# Patient Record
Sex: Female | Born: 1950
Health system: Southern US, Community
[De-identification: ages and names within clinical notes are randomized; demographics above are authoritative.]

## PROBLEM LIST (undated history)

## (undated) DIAGNOSIS — I2510 Atherosclerotic heart disease of native coronary artery without angina pectoris: Secondary | ICD-10-CM

## (undated) DIAGNOSIS — F329 Major depressive disorder, single episode, unspecified: Secondary | ICD-10-CM

## (undated) DIAGNOSIS — I10 Essential (primary) hypertension: Secondary | ICD-10-CM

## (undated) DIAGNOSIS — J449 Chronic obstructive pulmonary disease, unspecified: Secondary | ICD-10-CM

## (undated) DIAGNOSIS — K219 Gastro-esophageal reflux disease without esophagitis: Secondary | ICD-10-CM

## (undated) DIAGNOSIS — I509 Heart failure, unspecified: Secondary | ICD-10-CM

## (undated) DIAGNOSIS — R000 Tachycardia, unspecified: Secondary | ICD-10-CM

## (undated) DIAGNOSIS — F32A Depression, unspecified: Secondary | ICD-10-CM

## (undated) DIAGNOSIS — E1142 Type 2 diabetes mellitus with diabetic polyneuropathy: Secondary | ICD-10-CM

## (undated) DIAGNOSIS — E785 Hyperlipidemia, unspecified: Secondary | ICD-10-CM

## (undated) DIAGNOSIS — E7800 Pure hypercholesterolemia, unspecified: Secondary | ICD-10-CM

## (undated) DIAGNOSIS — R002 Palpitations: Secondary | ICD-10-CM

## (undated) DIAGNOSIS — R12 Heartburn: Secondary | ICD-10-CM

## (undated) DIAGNOSIS — F419 Anxiety disorder, unspecified: Secondary | ICD-10-CM

## (undated) DIAGNOSIS — G4733 Obstructive sleep apnea (adult) (pediatric): Secondary | ICD-10-CM

## (undated) DIAGNOSIS — M1990 Unspecified osteoarthritis, unspecified site: Secondary | ICD-10-CM

## (undated) DIAGNOSIS — G4730 Sleep apnea, unspecified: Secondary | ICD-10-CM

## (undated) DIAGNOSIS — R51 Headache: Secondary | ICD-10-CM

## (undated) DIAGNOSIS — I219 Acute myocardial infarction, unspecified: Secondary | ICD-10-CM

## (undated) DIAGNOSIS — Z86718 Personal history of other venous thrombosis and embolism: Secondary | ICD-10-CM

## (undated) HISTORY — DX: Palpitations: R00.2

## (undated) HISTORY — PX: ABDOMINAL HYSTERECTOMY: SHX81

## (undated) HISTORY — PX: OTHER SURGICAL HISTORY: SHX169

## (undated) HISTORY — PX: CORONARY ANGIOPLASTY WITH STENT PLACEMENT: SHX49

## (undated) HISTORY — DX: Depression, unspecified: F32.A

## (undated) HISTORY — DX: Atherosclerotic heart disease of native coronary artery without angina pectoris: I25.10

## (undated) HISTORY — DX: Heartburn: R12

## (undated) HISTORY — DX: Sleep apnea, unspecified: G47.30

## (undated) HISTORY — DX: Obstructive sleep apnea (adult) (pediatric): G47.33

## (undated) HISTORY — PX: CHOLECYSTECTOMY: SHX55

## (undated) HISTORY — DX: Pure hypercholesterolemia, unspecified: E78.00

## (undated) HISTORY — DX: Type 2 diabetes mellitus with diabetic polyneuropathy: E11.42

## (undated) HISTORY — DX: Acute myocardial infarction, unspecified: I21.9

## (undated) HISTORY — DX: Major depressive disorder, single episode, unspecified: F32.9

## (undated) HISTORY — DX: Hyperlipidemia, unspecified: E78.5

## (undated) HISTORY — DX: Anxiety disorder, unspecified: F41.9

## (undated) HISTORY — DX: Tachycardia, unspecified: R00.0

## (undated) HISTORY — DX: Personal history of other venous thrombosis and embolism: Z86.718

---

## 2003-11-02 ENCOUNTER — Ambulatory Visit (HOSPITAL_COMMUNITY): Admission: RE | Admit: 2003-11-02 | Discharge: 2003-11-02 | Payer: Self-pay | Admitting: Internal Medicine

## 2004-05-01 ENCOUNTER — Encounter: Admission: RE | Admit: 2004-05-01 | Discharge: 2004-05-01 | Payer: Self-pay | Admitting: Internal Medicine

## 2004-05-17 ENCOUNTER — Encounter: Admission: RE | Admit: 2004-05-17 | Discharge: 2004-05-17 | Payer: Self-pay | Admitting: Internal Medicine

## 2004-08-02 ENCOUNTER — Encounter: Admission: RE | Admit: 2004-08-02 | Discharge: 2004-08-02 | Payer: Self-pay | Admitting: Orthopedic Surgery

## 2004-08-08 ENCOUNTER — Ambulatory Visit (HOSPITAL_COMMUNITY): Admission: AD | Admit: 2004-08-08 | Discharge: 2004-08-10 | Payer: Self-pay | Admitting: Orthopedic Surgery

## 2004-08-14 ENCOUNTER — Encounter (HOSPITAL_COMMUNITY): Admission: RE | Admit: 2004-08-14 | Discharge: 2004-09-13 | Payer: Self-pay | Admitting: Orthopedic Surgery

## 2004-09-11 ENCOUNTER — Encounter (HOSPITAL_COMMUNITY): Admission: RE | Admit: 2004-09-11 | Discharge: 2004-10-11 | Payer: Self-pay | Admitting: Internal Medicine

## 2004-09-16 ENCOUNTER — Encounter (HOSPITAL_COMMUNITY): Admission: RE | Admit: 2004-09-16 | Discharge: 2004-10-16 | Payer: Self-pay | Admitting: Orthopedic Surgery

## 2004-10-11 ENCOUNTER — Encounter: Admission: RE | Admit: 2004-10-11 | Discharge: 2004-11-10 | Payer: Self-pay | Admitting: Internal Medicine

## 2004-10-18 ENCOUNTER — Encounter (HOSPITAL_COMMUNITY): Admission: RE | Admit: 2004-10-18 | Discharge: 2004-11-17 | Payer: Self-pay | Admitting: Orthopedic Surgery

## 2004-11-11 ENCOUNTER — Encounter (HOSPITAL_COMMUNITY): Admission: RE | Admit: 2004-11-11 | Discharge: 2004-12-11 | Payer: Self-pay | Admitting: Internal Medicine

## 2004-11-19 ENCOUNTER — Encounter (HOSPITAL_COMMUNITY): Admission: RE | Admit: 2004-11-19 | Discharge: 2004-12-14 | Payer: Self-pay | Admitting: Orthopedic Surgery

## 2004-12-16 ENCOUNTER — Encounter (HOSPITAL_COMMUNITY): Admission: RE | Admit: 2004-12-16 | Discharge: 2005-01-15 | Payer: Self-pay | Admitting: Internal Medicine

## 2004-12-16 ENCOUNTER — Encounter (HOSPITAL_COMMUNITY): Admission: RE | Admit: 2004-12-16 | Discharge: 2005-01-15 | Payer: Self-pay | Admitting: Orthopedic Surgery

## 2005-01-16 ENCOUNTER — Encounter (HOSPITAL_COMMUNITY): Admission: RE | Admit: 2005-01-16 | Discharge: 2005-02-15 | Payer: Self-pay | Admitting: Internal Medicine

## 2005-01-20 ENCOUNTER — Ambulatory Visit: Payer: Self-pay | Admitting: Internal Medicine

## 2005-01-20 ENCOUNTER — Encounter (HOSPITAL_COMMUNITY): Admission: RE | Admit: 2005-01-20 | Discharge: 2005-02-19 | Payer: Self-pay | Admitting: Orthopedic Surgery

## 2005-03-05 ENCOUNTER — Ambulatory Visit: Payer: Self-pay | Admitting: Internal Medicine

## 2005-04-16 ENCOUNTER — Ambulatory Visit: Payer: Self-pay | Admitting: Pain Medicine

## 2005-05-12 ENCOUNTER — Ambulatory Visit: Payer: Self-pay | Admitting: Pain Medicine

## 2005-06-24 ENCOUNTER — Ambulatory Visit: Payer: Self-pay | Admitting: Pain Medicine

## 2005-06-30 ENCOUNTER — Ambulatory Visit: Payer: Self-pay | Admitting: Internal Medicine

## 2005-07-15 ENCOUNTER — Ambulatory Visit: Payer: Self-pay | Admitting: Physician Assistant

## 2005-07-17 ENCOUNTER — Inpatient Hospital Stay (HOSPITAL_COMMUNITY): Admission: AD | Admit: 2005-07-17 | Discharge: 2005-07-18 | Payer: Self-pay | Admitting: Internal Medicine

## 2005-07-17 ENCOUNTER — Ambulatory Visit: Payer: Self-pay | Admitting: Internal Medicine

## 2005-07-17 ENCOUNTER — Ambulatory Visit: Payer: Self-pay | Admitting: Cardiology

## 2005-08-15 ENCOUNTER — Ambulatory Visit: Payer: Self-pay | Admitting: Physician Assistant

## 2005-08-28 ENCOUNTER — Ambulatory Visit: Payer: Self-pay | Admitting: Pain Medicine

## 2005-11-06 ENCOUNTER — Ambulatory Visit: Payer: Self-pay | Admitting: Internal Medicine

## 2005-12-29 ENCOUNTER — Ambulatory Visit: Payer: Self-pay | Admitting: Internal Medicine

## 2006-01-09 ENCOUNTER — Encounter (INDEPENDENT_AMBULATORY_CARE_PROVIDER_SITE_OTHER): Payer: Self-pay | Admitting: *Deleted

## 2006-01-09 ENCOUNTER — Ambulatory Visit: Payer: Self-pay | Admitting: Internal Medicine

## 2006-01-09 ENCOUNTER — Ambulatory Visit (HOSPITAL_COMMUNITY): Admission: RE | Admit: 2006-01-09 | Discharge: 2006-01-09 | Payer: Self-pay | Admitting: Internal Medicine

## 2006-03-10 ENCOUNTER — Ambulatory Visit: Payer: Self-pay | Admitting: Cardiology

## 2006-03-11 ENCOUNTER — Ambulatory Visit: Payer: Self-pay | Admitting: Cardiology

## 2006-03-11 ENCOUNTER — Inpatient Hospital Stay (HOSPITAL_COMMUNITY): Admission: AD | Admit: 2006-03-11 | Discharge: 2006-03-12 | Payer: Self-pay | Admitting: Cardiology

## 2006-04-10 ENCOUNTER — Ambulatory Visit: Payer: Self-pay | Admitting: Cardiology

## 2006-04-27 ENCOUNTER — Ambulatory Visit: Payer: Self-pay | Admitting: Cardiology

## 2006-05-20 ENCOUNTER — Ambulatory Visit: Payer: Self-pay | Admitting: Cardiology

## 2006-06-17 ENCOUNTER — Ambulatory Visit: Payer: Self-pay | Admitting: Physician Assistant

## 2006-08-18 ENCOUNTER — Ambulatory Visit: Payer: Self-pay | Admitting: Cardiology

## 2006-08-18 ENCOUNTER — Inpatient Hospital Stay (HOSPITAL_COMMUNITY): Admission: EM | Admit: 2006-08-18 | Discharge: 2006-08-20 | Payer: Self-pay | Admitting: Emergency Medicine

## 2006-09-03 ENCOUNTER — Ambulatory Visit: Payer: Self-pay | Admitting: Cardiology

## 2006-11-10 ENCOUNTER — Ambulatory Visit: Payer: Self-pay | Admitting: Cardiology

## 2006-11-24 ENCOUNTER — Ambulatory Visit: Payer: Self-pay | Admitting: Nurse Practitioner

## 2006-12-14 ENCOUNTER — Ambulatory Visit: Payer: Self-pay | Admitting: Cardiology

## 2006-12-15 ENCOUNTER — Encounter: Payer: Self-pay | Admitting: Cardiology

## 2007-01-04 ENCOUNTER — Encounter: Payer: Self-pay | Admitting: Physician Assistant

## 2007-01-20 ENCOUNTER — Ambulatory Visit: Payer: Self-pay | Admitting: Cardiology

## 2007-02-23 ENCOUNTER — Ambulatory Visit: Payer: Self-pay | Admitting: Cardiology

## 2007-02-23 ENCOUNTER — Encounter: Payer: Self-pay | Admitting: Physician Assistant

## 2007-04-29 ENCOUNTER — Inpatient Hospital Stay (HOSPITAL_COMMUNITY): Admission: RE | Admit: 2007-04-29 | Discharge: 2007-05-01 | Payer: Self-pay | Admitting: Orthopedic Surgery

## 2007-06-14 ENCOUNTER — Encounter: Admission: RE | Admit: 2007-06-14 | Discharge: 2007-07-22 | Payer: Self-pay | Admitting: Orthopedic Surgery

## 2007-07-15 ENCOUNTER — Ambulatory Visit: Payer: Self-pay | Admitting: Cardiology

## 2007-07-23 ENCOUNTER — Ambulatory Visit: Payer: Self-pay | Admitting: Cardiology

## 2007-07-23 ENCOUNTER — Encounter: Admission: RE | Admit: 2007-07-23 | Discharge: 2007-08-23 | Payer: Self-pay | Admitting: Orthopedic Surgery

## 2007-08-03 ENCOUNTER — Ambulatory Visit: Payer: Self-pay | Admitting: Cardiology

## 2007-10-06 ENCOUNTER — Encounter: Payer: Self-pay | Admitting: Cardiology

## 2007-10-11 ENCOUNTER — Encounter: Payer: Self-pay | Admitting: Cardiology

## 2007-12-06 ENCOUNTER — Encounter: Payer: Self-pay | Admitting: Cardiology

## 2008-05-04 ENCOUNTER — Ambulatory Visit: Payer: Self-pay | Admitting: Cardiology

## 2008-05-25 ENCOUNTER — Encounter: Payer: Self-pay | Admitting: Cardiology

## 2008-06-01 ENCOUNTER — Ambulatory Visit: Payer: Self-pay | Admitting: Cardiology

## 2008-11-08 DIAGNOSIS — R079 Chest pain, unspecified: Secondary | ICD-10-CM | POA: Insufficient documentation

## 2008-11-08 DIAGNOSIS — R0989 Other specified symptoms and signs involving the circulatory and respiratory systems: Secondary | ICD-10-CM | POA: Insufficient documentation

## 2008-11-08 DIAGNOSIS — R002 Palpitations: Secondary | ICD-10-CM | POA: Insufficient documentation

## 2008-11-08 DIAGNOSIS — I2510 Atherosclerotic heart disease of native coronary artery without angina pectoris: Secondary | ICD-10-CM | POA: Insufficient documentation

## 2008-11-09 ENCOUNTER — Encounter (INDEPENDENT_AMBULATORY_CARE_PROVIDER_SITE_OTHER): Payer: Self-pay | Admitting: *Deleted

## 2008-12-17 ENCOUNTER — Encounter: Payer: Self-pay | Admitting: Cardiology

## 2008-12-18 ENCOUNTER — Encounter: Payer: Self-pay | Admitting: Cardiology

## 2009-02-20 ENCOUNTER — Encounter: Payer: Self-pay | Admitting: Cardiology

## 2009-05-30 ENCOUNTER — Encounter: Payer: Self-pay | Admitting: Cardiology

## 2009-05-30 ENCOUNTER — Encounter: Payer: Self-pay | Admitting: Cardiovascular Disease

## 2009-07-18 ENCOUNTER — Encounter: Payer: Self-pay | Admitting: Cardiology

## 2009-07-26 ENCOUNTER — Encounter: Payer: Self-pay | Admitting: Cardiology

## 2009-08-30 ENCOUNTER — Encounter: Payer: Self-pay | Admitting: Cardiology

## 2009-08-30 ENCOUNTER — Telehealth (INDEPENDENT_AMBULATORY_CARE_PROVIDER_SITE_OTHER): Payer: Self-pay | Admitting: *Deleted

## 2009-09-04 ENCOUNTER — Encounter: Payer: Self-pay | Admitting: Physician Assistant

## 2009-09-04 ENCOUNTER — Ambulatory Visit: Payer: Self-pay | Admitting: Cardiology

## 2009-09-04 DIAGNOSIS — G4733 Obstructive sleep apnea (adult) (pediatric): Secondary | ICD-10-CM | POA: Insufficient documentation

## 2009-09-05 ENCOUNTER — Ambulatory Visit: Payer: Self-pay | Admitting: Cardiology

## 2009-09-05 ENCOUNTER — Encounter: Payer: Self-pay | Admitting: Cardiovascular Disease

## 2009-09-06 ENCOUNTER — Encounter: Payer: Self-pay | Admitting: Physician Assistant

## 2009-09-12 ENCOUNTER — Encounter: Payer: Self-pay | Admitting: Cardiology

## 2009-09-20 ENCOUNTER — Ambulatory Visit: Payer: Self-pay | Admitting: Physician Assistant

## 2009-09-20 DIAGNOSIS — E109 Type 1 diabetes mellitus without complications: Secondary | ICD-10-CM | POA: Insufficient documentation

## 2009-09-20 DIAGNOSIS — I5032 Chronic diastolic (congestive) heart failure: Secondary | ICD-10-CM | POA: Insufficient documentation

## 2009-09-20 DIAGNOSIS — E782 Mixed hyperlipidemia: Secondary | ICD-10-CM | POA: Insufficient documentation

## 2010-02-19 ENCOUNTER — Encounter: Payer: Self-pay | Admitting: Cardiology

## 2010-04-07 ENCOUNTER — Encounter: Payer: Self-pay | Admitting: Internal Medicine

## 2010-04-11 ENCOUNTER — Encounter
Admission: RE | Admit: 2010-04-11 | Discharge: 2010-04-16 | Payer: Self-pay | Source: Home / Self Care | Attending: Physical Medicine & Rehabilitation | Admitting: Physical Medicine & Rehabilitation

## 2010-04-15 ENCOUNTER — Ambulatory Visit
Admission: RE | Admit: 2010-04-15 | Discharge: 2010-04-15 | Payer: Self-pay | Source: Home / Self Care | Attending: Physical Medicine & Rehabilitation | Admitting: Physical Medicine & Rehabilitation

## 2010-04-17 ENCOUNTER — Telehealth (INDEPENDENT_AMBULATORY_CARE_PROVIDER_SITE_OTHER): Payer: Self-pay | Admitting: *Deleted

## 2010-04-17 NOTE — Letter (Signed)
Summary: [facility] D/C DR. [name]  [facility] D/C DR. [name]   Imported By: Zachary George 09/20/2009 12:55:59  _____________________________________________________________________  External Attachment:    Type:   Image     Comment:   External Document

## 2010-04-17 NOTE — Progress Notes (Signed)
Summary: change crestor to pravastatin  Phone Note Outgoing Call Call back at [facility] Phone [phone]   Call placed by: Carlye Grippe,  August 30, 2009 9:57 AM Call placed to: Patient Summary of Call: called and informed patient due to insurance requiring PA on crestor, MD Degent changed crestor 20mg  to pravastatin 80mg  Take 1 tablet by mouth once a day. New rx sent to [facility].  Initial call taken by: Carlye Grippe,  August 30, 2009 9:59 AM    New/Updated Medications: PRAVASTATIN SODIUM 80 MG TABS (PRAVASTATIN SODIUM) Take 1 tablet by mouth once a day Prescriptions: PRAVASTATIN SODIUM 80 MG TABS (PRAVASTATIN SODIUM) Take 1 tablet by mouth once a day  #30 x 6   Entered by:   Carlye Grippe   Authorized by:   Lewayne Bunting, MD, [facility]   Signed by:   Carlye Grippe on 08/30/2009   Method used:   Electronically to        [facility]* (retail)       [address]       [place], [place]  [address]       Ph: [phone]       Fax: [phone]   RxID:   [id number]

## 2010-04-17 NOTE — Consult Note (Signed)
Summary: CARDIOLOGY CONSULT/ [facility]  CARDIOLOGY CONSULT/ [facility]   Imported By: Zachary George 09/20/2009 12:54:58  _____________________________________________________________________  External Attachment:    Type:   Image     Comment:   External Document

## 2010-04-17 NOTE — Letter (Signed)
Summary: [facility] H[facility]P/ D/C DR. DHRUV [name]  [facility] H[facility]P/ D/C DR. DHRUV [name]   Imported By: Delfino Lovett 06/04/2009 11:00:36  _____________________________________________________________________  External Attachment:    Type:   Image     Comment:   External Document

## 2010-04-17 NOTE — Letter (Signed)
Summary: Appointment -missed  [facility]. [address] [place], [place] [address]   Phone: [phone]  Fax: [phone]     Jul 18, 2009 MRN: [id number]     [place], [place]  [address]     Dear Ms. ORAVEC,  [facility] records indicate you missed your appointment on Jul 18, 2009                        with Dr.  Dannielle Burn.   It is very important that we reach you to reschedule this appointment. We look forward to participating in your health care needs.   Please contact us at the number listed above at your earliest convenience to reschedule this appointment.   Sincerely,    Public relations account executive

## 2010-04-17 NOTE — Medication Information (Signed)
Summary: RX Folder/ [facility]  RX Folder/ [facility]   Imported By: Bartholomew Boards 08/30/2009 11:15:08  _____________________________________________________________________  External Attachment:    Type:   Image     Comment:   External Document

## 2010-04-17 NOTE — Assessment & Plan Note (Signed)
Summary: 6 MONTH FU   Visit Type:  Follow-up Primary Provider:  Woody Seller   History of Present Illness: the patient is a 60 year old female with insulin-dependent diabetes mellitus, dyslipidemia, fibromyalgia, obstructive sleep apnea and anxiety and depression.  The patient does have a history of coronary artery disease. She had a widely patent circumflex stent by catheterization in June of 2008. She has a possible history of coronary vasospasm. Her circumflex coronary artery stent was a Taxus stent placed in the setting of a non-ST elevation myocardial infarction. She also has a history of sinus tachycardia. She has a history of atypical chest pains which in the past have been felt to be secondary to reflux. She had a normal adenosine stress Cardiolite study a year ago with an ejection fraction of 59%. She has a long-standing history of palpitations.  The patient now reports over the last month or so she has felt a burning sensation in the chest particularly at night. However she also reports left-sided chest pain associated with diaphoresis and shortness of breath upon exertion. She has taken several nitroglycerin over the last week with improvement in her symptoms. Interestingly however on palpation of the left chest underneath her left breast the patient has tenderness. She also feels she has gained quite a bit of fluids. She has gained 9 pounds in the last week despite the fact that she takes diuretics. She feels extremely short of breath on minimal exertion with immediate onset of central chest pain. She gets dizzy when she gets up quickly.  The patient was very tachycardic in the office and I did her lung exam she started hyperventilating. We placed a nonrebreather mask on the patient's and her symptoms improved. Her EKG showed no acute ischemic changesand there was sinus tachycardia. Her pulse oximeter was within normal limits and she was hypertensive. The patient was given consecutively 2 doses of  nitroglycerin and 2 doses of p.o. metoprolol 25 mg.  According toher primary care physician the patient has cor pulmonale but this has never been documented  Preventive Screening-Counseling & Management  Alcohol-Tobacco     Smoking Status: never  Current Medications (verified): 1)  Diltiazem Hcl Er Beads 180 Mg Xr24h-Cap (Diltiazem Hcl Er Beads) .... Take One Capsule By Mouth Daily 2)  Omeprazole 20 Mg Cpdr (Omeprazole) .... Take 1 Tablet By Mouth Two Times A Day 3)  Potassium Chloride Cr 10 Meq Cr-Caps (Potassium Chloride) .... Take 1 Tablet By Mouth Two Times A Day 4)  Cymbalta 60 Mg Cpep (Duloxetine Hcl) .... Take 1 Tablet By Mouth Two Times A Day 5)  Aspirin 81 Mg Tbec (Aspirin) .... Take One Tablet By Mouth Daily 6)  Pravastatin Sodium 80 Mg Tabs (Pravastatin Sodium) .... Take 1 Tablet By Mouth Once A Day 7)  Plavix 75 Mg Tabs (Clopidogrel Bisulfate) .... Take One Tablet By Mouth Daily 8)  Creon 24000 Unit Cpep (Pancrelipase (Lip-Prot-Amyl)) .... Take 2 Tablet By Mouth Two Times A Day 9)  Alprazolam 1 Mg Tabs (Alprazolam) .... Take One Tablet Four Times A Day 10)  Premarin 1.25 Mg Tabs (Estrogens Conjugated) .... Take One Tablet By Mouth Once Daily 11)  Benazepril Hcl 20 Mg Tabs (Benazepril Hcl) .... Take 1 Tablet By Mouth Once A Day 12)  Zoloft 100 Mg Tabs (Sertraline Hcl) .... Take 1 Tablet By Mouth Once A Day 13)  Morphine Sulfate Cr 60 Mg Xr12h-Tab (Morphine Sulfate) .... Take 1 Tablet By Mouth Two Times A Day 14)  Hydrocodone-Acetaminophen 10-500 Mg Tabs (Hydrocodone-Acetaminophen) .Marland KitchenMarland KitchenMarland Kitchen  Take One Tablet By Mouth Four Times A Day 15)  Promethazine Hcl 25 Mg Tabs (Promethazine Hcl) .... As Needed 16)  Humalog 100 Unit/ml Soln (Insulin Lispro (Human)) .... Ss Before Meals As Needed 17)  Nitrostat 0.4 Mg Subl (Nitroglycerin) .... Dissolve One Under Tongue As Directed 18)  Torsemide 20 Mg Tabs (Torsemide) .... Take 2 Tablet By Mouth Two Times A Day 19)  Cyanocobalamin 1000 Mcg/ml Soln  (Cyanocobalamin) .... Monthly 20)  Lantus Solostar 100 Unit/ml Soln (Insulin Glargine) .... Use As Directed 21)  Lidocaine 5 % Oint (Lidocaine) .... Use As Directed  Allergies (verified): 1)  ! * Lyrica 2)  ! * Adderall 3)  ! * Verelan 4)  ! * Topamax  Comments:  Nurse/Medical Assistant: The patient's medication list and allergies were reviewed with the patient and were updated in the Medication and Allergy Lists.   Past History:  Past Medical History: Last updated: 11/08/2008 PALPITATIONS (ICD-785.1) TACHYCARDIA (ICD-785) CAD, NATIVE VESSEL (ICD-414.01) CHEST PAIN-UNSPECIFIED (ICD-786.50) Insulin-dependent diabetes mellitus.  Dyslipidemia.  Anxiety and depression.  Obstructive sleep apnea  Past Surgical History: Last updated: 11/08/2008 R hand  Family History: Last updated: 11/08/2008 Family History of Cancer:  Family History of Coronary Artery Disease:   Social History: Last updated: 11/08/2008 Disabled  Married  Tobacco Use - No.  Alcohol Use - no Regular Exercise - no Drug Use - no  Risk Factors: Smoking Status: never (09/04/2009)  Review of Systems       The patient complains of weight gain/loss, chest pain, palpitations, shortness of breath, sleep apnea, leg swelling, dizziness, depression, and anxiety.  The patient denies fatigue, malaise, fever, vision loss, decreased hearing, hoarseness, prolonged cough, wheezing, coughing up blood, abdominal pain, blood in stool, nausea, vomiting, diarrhea, heartburn, incontinence, blood in urine, muscle weakness, joint pain, rash, skin lesions, headache, fainting, enlarged lymph nodes, easy bruising or bleeding, and environmental allergies.    Vital Signs:  Patient profile:   60 year old female Height:      65 inches Weight:      254 pounds BMI:     42.42 O2 Sat:      95 % on Room air Pulse rate:   116 / minute BP sitting:   114 / 75  (left arm) Cuff size:   large  Vitals Entered By: Georgina Peer (September 04, 2009 10:46 AM)  Nutrition Counseling: Patient's BMI is greater than 25 and therefore counseled on weight management options.  O2 Flow:  Room air  Serial Vital Signs/Assessments:  Time      Position  BP       Pulse  Resp  Temp     By 11:15 AM            150/92   122                   Gurney Maxin, RN, BSN                     96/64    [address] LPN 79:39 AM            103/69   [address] LPN  PEF    PreRx  PostRx Time      O2 Sat  O2 Type     L/min  L/min  L/min   By 11:15 AM  96  %   Room air                          Gurney Maxin, RN, BSN           95  %   Room air                          [place]y Rose, LPN 62:69 AM  97  %   Room air                          [place], LPN  Comments: vitals done at 11:28  By: Lovina Reach, LPN  48:54 AM chest pain rated 7/10.  Dr. Dannielle Burn aware. By: Lovina Reach, LPN    Physical Exam  Additional Exam:  General: Well-developed, acute neck head: Normocephalic and atraumatic eyes PERRLA/EOMI intact, conjunctiva and lids normal nose: No deformity or lesions mouth normal dentition, normal posterior pharynx neck: Supple, 8 cm JVD.  No masses, thyromegaly or abnormal cervical nodes lungs: Normal breath sounds bilaterally without wheezing.  Normal percussion heart: regular rate and rhythm with normal S1 and S2, no S3 or S4.  PMI is normal.  No pathological murmurs. Tachycardic abdomen: Normal bowel sounds, abdomen is soft and nontender without masses, organomegaly or hernias noted.  No hepatosplenomegaly musculoskeletal: Back normal, normal gait muscle strength and tone normal pulsus: Pulse is normal in all 4 extremities Extremities: 2+ peripheral pitting edema neurologic: Alert and oriented x 3 skin: Intact without lesions or rashes cervical nodes: No significant adenopathy psychologic: Normal affect    EKG  Procedure date:  09/04/2009  Findings:      sinus  tachycardia. Heart rate 118 beats per minute. Otherwise normal EKG  Impression & Recommendations:  Problem # 1:  CAD, NATIVE VESSEL (ICD-414.01) the patient has substernal chest pain which is concerning for angina. However there were no EKG changes. We will admit the patient for observation and rule out for myocardial infarction. There are significant atypical features to her chest pain. However, the patient may require a diagnostic cardiac catheterization as she is a diabetic and may present with atypical symptoms. In particular if her troponins are slightly positive I would surely proceed with a diagnostic cardiac catheterization. Given her history of shortness of breath and a questionable history of cor pulmonale and obstructive sleep apnea the patient would also benefit from a right heart catheterization. Her updated medication list for this problem includes:    Diltiazem Hcl Er Beads 180 Mg Xr24h-cap (Diltiazem hcl er beads) .Marland Kitchen... Take one capsule by mouth daily    Aspirin 81 Mg Tbec (Aspirin) .Marland Kitchen... Take one tablet by mouth daily    Plavix 75 Mg Tabs (Clopidogrel bisulfate) .Marland Kitchen... Take one tablet by mouth daily    Benazepril Hcl 20 Mg Tabs (Benazepril hcl) .Marland Kitchen... Take 1 tablet by mouth once a day    Nitrostat 0.4 Mg Subl (Nitroglycerin) .Marland Kitchen... Dissolve one under tongue as directed  Problem # 2:  TACHYCARDIA (ICD-785) I suspect the patient had somewhat of anxiety response to her hyperventilation and chest pain. She was given metoprolol and this will be started at low dose.  Problem # 3:  CHF (ICD-428.0)  the patient is volume overloaded and we will order a BNP level. She'll also get an echocardiogram to reassess her ejection fraction. She may require additional Lasix later today. The following medications were removed from the medication list:    Furosemide 40 Mg Tabs (Furosemide) .Marland Kitchen... Take two tablet by mouth daily. Her updated medication list for this problem includes:    Diltiazem Hcl Er  Beads 180 Mg Xr24h-cap (Diltiazem hcl er beads) .Marland Kitchen... Take one capsule by mouth daily    Aspirin 81 Mg Tbec (Aspirin) .Marland Kitchen... Take one tablet by mouth daily    Plavix 75 Mg Tabs (Clopidogrel bisulfate) .Marland Kitchen... Take one tablet by mouth daily    Benazepril Hcl 20 Mg Tabs (Benazepril hcl) .Marland Kitchen... Take 1 tablet by mouth once a day    Nitrostat 0.4 Mg Subl (Nitroglycerin) .Marland Kitchen... Dissolve one under tongue as directed    Torsemide 20 Mg Tabs (Torsemide) .Marland Kitchen... Take 2 tablet by mouth two times a day  Problem # 4:  OBSTRUCTIVE SLEEP APNEA (ICD-327.23) Assessment: Comment Only   Medication Administration  Medication # 1:    Medication: NTG 1/150 gr tab    Diagnosis: CHEST PAIN-UNSPECIFIED (ICD-786.50)    Dose: 1tablets    Route: SL    Exp Date: 08/16/2010    Lot #: [id number]    Mfr: Parke-Davis    Comments: Pain 7/10 Pt. rate pain 6/10.  2nd dose of NTG given per Dr. Dannielle Burn. Lovina Reach, LPN  September 04, 5945 07:61 AM     Patient tolerated medication without complications    Given by: Gurney Maxin, RN, BSN (September 04, 2009 11:15 AM)  Medication # 2:    Medication: Metoprolol Tart     Diagnosis: TACHYCARDIA (ICD-785)    Dose: 34m    Route: po    Exp Date: 12/2009    Lot #: [id number]   Mfr: UDL    Comments: Pt.'s heart rate 120.  2nd dose of Metoprolol given per Dr. DDannielle Burn GLovina Reach LPN  June 21, [id number]:[id number]     Patient tolerated medication without complications    Given by: JGurney Maxin RN, BSN (September 04, 2009 11:17 AM)

## 2010-04-17 NOTE — Assessment & Plan Note (Signed)
SummaryChauncy Lean [facility] 6/[facility]   Visit Type:  hospital follow-up Primary Provider:  Sherril Croon   History of Present Illness: patient presents for post hospital followup.  She was recently directly admitted from here, by Dr. Andee Lineman, for evaluation of symptoms worrisome for unstable angina. She ruled out with normal cardiac markers. A 2-D echo indicated normal LVF (EF 50-55%), with no significant valvular abnormalities. Dr. Andee Lineman attributed her symptoms to anxiety disorder.  Patient also demonstrated no objective evidence of CHF, either by chest x-ray or BNP level.  Clinically, she feels better. She's not having any chest pain. She reports significant improvement in her peripheral edema. She has lost 5 pounds since her last office visit.  Preventive Screening-Counseling & Management  Alcohol-Tobacco     Smoking Status: never  Current Medications (verified): 1)  Diltiazem Hcl Er Beads 180 Mg Xr24h-Cap (Diltiazem Hcl Er Beads) .... Take One Capsule By Mouth Daily 2)  Omeprazole 20 Mg Cpdr (Omeprazole) .... Take 1 Tablet By Mouth Two Times A Day 3)  Potassium Chloride Cr 10 Meq Cr-Caps (Potassium Chloride) .... Take 1 Tablet By Mouth Two Times A Day 4)  Cymbalta 60 Mg Cpep (Duloxetine Hcl) .... Take 1 Tablet By Mouth Two Times A Day 5)  Aspirin 81 Mg Tbec (Aspirin) .... Take One Tablet By Mouth Daily 6)  Pravastatin Sodium 80 Mg Tabs (Pravastatin Sodium) .... Take 1 Tablet By Mouth Once A Day 7)  Plavix 75 Mg Tabs (Clopidogrel Bisulfate) .... Take One Tablet By Mouth Daily 8)  Creon 24000 Unit Cpep (Pancrelipase (Lip-Prot-Amyl)) .... Take 2 Tablet By Mouth Two Times A Day 9)  Alprazolam 1 Mg Tabs (Alprazolam) .... Take One Tablet Four Times A Day 10)  Premarin 1.25 Mg Tabs (Estrogens Conjugated) .... Take One Tablet By Mouth Once Daily 11)  Benazepril Hcl 20 Mg Tabs (Benazepril Hcl) .... Take 1 Tablet By Mouth Once A Day 12)  Zoloft 100 Mg Tabs (Sertraline Hcl) .... Take 1 Tablet By Mouth  Once A Day 13)  Morphine Sulfate Cr 60 Mg Xr12h-Tab (Morphine Sulfate) .... Take 1 Tablet By Mouth Two Times A Day 14)  Hydrocodone-Acetaminophen 10-500 Mg Tabs (Hydrocodone-Acetaminophen) .... Take One Tablet By Mouth Four Times A Day 15)  Promethazine Hcl 25 Mg Tabs (Promethazine Hcl) .... As Needed 16)  Humalog 100 Unit/ml Soln (Insulin Lispro (Human)) .... Ss Before Meals As Needed 17)  Nitrostat 0.4 Mg Subl (Nitroglycerin) .... Dissolve One Under Tongue As Directed 18)  Torsemide 20 Mg Tabs (Torsemide) .... Take 2 Tablet By Mouth Two Times A Day 19)  Cyanocobalamin 1000 Mcg/ml Soln (Cyanocobalamin) .... Monthly 20)  Lantus Solostar 100 Unit/ml Soln (Insulin Glargine) .... Use As Directed 21)  Lidocaine 5 % Oint (Lidocaine) .... Use As Directed  Allergies (verified): 1)  ! * Lyrica 2)  ! * Adderall 3)  ! * Verelan 4)  ! * Topamax  Comments:  Nurse/Medical Assistant: The patient's medication list and allergies were reviewed with the patient and were updated in the Medication and Allergy Lists.  Past History:  Past Medical History: Last updated: 11/08/2008 PALPITATIONS (ICD-785.1) TACHYCARDIA (ICD-785) CAD, NATIVE VESSEL (ICD-414.01) CHEST PAIN-UNSPECIFIED (ICD-786.50) Insulin-dependent diabetes mellitus.  Dyslipidemia.  Anxiety and depression.  Obstructive sleep apnea  Review of Systems       No fevers, chills, hemoptysis, dysphagia, melena, hematocheezia, hematuria, rash, claudication, orthopnea, pnd. All other systems negative.   Vital Signs:  Patient profile:   60 year old female Height:  65 inches Weight:      249 pounds Pulse rate:   108 / minute BP sitting:   103 / 68  (left arm) Cuff size:   large  Vitals Entered By: Carlye Grippe (September 20, 2009 1:42 PM)  Physical Exam  Additional Exam:  GEN: 60 year old female, morbidly obese, sitting upright, no distress HEENT: NCAT,PERRLA,EOMI NECK: palpable pulses, no bruits; no JVD; no TM LUNGS: CTA  bilaterally HEART: RRR (S1S2); no significant murmurs; no rubs; no gallops ABD: soft, NT; intact BS EXT: intact distal pulses; one plus, bilateral pitting edema SKIN: warm, dry MUSC: no obvious deformity NEURO: A/O (x3)     Impression & Recommendations:  Problem # 1:  CHRONIC DIASTOLIC HEART FAILURE (ICD-428.32)  patient appears euvolemic, and reports significant improvement in her peripheral edema. She has lost 5 pounds since last office visit. She is on torsemide, with supplemental potassium, and is due for post hospital blood work tomorrow, with Dr. Sherril Croon. A recent echocardiogram indicated normal LVF (EF 50-50%), with no significant valvular abnormalities. Will schedule a return clinic visit with Dr. Andee Lineman in 3 months, or sooner as needed.  Problem # 2:  CAD, NATIVE VESSEL (ICD-414.01)  patient is status post recent brief hospitalization here for evaluation of angina pectoris. Serial cardiac markers were all within normal limits. Her symptoms were attributed to anxiety disorder. No further workup indicated.  Problem # 3:  IDDM (ICD-250.01) Assessment: Comment Only  Problem # 4:  DYSLIPIDEMIA (ICD-272.4)  aggressive management recommended, with target LDL of 70 or less, if feasible. Continue high-dose pravastatin. Will need surveillance fasting lipid/liver profile, if none in recent past.  Patient Instructions: 1)  Your physician wants you to follow-up in: 3 months. You will receive a reminder letter in the mail one-two months in advance. If you don't receive a letter, please call our office to schedule the follow-up appointment. 2)  Your physician recommends that you continue on your current medications as directed. Please refer to the Current Medication list given to you today.

## 2010-04-18 NOTE — Letter (Signed)
Summary: Internal Correspondence/ [facility]  Internal Correspondence/ [facility]   Imported By: Bartholomew Boards 03/14/2010 16:56:20  _____________________________________________________________________  External Attachment:    Type:   Image     Comment:   External Document

## 2010-04-24 NOTE — Progress Notes (Signed)
Summary: hold plavix  Phone Note Other Incoming   Caller: FAX FROM DR[facility]REW [facility]IRSTEINS [facility]  Summary of Call: Request to hold Plavix 5 days prior to spinal injection.  Scheduled for 2/27.  Not on coumadin.   Initial call taken by: Hoover Brunette, LPN,  April 17, 2010 11:48 AM  Follow-up for Phone Call        Plavix should be held 7 days prior to the spinal injection. Restart per the discretion of the anesthesiologist after injection as soon as is feasible Follow-up by: Lewayne Bunting, MD, [facility],  April 18, 2010 7:47 AM  Additional Follow-up for Phone Call Additional follow up Details #1::        Will fax back to above.  Additional Follow-up by: Hoover Brunette, LPN,  April 18, 2010 1:50 PM

## 2010-05-13 ENCOUNTER — Encounter: Payer: Self-pay | Admitting: Physical Medicine & Rehabilitation

## 2010-07-04 ENCOUNTER — Other Ambulatory Visit: Payer: Self-pay | Admitting: *Deleted

## 2010-07-04 MED ORDER — PRAVASTATIN SODIUM 80 MG PO TABS: 80.0000 mg | ORAL_TABLET | Freq: Every day | ORAL | Status: DC

## 2010-07-29 ENCOUNTER — Other Ambulatory Visit: Payer: Self-pay | Admitting: *Deleted

## 2010-07-29 MED ORDER — PRAVASTATIN SODIUM 80 MG PO TABS: 80.0000 mg | ORAL_TABLET | Freq: Every day | ORAL | Status: DC

## 2010-07-29 NOTE — Telephone Encounter (Signed)
Message left on nurse's voicemail that patient has appointment set up to see Degent on July 31st 2012 and needed refill on her cholesterol medications. Nurse sent refill to [facility] per patient request. Patient also need cholesterol labs.

## 2010-07-30 NOTE — Assessment & Plan Note (Signed)
[name] OFFICE NOTE   [name], [name]                       MRN:          [id number]  DATE:06/01/2008                            DOB:          06-30-1950    HISTORY OF PRESENT ILLNESS:  The patient is a very pleasant 60 year old  female with a history of nonobstructive coronary artery disease.  The  patient is status post Taxus stenting for distal circumflex in 2007.  Her recent catheterization in June 2008 was nonobstructive.  She has had  some problems with atypical chest pain.  They finally seem to be have  resolved as she had been put on Kapidex.  It appears that she had  significant reflux.  She denies any chest pain, shortness of breath,  orthopnea, or PND.  Unfortunately, she was put on arthritis medications  and had significant fluid retention.  Dr. Woody Seller increased her Lasix.  Next from a cardiac standpoint, however, she is stable.   MEDICATIONS:  1. Diltiazem ER 80 mg p.o. q.a.m.  2. Levemir.  3. Kapidex 60 mg a day.  4. Frusemide 80 mg in the morning.  5. Alprazolam 1 mg 4 times a day.  6. Potassium.  7. Cymbalta 60 mg p.o. b.i.d.  8. Premarin 1.25 mg q.a.m.  9. Neurontin 800 mg p.o. t.i.d.  10.Benazepril 20 mg p.o. q.p.m.  11.Plavix 75 mg p.o. q.a.m.  12.Crestor 20 mg p.o. q.a.m.  13.Aspirin 81 mg p.o. q.a.m.  14.Sertraline 50 mg p.o. nightly.  15.Morphine sulfate ER 60 mg p.o. b.i.d.  16.Hydrocodone 1 tablet p.o. q.i.d.  17.Hydroxyzine 25 mg 2 in the morning.   PHYSICAL EXAMINATION:  VITAL SIGNS:  Blood pressure is 100/70, heart  rate 99 beats per minute, and weight is 255 pounds.  NECK:  Normal carotid upstroke and no carotid bruit.  LUNGS:  Clear breath sounds bilaterally.  HEART:  Regular rate and rhythm with normal S1 and S2.  No murmurs,  rubs, or gallops.  ABDOMEN:  Soft and nontender.  No rebound or guarding.  Good bowel  sounds.  EXTREMITIES:  No cyanosis, clubbing, or  edema.  NEUROLOGIC:  The patient is alert, oriented, grossly nonfocal.   PROBLEMS:  1. Atypical chest pain, likely related to reflux.  2. Normal adenosine stress Cardiolite study, ejection fraction 59% a      year ago.  3. Nonobstructive coronary artery disease.  Widely patent circumflex      done by catheterization in June 2008.  4. Questionable coronary vasospasm.  5. Status post non-ST elevation myocardial infarction and Taxus stent      to distal circumflex in December 2007.  6. Sinus tachycardia, resolved.  7. Negative for pulmonary embolism.  8. Longstanding palpitations.  9. History of esophageal stricture.  10.Insulin-dependent diabetes mellitus.  11.Dyslipidemia.  12.Anxiety and depression.  13.Obstructive sleep apnea.   PLAN:  1. The patient is doing well.  She has much improved with Kapidex.  2. She does not need further cardiac workup.  3. The patient can follow up with Dr. Woody Seller.     Ernestine Mcmurray,  MD,FACC  Electronically Signed    GED/MedQ  DD: 06/01/2008  DT: 06/02/2008  Job #: [id number]   cc:   Jerene Bears, MD

## 2010-07-30 NOTE — Op Note (Signed)
NAMEHELYN, SCHWAN NO.:  [id number]   MEDICAL RECORD NO.:  [id number]          PATIENT TYPE:  INP   LOCATION:  5010                         FACILITY:  [facility]   PHYSICIAN:  Satira Anis. Gramig III, M.D.DATE OF BIRTH:  Feb 02, 1951   DATE OF PROCEDURE:  04/29/2007  DATE OF DISCHARGE:  05/01/2007                               OPERATIVE REPORT   Brittany Osier is a late dictation; the first one was lost.   PREOPERATIVE DIAGNOSES:  Ulnocarpal abutment, left wrist forearm with  TFC tearing.   POSTOPERATIVE DIAGNOSIS:  Ulnocarpal abutment, left wrist forearm with  TFC tearing.   PROCEDURES:  1. Ulnar shortening osteotomy, left forearm.  2. Stress radiography.  3. Arthroscopy with TFC debridement, left wrist.   SURGEON:  Satira Anis. Amedeo Plenty, M.D.   ASSISTANT:  Avelina Laine, P.A.-C.   COMPLICATIONS:  None.   ANESTHESIA:  General.   TOURNIQUET TIME:  One hour.   INDICATIONS FOR PROCEDURE:  Ms. Titus is a 60 year old female who  presents with the above-mentioned diagnosis.  I have counseled her in  regards to risks and benefits of surgery, including risk of infection,  bleeding and anesthesia, damage to normal structures and failure of  surgery to accomplish its intended goals of relieving symptoms and  restoring function.  With this in mind, she desires to proceed.  All  questions have been encouraged and answered preoperatively.   OPERATIVE PROCEDURE:  The patient was seen by myself and anesthesia;  taken to the operating room suite.  She underwent a smooth induction of  general anesthesia.  Permit was signed.  Preoperative antibiotics given  and full counseling was performed.  Following this, I then performed a  very careful and cautious evaluation under anesthesia.  She was fairly  stable ligamentously, bit did have the findings of ulnocarpal abutment  of course.  She was prepped and draped in usual sterile fashion.  Following this, she underwent placement  of the forearm in fingertrap  traction.  I then performed the incision under 250 mmHg of tourniquet  control about the subcutaneous border of her ulnar.  She, of course, had  sterile prep and drape prior to doing so.  Once this was complete and  the incision was made, we then dissected down to the subcutaneous border  of the ulna, utilizing sharp and blunt dissection tools.  Once this  done, the osteotomy device for the Rayhak osteotomy device was placed.  We initially placed the device about holes 1, 2, 3 and 4 in place.  Following this, we performed an osteotomy through the appropriate guide.  The distal cut was made first, followed by the proximal cut with  oscillating saw.  Irrigant was placed to avoid burning of the bone.  Following this, the osteotomy cut guide was removed.  I then performed  placement of the compression device with the plate, which was bent.  We  prebent her plate, and then applied it in standard technique according  to the Rayhak osteotomy guide.  This was applied without difficulty.  Compression was achieved about the osteotomy site.  Following this, the  additional plate fixation was achieved without difficulty.  This  provided an excellent shortening and gave her quite a bit of room back  about the ulnar aspect of her wrist.  The patient had improved  radiographic parameters and had great cortical impaction about the  osteotomy site.  I was very pleased with this.  Following this, I  introduced the arthroscope into the 3-4 portal about her wrist; made a  6R working portal and 6U outflow portal.  Through a combination of  portals. we of course performed a TFC debridement and synovectomy.  This  was performed without difficulty.  I drug an arthroscopic shaver and  arthroscopic wand used to perform synovectomy and TFC debridement of a  central tear.  This was debrided back to a stable placed without  difficulty.  Her styloid and her lunate fossa looked to be in  good  position.  I was pleased with this and the findings.   Following this, we removed the arthroscope, deflated the tourniquet,  attained hemostasis with bipolar cautery -- as we did during the initial  dissection.  We then closed the wound with Vicryl, followed by  subcuticular Prolene at the osteotomy incision site, and interrupted  Prolene at the portal placement sites in the wrist.  The patient looked  excellent.  Stress radiography revealed excellent position of the plate  and screw.  The osteotomy and the ulnar variance was returned in  neutral, which looked excellent compared to her preoperative status.  I  was pleased with the findings.  She was placed in a sterile dressing and  a plaster splint; and will be admitted for IV antibiotics, pain  management and general postoperative measures.   Thus, the patient underwent ulnar shortening osteotomy utilizing the  Rayhak device; as well as TFC debridement and stress radiography.  There  were no complicating features with her surgery.  We will look forward to  participating in her postoperative care.           ______________________________  Satira Anis. Blanchie Dessert, M.D.     Sampson Si  D:  05/14/2007  T:  05/15/2007  Job:  [id number]

## 2010-07-30 NOTE — Assessment & Plan Note (Signed)
[facility] OFFICE NOTE   CHAUNTA, BEJARANO                       MRN:          [id number]  DATE:08/03/2007                            DOB:          07-Jul-1950    PRIMARY CARDIOLOGIST:  Ernestine Mcmurray, MD.   REASON FOR VISIT:  Scheduled followup.  Please refer to my recent office  note of July 15, 2007, for full details.   HISTORY:  The patient returns in followup to discuss results of a recent  adenosine stress Cardiolite as well as any current findings from her  heart monitor.  As I had previously mentioned, I felt that her chest  pain symptoms were quite atypical, but that we should keep a low  threshold for a cardiac catheterization in the event of any suggestion  of ischemia by perfusion imaging.  I also put her on Imdur 15 daily for  treatment of possible coronary vasospasm.  The patient informs me today  that she was only able to take the 15 mg of Imdur for about 3-4 days,  before stopping secondary to severe headache.  She did not feel that the  Imdur helped her at all with respect to her recurrent, unpredictable  chest pain.   I reviewed the results of the adenosine stress Cardiolite with her,  which revealed completely normal perfusion and a calculated ejection  fraction of 59%.  Initial rhythm strips from her previous heart monitor  indicate NSR.  This was just placed 9 days ago.  She denies any increase  in frequency of her occasional palpitations.   Patient is suggesting that she thinks these symptoms are related to  considerable stress.  She referred to experiencing significant  depression and, in fact, has been evaluated for this by a psychiatrist  in the past.  The patient denies any recent presyncope/syncope or  intermittent dizziness.   IMPRESSION:  1. Morphine 60 mg t.i.d.  2. Hydrocodone 10/335 t.i.d.  3. Levemir 52 units b.i.d.  4. Humalog sliding scale.  5. Lasix 40 b.i.d.  6.  Alprazolam 1 mg t.i.d.  7. Benazepril 20 daily.  8. Aspirin 81 daily.  9. Diltiazem ER 180.  10.Prevacid 30 b.i.d.  11.Pangestyme EC t.i.d.  12.Potassium 10 mg q.a.m. and 5 mg q.p.m.  13.Cymbalta 60 mg b.i.d.  14.Premarin 1.25 daily.  15.Plavix 75 daily.  16.Crestor 20 daily.   PHYSICAL EXAMINATION:  VITAL SIGNS:  Blood pressure 114/75, pulse 92,  regular, weight 239.8 (unchanged).  GENERAL:  A 60 year old female, morbidly obese, sitting upright, in no  distress.  HEENT:  Normocephalic, atraumatic.  NECK:  Palpable carotid pulses without bruits.  Unable to assess JVD  secondary to neck girth.  LUNGS:  Diminished breath sounds at the bases, but without crackles or  wheezes.  HEART:  Regular rate and rhythm (S1,S2).  No significant murmurs.  No  rubs.  ABDOMEN:  Protuberant, nontender.  EXTREMITIES:  Palpable distal pulses with 1+ bilateral pitting edema.  NEURO:  Extremely flat affect, but no focal deficit.   IMPRESSION:  1. Atypical chest pain.  a.     Recent normal adenosine stress Cardiolite; ejection fraction       59%.      b.     Nonobstructive coronary artery disease with widely patent       circumflex stent site by cardiac catheterization, June 2008.      c.     Question coronary vasospasm etiology.      d.     Status post NSTEMI/Taxus stenting distal circumflex,       December 2007.  2. History of persistent sinus tachycardia.      a.     History of negative chest CT scan for pulmonary embolus.  3. Longstanding palpitations, currently undergoing cardiac monitoring.  4. Significant Imdur intolerance, secondary to severe headaches.  5. History of esophageal stricture.  6. Insulin-dependent diabetes mellitus.  7. Dyslipidemia.  8. Anxiety, depression.  9. Obstructive sleep apnea, BiPAP intolerant.   PLAN:  1. The patient is not to be rechallenged with Imdur, secondary to      experiencing severe headache.  Moreover, this did not appear to      ameliorate her  symptoms.  Therefore, it is not clear that she does,      in fact, have coronary vasospasm as the etiology of her symptoms.      This was also noted by Dr. Albertine Patricia at time of her last cardiac      catheterization in June 2008.  He indicated that there were no      associated EKG changes at the time that patient was having active      symptoms, thus arguing against vasospasm as her probable etiology.  2. Proceed with completion of current CardioNet monitoring to exclude      any underlying dysrhythmia as possible etiology for her      longstanding palpitations and bouts of near-syncope.  3. I strongly advised Ms. Tangredi that she consider returning to a      psychiatrist for reevaluation and treatment of persistent      depression.  4. Schedule return clinic follow up with myself and Dr. Dannielle Burn in 6      months, or sooner as needed.      Mannie Stabile, PA-C  Electronically Signed      Carlena Bjornstad, MD, [facility]  Electronically Signed   GS/MedQ  DD: 08/03/2007  DT: 08/03/2007  Job #: [id number]   cc:   Jerene Bears, MD

## 2010-07-30 NOTE — Assessment & Plan Note (Signed)
[facility] OFFICE NOTE   [name], [name]                       MRN:          [id number]  DATE:02/23/2007                            DOB:          Jul 15, 1950    PRIMARY CARDIOLOGIST:  Dr. Terald Sleeper.   REASON FOR VISIT:  Scheduled 3-monthfollowup.  Please refer to Dr.  DArlina Robesoffice note of November 5, for full details.   At that time, medications were adjusted in light of recent symptomatic  orthostatic hypotension with down-titration of Lasix to 40 b.i.d.,  tapering of Lopressor initially to 25 b.i.d. with subsequent complete  sensation, initiation of Cardizem 180 daily, and discontinuation of  Requip.  There was also the suggestion that the patient could start on  isosorbide 60 daily - however, she informs me today that she was not  given a prescription for this.  Review of her chart also suggests an  intolerance to IMDUR in the past.   Clinically, the patient feels much better.  She has no further  dizziness.  She denies any chest pain.  She has chronic shortness of  breath with no recent exacerbation.  She does use BiPAP, but is not  fully compliant.   The patient has lost 8 pounds since her last visit.  She has not had any  followup blood work.  Most recent lipid profile this past September  shows excellent control:  Total cholesterol 160, triglyceride 390, HDL  47, and LDL 36 on Crestor 20.  I started her on Crestor 10 daily back in  January of this year, at which time she had a total cholesterol of 190,  HDL 55, and triglycerides 46.   CURRENT MEDICATIONS:  1. Lasix 40 b.i.d.  2. K-Dur 30 b.i.d.  3. Potassium 15 mEq daily.  4. Aspirin 325 daily.  5. Plavix.  6. Benazepril 20 daily.  7. Diltiazem ER 180 daily.  8. Alprazolam 1 mg t.i.d.  9. Levemir 52 b.i.d.  10.Morphine pills and patches as directed.  11.Prevacid 30 b.i.d.  12.Pangestyme t.i.d.  13.Cymbalta 60 b.i.d.  14.Premarin  1.25 daily.  15.Keppra 500 nightly.  16.Crestor 20 daily.   PHYSICAL EXAMINATION:  Blood pressure 152/89 initially, followup 129/88.  Pulse 124 initially, 110 in followup.  Weight 246.2 (down 8).  GENERAL:  A 60year old female, obese, sitting upright in no acute  distress.  HEENT:  Normocephalic, atraumatic.  NECK:  Palpable bilateral carotid pulses without bruits.  Unable to  assess JVD secondary to neck girth.  LUNGS:  Clear to auscultation in all fields.  HEART:  Regular rate and rhythm (S1, S2).  No significant murmurs.  ABDOMEN:  Protuberant, nontender.  EXTREMITIES:  1+ nonpitting edema.  NEURO:  Very flat affect, but no focal deficit.   IMPRESSION:  1. Status post orthostatic hypotension.      a.     Resolved with medication adjustment.  2. Uncontrolled hypertension.  3. Elevated basal heart rate.  4. Coronary artery disease - stable.      a.     As previously outlined.  b.     Question coronary vasospasm, treated with Cardizem (IMDUR       intolerant).  5. Insulin-dependent diabetes mellitus.  6. Morbid obesity.  7. Chronic lower extremity edema - stable.  8. Anxiety and depression.  9. Dyslipidemia.      a.     Much improved on Crestor.  10.Normal left ventricular function.  11.History of esophageal stricture.      a.     Status post dilatation.   PLAN:  1. Continue medication adjustment with up-titration of Cardizem to 240      daily for better blood pressure and basal heart rate control.  2. Down-titrate aspirin to 81 daily.  The patient has opted to remain      on Plavix indefinitely.  She is now 1 year out from undergoing PCI      with a drug-eluting stent for treatment of non-ST-elevation      myocardial infarction in December 2007.  3. Follow up blood work with a BMET for monitoring of electrolytes and      renal function.  4. Schedule return clinic followup with myself and Dr. Dannielle Burn in 3      months.      Gene Serpe, PA-C  Electronically  Signed      Ernestine Mcmurray, MD,FACC  Electronically Signed   GS/MedQ  DD: 02/23/2007  DT: 02/23/2007  Job #: [id number]   cc:   Jerene Bears

## 2010-07-30 NOTE — Assessment & Plan Note (Signed)
[facility] OFFICE NOTE   [name]                       MRN:          [id number]  DATE:09/03/2006                            DOB:          21-Apr-1950    HISTORY OF PRESENT ILLNESS:  The patient is a 60 year old female with a  history of severe single-vessel coronary artery disease. The patient  when seen recently in the hospital complained of substernal chest pain  and we were concerned about unstable angina. The patient was transferred  for catheterization, this was performed by Dr. Albertine Patricia. However, the  patient did not have any significant restenosis. Dr. Albertine Patricia felt that  the patient was noncardiac. The patient states that she has been doing  well. She has had no recurrent chest pain, shortness of breath,  orthopnea or PND. She is interested in undergoing bariatric surgery in  the future.   MEDICATIONS:  Listed in the chart.   PHYSICAL EXAMINATION:  VITAL SIGNS:  Blood pressure 124/76, heart rate  is 68 beats per minute, weight is 241 pounds.  NECK:  Normal carotid upstrokes, no carotid bruits.  LUNGS:  Clear. Breath sounds bilaterally.  HEART:  Regular rate and rhythm, normal S1, S2. No murmurs or gallop.  ABDOMEN:  Soft, nontender, no rebound or guarding. Good bowel sounds.  EXTREMITIES:  No cyanosis, clubbing or edema.  NEUROLOGIC:  The patient is alert and oriented and grossly nonfocal.   PROBLEM LIST:  1. Coronary artery disease with stable coronary anatomy as detailed in      the catheterization report.      a.     Status post non ST elevation myocardial infarction, TAXUS       stent to the distal circumflex, December 2007.      b.     Residual nonobstructive coronary artery disease.  2. History of angina with the possibility of coronary vasospasm      although not documented on this recent admission.  3. Preserved left ventricular function.  4. Insulin dependent diabetes.  5.  Hypertension.  6. Chronic lower extremity edema.  7. Morbid obesity.  8. Obstructive sleep apnea.  9. Anxiety and depression.  10.Dyslipidemia.  11.Gastroesophageal reflux disease.  12.Panic attacks.   PLAN:  1. The patient is doing quite well, she has no recurrent substernal      chest pain. No further cardiac workup is required.  2. The patient is interested in bariatric surgery but I told her that      this will need to be postponed until she had a year of Plavix in      the setting of her stent placement in December 2007.  3. The patient will followup with [place] in 6 months to further discuss      the change in her medical regimen at that time.     Emily Mcmurray, MD,FACC  Electronically Signed    GED/MedQ  DD: 09/03/2006  DT: 09/04/2006  Job #: [id number]   cc:   Jerene Bears

## 2010-07-30 NOTE — Assessment & Plan Note (Signed)
[facility]                          [facility] OFFICE NOTE   RENEE, ERB                       MRN:          [id number]  DATE:12/14/2006                            DOB:          [birth year]    REFERRING PHYSICIAN:  Dhruv [name]   HISTORY OF PRESENT ILLNESS:  The patient is a 60 year old female with a  history of single vessel coronary artery disease.  The patient is status  post a prior Taxus stent placement to the distal circumflex coronary  artery.  She had a recent catheterization in June of 2008 with no  evidence of recurrent restenosis.  The patient continues to complain  however of chest pain which is both at rest and exertion.  Her biggest  complaint now however are symptoms related to orthostatic hypertension.  The patient states that when she gets up quickly she feels very dyspneic  and presyncopal.  Orthostatic blood pressure done in the office and  confirmed orthostasis.  Interestingly, she was recently seen by Rosanne Sack in the office and was doubled up on her Lasix.  The patient now  states that she feels very dehydrated and is thirsty most of the time.  She also saw Dr. Brandon Melnick recently due to her severe diabetic neuropathy.   MEDICATIONS:  1. Requip 1 mg p.o. b.i.d.  2. Lasix 40 mg, 120 in the morning and 80 in the evening.  3. Potassium 20 mEq p.o. b.i.d. and potassium 10 mEq p.o. b.i.d.  4. Aspirin 325 p.o. daily  5. Benazepril 10 mg p.o. daily  6. Diltiazem ER 120 mg p.o. daily  7. Crestor 20 mg p.o. daily  8. Morphine patch every 3 days.  9. Keppra 5 mg p.o. q.p.m.  10.Isosorbide ER 30 mg p.o. daily   PHYSICAL EXAMINATION:  VITAL SIGNS:  Blood pressure 116/82, heart rate  110 beats per minute, weight 243 pounds.  NECK EXAM:  Normal carotid upstroke and no carotid bruits.  LUNGS:  Clear breath sounds bilaterally.  HEART:  Normal S1, S2, no murmurs, rubs or gallops.  ABDOMEN:  Soft, nontender.  EXTREMITIES:   No cyanosis, clubbing or edema.  I cannot not palpate the  distal pulse in dorsalis pedis, posterior tibial.  I have a faint pulse  in dorsalis pedis and posterior tibial in the left leg.  Twelve-lead electrocardiogram:  Sinus tachycardia, no acute ischemic  changes.   PROBLEM LIST:  1. Coronary artery disease, unstable anatomy.      a.     Status post non-STEMI, Taxus stent to distal circumflex      b.     Residual nonobstructive coronary artery disease.  2. History of angina with possible coronary vasospasm but not      documented by EKG.  3. Preserved LV function.  4. Insulin-dependent diabetes mellitus.  5. Neuropathy.  6. Chronic lower extremity edema, resolved.  7. Morbid obesity.  8. Dehydration with orthostatic hypotension, dehydration in      conjunction with Requip.  9. Anxiety and depression.  10.Dyslipidemia.  11.Gastroesophageal reflux disease.  12.Esophageal stricture status post esophageal dilatation.  PLAN:  1. I told the patient that I am concerned that she might have had      recurrence of esophageal stricture and needs to see Dr. Laural Golden.  I      am also wondering if her episodes at night where she has a      sensation of choking could be related to reflux.  2. I asked the patient to stop her Lasix for 2 days and hydrate      herself.  She will also cut back on her Lasix going forward to half      doses as well as half doses of potassium.  3. Laboratory work will be drawn to make sure that the patient does      not have significant hypokalemia or hypo-magnesemia.  4. The patient will follow up with [place] in one month.  5. I have also ordered ABIs with Dopplers.     Ernestine Mcmurray, MD,FACC  Electronically Signed    GED/MedQ  DD: 12/14/2006  DT: 12/14/2006  Job #: [id number]   cc:   Jerene Bears

## 2010-07-30 NOTE — Assessment & Plan Note (Signed)
[facility] OFFICE NOTE   ALEAN, KROMER                       MRN:          [id number]  DATE:11/10/2006                            DOB:          May 23, 1950    HISTORY OF PRESENT ILLNESS:  Ms. Stiggers has multiple medical problems  and multiple complaints.  I believe that she is fluid overloaded at this  time.  She does have shortness of breath.  She also has chest  discomfort.  Her chest discomfort can occur daytime or nighttime.  There  has been question of spasm over time, but it has never been proven.  She  does not tolerate Imdur with severe headaches.  She is on a calcium  channel blocker Cardizem at this time.  I am not inclined to switch her  to Norvasc in this setting.  I wonder if some of her nighttime symptoms  could be reflux.   The patient has noted increased fluid recently.  Weeks ago, she was told  to change her Bumex to Lasix 200 mg daily, which she did for several  days, and then it was switched to 120.  She did respond to that regimen,  but she still has increased fluid.  She says that she is not eating  excess salt, and she is not drinking excess fluid.   PAST MEDICAL HISTORY:   ALLERGIES:  ANAROL and VERELAN.  Intolerance to IMDUR with headaches.   MEDICATIONS:  1. Prevacid 30 mg b.i.d.  2. Requip 1 mg daily.  3. Metoprolol extended release 50 mg b.i.d.  4. Pangestyme EC 2 tablets b.i.d.  5. Xanax 1 4 times a day.  6. Lidoderm patch.  7. Folic acid.  8. B12 shots.  9. Cymbalta.  10.Premarin.  11.Plavix 75 mg daily.  12.Aspirin 325 mg daily.  13.Levemir FlexPen 40 units b.i.d.  14.Phenergan.  15.Nitroglycerin p.r.n.  16.Humalog.  17.Benazepril 10 mg.  18.Diltiazem ER 120 mg.  19.Crestor 20 mg.  20.Keppra 750 mg.  21.Potassium 3 times daily.  22.Morphine.  23.Hydrocodone.  24.Lasix currently at 120 mg daily (dose to be changed to 80 b.i.d.).   OTHER MEDICAL PROBLEMS.:   See the list below.   REVIEW OF SYSTEMS:  The patient has multiple ongoing issues.  She has a  sensation of an increased heart rate.  She has not had syncope or  presyncope.  She is having some increased reflux symptoms.  She feels  warm a great deal of the time.  Review of Systems otherwise is mildly  positive for multiple other issues.   PHYSICAL EXAMINATION:  GENERAL:  The patient is here with her husband  today.  VITAL SIGNS:  Blood pressure 123/79, and her pulse is 90.  Weight is 250  pounds.  NEUROLOGIC:  The patient is oriented to person, time, and place.  Affect  reveals that she has a positive Review of Systems.  LUNGS:  Clear.  Respiratory effort is not labored.  CARDIAC:  Exam reveals an S1 with an S2.  There are not clicks or  significant murmurs.  ABDOMEN:  Obese.  She has normal bowel sounds.  EXTREMITIES:  The patient wearing support hose.  She still has 1+ edema  underneath her support hose.   PROBLEM LIST:  1. Coronary artery disease.  She had a stent placed in December 2007.      She needs to be on Plavix for at least a year before she has any      elective surgery.  She had followup cardiac catheterization on August 18, 2006, and at that time it was felt that her pain was noncardiac.      She did have non-ST-elevation MI in December 2007 with a TAXUS      stent at that time, and there is mild residual nonobstructive      coronary disease.  2. History of some angina over time.  There is question of spasm.      This has not been documented.  She is on Cardizem.  She does not      tolerate IMDUR because of headaches.  3. Intolerance to IMDUR with headaches.  4. Good left ventricular function.  5. Diabetes.  6. Hypertension.  7. Lower extremity edema.  I do believe that we can push harder on her      diuretics, and her Lasix will be increased to 80 b.i.d.  8. Morbid obesity.  A surgical procedure might be in order for her,      but she cannot have her Plavix  stopped before December 2008.  9. CPAP.  She is to see Dr. Rolena Infante today for further adjustment of      this.  10.Anxiety and depression.  11.Dyslipidemia.  12.Gastroesophageal reflux disease.  13.Panic attacks.   PLAN FOR TODAY:  Increase the patient's Lasix, check a BMET in a week,  and then see her back in the office for followup to see how she is  responding.     Carlena Bjornstad, MD, [facility]  Electronically Signed    JDK/MedQ  DD: 11/10/2006  DT: 11/10/2006  Job #: [id number]   cc:   Jerene Bears

## 2010-07-30 NOTE — Cardiovascular Report (Signed)
NAMEJAZZMYNE, [name] NO.:  [id number]   MEDICAL RECORD NO.:  [id number]          PATIENT TYPE:  INP   LOCATION:  2807                         FACILITY:  [facility]   PHYSICIAN:  Ethelle Lyon, MD  DATE OF BIRTH:  1951/01/30   DATE OF PROCEDURE:  08/18/2006  DATE OF DISCHARGE:                            CARDIAC CATHETERIZATION   PROCEDURE:  Left heart catheterization, left ventriculography, coronary  angiography.   INDICATIONS:  [name] is a 60 year old lady with coronary disease in  the setting of hypertension, diabetes and hypercholesterolemia.  She  underwent placement of drug-eluting stent in her circumflex in 2007.  She now presents with recurrent chest pain, occurring at rest.  She has  associated chest wall tenderness.  Electrocardiogram is unchanged from  previous, and troponins were negative x2.  Nonetheless, given her known  disease, she was recommended to undergo cardiac catheterization, for  definitive exclusion of progression of the disease as the etiology of  her chest discomfort.   PROCEDURAL TECHNIQUE:  Informed consent was obtained.  Underwent 1%  lidocaine, local anesthesia, a 5-French sheath was placed in the right  common femoral artery using the modified Seldinger technique.  While he  had no difficulty with access, the patient developed hypotension and  bradycardia, as well as nausea and diaphoresis, upon placement of the  sheath.  Symptoms were consistent with vagal episode.  She was treated  with 1 ampule of atropine and IV fluid, with resolution of her symptoms  and improvement in systolic blood pressure from 60 mmHg to 100 mmHg.  The remainder of the procedure, she remained stable.   Coronary angiography and left heart catheterization were performed using  JL-4, JR-4, and pigtail catheters.  We also used a no torque right  catheter.  We injected intra-arterial nitroglycerin into the right  coronary, to assess for possible spasm.   The patient tolerated the  procedure well was transferred to holding room in stable condition.  Sheaths will be removed there.   COMPLICATIONS:  None.   FINDINGS:  1. LV:  97/5/9.  EF 65% without regional wall motion abnormality.  2. No aortic stenosis or mitral regurgitation.  3. Left main:  Angiographically normal.  4. LAD:  Moderate-sized vessel giving rise to two diagonals.  It is      angiographically normal.  5. Circumflex:  Moderate-sized vessel giving rise to two marginals and      a posterior left ventricular branch.  The previously placed stent      in the distal circumflex is widely patent.  There is a 40% stenosis      of the posterior left ventricular branch.  6. RCA:  Moderate-sized dominant vessel.  There is a 40% stenosis      proximally, which may well be spasm.  It improved but did not      completely resolve with nitroglycerin.   IMPRESSION/PLAN:  Widely patent circumflex stent and mild nonobstructive  disease of the circumflex and RCA.  Suspect a noncardiac etiology to her  chest discomfort.  While spasm has been entertained as a possible  etiology of her chest discomfort in the past, the absence of  electrocardiogram during active absence of electrocardiographic change  during active symptoms argues against this for the current episode.      Ethelle Lyon, MD  Electronically Signed     WED/MEDQ  D:  08/19/2006  T:  08/19/2006  Job:  [id number]

## 2010-07-30 NOTE — Assessment & Plan Note (Signed)
[facility] OFFICE NOTE   [name], [name]                       MRN:          [id number]  DATE:07/15/2007                            DOB:          Jul 27, 1950    PRIMARY CARDIOLOGIST:  Ernestine Mcmurray, M.D., [place] VISIT:  Scheduled followup.   [name] reports progressive symptoms of both near-syncope with  associated tachy palpitations as well as nonexertional chest pain.  Her  last episode of chest pain occurred several months ago, while lying in  bed, for which she took an anxiolytic but no nitroglycerin.  These  symptoms are unpredictable in onset and last less than 5 minutes in  duration.  There is no strict correlation with exertion.  However, she  is concerned that they are reminiscent of her MI symptoms in December of  2007.   Her most recent cardiac catheterization in June of 2008, however, showed  widely patent CFX stent with mild, residual nonobstructive CAD of the  CFX and RCA.  There was also some question that this could represent  coronary vasospasm.   The patient also has a longstanding history of palpitations.  She  reportedly underwent remote monitoring which apparently was negative.  She seems to suggest a recurrence of some progressive quality to these  symptoms in the recent past.   Electrocardiogram today reveals sinus tachycardia at 111 bpm.  There are  no acute changes.   The patient reports prior intolerance to Imdur, secondary to significant  headache.  She also was not able to tolerate my recommended increased  dose of Cardizem to 240 daily, when she was last seen in the clinic.  She states that this resulted in significant diaphoresis, which then  resolved by resuming the previous dose of 180 mg daily.   CURRENT MEDICATIONS:  1. Morphine 60 mg t.i.d.  2. Morphine patch 75 mg every 3 days.  3. Hydrocodone 10 mg t.i.d.  4. Levemir 52 mg b.i.d.  5. Lasix 40  mg b.i.d.  6. Benazepril 20 q.a.m.  7. Aspirin 81 mg daily.  8. Diltiazem ER 180 mg daily.  9. Prevacid.  10.Pangestyme.  11.Potassium.  12.Cymbalta 16 mg b.i.d.  13.Premarin.  14.Plavix.  15.Crestor 20 daily.   PHYSICAL EXAMINATION:  VITAL SIGNS:  Blood pressure 141/81, pulse 103,  regular weight 239 (down 7).  GENERAL:  A 60 year old female, morbidly obese, sitting upright, no  distress.  HEENT:  Normocephalic, traumatic.  NECK:  Palpable carotid pulses without bruits.  Unable to assess JVD  secondary to neck girth.  LUNGS:  Diminished breath sounds at the bases but without crackles or  wheezes.  HEART:  Regular rhythm with increased rate (S1 and S2).  No significant  murmurs.  No rubs.  ABDOMEN:  Protuberant, nontender.  EXTREMITIES:  Palpable distal pulses with 1+ bilateral pitting edema.  NEUROLOGIC:  Very flat affect, but no focal deficit.   IMPRESSION:  1. Atypical chest pain.      a.     Nonobstructive CAD with widely patent circumflex stent site  by cardiac catheterization, June 2008.      b.     Question coronary vasospasm etiology.      c.     Status post non-STEMI/Taxus stenting distal circumflex,       December 2007.  2. Preserved left ventricular function.  3. Persistent sinus tachycardia.      a.     History of negative chest CT scan for pulmonary embolism,       March 2008.  4. History of orthostatic hypotension.  5. Imdur intolerance.      a.     Secondary to significant headache.  6. Mixed dyslipidemia.  7. History of esophageal stricture.  8. Morbid obesity.  9. Chronic lower extremity edema.  10.Insulin-dependent diabetes mellitus.  11.Anxiety/depression.  12.Near-syncope.  13.Longstanding palpitations.  14.Obstructive sleep apnea.      a.     BiPAP intolerant.   PLAN:  1. CardioNet monitoring to exclude underlying dysrhythmia as possible      etiology for her bouts of recurrent near-syncope.  2. Schedule adenosine stress Cardiolite for  risk stratification.  I am      not convinced that these symptoms are truly ischemic in etiology.      Nevertheless, we will maintain a low threshold to proceeding with a      repeat cardiac catheterization, if there is any suggestion of      definite ischemia.  3. Rechallenge with Imdur for treatment of possible coronary      vasospasm, albeit at the much lower dose of 15 mg daily so as to      minimize any significant associated headaches.  4. Schedule early clinic followup with myself for review of her stress      test results and further recommendations.      Gene Serpe, PA-C  Electronically Signed      Ernestine Mcmurray, MD,FACC  Electronically Signed   GS/MedQ  DD: 07/15/2007  DT: 07/15/2007  Job #: [id number]   cc:   Jerene Bears, MD

## 2010-07-30 NOTE — Assessment & Plan Note (Signed)
[facility] OFFICE NOTE   Emily, Hopkins                       MRN:          [id number]  DATE:01/20/2007                            DOB:          1950-10-23    HISTORY OF PRESENT ILLNESS:  The patient is a 60 year old female with  history of coronary artery disease.  The patient is status post prior  Taxus stent to the circumflex coronary artery.  The patient presents for  follow-up.  In particular, she is here for review of her laboratory  work.  The BMET was within normal limits with a stable potassium level.  Glucose, however, was 296.  Arterial Dopplers demonstrate no significant  flow-limiting disease.   EKG was reviewed in the office today and demonstrated normal sinus  rhythm with no acute abnormalities.  The patient does report some  problems with dizziness and weakness.  She denies, however, any  palpitations and has no chest pain.   MEDICATIONS:  List is reviewed in the chart and is extensive.   PHYSICAL EXAMINATION:  VITAL SIGNS:  Blood pressure 133/79, heart rate  83, weight 254 pounds.  NECK:  Normal carotid upstroke and no carotid bruits.  LUNGS:  Clear breath sounds bilaterally.  HEART:  Regular rate and rhythm with normal S1 and S2 with no murmurs,  rubs, or gallops.  ABDOMEN:  Soft.  EXTREMITIES:  No cyanosis, clubbing, or edema.   PROBLEM LIST:  1. Coronary artery disease, stable anatomy.      a.     Status post non-ST elevation myocardial infarction with       Taxus stent to the distal circumflex.      b.     Residual nonobstructive coronary artery disease.  2. History of angina with possible coronary vasospasm.  3. Preserved left ventricular function.  4. Insulin-dependent diabetes mellitus.  5. Neuropathy.  6. Chronic lower extremity edema, resolved.  7. Morbid obesity.  8. Hypotension.  9. Anxiety and depression.  10.Dyslipidemia.  11.Gastroesophageal reflux disease.  12.Esophageal stricture, status post esophageal dilatation.   PLAN:  1. I will adjust the patient's medications particularly to make sure      that she does not have any symptoms of orthostasis.  I discontinued      Requip and cut Lasix to 40 mg p.o. b.i.d.  Cardizem will be dosed      at 180 mg p.o. daily.  2. Lopressor also will be decreased to 25 mg p.o. b.i.d. after one      week of use can then be discontinued and replaced by Cardizem.  The      patient was also given isosorbide 60 mg p.o. daily.  3. The patient will follow up with [place] in the next couple of months.     Emily Mcmurray, MD,FACC  Electronically Signed    GED/MedQ  DD: 02/22/2007  DT: 02/22/2007  Job #: [id number]   cc:   [name]

## 2010-07-30 NOTE — Discharge Summary (Signed)
[name], [name] NO.:  [id number]   MEDICAL RECORD NO.:  [id number]          PATIENT TYPE:  INP   LOCATION:  2003                         FACILITY:  [facility]   PHYSICIAN:  Ethelle Lyon, MD  DATE OF BIRTH:  08-Jul-1950   DATE OF ADMISSION:  08/18/2006  DATE OF DISCHARGE:  08/20/2006                               DISCHARGE SUMMARY   PRIMARY CARDIOLOGIST:  Ernestine Mcmurray, MD,FACC   PRIMARY CARE [place]:  Dr. Woody Seller, [place].   DISCHARGE DIAGNOSIS:  Chest pain.   SECONDARY DIAGNOSES:  1. Coronary artery disease.      a.     Status post non-ST-elevation myocardial infarction with       Taxus drug-eluting stent placement to the distal circumflex in       December 2007.  2. Coronary vasospasm.  3. Type 2 diabetes mellitus.  4. Hypertension.  5. Chronic lower extremity edema.  6. Morbid obesity.  7. Obstructive sleep apnea.  8. Anxiety and depression.  9. Hyperlipidemia.  10.Hypertriglyceridemia.  11.Gastroesophageal reflux disease.  12.Pancreatitis.  13.Fall with chronic back pain.   ALLERGIES:  ADDERALL and VERELAN.   PROCEDURES:  Left heart cardiac catheterization.   HISTORY OF PRESENT ILLNESS:  This is a 60 year old married Caucasian  female with the above problem list who was in her usual state of health  until the evening prior to admission, when she was walking with her  husband and had onset of severe substernal chest discomfort with  associated diaphoresis and shortness of breath.  She saw Dr. Dannielle Burn in  the office on June 3 and had returned discomfort, which was actually  reproducible.  An ECG showed no acute changes.  She was transferred to  [facility] for [facility] evaluation.   HOSPITAL COURSE:  The patient ruled out for MI by cardiac markers.  Cardiac catheterization took place on August 18, 2006, revealing  questionable coronary vasospasm in the proximal RCA, but otherwise  nonobstructive coronary disease and a patent stent in the left  circumflex.  Post procedure, she has had no recurrent chest discomfort.  She will be discharged home today in satisfactory condition.  Of note,  the patient has asked to come off of her Imdur therapy secondary to  persistent headaches.  She realizes that it is used for the possibility  of coronary vasospasm, but would like to try and come off of it.   DISCHARGE LABS:  Hemoglobin 12.0, hematocrit 35.5, WBC 6.2, platelets  282.  Sodium 141, potassium 4.3, chloride 102, CO2 32, BUN 5, creatinine  0.55, glucose 154.  Total bilirubin 0.9, alkaline phosphatase 68, AST  26, ALT 13, total protein 6.4, albumin 3.1, calcium 8.3.  Cardiac  enzymes are negative x3.  Total cholesterol 196, triglycerides 902, HDL  47, LDL unable to be calculated.  TSH 1.232.   OUTSTANDING LAB STUDIES:  None.   DISPOSITION:  The patient is being discharged home today in good  condition.   FOLLOWUP PLANS AND APPOINTMENTS:  She has followup with Dr. Dannielle Burn on  June 19 at 2:30 p.m.  She is also  to follow up with Dr. Woody Seller in  approximately 1-2 weeks.   DISCHARGE MEDICATIONS:  1. Requip 1 mg b.i.d.  2. Humalog sliding scale insulin as previously prescribed.  3. Cholestyramine as previously prescribed.  4. Aspirin 325 mg daily.  5. Crestor 10 mg daily.  6. Benazepril 20 mg daily.  7. Diltiazem ER 120 mg daily.  8. Prevacid 30 mg b.i.d.  9. Metoprolol ER 50 mg daily.  10.Pangestyme EC 2 tabs b.i.d.  11.Xanax 1 mg q.i.d.  12.Potassium 10 mEq b.i.d.  13.Lidocaine patch 5% daily.  14.Folic acid 1 mg daily.  15.Cymbalta 60 mg b.i.d.  16.Bumex 1 mg daily.  17.Plavix 75 mg daily.  18.Keppra 750 mg b.i.d.  19.[place]adian 80 mg b.i.d.  20.Insulin 70/30, 45 units b.i.d.   DURATION OF DISCHARGE ENCOUNTER:  Is 40 minutes, including physician  time.     Murray Hodgkins, ANP      Ethelle Lyon, MD  Electronically Signed   CB/MEDQ  D:  08/20/2006  T:  08/20/2006  Job:  [id number]   cc:   Jerene Bears

## 2010-07-30 NOTE — H&P (Signed)
[name], [name] NO.:  [id number]   MEDICAL RECORD NO.:  [id number]          PATIENT TYPE:  INP   LOCATION:  5188                         FACILITY:  [facility]   PHYSICIAN:  Emily Mcmurray, MD,FACC DATE OF BIRTH:  1950/09/04   DATE OF ADMISSION:  03/11/2006  DATE OF DISCHARGE:  03/12/2006                              HISTORY & PHYSICAL   PRIMARY CARDIOLOGIST:  Emily Mcmurray, MD,FACC.   REASON FOR ADMISSION:  Unstable angina.   HISTORY OF PRESENT ILLNESS:  [name] is a 60 year old female with a  history of severe single vessel coronary artery disease.  The patient is  status post non-ST elevation myocardial infarction in December, 2007.  She was treated with a Taxus stent to the distal circumflex.  She was  found to have residual nonobstructive coronary artery disease.  She also  had post-procedure ongoing angina, which was felt to be secondary to  possible coronary vasospasm.  Over the last several months, her anginal  pattern was quiescent on Imdur and calcium channel blocker therapy.  The  patient now presents to the office for a routine follow-up visit.  She  states that over the last 3-4 days, she started having again recurrent  substernal chest pain.  She states that her pain is very reminiscent of  her symptoms back in December.  Yesterday evening, she went walking with  her husband, and she had to return home due to the onset of severe  substernal chest pain on exertion associated with diaphoresis and  shortness of breath.  She states that the slightest bit of activity will  provoke severe substernal chest pain.  As a matter of fact, the patient  sitting here in the office today suddenly started developing substernal  chest pain, which she described as a 9/10.  She did indeed become  slightly pale and somewhat diaphoretic.  Interestingly, however, when I  put the patient on the stretcher, after I gave her sublingual  nitroglycerin, she had also  reproducible chest pain on chest palpation;  however, she states that this pain is distinctly different from her  heavy pain.  Ultimately, after two nitroglycerin, the chest pain was  essentially resolved.  EKG, which was obtained just at the time of  substernal chest pain, showed no acute ischemic changes.   MEDICATIONS:  1. Humalog sliding scale.  2. Kadian 80 mg p.o. b.i.d.  3. Cholestyramine packets.  4. Aspirin 325 mg daily.  5. Isosorbide ER 30 mg daily.  6. Crestor 10 mg p.o. daily.  7. Benazepril 10 daily.  8. Diltiazem ER 120 mg p.o. daily.  9. Prevacid 30 mg p.o. b.i.d.  10.Requip 1 mg p.o. daily.  11.Metoprolol ER 50 mg a day.  12.Pangestyme EC 2 tablets b.i.d.  13.Xanax 1 mg 4 times a day.  14.Potassium 10 mEq p.o. b.i.d.  41.YSAYTKZSW patch.  16.Folic acid 1 mg a day.  17.B12 shots.  18.Cymbalta 60 mg twice daily.  19.Bumetanide 1 mg a day.  20.Premarin 1.25 daily.  21.Plavix 75 mg p.o. daily.  22.Levemir.  23.Phenergan p.r.n.  24.Hydrocodone p.r.n.  25.Nitroglycerin p.r.n.   ALLERGIES:  ADDERALL, VERELAN.   SOCIAL HISTORY:  Patient lives in [place] with her husband.  She does not  smoke.   FAMILY HISTORY:  Noncontributory.   REVIEW OF SYSTEMS:  As outlined above.  No nausea or vomiting.  No  fevers or chills.  No melena, hematochezia.  No dysuria or frequency.  No orthopnea or PND.  No palpitations or syncope.  No neurological  symptoms.  No weight loss.  Patient has a good appetite.  The remainder  of the review of systems is otherwise negative.   PHYSICAL EXAMINATION:  VITAL SIGNS:  Blood pressure 134/84, heart rate  101 beats per minute, weight 234 pounds.  GENERAL:  An overweight white female, somewhat pale, slightly  diaphoretic.  NECK:  Normal carotid upstrokes.  JVD of approximately 5 cm.  HEENT:  Normal.  LUNGS:  Clear bilaterally.  HEART:  Regular rate and rhythm with normal S1 and S2.  No murmurs, rubs  or gallops.  ABDOMEN:  Soft and  nontender with no rebound tenderness or guarding.  Good bowel sounds.  EXTREMITIES:  No clubbing, cyanosis or edema.  NEURO:  Patient is alert and oriented, grossly nonfocal.   A 12-lead EKG, sinus tachycardia, with no acute ischemic changes.   Laboratory work is pending and will be obtained at [facility].   PROBLEMS:  1. Unstable angina.  2. Coronary artery disease.      a.     Status post non-ST elevation myocardial infarction, status       post Taxus stent to the distal circumflex in December, 2007.      b.     Residual nonobstructive coronary artery disease.      c.     History of angina secondary to the possibility of coronary       vasospasm.  Quiescent on Imdur and calcium channel blocker       previously.  3. Preserved left ventricular function.  4. Insulin-dependent diabetes mellitus.  5. Hypertension.  6. Chronic lower extremity edema.  7. Morbid obesity.  8. Obstructive sleep apnea.  9. Anxiety and depression.  10.Dyslipidemia with high triglycerides.  11.Gastroesophageal reflux disease.  12.Pancreatitis.  13.History of fall with chronic back pain.   PLAN:  1. The patient has recurrence of abnormal chest pain, which is very      concerning for unstable angina.  Interestingly, however, she has no      EKG changes, and I was also able to reproduce some of her pain      syndrome with chest palpation, but she states that this is a      different type of pain.  2. The patient's history, however, is very concerning for unstable      angina with recurrent symptoms in the last 3-4 days.  Will plan      proceeding with a cardiac catheterization to establish her coronary      anatomy.  3. Patient will be transferred to [facility], ruled out for a      myocardial infarction, started on aspirin, Plavix, and Lovenox.      The patient will be transferred per [facility] to the     [facility] emergency room in the event that no step-down bed is       available.      Emily Mcmurray, MD,FACC  Electronically Signed     GED/MEDQ  D:  08/18/2006  T:  08/18/2006  Job:  [id number]   cc:   [name]

## 2010-07-30 NOTE — Consult Note (Signed)
[name], [name] NO.:  [id number]   MEDICAL RECORD NO.:  [id number]          PATIENT TYPE:  INP   LOCATION:  2550                         FACILITY:  [facility]   PHYSICIAN:  Aquilla Hacker, M.D. DATE OF BIRTH:  Nov 23, 1950   DATE OF CONSULTATION:  04/29/2007  DATE OF DISCHARGE:                                 CONSULTATION   HISTORY OF PRESENT ILLNESS:  [name] is a 60 year old female who was  admitted by Dr. Vanetta Shawl orthopedic service.  She has a past medical  history of coronary artery disease as well as non-ST elevated MI,  diabetes mellitus, and hypertension.  On April 29, 2007, the patient  underwent a left wrist arthroscopy, ulnar shortening with osteotomy.  Currently she is awake and she has no complaints.   PAST MEDICAL HISTORY:  1. Coronary artery disease with stents.  2. Type 2 diabetes mellitus.  3. Hypertension.  4. Chronic leg edema.  5. Obstructive sleep apnea.  6. Depression/anxiety.  7. Hyperlipidemia.  8. GERD.  9. Pancreatitis.  10.Chronic back pain.   PAST SURGICAL HISTORY:  Right hand surgery in May 2006.   ALLERGIES:  1. ADORAL.  2. VERELAN.   SOCIAL HISTORY:  The patient denies cigarettes.   CURRENT HOME MEDICATIONS:  1. Sliding scale Humalog insulin.  2. Aspirin 325 mg p.o. daily.  3. Crestor 10 mg p.o. daily .  4. Benazepril 20 mg daily.  5. Diltiazem ER 120 mg daily.  6. Prevacid 30 mg p.o. b.i.d.  7. Pengastine EC 2 tablets b.i.d.  8. Xanax 1 mg p.o. q.i.d.  9. Potassium 10 mg p.o. b.i.d.  10.Lidocaine patch 5% daily.  11.Folic acid 1 mg daily.  12.Cymbalta 60 mg b.i.d.  13.Plavix 75 mg daily.  14.Levemir 52 units in the morning and 52 units in the evening.   PHYSICAL EXAMINATION:  GENERAL:  The patient is awake, she is  cooperative, she is in no obvious distress.  HEENT:  Normocephalic, atraumatic, anicteric, extraocular movements  intact, oral mucosa pink, no thrush, no exudates.  NECK:  Supple, no JVD,  no lymphadenopathy.  CARDIAC:  S1 and S2 present, regular rate and rhythm, no gallops, no  rubs.  RESPIRATORY:  No crackles or wheezes.  EXTREMITIES:  The left arm is in a cast/Ace bandage.  NEUROLOGICAL:  The patient is alert and oriented x 3.   ASSESSMENT/PLAN:  1. Status post left hand/arm surgery.  Orthopedic surgery remains on      board.  Further recommendations as per their surface.  2. History of diabetes mellitus.  Will start the patient on Accucheks      as well as sliding scale insulin.  We will resume the patient's      home medications.  3. Hypertension.  Will verify the patient's current blood pressure and      resume her prior anti-hypertensive medications accordingly.  4. Obstructive sleep apnea.  BiPAP has been ordered by the primary      service.  5. Hyperlipidemia.  We will restart the patient's previous anti-      hyperlipidemia medications.  6.  GI prophylaxis.  Will provide Protonix.  7. DVT prophylaxis.  Will provide SCD/Lovenox.  8. History of CAD status post stent placement.  The patient currently      has no complaints of chest pain or shortness of breath, will resume      Plavix.   Thank you for the consultation.      Aquilla Hacker, M.D.  Electronically Signed     OR/MEDQ  D:  04/29/2007  T:  04/29/2007  Job:  [id number]

## 2010-07-30 NOTE — Assessment & Plan Note (Signed)
[facility] OFFICE NOTE   Emily, Hopkins                       MRN:          [id number]  DATE:11/24/2006                            DOB:          16-Jan-1951    PRIMARY CARDIOLOGIST:  Dr. Terald Sleeper.   PRIMARY CARE:  Dr. Jerene Bears.   [name] returns today for followup/2 week recheck.  She was seen by  Dr. Ron Parker on August 26 secondary to increased dyspnea with signs of  volume overload at that time.  Dr. Ron Parker had increased patient's  furosemide to 80 mg b.i.d. and arranged for her to have repeat blood  work done in a week.  [name] had also complained of some chest  discomfort at that time.  Patient currently on Plavix status post TAXUS  drug-eluting stent put in in December of 2007.  There was a question of  possible spasm-type discomfort.  Patient currently on Cardizem.  She  also has a known history of esophageal strictures, status post  dilatations in the past by Dr. Laural Golden.  [name] is pending her  followup appointment with Dr. Laural Golden for further evaluation.  She states  she has not been having to take any nitroglycerins since she last saw  Dr. Ron Parker but still complains of that atypical discomfort in her chest at  night occasionally.  [name] unfortunately has multiple medical  problems.  She presents today with complaints of signs of volume  overload.  When she initially saw Dr. Ron Parker she states her weight from  254 pounds to 236, however she has stayed at 236 pounds over the last 2  weeks without any further weight loss.  She still complains of dyspnea  with minimal exertion, currently early Class III [employer]  [employer] symptoms.  She also is complaining of abdominal tenderness  in the right upper quadrant and is pending a CT scan of the abdomen  today that has been ordered by Dr. Woody Seller.  [name] describes it as a  sharp abdominal discomfort.  She states her abdomen feels  tight, more so  in the right upper quadrant and continuously complains of discomfort in  that area with bloating.  She also is complaining of some lower  extremity edema and increased orthopnea.  She states she normally wears  her CPAP at night but the last week or so she has been unable to  tolerate it because she feels like she is having a smothering sensation  when she wears it.  She sleeps in an air-conditioned room with a ceiling  fan on and a bedside fan blowing on her and still has the smothering  sensation.  She states her diabetes has been difficult to manage also  with the wide fluctuation in her CBGs anywhere from 150 to 300s in the  morning.  She states it was 154 this morning.  She also has chronic  generalized pain requiring significant amounts of pain medication.   PAST MEDICAL HISTORY:  1. Coronary artery disease.      a.     Status  post non-ST elevated MI with placement of a TAXUS       drug-eluting stent to the distal circumflex in December 2007.      b.     Residual nonobstructive disease.      c.     History of angina secondary to possibility of coronary       vasospasms.  Patient currently on calcium channel blocker with       INTOLERANCE TO IMDUR.  2. Preserved left ventricular function.  3. Insulin-dependent diabetes.  4. Hypertension.  5. Chronic lower extremity edema.  6. Morbid obesity.  7. Obstructive sleep apnea with CPAP use.  8. Dyslipidemia with high triglycerides.  9. Gastroesophageal reflux disease.  10.History of pancreatitis.  11.History of fall with chronic back pain.  12.Diabetic neuropathy.  13.History of esophageal stricture, status post esophageal dilatation.  14.Anxiety, depression and panic attacks.  15.Irritable bowel syndrome.  16.Status post surgical history of cholecystectomy, hysterectomy,      bilateral salpingo-oophorectomy, C-section.  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  18.Chronic pain syndrome requiring daily narcotic use.  19.Ejection fraction of 65% status post cardiac catheterization in      June of 2008.   REVIEW OF SYSTEMS:  As stated above.   ALLERGIES:  ADDERALL AND VERELAN.   CURRENT MEDICATIONS:  1. Humalog sliding scale insulin.  2. Aspirin 325 daily.  3. Benazepril 10 mg daily.  4. Diltiazem ER 120 daily.  5. Crestor 20 mg daily.  6. Potassium, patient states she is currently taking 20 mEq b.i.d.  7. Morphine 60 mg t.i.d.  8. Hydrocodone 10/325.  9. Lasix  10.Morphine patch changed every 3 days.  11.Keppra 500 mg nightly.  12.Isosorbide 30 mg daily.  13.Prevacid 30 mg b.i.d.  14.Requip 1 mg daily.  15.Metoprolol ER 50 mg b.i.d.  16.Xanax 1 mg q.i.d.  17.Folic acid daily.  18.B-12 shot.  19.Cymbalta 60 mg b.i.d.  20.Premarin 1.25 mg daily.  21.Plavix 75 mg daily.  22.Levemir 45 units b.i.d.  23.P.r.n. medications include Phenergan, hydrocodone, nitroglycerin      and Lidoderm patch.   PHYSICAL EXAMINATION:  Weight today 249 pounds.  Patient states her  weight at home has been 236 on her scales which has been consistent x1  week.  Blood pressure 123/75 with heart rate of 87, respirations at rest  are 18, with minimal exertion in the office respirations increased to  22.  Ms. Koskela is in no acute distress.  She is a very pleasant  Caucasian female, morbidly obese.  Alert and oriented x3, ambulating in  the clinic without assistance.  At 45 degree angle she has symptoms of  JVD 10-12 sonometers.  LUNGS:  Clear to auscultation with distant breath sounds.  CARDIOVASCULAR:  Reveals an S1 and S2, regular rate and rhythm, do not  hear S3.  ABDOMEN:  Distended, she has palpable tenderness in the right upper  quadrant.  Limited exam secondary to discomfort.  LOWER EXTREMITIES:  Without clubbing or cyanosis.  She has +1 non-  pitting edema bilaterally with  positive pedal pulses.   IMPRESSION:  [name] with multiple medical problems complaining of  symptoms suggestive of volume overload although her physical exam does  not reflect any acute signs of volume overload, although with Ms.  Hopkins's weight I suspect that she is able to  hide her fluid status  well.  I am going to go ahead and double her Lasix.  Most recent blood  work done on September 2, potassium was 3.6 at that time.  I encouraged  [name] to keep her appointment for the CT of the abdomen this  afternoon that has been scheduled by Dr. Woody Seller for further evaluation of  her abdominal discomfort.  Questionable right-sided heart failure  contributing to her symptoms.  We will need to see [name] back  within 2-3 weeks for re-evaluation.  I also encouraged her to keep her  appointment with Dr. Laural Golden for further evaluation of her esophageal  strictures with a previous dilatation.  She is complaining of chest  discomfort at night, quite possibly related to her esophagus.      Rosanne Sack, ACNP  Electronically Signed      Satira Sark, MD  Electronically Signed   MB/MedQ  DD: 11/24/2006  DT: 11/24/2006  Job #: [id number]   cc:   Gery Pray, M.D.

## 2010-08-02 NOTE — H&P (Signed)
[name], [name]                ACCOUNT NO.:  [id number]   MEDICAL RECORD NO.:  [id number]          PATIENT TYPE:  AMB   LOCATION:  DAY                           FACILITY:  [facility]   PHYSICIAN:  Hildred Laser, M.D.    DATE OF BIRTH:  April 29, 1950   DATE OF ADMISSION:  DATE OF DISCHARGE:  LH                                HISTORY & PHYSICAL   CHIEF COMPLAINT:  Colonoscopy/follow up GERD and chronic diarrhea.   HISTORY OF PRESENT ILLNESS:  [name] is a 60 year old Caucasian female.  She has a history of chronic diarrhea felt to be due to IBS or diabetic  diarrhea.  She has not recently had a colonoscopy.  She believes her last  exam was over five years ago.  However, we have been unable to locate a  colonoscopy at [facility] or [facility].  She has a  family history of colon cancer in her mother who was in her 88s when she was  diagnosed.  She has been on Questran 4 g b.i.d.  This has worked well for  her.  She has even found herself constipated on this medicine and had to  slack off.  She denies any rectal bleeding or melena.  She does have  heartburn and indigestion.  She was previously on Prevacid 30 mg b.i.d.,  which she felt controlled her symptoms much better than omeprazole 20 mg  b.i.d.  She denies any dysphagia or odynophagia at this time.   PAST MEDICAL HISTORY:  1. Chronic gastroesophageal reflux disease.  She has a small sliding      hiatal hernia.  She has been dilated in the past.  She has had a normal      gastric emptying study.  2. She has a history of diabetes mellitus.  3. Diabetic neuropathy.  4. Dyslipidemia.  5. Hypertriglyceridemia.  6. Depression.  7. Anxiety.  8. CHF since childbirth in 1990.  9. Hypertension.  10.Chronic back pain.  11.Arthritis.  12.Bilateral wrist surgery.  13.Right forearm surgery.  14.Cholecystectomy for biliary dyskinesia.  15.C-section in 1990.  16.Complete hysterectomy.   CURRENT MEDICATIONS:  1.  Cymbalta 60 mg b.i.d.  2. Omeprazole 20 mg b.i.d.  3. Amlodipine 5/20 mg daily.  4. Premarin 1.6 mg daily.  5. Allegra 180 mg daily.  6. Phenergan p.r.n.  7. NovoLog 70/30, 27 units in the a.m., 28 units in the p.m.  8. Morphine 30 mg 2 p.o. b.i.d.  9. Alprazolam 0.5 mg daily.  10.Requip 2 b.i.d.  11.Bumetanide 1 mg b.i.d.  12.Hydrocodone 7.5 mg q.i.d. p.r.n.  13.Klor-Con 10 mEq 2 p.o. b.i.d.  14.Aspirin 81 mg daily.  15.Keppra 500 mg b.i.d.  16.Valium 10 mg daily.  17.Humalog sliding scale insulin p.r.n.   ALLERGIES:  DEMEROL causes itching.   FAMILY HISTORY:  Positive for mother diagnosed with colon cancer at age 37,  died secondary to MI.  She also has a history of leukemia.  Father deceased  at age 59 secondary to MI.  One brother deceased secondary to lung  carcinoma.   SOCIAL HISTORY:  [name] is married.  She is disabled.  She has three  living children.  One son deceased secondary to AIDS.  he was a hemophiliac.  She denies any tobacco, alcohol, or drug use.   REVIEW OF SYSTEMS:  CONSTITUTIONAL:  Her weight has remained stable.  Has  had some fatigue.  CARDIOVASCULAR:  Denies any chest pain or palpitations.  PULMONARY:  Denies any shortness of breath, dyspnea, cough, or hemoptysis.  GI:  See HPI.   PHYSICAL EXAMINATION:  VITAL SIGNS:  Weight 244 pounds, height 65 inches,  temperature 98.4, blood pressure 158/82, pulse 86.  GENERAL:  [name] is a 60 year old obese Caucasian female who alert,  oriented, pleasant, and cooperative, in no acute distress.  HEENT:  Sclerae clear, nonicteric.  Conjunctivae pink.  Oropharynx pink and  moist, without any lesions.  CHEST/HEART:  Regular rate and rhythm.  Normal S1, S2, without any murmurs,  clicks, rubs, or gallops.  LUNGS:  Clear to auscultation bilaterally.  ABDOMEN:  Protuberant and obese, with positive bowel sounds x4.  No bruits  auscultated.  Abdomen is soft, nondistended.  She does have tender   hepatosplenomegaly.  Liver is palpable at 6 fingerbreadths below the right  costal margin.  Unable to palpate splenomegaly.  Exam is limited given the  patient's body habitus.  There is no  rebound tenderness or guarding.  EXTREMITIES:  With 2+ lower extremity edema bilaterally.   IMPRESSION:  1. [name] is a 60 year old Caucasian female with chronic GERD.  She is      currently on omeprazole 20 mg b.i.d.  Had much better control with      Prevacid 30 mg b.i.d.  I will attempt to her back to Prevacid now that      she has failed omeprazole therapy, and hopefully her insurance company      will be willing to pay for this.  2. She has tender hepatomegaly.  She has had a recent CT scan through Dr.      Marcial Pacas office, and will try to obtain a copy of this for our review.  3. She is in need of colonoscopy, given history of chronic diarrhea and      family history of colon cancer.   PLAN:  1. Questran 4 g p.o. b.i.d. p.r.n. diarrhea, #60 packets, with 2 refills,  2. Prevacid 30 mg 1 p.o. b.i.d., #60, with 5 refills.  3. CT report from [facility].  4. Colonoscopy with Dr. Laural Golden in the near future.  I have discussed this      procedure including risks and benefits, including but not limited to      bleeding, infection, perforation, and drug reaction.  She agrees, and      consent will be obtained.      Les Pou, N.P.      Hildred Laser, M.D.  Electronically Signed    KC/MEDQ  D:  12/29/2005  T:  12/30/2005  Job:  [id number]

## 2010-08-02 NOTE — Cardiovascular Report (Signed)
[name], Emily NO.:  Hopkins   MEDICAL RECORD NO.:  [id number]          PATIENT TYPE:  INP   LOCATION:  6525                         FACILITY:  [facility]   PHYSICIAN:  Loretha Brasil. Lia Foyer, MD, FACCDATE OF BIRTH:  Apr 11, 1950   DATE OF PROCEDURE:  DATE OF DISCHARGE:                            CARDIAC CATHETERIZATION   INDICATIONS:  [name] is a 60 year old who presents with chest pain.  She underwent catheterization earlier this year and had non-significant  disease.  She now presents with recurrent symptoms and has positive  enzymes.  She was transferred down by Dr. Ron Parker for further evaluation.   PROCEDURES:  1. Left heart catheterization.  2. Selective coronary arteriography.  3. Selective left ventriculography.  4. Percutaneous coronary intervention with stenting of the circumflex      coronary artery using Taxus drug-eluting platform.   DESCRIPTION OF PROCEDURE:  The patient was brought to the  catheterization laboratory, prepped and draped in the usual fashion.  She had received enoxaparin at 8:30 in the morning.  Through an anterior  puncture, the femoral artery was entered.  A 5-French sheath was placed.  Catheterization study demonstrated a high-grade stenosis of the distal  circumflex.  Compared to previous studies, this is a new finding.  Central  views of the left and right coronaries were obtained.  Central  aortic and left ventricular pressures were measured with a pigtail.  Ventriculography was performed in the RAO projection.  Following this, I  discussed the case with the patient and subsequently with her husband  and son.  Preparations were made for percutaneous intervention.  The  patient had been agreeable to enrollment in the [place] protocol.  With  this, appropriate antiplatelet therapy was given as well as bivalirudin.  We elected to give bivalirudin because of the time for the sheath pull  would correspond roughly with the time  to remove the sheath from the  standpoint of the enoxaparin.  Bivalirudin was given according to  protocol and ACT checked and found to be appropriate.   Following this, we used a load of 3.5 guide catheter which fit nicely  into the left main.  We chose a floppy wire which required a fairly  steep bend.  And, this was taken down into the distal circumflex.  A pre-  dilatation was done with a 2.25-mm balloon with fairly marked  improvement in the appearance of the artery overall.  Following this, we  carefully placed a 16 x 2.5-cm TAXUS Express II drug-eluting stent.  This was taken to the site and carefully positioned.  Balloon dilatation  was then done up to about 12-13 atmospheres.  We went under the usual  dilatation pressure largely because of the size of the vessel distally.  This was followed by a 2.75 Quantum Maverick balloon which was taken  throughout the course of the stent, and post dilatations were done as  high as up to 13-14 atmospheres with a noncompliant balloon.  Post  angiographic views revealed a widely patent vessel with excellent TIMI  III flow.  Final shots were obtained after  removal of the wire.  The  guides were removed and the femoral sheath was sewn into place.  The  patient had been enrolled in the [place] protocol, and received  appropriate P2Y12 inhibitor according to protocol.  She was taken to the  holding area in satisfactory clinical condition.   HEMODYNAMIC DATA.:  1. Central aortic pressure was 121/81, mean 99.  2. Left ventricular pressure 129/14.  3. No gradient on pullback across the aortic valve.   ANGIOGRAPHIC DATA:  1. Ventriculography in the RAO projection reveals vigorous global      systolic function.  No segmental abnormalities contraction were      identified.  2. The left main was free of critical disease.  3. The LAD and diagonal system demonstrate no critical stenoses.      There is perhaps mild luminal irregularities with perhaps  20%      narrowing involving the ostium of the LAD.  The diagonals appear      free of critical disease.  4. The circumflex has a tiny first marginal, an insignificant second      marginal, a small third marginal, a large fourth marginal, and a      moderate size fifth marginal.  Just prior to the fifth marginal      takeoff, there is a ruptured plaque of 90% which is new from the      previous study.  This overlays a small AV branch which goes to a      posterolateral branch.  Following the stenting procedure, this was      reduced from 90% down to 0% with an excellent angiographic      appearance.  5. The right coronary artery provides a single posterior descending      branch.  There is perhaps 20% mid-narrowing after the RV branch,      but no high-grade stenoses.   CONCLUSION:  1. Preserved left ventricular function.  2. Compared to the previous study, there is now a ruptured plaque in      the distal circumflex with successful percutaneous stenting, we      elected a drug-eluting stent because of her insulin dependent      diabetes mellitus.   DISPOSITION:  The patient will need aspirin and Plavix.  The Plavix will  be recommended for a minimum of 1 year.  Following this, this will be at  the discretion of the managing cardiologist.      Loretha Brasil. Lia Foyer, MD, [facility]  Electronically Signed     TDS/MEDQ  D:  03/11/2006  T:  03/11/2006  Job:  [id number]   cc:   Loretha Brasil. Lia Foyer, MD, [facility]  CV Laboratory  Carlena Bjornstad, MD, [facility]  Dhruv Woody Seller

## 2010-08-02 NOTE — Assessment & Plan Note (Signed)
[facility] OFFICE NOTE   [name], [name]                       MRN:          [id number]  DATE:04/27/2006                            DOB:          February 10, 1951    PRIMARY CARDIOLOGIST:  Dr. Terald Sleeper.   REASON FOR VISIT:  Scheduled 2 week followup.  Please refer to my clinic  note of January 25 for full details.   Since last seen in the clinic, the patient reports no further  breakthrough angina pectoris since being placed on low dose Imdur.  Of  note, she has also since initiated cardiac rehab and is currently in her  second week.  She has not had any associated chest discomfort during her  routine exercises.   The patient does report, however, a persistent high basal heart rate,  which had also been noted at cardiac rehab, reportedly with some initial  heart rates in the 120 range.   The patient has not taken any sublingual nitroglycerine  since her last  office visit.   CURRENT MEDICATIONS:  As previously noted, with adjustments of aspirin  325 daily, metoprolol ER 50 b.i.d.  and addition of Imdur 30 daily.   PHYSICAL EXAMINATION:  Blood pressure 112/72, pulse 93, regular, weight  239.4.  GENERAL:  60 year old female, obese, sitting upright, in no distress.  NECK:  Palpable bilateral carotid pulses without bruits.  LUNGS:  Clear to auscultation, all fields.  HEART:  Regular rate and rhythm (S1, S2), no significant murmurs.  No  rubs.  ABDOMEN:  Protuberant, nontender.  EXTREMITIES:  1-2+ bilateral, non-pitting edema.  NEURO:  No focal deficits.   IMPRESSION:  1. Severe single vessel coronary artery disease.      a.     Status post non-ST elevation MI/TAXUS stenting distal CFX       December 2007.      b.     Question intermittent coronary vasospasm -- presently       quiescent on Imdur.      c.     With residual nonobstructive CAD.      d.     Normal left ventricular function.  2. High  basal heart rate.  3. Insulin-dependent diabetes mellitus.  4. Hypertension.      a.     Improved with recent adjustment of medications.  5. Chronic lower extremity edema.  6. Morbid obesity.  7. Obstructive sleep apnea.  8. Anxiety/depression.   PLAN:  1. Adjust current medication regimen with the following:      discontinuation of Lotrel and addition of Cardizem CD 120 daily for      treatment of high basal heart rate as well as treatment of possible      intermittent coronary vasospasm.  We have elected to not further up      titrate the beta blocker, for treatment of the high basal heart      rate, given that the patient may, in fact, be experiencing symptoms      from coronary vasospasm.  2. Down titrate Lotensin from 20 to 10  daily, to avoid relative      hypotension.  3. Check a TSH level, if none drawn recently, for further evaluation      of persistent high basal heart rate.  4. Schedule clinic followup with myself and Dr. Dannielle Burn in one month.      Gene Serpe, PA-C  Electronically Signed      Emily Mcmurray, MD,FACC  Electronically Signed   GS/MedQ  DD: 04/27/2006  DT: 04/27/2006  Job #: [id number]   cc:   Jerene Bears

## 2010-08-02 NOTE — Op Note (Signed)
NAMELOURDEZ, MCGAHAN                ACCOUNT NO.:  [id number]   MEDICAL RECORD NO.:  [id number]          PATIENT TYPE:  AMB   LOCATION:  DAY                           FACILITY:  [facility]   PHYSICIAN:  Hildred Laser, M.D.    DATE OF BIRTH:  [birth year]   DATE OF PROCEDURE:  01/09/2006  DATE OF DISCHARGE:                                 OPERATIVE REPORT   PROCEDURE:  Colonoscopy.   INDICATION:  [name] is a 60 year old Caucasian female with multiple medical  problems, who has chronic and intractable diarrhea felt to be due to IBS,  but she has not responded to therapy even though she is on two different  narcotics for pain control.  Family history is also positive for colon  carcinoma in her mother.  She is undergoing colonoscopy both for diagnostic  and high-risk screening purposes.  The procedure risks were reviewed with  the patient, informed consent was obtained.   MEDICATIONS FOR CONSCIOUS SEDATION:  Fentanyl 25 mcg IV, Versed 15 mg IV,  Phenergan 12.5 mg IV.   FINDINGS:  Procedure performed in endoscopy suite.  The patient's vital  signs and O2 saturation were monitored during procedure and remained stable.  She could never be sedated but appeared to be comfortable during the  procedure.  The patient was placed in the left lateral recumbent position  and rectal examination performed.  No abnormality noted on external or  digital exam.  The Olympus video scope was placed in the rectum and advanced  under vision into sigmoid colon and beyond.  Preparation was excellent.  Scope was passed into cecum, which was identified by ileocecal valve, and  the blunt end of the cecum was also well-seen.  As the scope was withdrawn,  colonic mucosa was carefully examined and was normal throughout.  Random  biopsies were taken from the sigmoid colon looking for collagenous and/or  microscopic colitis.  The rectal mucosa similarly was normal.  The scope was  retroflexed to examine anorectal junction,  which was unremarkable.  Endoscope was straightened and withdrawn.  The patient tolerated the  procedure well.   FINAL DIAGNOSIS:  Normal colonoscopy.   Biopsies taken from sigmoid colon looking for microscopic colitis.   RECOMMENDATIONS:  She will resume her usual diet and medications but take 20  units of NovoLog 70/30 this morning rather than 27, and thereafter she can  go back on her usual schedule.   If the biopsy is negative for microscopic or collagenous colitis, may  consider Lotronex.      Hildred Laser, M.D.  Electronically Signed     NR/MEDQ  D:  01/09/2006  T:  01/10/2006  Job:  [id number]   cc:   Jerene Bears  Fax: [phone]

## 2010-08-02 NOTE — Assessment & Plan Note (Signed)
[facility]                          [facility] OFFICE NOTE   CENIA, ZARAGOSA                       MRN:          [id number]  DATE:06/17/2006                            DOB:          09-21-1950    CARDIOLOGIST:  Ernestine Mcmurray, MD,FACC.   PRIMARY CARE PHYSICIAN:  Dhruv [name].   HISTORY OF PRESENT ILLNESS:  [name] is a very pleasant 60 year old  female patient who presents to the office today for follow-up.  She was  last seen April 27, 2006.  Her recent history includes a non-ST  segment elevation myocardial infarction in December 2007, treated with a  TAXUS drug eluting stent to the distal circumflex.  At catheterization  she had residual nonobstructive disease.  There was also a question of  coronary vasospasm.  When she was seen in follow-up in January, she was  placed on Imdur as well as increased dose of beta-blocker and seen back  on April 27, 2006.  She denied any recurrent chest pain at that time  but did note increased basal heart rates.  The decision was made to  place her on Cardizem 120 mg a day and keep her beta-blocker at the same  dose.  Her Lotrel was also changed to just benazepril 10 mg a day.  She  returns today for follow-up.  She denies any further palpitations.  She  denies any chest pain or shortness of breath.  Denies any syncope or  near syncope.  Denies any orthopnea or paroxysmal nocturnal dyspnea.  She does note she recently fell at a [employer].  She had some  back pain and leg pain.  She has not yet seen her primary care doctor  and apparently the business where she fell has their insurance working  with her to help her with her medical claims.  Of note, she did have a  TSH after seen last in the office and this was normal at 2.52.   CURRENT MEDICATIONS:  1. Humalog sliding scale.  2. Kadian 80 mg b.i.d.  3. Cholestyramine packets.  4. Aspirin 325 mg daily.  5. Isosorbide ER 30 mg  daily.  6. Crestor 10 mg daily.  7. Benazepril 10 mg a day.  8. Diltiazem ER 120 mg daily.  9. Prevacid 30 mg b.i.d.  10.Requip 1 mg daily.  11.Metoprolol ER 50 mg daily.  12.Pangestyme EC two tablets b.i.d.  13.Xanax 1 mg four times a day.  14.Potassium 10 mEq b.i.d.  86.VHQIONGEX patch.  16.Folic acid 1 mg daily.  17.B12 shots.  18.Cymbalta 60 mg twice daily.  19.Bumetanide 1 mg daily.  20.Premarin 1.25 mg daily.  21.Plavix 75 mg daily.  22.Levemir.  23.Flextend 40 units b.i.d.  24.Phenergan p.r.n.  25.Hydrocodone p.r.n.  26.Nitroglycerin p.r.n.   ALLERGIES:  ADDERALL and VIRILON.   PHYSICAL EXAMINATION:  GENERAL APPEARANCE:  She is a well-developed,  well-nourished female in no acute distress.  VITAL SIGNS:  Blood pressure 128/80, pulse 78, weight 235 pounds.  HEENT:  Unremarkable.  NECK:  Without JVD.  LUNGS:  Clear to auscultation bilaterally.  CARDIOVASCULAR:  S1 and S2, regular rate and rhythm.  ABDOMEN:  Soft and nontender.  EXTREMITIES:  With 1+ edema bilaterally.  Calves are soft and nontender.  SKIN:  Warm and dry.   IMPRESSION:  1. Coronary artery disease.      a.     Status post non-ST segment elevation myocardial infarction,       treated with TAXUS drug eluting stent to the distal circumflex       December 2007.      b.     Residual nonobstructive coronary artery disease.      c.     Angina secondary to possible coronary vasospasm - quiescent       on Imdur and calcium channel blocker therapy.  2. Good left ventricular function.  3. Insulin-dependent diabetes mellitus.  4. Hypertension.  5. Chronic lower extremity edema.  6. Morbid obesity.  7. Obstructive sleep apnea.  8. Anxiety/depression.  9. Dyslipidemia with high triglycerides.  10.Gastroesophageal reflux disease.  11.History of pancreatitis.  12.Recent fall with resulting low back pain.   PLAN:  The patient is doing well from a symptomatic standpoint.  Her  heart rate is much better  controlled.  She is feeling well without any  further chest pain or palpitations.  I have recommended continuing her  current medical regimen at this point in time.  I have also recommended  she get back to see her primary care physician for her low back pain and  recent  fall.  Will make sure she has lipids and LFTs checked in the next  several weeks and has follow-up with Dr. Dannielle Burn in the next three  months.      Richardson Dopp, PA-C  Electronically Signed      Satira Sark, MD  Electronically Signed   SW/MedQ  DD: 06/17/2006  DT: 06/17/2006  Job #: [id number]   cc:   Jerene Bears

## 2010-08-02 NOTE — Op Note (Signed)
[name], KIRCHOFF NO.:  [id number]   MEDICAL RECORD NO.:  [id number]          PATIENT TYPE:  OIB   LOCATION:  2550                         FACILITY:  [facility]   PHYSICIAN:  Satira Anis. Gramig III, M.D.DATE OF BIRTH:  [birth year]   DATE OF PROCEDURE:  08/08/2004  DATE OF DISCHARGE:                                 OPERATIVE REPORT   PREOPERATIVE DIAGNOSIS:  Nonunion/malunion, right distal radius, with  chronic pain and significant stiffness in the digital regions (fingers and  thumb).   POSTOPERATIVE DIAGNOSIS:  Nonunion/malunion, right distal radius, with  chronic pain and significant stiffness in the digital regions (fingers and  thumb).   PROCEDURES:  1.  Takedown, nonunion/malunion, right distal radius, and open reduction and      internal fixation with volar Hand Innovations plate and screw construct,      with Symphony bone graft being used.  2.  Stress radiography.  3.  Manipulation under anesthesia, metacarpophalangeal, proximal      interphalangeal, distal interphalangeal joints about the thumb, index,      middle, ring and small fingers.  4.  Distal radioulnar joint manipulation under anesthesia with breaking up      significant adhesions and restoring full supination capability secondary      to contracture.   SURGEON:  Satira Anis. Amedeo Plenty, M.D.   ASSISTANT:  Avelina Laine, P.A.-C.   COMPLICATIONS:  None.   ANESTHESIA:  General.   TOURNIQUET TIME:  Less than an hour.   DRAINS:  One.   INDICATION FOR PROCEDURE:  This is an unfortunate lady who sustained  bilateral volar Barton's fractures.  She has gone on to achieve radiographic  consolidation about the left wrist but does have some displacement noted.  The patient also has supination loss about the left wrist.  The right wrist  has a malunion/nonunion about the right distal radius with significant loss  of digital motion and supination loss.  I have discussed the risks and  benefits of  surgery, including risks of infection, bleeding, anesthesia,  damage to normal structures, and failure of surgery to accomplish its  intended goals of relieving symptoms and restoring function.  With this in  mind, she desires to proceed.  All questions have been encouraged and  answered preoperatively.  I have discussed with her the pre- and postop  algorithm.  I have discussed with her that there are certainly no guarantees  but that hopefully we would be able to realign her wrist to stable  radiographic parameters, which would help her in terms of her management of  osteoarthritis in the future and try and afford her restoration of function.  I have discussed with her the do's and don'ts, risks and benefits, etc.   OPERATIVE PROCEDURE:  The patient was seen by myself and anesthesia.  The  correct extremity to be operated on was identified in the preop area.  She  was given Ancef preoperatively, was given a full discussion by  anesthesiology.  Dr. Ola Spurr placed a central line in the right jugular  region.  Following this, the patient was  taken to the operative suite and  underwent smooth induction of general anesthesia and placed supine, fully  padded, prepped and draped in the usual sterile fashion with Betadine scrub  and paint.  Following this, the patient underwent manipulation under  anesthesia of the PIP, DIP and MCP joints of the index to small finger as  well as the IP and MCP joint of the thumb, taking care to avoid iatrogenic  fracture.  This was done, restoring full range of motion.  There was  significant arthrofibrosis noted due to immobility.  Following this, I then  performed manipulation of the wrist in terms of supination, freeing up  contracture and adhesions, and restoring her supination ability for the most  part.  I did not overly aggressively supinate her, as she did have a  malunion/nonunion which contributed to incongruity here.  Once this was  done, under 150  mm of tourniquet control, a volar midline approach was made,  FCR tendon sheath was split.  The retractors were placed and the carpal  canal contents were retracted in an ulnar direction.  I then incised the  pronator quadratus, accessed the malunion/nonunion, and once this was done I  mobilized the distal fragment.  The patient had a large amount of volar  displacement, incongruity, ulnocarpal abutment, and displacement with a  nonunited area.  This was confirmed with CT scan, of course, preoperatively.  I took down the area.  Once this was done, I then filled the gap with  Symphony bone graft utilizing IC graft chamber as well as local bone graft  from the adjacent structures.  The patient tolerated this well.  Once this  was done, I then pre-bent a Hand [employer]ations plate and applied this without  difficulty.  Distal peg fixation was accomplished, taking care to avoid the  joint, and proximal screws were placed without difficulty.  This aligned the  fracture relatively nicely, restored the volar displacement.  She had volar  tilt, which was neutral, and was nicely packed in terms of her bone-  grafting.  Fixation was stable.  She was taken through live fluoro and range  of motion revealing good stability.  I was pleased with the findings.  I  deflated the tourniquet, repaired the pronator, following this I irrigated  copiously as I did at multiple points during the procedure, and then closed  the subcu with Vicryl and the skin edge with Prolene, and a #7 TLS drain was  placed, hooked up to suction, and hemostasis was excellent at completion of  the procedure.  Compartments were soft, refill was brisk, and she was then  placed in a short-arm splint.  We will begin immediate range of motion,  including pronation and supination, finger flexion and extension.  We will  monitor her condition quite closely.  She will be admitted for observation, diabetic control, and other measures as  appropriate to her needs.  I have  discussed with her do's and don'ts, risks and benefits in the postoperative  period and have discussed all findings with the family.   The patient and I had a lengthy preoperative course, and she understands the  necessary postoperative measures.   I did evaluate the left upper extremity at the time of surgery.  This was  stable and will be aggressively rehabilitated in the therapeutic arena.      WMG/MEDQ  D:  08/08/2004  T:  08/09/2004  Job:  [id number]   cc:   Kyra Leyland, M.D.  Jerene Bears  [address]  [place]  [place] [address]  Fax: [phone]

## 2010-08-02 NOTE — Assessment & Plan Note (Signed)
[name]   [name], [name]                       MRN:          [id number]  DATE:04/10/2006                            DOB:          26-Apr-1950    PRIMARY CARDIOLOGIST:  Dr. Terald Sleeper.   REASON FOR VISIT:  Post hospital followup.   [name] is a 60 year old female, with a history of nonobstructive  coronary artery disease by previous cardiac catheterizations, who  returned to [facility] on Christmas day with complaint of chest  pain. She was referred to Dr. Ron Parker in consultation and was found to have  upward trending troponins with ongoing chest pain. She was thus referred  to [facility] for emergent coronary angiography.   The patient was found to have subtotal occlusion with ruptured plaque in  the distal CFX, successfully treated with TAXUS stenting by Dr. Lia Foyer.  Residual anatomy revealed nonobstructive CAD and left ventricular  function was vigorous with no wall motion abnormalities.   The patient was discharged home the following day on Plavix with  recommendations to continue at least 1 year. Of Hopkins, she was discharged  on low dose aspirin.   Since her release, she feels overall improvement in her energy level,  but states that she continues to have the same kind of chest discomfort  which preceded her recent hospitalization. Of Hopkins, she continues to  have some mild anterior chest discomfort with ambulation, although she  is somewhat limited in her mobility. More concerning, however, are 2  episodes of early morning chest discomfort, similar to those in the past  but much less intense for which she had to take 2 nitroglycerin tablets  on each occasion. She reported complete relief in approximately 10  minutes after the second nitroglycerin and had no recurrent episodes  later those 2 days. Her last episode was approximately 1 week ago, and  the first episode was  approximately 2 weeks ago.   Electrocardiogram today reveals NSR at 88 BPM with normal axis and no  ischemic changes.   CURRENT MEDICATIONS:  1. Plavix.  2. Aspirin 81 daily.  3. Prevacid 30 b.i.d.  4. Requip 1 mg daily.  5. Lotrel 4/20.  6. Metoprolol ER 50 daily.  7. Pangestyme EC 2 tablets b.i.d.  8. Xanax 1 mg q.i.d.  9. Potassium 10 mEq b.i.d.  40.JWJXBJYN patch.  11.Folic acid.  12.Vitamin B12 injection q. monthly.  13.Cymbalta 60 b.i.d.  14.Bumetanide 1 mg daily.  15.Premarin 1.25 daily.  16.Zocor 40 daily.  17.Levemir 40 units b.i.d.  18.Humalog sliding scale as directed.  19.Kadian 80 mg b.i.d.  20.Cholestyramine packet daily.   PHYSICAL EXAMINATION:  VITAL SIGNS:  Blood pressure 132/80, pulse 92,  regular, weight 231.8.  GENERAL:  A 60 year old female, obese, sitting upright in no distress.  HEENT:  Normocephalic, atraumatic.  NECK:  Palpable bilateral carotid pulses without bruits.  LUNGS:  Clear to auscultation in all fields.  HEART:  Regular rate and rhythm (S1, S2), no significant murmurs. Soft  S4.  ABDOMEN:  Protuberant, nontender.  EXTREMITIES:  Right groin  stable with no hematoma, ecchymosis, or bruit  on auscultation; intact femoral pulse; 1+ bilateral pedal edema.  NEUROLOGIC:  No focal deficit.   IMPRESSION:  1. Recurrent angina pectoris.  2. Severe single-vessel coronary artery disease.      a.     Status post non ST elevation myocardial infarction secondary       to ruptured plaque of the distal CFX/TAXUS stenting.      b.     Normal left ventricular function.      c.     Prior history of nonobstructive coronary artery disease by       multiple cardiac catheterizations.  3. Insulin-dependent diabetes mellitus.  4. Hyperlipidemia.  5. Obesity.  6. Hypertension.  7. Chronic lower extremity edema.  8. Gastroesophageal reflux disease.  9. History of pancreatitis.  10.Sleep apnea.  11.Anxiety/depression.   PLAN:  1. Up-titrate aspirin to  full dose at 325 daily for more aggressive      management of possible breakthrough angina pectoris.  2. Add Imdur 30 mg daily.  3. Increase metoprolol ER to 50 b.i.d. for antianginal effect, blood      pressure control, and      treatment of high basal heart rate.  4. Schedule early followup with myself and Dr. Dannielle Burn in 2 weeks.      Gene Serpe, PA-C  Electronically Signed      Ernestine Mcmurray, MD,FACC  Electronically Signed   GS/MedQ  DD: 04/10/2006  DT: 04/10/2006  Job #: [id number]   cc:   Jerene Bears

## 2010-08-02 NOTE — Consult Note (Signed)
NAME:  [name], [name]                            ACCOUNT NO.:  [id number]   MEDICAL RECORD NO.:  [id number]                  PATIENT TYPE:   LOCATION:                                       FACILITY:   PHYSICIAN:  Hildred Laser, M.D.                 DATE OF BIRTH:  [birth year]   DATE OF CONSULTATION:  10/18/2003  DATE OF DISCHARGE:                                   CONSULTATION   CHIEF COMPLAINT:  Abdominal pain.   REFERRING PHYSICIAN:  Jerene Bears, M.D.   HISTORY OF PRESENT ILLNESS:  [name] is a 60 year old Caucasian female  who presents today for further evaluation of abdominal pain.  She has a  history of pancreatitis secondary to hypertriglyceridemia.  She has been  hospitalized on four occasions, once in 2000, 2002, 2003, and 2004.  She  states that she has constant abdominal pain.  She has lower abdominal  cramping prior to defecation.  This pain resolves after having a bowel  movement.  She also complains of upper abdominal pain throughout the entire  upper abdomen.  This pain is of chronic nature as well.  Nothing seems to  make the pain worse or better.  She has regular daily nausea and sometimes  vomits in the morning.  She vomits bilious fluid at times.  She complains of  a bitter taste in her mouth.  She is unsure if she is having typical  heartburn symptoms.  She states she has constant pain in her chest related  to costochondritis.  She says she feels like she has food becoming stuck in  her lower esophageal region.  It is primarily to solids but sometimes  liquids.  She has choking spells at times as well.  This may or may not be  related to eating.  She has constipation and is now currently on MiraLax.  She is having regular bowel movements at this point, sometimes has multiple  bowel movements daily.  She denies any melena or rectal bleeding.  Her most  recent hemoglobin A1c was 6.4 per her report.  She tells me that Dr. Woody Seller  wants her to have the EGD.  She  believes she has had a previous colonoscopy;  however, I do not see a report in our chart.   CURRENT MEDICATIONS:  1. Gemfibrozil 1000 mg b.i.d.  2. Glipizide 5 mg b.i.d.  3. Effexor 75 mg b.i.d.  4. Prevacid 30 mg b.i.d.  5. Methadone 10 mg two tablets t.i.d.  6. Premarin 0.625 mg daily.  7. Premarin cream area by night.  8. Lasix 40 mg daily.  9. Duragesic patch 50 mcg, change q.3d.  10.      Phenergan p.r.n.  11.      MiraLax 17 g daily.   ALLERGIES:  DEMEROL.   PAST MEDICAL HISTORY:  1. History of pancreatitis secondary to hypertriglyceridemia.  We saw her  back in 2000 with this.  She was also in the hospital in 2002 with     abdominal pain, but she did not clearly have any pancreatitis at this     time.  She reports having chronic pain and the last episode requiring     hospitalization for pancreatitis was in 2004.  2. She has a history of chronic gastroesophageal reflux disease.  Her last     EGD was by Dr. Buford Dresser, July 2002.  This reveals nodules on the cardia,     biopsy revealed benign gastric epithelium, mild nonspecific glandular     dilatation irregularity.  Gastric ___________ study was normal.  3. She has diabetes mellitus for 15 years.  4. She has peripheral neuropathy and is taking methadone for this.  5. She has dyslipidemia/hypertriglyceridemia.  6. Depression.  7. History of CHF at childbirth in 1990, at which time she was also     diagnosed with hypertension.   PAST SURGICAL HISTORY:  1. Cholecystectomy for biliary dyskinesia.  2. Hysterectomy.  3. EGD also in 1996 with Dr. Laural Golden revealed small sliding hiatal hernia,     gastritis with CLO test negative.   FAMILY HISTORY:  Mother had a history of colon cancer with surgery at age  85, died six months later of an MI.  She also had a history of leukemia.  Father died of acute MI at age 24.  She has a sister and brother who has had  stroke.  Brother died of lung cancer at age 28.  She says she has some  aunts  and uncles who have had history of colon cancer.   SOCIAL HISTORY:  She is married for 26 years.  She had four children, but  one son is deceased secondary to AIDS.  He was a hemophiliac.  She is a  housewife.  She has never been a smoker, denies any alcohol use.   REVIEW OF SYSTEMS:  Please see HPI for GI/  GENITOURINARY:  Denies any  dysuria, hematuria, urinary frequency.  CARDIOPULMONARY:  Denies any  shortness of breath.  Complains of constant chest pain, but feels is usually  related to costochondritis.  She states that she is being tested for sleep  apnea next week.   PHYSICAL EXAMINATION:  VITAL SIGNS:  Weight 237 pounds, height 5 feet 6  inches, blood pressure 120/80, pulse 90.  GENERAL:  Pleasant, obese, Caucasian female in no acute distress.  SKIN:  Warm and dry, no jaundice.  HEENT:  Pupils equal, round, and reactive to light.  Conjunctivae pink,  sclerae nonicteric.  Oropharyngeal mucosa moist and pink.  No lesions,  erythema, or exudate.  NECK:  No lymphadenopathy or thyromegaly.  CHEST:  Lungs clear to auscultation.  CARDIAC:  Reveals regular rate and rhythm, normal S1/S2.  No murmurs, rubs,  or gallops.  ABDOMEN:  Positive bowel sounds, soft.  Obese but symmetrical.  She has  diffuse tenderness to deep palpation as well as to light palpation.  Subjective guarding.  No rebound tenderness.  No splenomegaly or masses.  Liver edge is easily palpable.  Blood in the right costal margin in the  midclavicular line.  This is smooth.  EXTREMITIES:  No edema.   IMPRESSION:  [name] is a pleasant 60 year old lady who presents for  further evaluation of abdominal pain in the setting of known history of  pancreatitis related to hypertriglyceridemia.  Some of her abdominal pain  could be related to chronic pancreatitis.  Symptoms  are quite nonspecific,  as she has diffuse abdominal pain.  Some of her lower abdominal cramping may be due to irritable bowel syndrome, it is  relieved with defecation.  She is  having daily epigastric pain associated with nausea and occasional vomiting.  She is on double dose of PTI therapy and still symptomatic.  Still it is  very reasonable to perform an upper endoscopy for further evaluation of  these symptoms.  Also discussed with her that given her history of chronic  diabetes mellitus, nausea and poorly controlled reflux could possibly be due  to gastroparesis.  She is also complaining of some solid food esophageal  dysphagia.   Family history strongly positive for colon cancer.   PLAN:  1. EGD, plus or minus ED in the near future.  2. CT of the abdomen and pelvis with IV and oral contrast.  3. LFTs, MET-7, amylase, lipase, CBC.  4. To continue Prevacid 30 mg b.i.d.  5. She may decrease MiraLax to 17 g every other day or a half a dose every     day.  6. Will request previous colonoscopy report done at [facility], patient believes she has had a previous exam, although is not     entirely sure.  If she has not had a recent colonoscopy, would highly     recommend one, given strong family history of colon cancer.     ________________________________________  ___________________________________________  Neil Crouch, P.A.                         Hildred Laser, M.D.   LL/MEDQ  D:  10/18/2003  T:  10/18/2003  Job:  [id number]   cc:   Hildred Laser, M.D.  [address]  [place]  [place] [address]  Fax: [address]  [place]  [place] [address]  Fax: [phone]

## 2010-08-02 NOTE — Cardiovascular Report (Signed)
NAMESYNA, GAD NO.:  [id number]   MEDICAL RECORD NO.:  [id number]          PATIENT TYPE:  INP   LOCATION:  2094                         FACILITY:  [facility]   PHYSICIAN:  Glori Bickers, M.D. LHCDATE OF BIRTH:  1950-06-09   DATE OF PROCEDURE:  07/18/2005  DATE OF DISCHARGE:  07/18/2005                              CARDIAC CATHETERIZATION   PRIMARY CARE PHYSICIAN:  Dr. Jerene Bears in [place].   CARDIOLOGIST:  Dr. Dannielle Burn.   PATIENT IDENTIFICATION:  [name] is a 60 year old woman, with a long  history of diabetes, obesity and hypertension, who was admitted with a  several-day history of constant, severe chest tightness.  She ruled out for  myocardial infarction with serial cardiac enzymes.  She also underwent a CT  scan of the chest to rule out pulmonary embolus at [facility] which  was reportedly negative, although I do not have the formal report in front  of me.  Given her ongoing chest pain, she was referred for diagnostic  catheterization.  Of note, she has had several catheterizations in the past  showing normal coronary arteries.   PROCEDURES PERFORMED:  1.  Selective coronary angiography.  2.  Left heart cath.  3.  Left ventriculogram.   DESCRIPTION OF PROCEDURE:  The risks and benefits of catheterization were  explained.  Consent was signed and placed on the chart.  A 6-French arterial  sheath was placed in the right femoral artery using a modified Seldinger  technique.  Standard catheters including preformed Judkins JL-4 and JR-4  were used for the catheterization.  All catheter exchanges made over wire.  There were no apparent complications.   Central aortic pressure 167/97 with a mean of 128.  LV was 137/12 with LVEDP  of 17.   Left main had a distal 20% stenosis leading into the LAD.   LAD long vessel wrapping the apex gave off two diagonals. The first one was  small, the second one was moderate.  There was a 30% stenosis in the  ostium  of this first diagonal.  Otherwise, angiographically normal.   Left circumflex is a large codominant vessel.  It gave off a small OM-1,  moderate-sized OM-2 and two large posterolaterals.  There was no  angiographic CAD.   Right coronary artery was a large codominant vessel which terminated in the  PDA.  It also gave off a moderate-sized right ventricular branch.  There was  no angiographic CAD.   Left ventriculogram done in the RAO position showed an EF of 55% with no  wall motion abnormalities or mitral regurgitation.   ASSESSMENT:  1.  Minimal nonobstructive coronary artery disease as described above.  2.  Normal left ventricular function.   PLAN/DISCUSSION:  The etiology of her chest pain remains unclear to me.  It  does not appear to be cardiac.  Could consider an outpatient GI workup.  She  will likely be suitable for discharge later today.      Glori Bickers, M.D. [facility]  Electronically Signed     DB/MEDQ  D:  07/18/2005  T:  07/19/2005  Job:  [id number]   cc:   Jerene Bears  Fax: [phone]   Ernestine Mcmurray, M.D. [facility]  [address] [place]  [place] [address]

## 2010-08-02 NOTE — Discharge Summary (Signed)
NAMESHEYLIN, SCHARNHORST NO.:  [id number]   MEDICAL RECORD NO.:  [id number]          PATIENT TYPE:  INP   LOCATION:  6525                         FACILITY:  [facility]   PHYSICIAN:  Shaune Pascal. Bensimhon, MDDATE OF BIRTH:  [birth year]   DATE OF ADMISSION:  03/11/2006  DATE OF DISCHARGE:  03/12/2006                         DISCHARGE SUMMARY - REFERRING   DISCHARGE DIAGNOSES:  1. Non ST segment elevation myocardial infarction.  2. Progressive coronary artery disease.  3. Status post Taxus stenting to the distal circumflex.  4. Hypertension.  5. Obesity.  6. Hyperglycemia with a history of diabetes.  History as noted below.   PROCEDURE PERFORMED:  Cardiac catheterization on March 11, 2006 by  Dr. Lia Foyer status post Taxus stenting to the distal circumflex.  Normal  EF.   HISTORY:  [name] is a 60 year old female who was admitted to  [facility] on March 10, 2006 with chest discomfort.  Dr. Ron Parker was  consulted on December 26.  Dr. Ron Parker felt that she was very difficult to  assess and her blood work was abnormal, thus she was transferred for  further evaluation with cardiac catheterization.   PAST MEDICAL HISTORY:  Her history is notable for multiple prior cardiac  catheterization, the last being in May, 2007, which showed a normal LV  function, nonobstructive coronary artery disease.  Her history is also  notable for diabetes, anxiety and recurrent pancreatitis.   LABORATORY DATA:  Admission weight 108.1 kg.  On December 27 H&H is 11.5  and 33.6, normal indices, platelets 264, WBCs 5.4.  Sodium 140,  potassium 3.4, BUN 5, creatinine 0.6, glucose 186.  Normal LFTs.  Total  protein and albumin are low at 5.5 and 2.4.  Calcium 7.9.  CK-MB and  relative indices at [facility] within normal limits.  Troponins slightly  abnormal at 1.28 and 1.10.  EKGs at [facility] show normal sinus  rhythm and borderline first-degree AV block.   HOSPITAL COURSE:  Ms. Vacca  underwent cardiac catheterization by Dr.  Lia Foyer on March 11, 2006.  This revealed a 20% proximal LAD, 20%  proximal RCA and a 99% distal circumflex.  Dr. Lia Foyer performed Taxus  stenting to the distal circumflex reducing this from 95% to 0%.  Post  sheath removal and bed rest, patient was ambulating without difficulty.  Cardiac rehab saw in consultation.  Overnight she did well.  Dr.  Haroldine Laws, on assessment on December 27, felt that she could be  discharged home.   DISPOSITION:  Patient is discharged home and asked to maintain a low  salt, low fat and low cholesterol ADA diet.  She was asked to bring all  medications to all followup appointments.  Activities and wound care per  supplemental discharge sheet.   DISCHARGE MEDICATIONS:  New prescriptions include:  1. Plavix 75 mg daily that she needs to take at least for one year.  2. Zocor 40 mg at bedtime.  3. Continue home medications which include extended release metoprolol      50 mg daily.  4. Lotrel 5/20 daily.  5. Aspirin 81 mg daily.  6. Pangestyme EC 1 tablet b.i.d.  7. Folic acid 1 mg daily.  8. Keppra 500 mg b.i.d.  9. Prevacid 30 mg b.i.d.  10.Diazepam 10 mg at bedtime.  11.Requip 2 mg b.i.d.  12.Cymbalta 60 mg 2 tabs daily.  13.Levemir 2 times daily as previously.  14.B12 monthly.  15.Bumex 1 mg 2 times a day.  16.Potassium 20 mEq b.i.d.  17.Alprazolam as needed.  18.Phenergan as needed.  19.Morphine as needed.  20.Hydrocodone as needed.  21.Allegra as previously.  22.Premarin as previously.   FOLLOWUP:  She will follow up with Dr. Dannielle Burn on April 10, 2006 at  1:30 p.m.  She will follow up with Dr. Woody Seller as needed.   DISCHARGE TIME:  Total 35 minutes.      Sharyl Nimrod, PA-C      Shaune Pascal. Bensimhon, MD  Electronically Signed    EW/MEDQ  D:  03/12/2006  T:  03/12/2006  Job:  [id number]   cc:   Mackey Birchwood, MD,FACC

## 2010-08-02 NOTE — Op Note (Signed)
[name], [name] NO.:  [id number]   MEDICAL RECORD NO.:  [id number]                   PATIENT TYPE:   LOCATION:                                       FACILITY:   PHYSICIAN:  Hildred Laser, M.D.                 DATE OF BIRTH:   DATE OF PROCEDURE:  11/02/2003  DATE OF DISCHARGE:                                 OPERATIVE REPORT   PROCEDURE:  Esophagogastroduodenoscopy.   INDICATIONS FOR PROCEDURE:  [name] is a 60 year old Caucasian female with  multiple medical problems who is also on Duragesic and methadone along with  a number of other medications who has unrelenting generalized abdominal pain  with nausea and intermittent vomiting.  She has chronic GERD, and she is  also on Prevacid b.i.d. but not any better.  She has a history of  pancreatitis secondary to hyperlipidemia.  She had a CT at [facility] last week  which did not show any changes of pancreatitis.  The tail of the pancreas  was noted to be prominent.  This is unchanged from a study of three years  ago.  She also complains of solid food dysphagia.  She is therefore  undergoing diagnostic/therapeutic EGD.  The procedure risks were reviewed  with the patient, and informed consent for the procedure was obtained.   PREOPERATIVE MEDICATIONS:  Cetacaine spray for pharyngeal topical  anesthesia, Demerol 50 mg IV, Versed 14 mg IV.   FINDINGS:  The procedure was performed in the endoscopy suite.  The  patient's vital signs and O2 saturations were monitored during the procedure  and remained stable.  The patient was placed in the left lateral recumbent  position, and the Olympus videoscope was passed via the oropharynx without  any difficulty into the esophagus.   Esophagus:  The mucosa of the esophagus was normal.  The squamocolumnar  junction was wavy, and there was a small sliding hiatal hernia.  No ring or  stricture was noted.   Stomach:  It was empty and distended very well with  insufflation.  The folds  of the proximal stomach were normal.  Examination of  the mucosa revealed  linear streaks of erythematous mucosa the antrum and a few erosions.  No  ulcer crater was found.  The pyloric channel was patent.  The angularis,  fundus, and cardia were examined by retroflexing the scope and were normal.   Duodenum:  Examination of the bulb revealed normal mucosa.  The scope was  passed to the second part of the duodenum where the mucosa and folds were  normal.  The endoscope was withdrawn.   The esophagus was dilated, given history of dysphagia, by passing a 56  [place] Maloney dilator.  As the dilator was withdrawn, the endoscope was  passed again, and there was a small linear tear at the cervical esophagus.  The endoscope was withdrawn.  The patient tolerated the  procedure reasonably  well, although she was complaining of nausea and heaving and was given 4 mg  of Zofran IV with relief of her symptoms.   FINAL DIAGNOSES:  1. Small sliding hiatal hernia and erosive antral gastritis.  2. Esophagus dilated, given history of dysphagia.  A 56 French Maloney     dilator was passed, resulting in a small tear in the cervical esophagus     which may indicate a web.   RECOMMENDATIONS:  1. H pylori serology will be checked today.  2. Levsin/SL q.i.d. p.r.n.  3. Solid food gastric emptying study to be performed at [facility].  The patient     needs to be off of her Levsin for at least 24 hours before this study is     done.      ___________________________________________                                            Hildred Laser, M.D.   NR/MEDQ  D:  11/02/2003  T:  11/02/2003  Job:  [id number]   cc:   Dr. Alisa Hopkins

## 2010-08-02 NOTE — Discharge Summary (Signed)
[name], [name] NO.:  [id number]   MEDICAL RECORD NO.:  [id number]          PATIENT TYPE:  INP   LOCATION:  3403                         FACILITY:  [facility]   PHYSICIAN:  Glori Bickers, M.D. LHCDATE OF BIRTH:  26-Sep-1950   DATE OF ADMISSION:  07/17/2005  DATE OF DISCHARGE:  07/18/2005                                 DISCHARGE SUMMARY   DISCHARGING CARDIOLOGIST:  Dr. Glori Bickers.   PRIMARY CARE CARDIOLOGIST:  Dr. Dannielle Burn.   DISCHARGING DIAGNOSIS:  Chest pain, unclear etiology.   Procedures performed during this hospitalization include heart  catheterization on Jul 18, 2005, SCA, left heart cath and LV gram.   HISTORY OF PRESENT ILLNESS:  This is a 60 year old female with previous  history of diabetes mellitus approximately 10 years, hypertension, history  of cardiac catheterization in 1995 who had presented to [facility] and was seen by Dr. Dannielle Burn.  Dr. Dannielle Burn was unclear whether this  was coronary artery disease or a esophagogastric pain.  The patient was sent  to [facility] for cardiac catheterization.   On Jul 18, 2005, the patient had a cardiac catheterization performed by Dr.  Haroldine Laws which showed minimal nonobstructive coronary artery disease and a  normal LV.  Dr. Haroldine Laws stated the patient could be discharged home if  groin remained stable.  The patient will be discharged home this afternoon.   The patient will be discharged on the following medications aspirin 325 mg  daily, Protonix 40 mg b.i.d., Lyrica 100 mg b.i.d., Requip 10 mg b.i.d.,  Levbid 0.35 mg daily, Premarin 1.60 mg daily, Phenergan 25 mg daily,  Cymbalta 60 mg daily, KCl 10 mEq daily, _________ 42 units q.h.s., Carafate  1 gram daily, Lotrel 5/20 daily.  The patient was also given a discharge  instruction sheet stating to evaluate her groin for any pain, fever or  discharge and to call the office if she has that.  The patient will also be  discharged home on  a heart healthy, low-fat, low-cholesterol diet.  Total  time of dictation was less than 30 minute.     ______________________________  April Humphrey, NP      Glori Bickers, M.D. [facility]  Electronically Signed    AH/MEDQ  D:  07/18/2005  T:  07/19/2005  Job:  [id number]

## 2010-08-19 ENCOUNTER — Other Ambulatory Visit: Payer: Self-pay | Admitting: *Deleted

## 2010-08-19 MED ORDER — CLOPIDOGREL BISULFATE 75 MG PO TABS: 75.0000 mg | ORAL_TABLET | Freq: Every day | ORAL | Status: DC

## 2010-08-29 ENCOUNTER — Encounter: Payer: Self-pay | Admitting: Cardiology

## 2010-10-15 ENCOUNTER — Ambulatory Visit (INDEPENDENT_AMBULATORY_CARE_PROVIDER_SITE_OTHER): Payer: Medicaid Other | Admitting: Cardiology

## 2010-10-15 ENCOUNTER — Encounter: Payer: Self-pay | Admitting: Cardiology

## 2010-10-15 VITALS — BP 96/59 | HR 113 | Resp 20 | Ht 67.0 in | Wt 248.8 lb

## 2010-10-15 DIAGNOSIS — E785 Hyperlipidemia, unspecified: Secondary | ICD-10-CM

## 2010-10-15 DIAGNOSIS — E1142 Type 2 diabetes mellitus with diabetic polyneuropathy: Secondary | ICD-10-CM

## 2010-10-15 DIAGNOSIS — F32A Depression, unspecified: Secondary | ICD-10-CM

## 2010-10-15 DIAGNOSIS — F341 Dysthymic disorder: Secondary | ICD-10-CM

## 2010-10-15 DIAGNOSIS — I2510 Atherosclerotic heart disease of native coronary artery without angina pectoris: Secondary | ICD-10-CM

## 2010-10-15 DIAGNOSIS — I5032 Chronic diastolic (congestive) heart failure: Secondary | ICD-10-CM

## 2010-10-15 DIAGNOSIS — R002 Palpitations: Secondary | ICD-10-CM

## 2010-10-15 DIAGNOSIS — E119 Type 2 diabetes mellitus without complications: Secondary | ICD-10-CM

## 2010-10-15 DIAGNOSIS — F329 Major depressive disorder, single episode, unspecified: Secondary | ICD-10-CM | POA: Insufficient documentation

## 2010-10-15 DIAGNOSIS — E1149 Type 2 diabetes mellitus with other diabetic neurological complication: Secondary | ICD-10-CM

## 2010-10-15 DIAGNOSIS — E1159 Type 2 diabetes mellitus with other circulatory complications: Secondary | ICD-10-CM | POA: Insufficient documentation

## 2010-10-15 NOTE — Assessment & Plan Note (Signed)
Patient is on high-dose diuretic therapy. She still reports some edema but this appears to be dependent edema. Her heart rate is actually elevated and she is very thirsty if anything she is dehydrated and I told her to increase her fluid intake, use compression stockings and elevate her legs as much as possible. There is no indication to further increase her diuretics.

## 2010-10-15 NOTE — Assessment & Plan Note (Signed)
Multiple medications.

## 2010-10-15 NOTE — Progress Notes (Signed)
HPI The patient is a 60 year old female with a history of coronary artery disease status post stent placement to the circumflex coronary artery in 2007. She had a repeat cardiac catheterization 2008 which showed mild nonobstructive disease of the circumflex and right coronary artery. Her ejection fraction was normal at 65%. The patient continues on Plavix. Last year the patient was admitted to [facility] and ruled out for myocardial infarction. Echocardiogram was essentially within normal limits. The patient a followup visit 09/20/2009 and was doing fine. The patient has a history of anxiety and depression as well as a long-standing history of diabetes mellitus with severe diabetic peripheral neuropathy. She is on several medications for this. She's actually a combination SSRI and SNRI. The patient states that she has shortness of breath but this is stable. She has lost some weight. She has obstructive sleep apnea. She denies any chest pain. Her EKG shows no acute changes. Her heart rate however is elevated but she says that she stays thirsty a lot. Her lower extremity edema is mainly due to to dependent stasis there is no evidence of heart failure. From a cardiac perspective the patient otherwise is stable.  Allergies  Allergen Reactions  . Adderall     REACTION: swelling  . Pregabalin     REACTION: swelling  . Topiramate     Current Outpatient Prescriptions on File Prior to Visit  Medication Sig Dispense Refill  . ALPRAZolam (XANAX) 1 MG tablet Take 1 mg by mouth 4 (four) times daily.        Marland Kitchen aspirin 81 MG tablet Take 81 mg by mouth daily.        . benazepril (LOTENSIN) 20 MG tablet Take 20 mg by mouth daily.        . clopidogrel (PLAVIX) 75 MG tablet Take 1 tablet (75 mg total) by mouth daily.  30 tablet  2  . cyanocobalamin (,VITAMIN B-12,) 1000 MCG/ML injection Inject 1,000 mcg into the muscle every 30 (thirty) days.        Marland Kitchen diltiazem (TIAZAC) 180 MG 24 hr capsule Take 180 mg by  mouth daily.        . DULoxetine (CYMBALTA) 60 MG capsule Take 60 mg by mouth 2 (two) times daily.        Marland Kitchen estrogens, conjugated, (PREMARIN) 1.25 MG tablet Take 1.25 mg by mouth daily.        Marland Kitchen HYDROcodone-acetaminophen (LORTAB) 10-500 MG per tablet Take 1 tablet by mouth every 6 (six) hours as needed.        . insulin glargine (LANTUS) 100 UNIT/ML injection as directed.        . insulin lispro (HUMALOG) 100 UNIT/ML injection SS before meals as needed       . lidocaine (XYLOCAINE) 5 % ointment as directed.        . morphine (MS CONTIN) 60 MG 12 hr tablet Take 60 mg by mouth 2 (two) times daily.        . nitroGLYCERIN (NITROSTAT) 0.4 MG SL tablet Place 0.4 mg under the tongue every 5 (five) minutes as needed.        Marland Kitchen omeprazole (PRILOSEC) 20 MG capsule Take 20 mg by mouth 2 (two) times daily.        . Pancrelipase, Lip-Prot-Amyl, (CREON) 24000 UNITS CPEP Take 2 capsules by mouth 2 (two) times daily.        . potassium chloride (K-DUR) 10 MEQ tablet Take 10 mEq by mouth 2 (two) times daily.        Marland Kitchen  pravastatin (PRAVACHOL) 80 MG tablet Take 1 tablet (80 mg total) by mouth daily. PATIENT NEED OFFICE VISIT/CHOLESTEROL LABS  30 tablet  1  . promethazine (PHENERGAN) 25 MG tablet Take 25 mg by mouth daily.        . sertraline (ZOLOFT) 100 MG tablet Take 100 mg by mouth daily.        Marland Kitchen torsemide (DEMADEX) 20 MG tablet Take 40 mg by mouth 2 (two) times daily.          Past Medical History  Diagnosis Date  . Palpitations   . Tachycardia   . CAD (coronary artery disease)     Stent placement circumflex coronary 2007, catheterization 2008 patent stents. Normal LV function  . Chest pain   . Diabetes mellitus     Insulin dependent  . Dyslipidemia   . Anxiety and depression   . Obstructive sleep apnea   . Diabetic polyneuropathy      severe on multiple medications  . Hemophilia A carrier     Past Surgical History  Procedure Date  . Hand surgery     right    Family History  Problem  Relation Age of Onset  . Cancer    . Coronary artery disease      History   Social History  . Marital Status: Single    Spouse Name: N/A    Number of Children: N/A  . Years of Education: N/A   Occupational History  . Disabled    Social History Main Topics  . Smoking status: Never Smoker   . Smokeless tobacco: Not on file  . Alcohol Use: No  . Drug Use: No  . Sexually Active: Not on file   Other Topics Concern  . Not on file   Social History Narrative   Patient does not get regular exercise   [id number] positives as outlined above. The remainder of the 18  point review of systems is negative  PHYSICAL EXAM BP 96/59  Pulse 113  Resp 20  Ht 5' 7"  (1.702 m)  Wt 248 lb 12.8 oz (112.855 kg)  BMI 38.97 kg/m2  SpO2 96%  General: Overweight white female in no distress  Head: Normocephalic and atraumatic Eyes:PERRLA/EOMI intact, conjunctiva and lids normal Ears: No deformity or lesions Mouth:normal dentition, normal posterior pharynx Neck: Supple, no JVD.  No masses, thyromegaly or abnormal cervical nodes Lungs: Normal breath sounds bilaterally without wheezing.  Normal percussion Cardiac: regular rate and rhythm with normal S1 and S2, no S3 or S4.  PMI is normal.  No pathological murmurs Abdomen: Normal bowel sounds, abdomen is soft and nontender without masses, organomegaly or hernias noted.  No hepatosplenomegaly MSK: Back normal, normal gait muscle strength and tone normal Vascular: Pulse is normal in all 4 extremities Extremities: 1+ lower extremity peripheral pitting edema  Neurologic: Alert and oriented x 3 Skin: Intact without lesions or rashes Lymphatics: No significant adenopathy Psychologic: Normal affect  ECG: Normal sinus rhythm with no acute ischemic changes.  ASSESSMENT AND PLAN

## 2010-10-15 NOTE — Assessment & Plan Note (Signed)
Patient's blood work done with her primary care physician

## 2010-10-15 NOTE — Patient Instructions (Signed)
Continue all current medications. Your physician wants you to follow up in: 6 months.  You will receive a reminder letter in the mail one-two months in advance.  If you don't receive a letter, please call our office to schedule the follow up appointment   

## 2010-10-15 NOTE — Assessment & Plan Note (Signed)
No recurrent chest pain. Continue medical therapy continue dual antiplatelet therapy. Patient ruled out for myocardial infarction a year ago. Preserved ejection fraction.

## 2010-10-17 ENCOUNTER — Ambulatory Visit (INDEPENDENT_AMBULATORY_CARE_PROVIDER_SITE_OTHER): Payer: Self-pay | Admitting: Internal Medicine

## 2010-10-17 DIAGNOSIS — K859 Acute pancreatitis without necrosis or infection, unspecified: Secondary | ICD-10-CM | POA: Insufficient documentation

## 2010-10-17 NOTE — Progress Notes (Unsigned)
Subjective:     Patient ID: [name], female   DOB: October 19, 1950, 60 y.o.   MRN: [id number]  HPI Referral from Dr. Sherril Croon for abdominal pain. She is due for a colonoscopy 12/16/2010  Hx of pancreatitis  08/08/2010 Glucose 174, BUN 6, Creatinine 0.51, Calcium 7.3, total protein 6.4, Albumin 2.7, ALP 69, AST 22, total bili 0.6, ALT 12, NA 139, K 3.2, Chloride 100, Amylase 18, Lipase 28, 08/09/2010 Amylase 30, H and H 12.8 and 38,8, MCV 83.4, Platelet ct 365 CT abdomen and pelvis with contrast 08/06/2010: No acute abnormalities identified within the abdomen or pelvis. Minimal soft tissue stranding at the anterior abdominal wall in the left lower quadrant may reflect mild soft tissue injury; minimal soft tissue edema posteriorly along the lower back. Review of Systems     Objective:   Physical Exam     Assessment:     ***    Plan:     ***

## 2010-11-20 ENCOUNTER — Other Ambulatory Visit: Payer: Self-pay | Admitting: *Deleted

## 2010-11-20 MED ORDER — CLOPIDOGREL BISULFATE 75 MG PO TABS: 75.0000 mg | ORAL_TABLET | Freq: Every day | ORAL | Status: DC

## 2010-12-06 LAB — COMPREHENSIVE METABOLIC PANEL
ALT: 15
AST: 15
Albumin: 2.3 — ABNORMAL LOW
Alkaline Phosphatase: 60
BUN: 4 — ABNORMAL LOW
CO2: 28
Calcium: 8.1 — ABNORMAL LOW
Chloride: 105
Creatinine, Ser: 0.52
GFR calc Af Amer: >60
GFR calc non Af Amer: >60
Glucose, Bld: 167 — ABNORMAL HIGH
Potassium: 4.1
Sodium: 139
Total Bilirubin: 0.6
Total Protein: 5 — ABNORMAL LOW

## 2010-12-06 LAB — DIFFERENTIAL
Basophils Absolute: 0
Basophils Relative: 0
Eosinophils Absolute: 0.1
Eosinophils Relative: 1
Lymphocytes Relative: 37
Lymphs Abs: 2.3
Monocytes Absolute: 0.3
Monocytes Relative: 5
Neutro Abs: 3.5
Neutrophils Relative %: 56

## 2010-12-06 LAB — CBC
HCT: 34 — ABNORMAL LOW
HCT: 40.3
HCT: 34.8 — ABNORMAL LOW
Hemoglobin: 11.5 — ABNORMAL LOW
Hemoglobin: 13.5
Hemoglobin: 11.8 — ABNORMAL LOW
MCHC: 33.6
MCHC: 33.7
MCHC: 33.9
MCV: 84.2
MCV: 84.4
MCV: 84.5
Platelets: 224
Platelets: 241
Platelets: 217
RBC: 4.04
RBC: 4.77
RBC: 4.12
RDW: 13.9
RDW: 14.3
RDW: 13.9
WBC: 6.2
WBC: 6.7
WBC: 4.9

## 2010-12-06 LAB — BASIC METABOLIC PANEL
BUN: 9
CO2: 27
Calcium: 8.7
Chloride: 102
Creatinine, Ser: 0.46
GFR calc Af Amer: >60
GFR calc non Af Amer: >60
Glucose, Bld: 210 — ABNORMAL HIGH
Potassium: 3.9
Sodium: 138

## 2010-12-06 LAB — HEMOGLOBIN A1C
Hgb A1c MFr Bld: 8.1 — ABNORMAL HIGH
Mean Plasma Glucose: 211

## 2011-01-02 LAB — BASIC METABOLIC PANEL
BUN: 5 — ABNORMAL LOW
CO2: 32
Calcium: 8.3 — ABNORMAL LOW
Chloride: 102
Creatinine, Ser: 0.55
GFR calc Af Amer: >60
GFR calc non Af Amer: >60
Glucose, Bld: 154 — ABNORMAL HIGH
Potassium: 4.3
Sodium: 141

## 2011-01-02 LAB — APTT
aPTT: 33
aPTT: 33

## 2011-01-02 LAB — DIFFERENTIAL
Basophils Absolute: 0
Basophils Relative: 0
Eosinophils Absolute: 0
Eosinophils Relative: 1
Lymphocytes Relative: 35
Lymphs Abs: 2.7
Monocytes Absolute: 0.4
Monocytes Relative: 5
Neutro Abs: 4.7
Neutrophils Relative %: 60

## 2011-01-02 LAB — LIPID PANEL
Cholesterol: 196
HDL: 47
LDL Cholesterol: UNDETERMINED
Total CHOL/HDL Ratio: 4.2
Triglycerides: 902 — ABNORMAL HIGH
VLDL: UNDETERMINED

## 2011-01-02 LAB — CBC
HCT: 35.5 — ABNORMAL LOW
HCT: 40.8
Hemoglobin: 12
Hemoglobin: 13.6
MCHC: 33.4
MCHC: 33.8
MCV: 82.4
MCV: 83.5
Platelets: 282
Platelets: 328
RBC: 4.31
RBC: 4.89
RDW: 14.5 — ABNORMAL HIGH
RDW: 14.6 — ABNORMAL HIGH
WBC: 6.2
WBC: 7.8

## 2011-01-02 LAB — CK TOTAL AND CKMB (NOT AT ARMC)
CK, MB: 0.6
Relative Index: INVALID
Total CK: 53

## 2011-01-02 LAB — CARDIAC PANEL(CRET KIN+CKTOT+MB+TROPI)
CK, MB: 0.6
CK, MB: 0.7
Relative Index: INVALID
Relative Index: INVALID
Total CK: 29
Total CK: 39
Troponin I: 0.01
Troponin I: 0.01

## 2011-01-02 LAB — COMPREHENSIVE METABOLIC PANEL
ALT: 13
AST: 26
Albumin: 3.1 — ABNORMAL LOW
Alkaline Phosphatase: 60
BUN: 8
CO2: 25
Calcium: 8.8
Chloride: 100
Creatinine, Ser: 0.54
GFR calc Af Amer: >60
GFR calc non Af Amer: >60
Glucose, Bld: 109 — ABNORMAL HIGH
Potassium: 4.1
Sodium: 139
Total Bilirubin: 0.9
Total Protein: 6.4

## 2011-01-02 LAB — PROTIME-INR
INR: 1
Prothrombin Time: 13.4

## 2011-01-02 LAB — TROPONIN I: Troponin I: 0.01

## 2011-01-02 LAB — TSH: TSH: 1.232

## 2011-01-06 ENCOUNTER — Other Ambulatory Visit: Payer: Self-pay | Admitting: *Deleted

## 2011-01-06 MED ORDER — BENAZEPRIL HCL 20 MG PO TABS: 20.0000 mg | ORAL_TABLET | Freq: Every day | ORAL | Status: DC

## 2011-01-20 ENCOUNTER — Other Ambulatory Visit: Payer: Self-pay | Admitting: *Deleted

## 2011-01-20 MED ORDER — DILTIAZEM HCL ER BEADS 180 MG PO CP24: 180.0000 mg | ORAL_CAPSULE | Freq: Every day | ORAL | Status: DC

## 2011-01-24 ENCOUNTER — Other Ambulatory Visit: Payer: Self-pay | Admitting: *Deleted

## 2011-01-24 MED ORDER — NITROGLYCERIN 0.4 MG SL SUBL: 0.4000 mg | SUBLINGUAL_TABLET | SUBLINGUAL | Status: DC | PRN

## 2011-02-19 ENCOUNTER — Encounter (INDEPENDENT_AMBULATORY_CARE_PROVIDER_SITE_OTHER): Payer: Self-pay | Admitting: *Deleted

## 2011-03-19 ENCOUNTER — Telehealth (INDEPENDENT_AMBULATORY_CARE_PROVIDER_SITE_OTHER): Payer: Self-pay | Admitting: *Deleted

## 2011-03-19 ENCOUNTER — Encounter (INDEPENDENT_AMBULATORY_CARE_PROVIDER_SITE_OTHER): Payer: Self-pay | Admitting: *Deleted

## 2011-03-19 ENCOUNTER — Other Ambulatory Visit (INDEPENDENT_AMBULATORY_CARE_PROVIDER_SITE_OTHER): Payer: Self-pay | Admitting: *Deleted

## 2011-03-19 DIAGNOSIS — Z800 Family history of malignant neoplasm of digestive organs: Secondary | ICD-10-CM

## 2011-03-19 MED ORDER — PEG-KCL-NACL-NASULF-NA ASC-C 100 G PO SOLR: 1.0000 | Freq: Once | ORAL | Status: DC

## 2011-03-19 NOTE — Telephone Encounter (Signed)
Patient needs movi prep 

## 2011-03-21 ENCOUNTER — Encounter (INDEPENDENT_AMBULATORY_CARE_PROVIDER_SITE_OTHER): Payer: Self-pay | Admitting: *Deleted

## 2011-04-09 ENCOUNTER — Telehealth (INDEPENDENT_AMBULATORY_CARE_PROVIDER_SITE_OTHER): Payer: Self-pay | Admitting: *Deleted

## 2011-04-09 NOTE — Telephone Encounter (Signed)
Requesting MD/PCP:  [name]     Name & DOB: [name] Jan 17, 2051     Procedure: TCS  Reason/Indication:  FH CRC  Has patient had this procedure before?  YES  If so, when, by whom and where?  2007  Is there a family history of colon cancer?  YES  Who?  What age when diagnosed?  MOTHER  Is patient diabetic?   YES      Does patient have prosthetic heart valve?  NO  Do you have a pacemaker?  NO  Has patient had joint replacement within last 12 months?  NO  Is patient on Coumadin, Plavix and/or Aspirin? YES  Medications: ASA 81 MG DAILY, DILTIAZEM 180 MG DAILY, BENAZEPRIL  20 MG DAILY, TORSEMIDE 20 MG 2 TAB DAILY, GABAPENTIN 800 MG 1 TAB TID, POTASSIUM 10 MG BID, OMEPRAZOLE 20 MG BID, CYMBALTA 60 MG BID, PREMARIN 1.25 MG DAILY, PRAVASTATIN 80 MG DAILY, CREON 24000 UNIT 2 CAP BID, ZOLOFT 50 MG DAILY, CLOPIDOGREL 75 MG DAILY (Dr Degent), ALPRAZOLAM 1 MG QID, MORPHINE 60 MG BID, HYDROCODONE 10/500 MG QID OR PRN, HUMALOG SLIDING SCALE, LANTUS 60 MG BID  Allergies: Shelly Bombard   Pharmacy: [facility]  Medication Adjustment: ASA 2 DAYS, CLOPIDOGREL 5 DAYS  Procedure date & time: 04/24/11 @ 730

## 2011-04-11 ENCOUNTER — Encounter (HOSPITAL_COMMUNITY): Payer: Self-pay | Admitting: Pharmacy Technician

## 2011-04-11 NOTE — Telephone Encounter (Signed)
agree

## 2011-04-18 MED ORDER — FENTANYL CITRATE 0.05 MG/ML IJ SOLN
INTRAMUSCULAR | Status: AC
  Filled 2011-04-18: qty 2

## 2011-04-18 MED ORDER — GLYCOPYRROLATE 0.2 MG/ML IJ SOLN
INTRAMUSCULAR | Status: AC
  Filled 2011-04-18: qty 1

## 2011-04-18 MED ORDER — ONDANSETRON HCL 4 MG/2ML IJ SOLN
INTRAMUSCULAR | Status: AC
  Filled 2011-04-18: qty 2

## 2011-04-18 MED ORDER — MIDAZOLAM HCL 2 MG/2ML IJ SOLN
INTRAMUSCULAR | Status: AC
  Filled 2011-04-18: qty 2

## 2011-06-03 ENCOUNTER — Telehealth (INDEPENDENT_AMBULATORY_CARE_PROVIDER_SITE_OTHER): Payer: Self-pay | Admitting: *Deleted

## 2011-06-03 NOTE — Telephone Encounter (Signed)
Requesting MD/PCP:  vyas     Name & DOB: [name]  2050/06/18   Procedure: tcs  Reason/Indication:  fam hx colon ca  Has patient had this procedure before?  yes  If so, when, by whom and where?  2007  Is there a family history of colon cancer?  yes  Who?  What age when diagnosed?  mother  Is patient diabetic?   yes      Does patient have prosthetic heart valve?  no  Do you have a pacemaker?  no  Has patient had joint replacement within last 12 months?  no  Is patient on Coumadin, Plavix and/or Aspirin? yes  Medications: ASA 81 MG DAILY, DILTIAZEM 180 MG DAILY, BENAZEPRIL 20 MG DAILY, TORSEMIDE 20 MG 2 TAB DAILY, GABAPENTIN 800 MG 1 TAB TID, POTASSIUM 10 MG BID, OMEPRAZOLE 20 MG BID, CYMBALTA 60 MG BID, PREMARIN 1.25 MG DAILY, PRAVASTATIN 80 MG DAILY, CREON 24000 UNIT 2 CAP BID, ZOLOFT 50 MG DAILY, CLOPIDOGREL 75 MG DAILY (Dr Degent), ALPRAZOLAM 1 MG QID, MORPHINE 60 MG BID, HYDROCODONE 10/500 MG QID OR PRN, HUMALOG SLIDING SCALE, LANTUS 60 MG BID  Allergies: lyrica, adderoll, verelon, topamax  Pharmacy: [facility]  Medication Adjustment: asa 2 days, clopidogrel 5 days  Procedure date & time: 06/26/11 at 930

## 2011-06-05 NOTE — Telephone Encounter (Signed)
agree

## 2011-06-25 MED ORDER — SODIUM CHLORIDE 0.45 % IV SOLN: Freq: Once | INTRAVENOUS | Status: DC

## 2011-06-26 ENCOUNTER — Encounter (HOSPITAL_COMMUNITY): Admission: RE | Payer: Self-pay | Source: Ambulatory Visit

## 2011-06-26 ENCOUNTER — Ambulatory Visit (HOSPITAL_COMMUNITY): Admission: RE | Admit: 2011-06-26 | Payer: Medicaid Other | Source: Ambulatory Visit | Admitting: Internal Medicine

## 2011-06-26 SURGERY — COLONOSCOPY
Anesthesia: Moderate Sedation

## 2011-07-01 ENCOUNTER — Other Ambulatory Visit (INDEPENDENT_AMBULATORY_CARE_PROVIDER_SITE_OTHER): Payer: Self-pay | Admitting: *Deleted

## 2011-07-01 ENCOUNTER — Ambulatory Visit (INDEPENDENT_AMBULATORY_CARE_PROVIDER_SITE_OTHER): Payer: Medicaid Other | Admitting: Internal Medicine

## 2011-07-01 ENCOUNTER — Encounter (INDEPENDENT_AMBULATORY_CARE_PROVIDER_SITE_OTHER): Payer: Self-pay | Admitting: Internal Medicine

## 2011-07-01 VITALS — BP 130/70 | HR 116 | Temp 98.3°F | Ht 66.0 in | Wt 244.5 lb

## 2011-07-01 DIAGNOSIS — R197 Diarrhea, unspecified: Secondary | ICD-10-CM

## 2011-07-01 DIAGNOSIS — Z800 Family history of malignant neoplasm of digestive organs: Secondary | ICD-10-CM

## 2011-07-01 NOTE — Progress Notes (Signed)
Subjective:     Patient ID: [name], female   DOB: 1950/08/02, 61 y.o.   MRN: [id number]  HPIScheduled a colonoscopy on 06/26/2011 but thought it was for the 18th of this month. She is due for a surveillance colonoscopy. Her last colonoscopy was in 2007 which revealed  Positive family hx for colon cancer: mother. One brother had rectal cancer.  She has a hx of chronic diarrhea.  Appetite is not good. Weight loss 25 pounds over 3 months which was ? Unintentional. (In 2010 wt was 248. Today weight is 244).  Lower abdominal pain. She does have chronic diarrhea.  She is having on average 7-8 stools a day. She says she is up during the night going to the BR. Her BM are dark brown. No melena. She says she saw bright red blood, Thurs, Friday and Sat.  Review of Systems see hpi Current Outpatient Prescriptions  Medication Sig Dispense Refill  . ALPRAZolam (XANAX) 1 MG tablet Take 1 mg by mouth 4 (four) times daily.        Marland Kitchen aspirin EC 81 MG tablet Take 81 mg by mouth daily.      . benazepril (LOTENSIN) 20 MG tablet Take 1 tablet (20 mg total) by mouth daily.  30 tablet  6  . clopidogrel (PLAVIX) 75 MG tablet Take 1 tablet (75 mg total) by mouth daily.  30 tablet  6  . cyanocobalamin (,VITAMIN B-12,) 1000 MCG/ML injection Inject 1,000 mcg into the muscle every 30 (thirty) days.        Marland Kitchen diltiazem (TIAZAC) 180 MG 24 hr capsule Take 1 capsule (180 mg total) by mouth daily.  30 capsule  6  . DULoxetine (CYMBALTA) 60 MG capsule Take 60 mg by mouth 2 (two) times daily.        Marland Kitchen estrogens, conjugated, (PREMARIN) 1.25 MG tablet Take 1.25 mg by mouth daily.        Marland Kitchen gabapentin (NEURONTIN) 800 MG tablet Take 800 mg by mouth 3 (three) times daily.      Marland Kitchen HYDROcodone-acetaminophen (LORTAB) 10-500 MG per tablet Take 1 tablet by mouth every 6 (six) hours as needed. For pain      . insulin glargine (LANTUS) 100 UNIT/ML injection Inject 55-60 Units into the skin 2 (two) times daily. 60 units in the morning and 55  in the evening      . insulin lispro (HUMALOG) 100 UNIT/ML injection Inject 5-10 Units into the skin 2 (two) times daily. SS before breakfast and supper as needed      . morphine (MS CONTIN) 60 MG 12 hr tablet Take 60 mg by mouth 2 (two) times daily.        . nitroGLYCERIN (NITROSTAT) 0.4 MG SL tablet Place 0.4 mg under the tongue every 5 (five) minutes as needed. For chest pains      . omeprazole (PRILOSEC) 20 MG capsule Take 20 mg by mouth 2 (two) times daily.        . peg 3350 powder (MOVIPREP) 100 G SOLR Take 1 kit (100 g total) by mouth once.  1 kit  0  . potassium chloride (K-DUR) 10 MEQ tablet Take 10 mEq by mouth 2 (two) times daily.        . pravastatin (PRAVACHOL) 80 MG tablet Take 80 mg by mouth daily.      . sertraline (ZOLOFT) 100 MG tablet Take 100 mg by mouth daily.        Marland Kitchen torsemide (DEMADEX) 20 MG  tablet Take 40 mg by mouth 2 (two) times daily.       . promethazine (PHENERGAN) 25 MG tablet Take 25 mg by mouth every 6 (six) hours as needed. For nausea       Past Medical History  Diagnosis Date  . Palpitations   . Tachycardia   . CAD (coronary artery disease)     Stent placement circumflex coronary 2007, catheterization 2008 patent stents. Normal LV function  . Chest pain   . Diabetes mellitus     Insulin dependent  . Dyslipidemia   . Anxiety and depression   . Obstructive sleep apnea   . Diabetic polyneuropathy      severe on multiple medications  . 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 MI (myocardial infarction)     2007   Past Surgical History  Procedure Date  . Hand surgery     right  . Coronary angioplasty with stent placement    Family Status  Relation Status Death Age  . Mother Deceased     MI  . Father Deceased     Mi  . Sister Alive     One has had 4 strokes. Other sister has CAD   History   Social History  . Marital Status: Legally Separated    Spouse Name: N/A    Number of Children: N/A  . Years of Education: N/A   Occupational History  . Disabled      Social History Main Topics  . Smoking status: Never Smoker   . Smokeless tobacco: Not on file  . Alcohol Use: No  . Drug Use: No  . Sexually Active: Not on file   Other Topics Concern  . Not on file   Social History Narrative   Patient does not get regular exercise   Allergies  Allergen Reactions  . Amphetamine-Dextroamphet Er     REACTION: swelling  . Pregabalin     REACTION: swelling  . Topiramate Other (See Comments)    Tongue tingle       Objective:   Physical Exam Filed Vitals:   07/01/11 1451  Height: 5' 6"  (1.676 m)  Weight: 244 lb 8 oz (110.904 kg)   Alert and oriented. Skin warm and dry. Oral mucosa is moist.   . Sclera anicteric, conjunctivae is pink. Thyroid not enlarged. No cervical lymphadenopathy. Lungs clear. Heart regular rate and rhythm.  Abdomen is soft. Bowel sounds are positive. No hepatomegaly. No abdominal masses felt. No tenderness.  No edema to lower extremities. Patient is alert and oriented.      Assessment:  Chronic diarrhea. She is presenting taking Lomotil which has not helped. Family hx of colon cancer.    Plan:    Imodium BID on schedule. Colonoscopy with Dr. Laural Golden.  Increase fiber in diet.

## 2011-07-01 NOTE — Patient Instructions (Signed)
Increase fiber in diet. Imodium BID on schedule. Colonoscopy with Dr Laural Golden.

## 2011-07-02 ENCOUNTER — Encounter (INDEPENDENT_AMBULATORY_CARE_PROVIDER_SITE_OTHER): Payer: Self-pay

## 2011-07-21 ENCOUNTER — Other Ambulatory Visit: Payer: Self-pay | Admitting: *Deleted

## 2011-07-21 MED ORDER — CLOPIDOGREL BISULFATE 75 MG PO TABS: 75.0000 mg | ORAL_TABLET | Freq: Every day | ORAL | Status: DC

## 2011-07-23 ENCOUNTER — Telehealth (INDEPENDENT_AMBULATORY_CARE_PROVIDER_SITE_OTHER): Payer: Self-pay | Admitting: Internal Medicine

## 2011-07-23 DIAGNOSIS — R197 Diarrhea, unspecified: Secondary | ICD-10-CM

## 2011-07-23 MED ORDER — DICYCLOMINE HCL 10 MG PO CAPS: 10.0000 mg | ORAL_CAPSULE | Freq: Two times a day (BID) | ORAL | Status: DC

## 2011-07-23 NOTE — Telephone Encounter (Signed)
Continues to have diarrhea. Is shceduled for a colonoscopy 08/06/2011.   Will e-prescribe an Rx for Dicyclomine 8m BID. Be care not to get constipated

## 2011-07-31 ENCOUNTER — Encounter (HOSPITAL_COMMUNITY): Payer: Self-pay

## 2011-07-31 ENCOUNTER — Ambulatory Visit (INDEPENDENT_AMBULATORY_CARE_PROVIDER_SITE_OTHER): Payer: Medicaid Other | Admitting: Cardiology

## 2011-07-31 ENCOUNTER — Encounter (HOSPITAL_COMMUNITY): Payer: Self-pay | Admitting: Pharmacy Technician

## 2011-07-31 ENCOUNTER — Encounter: Payer: Self-pay | Admitting: Cardiology

## 2011-07-31 VITALS — BP 110/72 | HR 103 | Ht 67.0 in | Wt 245.0 lb

## 2011-07-31 DIAGNOSIS — K589 Irritable bowel syndrome without diarrhea: Secondary | ICD-10-CM

## 2011-07-31 DIAGNOSIS — I2510 Atherosclerotic heart disease of native coronary artery without angina pectoris: Secondary | ICD-10-CM

## 2011-07-31 DIAGNOSIS — I5032 Chronic diastolic (congestive) heart failure: Secondary | ICD-10-CM

## 2011-07-31 MED ORDER — RIFAXIMIN 550 MG PO TABS: 550.0000 mg | ORAL_TABLET | Freq: Two times a day (BID) | ORAL | Status: DC

## 2011-07-31 NOTE — Patient Instructions (Signed)
   Rifaximin 564m twice a day x 2 weeks  Continue all current medications. Your physician wants you to follow up in: 6 months.  You will receive a reminder letter in the mail one-two months in advance.  If you don't receive a letter, please call our office to schedule the follow up appointment

## 2011-08-05 MED ORDER — SODIUM CHLORIDE 0.45 % IV SOLN
Freq: Once | INTRAVENOUS | Status: AC
  Administered 2011-08-06: 14:00:00 via INTRAVENOUS

## 2011-08-06 ENCOUNTER — Encounter (HOSPITAL_COMMUNITY): Payer: Self-pay | Admitting: *Deleted

## 2011-08-06 ENCOUNTER — Encounter (HOSPITAL_COMMUNITY)
Admission: RE | Disposition: A | Discharge status: Home / Self Care | Payer: Self-pay | Source: Ambulatory Visit | Attending: Internal Medicine

## 2011-08-06 ENCOUNTER — Ambulatory Visit (HOSPITAL_COMMUNITY)
Admission: RE | Admit: 2011-08-06 | Discharge: 2011-08-06 | Disposition: A | Discharge status: Home / Self Care | Payer: Medicaid Other | Source: Ambulatory Visit | Attending: Internal Medicine | Admitting: Internal Medicine

## 2011-08-06 DIAGNOSIS — Z79899 Other long term (current) drug therapy: Secondary | ICD-10-CM | POA: Insufficient documentation

## 2011-08-06 DIAGNOSIS — Z800 Family history of malignant neoplasm of digestive organs: Secondary | ICD-10-CM | POA: Insufficient documentation

## 2011-08-06 DIAGNOSIS — R197 Diarrhea, unspecified: Secondary | ICD-10-CM

## 2011-08-06 DIAGNOSIS — D126 Benign neoplasm of colon, unspecified: Secondary | ICD-10-CM | POA: Insufficient documentation

## 2011-08-06 DIAGNOSIS — Z01812 Encounter for preprocedural laboratory examination: Secondary | ICD-10-CM | POA: Insufficient documentation

## 2011-08-06 HISTORY — PX: COLONOSCOPY: SHX5424

## 2011-08-06 HISTORY — DX: Major depressive disorder, single episode, unspecified: F32.9

## 2011-08-06 HISTORY — DX: Depression, unspecified: F32.A

## 2011-08-06 LAB — GLUCOSE, CAPILLARY: Glucose-Capillary: 171 mg/dL — ABNORMAL HIGH (ref 70–99)

## 2011-08-06 SURGERY — COLONOSCOPY
Anesthesia: Moderate Sedation

## 2011-08-06 MED ORDER — PROMETHAZINE HCL 25 MG/ML IJ SOLN
INTRAMUSCULAR | Status: AC
  Filled 2011-08-06: qty 1

## 2011-08-06 MED ORDER — MEPERIDINE HCL 50 MG/ML IJ SOLN
INTRAMUSCULAR | Status: AC
  Filled 2011-08-06: qty 1

## 2011-08-06 MED ORDER — MIDAZOLAM HCL 5 MG/5ML IJ SOLN
INTRAMUSCULAR | Status: DC | PRN
  Administered 2011-08-06 (×5): 3 mg via INTRAVENOUS

## 2011-08-06 MED ORDER — MEPERIDINE HCL 50 MG/ML IJ SOLN
INTRAMUSCULAR | Status: DC | PRN
  Administered 2011-08-06 (×2): 25 mg via INTRAVENOUS

## 2011-08-06 MED ORDER — STERILE WATER FOR IRRIGATION IR SOLN
Status: DC | PRN
  Administered 2011-08-06: 14:00:00

## 2011-08-06 MED ORDER — MIDAZOLAM HCL 5 MG/5ML IJ SOLN
INTRAMUSCULAR | Status: AC
  Filled 2011-08-06: qty 10

## 2011-08-06 MED ORDER — LOPERAMIDE HCL 2 MG PO TABS: 4.0000 mg | ORAL_TABLET | Freq: Two times a day (BID) | ORAL | Status: AC

## 2011-08-06 MED ORDER — PROMETHAZINE HCL 25 MG/ML IJ SOLN
INTRAMUSCULAR | Status: DC | PRN
  Administered 2011-08-06 (×2): 12.5 mg via INTRAVENOUS

## 2011-08-06 NOTE — H&P (Signed)
[name] is an 61 y.o. female.   Chief Complaint: Patient is here for colonoscopy. HPI: Patient is 61 year old Caucasian female with multiple medical problems who also has IBS and chronic diarrhea. Her diarrhea is change over the last 2 months. She is having upwards of 10 stools per day. On  Some days she has  20 stools. She's been tried on Lomotil, Imodium as well as dicyclomine. She also complains of abdominal pain. No history of melena or rectal rectal bleeding. She was also treated empirically with Cipro but without any benefit. Patient's last colonoscopy was in October 2007. Family history is positive for colon carcinoma in her mother was diagnosed at age 53 or 68.. 4 maternal aunts and 2 maternal uncles also died of colon carcinoma and they're all in their 31s.  Past Medical History  Diagnosis Date  . Palpitations   . Tachycardia   . CAD (coronary artery disease)     Stent placement circumflex coronary 2007, catheterization 2008 patent stents. Normal LV function  . Chest pain   . Diabetes mellitus     Insulin dependent  . Dyslipidemia   . Anxiety and depression   . Obstructive sleep apnea   . Diabetic polyneuropathy      severe on multiple medications  . 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 MI (myocardial infarction)     2007  . Anxiety   . Depression     Past Surgical History  Procedure Date  . Hand surgery     right  . Coronary angioplasty with stent placement     Family History  Problem Relation Age of Onset  . Cancer    . Coronary artery disease     Social History:  reports that she has never smoked. She has never used smokeless tobacco. She reports that she does not drink alcohol or use illicit drugs.  Allergies:  Allergies  Allergen Reactions  . Amphetamine-Dextroamphetamine Swelling  . Pregabalin Swelling  . Topiramate Other (See Comments)    Tongue tingle  . Verelan (Verapamil) Rash    Medications Prior to Admission  Medication Sig Dispense Refill  .  ALPRAZolam (XANAX) 1 MG tablet Take 1 mg by mouth 4 (four) times daily.        . benazepril (LOTENSIN) 20 MG tablet Take 1 tablet (20 mg total) by mouth daily.  30 tablet  6  . dicyclomine (BENTYL) 10 MG capsule Take 1 capsule (10 mg total) by mouth 2 (two) times daily before a meal.  60 capsule  3  . diltiazem (TIAZAC) 180 MG 24 hr capsule Take 1 capsule (180 mg total) by mouth daily.  30 capsule  6  . diphenoxylate-atropine (LOMOTIL) 2.5-0.025 MG per tablet Take 1 tablet by mouth 4 (four) times daily as needed. For diarrhea       . estrogens, conjugated, (PREMARIN) 1.25 MG tablet Take 1.25 mg by mouth daily.       Marland Kitchen gabapentin (NEURONTIN) 800 MG tablet Take 800 mg by mouth 3 (three) times daily.      Marland Kitchen HYDROcodone-acetaminophen (LORTAB) 10-500 MG per tablet Take 1 tablet by mouth every 6 (six) hours as needed. For pain      . insulin glargine (LANTUS) 100 UNIT/ML injection Inject 60-65 Units into the skin as directed. Take 65 units every morning and 60 units in the evening      . insulin lispro (HUMALOG) 100 UNIT/ML injection Inject 5-10 Units into the skin 2 (two) times daily. Take 10 units  every morning and 5 units every evening       . lidocaine (XYLOCAINE) 5 % ointment Apply 1 application topically Twice daily.      Marland Kitchen morphine (MS CONTIN) 60 MG 12 hr tablet Take 60 mg by mouth 2 (two) times daily.        . nitroGLYCERIN (NITROSTAT) 0.4 MG SL tablet Place 0.4 mg under the tongue every 5 (five) minutes x 3 doses as needed. For chest pains      . omeprazole (PRILOSEC) 20 MG capsule Take 20 mg by mouth 2 (two) times daily.        . peg 3350 powder (MOVIPREP) 100 G SOLR Take 1 kit (100 g total) by mouth once.  1 kit  0  . potassium chloride (K-DUR) 10 MEQ tablet Take 10 mEq by mouth 2 (two) times daily.        . pravastatin (PRAVACHOL) 80 MG tablet Take 80 mg by mouth daily.      . promethazine (PHENERGAN) 25 MG tablet Take 25 mg by mouth every 6 (six) hours as needed. For nausea      .  sertraline (ZOLOFT) 100 MG tablet Take 50 mg by mouth daily.       Marland Kitchen torsemide (DEMADEX) 20 MG tablet Take 20 mg by mouth 2 (two) times daily.       Marland Kitchen venlafaxine (EFFEXOR) 37.5 MG tablet Take 37.5 mg by mouth as directed. Take 1 tablet daily for 1 week, then take 1 tablet twice daily      . aspirin EC 81 MG tablet Take 81 mg by mouth daily.      . cyanocobalamin (,VITAMIN B-12,) 1000 MCG/ML injection Inject 1,000 mcg into the muscle every 30 (thirty) days.        . Pancrelipase, Lip-Prot-Amyl, (CREON PO) Take 2 capsules by mouth 3 (three) times daily.       . rifaximin (XIFAXAN) 550 MG TABS Take 1 tablet (550 mg total) by mouth 2 (two) times daily.  28 tablet  0    Results for orders placed during the hospital encounter of 08/06/11 (from the past 48 hour(s))  GLUCOSE, CAPILLARY     Status: Abnormal   Collection Time   08/06/11  1:39 PM      Component Value Range Comment   Glucose-Capillary 171 (*) 70 - 99 (mg/dL)    Comment 1 Documented in Chart      No results found.  ROS  Blood pressure 141/67, pulse 109, temperature 98.4 F (36.9 C), temperature source Oral, resp. rate 18, height 5' 7"  (1.702 m), weight 245 lb (111.131 kg), SpO2 95.00%. Physical Exam  Constitutional: She appears well-developed and well-nourished.  HENT:  Mouth/Throat: Oropharynx is clear and moist. No oropharyngeal exudate.  Eyes: Conjunctivae are normal.  Neck: No thyromegaly present.  Cardiovascular: Normal rate, regular rhythm and normal heart sounds.   No murmur heard. Respiratory: Effort normal and breath sounds normal.  GI: She exhibits no distension and no mass. There is Tenderness: mild generalized tenderness..  Musculoskeletal: She exhibits no edema.  Lymphadenopathy:    She has no cervical adenopathy.  Neurological: She is alert.  Skin: Skin is warm.     Assessment/Plan Acute on chronic diarrhea responding to therapy. Family history of colon carcinoma. Diagnostic/surveillance  colonoscopy  Vincenzo Stave U 08/06/2011, 2:06 PM

## 2011-08-06 NOTE — Op Note (Signed)
COLONOSCOPY PROCEDURE REPORT  PATIENT:  [name]  MR#:  [id number] Birthdate:  October 27, 1950, 61 y.o., female Endoscopist:  Dr. Rogene Houston, MD Referred By:  Dr. Rennis Petty B.MD Procedure Date: 08/06/2011  Procedure:   Colonoscopy  Indications:  Patient is 61 year old Caucasian female with multiple medical problems who is in for an acute on chronic diarrhea. She previously has been diagnosed with irritable bowel syndrome. She is having lot more bowel movements over the last 2 months. Family history is very significant for colon carcinoma in mother at age 60 or 54 and 6 second-degree relatives(4 maternal aunts and 2 maternal uncles). She is undergoing colonoscopy for diagnostic and surveillance purposes.  Informed Consent:  The procedure and risks were reviewed with the patient and informed consent was obtained.  Medications:  Demerol 50 mg IV Versed 15 mg IV Promethazine 25 mg IV and diluted form.  Description of procedure:  After a digital rectal exam was performed, that colonoscope was advanced from the anus through the rectum and colon to the area of the cecum, ileocecal valve and appendiceal orifice. The cecum was deeply intubated. These structures were well-seen and photographed for the record. From the level of the cecum and ileocecal valve, the scope was slowly and cautiously withdrawn. The mucosal surfaces were carefully surveyed utilizing scope tip to flexion to facilitate fold flattening as needed. The scope was pulled down into the rectum where a thorough exam including retroflexion was performed. Terminal ileum was also examined.  Findings:   Prep excellent. Normal terminal ileum. Small polyp ablated via cold biopsy from hepatic flexure. Mucosa of rest of the colon and rectum was normal. Random biopsies taken from sigmoid colon looking for collagenous or microscopic colitis  Therapeutic/Diagnostic Maneuvers Performed:  See above  Complications:  None  Cecal Withdrawal  Time:  11 minutes  Impression:  Normal terminal ileum. Small polyp ablated via cold biopsy from hepatic flexure. No endoscopic evidence of colitis. Random biopsies taken from mucosa of sigmoid colon.  Recommendations:  Patient advised to discontinue Lomotil. Imodium 4 mg by mouth twice a day. Benefiber 4 g by mouth daily. I will contact patient with results of biopsy and further recommendations.  Codie Hainer U  08/06/2011 2:58 PM  CC: Dr. Glenda Chroman., MD, MD & Dr. Rayne Du ref. provider found

## 2011-08-06 NOTE — Discharge Instructions (Addendum)
Stop Lomotil. Imodium (2 mg) 2 tablets by mouth before breakfast and before lunch. Benefiber 4 g by mouth daily. Resume other medications and diet as before No driving for 24 hours. Stool diary until office visit; keep record of frequency and consistency of stools. Physician will contact you with results of biopsy and further recommendations .Colon Polyps A polyp is extra tissue that grows inside your body. Colon polyps grow in the large intestine. The large intestine, also called the colon, is part of your digestive system. It is a long, hollow tube at the end of your digestive tract where your body makes and stores stool. Most polyps are not dangerous. They are benign. This means they are not cancerous. But over time, some types of polyps can turn into cancer. Polyps that are smaller than a pea are usually not harmful. But larger polyps could someday become or may already be cancerous. To be safe, doctors remove all polyps and test them.  WHO GETS POLYPS? Anyone can get polyps, but certain people are more likely than others. You may have a greater chance of getting polyps if:  You are over 50.   You have had polyps before.   Someone in your family has had polyps.   Someone in your family has had cancer of the large intestine.   Find out if someone in your family has had polyps. You may also be more likely to get polyps if you:   Eat a lot of fatty foods.   Smoke.   Drink alcohol.   Do not exercise.   Eat too much.  SYMPTOMS  Most small polyps do not cause symptoms. People often do not know they have one until their caregiver finds it during a regular checkup or while testing them for something else. Some people do have symptoms like these:  Bleeding from the anus. You might notice blood on your underwear or on toilet paper after you have had a bowel movement.   Constipation or diarrhea that lasts more than a week.   Blood in the stool. Blood can make stool look black or it  can show up as red streaks in the stool.  If you have any of these symptoms, see your caregiver. HOW DOES THE DOCTOR TEST FOR POLYPS? The doctor can use four tests to check for polyps:  Digital rectal exam. The caregiver wears gloves and checks your rectum (the last part of the large intestine) to see if it feels normal. This test would find polyps only in the rectum. Your caregiver may need to do one of the other tests listed below to find polyps higher up in the intestine.   Barium enema. The caregiver puts a liquid called barium into your rectum before taking x-rays of your large intestine. Barium makes your intestine look white in the pictures. Polyps are dark, so they are easy to see.   Sigmoidoscopy. With this test, the caregiver can see inside your large intestine. A thin flexible tube is placed into your rectum. The device is called a sigmoidoscope, which has a light and a tiny video camera in it. The caregiver uses the sigmoidoscope to look at the last third of your large intestine.   Colonoscopy. This test is like sigmoidoscopy, but the caregiver looks at all of the large intestine. It usually requires sedation. This is the most common method for finding and removing polyps.  TREATMENT   The caregiver will remove the polyp during sigmoidoscopy or colonoscopy. The polyp is then  tested for cancer.   If you have had polyps, your caregiver may want you to get tested regularly in the future.  PREVENTION  There is not one sure way to prevent polyps. You might be able to lower your risk of getting them if you:  Eat more fruits and vegetables and less fatty food.   Do not smoke.   Avoid alcohol.   Exercise every day.   Lose weight if you are overweight.   Eating more calcium and folate can also lower your risk of getting polyps. Some foods that are rich in calcium are milk, cheese, and broccoli. Some foods that are rich in folate are chickpeas, kidney beans, and spinach.   Aspirin  might help prevent polyps. Studies are under way.  Document Released: 11/28/2003 Document Revised: 02/20/2011 Document Reviewed: 05/05/2007 [facility] Patient Information 2012 [place].

## 2011-08-12 ENCOUNTER — Encounter (INDEPENDENT_AMBULATORY_CARE_PROVIDER_SITE_OTHER): Payer: Self-pay | Admitting: *Deleted

## 2011-08-12 ENCOUNTER — Encounter (HOSPITAL_COMMUNITY): Payer: Self-pay | Admitting: Internal Medicine

## 2011-08-15 ENCOUNTER — Other Ambulatory Visit: Payer: Self-pay | Admitting: Cardiology

## 2011-08-24 DIAGNOSIS — K589 Irritable bowel syndrome without diarrhea: Secondary | ICD-10-CM | POA: Insufficient documentation

## 2011-08-24 NOTE — Progress Notes (Signed)
Emily Real, MD, [facility]IM Board Certified in Adult Cardiovascular Medicine,Internal Medicine and Critical Care Medicine    CC: follow up patient with history of coronary artery disease and prior stent placement  HPI:  The patient is doing well from a cardiac perspective.  She denies any significant chest pain.  On rare occasions she has used nitroglycerin.  She has no shortness of breath either at rest or on exertion.  She has had diarrhea for several weeks and underwent colonoscopy which was nonrevealing.  However the patient has a diagnosis of irritable bowel syndrome with diarrhea. There is no evidence of volume overload currently.  Dual antiplatelet therapy has been discontinued.I also told the patient that certainly sertraline could be contributing to her diarrhea and she may consider switching to an SNRI drug only    PMH: reviewed and listed in Problem List in Electronic Records (and see below) Past Medical History  Diagnosis Date  . Palpitations   . Tachycardia   . CAD (coronary artery disease)     Stent placement circumflex coronary 2007, catheterization 2008 patent stents. Normal LV function  . Chest pain   . Diabetes mellitus     Insulin dependent  . Dyslipidemia   . Anxiety and depression   . Obstructive sleep apnea   . Diabetic polyneuropathy      severe on multiple medications  . 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 MI (myocardial infarction)     2007  . Anxiety   . Depression    Past Surgical History  Procedure Date  . Hand surgery     right  . Coronary angioplasty with stent placement   . Colonoscopy 08/06/2011    Procedure: COLONOSCOPY;  Surgeon: Rogene Houston, MD;  Location: [facility];  Service: Endoscopy;  Laterality: N/A;  215    Allergies/SH/FHX : available in Electronic Records for review  Allergies  Allergen Reactions  . Amphetamine-Dextroamphetamine Swelling  . Pregabalin Swelling  . Topiramate Other (See Comments)    Tongue tingle  . Verelan  (Verapamil) Rash   History   Social History  . Marital Status: Legally Separated    Spouse Name: N/A    Number of Children: N/A  . Years of Education: N/A   Occupational History  . Disabled    Social History Main Topics  . Smoking status: Never Smoker   . Smokeless tobacco: Never Used  . Alcohol Use: No  . Drug Use: No  . Sexually Active: Not on file   Other Topics Concern  . Not on file   Social History Narrative   Patient does not get regular exercise   Family History  Problem Relation Age of Onset  . Cancer    . Coronary artery disease      Medications: Current Outpatient Prescriptions  Medication Sig Dispense Refill  . ALPRAZolam (XANAX) 1 MG tablet Take 1 mg by mouth 4 (four) times daily.        Marland Kitchen aspirin EC 81 MG tablet Take 81 mg by mouth daily.      . benazepril (LOTENSIN) 20 MG tablet Take 1 tablet (20 mg total) by mouth daily.  30 tablet  6  . cyanocobalamin (,VITAMIN B-12,) 1000 MCG/ML injection Inject 1,000 mcg into the muscle every 30 (thirty) days.        Marland Kitchen dicyclomine (BENTYL) 10 MG capsule Take 1 capsule (10 mg total) by mouth 2 (two) times daily before a meal.  60 capsule  3  .  estrogens, conjugated, (PREMARIN) 1.25 MG tablet Take 1.25 mg by mouth daily.       Marland Kitchen gabapentin (NEURONTIN) 800 MG tablet Take 800 mg by mouth 3 (three) times daily.      Marland Kitchen HYDROcodone-acetaminophen (LORTAB) 10-500 MG per tablet Take 1 tablet by mouth every 6 (six) hours as needed. For pain      . insulin glargine (LANTUS) 100 UNIT/ML injection Inject 60-65 Units into the skin as directed. Take 65 units every morning and 60 units in the evening      . insulin lispro (HUMALOG) 100 UNIT/ML injection Inject 5-10 Units into the skin 2 (two) times daily. Take 10 units every morning and 5 units every evening       . lidocaine (XYLOCAINE) 5 % ointment Apply 1 application topically Twice daily.      Marland Kitchen morphine (MS CONTIN) 60 MG 12 hr tablet Take 60 mg by mouth 2 (two) times daily.         . nitroGLYCERIN (NITROSTAT) 0.4 MG SL tablet Place 0.4 mg under the tongue every 5 (five) minutes x 3 doses as needed. For chest pains      . omeprazole (PRILOSEC) 20 MG capsule Take 20 mg by mouth 2 (two) times daily.        . Pancrelipase, Lip-Prot-Amyl, (CREON PO) Take 2 capsules by mouth 3 (three) times daily.       . potassium chloride (K-DUR) 10 MEQ tablet Take 10 mEq by mouth 2 (two) times daily.        . pravastatin (PRAVACHOL) 80 MG tablet Take 80 mg by mouth daily.      . promethazine (PHENERGAN) 25 MG tablet Take 25 mg by mouth every 6 (six) hours as needed. For nausea      . sertraline (ZOLOFT) 100 MG tablet Take 50 mg by mouth daily.       Marland Kitchen torsemide (DEMADEX) 20 MG tablet Take 20 mg by mouth 2 (two) times daily.       Marland Kitchen diltiazem (TIAZAC) 180 MG 24 hr capsule Take 1 capsule (180 mg total) by mouth daily.  30 capsule  5  . rifaximin (XIFAXAN) 550 MG TABS Take 1 tablet (550 mg total) by mouth 2 (two) times daily.  28 tablet  0  . venlafaxine (EFFEXOR) 37.5 MG tablet Take 37.5 mg by mouth as directed. Take 1 tablet daily for 1 week, then take 1 tablet twice daily        ROS: No nausea or vomiting. No fever or chills.No melena or hematochezia.No bleeding.No claudication  Physical Exam: BP 110/72  Pulse 103  Ht 5' 7"  (1.702 m)  Wt 245 lb (111.131 kg)  BMI 38.37 kg/m2  SpO2 96% General:well-nourished white female in no distress. Neck:normal carotid upstrokes and no carotid bruits.  No thyromegaly no nodular thyroid. Lungs:clear breath sounds bilaterally without any wheezing Cardiac:regular rate and rhythm with normal S1 and S2 no murmurs rubs or gallops Vascular:no edema.  Normal distal pulses Skin:warm and dry Physcologic:normal affect  12lead ECG:not obtained Limited bedside ECHO:N/A No images are attached to the encounter.   Assessment and Plan  CAD (coronary artery disease) Patient has no recurrent chest pain.  She uses rarely nitroglycerin.  She is stable from a  cardiac perspective and does not require any ischemia testing at the present time  Chronic diastolic heart failure No evidence of volume overload and will continue with current medical regimen  Irritable bowel syndrome Patient was given a prescription with Rifaximine  550 mg by twice a day x14 days.  I also recommended hydrogen breath testing    Patient Active Problem List  Diagnoses  . IDDM  . DYSLIPIDEMIA  . OBSTRUCTIVE SLEEP APNEA  . Chronic diastolic heart failure  . TACHYCARDIA  . PALPITATIONS  . CHEST PAIN-UNSPECIFIED  . CAD (coronary artery disease)  . Diabetes mellitus  . Diabetic polyneuropathy  . Anxiety and depression  . Pancreatitis  . Irritable bowel syndrome

## 2011-08-24 NOTE — Assessment & Plan Note (Signed)
Patient has no recurrent chest pain.  She uses rarely nitroglycerin.  She is stable from a cardiac perspective and does not require any ischemia testing at the present time

## 2011-08-24 NOTE — Assessment & Plan Note (Signed)
Patient was given a prescription with Rifaximine 550 mg by twice a day x14 days.  I also recommended hydrogen breath testing

## 2011-08-24 NOTE — Assessment & Plan Note (Signed)
No evidence of volume overload and will continue with current medical regimen

## 2011-10-07 ENCOUNTER — Ambulatory Visit (INDEPENDENT_AMBULATORY_CARE_PROVIDER_SITE_OTHER): Payer: Medicaid Other | Admitting: Internal Medicine

## 2011-10-21 ENCOUNTER — Other Ambulatory Visit: Payer: Self-pay | Admitting: Cardiology

## 2012-01-09 ENCOUNTER — Other Ambulatory Visit: Payer: Self-pay | Admitting: Cardiology

## 2012-01-09 MED ORDER — NITROGLYCERIN 0.4 MG SL SUBL: 0.4000 mg | SUBLINGUAL_TABLET | SUBLINGUAL | Status: DC | PRN

## 2012-03-15 ENCOUNTER — Other Ambulatory Visit: Payer: Self-pay | Admitting: Cardiology

## 2012-03-15 MED ORDER — DILTIAZEM HCL ER BEADS 180 MG PO CP24: 180.0000 mg | ORAL_CAPSULE | Freq: Every day | ORAL | Status: DC

## 2012-03-15 MED ORDER — BENAZEPRIL HCL 20 MG PO TABS: 20.0000 mg | ORAL_TABLET | Freq: Every day | ORAL | Status: DC

## 2012-04-16 ENCOUNTER — Encounter: Payer: Self-pay | Admitting: Physical Medicine & Rehabilitation

## 2012-04-16 ENCOUNTER — Ambulatory Visit (HOSPITAL_BASED_OUTPATIENT_CLINIC_OR_DEPARTMENT_OTHER): Payer: Medicaid Other | Admitting: Physical Medicine & Rehabilitation

## 2012-04-16 ENCOUNTER — Encounter: Payer: Medicaid Other | Attending: Physical Medicine & Rehabilitation

## 2012-04-16 VITALS — BP 119/48 | HR 72 | Resp 14 | Ht 62.0 in | Wt 256.0 lb

## 2012-04-16 DIAGNOSIS — M47816 Spondylosis without myelopathy or radiculopathy, lumbar region: Secondary | ICD-10-CM

## 2012-04-16 DIAGNOSIS — M545 Low back pain, unspecified: Secondary | ICD-10-CM | POA: Insufficient documentation

## 2012-04-16 DIAGNOSIS — E1142 Type 2 diabetes mellitus with diabetic polyneuropathy: Secondary | ICD-10-CM

## 2012-04-16 DIAGNOSIS — M47817 Spondylosis without myelopathy or radiculopathy, lumbosacral region: Secondary | ICD-10-CM

## 2012-04-16 DIAGNOSIS — Z5181 Encounter for therapeutic drug level monitoring: Secondary | ICD-10-CM

## 2012-04-16 DIAGNOSIS — E1149 Type 2 diabetes mellitus with other diabetic neurological complication: Secondary | ICD-10-CM | POA: Insufficient documentation

## 2012-04-16 NOTE — Patient Instructions (Signed)
Next visit will be for lumbar medial branch blocks. This is to help with back pain We will check a urine drug screen this visit. If it looks okay I can start prescribing her morphine and we can try 60 mg 3 times a day and then discontinue the hydrocodone. We will not do this until I see you in 2 weeks Remain on your current medications prescribed by her family doctor until I see you next

## 2012-04-16 NOTE — Progress Notes (Signed)
Subjective:    Patient ID: [name], female    DOB: 1950-04-28, 62 y.o.   MRN: [id number]  HPI   A 62 year old female with long history of diabetes mellitus.   She has had the diagnosis of peripheral neuropathy for many years and   has been slowly progressing to the point where it has gotten to her   knees and now she feels numbness in her hands.  She has been on   Cymbalta.  She has been on gabapentin which only partially relieved her   pain.  She has also been on narcotic analgesics.  It has also partially   relieved her pain.  She has had also treatment for back pain and this is   her other main complaint.  She has been seen by Radiology and in fact   underwent caudal epidural injection May 02, 2004, which gave her   excellent relief with her lower extremity pain.  She then underwent   epidural at left L2-3, but did not get any followup in terms of how that   helped her.  She does not recall herself.  She has had no other   interventional pain procedures since that time.  She has had repeat MRI   of the lumbar spine in October 2011, demonstrating no significant   foraminal stenosis or no significant central stenosis.  She does have   some mild L5-S1 biforaminal stenosis which she also had at least as per   Radiology in 2006.  She also has moderate facet arthropathy L4-5, L5-S1   and mild at L3-4 Cervical MRI dated 01/17/2010 demonstrates moderate facet arthropathy left C4-5 And right C5-C6 otherwise unremarkable Right hip trochanteric bursa injection performed by Dr. Woody Seller 11/26/2011 was not helpful. He did not elevate her blood sugar readings  Vision problems or worsening. These are related to her diabetes.No longer is driving No falls however patient does complain of poor balance. Last HgB A1C ~7.18 Jan 2012 Pain Inventory Average Pain 8 Pain Right Now 10 My pain is constant and sharp  In the last 24 hours, has pain interfered with the following? General activity  10 Relation with others 7 Enjoyment of life 10 What TIME of day is your pain at its worst? all the time Sleep (in general) Poor  Pain is worse with: walking, bending, sitting, standing and some activites Pain improves with: heat/ice and medication Relief from Meds: 0  Mobility walk without assistance how many minutes can you walk? 1-2 ability to climb steps?  no do you drive?  no Do you have any goals in this area?  yes  Function disabled: date disabled 2004 I need assistance with the following:  dressing, bathing, meal prep, household duties and shopping  Neuro/Psych weakness numbness trouble walking dizziness confusion depression anxiety  Prior Studies Any changes since last visit?  no  Physicians involved in your care Any changes since last visit?  no   Family History  Problem Relation Age of Onset  . Cancer    . Coronary artery disease     History   Social History  . Marital Status: Legally Separated    Spouse Name: N/A    Number of Children: N/A  . Years of Education: N/A   Occupational History  . Disabled    Social History Main Topics  . Smoking status: Never Smoker   . Smokeless tobacco: Never Used  . Alcohol Use: No  . Drug Use: No  . Sexually Active: None  Other Topics Concern  . None   Social History Narrative   Patient does not get regular exercise   Past Surgical History  Procedure Date  . Hand surgery     right  . Coronary angioplasty with stent placement   . Colonoscopy 08/06/2011    Procedure: COLONOSCOPY;  Surgeon: Rogene Houston, MD;  Location: [facility];  Service: Endoscopy;  Laterality: N/A;  215   Past Medical History  Diagnosis Date  . Palpitations   . Tachycardia   . CAD (coronary artery disease)     Stent placement circumflex coronary 2007, catheterization 2008 patent stents. Normal LV function  . Chest pain   . Diabetes mellitus     Insulin dependent  . Dyslipidemia   . Anxiety and depression   .  Obstructive sleep apnea   . Diabetic polyneuropathy      severe on multiple medications  . 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 MI (myocardial infarction)     2007  . Anxiety   . Depression    BP 119/48  Pulse 72  Resp 14  Ht 5' 2"  (1.575 m)  Wt 256 lb (116.121 kg)  BMI 46.82 kg/m2  SpO2 91%    Review of Systems  Musculoskeletal: Positive for back pain and gait problem.  Neurological: Positive for dizziness, weakness and numbness.  Psychiatric/Behavioral: Positive for confusion and dysphoric mood. The patient is nervous/anxious.   All other systems reviewed and are negative.       Objective:   Physical Exam  Nursing note and vitals reviewed. Constitutional: She is oriented to person, place, and time. She appears well-developed.       Obese  HENT:  Head: Normocephalic and atraumatic.  Eyes: Conjunctivae normal and EOM are normal. Pupils are equal, round, and reactive to light.  Neurological: She is alert and oriented to person, place, and time.       Decreased light touch in bilateral feet Absent sensation to pinprick Up to the patellar tendon Bilateral No skin lesions.   Psychiatric: She has a normal mood and affect.          Assessment & Plan:  1. Painful diabetic neuropathy this is her primary problem. She is on maximal doses of gabapentin. She is on a combination between the long acting narcotic analgesic and a short acting. I would recommend switching to a long acting as the sole medication. Specifically would stop the hydrocodone and increased the MS Contin 60 mg from twice a day to 3 times a day. Discussed the patient agrees with plan. She will not do this until she comes back to see me. She has enough of her current medications until that time. We will check urine drug screen. 2. Lumbar pain with evidence of spondylosis on imaging studies. We will do lumbar medial branch blocks diagnostically//therapeutically next visit, further treatment based on result

## 2012-05-12 ENCOUNTER — Telehealth: Payer: Self-pay

## 2012-05-12 NOTE — Telephone Encounter (Signed)
Patient has an upper respiratory infection.  She is going to cancel injection and reschedule.

## 2012-05-13 ENCOUNTER — Ambulatory Visit: Payer: Medicaid Other | Admitting: Physical Medicine & Rehabilitation

## 2012-05-17 ENCOUNTER — Other Ambulatory Visit: Payer: Self-pay | Admitting: Physician Assistant

## 2012-06-10 ENCOUNTER — Encounter: Payer: Self-pay | Admitting: Physical Medicine & Rehabilitation

## 2012-06-10 ENCOUNTER — Encounter: Payer: Medicaid Other | Attending: Physical Medicine & Rehabilitation

## 2012-06-10 ENCOUNTER — Ambulatory Visit (HOSPITAL_BASED_OUTPATIENT_CLINIC_OR_DEPARTMENT_OTHER): Payer: Medicaid Other | Admitting: Physical Medicine & Rehabilitation

## 2012-06-10 VITALS — BP 120/67 | HR 102 | Resp 16 | Ht 62.0 in | Wt 263.4 lb

## 2012-06-10 DIAGNOSIS — M47817 Spondylosis without myelopathy or radiculopathy, lumbosacral region: Secondary | ICD-10-CM

## 2012-06-10 DIAGNOSIS — E1142 Type 2 diabetes mellitus with diabetic polyneuropathy: Secondary | ICD-10-CM | POA: Insufficient documentation

## 2012-06-10 DIAGNOSIS — E1149 Type 2 diabetes mellitus with other diabetic neurological complication: Secondary | ICD-10-CM | POA: Insufficient documentation

## 2012-06-10 DIAGNOSIS — M545 Low back pain, unspecified: Secondary | ICD-10-CM | POA: Insufficient documentation

## 2012-06-10 NOTE — Patient Instructions (Signed)
Facet Block A facet block is an injection procedure used to numb nerves near a spinal joint (facet). The injection usually includes a medicine like Novacaine (anesthetic) and a steroid medicine (similar to cortisone). The injections are made directly into the facet joint of the back. They are used for patients with several types of neck or back pain problems (such as worsening arthritis or persistent pain after surgery) that have not been helped with anti-inflammatory medications, exercise programs, physical therapy, and other forms of pain management. Multiple injections may be needed depending on how many joints are involved.  A facet block procedure can be helpful with diagnosis as well as providing therapeutic pain relief. One of three things may happen after the procedure:  The pain does not go away. This can mean that the pain is probably not coming from blocked facet joints. This information is helpful with diagnosis.  The pain goes away and stays away for a few hours but the original pain comes back and does not get better again. This information is also helpful with diagnosis. It can mean that pain is probably coming from the joints; but the steroid was not helpful for longer term pain control.  The pain goes away after the block, then returns later that day, and then gets better again over the next few days. This can mean that the block was helpful for pain control and the steroid had a longer lasting effect. If there is good, lasting benefit from the injections, the block may be repeated from 3 to 5 times. If there is good relief but it is only of short-term benefit, other procedures (such as radiofrequency lesioning) may be considered.  Note: The procedure cannot be performed if you have an active infection, a lesion on or near the area of injection, flu, cold, fever, very high blood pressure or if you are on blood thinners. Please make your doctor aware of any of these conditions. This is for  your safety!  LET YOUR CAREGIVER KNOW ABOUT:   Allergies.  Medications taken including herbs, eye drops, over the counter medications, and creams  Use of steroids (by mouth or creams).  Possible pregnancy, if applicable.  Previous problems with anesthetics or Novocaine.  History of blood clots.  History of bleeding or blood problems.  Previous surgery, particularly of the neck and/or back  Other health problems. RISKS AND COMPLICATIONS These are very uncommon but include:  Bleeding.  Injury to a nerve near the injection site.  Weakness or numbness in areas controlled by nerves near the injection site.  Infection.  Pain at the site of the injection.  Temporary fluid retention in those who are prone to this problem.  Allergic reaction to anesthetics or medicines used during the procedure. Diabetics may have a temporary increase in their blood sugar after any surgical procedure, especially if steroids are used. Stinging/burning of the numbing medicine is the most uncomfortable part of the procedure; however every person's response to any procedure is individual.  BEFORE THE PROCEDURE   Your caregiver will provide instructions about stopping any medication before the procedure.  Unless advised otherwise, if the injections are in your neck, you may take your medications as usual with a sip of water but do not eat or drink for 6 hours before the procedure.  Unless advised otherwise, you may eat, drink and take your medications as usual on the day of the procedure (both before and after) if the injections are to be in your lower back.  There is no other specific preparation necessary unless advised otherwise. PROCEDURE After checking your blood pressure, the procedure will be done in the x-ray (fluoroscopy) room while lying on your stomach. For procedures in the neck, an intravenous line is usually started. The back is then cleansed with an antiseptic soap. Sterile drapes are  placed in this area. The skin is numbed with a local anesthetic. This is felt as a stinging or burning sensation. Using x-ray guidance, needles are then advanced to the appropriate locations. Once the needles are in the proper location, the anesthetic and steroid is injected through the needles and the needles are removed. The skin is then cleansed and bandages are applied. Blood pressure will be checked again, and you will be discharged to leave with your ride after your caregiver says it is okay to go.  AFTER THE PROCEDURE  You may not drive for the remainder of the day after your procedure. An adult must be present to drive you home or to go with you in a taxi or on public transportation. The procedure will be canceled if you do not have a responsible adult with you! This is for your safety.  HOME CARE INSTRUCTIONS   The bandages noted above can be removed on the morning after the procedure.  Resume medications according to your caregiver's instructions.  No heat is to be used near or over the injected area(s) for the remainder of the day.  No tub bath or soaking in water (such as a pool, jacuzzi, etc.) for the remainder of the day.  Some local tenderness may be experienced for a couple of days after the injection. Using an ice pack three or four times a day will help this.  Keep track of the amount of pain relief as well as how long the pain relief lasted. SEEK MEDICAL CARE IF:   There is drainage from the injection site.  Pain is not controlled with medications prescribed.  There is significant bleeding or swelling. SEEK IMMEDIATE MEDICAL CARE IF:   You develop a fever of 101 F (38.3 C) or greater.  Worsening pain, swelling, and/or red streaking develops in the skin around the injection site.  Severe pain develops and cannot be controlled with medications prescribed.  You develop any headache, stiff neck, nausea, vomiting, or your eyes become very sensitive to light.  Weakness  or paralysis develops in arms or legs not present before the procedure.  You develop difficulty urinating or difficulty breathing. Document Released: 07/23/2006 Document Revised: 05/26/2011 Document Reviewed: 07/13/2008 [facility] Patient Information 2013 [place].

## 2012-06-10 NOTE — Progress Notes (Signed)
  PROCEDURE RECORD [facility]   Name: AZALYN SLIWA DOB:[birth year] MRN: [id number]  Date:06/10/2012  Physician: Claudette Laws, MD    Nurse/CMA: Leonor Liv EMT  Allergies:  Allergies  Allergen Reactions  . Nitrofuran Derivatives Itching and Swelling  . Amphetamine-Dextroamphetamine Swelling  . Pregabalin Swelling  . Topiramate Other (See Comments)    Tongue tingle  . Verelan (Verapamil) Rash    Consent Signed: yes  Is patient diabetic? yes  CBG today?160  Pregnant: no LMP: No LMP recorded. Patient has had a hysterectomy. (age 60-55)  Anticoagulants: no Anti-inflammatory: no Antibiotics: no  Procedure: bilateral Medial branch block  Position: Prone Start Time:11:30  End Time: 11:39  Fluoro Time: 32  RN/CMA Deztiny Sarra EMT Moriyah Byington EMT    Time 11:12 11:45    BP 120 67 139 62    Pulse 102 105    Respirations 16 16    O2 Sat 95 94    S/S 6 6    Pain Level 10 0     D/C home with Merrily Brittle, patient A & O X 3, D/C instructions reviewed, and sits independently.

## 2012-06-10 NOTE — Progress Notes (Signed)
Bilateral Lumbar L3, L4  medial branch blocks and L 5 dorsal ramus injection under fluoroscopic guidance  Indication: Lumbar pain which is not relieved by medication management or other conservative care and interfering with self-care and mobility.  Informed consent was obtained after describing risks and benefits of the procedure with the patient, this includes bleeding, infection, paralysis and medication side effects.  The patient wishes to proceed and has given written consent.  The patient was placed in prone position.  The lumbar area was marked and prepped with Betadine.  One mL of 1% lidocaine was injected into each of 6 areas into the skin and subcutaneous tissue.  Then a 22-gauge 5 inch spinal needle was inserted targeting the junction of the left S1 superior articular process and sacral ala junction. Needle was advanced under fluoroscopic guidance.  Bone contact was made.  Omnipaque 180 was injected x 0.5 mL demonstrating no intravascular uptake.  Then a solution containing one mL of 4 mg per mL dexamethasone and 3 mL of 2% MPF lidocaine was injected x 0.5 mL.  Then the left L5 superior articular process in transverse process junction was targeted.  Bone contact was made.  Omnipaque 180 was injected x 0.5 mL demonstrating no intravascular uptake. Then a solution containing one mL of 4 mg per mL dexamethasone and 3 mL of 2% MPF lidocaine was injected x 0.5 mL.  Then the left L4 superior articular process in transverse process junction was targeted.  Bone contact was made.  Omnipaque 180 was injected x 0.5 mL demonstrating no intravascular uptake.  Then a solution containing one mL of 4 mg per mL dexamethasone and 3 mL if 2% MPF lidocaine was injected x 0.5 mL.  This same procedure was performed on the right side using the same needle, technique and injectate.  Patient tolerated procedure well.  Post procedure instructions were given.

## 2012-06-11 ENCOUNTER — Telehealth: Payer: Self-pay

## 2012-06-11 NOTE — Telephone Encounter (Signed)
Patient called complaining of pain on her right side and going down her leg.  Advised patient to try alternating heat and ice and to follow instructions on discharge summary.  Also advised her that if they get worse to call and schedule appointment.

## 2012-06-24 ENCOUNTER — Ambulatory Visit: Payer: Medicaid Other | Admitting: Physical Medicine & Rehabilitation

## 2012-07-14 ENCOUNTER — Other Ambulatory Visit: Payer: Self-pay | Admitting: Physician Assistant

## 2012-07-15 ENCOUNTER — Other Ambulatory Visit: Payer: Self-pay | Admitting: *Deleted

## 2012-07-15 MED ORDER — BENAZEPRIL HCL 20 MG PO TABS: ORAL_TABLET | ORAL | Status: DC

## 2012-07-15 MED ORDER — DILTIAZEM HCL ER COATED BEADS 180 MG PO CP24: ORAL_CAPSULE | ORAL | Status: DC

## 2012-07-19 ENCOUNTER — Ambulatory Visit (HOSPITAL_BASED_OUTPATIENT_CLINIC_OR_DEPARTMENT_OTHER): Payer: Medicaid Other | Admitting: Physical Medicine & Rehabilitation

## 2012-07-19 ENCOUNTER — Encounter: Payer: Medicaid Other | Attending: Physical Medicine & Rehabilitation

## 2012-07-19 ENCOUNTER — Encounter: Payer: Self-pay | Admitting: Physical Medicine & Rehabilitation

## 2012-07-19 VITALS — BP 121/58 | HR 90 | Resp 14 | Ht 62.0 in | Wt 252.6 lb

## 2012-07-19 DIAGNOSIS — M48061 Spinal stenosis, lumbar region without neurogenic claudication: Secondary | ICD-10-CM | POA: Insufficient documentation

## 2012-07-19 DIAGNOSIS — E1142 Type 2 diabetes mellitus with diabetic polyneuropathy: Secondary | ICD-10-CM

## 2012-07-19 DIAGNOSIS — M47817 Spondylosis without myelopathy or radiculopathy, lumbosacral region: Secondary | ICD-10-CM | POA: Insufficient documentation

## 2012-07-19 DIAGNOSIS — M545 Low back pain, unspecified: Secondary | ICD-10-CM | POA: Insufficient documentation

## 2012-07-19 DIAGNOSIS — E1149 Type 2 diabetes mellitus with other diabetic neurological complication: Secondary | ICD-10-CM | POA: Insufficient documentation

## 2012-07-19 MED ORDER — MORPHINE SULFATE ER 60 MG PO TBCR: 60.0000 mg | EXTENDED_RELEASE_TABLET | Freq: Three times a day (TID) | ORAL | Status: DC

## 2012-07-19 MED ORDER — DIAZEPAM 10 MG PO TABS: 10.0000 mg | ORAL_TABLET | Freq: Once | ORAL | Status: DC

## 2012-07-19 NOTE — Progress Notes (Signed)
Subjective:    Patient ID: [name], female    DOB: Mar 05, 1951, 62 y.o.   MRN: [id number] A 63 year old female with long history of diabetes mellitus.   She has had the diagnosis of peripheral neuropathy for many years and   has been slowly progressing to the point where it has gotten to her   knees and now she feels numbness in her hands.  She has been on   Cymbalta.  She has been on gabapentin which only partially relieved her   pain.  She has also been on narcotic analgesics.  It has also partially   relieved her pain.  She has had also treatment for back pain and this is   her other main complaint.  She has been seen by Radiology and in fact   underwent caudal epidural injection May 02, 2004, which gave her   excellent relief with her lower extremity pain.  She then underwent   epidural at left L2-3, but did not get any followup in terms of how that   helped her.  She does not recall herself.  She has had no other   interventional pain procedures since that time.  She has had repeat MRI   of the lumbar spine in October 2011, demonstrating no significant   foraminal stenosis or no significant central stenosis.  She does have   some mild L5-S1 biforaminal stenosis which she also had at least as per   Radiology in 2006.  She also has moderate facet arthropathy L4-5, L5-S1   and mild at L3-4 Cervical MRI dated 01/17/2010 demonstrates moderate facet arthropathy left C4-5 And right C5-C6 otherwise unremarkable Right hip trochanteric bursa injection performed by Dr. Woody Seller 11/26/2011 was not helpful. He did not elevate her blood sugar readings HPI Since last visit the patient has had yeast infection, upper respirator infection, urinary infection, flulike illness and diarrhea. Treated with multiple antibiotics. Was treated as an outpatient. Underwent lumbar medial branch blocks 06/10/2012. She had a couple hours relief. Pain Inventory Average Pain 5 Pain Right Now 8 My pain is  burning and tingling  In the last 24 hours, has pain interfered with the following? General activity 7 Relation with others 7 Enjoyment of life 7 What TIME of day is your pain at its worst? night Sleep (in general) Poor  Pain is worse with: walking, bending, standing and some activites Pain improves with: heat/ice and medication Relief from Meds: 2  Mobility walk with assistance use a cane how many minutes can you walk? 10 ability to climb steps?  no do you drive?  no  Function I need assistance with the following:  meal prep, household duties and shopping  Neuro/Psych weakness numbness tingling trouble walking dizziness  Prior Studies Any changes since last visit?  no  Physicians involved in your care Any changes since last visit?  no   Family History  Problem Relation Age of Onset  . Cancer    . Coronary artery disease     History   Social History  . Marital Status: Legally Separated    Spouse Name: N/A    Number of Children: N/A  . Years of Education: N/A   Occupational History  . Disabled    Social History Main Topics  . Smoking status: Never Smoker   . Smokeless tobacco: Never Used  . Alcohol Use: No  . Drug Use: No  . Sexually Active: None   Other Topics Concern  . None   Social  History Narrative   Patient does not get regular exercise   Past Surgical History  Procedure Laterality Date  . Hand surgery      right  . Coronary angioplasty with stent placement    . Colonoscopy  08/06/2011    Procedure: COLONOSCOPY;  Surgeon: Rogene Houston, MD;  Location: [facility];  Service: Endoscopy;  Laterality: N/A;  215   Past Medical History  Diagnosis Date  . Palpitations   . Tachycardia   . CAD (coronary artery disease)     Stent placement circumflex coronary 2007, catheterization 2008 patent stents. Normal LV function  . Chest pain   . Diabetes mellitus     Insulin dependent  . Dyslipidemia   . Anxiety and depression   . Obstructive  sleep apnea   . Diabetic polyneuropathy      severe on multiple medications  . 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 MI (myocardial infarction)     2007  . Anxiety   . Depression    BP 121/58  Pulse 90  Resp 14  Ht 5' 2"  (1.575 m)  Wt 252 lb 9.6 oz (114.579 kg)  BMI 46.19 kg/m2  SpO2 92%     Review of Systems  Constitutional: Positive for unexpected weight change.  Respiratory: Positive for apnea, cough and shortness of breath.   Cardiovascular: Positive for leg swelling.  Gastrointestinal: Positive for nausea and diarrhea.  Musculoskeletal: Positive for back pain.  Neurological: Positive for dizziness, weakness and numbness.       Tingling  All other systems reviewed and are negative.       Objective:   Physical Exam  Lumbar range of motion reduced flexion extension lateral patient bending Negative straight leg raising test Negative foot atrophy Decreased ankle jerks bilateral Ambulates slowly with a cane wide based      Assessment & Plan:  1. Painful diabetic neuropathy this is her primary problem. She is on maximal doses of gabapentin. She is on a combination between the long acting narcotic analgesic and a short acting. I would recommend switching to a long acting as the sole medication. stop the hydrocodone and increased the MS Contin 60 mg from twice a day to 3 times a day. Discussed the patient agrees with plan.  urine drug screen ok 2. Lumbar pain with evidence of spondylosis on imaging studies. We will do repeat lumbar medial branch blocks diagnostically//therapeutically next visit, further treatment based on result

## 2012-07-19 NOTE — Patient Instructions (Signed)
Dr. Woody Seller will prescribe her gabapentin I discontinued the hydrocodone Take your morphine 3 times per day Repeat injections lumbar one month Physical therapy at [facility]

## 2012-07-29 ENCOUNTER — Ambulatory Visit (HOSPITAL_COMMUNITY)
Admission: RE | Admit: 2012-07-29 | Discharge: 2012-07-29 | Disposition: A | Discharge status: Home / Self Care | Payer: Medicaid Other | Source: Ambulatory Visit | Attending: Physical Medicine & Rehabilitation | Admitting: Physical Medicine & Rehabilitation

## 2012-07-29 DIAGNOSIS — M6281 Muscle weakness (generalized): Secondary | ICD-10-CM | POA: Insufficient documentation

## 2012-07-29 DIAGNOSIS — IMO0001 Reserved for inherently not codable concepts without codable children: Secondary | ICD-10-CM | POA: Insufficient documentation

## 2012-07-29 DIAGNOSIS — G8929 Other chronic pain: Secondary | ICD-10-CM | POA: Insufficient documentation

## 2012-07-29 DIAGNOSIS — M545 Low back pain, unspecified: Secondary | ICD-10-CM | POA: Insufficient documentation

## 2012-07-29 DIAGNOSIS — E119 Type 2 diabetes mellitus without complications: Secondary | ICD-10-CM | POA: Insufficient documentation

## 2012-07-29 NOTE — Evaluation (Signed)
Physical Therapy Evaluation/Medicaid  Patient Details  Name: [name] MRN: [id number] Date of Birth: 10/23/1950  Today's Date: 07/29/2012 Time: 0940-1020 PT Time Calculation (min): 40 min Charges: 1 eval             Visit#: 1 of 4  Re-eval:   Assessment Diagnosis: Spinal Stenosis  Next MD Visit: Dr. Letta Pate - 08/19/12  Authorization: Medicaid    Authorization Time Period:    Authorization Visit#: 1 of 4   Past Medical History:  Past Medical History  Diagnosis Date  . Palpitations   . Tachycardia   . CAD (coronary artery disease)     Stent placement circumflex coronary 2007, catheterization 2008 patent stents. Normal LV function  . Chest pain   . Diabetes mellitus     Insulin dependent  . Dyslipidemia   . Anxiety and depression   . Obstructive sleep apnea   . Diabetic polyneuropathy      severe on multiple medications  . 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 MI (myocardial infarction)     2007  . Anxiety   . Depression    Past Surgical History:  Past Surgical History  Procedure Laterality Date  . Hand surgery      right  . Coronary angioplasty with stent placement    . Colonoscopy  08/06/2011    Procedure: COLONOSCOPY;  Surgeon: Rogene Houston, MD;  [facility];  Service: Endoscopy;  Laterality: N/A;  215    Subjective Symptoms/Limitations Symptoms: Pt is referred to PT for spinal stenosis to her lumbar spine.  She has significant findings of radiculopathy to her her BLE and BUE.  She has increased pain to her lumbar region.  She had recieved therapy in the past many years ago and she has recieved massages which help temporarily.  Repetition: Decreases Symptoms (Flexion) Pain Assessment Currently in Pain?: Yes Pain Score:   8 Pain Location: Back Pain Type: Neuropathic pain;Chronic pain Pain Onset: More than a month ago Pain Frequency: Constant  Balance Screening Balance Screen Has the patient fallen in the past 6 months: No Has the patient had a  decrease in activity level because of a fear of falling? : Yes Is the patient reluctant to leave their home because of a fear of falling? : No  Prior Functioning  Home Living Lives With: Significant other Prior Function Vocation: Unemployed  Cognition/Observation Observation/Other Assessments Observations: diastasis recti, SI misalignment Other Assessments: pain with Lt hip PROM IR  Sensation/Coordination/Flexibility/Functional Tests Coordination Gross Motor Movements are Fluid and Coordinated: No Coordination and Movement Description: impaired to Transverse abdominus and multifidus   Assessment RLE Strength RLE Overall Strength Comments: 3+/5 for hips LLE Strength LLE Overall Strength Comments: 3+/5 for hips Lumbar AROM Lumbar Flexion: decreased 50%  Lumbar Extension: WNL - lumbar pain Palpation Palpation: pain and tenderness thorughout lumbar parapsinal and gluteal region with significant fascial restrictions and muscle spams.   Exercise/Treatments Stretches Active Hamstring Stretch: 1 rep;30 seconds (BLE) Single Knee to Chest Stretch: 1 rep;30 seconds (BLE reduced radiuclar symptoms) Supine Ab Set: 5 reps;Limitations AB Set Limitations: 10 sec  Prone  Other Prone Lumbar Exercises: NMR for multifidus: 2x10 sec   Physical Therapy Assessment and Plan PT Assessment and Plan Clinical Impression Statement: Pt is a 62 year old female referred to PT for spinal stenosis causing back pain and radiculopathy to LE with following impairments listed below.  Pt will benefit from skilled therapeutic intervention in order to improve on the following deficits: Pain;Improper  spinal/pelvic alignment;Improper body mechanics;Impaired perceived functional ability;Increased fascial restricitons;Increased muscle spasms;Decreased range of motion;Decreased strength;Decreased coordination Rehab Potential: Fair Clinical Impairments Affecting Rehab Potential: limitied insurance PT Frequency: Min  1X/week PT Duration:  (3 weeks) PT Treatment/Interventions: Functional mobility training;Therapeutic activities;Therapeutic exercise;Neuromuscular re-education;Patient/family education;Manual techniques;Modalities PT Plan: MET for SI, continue with core strengthening, LE flexibility, posture     Goals Home Exercise Program Pt will Perform Home Exercise Program: Independently PT Goal: Perform Home Exercise Program - Progress: Goal set today PT Short Term Goals Time to Complete Short Term Goals: 3 weeks PT Short Term Goal 1: Pt will demonstrate independent core coordination in order to sit with improved posture.  PT Short Term Goal 2: Pt will present with improved SI alignment for improved QOL.  PT Short Term Goal 3: Pt will report pain less than 4/10 for improved QOL.   Problem List Patient Active Problem List   Diagnosis Date Noted  . Lumbago 07/29/2012  . Muscle weakness (generalized) 07/29/2012  . Spinal stenosis, lumbar region, without neurogenic claudication 07/19/2012  . Irritable bowel syndrome 08/24/2011  . Pancreatitis 10/17/2010  . CAD (coronary artery disease)   . Diabetes mellitus   . Diabetic polyneuropathy   . Anxiety and depression   . IDDM 09/20/2009  . DYSLIPIDEMIA 09/20/2009  . Chronic diastolic heart failure 13/10/6576  . OBSTRUCTIVE SLEEP APNEA 09/04/2009  . TACHYCARDIA 11/08/2008  . PALPITATIONS 11/08/2008  . CHEST PAIN-UNSPECIFIED 11/08/2008    General Behavior During Therapy: [facility] for tasks assessed/performed Cognition: [facility] for tasks performed PT Plan of Care PT Home Exercise Plan: given PT Patient Instructions: importance of HEP, exercises, expectations, answered questions about diagnosis.  Consulted and Agree with Plan of Care: Patient  GP    Geoffery Lyons, MPT, ATC 07/29/2012, 12:32 PM  Physician Documentation Your signature is required to indicate approval of the treatment plan as stated above.  Please sign and either send electronically or  make a copy of this report for your files and return this physician signed original.   Please mark one 1.__approve of plan  2. ___approve of plan with the following conditions.   ______________________________                                                          _____________________ Physician Signature                                                                                                             Date   INITIAL EVALUATION  Physical Therapy     Patient Name: [name] Date Of Birth: Jun 13, 1950  Guardian Name: N/A Treatment ICD-9 Code: 7242  Address: [address] Date of Evaluation: 07/29/2012  [place], [place] [address] Requested Dates of Service: 07/29/2012 - 08/19/2012       Therapy History: No known therapy for this problem  Reason For Referral:  Recipient has an ongoing injury, disease or condition  Prior Level of Function: *Required Assistance with ADLs (OT/PT) or Audition, Communication, Voice and/or Swallowing Skills (ST/AUD)  Additional Medical History: Symptoms/Limitations Symptoms: Pt is referred to PT for spinal stenosis to her lumbar spine. She has significant findings of neuropathy in her BLE and BUE. She has increased pain to her lumbar region. She had recieved therapy in the past many years ago and she has recieved massages which help temporarily. Repetition: Decreases Symptoms (Flexion) Pain Assessment Currently in Pain?: Yes Pain Score: 8 Pain Location: Back Pain Type: Neuropathic pain;Chronic pain Pain Onset: More than a month ago Pain Frequency: Constant Other Assessments: pain with Lt hip PROM IR RLE Strength RLE Overall Strength Comments: 3+/5 for hips LLE Strength LLE Overall Strength Comments: 3+/5 for hips Lumbar AROM Lumbar Flexion: decreased 50% Lumbar Extension: WNL - lumbar pain Palpation Palpation: pain and tenderness thorughout lumbar parapsinal and gluteal region with significant fascial restrictions and muscle spams.   Prematurity: N/A  Severity Level: N/A        Treatment Goals:  1. Goal: Pt will Perform Home Exercise Program: Independently  Baseline: Given  Duration: 3 Week(s)  2. Goal: Pt will demonstrate independent core coordination in order to sit with improved posture.  Baseline: Coordination and Movement Description: impaired to Transverse abdominus and multifidus  Duration: 3 Week(s)  3. Goal: Pt will present with improved SI alignment for improved QOL.  Baseline: misalignment of sacroiliac joint  Duration: 3 Week(s)  Goal: Pt will report pain less than 4/10 for improved QOL.  Baseline: Pain 8/10  Duration: 3 Week(s)         Treatment Frequency/Duration:  1x/week for 3 weeks  Units per visit: N/A    Additional Information: Clinical Impression Statement: Pt is a 62 year old female referred to PT for spinal stenosis causing back pain and radiculopathy to LE with following impairments listed below. Pt will benefit from skilled therapeutic intervention in order to improve on the following deficits: Pain;Improper spinal/pelvic alignment;Improper body mechanics;Impaired perceived functional ability;Increased fascial restricitons;Increased muscle spasms;Decreased range of motion;Decreased strength;Decreased coordination Rehab Potential: Fair Clinical Impairments Affecting Rehab Potential: limitied insurance PT Frequency: Min 1X/week PT Duration: (3 weeks) PT Treatment/Interventions: Functional mobility training;Therapeutic activities;Therapeutic exercise;Neuromuscular re-education;Patient/family education;Manual techniques;Modalities   Geoffery Lyons, [place], [place] 07/29/12       Therapist Signature  Date Physician Signature  Date    Geoffery Lyons       Therapist Name  Physician Name   Refer to the Review Status page for current case status

## 2012-08-04 ENCOUNTER — Inpatient Hospital Stay (HOSPITAL_COMMUNITY): Admission: RE | Admit: 2012-08-04 | Payer: Medicaid Other | Source: Ambulatory Visit

## 2012-08-12 ENCOUNTER — Inpatient Hospital Stay (HOSPITAL_COMMUNITY): Admission: RE | Admit: 2012-08-12 | Payer: Medicaid Other | Source: Ambulatory Visit

## 2012-08-16 ENCOUNTER — Other Ambulatory Visit: Payer: Self-pay | Admitting: Physician Assistant

## 2012-08-18 ENCOUNTER — Telehealth: Payer: Self-pay

## 2012-08-18 NOTE — Telephone Encounter (Signed)
Patient called and says medicaid will not pay for morphine.  She would like a prior authorization done before her next refill so she will not have to pay out of pocket.

## 2012-08-19 ENCOUNTER — Ambulatory Visit (HOSPITAL_BASED_OUTPATIENT_CLINIC_OR_DEPARTMENT_OTHER): Payer: Medicaid Other | Admitting: Physical Medicine & Rehabilitation

## 2012-08-19 ENCOUNTER — Encounter: Payer: Medicaid Other | Attending: Physical Medicine & Rehabilitation

## 2012-08-19 ENCOUNTER — Telehealth: Payer: Self-pay

## 2012-08-19 ENCOUNTER — Encounter: Payer: Self-pay | Admitting: Physical Medicine & Rehabilitation

## 2012-08-19 ENCOUNTER — Inpatient Hospital Stay (HOSPITAL_COMMUNITY): Admission: RE | Admit: 2012-08-19 | Payer: Medicaid Other | Source: Ambulatory Visit | Admitting: Physical Therapy

## 2012-08-19 VITALS — BP 128/77 | HR 95 | Resp 16 | Ht 62.0 in | Wt 252.0 lb

## 2012-08-19 DIAGNOSIS — M545 Low back pain, unspecified: Secondary | ICD-10-CM | POA: Insufficient documentation

## 2012-08-19 DIAGNOSIS — M48061 Spinal stenosis, lumbar region without neurogenic claudication: Secondary | ICD-10-CM

## 2012-08-19 DIAGNOSIS — M47817 Spondylosis without myelopathy or radiculopathy, lumbosacral region: Secondary | ICD-10-CM

## 2012-08-19 DIAGNOSIS — E1142 Type 2 diabetes mellitus with diabetic polyneuropathy: Secondary | ICD-10-CM

## 2012-08-19 DIAGNOSIS — Z79899 Other long term (current) drug therapy: Secondary | ICD-10-CM

## 2012-08-19 DIAGNOSIS — E1149 Type 2 diabetes mellitus with other diabetic neurological complication: Secondary | ICD-10-CM | POA: Insufficient documentation

## 2012-08-19 DIAGNOSIS — Z5181 Encounter for therapeutic drug level monitoring: Secondary | ICD-10-CM

## 2012-08-19 MED ORDER — OXYMORPHONE HCL ER 20 MG PO TB12: 20.0000 mg | ORAL_TABLET | Freq: Two times a day (BID) | ORAL | Status: DC

## 2012-08-19 NOTE — Telephone Encounter (Signed)
Patient called needing prior authorization for opana.  She put her nephew on the phone who proceeded to raise his voice about the situation.  Advised him we would expedite this.  Spoke with medicaid and they needed clinical information to support the medication.  This was faxed in and approved #[id number].  Pharmacy aware.  Left message for patient.

## 2012-08-19 NOTE — Patient Instructions (Signed)
Discontinue morphine and hydrocodone Start Opana ER 20 mg twice a day Return to clinic one month we will discuss how the medial branch blocks did for you as well as the medication

## 2012-08-19 NOTE — Progress Notes (Signed)
  PROCEDURE RECORD [facility]   Name: [name] DOB:Apr 14, 1950 MRN: [id number]  Date:08/19/2012  Physician: Claudette Laws, MD    Nurse/CMA: Kelli Churn, CMA(AAMA)/Shumaker, RN  Allergies:  Allergies  Allergen Reactions  . Nitrofuran Derivatives Itching and Swelling  . Amphetamine-Dextroamphetamine Swelling  . Pregabalin Swelling  . Topiramate Other (See Comments)    Tongue tingle  . Verelan (Verapamil) Rash    Consent Signed: yes  Is patient diabetic? yes  CBG today? 128  Pregnant: no LMP: No LMP recorded. Patient has had a hysterectomy. (age 89-55)  Anticoagulants: no Anti-inflammatory: no Antibiotics: no  Procedure: bilateral medial branch block L 2-3-4  Position: Prone Start Time: 10:56  End Time: 11:09 Fluoro Time: 32 seconds   RN/CMA Linzie Boursiquot, CMA Shumaker, Rn    Time 1008 11:13    BP 128/77 142/46    Pulse 95 95    Respirations 16 16    O2 Sat 96 95    S/S 6 6    Pain Level 9/10 9/10     D/C home with Fiance-Joe, patient A & O X 3, D/C instructions reviewed, and sits independently.

## 2012-08-19 NOTE — Progress Notes (Signed)
Bilateral Lumbar L3, L4  medial branch blocks and L 5 dorsal ramus injection under fluoroscopic guidance  Indication: Lumbar pain which is not relieved by medication management or other conservative care and interfering with self-care and mobility.  Informed consent was obtained after describing risks and benefits of the procedure with the patient, this includes bleeding, infection, paralysis and medication side effects.  The patient wishes to proceed and has given written consent.  The patient was placed in prone position.  The lumbar area was marked and prepped with Betadine.  One mL of 1% lidocaine was injected into each of 6 areas into the skin and subcutaneous tissue.  Then a 22-gauge 5 inch spinal needle was inserted targeting the junction of the left S1 superior articular process and sacral ala junction. Needle was advanced under fluoroscopic guidance.  Bone contact was made.  Omnipaque 180 was injected x 0.5 mL demonstrating no intravascular uptake.  Then a solution containing one mL of 4 mg per mL dexamethasone and 3 mL of 2% MPF lidocaine was injected x 0.5 mL.  Then the left L5 superior articular process in transverse process junction was targeted.  Bone contact was made.  Omnipaque 180 was injected x 0.5 mL demonstrating no intravascular uptake. Then a solution containing one mL of 4 mg per mL dexamethasone and 3 mL of 2% MPF lidocaine was injected x 0.5 mL.  Then the left L4 superior articular process in transverse process junction was targeted.  Bone contact was made.  Omnipaque 180 was injected x 0.5 mL demonstrating no intravascular uptake.  Then a solution containing one mL of 4 mg per mL dexamethasone and 3 mL if 2% MPF lidocaine was injected x 0.5 mL.  This same procedure was performed on the right side using the same needle, technique and injectate.  Patient tolerated procedure well.  Post procedure instructions were given.

## 2012-08-19 NOTE — Telephone Encounter (Signed)
Has appt with Dr Read Drivers today.

## 2012-08-30 ENCOUNTER — Telehealth: Payer: Self-pay | Admitting: *Deleted

## 2012-08-30 NOTE — Telephone Encounter (Signed)
I can reexamine her in clinic or have her followup with Santiago Glad. We would need to do that before iimaging tests are ordered

## 2012-08-30 NOTE — Telephone Encounter (Signed)
Saw Dr Letta Pate on 08/19/12 for and injection.  Saturday night she had bad pain in her back and it went all the way down to her foot and she was numb.  Wants to know if she can see a neurologist.  Please call.

## 2012-08-30 NOTE — Telephone Encounter (Signed)
She would like an MRI to see what is going on. I told her she would not need a neurologist for that.

## 2012-09-02 NOTE — Telephone Encounter (Signed)
Called pt at 832 am 06.19.2014.Marland Kitchen Pt states she will not be able to come until 07.03.14 bc that is when she will receive her gas voucher.Marland Kitchen ad

## 2012-09-08 ENCOUNTER — Other Ambulatory Visit: Payer: Self-pay | Admitting: Physician Assistant

## 2012-09-16 ENCOUNTER — Encounter: Payer: Medicaid Other | Attending: Physical Medicine & Rehabilitation

## 2012-09-16 ENCOUNTER — Ambulatory Visit (HOSPITAL_BASED_OUTPATIENT_CLINIC_OR_DEPARTMENT_OTHER): Payer: Medicaid Other | Admitting: Physical Medicine & Rehabilitation

## 2012-09-16 ENCOUNTER — Encounter: Payer: Self-pay | Admitting: Physical Medicine & Rehabilitation

## 2012-09-16 VITALS — BP 141/70 | HR 88 | Resp 16 | Ht 62.0 in | Wt 255.8 lb

## 2012-09-16 DIAGNOSIS — M5416 Radiculopathy, lumbar region: Secondary | ICD-10-CM

## 2012-09-16 DIAGNOSIS — E1142 Type 2 diabetes mellitus with diabetic polyneuropathy: Secondary | ICD-10-CM | POA: Insufficient documentation

## 2012-09-16 DIAGNOSIS — M545 Low back pain, unspecified: Secondary | ICD-10-CM

## 2012-09-16 DIAGNOSIS — IMO0002 Reserved for concepts with insufficient information to code with codable children: Secondary | ICD-10-CM

## 2012-09-16 DIAGNOSIS — E1149 Type 2 diabetes mellitus with other diabetic neurological complication: Secondary | ICD-10-CM

## 2012-09-16 DIAGNOSIS — M48061 Spinal stenosis, lumbar region without neurogenic claudication: Secondary | ICD-10-CM

## 2012-09-16 DIAGNOSIS — M47817 Spondylosis without myelopathy or radiculopathy, lumbosacral region: Secondary | ICD-10-CM | POA: Insufficient documentation

## 2012-09-16 MED ORDER — METAXALONE 800 MG PO TABS: 800.0000 mg | ORAL_TABLET | Freq: Three times a day (TID) | ORAL | Status: DC

## 2012-09-16 MED ORDER — OXYMORPHONE HCL ER 20 MG PO TB12: 20.0000 mg | ORAL_TABLET | Freq: Two times a day (BID) | ORAL | Status: DC

## 2012-09-16 MED ORDER — METHOCARBAMOL 500 MG PO TABS: 500.0000 mg | ORAL_TABLET | Freq: Four times a day (QID) | ORAL | Status: DC

## 2012-09-16 MED ORDER — VENLAFAXINE HCL 37.5 MG PO TABS: 75.0000 mg | ORAL_TABLET | Freq: Two times a day (BID) | ORAL | Status: DC

## 2012-09-16 NOTE — Progress Notes (Signed)
Subjective:    Patient ID: [name], female    DOB: 15-Sep-1950, 62 y.o.   MRN: [id number]  HPI Aug 19, 2012 12:24 PM EDT  08/19/2012   Bilateral Lumbar L3, L4 medial branch blocks and L 5 dorsal ramus injection under fluoroscopic guidance  Patient reports this injection was not helpful for her. Patient complains that around 2 weeks after the injections she started getting some increasing pain in the low back as well as the right lower extremity. No falls or other trauma.  Poor activity level. Does not walk much. Increasing numbness in both feet as well as hands. History of severe diabetic neuropathy  No fevers , no weight loss, no history of malignancy, no new or worsening bowel or bladder dysfunction  Pain Inventory Average Pain 8 Pain Right Now 7 My pain is sharp, burning, stabbing, tingling and aching  In the last 24 hours, has pain interfered with the following? General activity 7 Relation with others 5 Enjoyment of life 8 What TIME of day is your pain at its worst? night Sleep (in general) Poor  Pain is worse with: walking, bending, standing and some activites Pain improves with: heat/ice and medication Relief from Meds: 4  Mobility use a cane how many minutes can you walk? 10  Function disabled: date disabled . I need assistance with the following:  meal prep, household duties and shopping  Neuro/Psych weakness tingling dizziness depression anxiety  Prior Studies Any changes since last visit?  no  Physicians involved in your care Any changes since last visit?  no   Family History  Problem Relation Age of Onset  . Cancer    . Coronary artery disease     History   Social History  . Marital Status: Legally Separated    Spouse Name: N/A    Number of Children: N/A  . Years of Education: N/A   Occupational History  . Disabled    Social History Main Topics  . Smoking status: Never Smoker   . Smokeless tobacco: Never Used  . Alcohol Use: No   . Drug Use: No  . Sexually Active: None   Other Topics Concern  . None   Social History Narrative   Patient does not get regular exercise   Past Surgical History  Procedure Laterality Date  . Hand surgery      right  . Coronary angioplasty with stent placement    . Colonoscopy  08/06/2011    Procedure: COLONOSCOPY;  Surgeon: Rogene Houston, MD;  Location: [facility];  Service: Endoscopy;  Laterality: N/A;  215   Past Medical History  Diagnosis Date  . Palpitations   . Tachycardia   . CAD (coronary artery disease)     Stent placement circumflex coronary 2007, catheterization 2008 patent stents. Normal LV function  . Chest pain   . Diabetes mellitus     Insulin dependent  . Dyslipidemia   . Anxiety and depression   . Obstructive sleep apnea   . Diabetic polyneuropathy      severe on multiple medications  . 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 MI (myocardial infarction)     2007  . Anxiety   . Depression    BP 141/70  Pulse 88  Resp 16  Ht 5' 2"  (1.575 m)  Wt 255 lb 12.8 oz (116.03 kg)  BMI 46.77 kg/m2  SpO2 94%     Review of Systems  Gastrointestinal: Positive for nausea, vomiting, abdominal pain and diarrhea.  Neurological: Positive for dizziness and weakness.       Tingling  Psychiatric/Behavioral: Positive for dysphoric mood. The patient is nervous/anxious.   All other systems reviewed and are negative.       Objective:   Physical Exam  Nursing note and vitals reviewed. Constitutional: She is oriented to person, place, and time. She appears well-developed.       Obese  HENT:  Head: Normocephalic and atraumatic.  Eyes: Conjunctivae normal and EOM are normal. Pupils are equal, round, and reactive to light.  Neurological: She is alert and oriented to person, place, and time.       Decreased light touch in bilateral feet Absent sensation to pinprick Up to the patellar tendon Bilateral No skin lesions. Lumbar spine has reduced range of motion in all  directions. No directional pain preference. Tenderness to palpation bilateral lumbar paraspinal muscles. Negative straight leg raising test     Assessment & Plan:  #1. Lumbar pain now with increasing radicular discomfort. Last MRI from 7 years ago showed no compressive lesions. Has a cardiac stents will inform radiology. See whether she needs to have a CT scan ordered or whether they can do the MRI  Followup PA 1 month review MRI If there is evidence of nerve root impingement corresponding to symptoms on the right side may benefit from epidural injection

## 2012-09-16 NOTE — Patient Instructions (Signed)
Please remain active in stay of bed as much as possible

## 2012-09-24 ENCOUNTER — Ambulatory Visit (HOSPITAL_COMMUNITY): Payer: Medicaid Other

## 2012-09-28 ENCOUNTER — Ambulatory Visit (HOSPITAL_COMMUNITY): Payer: Medicaid Other

## 2012-10-13 ENCOUNTER — Ambulatory Visit (HOSPITAL_COMMUNITY)
Admission: RE | Admit: 2012-10-13 | Discharge: 2012-10-13 | Disposition: A | Discharge status: Home / Self Care | Payer: Medicaid Other | Source: Ambulatory Visit | Attending: Physical Medicine & Rehabilitation | Admitting: Physical Medicine & Rehabilitation

## 2012-10-13 ENCOUNTER — Encounter (HOSPITAL_COMMUNITY): Payer: Self-pay

## 2012-10-13 DIAGNOSIS — M5416 Radiculopathy, lumbar region: Secondary | ICD-10-CM

## 2012-10-13 DIAGNOSIS — M47817 Spondylosis without myelopathy or radiculopathy, lumbosacral region: Secondary | ICD-10-CM | POA: Insufficient documentation

## 2012-10-13 DIAGNOSIS — M545 Low back pain, unspecified: Secondary | ICD-10-CM | POA: Insufficient documentation

## 2012-10-13 DIAGNOSIS — M79609 Pain in unspecified limb: Secondary | ICD-10-CM | POA: Insufficient documentation

## 2012-10-14 ENCOUNTER — Encounter: Payer: Medicaid Other | Admitting: Physical Medicine and Rehabilitation

## 2012-10-15 ENCOUNTER — Telehealth: Payer: Self-pay

## 2012-10-15 ENCOUNTER — Other Ambulatory Visit: Payer: Self-pay | Admitting: Cardiology

## 2012-10-15 MED ORDER — DILTIAZEM HCL ER COATED BEADS 180 MG PO TB24: 180.0000 mg | ORAL_TABLET | Freq: Every day | ORAL | Status: DC

## 2012-10-15 NOTE — Telephone Encounter (Signed)
Pharmacy called about effexor script.  They say patients PCP has been prescribing.  Advised them to discontinue our script since patient did not keep last appointment and she can follow up with PCP.

## 2012-10-18 ENCOUNTER — Encounter: Payer: Self-pay | Admitting: Physical Medicine and Rehabilitation

## 2012-10-18 ENCOUNTER — Encounter
Payer: Medicaid Other | Attending: Physical Medicine and Rehabilitation | Admitting: Physical Medicine and Rehabilitation

## 2012-10-18 VITALS — BP 128/68 | HR 87 | Resp 16 | Ht 62.0 in | Wt 265.0 lb

## 2012-10-18 DIAGNOSIS — E1149 Type 2 diabetes mellitus with other diabetic neurological complication: Secondary | ICD-10-CM | POA: Insufficient documentation

## 2012-10-18 DIAGNOSIS — M47817 Spondylosis without myelopathy or radiculopathy, lumbosacral region: Secondary | ICD-10-CM

## 2012-10-18 DIAGNOSIS — Z794 Long term (current) use of insulin: Secondary | ICD-10-CM | POA: Insufficient documentation

## 2012-10-18 DIAGNOSIS — M6250 Muscle wasting and atrophy, not elsewhere classified, unspecified site: Secondary | ICD-10-CM | POA: Insufficient documentation

## 2012-10-18 DIAGNOSIS — E1142 Type 2 diabetes mellitus with diabetic polyneuropathy: Secondary | ICD-10-CM | POA: Insufficient documentation

## 2012-10-18 DIAGNOSIS — M545 Low back pain, unspecified: Secondary | ICD-10-CM

## 2012-10-18 DIAGNOSIS — Z79899 Other long term (current) drug therapy: Secondary | ICD-10-CM | POA: Insufficient documentation

## 2012-10-18 DIAGNOSIS — M48061 Spinal stenosis, lumbar region without neurogenic claudication: Secondary | ICD-10-CM | POA: Insufficient documentation

## 2012-10-18 MED ORDER — MELOXICAM 7.5 MG PO TABS: 7.5000 mg | ORAL_TABLET | Freq: Every day | ORAL | Status: DC

## 2012-10-18 MED ORDER — OXYMORPHONE HCL ER 20 MG PO TB12: 20.0000 mg | ORAL_TABLET | Freq: Two times a day (BID) | ORAL | Status: DC

## 2012-10-18 NOTE — Progress Notes (Signed)
Subjective:    Patient ID: [name], female    DOB: Aug 17, 1950, 62 y.o.   MRN: [id number]  HPI The patient is a 62 year old female female, who presents with LBP . The patient complains about moderate to severe pain, which radiates into her right LE, in a non-radicular pattern.  Patient also complains about numbness and tingling in her entire right LE  . She describes the pain as aching and stabbing . Applying heat and/or ice, taking medications , changing positions alleviate the symptoms. Prolonged standing, lying straight aggrevates the symptoms. The patient grades her pain as a  7/10.  Pain Inventory Average Pain 8 Pain Right Now 7 My pain is sharp, burning, stabbing, tingling and aching  In the last 24 hours, has pain interfered with the following? General activity 7 Relation with others 5 Enjoyment of life 8 What TIME of day is your pain at its worst? night Sleep (in general) Poor  Pain is worse with: walking, bending, standing and some activites Pain improves with: heat/ice and medication Relief from Meds: 4  Mobility use a cane how many minutes can you walk? 10 do you drive?  yes Do you have any goals in this area?  yes  Function disabled: date disabled . I need assistance with the following:  meal prep, household duties and shopping  Neuro/Psych weakness tingling dizziness depression anxiety  Prior Studies Any changes since last visit?  no  Physicians involved in your care Any changes since last visit?  no   Family History  Problem Relation Age of Onset  . Cancer    . Coronary artery disease     History   Social History  . Marital Status: Legally Separated    Spouse Name: N/A    Number of Children: N/A  . Years of Education: N/A   Occupational History  . Disabled    Social History Main Topics  . Smoking status: Never Smoker   . Smokeless tobacco: Never Used  . Alcohol Use: No  . Drug Use: No  . Sexually Active: None   Other Topics  Concern  . None   Social History Narrative   Patient does not get regular exercise   Past Surgical History  Procedure Laterality Date  . Hand surgery      right  . Coronary angioplasty with stent placement    . Colonoscopy  08/06/2011    Procedure: COLONOSCOPY;  Surgeon: Malissa Hippo, MD;  Location: [facility];  Service: Endoscopy;  Laterality: N/A;  215   Past Medical History  Diagnosis Date  . Palpitations   . Tachycardia   . CAD (coronary artery disease)     Stent placement circumflex coronary 2007, catheterization 2008 patent stents. Normal LV function  . Chest pain   . Diabetes mellitus     Insulin dependent  . Dyslipidemia   . Anxiety and depression   . Obstructive sleep apnea   . Diabetic polyneuropathy      severe on multiple medications  . 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 MI (myocardial infarction)     2007  . Anxiety   . Depression    BP 128/68  Pulse 87  Resp 16  Ht 5\' 2"  (1.575 m)  Wt 265 lb (120.203 kg)  BMI 48.46 kg/m2  SpO2 93%     Review of Systems  Gastrointestinal: Positive for nausea, abdominal pain and diarrhea.  Neurological: Positive for dizziness and weakness.  Psychiatric/Behavioral: Positive for dysphoric mood.  The patient is nervous/anxious.   All other systems reviewed and are negative.       Objective:   Physical Exam  Constitutional: She is oriented to person, place, and time. She appears well-developed and well-nourished.  Morbidly obese, walks with a cane  HENT:  Head: Normocephalic.  Neck: Neck supple.  Musculoskeletal: She exhibits tenderness.  Neurological: She is alert and oriented to person, place, and time.  Skin: Skin is warm and dry.  Psychiatric: She has a normal mood and affect.    Symmetric normal motor tone is noted throughout. Normal muscle bulk. Muscle testing reveals 5/5 muscle strength of the upper extremity, and 5/5 of the lower extremity. Full range of motion in upper and lower extremities. ROM of spine  is restricted. Fine motor movements are normal in both hands. Sensory is decreased in lower legs bilateral.  DTR in the upper and lower extremity are present and symmetric 1+. No clonus is noted.  Patient arises from chair with difficulty. Wide based gait with normal arm swing bilateral , able to stand on heels and toes . Tandem walk is only possible with some support. No pronator drift. Rhomberg negative.        Assessment & Plan:  This is a 62 year old female  with 1.Lumbar spondylosis, recent MRI, 10/13/12, showed : Disc and facet degeneration at L4-5 with mild spinal stenosis,  unchanged  Mild degenerative change L5-S1 with mild foraminal encroachment  bilaterally. 2.DM, with diabetic neuropathy 3.Morbidly obese 4.Deconditioned Plan : Recommended aquatic exercising, which would be beneficial for all of her medical problems. Unfortunately the patient's insurance is medicaid, which does not pay for aquatic PT. Advised patient to ask her local [employer], whether they support patients who are disabled, to help with the member fees. Patient wants to do this. Showed patient some stretches and core exercises she can do at home.  Prescribed Mobic for exacerbations of her LBP/ facet degeneration. Explained her MRI in great detail, went over images with patient. Refilled her Opana, 20mg  bid Spent at least 25 min with patient face to face. Consider formal PT, if she can not [place] [facility] [place] [facility]. Patient does not want to have any other injection into her back at this point, after the last ones, did not help, but increased her symptoms. Follow up in 1 month

## 2012-10-18 NOTE — Patient Instructions (Signed)
Ask in the [employer] in your town, whether they support people who are disabled, so that you might not have to pay for a membership, or pay less.

## 2012-11-01 ENCOUNTER — Ambulatory Visit (INDEPENDENT_AMBULATORY_CARE_PROVIDER_SITE_OTHER): Payer: Medicaid Other | Admitting: Cardiovascular Disease

## 2012-11-01 VITALS — BP 100/68 | HR 80 | Ht 63.0 in | Wt 259.0 lb

## 2012-11-01 DIAGNOSIS — I2510 Atherosclerotic heart disease of native coronary artery without angina pectoris: Secondary | ICD-10-CM

## 2012-11-01 DIAGNOSIS — I10 Essential (primary) hypertension: Secondary | ICD-10-CM

## 2012-11-01 MED ORDER — METOPROLOL SUCCINATE ER 50 MG PO TB24: 50.0000 mg | ORAL_TABLET | Freq: Every day | ORAL | Status: DC

## 2012-11-01 NOTE — Patient Instructions (Signed)
Your physician recommends that you schedule a follow-up appointment in: 1 year with Dr. Purvis Sheffield. You should receive a letter in the mail in 10 months. If you do not receive this letter by June 2015 call our office to schedule this appointment.   Your physician has recommended you make the following change in your medication:  Stop : Diltiazem 180 mg Start : Toprol XL 50 mg once daily. (A prescription 30 days has been sent to your pharmacy).  Continue all other medications.

## 2012-11-01 NOTE — Progress Notes (Signed)
Patient ID: [name], female   DOB: Sep 17, 1950, 62 y.o.   MRN: [id number]    SUBJECTIVE: [name] has a h/o an MI in 2005/05/21 with PCI of the LCx. She also has IDDM, sleep apnea, and dyslipidemia. She had a repeat cardiac catheterization 2006/05/21 which showed mild nonobstructive disease of the circumflex and right coronary artery. Her ejection fraction was normal at 65%. She had been subsequently admitted to [facility] and ruled out for myocardial infarction. Echocardiogram at that time was essentially within normal limits.  She also has a history of anxiety and depression as well as a long-standing history of diabetes mellitus with severe diabetic peripheral neuropathy.   She's had a few episodes of chest pain, requiring up to 2 SL nitro, most recently a month ago. She denies syncope. She did have associated SOB at those times, but currently denies any symptoms. She checks her BP at home and it appears to be normally well controlled.  She has lumbar spinal stenosis and a lot of chronic pain related to it.  She had a son who passed away from [place] in 05-22-95.   PMH: reviewed and listed in Problem List in Electronic Records (and see below)  Past Medical History   Diagnosis  Date   .  Palpitations    .  Tachycardia    .  CAD (coronary artery disease)      Stent placement circumflex coronary 05/21/05, catheterization May 21, 2006 patent stents. Normal LV function   .  Chest pain    .  Diabetes mellitus      Insulin dependent   .  Dyslipidemia    .  Anxiety and depression    .  Obstructive sleep apnea    .  Diabetic polyneuropathy      severe on multiple medications   .  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  MI (myocardial infarction)      2005-05-21   .  Anxiety    .  Depression     Past Surgical History   Procedure  Date   .  Hand surgery      right   .  Coronary angioplasty with stent placement    .  Colonoscopy  08/06/2011     Procedure: COLONOSCOPY; Surgeon: Rogene Houston, MD; Location: [facility]; Service: Endoscopy; Laterality: N/A; 215   Allergies/SH/FHX : available in Electronic Records for review  Allergies   Allergen  Reactions   .  Amphetamine-Dextroamphetamine  Swelling   .  Pregabalin  Swelling   .  Topiramate  Other (See Comments)     Tongue tingle   .  Verelan (Verapamil)  Rash      BP 100/68  Pulse 80    PHYSICAL EXAM General: NAD Neck: No JVD, no thyromegaly or thyroid nodule.  Lungs: Clear to auscultation bilaterally with normal respiratory effort. CV: Nondisplaced PMI.  Heart regular S1/S2, no S3/S4, no murmur.  No peripheral edema.  No carotid bruit.  Normal pedal pulses.  Abdomen: Soft, nontender, no hepatosplenomegaly, no distention.  Neurologic: Alert and oriented x 3.  Psych: Normal affect. Extremities: No clubbing or cyanosis.   ECG: NSR, 84 bpm  LABS: Basic Metabolic Panel: No results found for this basename: NA, K, CL, CO2, GLUCOSE, BUN, CREATININE, CALCIUM, MG, PHOS,  in the last 72 hours Liver Function Tests: No results found for this basename: AST, ALT, ALKPHOS, BILITOT, PROT, ALBUMIN,  in the last 72 hours No results found for this  basename: LIPASE, AMYLASE,  in the last 72 hours CBC: No results found for this basename: WBC, NEUTROABS, HGB, HCT, MCV, PLT,  in the last 72 hours Cardiac Enzymes: No results found for this basename: CKTOTAL, CKMB, CKMBINDEX, TROPONINI,  in the last 72 hours BNP: No components found with this basename: POCBNP,  D-Dimer: No results found for this basename: DDIMER,  in the last 72 hours Hemoglobin A1C: No results found for this basename: HGBA1C,  in the last 72 hours Fasting Lipid Panel: No results found for this basename: CHOL, HDL, LDLCALC, TRIG, CHOLHDL, LDLDIRECT,  in the last 72 hours Thyroid Function Tests: No results found for this basename: TSH, T4TOTAL, FREET3, T3FREE, THYROIDAB,  in the last 72 hours Anemia Panel: No results found for this basename: VITAMINB12, FOLATE, FERRITIN, TIBC, IRON,  RETICCTPCT,  in the last 72 hours  RADIOLOGY: Mr Lumbar Spine Wo Contrast  10/13/2012   *RADIOLOGY REPORT*  Clinical Data: Back pain with right leg pain  MRI LUMBAR SPINE WITHOUT CONTRAST  Technique:  Multiplanar and multiecho pulse sequences of the lumbar spine were obtained without intravenous contrast.  Comparison: Lumbar MRI 03/03/2012  Findings: Normal lumbar alignment.  Negative for fracture or mass lesion.  Hemangioma L1 vertebral body unchanged.  Conus medullaris normal and terminates at mid L2.  L1-2:  Negative  L2-3:  Negative  L3-4:  Normal disc.  Mild facet degeneration  L4-5:  Mild disc bulging.  Moderate to advanced facet degeneration with bilateral facet joint effusions and facet hypertrophy.  Mild spinal stenosis, unchanged.  Neural foramina are adequately patent  L5-S1:  Mild disc and mild facet degeneration.  Negative for neural impingement.  Mild foraminal narrowing bilaterally.  IMPRESSION: Disc and facet degeneration at L4-5 with mild spinal stenosis, unchanged  Mild degenerative change L5-S1 with mild foraminal encroachment bilaterally.   Original Report Authenticated By: Carl Best, M.D.      ASSESSMENT AND PLAN: 1. CAD: she is currently asymptomatic, and remains on ASA and Lipitor. She is on Diltiazem (for tachy-palps) but feels this isn't helping any longer. I'll switch her to Toprol XL 50 mg daily, as this should benefit her with regards to its anti-ischemic properties as well. 2. HTN: well controlled on Benazepril. She monitors it at home.  Kate Sable, M.D., F.A.C.C.

## 2012-11-17 ENCOUNTER — Encounter
Payer: Medicaid Other | Attending: Physical Medicine and Rehabilitation | Admitting: Physical Medicine and Rehabilitation

## 2012-11-17 ENCOUNTER — Encounter: Payer: Self-pay | Admitting: Physical Medicine and Rehabilitation

## 2012-11-17 ENCOUNTER — Other Ambulatory Visit: Payer: Self-pay | Admitting: Cardiology

## 2012-11-17 VITALS — BP 119/63 | HR 94 | Resp 14 | Ht 62.0 in | Wt 263.4 lb

## 2012-11-17 DIAGNOSIS — E785 Hyperlipidemia, unspecified: Secondary | ICD-10-CM | POA: Insufficient documentation

## 2012-11-17 DIAGNOSIS — E1149 Type 2 diabetes mellitus with other diabetic neurological complication: Secondary | ICD-10-CM

## 2012-11-17 DIAGNOSIS — K869 Disease of pancreas, unspecified: Secondary | ICD-10-CM | POA: Insufficient documentation

## 2012-11-17 DIAGNOSIS — I2510 Atherosclerotic heart disease of native coronary artery without angina pectoris: Secondary | ICD-10-CM | POA: Insufficient documentation

## 2012-11-17 DIAGNOSIS — M51379 Other intervertebral disc degeneration, lumbosacral region without mention of lumbar back pain or lower extremity pain: Secondary | ICD-10-CM | POA: Insufficient documentation

## 2012-11-17 DIAGNOSIS — M545 Low back pain, unspecified: Secondary | ICD-10-CM | POA: Insufficient documentation

## 2012-11-17 DIAGNOSIS — M5137 Other intervertebral disc degeneration, lumbosacral region: Secondary | ICD-10-CM | POA: Insufficient documentation

## 2012-11-17 DIAGNOSIS — M47817 Spondylosis without myelopathy or radiculopathy, lumbosacral region: Secondary | ICD-10-CM | POA: Insufficient documentation

## 2012-11-17 DIAGNOSIS — G4733 Obstructive sleep apnea (adult) (pediatric): Secondary | ICD-10-CM | POA: Insufficient documentation

## 2012-11-17 DIAGNOSIS — E1140 Type 2 diabetes mellitus with diabetic neuropathy, unspecified: Secondary | ICD-10-CM

## 2012-11-17 DIAGNOSIS — I252 Old myocardial infarction: Secondary | ICD-10-CM | POA: Insufficient documentation

## 2012-11-17 DIAGNOSIS — E1142 Type 2 diabetes mellitus with diabetic polyneuropathy: Secondary | ICD-10-CM | POA: Insufficient documentation

## 2012-11-17 MED ORDER — OXYMORPHONE HCL ER 20 MG PO TB12: 20.0000 mg | ORAL_TABLET | Freq: Two times a day (BID) | ORAL | Status: DC

## 2012-11-17 MED ORDER — MELOXICAM 15 MG PO TABS: 15.0000 mg | ORAL_TABLET | Freq: Every day | ORAL | Status: DC

## 2012-11-17 MED ORDER — BENAZEPRIL HCL 20 MG PO TABS: 20.0000 mg | ORAL_TABLET | Freq: Every day | ORAL | Status: DC

## 2012-11-17 MED ORDER — OXYMORPHONE HCL ER 40 MG PO TB12: 40.0000 mg | ORAL_TABLET | Freq: Two times a day (BID) | ORAL | Status: DC

## 2012-11-17 MED ORDER — METHOCARBAMOL 500 MG PO TABS: 500.0000 mg | ORAL_TABLET | Freq: Four times a day (QID) | ORAL | Status: DC

## 2012-11-17 NOTE — Patient Instructions (Addendum)
Try to get into aquatic exercising. Follow up with your PCP for your pancreatic pain.

## 2012-11-17 NOTE — Progress Notes (Signed)
Subjective:    Patient ID: [name], female    DOB: June 07, 1950, 62 y.o.   MRN: [id number]  HPI The patient is a 62 year old female female, who presents with LBP . The patient complains about moderate to severe pain, which radiates into her right LE, in a non-radicular pattern. Patient also complains about numbness and tingling in her entire right LE . She describes the pain as aching and stabbing . Applying heat and/or ice, taking medications , changing positions alleviate the symptoms. Prolonged standing, lying straight aggrevates the symptoms. The patient grades her pain as a 9/10.She states that she is in more pain today, because her pancreatic pain has flared up, she will see her PCP tomorrow for this pain.  Pain Inventory Average Pain 9 Pain Right Now 9 My pain is constant, sharp, burning, stabbing, tingling and aching  In the last 24 hours, has pain interfered with the following? General activity 8 Relation with others 6 Enjoyment of life 9 What TIME of day is your pain at its worst? morning and night Sleep (in general) Poor  Pain is worse with: walking, bending, standing and some activites Pain improves with: heat/ice and medication Relief from Meds: 2  Mobility use a cane use a walker how many minutes can you walk? 5  Function I need assistance with the following:  bathing, meal prep, household duties and shopping  Neuro/Psych bladder control problems weakness tingling trouble walking dizziness depression anxiety  Prior Studies Any changes since last visit?  yes  Was in hospital  Physicians involved in your care Any changes since last visit?  no   Family History  Problem Relation Age of Onset  . Cancer    . Coronary artery disease     History   Social History  . Marital Status: Legally Separated    Spouse Name: N/A    Number of Children: N/A  . Years of Education: N/A   Occupational History  . Disabled    Social History Main Topics  .  Smoking status: Never Smoker   . Smokeless tobacco: Never Used  . Alcohol Use: No  . Drug Use: No  . Sexual Activity: None   Other Topics Concern  . None   Social History Narrative   Patient does not get regular exercise   Past Surgical History  Procedure Laterality Date  . Hand surgery      right  . Coronary angioplasty with stent placement    . Colonoscopy  08/06/2011    Procedure: COLONOSCOPY;  Surgeon: Malissa Hippo, MD;  Location: [facility];  Service: Endoscopy;  Laterality: N/A;  215   Past Medical History  Diagnosis Date  . Palpitations   . Tachycardia   . CAD (coronary artery disease)     Stent placement circumflex coronary 2007, catheterization 2008 patent stents. Normal LV function  . Chest pain   . Diabetes mellitus     Insulin dependent  . Dyslipidemia   . Anxiety and depression   . Obstructive sleep apnea   . Diabetic polyneuropathy      severe on multiple medications  . 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 MI (myocardial infarction)     2007  . Anxiety   . Depression    BP 119/63  Pulse 94  Resp 14  Ht 5\' 2"  (1.575 m)  Wt 263 lb 6.4 oz (119.477 kg)  BMI 48.16 kg/m2  SpO2 94%    Review of Systems  Gastrointestinal: Positive  for nausea, abdominal pain and diarrhea.  Genitourinary:       Bladder control problems  Musculoskeletal: Positive for back pain and gait problem.  Neurological: Positive for weakness.       Tingling  Psychiatric/Behavioral: Positive for dysphoric mood. The patient is nervous/anxious.   All other systems reviewed and are negative.       Objective:   Physical Exam  Constitutional: She is oriented to person, place, and time. She appears well-developed and well-nourished.  Morbidly obese, walks with a cane  HENT:  Head: Normocephalic.  Neck: Neck supple.  Musculoskeletal: She exhibits tenderness.  Neurological: She is alert and oriented to person, place, and time.  Skin: Skin is warm and dry.  Psychiatric: She has a  normal mood and affect.  Symmetric normal motor tone is noted throughout. Normal muscle bulk. Muscle testing reveals 5/5 muscle strength of the upper extremity, and 5/5 of the lower extremity. Full range of motion in upper and lower extremities. ROM of spine is restricted. Fine motor movements are normal in both hands.  Sensory is decreased in lower legs bilateral.  DTR in the upper and lower extremity are present and symmetric 1+. No clonus is noted.  Patient arises from chair with difficulty. Wide based gait with normal arm swing bilateral , able to stand on heels and toes . Tandem walk is only possible with some support. No pronator drift. Rhomberg negative.       Assessment & Plan:  This is a 63 year old female with  1.Lumbar spondylosis, recent MRI, 10/13/12, showed :  Disc and facet degeneration at L4-5 with mild spinal stenosis,  unchanged  Mild degenerative change L5-S1 with mild foraminal encroachment  bilaterally.  2.DM, with diabetic neuropathy  3.Morbidly obese  4.Deconditioned  5. Pancreatic pain, will follow up with PCP tomorrow Plan :  Recommended aquatic exercising, which would be beneficial for all of her medical problems. Unfortunately the patient's insurance is medicaid, which does not pay for aquatic PT. Advised patient to ask her local [employer], whether they support patients who are disabled, to help with the member fees.  Patient did this, but she can not afford the membership.  Showed patient some stretches and core exercises she can do at home.  Prescribed Mobic for exacerbations of her LBP/ facet degeneration.   During the patient's visit, I wanted to go into her med chart to order her medication, at that time somebody from [facility] was in the chart. When I went back into the chart, it stated that the patient is taking Opana 40mg  bid. The patient was asking for an increase of her narcotics at this visit, I explained to her that she is already on a high dose of  narcotics, and that she also has sleep apnea, which increases the risks of adverse effects of a high dose of narcotics even more.I explained to her that we would try to treat her pain with other options before considering an increase of her narcotics. Prescribed Robaxin and meloxicam, to further relief her low back pain. Refilled her Opana, I hit the reorder button.  When I documented the visit, I looked back at her old note, and saw that she was prescribed Opana 20mg  bid before. I looked into her Hx and 20mg  bid was always prescribed. We called the pharmacy to not fill her Rx for Opana 40mg  bid, and exchanged the patients prescription to the Opana 20mg  bid, which then was filled by the pharmacy. Somehow the  dosage was changed in the computer, I only hit the refill button I did not change the dosage. The patient got the correct dose, because I picked up on the mistake. The dosage was somehow changed in the med chart, not by [place].  Consider formal PT, when pancreatic pain has resolved.  Patient does not want to have any other injection into her back at this point, after the last ones, did not help, but increased her symptoms.  Follow up in 1 month

## 2012-12-01 ENCOUNTER — Telehealth (INDEPENDENT_AMBULATORY_CARE_PROVIDER_SITE_OTHER): Payer: Self-pay | Admitting: *Deleted

## 2012-12-01 NOTE — Telephone Encounter (Signed)
Would like to speak with Dr. Karilyn Cota. She knows her apt is not until 12/17/12 but she is not getting any satisfaction from her PCP. Offered her an apt with Dorene Ar, NP and patient refused. The return phone number [phone].

## 2012-12-02 ENCOUNTER — Emergency Department (HOSPITAL_COMMUNITY)
Admission: EM | Admit: 2012-12-02 | Discharge: 2012-12-02 | Disposition: A | Discharge status: Home / Self Care | Payer: Medicaid Other | Attending: Emergency Medicine | Admitting: Emergency Medicine

## 2012-12-02 ENCOUNTER — Encounter (HOSPITAL_COMMUNITY): Payer: Self-pay

## 2012-12-02 ENCOUNTER — Emergency Department (HOSPITAL_COMMUNITY): Payer: Medicaid Other

## 2012-12-02 DIAGNOSIS — R109 Unspecified abdominal pain: Secondary | ICD-10-CM

## 2012-12-02 DIAGNOSIS — Z7982 Long term (current) use of aspirin: Secondary | ICD-10-CM | POA: Insufficient documentation

## 2012-12-02 DIAGNOSIS — E1142 Type 2 diabetes mellitus with diabetic polyneuropathy: Secondary | ICD-10-CM | POA: Insufficient documentation

## 2012-12-02 DIAGNOSIS — F411 Generalized anxiety disorder: Secondary | ICD-10-CM | POA: Insufficient documentation

## 2012-12-02 DIAGNOSIS — I252 Old myocardial infarction: Secondary | ICD-10-CM | POA: Insufficient documentation

## 2012-12-02 DIAGNOSIS — R110 Nausea: Secondary | ICD-10-CM | POA: Insufficient documentation

## 2012-12-02 DIAGNOSIS — Z791 Long term (current) use of non-steroidal anti-inflammatories (NSAID): Secondary | ICD-10-CM | POA: Insufficient documentation

## 2012-12-02 DIAGNOSIS — F329 Major depressive disorder, single episode, unspecified: Secondary | ICD-10-CM | POA: Insufficient documentation

## 2012-12-02 DIAGNOSIS — Z794 Long term (current) use of insulin: Secondary | ICD-10-CM | POA: Insufficient documentation

## 2012-12-02 DIAGNOSIS — I2510 Atherosclerotic heart disease of native coronary artery without angina pectoris: Secondary | ICD-10-CM | POA: Insufficient documentation

## 2012-12-02 DIAGNOSIS — E1149 Type 2 diabetes mellitus with other diabetic neurological complication: Secondary | ICD-10-CM | POA: Insufficient documentation

## 2012-12-02 DIAGNOSIS — Z79899 Other long term (current) drug therapy: Secondary | ICD-10-CM | POA: Insufficient documentation

## 2012-12-02 DIAGNOSIS — E785 Hyperlipidemia, unspecified: Secondary | ICD-10-CM | POA: Insufficient documentation

## 2012-12-02 DIAGNOSIS — F3289 Other specified depressive episodes: Secondary | ICD-10-CM | POA: Insufficient documentation

## 2012-12-02 DIAGNOSIS — R1084 Generalized abdominal pain: Secondary | ICD-10-CM | POA: Insufficient documentation

## 2012-12-02 DIAGNOSIS — Z9861 Coronary angioplasty status: Secondary | ICD-10-CM | POA: Insufficient documentation

## 2012-12-02 LAB — CBC WITH DIFFERENTIAL/PLATELET
Basophils Absolute: 0 10*3/uL (ref 0.0–0.1)
Basophils Relative: 0 % (ref 0–1)
Eosinophils Absolute: 0.1 10*3/uL (ref 0.0–0.7)
Eosinophils Relative: 1 % (ref 0–5)
HCT: 45 % (ref 36.0–46.0)
Hemoglobin: 14.7 g/dL (ref 12.0–15.0)
Lymphocytes Relative: 33 % (ref 12–46)
Lymphs Abs: 3.4 10*3/uL (ref 0.7–4.0)
MCH: 27.2 pg (ref 26.0–34.0)
MCHC: 32.7 g/dL (ref 30.0–36.0)
MCV: 83.3 fL (ref 78.0–100.0)
Monocytes Absolute: 0.7 10*3/uL (ref 0.1–1.0)
Monocytes Relative: 7 % (ref 3–12)
Neutro Abs: 6.1 10*3/uL (ref 1.7–7.7)
Neutrophils Relative %: 59 % (ref 43–77)
Platelets: 377 10*3/uL (ref 150–400)
RBC: 5.4 MIL/uL — ABNORMAL HIGH (ref 3.87–5.11)
RDW: 14.1 % (ref 11.5–15.5)
WBC: 10.4 10*3/uL (ref 4.0–10.5)

## 2012-12-02 LAB — COMPREHENSIVE METABOLIC PANEL
ALT: 9 U/L (ref 0–35)
AST: 12 U/L (ref 0–37)
Albumin: 3.4 g/dL — ABNORMAL LOW (ref 3.5–5.2)
Alkaline Phosphatase: 117 U/L (ref 39–117)
BUN: 32 mg/dL — ABNORMAL HIGH (ref 6–23)
CO2: 38 mEq/L — ABNORMAL HIGH (ref 19–32)
Calcium: 10.8 mg/dL — ABNORMAL HIGH (ref 8.4–10.5)
Chloride: 87 mEq/L — ABNORMAL LOW (ref 96–112)
Creatinine, Ser: 1.05 mg/dL (ref 0.50–1.10)
GFR calc Af Amer: 66 mL/min — ABNORMAL LOW (ref >90)
GFR calc non Af Amer: 57 mL/min — ABNORMAL LOW (ref >90)
Glucose, Bld: 216 mg/dL — ABNORMAL HIGH (ref 70–99)
Potassium: 3.3 mEq/L — ABNORMAL LOW (ref 3.5–5.1)
Sodium: 137 mEq/L (ref 135–145)
Total Bilirubin: 0.4 mg/dL (ref 0.3–1.2)
Total Protein: 7.9 g/dL (ref 6.0–8.3)

## 2012-12-02 LAB — URINALYSIS, ROUTINE W REFLEX MICROSCOPIC
Bilirubin Urine: NEGATIVE
Glucose, UA: NEGATIVE mg/dL
Ketones, ur: NEGATIVE mg/dL
Leukocytes, UA: NEGATIVE
Nitrite: NEGATIVE
Protein, ur: NEGATIVE mg/dL
Specific Gravity, Urine: 1.02 (ref 1.005–1.030)
Urobilinogen, UA: 0.2 mg/dL (ref 0.0–1.0)
pH: 5.5 (ref 5.0–8.0)

## 2012-12-02 LAB — URINE MICROSCOPIC-ADD ON

## 2012-12-02 LAB — LIPASE, BLOOD: Lipase: 18 U/L (ref 11–59)

## 2012-12-02 MED ORDER — IOHEXOL 300 MG/ML  SOLN
100.0000 mL | Freq: Once | INTRAMUSCULAR | Status: AC | PRN
  Administered 2012-12-02: 100 mL via INTRAVENOUS

## 2012-12-02 MED ORDER — SODIUM CHLORIDE 0.9 % IV SOLN
1000.0000 mL | Freq: Once | INTRAVENOUS | Status: AC
  Administered 2012-12-02: 1000 mL via INTRAVENOUS

## 2012-12-02 MED ORDER — IOHEXOL 300 MG/ML  SOLN
50.0000 mL | Freq: Once | INTRAMUSCULAR | Status: AC | PRN
  Administered 2012-12-02: 50 mL via ORAL

## 2012-12-02 MED ORDER — ONDANSETRON HCL 4 MG/2ML IJ SOLN: 4.0000 mg | Freq: Once | INTRAMUSCULAR | Status: DC

## 2012-12-02 MED ORDER — SODIUM CHLORIDE 0.9 % IV SOLN
Freq: Once | INTRAVENOUS | Status: AC
  Administered 2012-12-02: 16:00:00 via INTRAVENOUS

## 2012-12-02 MED ORDER — ONDANSETRON HCL 4 MG PO TABS: 4.0000 mg | ORAL_TABLET | Freq: Four times a day (QID) | ORAL | Status: DC

## 2012-12-02 MED ORDER — AMOXICILLIN-POT CLAVULANATE 875-125 MG PO TABS: 1.0000 | ORAL_TABLET | Freq: Two times a day (BID) | ORAL | Status: DC

## 2012-12-02 MED ORDER — ONDANSETRON HCL 4 MG/2ML IJ SOLN
4.0000 mg | Freq: Once | INTRAMUSCULAR | Status: AC
  Administered 2012-12-02: 4 mg via INTRAVENOUS
  Filled 2012-12-02: qty 2

## 2012-12-02 MED ORDER — ONDANSETRON HCL 4 MG/2ML IJ SOLN
INTRAMUSCULAR | Status: AC
  Administered 2012-12-02: 4 mg via INTRAVENOUS
  Filled 2012-12-02: qty 2

## 2012-12-02 MED ORDER — ONDANSETRON HCL 4 MG/2ML IJ SOLN
4.0000 mg | Freq: Once | INTRAMUSCULAR | Status: AC
  Administered 2012-12-02: 4 mg via INTRAVENOUS

## 2012-12-02 MED ORDER — ALPRAZOLAM 0.5 MG PO TABS
1.0000 mg | ORAL_TABLET | Freq: Once | ORAL | Status: AC
  Administered 2012-12-02: 1 mg via ORAL
  Filled 2012-12-02: qty 1

## 2012-12-02 NOTE — Telephone Encounter (Signed)
I called the patient and was told by the female answering the phone that she was in the ED @ [facility] getting ready to be examined. I told him I was returning call to address the questions that she may have for Dr. Karilyn Cota .

## 2012-12-02 NOTE — ED Provider Notes (Signed)
CSN: [id number]     Arrival date & time 12/02/12  1404 History   First MD Initiated Contact with Patient 12/02/12 1514     Chief Complaint  Patient presents with  . Abdominal Cramping   (Consider location/radiation/quality/duration/timing/severity/associated sxs/prior Treatment) Patient is a 62 y.o. female presenting with cramps. The history is provided by the patient. No language interpreter was used.  Abdominal Cramping This is a chronic problem. The problem has been gradually worsening. Associated symptoms include abdominal pain and nausea. She has tried nothing for the symptoms. The treatment provided no relief.  Pt is here for abdominal cramping and bloating for approx the last week. She describes pain throughout her belly.  She reports continuing nausea. She has a history of diverticulitis and pancreatitis. She was told by her PCP that she may have cirrhosis of the liver. She reports that he didn't do any testing but gave her a follow-up appt with her GI doctor. She had one episode of vomiting today. She reports having diarrhea for almost the whole month of August. She reports urgency and pressure when she urinates.    Past Medical History  Diagnosis Date  . Palpitations   . Tachycardia   . CAD (coronary artery disease)     Stent placement circumflex coronary 2007, catheterization 2008 patent stents. Normal LV function  . Chest pain   . Diabetes mellitus     Insulin dependent  . Dyslipidemia   . Anxiety and depression   . Obstructive sleep apnea   . Diabetic polyneuropathy      severe on multiple medications  . 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 MI (myocardial infarction)     2007  . Anxiety   . Depression    Past Surgical History  Procedure Laterality Date  . Hand surgery      right  . Coronary angioplasty with stent placement    . Colonoscopy  08/06/2011    Procedure: COLONOSCOPY;  Surgeon: Malissa Hippo, MD;  Location: [facility];  Service: Endoscopy;  Laterality: N/A;  215    Family History  Problem Relation Age of Onset  . Cancer    . Coronary artery disease     History  Substance Use Topics  . Smoking status: Never Smoker   . Smokeless tobacco: Never Used  . Alcohol Use: No   OB History   Grav Para Term Preterm Abortions TAB SAB Ect Mult Living                 Review of Systems  Gastrointestinal: Positive for nausea and abdominal pain.  All other systems reviewed and are negative.    Allergies  Nitrofuran derivatives; Amphetamine-dextroamphetamine; Pregabalin; Topiramate; and Verelan  Home Medications   Current Outpatient Rx  Name  Route  Sig  Dispense  Refill  . ALPRAZolam (XANAX) 1 MG tablet   Oral   Take 1 mg by mouth 4 (four) times daily.           Marland Kitchen aspirin EC 81 MG tablet   Oral   Take 81 mg by mouth daily.         Marland Kitchen atorvastatin (LIPITOR) 40 MG tablet   Oral   Take 40 mg by mouth daily.         . benazepril (LOTENSIN) 20 MG tablet   Oral   Take 1 tablet (20 mg total) by mouth daily.   30 tablet   6     Generic WUJ:[id number]   20MG    .  cyanocobalamin (,VITAMIN B-12,) 1000 MCG/ML injection   Intramuscular   Inject 1,000 mcg into the muscle every 30 (thirty) days.           Marland Kitchen dicyclomine (BENTYL) 10 MG capsule   Oral   Take 1 capsule (10 mg total) by mouth 2 (two) times daily before a meal.   60 capsule   3   . diphenoxylate-atropine (LOMOTIL) 2.5-0.025 MG per tablet   Oral   Take 1 tablet by mouth 4 (four) times daily as needed for diarrhea or loose stools.         Marland Kitchen estrogens, conjugated, (PREMARIN) 1.25 MG tablet   Oral   Take 1.25 mg by mouth daily.          Marland Kitchen gabapentin (NEURONTIN) 800 MG tablet   Oral   Take 800 mg by mouth 3 (three) times daily.         . insulin glargine (LANTUS) 100 UNIT/ML injection   Subcutaneous   Inject 60 Units into the skin 2 (two) times daily.          . insulin lispro (HUMALOG) 100 UNIT/ML injection   Subcutaneous   Inject 5-10 Units into the skin 2  (two) times daily. Take 10 units every morning and 5 units every evening          . lidocaine (XYLOCAINE) 5 % ointment   Topical   Apply 1 application topically Twice daily.         . meloxicam (MOBIC) 15 MG tablet   Oral   Take 1 tablet (15 mg total) by mouth daily.   30 tablet   1   . methocarbamol (ROBAXIN) 500 MG tablet   Oral   Take 1 tablet (500 mg total) by mouth 4 (four) times daily.   60 tablet   1   . metoprolol succinate (TOPROL-XL) 50 MG 24 hr tablet   Oral   Take 1 tablet (50 mg total) by mouth daily. Take with or immediately following a meal.   30 tablet   6   . nitroGLYCERIN (NITROSTAT) 0.4 MG SL tablet   Sublingual   Place 1 tablet (0.4 mg total) under the tongue every 5 (five) minutes x 3 doses as needed. For chest pains   25 tablet   3   . NON FORMULARY   Oral   Take 5 mg by mouth as needed. TABRIZATRIPTAN         . omeprazole (PRILOSEC) 20 MG capsule   Oral   Take 20 mg by mouth 2 (two) times daily.           Marland Kitchen oxymorphone (OPANA ER) 20 MG 12 hr tablet   Oral   Take 1 tablet (20 mg total) by mouth every 12 (twelve) hours.   60 tablet   0   . potassium chloride (K-DUR) 10 MEQ tablet   Oral   Take 10 mEq by mouth 2 (two) times daily.           . promethazine (PHENERGAN) 25 MG tablet   Oral   Take 25 mg by mouth every 6 (six) hours as needed. For nausea         . topiramate (TOPAMAX) 200 MG tablet   Oral   Take 200 mg by mouth daily.         Marland Kitchen torsemide (DEMADEX) 20 MG tablet   Oral   Take 20 mg by mouth 2 (two) times daily.  BP 119/70  Pulse 115  Temp(Src) 98.3 F (36.8 C) (Oral)  Resp 12  SpO2 93% Physical Exam  Nursing note and vitals reviewed. Constitutional: She is oriented to person, place, and time. She appears well-developed and well-nourished.  HENT:  Head: Normocephalic and atraumatic.  Mouth/Throat: Oropharynx is clear and moist.  Eyes: Pupils are equal, round, and reactive to light.  Neck:  Normal range of motion. Neck supple. No thyromegaly present.  Cardiovascular: Normal rate, regular rhythm, normal heart sounds and intact distal pulses.  Exam reveals no gallop and no friction rub.   No murmur heard. Pulmonary/Chest: Effort normal and breath sounds normal. No respiratory distress.  Abdominal: Soft. Normal appearance and bowel sounds are normal. There is generalized tenderness.  Musculoskeletal: Normal range of motion.  Neurological: She is alert and oriented to person, place, and time.  Skin: Skin is warm and dry.  Psychiatric: Her speech is normal and behavior is normal. Thought content normal. Her mood appears anxious.    ED Course  Procedures (including critical care time) Labs Review Labs Reviewed  URINALYSIS, ROUTINE W REFLEX MICROSCOPIC   Imaging Review No results found.  MDM   1. Abdominal pain     Abdominal exam with diffuse tenderness throughout. Complex GI history, sees Dr. Karilyn Cota. CT consistent with mild wall thickening of the colon. No evidence of bowel obstruction, no free fluid on CT. Urine negative, blood work consistent with mild dehydration. She was given a normal saline bolus while here in ER with Zofran IV. Augmentin prescription given and advised follow up with PCP and keep upcoming appt with gastro.     Irish Elders, NP 12/02/12 [phone]

## 2012-12-02 NOTE — ED Provider Notes (Signed)
Diffuse abd pain, X 3 + weeks, followed by GI, occasional vomiting and frequent diarrhea until recently - pt has stable exam other than diffusely tender abd - labs without leukocytosis and CT without convincing evidence of sig abnormality, possible colitis - d/c with zofran, augmentin.  Medical screening examination/treatment/procedure(s) were conducted as a shared visit with non-physician practitioner(s) and myself.  I personally evaluated the patient during the encounter.      Johnna Acosta, MD 12/02/12 2100

## 2012-12-02 NOTE — ED Provider Notes (Signed)
Pt is 62 y/o female with hx of pancreatitis caused by severe hypertriglyceridemia, has been treated by Dr. Laural Golden in the past and will see on 12/17/12, She has had abd pain for 1 month - has had chronic diarrhea for last month but states has stopped, feels as though her abd is distended and has had some nause and vomiting today.  She has seen Dr. Woody Seller twice this last 3 weeks and feels as if nothing is getting done for her pain and nausea.  On my exam she has an obese abd, she has no tympanitic sounds to percussion.  She has diffuse moderate ttp with guarding which is non focal but not much ttp in the RLQ.  She is tachycardic to 115, MM mildly dehdyrated, no peripheral edema.    eval with labs, CT scan and symptomatic meds.    Medical screening examination/treatment/procedure(s) were conducted as a shared visit with non-physician practitioner(s) and myself.  I personally evaluated the patient during the encounter.       Johnna Acosta, MD 12/02/12 [phone]

## 2012-12-02 NOTE — ED Notes (Signed)
Complain of abdomen cramping for two weeks. States her EDP gave her a medication but it is not working

## 2012-12-02 NOTE — ED Notes (Signed)
For prior month, patient has had diarrhea (stopped last Friday).  Was seen by PMD and has been taking Immodium.  LNBM was 3 d ago.  Had hard stool today.  Nausea has been ongoing for week, but vomited x 1 today.  Abdominal pain noted since diarrhea began w/greater intensity RLQ. Hyperresonant over R quadrants, dull over L.  States urine has been malodorous, but upon obtaining sample, it is clear, pale yellow.

## 2012-12-04 LAB — URINE CULTURE
Colony Count: NO GROWTH
Culture: NO GROWTH

## 2012-12-15 ENCOUNTER — Encounter: Payer: Self-pay | Admitting: Physical Medicine and Rehabilitation

## 2012-12-15 ENCOUNTER — Encounter
Payer: Medicaid Other | Attending: Physical Medicine and Rehabilitation | Admitting: Physical Medicine and Rehabilitation

## 2012-12-15 VITALS — BP 122/61 | HR 97 | Resp 14 | Ht 63.0 in | Wt 262.8 lb

## 2012-12-15 DIAGNOSIS — Z79899 Other long term (current) drug therapy: Secondary | ICD-10-CM | POA: Insufficient documentation

## 2012-12-15 DIAGNOSIS — M6250 Muscle wasting and atrophy, not elsewhere classified, unspecified site: Secondary | ICD-10-CM | POA: Insufficient documentation

## 2012-12-15 DIAGNOSIS — E1142 Type 2 diabetes mellitus with diabetic polyneuropathy: Secondary | ICD-10-CM | POA: Insufficient documentation

## 2012-12-15 DIAGNOSIS — M47817 Spondylosis without myelopathy or radiculopathy, lumbosacral region: Secondary | ICD-10-CM

## 2012-12-15 DIAGNOSIS — E1149 Type 2 diabetes mellitus with other diabetic neurological complication: Secondary | ICD-10-CM | POA: Insufficient documentation

## 2012-12-15 DIAGNOSIS — M5137 Other intervertebral disc degeneration, lumbosacral region: Secondary | ICD-10-CM | POA: Insufficient documentation

## 2012-12-15 DIAGNOSIS — Z794 Long term (current) use of insulin: Secondary | ICD-10-CM | POA: Insufficient documentation

## 2012-12-15 DIAGNOSIS — M51379 Other intervertebral disc degeneration, lumbosacral region without mention of lumbar back pain or lower extremity pain: Secondary | ICD-10-CM | POA: Insufficient documentation

## 2012-12-15 DIAGNOSIS — IMO0001 Reserved for inherently not codable concepts without codable children: Secondary | ICD-10-CM

## 2012-12-15 MED ORDER — OXYMORPHONE HCL ER 20 MG PO TB12: 20.0000 mg | ORAL_TABLET | Freq: Two times a day (BID) | ORAL | Status: DC

## 2012-12-15 MED ORDER — TRAMADOL HCL 50 MG PO TABS: 50.0000 mg | ORAL_TABLET | Freq: Three times a day (TID) | ORAL | Status: DC | PRN

## 2012-12-15 NOTE — Patient Instructions (Signed)
Start with the tramadol with one tablet for three days then add the second tablet , so you are taking 1 tablet twice a day for breakthrough pain

## 2012-12-15 NOTE — Progress Notes (Signed)
Subjective:    Patient ID: [name], female    DOB: 1950-12-19, 62 y.o.   MRN: [id number]  HPI The patient is a 62 year old female female, who presents with LBP . The patient complains about moderate to severe pain, which radiates into her right LE, in a non-radicular pattern. Patient also complains about numbness and tingling in her entire right LE . She describes the pain as aching and stabbing . Applying heat and/or ice, taking medications , changing positions alleviate the symptoms. Prolonged standing, lying straight aggrevates the symptoms. The patient grades her pain as a 8/10. The patient states that the pain medication is not sufficient, she would like to get something for breakthrough pain.  Pain Inventory Average Pain 7 Pain Right Now 8 My pain is sharp, burning, stabbing, tingling and aching  In the last 24 hours, has pain interfered with the following? General activity 8 Relation with others 5 Enjoyment of life 7 What TIME of day is your pain at its worst? night Sleep (in general) Poor  Pain is worse with: walking, bending, standing, some activites and other Pain improves with: heat/ice and medication Relief from Meds: 4  Mobility walk with assistance use a cane use a walker how many minutes can you walk? 10 ability to climb steps?  no do you drive?  yes Do you have any goals in this area?  yes  Function disabled: date disabled na I need assistance with the following:  meal prep, household duties and shopping  Neuro/Psych bladder control problems weakness tingling spasms dizziness depression anxiety  Prior Studies Any changes since last visit?  yes CT/MRI  Physicians involved in your care Any changes since last visit?  no   Family History  Problem Relation Age of Onset  . Cancer    . Coronary artery disease     History   Social History  . Marital Status: Legally Separated    Spouse Name: N/A    Number of Children: N/A  . Years of  Education: N/A   Occupational History  . Disabled    Social History Main Topics  . Smoking status: Never Smoker   . Smokeless tobacco: Never Used  . Alcohol Use: No  . Drug Use: No  . Sexual Activity: None   Other Topics Concern  . None   Social History Narrative   Patient does not get regular exercise   Past Surgical History  Procedure Laterality Date  . Hand surgery      right  . Coronary angioplasty with stent placement    . Colonoscopy  08/06/2011    Procedure: COLONOSCOPY;  Surgeon: Malissa Hippo, MD;  Location: [facility];  Service: Endoscopy;  Laterality: N/A;  215   Past Medical History  Diagnosis Date  . Palpitations   . Tachycardia   . CAD (coronary artery disease)     Stent placement circumflex coronary 2007, catheterization 2008 patent stents. Normal LV function  . Chest pain   . Diabetes mellitus     Insulin dependent  . Dyslipidemia   . Anxiety and depression   . Obstructive sleep apnea   . Diabetic polyneuropathy      severe on multiple medications  . 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 MI (myocardial infarction)     2007  . Anxiety   . Depression    BP 122/61  Pulse 97  Resp 14  Ht 5\' 3"  (1.6 m)  Wt 262 lb 12.8 oz (119.205 kg)  BMI 46.56 kg/m2  SpO2 96%     Review of Systems  Respiratory: Positive for apnea, cough and shortness of breath.   Cardiovascular: Positive for leg swelling.  Gastrointestinal: Positive for nausea, vomiting, abdominal pain and diarrhea.  Genitourinary:       Bladder control problems  Neurological: Positive for dizziness and weakness.       Tingling  Psychiatric/Behavioral: Positive for dysphoric mood. The patient is nervous/anxious.   All other systems reviewed and are negative.       Objective:   Physical Exam Constitutional: She is oriented to person, place, and time. She appears well-developed and well-nourished.  Morbidly obese, walks with a cane  HENT:  Head: Normocephalic.  Neck: Neck supple.   Musculoskeletal: She exhibits tenderness.  Neurological: She is alert and oriented to person, place, and time.  Skin: Skin is warm and dry.  Psychiatric: She has a normal mood and affect.  Symmetric normal motor tone is noted throughout. Normal muscle bulk. Muscle testing reveals 5/5 muscle strength of the upper extremity, and 5/5 of the lower extremity. Full range of motion in upper and lower extremities. ROM of spine is restricted. Fine motor movements are normal in both hands.  Sensory is decreased in lower legs bilateral.  DTR in the upper and lower extremity are present and symmetric 1+. No clonus is noted.  Patient arises from chair with difficulty. Wide based gait with normal arm swing bilateral , able to stand on heels and toes . Tandem walk is only possible with some support. No pronator drift. Rhomberg negative        Assessment & Plan:  This is a 62 year old female with  1.Lumbar spondylosis, recent MRI, 10/13/12, showed :  Disc and facet degeneration at L4-5 with mild spinal stenosis,  unchanged  Mild degenerative change L5-S1 with mild foraminal encroachment  bilaterally.  2.DM, with diabetic neuropathy  3.Morbidly obese  4.Deconditioned  Plan :  Recommended aquatic exercising, which would be beneficial for all of her medical problems. Unfortunately the patient's insurance is medicaid, which does not pay for aquatic PT. Advised patient to ask her local [employer], whether they support patients who are disabled, to help with the member fees.  Patient stated she tried , but she still can not afford exercising in a pool.   Showed patient some stretches and core exercises she can do at home.  Prescribed Mobic for exacerbations of her LBP/ facet degeneration.  Explained her MRI in great detail, went over images with patient at her last visit.  Refilled her Opana, 20mg  bid , prescribed Tramadol 50mg  , bid for breakthrough pain, advised patient to start with one tablet for 3 days and  then take 1 tablet twice a day. Spent at least 25 min with patient face to face.   Patient does not want to have any other injection into her back at this point, after the last ones, did not help, but increased her symptoms.  Follow up in 1 month with Dr. Doroteo Bradford to discuss medications.

## 2012-12-17 ENCOUNTER — Ambulatory Visit (INDEPENDENT_AMBULATORY_CARE_PROVIDER_SITE_OTHER): Payer: Medicaid Other | Admitting: Internal Medicine

## 2012-12-17 ENCOUNTER — Encounter (INDEPENDENT_AMBULATORY_CARE_PROVIDER_SITE_OTHER): Payer: Self-pay | Admitting: Internal Medicine

## 2012-12-17 ENCOUNTER — Telehealth (INDEPENDENT_AMBULATORY_CARE_PROVIDER_SITE_OTHER): Payer: Self-pay | Admitting: *Deleted

## 2012-12-17 ENCOUNTER — Other Ambulatory Visit (INDEPENDENT_AMBULATORY_CARE_PROVIDER_SITE_OTHER): Payer: Self-pay | Admitting: *Deleted

## 2012-12-17 VITALS — BP 104/68 | HR 94 | Temp 98.1°F | Resp 16 | Ht 66.0 in | Wt 261.0 lb

## 2012-12-17 DIAGNOSIS — R109 Unspecified abdominal pain: Secondary | ICD-10-CM

## 2012-12-17 DIAGNOSIS — R1013 Epigastric pain: Secondary | ICD-10-CM

## 2012-12-17 DIAGNOSIS — K5289 Other specified noninfective gastroenteritis and colitis: Secondary | ICD-10-CM

## 2012-12-17 DIAGNOSIS — K5090 Crohn's disease, unspecified, without complications: Secondary | ICD-10-CM

## 2012-12-17 DIAGNOSIS — Z800 Family history of malignant neoplasm of digestive organs: Secondary | ICD-10-CM

## 2012-12-17 DIAGNOSIS — R1310 Dysphagia, unspecified: Secondary | ICD-10-CM

## 2012-12-17 DIAGNOSIS — Z1211 Encounter for screening for malignant neoplasm of colon: Secondary | ICD-10-CM

## 2012-12-17 DIAGNOSIS — K529 Noninfective gastroenteritis and colitis, unspecified: Secondary | ICD-10-CM

## 2012-12-17 NOTE — Patient Instructions (Signed)
Discontinue Augmentin. Degrees meloxicam 7.5 mg daily if possible; always take it with food. EGD, EGD and colonoscopy to be scheduled

## 2012-12-17 NOTE — Telephone Encounter (Signed)
Patient needs movi prep 

## 2012-12-17 NOTE — Progress Notes (Signed)
Presenting complaint;  Epigastric and left upper quadrant abdominal pain. Dysphagia.  Subjective:  Patient is 62 year old Caucasian female who presents with four-week history of epigastric and left upper quadrant pain and dysphagia of a few months duration. She was last seen in April 2013 for diarrhea. Today she is accompanied by her fianc Joe. She states she had diarrhea for 4 months in August this year. As her diarrhea improved she began to have pain in the epigastric region and left upper quadrant. Pain described as recurrent sharp fairly localized. 2 weeks ago pain became unbearable and she reported to the emergency room at [facility]. She was found to have thickening to transverse colon and felt to have colitis. She was prescribed Augmentin. She has 2 or 3 doses left. While pain is not as severe but has not gone away completely. She complains of nausea and sporadic vomiting. She states her heartburn is well controlled with therapy. She is back to having formed stools. She denies melena or rectal bleeding. She has gained 17 pounds since her last visit 18 months ago. She has chronic back pain as well as pain in her both lower extremities secondary to neuropathy and unable to walk long distance. She is also struggling with lower extremity edema. She says the swelling would not go down despite taking double dose of fluid medication. She is trying to do water aerobics but her insurance would not cover. As for as her dysphagia is concerned she strangles frequently on liquids and she also has difficulty with solids. She states it gets stuck in the upper sternal region but eventually passes down. She says heartburn is well controlled with therapy. Her last esophagus was dilated several years ago. Since last colonoscopy was in October 2007 for chronic diarrhea. It was a normal exam and similarity biopsy from sigmoid colon was negative for microscopic or collagenous colitis. Followup colonoscopy was advised and 2012  at did not materialize.  Current Medications: Current Outpatient Prescriptions  Medication Sig Dispense Refill  . ALPRAZolam (XANAX) 1 MG tablet Take 1 mg by mouth 4 (four) times daily.        Marland Kitchen amoxicillin-clavulanate (AUGMENTIN) 875-125 MG per tablet Take 1 tablet by mouth 2 (two) times daily.  28 tablet  0  . aspirin EC 81 MG tablet Take 81 mg by mouth daily.      Marland Kitchen atorvastatin (LIPITOR) 40 MG tablet Take 40 mg by mouth daily.      . benazepril (LOTENSIN) 20 MG tablet Take 1 tablet (20 mg total) by mouth daily.  30 tablet  6  . diphenoxylate-atropine (LOMOTIL) 2.5-0.025 MG per tablet Take 1 tablet by mouth 4 (four) times daily as needed for diarrhea or loose stools.      Marland Kitchen estrogens, conjugated, (PREMARIN) 1.25 MG tablet Take 1.25 mg by mouth daily.       Marland Kitchen gabapentin (NEURONTIN) 800 MG tablet Take 800 mg by mouth 3 (three) times daily.      . insulin glargine (LANTUS) 100 UNIT/ML injection Inject 60 Units into the skin 2 (two) times daily.       . insulin lispro (HUMALOG) 100 UNIT/ML injection Inject into the skin. Per patient this is now uses as a sliding scale.      . lidocaine (XYLOCAINE) 5 % ointment Apply 1 application topically at bedtime.       . meloxicam (MOBIC) 15 MG tablet Take 1 tablet (15 mg total) by mouth daily.  30 tablet  1  . methocarbamol (ROBAXIN)  500 MG tablet Take 1 tablet (500 mg total) by mouth 4 (four) times daily.  60 tablet  1  . metoprolol succinate (TOPROL-XL) 50 MG 24 hr tablet Take 1 tablet (50 mg total) by mouth daily. Take with or immediately following a meal.  30 tablet  6  . nitroGLYCERIN (NITROSTAT) 0.4 MG SL tablet Place 1 tablet (0.4 mg total) under the tongue every 5 (five) minutes x 3 doses as needed. For chest pains  25 tablet  3  . omeprazole (PRILOSEC) 20 MG capsule Take 20 mg by mouth 2 (two) times daily.        . ondansetron (ZOFRAN) 4 MG tablet Take 1 tablet (4 mg total) by mouth every 6 (six) hours.  12 tablet  0  . oxymorphone (OPANA ER) 20  MG 12 hr tablet Take 1 tablet (20 mg total) by mouth every 12 (twelve) hours.  60 tablet  0  . potassium chloride (K-DUR) 10 MEQ tablet Take 20 mEq by mouth 2 (two) times daily.       . rizatriptan (MAXALT-MLT) 5 MG disintegrating tablet Take 5 mg by mouth as needed for migraine. May repeat in 2 hours if needed      . topiramate (TOPAMAX) 200 MG tablet Take 200 mg by mouth daily.      Marland Kitchen torsemide (DEMADEX) 20 MG tablet Take 60 mg by mouth daily.       . traMADol (ULTRAM) 50 MG tablet Take 1 tablet (50 mg total) by mouth every 8 (eight) hours as needed for pain.  60 tablet  0  . [DISCONTINUED] diltiazem (TIAZAC) 180 MG 24 hr capsule Take 1 capsule (180 mg total) by mouth daily.  30 capsule  1   No current facility-administered medications for this visit.   Past Medical History  Diagnosis Date  . Palpitations   . Tachycardia   . CAD (coronary artery disease)     Stent placement circumflex coronary 2007, catheterization 2008 patent stents. Normal LV function  . Chest pain   . Diabetes mellitus     Insulin dependent  . Dyslipidemia   . Anxiety and depression   . Obstructive sleep apnea   . Diabetic polyneuropathy      severe on multiple medications  . 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 MI (myocardial infarction)     2007  . Anxiety   . Depression    Allergies  Allergen Reactions  . Nitrofuran Derivatives Itching and Swelling  . Amphetamine-Dextroamphetamine Swelling  . Pregabalin Swelling  . Topiramate Other (See Comments)    Tongue tingle  . Verelan [Verapamil] Rash   Family history; Mother died of colon carcinoma in her early 7s. Old brother had API for rectal carcinoma about 2 years ago. He is in his early 30s.  Objective: Blood pressure 104/68, pulse 94, temperature 98.1 F (36.7 C), temperature source Oral, resp. rate 16, height 5' 6"  (1.676 m), weight 261 lb (118.389 kg). Patient is alert and in no acute distress Conjunctiva is pink. Sclera is nonicteric Oropharyngeal mucosa  is normal. No neck masses or thyromegaly noted. Cardiac exam with regular rhythm normal S1 and S2. No murmur or gallop noted. Lungs are clear to auscultation. Abdomen abdomen is protuberant. Bowel sounds are normal. No bruits noted. Abdomen is soft with mild to moderate mid epigastric and left upper quadrant tenderness with some guarding at LUQ. No organomegaly or masses noted. Rectal examination deferred. She has 1+ pain involving both legs. There is slight erythema the  skin but no known noted.  Labs/studies Results: Abdominopelvic CT from 12/02/2012 reviewed. Blood work also reviewed from the same date. CBC within normal limits. Chemistry panel pertinent for serum potassium of 3.3, right 87, CO2 38, BUN 32 and creatinine 1.05. Serum calcium 10.8 and albumin 3.4. Bilirubin oh 0.4, BP 117, AST 12 and ALT 9. Lipase was 18.     Assessment:  #1. Abdominal pain which is centered in the epigastric region and left upper quadrant. CT 2 weeks ago show changes of transverse colitis and she has been empirically treated with Augmentin without symptomatic improvement. She does not have diarrhea. She is on NSAIDs. She therefore could have peptic ulcer disease to account for some of her pain. LUQ pain may be secondary to colitis. Serum calcium is mildly elevated but this may be function of diuretic therapy since her BUN is elevated. #2. Dysphagia. GERD symptoms are well controlled with therapy. She has both pharyngeal and esophageal dysphagia. If she does not respond to esophageal dilation she may need evaluation by speech pathologist. #3. Obesity. Patient at risk for fatty liver disease her transaminases 2 weeks ago her normal. #4. She is high risk for colorectal carcinoma given given family history of CRC in her mother and a brother.   Plan:  Patient advised to decrease meloxicam dose to 7.5 mg by mouth daily. Patient advised to discontinue Augmentin. Esophagogastroduodenoscopy with esophageal  dilation and colonoscopy to be performed with monitored anesthesia care. She will have metabolic 7/calcium repeated prior to endoscopic evaluation.

## 2012-12-20 ENCOUNTER — Encounter (HOSPITAL_COMMUNITY): Payer: Self-pay | Admitting: Pharmacy Technician

## 2012-12-20 MED ORDER — PEG-KCL-NACL-NASULF-NA ASC-C 100 G PO SOLR: 1.0000 | Freq: Once | ORAL | Status: DC

## 2012-12-27 ENCOUNTER — Encounter (HOSPITAL_COMMUNITY)
Admission: RE | Admit: 2012-12-27 | Discharge: 2012-12-27 | Disposition: A | Discharge status: Home / Self Care | Payer: Medicaid Other | Source: Ambulatory Visit | Attending: Internal Medicine | Admitting: Internal Medicine

## 2012-12-27 ENCOUNTER — Encounter (HOSPITAL_COMMUNITY): Payer: Self-pay

## 2012-12-27 ENCOUNTER — Ambulatory Visit (INDEPENDENT_AMBULATORY_CARE_PROVIDER_SITE_OTHER): Payer: Medicaid Other | Admitting: Internal Medicine

## 2012-12-27 VITALS — BP 107/65 | HR 107 | Temp 97.6°F | Resp 22 | Ht 66.0 in | Wt 251.8 lb

## 2012-12-27 DIAGNOSIS — Z01812 Encounter for preprocedural laboratory examination: Secondary | ICD-10-CM | POA: Insufficient documentation

## 2012-12-27 DIAGNOSIS — R109 Unspecified abdominal pain: Secondary | ICD-10-CM

## 2012-12-27 DIAGNOSIS — R1310 Dysphagia, unspecified: Secondary | ICD-10-CM

## 2012-12-27 DIAGNOSIS — K5090 Crohn's disease, unspecified, without complications: Secondary | ICD-10-CM

## 2012-12-27 DIAGNOSIS — Z800 Family history of malignant neoplasm of digestive organs: Secondary | ICD-10-CM

## 2012-12-27 HISTORY — DX: Chronic obstructive pulmonary disease, unspecified: J44.9

## 2012-12-27 HISTORY — DX: Gastro-esophageal reflux disease without esophagitis: K21.9

## 2012-12-27 LAB — BASIC METABOLIC PANEL
BUN: 15 mg/dL (ref 6–23)
CO2: 33 mEq/L — ABNORMAL HIGH (ref 19–32)
Calcium: 9.5 mg/dL (ref 8.4–10.5)
Chloride: 87 mEq/L — ABNORMAL LOW (ref 96–112)
Creatinine, Ser: 0.73 mg/dL (ref 0.50–1.10)
GFR calc Af Amer: >90 mL/min (ref >90)
GFR calc non Af Amer: >90 mL/min (ref >90)
Glucose, Bld: 262 mg/dL — ABNORMAL HIGH (ref 70–99)
Potassium: 3.1 mEq/L — ABNORMAL LOW (ref 3.5–5.1)
Sodium: 136 mEq/L (ref 135–145)

## 2012-12-27 NOTE — Patient Instructions (Signed)
[name]  12/27/2012   Your procedure is scheduled on:  12/31/12  Report to [facility] at 06:15 AM.  Call this number if you have problems the morning of surgery: [phone]   Remember:   Do not eat food or drink liquids after midnight.   Take these medicines the morning of surgery with A SIP OF WATER: Xanax, Benazepril, Gabapentin, Mobic, Methocarbamol, Metoprolol, Topiramate and Omeprazole. You make your Zofran, Opana and Tramadol if needed. Take only half of your dose of Lantus the night before surgery. Do not take any insulin the morning of surgery.   Do not wear jewelry, make-up or nail polish.  Do not wear lotions, powders, or perfumes.   Do not shave 48 hours prior to surgery. Men may shave face and neck.  Do not bring valuables to the hospital.  [facility] is not responsible for any belongings or valuables.               Contacts, dentures or bridgework may not be worn into surgery.  Leave suitcase in the car. After surgery it may be brought to your room.  For patients admitted to the hospital, discharge time is determined by your treatment team.               Patients discharged the day of surgery will not be allowed to drive home.   Special Instructions: Shower using CHG 2 nights before surgery and the night before surgery.  If you shower the day of surgery use CHG.  Use special wash - you have one bottle of CHG for all showers.  You should use approximately 1/3 of the bottle for each shower.   Please read over the following fact sheets that you were given: Anesthesia Post-op Instructions and Care and Recovery After Surgery    Esophagogastroduodenoscopy Esophagogastroduodenoscopy (EGD) is a procedure to examine the lining of the esophagus, stomach, and first part of the small intestine (duodenum). A long, flexible, lighted tube with a camera attached (endoscope) is inserted down the throat to view these organs. This procedure is done to detect problems or  abnormalities, such as inflammation, bleeding, ulcers, or growths, in order to treat them. The procedure lasts about 5 20 minutes. It is usually an outpatient procedure, but it may need to be performed in emergency cases in the hospital. LET YOUR CAREGIVER KNOW ABOUT:   Allergies to food or medicine.  All medicines you are taking, including vitamins, herbs, eyedrops, and over-the-counter medicines and creams.  Use of steroids (by mouth or creams).  Previous problems you or members of your family have had with the use of anesthetics.  Any blood disorders you have.  Previous surgeries you have had.  Other health problems you have.  Possibility of pregnancy, if this applies. RISKS AND COMPLICATIONS  Generally, EGD is a safe procedure. However, as with any procedure, complications can occur. Possible complications include:  Infection.  Bleeding.  Tearing (perforation) of the esophagus, stomach, or duodenum.  Difficulty breathing or not being able to breath.  Excessive sweating.  Spasms of the larynx.  Slowed heartbeat.  Low blood pressure. BEFORE THE PROCEDURE  Do not eat or drink anything for 6 8 hours before the procedure or as directed by your caregiver.  Ask your caregiver about changing or stopping your regular medicines.  If you wear dentures, be prepared to remove them before the procedure.  Arrange for someone to drive you home after the procedure. PROCEDURE   A vein will  be accessed to give medicines and fluids. A medicine to relax you (sedative) and a pain reliever will be given through that access into the vein.  A numbing medicine (local anesthetic) may be sprayed on your throat for comfort and to stop you from gagging or coughing.  A mouth guard may be placed in your mouth to protect your teeth and to keep you from biting on the endoscope.  You will be asked to lie on your left side.  The endoscope is inserted down your throat and into the esophagus,  stomach, and duodenum.  Air is put through the endoscope to allow your caregiver to view the lining of your esophagus clearly.  The esophagus, stomach, and duodenum is then examined. During the exam, your caregiver may:  Remove tissue to be examined under a microscope (biopsy) for inflammation, infection, or other medical problems.  Remove growths.  Remove objects (foreign bodies) that are stuck.  Treat any bleeding with medicines or other devices that stop tissues from bleeding (hot cauters, clipping devices).  Widen (dilate) or stretch narrowed areas of the esophagus and stomach.  The endoscope will then be withdrawn. AFTER THE PROCEDURE  You will be taken to a recovery area to be monitored. You will be able to go home once you are stable and alert.  Do not eat or drink anything until the local anesthetic and numbing medicines have worn off. You may choke.  It is normal to feel bloated, have pain with swallowing, or have a sore throat for a short time. This will wear off.  Your caregiver should be able to discuss his or her findings with you. It will take longer to discuss the test results if any biopsies were taken. Document Released: 07/04/2004 Document Revised: 02/18/2012 Document Reviewed: 02/04/2012 [facility] Patient Information 2014 [place], [place].    Colonoscopy A colonoscopy is an exam to evaluate your entire colon. In this exam, your colon is cleansed. A long fiberoptic tube is inserted through your rectum and into your colon. The fiberoptic scope (endoscope) is a long bundle of enclosed and very flexible fibers. These fibers transmit light to the area examined and send images from that area to your caregiver. Discomfort is usually minimal. You may be given a drug to help you sleep (sedative) during or prior to the procedure. This exam helps to detect lumps (tumors), polyps, inflammation, and areas of bleeding. Your caregiver may also take a small piece of tissue (biopsy)  that will be examined under a microscope. LET YOUR CAREGIVER KNOW ABOUT:   Allergies to food or medicine.  Medicines taken, including vitamins, herbs, eyedrops, over-the-counter medicines, and creams.  Use of steroids (by mouth or creams).  Previous problems with anesthetics or numbing medicines.  History of bleeding problems or blood clots.  Previous surgery.  Other health problems, including diabetes and kidney problems.  Possibility of pregnancy, if this applies. BEFORE THE PROCEDURE   A clear liquid diet may be required for 2 days before the exam.  Ask your caregiver about changing or stopping your regular medications.  Liquid injections (enemas) or laxatives may be required.  A large amount of electrolyte solution may be given to you to drink over a short period of time. This solution is used to clean out your colon.  You should be present 60 minutes prior to your procedure or as directed by your caregiver. AFTER THE PROCEDURE   If you received a sedative or pain relieving medication, you will need to arrange for  someone to drive you home.  Occasionally, there is a little blood passed with the first bowel movement. Do not be concerned. FINDING OUT THE RESULTS OF YOUR TEST Not all test results are available during your visit. If your test results are not back during the visit, make an appointment with your caregiver to find out the results. Do not assume everything is normal if you have not heard from your caregiver or the medical facility. It is important for you to follow up on all of your test results. HOME CARE INSTRUCTIONS   It is not unusual to pass moderate amounts of gas and experience mild abdominal cramping following the procedure. This is due to air being used to inflate your colon during the exam. Walking or a warm pack on your belly (abdomen) may help.  You may resume all normal meals and activities after sedatives and medicines have worn off.  Only take  over-the-counter or prescription medicines for pain, discomfort, or fever as directed by your caregiver. Do not use aspirin or blood thinners if a biopsy was taken. Consult your caregiver for medicine usage if biopsies were taken. SEEK IMMEDIATE MEDICAL CARE IF:   You have a fever.  You pass large blood clots or fill a toilet with blood following the procedure. This may also occur 10 to 14 days following the procedure. This is more likely if a biopsy was taken.  You develop abdominal pain that keeps getting worse and cannot be relieved with medicine. Document Released: 02/29/2000 Document Revised: 05/26/2011 Document Reviewed: 10/14/2007 [facility] Patient Information 2014 [place], [place].    PATIENT INSTRUCTIONS POST-ANESTHESIA  IMMEDIATELY FOLLOWING SURGERY:  Do not drive or operate machinery for the first twenty four hours after surgery.  Do not make any important decisions for twenty four hours after surgery or while taking narcotic pain medications or sedatives.  If you develop intractable nausea and vomiting or a severe headache please notify your doctor immediately.  FOLLOW-UP:  Please make an appointment with your surgeon as instructed. You do not need to follow up with anesthesia unless specifically instructed to do so.  WOUND CARE INSTRUCTIONS (if applicable):  Keep a dry clean dressing on the anesthesia/puncture wound site if there is drainage.  Once the wound has quit draining you may leave it open to air.  Generally you should leave the bandage intact for twenty four hours unless there is drainage.  If the epidural site drains for more than 36-48 hours please call the anesthesia department.  QUESTIONS?:  Please feel free to call your physician or the hospital operator if you have any questions, and they will be happy to assist you.

## 2012-12-28 NOTE — Progress Notes (Signed)
Pt's pre-op labwork showed a K+ level of 3.1. Pt takes Torsemide and Potassium. Dr. Jayme Cloud made aware. No orders given.

## 2012-12-31 ENCOUNTER — Ambulatory Visit (HOSPITAL_COMMUNITY)
Admission: RE | Admit: 2012-12-31 | Discharge: 2012-12-31 | Disposition: A | Discharge status: Home / Self Care | Payer: Medicaid Other | Source: Ambulatory Visit | Attending: Internal Medicine | Admitting: Internal Medicine

## 2012-12-31 ENCOUNTER — Encounter (HOSPITAL_COMMUNITY): Payer: Medicaid Other | Admitting: Anesthesiology

## 2012-12-31 ENCOUNTER — Ambulatory Visit (HOSPITAL_COMMUNITY): Payer: Medicaid Other | Admitting: Anesthesiology

## 2012-12-31 ENCOUNTER — Encounter (HOSPITAL_COMMUNITY)
Admission: RE | Disposition: A | Discharge status: Home / Self Care | Payer: Self-pay | Source: Ambulatory Visit | Attending: Internal Medicine

## 2012-12-31 DIAGNOSIS — D131 Benign neoplasm of stomach: Secondary | ICD-10-CM

## 2012-12-31 DIAGNOSIS — J449 Chronic obstructive pulmonary disease, unspecified: Secondary | ICD-10-CM | POA: Insufficient documentation

## 2012-12-31 DIAGNOSIS — R1012 Left upper quadrant pain: Secondary | ICD-10-CM | POA: Insufficient documentation

## 2012-12-31 DIAGNOSIS — R109 Unspecified abdominal pain: Secondary | ICD-10-CM

## 2012-12-31 DIAGNOSIS — Z800 Family history of malignant neoplasm of digestive organs: Secondary | ICD-10-CM

## 2012-12-31 DIAGNOSIS — K2940 Chronic atrophic gastritis without bleeding: Secondary | ICD-10-CM

## 2012-12-31 DIAGNOSIS — R1310 Dysphagia, unspecified: Secondary | ICD-10-CM | POA: Insufficient documentation

## 2012-12-31 DIAGNOSIS — Z794 Long term (current) use of insulin: Secondary | ICD-10-CM | POA: Insufficient documentation

## 2012-12-31 DIAGNOSIS — E119 Type 2 diabetes mellitus without complications: Secondary | ICD-10-CM | POA: Insufficient documentation

## 2012-12-31 DIAGNOSIS — Z8719 Personal history of other diseases of the digestive system: Secondary | ICD-10-CM

## 2012-12-31 DIAGNOSIS — K644 Residual hemorrhoidal skin tags: Secondary | ICD-10-CM

## 2012-12-31 DIAGNOSIS — J4489 Other specified chronic obstructive pulmonary disease: Secondary | ICD-10-CM | POA: Insufficient documentation

## 2012-12-31 DIAGNOSIS — Z01812 Encounter for preprocedural laboratory examination: Secondary | ICD-10-CM | POA: Insufficient documentation

## 2012-12-31 DIAGNOSIS — K5090 Crohn's disease, unspecified, without complications: Secondary | ICD-10-CM

## 2012-12-31 HISTORY — PX: COLONOSCOPY WITH PROPOFOL: SHX5780

## 2012-12-31 HISTORY — PX: ESOPHAGOGASTRODUODENOSCOPY (EGD) WITH PROPOFOL: SHX5813

## 2012-12-31 HISTORY — PX: MALONEY DILATION: SHX5535

## 2012-12-31 LAB — GLUCOSE, CAPILLARY
Glucose-Capillary: 165 mg/dL — ABNORMAL HIGH (ref 70–99)
Glucose-Capillary: 135 mg/dL — ABNORMAL HIGH (ref 70–99)

## 2012-12-31 SURGERY — COLONOSCOPY WITH PROPOFOL
Anesthesia: Monitor Anesthesia Care | Site: Rectum

## 2012-12-31 MED ORDER — PROPOFOL INFUSION 10 MG/ML OPTIME
INTRAVENOUS | Status: DC | PRN
  Administered 2012-12-31: 125 ug/kg/min via INTRAVENOUS
  Administered 2012-12-31: 25 ug/kg/min via INTRAVENOUS

## 2012-12-31 MED ORDER — ONDANSETRON HCL 4 MG/2ML IJ SOLN
4.0000 mg | Freq: Once | INTRAMUSCULAR | Status: AC
  Administered 2012-12-31: 4 mg via INTRAVENOUS

## 2012-12-31 MED ORDER — LACTATED RINGERS IV SOLN
INTRAVENOUS | Status: DC
  Administered 2012-12-31: 1000 mL via INTRAVENOUS
  Administered 2012-12-31: 08:00:00 via INTRAVENOUS

## 2012-12-31 MED ORDER — FENTANYL CITRATE 0.05 MG/ML IJ SOLN: 25.0000 ug | INTRAMUSCULAR | Status: DC | PRN

## 2012-12-31 MED ORDER — DICYCLOMINE HCL 10 MG PO CAPS: 10.0000 mg | ORAL_CAPSULE | Freq: Three times a day (TID) | ORAL | Status: DC | PRN

## 2012-12-31 MED ORDER — FENTANYL CITRATE 0.05 MG/ML IJ SOLN
INTRAMUSCULAR | Status: AC
  Filled 2012-12-31: qty 2

## 2012-12-31 MED ORDER — BUTAMBEN-TETRACAINE-BENZOCAINE 2-2-14 % EX AERO
INHALATION_SPRAY | CUTANEOUS | Status: DC | PRN
  Administered 2012-12-31: 2 via TOPICAL

## 2012-12-31 MED ORDER — ONDANSETRON HCL 4 MG/2ML IJ SOLN: 4.0000 mg | Freq: Once | INTRAMUSCULAR | Status: DC | PRN

## 2012-12-31 MED ORDER — GLYCOPYRROLATE 0.2 MG/ML IJ SOLN
0.2000 mg | Freq: Once | INTRAMUSCULAR | Status: AC
  Administered 2012-12-31: 0.2 mg via INTRAVENOUS

## 2012-12-31 MED ORDER — FENTANYL CITRATE 0.05 MG/ML IJ SOLN
25.0000 ug | INTRAMUSCULAR | Status: AC
  Administered 2012-12-31: 25 ug via INTRAVENOUS

## 2012-12-31 MED ORDER — PROPOFOL 10 MG/ML IV BOLUS
INTRAVENOUS | Status: DC | PRN
  Administered 2012-12-31: 30 mg via INTRAVENOUS

## 2012-12-31 MED ORDER — LIDOCAINE HCL (PF) 1 % IJ SOLN
INTRAMUSCULAR | Status: AC
  Filled 2012-12-31: qty 5

## 2012-12-31 MED ORDER — PROPOFOL 10 MG/ML IV BOLUS
INTRAVENOUS | Status: AC
  Filled 2012-12-31: qty 20

## 2012-12-31 MED ORDER — FENTANYL CITRATE 0.05 MG/ML IJ SOLN
INTRAMUSCULAR | Status: DC | PRN
  Administered 2012-12-31 (×3): 25 ug via INTRAVENOUS

## 2012-12-31 MED ORDER — MELOXICAM 15 MG PO TABS: 15.0000 mg | ORAL_TABLET | Freq: Every day | ORAL | Status: DC | PRN

## 2012-12-31 MED ORDER — MIDAZOLAM HCL 2 MG/2ML IJ SOLN
INTRAMUSCULAR | Status: AC
  Filled 2012-12-31: qty 2

## 2012-12-31 MED ORDER — LIDOCAINE HCL (CARDIAC) 10 MG/ML IV SOLN
INTRAVENOUS | Status: DC | PRN
  Administered 2012-12-31: 30 mg via INTRAVENOUS
  Administered 2012-12-31: 10 mg via INTRAVENOUS

## 2012-12-31 MED ORDER — MIDAZOLAM HCL 2 MG/2ML IJ SOLN
1.0000 mg | INTRAMUSCULAR | Status: DC | PRN
  Administered 2012-12-31: 2 mg via INTRAVENOUS

## 2012-12-31 MED ORDER — GLYCOPYRROLATE 0.2 MG/ML IJ SOLN
INTRAMUSCULAR | Status: AC
  Filled 2012-12-31: qty 1

## 2012-12-31 MED ORDER — ONDANSETRON HCL 4 MG/2ML IJ SOLN
INTRAMUSCULAR | Status: AC
  Filled 2012-12-31: qty 2

## 2012-12-31 MED ORDER — STERILE WATER FOR IRRIGATION IR SOLN
Status: DC | PRN
  Administered 2012-12-31: 08:00:00

## 2012-12-31 SURGICAL SUPPLY — 36 items
BALLN CRE LF 10-12 240X5.5 (BALLOONS)
BALLN DILATOR CRE 12-15 240 (BALLOONS)
BALLN DILATOR CRE 15-18 240 (BALLOONS) IMPLANT
BALLN DILATOR CRE 18-20 240 (BALLOONS) IMPLANT
BALLN DILATOR CRE WIREGUIDE (BALLOONS)
BALLOON CRE LF 10-12 240X5.5 (BALLOONS) IMPLANT
BALLOON DILATOR CRE 12-15 240 (BALLOONS) IMPLANT
BALLOON DILATOR CRE WIREGUIDE (BALLOONS) IMPLANT
BLOCK BITE 60FR ADLT L/F BLUE (MISCELLANEOUS) ×4 IMPLANT
ELECT REM PT RETURN 9FT ADLT (ELECTROSURGICAL)
ELECTRODE REM PT RTRN 9FT ADLT (ELECTROSURGICAL) IMPLANT
FCP BXJMBJMB 240X2.8X (CUTTING FORCEPS)
FLOOR PAD 36X40 (MISCELLANEOUS) ×4
FORCEP COLD BIOPSY (CUTTING FORCEPS) IMPLANT
FORCEP RJ3 GP 1.8X160 W-NEEDLE (CUTTING FORCEPS) IMPLANT
FORCEPS BIOP RAD 4 LRG CAP 4 (CUTTING FORCEPS) ×1 IMPLANT
FORCEPS BIOP RJ4 240 W/NDL (CUTTING FORCEPS)
FORCEPS BXJMBJMB 240X2.8X (CUTTING FORCEPS) IMPLANT
FORMALIN 10 PREFIL 20ML (MISCELLANEOUS) ×1 IMPLANT
INJECTOR/SNARE I SNARE (MISCELLANEOUS) IMPLANT
LUBRICANT JELLY 4.5OZ STERILE (MISCELLANEOUS) ×1 IMPLANT
MANIFOLD NEPTUNE II (INSTRUMENTS) ×4 IMPLANT
MANIFOLD NEPTUNE WASTE (CANNULA) ×1 IMPLANT
NDL SCLEROTHERAPY 25GX240 (NEEDLE) IMPLANT
NEEDLE SCLEROTHERAPY 25GX240 (NEEDLE) IMPLANT
PAD FLOOR 36X40 (MISCELLANEOUS) ×3 IMPLANT
PROBE APC STR FIRE (PROBE) IMPLANT
PROBE INJECTION GOLD (MISCELLANEOUS)
PROBE INJECTION GOLD 7FR (MISCELLANEOUS) IMPLANT
SNARE ROTATE MED OVAL 20MM (MISCELLANEOUS) IMPLANT
SYR 50ML LL SCALE MARK (SYRINGE) ×2 IMPLANT
SYR INFLATE BILIARY GAUGE (MISCELLANEOUS) IMPLANT
TRAP SPECIMEN MUCOUS 40CC (MISCELLANEOUS) IMPLANT
TUBING ENDO SMARTCAP PENTAX (MISCELLANEOUS) ×4 IMPLANT
TUBING IRRIGATION ENDOGATOR (MISCELLANEOUS) ×4 IMPLANT
WATER STERILE IRR 1000ML POUR (IV SOLUTION) ×1 IMPLANT

## 2012-12-31 NOTE — H&P (Addendum)
[name] is an 62 y.o. female.   Chief Complaint: Patient is here for EGD, ED and colonoscopy. HPI: Patient is 62 year old Caucasian female with multiple medical problems who was seen in the office 2 weeks ago for epigastric and left upper quadrant pain. She also complained of dysphagia. She was seen in emergency room a few weeks earlier and felt to have transverse colitis and treated with Augmentin. Since her office visit she has noted  decrease in her abdominal pain. She she has not taken meloxicam since that visit. Family history is positive for colon carcinoma in the mother and brother.  For the rest of the details please refer to my detailed note from 12/17/2012.  Past Medical History  Diagnosis Date  . Palpitations   . Tachycardia   . CAD (coronary artery disease)     Stent placement circumflex coronary 2007, catheterization 2008 patent stents. Normal LV function  . Chest pain   . Diabetes mellitus     Insulin dependent  . Dyslipidemia   . Anxiety and depression   . Diabetic polyneuropathy      severe on multiple medications  . 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 MI (myocardial infarction)     2007  . Anxiety   . Depression   . Obstructive sleep apnea     CPAP machine at night  . COPD (chronic obstructive pulmonary disease)   . GERD (gastroesophageal reflux disease)     Past Surgical History  Procedure Laterality Date  . Coronary angioplasty with stent placement    . Colonoscopy  08/06/2011    Procedure: COLONOSCOPY;  Surgeon: Rogene Houston, MD;  Location: [facility];  Service: Endoscopy;  Laterality: N/A;  215  . Arm surgery Bilateral     due to fall-pt broke both forearms    Family History  Problem Relation Age of Onset  . Cancer    . Coronary artery disease     Social History:  reports that she has never smoked. She has never used smokeless tobacco. She reports that she does not drink alcohol or use illicit drugs.  Allergies:  Allergies  Allergen Reactions  .  Nitrofuran Derivatives Itching and Swelling  . Amphetamine-Dextroamphetamine Swelling  . Pregabalin Swelling  . Topiramate Other (See Comments)    Tongue tingle  . Verelan [Verapamil] Rash    Medications Prior to Admission  Medication Sig Dispense Refill  . ALPRAZolam (XANAX) 1 MG tablet Take 1 mg by mouth 4 (four) times daily.        Marland Kitchen aspirin EC 81 MG tablet Take 81 mg by mouth daily.      Marland Kitchen atorvastatin (LIPITOR) 40 MG tablet Take 40 mg by mouth daily.      . benazepril (LOTENSIN) 20 MG tablet Take 1 tablet (20 mg total) by mouth daily.  30 tablet  6  . diphenoxylate-atropine (LOMOTIL) 2.5-0.025 MG per tablet Take 1 tablet by mouth 4 (four) times daily as needed for diarrhea or loose stools.      Marland Kitchen estrogens, conjugated, (PREMARIN) 1.25 MG tablet Take 1.25 mg by mouth daily.       Marland Kitchen gabapentin (NEURONTIN) 800 MG tablet Take 800 mg by mouth 3 (three) times daily.      . insulin glargine (LANTUS) 100 UNIT/ML injection Inject 60 Units into the skin 2 (two) times daily.       . insulin lispro (HUMALOG) 100 UNIT/ML injection Inject 2-10 Units into the skin 3 (three) times daily before meals.  Per patient this is now uses as a sliding scale.      . lidocaine (XYLOCAINE) 5 % ointment Apply 1 application topically at bedtime.       . meloxicam (MOBIC) 15 MG tablet Take 1 tablet (15 mg total) by mouth daily.  30 tablet  1  . methocarbamol (ROBAXIN) 500 MG tablet Take 1 tablet (500 mg total) by mouth 4 (four) times daily.  60 tablet  1  . metoprolol succinate (TOPROL-XL) 50 MG 24 hr tablet Take 1 tablet (50 mg total) by mouth daily. Take with or immediately following a meal.  30 tablet  6  . nitroGLYCERIN (NITROSTAT) 0.4 MG SL tablet Place 1 tablet (0.4 mg total) under the tongue every 5 (five) minutes x 3 doses as needed. For chest pains  25 tablet  3  . omeprazole (PRILOSEC) 20 MG capsule Take 20 mg by mouth 2 (two) times daily.        . ondansetron (ZOFRAN) 4 MG tablet Take 1 tablet (4 mg total)  by mouth every 6 (six) hours.  12 tablet  0  . oxymorphone (OPANA ER) 20 MG 12 hr tablet Take 1 tablet (20 mg total) by mouth every 12 (twelve) hours.  60 tablet  0  . peg 3350 powder (MOVIPREP) 100 G SOLR Take 1 kit (200 g total) by mouth once.  1 kit  0  . potassium chloride (K-DUR) 10 MEQ tablet Take 20 mEq by mouth 2 (two) times daily.       . rizatriptan (MAXALT-MLT) 5 MG disintegrating tablet Take 5 mg by mouth as needed for migraine. May repeat in 2 hours if needed      . topiramate (TOPAMAX) 200 MG tablet Take 200 mg by mouth daily.      Marland Kitchen torsemide (DEMADEX) 20 MG tablet Take 60 mg by mouth daily.       . traMADol (ULTRAM) 50 MG tablet Take 1 tablet (50 mg total) by mouth every 8 (eight) hours as needed for pain.  60 tablet  0    Results for orders placed during the hospital encounter of 12/31/12 (from the past 48 hour(s))  GLUCOSE, CAPILLARY     Status: Abnormal   Collection Time    12/31/12  6:36 AM      Result Value Range   Glucose-Capillary 165 (*) 70 - 99 mg/dL   No results found.  ROS  Pulse 108, temperature 98.2 F (36.8 C), temperature source Oral. Physical Exam  Constitutional: She appears well-developed and well-nourished.  HENT:  Mouth/Throat: Oropharynx is clear and moist.  Eyes: Conjunctivae are normal. No scleral icterus.  Neck: No thyromegaly present.  Cardiovascular: Normal rate, regular rhythm and normal heart sounds.   No murmur heard. Respiratory: Effort normal and breath sounds normal.  GI:  Protuberant abdomen which is soft with mild mid epigastric tenderness. No organomegaly or masses.  Musculoskeletal: She exhibits no edema.  Lymphadenopathy:    She has no cervical adenopathy.  Neurological: She is alert.  Skin: Skin is warm and dry.     Assessment/Plan Dysphagia. Epigastric and left upper quadrant abdominal pain. Recent history of transverse colitis. Family history of colon carcinoma in mother and a brother. EGD with ED. Diagnostic  colonoscopy.  REHMAN,NAJEEB U 12/31/2012, 7:26 AM

## 2012-12-31 NOTE — Transfer of Care (Signed)
Immediate Anesthesia Transfer of Care Note  Patient: [name]  Procedure(s) Performed: Procedure(s) (LRB): COLONOSCOPY WITH PROPOFOL (N/A) ESOPHAGOGASTRODUODENOSCOPY (EGD) WITH PROPOFOL (N/A) MALONEY DILATION (N/A)  Patient Location: PACU  Anesthesia Type: MAC  Level of Consciousness: awake  Airway & Oxygen Therapy: Patient Spontanous Breathing. Non rebreather  Post-op Assessment: Report given to PACU RN, Post -op Vital signs reviewed and stable and Patient moving all extremities  Post vital signs: Reviewed and stable  Complications: No apparent anesthesia complications

## 2012-12-31 NOTE — Anesthesia Procedure Notes (Signed)
Procedure Name: MAC Date/Time: 12/31/2012 7:40 AM Performed by: Franco Nones Pre-anesthesia Checklist: Patient identified, Emergency Drugs available, Suction available, Timeout performed and Patient being monitored Patient Re-evaluated:Patient Re-evaluated prior to inductionOxygen Delivery Method: Non-rebreather mask

## 2012-12-31 NOTE — Progress Notes (Signed)
Awake. Wants to go home. Passing air. Voices no c/o at this time. Wants to see fiance'.

## 2012-12-31 NOTE — Anesthesia Preprocedure Evaluation (Addendum)
Anesthesia Evaluation  Patient identified by MRN, date of birth, ID band Patient awake    Reviewed: Allergy & Precautions, H&P , NPO status , Patient's Chart, lab work & pertinent test results, reviewed documented beta blocker date and time   Airway Mallampati: II TM Distance: >3 FB Neck ROM: Full    Dental  (+) Partial Lower and Partial Upper   Pulmonary sleep apnea , COPD breath sounds clear to auscultation        Cardiovascular + angina (Rx'd w/ NTG) with exertion + CAD, + Past MI and + Cardiac Stents Rhythm:Regular Rate:Normal     Neuro/Psych PSYCHIATRIC DISORDERS Anxiety Depression    GI/Hepatic GERD-  Medicated and Controlled,  Endo/Other  diabetes, Poorly Controlled, Type 2, Insulin DependentMorbid obesity  Renal/GU      Musculoskeletal   Abdominal   Peds  Hematology   Anesthesia Other Findings   Reproductive/Obstetrics                           Anesthesia Physical Anesthesia Plan  ASA: III  Anesthesia Plan: MAC   Post-op Pain Management:    Induction: Intravenous  Airway Management Planned: Simple Face Mask  Additional Equipment:   Intra-op Plan:   Post-operative Plan:   Informed Consent: I have reviewed the patients History and Physical, chart, labs and discussed the procedure including the risks, benefits and alternatives for the proposed anesthesia with the patient or authorized representative who has indicated his/her understanding and acceptance.     Plan Discussed with:   Anesthesia Plan Comments:         Anesthesia Quick Evaluation

## 2012-12-31 NOTE — Op Note (Signed)
EGD PROCEDURE REPORT  PATIENT:  [name]  MR#:  [id number] Birthdate:  11-Jun-1950, 62 y.o., female Endoscopist:  Dr. Rogene Houston, MD Referred By:  Dr. Glenda Chroman, MD Procedure Date: 12/31/2012  Procedure:   EGD, ED & Colonoscopy  Indications:  Patient is 62 year old Caucasian female with multiple medical problems who presents with solid food dysphagia recurrent epigastric and left upper quadrant abdominal pain. She was evaluated in emergency room 5 weeks ago and noted to have changes of transverse colitis. She was empirically treated with Augmentin. She she reports no improvement in her upper abdominal pain. She has not experienced diarrhea or rectal bleeding. Patient's last colonoscopy was May 2013. Family history is positive for colon carcinoma in mother and one brother.           Informed Consent:  The risks, benefits, alternatives & imponderables which include, but are not limited to, bleeding, infection, perforation, drug reaction and potential missed lesion have been reviewed.  The potential for biopsy, lesion removal, esophageal dilation, etc. have also been discussed.  Questions have been answered.  All parties agreeable.  Please see history & physical in medical record for more information.  Medications:  Cetacaine spray topically for oropharyngeal anesthesia Monitored anesthesia care; please see anesthesia records for details.  EGD  Description of procedure:  The endoscope was introduced through the mouth and advanced to the second portion of the duodenum without difficulty or limitations. The mucosal surfaces were surveyed very carefully during advancement of the scope and upon withdrawal.  Findings:  Esophagus:  Mucosa of the esophagus was normal. GE junction was unremarkable without ring or stricture. GEJ:  37 cm Stomach:  Stomach was empty and distended very well with insufflation. Folds in the proximal stomach are unremarkable. Multiple polyps were noted in the  gastric body and fundus. These ranged in size from 4-10 mm. Antral mucosa was unremarkable. Pyloric channel was patent. Angularis was unremarkable. Mucosa and cardia was unremarkable. Duodenum:  Normal bulbar and post bulbar mucosa.  Therapeutic/Diagnostic Maneuvers Performed:   Esophagus was dilated by passing 54 and 56 [place] Maloney dilators for insertion. Esophageal mucosa was reexamined postovulation and no mucosal disruption noted. Biopsy was taken from 4 of gastric polyps and submitted together.  COLONOSCOPY Description of procedure:  After a digital rectal exam was performed, that colonoscope was advanced from the anus through the rectum and colon to the area of the cecum, ileocecal valve and appendiceal orifice. The cecum was deeply intubated. These structures were well-seen and photographed for the record. From the level of the cecum and ileocecal valve, the scope was slowly and cautiously withdrawn. The mucosal surfaces were carefully surveyed utilizing scope tip to flexion to facilitate fold flattening as needed. The scope was pulled down into the rectum where a thorough exam including retroflexion was performed.  Findings:   Prep excellent. Normal mucosa of cecum, hepatic flexure, transverse colon, splenic flexure, descending and sigmoid colon. Normal rectal mucosa. Small hemorrhoids below the dentate line.  Therapeutic/Diagnostic Maneuvers Performed:  None  Complications:  None  Cecal Withdrawal Time:  11 minutes  Impression:  EGD findings; No evidence of erosive esophagitis, ring or stricture. No evidence of peptic ulcer disease. Multiple polyps involving proximal part of the stomach. Appearance suggestive of hyperplastic polyps. Four of these were biopsied for routine histology. Esophagus dilated by passing 54 and 56 [place] Maloney dilators but no mucosal disruption induced.  Colonoscopy findings; Small external hemorrhoids otherwise normal colonoscopy. No evidence of  transverse  colitis (noted on CT of 5 weeks ago which means it has resolved).  Recommendations:  Patient advised to take meloxicam on an as-needed basis. Dicyclomine 10 mg by mouth 3 times a day when necessary. I will be contacting patient with biopsy results.  Raegan Winders U  12/31/2012 8:40 AM  CC: Dr. Glenda Chroman., MD & Dr. Rayne Du ref. provider found

## 2012-12-31 NOTE — Progress Notes (Signed)
Sips of diet ginger-ale given. Swallowing without difficulty. Tolerated well.

## 2012-12-31 NOTE — Anesthesia Postprocedure Evaluation (Signed)
Anesthesia Post Note  Patient: [name]  Procedure(s) Performed: Procedure(s) (LRB): COLONOSCOPY WITH PROPOFOL (N/A) ESOPHAGOGASTRODUODENOSCOPY (EGD) WITH PROPOFOL (N/A) MALONEY DILATION (N/A)  Anesthesia type: MAC  Patient location: PACU  Post pain: Pain level controlled  Post assessment: Post-op Vital signs reviewed, Patient's Cardiovascular Status Stable, Respiratory Function Stable, Patent Airway, No signs of Nausea or vomiting and Pain level controlled  Last Vitals:  Filed Vitals:   12/31/12 0834  BP: 87/41  Pulse: 91  Temp: 36.7 C  Resp: 20    Post vital signs: Reviewed and stable  Level of consciousness: awake and alert   Complications: No apparent anesthesia complications

## 2013-01-03 ENCOUNTER — Encounter (HOSPITAL_COMMUNITY): Payer: Self-pay | Admitting: Internal Medicine

## 2013-01-11 ENCOUNTER — Encounter (INDEPENDENT_AMBULATORY_CARE_PROVIDER_SITE_OTHER): Payer: Self-pay | Admitting: *Deleted

## 2013-01-17 ENCOUNTER — Encounter: Payer: Self-pay | Admitting: Physical Medicine & Rehabilitation

## 2013-01-17 ENCOUNTER — Encounter: Payer: Medicaid Other | Attending: Physical Medicine and Rehabilitation

## 2013-01-17 ENCOUNTER — Ambulatory Visit (HOSPITAL_BASED_OUTPATIENT_CLINIC_OR_DEPARTMENT_OTHER): Payer: Medicaid Other | Admitting: Physical Medicine & Rehabilitation

## 2013-01-17 VITALS — BP 134/58 | HR 84 | Resp 14 | Ht 66.0 in | Wt 271.2 lb

## 2013-01-17 DIAGNOSIS — Z79899 Other long term (current) drug therapy: Secondary | ICD-10-CM | POA: Insufficient documentation

## 2013-01-17 DIAGNOSIS — M48061 Spinal stenosis, lumbar region without neurogenic claudication: Secondary | ICD-10-CM | POA: Insufficient documentation

## 2013-01-17 DIAGNOSIS — M47817 Spondylosis without myelopathy or radiculopathy, lumbosacral region: Secondary | ICD-10-CM | POA: Insufficient documentation

## 2013-01-17 DIAGNOSIS — E1142 Type 2 diabetes mellitus with diabetic polyneuropathy: Secondary | ICD-10-CM

## 2013-01-17 DIAGNOSIS — Z5181 Encounter for therapeutic drug level monitoring: Secondary | ICD-10-CM

## 2013-01-17 DIAGNOSIS — Z794 Long term (current) use of insulin: Secondary | ICD-10-CM | POA: Insufficient documentation

## 2013-01-17 DIAGNOSIS — E1149 Type 2 diabetes mellitus with other diabetic neurological complication: Secondary | ICD-10-CM | POA: Insufficient documentation

## 2013-01-17 DIAGNOSIS — M6250 Muscle wasting and atrophy, not elsewhere classified, unspecified site: Secondary | ICD-10-CM | POA: Insufficient documentation

## 2013-01-17 MED ORDER — OXYMORPHONE HCL ER 30 MG PO TB12: 30.0000 mg | ORAL_TABLET | Freq: Two times a day (BID) | ORAL | Status: DC

## 2013-01-17 NOTE — Patient Instructions (Signed)
If pain is not much better by next visit consider sacroiliac injection or repeat MRI Recommend start  aquatic therapy Continue gabapentin for neuropathy pain

## 2013-01-17 NOTE — Progress Notes (Signed)
Subjective:    Patient ID: [name], female    DOB: 04-27-1950, 62 y.o.   MRN: [id number]  HPI Increased low back pain going into the right lower extremity. Also has pain in hands and feet. Will be starting aquatic therapy at the [facility]. Wants to discuss pain medications. Had good relief with morphine sulfate extended release 60 mg 3 times per day. Changed because of insurance reason Currently on Opana ER 20 mg twice a day Discussed that this is the same as morphine 60 mg twice a day Pain Inventory Average Pain 9 Pain Right Now 8 My pain is sharp, burning, stabbing, tingling and aching  In the last 24 hours, has pain interfered with the following? General activity 8 Relation with others 6 Enjoyment of life 9 What TIME of day is your pain at its worst? evening, night Sleep (in general) Poor  Pain is worse with: walking, bending, standing, some activites and other Pain improves with: heat/ice and medication Relief from Meds: 3  Mobility walk with assistance use a walker how many minutes can you walk? 5 ability to climb steps?  no do you drive?  no Do you have any goals in this area?  yes  Function disabled: date disabled na I need assistance with the following:  meal prep, household duties and shopping  Neuro/Psych bladder control problems bowel control problems weakness tingling dizziness depression anxiety  Prior Studies Any changes since last visit?  no  Physicians involved in your care Any changes since last visit?  no   Family History  Problem Relation Age of Onset  . Cancer    . Coronary artery disease     History   Social History  . Marital Status: Legally Separated    Spouse Name: N/A    Number of Children: N/A  . Years of Education: N/A   Occupational History  . Disabled    Social History Main Topics  . Smoking status: Never Smoker   . Smokeless tobacco: Never Used  . Alcohol Use: No  . Drug Use: No  . Sexual Activity: None    Other Topics Concern  . None   Social History Narrative   Patient does not get regular exercise   Past Surgical History  Procedure Laterality Date  . Coronary angioplasty with stent placement    . Colonoscopy  08/06/2011    Procedure: COLONOSCOPY;  Surgeon: Rogene Houston, MD;  [facility];  Service: Endoscopy;  Laterality: N/A;  215  . Arm surgery Bilateral     due to fall-pt broke both forearms  . Colonoscopy with propofol N/A 12/31/2012    Procedure: COLONOSCOPY WITH PROPOFOL;  Surgeon: Rogene Houston, MD;  Location: [facility];  Service: Endoscopy;  Laterality: N/A;  in cecum at 0814, total withdrawal time 81mn  . Esophagogastroduodenoscopy (egd) with propofol N/A 12/31/2012    Procedure: ESOPHAGOGASTRODUODENOSCOPY (EGD) WITH PROPOFOL;  Surgeon: NRogene Houston MD;  Location: [facility];  Service: Endoscopy;  Laterality: N/A;  GE junction 37,  . Maloney dilation N/A 12/31/2012    Procedure: MVenia MinksDILATION;  Surgeon: NRogene Houston MD;  Location: [facility];  Service: Endoscopy;  Laterality: N/A;  used a # 54,#56   Past Medical History  Diagnosis Date  . Palpitations   . Tachycardia   . CAD (coronary artery disease)     Stent placement circumflex coronary 2007, catheterization 2008 patent stents. Normal LV function  . Chest pain   . Diabetes mellitus  Insulin dependent  . Dyslipidemia   . Anxiety and depression   . Diabetic polyneuropathy      severe on multiple medications  . 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 MI (myocardial infarction)     2007  . Anxiety   . Depression   . Obstructive sleep apnea     CPAP machine at night  . COPD (chronic obstructive pulmonary disease)   . GERD (gastroesophageal reflux disease)    BP 134/58  Pulse 84  Resp 14  Ht 5' 6"  (1.676 m)  Wt 271 lb 3.2 oz (123.016 kg)  BMI 43.79 kg/m2  SpO2 95%      Review of Systems  Cardiovascular: Positive for leg swelling.  Gastrointestinal: Positive for nausea, abdominal pain, diarrhea  and constipation.  Genitourinary:       Bowel and bladder control problems  Neurological: Positive for dizziness and weakness.       Tingling  Psychiatric/Behavioral: Positive for dysphoric mood. The patient is nervous/anxious.   All other systems reviewed and are negative.       Objective:   Physical Exam  Nursing note and vitals reviewed. Constitutional: She is oriented to person, place, and time. She appears well-developed and well-nourished. No distress.  HENT:  Head: Normocephalic and atraumatic.  Eyes: Conjunctivae and EOM are normal. Pupils are equal, round, and reactive to light.  Neck: Normal range of motion.  Musculoskeletal:       Right shoulder: She exhibits tenderness, crepitus and pain. She exhibits no swelling.       Left shoulder: She exhibits decreased range of motion, tenderness, crepitus and pain. She exhibits no effusion and no spasm.       Right knee: She exhibits no effusion and no deformity. Tenderness found.       Left knee: She exhibits normal range of motion and no effusion. Tenderness found.       Lumbar back: She exhibits decreased range of motion and tenderness. She exhibits no swelling, no deformity and no spasm.  Neurological: She is alert and oriented to person, place, and time. She has normal strength and normal reflexes. No sensory deficit. Gait abnormal.  Ambulates slowly no foot drop  Psychiatric: She has a normal mood and affect.          Assessment & Plan:  . Lumbar pain now with increasing radicular discomfort.Not at baseline pain med dose.  Will increase Opana to 38m BID.  RTC 1 mo if no better order lumbar imaging studies.   Last MRI from 7 years ago showed no compressive lesions. Has a cardiac stents . See whether she needs to have a CT scan ordered or whether they can do the MRI   If there is evidence of nerve root impingement corresponding to symptoms on the right side may benefit from epidural injection

## 2013-01-18 ENCOUNTER — Telehealth: Payer: Self-pay

## 2013-01-18 NOTE — Telephone Encounter (Signed)
Patient called saying her medication needed a prior authorization and she has been out of pain medication since Saturday.

## 2013-01-18 NOTE — Telephone Encounter (Signed)
Prior authorization was faxed 01/18/2013 at 132pm.  Patient nephew aware.

## 2013-01-19 ENCOUNTER — Other Ambulatory Visit: Payer: Self-pay | Admitting: Physical Medicine and Rehabilitation

## 2013-01-20 ENCOUNTER — Other Ambulatory Visit: Payer: Self-pay

## 2013-01-20 NOTE — Telephone Encounter (Signed)
To karen

## 2013-01-20 NOTE — Telephone Encounter (Signed)
Ok to refill 

## 2013-01-24 ENCOUNTER — Telehealth: Payer: Self-pay

## 2013-01-24 NOTE — Telephone Encounter (Signed)
Message copied by Judd Gaudier on Mon Jan 24, 2013  3:20 PM ------      Message from: Su Monks      Created: Mon Jan 24, 2013 10:27 AM       Patient was in the ED shortly before this test, she got infusions for pancreatitis and severe abdominal pain, please also ask patient, for an explanation, but the treatment in the ED could be an explanation ------

## 2013-01-24 NOTE — Telephone Encounter (Signed)
Patient was seen in the hospital.  She had run out of her medication, she went through her old medication and found a small piece of morphine.  Advised patient not to take old medications.

## 2013-01-26 ENCOUNTER — Encounter: Payer: Self-pay | Admitting: Cardiovascular Disease

## 2013-02-01 ENCOUNTER — Ambulatory Visit (INDEPENDENT_AMBULATORY_CARE_PROVIDER_SITE_OTHER): Payer: Medicaid Other | Admitting: Cardiovascular Disease

## 2013-02-01 ENCOUNTER — Encounter: Payer: Self-pay | Admitting: Cardiovascular Disease

## 2013-02-01 VITALS — BP 99/66 | HR 99 | Ht 63.0 in | Wt 244.0 lb

## 2013-02-01 DIAGNOSIS — I10 Essential (primary) hypertension: Secondary | ICD-10-CM

## 2013-02-01 DIAGNOSIS — R110 Nausea: Secondary | ICD-10-CM

## 2013-02-01 DIAGNOSIS — I2510 Atherosclerotic heart disease of native coronary artery without angina pectoris: Secondary | ICD-10-CM

## 2013-02-01 DIAGNOSIS — R079 Chest pain, unspecified: Secondary | ICD-10-CM

## 2013-02-01 DIAGNOSIS — I5032 Chronic diastolic (congestive) heart failure: Secondary | ICD-10-CM

## 2013-02-01 DIAGNOSIS — R109 Unspecified abdominal pain: Secondary | ICD-10-CM

## 2013-02-01 DIAGNOSIS — E876 Hypokalemia: Secondary | ICD-10-CM

## 2013-02-01 DIAGNOSIS — K589 Irritable bowel syndrome without diarrhea: Secondary | ICD-10-CM

## 2013-02-01 DIAGNOSIS — R5383 Other fatigue: Secondary | ICD-10-CM

## 2013-02-01 DIAGNOSIS — M7989 Other specified soft tissue disorders: Secondary | ICD-10-CM

## 2013-02-01 DIAGNOSIS — R197 Diarrhea, unspecified: Secondary | ICD-10-CM

## 2013-02-01 DIAGNOSIS — R5381 Other malaise: Secondary | ICD-10-CM

## 2013-02-01 DIAGNOSIS — Z130 Encounter for screening for diseases of the blood and blood-forming organs and certain disorders involving the immune mechanism: Secondary | ICD-10-CM

## 2013-02-01 NOTE — Patient Instructions (Addendum)
Your physician recommends that you schedule a follow-up appointment in: 1 week with Dr. Purvis Sheffield in [place] office.  Your physician has recommended you make the following change in your medication:  Hold Benazepril - until further notice  Continue all other medications the same.   Your physician recommends that you return for lab work in: today for BMET and CBC.  You can go to the following locations to get lab work done: [facility] [address] Dr. Lysbeth Galas office in [place] [facility].   Your physician has requested that you have an echocardiogram. Echocardiography is a painless test that uses sound waves to create images of your heart. It provides your doctor with information about the size and shape of your heart and how well your heart's chambers and valves are working. This procedure takes approximately one hour. There are no restrictions for this procedure.

## 2013-02-01 NOTE — Progress Notes (Signed)
Patient ID: [name], female   DOB: [birth year], 62 y.o.   MRN: [id number]       SUBJECTIVE: Mrs. Millikin has a h/o an MI in 2005-05-18 with PCI of the LCx. She also has IDDM, sleep apnea, and dyslipidemia.  She had a repeat cardiac catheterization May 18, 2006 which showed mild nonobstructive disease of the circumflex and right coronary artery. Her ejection fraction was normal at 65%. She had been subsequently admitted to [facility] and ruled out for myocardial infarction. Echocardiogram at that time was essentially within normal limits.   She also has a history of anxiety and depression as well as a long-standing history of diabetes mellitus with severe diabetic peripheral neuropathy.   She was recently admitted on 01/26/2013 to [facility] for chest pain, weakness and leg swelling. She was also noted to have lower extremity edema. It was thought that she may have had chest wall pain, being musculoskeletal in etiology. She ruled out for an acute coronary syndrome with normal troponins. She was noted to be hypokalemic with a potassium of 3 and this was repleted. She was also given intravenous furosemide and felt much better. She reportedly had a CPK drawn prior to discharge which came back at 900+, but she had a negative CK-MB and troponin. The lower extremity was felt secondary to medications and possibly pulmonary hypertension.  Since she was discharged, she has not been bothered by chest pain. However, she has been having abdominal pain and diarrhea. She denies fevers, chills, and sick contacts, albeit she was recently admitted to the hospital. She c/o fatigue and depression, and says this all began one week ago prior to her hospitalization. She had an EGD on 12-31-12 which revealed stomach polyps and she also underwent esophageal dilatation for solid food dysphagia. Her colonoscopy showed resolution of transverse colitis reportedly seen by CT five weeks prior to this  procedure.     She has lumbar spinal stenosis and a lot of chronic pain related to it.  She had a son who passed away from [place] in 05/19/95.     Allergies  Allergen Reactions  . Nitrofuran Derivatives Itching and Swelling  . Amphetamine-Dextroamphetamine Swelling  . Pregabalin Swelling  . Topiramate Other (See Comments)    Tongue tingle  . Verelan [Verapamil] Rash    Current Outpatient Prescriptions  Medication Sig Dispense Refill  . ALPRAZolam (XANAX) 1 MG tablet Take 1 mg by mouth 4 (four) times daily.        Marland Kitchen aspirin EC 81 MG tablet Take 81 mg by mouth daily.      Marland Kitchen atorvastatin (LIPITOR) 40 MG tablet Take 40 mg by mouth daily.      . benazepril (LOTENSIN) 20 MG tablet Take 1 tablet (20 mg total) by mouth daily.  30 tablet  6  . dicyclomine (BENTYL) 10 MG capsule Take 10 mg by mouth 2 (two) times daily.      . diphenoxylate-atropine (LOMOTIL) 2.5-0.025 MG per tablet Take 1 tablet by mouth 4 (four) times daily as needed for diarrhea or loose stools.      Marland Kitchen estrogens, conjugated, (PREMARIN) 1.25 MG tablet Take 1.25 mg by mouth daily.       . insulin glargine (LANTUS) 100 UNIT/ML injection Inject 60 Units into the skin 2 (two) times daily.       . insulin lispro (HUMALOG) 100 UNIT/ML injection Inject 2-10 Units into the skin 3 (three) times daily before meals. Per patient this is now uses as a  sliding scale.      . lidocaine (XYLOCAINE) 5 % ointment Apply 1 application topically at bedtime.       . meloxicam (MOBIC) 15 MG tablet Take 1 tablet (15 mg total) by mouth daily as needed for pain.  30 tablet  1  . methocarbamol (ROBAXIN) 500 MG tablet TAKE 1 TABLET BY MOUTH FOUR TIMES DAILY  60 tablet  1  . metoprolol succinate (TOPROL-XL) 50 MG 24 hr tablet Take 1 tablet (50 mg total) by mouth daily. Take with or immediately following a meal.  30 tablet  6  . nitroGLYCERIN (NITROSTAT) 0.4 MG SL tablet Place 1 tablet (0.4 mg total) under the tongue every 5 (five) minutes x 3 doses as  needed. For chest pains  25 tablet  3  . omeprazole (PRILOSEC) 20 MG capsule Take 20 mg by mouth 2 (two) times daily.        . ondansetron (ZOFRAN) 4 MG tablet Take 1 tablet (4 mg total) by mouth every 6 (six) hours.  12 tablet  0  . oxymorphone (OPANA ER) 30 MG 12 hr tablet Take 1 tablet (30 mg total) by mouth every 12 (twelve) hours.  60 tablet  0  . potassium chloride (K-DUR) 10 MEQ tablet Take 10 mEq by mouth 3 (three) times daily.       . rizatriptan (MAXALT-MLT) 5 MG disintegrating tablet Take 5 mg by mouth as needed for migraine. May repeat in 2 hours if needed      . topiramate (TOPAMAX) 200 MG tablet Take 200 mg by mouth daily.      Marland Kitchen torsemide (DEMADEX) 20 MG tablet Take 20 mg by mouth 2 (two) times daily.       . [DISCONTINUED] diltiazem (TIAZAC) 180 MG 24 hr capsule Take 1 capsule (180 mg total) by mouth daily.  30 capsule  1   No current facility-administered medications for this visit.    Past Medical History  Diagnosis Date  . Palpitations   . Tachycardia   . CAD (coronary artery disease)     Stent placement circumflex coronary 2007, catheterization 2008 patent stents. Normal LV function  . Chest pain   . Diabetes mellitus     Insulin dependent  . Dyslipidemia   . Anxiety and depression   . Diabetic polyneuropathy      severe on multiple medications  . 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 MI (myocardial infarction)     2007  . Anxiety   . Depression   . Obstructive sleep apnea     CPAP machine at night  . COPD (chronic obstructive pulmonary disease)   . GERD (gastroesophageal reflux disease)     Past Surgical History  Procedure Laterality Date  . Coronary angioplasty with stent placement    . Colonoscopy  08/06/2011    Procedure: COLONOSCOPY;  Surgeon: Rogene Houston, MD;  Location: [facility];  Service: Endoscopy;  Laterality: N/A;  215  . Arm surgery Bilateral     due to fall-pt broke both forearms  . Colonoscopy with propofol N/A 12/31/2012    Procedure:  COLONOSCOPY WITH PROPOFOL;  Surgeon: Rogene Houston, MD;  Location: [facility];  Service: Endoscopy;  Laterality: N/A;  in cecum at 0814, total withdrawal time 71mn  . Esophagogastroduodenoscopy (egd) with propofol N/A 12/31/2012    Procedure: ESOPHAGOGASTRODUODENOSCOPY (EGD) WITH PROPOFOL;  Surgeon: NRogene Houston MD;  Location: [facility];  Service: Endoscopy;  Laterality: N/A;  GE junction 37,  . MVenia Minks  dilation N/A 12/31/2012    Procedure: Venia Minks DILATION;  Surgeon: Rogene Houston, MD;  Location: [facility] ORS;  Service: Endoscopy;  Laterality: N/A;  used a # 54,#56    History   Social History  . Marital Status: Legally Separated    Spouse Name: N/A    Number of Children: N/A  . Years of Education: N/A   Occupational History  . Disabled    Social History Main Topics  . Smoking status: Never Smoker   . Smokeless tobacco: Never Used  . Alcohol Use: No  . Drug Use: No  . Sexual Activity: Not on file   Other Topics Concern  . Not on file   Social History Narrative   Patient does not get regular exercise     Filed Vitals:   02/01/13 1527  Height: 5' 3"  (1.6 m)  Weight: 244 lb (110.678 kg)    PHYSICAL EXAM General: NAD Neck: No JVD, no thyromegaly or thyroid nodule.  Lungs: Clear to auscultation bilaterally with normal respiratory effort. CV: Nondisplaced PMI.  Heart regular S1/S2, no S3/S4, no murmur.  Trace/minimal peripheral edema, with wrinkled skin appearance in bilateral pretibial regions.  No carotid bruit.  Normal pedal pulses.  Abdomen: Soft, mild tenderness in the epigastric region, no hepatosplenomegaly, no distention.  Neurologic: Alert and oriented x 3.  Psych: Flat affect. Extremities: No clubbing or cyanosis.   ECG: reviewed and available in electronic records.      ASSESSMENT AND PLAN: 1. CAD: she is currently asymptomatic, and remains on ASA and Lipitor along with Toprol-XL which I prescribed at her last visit in 10/2012 (for tachy-palps and anti-anginal  benefit). I will obtain an echocardiogram to assess for an interval change in systolic function or new regional wall motion abnormalities, given her recent hospitalization for chest pain and leg swelling. I would consider stress testing but will await the echo results. 2. HTN: slightly hypotensive on benazepril, in the setting of diarrhea and fatigue. I will hold this for now to see if this helps improve her symptoms. 3. Leg swelling: resolved and remains on her maintenance dose of torsemide 20 mg bid along with KCl 10 meq tid. I will obtain an echocardiogram to assess for the etiology (systolic vs diastolic dysfunction). 4. Weakness, fatigue, and nausea: she was markedly hypokalemic during her hospitalization (K of 3), and she is currently experiencing diarrhea and is also on torsemide. I will check a BMET to assess her electrolyte status, and a CBC to assess for infection (leukocytosis) and anemia (Hgb). No striking abnormalities by colonoscopy in 12/2012.  Dispo: f/u next week.    Kate Sable, M.D., F.A.C.C.

## 2013-02-03 ENCOUNTER — Other Ambulatory Visit: Payer: Medicaid Other

## 2013-02-07 ENCOUNTER — Other Ambulatory Visit (INDEPENDENT_AMBULATORY_CARE_PROVIDER_SITE_OTHER): Payer: Medicaid Other

## 2013-02-07 ENCOUNTER — Other Ambulatory Visit: Payer: Self-pay

## 2013-02-07 DIAGNOSIS — R079 Chest pain, unspecified: Secondary | ICD-10-CM

## 2013-02-07 DIAGNOSIS — I059 Rheumatic mitral valve disease, unspecified: Secondary | ICD-10-CM

## 2013-02-08 ENCOUNTER — Telehealth: Payer: Self-pay | Admitting: *Deleted

## 2013-02-08 DIAGNOSIS — E876 Hypokalemia: Secondary | ICD-10-CM

## 2013-02-08 MED ORDER — POTASSIUM CHLORIDE ER 10 MEQ PO TBCR: 20.0000 meq | EXTENDED_RELEASE_TABLET | Freq: Two times a day (BID) | ORAL | Status: DC

## 2013-02-08 NOTE — Telephone Encounter (Signed)
Message copied by Lesle Chris on Tue Feb 08, 2013  9:54 AM ------      Message from: Prentice Docker A      Created: Mon Feb 07, 2013  9:33 AM       Renal function is stable. Please have her increase KCL to 20 meq bid, and have K repeated in two weeks. It is currently low normal. ------

## 2013-02-08 NOTE — Telephone Encounter (Signed)
Notes Recorded by Laurine Blazer, LPN on 88/89/1694 at 9:54 AM Patient notified. Correction - OV is tomorrow (02/09/13). Will mail lab order to patient, she will do at her PMD office.  Notes Recorded by Laurine Blazer, LPN on 50/38/8828 at 9:42 AM Left message to return call. Has follow up OV today with Dr. Bronson Ing [place] office).

## 2013-02-09 ENCOUNTER — Ambulatory Visit (INDEPENDENT_AMBULATORY_CARE_PROVIDER_SITE_OTHER): Payer: Medicaid Other | Admitting: Cardiovascular Disease

## 2013-02-09 ENCOUNTER — Encounter: Payer: Self-pay | Admitting: Cardiovascular Disease

## 2013-02-09 ENCOUNTER — Telehealth: Payer: Self-pay | Admitting: *Deleted

## 2013-02-09 ENCOUNTER — Other Ambulatory Visit: Payer: Medicaid Other

## 2013-02-09 VITALS — BP 130/75 | HR 88 | Ht 66.0 in | Wt 268.0 lb

## 2013-02-09 DIAGNOSIS — M7989 Other specified soft tissue disorders: Secondary | ICD-10-CM

## 2013-02-09 DIAGNOSIS — E876 Hypokalemia: Secondary | ICD-10-CM

## 2013-02-09 DIAGNOSIS — I5032 Chronic diastolic (congestive) heart failure: Secondary | ICD-10-CM

## 2013-02-09 DIAGNOSIS — R5383 Other fatigue: Secondary | ICD-10-CM

## 2013-02-09 DIAGNOSIS — R110 Nausea: Secondary | ICD-10-CM

## 2013-02-09 DIAGNOSIS — R197 Diarrhea, unspecified: Secondary | ICD-10-CM

## 2013-02-09 DIAGNOSIS — R5381 Other malaise: Secondary | ICD-10-CM

## 2013-02-09 DIAGNOSIS — I2510 Atherosclerotic heart disease of native coronary artery without angina pectoris: Secondary | ICD-10-CM

## 2013-02-09 DIAGNOSIS — I10 Essential (primary) hypertension: Secondary | ICD-10-CM

## 2013-02-09 NOTE — Telephone Encounter (Signed)
Message copied by Lesle Chris on Wed Feb 09, 2013  3:13 PM ------      Message from: Prentice Docker A      Created: Tue Feb 08, 2013  1:04 PM       No change in Rx. ------

## 2013-02-09 NOTE — Telephone Encounter (Signed)
Notes Recorded by Laurine Blazer, LPN on 32/34/6887 at 3:13 PM Patient notified.

## 2013-02-09 NOTE — Patient Instructions (Addendum)
Your physician recommends that you schedule a follow-up appointment in: 4 months with Dr Reggy Eye will receive a reminder letter two months in advance reminding you to call and schedule your appointment. If you don't receive this letter, please contact our office.  Your physician has recommended you make the following change in your medication:  PLEASE HOLD BENAZEPRIL AND TORESMIDE

## 2013-02-09 NOTE — Progress Notes (Signed)
Patient ID: [name], female   DOB: 22-Feb-1951, 62 y.o.   MRN: [id number]      SUBJECTIVE:Mrs. Bayliss has a h/o an MI in 19-May-2005 with PCI of the LCx. She also has IDDM, sleep apnea, and dyslipidemia.  She had a repeat cardiac catheterization 05-19-2006 which showed mild nonobstructive disease of the circumflex and right coronary artery. Her ejection fraction was normal at 65%. She had been subsequently admitted to [facility] and ruled out for myocardial infarction. Echocardiogram at that time was essentially within normal limits.  She also has a history of anxiety and depression as well as a long-standing history of diabetes mellitus with severe diabetic peripheral neuropathy.   She was recently admitted on 01/26/2013 to [facility] for chest pain, weakness and leg swelling. She was also noted to have lower extremity edema. It was thought that she may have had chest wall pain, being musculoskeletal in etiology. She ruled out for an acute coronary syndrome with normal troponins. She was noted to be hypokalemic with a potassium of 3 and this was repleted. She was also given intravenous furosemide and felt much better. She reportedly had a CPK drawn prior to discharge which came back at 900+, but she had a negative CK-MB and troponin. The lower extremity was felt secondary to medications and possibly pulmonary hypertension.  Since she was discharged, she has not been bothered by chest pain. However, she has been having abdominal pain and diarrhea. She denies fevers, chills, and sick contacts, albeit she was recently admitted to the hospital. She c/o fatigue and depression, and says this all began one week ago prior to her hospitalization. She had an EGD on 12-31-12 which revealed stomach polyps and she also underwent esophageal dilatation for solid food dysphagia. Her colonoscopy showed resolution of transverse colitis reportedly seen by CT five weeks prior to this procedure.  She has  lumbar spinal stenosis and a lot of chronic pain related to it.  She had a son who passed away from [place] in 1995-05-20.  Her K was low normal at 3.6. Her CBC was normal. Her echocardiogram showed normal LV systolic function, EF 81-44%, grade I diastolic dysfunction, and mild RV dilatation. I asked her to hold her benazepril at her last visit, and she feels her fatigue has improved to some degree and her BP has elevated. However, she continues to have diarrhea four times daily. She continues to deny chest pain, and her leg swelling appears controlled with compression stockings. She feels dehydrated.    Allergies  Allergen Reactions  . Nitrofuran Derivatives Itching and Swelling  . Amphetamine-Dextroamphetamine Swelling  . Pregabalin Swelling  . Topiramate Other (See Comments)    Tongue tingle  . Verelan [Verapamil] Rash    Current Outpatient Prescriptions  Medication Sig Dispense Refill  . ALPRAZolam (XANAX) 1 MG tablet Take 1 mg by mouth 4 (four) times daily.        Marland Kitchen aspirin EC 81 MG tablet Take 81 mg by mouth daily.      Marland Kitchen atorvastatin (LIPITOR) 40 MG tablet Take 40 mg by mouth daily.      . benazepril (LOTENSIN) 20 MG tablet Take 1 tablet (20 mg total) by mouth daily.  30 tablet  6  . dicyclomine (BENTYL) 10 MG capsule Take 10 mg by mouth 2 (two) times daily.      . diphenoxylate-atropine (LOMOTIL) 2.5-0.025 MG per tablet Take 1 tablet by mouth 4 (four) times daily as needed for diarrhea or loose  stools.      Marland Kitchen estrogens, conjugated, (PREMARIN) 1.25 MG tablet Take 1.25 mg by mouth daily.       . insulin glargine (LANTUS) 100 UNIT/ML injection Inject 60 Units into the skin 2 (two) times daily.       . insulin lispro (HUMALOG) 100 UNIT/ML injection Inject 2-10 Units into the skin 3 (three) times daily before meals. Per patient this is now uses as a sliding scale.      . lidocaine (XYLOCAINE) 5 % ointment Apply 1 application topically at bedtime.       . meloxicam (MOBIC) 15 MG tablet Take  1 tablet (15 mg total) by mouth daily as needed for pain.  30 tablet  1  . methocarbamol (ROBAXIN) 500 MG tablet TAKE 1 TABLET BY MOUTH FOUR TIMES DAILY  60 tablet  1  . metoprolol succinate (TOPROL-XL) 50 MG 24 hr tablet Take 1 tablet (50 mg total) by mouth daily. Take with or immediately following a meal.  30 tablet  6  . nitroGLYCERIN (NITROSTAT) 0.4 MG SL tablet Place 1 tablet (0.4 mg total) under the tongue every 5 (five) minutes x 3 doses as needed. For chest pains  25 tablet  3  . omeprazole (PRILOSEC) 20 MG capsule Take 20 mg by mouth 2 (two) times daily.        . ondansetron (ZOFRAN) 4 MG tablet Take 1 tablet (4 mg total) by mouth every 6 (six) hours.  12 tablet  0  . oxymorphone (OPANA ER) 30 MG 12 hr tablet Take 1 tablet (30 mg total) by mouth every 12 (twelve) hours.  60 tablet  0  . potassium chloride (K-DUR) 10 MEQ tablet Take 2 tablets (20 mEq total) by mouth 2 (two) times daily.      . rizatriptan (MAXALT-MLT) 5 MG disintegrating tablet Take 5 mg by mouth as needed for migraine. May repeat in 2 hours if needed      . topiramate (TOPAMAX) 200 MG tablet Take 200 mg by mouth daily.      Marland Kitchen torsemide (DEMADEX) 20 MG tablet Take 20 mg by mouth 2 (two) times daily.       . [DISCONTINUED] diltiazem (TIAZAC) 180 MG 24 hr capsule Take 1 capsule (180 mg total) by mouth daily.  30 capsule  1   No current facility-administered medications for this visit.    Past Medical History  Diagnosis Date  . Palpitations   . Tachycardia   . CAD (coronary artery disease)     Stent placement circumflex coronary 2007, catheterization 2008 patent stents. Normal LV function  . Chest pain   . Diabetes mellitus     Insulin dependent  . Dyslipidemia   . Anxiety and depression   . Diabetic polyneuropathy      severe on multiple medications  . 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 MI (myocardial infarction)     2007  . Anxiety   . Depression   . Obstructive sleep apnea     CPAP machine at night  . COPD (chronic  obstructive pulmonary disease)   . GERD (gastroesophageal reflux disease)     Past Surgical History  Procedure Laterality Date  . Coronary angioplasty with stent placement    . Colonoscopy  08/06/2011    Procedure: COLONOSCOPY;  Surgeon: Rogene Houston, MD;  [facility]: [facility];  Service: Endoscopy;  Laterality: N/A;  215  . Arm surgery Bilateral     due to fall-pt broke both forearms  .  Colonoscopy with propofol N/A 12/31/2012    Procedure: COLONOSCOPY WITH PROPOFOL;  Surgeon: Rogene Houston, MD;  Location: [facility];  Service: Endoscopy;  Laterality: N/A;  in cecum at 0814, total withdrawal time 91mn  . Esophagogastroduodenoscopy (egd) with propofol N/A 12/31/2012    Procedure: ESOPHAGOGASTRODUODENOSCOPY (EGD) WITH PROPOFOL;  Surgeon: NRogene Houston MD;  Location: [facility];  Service: Endoscopy;  Laterality: N/A;  GE junction 37,  . Maloney dilation N/A 12/31/2012    Procedure: MVenia MinksDILATION;  Surgeon: NRogene Houston MD;  Location: [facility];  Service: Endoscopy;  Laterality: N/A;  used a # 54,#56    History   Social History  . Marital Status: Legally Separated    Spouse Name: N/A    Number of Children: N/A  . Years of Education: N/A   Occupational History  . Disabled    Social History Main Topics  . Smoking status: Never Smoker   . Smokeless tobacco: Never Used  . Alcohol Use: No  . Drug Use: No  . Sexual Activity: Not on file   Other Topics Concern  . Not on file   Social History Narrative   Patient does not get regular exercise     Filed Vitals:   02/09/13 1312  BP: 130/75  Pulse: 88  Height: 5' 6"  (1.676 m)  Weight: 268 lb (121.564 kg)    PHYSICAL EXAM General: NAD Neck: No JVD, no thyromegaly or thyroid nodule.  Lungs: Clear to auscultation bilaterally with normal respiratory effort. CV: Nondisplaced PMI.  Heart regular S1/S2, no S3/S4, no murmur.  Compression stockings on with trace peripheral edema.  No carotid bruit.  Normal pedal pulses.    Abdomen: Soft, nontender, no hepatosplenomegaly, no distention.  Neurologic: Alert and oriented x 3.  Psych: Normal affect. Extremities: No clubbing or cyanosis.   ECG: reviewed and available in electronic records.      ASSESSMENT AND PLAN: 1. CAD: LV systolic function is normal and without regional wall motion abnormalities. She is currently asymptomatic, and remains on ASA and Lipitor along with Toprol-XL which I prescribed at her visit in 10/2012 (for tachy-palps and anti-anginal benefit). I would consider stress testing in the future.  2. HTN: had been slightly hypotensive on benazepril, in the setting of diarrhea and fatigue, which I held at her last visit and this has improved. I will continue to hold this. 3. Leg swelling: this has resolved. However, she is dehydrated from recalcitrant diarrhea. I will hold torsemide and continue to have her wear compression stockings. I instructed her to take torsemide should she have significant leg swelling, and to inform me of this. 4. Weakness, fatigue, and nausea: she was markedly hypokalemic during her hospitalization (K of 3), and she is currently experiencing diarrhea and is also on torsemide. I'm holding torsemide as per #3. Her K was low normal and I increased KCl to 20 meq bid. No striking abnormalities by colonoscopy in 12/2012. I wonder if she's experiencing an adverse reaction to a medication. Will see if holding torsemide helps.    SKate Sable M.D., F.A.C.C.

## 2013-02-17 ENCOUNTER — Encounter: Payer: Self-pay | Admitting: Physical Medicine & Rehabilitation

## 2013-02-17 ENCOUNTER — Ambulatory Visit (HOSPITAL_BASED_OUTPATIENT_CLINIC_OR_DEPARTMENT_OTHER): Payer: Medicaid Other | Admitting: Physical Medicine & Rehabilitation

## 2013-02-17 ENCOUNTER — Encounter: Payer: Medicaid Other | Attending: Physical Medicine and Rehabilitation

## 2013-02-17 VITALS — BP 107/58 | HR 73 | Resp 16 | Ht 66.0 in | Wt 248.0 lb

## 2013-02-17 DIAGNOSIS — Z794 Long term (current) use of insulin: Secondary | ICD-10-CM | POA: Insufficient documentation

## 2013-02-17 DIAGNOSIS — M47817 Spondylosis without myelopathy or radiculopathy, lumbosacral region: Secondary | ICD-10-CM | POA: Insufficient documentation

## 2013-02-17 DIAGNOSIS — M6250 Muscle wasting and atrophy, not elsewhere classified, unspecified site: Secondary | ICD-10-CM | POA: Insufficient documentation

## 2013-02-17 DIAGNOSIS — M48061 Spinal stenosis, lumbar region without neurogenic claudication: Secondary | ICD-10-CM

## 2013-02-17 DIAGNOSIS — F341 Dysthymic disorder: Secondary | ICD-10-CM

## 2013-02-17 DIAGNOSIS — M6281 Muscle weakness (generalized): Secondary | ICD-10-CM

## 2013-02-17 DIAGNOSIS — Z79899 Other long term (current) drug therapy: Secondary | ICD-10-CM | POA: Insufficient documentation

## 2013-02-17 DIAGNOSIS — F329 Major depressive disorder, single episode, unspecified: Secondary | ICD-10-CM

## 2013-02-17 DIAGNOSIS — F32A Depression, unspecified: Secondary | ICD-10-CM

## 2013-02-17 DIAGNOSIS — E1142 Type 2 diabetes mellitus with diabetic polyneuropathy: Secondary | ICD-10-CM | POA: Insufficient documentation

## 2013-02-17 DIAGNOSIS — E1149 Type 2 diabetes mellitus with other diabetic neurological complication: Secondary | ICD-10-CM | POA: Insufficient documentation

## 2013-02-17 MED ORDER — OXYMORPHONE HCL ER 30 MG PO TB12: 30.0000 mg | ORAL_TABLET | Freq: Two times a day (BID) | ORAL | Status: DC

## 2013-02-17 NOTE — Progress Notes (Signed)
Subjective:    Patient ID: [name], female    DOB: 01-10-1951, 62 y.o.   MRN: [id number]  HPI Has had a fungal infection throat area per her report. Also has had diarrhea.  Has been fatigued low blood pressure, hypokalemia. Medications adjusted by cardiologist.  Increased low back pain going into the right lower extremity. Repeat MRI no significant compressive lesions. Will be starting aquatic therapy at the [facility] when she gets over her diarrhea.  Pain Inventory Average Pain 8 Pain Right Now 8 My pain is sharp, burning, stabbing, tingling and aching  In the last 24 hours, has pain interfered with the following? General activity 7 Relation with others 6 Enjoyment of life 8 What TIME of day is your pain at its worst? night Sleep (in general) Poor  Pain is worse with: walking, bending, standing and some activites Pain improves with: heat/ice and medication Relief from Meds: 4  Mobility use a cane how many minutes can you walk? 10 do you drive?  yes Do you have any goals in this area?  yes  Function disabled: date disabled . I need assistance with the following:  meal prep, household duties and shopping Do you have any goals in this area?  yes  Neuro/Psych weakness tingling dizziness depression anxiety  Prior Studies x-rays  Physicians involved in your care Any changes since last visit?  no   Family History  Problem Relation Age of Onset  . Cancer    . Coronary artery disease     History   Social History  . Marital Status: Legally Separated    Spouse Name: N/A    Number of Children: N/A  . Years of Education: N/A   Occupational History  . Disabled    Social History Main Topics  . Smoking status: Never Smoker   . Smokeless tobacco: Never Used  . Alcohol Use: No  . Drug Use: No  . Sexual Activity: None   Other Topics Concern  . None   Social History Narrative   Patient does not get regular exercise   Past Surgical History    Procedure Laterality Date  . Coronary angioplasty with stent placement    . Colonoscopy  08/06/2011    Procedure: COLONOSCOPY;  Surgeon: Rogene Houston, MD;  Location: [facility];  Service: Endoscopy;  Laterality: N/A;  215  . Arm surgery Bilateral     due to fall-pt broke both forearms  . Colonoscopy with propofol N/A 12/31/2012    Procedure: COLONOSCOPY WITH PROPOFOL;  Surgeon: Rogene Houston, MD;  Location: [facility];  Service: Endoscopy;  Laterality: N/A;  in cecum at 0814, total withdrawal time 78mn  . Esophagogastroduodenoscopy (egd) with propofol N/A 12/31/2012    Procedure: ESOPHAGOGASTRODUODENOSCOPY (EGD) WITH PROPOFOL;  Surgeon: NRogene Houston MD;  Location: [facility];  Service: Endoscopy;  Laterality: N/A;  GE junction 37,  . Maloney dilation N/A 12/31/2012    Procedure: MVenia MinksDILATION;  Surgeon: NRogene Houston MD;  Location: [facility];  Service: Endoscopy;  Laterality: N/A;  used a # 54,#56   Past Medical History  Diagnosis Date  . Palpitations   . Tachycardia   . CAD (coronary artery disease)     Stent placement circumflex coronary 2007, catheterization 2008 patent stents. Normal LV function  . Chest pain   . Diabetes mellitus     Insulin dependent  . Dyslipidemia   . Anxiety and depression   . Diabetic polyneuropathy  severe on multiple medications  . 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 MI (myocardial infarction)     2007  . Anxiety   . Depression   . Obstructive sleep apnea     CPAP machine at night  . COPD (chronic obstructive pulmonary disease)   . GERD (gastroesophageal reflux disease)    BP 107/58  Pulse 73  Resp 16  Ht 5' 6"  (1.676 m)  Wt 248 lb (112.492 kg)  BMI 40.05 kg/m2  SpO2 95%     Review of Systems  Gastrointestinal: Positive for nausea, vomiting, abdominal pain and diarrhea.  Musculoskeletal: Positive for back pain.  Neurological: Positive for dizziness and weakness.  Psychiatric/Behavioral: Positive for dysphoric mood. The patient is  nervous/anxious.   All other systems reviewed and are negative.       Objective:   Physical Exam  Nursing note and vitals reviewed. Constitutional: She is oriented to person, place, and time. She appears well-developed.  HENT:  Head: Normocephalic.  Eyes: Conjunctivae and EOM are normal. Pupils are equal, round, and reactive to light.  Neck: Normal range of motion.  Musculoskeletal:       Lumbar back: She exhibits decreased range of motion. She exhibits no tenderness, no edema and no deformity.  Negative straight leg raising test  Neurological: She is alert and oriented to person, place, and time. She has normal strength. Gait abnormal.  In place with a rolling walker. Standing balance is fair  Psychiatric: She is slowed. She exhibits a depressed mood.          Assessment & Plan:  Lumbar pain lumbar spondylosis which did not respond to medial branch blocks. Has no significant lumbar spinal stenosis. Back pain exacerbated by decreased activity secondary to recent medical illnesses. Opana to 86m BID. Last MRI from 7 years ago showed no compressive lesions. Has a cardiac stents . See whether she needs to have a CT scan ordered or whether they can do the MRI  No need for epidural given no evidence of nerve root impingement.  Return to clinic one month nursing visit

## 2013-02-17 NOTE — Patient Instructions (Signed)
You are on a high dose opiate, I do not recommend increasing the dose.   Once your diarrhea improves, please start aquatic exercise program  Once you become more active with exercise, we may be able to reduce the opiate dose to moderate levels which is safer for you.

## 2013-02-22 ENCOUNTER — Ambulatory Visit (INDEPENDENT_AMBULATORY_CARE_PROVIDER_SITE_OTHER): Payer: Medicaid Other | Admitting: Internal Medicine

## 2013-03-02 ENCOUNTER — Ambulatory Visit (INDEPENDENT_AMBULATORY_CARE_PROVIDER_SITE_OTHER): Payer: Medicaid Other | Admitting: Internal Medicine

## 2013-03-02 ENCOUNTER — Encounter (INDEPENDENT_AMBULATORY_CARE_PROVIDER_SITE_OTHER): Payer: Self-pay | Admitting: Internal Medicine

## 2013-03-02 VITALS — BP 104/56 | HR 56 | Temp 98.6°F | Ht 66.0 in | Wt 254.6 lb

## 2013-03-02 DIAGNOSIS — R197 Diarrhea, unspecified: Secondary | ICD-10-CM

## 2013-03-02 DIAGNOSIS — K6289 Other specified diseases of anus and rectum: Secondary | ICD-10-CM

## 2013-03-02 MED ORDER — HYDROCORTISONE ACE-PRAMOXINE 1-1 % RE FOAM: 1.0000 | Freq: Two times a day (BID) | RECTAL | Status: DC

## 2013-03-02 MED ORDER — NYSTATIN-TRIAMCINOLONE 100000-0.1 UNIT/GM-% EX OINT: 1.0000 "application " | TOPICAL_OINTMENT | Freq: Two times a day (BID) | CUTANEOUS | Status: DC

## 2013-03-02 NOTE — Progress Notes (Signed)
Subjective:     Patient ID: [name], female   DOB: 11-23-1950, 61 y.o.   MRN: [id number]  HPI Presents today with c/o of diarrhea x 3 months. It will stop for a day and then start again.  When she has diarrhea she will have 9-10 stools a day. She is taking Imodium and Lomotil. She has nausea.  Her symptoms started when she was started on the Opana 30mg  BID. She was started on this medication x 7 months.  She was placed on Cipro by Dr. Sherril Croon last week for a UTI. She says she is still burning when she voids. She was treated x 7 days for a UTI She has a hx of chronic diarrhea and has been negative for microscopic colitis in the past.  She has lost 7 pounds since her last visit in October which she says was intentional. She occasionally see blood when she wipes. 12/31/2012 EGD/Colonoscopy: Impression:  EGD findings;  No evidence of erosive esophagitis, ring or stricture.  No evidence of peptic ulcer disease.  Multiple polyps involving proximal part of the stomach. Appearance suggestive of hyperplastic polyps. Four of these were biopsied for routine histology.  Esophagus dilated by passing 54 and 56 French Maloney dilators but no mucosal disruption induced.  Colonoscopy findings;  Small external hemorrhoids otherwise normal colonoscopy.  No evidence of transverse colitis (noted on CT of 5 weeks ago which means it has resolved).  Biopsy:  Notes Recorded by Malissa Hippo, MD on 01/05/2013 at 6:35 PM Results have been reviewed with patient. Hypoplastic polyps and no evidence of H. pylori infection. Reports to PCP.  Recommendations:  Patient advised to take meloxicam on an as-needed basis.  Dicyclomine 10 mg by mouth 3 times a day when necessary.  I will be contacting patient with biopsy results.   Family hx is positive for colon cancer in mother and brother     Review of Systems Current Outpatient Prescriptions  Medication Sig Dispense Refill  . ALPRAZolam (XANAX) 1 MG tablet  Take 1 mg by mouth 4 (four) times daily.        Marland Kitchen aspirin EC 81 MG tablet Take 81 mg by mouth daily.      Marland Kitchen atorvastatin (LIPITOR) 40 MG tablet Take 40 mg by mouth daily.      Marland Kitchen dicyclomine (BENTYL) 10 MG capsule Take 10 mg by mouth 2 (two) times daily.      . diphenoxylate-atropine (LOMOTIL) 2.5-0.025 MG per tablet Take 1 tablet by mouth 4 (four) times daily as needed for diarrhea or loose stools.      Marland Kitchen estrogens, conjugated, (PREMARIN) 1.25 MG tablet Take 1.25 mg by mouth daily.       . insulin glargine (LANTUS) 100 UNIT/ML injection Inject 60 Units into the skin 2 (two) times daily.       . insulin lispro (HUMALOG) 100 UNIT/ML injection Inject 2-10 Units into the skin 3 (three) times daily before meals. Per patient this is now uses as a sliding scale.      . lidocaine (XYLOCAINE) 5 % ointment Apply 1 application topically at bedtime.       . meloxicam (MOBIC) 15 MG tablet Take 1 tablet (15 mg total) by mouth daily as needed for pain.  30 tablet  1  . methocarbamol (ROBAXIN) 500 MG tablet TAKE 1 TABLET BY MOUTH FOUR TIMES DAILY  60 tablet  1  . metoprolol succinate (TOPROL-XL) 50 MG 24 hr tablet Take 1 tablet (50 mg total)  by mouth daily. Take with or immediately following a meal.  30 tablet  6  . nitroGLYCERIN (NITROSTAT) 0.4 MG SL tablet Place 1 tablet (0.4 mg total) under the tongue every 5 (five) minutes x 3 doses as needed. For chest pains  25 tablet  3  . omeprazole (PRILOSEC) 20 MG capsule Take 20 mg by mouth 2 (two) times daily.        . ondansetron (ZOFRAN) 4 MG tablet Take 1 tablet (4 mg total) by mouth every 6 (six) hours.  12 tablet  0  . oxymorphone (OPANA ER) 30 MG 12 hr tablet Take 1 tablet (30 mg total) by mouth every 12 (twelve) hours.  60 tablet  0  . potassium chloride (K-DUR) 10 MEQ tablet Take 2 tablets (20 mEq total) by mouth 2 (two) times daily.      Marland Kitchen topiramate (TOPAMAX) 200 MG tablet Take 200 mg by mouth daily.      . [DISCONTINUED] diltiazem (TIAZAC) 180 MG 24 hr  capsule Take 1 capsule (180 mg total) by mouth daily.  30 capsule  1   No current facility-administered medications for this visit.   Past Medical History  Diagnosis Date  . Palpitations   . Tachycardia   . CAD (coronary artery disease)     Stent placement circumflex coronary 2007, catheterization 2008 patent stents. Normal LV function  . Chest pain   . Diabetes mellitus     Insulin dependent  . Dyslipidemia   . Anxiety and depression   . Diabetic polyneuropathy      severe on multiple medications  . 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 MI (myocardial infarction)     2007  . Anxiety   . Depression   . Obstructive sleep apnea     CPAP machine at night  . COPD (chronic obstructive pulmonary disease)   . GERD (gastroesophageal reflux disease)    Past Surgical History  Procedure Laterality Date  . Coronary angioplasty with stent placement    . Colonoscopy  08/06/2011    Procedure: COLONOSCOPY;  Surgeon: Malissa Hippo, MD;  Location: [facility];  Service: Endoscopy;  Laterality: N/A;  215  . Arm surgery Bilateral     due to fall-pt broke both forearms  . Colonoscopy with propofol N/A 12/31/2012    Procedure: COLONOSCOPY WITH PROPOFOL;  Surgeon: Malissa Hippo, MD;  Location: [facility];  Service: Endoscopy;  Laterality: N/A;  in cecum at 0814, total withdrawal time  . Esophagogastroduodenoscopy (egd) with propofol N/A 12/31/2012    Procedure: ESOPHAGOGASTRODUODENOSCOPY (EGD) WITH PROPOFOL;  Surgeon: Malissa Hippo, MD;  Location: [facility];  Service: Endoscopy;  Laterality: N/A;  GE junction 37,  . Maloney dilation N/A 12/31/2012    Procedure: Elease Hashimoto DILATION;  Surgeon: Malissa Hippo, MD;  Location: [facility];  Service: Endoscopy;  Laterality: N/A;  used a # 54,#56       Objective:   Physical Exam  Filed Vitals:   03/02/13 1435  BP: 104/56  Pulse: 56  Temp: 98.6 F (37 C)  Height: 5\' 6"  (1.676 m)  Weight: 254 lb 9.6 oz (115.486 kg)  Alert and oriented. Skin warm and dry. Oral  mucosa is moist.   . Sclera anicteric, conjunctivae is pink. Thyroid not enlarged. No cervical lymphadenopathy. Lungs clear. Heart regular rate and rhythm.  Abdomen is soft. Bowel sounds are positive. No hepatomegaly. No abdominal masses felt.Slight lower abdominal tenderness. Rectal irritation.(redness)      Assessment:  She has not responded to conservative measures..Recent colonoscopy was normal.    Rectal irritation from diarrhea.  Plan:    Increase Dicyclomine to QID.Fiber 4 gms po daily Imodium BID. Mycolog cream BID. PR in 2w weeks

## 2013-03-02 NOTE — Patient Instructions (Addendum)
Dicycomine 10mg  QID, Imodium BID when you have diarrhea. Mycolog cream BID. PR in 2 weeks

## 2013-03-07 ENCOUNTER — Other Ambulatory Visit: Payer: Self-pay | Admitting: *Deleted

## 2013-03-07 MED ORDER — OXYMORPHONE HCL ER 30 MG PO TB12: 30.0000 mg | ORAL_TABLET | Freq: Two times a day (BID) | ORAL | Status: DC

## 2013-03-07 NOTE — Telephone Encounter (Signed)
RX printed early for controlled medication for the visit with RN on 03/18/13 (to be signed by MD) 

## 2013-03-18 ENCOUNTER — Encounter: Payer: Self-pay | Admitting: *Deleted

## 2013-03-18 ENCOUNTER — Encounter: Payer: Medicaid Other | Attending: Physical Medicine & Rehabilitation | Admitting: *Deleted

## 2013-03-18 VITALS — BP 137/68 | HR 63 | Resp 14 | Ht 66.0 in | Wt 240.0 lb

## 2013-03-18 DIAGNOSIS — M545 Low back pain, unspecified: Secondary | ICD-10-CM

## 2013-03-18 DIAGNOSIS — M48061 Spinal stenosis, lumbar region without neurogenic claudication: Secondary | ICD-10-CM

## 2013-03-18 NOTE — Progress Notes (Signed)
Here for pill count and medication refills. Opana ER 30 mg  # 72 Fill date  02/17/13   Today NV# 3  VSS    Pain level:7 Has been having a problem with diarrhea and nausea for the last 3 months and her PCP and GI doctors have not bee able to give her an explanation for this. She says that she is so weak that she is unable to do much or enjoy life. She does not want to eat and did not enjoy the holidays.  Her VSS and do not reflect dehydration with low bp or high pulse rate.  Medication pill counts are appropriate.  Refills given for the Opana ER 30 mg.  No other changes noted.  Return to see RN for pill count and med refill in one month and see MD or PA in 2 months.

## 2013-03-18 NOTE — Patient Instructions (Signed)
Follow up one month with RN for pill count and med refill and 2 month with MD or PA

## 2013-03-21 ENCOUNTER — Other Ambulatory Visit: Payer: Self-pay

## 2013-03-21 MED ORDER — METHOCARBAMOL 500 MG PO TABS: ORAL_TABLET | ORAL | Status: DC

## 2013-03-28 ENCOUNTER — Other Ambulatory Visit: Payer: Self-pay | Admitting: *Deleted

## 2013-03-28 DIAGNOSIS — Z79899 Other long term (current) drug therapy: Secondary | ICD-10-CM

## 2013-03-28 DIAGNOSIS — E876 Hypokalemia: Secondary | ICD-10-CM

## 2013-03-28 MED ORDER — POTASSIUM CHLORIDE ER 10 MEQ PO TBCR: 40.0000 meq | EXTENDED_RELEASE_TABLET | Freq: Two times a day (BID) | ORAL | Status: DC

## 2013-03-30 ENCOUNTER — Other Ambulatory Visit: Payer: Self-pay | Admitting: *Deleted

## 2013-03-30 MED ORDER — POTASSIUM CHLORIDE ER 10 MEQ PO TBCR: 20.0000 meq | EXTENDED_RELEASE_TABLET | Freq: Two times a day (BID) | ORAL | Status: DC

## 2013-04-06 ENCOUNTER — Other Ambulatory Visit: Payer: Self-pay | Admitting: Cardiovascular Disease

## 2013-04-07 LAB — BASIC METABOLIC PANEL
BUN: 7 mg/dL (ref 6–23)
CO2: 26 mEq/L (ref 19–32)
Calcium: 9 mg/dL (ref 8.4–10.5)
Chloride: 105 mEq/L (ref 96–112)
Creat: 0.62 mg/dL (ref 0.50–1.10)
Glucose, Bld: 104 mg/dL — ABNORMAL HIGH (ref 70–99)
Potassium: 4.1 mEq/L (ref 3.5–5.3)
Sodium: 140 mEq/L (ref 135–145)

## 2013-04-08 ENCOUNTER — Ambulatory Visit: Payer: Self-pay | Admitting: Podiatrist

## 2013-04-13 ENCOUNTER — Ambulatory Visit: Payer: Self-pay | Admitting: Podiatrist

## 2013-04-18 ENCOUNTER — Telehealth: Payer: Self-pay

## 2013-04-18 ENCOUNTER — Telehealth (INDEPENDENT_AMBULATORY_CARE_PROVIDER_SITE_OTHER): Payer: Self-pay | Admitting: Internal Medicine

## 2013-04-18 ENCOUNTER — Ambulatory Visit (HOSPITAL_BASED_OUTPATIENT_CLINIC_OR_DEPARTMENT_OTHER): Payer: Medicaid Other | Admitting: Physical Medicine & Rehabilitation

## 2013-04-18 ENCOUNTER — Encounter: Payer: Self-pay | Admitting: Physical Medicine & Rehabilitation

## 2013-04-18 ENCOUNTER — Encounter: Payer: Medicaid Other | Attending: Physical Medicine and Rehabilitation

## 2013-04-18 VITALS — BP 147/68 | HR 60 | Resp 14 | Ht 66.0 in | Wt 237.0 lb

## 2013-04-18 DIAGNOSIS — E1142 Type 2 diabetes mellitus with diabetic polyneuropathy: Secondary | ICD-10-CM | POA: Insufficient documentation

## 2013-04-18 DIAGNOSIS — M48061 Spinal stenosis, lumbar region without neurogenic claudication: Secondary | ICD-10-CM

## 2013-04-18 DIAGNOSIS — M47817 Spondylosis without myelopathy or radiculopathy, lumbosacral region: Secondary | ICD-10-CM | POA: Insufficient documentation

## 2013-04-18 DIAGNOSIS — K589 Irritable bowel syndrome without diarrhea: Secondary | ICD-10-CM

## 2013-04-18 DIAGNOSIS — E1149 Type 2 diabetes mellitus with other diabetic neurological complication: Secondary | ICD-10-CM

## 2013-04-18 DIAGNOSIS — M6250 Muscle wasting and atrophy, not elsewhere classified, unspecified site: Secondary | ICD-10-CM | POA: Insufficient documentation

## 2013-04-18 DIAGNOSIS — Z79899 Other long term (current) drug therapy: Secondary | ICD-10-CM | POA: Insufficient documentation

## 2013-04-18 DIAGNOSIS — Z794 Long term (current) use of insulin: Secondary | ICD-10-CM | POA: Insufficient documentation

## 2013-04-18 MED ORDER — OXYMORPHONE HCL ER 30 MG PO TB12: 30.0000 mg | ORAL_TABLET | Freq: Two times a day (BID) | ORAL | Status: DC

## 2013-04-18 MED ORDER — METHOCARBAMOL 500 MG PO TABS: 500.0000 mg | ORAL_TABLET | Freq: Three times a day (TID) | ORAL | Status: DC | PRN

## 2013-04-18 NOTE — Telephone Encounter (Signed)
Robaxin 57m po q8 hr prn #90 1 RF

## 2013-04-18 NOTE — Patient Instructions (Signed)
If you're doctors thank there may be some additives in the Opana that could cause diarrhea, we would need to switch to to something else in order to avoid withdrawal reaction. These call the office if this occurs.

## 2013-04-18 NOTE — Telephone Encounter (Signed)
[facility] called in regards to patient's Robaxin RX. Can you clarify the directions?

## 2013-04-18 NOTE — Telephone Encounter (Signed)
Rx for Dicyclomine 10mg  TID as needed. With 3 refills.

## 2013-04-18 NOTE — Progress Notes (Signed)
Subjective:    Patient ID: [name], female    DOB: 02/02/1951, 63 y.o.   MRN: [id number]  HPI Problems with chronic diarrhea. Past history significant for irritable bowel. Has followed up with primary care physician as well as gastroenterologist. No obvious cause. Has had colonoscopy.  Back pain is about the same as previous. No new issues. No radiating pain. Reviewed prior MRI from July 2014. No compressive lesions.   Pain Inventory Average Pain 8 Pain Right Now 7 My pain is sharp, burning, stabbing, tingling and aching  In the last 24 hours, has pain interfered with the following? General activity 7 Relation with others 5 Enjoyment of life 8 What TIME of day is your pain at its worst? night Sleep (in general) Poor  Pain is worse with: walking, bending, standing and some activites Pain improves with: heat/ice and medication Relief from Meds: 4  Mobility use a cane how many minutes can you walk? 10 ability to climb steps?  yes do you drive?  yes Do you have any goals in this area?  yes  Function disabled: date disabled . I need assistance with the following:  meal prep, household duties and shopping  Neuro/Psych weakness tingling dizziness depression anxiety  Prior Studies Any changes since last visit?  no  Physicians involved in your care Any changes since last visit?  no   Family History  Problem Relation Age of Onset  . Cancer    . Coronary artery disease     History   Social History  . Marital Status: Legally Separated    Spouse Name: N/A    Number of Children: N/A  . Years of Education: N/A   Occupational History  . Disabled    Social History Main Topics  . Smoking status: Never Smoker   . Smokeless tobacco: Never Used  . Alcohol Use: No  . Drug Use: No  . Sexual Activity: None   Other Topics Concern  . None   Social History Narrative   Patient does not get regular exercise   Past Surgical History  Procedure Laterality  Date  . Coronary angioplasty with stent placement    . Colonoscopy  08/06/2011    Procedure: COLONOSCOPY;  Surgeon: Rogene Houston, MD;  [facility];  Service: Endoscopy;  Laterality: N/A;  215  . Arm surgery Bilateral     due to fall-pt broke both forearms  . Colonoscopy with propofol N/A 12/31/2012    Procedure: COLONOSCOPY WITH PROPOFOL;  Surgeon: Rogene Houston, MD;  Location: [facility];  Service: Endoscopy;  Laterality: N/A;  in cecum at 0814, total withdrawal time 46mn  . Esophagogastroduodenoscopy (egd) with propofol N/A 12/31/2012    Procedure: ESOPHAGOGASTRODUODENOSCOPY (EGD) WITH PROPOFOL;  Surgeon: NRogene Houston MD;  Location: [facility];  Service: Endoscopy;  Laterality: N/A;  GE junction 37,  . Maloney dilation N/A 12/31/2012    Procedure: MVenia MinksDILATION;  Surgeon: NRogene Houston MD;  Location: [facility];  Service: Endoscopy;  Laterality: N/A;  used a # 54,#56   Past Medical History  Diagnosis Date  . Palpitations   . Tachycardia   . CAD (coronary artery disease)     Stent placement circumflex coronary 2007, catheterization 2008 patent stents. Normal LV function  . Chest pain   . Diabetes mellitus     Insulin dependent  . Dyslipidemia   . Anxiety and depression   . Diabetic polyneuropathy      severe on multiple medications  .  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 MI (myocardial infarction)     2007  . Anxiety   . Depression   . Obstructive sleep apnea     CPAP machine at night  . COPD (chronic obstructive pulmonary disease)   . GERD (gastroesophageal reflux disease)    BP 147/68  Pulse 60  Resp 14  Ht 5' 6"  (1.676 m)  Wt 237 lb (107.502 kg)  BMI 38.27 kg/m2  SpO2 95%  Opioid Risk Score:   Fall Risk Score: Moderate Fall Risk (6-13 points) (patient educated handout given)   Review of Systems  Gastrointestinal: Positive for nausea, vomiting, abdominal pain and diarrhea.  Musculoskeletal: Positive for back pain.  Neurological: Positive for dizziness and  weakness.  Psychiatric/Behavioral: Positive for dysphoric mood. The patient is nervous/anxious.   All other systems reviewed and are negative.       Objective:   Physical Exam  Physical Exam  Nursing note and vitals reviewed.  Constitutional: She is oriented to person, place, and time. She appears well-developed.  HENT:  Head: Normocephalic.   Neck: Normal range of motion.  Musculoskeletal:  Lumbar back: She exhibits decreased range of motion. She exhibits no tenderness, no edema and no deformity.  Negative straight leg raising test  Neurological: She is alert and oriented to person, place, and time. She has normal strength. Gait abnormal.  In place with a rolling walker. Standing balance is fair  Psychiatric: She is slowed. She exhibits a depressed mood.        Assessment & Plan:  Lumbar pain lumbar spondylosis which did not respond to medial branch blocks. Has no significant lumbar spinal stenosis on latest MRI 09/26/2012  2.  Severe diabetic poly neuropathy, off gabapentin but is still on Topamax.  PCP is stopping meds that potentially can cause diarrhea.  Do no expect Opana to cause this unless an inert ingredient is responsible, pt does have IBS hx.  Cont Opana ER 23m BID for now

## 2013-04-19 MED ORDER — METHOCARBAMOL 500 MG PO TABS: 500.0000 mg | ORAL_TABLET | Freq: Three times a day (TID) | ORAL | Status: DC | PRN

## 2013-04-19 NOTE — Telephone Encounter (Signed)
New order for robaxin sent to pharmacy.

## 2013-04-22 ENCOUNTER — Ambulatory Visit (INDEPENDENT_AMBULATORY_CARE_PROVIDER_SITE_OTHER): Payer: Medicaid Other

## 2013-04-22 VITALS — BP 145/65 | HR 74 | Resp 16 | Ht 66.0 in | Wt 236.0 lb

## 2013-04-22 DIAGNOSIS — E1140 Type 2 diabetes mellitus with diabetic neuropathy, unspecified: Secondary | ICD-10-CM

## 2013-04-22 DIAGNOSIS — B351 Tinea unguium: Secondary | ICD-10-CM

## 2013-04-22 DIAGNOSIS — S90212A Contusion of left great toe with damage to nail, initial encounter: Secondary | ICD-10-CM

## 2013-04-22 DIAGNOSIS — M79609 Pain in unspecified limb: Secondary | ICD-10-CM

## 2013-04-22 DIAGNOSIS — E1142 Type 2 diabetes mellitus with diabetic polyneuropathy: Secondary | ICD-10-CM

## 2013-04-22 DIAGNOSIS — E1149 Type 2 diabetes mellitus with other diabetic neurological complication: Secondary | ICD-10-CM

## 2013-04-22 DIAGNOSIS — L600 Ingrowing nail: Secondary | ICD-10-CM

## 2013-04-22 DIAGNOSIS — S90129A Contusion of unspecified lesser toe(s) without damage to nail, initial encounter: Secondary | ICD-10-CM

## 2013-04-22 MED ORDER — CEPHALEXIN 500 MG PO CAPS: 500.0000 mg | ORAL_CAPSULE | Freq: Three times a day (TID) | ORAL | Status: DC

## 2013-04-22 NOTE — Progress Notes (Signed)
Subjective:    Patient ID: [name], female    DOB: 12/02/1950, 63 y.o.   MRN: [id number]  HPI Comments: "My toes are hurting me so bad"  Patient C/O of 1st and 5th toes bilateral. Been throbbing and having sharp pains for 4-5 months now. The 1st toes bilateral are swollen, red, and bruised around the base of the nail. The 5th toe right, the toenail is missing and the toe is red. The 5th toe left has a thickened lateral corner at nail. She denies injury, but does state that she feels her toes are hitting the ends of her old diabetic shoes. She has tried to get new one this year but says that she was denied due to insurance change. Dr. Woody Seller Rx 'd her lidocaine ointment that she rubs all over feet at night. Patient says she has neuropathy and was taken off her Gabapentin because it wasn't helping her. She was taking 400 mg 6 x daily. Pain doc refuses to give her pain meds for break through pain. She said she did better while on the hydrocodone.   Patient is very frustrated that she can not get relief. She is asking if she can get her toenails cut today too.     Review of Systems  Constitutional: Positive for activity change and fatigue.  Respiratory: Positive for cough and shortness of breath.   Cardiovascular: Positive for chest pain.       Calf pain with walking   Gastrointestinal: Positive for nausea and diarrhea.  Endocrine: Positive for polyuria.  Genitourinary: Positive for difficulty urinating.  Musculoskeletal: Positive for arthralgias, back pain, gait problem and myalgias.  Neurological: Positive for dizziness, weakness, light-headedness and headaches.  Psychiatric/Behavioral: The patient is nervous/anxious.   All other systems reviewed and are negative.       Objective:   Physical Exam Vascular status is intact as follows DP pulses plus one over 4 bilateral PT +2/4 bilateral. Capillary refill time 3 seconds all digits skin temperature warm to cool. Turgor diminished  there is mild edema both legs patient is not wearing her compression stockings. Plus one +2 pitting edema. Neurologically epicritic and proprioceptive sensations intact and symmetric proximal nail distal has loss of sensation Semmes Weinstein testing to forefoot digits and arch. Patient is severe hyperesthesia both great toes and the fifth toes bilateral indicates that her shoes are causing this problem she is unable to cut her nails and the nails are extremely long hypertrophic having developed contusions the nails and nail beds to her shoes which resulted a hematoma sub-first left. There is fluctuance and ecchymosis under the left hallux nail bed proximal nail fold there is some on the left on the right hallux as well but not as severe nails are thick and brittle crumbly discolored and criptotic vertigo both great toenails and fifth digit nails as well. Patient is ambulating in a pair slip on type shoes. The shoes that she still be too short on her in a knee is concerned toes are hitting the ends of her shoes. Causing contusion and hematoma . Patient seen a pain management specialist currently has wished or offered gabapentin however she is on multiple other pain medications proxy more for Robaxin etc. advised that as far as her peripheral neuropathy she is to follow up with her pain management specialist for any additional pain medications or changes in therapies.       Assessment & Plan:  Assessment contusion with hallux bilateral with hematoma left hallux  nail bed proximal nail fold at this time the nails are debrided painful mycotic nails 1 through 5 bilateral in particular attention to the hallux nails bilateral however in the left hallux nail toe is blocked with 3 cc 50-50 mixture present Xylocaine plain and 0.5% Marcaine plain Betadine prep performed and a 2 mm punch biopsies place the proximal nail fold and nail bed to relief pressure and hematoma some old dried brown red blood is expressed from the  area. Patient is placed and Neosporin bandage dressing will initiate antibacterial soaking and maintain antibiotic regimen of cephalexin 500 mg 3 times a day x10 days which is 4 to her pharmacy. The only additional pain medicine I suggest is plain Tylenol. Followup in 2-3 weeks for nail check and reevaluation. As far as her pain management she is to followup with her specialists after next visit we may look into are discussed with her primary physician possible authorizations for new diabetic shoes if needed.Harriet Masson DPM

## 2013-04-22 NOTE — Patient Instructions (Signed)
ANTIBACTERIAL SOAP INSTRUCTIONS  THE DAY AFTER PROCEDURE  Please follow the instructions your doctor has marked.   Shower as usual. Before getting out, place a drop of antibacterial liquid soap (Dial) on a wet, clean washcloth.  Gently wipe washcloth over affected area.  Afterward, rinse the area with warm water.  Blot the area dry with a soft cloth and cover with antibiotic ointment (neosporin, polysporin, bacitracin) and band aid or gauze and tape  Place 3-4 drops of antibacterial liquid soap in a quart of warm tap water.  Submerge foot into water for 20 minutes.  If bandage was applied after your procedure, leave on to allow for easy lift off, then remove and continue with soak for the remaining time.  Next, blot area dry with a soft cloth and cover with a bandage.  Apply other medications as directed by your doctor, such as cortisporin otic solution (eardrops) or neosporin antibiotic ointment   Diabetes and Foot Care Diabetes may cause you to have problems because of poor blood supply (circulation) to your feet and legs. This may cause the skin on your feet to become thinner, break easier, and heal more slowly. Your skin may become dry, and the skin may peel and crack. You may also have nerve damage in your legs and feet causing decreased feeling in them. You may not notice minor injuries to your feet that could lead to infections or more serious problems. Taking care of your feet is one of the most important things you can do for yourself.  HOME CARE INSTRUCTIONS  Wear shoes at all times, even in the house. Do not go barefoot. Bare feet are easily injured.  Check your feet daily for blisters, cuts, and redness. If you cannot see the bottom of your feet, use a mirror or ask someone for help.  Wash your feet with warm water (do not use hot water) and mild soap. Then pat your feet and the areas between your toes until they are completely dry. Do not soak your feet as this can dry your  skin.  Apply a moisturizing lotion or petroleum jelly (that does not contain alcohol and is unscented) to the skin on your feet and to dry, brittle toenails. Do not apply lotion between your toes.  Trim your toenails straight across. Do not dig under them or around the cuticle. File the edges of your nails with an emery board or nail file.  Do not cut corns or calluses or try to remove them with medicine.  Wear clean socks or stockings every day. Make sure they are not too tight. Do not wear knee-high stockings since they may decrease blood flow to your legs.  Wear shoes that fit properly and have enough cushioning. To break in new shoes, wear them for just a few hours a day. This prevents you from injuring your feet. Always look in your shoes before you put them on to be sure there are no objects inside.  Do not cross your legs. This may decrease the blood flow to your feet.  If you find a minor scrape, cut, or break in the skin on your feet, keep it and the skin around it clean and dry. These areas may be cleansed with mild soap and water. Do not cleanse the area with peroxide, alcohol, or iodine.  When you remove an adhesive bandage, be sure not to damage the skin around it.  If you have a wound, look at it several times a day to  to make sure it is healing.  Do not use heating pads or hot water bottles. They may burn your skin. If you have lost feeling in your feet or legs, you may not know it is happening until it is too late.  Make sure your health care provider performs a complete foot exam at least annually or more often if you have foot problems. Report any cuts, sores, or bruises to your health care provider immediately. SEEK MEDICAL CARE IF:   You have an injury that is not healing.  You have cuts or breaks in the skin.  You have an ingrown nail.  You notice redness on your legs or feet.  You feel burning or tingling in your legs or feet.  You have pain or cramps in your  legs and feet.  Your legs or feet are numb.  Your feet always feel cold. SEEK IMMEDIATE MEDICAL CARE IF:   There is increasing redness, swelling, or pain in or around a wound.  There is a red line that goes up your leg.  Pus is coming from a wound.  You develop a fever or as directed by your health care provider.  You notice a bad smell coming from an ulcer or wound. Document Released: 02/29/2000 Document Revised: 11/03/2012 Document Reviewed: 08/10/2012 Exit[employer] Patient Information 2014 [employer].  

## 2013-04-27 ENCOUNTER — Ambulatory Visit: Payer: Self-pay | Admitting: Podiatrist

## 2013-05-13 ENCOUNTER — Ambulatory Visit (INDEPENDENT_AMBULATORY_CARE_PROVIDER_SITE_OTHER): Payer: Medicaid Other

## 2013-05-13 ENCOUNTER — Other Ambulatory Visit: Payer: Self-pay | Admitting: *Deleted

## 2013-05-13 VITALS — BP 114/67 | HR 67 | Resp 12

## 2013-05-13 DIAGNOSIS — S9030XA Contusion of unspecified foot, initial encounter: Secondary | ICD-10-CM

## 2013-05-13 DIAGNOSIS — M79609 Pain in unspecified limb: Secondary | ICD-10-CM

## 2013-05-13 DIAGNOSIS — E1149 Type 2 diabetes mellitus with other diabetic neurological complication: Secondary | ICD-10-CM

## 2013-05-13 DIAGNOSIS — E1142 Type 2 diabetes mellitus with diabetic polyneuropathy: Secondary | ICD-10-CM

## 2013-05-13 DIAGNOSIS — S90129A Contusion of unspecified lesser toe(s) without damage to nail, initial encounter: Secondary | ICD-10-CM

## 2013-05-13 DIAGNOSIS — E1140 Type 2 diabetes mellitus with diabetic neuropathy, unspecified: Secondary | ICD-10-CM

## 2013-05-13 DIAGNOSIS — R52 Pain, unspecified: Secondary | ICD-10-CM

## 2013-05-13 DIAGNOSIS — S90212A Contusion of left great toe with damage to nail, initial encounter: Secondary | ICD-10-CM

## 2013-05-13 MED ORDER — OXYMORPHONE HCL ER 30 MG PO TB12: 30.0000 mg | ORAL_TABLET | Freq: Two times a day (BID) | ORAL | Status: DC

## 2013-05-13 NOTE — Patient Instructions (Signed)
Diabetes and Foot Care Diabetes may cause you to have problems because of poor blood supply (circulation) to your feet and legs. This may cause the skin on your feet to become thinner, break easier, and heal more slowly. Your skin may become dry, and the skin may peel and crack. You may also have nerve damage in your legs and feet causing decreased feeling in them. You may not notice minor injuries to your feet that could lead to infections or more serious problems. Taking care of your feet is one of the most important things you can do for yourself.  HOME CARE INSTRUCTIONS  Wear shoes at all times, even in the house. Do not go barefoot. Bare feet are easily injured.  Check your feet daily for blisters, cuts, and redness. If you cannot see the bottom of your feet, use a mirror or ask someone for help.  Wash your feet with warm water (do not use hot water) and mild soap. Then pat your feet and the areas between your toes until they are completely dry. Do not soak your feet as this can dry your skin.  Apply a moisturizing lotion or petroleum jelly (that does not contain alcohol and is unscented) to the skin on your feet and to dry, brittle toenails. Do not apply lotion between your toes.  Trim your toenails straight across. Do not dig under them or around the cuticle. File the edges of your nails with an emery board or nail file.  Do not cut corns or calluses or try to remove them with medicine.  Wear clean socks or stockings every day. Make sure they are not too tight. Do not wear knee-high stockings since they may decrease blood flow to your legs.  Wear shoes that fit properly and have enough cushioning. To break in new shoes, wear them for just a few hours a day. This prevents you from injuring your feet. Always look in your shoes before you put them on to be sure there are no objects inside.  Do not cross your legs. This may decrease the blood flow to your feet.  If you find a minor scrape,  cut, or break in the skin on your feet, keep it and the skin around it clean and dry. These areas may be cleansed with mild soap and water. Do not cleanse the area with peroxide, alcohol, or iodine.  When you remove an adhesive bandage, be sure not to damage the skin around it.  If you have a wound, look at it several times a day to make sure it is healing.  Do not use heating pads or hot water bottles. They may burn your skin. If you have lost feeling in your feet or legs, you may not know it is happening until it is too late.  Make sure your health care provider performs a complete foot exam at least annually or more often if you have foot problems. Report any cuts, sores, or bruises to your health care provider immediately. SEEK MEDICAL CARE IF:   You have an injury that is not healing.  You have cuts or breaks in the skin.  You have an ingrown nail.  You notice redness on your legs or feet.  You feel burning or tingling in your legs or feet.  You have pain or cramps in your legs and feet.  Your legs or feet are numb.  Your feet always feel cold. SEEK IMMEDIATE MEDICAL CARE IF:   There is increasing redness,   swelling, or pain in or around a wound.  There is a red line that goes up your leg.  Pus is coming from a wound.  You develop a fever or as directed by your health care provider.  You notice a bad smell coming from an ulcer or wound. Document Released: 02/29/2000 Document Revised: 11/03/2012 Document Reviewed: 08/10/2012 [employer] Patient Information 2014 [employer].  

## 2013-05-13 NOTE — Telephone Encounter (Signed)
RX printed early for controlled medication for the visit with RN on 05/16/13 (to be signed by MD)

## 2013-05-13 NOTE — Progress Notes (Signed)
   Subjective:    Patient ID: [name], female    DOB: 11-21-1950, 63 y.o.   MRN: [id number]  HPI '' BOTH GREAT TOENAIL STILL SORE, ESPRCIALLY THE RT FOOT.''  NEW PROBLEM. '' INJURED  RT 2ND TOE 2 WEEKS AGO AND STILL HURTING. TOE GET AGGRAVATED WITH SHOES AND PRESSURE AND THE PAIN IS GETTING WORSE. TRIED TO USE ICE, SOAKING, AND LIDOCAINE ONCE/TWICE A DAY AND IT HELP SOME BUT NOT MUCH.''  Patient having pain on her right foot hallux second and fifth digits. He bumped into a chair early the bed post a while wearing her slight slippers x-rays taken this time reveal no fractures foot strictly contusion of the toes noted   Review of Systems no new changes or findings     Objective:   Physical Exam Vascular status appears to be intact and unchanged DP pulses plus one over 4 bilateral PT +2/4 bilateral capillary refill time 3 seconds all digits epicritic and proprioceptive sensations diminished on Semmes Weinstein testing her has severe hyperesthesia to the toes areas contusion hallux bilateral and second fifth digit right foot. There are no open wounds ulcerations no secondary infections no discharge or drainage patient also has arthropathy and hammertoe deformities his keratoses second digit left foot to digital contracture also diffuse pinch callus hallux and sub-first MTP her bilateral this is been stabilized since she's been using diabetic shoes a regular basis however without shoes will likely recur and having keratoses corns and calluses which can become preoperative. Per patient request my recommendation at this time we'll get authorization forDiabetic extra-depth shoes in 3 sets of insoles are current shoes are getting worn and needle placement.       Assessment & Plan:  Assessment is improving progress following contusion hallux nail plates bilateral second third toes right foot maintain accommodative shoes at all time her current diabetic shoes are worn need replacing patient does  have history keratoses pinch callus the hallux also keratoses second digit left foot due to hammertoe deformity rigid digital contractures these been stable at your with use of diabetic shoes and spacers are can easily recur become symptomatic again. Reappointed with the next month for diabetic shoe measurement fitting if needed we'll obtain authorization for her primary physician Dr. Woody Seller maintain appropriate coming shoes a soap and water cleansing the toes to contusion should resolve in the next month or 2 the bruising should grow out over time.  Harriet Masson DPM

## 2013-05-16 ENCOUNTER — Encounter: Payer: Self-pay | Admitting: *Deleted

## 2013-05-16 ENCOUNTER — Encounter: Payer: Medicaid Other | Attending: Physical Medicine & Rehabilitation | Admitting: *Deleted

## 2013-05-16 VITALS — BP 102/38 | HR 58 | Resp 14 | Wt 234.0 lb

## 2013-05-16 DIAGNOSIS — Z794 Long term (current) use of insulin: Secondary | ICD-10-CM | POA: Insufficient documentation

## 2013-05-16 DIAGNOSIS — J4489 Other specified chronic obstructive pulmonary disease: Secondary | ICD-10-CM | POA: Insufficient documentation

## 2013-05-16 DIAGNOSIS — M545 Low back pain, unspecified: Secondary | ICD-10-CM

## 2013-05-16 DIAGNOSIS — E1142 Type 2 diabetes mellitus with diabetic polyneuropathy: Secondary | ICD-10-CM | POA: Insufficient documentation

## 2013-05-16 DIAGNOSIS — E1149 Type 2 diabetes mellitus with other diabetic neurological complication: Secondary | ICD-10-CM | POA: Insufficient documentation

## 2013-05-16 DIAGNOSIS — F329 Major depressive disorder, single episode, unspecified: Secondary | ICD-10-CM | POA: Insufficient documentation

## 2013-05-16 DIAGNOSIS — I2510 Atherosclerotic heart disease of native coronary artery without angina pectoris: Secondary | ICD-10-CM | POA: Insufficient documentation

## 2013-05-16 DIAGNOSIS — M48061 Spinal stenosis, lumbar region without neurogenic claudication: Secondary | ICD-10-CM | POA: Insufficient documentation

## 2013-05-16 DIAGNOSIS — K219 Gastro-esophageal reflux disease without esophagitis: Secondary | ICD-10-CM | POA: Insufficient documentation

## 2013-05-16 DIAGNOSIS — J449 Chronic obstructive pulmonary disease, unspecified: Secondary | ICD-10-CM | POA: Insufficient documentation

## 2013-05-16 DIAGNOSIS — F411 Generalized anxiety disorder: Secondary | ICD-10-CM | POA: Insufficient documentation

## 2013-05-16 DIAGNOSIS — F3289 Other specified depressive episodes: Secondary | ICD-10-CM | POA: Insufficient documentation

## 2013-05-16 DIAGNOSIS — I252 Old myocardial infarction: Secondary | ICD-10-CM | POA: Insufficient documentation

## 2013-05-16 DIAGNOSIS — G4733 Obstructive sleep apnea (adult) (pediatric): Secondary | ICD-10-CM | POA: Insufficient documentation

## 2013-05-16 NOTE — Patient Instructions (Signed)
Follow up one month with RN for med refill and two month with [name]

## 2013-05-16 NOTE — Progress Notes (Signed)
Here for pill count and medication refills. Opana ER 30 mg #60 Fill date 04/18/13    Today NV#0  Pill count is appropriate.  Refill given.   VSS  No falls.  She is a moderate fall risk and was given the handout for prevention of falls in the home at last visit. Return one month for refill.

## 2013-05-18 ENCOUNTER — Other Ambulatory Visit: Payer: Self-pay | Admitting: *Deleted

## 2013-05-18 MED ORDER — NITROGLYCERIN 0.4 MG SL SUBL: 0.4000 mg | SUBLINGUAL_TABLET | SUBLINGUAL | Status: DC | PRN

## 2013-05-30 ENCOUNTER — Other Ambulatory Visit (INDEPENDENT_AMBULATORY_CARE_PROVIDER_SITE_OTHER): Payer: Self-pay | Admitting: Internal Medicine

## 2013-05-30 DIAGNOSIS — K589 Irritable bowel syndrome without diarrhea: Secondary | ICD-10-CM

## 2013-05-30 MED ORDER — DICYCLOMINE HCL 10 MG PO CAPS: 10.0000 mg | ORAL_CAPSULE | Freq: Two times a day (BID) | ORAL | Status: DC

## 2013-06-01 ENCOUNTER — Ambulatory Visit (INDEPENDENT_AMBULATORY_CARE_PROVIDER_SITE_OTHER): Payer: Medicaid Other | Admitting: Internal Medicine

## 2013-06-10 ENCOUNTER — Ambulatory Visit (INDEPENDENT_AMBULATORY_CARE_PROVIDER_SITE_OTHER): Payer: Medicaid Other

## 2013-06-10 ENCOUNTER — Other Ambulatory Visit: Payer: Self-pay | Admitting: *Deleted

## 2013-06-10 ENCOUNTER — Other Ambulatory Visit: Payer: Self-pay | Admitting: Cardiovascular Disease

## 2013-06-10 VITALS — BP 113/52 | HR 54 | Resp 18 | Ht 66.0 in | Wt 238.0 lb

## 2013-06-10 DIAGNOSIS — E1149 Type 2 diabetes mellitus with other diabetic neurological complication: Secondary | ICD-10-CM

## 2013-06-10 DIAGNOSIS — S90212A Contusion of left great toe with damage to nail, initial encounter: Secondary | ICD-10-CM

## 2013-06-10 DIAGNOSIS — S90129A Contusion of unspecified lesser toe(s) without damage to nail, initial encounter: Secondary | ICD-10-CM

## 2013-06-10 DIAGNOSIS — E1140 Type 2 diabetes mellitus with diabetic neuropathy, unspecified: Secondary | ICD-10-CM

## 2013-06-10 DIAGNOSIS — E1142 Type 2 diabetes mellitus with diabetic polyneuropathy: Secondary | ICD-10-CM

## 2013-06-10 DIAGNOSIS — M2040 Other hammer toe(s) (acquired), unspecified foot: Secondary | ICD-10-CM

## 2013-06-10 MED ORDER — OXYMORPHONE HCL ER 30 MG PO TB12: 30.0000 mg | ORAL_TABLET | Freq: Two times a day (BID) | ORAL | Status: DC

## 2013-06-10 NOTE — Progress Notes (Signed)
   Subjective:    Patient ID: [name], female    DOB: [birth year], 63 y.o.   MRN: [id number] Pt states she continues to have shooting pains in both hallux, and continues soaking, and using Lidocaine. HPI    Review of Systems no new changes or findings at this time     Objective:   Physical Exam Patient is status post contusion to the hallux nails still some ecchymosis of the nail beds slight tenderness patient also is paresthesias or bottoms of both feet consistent with diabetic neuropathy. Neurovascular status otherwise intact pedal pulses DP postal for PT one over 4 refill time 3 seconds patient old diabetic shoes are worn need replacing at this time there is some confusion about authorization however device apparently did sign in fact softened sheet to say step at this time we will take measurements and Boffo impressions the patient's feet in order diabetic accident shoes in her behalf at this time. Patient will followup with the next 3 or 4 weeks when shoes are ready for fitting and dispensing shoe be contacted and fitted appropriate also 10 likely be candidate for diabetic foot and nail care in debridement with needed       Assessment & Plan:  Assessment diabetes with peripheral neuropathy peeling contusion of both hallux patient is advised may take 6 months or longer further contusion or bruise. Grow out completely will continue monitor carefully measurement and Boffo impressions for diabetic accident shoes and custom molded dual density Plastizote inlays is carried out at this time patient be contacted when shoes inlays are ready for fitting and dispensing recheck within 1 month for followup and likely shoe fitting and palliative care is needed  Harriet Masson DPM

## 2013-06-10 NOTE — Patient Instructions (Signed)
Diabetes and Foot Care Diabetes may cause you to have problems because of poor blood supply (circulation) to your feet and legs. This may cause the skin on your feet to become thinner, break easier, and heal more slowly. Your skin may become dry, and the skin may peel and crack. You may also have nerve damage in your legs and feet causing decreased feeling in them. You may not notice minor injuries to your feet that could lead to infections or more serious problems. Taking care of your feet is one of the most important things you can do for yourself.  HOME CARE INSTRUCTIONS  Wear shoes at all times, even in the house. Do not go barefoot. Bare feet are easily injured.  Check your feet daily for blisters, cuts, and redness. If you cannot see the bottom of your feet, use a mirror or ask someone for help.  Wash your feet with warm water (do not use hot water) and mild soap. Then pat your feet and the areas between your toes until they are completely dry. Do not soak your feet as this can dry your skin.  Apply a moisturizing lotion or petroleum jelly (that does not contain alcohol and is unscented) to the skin on your feet and to dry, brittle toenails. Do not apply lotion between your toes.  Trim your toenails straight across. Do not dig under them or around the cuticle. File the edges of your nails with an emery board or nail file.  Do not cut corns or calluses or try to remove them with medicine.  Wear clean socks or stockings every day. Make sure they are not too tight. Do not wear knee-high stockings since they may decrease blood flow to your legs.  Wear shoes that fit properly and have enough cushioning. To break in new shoes, wear them for just a few hours a day. This prevents you from injuring your feet. Always look in your shoes before you put them on to be sure there are no objects inside.  Do not cross your legs. This may decrease the blood flow to your feet.  If you find a minor scrape,  cut, or break in the skin on your feet, keep it and the skin around it clean and dry. These areas may be cleansed with mild soap and water. Do not cleanse the area with peroxide, alcohol, or iodine.  When you remove an adhesive bandage, be sure not to damage the skin around it.  If you have a wound, look at it several times a day to make sure it is healing.  Do not use heating pads or hot water bottles. They may burn your skin. If you have lost feeling in your feet or legs, you may not know it is happening until it is too late.  Make sure your health care provider performs a complete foot exam at least annually or more often if you have foot problems. Report any cuts, sores, or bruises to your health care provider immediately. SEEK MEDICAL CARE IF:   You have an injury that is not healing.  You have cuts or breaks in the skin.  You have an ingrown nail.  You notice redness on your legs or feet.  You feel burning or tingling in your legs or feet.  You have pain or cramps in your legs and feet.  Your legs or feet are numb.  Your feet always feel cold. SEEK IMMEDIATE MEDICAL CARE IF:   There is increasing redness,   swelling, or pain in or around a wound.  There is a red line that goes up your leg.  Pus is coming from a wound.  You develop a fever or as directed by your health care provider.  You notice a bad smell coming from an ulcer or wound. Document Released: 02/29/2000 Document Revised: 11/03/2012 Document Reviewed: 08/10/2012 [employer] Patient Information 2014 [employer].  

## 2013-06-10 NOTE — Telephone Encounter (Signed)
Rx printed for MD to signe for RN visit 06/13/13

## 2013-06-13 ENCOUNTER — Encounter: Payer: Self-pay | Admitting: *Deleted

## 2013-06-13 ENCOUNTER — Encounter (HOSPITAL_BASED_OUTPATIENT_CLINIC_OR_DEPARTMENT_OTHER): Payer: Medicaid Other | Admitting: *Deleted

## 2013-06-13 VITALS — BP 125/46 | HR 52 | Resp 14

## 2013-06-13 DIAGNOSIS — M48061 Spinal stenosis, lumbar region without neurogenic claudication: Secondary | ICD-10-CM

## 2013-06-13 NOTE — Progress Notes (Signed)
Here for pill count and medication refills.  Opana ER 30 mg # 19  Fill date 05/16/13    Today NV# 5  Pill count is appropriate and refill is given today.   VSS    Pain level: 7.  She has had no falls and she is a moderate risk. She was given a handout on fall prevention last visit.  She does not have an Advanced Directive in place and I have given her a packet with information on setting one up.  She has an appt with Dr Letta Pate next month 07/11/13. No other changes noted

## 2013-06-14 ENCOUNTER — Encounter (INDEPENDENT_AMBULATORY_CARE_PROVIDER_SITE_OTHER): Payer: Self-pay | Admitting: Internal Medicine

## 2013-06-14 ENCOUNTER — Ambulatory Visit (INDEPENDENT_AMBULATORY_CARE_PROVIDER_SITE_OTHER): Payer: Medicaid Other | Admitting: Internal Medicine

## 2013-06-14 VITALS — BP 108/68 | HR 64 | Temp 97.7°F | Resp 16 | Ht 66.0 in | Wt 236.9 lb

## 2013-06-14 DIAGNOSIS — R1310 Dysphagia, unspecified: Secondary | ICD-10-CM

## 2013-06-14 DIAGNOSIS — R197 Diarrhea, unspecified: Secondary | ICD-10-CM

## 2013-06-14 DIAGNOSIS — K219 Gastro-esophageal reflux disease without esophagitis: Secondary | ICD-10-CM

## 2013-06-14 MED ORDER — LOPERAMIDE HCL 2 MG PO TABS: 2.0000 mg | ORAL_TABLET | Freq: Two times a day (BID) | ORAL | Status: DC

## 2013-06-14 MED ORDER — PANTOPRAZOLE SODIUM 40 MG PO TBEC: 40.0000 mg | DELAYED_RELEASE_TABLET | Freq: Every day | ORAL | Status: DC

## 2013-06-14 MED ORDER — PSYLLIUM 28 % PO PACK
1.0000 | PACK | Freq: Every day | ORAL | Status: DC
Start: 2013-06-14 — End: 2018-02-05

## 2013-06-14 NOTE — Patient Instructions (Signed)
Physician will call with results of barium study. Can use Dulcolax suppository and/or Fleet enema for constipation but does not take laxatives by mouth

## 2013-06-14 NOTE — Progress Notes (Signed)
Presenting complaint;  Diarrhea and dysphagia.  Subjective:  Patient is 63 year old Caucasian female with multiple medical problems who was last seen on 03/02/2013 for diarrhea and rectal irritation. She has history of irritable bowel syndrome. She also has chronic GERD and history of dysphagia. On her last visit dicyclomine dose was increased and she was advised to take fiber supplement daily Imodium twice a day. She was subsequently switched to Lomotil by PCP. She continues to complain of diarrhea for the last 1 week she's been having formed stools. She also has periods where she is constipated like 2 weeks ago she went 4 days without a bowel movement. When she has diarrhea she has as many as 10-12 stools per day. When this occurs stools are watery and yellow. She also complains of dysphagia to solids and strangled and liquids. She feels food bolus gets Lauzon upper sternal area and eventually goes down to she does not have a good appetite. She has lost 18 pounds in the last 14 weeks.   Current Medications: Outpatient Encounter Prescriptions as of 06/14/2013  Medication Sig  . ALPRAZolam (XANAX) 1 MG tablet Take 1 mg by mouth 4 (four) times daily.    Marland Kitchen aspirin EC 81 MG tablet Take 81 mg by mouth daily.  Marland Kitchen atorvastatin (LIPITOR) 40 MG tablet Take 40 mg by mouth daily.  Marland Kitchen dicyclomine (BENTYL) 10 MG capsule Take 1 capsule (10 mg total) by mouth 2 (two) times daily.  . diphenoxylate-atropine (LOMOTIL) 2.5-0.025 MG per tablet Take 1 tablet by mouth 4 (four) times daily as needed for diarrhea or loose stools.  Marland Kitchen estrogens, conjugated, (PREMARIN) 1.25 MG tablet Take 1.25 mg by mouth daily.   . insulin glargine (LANTUS) 100 UNIT/ML injection Inject 60 Units into the skin 2 (two) times daily.   . insulin lispro (HUMALOG) 100 UNIT/ML injection Inject 2-10 Units into the skin 3 (three) times daily before meals. Per patient this is now uses as a sliding scale.  . lidocaine (XYLOCAINE) 5 % ointment Apply 1  application topically at bedtime.   . meloxicam (MOBIC) 15 MG tablet Take 1 tablet (15 mg total) by mouth daily as needed for pain.  . methocarbamol (ROBAXIN) 500 MG tablet Take 1 tablet (500 mg total) by mouth every 8 (eight) hours as needed for muscle spasms.  . nitroGLYCERIN (NITROSTAT) 0.4 MG SL tablet Place 1 tablet (0.4 mg total) under the tongue every 5 (five) minutes x 3 doses as needed. For chest pains  . nystatin-triamcinolone ointment (MYCOLOG) Apply 1 application topically 2 (two) times daily.  Marland Kitchen omeprazole (PRILOSEC) 20 MG capsule Take 20 mg by mouth 2 (two) times daily.    . ondansetron (ZOFRAN) 4 MG tablet Take 1 tablet (4 mg total) by mouth every 6 (six) hours.  Marland Kitchen oxymorphone (OPANA ER) 30 MG 12 hr tablet Take 1 tablet (30 mg total) by mouth every 12 (twelve) hours.  . potassium chloride (K-DUR) 10 MEQ tablet Take 2 tablets (20 mEq total) by mouth 2 (two) times daily.  Marland Kitchen topiramate (TOPAMAX) 200 MG tablet Take 200 mg by mouth daily.  . TOPROL XL 50 MG 24 hr tablet TAKE 1 TABLET BY MOUTH DAILY WITH OR IMMEDIATELY FOLLOWING A MEAL.     Objective: Blood pressure 108/68, pulse 64, temperature 97.7 F (36.5 C), temperature source Oral, resp. rate 16, height 5' 6"  (1.676 m), weight 236 lb 14.4 oz (107.457 kg). Patient is alert and in no acute distress. Conjunctiva is pink. Sclera is nonicteric Oropharyngeal mucosa  is normal. No neck masses or thyromegaly noted. Cardiac exam with regular rhythm normal S1 and S2. No murmur or gallop noted. Lungs are clear to auscultation. Abdomen is obese. Bowel sounds are normal. On palpation abdomen is soft with mild tenderness in LLQ. No organomegaly or masses.  No LE edema or clubbing noted.    Assessment:  #1. Chronic diarrhea most likely secondary to IBS. Biopsy from mucosa of sigmoid colon at 2013 was negative for microscopic or collagenous colitis. She had another EGD last year and there was no evidence of endoscopic colitis. Omeprazole  may be contribution to her diarrhea. #2. Dysphagia possibly secondary to EMD.   Plan:  Barium pill esophagogram. Discontinue omeprazole and Lomotil. Begin pantoprazole 40 mg by mouth twice a day. Metamucil 4 g by mouth each bedtime. Loperamide 2 mg by mouth twice a day. Office visit in 3 months.

## 2013-06-15 ENCOUNTER — Other Ambulatory Visit: Payer: Self-pay | Admitting: Physical Medicine & Rehabilitation

## 2013-06-17 ENCOUNTER — Ambulatory Visit (INDEPENDENT_AMBULATORY_CARE_PROVIDER_SITE_OTHER): Payer: Medicaid Other | Admitting: Cardiovascular Disease

## 2013-06-17 ENCOUNTER — Encounter: Payer: Self-pay | Admitting: Cardiovascular Disease

## 2013-06-17 VITALS — BP 130/54 | HR 55 | Ht 66.0 in | Wt 218.0 lb

## 2013-06-17 DIAGNOSIS — I10 Essential (primary) hypertension: Secondary | ICD-10-CM

## 2013-06-17 DIAGNOSIS — K589 Irritable bowel syndrome without diarrhea: Secondary | ICD-10-CM

## 2013-06-17 DIAGNOSIS — I5032 Chronic diastolic (congestive) heart failure: Secondary | ICD-10-CM

## 2013-06-17 DIAGNOSIS — I2510 Atherosclerotic heart disease of native coronary artery without angina pectoris: Secondary | ICD-10-CM

## 2013-06-17 NOTE — Patient Instructions (Signed)
Your physician wants you to follow-up in: 6 months You will receive a reminder letter in the mail two months in advance. If you don't receive a letter, please call our office to schedule the follow-up appointment.     Your physician recommends that you continue on your current medications as directed. Please refer to the Current Medication list given to you today.      Thank you for choosing [facility] !        

## 2013-06-17 NOTE — Progress Notes (Signed)
Patient ID: [name], female   DOB: Jun 13, 1950, 63 y.o.   MRN: [id number]      SUBJECTIVE: [name] has a h/o an MI in 2007 with PCI of the LCx. She also has IDDM, sleep apnea, irritable bowel syndrome, GERD, chronic diastolic heart failure, tachycardia-palpitations, and dyslipidemia. She has only had to use sublingual nitroglycerin to alleviate chest tightness on two occasions since her last visit with me. Her primary problem has been with her irritable bowel syndrome. Every time she eats solid foods, she has diarrhea. She believes she has lost approximately 70 pounds. She tells me she is scheduled to undergo a barium swallow next week. She denies leg swelling, orthopnea, and PND.    Allergies  Allergen Reactions  . Nitrofuran Derivatives Itching and Swelling  . Amphetamine-Dextroamphet Er Swelling  . Pregabalin Swelling  . Topiramate Other (See Comments)    Tongue tingle  . Verelan [Verapamil] Rash    Current Outpatient Prescriptions  Medication Sig Dispense Refill  . ALPRAZolam (XANAX) 1 MG tablet Take 1 mg by mouth 4 (four) times daily.        Marland Kitchen aspirin EC 81 MG tablet Take 81 mg by mouth daily.      Marland Kitchen atorvastatin (LIPITOR) 40 MG tablet Take 40 mg by mouth daily.      Marland Kitchen dicyclomine (BENTYL) 10 MG capsule Take 1 capsule (10 mg total) by mouth 2 (two) times daily.  60 capsule  3  . estrogens, conjugated, (PREMARIN) 1.25 MG tablet Take 1.25 mg by mouth daily.       . insulin glargine (LANTUS) 100 UNIT/ML injection Inject 60 Units into the skin 2 (two) times daily.       . insulin lispro (HUMALOG) 100 UNIT/ML injection Inject 2-10 Units into the skin 3 (three) times daily before meals. Per patient this is now uses as a sliding scale.      . lidocaine (XYLOCAINE) 5 % ointment Apply 1 application topically at bedtime.       Marland Kitchen loperamide (IMODIUM A-D) 2 MG tablet Take 1 tablet (2 mg total) by mouth 2 (two) times daily with breakfast and lunch.  60 tablet  5  . meloxicam (MOBIC)  15 MG tablet Take 1 tablet (15 mg total) by mouth daily as needed for pain.  30 tablet  1  . methocarbamol (ROBAXIN) 500 MG tablet TAKE 1 TABLET BY MOUTH EVERY 8 HOURS AS NEEDED FOR MUSCLE SPASMS.  90 tablet  1  . nitroGLYCERIN (NITROSTAT) 0.4 MG SL tablet Place 1 tablet (0.4 mg total) under the tongue every 5 (five) minutes x 3 doses as needed. For chest pains  25 tablet  3  . nystatin-triamcinolone ointment (MYCOLOG) Apply 1 application topically 2 (two) times daily.  30 g  1  . ondansetron (ZOFRAN) 4 MG tablet Take 1 tablet (4 mg total) by mouth every 6 (six) hours.  12 tablet  0  . oxymorphone (OPANA ER) 30 MG 12 hr tablet Take 1 tablet (30 mg total) by mouth every 12 (twelve) hours.  60 tablet  0  . pantoprazole (PROTONIX) 40 MG tablet Take 1 tablet (40 mg total) by mouth daily.  60 tablet  5  . potassium chloride (K-DUR) 10 MEQ tablet Take 2 tablets (20 mEq total) by mouth 2 (two) times daily.      . psyllium (METAMUCIL SMOOTH TEXTURE) 28 % packet Take 1 packet by mouth at bedtime.      . topiramate (TOPAMAX) 200 MG tablet  Take 200 mg by mouth daily.      . TOPROL XL 50 MG 24 hr tablet TAKE 1 TABLET BY MOUTH DAILY WITH OR IMMEDIATELY FOLLOWING A MEAL.  30 tablet  6  . [DISCONTINUED] diltiazem (TIAZAC) 180 MG 24 hr capsule Take 1 capsule (180 mg total) by mouth daily.  30 capsule  1   No current facility-administered medications for this visit.    Past Medical History  Diagnosis Date  . Palpitations   . Tachycardia   . CAD (coronary artery disease)     Stent placement circumflex coronary 2007, catheterization 2008 patent stents. Normal LV function  . Chest pain   . Diabetes mellitus     Insulin dependent  . Dyslipidemia   . Anxiety and depression   . Diabetic polyneuropathy      severe on multiple medications  . 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 MI (myocardial infarction)     2007  . Anxiety   . Depression   . Obstructive sleep apnea     CPAP machine at night  . COPD (chronic  obstructive pulmonary disease)   . GERD (gastroesophageal reflux disease)     Past Surgical History  Procedure Laterality Date  . Coronary angioplasty with stent placement    . Colonoscopy  08/06/2011    Procedure: COLONOSCOPY;  Surgeon: Rogene Houston, MD;  Location: [facility];  Service: Endoscopy;  Laterality: N/A;  215  . Arm surgery Bilateral     due to fall-pt broke both forearms  . Colonoscopy with propofol N/A 12/31/2012    Procedure: COLONOSCOPY WITH PROPOFOL;  Surgeon: Rogene Houston, MD;  Location: [facility];  Service: Endoscopy;  Laterality: N/A;  in cecum at 0814, total withdrawal time 17mn  . Esophagogastroduodenoscopy (egd) with propofol N/A 12/31/2012    Procedure: ESOPHAGOGASTRODUODENOSCOPY (EGD) WITH PROPOFOL;  Surgeon: NRogene Houston MD;  Location: [facility];  Service: Endoscopy;  Laterality: N/A;  GE junction 37,  . Maloney dilation N/A 12/31/2012    Procedure: MVenia MinksDILATION;  Surgeon: NRogene Houston MD;  Location: [facility];  Service: Endoscopy;  Laterality: N/A;  used a # 54,#56    History   Social History  . Marital Status: Legally Separated    Spouse Name: N/A    Number of Children: N/A  . Years of Education: N/A   Occupational History  . Disabled    Social History Main Topics  . Smoking status: Never Smoker   . Smokeless tobacco: Never Used  . Alcohol Use: No  . Drug Use: No  . Sexual Activity: Not on file   Other Topics Concern  . Not on file   Social History Narrative   Patient does not get regular exercise     Filed Vitals:   06/17/13 1001  BP: 130/54  Pulse: 55  Height: 5' 6"  (1.676 m)  Weight: 218 lb (98.884 kg)    PHYSICAL EXAM General: NAD Neck: No JVD, no thyromegaly. Lungs: Clear to auscultation bilaterally with normal respiratory effort. CV: Nondisplaced PMI.  Regular rate and rhythm, normal S1/S2, no S3/S4, no murmur. No pretibial or periankle edema.  No carotid bruit.  Normal pedal pulses.  Abdomen: Soft, nontender, no  hepatosplenomegaly, no distention.  Neurologic: Alert and oriented x 3.  Psych: Normal affect. Extremities: No clubbing or cyanosis.   ECG: reviewed and available in electronic records.      ASSESSMENT AND PLAN: 1. CAD: Symptomatically stable. Continue aspirin, Lipitor, and metoprolol. 2. Chronic diastolic  heart failure: Blood pressure is well controlled and she is euvolemic. 3. Hypertension: Controlled on present therapy.  Dispo: f/u 6 months.  Kate Sable, M.D., F.A.C.C.

## 2013-06-20 ENCOUNTER — Other Ambulatory Visit (HOSPITAL_COMMUNITY): Payer: Medicaid Other

## 2013-06-20 ENCOUNTER — Ambulatory Visit (HOSPITAL_COMMUNITY)
Admission: RE | Admit: 2013-06-20 | Discharge: 2013-06-20 | Disposition: A | Discharge status: Home / Self Care | Payer: Medicaid Other | Source: Ambulatory Visit | Attending: Internal Medicine | Admitting: Internal Medicine

## 2013-06-20 DIAGNOSIS — R1319 Other dysphagia: Secondary | ICD-10-CM | POA: Insufficient documentation

## 2013-06-20 DIAGNOSIS — R1310 Dysphagia, unspecified: Secondary | ICD-10-CM

## 2013-06-20 DIAGNOSIS — K224 Dyskinesia of esophagus: Secondary | ICD-10-CM | POA: Insufficient documentation

## 2013-06-29 ENCOUNTER — Other Ambulatory Visit (INDEPENDENT_AMBULATORY_CARE_PROVIDER_SITE_OTHER): Payer: Self-pay | Admitting: *Deleted

## 2013-06-29 ENCOUNTER — Encounter (HOSPITAL_COMMUNITY): Payer: Self-pay | Admitting: Pharmacy Technician

## 2013-06-29 DIAGNOSIS — R1310 Dysphagia, unspecified: Secondary | ICD-10-CM

## 2013-06-29 NOTE — Patient Instructions (Signed)
[name]  06/29/2013   Your procedure is scheduled on:  07/01/2013  Report to [facility] at  62  AM.  Call this number if you have problems the morning of surgery: [phone]   Remember:   Do not eat food or drink liquids after midnight.   Take these medicines the morning of surgery with A SIP OF WATER:  Xanax, robaxcin, zofran, oxymorphone, protonix, toprol   Do not wear jewelry, make-up or nail polish.  Do not wear lotions, powders, or perfumes.   Do not shave 48 hours prior to surgery. Men may shave face and neck.  Do not bring valuables to the hospital.  [facility] is not responsible for any belongings or valuables.               Contacts, dentures or bridgework may not be worn into surgery.  Leave suitcase in the car. After surgery it may be brought to your room.  For patients admitted to the hospital, discharge time is determined by your treatment team.               Patients discharged the day of surgery will not be allowed to drive home.  Name and phone number of your driver: family  Special Instructions: N/A   Please read over the following fact sheets that you were given: Pain Booklet, Coughing and Deep Breathing, Surgical Site Infection Prevention, Anesthesia Post-op Instructions and Care and Recovery After Surgery Esophageal Dilatation The esophagus is the long, narrow tube which carries food and liquid from the mouth to the stomach. Esophageal dilatation is the technique used to stretch a blocked or narrowed portion of the esophagus. This procedure is used when a part of the esophagus has become so narrow that it becomes difficult, painful or even impossible to swallow. This is generally an uncomplicated form of treatment. When this is not successful, chest surgery may be required. This is a much more extensive form of treatment with a longer recovery time. CAUSES  Some of the more common causes of blockage or strictures of the esophagus  are:  Narrowing from longstanding inflammation (soreness and redness) of the lower esophagus. This comes from the constant exposure of the lower esophagus to the acid which bubbles up from the stomach. Over time this causes scarring and narrowing of the lower esophagus.  Hiatal hernia in which a small part of the stomach bulges (herniates) up through the diaphragm. This can cause a gradual narrowing of the end of the esophagus.  Schatzki's Ring is a narrow ring of benign (non-cancerous) fibrous tissue which constricts the lower esophagus. The reason for this is not known.  Scleroderma is a connective tissue disorder that affects the esophagus and makes swallowing difficult.  Achalasia is an absence of nerves to the lower esophagus and to the esophageal sphincter. This is the circular muscle between the stomach and esophagus that relaxes to allow food into the stomach. After swallowing, it contracts to keep food in the stomach. This absence of nerves may be congenital (present since birth). This can cause irregular spasms of the lower esophageal muscle. This spasm does not open up to allow food and fluid through. The result is a persistent blockage with subsequent slow trickling of the esophageal contents into the stomach.  Strictures may develop from swallowing materials which damage the esophagus. Some examples are strong acids or alkalis such as lye.  Growths such as benign (non-cancerous) and malignant (cancerous)  tumors can block the esophagus.  Heredity (present since birth) causes. DIAGNOSIS  Your caregiver often suspects this problem by taking a medical history. They will also do a physical exam. They can then prove their suspicions using X-rays and endoscopy. Endoscopy is an exam in which a tube like a small flexible telescope is used to look at your esophagus.  TREATMENT There are different stretching (dilating) techniques which can be used. Simple bougie dilatation may be done in the  office. This usually takes only a couple minutes. A numbing (anesthetic) spray of the throat is used. Endoscopy, when done, is done in an endoscopy suite, under mild sedation. When fluoroscopy is used, the procedure is performed in X-ray. Other techniques require a little longer time. Recovery is usually quick. There is no waiting time to begin eating and drinking to test success of the treatment. Following are some of the methods used. Narrowing of the esophagus is treated by making it bigger. Commonly this is a mechanical problem which can be treated with stretching. This can be done in different ways. Your caregiver will discuss these with you. Some of the means used are:  A series of graduated (increasing thickness) flexible dilators can be used. These are weighted tubes passed through the esophagus into the stomach. The tubes used become progressively larger until the desired stretched size is reached. Graduated dilators are a simple and quick way of opening the esophagus. No visualization is required.  Another method is the use of endoscopy to place a flexible wire across the stricture. The endoscope is removed and the wire left in place. A dilator with a hole through it from end to end is guided down the esophagus and across the stricture. One or more of these dilators are passed over the wire. At the end of the exam, the wire is removed. This type of treatment may be performed in the X-ray department under fluoroscopy. An advantage of this procedure is the examiner is visualizing the end opening in the esophagus.  Stretching of the esophagus may be done using balloons. Deflated balloons are placed through the endoscope and across the stricture. This type of balloon dilatation is often done at the time of endoscopy or fluoroscopy. Flexible endoscopy allows the examiner to directly view the stricture. A balloon is inserted in the deflated form into the area of narrowing. It is then inflated with air to  a certain pressure that is pre-set for a given circumference. When inflated, it becomes sausage shaped, stretched, and makes the stricture larger.  Achalasia requires a longer larger balloon-type dilator. This is frequently done under X-ray control. In this situation, the spastic muscle fibers in the lower esophagus are stretched. All of the above procedures make the passage of food and water into the stomach easier. They also make it easier for stomach contents to reflux back into the esophagus. Special medications may be used following the procedure to help prevent further stricturing. Proton-pump inhibitor medications are good at decreasing the amount of acid in the stomach juice. When stomach juice refluxes into the esophagus, the juice is no longer as acidic and is less likely to burn or scar the esophagus. RISKS AND COMPLICATIONS Esophageal dilatation is usually performed effectively and without problems. Some complications that can occur are:  A small amount of bleeding almost always happens where the stretching takes place. If this is too excessive it may require more aggressive treatment.  An uncommon complication is perforation (making a hole) of the esophagus. The  esophagus is thin. It is easy to make a hole in it. If this happens, an operation may be necessary to repair this.  A small, undetected perforation could lead to an infection in the chest. This can be very serious. HOME CARE INSTRUCTIONS   If you received sedation for your procedure, do not drive, make important decisions, or perform any activities requiring your full coordination. Do not drink alcohol, take sedatives, or use any mind altering chemicals unless instructed by your caregiver.  You may use throat lozenges or warm salt water gargles if you have throat discomfort  You can begin eating and drinking normally on return home unless instructed otherwise. Do not purposely try to force large chunks of food down to test the  benefits of your procedure.  Mild discomfort can be eased with sips of ice water.  Medications for discomfort may or may not be needed. SEEK IMMEDIATE MEDICAL CARE IF:   You begin vomiting up blood.  You develop black tarry stools  You develop chills or an unexplained temperature of over 101 F (38.3 C)  You develop chest or abdominal pain.  You develop shortness of breath or feel lightheaded or faint.  Your swallowing is becoming more painful, difficult, or you are unable to swallow. MAKE SURE YOU:   Understand these instructions.  Will watch your condition.  Will get help right away if you are not doing well or get worse. Document Released: 04/24/2005 Document Revised: 05/26/2011 Document Reviewed: 06/11/2005 [facility] Patient Information 2014 [place]. Esophagogastroduodenoscopy Esophagogastroduodenoscopy (EGD) is a procedure to examine the lining of the esophagus, stomach, and first part of the small intestine (duodenum). A long, flexible, lighted tube with a camera attached (endoscope) is inserted down the throat to view these organs. This procedure is done to detect problems or abnormalities, such as inflammation, bleeding, ulcers, or growths, in order to treat them. The procedure lasts about 5 20 minutes. It is usually an outpatient procedure, but it may need to be performed in emergency cases in the hospital. LET YOUR CAREGIVER KNOW ABOUT:   Allergies to food or medicine.  All medicines you are taking, including vitamins, herbs, eyedrops, and over-the-counter medicines and creams.  Use of steroids (by mouth or creams).  Previous problems you or members of your family have had with the use of anesthetics.  Any blood disorders you have.  Previous surgeries you have had.  Other health problems you have.  Possibility of pregnancy, if this applies. RISKS AND COMPLICATIONS  Generally, EGD is a safe procedure. However, as with any procedure, complications can  occur. Possible complications include:  Infection.  Bleeding.  Tearing (perforation) of the esophagus, stomach, or duodenum.  Difficulty breathing or not being able to breath.  Excessive sweating.  Spasms of the larynx.  Slowed heartbeat.  Low blood pressure. BEFORE THE PROCEDURE  Do not eat or drink anything for 6 8 hours before the procedure or as directed by your caregiver.  Ask your caregiver about changing or stopping your regular medicines.  If you wear dentures, be prepared to remove them before the procedure.  Arrange for someone to drive you home after the procedure. PROCEDURE   A vein will be accessed to give medicines and fluids. A medicine to relax you (sedative) and a pain reliever will be given through that access into the vein.  A numbing medicine (local anesthetic) may be sprayed on your throat for comfort and to stop you from gagging or coughing.  A mouth guard  may be placed in your mouth to protect your teeth and to keep you from biting on the endoscope.  You will be asked to lie on your left side.  The endoscope is inserted down your throat and into the esophagus, stomach, and duodenum.  Air is put through the endoscope to allow your caregiver to view the lining of your esophagus clearly.  The esophagus, stomach, and duodenum is then examined. During the exam, your caregiver may:  Remove tissue to be examined under a microscope (biopsy) for inflammation, infection, or other medical problems.  Remove growths.  Remove objects (foreign bodies) that are stuck.  Treat any bleeding with medicines or other devices that stop tissues from bleeding (hot cauters, clipping devices).  Widen (dilate) or stretch narrowed areas of the esophagus and stomach.  The endoscope will then be withdrawn. AFTER THE PROCEDURE  You will be taken to a recovery area to be monitored. You will be able to go home once you are stable and alert.  Do not eat or drink anything  until the local anesthetic and numbing medicines have worn off. You may choke.  It is normal to feel bloated, have pain with swallowing, or have a sore throat for a short time. This will wear off.  Your caregiver should be able to discuss his or her findings with you. It will take longer to discuss the test results if any biopsies were taken. Document Released: 07/04/2004 Document Revised: 02/18/2012 Document Reviewed: 02/04/2012 [address] [facility] Patient Information 2014 [place], [place]. PATIENT INSTRUCTIONS POST-ANESTHESIA  IMMEDIATELY FOLLOWING SURGERY:  Do not drive or operate machinery for the first twenty four hours after surgery.  Do not make any important decisions for twenty four hours after surgery or while taking narcotic pain medications or sedatives.  If you develop intractable nausea and vomiting or a severe headache please notify your doctor immediately.  FOLLOW-UP:  Please make an appointment with your surgeon as instructed. You do not need to follow up with anesthesia unless specifically instructed to do so.  WOUND CARE INSTRUCTIONS (if applicable):  Keep a dry clean dressing on the anesthesia/puncture wound site if there is drainage.  Once the wound has quit draining you may leave it open to air.  Generally you should leave the bandage intact for twenty four hours unless there is drainage.  If the epidural site drains for more than 36-48 hours please call the anesthesia department.  QUESTIONS?:  Please feel free to call your physician or the hospital operator if you have any questions, and they will be happy to assist you.

## 2013-06-30 ENCOUNTER — Encounter (HOSPITAL_COMMUNITY): Payer: Self-pay

## 2013-06-30 ENCOUNTER — Other Ambulatory Visit: Payer: Self-pay

## 2013-06-30 ENCOUNTER — Encounter (HOSPITAL_COMMUNITY)
Admission: RE | Admit: 2013-06-30 | Discharge: 2013-06-30 | Disposition: A | Discharge status: Home / Self Care | Payer: Medicaid Other | Source: Ambulatory Visit | Attending: Internal Medicine | Admitting: Internal Medicine

## 2013-06-30 VITALS — BP 129/66 | HR 54 | Temp 97.6°F | Resp 18 | Ht 66.0 in | Wt 237.0 lb

## 2013-06-30 DIAGNOSIS — R1310 Dysphagia, unspecified: Secondary | ICD-10-CM

## 2013-06-30 HISTORY — DX: Heart failure, unspecified: I50.9

## 2013-06-30 HISTORY — DX: Headache: R51

## 2013-06-30 HISTORY — DX: Unspecified osteoarthritis, unspecified site: M19.90

## 2013-06-30 LAB — BASIC METABOLIC PANEL
BUN: 8 mg/dL (ref 6–23)
CO2: 26 mEq/L (ref 19–32)
Calcium: 8.6 mg/dL (ref 8.4–10.5)
Chloride: 105 mEq/L (ref 96–112)
Creatinine, Ser: 0.66 mg/dL (ref 0.50–1.10)
GFR calc Af Amer: >90 mL/min (ref >90)
GFR calc non Af Amer: >90 mL/min (ref >90)
Glucose, Bld: 186 mg/dL — ABNORMAL HIGH (ref 70–99)
Potassium: 4.8 mEq/L (ref 3.7–5.3)
Sodium: 141 mEq/L (ref 137–147)

## 2013-06-30 LAB — HEMOGLOBIN AND HEMATOCRIT, BLOOD
HCT: 36.1 % (ref 36.0–46.0)
Hemoglobin: 11.5 g/dL — ABNORMAL LOW (ref 12.0–15.0)

## 2013-07-01 ENCOUNTER — Encounter (HOSPITAL_COMMUNITY): Payer: Medicaid Other | Admitting: Anesthesiology

## 2013-07-01 ENCOUNTER — Ambulatory Visit (HOSPITAL_COMMUNITY): Payer: Medicaid Other | Admitting: Anesthesiology

## 2013-07-01 ENCOUNTER — Encounter (HOSPITAL_COMMUNITY): Payer: Self-pay | Admitting: *Deleted

## 2013-07-01 ENCOUNTER — Ambulatory Visit (HOSPITAL_COMMUNITY)
Admission: RE | Admit: 2013-07-01 | Discharge: 2013-07-01 | Disposition: A | Discharge status: Home / Self Care | Payer: Medicaid Other | Source: Ambulatory Visit | Attending: Internal Medicine | Admitting: Internal Medicine

## 2013-07-01 ENCOUNTER — Encounter (HOSPITAL_COMMUNITY)
Admission: RE | Disposition: A | Discharge status: Home / Self Care | Payer: Self-pay | Source: Ambulatory Visit | Attending: Internal Medicine

## 2013-07-01 DIAGNOSIS — R1310 Dysphagia, unspecified: Secondary | ICD-10-CM

## 2013-07-01 DIAGNOSIS — J4489 Other specified chronic obstructive pulmonary disease: Secondary | ICD-10-CM | POA: Insufficient documentation

## 2013-07-01 DIAGNOSIS — F329 Major depressive disorder, single episode, unspecified: Secondary | ICD-10-CM | POA: Insufficient documentation

## 2013-07-01 DIAGNOSIS — F3289 Other specified depressive episodes: Secondary | ICD-10-CM | POA: Insufficient documentation

## 2013-07-01 DIAGNOSIS — J449 Chronic obstructive pulmonary disease, unspecified: Secondary | ICD-10-CM | POA: Insufficient documentation

## 2013-07-01 DIAGNOSIS — F411 Generalized anxiety disorder: Secondary | ICD-10-CM | POA: Insufficient documentation

## 2013-07-01 DIAGNOSIS — K2289 Other specified disease of esophagus: Secondary | ICD-10-CM

## 2013-07-01 DIAGNOSIS — K219 Gastro-esophageal reflux disease without esophagitis: Secondary | ICD-10-CM | POA: Insufficient documentation

## 2013-07-01 DIAGNOSIS — Z79899 Other long term (current) drug therapy: Secondary | ICD-10-CM | POA: Insufficient documentation

## 2013-07-01 DIAGNOSIS — Z794 Long term (current) use of insulin: Secondary | ICD-10-CM | POA: Insufficient documentation

## 2013-07-01 DIAGNOSIS — K900 Celiac disease: Secondary | ICD-10-CM | POA: Insufficient documentation

## 2013-07-01 DIAGNOSIS — K228 Other specified diseases of esophagus: Secondary | ICD-10-CM

## 2013-07-01 DIAGNOSIS — E119 Type 2 diabetes mellitus without complications: Secondary | ICD-10-CM | POA: Insufficient documentation

## 2013-07-01 HISTORY — PX: ESOPHAGOGASTRODUODENOSCOPY (EGD) WITH PROPOFOL: SHX5813

## 2013-07-01 HISTORY — PX: MALONEY DILATION: SHX5535

## 2013-07-01 HISTORY — PX: BIOPSY: SHX5522

## 2013-07-01 LAB — GLUCOSE, CAPILLARY
Glucose-Capillary: 128 mg/dL — ABNORMAL HIGH (ref 70–99)
Glucose-Capillary: 78 mg/dL (ref 70–99)
Glucose-Capillary: 87 mg/dL (ref 70–99)

## 2013-07-01 SURGERY — ESOPHAGOGASTRODUODENOSCOPY (EGD) WITH PROPOFOL
Anesthesia: Monitor Anesthesia Care | Site: Esophagus

## 2013-07-01 MED ORDER — ONDANSETRON HCL 4 MG/2ML IJ SOLN
4.0000 mg | Freq: Once | INTRAMUSCULAR | Status: AC
Start: 2013-07-01 — End: 2013-07-01
  Administered 2013-07-01: 4 mg via INTRAVENOUS

## 2013-07-01 MED ORDER — STERILE WATER FOR IRRIGATION IR SOLN
Status: DC | PRN
  Administered 2013-07-01: 10:00:00

## 2013-07-01 MED ORDER — FENTANYL CITRATE 0.05 MG/ML IJ SOLN
INTRAMUSCULAR | Status: AC
  Filled 2013-07-01: qty 2

## 2013-07-01 MED ORDER — DEXTROSE 50 % IV SOLN
25.0000 mL | Freq: Once | INTRAVENOUS | Status: AC
  Administered 2013-07-01: 25 mL via INTRAVENOUS

## 2013-07-01 MED ORDER — FENTANYL CITRATE 0.05 MG/ML IJ SOLN
25.0000 ug | INTRAMUSCULAR | Status: AC
  Administered 2013-07-01 (×2): 25 ug via INTRAVENOUS

## 2013-07-01 MED ORDER — ONDANSETRON HCL 4 MG/2ML IJ SOLN
INTRAMUSCULAR | Status: AC
  Filled 2013-07-01: qty 2

## 2013-07-01 MED ORDER — PROPOFOL INFUSION 10 MG/ML OPTIME
INTRAVENOUS | Status: DC | PRN
  Administered 2013-07-01: 200 ug/kg/min via INTRAVENOUS

## 2013-07-01 MED ORDER — LIDOCAINE HCL (PF) 1 % IJ SOLN
INTRAMUSCULAR | Status: AC
  Filled 2013-07-01: qty 5

## 2013-07-01 MED ORDER — LIDOCAINE HCL (CARDIAC) 10 MG/ML IV SOLN
INTRAVENOUS | Status: DC | PRN
  Administered 2013-07-01: 10 mg via INTRAVENOUS

## 2013-07-01 MED ORDER — FENTANYL CITRATE 0.05 MG/ML IJ SOLN
INTRAMUSCULAR | Status: DC | PRN
  Administered 2013-07-01: 25 ug via INTRAVENOUS

## 2013-07-01 MED ORDER — GLYCOPYRROLATE 0.2 MG/ML IJ SOLN
INTRAMUSCULAR | Status: AC
  Filled 2013-07-01: qty 1

## 2013-07-01 MED ORDER — MIDAZOLAM HCL 2 MG/2ML IJ SOLN
1.0000 mg | INTRAMUSCULAR | Status: DC | PRN
  Administered 2013-07-01: 2 mg via INTRAVENOUS

## 2013-07-01 MED ORDER — FENTANYL CITRATE 0.05 MG/ML IJ SOLN: 25.0000 ug | INTRAMUSCULAR | Status: DC | PRN

## 2013-07-01 MED ORDER — DEXTROSE 50 % IV SOLN
INTRAVENOUS | Status: AC
  Filled 2013-07-01: qty 50

## 2013-07-01 MED ORDER — PROPOFOL 10 MG/ML IV BOLUS
INTRAVENOUS | Status: AC
  Filled 2013-07-01: qty 20

## 2013-07-01 MED ORDER — GLYCOPYRROLATE 0.2 MG/ML IJ SOLN
0.2000 mg | Freq: Once | INTRAMUSCULAR | Status: AC
  Administered 2013-07-01: 0.2 mg via INTRAVENOUS

## 2013-07-01 MED ORDER — ONDANSETRON HCL 4 MG/2ML IJ SOLN: 4.0000 mg | Freq: Once | INTRAMUSCULAR | Status: DC | PRN

## 2013-07-01 MED ORDER — BUTAMBEN-TETRACAINE-BENZOCAINE 2-2-14 % EX AERO
INHALATION_SPRAY | CUTANEOUS | Status: DC | PRN
  Administered 2013-07-01: 2 via TOPICAL

## 2013-07-01 MED ORDER — DEXTROSE 50 % IV SOLN
12.5000 g | Freq: Once | INTRAVENOUS | Status: AC
  Administered 2013-07-01: 12.5 g via INTRAVENOUS

## 2013-07-01 MED ORDER — MIDAZOLAM HCL 2 MG/2ML IJ SOLN
INTRAMUSCULAR | Status: AC
  Filled 2013-07-01: qty 2

## 2013-07-01 MED ORDER — LACTATED RINGERS IV SOLN
INTRAVENOUS | Status: DC
  Administered 2013-07-01: 10:00:00 via INTRAVENOUS

## 2013-07-01 SURGICAL SUPPLY — 31 items
BALLN CRE LF 10-12 240X5.5 (BALLOONS)
BALLN DILATOR CRE 12-15 240 (BALLOONS)
BALLN DILATOR CRE 15-18 240 (BALLOONS) IMPLANT
BALLN DILATOR CRE 18-20 240 (BALLOONS) IMPLANT
BALLN DILATOR CRE WIREGUIDE (BALLOONS)
BALLOON CRE LF 10-12 240X5.5 (BALLOONS) IMPLANT
BALLOON DILATOR CRE 12-15 240 (BALLOONS) IMPLANT
BALLOON DILATOR CRE WIREGUIDE (BALLOONS) IMPLANT
BLOCK BITE 60FR ADLT L/F BLUE (MISCELLANEOUS) ×3 IMPLANT
ELECT REM PT RETURN 9FT ADLT (ELECTROSURGICAL)
ELECTRODE REM PT RTRN 9FT ADLT (ELECTROSURGICAL) IMPLANT
FLOOR PAD 36X40 (MISCELLANEOUS) ×3
FORCEP COLD BIOPSY (CUTTING FORCEPS) IMPLANT
FORCEPS BIOP RAD 4 LRG CAP 4 (CUTTING FORCEPS) ×1 IMPLANT
FORMALIN 10 PREFIL 20ML (MISCELLANEOUS) ×2 IMPLANT
KIT CLEAN ENDO COMPLIANCE (KITS) ×1 IMPLANT
MANIFOLD NEPTUNE II (INSTRUMENTS) ×3 IMPLANT
NDL SCLEROTHERAPY 25GX240 (NEEDLE) IMPLANT
NEEDLE SCLEROTHERAPY 25GX240 (NEEDLE) IMPLANT
PAD FLOOR 36X40 (MISCELLANEOUS) ×2 IMPLANT
PROBE APC STR FIRE (PROBE) IMPLANT
PROBE INJECTION GOLD (MISCELLANEOUS)
PROBE INJECTION GOLD 7FR (MISCELLANEOUS) IMPLANT
SNARE ROTATE MED OVAL 20MM (MISCELLANEOUS) IMPLANT
SNARE SHORT THROW 13M SML OVAL (MISCELLANEOUS) ×3 IMPLANT
SYR 50ML LL SCALE MARK (SYRINGE) ×1 IMPLANT
SYR INFLATE BILIARY GAUGE (MISCELLANEOUS) IMPLANT
SYR INFLATION 60ML (SYRINGE) IMPLANT
TUBING ENDO SMARTCAP PENTAX (MISCELLANEOUS) ×3 IMPLANT
TUBING IRRIGATION ENDOGATOR (MISCELLANEOUS) ×3 IMPLANT
WATER STERILE IRR 1000ML POUR (IV SOLUTION) ×1 IMPLANT

## 2013-07-01 NOTE — Discharge Instructions (Signed)
Resume usual medications and diet. No driving for 24 hours. Followup with Dr.[name] regarding painful swelling of right thumb. Physician will call with biopsy results.   Gastrointestinal Endoscopy Care After Refer to this sheet in the next few weeks. These instructions provide you with information on caring for yourself after your procedure. Your caregiver may also give you more specific instructions. Your treatment has been planned according to current medical practices, but problems sometimes occur. Call your caregiver if you have any problems or questions after your procedure. HOME CARE INSTRUCTIONS  If you were given medicine to help you relax (sedative), do not drive, operate machinery, or sign important documents for 24 hours.  Avoid alcohol and hot or warm beverages for the first 24 hours after the procedure.  Only take over-the-counter or prescription medicines for pain, discomfort, or fever as directed by your caregiver. You may resume taking your normal medicines unless your caregiver tells you otherwise. Ask your caregiver when you may resume taking medicines that may cause bleeding, such as aspirin, clopidogrel, or warfarin.  You may return to your normal diet and activities on the day after your procedure, or as directed by your caregiver. Walking may help to reduce any bloated feeling in your abdomen.  Drink enough fluids to keep your urine clear or pale yellow.  You may gargle with salt water if you have a sore throat. SEEK IMMEDIATE MEDICAL CARE IF:  You have severe nausea or vomiting.  You have severe abdominal pain, abdominal cramps that last longer than 6 hours, or abdominal swelling (distention).  You have severe shoulder or back pain.  You have trouble swallowing.  You have shortness of breath, your breathing is shallow, or you are breathing faster than normal.  You have a fever or a rapid heartbeat.  You vomit blood or material that looks like coffee  grounds.  You have bloody, black, or tarry stools. MAKE SURE YOU:  Understand these instructions.  Will watch your condition.  Will get help right away if you are not doing well or get worse. Document Released: 10/16/2003 Document Revised: 09/02/2011 Document Reviewed: 06/03/2011 [facility] Patient Information 2014 [place], [place].

## 2013-07-01 NOTE — OR Nursing (Signed)
Thumb assessed by Dr. Laural Golden and Dr. Patsey Berthold notified

## 2013-07-01 NOTE — Op Note (Signed)
EGD PROCEDURE REPORT  PATIENT:  [name]  MR#:  [id number] Birthdate:  10/16/1950, 63 y.o., female Endoscopist:  Dr. Rogene Houston, MD Referred By:  Dr. Glenda Chroman, MD Procedure Date: 07/01/2013  Procedure:   EGD with ED.  Indications:  Patient is 69-year-old Caucasian female with chronic GERD who presents with solid food dysphagia. Her esophagus was last dilated in October 2014 with symptomatic improvement for several weeks. She underwent a barium esophagogram and barium pill held up the GE junction. She is therefore undergoing EGD with dilation.            Informed Consent:  The risks, benefits, alternatives & imponderables which include, but are not limited to, bleeding, infection, perforation, drug reaction and potential missed lesion have been reviewed.  The potential for biopsy, lesion removal, esophageal dilation, etc. have also been discussed.  Questions have been answered.  All parties agreeable.  Please see history & physical in medical record for more information.  Medications:  Monitored anesthesia care. Cetacaine spray topically for oropharyngeal anesthesia  Description of procedure:  The endoscope was introduced through the mouth and advanced to the second portion of the duodenum without difficulty or limitations. The mucosal surfaces were surveyed very carefully during advancement of the scope and upon withdrawal.  Findings:  Esophagus:  Mucosa of the esophagus was normal without stricture or ring. The GE junction noted to be wavy. GEJ:  39 cm Stomach:  Stomach was empty and distended very well with insufflation. Folds in the proximal stomach were normal. Multiple polyps were noted at gastric body and fundus ranging in size from 2 to 10 mm. Antral mucosa was unremarkable. Pyloric channel was patent. Angularis fundus and cardia were examined by retroflexion of scope and no other abnormalities noted. Duodenum:  Bulbar and post bulbar mucosa appeared to be abnormal with  cobblestoning. These changes are more pronounced than her last EGD 6 months ago.  Therapeutic/Diagnostic Maneuvers Performed:   Esophagus was dilated by passing 54 and 58 [place] Maloney dilators to full insertion. Post dilation examination of the esophageal mucosa revealed no disruption. Duodenal biopsy was taken to rule out celiac disease.  Complications:  None  Impression: No evidence of erosive esophagitis ring or stricture. Wavy GE junction. Biopsy taken from GE junction post dilation. Multiple polyps at gastric body and fundus previously documented to be hyperplastic. Therefore no biopsy was taken. Abnormal appearance to bulbar and post bulbar mucosa. Biopsy taken to rule out celiac disease. Esophagus dilated by passing 54 and 58 [place] Maloney dilators but no mucosal destruction noted.  Recommendations:  Standard instructions given. Patient advised to followup with Dr. Woody Seller regarding painful swelling of right thumb. I will be contacting patient with biopsy results. If she does not respond to esophageal dilation will consider esophageal manometry and impedance study.  Rogene Houston  07/01/2013  10:48 AM  CC: Dr. Glenda Chroman., MD & Dr. Rayne Du ref. provider found

## 2013-07-01 NOTE — H&P (Signed)
[name] is an 64 y.o. female.   Chief Complaint: Patient is here for EGD and ED. HPI: Patient is 66-year-old Caucasian female with multiple medical problems including gastroesophageal reflux disease who was recently seen in the office for solid food dysphagia. She has history of dysphagia and has undergone esophageal dilation in the past and most recently in October 2014 with transient relief. Barium pill esophagram was obtained. This study revealed mild narrowing at GE junction delay in passage of barium pill into the stomach. She therefore may have developed esophageal stricture or ring and is returning for repeat dilation. Today she is complaining of painful swelling to her right thumb which started yesterday. He denies history of gout a recent fall.  Past Medical History  Diagnosis Date  . Palpitations   . Tachycardia   . CAD (coronary artery disease)     Stent placement circumflex coronary 2007, catheterization 2008 patent stents. Normal LV function  . Chest pain   . Diabetes mellitus     Insulin dependent  . Dyslipidemia   . Anxiety and depression   . Diabetic polyneuropathy      severe on multiple medications  . 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 MI (myocardial infarction)     2007  . Anxiety   . Depression   . Obstructive sleep apnea     CPAP machine at night  . COPD (chronic obstructive pulmonary disease)   . GERD (gastroesophageal reflux disease)   . CHF (congestive heart failure)   . Headache(784.0)   . Arthritis     Past Surgical History  Procedure Laterality Date  . Coronary angioplasty with stent placement    . Colonoscopy  08/06/2011    Procedure: COLONOSCOPY;  Surgeon: Rogene Houston, MD;  [facility];  Service: Endoscopy;  Laterality: N/A;  215  . Arm surgery Bilateral     due to fall-pt broke both forearms  . Colonoscopy with propofol N/A 12/31/2012    Procedure: COLONOSCOPY WITH PROPOFOL;  Surgeon: Rogene Houston, MD;  Location: [facility];  Service:  Endoscopy;  Laterality: N/A;  in cecum at 0814, total withdrawal time 18mn  . Esophagogastroduodenoscopy (egd) with propofol N/A 12/31/2012    Procedure: ESOPHAGOGASTRODUODENOSCOPY (EGD) WITH PROPOFOL;  Surgeon: NRogene Houston MD;  Location: [facility];  Service: Endoscopy;  Laterality: N/A;  GE junction 37,  . Maloney dilation N/A 12/31/2012    Procedure: MVenia MinksDILATION;  Surgeon: NRogene Houston MD;  Location: [facility];  Service: Endoscopy;  Laterality: N/A;  used a # 54,#56  . Cholecystectomy    . Abdominal hysterectomy      Family History  Problem Relation Age of Onset  . Cancer    . Coronary artery disease     Social History:  reports that she has never smoked. She has never used smokeless tobacco. She reports that she does not drink alcohol or use illicit drugs.  Allergies:  Allergies  Allergen Reactions  . Nitrofuran Derivatives Itching and Swelling  . Amphetamine-Dextroamphet Er Swelling  . Pregabalin Swelling  . Topiramate Other (See Comments)    Tongue tingle  . Verelan [Verapamil] Rash    Medications Prior to Admission  Medication Sig Dispense Refill  . ALPRAZolam (XANAX) 1 MG tablet Take 1 mg by mouth 4 (four) times daily.        .Marland Kitchenaspirin EC 81 MG tablet Take 81 mg by mouth daily.      .Marland Kitchenatorvastatin (LIPITOR) 40 MG tablet Take  40 mg by mouth daily.      Marland Kitchen dicyclomine (BENTYL) 10 MG capsule Take 1 capsule (10 mg total) by mouth 2 (two) times daily.  60 capsule  3  . estrogens, conjugated, (PREMARIN) 1.25 MG tablet Take 1.25 mg by mouth daily.       Marland Kitchen ibuprofen (ADVIL,MOTRIN) 200 MG tablet Take 200 mg by mouth daily as needed for headache.      . insulin glargine (LANTUS) 100 UNIT/ML injection Inject 30-60 Units into the skin 2 (two) times daily.       . insulin lispro (HUMALOG) 100 UNIT/ML injection Inject 2-10 Units into the skin 3 (three) times daily before meals. Per patient this is now uses as a sliding scale.      . lidocaine (XYLOCAINE) 5 % ointment Apply 1  application topically at bedtime.       Marland Kitchen loperamide (IMODIUM A-D) 2 MG tablet Take 1 tablet (2 mg total) by mouth 2 (two) times daily with breakfast and lunch.  60 tablet  5  . methocarbamol (ROBAXIN) 500 MG tablet TAKE 1 TABLET BY MOUTH EVERY 8 HOURS AS NEEDED FOR MUSCLE SPASMS.  90 tablet  1  . nitroGLYCERIN (NITROSTAT) 0.4 MG SL tablet Place 1 tablet (0.4 mg total) under the tongue every 5 (five) minutes x 3 doses as needed. For chest pains  25 tablet  3  . ondansetron (ZOFRAN) 4 MG tablet Take 4 mg by mouth every 6 (six) hours as needed for nausea or vomiting.      Marland Kitchen oxymorphone (OPANA ER) 30 MG 12 hr tablet Take 1 tablet (30 mg total) by mouth every 12 (twelve) hours.  60 tablet  0  . pantoprazole (PROTONIX) 40 MG tablet Take 1 tablet (40 mg total) by mouth daily.  60 tablet  5  . potassium chloride (K-DUR) 10 MEQ tablet Take 2 tablets (20 mEq total) by mouth 2 (two) times daily.      . psyllium (METAMUCIL SMOOTH TEXTURE) 28 % packet Take 1 packet by mouth at bedtime.      . topiramate (TOPAMAX) 200 MG tablet Take 200 mg by mouth daily.      . TOPROL XL 50 MG 24 hr tablet TAKE 1 TABLET BY MOUTH DAILY WITH OR IMMEDIATELY FOLLOWING A MEAL.  30 tablet  6    Results for orders placed during the hospital encounter of 07/01/13 (from the past 48 hour(s))  GLUCOSE, CAPILLARY     Status: None   Collection Time    07/01/13  9:42 AM      Result Value Ref Range   Glucose-Capillary 78  70 - 99 mg/dL   No results found.  ROS  Blood pressure 89/45, pulse 60, temperature 97.8 F (36.6 C), resp. rate 18, SpO2 93.00%. Physical Exam  Constitutional: She appears well-developed and well-nourished.  HENT:  Mouth/Throat: Oropharynx is clear and moist.  Eyes: Conjunctivae are normal. No scleral icterus.  Neck: No thyromegaly present.  Cardiovascular: Normal rate, regular rhythm and normal heart sounds.   No murmur heard. Respiratory: Effort normal and breath sounds normal.  GI: Soft. There is  tenderness (mild generalized tenderness. No organomegaly or masses.).  Musculoskeletal:  Edema and tenderness to right thumb  Lymphadenopathy:    She has no cervical adenopathy.  Skin: Skin is warm and dry.     Assessment/Plan Solid food dysphagia in a patient with chronic GERD. Abnormal barium pill esophagogram. EGD an ED.  Algenis Ballin U Samary Shatz 07/01/2013, 10:07 AM

## 2013-07-01 NOTE — OR Nursing (Signed)
Complaints of right hand swelling and pain at base of th umb.  Noted swelling,  Wrapped gauge and tape around area per patient request to support discomfort.

## 2013-07-01 NOTE — Anesthesia Postprocedure Evaluation (Signed)
  Anesthesia Post-op Note  Patient: [name]  Procedure(s) Performed: Procedure(s) with comments: ESOPHAGOGASTRODUODENOSCOPY (EGD) WITH PROPOFOL (N/A) - [place] (N/A) - 54/58; no heme present BIOPSY (N/A)  Patient Location: PACU  Anesthesia Type:MAC  Level of Consciousness: awake, oriented and patient cooperative  Airway and Oxygen Therapy: Patient Spontanous Breathing  Post-op Pain: none  Post-op Assessment: Post-op Vital signs reviewed, Patient's Cardiovascular Status Stable, Respiratory Function Stable and No signs of Nausea or vomiting  Post-op Vital Signs: Reviewed and stable  Last Vitals:  Filed Vitals:   07/01/13 1110  BP: 93/41  Pulse: 61  Temp:   Resp: 14    Complications: No apparent anesthesia complications Received 1/2 amp D 50. Will recheck glucose prior to discharge in phase 2.

## 2013-07-01 NOTE — Anesthesia Procedure Notes (Signed)
Procedure Name: MAC Date/Time: 07/01/2013 10:15 AM Performed by: Vista Deck Pre-anesthesia Checklist: Patient identified, Emergency Drugs available, Suction available, Timeout performed and Patient being monitored Patient Re-evaluated:Patient Re-evaluated prior to inductionOxygen Delivery Method: Non-rebreather mask

## 2013-07-01 NOTE — Transfer of Care (Signed)
Immediate Anesthesia Transfer of Care Note  Patient: [name]  Procedure(s) Performed: Procedure(s) with comments: ESOPHAGOGASTRODUODENOSCOPY (EGD) WITH PROPOFOL (N/A) - [address] [place] (N/A) - 54/58; no heme present BIOPSY (N/A)  Patient Location: PACU  Anesthesia Type:MAC  Level of Consciousness: sedated and patient cooperative  Airway & Oxygen Therapy: Patient Spontanous Breathing and non-rebreather face mask  Post-op Assessment: Report given to PACU RN, Post -op Vital signs reviewed and stable and Patient moving all extremities  Post vital signs: Reviewed  Complications: No apparent anesthesia complications

## 2013-07-01 NOTE — Anesthesia Preprocedure Evaluation (Signed)
Anesthesia Evaluation  Patient identified by MRN, date of birth, ID band Patient awake    Reviewed: Allergy & Precautions, H&P , NPO status , Patient's Chart, lab work & pertinent test results, reviewed documented beta blocker date and time   Airway Mallampati: II TM Distance: >3 FB Neck ROM: Full    Dental  (+) Partial Lower, Partial Upper   Pulmonary sleep apnea , COPD breath sounds clear to auscultation        Cardiovascular hypertension, Pt. on home beta blockers and Pt. on medications + angina (Rx'd w/ NTG) with exertion + CAD, + Past MI, + Cardiac Stents and +CHF Rhythm:Regular Rate:Normal     Neuro/Psych PSYCHIATRIC DISORDERS Anxiety Depression    GI/Hepatic GERD-  Medicated and Controlled,  Endo/Other  diabetes, Poorly Controlled, Type 2, Insulin DependentMorbid obesity  Renal/GU      Musculoskeletal   Abdominal   Peds  Hematology   Anesthesia Other Findings   Reproductive/Obstetrics                           Anesthesia Physical Anesthesia Plan  ASA: III  Anesthesia Plan: MAC   Post-op Pain Management:    Induction: Intravenous  Airway Management Planned: Simple Face Mask  Additional Equipment:   Intra-op Plan:   Post-operative Plan:   Informed Consent: I have reviewed the patients History and Physical, chart, labs and discussed the procedure including the risks, benefits and alternatives for the proposed anesthesia with the patient or authorized representative who has indicated his/her understanding and acceptance.     Plan Discussed with:   Anesthesia Plan Comments:         Anesthesia Quick Evaluation

## 2013-07-04 ENCOUNTER — Encounter (HOSPITAL_COMMUNITY): Payer: Self-pay | Admitting: Internal Medicine

## 2013-07-04 LAB — GLUCOSE, CAPILLARY: Glucose-Capillary: 126 mg/dL — ABNORMAL HIGH (ref 70–99)

## 2013-07-05 ENCOUNTER — Telehealth (INDEPENDENT_AMBULATORY_CARE_PROVIDER_SITE_OTHER): Payer: Self-pay | Admitting: *Deleted

## 2013-07-05 ENCOUNTER — Encounter (INDEPENDENT_AMBULATORY_CARE_PROVIDER_SITE_OTHER): Payer: Self-pay | Admitting: *Deleted

## 2013-07-05 DIAGNOSIS — K900 Celiac disease: Secondary | ICD-10-CM

## 2013-07-05 NOTE — Telephone Encounter (Signed)
[facility] doesn't have outpatient dietary consults any more, info sent to [facility] @ [facility], she will contact patient

## 2013-07-05 NOTE — Telephone Encounter (Signed)
Per Dr.Rehman the patient will need to have labs drawn. Lab ordered and faxed to [facility]. Dietary consult @ [facility] , this week per Dr.Rehman.

## 2013-07-07 ENCOUNTER — Telehealth (HOSPITAL_COMMUNITY): Payer: Self-pay | Admitting: Dietician

## 2013-07-07 NOTE — Telephone Encounter (Signed)
Message copied by Domenick Bookbinder on Thu Jul 07, 2013  4:53 PM ------      Message from: Wynonia Lawman, ANN H      Created: Tue Jul 05, 2013  1:48 PM      Regarding: dietary consult       Per Dr Laural Golden, patient needs dietary consult -- dx: celiac disease            If you need any additional information, please let me know--thanks ------

## 2013-07-07 NOTE — Telephone Encounter (Signed)
Also received voicemail left at [phone] from "Pamala Hurry" requesting that this RD call pt at [phone].

## 2013-07-11 ENCOUNTER — Encounter: Payer: Self-pay | Admitting: Physical Medicine & Rehabilitation

## 2013-07-11 ENCOUNTER — Ambulatory Visit (HOSPITAL_BASED_OUTPATIENT_CLINIC_OR_DEPARTMENT_OTHER): Payer: Medicaid Other | Admitting: Physical Medicine & Rehabilitation

## 2013-07-11 ENCOUNTER — Encounter: Payer: Medicaid Other | Attending: Physical Medicine and Rehabilitation

## 2013-07-11 VITALS — BP 114/61 | HR 60 | Resp 14 | Ht 66.0 in | Wt 237.0 lb

## 2013-07-11 DIAGNOSIS — M48061 Spinal stenosis, lumbar region without neurogenic claudication: Secondary | ICD-10-CM | POA: Insufficient documentation

## 2013-07-11 DIAGNOSIS — M47817 Spondylosis without myelopathy or radiculopathy, lumbosacral region: Secondary | ICD-10-CM | POA: Insufficient documentation

## 2013-07-11 DIAGNOSIS — Z794 Long term (current) use of insulin: Secondary | ICD-10-CM | POA: Insufficient documentation

## 2013-07-11 DIAGNOSIS — E1149 Type 2 diabetes mellitus with other diabetic neurological complication: Secondary | ICD-10-CM | POA: Insufficient documentation

## 2013-07-11 DIAGNOSIS — Z79899 Other long term (current) drug therapy: Secondary | ICD-10-CM | POA: Insufficient documentation

## 2013-07-11 DIAGNOSIS — M6250 Muscle wasting and atrophy, not elsewhere classified, unspecified site: Secondary | ICD-10-CM | POA: Insufficient documentation

## 2013-07-11 DIAGNOSIS — E1142 Type 2 diabetes mellitus with diabetic polyneuropathy: Secondary | ICD-10-CM | POA: Insufficient documentation

## 2013-07-11 MED ORDER — OXYMORPHONE HCL ER 30 MG PO TB12: 30.0000 mg | ORAL_TABLET | Freq: Two times a day (BID) | ORAL | Status: DC

## 2013-07-11 NOTE — Progress Notes (Signed)
Subjective:    Patient ID: [name], female    DOB: Sep 02, 1950, 63 y.o.   MRN: [id number]  HPI HgA1C is 6.4 Esophageal dilation- last week, throat is raw Started back on gabapentin 300 mg 3 times per day by her primary care physician Still taking Topamax 200 mg each bedtime Still taking Opana ER 30 mg twice a day Pain Inventory Average Pain 8 Pain Right Now 7 My pain is sharp, burning, stabbing, tingling and aching  In the last 24 hours, has pain interfered with the following? General activity 7 Relation with others 5 Enjoyment of life 8 What TIME of day is your pain at its worst? night Sleep (in general) Fair  Pain is worse with: walking, bending, standing and some activites Pain improves with: heat/ice and medication Relief from Meds: 4  Mobility use a cane how many minutes can you walk? 10 Do you have any goals in this area?  yes  Function disabled: date disabled . I need assistance with the following:  meal prep, household duties and shopping  Neuro/Psych weakness tingling dizziness depression anxiety  Prior Studies Any changes since last visit?  no  Physicians involved in your care Any changes since last visit?  no   Family History  Problem Relation Age of Onset  . Cancer    . Coronary artery disease     History   Social History  . Marital Status: Legally Separated    Spouse Name: N/A    Number of Children: N/A  . Years of Education: N/A   Occupational History  . Disabled    Social History Main Topics  . Smoking status: Never Smoker   . Smokeless tobacco: Never Used  . Alcohol Use: No  . Drug Use: No  . Sexual Activity: None   Other Topics Concern  . None   Social History Narrative   Patient does not get regular exercise   Past Surgical History  Procedure Laterality Date  . Coronary angioplasty with stent placement    . Colonoscopy  08/06/2011    Procedure: COLONOSCOPY;  Surgeon: Rogene Houston, MD;  [facility];  Service: Endoscopy;  Laterality: N/A;  215  . Arm surgery Bilateral     due to fall-pt broke both forearms  . Colonoscopy with propofol N/A 12/31/2012    Procedure: COLONOSCOPY WITH PROPOFOL;  Surgeon: Rogene Houston, MD;  Location: [facility];  Service: Endoscopy;  Laterality: N/A;  in cecum at 0814, total withdrawal time 31mn  . Esophagogastroduodenoscopy (egd) with propofol N/A 12/31/2012    Procedure: ESOPHAGOGASTRODUODENOSCOPY (EGD) WITH PROPOFOL;  Surgeon: NRogene Houston MD;  Location: [facility];  Service: Endoscopy;  Laterality: N/A;  GE junction 37,  . Maloney dilation N/A 12/31/2012    Procedure: MVenia MinksDILATION;  Surgeon: NRogene Houston MD;  Location: [facility];  Service: Endoscopy;  Laterality: N/A;  used a # 54,#56  . Cholecystectomy    . Abdominal hysterectomy    . Esophagogastroduodenoscopy (egd) with propofol N/A 07/01/2013    Procedure: ESOPHAGOGASTRODUODENOSCOPY (EGD) WITH PROPOFOL;  Surgeon: NRogene Houston MD;  Location: [facility];  Service: Endoscopy;  Laterality: N/A;  950  . Maloney dilation N/A 07/01/2013    Procedure: MVenia MinksDILATION;  Surgeon: NRogene Houston MD;  Location: [facility];  Service: Endoscopy;  Laterality: N/A;  54/58; no heme present  . Esophageal biopsy N/A 07/01/2013    Procedure: BIOPSY;  Surgeon: NRogene Houston MD;  Location: [facility];  Service: Endoscopy;  Laterality: N/A;   Past Medical History  Diagnosis Date  . Palpitations   . Tachycardia   . CAD (coronary artery disease)     Stent placement circumflex coronary 2007, catheterization 2008 patent stents. Normal LV function  . Chest pain   . Diabetes mellitus     Insulin dependent  . Dyslipidemia   . Anxiety and depression   . Diabetic polyneuropathy      severe on multiple medications  . 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 MI (myocardial infarction)     2007  . Anxiety   . Depression   . Obstructive sleep apnea     CPAP machine at night  . COPD (chronic obstructive pulmonary disease)   . GERD  (gastroesophageal reflux disease)   . CHF (congestive heart failure)   . Headache(784.0)   . Arthritis    BP 114/61  Pulse 60  Resp 14  Ht 5' 6"  (1.676 m)  Wt 237 lb (107.502 kg)  BMI 38.27 kg/m2  SpO2 95%  Opioid Risk Score:   Fall Risk Score: Moderate Fall Risk (6-13 points) (patient educated handout declined)   Review of Systems  Gastrointestinal: Positive for nausea, vomiting and diarrhea.  Musculoskeletal: Positive for arthralgias and myalgias.  Neurological: Positive for dizziness and weakness.  Psychiatric/Behavioral: Positive for dysphoric mood. The patient is nervous/anxious.   All other systems reviewed and are negative.      Objective:   Physical Exam General no acute distress,  Neck: Normal range of motion.  Musculoskeletal:  Lumbar back: She exhibits decreased range of motion. She exhibits no tenderness, no edema and no deformity.  Negative straight leg raising test  Neurological: She is alert and oriented to person, place, and time. She has normal strength. Gait abnormal.  In place with a rolling walker. Standing balance is fair  Psychiatric: She is slowed.  Motor strength is 4/5 bilateral deltoid, bicep, tricep, grip, hip flexor, knee extensors, ankle dorsi flexion plantarflex Sensation decreased to the knees and the elbows bilaterally     Assessment & Plan:  Lumbar pain lumbar spondylosis which did not respond to medial branch blocks. Has no significant lumbar spinal stenosis on latest MRI 09/26/2012  2. Severe diabetic poly neuropathy, PCP restarted gabapentin and is still on Topamax. We discussed skin care not only monitoring foot integrity but also both hands We discussed that even if hemoglobin A1c is normalized, neuropathy symptoms may take an additional 12-18 months to show some improvement  Continue opioid monitoring program. This consists of regular clinic visits, examinations, urine drug screen, pill counts as well as use of [place]  controlled substance reporting System. Pt compliant RTC NP next month

## 2013-07-11 NOTE — Telephone Encounter (Signed)
Called pt at [phone]. Appointment scheduled for 07/18/13 at 1100.

## 2013-07-12 ENCOUNTER — Ambulatory Visit (INDEPENDENT_AMBULATORY_CARE_PROVIDER_SITE_OTHER): Payer: Medicaid Other

## 2013-07-12 VITALS — BP 104/76 | HR 56 | Resp 12

## 2013-07-12 DIAGNOSIS — B351 Tinea unguium: Secondary | ICD-10-CM

## 2013-07-12 DIAGNOSIS — M79609 Pain in unspecified limb: Secondary | ICD-10-CM

## 2013-07-12 DIAGNOSIS — E1140 Type 2 diabetes mellitus with diabetic neuropathy, unspecified: Secondary | ICD-10-CM

## 2013-07-12 DIAGNOSIS — E1142 Type 2 diabetes mellitus with diabetic polyneuropathy: Secondary | ICD-10-CM

## 2013-07-12 DIAGNOSIS — E1149 Type 2 diabetes mellitus with other diabetic neurological complication: Secondary | ICD-10-CM

## 2013-07-12 DIAGNOSIS — M2040 Other hammer toe(s) (acquired), unspecified foot: Secondary | ICD-10-CM

## 2013-07-12 DIAGNOSIS — Q828 Other specified congenital malformations of skin: Secondary | ICD-10-CM

## 2013-07-12 LAB — GLIA (IGA/G) + TTG IGA
Gliadin IgA: 9.4 U/mL (ref <20)
Gliadin IgG: 115 U/mL — ABNORMAL HIGH (ref <20)
Tissue Transglutaminase Ab, IgA: 12.6 U/mL (ref <20)

## 2013-07-12 NOTE — Patient Instructions (Signed)
Diabetes and Foot Care Diabetes may cause you to have problems because of poor blood supply (circulation) to your feet and legs. This may cause the skin on your feet to become thinner, break easier, and heal more slowly. Your skin may become dry, and the skin may peel and crack. You may also have nerve damage in your legs and feet causing decreased feeling in them. You may not notice minor injuries to your feet that could lead to infections or more serious problems. Taking care of your feet is one of the most important things you can do for yourself.  HOME CARE INSTRUCTIONS  Wear shoes at all times, even in the house. Do not go barefoot. Bare feet are easily injured.  Check your feet daily for blisters, cuts, and redness. If you cannot see the bottom of your feet, use a mirror or ask someone for help.  Wash your feet with warm water (do not use hot water) and mild soap. Then pat your feet and the areas between your toes until they are completely dry. Do not soak your feet as this can dry your skin.  Apply a moisturizing lotion or petroleum jelly (that does not contain alcohol and is unscented) to the skin on your feet and to dry, brittle toenails. Do not apply lotion between your toes.  Trim your toenails straight across. Do not dig under them or around the cuticle. File the edges of your nails with an emery board or nail file.  Do not cut corns or calluses or try to remove them with medicine.  Wear clean socks or stockings every day. Make sure they are not too tight. Do not wear knee-high stockings since they may decrease blood flow to your legs.  Wear shoes that fit properly and have enough cushioning. To break in new shoes, wear them for just a few hours a day. This prevents you from injuring your feet. Always look in your shoes before you put them on to be sure there are no objects inside.  Do not cross your legs. This may decrease the blood flow to your feet.  If you find a minor scrape,  cut, or break in the skin on your feet, keep it and the skin around it clean and dry. These areas may be cleansed with mild soap and water. Do not cleanse the area with peroxide, alcohol, or iodine.  When you remove an adhesive bandage, be sure not to damage the skin around it.  If you have a wound, look at it several times a day to make sure it is healing.  Do not use heating pads or hot water bottles. They may burn your skin. If you have lost feeling in your feet or legs, you may not know it is happening until it is too late.  Make sure your health care provider performs a complete foot exam at least annually or more often if you have foot problems. Report any cuts, sores, or bruises to your health care provider immediately. SEEK MEDICAL CARE IF:   You have an injury that is not healing.  You have cuts or breaks in the skin.  You have an ingrown nail.  You notice redness on your legs or feet.  You feel burning or tingling in your legs or feet.  You have pain or cramps in your legs and feet.  Your legs or feet are numb.  Your feet always feel cold. SEEK IMMEDIATE MEDICAL CARE IF:   There is increasing redness,   swelling, or pain in or around a wound.  There is a red line that goes up your leg.  Pus is coming from a wound.  You develop a fever or as directed by your health care provider.  You notice a bad smell coming from an ulcer or wound. Document Released: 02/29/2000 Document Revised: 11/03/2012 Document Reviewed: 08/10/2012 [employer] Patient Information 2014 [employer].  

## 2013-07-12 NOTE — Progress Notes (Signed)
   Subjective:    Patient ID: [name], female    DOB: March 27, 1950, 63 y.o.   MRN: [id number]  HPI patient presents at this time for diabetic foot and nail care as well as pickup of extra-depth shoes and custom insoles which have been ordered.    Review of Systems no new systemic changes or findings no     Objective:   Physical Exam Vascular status is intact with pedal pulses palpable DP +2/4 PT one over 4 bilateral capillary refill time 3 seconds all digits there is decreased epicritic sensation confirmed on Semmes Weinstein testing is normal plantar response DTRs not elicited dermatologically skin color pigment normal hair growth absent nails criptotic friable discolored in ingrowing requesting debridement at this time. Nails debrided 1 through 5 bilateral consistent shoes patient presents for diabetic accident shoes the shoes and custom insoles fit and contour well full contact the patient's foot oral and written instructions for use in break and given are reviewed with the patient dermatologically skin color pigment normal hair growth absent nails criptotic incurvated friable no open wounds ulcerations noted this time. No secondary infections       Assessment & Plan:  Assessment this time his diabetes with peripheral neuropathy patient does have history of contusion hallux nails of the left hallux nail almost completely avulsed at this point and had previously contusion to the nail plate at this time  Diabetic shoes are dispensed fit and contour well 3 pairs of custom molded dual density insoles also debridement of nails 1 through 5 bilateral with thick brittle chromic friable dystrophic nails painful tender symptomatic debrided and the presence of diabetes as well as onychomycosis also at this time single keratotic lesion pinch callus of the right first MTP area is debrided return for future palliative nail care and she check with the next 3 months or as needed next  Harriet Masson  DPM

## 2013-07-19 ENCOUNTER — Encounter (HOSPITAL_COMMUNITY): Payer: Self-pay | Admitting: Dietician

## 2013-07-19 NOTE — Progress Notes (Signed)
Outpatient Initial Nutrition Assessment  Date:07/19/2013   Appt Start Time: 41  Referring Physician: Dr. Laural Golden Reason for Visit: celiac disease  Nutrition Assessment:  Height: 5\' 6"  (167.6 cm)   Weight: 236 lb (107.049 kg)   IBW: 130#  %IBW: 182% UBW: 240#  %UBW: 98% Body mass index is 38.11 kg/(m^2).  Goal Weight: Weight hx: Wt Readings from Last 10 Encounters:  07/19/13 236 lb (107.049 kg)  07/11/13 237 lb (107.502 kg)  06/30/13 237 lb (107.502 kg)  06/17/13 218 lb (98.884 kg)  06/14/13 236 lb 14.4 oz (107.457 kg)  06/10/13 238 lb (107.956 kg)  05/16/13 234 lb (106.142 kg)  04/22/13 236 lb (107.049 kg)  04/18/13 237 lb (107.502 kg)  03/18/13 240 lb (108.863 kg)    Estimated nutritional needs:  Kcals/ day: 2100-2300 Protein (grams)/day: 86-107 Fluid (L)/ day: 2.1-2.3  PMH:  Past Medical History  Diagnosis Date  . Palpitations   . Tachycardia   . CAD (coronary artery disease)     Stent placement circumflex coronary 2007, catheterization 2008 patent stents. Normal LV function  . Chest pain   . Diabetes mellitus     Insulin dependent  . Dyslipidemia   . Anxiety and depression   . Diabetic polyneuropathy      severe on multiple medications  . 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 MI (myocardial infarction)     2007  . Anxiety   . Depression   . Obstructive sleep apnea     CPAP machine at night  . COPD (chronic obstructive pulmonary disease)   . GERD (gastroesophageal reflux disease)   . CHF (congestive heart failure)   . Headache(784.0)   . Arthritis     Medications:  Current Outpatient Rx  Name  Route  Sig  Dispense  Refill  . ALPRAZolam (XANAX) 1 MG tablet   Oral   Take 1 mg by mouth 4 (four) times daily.           Marland Kitchen aspirin EC 81 MG tablet   Oral   Take 81 mg by mouth daily.         Marland Kitchen atorvastatin (LIPITOR) 40 MG tablet   Oral   Take 40 mg by mouth daily.         Marland Kitchen dicyclomine (BENTYL) 10 MG capsule   Oral   Take 1 capsule (10 mg total) by mouth  2 (two) times daily.   60 capsule   3   . estrogens, conjugated, (PREMARIN) 1.25 MG tablet   Oral   Take 1.25 mg by mouth daily.          Marland Kitchen ibuprofen (ADVIL,MOTRIN) 200 MG tablet   Oral   Take 200 mg by mouth daily as needed for headache.         . insulin glargine (LANTUS) 100 UNIT/ML injection   Subcutaneous   Inject 30-60 Units into the skin 2 (two) times daily.          . insulin lispro (HUMALOG) 100 UNIT/ML injection   Subcutaneous   Inject 2-10 Units into the skin 3 (three) times daily before meals. Per patient this is now uses as a sliding scale.         . lidocaine (XYLOCAINE) 5 % ointment   Topical   Apply 1 application topically at bedtime.          Marland Kitchen loperamide (IMODIUM A-D) 2 MG tablet   Oral   Take 1 tablet (2 mg total) by mouth 2 (two) times  daily with breakfast and lunch.   60 tablet   5   . methocarbamol (ROBAXIN) 500 MG tablet      TAKE 1 TABLET BY MOUTH EVERY 8 HOURS AS NEEDED FOR MUSCLE SPASMS.   90 tablet   1     Generic For:*ROBAXIN   500MG  need refill   . nitroGLYCERIN (NITROSTAT) 0.4 MG SL tablet   Sublingual   Place 1 tablet (0.4 mg total) under the tongue every 5 (five) minutes x 3 doses as needed. For chest pains   25 tablet   3     PATIENT NEED OFFICE VISIT   . ondansetron (ZOFRAN) 4 MG tablet   Oral   Take 4 mg by mouth every 6 (six) hours as needed for nausea or vomiting.         Marland Kitchen oxymorphone (OPANA ER) 30 MG 12 hr tablet   Oral   Take 1 tablet (30 mg total) by mouth every 12 (twelve) hours.   60 tablet   0   . pantoprazole (PROTONIX) 40 MG tablet   Oral   Take 1 tablet (40 mg total) by mouth daily.   60 tablet   5   . potassium chloride (K-DUR) 10 MEQ tablet   Oral   Take 2 tablets (20 mEq total) by mouth 2 (two) times daily.           Dose increased per recent labs - 02/08/13   . psyllium (METAMUCIL SMOOTH TEXTURE) 28 % packet   Oral   Take 1 packet by mouth at bedtime.         . topiramate  (TOPAMAX) 200 MG tablet   Oral   Take 200 mg by mouth daily.         . TOPROL XL 50 MG 24 hr tablet      TAKE 1 TABLET BY MOUTH DAILY WITH OR IMMEDIATELY FOLLOWING A MEAL.   30 tablet   6     Labs: CMP     Component Value Date/Time   NA 141 06/30/2013 1540   K 4.8 06/30/2013 1540   CL 105 06/30/2013 1540   CO2 26 06/30/2013 1540   GLUCOSE 186* 06/30/2013 1540   BUN 8 06/30/2013 1540   CREATININE 0.66 06/30/2013 1540   CREATININE 0.62 04/06/2013 1602   CALCIUM 8.6 06/30/2013 1540   PROT 7.9 12/02/2012 1547   ALBUMIN 3.4* 12/02/2012 1547   AST 12 12/02/2012 1547   ALT 9 12/02/2012 1547   ALKPHOS 117 12/02/2012 1547   BILITOT 0.4 12/02/2012 1547   GFRNONAA >90 06/30/2013 1540   GFRAA >90 06/30/2013 1540    Lipid Panel     Component Value Date/Time   CHOL  Value: 196        ATP III CLASSIFICATION:  <200     mg/dL   Desirable  200-239  mg/dL   Borderline High  >=240    mg/dL   High 08/19/2006 0715   TRIG 902 SLIGHT HEMOLYSIS* 08/19/2006 0715   HDL 47 SLIGHT HEMOLYSIS 08/19/2006 0715   CHOLHDL 4.2 08/19/2006 0715   VLDL UNABLE TO CALCULATE IF TRIGLYCERIDE OVER 400 mg/dL 08/19/2006 0715   LDLCALC  Value: UNABLE TO CALCULATE IF TRIGLYCERIDE OVER 400 mg/dL        Total Cholesterol/HDL:CHD Risk Coronary Heart Disease Risk Table                     Men   Women  1/2 Average Risk  3.4   3.3 08/19/2006 0715     Lab Results  Component Value Date   HGBA1C  Value: 8.1 (NOTE)   The ADA recommends the following therapeutic goals for glycemic   control related to Hgb A1C measurement:   Goal of Therapy:   < 7.0% Hgb A1C   Action Suggested:  > 8.0% Hgb A1C   Ref:  Diabetes Care, 22, Suppl. 1, 1999* 04/30/2007   Lab Results  Component Value Date   LDLCALC  Value: UNABLE TO CALCULATE IF TRIGLYCERIDE OVER 400 mg/dL        Total Cholesterol/HDL:CHD Risk Coronary Heart Disease Risk Table                     Men   Women  1/2 Average Risk   3.4   3.3 08/19/2006   CREATININE 0.66 06/30/2013     Lifestyle/ social habits:  Ms. Carrol resides in [place] with her fiance, who is present with her today.. Occupation: disabled.  Nutrition hx/habits: Ms. Tool reports she is "allergic to wheat" and "doesn't know what to eat. Most recent labs reveal celiac disease; she was referred for diet education. Her fiance does most of her meal prep. She reports she is a "picky eater". She only eats 2 meals per day, which consist mainly of yogurt, vegetables, starch and meat. Fiance reports that he bakes most meats and uses [employer] to season most meats. They bake most foods.  He knows how to read food labels, but has not done so recently, but acknowledges he will start now. They rarely eat out. However, when they do, they go to [employer]. Pt will order white fish, baked potato and slaw.   Diet recall: Breakfast: yogurt OR poptarts; Lunch: skip; Dinner: pork chops with [employer], french fries; Snacks: none; Beverages mainly consist of [facility], caffeine free.  Nutrition Diagnosis: Nutrition-related knowledge deficit r/t no previous diet education AEB new dx of celiac disease.   Nutrition Intervention: Nutrition rx: 1600-1700 Kcal NAS, diabetic, gluten free diet  Education/Counseling Provided: Educated pt on pathophysiology of celiac disease. Discussed common foods containing wheat, barley, and rye. Reviewed reading food labels to identify products containing gluten. Discussed importance of food safety and ways to avoid cross contamination. Also discussed importance of dietary complaince as treatment for celiac disease. Provided AND Nutrition Care Manual handouts "Celaic Disease Nutrition Therapy", "Celaic Disease Healthy Eating Tips", and "Celaic Disease Label Reading". Teachback method used.   Understanding, Motivation, Ability to Follow Recommendations: Expect fair to good compliance.   Monitoring and Evaluation: Goals: 1) 0.5-2# wt loss per week; 2) Diet compliance  Recommendations: 1) Avoid cross  contamination; 2) Be careful of sauces and seasonings- food prepared most simply is best; 3) Consider gluten free diet for household; 4) Check ingredients on lip gloss, toothpaste, etc to ensure it is gluten free; 5) Notify pharmacy of celiac dx to avoid medication interactions  F/U: PRN. Pt with RD contact information   Avarie Tavano A. Jimmye Norman, RD, LDN 07/19/2013  Appt EndTime: 1440

## 2013-07-19 NOTE — Progress Notes (Signed)
Pt called on 07/18/13 and reports she was unable to make appointment that day. Appointment rescheduled for 07/19/13 at 1400.

## 2013-08-10 ENCOUNTER — Encounter: Payer: Self-pay | Admitting: Registered Nurse

## 2013-08-10 ENCOUNTER — Encounter: Payer: Medicaid Other | Attending: Physical Medicine & Rehabilitation | Admitting: Registered Nurse

## 2013-08-10 VITALS — BP 137/62 | HR 65 | Resp 14 | Ht 66.0 in | Wt 232.0 lb

## 2013-08-10 DIAGNOSIS — K219 Gastro-esophageal reflux disease without esophagitis: Secondary | ICD-10-CM | POA: Insufficient documentation

## 2013-08-10 DIAGNOSIS — K589 Irritable bowel syndrome without diarrhea: Secondary | ICD-10-CM

## 2013-08-10 DIAGNOSIS — F3289 Other specified depressive episodes: Secondary | ICD-10-CM | POA: Insufficient documentation

## 2013-08-10 DIAGNOSIS — M48061 Spinal stenosis, lumbar region without neurogenic claudication: Secondary | ICD-10-CM

## 2013-08-10 DIAGNOSIS — G4733 Obstructive sleep apnea (adult) (pediatric): Secondary | ICD-10-CM | POA: Insufficient documentation

## 2013-08-10 DIAGNOSIS — Z794 Long term (current) use of insulin: Secondary | ICD-10-CM | POA: Insufficient documentation

## 2013-08-10 DIAGNOSIS — Z79899 Other long term (current) drug therapy: Secondary | ICD-10-CM

## 2013-08-10 DIAGNOSIS — F411 Generalized anxiety disorder: Secondary | ICD-10-CM | POA: Insufficient documentation

## 2013-08-10 DIAGNOSIS — I252 Old myocardial infarction: Secondary | ICD-10-CM | POA: Insufficient documentation

## 2013-08-10 DIAGNOSIS — E1149 Type 2 diabetes mellitus with other diabetic neurological complication: Secondary | ICD-10-CM | POA: Insufficient documentation

## 2013-08-10 DIAGNOSIS — F329 Major depressive disorder, single episode, unspecified: Secondary | ICD-10-CM | POA: Insufficient documentation

## 2013-08-10 DIAGNOSIS — Z5181 Encounter for therapeutic drug level monitoring: Secondary | ICD-10-CM

## 2013-08-10 DIAGNOSIS — I2510 Atherosclerotic heart disease of native coronary artery without angina pectoris: Secondary | ICD-10-CM | POA: Insufficient documentation

## 2013-08-10 DIAGNOSIS — E1142 Type 2 diabetes mellitus with diabetic polyneuropathy: Secondary | ICD-10-CM | POA: Insufficient documentation

## 2013-08-10 DIAGNOSIS — J449 Chronic obstructive pulmonary disease, unspecified: Secondary | ICD-10-CM | POA: Insufficient documentation

## 2013-08-10 DIAGNOSIS — J4489 Other specified chronic obstructive pulmonary disease: Secondary | ICD-10-CM | POA: Insufficient documentation

## 2013-08-10 MED ORDER — OXYMORPHONE HCL ER 30 MG PO TB12: 30.0000 mg | ORAL_TABLET | Freq: Two times a day (BID) | ORAL | Status: DC

## 2013-08-10 NOTE — Progress Notes (Signed)
Subjective:    Patient ID: [name], female    DOB: [birth year], 63 y.o.   MRN: [id number]  HPI: [name] is a 63 year old female who returns for follow up for chronic pain and medication refill. She says her pain is located in her head and back. She rates her pain 6. She doesn't follow a exercise routine. Encourage to start stretching and chair exercises she verbalizes understanding.She lives a sedentary lifestyle encourage to become more active. She uses ice therapy along with her medication regime for pain relief.  She states" she fell twice in her bedroom, she was taking a nap and thought she heard someone calling her and tried to get up too fast. She fell into her closet and broke the closet door. She didn't seek medical attention. Educated on falls prevention and she verbalizes understanding. Pain Inventory Average Pain 7 Pain Right Now 6 My pain is sharp, burning, stabbing, tingling and aching  In the last 24 hours, has pain interfered with the following? General activity 6 Relation with others 6 Enjoyment of life 8 What TIME of day is your pain at its worst? morning, night Sleep (in general) Poor  Pain is worse with: inactivity Pain improves with: rest and heat/ice Relief from Meds: 5  Mobility walk with assistance use a walker how many minutes can you walk? 10 ability to climb steps?  yes do you drive?  no Do you have any goals in this area?  yes  Function disabled: date disabled na I need assistance with the following:  bathing, household duties and shopping Do you have any goals in this area?  yes  Neuro/Psych bladder control problems tingling trouble walking depression anxiety  Prior Studies Any changes since last visit?  yes other  Physicians involved in your care Any changes since last visit?  no   Family History  Problem Relation Age of Onset  . Cancer    . Coronary artery disease     History   Social History  . Marital  Status: Legally Separated    Spouse Name: N/A    Number of Children: N/A  . Years of Education: N/A   Occupational History  . Disabled    Social History Main Topics  . Smoking status: Never Smoker   . Smokeless tobacco: Never Used  . Alcohol Use: No  . Drug Use: No  . Sexual Activity: None   Other Topics Concern  . None   Social History Narrative   Patient does not get regular exercise   Past Surgical History  Procedure Laterality Date  . Coronary angioplasty with stent placement    . Colonoscopy  08/06/2011    Procedure: COLONOSCOPY;  Surgeon: Rogene Houston, MD;  [facility]: [facility];  Service: Endoscopy;  Laterality: N/A;  215  . Arm surgery Bilateral     due to fall-pt broke both forearms  . Colonoscopy with propofol N/A 12/31/2012    Procedure: COLONOSCOPY WITH PROPOFOL;  Surgeon: Rogene Houston, MD;  Location: [facility];  Service: Endoscopy;  Laterality: N/A;  in cecum at 0814, total withdrawal time 55min  . Esophagogastroduodenoscopy (egd) with propofol N/A 12/31/2012    Procedure: ESOPHAGOGASTRODUODENOSCOPY (EGD) WITH PROPOFOL;  Surgeon: Rogene Houston, MD;  Location: [facility];  Service: Endoscopy;  Laterality: N/A;  GE junction 37,  . Maloney dilation N/A 12/31/2012    Procedure: Venia Minks DILATION;  Surgeon: Rogene Houston, MD;  Location: [facility];  Service: Endoscopy;  Laterality: N/A;  used a # 54,#56  . Cholecystectomy    . Abdominal hysterectomy    . Esophagogastroduodenoscopy (egd) with propofol N/A 07/01/2013    Procedure: ESOPHAGOGASTRODUODENOSCOPY (EGD) WITH PROPOFOL;  Surgeon: Rogene Houston, MD;  Location: [facility];  Service: Endoscopy;  Laterality: N/A;  950  . Maloney dilation N/A 07/01/2013    Procedure: Venia Minks DILATION;  Surgeon: Rogene Houston, MD;  Location: [facility];  Service: Endoscopy;  Laterality: N/A;  54/58; no heme present  . Esophageal biopsy N/A 07/01/2013    Procedure: BIOPSY;  Surgeon: Rogene Houston, MD;  Location: [facility];  Service: Endoscopy;   Laterality: N/A;   Past Medical History  Diagnosis Date  . Palpitations   . Tachycardia   . CAD (coronary artery disease)     Stent placement circumflex coronary 2007, catheterization 2008 patent stents. Normal LV function  . Chest pain   . Diabetes mellitus     Insulin dependent  . Dyslipidemia   . Anxiety and depression   . Diabetic polyneuropathy      severe on multiple medications  . 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 MI (myocardial infarction)     2007  . Anxiety   . Depression   . Obstructive sleep apnea     CPAP machine at night  . COPD (chronic obstructive pulmonary disease)   . GERD (gastroesophageal reflux disease)   . CHF (congestive heart failure)   . Headache(784.0)   . Arthritis    BP 137/62  Pulse 65  Resp 14  Ht 5\' 6"  (1.676 m)  Wt 232 lb (105.235 kg)  BMI 37.46 kg/m2  SpO2 96%  Opioid Risk Score:   Fall Risk Score: High Fall Risk (>13 points) (pt educated on fall risk, brochure given to pt)    Review of Systems  Constitutional: Positive for unexpected weight change.  Respiratory: Positive for apnea.   Gastrointestinal: Positive for nausea.  Endocrine:       Low blood sugar  Genitourinary:       Bladder control problems  Musculoskeletal: Positive for back pain and gait problem.  Neurological: Positive for dizziness.       Tingling  Psychiatric/Behavioral:       Depression  All other systems reviewed and are negative.      Objective:   Physical Exam  Nursing note and vitals reviewed. Constitutional: She is oriented to person, place, and time. She appears well-developed and well-nourished.  Arrived to clinic using her cadillac walker  HENT:  Head: Normocephalic and atraumatic.  Neck: Normal range of motion. Neck supple.  Musculoskeletal:  Normal Muscle Bulk and Muscle Testing Reveals: Upper Extremities Full ROM and Muscle strength 5/5 Spine Flexion 30 degrees Lumbar Paraspinal Tenderness Noted: L-3- L-5 Lower extremities: Bilateral Lower  Extremities Flexion Produces Pain to Lumbar Arises form chair with ease Using Walker Narrow Based gait  Neurological: She is alert and oriented to person, place, and time.  Skin: Skin is warm and dry.  Psychiatric: She has a normal mood and affect.          Assessment & Plan:  1.Lumbar pain lumbar spondylosis:  Refilled: Opana 30 mg one tablet every 12 hours as needed #60 2. Severe diabetic poly neuropathy: Continue with  Gabapentin and  Topamax.  20 minutes of face to face patient care time was spent during this visit. All questions were encouraged and answered.  F/U in 1 Month.

## 2013-09-08 ENCOUNTER — Encounter: Payer: Self-pay | Admitting: Registered Nurse

## 2013-09-08 ENCOUNTER — Encounter: Payer: Medicaid Other | Attending: Physical Medicine & Rehabilitation | Admitting: Registered Nurse

## 2013-09-08 VITALS — BP 119/59 | HR 63 | Resp 14 | Ht 66.0 in | Wt 232.0 lb

## 2013-09-08 DIAGNOSIS — M48061 Spinal stenosis, lumbar region without neurogenic claudication: Secondary | ICD-10-CM

## 2013-09-08 DIAGNOSIS — J449 Chronic obstructive pulmonary disease, unspecified: Secondary | ICD-10-CM | POA: Insufficient documentation

## 2013-09-08 DIAGNOSIS — F411 Generalized anxiety disorder: Secondary | ICD-10-CM | POA: Insufficient documentation

## 2013-09-08 DIAGNOSIS — F3289 Other specified depressive episodes: Secondary | ICD-10-CM | POA: Insufficient documentation

## 2013-09-08 DIAGNOSIS — K589 Irritable bowel syndrome without diarrhea: Secondary | ICD-10-CM

## 2013-09-08 DIAGNOSIS — E1142 Type 2 diabetes mellitus with diabetic polyneuropathy: Secondary | ICD-10-CM | POA: Insufficient documentation

## 2013-09-08 DIAGNOSIS — E1349 Other specified diabetes mellitus with other diabetic neurological complication: Secondary | ICD-10-CM

## 2013-09-08 DIAGNOSIS — Z79899 Other long term (current) drug therapy: Secondary | ICD-10-CM

## 2013-09-08 DIAGNOSIS — I2510 Atherosclerotic heart disease of native coronary artery without angina pectoris: Secondary | ICD-10-CM | POA: Insufficient documentation

## 2013-09-08 DIAGNOSIS — G4733 Obstructive sleep apnea (adult) (pediatric): Secondary | ICD-10-CM | POA: Insufficient documentation

## 2013-09-08 DIAGNOSIS — E1149 Type 2 diabetes mellitus with other diabetic neurological complication: Secondary | ICD-10-CM | POA: Insufficient documentation

## 2013-09-08 DIAGNOSIS — K219 Gastro-esophageal reflux disease without esophagitis: Secondary | ICD-10-CM | POA: Insufficient documentation

## 2013-09-08 DIAGNOSIS — I252 Old myocardial infarction: Secondary | ICD-10-CM | POA: Insufficient documentation

## 2013-09-08 DIAGNOSIS — Z5181 Encounter for therapeutic drug level monitoring: Secondary | ICD-10-CM

## 2013-09-08 DIAGNOSIS — F329 Major depressive disorder, single episode, unspecified: Secondary | ICD-10-CM | POA: Insufficient documentation

## 2013-09-08 DIAGNOSIS — E0842 Diabetes mellitus due to underlying condition with diabetic polyneuropathy: Secondary | ICD-10-CM

## 2013-09-08 DIAGNOSIS — Z794 Long term (current) use of insulin: Secondary | ICD-10-CM | POA: Insufficient documentation

## 2013-09-08 DIAGNOSIS — J4489 Other specified chronic obstructive pulmonary disease: Secondary | ICD-10-CM | POA: Insufficient documentation

## 2013-09-08 MED ORDER — OXYMORPHONE HCL ER 30 MG PO TB12: 30.0000 mg | ORAL_TABLET | Freq: Two times a day (BID) | ORAL | Status: DC

## 2013-09-08 NOTE — Progress Notes (Signed)
Subjective:    Patient ID: [name], female    DOB: Dec 09, 1950, 63 y.o.   MRN: [id number]  HPI: [name] is a 63 year old female who returns for follow up for chronic pain and medication refill. She says her pain is located in her lower back. She rates her pain 6. She has started an exercise routine. Herexercise regimen is performing chair exercises twice a day.She uses ice therapy along with her medication regime for pain relief. Noticed pitting edema and anklet socks were restricting blood flow. I talked to her about buying diabetic socks. She only has one pair and they are worn. She says she can't afford to buy another pair. I asked her to call the [employer] to see if there is any help available to help her to purchase a pair.  Husband in room with her. All questions answered.     Pain Inventory Average Pain 7 Pain Right Now 6 My pain is sharp, burning, stabbing, tingling and aching  In the last 24 hours, has pain interfered with the following? General activity 8 Relation with others 7 Enjoyment of life 8 What TIME of day is your pain at its worst? night Sleep (in general) Poor  Pain is worse with: walking, bending, sitting, standing and some activites Pain improves with: rest and medication Relief from Meds: 6  Mobility walk without assistance use a cane use a walker do you drive?  no needs help with transfers Do you have any goals in this area?  yes  Function disabled: date disabled . I need assistance with the following:  bathing, household duties and shopping  Neuro/Psych bladder control problems bowel control problems weakness numbness tingling spasms depression anxiety  Prior Studies Any changes since last visit?  no  Physicians involved in your care Any changes since last visit?  no   Family History  Problem Relation Age of Onset  . Cancer    . Coronary artery disease     History   Social History  . Marital  Status: Legally Separated    Spouse Name: N/A    Number of Children: N/A  . Years of Education: N/A   Occupational History  . Disabled    Social History Main Topics  . Smoking status: Never Smoker   . Smokeless tobacco: Never Used  . Alcohol Use: No  . Drug Use: No  . Sexual Activity: None   Other Topics Concern  . None   Social History Narrative   Patient does not get regular exercise   Past Surgical History  Procedure Laterality Date  . Coronary angioplasty with stent placement    . Colonoscopy  08/06/2011    Procedure: COLONOSCOPY;  Surgeon: Rogene Houston, MD;  [facility]: [facility];  Service: Endoscopy;  Laterality: N/A;  215  . Arm surgery Bilateral     due to fall-pt broke both forearms  . Colonoscopy with propofol N/A 12/31/2012    Procedure: COLONOSCOPY WITH PROPOFOL;  Surgeon: Rogene Houston, MD;  Location: [facility];  Service: Endoscopy;  Laterality: N/A;  in cecum at 0814, total withdrawal time 43mn  . Esophagogastroduodenoscopy (egd) with propofol N/A 12/31/2012    Procedure: ESOPHAGOGASTRODUODENOSCOPY (EGD) WITH PROPOFOL;  Surgeon: NRogene Houston MD;  Location: [facility];  Service: Endoscopy;  Laterality: N/A;  GE junction 37,  . Maloney dilation N/A 12/31/2012    Procedure: MVenia MinksDILATION;  Surgeon: NRogene Houston MD;  Location: [facility];  Service:  Endoscopy;  Laterality: N/A;  used a # 54,#56  . Cholecystectomy    . Abdominal hysterectomy    . Esophagogastroduodenoscopy (egd) with propofol N/A 07/01/2013    Procedure: ESOPHAGOGASTRODUODENOSCOPY (EGD) WITH PROPOFOL;  Surgeon: Rogene Houston, MD;  Location: [facility];  Service: Endoscopy;  Laterality: N/A;  950  . Maloney dilation N/A 07/01/2013    Procedure: Venia Minks DILATION;  Surgeon: Rogene Houston, MD;  Location: [facility];  Service: Endoscopy;  Laterality: N/A;  54/58; no heme present  . Esophageal biopsy N/A 07/01/2013    Procedure: BIOPSY;  Surgeon: Rogene Houston, MD;  Location: [facility];  Service: Endoscopy;   Laterality: N/A;   Past Medical History  Diagnosis Date  . Palpitations   . Tachycardia   . CAD (coronary artery disease)     Stent placement circumflex coronary 2007, catheterization 2008 patent stents. Normal LV function  . Chest pain   . Diabetes mellitus     Insulin dependent  . Dyslipidemia   . Anxiety and depression   . Diabetic polyneuropathy      severe on multiple medications  . 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 MI (myocardial infarction)     2007  . Anxiety   . Depression   . Obstructive sleep apnea     CPAP machine at night  . COPD (chronic obstructive pulmonary disease)   . GERD (gastroesophageal reflux disease)   . CHF (congestive heart failure)   . Headache(784.0)   . Arthritis    BP 119/59  Pulse 63  Resp 14  Ht 5' 6"  (1.676 m)  Wt 232 lb (105.235 kg)  BMI 37.46 kg/m2  SpO2 95%  Opioid Risk Score:   Fall Risk Score: High Fall Risk (>13 points) (patient educated handout declined)   Review of Systems  Respiratory: Positive for apnea.   Gastrointestinal: Positive for constipation.  Genitourinary: Positive for difficulty urinating.  Musculoskeletal: Positive for back pain.  Neurological: Positive for weakness and numbness.  All other systems reviewed and are negative.      Objective:   Physical Exam  Nursing note and vitals reviewed. Constitutional: She is oriented to person, place, and time. She appears well-developed and well-nourished.  HENT:  Head: Normocephalic and atraumatic.  Neck: Normal range of motion. Neck supple.  Cardiovascular: Normal rate and regular rhythm.   Pulmonary/Chest: Effort normal and breath sounds normal.  Musculoskeletal: She exhibits edema.  Normal Muscle Bulk and Muscle testing reveals: Upper extremities Full ROM and Muscle Strength on the Right 4/5 and Left 5/5. Spinal Forward Flexion: 45 Degrees and Extension 30 degrees Thoracic and Lumbar Hypersensitivity Lower Extremities: Flexion Produces Pain into Lumbar  Arises from  chair with ease Narrow based Gait    Neurological: She is alert and oriented to person, place, and time.  Skin: Skin is warm and dry.  Psychiatric: She has a normal mood and affect.          Assessment & Plan:  1.Lumbar pain lumbar spondylosis:  Refilled: Opana 30 mg one tablet every 12 hours as needed #60  2. Severe diabetic poly neuropathy: Continue with Gabapentin and Topamax.   20 minutes of face to face patient care time was spent during this visit. All questions were encouraged and answered.   F/U in 1 Month.

## 2013-09-27 ENCOUNTER — Ambulatory Visit (INDEPENDENT_AMBULATORY_CARE_PROVIDER_SITE_OTHER): Payer: Medicaid Other | Admitting: Internal Medicine

## 2013-09-27 ENCOUNTER — Encounter (INDEPENDENT_AMBULATORY_CARE_PROVIDER_SITE_OTHER): Payer: Self-pay | Admitting: Internal Medicine

## 2013-09-27 VITALS — BP 106/68 | HR 66 | Temp 97.8°F | Resp 16 | Ht 66.0 in | Wt 239.9 lb

## 2013-09-27 DIAGNOSIS — R1310 Dysphagia, unspecified: Secondary | ICD-10-CM

## 2013-09-27 DIAGNOSIS — K900 Celiac disease: Secondary | ICD-10-CM

## 2013-09-27 DIAGNOSIS — K589 Irritable bowel syndrome without diarrhea: Secondary | ICD-10-CM

## 2013-09-27 MED ORDER — DICYCLOMINE HCL 10 MG PO CAPS: 10.0000 mg | ORAL_CAPSULE | Freq: Two times a day (BID) | ORAL | Status: DC | PRN

## 2013-09-27 NOTE — Patient Instructions (Signed)
Call if swallowing difficulty gets worse. 

## 2013-09-27 NOTE — Progress Notes (Signed)
Presenting complaint;  Followup for dysphagia and celiac disease.  Subjective:  Patient is 63 year old Caucasian female who is here for scheduled visit accompanied by her partner Joe. She had EGD with dilation on 07/01/2013 along with duodenal biopsy and confirmed to have celiac disease. She was seen by the dietitian and begun on gluten-free diet. Patient states her diarrhea has resolved. However she remains with dysphagia. Swallowing difficulty occurs frequently but not daily. She has had a few occasions where Joe had to perform a Heimlich maneuver for her to get relief. She has occasional heartburn.   Current Medications: Outpatient Encounter Prescriptions as of 09/27/2013  Medication Sig  . ALPRAZolam (XANAX) 1 MG tablet Take 1 mg by mouth 4 (four) times daily.    Marland Kitchen aspirin EC 81 MG tablet Take 81 mg by mouth daily.  Marland Kitchen atorvastatin (LIPITOR) 40 MG tablet Take 40 mg by mouth daily.  Marland Kitchen dicyclomine (BENTYL) 10 MG capsule Take 1 capsule (10 mg total) by mouth 2 (two) times daily.  Marland Kitchen estrogens, conjugated, (PREMARIN) 1.25 MG tablet Take 1.25 mg by mouth daily.   . fluticasone-salmeterol (ADVAIR HFA) 115-21 MCG/ACT inhaler Inhale 2 puffs into the lungs 2 (two) times daily.  Marland Kitchen ibuprofen (ADVIL,MOTRIN) 200 MG tablet Take 200 mg by mouth daily as needed for headache.  . insulin glargine (LANTUS) 100 UNIT/ML injection Inject 30-60 Units into the skin 2 (two) times daily.   . insulin lispro (HUMALOG) 100 UNIT/ML injection Inject 2-10 Units into the skin 3 (three) times daily before meals. Per patient this is now uses as a sliding scale.  . lidocaine (XYLOCAINE) 5 % ointment Apply 1 application topically at bedtime.   Marland Kitchen lisinopril (PRINIVIL,ZESTRIL) 10 MG tablet Take by mouth daily.  Marland Kitchen loperamide (IMODIUM A-D) 2 MG tablet Take 1 tablet (2 mg total) by mouth 2 (two) times daily with breakfast and lunch.  . methocarbamol (ROBAXIN) 500 MG tablet TAKE 1 TABLET BY MOUTH EVERY 8 HOURS AS NEEDED FOR MUSCLE  SPASMS.  . nitroGLYCERIN (NITROSTAT) 0.4 MG SL tablet Place 1 tablet (0.4 mg total) under the tongue every 5 (five) minutes x 3 doses as needed. For chest pains  . ondansetron (ZOFRAN) 4 MG tablet Take 4 mg by mouth every 6 (six) hours as needed for nausea or vomiting.  Marland Kitchen oxymorphone (OPANA ER) 30 MG 12 hr tablet Take 1 tablet (30 mg total) by mouth every 12 (twelve) hours.  . pantoprazole (PROTONIX) 40 MG tablet Take 1 tablet (40 mg total) by mouth daily.  . potassium chloride (K-DUR) 10 MEQ tablet Take 2 tablets (20 mEq total) by mouth 2 (two) times daily.  . psyllium (METAMUCIL SMOOTH TEXTURE) 28 % packet Take 1 packet by mouth at bedtime.  . topiramate (TOPAMAX) 200 MG tablet Take 200 mg by mouth daily.  . TOPROL XL 50 MG 24 hr tablet TAKE 1 TABLET BY MOUTH DAILY WITH OR IMMEDIATELY FOLLOWING A MEAL.     Objective: Blood pressure 106/68, pulse 66, temperature 97.8 F (36.6 C), temperature source Oral, resp. rate 16, height 5' 6"  (1.676 m), weight 239 lb 14.4 oz (108.818 kg). Patient is alert and in no acute distress. Conjunctiva is pink. Sclera is nonicteric Oropharyngeal mucosa is normal. No neck masses or thyromegaly noted. Cardiac exam with regular rhythm normal S1 and S2. No murmur or gallop noted. Lungs are clear to auscultation. Abdomen is full but soft and nontender without organomegaly or masses.  No LE edema or clubbing noted.  Labs/studies Results: Lab data  from 07/11/2013   TTG IgA antibody negative. Antigliadin IgG positive at 115 and IgA was negative  Assessment:  #1. Celiac disease. Since he has been gluten-free diet her diarrhea has resolved. Therefore does not need loperamide and could use dicyclomine on as-needed basis. #2. Dysphagia. She did not respond to esophageal dilation. Suspect she has esophageal motility disorder. She is not quite ready to be referred to tertiary center for esophageal manometry. If dysphagia worsens will consider high def esophageal  manometry. #3.GERD. Heartburn reasonably well-controlled with therapy.    Plan:  Discontinue loperamide. Use dicyclomine on as-needed basis. Call if dysphagia worsens. Office visit in 6 months.

## 2013-10-06 ENCOUNTER — Encounter: Payer: Medicaid Other | Attending: Physical Medicine & Rehabilitation | Admitting: Registered Nurse

## 2013-10-06 ENCOUNTER — Encounter: Payer: Self-pay | Admitting: Registered Nurse

## 2013-10-06 VITALS — BP 140/72 | HR 60 | Resp 14 | Wt 243.0 lb

## 2013-10-06 DIAGNOSIS — I2510 Atherosclerotic heart disease of native coronary artery without angina pectoris: Secondary | ICD-10-CM | POA: Diagnosis not present

## 2013-10-06 DIAGNOSIS — I252 Old myocardial infarction: Secondary | ICD-10-CM | POA: Insufficient documentation

## 2013-10-06 DIAGNOSIS — M48061 Spinal stenosis, lumbar region without neurogenic claudication: Secondary | ICD-10-CM | POA: Diagnosis present

## 2013-10-06 DIAGNOSIS — F329 Major depressive disorder, single episode, unspecified: Secondary | ICD-10-CM | POA: Insufficient documentation

## 2013-10-06 DIAGNOSIS — J449 Chronic obstructive pulmonary disease, unspecified: Secondary | ICD-10-CM | POA: Diagnosis not present

## 2013-10-06 DIAGNOSIS — K219 Gastro-esophageal reflux disease without esophagitis: Secondary | ICD-10-CM | POA: Insufficient documentation

## 2013-10-06 DIAGNOSIS — Z794 Long term (current) use of insulin: Secondary | ICD-10-CM | POA: Diagnosis not present

## 2013-10-06 DIAGNOSIS — Z79899 Other long term (current) drug therapy: Secondary | ICD-10-CM

## 2013-10-06 DIAGNOSIS — E1142 Type 2 diabetes mellitus with diabetic polyneuropathy: Secondary | ICD-10-CM | POA: Insufficient documentation

## 2013-10-06 DIAGNOSIS — J4489 Other specified chronic obstructive pulmonary disease: Secondary | ICD-10-CM | POA: Insufficient documentation

## 2013-10-06 DIAGNOSIS — E0842 Diabetes mellitus due to underlying condition with diabetic polyneuropathy: Secondary | ICD-10-CM

## 2013-10-06 DIAGNOSIS — G4733 Obstructive sleep apnea (adult) (pediatric): Secondary | ICD-10-CM | POA: Insufficient documentation

## 2013-10-06 DIAGNOSIS — F3289 Other specified depressive episodes: Secondary | ICD-10-CM | POA: Diagnosis not present

## 2013-10-06 DIAGNOSIS — F411 Generalized anxiety disorder: Secondary | ICD-10-CM | POA: Diagnosis not present

## 2013-10-06 DIAGNOSIS — E1349 Other specified diabetes mellitus with other diabetic neurological complication: Secondary | ICD-10-CM

## 2013-10-06 DIAGNOSIS — E1149 Type 2 diabetes mellitus with other diabetic neurological complication: Secondary | ICD-10-CM | POA: Insufficient documentation

## 2013-10-06 DIAGNOSIS — Z5181 Encounter for therapeutic drug level monitoring: Secondary | ICD-10-CM

## 2013-10-06 MED ORDER — OXYMORPHONE HCL ER 30 MG PO TB12: 30.0000 mg | ORAL_TABLET | Freq: Two times a day (BID) | ORAL | Status: DC

## 2013-10-06 NOTE — Progress Notes (Signed)
Subjective:    Patient ID: [name], female    DOB: January 02, 1951, 63 y.o.   MRN: [id number]  HPI: [name] is a 63 year old female who returns for follow up for chronic pain and medication refill. She says her pain is located in her lower back and bilateral feet. She rates her pain 8. Her current exercise regimen is performing chair exercises 4-5 times a week, performing stretching exercises and short distance walking. She has cadillac walker..She uses ice therapy along with her medication regime for pain relief. She will try arnicare gel as well.  She has 2+ pitting edema and her anklet socks are  restricting blood flow. Her [place] is assisting with the purchase of diabetic socks.   Pain Inventory Average Pain 7 Pain Right Now 8 My pain is sharp, burning, stabbing, tingling and aching  In the last 24 hours, has pain interfered with the following? General activity 7 Relation with others 8 Enjoyment of life 8 What TIME of day is your pain at its worst? evening and night Sleep (in general) Fair  Pain is worse with: walking, bending, inactivity and standing Pain improves with: rest, heat/ice and medication Relief from Meds: 8  Mobility use a walker ability to climb steps?  no do you drive?  no needs help with transfers  Function disabled: date disabled . I need assistance with the following:  bathing, meal prep, household duties and shopping  Neuro/Psych bladder control problems weakness numbness tingling trouble walking spasms dizziness depression anxiety  Prior Studies Any changes since last visit?  no  Physicians involved in your care Any changes since last visit?  no   Family History  Problem Relation Age of Onset  . Cancer    . Coronary artery disease     History   Social History  . Marital Status: Legally Separated    Spouse Name: N/A    Number of Children: N/A  . Years of Education: N/A   Occupational History  .  Disabled    Social History Main Topics  . Smoking status: Never Smoker   . Smokeless tobacco: Never Used  . Alcohol Use: No  . Drug Use: No  . Sexual Activity: None   Other Topics Concern  . None   Social History Narrative   Patient does not get regular exercise   Past Surgical History  Procedure Laterality Date  . Coronary angioplasty with stent placement    . Colonoscopy  08/06/2011    Procedure: COLONOSCOPY;  Surgeon: Rogene Houston, MD;  [facility]: [facility];  Service: Endoscopy;  Laterality: N/A;  215  . Arm surgery Bilateral     due to fall-pt broke both forearms  . Colonoscopy with propofol N/A 12/31/2012    Procedure: COLONOSCOPY WITH PROPOFOL;  Surgeon: Rogene Houston, MD;  Location: [facility];  Service: Endoscopy;  Laterality: N/A;  in cecum at 0814, total withdrawal time 59min  . Esophagogastroduodenoscopy (egd) with propofol N/A 12/31/2012    Procedure: ESOPHAGOGASTRODUODENOSCOPY (EGD) WITH PROPOFOL;  Surgeon: Rogene Houston, MD;  Location: [facility];  Service: Endoscopy;  Laterality: N/A;  GE junction 37,  . Maloney dilation N/A 12/31/2012    Procedure: Venia Minks DILATION;  Surgeon: Rogene Houston, MD;  Location: [facility];  Service: Endoscopy;  Laterality: N/A;  used a # 54,#56  . Cholecystectomy    . Abdominal hysterectomy    . Esophagogastroduodenoscopy (egd) with propofol N/A 07/01/2013  Procedure: ESOPHAGOGASTRODUODENOSCOPY (EGD) WITH PROPOFOL;  Surgeon: Rogene Houston, MD;  Location: [facility];  Service: Endoscopy;  Laterality: N/A;  950  . Maloney dilation N/A 07/01/2013    Procedure: Venia Minks DILATION;  Surgeon: Rogene Houston, MD;  Location: [facility];  Service: Endoscopy;  Laterality: N/A;  54/58; no heme present  . Esophageal biopsy N/A 07/01/2013    Procedure: BIOPSY;  Surgeon: Rogene Houston, MD;  Location: [facility];  Service: Endoscopy;  Laterality: N/A;   Past Medical History  Diagnosis Date  . Palpitations   . Tachycardia   . CAD (coronary artery disease)      Stent placement circumflex coronary 2007, catheterization 2008 patent stents. Normal LV function  . Chest pain   . Diabetes mellitus     Insulin dependent  . Dyslipidemia   . Anxiety and depression   . Diabetic polyneuropathy      severe on multiple medications  . 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 MI (myocardial infarction)     2007  . Anxiety   . Depression   . Obstructive sleep apnea     CPAP machine at night  . COPD (chronic obstructive pulmonary disease)   . GERD (gastroesophageal reflux disease)   . CHF (congestive heart failure)   . Headache(784.0)   . Arthritis    BP 140/72  Pulse 60  Resp 14  Wt 243 lb (110.224 kg)  SpO2 95%  Opioid Risk Score:   Fall Risk Score: High Fall Risk (>13 points) (previously educated and given handout on fall prevention @3 /2/15 visit)  Review of Systems  Respiratory: Positive for cough and shortness of breath.   Genitourinary:       Bladder control problems  Musculoskeletal: Positive for gait problem.       Spasms  Neurological: Positive for dizziness, weakness and numbness.       Tingling  Psychiatric/Behavioral: Positive for dysphoric mood. The patient is nervous/anxious.   All other systems reviewed and are negative.      Objective:   Physical Exam  Nursing note and vitals reviewed. Constitutional: She is oriented to person, place, and time. She appears well-developed and well-nourished.  HENT:  Head: Normocephalic and atraumatic.  Neck: Normal range of motion. Neck supple.  Cardiovascular: Normal rate and regular rhythm.   Pulmonary/Chest: Effort normal and breath sounds normal.  Musculoskeletal: She exhibits edema.  Normal Muscle Bulk and Muscle testing Reveals: Upper Extremities: Full ROM and Muscle Strength on the Right 4/5 and Left 3/5. Spinal Forward Flexiin 30 Degrees and Extension 10 Degrees Thoracic and Lumbar Hypersensitivity Lower Extremities: Full ROM and Muscle strength 5/5 Right Leg Flexion Produces Pain Lumbar and  Radiates Laterally Left Leg Flexion Produces pain into Lumbar Arises from chair with ease Narrow Based Gait/ Uses Cadillac Walker  Neurological: She is alert and oriented to person, place, and time.  Skin: Skin is warm and dry.  Psychiatric: She has a normal mood and affect.          Assessment & Plan:  1.Lumbar pain lumbar spondylosis:  Refilled: Opana 30 mg one tablet every 12 hours as needed #60  2. Severe diabetic poly neuropathy: Continue withTopamax.   20 minutes of face to face patient care time was spent during this visit. All questions were encouraged and answered.   F/U in 1 Month.

## 2013-10-11 ENCOUNTER — Ambulatory Visit (INDEPENDENT_AMBULATORY_CARE_PROVIDER_SITE_OTHER): Payer: Medicaid Other

## 2013-10-11 ENCOUNTER — Other Ambulatory Visit: Payer: Self-pay | Admitting: *Deleted

## 2013-10-11 DIAGNOSIS — M2040 Other hammer toe(s) (acquired), unspecified foot: Secondary | ICD-10-CM

## 2013-10-11 DIAGNOSIS — M79609 Pain in unspecified limb: Secondary | ICD-10-CM

## 2013-10-11 DIAGNOSIS — E1142 Type 2 diabetes mellitus with diabetic polyneuropathy: Secondary | ICD-10-CM

## 2013-10-11 DIAGNOSIS — E1140 Type 2 diabetes mellitus with diabetic neuropathy, unspecified: Secondary | ICD-10-CM

## 2013-10-11 DIAGNOSIS — M79606 Pain in leg, unspecified: Secondary | ICD-10-CM

## 2013-10-11 DIAGNOSIS — E1149 Type 2 diabetes mellitus with other diabetic neurological complication: Secondary | ICD-10-CM

## 2013-10-11 DIAGNOSIS — Q828 Other specified congenital malformations of skin: Secondary | ICD-10-CM

## 2013-10-11 DIAGNOSIS — B351 Tinea unguium: Secondary | ICD-10-CM

## 2013-10-11 MED ORDER — NITROGLYCERIN 0.4 MG SL SUBL: 0.4000 mg | SUBLINGUAL_TABLET | SUBLINGUAL | Status: DC | PRN

## 2013-10-11 NOTE — Progress Notes (Signed)
   Subjective:    Patient ID: [name], female    DOB: 1951-01-13, 63 y.o.   MRN: [id number]  HPI  Pt presents for debridement and callous trim  Review of Systems     Objective:   Physical Exam Lower extremity objective findings as follows vascular status is intact with pedal pulses DP and PT palpable DP postal for PT one over 4 bilateral capillary refill time 3 seconds all digits epicritic sensation diminished to the toes and plantar forefoot on Semmes Weinstein testing DTRs not elicited dermatologically skin color pigment normal hair growth absent nails thick and criptotic incurvated friable and friable. Patient does have keratoses pinch callus first MTP area right foot no open wounds the ulcers no secondary infection is noted mild digital contractures and mild HAV deformity noted as well       Assessment & Plan:  Assessment this time his diabetes with history peripheral neuropathy dystrophic probably orthotic nails debrided 1 through 5 bilateral also recently keratotic lesion pinch callus right great toe joint at first MTP reappointed future as needed for followup lumicain Neosporin applied to the first MTP debridement site. Recheck in 3 months or as recommended the future needed  Harriet Masson DPM

## 2013-10-11 NOTE — Patient Instructions (Signed)
Diabetes and Foot Care Diabetes may cause you to have problems because of poor blood supply (circulation) to your feet and legs. This may cause the skin on your feet to become thinner, break easier, and heal more slowly. Your skin may become dry, and the skin may peel and crack. You may also have nerve damage in your legs and feet causing decreased feeling in them. You may not notice minor injuries to your feet that could lead to infections or more serious problems. Taking care of your feet is one of the most important things you can do for yourself.  HOME CARE INSTRUCTIONS  Wear shoes at all times, even in the house. Do not go barefoot. Bare feet are easily injured.  Check your feet daily for blisters, cuts, and redness. If you cannot see the bottom of your feet, use a mirror or ask someone for help.  Wash your feet with warm water (do not use hot water) and mild soap. Then pat your feet and the areas between your toes until they are completely dry. Do not soak your feet as this can dry your skin.  Apply a moisturizing lotion or petroleum jelly (that does not contain alcohol and is unscented) to the skin on your feet and to dry, brittle toenails. Do not apply lotion between your toes.  Trim your toenails straight across. Do not dig under them or around the cuticle. File the edges of your nails with an emery board or nail file.  Do not cut corns or calluses or try to remove them with medicine.  Wear clean socks or stockings every day. Make sure they are not too tight. Do not wear knee-high stockings since they may decrease blood flow to your legs.  Wear shoes that fit properly and have enough cushioning. To break in new shoes, wear them for just a few hours a day. This prevents you from injuring your feet. Always look in your shoes before you put them on to be sure there are no objects inside.  Do not cross your legs. This may decrease the blood flow to your feet.  If you find a minor scrape,  cut, or break in the skin on your feet, keep it and the skin around it clean and dry. These areas may be cleansed with mild soap and water. Do not cleanse the area with peroxide, alcohol, or iodine.  When you remove an adhesive bandage, be sure not to damage the skin around it.  If you have a wound, look at it several times a day to make sure it is healing.  Do not use heating pads or hot water bottles. They may burn your skin. If you have lost feeling in your feet or legs, you may not know it is happening until it is too late.  Make sure your health care provider performs a complete foot exam at least annually or more often if you have foot problems. Report any cuts, sores, or bruises to your health care provider immediately. SEEK MEDICAL CARE IF:   You have an injury that is not healing.  You have cuts or breaks in the skin.  You have an ingrown nail.  You notice redness on your legs or feet.  You feel burning or tingling in your legs or feet.  You have pain or cramps in your legs and feet.  Your legs or feet are numb.  Your feet always feel cold. SEEK IMMEDIATE MEDICAL CARE IF:   There is increasing redness,   swelling, or pain in or around a wound.  There is a red line that goes up your leg.  Pus is coming from a wound.  You develop a fever or as directed by your health care provider.  You notice a bad smell coming from an ulcer or wound. Document Released: 02/29/2000 Document Revised: 11/03/2012 Document Reviewed: 08/10/2012 Exit[employer] Patient Information 2015 [employer]. This information is not intended to replace advice given to you by your health care provider. Make sure you discuss any questions you have with your health care provider.  

## 2013-11-07 ENCOUNTER — Encounter: Payer: Self-pay | Admitting: Registered Nurse

## 2013-11-07 ENCOUNTER — Other Ambulatory Visit: Payer: Self-pay | Admitting: Physical Medicine & Rehabilitation

## 2013-11-07 ENCOUNTER — Encounter: Payer: Medicaid Other | Attending: Physical Medicine & Rehabilitation | Admitting: Registered Nurse

## 2013-11-07 VITALS — BP 112/55 | HR 75 | Resp 16 | Wt 236.8 lb

## 2013-11-07 DIAGNOSIS — F3289 Other specified depressive episodes: Secondary | ICD-10-CM | POA: Insufficient documentation

## 2013-11-07 DIAGNOSIS — Z5181 Encounter for therapeutic drug level monitoring: Secondary | ICD-10-CM

## 2013-11-07 DIAGNOSIS — E1149 Type 2 diabetes mellitus with other diabetic neurological complication: Secondary | ICD-10-CM | POA: Insufficient documentation

## 2013-11-07 DIAGNOSIS — F329 Major depressive disorder, single episode, unspecified: Secondary | ICD-10-CM | POA: Insufficient documentation

## 2013-11-07 DIAGNOSIS — Z79899 Other long term (current) drug therapy: Secondary | ICD-10-CM

## 2013-11-07 DIAGNOSIS — E0842 Diabetes mellitus due to underlying condition with diabetic polyneuropathy: Secondary | ICD-10-CM

## 2013-11-07 DIAGNOSIS — I252 Old myocardial infarction: Secondary | ICD-10-CM | POA: Diagnosis not present

## 2013-11-07 DIAGNOSIS — M48061 Spinal stenosis, lumbar region without neurogenic claudication: Secondary | ICD-10-CM | POA: Insufficient documentation

## 2013-11-07 DIAGNOSIS — I2510 Atherosclerotic heart disease of native coronary artery without angina pectoris: Secondary | ICD-10-CM | POA: Diagnosis not present

## 2013-11-07 DIAGNOSIS — Z794 Long term (current) use of insulin: Secondary | ICD-10-CM | POA: Diagnosis not present

## 2013-11-07 DIAGNOSIS — G4733 Obstructive sleep apnea (adult) (pediatric): Secondary | ICD-10-CM | POA: Insufficient documentation

## 2013-11-07 DIAGNOSIS — K219 Gastro-esophageal reflux disease without esophagitis: Secondary | ICD-10-CM | POA: Diagnosis not present

## 2013-11-07 DIAGNOSIS — J4489 Other specified chronic obstructive pulmonary disease: Secondary | ICD-10-CM | POA: Insufficient documentation

## 2013-11-07 DIAGNOSIS — E1142 Type 2 diabetes mellitus with diabetic polyneuropathy: Secondary | ICD-10-CM | POA: Insufficient documentation

## 2013-11-07 DIAGNOSIS — E1349 Other specified diabetes mellitus with other diabetic neurological complication: Secondary | ICD-10-CM

## 2013-11-07 DIAGNOSIS — J449 Chronic obstructive pulmonary disease, unspecified: Secondary | ICD-10-CM | POA: Diagnosis not present

## 2013-11-07 DIAGNOSIS — F411 Generalized anxiety disorder: Secondary | ICD-10-CM | POA: Diagnosis not present

## 2013-11-07 MED ORDER — OXYMORPHONE HCL ER 30 MG PO TB12: 30.0000 mg | ORAL_TABLET | Freq: Two times a day (BID) | ORAL | Status: DC

## 2013-11-07 MED ORDER — TRAZODONE 25 MG HALF TABLET: 25.0000 mg | ORAL_TABLET | Freq: Every day | ORAL | Status: DC

## 2013-11-07 NOTE — Progress Notes (Signed)
Subjective:    Patient ID: [name], female    DOB: Jul 31, 1950, 63 y.o.   MRN: [id number]  HPI: [name] is a 63 year old female who returns for follow up for chronic pain and medication refill. She says her pain is located in her lower back. She admits to increase intensity of pain today, she was out of her medication on Sunday. Explain we will make sure her appointment coincide with medication refills. She verbalizes understanding.  She rates her pain 5. Her current exercise regimen is performing chair exercises 4-5 times a week, performing stretching exercises and short distance walking. She has a cadillac walker.  Pain Inventory Average Pain 6 Pain Right Now 5 My pain is constant, sharp, burning, stabbing, tingling and aching  In the last 24 hours, has pain interfered with the following? General activity 5 Relation with others 5 Enjoyment of life 6 What TIME of day is your pain at its worst? evening and night Sleep (in general) Poor  Pain is worse with: walking, bending, sitting, inactivity, standing and some activites Pain improves with: rest, heat/ice and medication Relief from Meds: 7  Mobility use a walker how many minutes can you walk? 15 ability to climb steps?  no do you drive?  no  Function disabled: date disabled . I need assistance with the following:  bathing, household duties and shopping  Neuro/Psych numbness tingling confusion depression anxiety  Prior Studies Any changes since last visit?  no  Physicians involved in your care Any changes since last visit?  no   Family History  Problem Relation Age of Onset  . Cancer    . Coronary artery disease     History   Social History  . Marital Status: Legally Separated    Spouse Name: N/A    Number of Children: N/A  . Years of Education: N/A   Occupational History  . Disabled    Social History Main Topics  . Smoking status: Never Smoker   . Smokeless tobacco: Never Used  .  Alcohol Use: No  . Drug Use: No  . Sexual Activity: None   Other Topics Concern  . None   Social History Narrative   Patient does not get regular exercise   Past Surgical History  Procedure Laterality Date  . Coronary angioplasty with stent placement    . Colonoscopy  08/06/2011    Procedure: COLONOSCOPY;  Surgeon: Rogene Houston, MD;  [facility]: [facility];  Service: Endoscopy;  Laterality: N/A;  215  . Arm surgery Bilateral     due to fall-pt broke both forearms  . Colonoscopy with propofol N/A 12/31/2012    Procedure: COLONOSCOPY WITH PROPOFOL;  Surgeon: Rogene Houston, MD;  Location: [facility];  Service: Endoscopy;  Laterality: N/A;  in cecum at 0814, total withdrawal time 2min  . Esophagogastroduodenoscopy (egd) with propofol N/A 12/31/2012    Procedure: ESOPHAGOGASTRODUODENOSCOPY (EGD) WITH PROPOFOL;  Surgeon: Rogene Houston, MD;  Location: [facility];  Service: Endoscopy;  Laterality: N/A;  GE junction 37,  . Maloney dilation N/A 12/31/2012    Procedure: Venia Minks DILATION;  Surgeon: Rogene Houston, MD;  Location: [facility];  Service: Endoscopy;  Laterality: N/A;  used a # 54,#56  . Cholecystectomy    . Abdominal hysterectomy    . Esophagogastroduodenoscopy (egd) with propofol N/A 07/01/2013    Procedure: ESOPHAGOGASTRODUODENOSCOPY (EGD) WITH PROPOFOL;  Surgeon: Rogene Houston, MD;  Location: [facility];  Service: Endoscopy;  Laterality:  N/A;  950  . Maloney dilation N/A 07/01/2013    Procedure: Venia Minks DILATION;  Surgeon: Rogene Houston, MD;  Location: [facility];  Service: Endoscopy;  Laterality: N/A;  54/58; no heme present  . Esophageal biopsy N/A 07/01/2013    Procedure: BIOPSY;  Surgeon: Rogene Houston, MD;  Location: [facility];  Service: Endoscopy;  Laterality: N/A;   Past Medical History  Diagnosis Date  . Palpitations   . Tachycardia   . CAD (coronary artery disease)     Stent placement circumflex coronary 2007, catheterization 2008 patent stents. Normal LV function  . Chest pain    . Diabetes mellitus     Insulin dependent  . Dyslipidemia   . Anxiety and depression   . Diabetic polyneuropathy      severe on multiple medications  . 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 MI (myocardial infarction)     2007  . Anxiety   . Depression   . Obstructive sleep apnea     CPAP machine at night  . COPD (chronic obstructive pulmonary disease)   . GERD (gastroesophageal reflux disease)   . CHF (congestive heart failure)   . Headache(784.0)   . Arthritis    BP 112/55  Pulse 75  Resp 16  Wt 236 lb 12.8 oz (107.412 kg)  SpO2 96%  Opioid Risk Score:   Fall Risk Score: Moderate Fall Risk (6-13 points) (previously educated and given handout)  Review of Systems  Respiratory: Positive for cough and shortness of breath.   Neurological: Positive for numbness.       Tingling  Psychiatric/Behavioral: Positive for confusion. The patient is nervous/anxious.   All other systems reviewed and are negative.      Objective:   Physical Exam  Nursing note and vitals reviewed. Constitutional: She is oriented to person, place, and time. She appears well-developed and well-nourished.  HENT:  Head: Normocephalic and atraumatic.  Neck: Normal range of motion. Neck supple.  Cardiovascular: Normal rate and regular rhythm.   Pulmonary/Chest: Effort normal and breath sounds normal.  Musculoskeletal: She exhibits edema.  Normal Muscle Bulk and Muscle Testing Reveals: Upper Extremities: Full ROM and Muscle Strength 5/5 Spinal Forward Flexion 45 Degrees and Extension 20 Degrees  Thoracic and Lumbar Paraspinal Hypersensitivity Lower Extremity: Full ROM and Muscle Strength 5/5 Arises from chair with ease/ Using cadillac walker. Narrow Based Gait      Neurological: She is alert and oriented to person, place, and time.  Skin: Skin is warm and dry.  Psychiatric: She has a normal mood and affect.          Assessment & Plan:  1.Lumbar pain lumbar spondylosis:  Refilled: Opana 30 mg one  tablet every 12 hours as needed #60  2. Severe diabetic poly neuropathy: Continue withTopamax.   20 minutes of face to face patient care time was spent during this visit. All questions were encouraged and answered.   F/U in 1 Month.

## 2013-12-05 ENCOUNTER — Other Ambulatory Visit: Payer: Self-pay | Admitting: Cardiovascular Disease

## 2013-12-05 ENCOUNTER — Encounter: Payer: Medicaid Other | Attending: Physical Medicine & Rehabilitation | Admitting: Registered Nurse

## 2013-12-05 ENCOUNTER — Encounter: Payer: Self-pay | Admitting: Registered Nurse

## 2013-12-05 VITALS — BP 123/64 | HR 71 | Resp 14 | Ht 67.0 in | Wt 236.0 lb

## 2013-12-05 DIAGNOSIS — E1149 Type 2 diabetes mellitus with other diabetic neurological complication: Secondary | ICD-10-CM | POA: Diagnosis not present

## 2013-12-05 DIAGNOSIS — J4489 Other specified chronic obstructive pulmonary disease: Secondary | ICD-10-CM | POA: Insufficient documentation

## 2013-12-05 DIAGNOSIS — E0842 Diabetes mellitus due to underlying condition with diabetic polyneuropathy: Secondary | ICD-10-CM

## 2013-12-05 DIAGNOSIS — F3289 Other specified depressive episodes: Secondary | ICD-10-CM | POA: Diagnosis not present

## 2013-12-05 DIAGNOSIS — J449 Chronic obstructive pulmonary disease, unspecified: Secondary | ICD-10-CM | POA: Insufficient documentation

## 2013-12-05 DIAGNOSIS — M48061 Spinal stenosis, lumbar region without neurogenic claudication: Secondary | ICD-10-CM | POA: Diagnosis not present

## 2013-12-05 DIAGNOSIS — I2510 Atherosclerotic heart disease of native coronary artery without angina pectoris: Secondary | ICD-10-CM | POA: Diagnosis not present

## 2013-12-05 DIAGNOSIS — E1142 Type 2 diabetes mellitus with diabetic polyneuropathy: Secondary | ICD-10-CM

## 2013-12-05 DIAGNOSIS — F329 Major depressive disorder, single episode, unspecified: Secondary | ICD-10-CM | POA: Diagnosis not present

## 2013-12-05 DIAGNOSIS — G4733 Obstructive sleep apnea (adult) (pediatric): Secondary | ICD-10-CM | POA: Diagnosis not present

## 2013-12-05 DIAGNOSIS — K219 Gastro-esophageal reflux disease without esophagitis: Secondary | ICD-10-CM | POA: Insufficient documentation

## 2013-12-05 DIAGNOSIS — F411 Generalized anxiety disorder: Secondary | ICD-10-CM | POA: Diagnosis not present

## 2013-12-05 DIAGNOSIS — I252 Old myocardial infarction: Secondary | ICD-10-CM | POA: Insufficient documentation

## 2013-12-05 DIAGNOSIS — Z5181 Encounter for therapeutic drug level monitoring: Secondary | ICD-10-CM

## 2013-12-05 DIAGNOSIS — E1349 Other specified diabetes mellitus with other diabetic neurological complication: Secondary | ICD-10-CM

## 2013-12-05 DIAGNOSIS — Z79899 Other long term (current) drug therapy: Secondary | ICD-10-CM

## 2013-12-05 DIAGNOSIS — Z794 Long term (current) use of insulin: Secondary | ICD-10-CM | POA: Diagnosis not present

## 2013-12-05 MED ORDER — OXYMORPHONE HCL ER 30 MG PO TB12: 30.0000 mg | ORAL_TABLET | Freq: Two times a day (BID) | ORAL | Status: DC

## 2013-12-05 NOTE — Progress Notes (Signed)
Subjective:    Patient ID: [name], female    DOB: [birth year], 63 y.o.   MRN: [id number]  HPI: [name] is a 63 year old female who returns for follow up for chronic pain and medication refill. She says her pain is located in her stomach and lower back. She says she has been nauseated today, feels as though she has a virus. She rates her pain 5. Her current exercise regimen is performing chair exercises 4-5 times a week, performing stretching exercises and walking for short distances. She has a cadillac walker.   Pain Inventory Average Pain 6 Pain Right Now 5 My pain is constant, burning, dull, stabbing, tingling and aching  In the last 24 hours, has pain interfered with the following? General activity 6 Relation with others 6 Enjoyment of life 7 What TIME of day is your pain at its worst? morning, night Sleep (in general) Fair  Pain is worse with: walking, bending, sitting and some activites Pain improves with: rest and heat/ice Relief from Meds: 6  Mobility use a walker  Function disabled: date disabled 2008  Neuro/Psych bladder control problems bowel control problems numbness tingling dizziness depression anxiety  Prior Studies Any changes since last visit?  no  Physicians involved in your care Any changes since last visit?  no   Family History  Problem Relation Age of Onset  . Cancer    . Coronary artery disease     History   Social History  . Marital Status: Legally Separated    Spouse Name: N/A    Number of Children: N/A  . Years of Education: N/A   Occupational History  . Disabled    Social History Main Topics  . Smoking status: Never Smoker   . Smokeless tobacco: Never Used  . Alcohol Use: No  . Drug Use: No  . Sexual Activity: None   Other Topics Concern  . None   Social History Narrative   Patient does not get regular exercise   Past Surgical History  Procedure Laterality Date  . Coronary angioplasty with stent  placement    . Colonoscopy  08/06/2011    Procedure: COLONOSCOPY;  Surgeon: Rogene Houston, MD;  [facility];  Service: Endoscopy;  Laterality: N/A;  215  . Arm surgery Bilateral     due to fall-pt broke both forearms  . Colonoscopy with propofol N/A 12/31/2012    Procedure: COLONOSCOPY WITH PROPOFOL;  Surgeon: Rogene Houston, MD;  Location: [facility];  Service: Endoscopy;  Laterality: N/A;  in cecum at 0814, total withdrawal time 38mn  . Esophagogastroduodenoscopy (egd) with propofol N/A 12/31/2012    Procedure: ESOPHAGOGASTRODUODENOSCOPY (EGD) WITH PROPOFOL;  Surgeon: NRogene Houston MD;  Location: [facility];  Service: Endoscopy;  Laterality: N/A;  GE junction 37,  . Maloney dilation N/A 12/31/2012    Procedure: MVenia MinksDILATION;  Surgeon: NRogene Houston MD;  Location: [facility];  Service: Endoscopy;  Laterality: N/A;  used a # 54,#56  . Cholecystectomy    . Abdominal hysterectomy    . Esophagogastroduodenoscopy (egd) with propofol N/A 07/01/2013    Procedure: ESOPHAGOGASTRODUODENOSCOPY (EGD) WITH PROPOFOL;  Surgeon: NRogene Houston MD;  [facility]: [facility];  Service: Endoscopy;  Laterality: N/A;  950  . Maloney dilation N/A 07/01/2013    Procedure: MVenia MinksDILATION;  Surgeon: NRogene Houston MD;  [facility]: [facility];  Service: Endoscopy;  Laterality: N/A;  54/58; no heme present  . Esophageal biopsy N/A  07/01/2013    Procedure: BIOPSY;  Surgeon: Rogene Houston, MD;  Location: [facility];  Service: Endoscopy;  Laterality: N/A;   Past Medical History  Diagnosis Date  . Palpitations   . Tachycardia   . CAD (coronary artery disease)     Stent placement circumflex coronary 2007, catheterization 2008 patent stents. Normal LV function  . Chest pain   . Diabetes mellitus     Insulin dependent  . Dyslipidemia   . Anxiety and depression   . Diabetic polyneuropathy      severe on multiple medications  . 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 MI (myocardial infarction)     2007  . Anxiety   . Depression    . Obstructive sleep apnea     CPAP machine at night  . COPD (chronic obstructive pulmonary disease)   . GERD (gastroesophageal reflux disease)   . CHF (congestive heart failure)   . Headache(784.0)   . Arthritis    BP 123/64  Pulse 71  Resp 14  Ht 5' 7"  (1.702 m)  Wt 236 lb (107.049 kg)  BMI 36.95 kg/m2  SpO2 94%  Opioid Risk Score:   Fall Risk Score: Low Fall Risk (0-5 points)  Review of Systems     Objective:   Physical Exam  Nursing note and vitals reviewed. Constitutional: She is oriented to person, place, and time. She appears well-developed and well-nourished.  HENT:  Head: Normocephalic and atraumatic.  Neck: Normal range of motion. Neck supple.  Cardiovascular: Normal rate and regular rhythm.   Pulmonary/Chest: Effort normal and breath sounds normal.  Musculoskeletal:  Normal Muscle Bulk and Muscle testing Reveals: Upper Extremities: Full ROM and Muscle Strength 5/5 Thoracic Paraspinal tenderness: T-6- T-9 Lumbar Paraspinal Tenderness: L-3- L-5 Lower Extremities: Full ROM and Muscle Strength 5/5 Bilateral Lower Extremity Flexion Produces Pain into Lumbar Arises from chair with ease/ Using Cadillac Walker    Neurological: She is alert and oriented to person, place, and time.  Skin: Skin is warm and dry.  Psychiatric: She has a normal mood and affect.          Assessment & Plan:  1.Lumbar pain lumbar spondylosis:  Refilled: Opana 30 mg one tablet every 12 hours as needed #60  2. Severe diabetic poly neuropathy: Continue withTopamax.  3. Insomnia: On Trazodone PCP Following  20 minutes of face to face patient care time was spent during this visit. All questions were encouraged and answered.   F/U in 1 Month.

## 2014-01-02 ENCOUNTER — Encounter: Payer: Medicaid Other | Attending: Physical Medicine & Rehabilitation | Admitting: Registered Nurse

## 2014-01-02 ENCOUNTER — Encounter: Payer: Self-pay | Admitting: Registered Nurse

## 2014-01-02 VITALS — BP 126/70 | HR 78 | Resp 14 | Ht 66.0 in | Wt 235.0 lb

## 2014-01-02 DIAGNOSIS — R51 Headache: Secondary | ICD-10-CM | POA: Insufficient documentation

## 2014-01-02 DIAGNOSIS — I509 Heart failure, unspecified: Secondary | ICD-10-CM | POA: Insufficient documentation

## 2014-01-02 DIAGNOSIS — G4700 Insomnia, unspecified: Secondary | ICD-10-CM | POA: Insufficient documentation

## 2014-01-02 DIAGNOSIS — Z760 Encounter for issue of repeat prescription: Secondary | ICD-10-CM | POA: Insufficient documentation

## 2014-01-02 DIAGNOSIS — G4733 Obstructive sleep apnea (adult) (pediatric): Secondary | ICD-10-CM | POA: Diagnosis not present

## 2014-01-02 DIAGNOSIS — M5489 Other dorsalgia: Secondary | ICD-10-CM | POA: Insufficient documentation

## 2014-01-02 DIAGNOSIS — R002 Palpitations: Secondary | ICD-10-CM | POA: Insufficient documentation

## 2014-01-02 DIAGNOSIS — K219 Gastro-esophageal reflux disease without esophagitis: Secondary | ICD-10-CM | POA: Diagnosis not present

## 2014-01-02 DIAGNOSIS — Z5181 Encounter for therapeutic drug level monitoring: Secondary | ICD-10-CM

## 2014-01-02 DIAGNOSIS — I2510 Atherosclerotic heart disease of native coronary artery without angina pectoris: Secondary | ICD-10-CM | POA: Insufficient documentation

## 2014-01-02 DIAGNOSIS — M79605 Pain in left leg: Secondary | ICD-10-CM | POA: Diagnosis not present

## 2014-01-02 DIAGNOSIS — I252 Old myocardial infarction: Secondary | ICD-10-CM | POA: Diagnosis not present

## 2014-01-02 DIAGNOSIS — R079 Chest pain, unspecified: Secondary | ICD-10-CM | POA: Diagnosis not present

## 2014-01-02 DIAGNOSIS — M47896 Other spondylosis, lumbar region: Secondary | ICD-10-CM | POA: Diagnosis not present

## 2014-01-02 DIAGNOSIS — M1990 Unspecified osteoarthritis, unspecified site: Secondary | ICD-10-CM | POA: Insufficient documentation

## 2014-01-02 DIAGNOSIS — Z1401 Asymptomatic hemophilia A carrier: Secondary | ICD-10-CM | POA: Diagnosis not present

## 2014-01-02 DIAGNOSIS — M79604 Pain in right leg: Secondary | ICD-10-CM | POA: Diagnosis not present

## 2014-01-02 DIAGNOSIS — F418 Other specified anxiety disorders: Secondary | ICD-10-CM | POA: Insufficient documentation

## 2014-01-02 DIAGNOSIS — E785 Hyperlipidemia, unspecified: Secondary | ICD-10-CM | POA: Insufficient documentation

## 2014-01-02 DIAGNOSIS — E1142 Type 2 diabetes mellitus with diabetic polyneuropathy: Secondary | ICD-10-CM | POA: Diagnosis not present

## 2014-01-02 DIAGNOSIS — E0842 Diabetes mellitus due to underlying condition with diabetic polyneuropathy: Secondary | ICD-10-CM

## 2014-01-02 DIAGNOSIS — Z794 Long term (current) use of insulin: Secondary | ICD-10-CM | POA: Insufficient documentation

## 2014-01-02 DIAGNOSIS — R000 Tachycardia, unspecified: Secondary | ICD-10-CM | POA: Insufficient documentation

## 2014-01-02 DIAGNOSIS — M4806 Spinal stenosis, lumbar region: Secondary | ICD-10-CM

## 2014-01-02 DIAGNOSIS — Z79899 Other long term (current) drug therapy: Secondary | ICD-10-CM

## 2014-01-02 DIAGNOSIS — G8929 Other chronic pain: Secondary | ICD-10-CM | POA: Insufficient documentation

## 2014-01-02 DIAGNOSIS — J449 Chronic obstructive pulmonary disease, unspecified: Secondary | ICD-10-CM | POA: Diagnosis not present

## 2014-01-02 DIAGNOSIS — M48061 Spinal stenosis, lumbar region without neurogenic claudication: Secondary | ICD-10-CM

## 2014-01-02 MED ORDER — OXYMORPHONE HCL ER 30 MG PO TB12: 30.0000 mg | ORAL_TABLET | Freq: Two times a day (BID) | ORAL | Status: DC

## 2014-01-02 NOTE — Progress Notes (Signed)
Subjective:    Patient ID: [name], female    DOB: 04/24/1950, 63 y.o.   MRN: [id number]  HPI: [name] is a 63 year old female who returns for follow up for chronic pain and medication refill. She says her pain is located in her mid-lower back, bilateral upper and lower extremities.She rates her pain 7. Her current exercise regime is performing chair exercises 3-4 times a week.  Pain Inventory Average Pain 8 Pain Right Now 7 My pain is constant, sharp, burning, stabbing, tingling and aching  In the last 24 hours, has pain interfered with the following? General activity 7 Relation with others 5 Enjoyment of life 8 What TIME of day is your pain at its worst? night Sleep (in general) Poor  Pain is worse with: walking, bending, standing and some activites Pain improves with: heat/ice and medication Relief from Meds: 4  Mobility use a walker how many minutes can you walk? 5 minutes ability to climb steps?  no do you drive?  no  Function disabled: date disabled .  Neuro/Psych weakness tingling trouble walking dizziness depression anxiety  Prior Studies Any changes since last visit?  no  Physicians involved in your care Any changes since last visit?  no   Family History  Problem Relation Age of Onset  . Cancer    . Coronary artery disease     History   Social History  . Marital Status: Legally Separated    Spouse Name: N/A    Number of Children: N/A  . Years of Education: N/A   Occupational History  . Disabled    Social History Main Topics  . Smoking status: Never Smoker   . Smokeless tobacco: Never Used  . Alcohol Use: No  . Drug Use: No  . Sexual Activity: None   Other Topics Concern  . None   Social History Narrative   Patient does not get regular exercise   Past Surgical History  Procedure Laterality Date  . Coronary angioplasty with stent placement    . Colonoscopy  08/06/2011    Procedure: COLONOSCOPY;  Surgeon: Rogene Houston, MD;  [facility];  Service: Endoscopy;  Laterality: N/A;  215  . Arm surgery Bilateral     due to fall-pt broke both forearms  . Colonoscopy with propofol N/A 12/31/2012    Procedure: COLONOSCOPY WITH PROPOFOL;  Surgeon: Rogene Houston, MD;  Location: [facility];  Service: Endoscopy;  Laterality: N/A;  in cecum at 0814, total withdrawal time 78min  . Esophagogastroduodenoscopy (egd) with propofol N/A 12/31/2012    Procedure: ESOPHAGOGASTRODUODENOSCOPY (EGD) WITH PROPOFOL;  Surgeon: Rogene Houston, MD;  [facility];  Service: Endoscopy;  Laterality: N/A;  GE junction 37,  . Maloney dilation N/A 12/31/2012    Procedure: Venia Minks DILATION;  Surgeon: Rogene Houston, MD;  [facility];  Service: Endoscopy;  Laterality: N/A;  used a # 54,#56  . Cholecystectomy    . Abdominal hysterectomy    . Esophagogastroduodenoscopy (egd) with propofol N/A 07/01/2013    Procedure: ESOPHAGOGASTRODUODENOSCOPY (EGD) WITH PROPOFOL;  Surgeon: Rogene Houston, MD;  [facility];  Service: Endoscopy;  Laterality: N/A;  950  . Maloney dilation N/A 07/01/2013    Procedure: Venia Minks DILATION;  Surgeon: Rogene Houston, MD;  [facility];  Service: Endoscopy;  Laterality: N/A;  54/58; no heme present  . Esophageal biopsy N/A 07/01/2013    Procedure: BIOPSY;  Surgeon: Rogene Houston, MD;  Location: [facility];  Service: Endoscopy;  Laterality: N/A;   Past Medical History  Diagnosis Date  . Palpitations   . Tachycardia   . CAD (coronary artery disease)     Stent placement circumflex coronary 2007, catheterization 2008 patent stents. Normal LV function  . Chest pain   . Diabetes mellitus     Insulin dependent  . Dyslipidemia   . Anxiety and depression   . Diabetic polyneuropathy      severe on multiple medications  . 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 MI (myocardial infarction)     2007  . Anxiety   . Depression   . Obstructive sleep apnea     CPAP machine at night  . COPD (chronic  obstructive pulmonary disease)   . GERD (gastroesophageal reflux disease)   . CHF (congestive heart failure)   . Headache(784.0)   . Arthritis    BP 126/70  Pulse 78  Resp 14  Ht 5\' 6"  (1.676 m)  Wt 235 lb (106.595 kg)  BMI 37.95 kg/m2  SpO2 98%  Opioid Risk Score:   Fall Risk Score: Moderate Fall Risk (6-13 points)  Review of Systems     Objective:   Physical Exam  Nursing note and vitals reviewed. Constitutional: She is oriented to person, place, and time. She appears well-developed and well-nourished.  HENT:  Head: Normocephalic and atraumatic.  Neck: Normal range of motion. Neck supple.  Cardiovascular: Normal rate and regular rhythm.   Pulmonary/Chest: Effort normal and breath sounds normal.  Musculoskeletal:  Normal Muscle Bulk and Muscle Testing Reveals: Upper Extremities : Full ROM and Muscle Strength 5/5 Lumbar Paraspinal Tenderness: L-3- L-5 Lower Extremities: Full ROM and Muscle Strength 5/5 Bilateral Lower Extremities Flexion Produces Pain into Lower back Arises from chair with ease/ Narrow Based Gait   Neurological: She is alert and oriented to person, place, and time.  Skin: Skin is warm and dry.  Psychiatric: She has a normal mood and affect.          Assessment & Plan:  1.Lumbar pain lumbar spondylosis:  Refilled: Opana 30 mg one tablet every 12 hours as needed #60  2. Severe diabetic poly neuropathy: Continue withTopamax.  3. Insomnia: On Trazodone PCP Following  20 minutes of face to face patient care time was spent during this visit. All questions were encouraged and answered.  F/U in 1 Month.

## 2014-01-03 ENCOUNTER — Telehealth: Payer: Self-pay | Admitting: *Deleted

## 2014-01-03 NOTE — Telephone Encounter (Signed)
Patient called to inform us she will need PA for meds ASAP.

## 2014-01-04 ENCOUNTER — Telehealth: Payer: Self-pay | Admitting: *Deleted

## 2014-01-04 ENCOUNTER — Other Ambulatory Visit: Payer: Self-pay | Admitting: Cardiovascular Disease

## 2014-01-04 MED ORDER — METHOCARBAMOL 500 MG PO TABS: 500.0000 mg | ORAL_TABLET | Freq: Three times a day (TID) | ORAL | Status: DC

## 2014-01-04 NOTE — Telephone Encounter (Signed)
recvd fax request for Robaxin 500 mg 1 tablet TID.  Sent in electronically

## 2014-01-04 NOTE — Telephone Encounter (Signed)
Called and left message for a nurse to call her back.  It is about her prior authorization for her meds.  Which were faxed over to Medicaid 2x's - 10/21 marked fax as urgent.  Please process asap

## 2014-01-04 NOTE — Telephone Encounter (Signed)
Pt made aware Josem Kaufmann is being worked on; she spoke understanding. She would like if someone would call over to the pharmacy and let them know about the authorization being taken care of if she may have some meds now .

## 2014-01-05 ENCOUNTER — Telehealth: Payer: Self-pay | Admitting: Physical Medicine & Rehabilitation

## 2014-01-05 NOTE — Telephone Encounter (Signed)
Patient needs a pre auth for her medication [facility] suggested her to call us, she will be out of her med tonight

## 2014-01-06 NOTE — Telephone Encounter (Signed)
Called patient and told her I got the Prior Auth thru [place] for her Opana ER 30 mg tablets #60. Prior Auth # [id number]

## 2014-01-10 ENCOUNTER — Ambulatory Visit: Payer: Medicaid Other

## 2014-01-12 ENCOUNTER — Ambulatory Visit: Payer: Medicaid Other | Admitting: Podiatrist

## 2014-01-18 ENCOUNTER — Ambulatory Visit (INDEPENDENT_AMBULATORY_CARE_PROVIDER_SITE_OTHER): Payer: Medicaid Other | Admitting: Cardiovascular Disease

## 2014-01-18 ENCOUNTER — Encounter: Payer: Self-pay | Admitting: Cardiovascular Disease

## 2014-01-18 VITALS — BP 122/66 | HR 78 | Ht 66.0 in | Wt 246.0 lb

## 2014-01-18 DIAGNOSIS — I5032 Chronic diastolic (congestive) heart failure: Secondary | ICD-10-CM

## 2014-01-18 DIAGNOSIS — R059 Cough, unspecified: Secondary | ICD-10-CM

## 2014-01-18 DIAGNOSIS — I2510 Atherosclerotic heart disease of native coronary artery without angina pectoris: Secondary | ICD-10-CM

## 2014-01-18 DIAGNOSIS — I10 Essential (primary) hypertension: Secondary | ICD-10-CM

## 2014-01-18 DIAGNOSIS — R05 Cough: Secondary | ICD-10-CM

## 2014-01-18 NOTE — Progress Notes (Signed)
Patient ID: [name], female   DOB: 1950-04-02, 63 y.o.   MRN: [id number]      SUBJECTIVE: Mrs. Laske has a history of an MI in 2007 with PCI of the circumflex. She also has IDDM, sleep apnea, irritable bowel syndrome, GERD, chronic diastolic heart failure, tachycardia-palpitations, and dyslipidemia. She has been struggling with a cough for the past 3-4 months but no underlying etiology has been found. She has been treated for gastroesophageal reflux disease with both Protonix and most recently ranitidine. She has a chest ache which is constant which does not feel like chest pain prior to her heart attack. She also has a runny nose. She has not been tested for allergies before. She has been on lisinopril since her MI.  Review of Systems: As per "subjective", otherwise negative.  Allergies  Allergen Reactions  . Nitrofuran Derivatives Itching and Swelling  . Amphetamine-Dextroamphet Er Swelling  . Pregabalin Swelling  . Topiramate Other (See Comments)    Tongue tingle  . Verelan [Verapamil] Rash    Current Outpatient Prescriptions  Medication Sig Dispense Refill  . ALPRAZolam (XANAX) 1 MG tablet Take 1 mg by mouth 4 (four) times daily.      Marland Kitchen aspirin EC 81 MG tablet Take 81 mg by mouth daily.    Marland Kitchen atorvastatin (LIPITOR) 40 MG tablet Take 40 mg by mouth daily.    . beclomethasone (QVAR) 40 MCG/ACT inhaler Inhale 1 puff into the lungs 2 (two) times daily.    Marland Kitchen estrogens, conjugated, (PREMARIN) 1.25 MG tablet Take 1.25 mg by mouth daily.     . fluticasone-salmeterol (ADVAIR HFA) 115-21 MCG/ACT inhaler Inhale 2 puffs into the lungs 2 (two) times daily.    Marland Kitchen ibuprofen (ADVIL,MOTRIN) 200 MG tablet Take 200 mg by mouth daily as needed for headache.    . insulin glargine (LANTUS) 100 UNIT/ML injection Inject 30-60 Units into the skin 2 (two) times daily.     . insulin lispro (HUMALOG) 100 UNIT/ML injection Inject 2-10 Units into the skin 3 (three) times daily before meals. Per patient  this is now uses as a sliding scale.    . lidocaine (XYLOCAINE) 5 % ointment Apply 1 application topically at bedtime.     Marland Kitchen lisinopril (PRINIVIL,ZESTRIL) 10 MG tablet Take by mouth daily.    . methocarbamol (ROBAXIN) 500 MG tablet Take 1 tablet (500 mg total) by mouth 3 (three) times daily. 90 tablet 2  . nitroGLYCERIN (NITROSTAT) 0.4 MG SL tablet Place 1 tablet (0.4 mg total) under the tongue every 5 (five) minutes x 3 doses as needed. For chest pains 25 tablet 3  . ondansetron (ZOFRAN) 4 MG tablet Take 4 mg by mouth every 6 (six) hours as needed for nausea or vomiting.    Marland Kitchen oxymorphone (OPANA ER) 30 MG 12 hr tablet Take 1 tablet (30 mg total) by mouth every 12 (twelve) hours. 60 tablet 0  . pantoprazole (PROTONIX) 40 MG tablet Take 1 tablet (40 mg total) by mouth daily. 60 tablet 5  . potassium chloride (K-DUR,KLOR-CON) 10 MEQ tablet TAKE 2 TABLETS BY MOUTH 2 TIMES DAILY. 120 tablet 6  . psyllium (METAMUCIL SMOOTH TEXTURE) 28 % packet Take 1 packet by mouth at bedtime.    . topiramate (TOPAMAX) 200 MG tablet Take 200 mg by mouth daily.    . TOPROL XL 50 MG 24 hr tablet TAKE 1 TABLET BY MOUTH DAILY WITH OR IMMEDIATELY FOLLOWING A MEAL. 30 tablet 6  . traZODone (DESYREL) 25 mg TABS  tablet Take 0.5 tablets (25 mg total) by mouth at bedtime. 30 tablet 2  . [DISCONTINUED] diltiazem (TIAZAC) 180 MG 24 hr capsule Take 1 capsule (180 mg total) by mouth daily. 30 capsule 1  . [DISCONTINUED] potassium chloride (K-DUR) 10 MEQ tablet Take 2 tablets (20 mEq total) by mouth 2 (two) times daily.     No current facility-administered medications for this visit.    Past Medical History  Diagnosis Date  . Palpitations   . Tachycardia   . CAD (coronary artery disease)     Stent placement circumflex coronary 2007, catheterization 2008 patent stents. Normal LV function  . Chest pain   . Diabetes mellitus     Insulin dependent  . Dyslipidemia   . Anxiety and depression   . Diabetic polyneuropathy       severe on multiple medications  . 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 MI (myocardial infarction)     2007  . Anxiety   . Depression   . Obstructive sleep apnea     CPAP machine at night  . COPD (chronic obstructive pulmonary disease)   . GERD (gastroesophageal reflux disease)   . CHF (congestive heart failure)   . Headache(784.0)   . Arthritis     Past Surgical History  Procedure Laterality Date  . Coronary angioplasty with stent placement    . Colonoscopy  08/06/2011    Procedure: COLONOSCOPY;  Surgeon: Rogene Houston, MD;  [facility];  Service: Endoscopy;  Laterality: N/A;  215  . Arm surgery Bilateral     due to fall-pt broke both forearms  . Colonoscopy with propofol N/A 12/31/2012    Procedure: COLONOSCOPY WITH PROPOFOL;  Surgeon: Rogene Houston, MD;  Location: [facility];  Service: Endoscopy;  Laterality: N/A;  in cecum at 0814, total withdrawal time 56mn  . Esophagogastroduodenoscopy (egd) with propofol N/A 12/31/2012    Procedure: ESOPHAGOGASTRODUODENOSCOPY (EGD) WITH PROPOFOL;  Surgeon: NRogene Houston MD;  [facility];  Service: Endoscopy;  Laterality: N/A;  GE junction 37,  . Maloney dilation N/A 12/31/2012    Procedure: MVenia MinksDILATION;  Surgeon: NRogene Houston MD;  Location[facility];  Service: Endoscopy;  Laterality: N/A;  used a # 54,#56  . Cholecystectomy    . Abdominal hysterectomy    . Esophagogastroduodenoscopy (egd) with propofol N/A 07/01/2013    Procedure: ESOPHAGOGASTRODUODENOSCOPY (EGD) WITH PROPOFOL;  Surgeon: NRogene Houston MD;  [facility]: [facility];  Service: Endoscopy;  Laterality: N/A;  950  . Maloney dilation N/A 07/01/2013    Procedure: MVenia MinksDILATION;  Surgeon: NRogene Houston MD;  [facility]: [facility];  Service: Endoscopy;  Laterality: N/A;  54/58; no heme present  . Esophageal biopsy N/A 07/01/2013    Procedure: BIOPSY;  Surgeon: NRogene Houston MD;  Location: [facility];  Service: Endoscopy;  Laterality: N/A;    History   Social History  .  Marital Status: Legally Separated    Spouse Name: N/A    Number of Children: N/A  . Years of Education: N/A   Occupational History  . Disabled    Social History Main Topics  . Smoking status: Never Smoker   . Smokeless tobacco: Never Used  . Alcohol Use: No  . Drug Use: No  . Sexual Activity: Not on file   Other Topics Concern  . Not on file   Social History Narrative   Patient does not get regular exercise     Filed Vitals:   01/18/14 1343  Height: 5' 6"  (1.676 m)  Weight: 246 lb (111.585 kg)   BP 122/66 Pulse 78   PHYSICAL EXAM General: NAD HEENT: Normal. Neck: No JVD, no thyromegaly. Lungs: Clear to auscultation bilaterally with normal respiratory effort. CV: Nondisplaced PMI.  Regular rate and rhythm, normal S1/S2, no S3/S4, no murmur. No pretibial or periankle edema.  No carotid bruit.  Normal pedal pulses.  Abdomen: Soft, nontender, no hepatosplenomegaly, no distention.  Neurologic: Alert and oriented x 3.  Psych: Normal affect. Skin: Normal. Musculoskeletal: Normal range of motion, no gross deformities. Extremities: No clubbing or cyanosis.   ECG: Most recent ECG reviewed.      ASSESSMENT AND PLAN: 1. CAD: Stable ischemic heart disease. Continue aspirin, Lipitor, and metoprolol. 2. Chronic diastolic heart failure: Blood pressure is well controlled and she is euvolemic. No changes to therapy. 3. Essential hypertension: Controlled on present therapy which includes lisinopril. No changes. 4. Chronic cough: She recently started ranitidine. One could consider allergy testing given her runny nose and probable post-nasal drip.  Dispo: f/u 6 months.   Kate Sable, M.D., F.A.C.C.

## 2014-01-18 NOTE — Patient Instructions (Signed)
Your physician wants you to follow-up in: 6 months with Dr. Koneswaran. You will receive a reminder letter in the mail two months in advance. If you don't receive a letter, please call our office to schedule the follow-up appointment.  Your physician recommends that you continue on your current medications as directed. Please refer to the Current Medication list given to you today.  Thank you for choosing [facility]!   

## 2014-01-18 NOTE — Addendum Note (Signed)
Addended by: Hilarie Fredrickson T on: 01/18/2014 02:07 PM   Modules accepted: Level of Service

## 2014-02-02 ENCOUNTER — Encounter: Payer: Medicaid Other | Attending: Physical Medicine & Rehabilitation | Admitting: Registered Nurse

## 2014-02-02 ENCOUNTER — Encounter: Payer: Self-pay | Admitting: Registered Nurse

## 2014-02-02 VITALS — BP 119/43 | HR 74 | Resp 14 | Ht 66.0 in | Wt 251.0 lb

## 2014-02-02 DIAGNOSIS — R079 Chest pain, unspecified: Secondary | ICD-10-CM | POA: Diagnosis not present

## 2014-02-02 DIAGNOSIS — M47896 Other spondylosis, lumbar region: Secondary | ICD-10-CM | POA: Insufficient documentation

## 2014-02-02 DIAGNOSIS — F418 Other specified anxiety disorders: Secondary | ICD-10-CM | POA: Insufficient documentation

## 2014-02-02 DIAGNOSIS — M79604 Pain in right leg: Secondary | ICD-10-CM | POA: Insufficient documentation

## 2014-02-02 DIAGNOSIS — I509 Heart failure, unspecified: Secondary | ICD-10-CM | POA: Diagnosis not present

## 2014-02-02 DIAGNOSIS — R51 Headache: Secondary | ICD-10-CM | POA: Insufficient documentation

## 2014-02-02 DIAGNOSIS — E1142 Type 2 diabetes mellitus with diabetic polyneuropathy: Secondary | ICD-10-CM | POA: Diagnosis not present

## 2014-02-02 DIAGNOSIS — M1990 Unspecified osteoarthritis, unspecified site: Secondary | ICD-10-CM | POA: Diagnosis not present

## 2014-02-02 DIAGNOSIS — G8929 Other chronic pain: Secondary | ICD-10-CM | POA: Diagnosis not present

## 2014-02-02 DIAGNOSIS — Z760 Encounter for issue of repeat prescription: Secondary | ICD-10-CM | POA: Insufficient documentation

## 2014-02-02 DIAGNOSIS — M79605 Pain in left leg: Secondary | ICD-10-CM | POA: Insufficient documentation

## 2014-02-02 DIAGNOSIS — R000 Tachycardia, unspecified: Secondary | ICD-10-CM | POA: Insufficient documentation

## 2014-02-02 DIAGNOSIS — M48061 Spinal stenosis, lumbar region without neurogenic claudication: Secondary | ICD-10-CM

## 2014-02-02 DIAGNOSIS — R002 Palpitations: Secondary | ICD-10-CM | POA: Diagnosis not present

## 2014-02-02 DIAGNOSIS — G4733 Obstructive sleep apnea (adult) (pediatric): Secondary | ICD-10-CM | POA: Insufficient documentation

## 2014-02-02 DIAGNOSIS — Z794 Long term (current) use of insulin: Secondary | ICD-10-CM | POA: Diagnosis not present

## 2014-02-02 DIAGNOSIS — G4700 Insomnia, unspecified: Secondary | ICD-10-CM | POA: Diagnosis not present

## 2014-02-02 DIAGNOSIS — E785 Hyperlipidemia, unspecified: Secondary | ICD-10-CM | POA: Insufficient documentation

## 2014-02-02 DIAGNOSIS — M5489 Other dorsalgia: Secondary | ICD-10-CM | POA: Insufficient documentation

## 2014-02-02 DIAGNOSIS — J449 Chronic obstructive pulmonary disease, unspecified: Secondary | ICD-10-CM | POA: Diagnosis not present

## 2014-02-02 DIAGNOSIS — I2510 Atherosclerotic heart disease of native coronary artery without angina pectoris: Secondary | ICD-10-CM | POA: Insufficient documentation

## 2014-02-02 DIAGNOSIS — E0842 Diabetes mellitus due to underlying condition with diabetic polyneuropathy: Secondary | ICD-10-CM

## 2014-02-02 DIAGNOSIS — Z5181 Encounter for therapeutic drug level monitoring: Secondary | ICD-10-CM

## 2014-02-02 DIAGNOSIS — Z79899 Other long term (current) drug therapy: Secondary | ICD-10-CM

## 2014-02-02 DIAGNOSIS — K219 Gastro-esophageal reflux disease without esophagitis: Secondary | ICD-10-CM | POA: Diagnosis not present

## 2014-02-02 DIAGNOSIS — M4806 Spinal stenosis, lumbar region: Secondary | ICD-10-CM

## 2014-02-02 DIAGNOSIS — Z1401 Asymptomatic hemophilia A carrier: Secondary | ICD-10-CM | POA: Diagnosis not present

## 2014-02-02 DIAGNOSIS — I252 Old myocardial infarction: Secondary | ICD-10-CM | POA: Insufficient documentation

## 2014-02-02 MED ORDER — OXYMORPHONE HCL ER 30 MG PO TB12: 30.0000 mg | ORAL_TABLET | Freq: Two times a day (BID) | ORAL | Status: DC

## 2014-02-02 NOTE — Progress Notes (Signed)
Subjective:    Patient ID: [name], female    DOB: Jul 18, 1950, 63 y.o.   MRN: [id number]  HPI: [name] is a 63 year old female who returns for follow up for chronic pain and medication refill. She says her pain is located in her upper extremities, mid-lower back,  and lower extremities. Also complaining of burning in her hands and feet she says she is on gabapentin PCP following. Instructed to bring updated med-list on next visit. She verbalizes understanding.She rates her pain 7. Her current exercise regime is performing chair exercises 3-4 times a  Day. She says she has a few pills in her pill container, instructed to bring all medications with her. She verbalizes understanding.  Pain Inventory Average Pain 8 Pain Right Now 7 My pain is sharp, burning, stabbing, tingling and aching  In the last 24 hours, has pain interfered with the following? General activity 7 Relation with others 5 Enjoyment of life 8 What TIME of day is your pain at its worst? night Sleep (in general) Poor  Pain is worse with: walking, bending, standing and some activites Pain improves with: heat/ice and medication Relief from Meds: 4  Mobility use a cane use a walker ability to climb steps?  no do you drive?  no Do you have any goals in this area?  yes  Function disabled: date disabled . I need assistance with the following:  meal prep, household duties and shopping  Neuro/Psych weakness tingling dizziness depression anxiety  Prior Studies Any changes since last visit?  no  Physicians involved in your care Any changes since last visit?  no   Family History  Problem Relation Age of Onset  . Cancer    . Coronary artery disease     History   Social History  . Marital Status: Legally Separated    Spouse Name: N/A    Number of Children: N/A  . Years of Education: N/A   Occupational History  . Disabled    Social History Main Topics  . Smoking status: Never Smoker    . Smokeless tobacco: Never Used  . Alcohol Use: No  . Drug Use: No  . Sexual Activity: None   Other Topics Concern  . None   Social History Narrative   Patient does not get regular exercise   Past Surgical History  Procedure Laterality Date  . Coronary angioplasty with stent placement    . Colonoscopy  08/06/2011    Procedure: COLONOSCOPY;  Surgeon: Rogene Houston, MD;  [facility];  Service: Endoscopy;  Laterality: N/A;  215  . Arm surgery Bilateral     due to fall-pt broke both forearms  . Colonoscopy with propofol N/A 12/31/2012    Procedure: COLONOSCOPY WITH PROPOFOL;  Surgeon: Rogene Houston, MD;  Location: [facility];  Service: Endoscopy;  Laterality: N/A;  in cecum at 0814, total withdrawal time 70min  . Esophagogastroduodenoscopy (egd) with propofol N/A 12/31/2012    Procedure: ESOPHAGOGASTRODUODENOSCOPY (EGD) WITH PROPOFOL;  Surgeon: Rogene Houston, MD;  Location: [facility];  Service: Endoscopy;  Laterality: N/A;  GE junction 37,  . Maloney dilation N/A 12/31/2012    Procedure: Venia Minks DILATION;  Surgeon: Rogene Houston, MD;  Location: [facility];  Service: Endoscopy;  Laterality: N/A;  used a # 54,#56  . Cholecystectomy    . Abdominal hysterectomy    . Esophagogastroduodenoscopy (egd) with propofol N/A 07/01/2013    Procedure: ESOPHAGOGASTRODUODENOSCOPY (EGD) WITH PROPOFOL;  Surgeon:  Rogene Houston, MD;  Location: [facility];  Service: Endoscopy;  Laterality: N/A;  950  . Maloney dilation N/A 07/01/2013    Procedure: Venia Minks DILATION;  Surgeon: Rogene Houston, MD;  Location: [facility];  Service: Endoscopy;  Laterality: N/A;  54/58; no heme present  . Esophageal biopsy N/A 07/01/2013    Procedure: BIOPSY;  Surgeon: Rogene Houston, MD;  Location: [facility];  Service: Endoscopy;  Laterality: N/A;   Past Medical History  Diagnosis Date  . Palpitations   . Tachycardia   . CAD (coronary artery disease)     Stent placement circumflex coronary 2007, catheterization 2008 patent  stents. Normal LV function  . Chest pain   . Diabetes mellitus     Insulin dependent  . Dyslipidemia   . Anxiety and depression   . Diabetic polyneuropathy      severe on multiple medications  . 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 MI (myocardial infarction)     2007  . Anxiety   . Depression   . Obstructive sleep apnea     CPAP machine at night  . COPD (chronic obstructive pulmonary disease)   . GERD (gastroesophageal reflux disease)   . CHF (congestive heart failure)   . Headache(784.0)   . Arthritis    BP 119/43 mmHg  Pulse 74  Resp 14  Ht 5\' 6"  (1.676 m)  Wt 251 lb (113.853 kg)  BMI 40.53 kg/m2  SpO2 91%  Opioid Risk Score:   Fall Risk Score: Moderate Fall Risk (6-13 points) Review of Systems  Respiratory: Positive for cough.   Gastrointestinal: Positive for nausea.  Genitourinary: Positive for dysuria.  Neurological: Positive for dizziness and weakness.       Tingling   Psychiatric/Behavioral: Positive for dysphoric mood. The patient is nervous/anxious.   All other systems reviewed and are negative.      Objective:   Physical Exam  Constitutional: She is oriented to person, place, and time. She appears well-developed and well-nourished.  HENT:  Head: Normocephalic and atraumatic.  Neck: Normal range of motion. Neck supple.  Cardiovascular: Normal rate and regular rhythm.   Pulmonary/Chest: Effort normal and breath sounds normal.  Musculoskeletal: She exhibits edema.  Upper Extremities: Full ROM and Muscle Strength 4/5 Thoracic and Lumbar Hypersensitivity Lower Extremities: Full ROM and Muscle strength 5/5 Bilateral Lower extremities Flexion Produces pain into Lumbar Arises from chair slowly/ antalgic gait    Neurological: She is alert and oriented to person, place, and time.  Skin: Skin is warm and dry.  Psychiatric: She has a normal mood and affect.  Nursing note and vitals reviewed.         Assessment & Plan:  1.Lumbar pain lumbar spondylosis:   Refilled: Opana 30 mg one tablet every 12 hours as needed #60  2. Severe diabetic poly neuropathy: Continue withTopamax.  3. Insomnia: On Trazodone PCP Following  20 minutes of face to face patient care time was spent during this visit. All questions were encouraged and answered.  F/U in 1 Month.

## 2014-02-07 ENCOUNTER — Other Ambulatory Visit (INDEPENDENT_AMBULATORY_CARE_PROVIDER_SITE_OTHER): Payer: Self-pay | Admitting: Internal Medicine

## 2014-03-03 ENCOUNTER — Other Ambulatory Visit: Payer: Self-pay | Admitting: Physical Medicine & Rehabilitation

## 2014-03-03 ENCOUNTER — Encounter: Payer: Medicaid Other | Attending: Physical Medicine & Rehabilitation

## 2014-03-03 ENCOUNTER — Ambulatory Visit (HOSPITAL_BASED_OUTPATIENT_CLINIC_OR_DEPARTMENT_OTHER): Payer: Medicaid Other | Admitting: Physical Medicine & Rehabilitation

## 2014-03-03 ENCOUNTER — Encounter: Payer: Self-pay | Admitting: Physical Medicine & Rehabilitation

## 2014-03-03 VITALS — BP 118/51 | HR 70 | Resp 14

## 2014-03-03 DIAGNOSIS — R002 Palpitations: Secondary | ICD-10-CM | POA: Insufficient documentation

## 2014-03-03 DIAGNOSIS — K219 Gastro-esophageal reflux disease without esophagitis: Secondary | ICD-10-CM | POA: Diagnosis not present

## 2014-03-03 DIAGNOSIS — E1142 Type 2 diabetes mellitus with diabetic polyneuropathy: Secondary | ICD-10-CM

## 2014-03-03 DIAGNOSIS — I509 Heart failure, unspecified: Secondary | ICD-10-CM | POA: Diagnosis not present

## 2014-03-03 DIAGNOSIS — Z5181 Encounter for therapeutic drug level monitoring: Secondary | ICD-10-CM

## 2014-03-03 DIAGNOSIS — G8929 Other chronic pain: Secondary | ICD-10-CM | POA: Diagnosis not present

## 2014-03-03 DIAGNOSIS — M5489 Other dorsalgia: Secondary | ICD-10-CM | POA: Insufficient documentation

## 2014-03-03 DIAGNOSIS — E785 Hyperlipidemia, unspecified: Secondary | ICD-10-CM | POA: Diagnosis not present

## 2014-03-03 DIAGNOSIS — Z1401 Asymptomatic hemophilia A carrier: Secondary | ICD-10-CM | POA: Insufficient documentation

## 2014-03-03 DIAGNOSIS — M47896 Other spondylosis, lumbar region: Secondary | ICD-10-CM | POA: Diagnosis not present

## 2014-03-03 DIAGNOSIS — F418 Other specified anxiety disorders: Secondary | ICD-10-CM | POA: Diagnosis not present

## 2014-03-03 DIAGNOSIS — G4733 Obstructive sleep apnea (adult) (pediatric): Secondary | ICD-10-CM | POA: Diagnosis not present

## 2014-03-03 DIAGNOSIS — R51 Headache: Secondary | ICD-10-CM | POA: Diagnosis not present

## 2014-03-03 DIAGNOSIS — I252 Old myocardial infarction: Secondary | ICD-10-CM | POA: Diagnosis not present

## 2014-03-03 DIAGNOSIS — I2510 Atherosclerotic heart disease of native coronary artery without angina pectoris: Secondary | ICD-10-CM | POA: Insufficient documentation

## 2014-03-03 DIAGNOSIS — M47816 Spondylosis without myelopathy or radiculopathy, lumbar region: Secondary | ICD-10-CM

## 2014-03-03 DIAGNOSIS — R000 Tachycardia, unspecified: Secondary | ICD-10-CM | POA: Diagnosis not present

## 2014-03-03 DIAGNOSIS — G4700 Insomnia, unspecified: Secondary | ICD-10-CM | POA: Insufficient documentation

## 2014-03-03 DIAGNOSIS — Z794 Long term (current) use of insulin: Secondary | ICD-10-CM | POA: Insufficient documentation

## 2014-03-03 DIAGNOSIS — Z760 Encounter for issue of repeat prescription: Secondary | ICD-10-CM | POA: Diagnosis present

## 2014-03-03 DIAGNOSIS — J449 Chronic obstructive pulmonary disease, unspecified: Secondary | ICD-10-CM | POA: Diagnosis not present

## 2014-03-03 DIAGNOSIS — R079 Chest pain, unspecified: Secondary | ICD-10-CM | POA: Diagnosis not present

## 2014-03-03 DIAGNOSIS — M79604 Pain in right leg: Secondary | ICD-10-CM | POA: Diagnosis not present

## 2014-03-03 DIAGNOSIS — M79605 Pain in left leg: Secondary | ICD-10-CM | POA: Diagnosis not present

## 2014-03-03 DIAGNOSIS — Z79899 Other long term (current) drug therapy: Secondary | ICD-10-CM

## 2014-03-03 DIAGNOSIS — M1990 Unspecified osteoarthritis, unspecified site: Secondary | ICD-10-CM | POA: Diagnosis not present

## 2014-03-03 MED ORDER — OXYMORPHONE HCL ER 30 MG PO TB12: 30.0000 mg | ORAL_TABLET | Freq: Two times a day (BID) | ORAL | Status: DC

## 2014-03-03 NOTE — Progress Notes (Signed)
Subjective:    Patient ID: [name], female    DOB: 1950-12-04, 63 y.o.   MRN: [id number]  HPI Diabetic control "pretty good"  HgA1C 6 something Chronic cough on several antibiotic course, was hospitalized at [facility] for 2 nights over the last weekend, evaluated by cardiology and was recommended to see an allergy specialist CXR and CT chest "didn't show anything" Hx of asthma on 2 inhalers  Still taking Opana ER 30 mg twice a day- no side effects except occ constipation, uses Miralax Gabapentin  862m  2 tablets TID  No falls Pain Inventory Average Pain 8 Pain Right Now 7 My pain is sharp, burning, stabbing, tingling and aching  In the last 24 hours, has pain interfered with the following? General activity 7 Relation with others 5 Enjoyment of life 8 What TIME of day is your pain at its worst? night Sleep (in general) Poor  Pain is worse with: walking, bending, standing and some activites Pain improves with: heat/ice and medication Relief from Meds: 4  Mobility walk without assistance use a cane use a walker how many minutes can you walk? 7 ability to climb steps?  yes do you drive?  no Do you have any goals in this area?  yes  Function disabled: date disabled . I need assistance with the following:  meal prep, household duties and shopping  Neuro/Psych bladder control problems weakness tingling dizziness depression anxiety  Prior Studies Any changes since last visit?  yes x-rays CT/MRI  Physicians involved in your care Any changes since last visit?  no   Family History  Problem Relation Age of Onset  . Cancer    . Coronary artery disease     History   Social History  . Marital Status: Legally Separated    Spouse Name: N/A    Number of Children: N/A  . Years of Education: N/A   Occupational History  . Disabled    Social History Main Topics  . Smoking status: Never Smoker   . Smokeless tobacco: Never Used  . Alcohol Use: No  .  Drug Use: No  . Sexual Activity: None   Other Topics Concern  . None   Social History Narrative   Patient does not get regular exercise   Past Surgical History  Procedure Laterality Date  . Coronary angioplasty with stent placement    . Colonoscopy  08/06/2011    Procedure: COLONOSCOPY;  Surgeon: NRogene Houston MD;  [facility];  Service: Endoscopy;  Laterality: N/A;  215  . Arm surgery Bilateral     due to fall-pt broke both forearms  . Colonoscopy with propofol N/A 12/31/2012    Procedure: COLONOSCOPY WITH PROPOFOL;  Surgeon: NRogene Houston MD;  Location: [facility];  Service: Endoscopy;  Laterality: N/A;  in cecum at 0814, total withdrawal time 156m  . Esophagogastroduodenoscopy (egd) with propofol N/A 12/31/2012    Procedure: ESOPHAGOGASTRODUODENOSCOPY (EGD) WITH PROPOFOL;  Surgeon: NaRogene HoustonMD;  Location: [facility];  Service: Endoscopy;  Laterality: N/A;  GE junction 37,  . Maloney dilation N/A 12/31/2012    Procedure: MAVenia MinksILATION;  Surgeon: NaRogene HoustonMD;  Location: [facility];  Service: Endoscopy;  Laterality: N/A;  used a # 54,#56  . Cholecystectomy    . Abdominal hysterectomy    . Esophagogastroduodenoscopy (egd) with propofol N/A 07/01/2013    Procedure: ESOPHAGOGASTRODUODENOSCOPY (EGD) WITH PROPOFOL;  Surgeon: NaRogene HoustonMD;  Location: [facility];  Service: Endoscopy;  Laterality: N/A;  950  . Maloney dilation N/A 07/01/2013    Procedure: Venia Minks DILATION;  Surgeon: Rogene Houston, MD;  Location: [facility];  Service: Endoscopy;  Laterality: N/A;  54/58; no heme present  . Esophageal biopsy N/A 07/01/2013    Procedure: BIOPSY;  Surgeon: Rogene Houston, MD;  Location: [facility];  Service: Endoscopy;  Laterality: N/A;   Past Medical History  Diagnosis Date  . Palpitations   . Tachycardia   . CAD (coronary artery disease)     Stent placement circumflex coronary 2007, catheterization 2008 patent stents. Normal LV function  . Chest pain   . Diabetes  mellitus     Insulin dependent  . Dyslipidemia   . Anxiety and depression   . Diabetic polyneuropathy      severe on multiple medications  . 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 MI (myocardial infarction)     2007  . Anxiety   . Depression   . Obstructive sleep apnea     CPAP machine at night  . COPD (chronic obstructive pulmonary disease)   . GERD (gastroesophageal reflux disease)   . CHF (congestive heart failure)   . Headache(784.0)   . Arthritis    BP 118/51 mmHg  Pulse 70  Resp 14  SpO2 94%  Opioid Risk Score:   Fall Risk Score:  (pt given pamphlet during todays visit) Review of Systems  Gastrointestinal: Positive for nausea, diarrhea and constipation.  Genitourinary: Positive for urgency and frequency.       Incontinence  Neurological: Positive for dizziness and weakness.       Tingling  Psychiatric/Behavioral: Positive for dysphoric mood. The patient is nervous/anxious.   All other systems reviewed and are negative.      Objective:   Physical Exam  Neurological:  Reflex Scores:      Tricep reflexes are 1+ on the right side and 0 on the left side.      Bicep reflexes are 0 on the right side and 0 on the left side.      Brachioradialis reflexes are 0 on the right side and 0 on the left side.      Patellar reflexes are 0 on the right side and 0 on the left side.      Achilles reflexes are 0 on the right side and 0 on the left side.   Decreased sensation to pinprick below the knees bilateral Decreased sensation to pinprick distal to wrist crease bilateral Absent sensation to proprioception bilateral feet Intact sensation to proprioception bilateral hands  Skin shows no evidence of breakdown in the feet or in the hand region.  Deep tendon reflexes are absent in bilateral Achilles and patellar      Assessment & Plan:  1. Painful diabetic neuropathy. Affecting both feet below the knees. No symptoms in the hands although there is some sign of neuropathy in the hands  with reduced pinprick sensation in the fingers. No evidence of skin breakdown. There is muscle weakness in the ankle and toe flexors and extensors bilaterally. Encouraged patient to do active range of motion of both lower extremities at the hips knees and ankles and toes Instructions for theraband exercises  Continue gabapentin 800 mg 3 times a day Continue Opana ER 30 mg twice a day  Continue opioid monitoring program. This consists of regular clinic visits, examinations, urine drug screen, pill counts as well as use of [place] controlled substance reporting System. 2. Chronic low back pain with lumbar spondylosis  but no significant spinal stenosis. Encouraged patient standing is much as possible so.  Return to clinic one month to see nurse practitioner M.D. Visit in 6 months

## 2014-03-03 NOTE — Patient Instructions (Signed)
RANGE OF MOTION (ROM) AND STRETCHING EXERCISES -  These exercises may help you when beginning to restore flexibility in your ankle. You will likely work on these exercises for the 1 to 2 weeks after your injury. Once your physician, physical therapist, or athletic trainer sees adequate progress, he or she will advance your exercises. While completing these exercises, remember:   Restoring tissue flexibility helps normal motion to return to the joints. This allows healthier, less painful movement and activity.  An effective stretch should be held for at least 30 seconds.  A stretch should never be painful. You should only feel a gentle lengthening or release in the stretched tissue. RANGE OF MOTION - Dorsi/Plantar Flexion  While sitting with your right / left knee straight, draw the top of your foot upwards by flexing your ankle. Then reverse the motion, pointing your toes downward.  Hold each position for __________ seconds.  After completing your first set of exercises, repeat this exercise with your knee bent. Repeat __________ times. Complete this exercise __________ times per day.  RANGE OF MOTION - Ankle Alphabet  Imagine your right / left big toe is a pen.  Keeping your hip and knee still, write out the entire alphabet with your "pen." Make the letters as large as you can without increasing any discomfort. Repeat __________ times. Complete this exercise __________ times per day.  STRENGTHENING EXERCISES - Ankle Sprain, Acute -Phase I, Weeks 1 to 2 These exercises may help you when beginning to restore strength in your ankle. You will likely work on these exercises for 1 to 2 weeks after your injury. Once your physician, physical therapist, or athletic trainer sees adequate progress, he or she will advance your exercises. While completing these exercises, remember:   Muscles can gain both the endurance and the strength needed for everyday activities through controlled  exercises.  Complete these exercises as instructed by your physician, physical therapist, or athletic trainer. Progress the resistance and repetitions only as guided.  You may experience muscle soreness or fatigue, but the pain or discomfort you are trying to eliminate should never worsen during these exercises. If this pain does worsen, stop and make certain you are following the directions exactly. If the pain is still present after adjustments, discontinue the exercise until you can discuss the trouble with your clinician. STRENGTH - Dorsiflexors  Secure a rubber exercise band/tubing to a fixed object (i.e., table, pole) and loop the other end around your right / left foot.  Sit on the floor facing the fixed object. The band/tubing should be slightly tense when your foot is relaxed.  Slowly draw your foot back toward you using your ankle and toes.  Hold this position for __________ seconds. Slowly release the tension in the band and return your foot to the starting position. Repeat __________ times. Complete this exercise __________ times per day.  STRENGTH - Plantar-flexors   Sit with your right / left leg extended. Holding onto both ends of a rubber exercise band/tubing, loop it around the ball of your foot. Keep a slight tension in the band.  Slowly push your toes away from you, pointing them downward.  Hold this position for __________ seconds. Return slowly, controlling the tension in the band/tubing. Repeat __________ times. Complete this exercise __________ times per day.  STRENGTH - Ankle Eversion  Secure one end of a rubber exercise band/tubing to a fixed object (table, pole). Loop the other end around your foot just before your toes.  Place your fists between your knees. This will focus your strengthening at your ankle.  Drawing the band/tubing across your opposite foot, slowly, pull your little toe out and up. Make sure the band/tubing is positioned to resist the entire  motion.  Hold this position for __________ seconds. Have your muscles resist the band/tubing as it slowly pulls your foot back to the starting position.  Repeat __________ times. Complete this exercise __________ times per day.  STRENGTH - Ankle Inversion  Secure one end of a rubber exercise band/tubing to a fixed object (table, pole). Loop the other end around your foot just before your toes.  Place your fists between your knees. This will focus your strengthening at your ankle.  Slowly, pull your big toe up and in, making sure the band/tubing is positioned to resist the entire motion.  Hold this position for __________ seconds.  Have your muscles resist the band/tubing as it slowly pulls your foot back to the starting position. Repeat __________ times. Complete this exercises __________ times per day.  STRENGTH - Towel Curls  Sit in a chair positioned on a non-carpeted surface.  Place your right / left foot on a towel, keeping your heel on the floor.  Pull the towel toward your heel by only curling your toes. Keep your heel on the floor.  If instructed by your physician, physical therapist, or athletic trainer, add weight to the end of the towel. Repeat __________ times. Complete this exercise __________ times per day. Document Released: 10/02/2004 Document Revised: 07/18/2013 Document Reviewed: 06/15/2008 [facility] Patient Information 2015 [place], [place]. This information is not intended to replace advice given to you by your health care provider. Make sure you discuss any questions you have with your health care provider.

## 2014-03-16 ENCOUNTER — Telehealth (INDEPENDENT_AMBULATORY_CARE_PROVIDER_SITE_OTHER): Payer: Self-pay | Admitting: Internal Medicine

## 2014-03-16 MED ORDER — LOPERAMIDE HCL 2 MG PO CAPS: 2.0000 mg | ORAL_CAPSULE | Freq: Two times a day (BID) | ORAL | Status: DC

## 2014-03-16 NOTE — Telephone Encounter (Signed)
Rx for Loperamide sent to pharmacy

## 2014-03-22 NOTE — Progress Notes (Signed)
Urine drug screen for this encounter is consistent for prescribed medications.   

## 2014-03-27 ENCOUNTER — Telehealth: Payer: Self-pay | Admitting: *Deleted

## 2014-03-27 NOTE — Telephone Encounter (Signed)
Pt called very confused about her appt times, pt was convinced her appt was set for 01/18 and was wondering why her appt was changed 01/14, pt relies on vouchers for transportation and had arranged for transportation on 1/18 and not 1/14. I called the patient back and explained her appt situation, Danella Sensing on 01/14 and Hildred Laser on 01/18. Pt was again confused and convinced she was coming here on 1/18 and not to see Dr. Laural Golden. We talked and she decided she would attempt to call transportation and try to get this situation worked out, if not she will call back to make a new appt

## 2014-03-30 ENCOUNTER — Ambulatory Visit: Payer: Medicaid Other | Admitting: Registered Nurse

## 2014-04-03 ENCOUNTER — Encounter (INDEPENDENT_AMBULATORY_CARE_PROVIDER_SITE_OTHER): Payer: Self-pay | Admitting: Internal Medicine

## 2014-04-03 ENCOUNTER — Encounter: Payer: Self-pay | Admitting: Registered Nurse

## 2014-04-03 ENCOUNTER — Encounter: Payer: Medicaid Other | Attending: Physical Medicine & Rehabilitation | Admitting: Registered Nurse

## 2014-04-03 ENCOUNTER — Other Ambulatory Visit: Payer: Self-pay | Admitting: Physical Medicine & Rehabilitation

## 2014-04-03 ENCOUNTER — Ambulatory Visit (INDEPENDENT_AMBULATORY_CARE_PROVIDER_SITE_OTHER): Payer: Medicaid Other | Admitting: Internal Medicine

## 2014-04-03 VITALS — BP 128/68 | HR 78 | Temp 98.4°F | Resp 18 | Ht 66.0 in | Wt 246.7 lb

## 2014-04-03 VITALS — BP 122/58 | HR 60 | Resp 14

## 2014-04-03 DIAGNOSIS — M25562 Pain in left knee: Secondary | ICD-10-CM

## 2014-04-03 DIAGNOSIS — E785 Hyperlipidemia, unspecified: Secondary | ICD-10-CM | POA: Diagnosis not present

## 2014-04-03 DIAGNOSIS — G4733 Obstructive sleep apnea (adult) (pediatric): Secondary | ICD-10-CM | POA: Insufficient documentation

## 2014-04-03 DIAGNOSIS — F418 Other specified anxiety disorders: Secondary | ICD-10-CM | POA: Insufficient documentation

## 2014-04-03 DIAGNOSIS — G8929 Other chronic pain: Secondary | ICD-10-CM

## 2014-04-03 DIAGNOSIS — R51 Headache: Secondary | ICD-10-CM | POA: Insufficient documentation

## 2014-04-03 DIAGNOSIS — M1990 Unspecified osteoarthritis, unspecified site: Secondary | ICD-10-CM | POA: Diagnosis not present

## 2014-04-03 DIAGNOSIS — R002 Palpitations: Secondary | ICD-10-CM | POA: Diagnosis not present

## 2014-04-03 DIAGNOSIS — M79604 Pain in right leg: Secondary | ICD-10-CM | POA: Diagnosis not present

## 2014-04-03 DIAGNOSIS — M79605 Pain in left leg: Secondary | ICD-10-CM | POA: Insufficient documentation

## 2014-04-03 DIAGNOSIS — M47816 Spondylosis without myelopathy or radiculopathy, lumbar region: Secondary | ICD-10-CM

## 2014-04-03 DIAGNOSIS — I252 Old myocardial infarction: Secondary | ICD-10-CM | POA: Diagnosis not present

## 2014-04-03 DIAGNOSIS — I2510 Atherosclerotic heart disease of native coronary artery without angina pectoris: Secondary | ICD-10-CM | POA: Diagnosis not present

## 2014-04-03 DIAGNOSIS — J449 Chronic obstructive pulmonary disease, unspecified: Secondary | ICD-10-CM | POA: Insufficient documentation

## 2014-04-03 DIAGNOSIS — M5489 Other dorsalgia: Secondary | ICD-10-CM | POA: Insufficient documentation

## 2014-04-03 DIAGNOSIS — Z79899 Other long term (current) drug therapy: Secondary | ICD-10-CM

## 2014-04-03 DIAGNOSIS — Z1401 Asymptomatic hemophilia A carrier: Secondary | ICD-10-CM | POA: Diagnosis not present

## 2014-04-03 DIAGNOSIS — Z5181 Encounter for therapeutic drug level monitoring: Secondary | ICD-10-CM

## 2014-04-03 DIAGNOSIS — R000 Tachycardia, unspecified: Secondary | ICD-10-CM | POA: Diagnosis not present

## 2014-04-03 DIAGNOSIS — E1142 Type 2 diabetes mellitus with diabetic polyneuropathy: Secondary | ICD-10-CM

## 2014-04-03 DIAGNOSIS — Z760 Encounter for issue of repeat prescription: Secondary | ICD-10-CM | POA: Insufficient documentation

## 2014-04-03 DIAGNOSIS — I509 Heart failure, unspecified: Secondary | ICD-10-CM | POA: Diagnosis not present

## 2014-04-03 DIAGNOSIS — M47896 Other spondylosis, lumbar region: Secondary | ICD-10-CM | POA: Diagnosis not present

## 2014-04-03 DIAGNOSIS — R079 Chest pain, unspecified: Secondary | ICD-10-CM | POA: Insufficient documentation

## 2014-04-03 DIAGNOSIS — K219 Gastro-esophageal reflux disease without esophagitis: Secondary | ICD-10-CM | POA: Insufficient documentation

## 2014-04-03 DIAGNOSIS — M25561 Pain in right knee: Secondary | ICD-10-CM

## 2014-04-03 DIAGNOSIS — Z794 Long term (current) use of insulin: Secondary | ICD-10-CM | POA: Insufficient documentation

## 2014-04-03 DIAGNOSIS — G894 Chronic pain syndrome: Secondary | ICD-10-CM

## 2014-04-03 DIAGNOSIS — G4700 Insomnia, unspecified: Secondary | ICD-10-CM | POA: Diagnosis not present

## 2014-04-03 DIAGNOSIS — K900 Celiac disease: Secondary | ICD-10-CM

## 2014-04-03 MED ORDER — OXYMORPHONE HCL ER 30 MG PO TB12: 30.0000 mg | ORAL_TABLET | Freq: Two times a day (BID) | ORAL | Status: DC

## 2014-04-03 MED ORDER — PANTOPRAZOLE SODIUM 40 MG PO TBEC: 40.0000 mg | DELAYED_RELEASE_TABLET | Freq: Two times a day (BID) | ORAL | Status: DC

## 2014-04-03 NOTE — Patient Instructions (Addendum)
Take pantoprazole 40 mg by mouth 30 minutes before breakfast and evening meal daily to 12 weeks. Please call office with progress report in 10 weeks. Check with Dr. Woody Seller about referral to ENT specialist. Please note Imodium and dicyclomine removed from the list of your medications since you're not using these medications

## 2014-04-03 NOTE — Progress Notes (Signed)
Subjective:    Patient ID: [name], female    DOB: 1950-06-04, 64 y.o.   MRN: [id number]  HPI: [name] is a 64 year old female who returns for follow up for chronic pain and medication refill. She says her pain is located in her mid- back radiating into his right lower extremity laterally. She rates her pain 7. Her current exercise regime is performing chair exercises 3-4 times a day and walking daily to mailbox.  Pain Inventory Average Pain 8 Pain Right Now 7 My pain is constant, sharp, burning and stabbing  In the last 24 hours, has pain interfered with the following? General activity 7 Relation with others 5 Enjoyment of life 8 What TIME of day is your pain at its worst? night Sleep (in general) Poor  Pain is worse with: walking, bending, standing and some activites Pain improves with: heat/ice and medication Relief from Meds: 4  Mobility use a cane how many minutes can you walk? 10 ability to climb steps?  yes do you drive?  yes  Function disabled: date disabled .  Neuro/Psych weakness tingling depression anxiety  Prior Studies Any changes since last visit?  no  Physicians involved in your care Any changes since last visit?  no   Family History  Problem Relation Age of Onset  . Cancer    . Coronary artery disease     History   Social History  . Marital Status: Legally Separated    Spouse Name: N/A    Number of Children: N/A  . Years of Education: N/A   Occupational History  . Disabled    Social History Main Topics  . Smoking status: Never Smoker   . Smokeless tobacco: Never Used  . Alcohol Use: No  . Drug Use: No  . Sexual Activity: None   Other Topics Concern  . None   Social History Narrative   Patient does not get regular exercise   Past Surgical History  Procedure Laterality Date  . Coronary angioplasty with stent placement    . Colonoscopy  08/06/2011    Procedure: COLONOSCOPY;  Surgeon: Rogene Houston, MD;   [facility];  Service: Endoscopy;  Laterality: N/A;  215  . Arm surgery Bilateral     due to fall-pt broke both forearms  . Colonoscopy with propofol N/A 12/31/2012    Procedure: COLONOSCOPY WITH PROPOFOL;  Surgeon: Rogene Houston, MD;  Location: [facility];  Service: Endoscopy;  Laterality: N/A;  in cecum at 0814, total withdrawal time 32min  . Esophagogastroduodenoscopy (egd) with propofol N/A 12/31/2012    Procedure: ESOPHAGOGASTRODUODENOSCOPY (EGD) WITH PROPOFOL;  Surgeon: Rogene Houston, MD;  Location: [facility];  Service: Endoscopy;  Laterality: N/A;  GE junction 37,  . Maloney dilation N/A 12/31/2012    Procedure: Venia Minks DILATION;  Surgeon: Rogene Houston, MD;  Location: [facility];  Service: Endoscopy;  Laterality: N/A;  used a # 54,#56  . Cholecystectomy    . Abdominal hysterectomy    . Esophagogastroduodenoscopy (egd) with propofol N/A 07/01/2013    Procedure: ESOPHAGOGASTRODUODENOSCOPY (EGD) WITH PROPOFOL;  Surgeon: Rogene Houston, MD;  Location: [facility];  Service: Endoscopy;  Laterality: N/A;  950  . Maloney dilation N/A 07/01/2013    Procedure: Venia Minks DILATION;  Surgeon: Rogene Houston, MD;  Location: [facility];  Service: Endoscopy;  Laterality: N/A;  54/58; no heme present  . Esophageal biopsy N/A 07/01/2013    Procedure: BIOPSY;  Surgeon: Mechele Dawley  Laural Golden, MD;  Location: [facility];  Service: Endoscopy;  Laterality: N/A;   Past Medical History  Diagnosis Date  . Palpitations   . Tachycardia   . CAD (coronary artery disease)     Stent placement circumflex coronary 2007, catheterization 2008 patent stents. Normal LV function  . Chest pain   . Diabetes mellitus     Insulin dependent  . Dyslipidemia   . Anxiety and depression   . Diabetic polyneuropathy      severe on multiple medications  . 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 MI (myocardial infarction)     2007  . Anxiety   . Depression   . Obstructive sleep apnea     CPAP machine at night  . COPD (chronic obstructive pulmonary  disease)   . GERD (gastroesophageal reflux disease)   . CHF (congestive heart failure)   . Headache(784.0)   . Arthritis    BP 122/58 mmHg  Pulse 60  Resp 14  SpO2 94%  Opioid Risk Score:   Fall Risk Score: Low Fall Risk (0-5 points)  Review of Systems  HENT: Negative.   Eyes: Negative.   Respiratory: Negative.   Cardiovascular: Negative.   Gastrointestinal: Positive for nausea, vomiting, abdominal pain and diarrhea.  Endocrine: Negative.   Genitourinary: Negative.   Musculoskeletal: Positive for myalgias, back pain, arthralgias and gait problem.  Skin: Negative.   Allergic/Immunologic: Negative.   Neurological: Positive for weakness.       Tingling  Hematological: Negative.   Psychiatric/Behavioral: Positive for dysphoric mood. The patient is nervous/anxious.        Objective:   Physical Exam  Constitutional: She is oriented to person, place, and time. She appears well-developed and well-nourished.  HENT:  Head: Normocephalic and atraumatic.  Neck: Normal range of motion. Neck supple.  Cardiovascular: Normal rate and regular rhythm.   Pulmonary/Chest: Effort normal and breath sounds normal.  Musculoskeletal:  Normal Muscle Bulk and Muscle Testing Reveals: Upper Extremities: Full ROM and Muscle strength 5/5 Thoracic Paraspinal Tenderness: T-4- T-7 Lumbar Paraspinal Tenderness: L-3- L-5 Lower Extremities: Full ROM and Muscle Strength 5/5 Bilateral Lower extremities Flexion Produces Pain into Patella Arises from chair with ease Narrow Based Gait  Neurological: She is alert and oriented to person, place, and time.  Skin: Skin is warm and dry.  Psychiatric: She has a normal mood and affect.  Nursing note and vitals reviewed.         Assessment & Plan:   1.Lumbar pain lumbar spondylosis:  Refilled: Opana 30 mg one tablet every 12 hours as needed #60  2. Severe diabetic poly neuropathy: Continue to Monitor 3. Insomnia: On Trazodone PCP Following  20  minutes of face to face patient care time was spent during this visit. All questions were encouraged and answered.  F/U in 1 Month.

## 2014-04-03 NOTE — Progress Notes (Signed)
Presenting complaint;  Follow-up for celiac disease and GERD.  Subjective:  Patient is 64 year old Caucasian female who is here for scheduled visit. She was last seen 6 months ago. She was evaluated for chronic diarrhea and diagnosed with celiac disease in April 2015 and has been on gluten-free diet. She is not having diarrhea and/or abdominal cramps. She has not taken Imodium or dicyclomine and several weeks. Today she is complaining of hoarseness recurrent cough. She has heartburn no more than once or twice a week easily responding to Tums. She takes pantoprazole every morning. About 2 months ago she was admitted to [facility] with intractable cough and vomiting. She says she's been treated with antibiotics and also for bronchial last number her hoarseness and cough is not getting better. She remains very concerned about these symptoms since her boyfriend's ex died of throat cancer. She has gained 7 pounds since her last visit which she states is due to fluid retention. Since she has been on gluten-free diet she is been having 2 formed stools daily and she has not been constipated.    Current Medications: Outpatient Encounter Prescriptions as of 04/03/2014  Medication Sig  . ALPRAZolam (XANAX) 1 MG tablet Take 1 mg by mouth 4 (four) times daily.    Marland Kitchen aspirin EC 81 MG tablet Take 81 mg by mouth daily.  Marland Kitchen atorvastatin (LIPITOR) 40 MG tablet Take 40 mg by mouth daily.  . beclomethasone (QVAR) 40 MCG/ACT inhaler Inhale 1 puff into the lungs 2 (two) times daily.  Marland Kitchen dicyclomine (BENTYL) 10 MG capsule TAKE 1 CAPSULE BY MOUTH TWICE DAILY AS NEEDED FOR SPASMS.  Marland Kitchen estrogens, conjugated, (PREMARIN) 1.25 MG tablet Take 1.25 mg by mouth daily.   . fluticasone-salmeterol (ADVAIR HFA) 115-21 MCG/ACT inhaler Inhale 2 puffs into the lungs 2 (two) times daily.  Marland Kitchen ibuprofen (ADVIL,MOTRIN) 200 MG tablet Take 200 mg by mouth daily as needed for headache.  . insulin glargine (LANTUS) 100 UNIT/ML injection Inject 30-60  Units into the skin 2 (two) times daily.   . insulin lispro (HUMALOG) 100 UNIT/ML injection Inject 2-10 Units into the skin 2 (two) times daily before a meal. Per patient this is now uses as a sliding scale.  . lidocaine (XYLOCAINE) 5 % ointment Apply 1 application topically at bedtime.   Marland Kitchen lisinopril (PRINIVIL,ZESTRIL) 10 MG tablet Take by mouth daily.  Marland Kitchen loperamide (HM LOPERAMIDE HCL) 2 MG capsule Take 1 capsule (2 mg total) by mouth 2 (two) times daily before a meal.  . methocarbamol (ROBAXIN) 500 MG tablet Take 1 tablet (500 mg total) by mouth 3 (three) times daily.  . nitroGLYCERIN (NITROSTAT) 0.4 MG SL tablet Place 1 tablet (0.4 mg total) under the tongue every 5 (five) minutes x 3 doses as needed. For chest pains  . ondansetron (ZOFRAN) 4 MG tablet Take 4 mg by mouth every 6 (six) hours as needed for nausea or vomiting.  Marland Kitchen oxymorphone (OPANA ER) 30 MG 12 hr tablet Take 1 tablet (30 mg total) by mouth every 12 (twelve) hours.  . pantoprazole (PROTONIX) 40 MG tablet Take 1 tablet (40 mg total) by mouth daily.  . potassium chloride (K-DUR,KLOR-CON) 10 MEQ tablet TAKE 2 TABLETS BY MOUTH 2 TIMES DAILY.  Marland Kitchen psyllium (METAMUCIL SMOOTH TEXTURE) 28 % packet Take 1 packet by mouth at bedtime.  . TOPROL XL 50 MG 24 hr tablet TAKE 1 TABLET BY MOUTH DAILY WITH OR IMMEDIATELY FOLLOWING A MEAL.  . traZODone (DESYREL) 25 mg TABS tablet Take 0.5 tablets (25  mg total) by mouth at bedtime. (Patient taking differently: Take 50 mg by mouth at bedtime. )     Objective: Blood pressure 128/68, pulse 78, temperature 98.4 F (36.9 C), temperature source Oral, resp. rate 18, height 5' 6"  (1.676 m), weight 246 lb 11.2 oz (111.902 kg). Patient is alert and in no acute distress. She is quite anxious. Conjunctiva is pink. Sclera is nonicteric Oropharyngeal mucosa is normal. No neck masses or thyromegaly noted. Cardiac exam with regular rhythm normal S1 and S2. No murmur or gallop noted. Lungs are clear to  auscultation. Abdomen is full. It is soft with mild tenderness below the right margin. Liver edge is indistinct. No masses noted. 1+ pitting edema in noted to both legs.    Assessment:  #1. Celiac disease. She is doing much better with gluten-free diet. She is not taking Imodium and or dicyclomine. Therefore these medications could be discontinued. #2. Chronic GERD. Heartburn is reasonably well controlled but she is having hoarseness and cough which started a few months ago. The symptoms may well be secondary to gastroesophageal reflux disease but other etiologies need to be ruled out.   Plan:  Increase pantoprazole to 40 mg by mouth twice a day for 12 weeks and then she'll drop the dose back to once a day. Patient advised to call office with progress report in 10 weeks. Discontinue Imodium and dicyclomine. Check with Dr. Woody Seller about referral to ENT specialist. Office visit in one year.

## 2014-04-10 ENCOUNTER — Other Ambulatory Visit: Payer: Self-pay | Admitting: Physical Medicine & Rehabilitation

## 2014-04-10 ENCOUNTER — Other Ambulatory Visit: Payer: Self-pay | Admitting: *Deleted

## 2014-04-10 MED ORDER — METHOCARBAMOL 500 MG PO TABS
500.0000 mg | ORAL_TABLET | Freq: Three times a day (TID) | ORAL | Status: DC
Start: 2014-04-10 — End: 2014-04-10

## 2014-04-10 NOTE — Telephone Encounter (Signed)
recvd fax request for refill - Methocarbam 500 mg #90  RF #3. Sent in electronically

## 2014-04-13 NOTE — Progress Notes (Signed)
Urine drug screen for this encounter is inconsistent.  Evidence of taking hydrocodone. Prescribed medications present.

## 2014-05-01 ENCOUNTER — Encounter: Payer: Medicaid Other | Admitting: Registered Nurse

## 2014-05-01 ENCOUNTER — Ambulatory Visit: Payer: Medicaid Other | Admitting: Registered Nurse

## 2014-05-03 ENCOUNTER — Encounter: Payer: Self-pay | Admitting: Registered Nurse

## 2014-05-03 ENCOUNTER — Encounter: Payer: Medicaid Other | Attending: Physical Medicine & Rehabilitation | Admitting: Registered Nurse

## 2014-05-03 VITALS — BP 125/55 | HR 65 | Resp 14

## 2014-05-03 DIAGNOSIS — I509 Heart failure, unspecified: Secondary | ICD-10-CM | POA: Insufficient documentation

## 2014-05-03 DIAGNOSIS — F418 Other specified anxiety disorders: Secondary | ICD-10-CM | POA: Diagnosis not present

## 2014-05-03 DIAGNOSIS — E785 Hyperlipidemia, unspecified: Secondary | ICD-10-CM | POA: Insufficient documentation

## 2014-05-03 DIAGNOSIS — M47896 Other spondylosis, lumbar region: Secondary | ICD-10-CM | POA: Insufficient documentation

## 2014-05-03 DIAGNOSIS — Z5181 Encounter for therapeutic drug level monitoring: Secondary | ICD-10-CM

## 2014-05-03 DIAGNOSIS — G8929 Other chronic pain: Secondary | ICD-10-CM | POA: Insufficient documentation

## 2014-05-03 DIAGNOSIS — I2510 Atherosclerotic heart disease of native coronary artery without angina pectoris: Secondary | ICD-10-CM | POA: Diagnosis not present

## 2014-05-03 DIAGNOSIS — M79604 Pain in right leg: Secondary | ICD-10-CM | POA: Diagnosis not present

## 2014-05-03 DIAGNOSIS — R002 Palpitations: Secondary | ICD-10-CM | POA: Insufficient documentation

## 2014-05-03 DIAGNOSIS — M5489 Other dorsalgia: Secondary | ICD-10-CM | POA: Diagnosis not present

## 2014-05-03 DIAGNOSIS — R079 Chest pain, unspecified: Secondary | ICD-10-CM | POA: Insufficient documentation

## 2014-05-03 DIAGNOSIS — M1990 Unspecified osteoarthritis, unspecified site: Secondary | ICD-10-CM | POA: Diagnosis not present

## 2014-05-03 DIAGNOSIS — Z794 Long term (current) use of insulin: Secondary | ICD-10-CM | POA: Diagnosis not present

## 2014-05-03 DIAGNOSIS — M79605 Pain in left leg: Secondary | ICD-10-CM | POA: Insufficient documentation

## 2014-05-03 DIAGNOSIS — J449 Chronic obstructive pulmonary disease, unspecified: Secondary | ICD-10-CM | POA: Insufficient documentation

## 2014-05-03 DIAGNOSIS — M25561 Pain in right knee: Secondary | ICD-10-CM

## 2014-05-03 DIAGNOSIS — E1142 Type 2 diabetes mellitus with diabetic polyneuropathy: Secondary | ICD-10-CM | POA: Diagnosis not present

## 2014-05-03 DIAGNOSIS — G4700 Insomnia, unspecified: Secondary | ICD-10-CM | POA: Insufficient documentation

## 2014-05-03 DIAGNOSIS — Z1401 Asymptomatic hemophilia A carrier: Secondary | ICD-10-CM | POA: Insufficient documentation

## 2014-05-03 DIAGNOSIS — R51 Headache: Secondary | ICD-10-CM | POA: Insufficient documentation

## 2014-05-03 DIAGNOSIS — I252 Old myocardial infarction: Secondary | ICD-10-CM | POA: Insufficient documentation

## 2014-05-03 DIAGNOSIS — K219 Gastro-esophageal reflux disease without esophagitis: Secondary | ICD-10-CM | POA: Diagnosis not present

## 2014-05-03 DIAGNOSIS — G4733 Obstructive sleep apnea (adult) (pediatric): Secondary | ICD-10-CM | POA: Insufficient documentation

## 2014-05-03 DIAGNOSIS — Z760 Encounter for issue of repeat prescription: Secondary | ICD-10-CM | POA: Diagnosis not present

## 2014-05-03 DIAGNOSIS — M25562 Pain in left knee: Secondary | ICD-10-CM

## 2014-05-03 DIAGNOSIS — Z79899 Other long term (current) drug therapy: Secondary | ICD-10-CM

## 2014-05-03 DIAGNOSIS — R000 Tachycardia, unspecified: Secondary | ICD-10-CM | POA: Insufficient documentation

## 2014-05-03 DIAGNOSIS — G894 Chronic pain syndrome: Secondary | ICD-10-CM

## 2014-05-03 DIAGNOSIS — M47816 Spondylosis without myelopathy or radiculopathy, lumbar region: Secondary | ICD-10-CM

## 2014-05-03 MED ORDER — OXYMORPHONE HCL ER 30 MG PO TB12: 30.0000 mg | ORAL_TABLET | Freq: Two times a day (BID) | ORAL | Status: DC

## 2014-05-03 NOTE — Progress Notes (Signed)
Subjective:    Patient ID: [name], female    DOB: 1950-05-27, 64 y.o.   MRN: [id number]  HPI: [name] is a 64 year old female who returns for follow up for chronic pain and medication refill. She says her pain is located in her bilateral hands,feet (neuropathy),and lower back radiating into her lower extremities posteriorly. She rates her pain 7. Her current exercise regime is performing chair exercises 3-4 times a day and walking daily to mailbox.  Pain Inventory Average Pain 8 Pain Right Now 7 My pain is sharp, burning, stabbing, tingling and aching  In the last 24 hours, has pain interfered with the following? General activity 7 Relation with others 5 Enjoyment of life 8 What TIME of day is your pain at its worst? night Sleep (in general) Poor  Pain is worse with: walking, bending, standing, some activites and other Pain improves with: heat/ice and medication Relief from Meds: 8  Mobility walk with assistance use a cane how many minutes can you walk? 10 ability to climb steps?  yes do you drive?  yes  Function disabled: date disabled . I need assistance with the following:  meal prep, household duties and shopping  Neuro/Psych weakness tingling dizziness depression anxiety  Prior Studies Any changes since last visit?  no  Physicians involved in your care Any changes since last visit?  no   Family History  Problem Relation Age of Onset  . Cancer    . Coronary artery disease     History   Social History  . Marital Status: Legally Separated    Spouse Name: N/A  . Number of Children: N/A  . Years of Education: N/A   Occupational History  . Disabled    Social History Main Topics  . Smoking status: Never Smoker   . Smokeless tobacco: Never Used  . Alcohol Use: No  . Drug Use: No  . Sexual Activity: Not on file   Other Topics Concern  . None   Social History Narrative   Patient does not get regular exercise   Past  Surgical History  Procedure Laterality Date  . Coronary angioplasty with stent placement    . Colonoscopy  08/06/2011    Procedure: COLONOSCOPY;  Surgeon: Rogene Houston, MD;  [facility];  Service: Endoscopy;  Laterality: N/A;  215  . Arm surgery Bilateral     due to fall-pt broke both forearms  . Colonoscopy with propofol N/A 12/31/2012    Procedure: COLONOSCOPY WITH PROPOFOL;  Surgeon: Rogene Houston, MD;  Location: [facility];  Service: Endoscopy;  Laterality: N/A;  in cecum at 0814, total withdrawal time 45mn  . Esophagogastroduodenoscopy (egd) with propofol N/A 12/31/2012    Procedure: ESOPHAGOGASTRODUODENOSCOPY (EGD) WITH PROPOFOL;  Surgeon: NRogene Houston MD;  Location: [facility];  Service: Endoscopy;  Laterality: N/A;  GE junction 37,  . Maloney dilation N/A 12/31/2012    Procedure: MVenia MinksDILATION;  Surgeon: NRogene Houston MD;  Location: [facility];  Service: Endoscopy;  Laterality: N/A;  used a # 54,#56  . Cholecystectomy    . Abdominal hysterectomy    . Esophagogastroduodenoscopy (egd) with propofol N/A 07/01/2013    Procedure: ESOPHAGOGASTRODUODENOSCOPY (EGD) WITH PROPOFOL;  Surgeon: NRogene Houston MD;  Location: [facility];  Service: Endoscopy;  Laterality: N/A;  950  . Maloney dilation N/A 07/01/2013    Procedure: MVenia MinksDILATION;  Surgeon: NRogene Houston MD;  Location: [facility];  Service: Endoscopy;  Laterality: N/A;  54/58; no heme present  . Esophageal biopsy N/A 07/01/2013    Procedure: BIOPSY;  Surgeon: Rogene Houston, MD;  Location: [facility];  Service: Endoscopy;  Laterality: N/A;   Past Medical History  Diagnosis Date  . Palpitations   . Tachycardia   . CAD (coronary artery disease)     Stent placement circumflex coronary 2007, catheterization 2008 patent stents. Normal LV function  . Chest pain   . Diabetes mellitus     Insulin dependent  . Dyslipidemia   . Anxiety and depression   . Diabetic polyneuropathy      severe on multiple medications  . Hemophilia  A carrier   . MI (myocardial infarction)     2007  . Anxiety   . Depression   . Obstructive sleep apnea     CPAP machine at night  . COPD (chronic obstructive pulmonary disease)   . GERD (gastroesophageal reflux disease)   . CHF (congestive heart failure)   . Headache(784.0)   . Arthritis    BP 125/55 mmHg  Pulse 65  Resp 14  SpO2 95%  Opioid Risk Score:   Fall Risk Score: Moderate Fall Risk (6-13 points) (patient previously educated)  Review of Systems  Gastrointestinal: Positive for nausea, vomiting and constipation.  Neurological: Positive for dizziness and weakness.       Tingling  Psychiatric/Behavioral: Positive for dysphoric mood. The patient is nervous/anxious.   All other systems reviewed and are negative.      Objective:   Physical Exam  Constitutional: She is oriented to person, place, and time. She appears well-developed and well-nourished.  HENT:  Head: Normocephalic and atraumatic.  Neck: Normal range of motion. Neck supple.  Cardiovascular: Normal rate and regular rhythm.   Pulmonary/Chest: Effort normal and breath sounds normal.  Musculoskeletal:  Normal Muscle Bulk and Muscle Testing Reveals: Upper Extremities: Full ROM and Muscle Strength 5/5 Thoracic Paraspinal Tenderness: T-6- T-8 Lumbar Paraspinal Tenderness: L-3- L-5 Lower Extremities: Full ROM and Muscle strength 5/5 Left Lower Extremity Flexion Produces pain into Lumbar Right Lower Extremity Flexion Produces pain into Popliteal Fossa Arises from chair with ease Narrow Based Gait  Neurological: She is alert and oriented to person, place, and time.  Skin: Skin is warm and dry.  Psychiatric: She has a normal mood and affect.  Nursing note and vitals reviewed.         Assessment & Plan:  1.Lumbar pain lumbar spondylosis:  Refilled: Opana 30 mg one tablet every 12 hours as needed #60  2. Severe diabetic poly neuropathy: Continue to Monitor 3. Insomnia: On Trazodone PCP Following   20 minutes of face to face patient care time was spent during this visit. All questions were encouraged and answered.  F/U in 1 Month.

## 2014-05-05 ENCOUNTER — Ambulatory Visit: Payer: Medicaid Other | Admitting: Physical Medicine & Rehabilitation

## 2014-05-29 ENCOUNTER — Encounter: Payer: Self-pay | Admitting: Registered Nurse

## 2014-05-29 ENCOUNTER — Encounter: Payer: Medicaid Other | Attending: Physical Medicine & Rehabilitation | Admitting: Registered Nurse

## 2014-05-29 VITALS — BP 118/61 | HR 57 | Resp 14

## 2014-05-29 DIAGNOSIS — G894 Chronic pain syndrome: Secondary | ICD-10-CM

## 2014-05-29 DIAGNOSIS — M79604 Pain in right leg: Secondary | ICD-10-CM | POA: Diagnosis not present

## 2014-05-29 DIAGNOSIS — M47816 Spondylosis without myelopathy or radiculopathy, lumbar region: Secondary | ICD-10-CM

## 2014-05-29 DIAGNOSIS — R079 Chest pain, unspecified: Secondary | ICD-10-CM | POA: Insufficient documentation

## 2014-05-29 DIAGNOSIS — Z794 Long term (current) use of insulin: Secondary | ICD-10-CM | POA: Diagnosis not present

## 2014-05-29 DIAGNOSIS — R000 Tachycardia, unspecified: Secondary | ICD-10-CM | POA: Diagnosis not present

## 2014-05-29 DIAGNOSIS — R51 Headache: Secondary | ICD-10-CM | POA: Diagnosis not present

## 2014-05-29 DIAGNOSIS — M47896 Other spondylosis, lumbar region: Secondary | ICD-10-CM | POA: Insufficient documentation

## 2014-05-29 DIAGNOSIS — E1142 Type 2 diabetes mellitus with diabetic polyneuropathy: Secondary | ICD-10-CM

## 2014-05-29 DIAGNOSIS — F418 Other specified anxiety disorders: Secondary | ICD-10-CM | POA: Diagnosis not present

## 2014-05-29 DIAGNOSIS — Z1401 Asymptomatic hemophilia A carrier: Secondary | ICD-10-CM | POA: Diagnosis not present

## 2014-05-29 DIAGNOSIS — I2510 Atherosclerotic heart disease of native coronary artery without angina pectoris: Secondary | ICD-10-CM | POA: Insufficient documentation

## 2014-05-29 DIAGNOSIS — M5489 Other dorsalgia: Secondary | ICD-10-CM | POA: Insufficient documentation

## 2014-05-29 DIAGNOSIS — I252 Old myocardial infarction: Secondary | ICD-10-CM | POA: Insufficient documentation

## 2014-05-29 DIAGNOSIS — E785 Hyperlipidemia, unspecified: Secondary | ICD-10-CM | POA: Diagnosis not present

## 2014-05-29 DIAGNOSIS — M79605 Pain in left leg: Secondary | ICD-10-CM | POA: Insufficient documentation

## 2014-05-29 DIAGNOSIS — Z760 Encounter for issue of repeat prescription: Secondary | ICD-10-CM | POA: Diagnosis present

## 2014-05-29 DIAGNOSIS — M25562 Pain in left knee: Secondary | ICD-10-CM

## 2014-05-29 DIAGNOSIS — R002 Palpitations: Secondary | ICD-10-CM | POA: Insufficient documentation

## 2014-05-29 DIAGNOSIS — G4700 Insomnia, unspecified: Secondary | ICD-10-CM | POA: Diagnosis not present

## 2014-05-29 DIAGNOSIS — M25561 Pain in right knee: Secondary | ICD-10-CM

## 2014-05-29 DIAGNOSIS — G4733 Obstructive sleep apnea (adult) (pediatric): Secondary | ICD-10-CM | POA: Diagnosis not present

## 2014-05-29 DIAGNOSIS — Z5181 Encounter for therapeutic drug level monitoring: Secondary | ICD-10-CM

## 2014-05-29 DIAGNOSIS — I509 Heart failure, unspecified: Secondary | ICD-10-CM | POA: Insufficient documentation

## 2014-05-29 DIAGNOSIS — J449 Chronic obstructive pulmonary disease, unspecified: Secondary | ICD-10-CM | POA: Diagnosis not present

## 2014-05-29 DIAGNOSIS — G8929 Other chronic pain: Secondary | ICD-10-CM | POA: Diagnosis not present

## 2014-05-29 DIAGNOSIS — Z79899 Other long term (current) drug therapy: Secondary | ICD-10-CM

## 2014-05-29 DIAGNOSIS — K219 Gastro-esophageal reflux disease without esophagitis: Secondary | ICD-10-CM | POA: Diagnosis not present

## 2014-05-29 DIAGNOSIS — M1990 Unspecified osteoarthritis, unspecified site: Secondary | ICD-10-CM | POA: Insufficient documentation

## 2014-05-29 MED ORDER — OXYMORPHONE HCL ER 30 MG PO TB12: 30.0000 mg | ORAL_TABLET | Freq: Two times a day (BID) | ORAL | Status: DC

## 2014-05-29 NOTE — Progress Notes (Signed)
Subjective:    Patient ID: [name], female    DOB: Nov 10, 1950, 64 y.o.   MRN: [id number]  HPI: [name] is a 64 year old female who returns for follow up for chronic pain and medication refill. She says her pain is located in her lower back and bilateral knees. She rates her pain 7. Her current exercise regime is performing chair exercises 3-4 times a day and walking daily to mailbox. She states she seen her allergist and she's allergic to her lisinopril.  Her PCP is following. Encourage to call her PCP regarding her allergist finding she verbalizes understanding. She arrived bradycardic pulse re-checked 75 and o2 sats 95%.   Pain Inventory Average Pain 8 Pain Right Now 7 My pain is sharp, burning, stabbing, tingling and aching  In the last 24 hours, has pain interfered with the following? General activity 7 Relation with others 5 Enjoyment of life 8 What TIME of day is your pain at its worst? night Sleep (in general) Poor  Pain is worse with: walking, bending, standing and some activites Pain improves with: heat/ice and medication Relief from Meds: 5  Mobility walk without assistance use a cane use a walker how many minutes can you walk? 5 do you drive?  yes  Function disabled: date disabled . I need assistance with the following:  meal prep, household duties and shopping  Neuro/Psych weakness tingling dizziness depression anxiety  Prior Studies Any changes since last visit?  no  Physicians involved in your care Any changes since last visit?  no   Family History  Problem Relation Age of Onset  . Cancer    . Coronary artery disease     History   Social History  . Marital Status: Legally Separated    Spouse Name: N/A  . Number of Children: N/A  . Years of Education: N/A   Occupational History  . Disabled    Social History Main Topics  . Smoking status: Never Smoker   . Smokeless tobacco: Never Used  . Alcohol Use: No  . Drug  Use: No  . Sexual Activity: Not on file   Other Topics Concern  . None   Social History Narrative   Patient does not get regular exercise   Past Surgical History  Procedure Laterality Date  . Coronary angioplasty with stent placement    . Colonoscopy  08/06/2011    Procedure: COLONOSCOPY;  Surgeon: Rogene Houston, MD;  [facility];  Service: Endoscopy;  Laterality: N/A;  215  . Arm surgery Bilateral     due to fall-pt broke both forearms  . Colonoscopy with propofol N/A 12/31/2012    Procedure: COLONOSCOPY WITH PROPOFOL;  Surgeon: Rogene Houston, MD;  Location: [facility];  Service: Endoscopy;  Laterality: N/A;  in cecum at 0814, total withdrawal time 71mn  . Esophagogastroduodenoscopy (egd) with propofol N/A 12/31/2012    Procedure: ESOPHAGOGASTRODUODENOSCOPY (EGD) WITH PROPOFOL;  Surgeon: NRogene Houston MD;  Location: [facility];  Service: Endoscopy;  Laterality: N/A;  GE junction 37,  . Maloney dilation N/A 12/31/2012    Procedure: MVenia MinksDILATION;  Surgeon: NRogene Houston MD;  Location: [facility];  Service: Endoscopy;  Laterality: N/A;  used a # 54,#56  . Cholecystectomy    . Abdominal hysterectomy    . Esophagogastroduodenoscopy (egd) with propofol N/A 07/01/2013    Procedure: ESOPHAGOGASTRODUODENOSCOPY (EGD) WITH PROPOFOL;  Surgeon: NRogene Houston MD;  Location: [facility];  Service: Endoscopy;  Laterality: N/A;  950  . Maloney dilation N/A 07/01/2013    Procedure: Venia Minks DILATION;  Surgeon: Rogene Houston, MD;  Location: [facility];  Service: Endoscopy;  Laterality: N/A;  54/58; no heme present  . Esophageal biopsy N/A 07/01/2013    Procedure: BIOPSY;  Surgeon: Rogene Houston, MD;  Location: [facility];  Service: Endoscopy;  Laterality: N/A;   Past Medical History  Diagnosis Date  . Palpitations   . Tachycardia   . CAD (coronary artery disease)     Stent placement circumflex coronary 2007, catheterization 2008 patent stents. Normal LV function  . Chest pain   . Diabetes  mellitus     Insulin dependent  . Dyslipidemia   . Anxiety and depression   . Diabetic polyneuropathy      severe on multiple medications  . 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 MI (myocardial infarction)     2007  . Anxiety   . Depression   . Obstructive sleep apnea     CPAP machine at night  . COPD (chronic obstructive pulmonary disease)   . GERD (gastroesophageal reflux disease)   . CHF (congestive heart failure)   . Headache(784.0)   . Arthritis    BP 118/61 mmHg  Pulse 57  Resp 14  SpO2 91%  Opioid Risk Score:   Fall Risk Score: Moderate Fall Risk (6-13 points) (preveioulsy educated and given handout)  Review of Systems  Respiratory: Positive for cough.        Related to ace inhibitor  Gastrointestinal: Positive for nausea, vomiting, abdominal pain and constipation.  Neurological: Positive for dizziness and weakness.       Tingling  Psychiatric/Behavioral: Positive for dysphoric mood. The patient is nervous/anxious.   All other systems reviewed and are negative.      Objective:   Physical Exam  Constitutional: She is oriented to person, place, and time. She appears well-developed and well-nourished.  HENT:  Head: Normocephalic and atraumatic.  Neck: Normal range of motion. Neck supple.  Cardiovascular: Normal rate and regular rhythm.   Pulmonary/Chest: Effort normal and breath sounds normal.  Musculoskeletal:  Normal Muscle Bu;lk and Muscle Testing Reveals: Upper Extremities: Full ROM and Muscle Strength 5/5 Thoracic and Lumbar Hypersensitivity Lower Extremities: Full ROM and Muscle Strength 5/5 Arises from chair with ease/ using cadillac walker Narrow Based Gait   Neurological: She is alert and oriented to person, place, and time.  Skin: Skin is warm and dry.  Psychiatric: She has a normal mood and affect.  Nursing note and vitals reviewed.         Assessment & Plan:  1.Lumbar pain lumbar spondylosis:  Refilled: Opana 30 mg one tablet every 12 hours as  needed #60  2. Severe diabetic poly neuropathy: Continue to Monitor 3. Insomnia: On Trazodone PCP Following  20 minutes of face to face patient care time was spent during this visit. All questions were encouraged and answered.  F/U in 1 Month.

## 2014-06-26 ENCOUNTER — Encounter: Payer: Self-pay | Admitting: Physical Medicine & Rehabilitation

## 2014-06-26 ENCOUNTER — Ambulatory Visit (HOSPITAL_BASED_OUTPATIENT_CLINIC_OR_DEPARTMENT_OTHER): Payer: Medicaid Other | Admitting: Physical Medicine & Rehabilitation

## 2014-06-26 ENCOUNTER — Encounter: Payer: Medicaid Other | Attending: Physical Medicine & Rehabilitation

## 2014-06-26 VITALS — BP 112/56 | HR 64 | Resp 14

## 2014-06-26 DIAGNOSIS — K219 Gastro-esophageal reflux disease without esophagitis: Secondary | ICD-10-CM | POA: Insufficient documentation

## 2014-06-26 DIAGNOSIS — E1142 Type 2 diabetes mellitus with diabetic polyneuropathy: Secondary | ICD-10-CM | POA: Insufficient documentation

## 2014-06-26 DIAGNOSIS — M47896 Other spondylosis, lumbar region: Secondary | ICD-10-CM | POA: Insufficient documentation

## 2014-06-26 DIAGNOSIS — M1990 Unspecified osteoarthritis, unspecified site: Secondary | ICD-10-CM | POA: Diagnosis not present

## 2014-06-26 DIAGNOSIS — Z1401 Asymptomatic hemophilia A carrier: Secondary | ICD-10-CM | POA: Diagnosis not present

## 2014-06-26 DIAGNOSIS — R002 Palpitations: Secondary | ICD-10-CM | POA: Diagnosis not present

## 2014-06-26 DIAGNOSIS — M79605 Pain in left leg: Secondary | ICD-10-CM | POA: Diagnosis not present

## 2014-06-26 DIAGNOSIS — F418 Other specified anxiety disorders: Secondary | ICD-10-CM | POA: Diagnosis not present

## 2014-06-26 DIAGNOSIS — I509 Heart failure, unspecified: Secondary | ICD-10-CM | POA: Insufficient documentation

## 2014-06-26 DIAGNOSIS — E785 Hyperlipidemia, unspecified: Secondary | ICD-10-CM | POA: Diagnosis not present

## 2014-06-26 DIAGNOSIS — Z794 Long term (current) use of insulin: Secondary | ICD-10-CM | POA: Insufficient documentation

## 2014-06-26 DIAGNOSIS — J449 Chronic obstructive pulmonary disease, unspecified: Secondary | ICD-10-CM | POA: Diagnosis not present

## 2014-06-26 DIAGNOSIS — Z760 Encounter for issue of repeat prescription: Secondary | ICD-10-CM | POA: Diagnosis not present

## 2014-06-26 DIAGNOSIS — R000 Tachycardia, unspecified: Secondary | ICD-10-CM | POA: Diagnosis not present

## 2014-06-26 DIAGNOSIS — I252 Old myocardial infarction: Secondary | ICD-10-CM | POA: Diagnosis not present

## 2014-06-26 DIAGNOSIS — R079 Chest pain, unspecified: Secondary | ICD-10-CM | POA: Insufficient documentation

## 2014-06-26 DIAGNOSIS — E1140 Type 2 diabetes mellitus with diabetic neuropathy, unspecified: Secondary | ICD-10-CM | POA: Diagnosis not present

## 2014-06-26 DIAGNOSIS — R51 Headache: Secondary | ICD-10-CM | POA: Insufficient documentation

## 2014-06-26 DIAGNOSIS — I2510 Atherosclerotic heart disease of native coronary artery without angina pectoris: Secondary | ICD-10-CM | POA: Diagnosis not present

## 2014-06-26 DIAGNOSIS — M79604 Pain in right leg: Secondary | ICD-10-CM | POA: Insufficient documentation

## 2014-06-26 DIAGNOSIS — M5489 Other dorsalgia: Secondary | ICD-10-CM | POA: Insufficient documentation

## 2014-06-26 DIAGNOSIS — G4700 Insomnia, unspecified: Secondary | ICD-10-CM | POA: Diagnosis not present

## 2014-06-26 DIAGNOSIS — M47816 Spondylosis without myelopathy or radiculopathy, lumbar region: Secondary | ICD-10-CM | POA: Diagnosis not present

## 2014-06-26 DIAGNOSIS — G4733 Obstructive sleep apnea (adult) (pediatric): Secondary | ICD-10-CM | POA: Insufficient documentation

## 2014-06-26 DIAGNOSIS — G8929 Other chronic pain: Secondary | ICD-10-CM | POA: Diagnosis not present

## 2014-06-26 MED ORDER — OXYMORPHONE HCL ER 30 MG PO TB12: 30.0000 mg | ORAL_TABLET | Freq: Two times a day (BID) | ORAL | Status: DC

## 2014-06-26 NOTE — Progress Notes (Signed)
Subjective:    Patient ID: [name], female    DOB: Feb 17, 1951, 64 y.o.   MRN: [id number]  HPI Low back pain as well as bilateral knee pain, Seems like back pain radiates to the knees Head lumbar MRI demonstrating some facet arthropathy L4-5 but no evidence of significant stenosis in 2014. This was followed by lumbar medial branch blocks L3 L4 L5 in June 2014 which were not helpful. Pain Inventory Average Pain 8 Pain Right Now 7 My pain is sharp, burning, stabbing, tingling and aching  In the last 24 hours, has pain interfered with the following? General activity 8 Relation with others 7 Enjoyment of life 8 What TIME of day is your pain at its worst? evening, night Sleep (in general) Fair  Pain is worse with: walking, bending, sitting, standing and some activites Pain improves with: heat/ice and medication Relief from Meds: 4  Mobility walk with assistance use a cane use a walker how many minutes can you walk? 5 ability to climb steps?  no do you drive?  no Do you have any goals in this area?  yes  Function disabled: date disabled . I need assistance with the following:  bathing, household duties and shopping  Neuro/Psych weakness tingling trouble walking dizziness depression anxiety  Prior Studies Any changes since last visit?  no  Physicians involved in your care Any changes since last visit?  no   Family History  Problem Relation Age of Onset  . Cancer    . Coronary artery disease     History   Social History  . Marital Status: Legally Separated    Spouse Name: N/A  . Number of Children: N/A  . Years of Education: N/A   Occupational History  . Disabled    Social History Main Topics  . Smoking status: Never Smoker   . Smokeless tobacco: Never Used  . Alcohol Use: No  . Drug Use: No  . Sexual Activity: Not on file   Other Topics Concern  . None   Social History Narrative   Patient does not get regular exercise   Past Surgical  History  Procedure Laterality Date  . Coronary angioplasty with stent placement    . Colonoscopy  08/06/2011    Procedure: COLONOSCOPY;  Surgeon: Rogene Houston, MD;  [facility];  Service: Endoscopy;  Laterality: N/A;  215  . Arm surgery Bilateral     due to fall-pt broke both forearms  . Colonoscopy with propofol N/A 12/31/2012    Procedure: COLONOSCOPY WITH PROPOFOL;  Surgeon: Rogene Houston, MD;  Location: [facility];  Service: Endoscopy;  Laterality: N/A;  in cecum at 0814, total withdrawal time 89mn  . Esophagogastroduodenoscopy (egd) with propofol N/A 12/31/2012    Procedure: ESOPHAGOGASTRODUODENOSCOPY (EGD) WITH PROPOFOL;  Surgeon: NRogene Houston MD;  Location: [facility];  Service: Endoscopy;  Laterality: N/A;  GE junction 37,  . Maloney dilation N/A 12/31/2012    Procedure: MVenia MinksDILATION;  Surgeon: NRogene Houston MD;  Location: [facility];  Service: Endoscopy;  Laterality: N/A;  used a # 54,#56  . Cholecystectomy    . Abdominal hysterectomy    . Esophagogastroduodenoscopy (egd) with propofol N/A 07/01/2013    Procedure: ESOPHAGOGASTRODUODENOSCOPY (EGD) WITH PROPOFOL;  Surgeon: NRogene Houston MD;  Location: [facility];  Service: Endoscopy;  Laterality: N/A;  950  . Maloney dilation N/A 07/01/2013    Procedure: MVenia MinksDILATION;  Surgeon: NRogene Houston MD;  Location: [facility];  Service: Endoscopy;  Laterality: N/A;  54/58; no heme present  . Esophageal biopsy N/A 07/01/2013    Procedure: BIOPSY;  Surgeon: Rogene Houston, MD;  Location: [facility];  Service: Endoscopy;  Laterality: N/A;   Past Medical History  Diagnosis Date  . Palpitations   . Tachycardia   . CAD (coronary artery disease)     Stent placement circumflex coronary 2007, catheterization 2008 patent stents. Normal LV function  . Chest pain   . Diabetes mellitus     Insulin dependent  . Dyslipidemia   . Anxiety and depression   . Diabetic polyneuropathy      severe on multiple medications  . 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 MI (myocardial infarction)     2007  . Anxiety   . Depression   . Obstructive sleep apnea     CPAP machine at night  . COPD (chronic obstructive pulmonary disease)   . GERD (gastroesophageal reflux disease)   . CHF (congestive heart failure)   . Headache(784.0)   . Arthritis    BP 112/56 mmHg  Pulse 64  Resp 14  SpO2 94%  Opioid Risk Score:   Fall Risk Score: Moderate Fall Risk (6-13 points)`1  Depression screen PHQ 2/9  No flowsheet data found.   Review of Systems  Gastrointestinal: Positive for nausea, vomiting and abdominal pain.  Neurological: Positive for dizziness and weakness.       Tingling  Psychiatric/Behavioral: Positive for dysphoric mood. The patient is nervous/anxious.   All other systems reviewed and are negative.      Objective:   Physical Exam Tenderness over bilateral greater trochanters. Pain from the greater trochanters radiates down to the thigh Some tenderness over the lumbosacral para spinals. Limited lumbar range of motion Morbidly obese  No skin issues in the lower extremities Mood and affect flat but otherwise appropriate      Assessment & Plan:  1. Painful diabetic neuropathy. This is not her primary complaint today.Affecting both feet below the knees. No symptoms in the hands  No evidence of skin breakdown.   Continue gabapentin 800 mg 3 times a day  Continue Opana ER 30 mg twice a day  Continue opioid monitoring program. This consists of regular clinic visits, examinations, urine drug screen, pill counts as well as use of [place] controlled substance reporting System.  2. Chronic low back pain with lumbar spondylosis but no significant spinal stenosis. Encouraged patient stretching , weight loss, has seen Nutritionist in past , asked pt to review materials Return to clinic one month to see nurse practitioner  3. Trochanteric bursitis printout exercises for this, recommend weight loss as well, ultrasound guided  injections if patient decides upon this  Nurse practitioner visit one month M.D. Visit in 6 months

## 2014-06-26 NOTE — Patient Instructions (Addendum)
Trochanteric Bursitis You have hip pain due to trochanteric bursitis. Bursitis means that the sack near the outside of the hip is filled with fluid and inflamed. This sack is made up of protective soft tissue. The pain from trochanteric bursitis can be severe and keep you from sleep. It can radiate to the buttocks or down the outside of the thigh to the knee. The pain is almost always worse when rising from the seated or lying position and with walking. Pain can improve after you take a few steps. It happens more often in people with hip joint and lumbar spine problems, such as arthritis or previous surgery. Very rarely the trochanteric bursa can become infected, and antibiotics and/or surgery may be needed. Treatment often includes an injection of local anesthetic mixed with cortisone medicine. This medicine is injected into the area where it is most tender over the hip. Repeat injections may be necessary if the response to treatment is slow. You can apply ice packs over the tender area for 30 minutes every 2 hours for the next few days. Anti-inflammatory and/or narcotic pain medicine may also be helpful. Limit your activity for the next few days if the pain continues. See your caregiver in 5-10 days if you are not greatly improved.  SEEK IMMEDIATE MEDICAL CARE IF:  You develop severe pain, fever, or increased redness.  You have pain that radiates below the knee. EXERCISES STRETCHING EXERCISES - Trochanteric Bursitis  These exercises may help you when beginning to rehabilitate your injury. Your symptoms may resolve with or without further involvement from your physician, physical therapist, or athletic trainer. While completing these exercises, remember:   Restoring tissue flexibility helps normal motion to return to the joints. This allows healthier, less painful movement and activity.  An effective stretch should be held for at least 30 seconds.  A stretch should never be painful. You should only  feel a gentle lengthening or release in the stretched tissue. STRETCH - Iliotibial Band  On the floor or bed, lie on your side so your injured leg is on top. Bend your knee and grab your ankle.  Slowly bring your knee back so that your thigh is in line with your trunk. Keep your heel at your buttocks and gently arch your back so your head, shoulders and hips line up.  Slowly lower your leg so that your knee approaches the floor/bed until you feel a gentle stretch on the outside of your thigh. If you do not feel a stretch and your knee will not fall farther, place the heel of your opposite foot on top of your knee and pull your thigh down farther.  Hold this stretch for __________ seconds.  Repeat __________ times. Complete this exercise __________ times per day. STRETCH - Hamstrings, Supine   Lie on your back. Loop a belt or towel over the ball of your foot as shown.  Straighten your knee and slowly pull on the belt to raise your injured leg. Do not allow the knee to bend. Keep your opposite leg flat on the floor.  Raise the leg until you feel a gentle stretch behind your knee or thigh. Hold this position for __________ seconds.  Repeat __________ times. Complete this stretch __________ times per day. STRETCH - Quadriceps, Prone   Lie on your stomach on a firm surface, such as a bed or padded floor.  Bend your knee and grasp your ankle. If you are unable to reach your ankle or pant leg, use a belt  around your foot to lengthen your reach.  Gently pull your heel toward your buttocks. Your knee should not slide out to the side. You should feel a stretch in the front of your thigh and/or knee.  Hold this position for __________ seconds.  Repeat __________ times. Complete this stretch __________ times per day. STRETCHING - Hip Flexors, Lunge Half kneel with your knee on the floor and your opposite knee bent and directly over your ankle.  Keep good posture with your head over your  shoulders. Tighten your buttocks to point your tailbone downward; this will prevent your back from arching too much.  You should feel a gentle stretch in the front of your thigh and/or hip. If you do not feel any resistance, slightly slide your opposite foot forward and then slowly lunge forward so your knee once again lines up over your ankle. Be sure your tailbone remains pointed downward.  Hold this stretch for __________ seconds.  Repeat __________ times. Complete this stretch __________ times per day. STRETCH - Adductors, Lunge  While standing, spread your legs.  Lean away from your injured leg by bending your opposite knee. You may rest your hands on your thigh for balance.  You should feel a stretch in your inner thigh. Hold for __________ seconds.  Repeat __________ times. Complete this exercise __________ times per day. Document Released: 04/10/2004 Document Revised: 07/18/2013 Document Reviewed: 06/15/2008 [facility] Patient Information 2015 [place], [place]. This information is not intended to replace advice given to you by your health care provider. Make sure you discuss any questions you have with your health care provider.

## 2014-07-24 ENCOUNTER — Encounter: Payer: Self-pay | Admitting: Registered Nurse

## 2014-07-24 ENCOUNTER — Encounter: Payer: Medicaid Other | Attending: Physical Medicine & Rehabilitation | Admitting: Registered Nurse

## 2014-07-24 ENCOUNTER — Ambulatory Visit: Payer: Medicaid Other | Admitting: Physical Medicine & Rehabilitation

## 2014-07-24 VITALS — BP 108/55 | HR 66 | Resp 14

## 2014-07-24 DIAGNOSIS — M79605 Pain in left leg: Secondary | ICD-10-CM | POA: Diagnosis not present

## 2014-07-24 DIAGNOSIS — G4733 Obstructive sleep apnea (adult) (pediatric): Secondary | ICD-10-CM | POA: Insufficient documentation

## 2014-07-24 DIAGNOSIS — M25561 Pain in right knee: Secondary | ICD-10-CM

## 2014-07-24 DIAGNOSIS — K219 Gastro-esophageal reflux disease without esophagitis: Secondary | ICD-10-CM | POA: Diagnosis not present

## 2014-07-24 DIAGNOSIS — J449 Chronic obstructive pulmonary disease, unspecified: Secondary | ICD-10-CM | POA: Diagnosis not present

## 2014-07-24 DIAGNOSIS — R002 Palpitations: Secondary | ICD-10-CM | POA: Diagnosis not present

## 2014-07-24 DIAGNOSIS — M47896 Other spondylosis, lumbar region: Secondary | ICD-10-CM | POA: Insufficient documentation

## 2014-07-24 DIAGNOSIS — I2510 Atherosclerotic heart disease of native coronary artery without angina pectoris: Secondary | ICD-10-CM | POA: Diagnosis not present

## 2014-07-24 DIAGNOSIS — I252 Old myocardial infarction: Secondary | ICD-10-CM | POA: Diagnosis not present

## 2014-07-24 DIAGNOSIS — E1140 Type 2 diabetes mellitus with diabetic neuropathy, unspecified: Secondary | ICD-10-CM

## 2014-07-24 DIAGNOSIS — G8929 Other chronic pain: Secondary | ICD-10-CM | POA: Insufficient documentation

## 2014-07-24 DIAGNOSIS — Z5181 Encounter for therapeutic drug level monitoring: Secondary | ICD-10-CM

## 2014-07-24 DIAGNOSIS — M47816 Spondylosis without myelopathy or radiculopathy, lumbar region: Secondary | ICD-10-CM | POA: Diagnosis not present

## 2014-07-24 DIAGNOSIS — R000 Tachycardia, unspecified: Secondary | ICD-10-CM | POA: Insufficient documentation

## 2014-07-24 DIAGNOSIS — M1990 Unspecified osteoarthritis, unspecified site: Secondary | ICD-10-CM | POA: Diagnosis not present

## 2014-07-24 DIAGNOSIS — E785 Hyperlipidemia, unspecified: Secondary | ICD-10-CM | POA: Insufficient documentation

## 2014-07-24 DIAGNOSIS — R51 Headache: Secondary | ICD-10-CM | POA: Diagnosis not present

## 2014-07-24 DIAGNOSIS — M25562 Pain in left knee: Secondary | ICD-10-CM | POA: Diagnosis not present

## 2014-07-24 DIAGNOSIS — Z760 Encounter for issue of repeat prescription: Secondary | ICD-10-CM | POA: Diagnosis not present

## 2014-07-24 DIAGNOSIS — Z794 Long term (current) use of insulin: Secondary | ICD-10-CM | POA: Insufficient documentation

## 2014-07-24 DIAGNOSIS — G894 Chronic pain syndrome: Secondary | ICD-10-CM

## 2014-07-24 DIAGNOSIS — R079 Chest pain, unspecified: Secondary | ICD-10-CM | POA: Diagnosis not present

## 2014-07-24 DIAGNOSIS — E1142 Type 2 diabetes mellitus with diabetic polyneuropathy: Secondary | ICD-10-CM | POA: Insufficient documentation

## 2014-07-24 DIAGNOSIS — M5489 Other dorsalgia: Secondary | ICD-10-CM | POA: Insufficient documentation

## 2014-07-24 DIAGNOSIS — F418 Other specified anxiety disorders: Secondary | ICD-10-CM | POA: Diagnosis not present

## 2014-07-24 DIAGNOSIS — M79604 Pain in right leg: Secondary | ICD-10-CM | POA: Diagnosis not present

## 2014-07-24 DIAGNOSIS — Z1401 Asymptomatic hemophilia A carrier: Secondary | ICD-10-CM | POA: Insufficient documentation

## 2014-07-24 DIAGNOSIS — I509 Heart failure, unspecified: Secondary | ICD-10-CM | POA: Diagnosis not present

## 2014-07-24 DIAGNOSIS — G4700 Insomnia, unspecified: Secondary | ICD-10-CM | POA: Insufficient documentation

## 2014-07-24 DIAGNOSIS — Z79899 Other long term (current) drug therapy: Secondary | ICD-10-CM

## 2014-07-24 MED ORDER — OXYMORPHONE HCL ER 30 MG PO TB12: 30.0000 mg | ORAL_TABLET | Freq: Two times a day (BID) | ORAL | Status: DC

## 2014-07-24 NOTE — Progress Notes (Signed)
Subjective:    Patient ID: [name], female    DOB: [birth year], 64 y.o.   MRN: [id number] HPI: [name] is a 64 year old female who returns for follow up for chronic pain and medication refill. She says her pain is located in her neck,lower back, bilateral knees and lower extremities. She rates her pain 7. Her current exercise regime is performing chair exercises 3-4 times a day and walking daily to mailbox.    Pain Inventory Average Pain 8 Pain Right Now 7 My pain is sharp, burning, stabbing, tingling and aching  In the last 24 hours, has pain interfered with the following? General activity 8 Relation with others 7 Enjoyment of life 7 What TIME of day is your pain at its worst? night Sleep (in general) Poor  Pain is worse with: walking, bending, standing and some activites Pain improves with: heat/ice and medication Relief from Meds: 4  Mobility walk with assistance use a cane use a walker how many minutes can you walk? 5 ability to climb steps?  no do you drive?  no  Function disabled: date disabled . I need assistance with the following:  meal prep, household duties and shopping  Neuro/Psych weakness tingling dizziness depression anxiety  Prior Studies Any changes since last visit?  no  Physicians involved in your care Any changes since last visit?  no   Family History  Problem Relation Age of Onset  . Cancer    . Coronary artery disease     History   Social History  . Marital Status: Legally Separated    Spouse Name: N/A  . Number of Children: N/A  . Years of Education: N/A   Occupational History  . Disabled    Social History Main Topics  . Smoking status: Never Smoker   . Smokeless tobacco: Never Used  . Alcohol Use: No  . Drug Use: No  . Sexual Activity: Not on file   Other Topics Concern  . None   Social History Narrative   Patient does not get regular exercise   Past Surgical History  Procedure Laterality Date    . Coronary angioplasty with stent placement    . Colonoscopy  08/06/2011    Procedure: COLONOSCOPY;  Surgeon: Rogene Houston, MD;  [facility];  Service: Endoscopy;  Laterality: N/A;  215  . Arm surgery Bilateral     due to fall-pt broke both forearms  . Colonoscopy with propofol N/A 12/31/2012    Procedure: COLONOSCOPY WITH PROPOFOL;  Surgeon: Rogene Houston, MD;  Location: [facility];  Service: Endoscopy;  Laterality: N/A;  in cecum at 0814, total withdrawal time 58min  . Esophagogastroduodenoscopy (egd) with propofol N/A 12/31/2012    Procedure: ESOPHAGOGASTRODUODENOSCOPY (EGD) WITH PROPOFOL;  Surgeon: Rogene Houston, MD;  Location: [facility];  Service: Endoscopy;  Laterality: N/A;  GE junction 37,  . Maloney dilation N/A 12/31/2012    Procedure: Venia Minks DILATION;  Surgeon: Rogene Houston, MD;  Location: [facility];  Service: Endoscopy;  Laterality: N/A;  used a # 54,#56  . Cholecystectomy    . Abdominal hysterectomy    . Esophagogastroduodenoscopy (egd) with propofol N/A 07/01/2013    Procedure: ESOPHAGOGASTRODUODENOSCOPY (EGD) WITH PROPOFOL;  Surgeon: Rogene Houston, MD;  [facility]: [facility];  Service: Endoscopy;  Laterality: N/A;  950  . Maloney dilation N/A 07/01/2013    Procedure: Venia Minks DILATION;  Surgeon: Rogene Houston, MD;  Location: [facility];  Service: Endoscopy;  Laterality: N/A;  54/58; no heme present  . Esophageal biopsy N/A 07/01/2013    Procedure: BIOPSY;  Surgeon: Rogene Houston, MD;  Location: [facility];  Service: Endoscopy;  Laterality: N/A;   Past Medical History  Diagnosis Date  . Palpitations   . Tachycardia   . CAD (coronary artery disease)     Stent placement circumflex coronary 2007, catheterization 2008 patent stents. Normal LV function  . Chest pain   . Diabetes mellitus     Insulin dependent  . Dyslipidemia   . Anxiety and depression   . Diabetic polyneuropathy      severe on multiple medications  . 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 MI (myocardial infarction)      2007  . Anxiety   . Depression   . Obstructive sleep apnea     CPAP machine at night  . COPD (chronic obstructive pulmonary disease)   . GERD (gastroesophageal reflux disease)   . CHF (congestive heart failure)   . Headache(784.0)   . Arthritis    BP 108/55 mmHg  Pulse 66  Resp 14  SpO2 93%  Opioid Risk Score:   Fall Risk Score: Moderate Fall Risk (6-13 points)`1  Depression screen PHQ 2/9  No flowsheet data found.   Review of Systems  Gastrointestinal: Positive for nausea, vomiting, abdominal pain and diarrhea.  Neurological: Positive for dizziness and weakness.       Tingling  Psychiatric/Behavioral: Positive for dysphoric mood. The patient is nervous/anxious.   All other systems reviewed and are negative.      Objective:   Physical Exam  Constitutional: She is oriented to person, place, and time. She appears well-developed and well-nourished.  HENT:  Head: Normocephalic and atraumatic.  Neck: Normal range of motion. Neck supple.  Cervical Paraspinal Tenderness: C-3- C-5  Cardiovascular: Normal rate and regular rhythm.   Pulmonary/Chest: Effort normal and breath sounds normal.  Musculoskeletal:  Normal Muscle Bulk and Muscle Testing Reveals: Upper extremities: Full ROM and Muscle Strength 5/5 Right AC Joint Tenderness Thoracic Tightness Lumbar Paraspinal Tenderness: L-3- L-5 Lower Extremities: Full ROM and Muscle Strength 5/5 Arises from chair with ease/ Using walker for support Narrow based Gait  Neurological: She is alert and oriented to person, place, and time.  Skin: Skin is warm and dry.  Psychiatric: She has a normal mood and affect.  Nursing note and vitals reviewed.         Assessment & Plan:  1.Lumbar pain lumbar spondylosis: Encouraged to increase Activity. Refilled: Opana 30 mg one tablet every 12 hours as needed #60  2. Severe diabetic poly neuropathy: Continue to Monitor 3. Insomnia: On Trazodone PCP Following   20 minutes of  face to face patient care time was spent during this visit. All questions were encouraged and answered.  F/U in 1 Month.

## 2014-08-03 ENCOUNTER — Other Ambulatory Visit: Payer: Self-pay | Admitting: Physical Medicine & Rehabilitation

## 2014-08-10 ENCOUNTER — Other Ambulatory Visit (INDEPENDENT_AMBULATORY_CARE_PROVIDER_SITE_OTHER): Payer: Self-pay | Admitting: Internal Medicine

## 2014-08-10 MED ORDER — PANTOPRAZOLE SODIUM 40 MG PO TBEC: 40.0000 mg | DELAYED_RELEASE_TABLET | Freq: Two times a day (BID) | ORAL | Status: DC

## 2014-08-10 NOTE — Telephone Encounter (Signed)
Protonix refilled

## 2014-08-21 ENCOUNTER — Encounter: Payer: Self-pay | Admitting: Registered Nurse

## 2014-08-21 ENCOUNTER — Encounter: Payer: Medicaid Other | Attending: Physical Medicine & Rehabilitation | Admitting: Registered Nurse

## 2014-08-21 VITALS — BP 129/68 | HR 87 | Resp 16

## 2014-08-21 DIAGNOSIS — R51 Headache: Secondary | ICD-10-CM | POA: Diagnosis not present

## 2014-08-21 DIAGNOSIS — M47896 Other spondylosis, lumbar region: Secondary | ICD-10-CM | POA: Insufficient documentation

## 2014-08-21 DIAGNOSIS — R079 Chest pain, unspecified: Secondary | ICD-10-CM | POA: Insufficient documentation

## 2014-08-21 DIAGNOSIS — E1142 Type 2 diabetes mellitus with diabetic polyneuropathy: Secondary | ICD-10-CM | POA: Insufficient documentation

## 2014-08-21 DIAGNOSIS — K219 Gastro-esophageal reflux disease without esophagitis: Secondary | ICD-10-CM | POA: Insufficient documentation

## 2014-08-21 DIAGNOSIS — M25562 Pain in left knee: Secondary | ICD-10-CM

## 2014-08-21 DIAGNOSIS — I509 Heart failure, unspecified: Secondary | ICD-10-CM | POA: Diagnosis not present

## 2014-08-21 DIAGNOSIS — I2510 Atherosclerotic heart disease of native coronary artery without angina pectoris: Secondary | ICD-10-CM | POA: Insufficient documentation

## 2014-08-21 DIAGNOSIS — M79605 Pain in left leg: Secondary | ICD-10-CM | POA: Diagnosis not present

## 2014-08-21 DIAGNOSIS — Z79899 Other long term (current) drug therapy: Secondary | ICD-10-CM

## 2014-08-21 DIAGNOSIS — M79604 Pain in right leg: Secondary | ICD-10-CM | POA: Insufficient documentation

## 2014-08-21 DIAGNOSIS — M1990 Unspecified osteoarthritis, unspecified site: Secondary | ICD-10-CM | POA: Insufficient documentation

## 2014-08-21 DIAGNOSIS — Z794 Long term (current) use of insulin: Secondary | ICD-10-CM | POA: Diagnosis not present

## 2014-08-21 DIAGNOSIS — I252 Old myocardial infarction: Secondary | ICD-10-CM | POA: Diagnosis not present

## 2014-08-21 DIAGNOSIS — G8929 Other chronic pain: Secondary | ICD-10-CM | POA: Insufficient documentation

## 2014-08-21 DIAGNOSIS — R002 Palpitations: Secondary | ICD-10-CM | POA: Diagnosis not present

## 2014-08-21 DIAGNOSIS — M5489 Other dorsalgia: Secondary | ICD-10-CM | POA: Diagnosis not present

## 2014-08-21 DIAGNOSIS — F418 Other specified anxiety disorders: Secondary | ICD-10-CM | POA: Diagnosis not present

## 2014-08-21 DIAGNOSIS — G4733 Obstructive sleep apnea (adult) (pediatric): Secondary | ICD-10-CM | POA: Diagnosis not present

## 2014-08-21 DIAGNOSIS — Z5181 Encounter for therapeutic drug level monitoring: Secondary | ICD-10-CM

## 2014-08-21 DIAGNOSIS — G4700 Insomnia, unspecified: Secondary | ICD-10-CM | POA: Diagnosis not present

## 2014-08-21 DIAGNOSIS — J449 Chronic obstructive pulmonary disease, unspecified: Secondary | ICD-10-CM | POA: Diagnosis not present

## 2014-08-21 DIAGNOSIS — G894 Chronic pain syndrome: Secondary | ICD-10-CM

## 2014-08-21 DIAGNOSIS — M47816 Spondylosis without myelopathy or radiculopathy, lumbar region: Secondary | ICD-10-CM | POA: Diagnosis not present

## 2014-08-21 DIAGNOSIS — M25561 Pain in right knee: Secondary | ICD-10-CM

## 2014-08-21 DIAGNOSIS — E785 Hyperlipidemia, unspecified: Secondary | ICD-10-CM | POA: Diagnosis not present

## 2014-08-21 DIAGNOSIS — Z1401 Asymptomatic hemophilia A carrier: Secondary | ICD-10-CM | POA: Diagnosis not present

## 2014-08-21 DIAGNOSIS — R000 Tachycardia, unspecified: Secondary | ICD-10-CM | POA: Diagnosis not present

## 2014-08-21 DIAGNOSIS — Z760 Encounter for issue of repeat prescription: Secondary | ICD-10-CM | POA: Insufficient documentation

## 2014-08-21 DIAGNOSIS — E1140 Type 2 diabetes mellitus with diabetic neuropathy, unspecified: Secondary | ICD-10-CM

## 2014-08-21 MED ORDER — OXYMORPHONE HCL ER 30 MG PO TB12: 30.0000 mg | ORAL_TABLET | Freq: Two times a day (BID) | ORAL | Status: DC

## 2014-08-21 NOTE — Progress Notes (Signed)
Subjective:    Patient ID: [name], female    DOB: 1951/01/02, 64 y.o.   MRN: [id number]  HPI: [name] is a 64 year old female who returns for follow up for chronic pain and medication refill. She says her pain is located in her neck , mid-lower back and bilateral knees. She rates her pain 7. Her current exercise regime is performing chair exercises 3-4 times a day and walking daily to mailbox.   Pain Inventory Average Pain 8 Pain Right Now 7 My pain is constant, sharp, burning, stabbing, tingling and aching  In the last 24 hours, has pain interfered with the following? General activity 7 Relation with others 6 Enjoyment of life 8 What TIME of day is your pain at its worst? night Sleep (in general) Poor  Pain is worse with: walking, bending, standing and some activites Pain improves with: heat/ice and medication Relief from Meds: 3  Mobility use a walker how many minutes can you walk? 10 ability to climb steps?  no do you drive?  no  Function I need assistance with the following:  meal prep, household duties and shopping  Neuro/Psych bladder control problems weakness tingling dizziness depression anxiety  Prior Studies Any changes since last visit?  no  Physicians involved in your care Any changes since last visit?  no   Family History  Problem Relation Age of Onset  . Cancer    . Coronary artery disease     History   Social History  . Marital Status: Legally Separated    Spouse Name: N/A  . Number of Children: N/A  . Years of Education: N/A   Occupational History  . Disabled    Social History Main Topics  . Smoking status: Never Smoker   . Smokeless tobacco: Never Used  . Alcohol Use: No  . Drug Use: No  . Sexual Activity: Not on file   Other Topics Concern  . None   Social History Narrative   Patient does not get regular exercise   Past Surgical History  Procedure Laterality Date  . Coronary angioplasty with stent  placement    . Colonoscopy  08/06/2011    Procedure: COLONOSCOPY;  Surgeon: Rogene Houston, MD;  [facility];  Service: Endoscopy;  Laterality: N/A;  215  . Arm surgery Bilateral     due to fall-pt broke both forearms  . Colonoscopy with propofol N/A 12/31/2012    Procedure: COLONOSCOPY WITH PROPOFOL;  Surgeon: Rogene Houston, MD;  [facility];  Service: Endoscopy;  Laterality: N/A;  in cecum at 0814, total withdrawal time 29mn  . Esophagogastroduodenoscopy (egd) with propofol N/A 12/31/2012    Procedure: ESOPHAGOGASTRODUODENOSCOPY (EGD) WITH PROPOFOL;  Surgeon: NRogene Houston MD;  [facility];  Service: Endoscopy;  Laterality: N/A;  GE junction 37,  . Maloney dilation N/A 12/31/2012    Procedure: MVenia MinksDILATION;  Surgeon: NRogene Houston MD;  [facility];  Service: Endoscopy;  Laterality: N/A;  used a # 54,#56  . Cholecystectomy    . Abdominal hysterectomy    . Esophagogastroduodenoscopy (egd) with propofol N/A 07/01/2013    Procedure: ESOPHAGOGASTRODUODENOSCOPY (EGD) WITH PROPOFOL;  Surgeon: NRogene Houston MD;  [facility];  Service: Endoscopy;  Laterality: N/A;  950  . Maloney dilation N/A 07/01/2013    Procedure: MVenia MinksDILATION;  Surgeon: NRogene Houston MD;  [facility];  Service: Endoscopy;  Laterality: N/A;  54/58; no heme present  .  Esophageal biopsy N/A 07/01/2013    Procedure: BIOPSY;  Surgeon: Rogene Houston, MD;  Location: [facility];  Service: Endoscopy;  Laterality: N/A;   Past Medical History  Diagnosis Date  . Palpitations   . Tachycardia   . CAD (coronary artery disease)     Stent placement circumflex coronary 2007, catheterization 2008 patent stents. Normal LV function  . Chest pain   . Diabetes mellitus     Insulin dependent  . Dyslipidemia   . Anxiety and depression   . Diabetic polyneuropathy      severe on multiple medications  . 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 MI (myocardial infarction)     2007  . Anxiety   . Depression    . Obstructive sleep apnea     CPAP machine at night  . COPD (chronic obstructive pulmonary disease)   . GERD (gastroesophageal reflux disease)   . CHF (congestive heart failure)   . Headache(784.0)   . Arthritis    BP 129/68 mmHg  Pulse 87  Resp 16  SpO2 94%  Opioid Risk Score:   Fall Risk Score: Moderate Fall Risk (6-13 points) (previously edcuated and given handout)`1  Depression screen PHQ 2/9  No flowsheet data found.   Review of Systems  Gastrointestinal: Positive for nausea, abdominal pain, diarrhea and constipation.  Genitourinary:       Bladder control problems  Neurological: Positive for dizziness.       Tingling  Psychiatric/Behavioral: Positive for dysphoric mood. The patient is nervous/anxious.   All other systems reviewed and are negative.      Objective:   Physical Exam  Constitutional: She is oriented to person, place, and time. She appears well-developed and well-nourished.  HENT:  Head: Normocephalic and atraumatic.  Neck: Normal range of motion. Neck supple.  Cardiovascular: Normal rate and regular rhythm.   Pulmonary/Chest: Effort normal and breath sounds normal.  Musculoskeletal: She exhibits edema.  Normal Muscle Bulk and Muscle testing Reveals: Upper Extremities: Full ROM and Muscle Strength Right 4/5 and Left 5/5 Thoracic and Lumbar Hypersensitivity Lower Extremities: Full ROM and Muscle Strength 5/5 Bilateral Lower Extremities Flexion Produces pain into Patella's Arises from chair slowly/ Using straight cane Antalgic gait   Neurological: She is alert and oriented to person, place, and time.  Skin: Skin is warm and dry.  Psychiatric: She has a normal mood and affect.  Nursing note and vitals reviewed.         Assessment & Plan:  1.Lumbar pain lumbar spondylosis: Encouraged to increase Activity. Refilled: Opana 30 mg one tablet every 12 hours as needed #60. Second script given to accommodate scheduled appointment. 2. Severe  diabetic poly neuropathy: Continue to Monitor 3. Insomnia: On Trazodone PCP Following   20 minutes of face to face patient care time was spent during this visit. All questions were encouraged and answered.  F/U in 1 Month.

## 2014-08-22 ENCOUNTER — Other Ambulatory Visit: Payer: Self-pay | Admitting: Registered Nurse

## 2014-08-23 LAB — PMP ALCOHOL METABOLITE (ETG): Ethyl Glucuronide (EtG): NEGATIVE ng/mL

## 2014-08-26 LAB — OPIATES/OPIOIDS (LC/MS-MS)
Codeine Urine: NEGATIVE ng/mL (ref <50)
Hydrocodone: NEGATIVE ng/mL (ref <50)
Hydromorphone: NEGATIVE ng/mL (ref <50)
Morphine Urine: NEGATIVE ng/mL (ref <50)
Norhydrocodone, Ur: NEGATIVE ng/mL (ref <50)
Noroxycodone, Ur: NEGATIVE ng/mL (ref <50)
Oxycodone, ur: NEGATIVE ng/mL (ref <50)
Oxymorphone: 9397 ng/mL (ref <50)

## 2014-08-26 LAB — BENZODIAZEPINES (GC/LC/MS), URINE
Alprazolam metabolite (GC/LC/MS), ur confirm: 299 ng/mL — AB (ref <25)
Clonazepam metabolite (GC/LC/MS), ur confirm: NEGATIVE ng/mL (ref <25)
Flurazepam metabolite (GC/LC/MS), ur confirm: NEGATIVE ng/mL (ref <50)
Lorazepam (GC/LC/MS), ur confirm: NEGATIVE ng/mL (ref <50)
Midazolam (GC/LC/MS), ur confirm: NEGATIVE ng/mL (ref <50)
Nordiazepam (GC/LC/MS), ur confirm: NEGATIVE ng/mL (ref <50)
Oxazepam (GC/LC/MS), ur confirm: NEGATIVE ng/mL (ref <50)
Temazepam (GC/LC/MS), ur confirm: NEGATIVE ng/mL (ref <50)
Triazolam metabolite (GC/LC/MS), ur confirm: NEGATIVE ng/mL (ref <50)

## 2014-08-26 LAB — OXYCODONE, URINE (LC/MS-MS)
Noroxycodone, Ur: NEGATIVE ng/mL (ref <50)
Oxycodone, ur: NEGATIVE ng/mL (ref <50)
Oxymorphone: 9397 ng/mL (ref <50)

## 2014-08-29 LAB — PRESCRIPTION MONITORING PROFILE (SOLSTAS)
Amphetamine/Meth: NEGATIVE ng/mL
Barbiturate Screen, Urine: NEGATIVE ng/mL
Buprenorphine, Urine: NEGATIVE ng/mL
Cannabinoid Scrn, Ur: NEGATIVE ng/mL
Carisoprodol, Urine: NEGATIVE ng/mL
Cocaine Metabolites: NEGATIVE ng/mL
Creatinine, Urine: 116.49 mg/dL (ref >20.0)
Fentanyl, Ur: NEGATIVE ng/mL
MDMA URINE: NEGATIVE ng/mL
Meperidine, Ur: NEGATIVE ng/mL
Methadone Screen, Urine: NEGATIVE ng/mL
Nitrites, Initial: NEGATIVE ug/mL
Propoxyphene: NEGATIVE ng/mL
Tapentadol, urine: NEGATIVE ng/mL
Tramadol Scrn, Ur: NEGATIVE ng/mL
Zolpidem, Urine: NEGATIVE ng/mL
pH, Initial: 6.9 pH (ref 4.5–8.9)

## 2014-08-30 NOTE — Progress Notes (Signed)
Urine drug screen for this encounter is consistent for prescribed medication 

## 2014-09-11 ENCOUNTER — Other Ambulatory Visit: Payer: Self-pay

## 2014-10-02 ENCOUNTER — Encounter: Payer: Medicaid Other | Admitting: Registered Nurse

## 2014-10-12 ENCOUNTER — Telehealth: Payer: Self-pay | Admitting: *Deleted

## 2014-10-12 ENCOUNTER — Encounter: Payer: Self-pay | Admitting: Registered Nurse

## 2014-10-12 ENCOUNTER — Encounter: Payer: Medicaid Other | Attending: Physical Medicine & Rehabilitation | Admitting: Registered Nurse

## 2014-10-12 VITALS — BP 116/63 | HR 80 | Resp 14

## 2014-10-12 DIAGNOSIS — R002 Palpitations: Secondary | ICD-10-CM | POA: Insufficient documentation

## 2014-10-12 DIAGNOSIS — R000 Tachycardia, unspecified: Secondary | ICD-10-CM | POA: Insufficient documentation

## 2014-10-12 DIAGNOSIS — I2510 Atherosclerotic heart disease of native coronary artery without angina pectoris: Secondary | ICD-10-CM | POA: Insufficient documentation

## 2014-10-12 DIAGNOSIS — M545 Low back pain, unspecified: Secondary | ICD-10-CM

## 2014-10-12 DIAGNOSIS — Z5181 Encounter for therapeutic drug level monitoring: Secondary | ICD-10-CM

## 2014-10-12 DIAGNOSIS — I509 Heart failure, unspecified: Secondary | ICD-10-CM | POA: Insufficient documentation

## 2014-10-12 DIAGNOSIS — R079 Chest pain, unspecified: Secondary | ICD-10-CM | POA: Diagnosis not present

## 2014-10-12 DIAGNOSIS — G4733 Obstructive sleep apnea (adult) (pediatric): Secondary | ICD-10-CM | POA: Diagnosis not present

## 2014-10-12 DIAGNOSIS — J449 Chronic obstructive pulmonary disease, unspecified: Secondary | ICD-10-CM | POA: Insufficient documentation

## 2014-10-12 DIAGNOSIS — K219 Gastro-esophageal reflux disease without esophagitis: Secondary | ICD-10-CM | POA: Diagnosis not present

## 2014-10-12 DIAGNOSIS — M5489 Other dorsalgia: Secondary | ICD-10-CM | POA: Diagnosis not present

## 2014-10-12 DIAGNOSIS — R51 Headache: Secondary | ICD-10-CM | POA: Diagnosis not present

## 2014-10-12 DIAGNOSIS — I252 Old myocardial infarction: Secondary | ICD-10-CM | POA: Insufficient documentation

## 2014-10-12 DIAGNOSIS — E1142 Type 2 diabetes mellitus with diabetic polyneuropathy: Secondary | ICD-10-CM | POA: Diagnosis not present

## 2014-10-12 DIAGNOSIS — G4700 Insomnia, unspecified: Secondary | ICD-10-CM | POA: Insufficient documentation

## 2014-10-12 DIAGNOSIS — M47816 Spondylosis without myelopathy or radiculopathy, lumbar region: Secondary | ICD-10-CM | POA: Diagnosis not present

## 2014-10-12 DIAGNOSIS — Z79899 Other long term (current) drug therapy: Secondary | ICD-10-CM

## 2014-10-12 DIAGNOSIS — Z794 Long term (current) use of insulin: Secondary | ICD-10-CM | POA: Insufficient documentation

## 2014-10-12 DIAGNOSIS — F418 Other specified anxiety disorders: Secondary | ICD-10-CM | POA: Diagnosis not present

## 2014-10-12 DIAGNOSIS — M25561 Pain in right knee: Secondary | ICD-10-CM

## 2014-10-12 DIAGNOSIS — M25562 Pain in left knee: Secondary | ICD-10-CM

## 2014-10-12 DIAGNOSIS — Z760 Encounter for issue of repeat prescription: Secondary | ICD-10-CM | POA: Insufficient documentation

## 2014-10-12 DIAGNOSIS — M546 Pain in thoracic spine: Secondary | ICD-10-CM

## 2014-10-12 DIAGNOSIS — M1990 Unspecified osteoarthritis, unspecified site: Secondary | ICD-10-CM | POA: Insufficient documentation

## 2014-10-12 DIAGNOSIS — Z1401 Asymptomatic hemophilia A carrier: Secondary | ICD-10-CM | POA: Insufficient documentation

## 2014-10-12 DIAGNOSIS — E785 Hyperlipidemia, unspecified: Secondary | ICD-10-CM | POA: Diagnosis not present

## 2014-10-12 DIAGNOSIS — M79605 Pain in left leg: Secondary | ICD-10-CM | POA: Insufficient documentation

## 2014-10-12 DIAGNOSIS — M47896 Other spondylosis, lumbar region: Secondary | ICD-10-CM | POA: Insufficient documentation

## 2014-10-12 DIAGNOSIS — G8929 Other chronic pain: Secondary | ICD-10-CM

## 2014-10-12 DIAGNOSIS — M79604 Pain in right leg: Secondary | ICD-10-CM | POA: Insufficient documentation

## 2014-10-12 DIAGNOSIS — M25511 Pain in right shoulder: Secondary | ICD-10-CM | POA: Diagnosis not present

## 2014-10-12 DIAGNOSIS — G894 Chronic pain syndrome: Secondary | ICD-10-CM

## 2014-10-12 MED ORDER — DICLOFENAC SODIUM 1 % TD GEL: 2.0000 g | Freq: Three times a day (TID) | TRANSDERMAL | Status: DC

## 2014-10-12 MED ORDER — OXYMORPHONE HCL ER 30 MG PO TB12: 30.0000 mg | ORAL_TABLET | Freq: Two times a day (BID) | ORAL | Status: DC

## 2014-10-12 MED ORDER — METHYLPREDNISOLONE 4 MG PO TBPK: ORAL_TABLET | ORAL | Status: DC

## 2014-10-12 NOTE — Progress Notes (Signed)
Subjective:    Patient ID: [name], female    DOB: 02/09/1951, 64 y.o.   MRN: [id number]  HPI: [name] is a 64 year old female who returns for follow up for chronic pain and medication refill. She says her pain is located in her right shoulder,entire back and bilateral knees. Also states her pain has intensified in the last two to three weeks she denies falling. Facial Grimacing noted with movement and tearful. All questions answered and emotional support given. X-ray's and Medrol Dosepak ordered. She rates her pain 8. She hasn't followed her usual  exercise regime due to pain. Also complaining of Fatigue PCP following.  Pain Inventory Average Pain 8 Pain Right Now 8 My pain is constant, sharp, burning, stabbing, tingling and aching  In the last 24 hours, has pain interfered with the following? General activity 7 Relation with others 5 Enjoyment of life 8 What TIME of day is your pain at its worst? night Sleep (in general) Poor  Pain is worse with: walking, bending, standing and some activites Pain improves with: heat/ice, medication and voltaren gel Relief from Meds: 4  Mobility use a walker how many minutes can you walk? 10 ability to climb steps?  no do you drive?  yes  Function disabled: date disabled .  Neuro/Psych weakness tingling trouble walking spasms  Prior Studies Any changes since last visit?  no  Physicians involved in your care Any changes since last visit?  no   Family History  Problem Relation Age of Onset  . Cancer    . Coronary artery disease     History   Social History  . Marital Status: Legally Separated    Spouse Name: N/A  . Number of Children: N/A  . Years of Education: N/A   Occupational History  . Disabled    Social History Main Topics  . Smoking status: Never Smoker   . Smokeless tobacco: Never Used  . Alcohol Use: No  . Drug Use: No  . Sexual Activity: Not on file   Other Topics Concern  . None    Social History Narrative   Patient does not get regular exercise   Past Surgical History  Procedure Laterality Date  . Coronary angioplasty with stent placement    . Colonoscopy  08/06/2011    Procedure: COLONOSCOPY;  Surgeon: Rogene Houston, MD;  [facility];  Service: Endoscopy;  Laterality: N/A;  215  . Arm surgery Bilateral     due to fall-pt broke both forearms  . Colonoscopy with propofol N/A 12/31/2012    Procedure: COLONOSCOPY WITH PROPOFOL;  Surgeon: Rogene Houston, MD;  Location: [facility];  Service: Endoscopy;  Laterality: N/A;  in cecum at 0814, total withdrawal time 66mn  . Esophagogastroduodenoscopy (egd) with propofol N/A 12/31/2012    Procedure: ESOPHAGOGASTRODUODENOSCOPY (EGD) WITH PROPOFOL;  Surgeon: NRogene Houston MD;  Location: [facility];  Service: Endoscopy;  Laterality: N/A;  GE junction 37,  . [place] dilation N/A 12/31/2012    Procedure: MVenia MinksDILATION;  Surgeon: NRogene Houston MD;  Location: [facility];  Service: Endoscopy;  Laterality: N/A;  used a # 54,#56  . Cholecystectomy    . Abdominal hysterectomy    . Esophagogastroduodenoscopy (egd) with propofol N/A 07/01/2013    Procedure: ESOPHAGOGASTRODUODENOSCOPY (EGD) WITH PROPOFOL;  Surgeon: NRogene Houston MD;  Location: [facility];  Service: Endoscopy;  Laterality: N/A;  950  . [place] dilation N/A 07/01/2013    Procedure:  MALONEY DILATION;  Surgeon: Rogene Houston, MD;  Location: [facility];  Service: Endoscopy;  Laterality: N/A;  54/58; no heme present  . Esophageal biopsy N/A 07/01/2013    Procedure: BIOPSY;  Surgeon: Rogene Houston, MD;  Location: [facility];  Service: Endoscopy;  Laterality: N/A;   Past Medical History  Diagnosis Date  . Palpitations   . Tachycardia   . CAD (coronary artery disease)     Stent placement circumflex coronary 2007, catheterization 2008 patent stents. Normal LV function  . Chest pain   . Diabetes mellitus     Insulin dependent  . Dyslipidemia   . Anxiety and depression    . Diabetic polyneuropathy      severe on multiple medications  . 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 MI (myocardial infarction)     2007  . Anxiety   . Depression   . Obstructive sleep apnea     CPAP machine at night  . COPD (chronic obstructive pulmonary disease)   . GERD (gastroesophageal reflux disease)   . CHF (congestive heart failure)   . Headache(784.0)   . Arthritis    BP 116/63 mmHg  Pulse 80  Resp 14  SpO2 96%  Opioid Risk Score:   Fall Risk Score:  `1  Depression screen PHQ 2/9  No flowsheet data found.   Review of Systems  HENT: Negative.   Eyes: Negative.   Respiratory: Negative.   Cardiovascular: Negative.   Gastrointestinal: Positive for nausea, vomiting and abdominal pain.  Endocrine: Negative.   Genitourinary: Negative.   Musculoskeletal: Positive for myalgias, back pain, joint swelling, arthralgias and gait problem.       Bilateral knee pain  Skin: Negative.   Allergic/Immunologic: Negative.   Neurological: Positive for weakness.       Tingling, trouble walking  Hematological: Negative.   Psychiatric/Behavioral: Positive for dysphoric mood. The patient is nervous/anxious.        Objective:   Physical Exam  Constitutional: She is oriented to person, place, and time. She appears well-developed and well-nourished.  HENT:  Head: Normocephalic and atraumatic.  Neck: Normal range of motion. Neck supple.  Cardiovascular: Normal rate and regular rhythm.   Pulmonary/Chest: Effort normal and breath sounds normal.  Musculoskeletal:  Normal Muscle Bulk and Muscle Testing Reveals: Upper Extremities: Full ROM and Muscle Strength 5/5 Thoracic and Lumbar Hypersensitivity Lower Extremities: Full ROM and Muscle Strength 5/5 Bilateral Lower Extremities Flexion Produces Pain into Patella's, Bilateral upper extremities and Lumbar Arises from chair slowly using cadillac walker Antalgic gait  Neurological: She is alert and oriented to person, place, and time.  Skin:  Skin is warm and dry.  Psychiatric: She has a normal mood and affect.  Nursing note and vitals reviewed.         Assessment & Plan:  1.Lumbar pain lumbar spondylosis: Encouraged to increase Activity. Refilled: Opana 30 mg one tablet every 12 hours as needed #60.  2. Acute Exacerbation of chronic low back: RX: Thoracic and Lumbar X-Ray 3. Right Shoulder Pain: RX: X-ray 4. Bilateral Knee Pain: RX: X-ray 5. Severe diabetic poly neuropathy: Continue to Monitor 6. Insomnia: On Trazodone PCP Following   30 minutes of face to face patient care time was spent during this visit. All questions were encouraged and answered.  F/U in 1 Month.

## 2014-10-16 ENCOUNTER — Ambulatory Visit (HOSPITAL_COMMUNITY)
Admission: RE | Admit: 2014-10-16 | Discharge: 2014-10-16 | Disposition: A | Discharge status: Home / Self Care | Payer: Medicaid Other | Source: Ambulatory Visit | Attending: Registered Nurse | Admitting: Registered Nurse

## 2014-10-16 ENCOUNTER — Telehealth: Payer: Self-pay | Admitting: *Deleted

## 2014-10-16 DIAGNOSIS — M546 Pain in thoracic spine: Secondary | ICD-10-CM | POA: Insufficient documentation

## 2014-10-16 DIAGNOSIS — M25561 Pain in right knee: Secondary | ICD-10-CM | POA: Insufficient documentation

## 2014-10-16 DIAGNOSIS — M25511 Pain in right shoulder: Secondary | ICD-10-CM | POA: Diagnosis present

## 2014-10-16 DIAGNOSIS — M25562 Pain in left knee: Secondary | ICD-10-CM | POA: Insufficient documentation

## 2014-10-16 DIAGNOSIS — M545 Low back pain: Secondary | ICD-10-CM | POA: Insufficient documentation

## 2014-10-16 DIAGNOSIS — M4184 Other forms of scoliosis, thoracic region: Secondary | ICD-10-CM | POA: Insufficient documentation

## 2014-10-16 MED ORDER — ALPRAZOLAM 1 MG PO TABS: 1.0000 mg | ORAL_TABLET | Freq: Four times a day (QID) | ORAL | Status: DC

## 2014-10-16 NOTE — Telephone Encounter (Signed)
I called to check and see if an order for Xanax that was refilled today had been picked up.  The pharmacy checked and one was picked up on 10/13/14 but says it was prescribed by a Dr Clemmie Krill.  I asked that they check their voicemail and if there is an order from our office [facility] Dr Kirsteins/ Danella Sensing NP, that they please cancel the order.  We are not prescribing this medication.  They will check their voicemail.

## 2014-10-16 NOTE — Telephone Encounter (Signed)
Patient called and left message that she needed a refill on her Xanax - called into pharmacy

## 2014-10-31 ENCOUNTER — Other Ambulatory Visit (INDEPENDENT_AMBULATORY_CARE_PROVIDER_SITE_OTHER): Payer: Self-pay | Admitting: Internal Medicine

## 2014-11-13 ENCOUNTER — Ambulatory Visit (HOSPITAL_BASED_OUTPATIENT_CLINIC_OR_DEPARTMENT_OTHER): Payer: Medicaid Other | Admitting: Physical Medicine & Rehabilitation

## 2014-11-13 ENCOUNTER — Ambulatory Visit: Payer: Medicaid Other | Admitting: Physical Medicine & Rehabilitation

## 2014-11-13 ENCOUNTER — Encounter: Payer: Medicaid Other | Attending: Physical Medicine & Rehabilitation

## 2014-11-13 ENCOUNTER — Encounter: Payer: Self-pay | Admitting: Physical Medicine & Rehabilitation

## 2014-11-13 DIAGNOSIS — Z794 Long term (current) use of insulin: Secondary | ICD-10-CM | POA: Diagnosis not present

## 2014-11-13 DIAGNOSIS — M47896 Other spondylosis, lumbar region: Secondary | ICD-10-CM | POA: Insufficient documentation

## 2014-11-13 DIAGNOSIS — I252 Old myocardial infarction: Secondary | ICD-10-CM | POA: Insufficient documentation

## 2014-11-13 DIAGNOSIS — F418 Other specified anxiety disorders: Secondary | ICD-10-CM | POA: Diagnosis not present

## 2014-11-13 DIAGNOSIS — I509 Heart failure, unspecified: Secondary | ICD-10-CM | POA: Insufficient documentation

## 2014-11-13 DIAGNOSIS — M5489 Other dorsalgia: Secondary | ICD-10-CM | POA: Insufficient documentation

## 2014-11-13 DIAGNOSIS — Z760 Encounter for issue of repeat prescription: Secondary | ICD-10-CM | POA: Insufficient documentation

## 2014-11-13 DIAGNOSIS — R079 Chest pain, unspecified: Secondary | ICD-10-CM | POA: Insufficient documentation

## 2014-11-13 DIAGNOSIS — E1142 Type 2 diabetes mellitus with diabetic polyneuropathy: Secondary | ICD-10-CM | POA: Diagnosis not present

## 2014-11-13 DIAGNOSIS — R000 Tachycardia, unspecified: Secondary | ICD-10-CM | POA: Insufficient documentation

## 2014-11-13 DIAGNOSIS — R51 Headache: Secondary | ICD-10-CM | POA: Diagnosis not present

## 2014-11-13 DIAGNOSIS — G8929 Other chronic pain: Secondary | ICD-10-CM | POA: Diagnosis not present

## 2014-11-13 DIAGNOSIS — R002 Palpitations: Secondary | ICD-10-CM | POA: Insufficient documentation

## 2014-11-13 DIAGNOSIS — G4733 Obstructive sleep apnea (adult) (pediatric): Secondary | ICD-10-CM | POA: Insufficient documentation

## 2014-11-13 DIAGNOSIS — M19011 Primary osteoarthritis, right shoulder: Secondary | ICD-10-CM | POA: Diagnosis not present

## 2014-11-13 DIAGNOSIS — M79605 Pain in left leg: Secondary | ICD-10-CM | POA: Insufficient documentation

## 2014-11-13 DIAGNOSIS — M47816 Spondylosis without myelopathy or radiculopathy, lumbar region: Secondary | ICD-10-CM

## 2014-11-13 DIAGNOSIS — J449 Chronic obstructive pulmonary disease, unspecified: Secondary | ICD-10-CM | POA: Diagnosis not present

## 2014-11-13 DIAGNOSIS — K219 Gastro-esophageal reflux disease without esophagitis: Secondary | ICD-10-CM | POA: Insufficient documentation

## 2014-11-13 DIAGNOSIS — M79604 Pain in right leg: Secondary | ICD-10-CM | POA: Diagnosis not present

## 2014-11-13 DIAGNOSIS — I2510 Atherosclerotic heart disease of native coronary artery without angina pectoris: Secondary | ICD-10-CM | POA: Insufficient documentation

## 2014-11-13 DIAGNOSIS — E785 Hyperlipidemia, unspecified: Secondary | ICD-10-CM | POA: Insufficient documentation

## 2014-11-13 DIAGNOSIS — M1990 Unspecified osteoarthritis, unspecified site: Secondary | ICD-10-CM | POA: Insufficient documentation

## 2014-11-13 DIAGNOSIS — G4700 Insomnia, unspecified: Secondary | ICD-10-CM | POA: Diagnosis not present

## 2014-11-13 DIAGNOSIS — Z1401 Asymptomatic hemophilia A carrier: Secondary | ICD-10-CM | POA: Insufficient documentation

## 2014-11-13 MED ORDER — TRAMADOL HCL 50 MG PO TABS: 50.0000 mg | ORAL_TABLET | Freq: Two times a day (BID) | ORAL | Status: DC

## 2014-11-13 MED ORDER — OXYMORPHONE HCL ER 30 MG PO TB12: 30.0000 mg | ORAL_TABLET | Freq: Two times a day (BID) | ORAL | Status: DC

## 2014-11-13 NOTE — Patient Instructions (Signed)
Shoulder injection next month Back injection following month Tramadol for breakthrough

## 2014-11-13 NOTE — Progress Notes (Signed)
Subjective:    Patient ID: [name], female    DOB: Aug 09, 1950, 64 y.o.   MRN: [id number]  HPI Chief complaint: Shoulder pain and back pain 64 year old female primarily followed in this clinic for severe diabetic neuropathy. She's had multiple other pain complaints including shoulder back and knee pain. She was seen by nurse practitioner last visit and diagnostic imaging studies were ordered.  These were reviewed with the patient not only the reports but also the actual films. Clinical correlation was made.  Left knee is not quite as bad as the above but also causing pain and buckling. Right knee is not as symptomatic Patient has been on Voltaren gel for her knee. Pain Inventory Average Pain 9 Pain Right Now 8 My pain is sharp, burning, stabbing, tingling and aching  In the last 24 hours, has pain interfered with the following? General activity 8 Relation with others 9 Enjoyment of life 9 What TIME of day is your pain at its worst? morning and night Sleep (in general) Poor  Pain is worse with: walking, bending, sitting and standing Pain improves with: medication Relief from Meds: 2  Mobility use a walker ability to climb steps?  no do you drive?  no  Function disabled: date disabled . I need assistance with the following:  meal prep, household duties and shopping  Neuro/Psych bladder control problems weakness tingling trouble walking spasms dizziness depression anxiety  Prior Studies Any changes since last visit?  no  Physicians involved in your care Any changes since last visit?  no   Family History  Problem Relation Age of Onset  . Cancer    . Coronary artery disease     Social History   Social History  . Marital Status: Legally Separated    Spouse Name: N/A  . Number of Children: N/A  . Years of Education: N/A   Occupational History  . Disabled    Social History Main Topics  . Smoking status: Never Smoker   . Smokeless tobacco:  Never Used  . Alcohol Use: No  . Drug Use: No  . Sexual Activity: Not Asked   Other Topics Concern  . None   Social History Narrative   Patient does not get regular exercise   Past Surgical History  Procedure Laterality Date  . Coronary angioplasty with stent placement    . Colonoscopy  08/06/2011    Procedure: COLONOSCOPY;  Surgeon: Rogene Houston, MD;  [facility];  Service: Endoscopy;  Laterality: N/A;  215  . Arm surgery Bilateral     due to fall-pt broke both forearms  . Colonoscopy with propofol N/A 12/31/2012    Procedure: COLONOSCOPY WITH PROPOFOL;  Surgeon: Rogene Houston, MD;  Location: [facility];  Service: Endoscopy;  Laterality: N/A;  in cecum at 0814, total withdrawal time 58mn  . Esophagogastroduodenoscopy (egd) with propofol N/A 12/31/2012    Procedure: ESOPHAGOGASTRODUODENOSCOPY (EGD) WITH PROPOFOL;  Surgeon: NRogene Houston MD;  [facility];  Service: Endoscopy;  Laterality: N/A;  GE junction 37,  . Maloney dilation N/A 12/31/2012    Procedure: MVenia MinksDILATION;  Surgeon: NRogene Houston MD;  Location: [facility];  Service: Endoscopy;  Laterality: N/A;  used a # 54,#56  . Cholecystectomy    . Abdominal hysterectomy    . Esophagogastroduodenoscopy (egd) with propofol N/A 07/01/2013    Procedure: ESOPHAGOGASTRODUODENOSCOPY (EGD) WITH PROPOFOL;  Surgeon: NRogene Houston MD;  [facility]: [facility];  Service: Endoscopy;  Laterality: N/A;  [place] dilation N/A 07/01/2013    Procedure: Venia Minks DILATION;  Surgeon: Rogene Houston, MD;  Location: [facility];  Service: Endoscopy;  Laterality: N/A;  54/58; no heme present  . Esophageal biopsy N/A 07/01/2013    Procedure: BIOPSY;  Surgeon: Rogene Houston, MD;  Location: [facility];  Service: Endoscopy;  Laterality: N/A;   Past Medical History  Diagnosis Date  . Palpitations   . Tachycardia   . CAD (coronary artery disease)     Stent placement circumflex coronary 2007, catheterization 2008 patent stents. Normal LV  function  . Chest pain   . Diabetes mellitus     Insulin dependent  . Dyslipidemia   . Anxiety and depression   . Diabetic polyneuropathy      severe on multiple medications  . 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 MI (myocardial infarction)     2007  . Anxiety   . Depression   . Obstructive sleep apnea     CPAP machine at night  . COPD (chronic obstructive pulmonary disease)   . GERD (gastroesophageal reflux disease)   . CHF (congestive heart failure)   . Headache(784.0)   . Arthritis    There were no vitals taken for this visit.  Opioid Risk Score:   Fall Risk Score:  `1  Depression screen PHQ 2/9  Depression screen [facility] 2/9 11/13/2014 10/12/2014  Decreased Interest 3 3  Down, Depressed, Hopeless 3 3  PHQ - 2 Score 6 6  Altered sleeping - 2  Tired, decreased energy - 3  Change in appetite - 3  Feeling bad or failure about yourself  - 0  Trouble concentrating - 0  Moving slowly or fidgety/restless - 0  Suicidal thoughts - 0  PHQ-9 Score - 14     Review of Systems  Gastrointestinal: Positive for nausea, vomiting and diarrhea.  Genitourinary: Positive for difficulty urinating.  Musculoskeletal: Positive for gait problem.       Spasms  Neurological: Positive for dizziness and weakness.       Tingling  Psychiatric/Behavioral: Positive for dysphoric mood. The patient is nervous/anxious.   All other systems reviewed and are negative.      Objective:   Physical Exam  Constitutional: She is oriented to person, place, and time. She appears well-developed and well-nourished.  HENT:  Head: Normocephalic and atraumatic.  Eyes: Conjunctivae and EOM are normal. Pupils are equal, round, and reactive to light.  Neurological: She is alert and oriented to person, place, and time.  Psychiatric: She has a normal mood and affect.  Nursing note and vitals reviewed.   Tenderness over the right before meals joint. Negative impingement sign Low back is tenderness palpation she also has  pain with forward flexion and extension. Knees show no evidence of effusion. There is no evidence of erythema. There is tenderness superior pole of the patella on the left side only.      Assessment & Plan:   1. Chronic diabetic neuropathy she has had partial relief with medication management. This includes Opana ER 30 mg twice a day this is equivalent to 120 mg of oxycodone or 180 mg of morphine. She would like some type of breakthrough medicine. Reluctant to add additional C2 given her current dosage. We'll trial tramadol She has tried Lyrica in the past this gave her swelling. Does not want to revisit She gets some partial relief from gabapentin 800 mg 3 times a day which is at a maximum dose.  2. Right  shoulder pain Acromioclavicular joint set up for aUltrasound-guided injection   3. Back pain lumbar spondylosis, may benefit from medial branch blocks we'll address #2 first  #4. Patellofemoral arthritis mainly on the left. Continue Voltaren gel, recommend range of motion and strengthening. Consider injection in the future   2.

## 2014-11-30 ENCOUNTER — Other Ambulatory Visit: Payer: Self-pay | Admitting: Physical Medicine & Rehabilitation

## 2014-12-08 ENCOUNTER — Encounter (INDEPENDENT_AMBULATORY_CARE_PROVIDER_SITE_OTHER): Payer: Self-pay | Admitting: *Deleted

## 2014-12-11 ENCOUNTER — Ambulatory Visit (HOSPITAL_BASED_OUTPATIENT_CLINIC_OR_DEPARTMENT_OTHER): Payer: Medicaid Other | Admitting: Physical Medicine & Rehabilitation

## 2014-12-11 ENCOUNTER — Encounter: Payer: Medicaid Other | Attending: Physical Medicine & Rehabilitation

## 2014-12-11 ENCOUNTER — Encounter: Payer: Self-pay | Admitting: Physical Medicine & Rehabilitation

## 2014-12-11 DIAGNOSIS — G8929 Other chronic pain: Secondary | ICD-10-CM | POA: Diagnosis not present

## 2014-12-11 DIAGNOSIS — R079 Chest pain, unspecified: Secondary | ICD-10-CM | POA: Insufficient documentation

## 2014-12-11 DIAGNOSIS — Z794 Long term (current) use of insulin: Secondary | ICD-10-CM | POA: Diagnosis not present

## 2014-12-11 DIAGNOSIS — R000 Tachycardia, unspecified: Secondary | ICD-10-CM | POA: Diagnosis not present

## 2014-12-11 DIAGNOSIS — M79604 Pain in right leg: Secondary | ICD-10-CM | POA: Diagnosis not present

## 2014-12-11 DIAGNOSIS — I509 Heart failure, unspecified: Secondary | ICD-10-CM | POA: Diagnosis not present

## 2014-12-11 DIAGNOSIS — G4733 Obstructive sleep apnea (adult) (pediatric): Secondary | ICD-10-CM | POA: Diagnosis not present

## 2014-12-11 DIAGNOSIS — E1142 Type 2 diabetes mellitus with diabetic polyneuropathy: Secondary | ICD-10-CM

## 2014-12-11 DIAGNOSIS — M19011 Primary osteoarthritis, right shoulder: Secondary | ICD-10-CM

## 2014-12-11 DIAGNOSIS — R002 Palpitations: Secondary | ICD-10-CM | POA: Insufficient documentation

## 2014-12-11 DIAGNOSIS — K219 Gastro-esophageal reflux disease without esophagitis: Secondary | ICD-10-CM | POA: Diagnosis not present

## 2014-12-11 DIAGNOSIS — Z1401 Asymptomatic hemophilia A carrier: Secondary | ICD-10-CM | POA: Insufficient documentation

## 2014-12-11 DIAGNOSIS — M79605 Pain in left leg: Secondary | ICD-10-CM | POA: Insufficient documentation

## 2014-12-11 DIAGNOSIS — I252 Old myocardial infarction: Secondary | ICD-10-CM | POA: Insufficient documentation

## 2014-12-11 DIAGNOSIS — Z760 Encounter for issue of repeat prescription: Secondary | ICD-10-CM | POA: Insufficient documentation

## 2014-12-11 DIAGNOSIS — M4806 Spinal stenosis, lumbar region: Secondary | ICD-10-CM | POA: Diagnosis not present

## 2014-12-11 DIAGNOSIS — E785 Hyperlipidemia, unspecified: Secondary | ICD-10-CM | POA: Insufficient documentation

## 2014-12-11 DIAGNOSIS — M1990 Unspecified osteoarthritis, unspecified site: Secondary | ICD-10-CM | POA: Diagnosis not present

## 2014-12-11 DIAGNOSIS — F418 Other specified anxiety disorders: Secondary | ICD-10-CM | POA: Insufficient documentation

## 2014-12-11 DIAGNOSIS — M47896 Other spondylosis, lumbar region: Secondary | ICD-10-CM | POA: Diagnosis not present

## 2014-12-11 DIAGNOSIS — I2510 Atherosclerotic heart disease of native coronary artery without angina pectoris: Secondary | ICD-10-CM | POA: Diagnosis not present

## 2014-12-11 DIAGNOSIS — R51 Headache: Secondary | ICD-10-CM | POA: Insufficient documentation

## 2014-12-11 DIAGNOSIS — M47816 Spondylosis without myelopathy or radiculopathy, lumbar region: Secondary | ICD-10-CM

## 2014-12-11 DIAGNOSIS — G4700 Insomnia, unspecified: Secondary | ICD-10-CM | POA: Insufficient documentation

## 2014-12-11 DIAGNOSIS — J449 Chronic obstructive pulmonary disease, unspecified: Secondary | ICD-10-CM | POA: Diagnosis not present

## 2014-12-11 DIAGNOSIS — M5489 Other dorsalgia: Secondary | ICD-10-CM | POA: Diagnosis not present

## 2014-12-11 DIAGNOSIS — M48061 Spinal stenosis, lumbar region without neurogenic claudication: Secondary | ICD-10-CM

## 2014-12-11 MED ORDER — OXYMORPHONE HCL ER 30 MG PO TB12: 30.0000 mg | ORAL_TABLET | Freq: Two times a day (BID) | ORAL | Status: DC

## 2014-12-11 NOTE — Patient Instructions (Signed)
Please let [name] next month if you would like to schedule a knee injection.  You may try taking the tramadol 2 tablets once a day rather then 1 tablet twice a day

## 2014-12-11 NOTE — Progress Notes (Signed)
Subjective:    Patient ID: [name], female    DOB: [birth year], 64 y.o.   MRN: [id number]  HPI 64 year old female with chronic pain syndrome.Patient with lumbar stenosis as well as lumbar spondylosis and also has diabetic peripheral neuropathy. She's been on chronic long-acting narcotic analgesics. Oxymorphone 30 g twice a day Has asked for breakthrough medicine however given her age, comorbidities as well as current dosage we decided on tramadol 50 g twice a day when necessary. She states it really has not helped. She has never tried taking 2 at a time however Pain Inventory Average Pain 8 Pain Right Now 8 My pain is sharp, burning, stabbing, tingling and aching  In the last 24 hours, has pain interfered with the following? General activity 8 Relation with others 7 Enjoyment of life 8 What TIME of day is your pain at its worst? morning and night Sleep (in general) Poor  Pain is worse with: walking, bending, sitting, standing and some activites Pain improves with: heat/ice and medication Relief from Meds: 1  Mobility walk with assistance how many minutes can you walk? 1 ability to climb steps?  no do you drive?  no  Function disabled: date disabled .  Neuro/Psych bladder control problems weakness tingling spasms dizziness depression anxiety  Prior Studies Any changes since last visit?  no  Physicians involved in your care Any changes since last visit?  no   Family History  Problem Relation Age of Onset  . Cancer    . Coronary artery disease     Social History   Social History  . Marital Status: Legally Separated    Spouse Name: N/A  . Number of Children: N/A  . Years of Education: N/A   Occupational History  . Disabled    Social History Main Topics  . Smoking status: Never Smoker   . Smokeless tobacco: Never Used  . Alcohol Use: No  . Drug Use: No  . Sexual Activity: Not Asked   Other Topics Concern  . None   Social History Narrative     Patient does not get regular exercise   Past Surgical History  Procedure Laterality Date  . Coronary angioplasty with stent placement    . Colonoscopy  08/06/2011    Procedure: COLONOSCOPY;  Surgeon: Rogene Houston, MD;  [facility];  Service: Endoscopy;  Laterality: N/A;  215  . Arm surgery Bilateral     due to fall-pt broke both forearms  . Colonoscopy with propofol N/A 12/31/2012    Procedure: COLONOSCOPY WITH PROPOFOL;  Surgeon: Rogene Houston, MD;  Location: [facility];  Service: Endoscopy;  Laterality: N/A;  in cecum at 0814, total withdrawal time 56mn  . Esophagogastroduodenoscopy (egd) with propofol N/A 12/31/2012    Procedure: ESOPHAGOGASTRODUODENOSCOPY (EGD) WITH PROPOFOL;  Surgeon: NRogene Houston MD;  [facility];  Service: Endoscopy;  Laterality: N/A;  GE junction 37,  . [place] dilation N/A 12/31/2012    Procedure: MVenia MinksDILATION;  Surgeon: NRogene Houston MD;  [facility]: [facility];  Service: Endoscopy;  Laterality: N/A;  used a # 54,#56  . Cholecystectomy    . Abdominal hysterectomy    . Esophagogastroduodenoscopy (egd) with propofol N/A 07/01/2013    Procedure: ESOPHAGOGASTRODUODENOSCOPY (EGD) WITH PROPOFOL;  Surgeon: NRogene Houston MD;  [facility];  Service: Endoscopy;  Laterality: N/A;  950  . [place]oney dilation N/A 07/01/2013    Procedure: MVenia MinksDILATION;  Surgeon: NRogene Houston MD;  Location: [facility];  Service: Endoscopy;  Laterality: N/A;  54/58; no heme present  . Esophageal biopsy N/A 07/01/2013    Procedure: BIOPSY;  Surgeon: Rogene Houston, MD;  Location: [facility];  Service: Endoscopy;  Laterality: N/A;   Past Medical History  Diagnosis Date  . Palpitations   . Tachycardia   . CAD (coronary artery disease)     Stent placement circumflex coronary 2007, catheterization 2008 patent stents. Normal LV function  . Chest pain   . Diabetes mellitus     Insulin dependent  . Dyslipidemia   . Anxiety and depression   . Diabetic polyneuropathy       severe on multiple medications  . 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 MI (myocardial infarction)     2007  . Anxiety   . Depression   . Obstructive sleep apnea     CPAP machine at night  . COPD (chronic obstructive pulmonary disease)   . GERD (gastroesophageal reflux disease)   . CHF (congestive heart failure)   . Headache(784.0)   . Arthritis    There were no vitals taken for this visit.  Opioid Risk Score:   Fall Risk Score:  `1  Depression screen PHQ 2/9  Depression screen [facility] 2/9 12/11/2014 11/13/2014 10/12/2014  Decreased Interest 3 3 3   Down, Depressed, Hopeless 3 3 3   PHQ - 2 Score 6 6 6   Altered sleeping - - 2  Tired, decreased energy - - 3  Change in appetite - - 3  Feeling bad or failure about yourself  - - 0  Trouble concentrating - - 0  Moving slowly or fidgety/restless - - 0  Suicidal thoughts - - 0  PHQ-9 Score - - 14     Review of Systems  Constitutional: Positive for appetite change.  Gastrointestinal: Positive for nausea, vomiting, diarrhea and constipation.  Genitourinary:       Bladder control problems  Musculoskeletal: Positive for gait problem.       Spasms  Neurological: Positive for dizziness and weakness.  Psychiatric/Behavioral: Positive for dysphoric mood. The patient is nervous/anxious.   All other systems reviewed and are negative.      Objective:   Physical Exam  Constitutional: She is oriented to person, place, and time. She appears well-developed.  HENT:  Head: Normocephalic and atraumatic.  Eyes: Conjunctivae and EOM are normal. Pupils are equal, round, and reactive to light.  Musculoskeletal:       Right shoulder: She exhibits decreased range of motion and pain. She exhibits no deformity.       Right knee: She exhibits decreased range of motion. She exhibits no effusion and no deformity. No tenderness found.       Left knee: She exhibits decreased range of motion. She exhibits no ecchymosis and no deformity. Tenderness found.  Pain  with right shoulder adduction as well as abduction  Neurological: She is alert and oriented to person, place, and time. She has normal strength.  Psychiatric: Her affect is blunt. Her speech is delayed.  Nursing note and vitals reviewed.         Assessment & Plan:  1. Chronic pain syndrome multifactorial she has lumbar stenosis, lumbar spondylosis. She may be a candidate for medial branch blocks however she does not wish to pursue the option at this time  Continue oxymorphone 30 mg extended release twice a day Continue opioid monitoring program. This consists of regular clinic visits, examinations, urine drug screen, pill counts as well as use of [place] controlled  substance reporting System.  2. Left knee pain she has x-ray evidence of mild tricompartmental osteoarthritis. She may benefit from corticosteroid injection however she does not wish to have a shot, continue Voltaren gel

## 2014-12-29 ENCOUNTER — Ambulatory Visit: Payer: Medicaid Other | Admitting: Registered Nurse

## 2015-01-01 ENCOUNTER — Other Ambulatory Visit: Payer: Self-pay | Admitting: Cardiovascular Disease

## 2015-01-01 ENCOUNTER — Other Ambulatory Visit: Payer: Self-pay | Admitting: Registered Nurse

## 2015-01-04 ENCOUNTER — Other Ambulatory Visit (INDEPENDENT_AMBULATORY_CARE_PROVIDER_SITE_OTHER): Payer: Self-pay | Admitting: Internal Medicine

## 2015-01-05 ENCOUNTER — Ambulatory Visit: Payer: Medicaid Other | Admitting: Registered Nurse

## 2015-01-05 ENCOUNTER — Other Ambulatory Visit (INDEPENDENT_AMBULATORY_CARE_PROVIDER_SITE_OTHER): Payer: Self-pay | Admitting: Internal Medicine

## 2015-01-05 MED ORDER — DICYCLOMINE HCL 10 MG PO CAPS: ORAL_CAPSULE | ORAL | Status: DC

## 2015-01-12 ENCOUNTER — Encounter: Payer: Self-pay | Admitting: Registered Nurse

## 2015-01-12 ENCOUNTER — Other Ambulatory Visit: Payer: Self-pay | Admitting: Registered Nurse

## 2015-01-12 ENCOUNTER — Encounter: Payer: Medicaid Other | Attending: Physical Medicine & Rehabilitation | Admitting: Registered Nurse

## 2015-01-12 VITALS — BP 142/76 | HR 94

## 2015-01-12 DIAGNOSIS — Z1401 Asymptomatic hemophilia A carrier: Secondary | ICD-10-CM | POA: Insufficient documentation

## 2015-01-12 DIAGNOSIS — R000 Tachycardia, unspecified: Secondary | ICD-10-CM | POA: Insufficient documentation

## 2015-01-12 DIAGNOSIS — M47896 Other spondylosis, lumbar region: Secondary | ICD-10-CM | POA: Insufficient documentation

## 2015-01-12 DIAGNOSIS — G8929 Other chronic pain: Secondary | ICD-10-CM | POA: Insufficient documentation

## 2015-01-12 DIAGNOSIS — Z760 Encounter for issue of repeat prescription: Secondary | ICD-10-CM | POA: Insufficient documentation

## 2015-01-12 DIAGNOSIS — Z794 Long term (current) use of insulin: Secondary | ICD-10-CM | POA: Insufficient documentation

## 2015-01-12 DIAGNOSIS — M79604 Pain in right leg: Secondary | ICD-10-CM | POA: Diagnosis not present

## 2015-01-12 DIAGNOSIS — E785 Hyperlipidemia, unspecified: Secondary | ICD-10-CM | POA: Insufficient documentation

## 2015-01-12 DIAGNOSIS — M47816 Spondylosis without myelopathy or radiculopathy, lumbar region: Secondary | ICD-10-CM

## 2015-01-12 DIAGNOSIS — K219 Gastro-esophageal reflux disease without esophagitis: Secondary | ICD-10-CM | POA: Diagnosis not present

## 2015-01-12 DIAGNOSIS — E1142 Type 2 diabetes mellitus with diabetic polyneuropathy: Secondary | ICD-10-CM

## 2015-01-12 DIAGNOSIS — G894 Chronic pain syndrome: Secondary | ICD-10-CM

## 2015-01-12 DIAGNOSIS — R51 Headache: Secondary | ICD-10-CM | POA: Diagnosis not present

## 2015-01-12 DIAGNOSIS — G4700 Insomnia, unspecified: Secondary | ICD-10-CM | POA: Insufficient documentation

## 2015-01-12 DIAGNOSIS — M19011 Primary osteoarthritis, right shoulder: Secondary | ICD-10-CM

## 2015-01-12 DIAGNOSIS — G4733 Obstructive sleep apnea (adult) (pediatric): Secondary | ICD-10-CM | POA: Insufficient documentation

## 2015-01-12 DIAGNOSIS — F418 Other specified anxiety disorders: Secondary | ICD-10-CM | POA: Diagnosis not present

## 2015-01-12 DIAGNOSIS — I252 Old myocardial infarction: Secondary | ICD-10-CM | POA: Insufficient documentation

## 2015-01-12 DIAGNOSIS — I509 Heart failure, unspecified: Secondary | ICD-10-CM | POA: Diagnosis not present

## 2015-01-12 DIAGNOSIS — M5489 Other dorsalgia: Secondary | ICD-10-CM | POA: Diagnosis not present

## 2015-01-12 DIAGNOSIS — R002 Palpitations: Secondary | ICD-10-CM | POA: Diagnosis not present

## 2015-01-12 DIAGNOSIS — Z5181 Encounter for therapeutic drug level monitoring: Secondary | ICD-10-CM | POA: Diagnosis not present

## 2015-01-12 DIAGNOSIS — J449 Chronic obstructive pulmonary disease, unspecified: Secondary | ICD-10-CM | POA: Diagnosis not present

## 2015-01-12 DIAGNOSIS — M79605 Pain in left leg: Secondary | ICD-10-CM | POA: Insufficient documentation

## 2015-01-12 DIAGNOSIS — M1990 Unspecified osteoarthritis, unspecified site: Secondary | ICD-10-CM | POA: Insufficient documentation

## 2015-01-12 DIAGNOSIS — Z79899 Other long term (current) drug therapy: Secondary | ICD-10-CM

## 2015-01-12 DIAGNOSIS — R079 Chest pain, unspecified: Secondary | ICD-10-CM | POA: Diagnosis not present

## 2015-01-12 DIAGNOSIS — I2510 Atherosclerotic heart disease of native coronary artery without angina pectoris: Secondary | ICD-10-CM | POA: Insufficient documentation

## 2015-01-12 MED ORDER — TRAMADOL HCL 50 MG PO TABS: 50.0000 mg | ORAL_TABLET | Freq: Two times a day (BID) | ORAL | Status: DC

## 2015-01-12 MED ORDER — OXYMORPHONE HCL ER 30 MG PO TB12: 30.0000 mg | ORAL_TABLET | Freq: Two times a day (BID) | ORAL | Status: DC

## 2015-01-16 LAB — PMP ALCOHOL METABOLITE (ETG): Ethyl Glucuronide (EtG): NEGATIVE ng/mL

## 2015-01-16 NOTE — Progress Notes (Signed)
Subjective:    Patient ID: [name], female    DOB: [birth year], 64 y.o.   MRN: [id number]  HPI: [name] is a 64 year old female who returns for follow up for chronic pain and medication refill. She says her pain is located in her upper back and bilateral knees. Also states she has experienced occasional chest pain , she denies chest pain at this time. She refused ED evaluation and states she will follow up with her cardiologist.  She rates her pain 7. Her current exercise regime is chair exercises. And walking short distance with cadillac walker.  Pain Inventory Average Pain 8 Pain Right Now 7 My pain is sharp, burning, stabbing, tingling and aching  In the last 24 hours, has pain interfered with the following? General activity 9 Relation with others 8 Enjoyment of life 10 What TIME of day is your pain at its worst? Morning, Evening and Night Sleep (in general) Poor  Pain is worse with: walking, bending, sitting, inactivity, standing and some activites Pain improves with: rest, heat/ice and medication Relief from Meds: 1  Mobility walk with assistance use a walker how many minutes can you walk? 5 ability to climb steps?  no do you drive?  no Do you have any goals in this area?  yes  Function disabled: date disabled NA I need assistance with the following:  dressing, meal prep, household duties and shopping Do you have any goals in this area?  yes  Neuro/Psych bladder control problems weakness numbness tingling trouble walking dizziness depression anxiety  Prior Studies Any changes since last visit?  no  Physicians involved in your care Any changes since last visit?  no   Family History  Problem Relation Age of Onset  . Cancer    . Coronary artery disease     Social History   Social History  . Marital Status: Legally Separated    Spouse Name: N/A  . Number of Children: N/A  . Years of Education: N/A   Occupational History  .  Disabled    Social History Main Topics  . Smoking status: Never Smoker   . Smokeless tobacco: Never Used  . Alcohol Use: No  . Drug Use: No  . Sexual Activity: Not Asked   Other Topics Concern  . None   Social History Narrative   Patient does not get regular exercise   Past Surgical History  Procedure Laterality Date  . Coronary angioplasty with stent placement    . Colonoscopy  08/06/2011    Procedure: COLONOSCOPY;  Surgeon: Rogene Houston, MD;  [facility];  Service: Endoscopy;  Laterality: N/A;  215  . Arm surgery Bilateral     due to fall-pt broke both forearms  . Colonoscopy with propofol N/A 12/31/2012    Procedure: COLONOSCOPY WITH PROPOFOL;  Surgeon: Rogene Houston, MD;  Location: [facility];  Service: Endoscopy;  Laterality: N/A;  in cecum at 0814, total withdrawal time 60min  . Esophagogastroduodenoscopy (egd) with propofol N/A 12/31/2012    Procedure: ESOPHAGOGASTRODUODENOSCOPY (EGD) WITH PROPOFOL;  Surgeon: Rogene Houston, MD;  Location: [facility];  Service: Endoscopy;  Laterality: N/A;  GE junction 37,  . Maloney dilation N/A 12/31/2012    Procedure: Venia Minks DILATION;  Surgeon: Rogene Houston, MD;  Location: [facility];  Service: Endoscopy;  Laterality: N/A;  used a # 54,#56  . Cholecystectomy    . Abdominal hysterectomy    . Esophagogastroduodenoscopy (egd) with propofol N/A  07/01/2013    Procedure: ESOPHAGOGASTRODUODENOSCOPY (EGD) WITH PROPOFOL;  Surgeon: Rogene Houston, MD;  [facility]: [facility];  Service: Endoscopy;  Laterality: N/A;  950  . Maloney dilation N/A 07/01/2013    Procedure: Venia Minks DILATION;  Surgeon: Rogene Houston, MD;  [facility]: [facility];  Service: Endoscopy;  Laterality: N/A;  54/58; no heme present  . Esophageal biopsy N/A 07/01/2013    Procedure: BIOPSY;  Surgeon: Rogene Houston, MD;  Location: [facility];  Service: Endoscopy;  Laterality: N/A;   Past Medical History  Diagnosis Date  . Palpitations   . Tachycardia   . CAD (coronary artery  disease)     Stent placement circumflex coronary 2007, catheterization 2008 patent stents. Normal LV function  . Chest pain   . Diabetes mellitus     Insulin dependent  . Dyslipidemia   . Anxiety and depression   . Diabetic polyneuropathy (HCC)      severe on multiple medications  . 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 MI (myocardial infarction) ([place])     2007  . Anxiety   . Depression   . Obstructive sleep apnea     CPAP machine at night  . COPD (chronic obstructive pulmonary disease) ([place])   . GERD (gastroesophageal reflux disease)   . CHF (congestive heart failure) ([place])   . Headache(784.0)   . Arthritis    BP 142/76 mmHg  Pulse 94  SpO2 92%  Opioid Risk Score:   Fall Risk Score:  `1  Depression screen PHQ 2/9  Depression screen [facility] 2/9 12/11/2014 11/13/2014 10/12/2014  Decreased Interest 3 3 3   Down, Depressed, Hopeless 3 3 3   PHQ - 2 Score 6 6 6   Altered sleeping - - 2  Tired, decreased energy - - 3  Change in appetite - - 3  Feeling bad or failure about yourself  - - 0  Trouble concentrating - - 0  Moving slowly or fidgety/restless - - 0  Suicidal thoughts - - 0  PHQ-9 Score - - 14     Review of Systems  Constitutional: Positive for unexpected weight change.  Respiratory: Positive for shortness of breath.   Gastrointestinal: Positive for nausea, vomiting and diarrhea.  Endocrine:       High Blood Sugar  Genitourinary:       Bladder Control Problems  Neurological: Positive for dizziness, weakness and numbness.       Tingling Gait Instability  Psychiatric/Behavioral: The patient is nervous/anxious.        Depression  All other systems reviewed and are negative.      Objective:   Physical Exam  Constitutional: She is oriented to person, place, and time. She appears well-developed and well-nourished.  Neck: Normal range of motion. Neck supple.  Cardiovascular: Normal rate and regular rhythm.   Pulmonary/Chest: Effort normal and breath sounds normal.    Musculoskeletal:  Normal Muscle Bulk and Muscle Testing Reveals: Upper Extremities: Full ROM and Muscle Strength 5/5 Thoracic and Lumbar Hypersensitivity Lower Extremities: Full ROM and Muscle Strength 5/5 Arises from chair slowly using cadillac walker Antalgic Gait  Neurological: She is alert and oriented to person, place, and time.  Skin: Skin is warm and dry.  Psychiatric: She has a normal mood and affect.  Nursing note and vitals reviewed.         Assessment & Plan:  1.Lumbar pain lumbar spondylosis: Encouraged to increase Activity. X-ray Results Reviewed Refilled: Opana 30 mg one tablet every 12 hours as needed #60.  2.  Right Shoulder Pain: X-Ray Results Reviewed 4. Bilateral Knee Pain: X-ray Results Reviewed 5. Severe diabetic poly neuropathy: Continue to Monitor 6. Insomnia: On Trazodone PCP Following   30 minutes of face to face patient care time was spent during this visit. All questions were encouraged and answered.  F/U in 1 Month.

## 2015-01-19 LAB — OPIATES/OPIOIDS (LC/MS-MS)
Codeine Urine: NEGATIVE ng/mL (ref <50)
Hydrocodone: NEGATIVE ng/mL (ref <50)
Hydromorphone: NEGATIVE ng/mL (ref <50)
Morphine Urine: NEGATIVE ng/mL (ref <50)
Norhydrocodone, Ur: NEGATIVE ng/mL (ref <50)
Noroxycodone, Ur: NEGATIVE ng/mL (ref <50)
Oxycodone, ur: NEGATIVE ng/mL (ref <50)
Oxymorphone: 5087 ng/mL (ref <50)

## 2015-01-19 LAB — TRAMADOL, URINE
N-DESMETHYL-CIS-TRAMADOL: 1034 ng/mL (ref <100)
Tramadol, Urine: 1865 ng/mL (ref <100)

## 2015-01-19 LAB — BENZODIAZEPINES (GC/LC/MS), URINE
Alprazolam metabolite (GC/LC/MS), ur confirm: 315 ng/mL (ref <25)
Clonazepam metabolite (GC/LC/MS), ur confirm: NEGATIVE ng/mL (ref <25)
Flurazepam metabolite (GC/LC/MS), ur confirm: NEGATIVE ng/mL (ref <50)
Lorazepam (GC/LC/MS), ur confirm: NEGATIVE ng/mL (ref <50)
Midazolam (GC/LC/MS), ur confirm: NEGATIVE ng/mL (ref <50)
Nordiazepam (GC/LC/MS), ur confirm: NEGATIVE ng/mL (ref <50)
Oxazepam (GC/LC/MS), ur confirm: NEGATIVE ng/mL (ref <50)
Temazepam (GC/LC/MS), ur confirm: NEGATIVE ng/mL (ref <50)
Triazolam metabolite (GC/LC/MS), ur confirm: NEGATIVE ng/mL (ref <50)

## 2015-01-19 LAB — OXYCODONE, URINE (LC/MS-MS)
Noroxycodone, Ur: NEGATIVE ng/mL (ref <50)
Oxycodone, ur: NEGATIVE ng/mL (ref <50)
Oxymorphone: 5087 ng/mL (ref <50)

## 2015-01-20 LAB — PRESCRIPTION MONITORING PROFILE (SOLSTAS)
Amphetamine/Meth: NEGATIVE ng/mL
Barbiturate Screen, Urine: NEGATIVE ng/mL
Buprenorphine, Urine: NEGATIVE ng/mL
Cannabinoid Scrn, Ur: NEGATIVE ng/mL
Carisoprodol, Urine: NEGATIVE ng/mL
Cocaine Metabolites: NEGATIVE ng/mL
Creatinine, Urine: 92.13 mg/dL (ref >20.0)
Fentanyl, Ur: NEGATIVE ng/mL
MDMA URINE: NEGATIVE ng/mL
Meperidine, Ur: NEGATIVE ng/mL
Methadone Screen, Urine: NEGATIVE ng/mL
Nitrites, Initial: NEGATIVE ug/mL
Propoxyphene: NEGATIVE ng/mL
Tapentadol, urine: NEGATIVE ng/mL
Zolpidem, Urine: NEGATIVE ng/mL
pH, Initial: 6.8 pH (ref 4.5–8.9)

## 2015-01-29 NOTE — Progress Notes (Signed)
Urine drug screen for this encounter is consistent for prescribed medication 

## 2015-02-02 ENCOUNTER — Encounter: Payer: Self-pay | Admitting: Registered Nurse

## 2015-02-02 ENCOUNTER — Encounter: Payer: Medicaid Other | Attending: Physical Medicine & Rehabilitation | Admitting: Registered Nurse

## 2015-02-02 VITALS — BP 136/74 | HR 80 | Resp 14

## 2015-02-02 DIAGNOSIS — G4733 Obstructive sleep apnea (adult) (pediatric): Secondary | ICD-10-CM | POA: Diagnosis not present

## 2015-02-02 DIAGNOSIS — M5489 Other dorsalgia: Secondary | ICD-10-CM | POA: Diagnosis not present

## 2015-02-02 DIAGNOSIS — J449 Chronic obstructive pulmonary disease, unspecified: Secondary | ICD-10-CM | POA: Insufficient documentation

## 2015-02-02 DIAGNOSIS — M47816 Spondylosis without myelopathy or radiculopathy, lumbar region: Secondary | ICD-10-CM

## 2015-02-02 DIAGNOSIS — M79605 Pain in left leg: Secondary | ICD-10-CM | POA: Diagnosis not present

## 2015-02-02 DIAGNOSIS — E1142 Type 2 diabetes mellitus with diabetic polyneuropathy: Secondary | ICD-10-CM | POA: Insufficient documentation

## 2015-02-02 DIAGNOSIS — G894 Chronic pain syndrome: Secondary | ICD-10-CM

## 2015-02-02 DIAGNOSIS — R51 Headache: Secondary | ICD-10-CM | POA: Insufficient documentation

## 2015-02-02 DIAGNOSIS — M47896 Other spondylosis, lumbar region: Secondary | ICD-10-CM | POA: Insufficient documentation

## 2015-02-02 DIAGNOSIS — I509 Heart failure, unspecified: Secondary | ICD-10-CM | POA: Diagnosis not present

## 2015-02-02 DIAGNOSIS — Z760 Encounter for issue of repeat prescription: Secondary | ICD-10-CM | POA: Insufficient documentation

## 2015-02-02 DIAGNOSIS — R002 Palpitations: Secondary | ICD-10-CM | POA: Diagnosis not present

## 2015-02-02 DIAGNOSIS — Z1401 Asymptomatic hemophilia A carrier: Secondary | ICD-10-CM | POA: Insufficient documentation

## 2015-02-02 DIAGNOSIS — E785 Hyperlipidemia, unspecified: Secondary | ICD-10-CM | POA: Diagnosis not present

## 2015-02-02 DIAGNOSIS — G8929 Other chronic pain: Secondary | ICD-10-CM | POA: Insufficient documentation

## 2015-02-02 DIAGNOSIS — K219 Gastro-esophageal reflux disease without esophagitis: Secondary | ICD-10-CM | POA: Insufficient documentation

## 2015-02-02 DIAGNOSIS — F418 Other specified anxiety disorders: Secondary | ICD-10-CM | POA: Insufficient documentation

## 2015-02-02 DIAGNOSIS — M25562 Pain in left knee: Secondary | ICD-10-CM

## 2015-02-02 DIAGNOSIS — M25561 Pain in right knee: Secondary | ICD-10-CM | POA: Diagnosis not present

## 2015-02-02 DIAGNOSIS — M79604 Pain in right leg: Secondary | ICD-10-CM | POA: Insufficient documentation

## 2015-02-02 DIAGNOSIS — Z794 Long term (current) use of insulin: Secondary | ICD-10-CM | POA: Diagnosis not present

## 2015-02-02 DIAGNOSIS — M546 Pain in thoracic spine: Secondary | ICD-10-CM | POA: Diagnosis not present

## 2015-02-02 DIAGNOSIS — R000 Tachycardia, unspecified: Secondary | ICD-10-CM | POA: Diagnosis not present

## 2015-02-02 DIAGNOSIS — G4700 Insomnia, unspecified: Secondary | ICD-10-CM | POA: Insufficient documentation

## 2015-02-02 DIAGNOSIS — M1990 Unspecified osteoarthritis, unspecified site: Secondary | ICD-10-CM | POA: Diagnosis not present

## 2015-02-02 DIAGNOSIS — Z5181 Encounter for therapeutic drug level monitoring: Secondary | ICD-10-CM

## 2015-02-02 DIAGNOSIS — R079 Chest pain, unspecified: Secondary | ICD-10-CM | POA: Diagnosis not present

## 2015-02-02 DIAGNOSIS — I252 Old myocardial infarction: Secondary | ICD-10-CM | POA: Diagnosis not present

## 2015-02-02 DIAGNOSIS — Z79899 Other long term (current) drug therapy: Secondary | ICD-10-CM

## 2015-02-02 DIAGNOSIS — I2510 Atherosclerotic heart disease of native coronary artery without angina pectoris: Secondary | ICD-10-CM | POA: Diagnosis not present

## 2015-02-02 MED ORDER — TRAMADOL HCL 50 MG PO TABS: 50.0000 mg | ORAL_TABLET | Freq: Two times a day (BID) | ORAL | Status: DC

## 2015-02-02 MED ORDER — OXYMORPHONE HCL ER 30 MG PO TB12: 30.0000 mg | ORAL_TABLET | Freq: Two times a day (BID) | ORAL | Status: DC

## 2015-02-02 NOTE — Progress Notes (Signed)
Subjective:    Patient ID: [name], female    DOB: 08/19/1950, 64 y.o.   MRN: [id number] HPI: [name] is a 64 year old female who returns for follow up for chronic pain and medication refill. She says her pain is located in her bilateral shoulder's, mid- lower back, bilateral knees and lower extremities. She rates her pain 8. Her current exercise regime is chair exercises. And walking short distance with cadillac walker.  Pain Inventory Average Pain 8 Pain Right Now 8 My pain is constant, sharp, burning, stabbing, tingling and aching  In the last 24 hours, has pain interfered with the following? General activity 7 Relation with others 8 Enjoyment of life 8 What TIME of day is your pain at its worst? evening, night  Sleep (in general) Poor  Pain is worse with: walking, bending, standing and some activites Pain improves with: heat/ice and medication Relief from Meds: 2  Mobility walk with assistance use a cane use a walker how many minutes can you walk? 5 ability to climb steps?  no do you drive?  no Do you have any goals in this area?  yes  Function disabled: date disabled . I need assistance with the following:  meal prep, household duties and shopping Do you have any goals in this area?  yes  Neuro/Psych bladder control problems bowel control problems weakness tingling trouble walking spasms dizziness depression anxiety  Prior Studies Any changes since last visit?  no  Physicians involved in your care Any changes since last visit?  no   Family History  Problem Relation Age of Onset  . Cancer    . Coronary artery disease     Social History   Social History  . Marital Status: Legally Separated    Spouse Name: N/A  . Number of Children: N/A  . Years of Education: N/A   Occupational History  . Disabled    Social History Main Topics  . Smoking status: Never Smoker   . Smokeless tobacco: Never Used  . Alcohol Use: No  . Drug  Use: No  . Sexual Activity: Not Asked   Other Topics Concern  . None   Social History Narrative   Patient does not get regular exercise   Past Surgical History  Procedure Laterality Date  . Coronary angioplasty with stent placement    . Colonoscopy  08/06/2011    Procedure: COLONOSCOPY;  Surgeon: Rogene Houston, MD;  [facility];  Service: Endoscopy;  Laterality: N/A;  215  . Arm surgery Bilateral     due to fall-pt broke both forearms  . Colonoscopy with propofol N/A 12/31/2012    Procedure: COLONOSCOPY WITH PROPOFOL;  Surgeon: Rogene Houston, MD;  [facility];  Service: Endoscopy;  Laterality: N/A;  in cecum at 0814, total withdrawal time 27mn  . Esophagogastroduodenoscopy (egd) with propofol N/A 12/31/2012    Procedure: ESOPHAGOGASTRODUODENOSCOPY (EGD) WITH PROPOFOL;  Surgeon: NRogene Houston MD;  [facility];  Service: Endoscopy;  Laterality: N/A;  GE junction 37,  . Maloney dilation N/A 12/31/2012    Procedure: MVenia MinksDILATION;  Surgeon: NRogene Houston MD;  [facility];  Service: Endoscopy;  Laterality: N/A;  used a # 54,#56  . Cholecystectomy    . Abdominal hysterectomy    . Esophagogastroduodenoscopy (egd) with propofol N/A 07/01/2013    Procedure: ESOPHAGOGASTRODUODENOSCOPY (EGD) WITH PROPOFOL;  Surgeon: NRogene Houston MD;  [facility];  Service: Endoscopy;  Laterality:  N/A;  950  . Maloney dilation N/A 07/01/2013    Procedure: Venia Minks DILATION;  Surgeon: Rogene Houston, MD;  [facility];  Service: Endoscopy;  Laterality: N/A;  54/58; no heme present  . Esophageal biopsy N/A 07/01/2013    Procedure: BIOPSY;  Surgeon: Rogene Houston, MD;  Location: [facility];  Service: Endoscopy;  Laterality: N/A;   Past Medical History  Diagnosis Date  . Palpitations   . Tachycardia   . CAD (coronary artery disease)     Stent placement circumflex coronary 2007, catheterization 2008 patent stents. Normal LV function  . Chest pain   . Diabetes  mellitus     Insulin dependent  . Dyslipidemia   . Anxiety and depression   . Diabetic polyneuropathy (HCC)      severe on multiple medications  . 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 MI (myocardial infarction) ([place])     2007  . Anxiety   . Depression   . Obstructive sleep apnea     CPAP machine at night  . COPD (chronic obstructive pulmonary disease) ([place])   . GERD (gastroesophageal reflux disease)   . CHF (congestive heart failure) ([place])   . Headache(784.0)   . Arthritis    BP 136/74 mmHg  Pulse 80  Resp 14  SpO2 94%  Opioid Risk Score:   Fall Risk Score:  `1  Depression screen PHQ 2/9  Depression screen [facility] 2/9 12/11/2014 11/13/2014 10/12/2014  Decreased Interest 3 3 3   Down, Depressed, Hopeless 3 3 3   PHQ - 2 Score 6 6 6   Altered sleeping - - 2  Tired, decreased energy - - 3  Change in appetite - - 3  Feeling bad or failure about yourself  - - 0  Trouble concentrating - - 0  Moving slowly or fidgety/restless - - 0  Suicidal thoughts - - 0  PHQ-9 Score - - 14     Review of Systems  Gastrointestinal: Positive for diarrhea.  Genitourinary: Positive for urgency.       Urgency Incontinence   Musculoskeletal: Positive for gait problem.       Spasms  Neurological: Positive for dizziness and weakness.       Tingling   Psychiatric/Behavioral: Positive for dysphoric mood. The patient is nervous/anxious.   All other systems reviewed and are negative.      Objective:   Physical Exam  Constitutional: She is oriented to person, place, and time. She appears well-developed and well-nourished.  HENT:  Head: Normocephalic and atraumatic.  Neck: Normal range of motion. Neck supple.  Cardiovascular: Normal rate and regular rhythm.   Pulmonary/Chest: Effort normal and breath sounds normal.  Musculoskeletal: She exhibits edema.  Normal Muscle Bulk and Muscle Testing Reveals: Upper Extremities: Full ROM and Muscle Strength 5/5 Thoracic and Lumbar Hypersensitivity Lower  Extremities: Full ROM and Muscle Strength 5/5 Bilateral Lower Extremities Flexion Produces Pain into Lumbar and Bilateral Hips Arises from chair slowly using cadillac walker for support Antalgic Gait   Neurological: She is alert and oriented to person, place, and time.  Skin: Skin is warm and dry.  Psychiatric: She has a normal mood and affect.  Nursing note and vitals reviewed.         Assessment & Plan:  1.Lumbar pain lumbar spondylosis: Encouraged to increase Activity as tolerated and continue HEP. Refilled: Opana 30 mg one tablet every 12 hours as needed #60.  2.. Bilateral Knee Pain/ Degenerative: Continue Voltaren Gel. Continue to Monitor 5. Severe diabetic poly  neuropathy: Continue to Monitor 6. Insomnia: On Trazodone PCP Following   20 minutes of face to face patient care time was spent during this visit. All questions were encouraged and answered.   F/U in 1 Month.

## 2015-03-02 ENCOUNTER — Encounter: Payer: Self-pay | Admitting: Registered Nurse

## 2015-03-02 ENCOUNTER — Encounter: Payer: Medicaid Other | Attending: Physical Medicine & Rehabilitation | Admitting: Registered Nurse

## 2015-03-02 VITALS — BP 151/67 | HR 87

## 2015-03-02 DIAGNOSIS — E785 Hyperlipidemia, unspecified: Secondary | ICD-10-CM | POA: Diagnosis not present

## 2015-03-02 DIAGNOSIS — Z760 Encounter for issue of repeat prescription: Secondary | ICD-10-CM | POA: Insufficient documentation

## 2015-03-02 DIAGNOSIS — M5489 Other dorsalgia: Secondary | ICD-10-CM | POA: Diagnosis not present

## 2015-03-02 DIAGNOSIS — Z79899 Other long term (current) drug therapy: Secondary | ICD-10-CM

## 2015-03-02 DIAGNOSIS — I2510 Atherosclerotic heart disease of native coronary artery without angina pectoris: Secondary | ICD-10-CM | POA: Diagnosis not present

## 2015-03-02 DIAGNOSIS — Z794 Long term (current) use of insulin: Secondary | ICD-10-CM | POA: Insufficient documentation

## 2015-03-02 DIAGNOSIS — F418 Other specified anxiety disorders: Secondary | ICD-10-CM | POA: Diagnosis not present

## 2015-03-02 DIAGNOSIS — M25562 Pain in left knee: Secondary | ICD-10-CM

## 2015-03-02 DIAGNOSIS — M79604 Pain in right leg: Secondary | ICD-10-CM | POA: Diagnosis not present

## 2015-03-02 DIAGNOSIS — R51 Headache: Secondary | ICD-10-CM | POA: Insufficient documentation

## 2015-03-02 DIAGNOSIS — R000 Tachycardia, unspecified: Secondary | ICD-10-CM | POA: Insufficient documentation

## 2015-03-02 DIAGNOSIS — E1142 Type 2 diabetes mellitus with diabetic polyneuropathy: Secondary | ICD-10-CM | POA: Diagnosis not present

## 2015-03-02 DIAGNOSIS — M25561 Pain in right knee: Secondary | ICD-10-CM

## 2015-03-02 DIAGNOSIS — M1990 Unspecified osteoarthritis, unspecified site: Secondary | ICD-10-CM | POA: Diagnosis not present

## 2015-03-02 DIAGNOSIS — R079 Chest pain, unspecified: Secondary | ICD-10-CM | POA: Diagnosis not present

## 2015-03-02 DIAGNOSIS — M546 Pain in thoracic spine: Secondary | ICD-10-CM

## 2015-03-02 DIAGNOSIS — G8929 Other chronic pain: Secondary | ICD-10-CM | POA: Diagnosis not present

## 2015-03-02 DIAGNOSIS — M79605 Pain in left leg: Secondary | ICD-10-CM | POA: Diagnosis not present

## 2015-03-02 DIAGNOSIS — M47816 Spondylosis without myelopathy or radiculopathy, lumbar region: Secondary | ICD-10-CM

## 2015-03-02 DIAGNOSIS — G4733 Obstructive sleep apnea (adult) (pediatric): Secondary | ICD-10-CM | POA: Insufficient documentation

## 2015-03-02 DIAGNOSIS — G4700 Insomnia, unspecified: Secondary | ICD-10-CM | POA: Insufficient documentation

## 2015-03-02 DIAGNOSIS — K219 Gastro-esophageal reflux disease without esophagitis: Secondary | ICD-10-CM | POA: Diagnosis not present

## 2015-03-02 DIAGNOSIS — G894 Chronic pain syndrome: Secondary | ICD-10-CM

## 2015-03-02 DIAGNOSIS — R002 Palpitations: Secondary | ICD-10-CM | POA: Insufficient documentation

## 2015-03-02 DIAGNOSIS — Z1401 Asymptomatic hemophilia A carrier: Secondary | ICD-10-CM | POA: Diagnosis not present

## 2015-03-02 DIAGNOSIS — I252 Old myocardial infarction: Secondary | ICD-10-CM | POA: Insufficient documentation

## 2015-03-02 DIAGNOSIS — M47896 Other spondylosis, lumbar region: Secondary | ICD-10-CM | POA: Diagnosis not present

## 2015-03-02 DIAGNOSIS — I509 Heart failure, unspecified: Secondary | ICD-10-CM | POA: Insufficient documentation

## 2015-03-02 DIAGNOSIS — J449 Chronic obstructive pulmonary disease, unspecified: Secondary | ICD-10-CM | POA: Diagnosis not present

## 2015-03-02 DIAGNOSIS — Z5181 Encounter for therapeutic drug level monitoring: Secondary | ICD-10-CM

## 2015-03-02 MED ORDER — OXYMORPHONE HCL ER 30 MG PO TB12: 30.0000 mg | ORAL_TABLET | Freq: Two times a day (BID) | ORAL | Status: DC

## 2015-03-02 NOTE — Progress Notes (Signed)
Subjective:    Patient ID: [name], female    DOB: [birth year], 64 y.o.   MRN: [id number]  HPI: [name] is a 64 year old female who returns for follow up for chronic pain and medication refill. She says her pain is located in her neck, bilateral shoulder's, entire back radiating into lower extremities posteriorly, bilateral knees left greater than right and bilateral feet. She rates her pain 9. Her current exercise regime is chair exercises and walking short distances.  Pain Inventory Average Pain 8 Pain Right Now 9 My pain is sharp, burning, stabbing, tingling and aching  In the last 24 hours, has pain interfered with the following? General activity 8 Relation with others 7 Enjoyment of life 8 What TIME of day is your pain at its worst? evening and night Sleep (in general) Poor  Pain is worse with: walking, bending and standing Pain improves with: medication Relief from Meds: 2  Mobility walk with assistance use a walker ability to climb steps?  no do you drive?  no  Function retired I need assistance with the following:  household duties and shopping  Neuro/Psych bladder control problems bowel control problems weakness depression anxiety loss of taste or smell  Prior Studies Any changes since last visit?  no  Physicians involved in your care Any changes since last visit?  no   Family History  Problem Relation Age of Onset  . Cancer    . Coronary artery disease     Social History   Social History  . Marital Status: Legally Separated    Spouse Name: N/A  . Number of Children: N/A  . Years of Education: N/A   Occupational History  . Disabled    Social History Main Topics  . Smoking status: Never Smoker   . Smokeless tobacco: Never Used  . Alcohol Use: No  . Drug Use: No  . Sexual Activity: Not Asked   Other Topics Concern  . None   Social History Narrative   Patient does not get regular exercise   Past Surgical History    Procedure Laterality Date  . Coronary angioplasty with stent placement    . Colonoscopy  08/06/2011    Procedure: COLONOSCOPY;  Surgeon: Rogene Houston, MD;  [facility];  Service: Endoscopy;  Laterality: N/A;  215  . Arm surgery Bilateral     due to fall-pt broke both forearms  . Colonoscopy with propofol N/A 12/31/2012    Procedure: COLONOSCOPY WITH PROPOFOL;  Surgeon: Rogene Houston, MD;  [facility]: [facility];  Service: Endoscopy;  Laterality: N/A;  in cecum at 0814, total withdrawal time 44mn  . Esophagogastroduodenoscopy (egd) with propofol N/A 12/31/2012    Procedure: ESOPHAGOGASTRODUODENOSCOPY (EGD) WITH PROPOFOL;  Surgeon: NRogene Houston MD;  [facility];  Service: Endoscopy;  Laterality: N/A;  GE junction 37,  . Maloney dilation N/A 12/31/2012    Procedure: MVenia MinksDILATION;  Surgeon: NRogene Houston MD;  [facility];  Service: Endoscopy;  Laterality: N/A;  used a # 54,#56  . Cholecystectomy    . Abdominal hysterectomy    . Esophagogastroduodenoscopy (egd) with propofol N/A 07/01/2013    Procedure: ESOPHAGOGASTRODUODENOSCOPY (EGD) WITH PROPOFOL;  Surgeon: NRogene Houston MD;  [facility]: [facility];  Service: Endoscopy;  Laterality: N/A;  950  . Maloney dilation N/A 07/01/2013    Procedure: MVenia MinksDILATION;  Surgeon: NRogene Houston MD;  [facility]: [facility];  Service: Endoscopy;  Laterality: N/A;  54/58; no heme present  . Esophageal biopsy N/A 07/01/2013    Procedure: BIOPSY;  Surgeon: Rogene Houston, MD;  Location: [facility];  Service: Endoscopy;  Laterality: N/A;   Past Medical History  Diagnosis Date  . Palpitations   . Tachycardia   . CAD (coronary artery disease)     Stent placement circumflex coronary 2007, catheterization 2008 patent stents. Normal LV function  . Chest pain   . Diabetes mellitus     Insulin dependent  . Dyslipidemia   . Anxiety and depression   . Diabetic polyneuropathy (HCC)      severe on multiple medications  . 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 MI (myocardial infarction) ([place])     2007  . Anxiety   . Depression   . Obstructive sleep apnea     CPAP machine at night  . COPD (chronic obstructive pulmonary disease) ([place])   . GERD (gastroesophageal reflux disease)   . CHF (congestive heart failure) ([place])   . Headache(784.0)   . Arthritis    BP 151/67 mmHg  Pulse 87  SpO2 95%  Opioid Risk Score:   Fall Risk Score:  `1  Depression screen PHQ 2/9  Depression screen [facility] 2/9 12/11/2014 11/13/2014 10/12/2014  Decreased Interest 3 3 3   Down, Depressed, Hopeless 3 3 3   PHQ - 2 Score 6 6 6   Altered sleeping - - 2  Tired, decreased energy - - 3  Change in appetite - - 3  Feeling bad or failure about yourself  - - 0  Trouble concentrating - - 0  Moving slowly or fidgety/restless - - 0  Suicidal thoughts - - 0  PHQ-9 Score - - 14     Review of Systems  Constitutional: Positive for unexpected weight change.  Gastrointestinal: Positive for nausea.       Bowel Control Problems  Genitourinary:       Bladder control problems  Psychiatric/Behavioral: Positive for dysphoric mood. The patient is nervous/anxious.   All other systems reviewed and are negative.      Objective:   Physical Exam  Constitutional: She is oriented to person, place, and time. She appears well-developed and well-nourished.  HENT:  Head: Normocephalic and atraumatic.  Neck: Normal range of motion. Neck supple.  Cervical Paraspinal Tenderness: C-5- C-6  Cardiovascular: Normal rate and regular rhythm.   Pulmonary/Chest: Effort normal and breath sounds normal.  Musculoskeletal:  Normal Muscle Bulk and Muscle Testing Reveals: Upper Extremities: Full ROM and Muscle Strength 5/5 Thoracic and Lumbar Hypersensitivity Lower Extremities: Decreased ROM and Muscle Strength 5/5 Right Lower Extremity Flexion Produces Pain into Lumbar and Lower Extremity Left Lower Extremity Flexion Produces pain into Patella Arises from chair slowly Antalgic Gait Pitting  Edema 3+   Neurological: She is alert and oriented to person, place, and time.  Skin: Skin is warm and dry.  Psychiatric: She has a normal mood and affect.  Nursing note and vitals reviewed.         Assessment & Plan:  1.Lumbar pain lumbar spondylosis: Encouraged to increase Activity as tolerated and continue HEP. Refilled: Opana 30 mg one tablet every 12 hours as needed #60.  2.. Bilateral Knee Pain/ Degenerative: Continue Voltaren Gel. Continue to Monitor 5. Severe diabetic poly neuropathy: Continue to Monitor 6. Insomnia: On Trazodone PCP Following   20 minutes of face to face patient care time was spent during this visit. All questions were encouraged and answered.   F/U in 1 Month.

## 2015-03-22 LAB — HEMOGLOBIN A1C: Hemoglobin A1C: 8.6

## 2015-03-30 ENCOUNTER — Other Ambulatory Visit: Payer: Self-pay | Admitting: Physical Medicine & Rehabilitation

## 2015-04-02 NOTE — Telephone Encounter (Signed)
Is this okay to refill? I do not see anything in your notes in regards to continuing this medication. Please advise, thank you.

## 2015-04-04 ENCOUNTER — Telehealth (INDEPENDENT_AMBULATORY_CARE_PROVIDER_SITE_OTHER): Payer: Self-pay | Admitting: Internal Medicine

## 2015-04-04 ENCOUNTER — Ambulatory Visit (INDEPENDENT_AMBULATORY_CARE_PROVIDER_SITE_OTHER): Payer: Medicaid Other | Admitting: Internal Medicine

## 2015-04-04 NOTE — Telephone Encounter (Signed)
[name] came in thinking she had an appt today. Per [facility], the appt was cancelled by the automated system. She's rescheduled for February 7th, 2017. She can only have a 3:30pm appt due to transportation but she's wondering if she can be worked in sooner than Feb. 7th. She'd like a phone call concerning this if possible.  Pt's ph# [phone] Thank you.

## 2015-04-04 NOTE — Telephone Encounter (Signed)
noted 

## 2015-04-04 NOTE — Telephone Encounter (Signed)
Please let patient know we could probably get her in earlier but it may not be a 330 appt

## 2015-04-04 NOTE — Telephone Encounter (Signed)
I spoke to [name] and she said 3:30pm is the best time for her to schedule an appointment. She added that her transportation doesn't get off of work until 3:30pm so she probably wouldn't be able to arrive until 4:00pm.  Thank you.

## 2015-04-04 NOTE — Telephone Encounter (Signed)
Please call patient and let her know we will do our best to get her in earlier.

## 2015-04-06 ENCOUNTER — Encounter: Payer: Medicaid Other | Attending: Physical Medicine & Rehabilitation | Admitting: Registered Nurse

## 2015-04-06 ENCOUNTER — Encounter: Payer: Self-pay | Admitting: Registered Nurse

## 2015-04-06 VITALS — BP 146/68 | HR 83 | Resp 14

## 2015-04-06 DIAGNOSIS — G4733 Obstructive sleep apnea (adult) (pediatric): Secondary | ICD-10-CM | POA: Diagnosis not present

## 2015-04-06 DIAGNOSIS — R079 Chest pain, unspecified: Secondary | ICD-10-CM | POA: Diagnosis not present

## 2015-04-06 DIAGNOSIS — F418 Other specified anxiety disorders: Secondary | ICD-10-CM | POA: Insufficient documentation

## 2015-04-06 DIAGNOSIS — R000 Tachycardia, unspecified: Secondary | ICD-10-CM | POA: Diagnosis not present

## 2015-04-06 DIAGNOSIS — M47896 Other spondylosis, lumbar region: Secondary | ICD-10-CM | POA: Insufficient documentation

## 2015-04-06 DIAGNOSIS — M7541 Impingement syndrome of right shoulder: Secondary | ICD-10-CM

## 2015-04-06 DIAGNOSIS — J449 Chronic obstructive pulmonary disease, unspecified: Secondary | ICD-10-CM | POA: Diagnosis not present

## 2015-04-06 DIAGNOSIS — Z5181 Encounter for therapeutic drug level monitoring: Secondary | ICD-10-CM

## 2015-04-06 DIAGNOSIS — E785 Hyperlipidemia, unspecified: Secondary | ICD-10-CM | POA: Insufficient documentation

## 2015-04-06 DIAGNOSIS — M1990 Unspecified osteoarthritis, unspecified site: Secondary | ICD-10-CM | POA: Diagnosis not present

## 2015-04-06 DIAGNOSIS — E1142 Type 2 diabetes mellitus with diabetic polyneuropathy: Secondary | ICD-10-CM | POA: Diagnosis not present

## 2015-04-06 DIAGNOSIS — G8929 Other chronic pain: Secondary | ICD-10-CM | POA: Diagnosis not present

## 2015-04-06 DIAGNOSIS — I2510 Atherosclerotic heart disease of native coronary artery without angina pectoris: Secondary | ICD-10-CM | POA: Diagnosis not present

## 2015-04-06 DIAGNOSIS — G894 Chronic pain syndrome: Secondary | ICD-10-CM

## 2015-04-06 DIAGNOSIS — M25562 Pain in left knee: Secondary | ICD-10-CM

## 2015-04-06 DIAGNOSIS — M79604 Pain in right leg: Secondary | ICD-10-CM | POA: Insufficient documentation

## 2015-04-06 DIAGNOSIS — Z1401 Asymptomatic hemophilia A carrier: Secondary | ICD-10-CM | POA: Diagnosis not present

## 2015-04-06 DIAGNOSIS — K219 Gastro-esophageal reflux disease without esophagitis: Secondary | ICD-10-CM | POA: Insufficient documentation

## 2015-04-06 DIAGNOSIS — M25561 Pain in right knee: Secondary | ICD-10-CM | POA: Diagnosis not present

## 2015-04-06 DIAGNOSIS — M546 Pain in thoracic spine: Secondary | ICD-10-CM | POA: Diagnosis not present

## 2015-04-06 DIAGNOSIS — G4700 Insomnia, unspecified: Secondary | ICD-10-CM | POA: Insufficient documentation

## 2015-04-06 DIAGNOSIS — Z760 Encounter for issue of repeat prescription: Secondary | ICD-10-CM | POA: Insufficient documentation

## 2015-04-06 DIAGNOSIS — I509 Heart failure, unspecified: Secondary | ICD-10-CM | POA: Diagnosis not present

## 2015-04-06 DIAGNOSIS — R51 Headache: Secondary | ICD-10-CM | POA: Diagnosis not present

## 2015-04-06 DIAGNOSIS — Z794 Long term (current) use of insulin: Secondary | ICD-10-CM | POA: Insufficient documentation

## 2015-04-06 DIAGNOSIS — M5489 Other dorsalgia: Secondary | ICD-10-CM | POA: Diagnosis not present

## 2015-04-06 DIAGNOSIS — I252 Old myocardial infarction: Secondary | ICD-10-CM | POA: Diagnosis not present

## 2015-04-06 DIAGNOSIS — R002 Palpitations: Secondary | ICD-10-CM | POA: Diagnosis not present

## 2015-04-06 DIAGNOSIS — M47816 Spondylosis without myelopathy or radiculopathy, lumbar region: Secondary | ICD-10-CM

## 2015-04-06 DIAGNOSIS — Z79899 Other long term (current) drug therapy: Secondary | ICD-10-CM

## 2015-04-06 DIAGNOSIS — M79605 Pain in left leg: Secondary | ICD-10-CM | POA: Insufficient documentation

## 2015-04-06 MED ORDER — OXYMORPHONE HCL ER 30 MG PO TB12: 30.0000 mg | ORAL_TABLET | Freq: Two times a day (BID) | ORAL | Status: DC

## 2015-04-06 MED ORDER — TRAMADOL HCL 50 MG PO TABS: 50.0000 mg | ORAL_TABLET | Freq: Two times a day (BID) | ORAL | Status: DC

## 2015-04-06 NOTE — Progress Notes (Signed)
Subjective:    Patient ID: [name], female    DOB: [birth year], 65 y.o.   MRN: [id number]  HPI: [name] is a 65 year old female who returns for follow up for chronic pain and medication refill. She says her pain is located in her right shoulder and entire back. Also states she's having generalized pain all over. She rates her pain 9. Her current exercise regime is chair exercises and walking short distances. She refuses steroids due to an elevated HGBA1C she states.  Pain Inventory Average Pain 9 Pain Right Now 9 My pain is sharp, burning, stabbing, tingling and aching  In the last 24 hours, has pain interfered with the following? General activity 8 Relation with others 9 Enjoyment of life 10 What TIME of day is your pain at its worst? morning, night Sleep (in general) Fair  Pain is worse with: walking, bending, standing and some activites Pain improves with: rest Relief from Meds: 2  Mobility walk with assistance use a walker how many minutes can you walk? 5 ability to climb steps?  no do you drive?  no needs help with transfers Do you have any goals in this area?  yes  Function I need assistance with the following:  dressing, bathing, meal prep, household duties and shopping  Neuro/Psych bladder control problems bowel control problems weakness numbness tingling trouble walking dizziness depression anxiety loss of taste or smell  Prior Studies Any changes since last visit?  no  Physicians involved in your care Any changes since last visit?  no   Family History  Problem Relation Age of Onset  . Cancer    . Coronary artery disease     Social History   Social History  . Marital Status: Legally Separated    Spouse Name: N/A  . Number of Children: N/A  . Years of Education: N/A   Occupational History  . Disabled    Social History Main Topics  . Smoking status: Never Smoker   . Smokeless tobacco: Never Used  . Alcohol Use: No  .  Drug Use: No  . Sexual Activity: Not Asked   Other Topics Concern  . None   Social History Narrative   Patient does not get regular exercise   Past Surgical History  Procedure Laterality Date  . Coronary angioplasty with stent placement    . Colonoscopy  08/06/2011    Procedure: COLONOSCOPY;  Surgeon: Rogene Houston, MD;  [facility];  Service: Endoscopy;  Laterality: N/A;  215  . Arm surgery Bilateral     due to fall-pt broke both forearms  . Colonoscopy with propofol N/A 12/31/2012    Procedure: COLONOSCOPY WITH PROPOFOL;  Surgeon: Rogene Houston, MD;  Location: [facility];  Service: Endoscopy;  Laterality: N/A;  in cecum at 0814, total withdrawal time 63mn  . Esophagogastroduodenoscopy (egd) with propofol N/A 12/31/2012    Procedure: ESOPHAGOGASTRODUODENOSCOPY (EGD) WITH PROPOFOL;  Surgeon: NRogene Houston MD;  Location: [facility];  Service: Endoscopy;  Laterality: N/A;  GE junction 37,  . Maloney dilation N/A 12/31/2012    Procedure: MVenia MinksDILATION;  Surgeon: NRogene Houston MD;  Location: [facility];  Service: Endoscopy;  Laterality: N/A;  used a # 54,#56  . Cholecystectomy    . Abdominal hysterectomy    . Esophagogastroduodenoscopy (egd) with propofol N/A 07/01/2013    Procedure: ESOPHAGOGASTRODUODENOSCOPY (EGD) WITH PROPOFOL;  Surgeon: NRogene Houston MD;  Location: [facility];  Service: Endoscopy;  Laterality: N/A;  950  . Maloney dilation N/A 07/01/2013    Procedure: Venia Minks DILATION;  Surgeon: Rogene Houston, MD;  Location: [facility];  Service: Endoscopy;  Laterality: N/A;  54/58; no heme present  . Esophageal biopsy N/A 07/01/2013    Procedure: BIOPSY;  Surgeon: Rogene Houston, MD;  Location: [facility];  Service: Endoscopy;  Laterality: N/A;   Past Medical History  Diagnosis Date  . Palpitations   . Tachycardia   . CAD (coronary artery disease)     Stent placement circumflex coronary 2007, catheterization 2008 patent stents. Normal LV function  . Chest pain   . Diabetes  mellitus     Insulin dependent  . Dyslipidemia   . Anxiety and depression   . Diabetic polyneuropathy (HCC)      severe on multiple medications  . 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 MI (myocardial infarction) ([place])     2007  . Anxiety   . Depression   . Obstructive sleep apnea     CPAP machine at night  . COPD (chronic obstructive pulmonary disease) ([place])   . GERD (gastroesophageal reflux disease)   . CHF (congestive heart failure) ([place])   . Headache(784.0)   . Arthritis    BP 146/68 mmHg  Pulse 83  Resp 14  SpO2 95%  Opioid Risk Score:   Fall Risk Score:  `1  Depression screen PHQ 2/9  Depression screen [facility] 2/9 12/11/2014 11/13/2014 10/12/2014  Decreased Interest 3 3 3   Down, Depressed, Hopeless 3 3 3   PHQ - 2 Score 6 6 6   Altered sleeping - - 2  Tired, decreased energy - - 3  Change in appetite - - 3  Feeling bad or failure about yourself  - - 0  Trouble concentrating - - 0  Moving slowly or fidgety/restless - - 0  Suicidal thoughts - - 0  PHQ-9 Score - - 14      Review of Systems  Constitutional: Positive for appetite change and unexpected weight change.       Bladder control problems Bowel control problems Loss of taste or smell  Respiratory: Positive for shortness of breath.   Cardiovascular:       Limb swelling  Gastrointestinal: Positive for nausea, abdominal pain, diarrhea and constipation.  Genitourinary: Positive for difficulty urinating.  Musculoskeletal: Positive for gait problem.  Neurological: Positive for dizziness, weakness and numbness.       Tingling  Psychiatric/Behavioral: Positive for dysphoric mood. The patient is nervous/anxious.   All other systems reviewed and are negative.      Objective:   Physical Exam  Constitutional: She is oriented to person, place, and time. She appears well-developed and well-nourished.  HENT:  Head: Normocephalic and atraumatic.  Neck: Normal range of motion. Neck supple.  Cervical Paraspinal Tenderness: C-4-  C-6  Cardiovascular: Normal rate and regular rhythm.   Pulmonary/Chest: Effort normal and breath sounds normal.  Musculoskeletal: She exhibits edema.  Normal Muscle Bulk and Muscle Testing Reveals: Upper Extremities: Right: Decreased ROM and Muscle Strength 4/5 Right AC Joint Tenderness Left: Full: ROM and Muscle Strength 5/5 Thoracic and Lumbar Hypersensitivity Lower Extremities: Full ROM and Muscle Strength 5/5 Bilateral Lower Extremity Flexion Produces Pain into Lumbar Arises from chair slowly using walker for support Antalgic Gait  Neurological: She is alert and oriented to person, place, and time.  Skin: Skin is warm and dry.  Psychiatric: She has a normal mood and affect.  Nursing note and vitals reviewed.  Assessment & Plan:  1.Lumbar pain lumbar spondylosis: Encouraged to increase Activity as tolerated and continue HEP. Refilled: Opana 30 mg one tablet every 12 hours as needed #60 and Tramadol 50 mg one tablet twice a day #60. 2.. Bilateral Knee Pain/ Degenerative: Continue Voltaren Gel. Continue to Monitor 3. Severe diabetic poly neuropathy: Continue Gabapentin.Continue to Monitor 4. Insomnia: On Trazodone PCP Following  5.Right Shoulder Impingement Syndrome: Refuses Steroids. Continue with Ice and Heat Regimen  20 minutes of face to face patient care time was spent during this visit. All questions were encouraged and answered.   F/U in 1 Month.

## 2015-04-24 ENCOUNTER — Ambulatory Visit (INDEPENDENT_AMBULATORY_CARE_PROVIDER_SITE_OTHER): Payer: Medicaid Other | Admitting: Internal Medicine

## 2015-04-24 ENCOUNTER — Encounter (INDEPENDENT_AMBULATORY_CARE_PROVIDER_SITE_OTHER): Payer: Self-pay | Admitting: Internal Medicine

## 2015-04-24 ENCOUNTER — Encounter (INDEPENDENT_AMBULATORY_CARE_PROVIDER_SITE_OTHER): Payer: Self-pay | Admitting: *Deleted

## 2015-04-24 ENCOUNTER — Ambulatory Visit: Payer: Medicaid Other | Admitting: "Endocrinology

## 2015-04-24 VITALS — BP 132/80 | HR 76 | Temp 98.1°F | Ht 66.0 in | Wt 257.6 lb

## 2015-04-24 DIAGNOSIS — R1310 Dysphagia, unspecified: Secondary | ICD-10-CM | POA: Diagnosis not present

## 2015-04-24 NOTE — Progress Notes (Addendum)
Subjective:    Patient ID: [name], female    DOB: November 03, 1950, 65 y.o.   MRN: [id number]  HPI She is here for follow up. She tells me she is having some dysphagia.  She says anything will bother her. Foods are lodging.  Her appetite is not good. She has not lost any weight. She has lower abdominal pain on occasion. She usually has a BM two BMs every other day. No melena or BRRB.  She is following a Celiac diet.    07/01/2013   EGD with ED.  Indications:  Patient is 89-year-old Caucasian female with chronic GERD who presents with solid food dysphagia. Her esophagus was last dilated in October 2014 with symptomatic improvement for several weeks. She underwent a barium esophagogram and barium pill held up the GE junction. She is therefore undergoing EGD with dilation.             Impression: No evidence of erosive esophagitis ring or stricture. Wavy GE junction. Biopsy taken from GE junction post dilation. Multiple polyps at gastric body and fundus previously documented to be hyperplastic. Therefore no biopsy was taken. Abnormal appearance to bulbar and post bulbar mucosa. Biopsy taken to rule out celiac disease. Esophagus dilated by passing 54 and 58 French Maloney dilators but no mucosal destruction no  Notes Recorded by Rogene Houston, MD on 07/05/2013 at 10:24 AM Duodenal biopsy shows changes of celiac disease Results reviewed with patient. Will obtain baseline celiac antibody panel Dietary consultation at [facility] ASAP. Report to PCPted.                f she does not respond to esophageal dilation will consider esophageal manometry and impedance study.                                                                                     12/31/2012 EGD, ED & Colonoscopy  Indications: Patient is 65 year old Caucasian female with multiple medical problems who presents with solid food dysphagia recurrent epigastric and left upper quadrant abdominal pain.She was evaluated  in emergency room 5 weeks ago and noted to have changes of transverse colitis. She was empirically treated with Augmentin. She she reports no improvement in her upper abdominal pain. She has not experienced diarrhea or rectal bleeding. Patient's last colonoscopy was May 2013. Family history is positive for colon carcinoma in mother and one brother.  Impression:  EGD findings; No evidence of erosive esophagitis, ring or stricture. No evidence of peptic ulcer disease. Multiple polyps involving proximal part of the stomach. Appearance suggestive of hyperplastic polyps. Four of these were biopsied for routine histology. Esophagus dilated by passing 54 and 56 French Maloney dilators but no mucosal disruption induced.  Colonoscopy findings; Small external hemorrhoids otherwise normal colonoscopy. No evidence of transverse colitis (noted on CT of 5 weeks ago which means it has resolved).    Review of Systems Past Medical History  Diagnosis Date  . Palpitations   . Tachycardia   . CAD (coronary artery disease)     Stent placement circumflex coronary 2007, catheterization 2008 patent stents. Normal LV function  . Chest pain   . Diabetes mellitus  Insulin dependent  . Dyslipidemia   . Anxiety and depression   . Diabetic polyneuropathy (HCC)      severe on multiple medications  . 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 MI (myocardial infarction) ([place])     2007  . Anxiety   . Depression   . Obstructive sleep apnea     CPAP machine at night  . COPD (chronic obstructive pulmonary disease) ([place])   . GERD (gastroesophageal reflux disease)   . CHF (congestive heart failure) ([place])   . Headache(784.0)   . Arthritis     Past Surgical History  Procedure Laterality Date  . Coronary angioplasty with stent placement    . Colonoscopy  08/06/2011    Procedure: COLONOSCOPY;  Surgeon: Rogene Houston, MD;   [facility];  Service: Endoscopy;  Laterality: N/A;  215  . Arm surgery Bilateral     due to fall-pt broke both forearms  . Colonoscopy with propofol N/A 12/31/2012    Procedure: COLONOSCOPY WITH PROPOFOL;  Surgeon: Rogene Houston, MD;  Location: [facility];  Service: Endoscopy;  Laterality: N/A;  in cecum at 0814, total withdrawal time 7min  . Esophagogastroduodenoscopy (egd) with propofol N/A 12/31/2012    Procedure: ESOPHAGOGASTRODUODENOSCOPY (EGD) WITH PROPOFOL;  Surgeon: Rogene Houston, MD;  Location: [facility];  Service: Endoscopy;  Laterality: N/A;  GE junction 37,  . Maloney dilation N/A 12/31/2012    Procedure: Venia Minks DILATION;  Surgeon: Rogene Houston, MD;  Location: [facility];  Service: Endoscopy;  Laterality: N/A;  used a # 54,#56  . Cholecystectomy    . Abdominal hysterectomy    . Esophagogastroduodenoscopy (egd) with propofol N/A 07/01/2013    Procedure: ESOPHAGOGASTRODUODENOSCOPY (EGD) WITH PROPOFOL;  Surgeon: Rogene Houston, MD;  Location: [facility];  Service: Endoscopy;  Laterality: N/A;  950  . Maloney dilation N/A 07/01/2013    Procedure: Venia Minks DILATION;  Surgeon: Rogene Houston, MD;  Location: [facility];  Service: Endoscopy;  Laterality: N/A;  54/58; no heme present  . Biopsy N/A 07/01/2013    Procedure: BIOPSY;  Surgeon: Rogene Houston, MD;  Location: [facility];  Service: Endoscopy;  Laterality: N/A;    Allergies  Allergen Reactions  . Nitrofuran Derivatives Itching and Swelling  . Amphetamine-Dextroamphet Er Swelling  . Pregabalin Swelling  . Topiramate Other (See Comments)    Tongue tingle  . Verelan [Verapamil] Rash    Current Outpatient Prescriptions on File Prior to Visit  Medication Sig Dispense Refill  . ALPRAZolam (XANAX) 1 MG tablet Take 1 tablet (1 mg total) by mouth 4 (four) times daily. 30 tablet 3  . aspirin EC 81 MG tablet Take 81 mg by mouth daily.    Marland Kitchen atorvastatin (LIPITOR) 40 MG tablet Take 40 mg by mouth daily.    . beclomethasone (QVAR) 40  MCG/ACT inhaler Inhale 1 puff into the lungs 2 (two) times daily.    Marland Kitchen dicyclomine (BENTYL) 10 MG capsule TAKE 1 CAPSULE BY MOUTH TWICE DAILY AS NEEDED FOR SPASMS. 60 capsule 5  . estrogens, conjugated, (PREMARIN) 1.25 MG tablet Take 1.25 mg by mouth daily.     . fluticasone-salmeterol (ADVAIR HFA) 115-21 MCG/ACT inhaler Inhale 2 puffs into the lungs 2 (two) times daily.    Marland Kitchen gabapentin (NEURONTIN) 400 MG capsule Take 400 mg by mouth 3 (three) times daily.    Marland Kitchen ibuprofen (ADVIL,MOTRIN) 200 MG tablet Take 200 mg by mouth daily as needed for headache.    . insulin glargine (LANTUS) 100  UNIT/ML injection Inject 30-60 Units into the skin 2 (two) times daily.     . insulin lispro (HUMALOG) 100 UNIT/ML injection Inject 2-10 Units into the skin 2 (two) times daily before a meal. Per patient this is now uses as a sliding scale.    . lidocaine (XYLOCAINE) 5 % ointment Apply 1 application topically at bedtime.     . methocarbamol (ROBAXIN) 500 MG tablet TAKE 1 TABLET BY MOUTH THREE TIMES DAILY 90 tablet 3  . nitroGLYCERIN (NITROSTAT) 0.4 MG SL tablet Place 1 tablet (0.4 mg total) under the tongue every 5 (five) minutes x 3 doses as needed. For chest pains 25 tablet 3  . ondansetron (ZOFRAN) 4 MG tablet Take 4 mg by mouth every 6 (six) hours as needed for nausea or vomiting.    Marland Kitchen oxymorphone (OPANA ER) 30 MG 12 hr tablet Take 1 tablet (30 mg total) by mouth every 12 (twelve) hours. 60 tablet 0  . pantoprazole (PROTONIX) 40 MG tablet TAKE 1 TABLET BY MOUTH TWICE DAILY BEFORE A MEAL. 60 tablet 5  . potassium chloride (K-DUR,KLOR-CON) 10 MEQ tablet TAKE 2 TABLETS BY MOUTH 2 TIMES DAILY. 120 tablet 6  . psyllium (METAMUCIL SMOOTH TEXTURE) 28 % packet Take 1 packet by mouth at bedtime.    . ranitidine (ZANTAC) 300 MG tablet Take 300 mg by mouth 2 (two) times daily.    . TOPROL XL 50 MG 24 hr tablet TAKE 1 TABLET BY MOUTH DAILY WITH OR IMMEDIATELY FOLLOWING A MEAL. 30 tablet 6  . traMADol (ULTRAM) 50 MG tablet Take  1 tablet (50 mg total) by mouth 2 (two) times daily. 60 tablet 3  . traZODone (DESYREL) 25 mg TABS tablet Take 0.5 tablets (25 mg total) by mouth at bedtime. (Patient taking differently: Take 50 mg by mouth at bedtime. ) 30 tablet 2  . VOLTAREN 1 % GEL APPLY 2 GRAMS ON BILATERAL KNEES THREE TIMES DAILY 300 g 2  . [DISCONTINUED] diltiazem (TIAZAC) 180 MG 24 hr capsule Take 1 capsule (180 mg total) by mouth daily. 30 capsule 1  . [DISCONTINUED] potassium chloride (K-DUR) 10 MEQ tablet Take 2 tablets (20 mEq total) by mouth 2 (two) times daily.     No current facility-administered medications on file prior to visit.        Objective:   Physical ExamBlood pressure 132/80, pulse 76, temperature 98.1 F (36.7 C), height 5\' 6"  (1.676 m), weight 257 lb 9.6 oz (116.847 kg).  Alert and oriented. Skin warm and dry. Oral mucosa is moist.   . Sclera anicteric, conjunctivae is pink. Thyroid not enlarged. No cervical lymphadenopathy. Lungs clear. Heart regular rate and rhythm.  Abdomen is soft. Bowel sounds are positive. No hepatomegaly. No abdominal masses felt. No tenderness.  No edema to lower extremities        Assessment & Plan:  Dysphagia. Am going to get an Esophagram.  She will probably end up with an esophageal manometry and impedance test.

## 2015-04-24 NOTE — Patient Instructions (Signed)
DG esophagram.   

## 2015-05-02 ENCOUNTER — Encounter: Payer: Self-pay | Admitting: "Endocrinology

## 2015-05-02 ENCOUNTER — Encounter: Payer: Medicaid Other | Attending: "Endocrinology | Admitting: Nutrition

## 2015-05-02 ENCOUNTER — Ambulatory Visit (INDEPENDENT_AMBULATORY_CARE_PROVIDER_SITE_OTHER): Payer: Medicaid Other | Admitting: "Endocrinology

## 2015-05-02 VITALS — Ht 66.0 in | Wt 259.0 lb

## 2015-05-02 VITALS — BP 148/80 | HR 84 | Ht 66.0 in | Wt 259.0 lb

## 2015-05-02 DIAGNOSIS — E118 Type 2 diabetes mellitus with unspecified complications: Secondary | ICD-10-CM

## 2015-05-02 DIAGNOSIS — E785 Hyperlipidemia, unspecified: Secondary | ICD-10-CM

## 2015-05-02 DIAGNOSIS — E1159 Type 2 diabetes mellitus with other circulatory complications: Secondary | ICD-10-CM | POA: Insufficient documentation

## 2015-05-02 DIAGNOSIS — E1165 Type 2 diabetes mellitus with hyperglycemia: Secondary | ICD-10-CM

## 2015-05-02 DIAGNOSIS — E669 Obesity, unspecified: Secondary | ICD-10-CM

## 2015-05-02 DIAGNOSIS — Z794 Long term (current) use of insulin: Secondary | ICD-10-CM

## 2015-05-02 DIAGNOSIS — IMO0002 Reserved for concepts with insufficient information to code with codable children: Secondary | ICD-10-CM

## 2015-05-02 MED ORDER — CANAGLIFLOZIN 100 MG PO TABS: 100.0000 mg | ORAL_TABLET | Freq: Every day | ORAL | Status: DC

## 2015-05-02 NOTE — Progress Notes (Signed)
  Medical Nutrition Therapy:  Appt start time: 1430 end time:  1500.  Assessment:  Primary concerns today: Diabetes. Type 2. Walk in from Dr. Dorris Fetch.. She is here with her husband. Never has met with an RD before. Latnus 50 units and Humalog with meals 15 units plus sliding scale. Tends to skip breakfast, sleep late and then eats lunch/dinner combo. Stays up at night and sleeps in during day.  Diet is inconsistent with meals, excessive in calories, fat, carbs, sodium and lacking in fresh fruits and vegetables, high fiber foods. Needs more exercise and drinking water. Needs to cut out diet sodas Safety questions will be answered at next visit due to short visit. Saw Dr. Dorris Fetch today.   Lab Results  Component Value Date   HGBA1C 8.6 03/22/2015    Preferred Learning Style:   No preference indicated   Learning Readiness:   Ready  Change in progress   MEDICATIONS: See list   DIETARY INTAKE:     24-hr recall:  B (  Gravy biscuit or omelet-cheese, bacon bits, Diet CF MT dew  Snk ( AM): orange L ( PM): hasn't eaten lunch: PB/Jelly sandwich on white bread, Milk 2% or Diet Soda, Snk ( PM): apple or fruit D ( PM): Yogurt, OR Grilled hamburger, porkchops or roast/potatoes etc.  Snk ( PM): Misc Beverages: DT. Dew, water  Usual physical activity: ADL;   Estimated energy needs: 1200 calories 135 g carbohydrates 90 g protein 33 g fat  Progress Towards Goal(s):  In progress.   Nutritional Diagnosis:  NB-1.1 Food and nutrition-related knowledge deficit As related to diabetes  As evidenced by A1C 8.6%. .    Intervention:  .Nutrition and Diabetes education provided on My Plate, CHO counting, meal planning, portion sizes, timing of meals, avoiding snacks between meals unless having a low blood sugar, target ranges for A1C and blood sugars, signs/symptoms and treatment of hyper/hypoglycemia, monitoring blood sugars, taking medications as prescribed, benefits of exercising 30 minutes per day  and prevention of complications of DM.  Goals 1. Follow My Plate Method 2. Eat 2-3 carb choices per meal. 3. Increase fresh fruits and vegetables. 4. Do not skip meals. 5. Eat meals on time schedule discussed. 6. Take insulin before meals with food sitting on plate in front of you. 7. Give insulin in abdomen area. Rotate giving shots in a clockwise direction for better absorption. 8. No snacks between meals. 9. Cut out diet sodas and only drink water. 10. Lose 1-2 lbs per week.   Teaching Method Utilized:  Visual Auditory Hands on  Handouts given during visit include:  The Plate Method   Meal Plan Card  Diabetes Instructions.   Barriers to learning/adherence to lifestyle change: None  Demonstrated degree of understanding via:  Teach Back   Monitoring/Evaluation:  Dietary intake, exercise, meal planning SBG, and body weight in 1 month(s).

## 2015-05-02 NOTE — Progress Notes (Signed)
Subjective:    Patient ID: [name], female    DOB: 1951-03-05. Patient is being seen in consultation for management of diabetes requested by  [name],DHRUV B., MD  Past Medical History  Diagnosis Date  . Palpitations   . Tachycardia   . CAD (coronary artery disease)     Stent placement circumflex coronary 2007, catheterization 2008 patent stents. Normal LV function  . Chest pain   . Diabetes mellitus     Insulin dependent  . Dyslipidemia   . Anxiety and depression   . Diabetic polyneuropathy (HCC)      severe on multiple medications  . 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 MI (myocardial infarction) ([place])     2007  . Anxiety   . Depression   . Obstructive sleep apnea     CPAP machine at night  . COPD (chronic obstructive pulmonary disease) ([place])   . GERD (gastroesophageal reflux disease)   . CHF (congestive heart failure) ([place])   . Headache(784.0)   . Arthritis    Past Surgical History  Procedure Laterality Date  . Coronary angioplasty with stent placement    . Colonoscopy  08/06/2011    Procedure: COLONOSCOPY;  Surgeon: Rogene Houston, MD;  Location: [facility];  Service: Endoscopy;  Laterality: N/A;  215  . Arm surgery Bilateral     due to fall-pt broke both forearms  . Colonoscopy with propofol N/A 12/31/2012    Procedure: COLONOSCOPY WITH PROPOFOL;  Surgeon: Rogene Houston, MD;  Location: [facility];  Service: Endoscopy;  Laterality: N/A;  in cecum at 0814, total withdrawal time 87min  . Esophagogastroduodenoscopy (egd) with propofol N/A 12/31/2012    Procedure: ESOPHAGOGASTRODUODENOSCOPY (EGD) WITH PROPOFOL;  Surgeon: Rogene Houston, MD;  Location: [facility];  Service: Endoscopy;  Laterality: N/A;  GE junction 37,  . Maloney dilation N/A 12/31/2012    Procedure: Venia Minks DILATION;  Surgeon: Rogene Houston, MD;  Location: [facility];  Service: Endoscopy;  Laterality: N/A;  used a # 54,#56  . Cholecystectomy    . Abdominal hysterectomy    . Esophagogastroduodenoscopy (egd) with  propofol N/A 07/01/2013    Procedure: ESOPHAGOGASTRODUODENOSCOPY (EGD) WITH PROPOFOL;  Surgeon: Rogene Houston, MD;  Location: [facility];  Service: Endoscopy;  Laterality: N/A;  950  . Maloney dilation N/A 07/01/2013    Procedure: Venia Minks DILATION;  Surgeon: Rogene Houston, MD;  Location: [facility];  Service: Endoscopy;  Laterality: N/A;  54/58; no heme present  . Biopsy N/A 07/01/2013    Procedure: BIOPSY;  Surgeon: Rogene Houston, MD;  Location: [facility];  Service: Endoscopy;  Laterality: N/A;   Social History   Social History  . Marital Status: Legally Separated    Spouse Name: N/A  . Number of Children: N/A  . Years of Education: N/A   Occupational History  . Disabled    Social History Main Topics  . Smoking status: Never Smoker   . Smokeless tobacco: Never Used  . Alcohol Use: No  . Drug Use: No  . Sexual Activity: Not Asked   Other Topics Concern  . None   Social History Narrative   Patient does not get regular exercise   Outpatient Encounter Prescriptions as of 05/02/2015  Medication Sig  . Insulin Glargine (LANTUS SOLOSTAR [place]) Inject 50 Units into the skin at bedtime.  . Insulin Lispro (HUMALOG KWIKPEN ) Inject 15-21 Units into the skin 3 (three) times daily with meals.  . [DISCONTINUED] Insulin Glargine (  LANTUS SOLOSTAR) 100 UNIT/ML Solostar Pen Inject 60 Units into the skin 2 (two) times daily.  . [DISCONTINUED] insulin lispro (HUMALOG KWIKPEN) 100 UNIT/ML KiwkPen Inject 2-15 Units into the skin 2 (two) times daily.  Marland Kitchen ALPRAZolam (XANAX) 1 MG tablet Take 1 tablet (1 mg total) by mouth 4 (four) times daily.  Marland Kitchen aspirin EC 81 MG tablet Take 81 mg by mouth daily.  Marland Kitchen atorvastatin (LIPITOR) 40 MG tablet Take 40 mg by mouth daily.  . beclomethasone (QVAR) 40 MCG/ACT inhaler Inhale 1 puff into the lungs 2 (two) times daily.  . canagliflozin (INVOKANA) 100 MG TABS tablet Take 1 tablet (100 mg total) by mouth daily before breakfast.  . dicyclomine (BENTYL) 10 MG capsule TAKE 1  CAPSULE BY MOUTH TWICE DAILY AS NEEDED FOR SPASMS.  Marland Kitchen estrogens, conjugated, (PREMARIN) 1.25 MG tablet Take 1.25 mg by mouth daily.   . fluticasone-salmeterol (ADVAIR HFA) 115-21 MCG/ACT inhaler Inhale 2 puffs into the lungs 2 (two) times daily.  Marland Kitchen gabapentin (NEURONTIN) 400 MG capsule Take 400 mg by mouth 3 (three) times daily.  Marland Kitchen ibuprofen (ADVIL,MOTRIN) 200 MG tablet Take 200 mg by mouth daily as needed for headache.  . lidocaine (XYLOCAINE) 5 % ointment Apply 1 application topically at bedtime.   . methocarbamol (ROBAXIN) 500 MG tablet TAKE 1 TABLET BY MOUTH THREE TIMES DAILY  . nitroGLYCERIN (NITROSTAT) 0.4 MG SL tablet Place 1 tablet (0.4 mg total) under the tongue every 5 (five) minutes x 3 doses as needed. For chest pains  . ondansetron (ZOFRAN) 4 MG tablet Take 4 mg by mouth every 6 (six) hours as needed for nausea or vomiting.  Marland Kitchen oxymorphone (OPANA ER) 30 MG 12 hr tablet Take 1 tablet (30 mg total) by mouth every 12 (twelve) hours.  . pantoprazole (PROTONIX) 40 MG tablet TAKE 1 TABLET BY MOUTH TWICE DAILY BEFORE A MEAL.  Marland Kitchen potassium chloride (K-DUR,KLOR-CON) 10 MEQ tablet TAKE 2 TABLETS BY MOUTH 2 TIMES DAILY.  Marland Kitchen psyllium (METAMUCIL SMOOTH TEXTURE) 28 % packet Take 1 packet by mouth at bedtime.  . ranitidine (ZANTAC) 300 MG tablet Take 300 mg by mouth 2 (two) times daily.  . TOPROL XL 50 MG 24 hr tablet TAKE 1 TABLET BY MOUTH DAILY WITH OR IMMEDIATELY FOLLOWING A MEAL.  Marland Kitchen traMADol (ULTRAM) 50 MG tablet Take 1 tablet (50 mg total) by mouth 2 (two) times daily.  . traZODone (DESYREL) 25 mg TABS tablet Take 0.5 tablets (25 mg total) by mouth at bedtime. (Patient taking differently: Take 50 mg by mouth at bedtime. )  . VOLTAREN 1 % GEL APPLY 2 GRAMS ON BILATERAL KNEES THREE TIMES DAILY  . [DISCONTINUED] insulin glargine (LANTUS) 100 UNIT/ML injection Inject 30-60 Units into the skin 2 (two) times daily.   . [DISCONTINUED] insulin lispro (HUMALOG) 100 UNIT/ML injection Inject 2-10 Units into  the skin 2 (two) times daily before a meal. Per patient this is now uses as a sliding scale.   No facility-administered encounter medications on file as of 05/02/2015.   ALLERGIES: Allergies  Allergen Reactions  . Nitrofuran Derivatives Itching and Swelling  . Amphetamine-Dextroamphet Er Swelling  . Pregabalin Swelling  . Topiramate Other (See Comments)    Tongue tingle  . Verelan [Verapamil] Rash   VACCINATION STATUS:  There is no immunization history on file for this patient.  Diabetes She presents for her initial diabetic visit. She has type 2 diabetes mellitus. Onset time: She was diagnosed at approximate age of 65 years. Her disease course  has been worsening. There are no hypoglycemic associated symptoms. Pertinent negatives for hypoglycemia include no confusion, headaches, pallor or seizures. Associated symptoms include fatigue, polydipsia and polyuria. Pertinent negatives for diabetes include no chest pain and no polyphagia. There are no hypoglycemic complications. Symptoms are worsening. Diabetic complications include autonomic neuropathy, heart disease, peripheral neuropathy and retinopathy. Risk factors for coronary artery disease include dyslipidemia, diabetes mellitus, obesity, hypertension and sedentary lifestyle. Current diabetic treatments: Lantus 65 units daily at bedtime, Humalog 15 units 3 times a day before meals. She mentions allergy to Victoza, Byetta, and metformin. Her weight is increasing steadily. She is following a generally unhealthy diet. When asked about meal planning, she reported none. She has not had a previous visit with a dietitian. She never participates in exercise. Home blood sugar record trend: She did not bring the meter nor log to review with her. An ACE inhibitor/angiotensin II receptor blocker is not being taken. Eye exam is current.  Hyperlipidemia This is a chronic problem. The current episode started more than 1 year ago. The problem is uncontrolled.  Recent lipid tests were reviewed and are high. Exacerbating diseases include diabetes and obesity. Pertinent negatives include no chest pain, myalgias or shortness of breath. Current antihyperlipidemic treatment includes statins. Risk factors for coronary artery disease include diabetes mellitus, dyslipidemia, hypertension, obesity and a sedentary lifestyle.  Hypertension This is a chronic problem. The current episode started more than 1 year ago. Pertinent negatives include no chest pain, headaches, palpitations or shortness of breath. Risk factors for coronary artery disease include diabetes mellitus, dyslipidemia, obesity and sedentary lifestyle. Hypertensive end-organ damage includes CAD/MI and retinopathy.    Review of Systems  Constitutional: Positive for fatigue. Negative for fever, chills and unexpected weight change.  HENT: Negative for trouble swallowing and voice change.   Eyes: Negative for visual disturbance.  Respiratory: Negative for cough, shortness of breath and wheezing.   Cardiovascular: Negative for chest pain, palpitations and leg swelling.  Gastrointestinal: Negative for nausea, vomiting and diarrhea.  Endocrine: Positive for polydipsia and polyuria. Negative for cold intolerance, heat intolerance and polyphagia.  Musculoskeletal: Negative for myalgias and arthralgias.  Skin: Negative for color change, pallor, rash and wound.  Neurological: Negative for seizures and headaches.  Psychiatric/Behavioral: Negative for suicidal ideas and confusion.    Objective:    BP 148/80 mmHg  Pulse 84  Ht 5\' 6"  (1.676 m)  Wt 259 lb (117.482 kg)  BMI 41.82 kg/m2  SpO2 93%  Wt Readings from Last 3 Encounters:  05/02/15 259 lb (117.482 kg)  04/24/15 257 lb 9.6 oz (116.847 kg)  04/03/14 246 lb 11.2 oz (111.902 kg)    Physical Exam  Constitutional: She is oriented to person, place, and time. She appears well-developed.  HENT:  Head: Normocephalic and atraumatic.  She has very poor  dentition.  Eyes: EOM are normal.  Neck: Normal range of motion. Neck supple. No tracheal deviation present. No thyromegaly present.  Cardiovascular: Normal rate and regular rhythm.   Pulmonary/Chest: Effort normal and breath sounds normal.  Abdominal: Soft. Bowel sounds are normal. There is no tenderness. There is no guarding.  Musculoskeletal: Normal range of motion. She exhibits no edema.  Neurological: She is alert and oriented to person, place, and time. She has normal reflexes. No cranial nerve deficit. Coordination normal.  Skin: Skin is warm and dry. No rash noted. No erythema. No pallor.  Psychiatric: She has a normal mood and affect. Judgment normal.    CMP  Component Value Date/Time   NA 141 06/30/2013 1540   K 4.8 06/30/2013 1540   CL 105 06/30/2013 1540   CO2 26 06/30/2013 1540   GLUCOSE 186* 06/30/2013 1540   BUN 8 06/30/2013 1540   CREATININE 0.66 06/30/2013 1540   CREATININE 0.62 04/06/2013 1602   CALCIUM 8.6 06/30/2013 1540   PROT 7.9 12/02/2012 1547   ALBUMIN 3.4* 12/02/2012 1547   AST 12 12/02/2012 1547   ALT 9 12/02/2012 1547   ALKPHOS 117 12/02/2012 1547   BILITOT 0.4 12/02/2012 1547   GFRNONAA >90 06/30/2013 1540   GFRAA >90 06/30/2013 1540   Diabetic Labs (most recent): Lab Results  Component Value Date   HGBA1C 8.6 03/22/2015   HGBA1C * 04/30/2007    8.1 (NOTE)   The ADA recommends the following therapeutic goals for glycemic   control related to Hgb A1C measurement:   Goal of Therapy:   < 7.0% Hgb A1C   Action Suggested:  > 8.0% Hgb A1C   Ref:  Diabetes Care, 22, Suppl. 1, 1999    Lipid Panel     Component Value Date/Time   CHOL  08/19/2006 0715    196        ATP III CLASSIFICATION:  <200     mg/dL   Desirable  200-239  mg/dL   Borderline High  >=240    mg/dL   High   TRIG 902 SLIGHT HEMOLYSIS* 08/19/2006 0715   HDL 47 SLIGHT HEMOLYSIS 08/19/2006 0715   CHOLHDL 4.2 08/19/2006 0715   VLDL UNABLE TO CALCULATE IF TRIGLYCERIDE OVER 400  mg/dL 08/19/2006 0715   LDLCALC  08/19/2006 0715    UNABLE TO CALCULATE IF TRIGLYCERIDE OVER 400 mg/dL        Total Cholesterol/HDL:CHD Risk Coronary Heart Disease Risk Table                     Men   Women  1/2 Average Risk   3.4   3.3     Assessment & Plan:   1. Type 2 diabetes mellitus with vascular disease ([place])  - Patient has currently uncontrolled symptomatic type 2 DM since  65 years of age,  with most recent A1c of 8.6 %. Recent labs reviewed.   Her diabetes is complicated by coronary artery disease status post stent placement and patient remains at a high risk for more acute and chronic complications of diabetes which include CAD, CVA, CKD, retinopathy, and neuropathy. These are all discussed in detail with the patient.  - I have counseled the patient on diet management and weight loss, by adopting a carbohydrate restricted/protein rich diet.  - Suggestion is made for patient to avoid simple carbohydrates   from their diet including Cakes , Desserts, Ice Cream,  Soda (  diet and regular) , Sweet Tea , Candies,  Chips, Cookies, Artificial Sweeteners,   and "Sugar-free" Products . This will help patient to have stable blood glucose profile and potentially avoid unintended weight gain.  - I encouraged the patient to switch to  unprocessed or minimally processed complex starch and increased protein intake (animal or plant source), fruits, and vegetables.  - Patient is advised to stick to a routine mealtimes to eat 3 meals  a day and avoid unnecessary snacks ( to snack only to correct hypoglycemia).  - The patient will be scheduled with Jearld Fenton, RDN, CDE for individualized DM education.  - I have approached patient with the following individualized plan to manage diabetes  and patient agrees:   - I  will proceed to readjust her basal insulin Lantus to 50 units QHS, and prandial insulin Humalog 15 units TIDAC for pre-meal BG readings of 90-150mg /dl, plus patient specific  correction dose for unexpected hyperglycemia above 150mg /dl, associated with strict monitoring of glucose  AC and HS. - Patient is warned not to take insulin without proper monitoring per orders. -Adjustment parameters are given for hypo and hyperglycemia in writing. -Patient is encouraged to call clinic for blood glucose levels less than 70 or above 300 mg /dl. -She mentions allergy to a number of use for medications unfortunately, Victoza, Byetta, and metformin.  - I will add Invokana 100mg  by mouth every morning. Side effects and precautions discussed with her.  - Patient specific target  A1c;  LDL, HDL, Triglycerides, and  Waist Circumference were discussed in detail.  2) BP/HTN: Controlled. Continue current medications . 3) Lipids/HPL:  Uncontrolled with triglycerides of 902, continue statins. Better control of diabetes will help for triglycerides. 4)  Weight/Diet: CDE Consult will be initiated , exercise, and detailed carbohydrates information provided.  5) Chronic Care/Health Maintenance:  -Patient is on Statin medications and encouraged to continue to follow up with Ophthalmology, Podiatrist at least yearly or according to recommendations, and advised to   stay away from smoking. I have recommended yearly flu vaccine and pneumonia vaccination at least every 5 years; moderate intensity exercise for up to 150 minutes weekly; and  sleep for at least 7 hours a day.  - 60 minutes of time was spent on the care of this patient , 50% of which was applied for counseling on diabetes complications and their preventions.  - Patient to bring meter and  blood glucose logs during their next visit.   - I advised patient to maintain close follow up with [name],DHRUV B., MD for primary care needs.  Follow up plan: - Return in about 1 week (around 05/09/2015) for diabetes, high blood pressure, high cholesterol, follow up with meter and logs- no labs.  Glade Lloyd, MD Phone: [phone]  Fax:  [phone]   05/02/2015, 3:59 PM

## 2015-05-02 NOTE — Patient Instructions (Signed)
Advice for weight management -For most of us the best way to lose weight is by diet management. Generally speaking, diet management means restricting carbohydrate consumption to minimum possible (and to unprocessed or minimally processed complex starch) and increasing protein intake (animal or plant source), fruits, and vegetables.  -Sticking to a routine mealtime to eat 3 meals a day and avoiding unnecessary snacks is shown to have a big role in weight control.  -It is better to avoid simple carbohydrates including: Cakes, Desserts, Ice Cream, Soda (diet and regular), Sweet Tea, Candies, Chips, Cookies, Artificial Sweeteners, and "Sugar-free" Products.   -Exercise: 30 minutes a day 3-4 days a week, or 150 minutes a week. Combine stretch, strength, and aerobic activities. You may seek evaluation by your heart doctor prior to initiating exercise if you have high risk for heart disease.  -If you are interested, we can schedule a visit with [name], RDN, CDE for individualized nutrition education.  

## 2015-05-03 ENCOUNTER — Ambulatory Visit (HOSPITAL_COMMUNITY)
Admission: RE | Admit: 2015-05-03 | Discharge: 2015-05-03 | Disposition: A | Discharge status: Home / Self Care | Payer: Medicaid Other | Source: Ambulatory Visit | Attending: Internal Medicine | Admitting: Internal Medicine

## 2015-05-03 DIAGNOSIS — R1310 Dysphagia, unspecified: Secondary | ICD-10-CM | POA: Diagnosis present

## 2015-05-08 ENCOUNTER — Other Ambulatory Visit (INDEPENDENT_AMBULATORY_CARE_PROVIDER_SITE_OTHER): Payer: Self-pay | Admitting: Internal Medicine

## 2015-05-08 DIAGNOSIS — R1319 Other dysphagia: Secondary | ICD-10-CM

## 2015-05-09 ENCOUNTER — Encounter (INDEPENDENT_AMBULATORY_CARE_PROVIDER_SITE_OTHER): Payer: Self-pay | Admitting: *Deleted

## 2015-05-09 NOTE — Patient Instructions (Signed)
Goals 1. Follow My Plate Method 2. Eat 2-3 carb choices per meal. 3. Increase fresh fruits and vegetables. 4. Do not skip meals. 5. Eat meals on time schedule discussed. 6. Take insulin before meals with food sitting on plate in front of you. 7. Give insulin in abdomen area. Rotate giving shots in a clockwise direction for better absorption. 8. No snacks between meals. 9. Cut out diet sodas and only drink water. 10. Lose 1-2 lbs per week.

## 2015-05-11 ENCOUNTER — Encounter: Payer: Self-pay | Admitting: Registered Nurse

## 2015-05-11 ENCOUNTER — Encounter: Payer: Medicaid Other | Attending: Physical Medicine & Rehabilitation | Admitting: Registered Nurse

## 2015-05-11 VITALS — BP 147/65 | HR 84 | Resp 14

## 2015-05-11 DIAGNOSIS — M25562 Pain in left knee: Secondary | ICD-10-CM

## 2015-05-11 DIAGNOSIS — R079 Chest pain, unspecified: Secondary | ICD-10-CM | POA: Diagnosis not present

## 2015-05-11 DIAGNOSIS — M79604 Pain in right leg: Secondary | ICD-10-CM | POA: Diagnosis not present

## 2015-05-11 DIAGNOSIS — R002 Palpitations: Secondary | ICD-10-CM | POA: Diagnosis not present

## 2015-05-11 DIAGNOSIS — E785 Hyperlipidemia, unspecified: Secondary | ICD-10-CM | POA: Diagnosis not present

## 2015-05-11 DIAGNOSIS — Z1401 Asymptomatic hemophilia A carrier: Secondary | ICD-10-CM | POA: Diagnosis not present

## 2015-05-11 DIAGNOSIS — K219 Gastro-esophageal reflux disease without esophagitis: Secondary | ICD-10-CM | POA: Insufficient documentation

## 2015-05-11 DIAGNOSIS — M79605 Pain in left leg: Secondary | ICD-10-CM | POA: Insufficient documentation

## 2015-05-11 DIAGNOSIS — R000 Tachycardia, unspecified: Secondary | ICD-10-CM | POA: Insufficient documentation

## 2015-05-11 DIAGNOSIS — E1142 Type 2 diabetes mellitus with diabetic polyneuropathy: Secondary | ICD-10-CM | POA: Diagnosis not present

## 2015-05-11 DIAGNOSIS — I509 Heart failure, unspecified: Secondary | ICD-10-CM | POA: Insufficient documentation

## 2015-05-11 DIAGNOSIS — R51 Headache: Secondary | ICD-10-CM | POA: Insufficient documentation

## 2015-05-11 DIAGNOSIS — M47896 Other spondylosis, lumbar region: Secondary | ICD-10-CM | POA: Diagnosis not present

## 2015-05-11 DIAGNOSIS — J449 Chronic obstructive pulmonary disease, unspecified: Secondary | ICD-10-CM | POA: Insufficient documentation

## 2015-05-11 DIAGNOSIS — G8929 Other chronic pain: Secondary | ICD-10-CM | POA: Insufficient documentation

## 2015-05-11 DIAGNOSIS — F418 Other specified anxiety disorders: Secondary | ICD-10-CM | POA: Insufficient documentation

## 2015-05-11 DIAGNOSIS — M1990 Unspecified osteoarthritis, unspecified site: Secondary | ICD-10-CM | POA: Insufficient documentation

## 2015-05-11 DIAGNOSIS — Z5181 Encounter for therapeutic drug level monitoring: Secondary | ICD-10-CM

## 2015-05-11 DIAGNOSIS — G4733 Obstructive sleep apnea (adult) (pediatric): Secondary | ICD-10-CM | POA: Diagnosis not present

## 2015-05-11 DIAGNOSIS — Z794 Long term (current) use of insulin: Secondary | ICD-10-CM | POA: Diagnosis not present

## 2015-05-11 DIAGNOSIS — G894 Chronic pain syndrome: Secondary | ICD-10-CM

## 2015-05-11 DIAGNOSIS — G4700 Insomnia, unspecified: Secondary | ICD-10-CM | POA: Insufficient documentation

## 2015-05-11 DIAGNOSIS — Z760 Encounter for issue of repeat prescription: Secondary | ICD-10-CM | POA: Diagnosis not present

## 2015-05-11 DIAGNOSIS — I2510 Atherosclerotic heart disease of native coronary artery without angina pectoris: Secondary | ICD-10-CM | POA: Diagnosis not present

## 2015-05-11 DIAGNOSIS — I252 Old myocardial infarction: Secondary | ICD-10-CM | POA: Diagnosis not present

## 2015-05-11 DIAGNOSIS — M47816 Spondylosis without myelopathy or radiculopathy, lumbar region: Secondary | ICD-10-CM | POA: Diagnosis not present

## 2015-05-11 DIAGNOSIS — M5489 Other dorsalgia: Secondary | ICD-10-CM | POA: Diagnosis not present

## 2015-05-11 DIAGNOSIS — M546 Pain in thoracic spine: Secondary | ICD-10-CM

## 2015-05-11 DIAGNOSIS — Z79899 Other long term (current) drug therapy: Secondary | ICD-10-CM

## 2015-05-11 DIAGNOSIS — M25561 Pain in right knee: Secondary | ICD-10-CM

## 2015-05-11 MED ORDER — OXYMORPHONE HCL ER 30 MG PO TB12
30.0000 mg | ORAL_TABLET | Freq: Two times a day (BID) | ORAL | Status: DC
Start: 2015-05-11 — End: 2015-06-11

## 2015-05-11 NOTE — Progress Notes (Signed)
Subjective:    Patient ID: [name], female    DOB: Nov 05, 1950, 65 y.o.   MRN: [id number]  HPI: [name] is a 65 year old female who returns for follow up for chronic pain and medication refill. She states her pain is located in her bilateral shoulder's,mid- lower back bilateral lower extremities and bilateral feet. Also states she's having generalized pain all over. She rates her pain 8. Her current exercise regime is chair exercises and walking short distances. Also states she is scheduled for Esophageal Dilation on 06/07/15 with Dr. Bernadene Person.  Pain Inventory Average Pain 9 Pain Right Now 8 My pain is sharp, burning, stabbing, tingling and aching  In the last 24 hours, has pain interfered with the following? General activity 8 Relation with others 9 Enjoyment of life 9 What TIME of day is your pain at its worst? morning, evening, night Sleep (in general) Poor  Pain is worse with: walking, bending, sitting, inactivity, standing and some activites Pain improves with: medication Relief from Meds: a little  Mobility use a walker how many minutes can you walk? 5 ability to climb steps?  no do you drive?  no needs help with transfers  Function I need assistance with the following:  dressing, bathing, meal prep, household duties and shopping Do you have any goals in this area?  no  Neuro/Psych bladder control problems bowel control problems weakness numbness tingling trouble walking dizziness confusion depression anxiety loss of taste or smell suicidal thoughts  Prior Studies Any changes since last visit?  yes x-rays  Physicians involved in your care Any changes since last visit?  no   Family History  Problem Relation Age of Onset  . Cancer    . Coronary artery disease     Social History   Social History  . Marital Status: Legally Separated    Spouse Name: N/A  . Number of Children: N/A  . Years of Education: N/A   Occupational History    . Disabled    Social History Main Topics  . Smoking status: Never Smoker   . Smokeless tobacco: Never Used  . Alcohol Use: No  . Drug Use: No  . Sexual Activity: Not Asked   Other Topics Concern  . None   Social History Narrative   Patient does not get regular exercise   Past Surgical History  Procedure Laterality Date  . Coronary angioplasty with stent placement    . Colonoscopy  08/06/2011    Procedure: COLONOSCOPY;  Surgeon: Rogene Houston, MD;  Location: [facility];  Service: Endoscopy;  Laterality: N/A;  215  . Arm surgery Bilateral     due to fall-pt broke both forearms  . Colonoscopy with propofol N/A 12/31/2012    Procedure: COLONOSCOPY WITH PROPOFOL;  Surgeon: Rogene Houston, MD;  Location: [facility];  Service: Endoscopy;  Laterality: N/A;  in cecum at 0814, total withdrawal time 89min  . Esophagogastroduodenoscopy (egd) with propofol N/A 12/31/2012    Procedure: ESOPHAGOGASTRODUODENOSCOPY (EGD) WITH PROPOFOL;  Surgeon: Rogene Houston, MD;  Location: [facility];  Service: Endoscopy;  Laterality: N/A;  GE junction 37,  . Maloney dilation N/A 12/31/2012    Procedure: Venia Minks DILATION;  Surgeon: Rogene Houston, MD;  Location: [facility];  Service: Endoscopy;  Laterality: N/A;  used a # 54,#56  . Cholecystectomy    . Abdominal hysterectomy    . Esophagogastroduodenoscopy (egd) with propofol N/A 07/01/2013    Procedure: ESOPHAGOGASTRODUODENOSCOPY (EGD) WITH PROPOFOL;  Surgeon: Rogene Houston, MD;  Location: [facility];  Service: Endoscopy;  Laterality: N/A;  950  . Maloney dilation N/A 07/01/2013    Procedure: Venia Minks DILATION;  Surgeon: Rogene Houston, MD;  Location: [facility];  Service: Endoscopy;  Laterality: N/A;  54/58; no heme present  . Biopsy N/A 07/01/2013    Procedure: BIOPSY;  Surgeon: Rogene Houston, MD;  Location: [facility];  Service: Endoscopy;  Laterality: N/A;   Past Medical History  Diagnosis Date  . Palpitations   . Tachycardia   . CAD (coronary artery disease)      Stent placement circumflex coronary 2007, catheterization 2008 patent stents. Normal LV function  . Chest pain   . Diabetes mellitus     Insulin dependent  . Dyslipidemia   . Anxiety and depression   . Diabetic polyneuropathy (HCC)      severe on multiple medications  . 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 MI (myocardial infarction) ([place])     2007  . Anxiety   . Depression   . Obstructive sleep apnea     CPAP machine at night  . COPD (chronic obstructive pulmonary disease) ([place])   . GERD (gastroesophageal reflux disease)   . CHF (congestive heart failure) ([place])   . Headache(784.0)   . Arthritis    BP 147/65 mmHg  Pulse 84  Resp 14  SpO2 95%  Opioid Risk Score:   Fall Risk Score:  `1  Depression screen PHQ 2/9  Depression screen [facility] 2/9 05/02/2015 12/11/2014 11/13/2014 10/12/2014  Decreased Interest 0 3 3 3   Down, Depressed, Hopeless 0 3 3 3   PHQ - 2 Score 0 6 6 6   Altered sleeping - - - 2  Tired, decreased energy - - - 3  Change in appetite - - - 3  Feeling bad or failure about yourself  - - - 0  Trouble concentrating - - - 0  Moving slowly or fidgety/restless - - - 0  Suicidal thoughts - - - 0  PHQ-9 Score - - - 14     Review of Systems  Constitutional:       Bladder control problems Bowel control problems  Loss of taste or smell  Respiratory: Positive for shortness of breath.   Endocrine:       High blood sugar Low blood sugar  Musculoskeletal: Positive for gait problem.  Neurological: Positive for dizziness, weakness and numbness.       Tingling   Psychiatric/Behavioral: Positive for confusion and dysphoric mood. The patient is nervous/anxious.   All other systems reviewed and are negative.      Objective:   Physical Exam  Constitutional: She is oriented to person, place, and time. She appears well-developed and well-nourished.  HENT:  Head: Normocephalic and atraumatic.  Neck: Normal range of motion. Neck supple.  Cardiovascular: Normal rate and regular  rhythm.   Pulmonary/Chest: Effort normal and breath sounds normal.  Musculoskeletal:  Normal Muscle Bulk and Muscle Testing Reveals: Upper Extremities: Full ROM and Muscle Strength 5/5 Thoracic and Lumbar Hypersensitivity Lower Extremities: Full ROM and Muscle Strength 5/5 Bilateral Lower Extremities Flexion Produces pain into Lumbar Arises from chair slolwy using cadillac walker for support Narrow Based Gait  Neurological: She is alert and oriented to person, place, and time.  Skin: Skin is warm and dry.  Psychiatric: She has a normal mood and affect.  Nursing note and vitals reviewed.         Assessment & Plan:  1.Lumbar pain lumbar  spondylosis: Encouraged to increase Activity as tolerated and continue HEP. Refilled: Opana 30 mg one tablet every 12 hours as needed #60 and Continue Tramadol 50 mg one tablet twice a day #60. 2.. Bilateral Knee Pain/ Degenerative: Continue Voltaren Gel. Continue to Monitor 3. Severe diabetic poly neuropathy: Continue Gabapentin.Continue to Monitor 4. Insomnia: On Trazodone PCP Following   20 minutes of face to face patient care time was spent during this visit. All questions were encouraged and answered.   F/U in 1 Month.

## 2015-05-14 ENCOUNTER — Encounter: Payer: Self-pay | Admitting: "Endocrinology

## 2015-05-14 ENCOUNTER — Encounter: Payer: Self-pay | Admitting: Nutrition

## 2015-05-14 ENCOUNTER — Ambulatory Visit (INDEPENDENT_AMBULATORY_CARE_PROVIDER_SITE_OTHER): Payer: Medicaid Other | Admitting: "Endocrinology

## 2015-05-14 ENCOUNTER — Encounter: Payer: Medicaid Other | Admitting: Nutrition

## 2015-05-14 VITALS — Ht 66.0 in | Wt 254.0 lb

## 2015-05-14 VITALS — BP 132/70 | HR 87 | Ht 66.0 in | Wt 254.0 lb

## 2015-05-14 DIAGNOSIS — I10 Essential (primary) hypertension: Secondary | ICD-10-CM | POA: Diagnosis not present

## 2015-05-14 DIAGNOSIS — E785 Hyperlipidemia, unspecified: Secondary | ICD-10-CM

## 2015-05-14 DIAGNOSIS — E1159 Type 2 diabetes mellitus with other circulatory complications: Secondary | ICD-10-CM | POA: Diagnosis not present

## 2015-05-14 DIAGNOSIS — E118 Type 2 diabetes mellitus with unspecified complications: Principal | ICD-10-CM

## 2015-05-14 DIAGNOSIS — IMO0002 Reserved for concepts with insufficient information to code with codable children: Secondary | ICD-10-CM

## 2015-05-14 DIAGNOSIS — Z794 Long term (current) use of insulin: Principal | ICD-10-CM

## 2015-05-14 DIAGNOSIS — E1165 Type 2 diabetes mellitus with hyperglycemia: Secondary | ICD-10-CM

## 2015-05-14 MED ORDER — INSULIN GLARGINE 100 UNIT/ML SOLOSTAR PEN: 50.0000 [IU] | PEN_INJECTOR | Freq: Every day | SUBCUTANEOUS | Status: DC

## 2015-05-14 MED ORDER — INSULIN PEN NEEDLE 31G X 8 MM MISC: 1.0000 | Status: DC

## 2015-05-14 MED ORDER — INSULIN LISPRO 100 UNIT/ML (KWIKPEN): 15.0000 [IU] | PEN_INJECTOR | Freq: Three times a day (TID) | SUBCUTANEOUS | Status: DC

## 2015-05-14 MED ORDER — GLUCOSE BLOOD VI STRP: ORAL_STRIP | Status: DC

## 2015-05-14 NOTE — Patient Instructions (Signed)

## 2015-05-14 NOTE — Progress Notes (Signed)
  Medical Nutrition Therapy:  Appt start time: 1430 end time:  1500.  Assessment:  Primary concerns today: Diabetes. Type 2. She is here with her CNA. Feeling much better. Lost 5 lbs. Dr. Dorris Fetch today added Invokana 100 mg today. She is still to be taking 50 units of Latnus and 10 units of Humalog with meals. Eating much better. Had 2 low blood sugars but overall much better blood sugars. AVG 131 and 30 day 109 mg/dl.. Takes Lantus in am so CNA can make sure she takes it correctly. Getting better with her meal routine and working on cutting out snacks between meals. Drinking water.  Lab Results  Component Value Date   HGBA1C 8.6 03/22/2015    Preferred Learning Style:   No preference indicated   Learning Readiness:   Ready  Change in progress   MEDICATIONS: See list   DIETARY INTAKE:     24-hr recall:  Breakast: Oatmeal or sandwich and fruit, water Lunch: pinto bean, collard, cabbage, mashed potatoes and water Dinner: Hamburger, salad, diluted juice Snack sometimes; yogurt or fruit or occassional cheese and crackers or nuts. Drinks: water and some diluted juice.  Usual physical activity: ADL;   Estimated energy needs: 1200 calories 135 g carbohydrates 90 g protein 33 g fat  Progress Towards Goal(s):  In progress.   Nutritional Diagnosis:  NB-1.1 Food and nutrition-related knowledge deficit As related to diabetes  As evidenced by A1C 8.6%. .    Intervention:  .Nutrition and Diabetes education provided on My Plate, CHO counting, meal planning, portion sizes, timing of meals, avoiding snacks between meals unless having a low blood sugar, target ranges for A1C and blood sugars, signs/symptoms and treatment of hyper/hypoglycemia, monitoring blood sugars, taking medications as prescribed, benefits of exercising 30 minutes per day and prevention of complications of DM.  Goals Keep up the good job! 1. Follow My Plate Method 2. Eat 2-3 carb choices per meal. 3. Increase fresh  fruits and vegetables. 4. Do not skip meals. 5. Eat meals on time schedule discussed. 6. Take insulin before meals with food sitting on plate in front of you. 7. Walk in house for 10 minutes twice a day for exercise. 8. Get A1C down to 7.% 9. Lose 1 lb per week.  Teaching Method Utilized:  Visual Auditory Hands on  Handouts given during visit include:  The Plate Method   Meal Plan Card  Diabetes Instructions.   Barriers to learning/adherence to lifestyle change: None  Demonstrated degree of understanding via:  Teach Back   Monitoring/Evaluation:  Dietary intake, exercise, meal planning SBG, and body weight in 2-3 month(s).

## 2015-05-14 NOTE — Patient Instructions (Signed)
Goals Keep up the good job! 1. Follow My Plate Method 2. Eat 2-3 carb choices per meal. 3. Increase fresh fruits and vegetables. 4. Do not skip meals. 5. Eat meals on time schedule discussed. 6. Take insulin before meals with food sitting on plate in front of you. 7. Walk in house for 10 minutes twice a day for exercise. 8. Get A1C down to 7.% 9. Lose 1 lb per week.

## 2015-05-14 NOTE — Progress Notes (Signed)
Subjective:    Patient ID: [name], female    DOB: 1950/08/03. Patient is being seen in consultation for management of diabetes requested by  [name],DHRUV B., MD  Past Medical History  Diagnosis Date  . Palpitations   . Tachycardia   . CAD (coronary artery disease)     Stent placement circumflex coronary 2007, catheterization 2008 patent stents. Normal LV function  . Chest pain   . Diabetes mellitus     Insulin dependent  . Dyslipidemia   . Anxiety and depression   . Diabetic polyneuropathy (HCC)      severe on multiple medications  . 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 MI (myocardial infarction) ([place])     2007  . Anxiety   . Depression   . Obstructive sleep apnea     CPAP machine at night  . COPD (chronic obstructive pulmonary disease) ([place])   . GERD (gastroesophageal reflux disease)   . CHF (congestive heart failure) ([place])   . Headache(784.0)   . Arthritis    Past Surgical History  Procedure Laterality Date  . Coronary angioplasty with stent placement    . Colonoscopy  08/06/2011    Procedure: COLONOSCOPY;  Surgeon: Rogene Houston, MD;  Location: [facility];  Service: Endoscopy;  Laterality: N/A;  215  . Arm surgery Bilateral     due to fall-pt broke both forearms  . Colonoscopy with propofol N/A 12/31/2012    Procedure: COLONOSCOPY WITH PROPOFOL;  Surgeon: Rogene Houston, MD;  Location: [facility];  Service: Endoscopy;  Laterality: N/A;  in cecum at 0814, total withdrawal time 52min  . Esophagogastroduodenoscopy (egd) with propofol N/A 12/31/2012    Procedure: ESOPHAGOGASTRODUODENOSCOPY (EGD) WITH PROPOFOL;  Surgeon: Rogene Houston, MD;  Location: [facility];  Service: Endoscopy;  Laterality: N/A;  GE junction 37,  . Maloney dilation N/A 12/31/2012    Procedure: Venia Minks DILATION;  Surgeon: Rogene Houston, MD;  Location: [facility];  Service: Endoscopy;  Laterality: N/A;  used a # 54,#56  . Cholecystectomy    . Abdominal hysterectomy    . Esophagogastroduodenoscopy (egd) with  propofol N/A 07/01/2013    Procedure: ESOPHAGOGASTRODUODENOSCOPY (EGD) WITH PROPOFOL;  Surgeon: Rogene Houston, MD;  Location: [facility];  Service: Endoscopy;  Laterality: N/A;  950  . Maloney dilation N/A 07/01/2013    Procedure: Venia Minks DILATION;  Surgeon: Rogene Houston, MD;  Location: [facility];  Service: Endoscopy;  Laterality: N/A;  54/58; no heme present  . Biopsy N/A 07/01/2013    Procedure: BIOPSY;  Surgeon: Rogene Houston, MD;  Location: [facility];  Service: Endoscopy;  Laterality: N/A;   Social History   Social History  . Marital Status: Legally Separated    Spouse Name: N/A  . Number of Children: N/A  . Years of Education: N/A   Occupational History  . Disabled    Social History Main Topics  . Smoking status: Never Smoker   . Smokeless tobacco: Never Used  . Alcohol Use: No  . Drug Use: No  . Sexual Activity: Not Asked   Other Topics Concern  . None   Social History Narrative   Patient does not get regular exercise   Outpatient Encounter Prescriptions as of 05/14/2015  Medication Sig  . ALPRAZolam (XANAX) 1 MG tablet Take 1 tablet (1 mg total) by mouth 4 (four) times daily.  Marland Kitchen aspirin EC 81 MG tablet Take 81 mg by mouth daily.  Marland Kitchen atorvastatin (LIPITOR) 40 MG tablet  Take 40 mg by mouth daily.  . beclomethasone (QVAR) 40 MCG/ACT inhaler Inhale 1 puff into the lungs 2 (two) times daily.  Marland Kitchen dicyclomine (BENTYL) 10 MG capsule TAKE 1 CAPSULE BY MOUTH TWICE DAILY AS NEEDED FOR SPASMS.  Marland Kitchen estrogens, conjugated, (PREMARIN) 1.25 MG tablet Take 1.25 mg by mouth daily.   . fluticasone-salmeterol (ADVAIR HFA) 115-21 MCG/ACT inhaler Inhale 2 puffs into the lungs 2 (two) times daily.  Marland Kitchen gabapentin (NEURONTIN) 400 MG capsule Take 400 mg by mouth 3 (three) times daily.  Marland Kitchen ibuprofen (ADVIL,MOTRIN) 200 MG tablet Take 200 mg by mouth daily as needed for headache.  . Insulin Glargine (LANTUS SOLOSTAR) 100 UNIT/ML Solostar Pen Inject 50 Units into the skin daily with breakfast.  . insulin  lispro (HUMALOG KWIKPEN) 100 UNIT/ML KiwkPen Inject 0.15-0.21 mLs (15-21 Units total) into the skin 3 (three) times daily with meals.  . lidocaine (XYLOCAINE) 5 % ointment Apply 1 application topically at bedtime.   . nitroGLYCERIN (NITROSTAT) 0.4 MG SL tablet Place 1 tablet (0.4 mg total) under the tongue every 5 (five) minutes x 3 doses as needed. For chest pains  . ondansetron (ZOFRAN) 4 MG tablet Take 4 mg by mouth every 6 (six) hours as needed for nausea or vomiting.  Marland Kitchen oxymorphone (OPANA ER) 30 MG 12 hr tablet Take 1 tablet (30 mg total) by mouth every 12 (twelve) hours.  . pantoprazole (PROTONIX) 40 MG tablet TAKE 1 TABLET BY MOUTH TWICE DAILY BEFORE A MEAL.  Marland Kitchen potassium chloride (K-DUR,KLOR-CON) 10 MEQ tablet TAKE 2 TABLETS BY MOUTH 2 TIMES DAILY.  Marland Kitchen psyllium (METAMUCIL SMOOTH TEXTURE) 28 % packet Take 1 packet by mouth at bedtime.  . ranitidine (ZANTAC) 300 MG tablet Take 300 mg by mouth 2 (two) times daily.  . TOPROL XL 50 MG 24 hr tablet TAKE 1 TABLET BY MOUTH DAILY WITH OR IMMEDIATELY FOLLOWING A MEAL.  Marland Kitchen traMADol (ULTRAM) 50 MG tablet Take 1 tablet (50 mg total) by mouth 2 (two) times daily.  . traZODone (DESYREL) 25 mg TABS tablet Take 0.5 tablets (25 mg total) by mouth at bedtime. (Patient taking differently: Take 50 mg by mouth at bedtime. )  . VOLTAREN 1 % GEL APPLY 2 GRAMS ON BILATERAL KNEES THREE TIMES DAILY  . [DISCONTINUED] Insulin Glargine (LANTUS SOLOSTAR [place]) Inject 50 Units into the skin at bedtime.  . [DISCONTINUED] Insulin Lispro (HUMALOG KWIKPEN ) Inject 15-21 Units into the skin 3 (three) times daily with meals.  . canagliflozin (INVOKANA) 100 MG TABS tablet Take 1 tablet (100 mg total) by mouth daily before breakfast. (Patient not taking: Reported on 05/14/2015)  . glucose blood (ACCU-CHEK AVIVA) test strip Use to test 4 times a day  . Insulin Pen Needle (B-D ULTRAFINE III SHORT PEN) 31G X 8 MM MISC 1 each by Does not apply route as directed.   No  facility-administered encounter medications on file as of 05/14/2015.   ALLERGIES: Allergies  Allergen Reactions  . Nitrofuran Derivatives Itching and Swelling  . Amphetamine-Dextroamphet Er Swelling  . Pregabalin Swelling  . Topiramate Other (See Comments)    Tongue tingle  . Verelan [Verapamil] Rash   VACCINATION STATUS:  There is no immunization history on file for this patient.  Diabetes She presents for her follow-up diabetic visit. She has type 2 diabetes mellitus. Onset time: She was diagnosed at approximate age of 80 years. Her disease course has been improving. There are no hypoglycemic associated symptoms. Pertinent negatives for hypoglycemia include no confusion, headaches, pallor  or seizures. Associated symptoms include fatigue. Pertinent negatives for diabetes include no chest pain, no polydipsia, no polyphagia and no polyuria. There are no hypoglycemic complications. Symptoms are improving. Diabetic complications include autonomic neuropathy, heart disease, peripheral neuropathy and retinopathy. Risk factors for coronary artery disease include dyslipidemia, diabetes mellitus, obesity, hypertension and sedentary lifestyle. Current diabetic treatments: Lantus 65 units daily at bedtime, Humalog 15 units 3 times a day before meals. She mentions allergy to Victoza, Byetta, and metformin. Her weight is decreasing steadily. She is following a generally unhealthy diet. When asked about meal planning, she reported none. She has not had a previous visit with a dietitian. She never participates in exercise. Home blood sugar record trend: She did not bring her logs however her meter shows average of 1 50 g/dL for the last 7 days. Her overall blood glucose range is 140-180 mg/dl. An ACE inhibitor/angiotensin II receptor blocker is not being taken. Eye exam is current.  Hyperlipidemia This is a chronic problem. The current episode started more than 1 year ago. The problem is uncontrolled. Recent  lipid tests were reviewed and are high. Exacerbating diseases include diabetes and obesity. Pertinent negatives include no chest pain, myalgias or shortness of breath. Current antihyperlipidemic treatment includes statins. Risk factors for coronary artery disease include diabetes mellitus, dyslipidemia, hypertension, obesity and a sedentary lifestyle.  Hypertension This is a chronic problem. The current episode started more than 1 year ago. Pertinent negatives include no chest pain, headaches, palpitations or shortness of breath. Risk factors for coronary artery disease include diabetes mellitus, dyslipidemia, obesity and sedentary lifestyle. Hypertensive end-organ damage includes CAD/MI and retinopathy.    Review of Systems  Constitutional: Positive for fatigue. Negative for fever, chills and unexpected weight change.  HENT: Negative for trouble swallowing and voice change.   Eyes: Negative for visual disturbance.  Respiratory: Negative for cough, shortness of breath and wheezing.   Cardiovascular: Negative for chest pain, palpitations and leg swelling.  Gastrointestinal: Negative for nausea, vomiting and diarrhea.  Endocrine: Negative for cold intolerance, heat intolerance, polydipsia, polyphagia and polyuria.  Musculoskeletal: Negative for myalgias and arthralgias.  Skin: Negative for color change, pallor, rash and wound.  Neurological: Negative for seizures and headaches.  Psychiatric/Behavioral: Negative for suicidal ideas and confusion.    Objective:    BP 132/70 mmHg  Pulse 87  Ht 5\' 6"  (1.676 m)  Wt 254 lb (115.214 kg)  BMI 41.02 kg/m2  SpO2 95%  Wt Readings from Last 3 Encounters:  05/14/15 254 lb (115.214 kg)  05/02/15 259 lb (117.482 kg)  05/09/15 259 lb (117.482 kg)    Physical Exam  Constitutional: She is oriented to person, place, and time. She appears well-developed.  HENT:  Head: Normocephalic and atraumatic.  She has very poor dentition.  Eyes: EOM are normal.   Neck: Normal range of motion. Neck supple. No tracheal deviation present. No thyromegaly present.  Cardiovascular: Normal rate and regular rhythm.   Pulmonary/Chest: Effort normal and breath sounds normal.  Abdominal: Soft. Bowel sounds are normal. There is no tenderness. There is no guarding.  Musculoskeletal: Normal range of motion. She exhibits no edema.  Neurological: She is alert and oriented to person, place, and time. She has normal reflexes. No cranial nerve deficit. Coordination normal.  Skin: Skin is warm and dry. No rash noted. No erythema. No pallor.  Psychiatric: She has a normal mood and affect. Judgment normal.    CMP     Component Value Date/Time   NA 141 06/30/2013  1540   K 4.8 06/30/2013 1540   CL 105 06/30/2013 1540   CO2 26 06/30/2013 1540   GLUCOSE 186* 06/30/2013 1540   BUN 8 06/30/2013 1540   CREATININE 0.66 06/30/2013 1540   CREATININE 0.62 04/06/2013 1602   CALCIUM 8.6 06/30/2013 1540   PROT 7.9 12/02/2012 1547   ALBUMIN 3.4* 12/02/2012 1547   AST 12 12/02/2012 1547   ALT 9 12/02/2012 1547   ALKPHOS 117 12/02/2012 1547   BILITOT 0.4 12/02/2012 1547   GFRNONAA >90 06/30/2013 1540   GFRAA >90 06/30/2013 1540   Diabetic Labs (most recent): Lab Results  Component Value Date   HGBA1C 8.6 03/22/2015   HGBA1C * 04/30/2007    8.1 (NOTE)   The ADA recommends the following therapeutic goals for glycemic   control related to Hgb A1C measurement:   Goal of Therapy:   < 7.0% Hgb A1C   Action Suggested:  > 8.0% Hgb A1C   Ref:  Diabetes Care, 22, Suppl. 1, 1999    Lipid Panel     Component Value Date/Time   CHOL  08/19/2006 0715    196        ATP III CLASSIFICATION:  <200     mg/dL   Desirable  200-239  mg/dL   Borderline High  >=240    mg/dL   High   TRIG 902 SLIGHT HEMOLYSIS* 08/19/2006 0715   HDL 47 SLIGHT HEMOLYSIS 08/19/2006 0715   CHOLHDL 4.2 08/19/2006 0715   VLDL UNABLE TO CALCULATE IF TRIGLYCERIDE OVER 400 mg/dL 08/19/2006 0715   LDLCALC   08/19/2006 0715    UNABLE TO CALCULATE IF TRIGLYCERIDE OVER 400 mg/dL        Total Cholesterol/HDL:CHD Risk Coronary Heart Disease Risk Table                     Men   Women  1/2 Average Risk   3.4   3.3     Assessment & Plan:   1. Type 2 diabetes mellitus with vascular disease ([place])  - Patient has currently uncontrolled symptomatic type 2 DM since  65 years of age,  with most recent A1c of 8.6 %. Recent labs reviewed.   Her diabetes is complicated by coronary artery disease status post stent placement and patient remains at a high risk for more acute and chronic complications of diabetes which include CAD, CVA, CKD, retinopathy, and neuropathy. These are all discussed in detail with the patient.  - I have counseled the patient on diet management and weight loss, by adopting a carbohydrate restricted/protein rich diet.  - Suggestion is made for patient to avoid simple carbohydrates   from their diet including Cakes , Desserts, Ice Cream,  Soda (  diet and regular) , Sweet Tea , Candies,  Chips, Cookies, Artificial Sweeteners,   and "Sugar-free" Products . This will help patient to have stable blood glucose profile and potentially avoid unintended weight gain.  - I encouraged the patient to switch to  unprocessed or minimally processed complex starch and increased protein intake (animal or plant source), fruits, and vegetables.  - Patient is advised to stick to a routine mealtimes to eat 3 meals  a day and avoid unnecessary snacks ( to snack only to correct hypoglycemia).  - The patient will be scheduled with Jearld Fenton, RDN, CDE for individualized DM education.  - I have approached patient with the following individualized plan to manage diabetes and patient agrees:   - I  will with basal insulin Lantus  50 units QHS, and lower prandial insulin Humalog to 10 units TIDAC for pre-meal BG readings of 90-150mg /dl, plus patient specific correction dose for unexpected hyperglycemia above  150mg /dl, associated with strict monitoring of glucose  AC and HS. - Patient is warned not to take insulin without proper monitoring per orders. -Adjustment parameters are given for hypo and hyperglycemia in writing. -Patient is encouraged to call clinic for blood glucose levels less than 70 or above 300 mg /dl. -She mentions allergy to a number of use for medications unfortunately, Victoza, Byetta, and metformin.  - I advised her to pick up her prescription for  Invokana 100mg  by mouth every morning. Side effects and precautions discussed with her.  - Patient specific target  A1c;  LDL, HDL, Triglycerides, and  Waist Circumference were discussed in detail.  2) BP/HTN: Controlled. Continue current medications . 3) Lipids/HPL:  Uncontrolled with triglycerides of 902, continue statins. Better control of diabetes will help for triglycerides. 4)  Weight/Diet: CDE Consult will be initiated , exercise, and detailed carbohydrates information provided.  5) Chronic Care/Health Maintenance:  -Patient is on Statin medications and encouraged to continue to follow up with Ophthalmology, Podiatrist at least yearly or according to recommendations, and advised to   stay away from smoking. I have recommended yearly flu vaccine and pneumonia vaccination at least every 5 years; moderate intensity exercise for up to 150 minutes weekly; and  sleep for at least 7 hours a day.  - 30 minutes of time was spent on the care of this patient , 50% of which was applied for counseling on diabetes complications and their preventions.  - Patient to bring meter and  blood glucose logs during their next visit.   - I advised patient to maintain close follow up with [name],DHRUV B., MD for primary care needs.  Follow up plan: - Return in about 8 weeks (around 07/09/2015) for diabetes, high blood pressure, high cholesterol, follow up with pre-visit labs, meter, and logs.  Glade Lloyd, MD Phone: [phone]  Fax: [phone]    05/14/2015, 11:57 AM

## 2015-05-17 ENCOUNTER — Telehealth: Payer: Self-pay

## 2015-05-17 NOTE — Telephone Encounter (Signed)
Pt states they have had low BG readings.   Date Before breakfast Before lunch Before supper Bedtime  2/27 87 146 85 135  2/28 69 170 90 105  3/1 73 125 83 82  3/2 53       Pt taking:  Lantus 50 units qam, Humalog 10-16 units TIDAC.    Pt states she has only taken the Humalog on 2-28 (10 units before lunch) & 3-1 (10 units before bedtime)

## 2015-05-18 LAB — TOXASSURE SELECT,+ANTIDEPR,UR: PDF: 0

## 2015-05-18 NOTE — Progress Notes (Signed)
Urine drug screen for this encounter is consistent for prescribed medication 

## 2015-05-21 NOTE — Telephone Encounter (Signed)
Please advise patient to lower her Lantus to 30 units daily at bedtime and lower Humalog to 5 units 3 times a day before meals plus correction.

## 2015-05-21 NOTE — Telephone Encounter (Signed)
Pt notified and agrees. 

## 2015-05-30 ENCOUNTER — Other Ambulatory Visit: Payer: Self-pay | Admitting: Registered Nurse

## 2015-06-07 ENCOUNTER — Ambulatory Visit (HOSPITAL_COMMUNITY)
Admission: RE | Admit: 2015-06-07 | Discharge: 2015-06-07 | Disposition: A | Discharge status: Home / Self Care | Payer: Medicaid Other | Source: Ambulatory Visit | Attending: Internal Medicine | Admitting: Internal Medicine

## 2015-06-07 ENCOUNTER — Encounter (HOSPITAL_COMMUNITY)
Admission: RE | Disposition: A | Discharge status: Home / Self Care | Payer: Self-pay | Source: Ambulatory Visit | Attending: Internal Medicine

## 2015-06-07 ENCOUNTER — Encounter (HOSPITAL_COMMUNITY): Payer: Self-pay

## 2015-06-07 DIAGNOSIS — I509 Heart failure, unspecified: Secondary | ICD-10-CM | POA: Insufficient documentation

## 2015-06-07 DIAGNOSIS — E1142 Type 2 diabetes mellitus with diabetic polyneuropathy: Secondary | ICD-10-CM | POA: Insufficient documentation

## 2015-06-07 DIAGNOSIS — R1310 Dysphagia, unspecified: Secondary | ICD-10-CM

## 2015-06-07 DIAGNOSIS — K449 Diaphragmatic hernia without obstruction or gangrene: Secondary | ICD-10-CM

## 2015-06-07 DIAGNOSIS — G4733 Obstructive sleep apnea (adult) (pediatric): Secondary | ICD-10-CM | POA: Diagnosis not present

## 2015-06-07 DIAGNOSIS — Z79899 Other long term (current) drug therapy: Secondary | ICD-10-CM | POA: Diagnosis not present

## 2015-06-07 DIAGNOSIS — F419 Anxiety disorder, unspecified: Secondary | ICD-10-CM | POA: Diagnosis not present

## 2015-06-07 DIAGNOSIS — Z7982 Long term (current) use of aspirin: Secondary | ICD-10-CM | POA: Insufficient documentation

## 2015-06-07 DIAGNOSIS — R112 Nausea with vomiting, unspecified: Secondary | ICD-10-CM | POA: Insufficient documentation

## 2015-06-07 DIAGNOSIS — K3189 Other diseases of stomach and duodenum: Secondary | ICD-10-CM | POA: Diagnosis not present

## 2015-06-07 DIAGNOSIS — K766 Portal hypertension: Secondary | ICD-10-CM | POA: Diagnosis not present

## 2015-06-07 DIAGNOSIS — Z794 Long term (current) use of insulin: Secondary | ICD-10-CM | POA: Diagnosis not present

## 2015-06-07 DIAGNOSIS — E785 Hyperlipidemia, unspecified: Secondary | ICD-10-CM | POA: Diagnosis not present

## 2015-06-07 DIAGNOSIS — K317 Polyp of stomach and duodenum: Secondary | ICD-10-CM | POA: Insufficient documentation

## 2015-06-07 DIAGNOSIS — J449 Chronic obstructive pulmonary disease, unspecified: Secondary | ICD-10-CM | POA: Insufficient documentation

## 2015-06-07 DIAGNOSIS — K219 Gastro-esophageal reflux disease without esophagitis: Secondary | ICD-10-CM | POA: Insufficient documentation

## 2015-06-07 DIAGNOSIS — R1319 Other dysphagia: Secondary | ICD-10-CM

## 2015-06-07 DIAGNOSIS — M1991 Primary osteoarthritis, unspecified site: Secondary | ICD-10-CM | POA: Insufficient documentation

## 2015-06-07 DIAGNOSIS — F329 Major depressive disorder, single episode, unspecified: Secondary | ICD-10-CM | POA: Diagnosis not present

## 2015-06-07 HISTORY — PX: ESOPHAGEAL DILATION: SHX303

## 2015-06-07 HISTORY — PX: ESOPHAGOGASTRODUODENOSCOPY: SHX5428

## 2015-06-07 SURGERY — EGD (ESOPHAGOGASTRODUODENOSCOPY)
Anesthesia: Moderate Sedation

## 2015-06-07 MED ORDER — MIDAZOLAM HCL 5 MG/5ML IJ SOLN
INTRAMUSCULAR | Status: DC | PRN
  Administered 2015-06-07 (×2): 2 mg via INTRAVENOUS
  Administered 2015-06-07 (×2): 3 mg via INTRAVENOUS

## 2015-06-07 MED ORDER — MEPERIDINE HCL 50 MG/ML IJ SOLN
INTRAMUSCULAR | Status: AC
  Filled 2015-06-07: qty 1

## 2015-06-07 MED ORDER — MEPERIDINE HCL 50 MG/ML IJ SOLN
INTRAMUSCULAR | Status: DC
Start: 2015-06-07 — End: 2015-06-07
  Filled 2015-06-07: qty 1

## 2015-06-07 MED ORDER — SODIUM CHLORIDE 0.9 % IV SOLN
INTRAVENOUS | Status: DC
  Administered 2015-06-07: 13:00:00 via INTRAVENOUS

## 2015-06-07 MED ORDER — PROMETHAZINE HCL 25 MG/ML IJ SOLN
INTRAMUSCULAR | Status: AC
  Filled 2015-06-07: qty 1

## 2015-06-07 MED ORDER — PROMETHAZINE HCL 25 MG/ML IJ SOLN
25.0000 mg | Freq: Once | INTRAMUSCULAR | Status: AC
  Administered 2015-06-07: 25 mg via INTRAVENOUS

## 2015-06-07 MED ORDER — MEPERIDINE HCL 50 MG/ML IJ SOLN
INTRAMUSCULAR | Status: DC | PRN
  Administered 2015-06-07 (×4): 25 mg via INTRAVENOUS

## 2015-06-07 MED ORDER — SODIUM CHLORIDE 0.9% FLUSH
INTRAVENOUS | Status: AC
  Filled 2015-06-07: qty 10

## 2015-06-07 MED ORDER — MIDAZOLAM HCL 5 MG/5ML IJ SOLN
INTRAMUSCULAR | Status: AC
  Filled 2015-06-07: qty 10

## 2015-06-07 MED ORDER — BUTAMBEN-TETRACAINE-BENZOCAINE 2-2-14 % EX AERO
INHALATION_SPRAY | CUTANEOUS | Status: DC | PRN
  Administered 2015-06-07: 2 via TOPICAL

## 2015-06-07 NOTE — Discharge Instructions (Signed)
Resume usual medications and diet. No driving for 24 hours. Please call office with progress report in one week.   Esophagogastroduodenoscopy, Care After Refer to this sheet in the next few weeks. These instructions provide you with information about caring for yourself after your procedure. Your health care provider may also give you more specific instructions. Your treatment has been planned according to current medical practices, but problems sometimes occur. Call your health care provider if you have any problems or questions after your procedure. WHAT TO EXPECT AFTER THE PROCEDURE After your procedure, it is typical to feel:  Soreness in your throat.  Pain with swallowing.  Sick to your stomach (nauseous).  Bloated.  Dizzy.  Fatigued. HOME CARE INSTRUCTIONS  Do not eat or drink anything until the numbing medicine (local anesthetic) has worn off and your gag reflex has returned. You will know that the local anesthetic has worn off when you can swallow comfortably.  Do not drive or operate machinery until directed by your health care provider.  Take medicines only as directed by your health care provider. SEEK MEDICAL CARE IF:   You cannot stop coughing.  You are not urinating at all or less than usual. SEEK IMMEDIATE MEDICAL CARE IF:  You have difficulty swallowing.  You cannot eat or drink.  You have worsening throat or chest pain.  You have dizziness or lightheadedness or you faint.  You have nausea or vomiting.  You have chills.  You have a fever.  You have severe abdominal pain.  You have black, tarry, or bloody stools.   This information is not intended to replace advice given to you by your health care provider. Make sure you discuss any questions you have with your health care provider.   Document Released: 02/18/2012 Document Revised: 03/24/2014 Document Reviewed: 02/18/2012 Elsevier Interactive Patient Education [employer].

## 2015-06-07 NOTE — Op Note (Signed)
[facility] Patient Name: [name] Procedure Date: 06/07/2015 1:44 PM MRN: [id number] Date of Birth: 04-12-1950 Attending MD: Hildred Laser , MD CSN: [id number] Age: 65 Admit Type: Outpatient Procedure:                Upper GI endoscopy Indications:              Esophageal dysphagia, Gastro-esophageal reflux                            disease Providers:                Hildred Laser, MD, Renda Rolls, RN, Isabella Stalling,                            Technician Referring MD:              Medicines:                Promethazine 25 mg IV, Meperidine 100 mg IV,                            Midazolam 10 mg IV Complications:            No immediate complications. Estimated Blood Loss:     Estimated blood loss: none. Procedure:                Pre-Anesthesia Assessment:                           - Prior to the procedure, a History and Physical                            was performed, and patient medications and                            allergies were reviewed. The patient's tolerance of                            previous anesthesia was also reviewed. The risks                            and benefits of the procedure and the sedation                            options and risks were discussed with the patient.                            All questions were answered, and informed consent                            was obtained. Prior Anticoagulants: The patient                            last took aspirin 3 days prior to the procedure.                            ASA Grade  Assessment: III - A patient with severe                            systemic disease. After reviewing the risks and                            benefits, the patient was deemed in satisfactory                            condition to undergo the procedure.                           After obtaining informed consent, the endoscope was                            passed under direct vision. Throughout the        procedure, the patient's blood pressure, pulse, and                            oxygen saturations were monitored continuously. The                            EG-[id number] ([id number]) scope was introduced through the                            mouth, and advanced to the second part of duodenum.                            The upper GI endoscopy was somewhat difficult due                            to the patient's anxiety. Successful completion of                            the procedure was aided by increasing the dose of                            sedation medication. The patient tolerated the                            procedure well. Scope In: 1:57:44 PM Scope Out: 2:11:10 PM Total Procedure Duration: 0 hours 13 minutes 26 seconds  Findings:      The gastroesophageal junction and examined esophagus were normal.      The Z-line was regular and was found 38 cm from the incisors.      A 2 cm hiatal hernia was present. esophagus dilated by passing 74 [place]       Maloney dilator      Mild portal hypertensive gastropathy was found in the gastric fundus and       in the gastric body.      A few 6 mm semi-sessile polyps with no bleeding and no stigmata of       recent bleeding were found in the gastric fundus.      The duodenal bulb was normal.  patchy mildly scalloped mucosa was found in the second portion of the       duodenum.      No endoscopic abnormality was evident in the esophagus to explain the       patient's complaint of dysphagia. It was decided, however, to proceed       with dilation of the entire esophagus. The scope was withdrawn. Dilation       was performed with a Maloney dilator with no resistance at 56 Fr. The       dilation site was examined following endoscope reinsertion and showed no       change. Impression:               - Normal gastroesophageal junction and esophagus.                           - Z-line regular, 38 cm from the incisors.                            - 2 cm hiatal hernia.                           - Portal hypertensive gastropathy.                           - A few gastric polyps. endoscopic appearance                            suggestive of hyperplastic polyps.                           - Normal duodenal bulb.                           - Scalloped mucosa was found in the duodenum.                           - No endoscopic esophageal abnormality to explain                            patient's dysphagia. Esophagus dilated. Dilated.                           - No specimens collected. Moderate Sedation:      Moderate (conscious) sedation was administered by the endoscopy nurse       and supervised by the endoscopist. The following parameters were       monitored: oxygen saturation, heart rate, blood pressure, CO2       capnography and response to care. Total physician intraservice time was       20 minutes. Recommendation:           - Patient has a contact number available for                            emergencies. The signs and symptoms of potential                            delayed  complications were discussed with the                            patient. Return to normal activities tomorrow.                            Written discharge instructions were provided to the                            patient.                           - Resume previous diet today.                           - Continue present medications.                           - Resume aspirin at prior dose today.                           - Return to my office in 6 months. Procedure Code(s):        --- Professional ---                           [id number], Esophagogastroduodenoscopy, flexible,                            transoral; diagnostic, including collection of                            specimen(s) by brushing or washing, when performed                            (separate procedure)                           43450, Dilation of esophagus, by unguided sound or                             bougie, single or multiple passes                           99152, Moderate sedation services provided by the                            same physician or other qualified health care                            professional performing the diagnostic or                            therapeutic service that the sedation supports,                            requiring the presence of an independent trained  observer to assist in the monitoring of the                            patient's level of consciousness and physiological                            status; initial 15 minutes of intraservice time,                            patient age 14 years or older Diagnosis Code(s):        --- Professional ---                           K44.9, Diaphragmatic hernia without obstruction or                            gangrene                           K76.6, Portal hypertension                           K31.89, Other diseases of stomach and duodenum                           K31.7, Polyp of stomach and duodenum                           R13.14, Dysphagia, pharyngoesophageal phase                           K21.9, Gastro-esophageal reflux disease without                            esophagitis CPT copyright 2016 American Medical Association. All rights reserved. The codes documented in this report are preliminary and upon coder review may  be revised to meet current compliance requirements. Hildred Laser, MD Hildred Laser, MD 06/07/2015 2:38:10 PM This report has been signed electronically. Number of Addenda: 0

## 2015-06-07 NOTE — H&P (Signed)
[name] is an 65 y.o. female.   Chief Complaint: Patient is here for EGD and ED. HPI: Patient is 65 year old Caucasian female with multiple medical problems who presents with one-year history of solid food dysphagia. Lately she's been having this symptom every day. She points to mid sternal area soft bolus obstruction. She feels heartburn is well controlled with therapy. She's had intermittent nausea and sporadic vomiting but no hematemesis. She says since she's been seen Dr.Nida for diabetic management she has lost 8 pounds. She usually has formed stools and she has been on gluten-free diet. She denies melena or frank rectal bleeding. She has blood in tissue with bowel movements at times.  Past Medical History  Diagnosis Date  . Palpitations   . Tachycardia   . CAD (coronary artery disease)     Stent placement circumflex coronary 2007, catheterization 2008 patent stents. Normal LV function  . Chest pain   . Diabetes mellitus     Insulin dependent  . Dyslipidemia   . Anxiety and depression   . Diabetic polyneuropathy (HCC)      severe on multiple medications  . 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 MI (myocardial infarction) ([place])     2007  . Anxiety   . Depression   . Obstructive sleep apnea     CPAP machine at night  . COPD (chronic obstructive pulmonary disease) ([place])   . GERD (gastroesophageal reflux disease)   . CHF (congestive heart failure) ([place])   . Headache(784.0)   . Arthritis     Past Surgical History  Procedure Laterality Date  . Coronary angioplasty with stent placement    . Colonoscopy  08/06/2011    Procedure: COLONOSCOPY;  Surgeon: Rogene Houston, MD;  Location: [facility];  Service: Endoscopy;  Laterality: N/A;  215  . Arm surgery Bilateral     due to fall-pt broke both forearms  . Colonoscopy with propofol N/A 12/31/2012    Procedure: COLONOSCOPY WITH PROPOFOL;  Surgeon: Rogene Houston, MD;  Location: [facility] ORS;  Service: Endoscopy;  Laterality: N/A;  in cecum at  0814, total withdrawal time 107mn  . Esophagogastroduodenoscopy (egd) with propofol N/A 12/31/2012    Procedure: ESOPHAGOGASTRODUODENOSCOPY (EGD) WITH PROPOFOL;  Surgeon: NRogene Houston MD;  Location: [facility] ORS;  Service: Endoscopy;  Laterality: N/A;  GE junction 37,  . Maloney dilation N/A 12/31/2012    Procedure: MVenia MinksDILATION;  Surgeon: NRogene Houston MD;  Location: [facility] ORS;  Service: Endoscopy;  Laterality: N/A;  used a # 54,#56  . Cholecystectomy    . Abdominal hysterectomy    . Esophagogastroduodenoscopy (egd) with propofol N/A 07/01/2013    Procedure: ESOPHAGOGASTRODUODENOSCOPY (EGD) WITH PROPOFOL;  Surgeon: NRogene Houston MD;  Location: [facility];  Service: Endoscopy;  Laterality: N/A;  950  . Maloney dilation N/A 07/01/2013    Procedure: MVenia MinksDILATION;  Surgeon: NRogene Houston MD;  Location: [facility] OR[facility];  Service: Endoscopy;  Laterality: N/A;  54/58; no heme present  . Biopsy N/A 07/01/2013    Procedure: BIOPSY;  Surgeon: NRogene Houston MD;  Location: [facility];  Service: Endoscopy;  Laterality: N/A;    Family History  Problem Relation Age of Onset  . Cancer    . Coronary artery disease     Social History:  reports that she has never smoked. She has never used smokeless tobacco. She reports that she does not drink alcohol or use illicit drugs.  Allergies:  Allergies  Allergen Reactions  .  Nitrofuran Derivatives Itching and Swelling  . Amphetamine-Dextroamphet Er Swelling  . Pregabalin Swelling  . Topiramate Other (See Comments)    Tongue tingle  . Verelan [Verapamil] Rash    Medications Prior to Admission  Medication Sig Dispense Refill  . ALPRAZolam (XANAX) 1 MG tablet Take 1 tablet (1 mg total) by mouth 4 (four) times daily. 30 tablet 3  . aspirin EC 81 MG tablet Take 81 mg by mouth daily.    Marland Kitchen atorvastatin (LIPITOR) 40 MG tablet Take 40 mg by mouth daily.    . beclomethasone (QVAR) 40 MCG/ACT inhaler Inhale 1 puff into the lungs 2 (two) times daily.    . canagliflozin  (INVOKANA) 100 MG TABS tablet Take 1 tablet (100 mg total) by mouth daily before breakfast. 30 tablet 2  . dicyclomine (BENTYL) 10 MG capsule TAKE 1 CAPSULE BY MOUTH TWICE DAILY AS NEEDED FOR SPASMS. 60 capsule 5  . estrogens, conjugated, (PREMARIN) 1.25 MG tablet Take 1.25 mg by mouth daily.     . fluticasone-salmeterol (ADVAIR HFA) 115-21 MCG/ACT inhaler Inhale 2 puffs into the lungs 2 (two) times daily.    Marland Kitchen gabapentin (NEURONTIN) 400 MG capsule Take 400 mg by mouth 3 (three) times daily.    Marland Kitchen ibuprofen (ADVIL,MOTRIN) 200 MG tablet Take 200 mg by mouth daily as needed for headache.    . Insulin Glargine (LANTUS SOLOSTAR) 100 UNIT/ML Solostar Pen Inject 50 Units into the skin daily with breakfast. (Patient taking differently: Inject 30 Units into the skin daily with breakfast. ) 15 mL 3  . insulin lispro (HUMALOG KWIKPEN) 100 UNIT/ML KiwkPen Inject 0.15-0.21 mLs (15-21 Units total) into the skin 3 (three) times daily with meals. (Patient taking differently: Inject 5 Units into the skin 3 (three) times daily with meals. ) 15 mL 3  . Insulin Pen Needle (B-D ULTRAFINE III SHORT PEN) 31G X 8 MM MISC 1 each by Does not apply route as directed. 100 each 3  . lidocaine (XYLOCAINE) 5 % ointment Apply 1 application topically at bedtime.     . ondansetron (ZOFRAN) 4 MG tablet Take 4 mg by mouth every 6 (six) hours as needed for nausea or vomiting.    Marland Kitchen oxymorphone (OPANA ER) 30 MG 12 hr tablet Take 1 tablet (30 mg total) by mouth every 12 (twelve) hours. 60 tablet 0  . pantoprazole (PROTONIX) 40 MG tablet TAKE 1 TABLET BY MOUTH TWICE DAILY BEFORE A MEAL. 60 tablet 5  . potassium chloride (K-DUR,KLOR-CON) 10 MEQ tablet TAKE 2 TABLETS BY MOUTH 2 TIMES DAILY. 120 tablet 6  . psyllium (METAMUCIL SMOOTH TEXTURE) 28 % packet Take 1 packet by mouth at bedtime.    . ranitidine (ZANTAC) 300 MG tablet Take 300 mg by mouth 2 (two) times daily.    . TOPROL XL 50 MG 24 hr tablet TAKE 1 TABLET BY MOUTH DAILY WITH OR  IMMEDIATELY FOLLOWING A MEAL. 30 tablet 6  . traMADol (ULTRAM) 50 MG tablet Take 1 tablet (50 mg total) by mouth 2 (two) times daily. 60 tablet 3  . VOLTAREN 1 % GEL APPLY 2 GRAMS ON BOTH KNEES THREE TIMES DAILY 300 g 2  . glucose blood (ACCU-CHEK AVIVA) test strip Use to test 4 times a day 150 each 3  . nitroGLYCERIN (NITROSTAT) 0.4 MG SL tablet Place 1 tablet (0.4 mg total) under the tongue every 5 (five) minutes x 3 doses as needed. For chest pains 25 tablet 3  . traZODone (DESYREL) 25 mg TABS tablet Take  0.5 tablets (25 mg total) by mouth at bedtime. (Patient taking differently: Take 50 mg by mouth at bedtime. ) 30 tablet 2    No results found for this or any previous visit (from the past 48 hour(s)). No results found.  ROS  Blood pressure 128/72, pulse 94, temperature 98 F (36.7 C), temperature source Oral, resp. rate 10, height 5' 3"  (1.6 m), weight 259 lb (117.482 kg), SpO2 93 %. Physical Exam  Constitutional:  Well-developed obese Caucasian female in NAD.  HENT:  Mouth/Throat: Oropharynx is clear and moist.  Eyes: Conjunctivae are normal. No scleral icterus.  Neck: No thyromegaly present.  Cardiovascular: Normal rate, regular rhythm and normal heart sounds.   No murmur heard. Respiratory: Effort normal and breath sounds normal.  GI:  Abdomen is full. It is soft with mild tenderness across upper abdomen. No organomegaly or masses.  Musculoskeletal: She exhibits no edema.  Lymphadenopathy:    She has no cervical adenopathy.  Neurological: She is alert.  Skin: Skin is warm and dry.     Assessment/Plan Solid food dysphagia. Chronic GERD. EGD with ED.  Rogene Houston, MD 06/07/2015, 1:43 PM

## 2015-06-08 ENCOUNTER — Ambulatory Visit: Payer: Medicaid Other | Admitting: Physical Medicine & Rehabilitation

## 2015-06-08 LAB — GLUCOSE, CAPILLARY: Glucose-Capillary: 141 mg/dL — ABNORMAL HIGH (ref 65–99)

## 2015-06-11 ENCOUNTER — Ambulatory Visit (HOSPITAL_BASED_OUTPATIENT_CLINIC_OR_DEPARTMENT_OTHER): Payer: Medicaid Other | Admitting: Physical Medicine & Rehabilitation

## 2015-06-11 ENCOUNTER — Encounter: Payer: Medicaid Other | Attending: Physical Medicine & Rehabilitation

## 2015-06-11 ENCOUNTER — Encounter: Payer: Self-pay | Admitting: Physical Medicine & Rehabilitation

## 2015-06-11 VITALS — BP 123/53 | HR 87

## 2015-06-11 DIAGNOSIS — E1140 Type 2 diabetes mellitus with diabetic neuropathy, unspecified: Secondary | ICD-10-CM | POA: Diagnosis not present

## 2015-06-11 DIAGNOSIS — Z8669 Personal history of other diseases of the nervous system and sense organs: Secondary | ICD-10-CM

## 2015-06-11 DIAGNOSIS — M1990 Unspecified osteoarthritis, unspecified site: Secondary | ICD-10-CM | POA: Diagnosis not present

## 2015-06-11 DIAGNOSIS — Z1401 Asymptomatic hemophilia A carrier: Secondary | ICD-10-CM | POA: Diagnosis not present

## 2015-06-11 DIAGNOSIS — G4733 Obstructive sleep apnea (adult) (pediatric): Secondary | ICD-10-CM | POA: Diagnosis not present

## 2015-06-11 DIAGNOSIS — M47816 Spondylosis without myelopathy or radiculopathy, lumbar region: Secondary | ICD-10-CM | POA: Diagnosis not present

## 2015-06-11 DIAGNOSIS — G8929 Other chronic pain: Secondary | ICD-10-CM | POA: Insufficient documentation

## 2015-06-11 DIAGNOSIS — E1142 Type 2 diabetes mellitus with diabetic polyneuropathy: Secondary | ICD-10-CM | POA: Diagnosis not present

## 2015-06-11 DIAGNOSIS — R079 Chest pain, unspecified: Secondary | ICD-10-CM | POA: Diagnosis not present

## 2015-06-11 DIAGNOSIS — F418 Other specified anxiety disorders: Secondary | ICD-10-CM | POA: Insufficient documentation

## 2015-06-11 DIAGNOSIS — Z87898 Personal history of other specified conditions: Secondary | ICD-10-CM | POA: Diagnosis not present

## 2015-06-11 DIAGNOSIS — K219 Gastro-esophageal reflux disease without esophagitis: Secondary | ICD-10-CM | POA: Diagnosis not present

## 2015-06-11 DIAGNOSIS — I252 Old myocardial infarction: Secondary | ICD-10-CM | POA: Insufficient documentation

## 2015-06-11 DIAGNOSIS — M79605 Pain in left leg: Secondary | ICD-10-CM | POA: Insufficient documentation

## 2015-06-11 DIAGNOSIS — I509 Heart failure, unspecified: Secondary | ICD-10-CM | POA: Diagnosis not present

## 2015-06-11 DIAGNOSIS — R51 Headache: Secondary | ICD-10-CM | POA: Diagnosis not present

## 2015-06-11 DIAGNOSIS — M4806 Spinal stenosis, lumbar region: Secondary | ICD-10-CM

## 2015-06-11 DIAGNOSIS — Z760 Encounter for issue of repeat prescription: Secondary | ICD-10-CM | POA: Diagnosis not present

## 2015-06-11 DIAGNOSIS — M5489 Other dorsalgia: Secondary | ICD-10-CM | POA: Diagnosis not present

## 2015-06-11 DIAGNOSIS — G4700 Insomnia, unspecified: Secondary | ICD-10-CM | POA: Diagnosis not present

## 2015-06-11 DIAGNOSIS — M79604 Pain in right leg: Secondary | ICD-10-CM | POA: Insufficient documentation

## 2015-06-11 DIAGNOSIS — E785 Hyperlipidemia, unspecified: Secondary | ICD-10-CM | POA: Diagnosis not present

## 2015-06-11 DIAGNOSIS — R002 Palpitations: Secondary | ICD-10-CM | POA: Insufficient documentation

## 2015-06-11 DIAGNOSIS — I2510 Atherosclerotic heart disease of native coronary artery without angina pectoris: Secondary | ICD-10-CM | POA: Diagnosis not present

## 2015-06-11 DIAGNOSIS — J449 Chronic obstructive pulmonary disease, unspecified: Secondary | ICD-10-CM | POA: Insufficient documentation

## 2015-06-11 DIAGNOSIS — M48061 Spinal stenosis, lumbar region without neurogenic claudication: Secondary | ICD-10-CM

## 2015-06-11 DIAGNOSIS — Z794 Long term (current) use of insulin: Secondary | ICD-10-CM | POA: Diagnosis not present

## 2015-06-11 DIAGNOSIS — R000 Tachycardia, unspecified: Secondary | ICD-10-CM | POA: Diagnosis not present

## 2015-06-11 DIAGNOSIS — M47896 Other spondylosis, lumbar region: Secondary | ICD-10-CM | POA: Diagnosis not present

## 2015-06-11 MED ORDER — OXYMORPHONE HCL ER 30 MG PO TB12: 30.0000 mg | ORAL_TABLET | Freq: Two times a day (BID) | ORAL | Status: DC

## 2015-06-11 NOTE — Patient Instructions (Addendum)
The sleep specialist is at [facility] they will call you to set up an appointment with Dr. Brett Fairy       Capsaicin topical cream, lotion, solution What is this medicine? CAPSAICIN (cap SAY sin) is a pain reliever. It is used to treat pain in muscles or joints. This medicine may be used for other purposes; ask your health care provider or pharmacist if you have questions. What should I tell my health care provider before I take this medicine? They need to know if you have any of these conditions: -broken or irritated skin -an unusual or allergic reaction to capsaicin, hot peppers, other medicines, foods, dyes, or preservatives -pregnant or trying to get pregnant -breast-feeding How should I use this medicine? Use this medicine on the skin. Do not take by mouth. Follow the directions on the package or prescription label. Rub into painful area until there is little or no visible medicine left on the skin surface. Wash hands with soap and water after applying. If you are using this medicine for pain in the hands, do not wash your hands for at least 30 minutes after using this medicine. After using this medicine do not use a bandage, a heating pad, or expose the affected area to direct sun. Use your medicine at regular intervals. Do not use your medicine more often than directed. Talk to your pediatrician regarding the use of this medicine in children. Special care may be needed. Overdosage: If you think you have taken too much of this medicine contact a poison control center or emergency room at once. NOTE: This medicine is only for you. Do not share this medicine with others. What if I miss a dose? If you miss a dose, use it as soon as you can. If it is almost time for your next dose, use only that dose. Do not use double or extra doses. What may interact with this medicine? Interactions are not expected. Do not use any other skin products on the affected area without asking your  doctor or health care professional. This list may not describe all possible interactions. Give your health care provider a list of all the medicines, herbs, non-prescription drugs, or dietary supplements you use. Also tell them if you smoke, drink alcohol, or use illegal drugs. Some items may interact with your medicine. What should I watch for while using this medicine? Tell your doctor or healthcare professional if your symptoms do not start to get better or if they get worse. What side effects may I notice from receiving this medicine? Side effects that you should report to your doctor or health care professional as soon as possible: -allergic reactions like skin rash, itching or hives, swelling of the face, lips, or tongue -burning pain, redness that does not go away -cough -skin sores or thinning Side effects that usually do not require medical attention (report to your doctor or health care professional if they continue or are bothersome): -mild stinging or warmth where used This list may not describe all possible side effects. Call your doctor for medical advice about side effects. You may report side effects to FDA at 1-800-FDA-1088. Where should I keep my medicine? Keep out of the reach of young children. Store at room temperature, between 15 and 30 degrees C (59 and 86 degrees F). Do not freeze. Protect from light. Throw away any unused medicine after the expiration date. NOTE: This sheet is a summary. It may not cover all possible information. If you have  questions about this medicine, talk to your doctor, pharmacist, or health care provider.    2016, Elsevier/Gold Standard. (2011-02-17 16:38:28)

## 2015-06-11 NOTE — Progress Notes (Signed)
Subjective:    Patient ID: [name], female    DOB: 03-11-1951, 65 y.o.   MRN: [id number]  HPI 65 year old female with chronic low back pain, lumbar spinal stenosis as well as diabetic neuropathy. She was last seen by nurse practitioner one month ago. In the interval time she has had esophageal dilation and has felt ill since that time. In regards to her pain complaints she states that her back pain and her foot pain are of bout of equal magnitude. At night her foot pain bothers her particularly in the toes with a tingling and burning pain. She continues to take gabapentin 400 mg 3 times per day We discussed other treatment options including capsaicin cream  She uses Opana 30 mg twice a day Tramadol 50 mg twice a day  Poor sleep noted, husband states she snores. She has been on CPAP in the past was taken off of this because she lost some weight but now has regained her weight back. No longer sees the doctor that did a sleep study on her he is no longer in her area.  Pain Inventory Average Pain 8 Pain Right Now 7 My pain is sharp, burning, dull, stabbing, tingling and aching  In the last 24 hours, has pain interfered with the following? General activity 9 Relation with others 8 Enjoyment of life 8 What TIME of day is your pain at its worst? evening, night Sleep (in general) Poor  Pain is worse with: walking, sitting, inactivity, standing and some activites Pain improves with: rest and medication Relief from Meds: 3  Mobility walk with assistance use a cane use a walker how many minutes can you walk? 5 ability to climb steps?  no do you drive?  no Do you have any goals in this area?  yes  Function I need assistance with the following:  dressing, bathing, household duties and shopping Do you have any goals in this area?  yes  Neuro/Psych bladder control problems bowel control problems weakness numbness tingling trouble  walking dizziness confusion depression anxiety loss of taste or smell  Prior Studies Any changes since last visit?  no  Physicians involved in your care Any changes since last visit?  yes   Family History  Problem Relation Age of Onset  . Cancer    . Coronary artery disease     Social History   Social History  . Marital Status: Legally Separated    Spouse Name: N/A  . Number of Children: N/A  . Years of Education: N/A   Occupational History  . Disabled    Social History Main Topics  . Smoking status: Never Smoker   . Smokeless tobacco: Never Used  . Alcohol Use: No  . Drug Use: No  . Sexual Activity: Not Asked   Other Topics Concern  . None   Social History Narrative   Patient does not get regular exercise   Past Surgical History  Procedure Laterality Date  . Coronary angioplasty with stent placement    . Colonoscopy  08/06/2011    Procedure: COLONOSCOPY;  Surgeon: Rogene Houston, MD;  Location: [facility];  Service: Endoscopy;  Laterality: N/A;  215  . Arm surgery Bilateral     due to fall-pt broke both forearms  . Colonoscopy with propofol N/A 12/31/2012    Procedure: COLONOSCOPY WITH PROPOFOL;  Surgeon: Rogene Houston, MD;  Location: [facility];  Service: Endoscopy;  Laterality: N/A;  in cecum at 0814, total withdrawal time 22mn  .  Esophagogastroduodenoscopy (egd) with propofol N/A 12/31/2012    Procedure: ESOPHAGOGASTRODUODENOSCOPY (EGD) WITH PROPOFOL;  Surgeon: Rogene Houston, MD;  Location: [facility];  Service: Endoscopy;  Laterality: N/A;  GE junction 37,  . Maloney dilation N/A 12/31/2012    Procedure: Venia Minks DILATION;  Surgeon: Rogene Houston, MD;  Location: [facility];  Service: Endoscopy;  Laterality: N/A;  used a # 54,#56  . Cholecystectomy    . Abdominal hysterectomy    . Esophagogastroduodenoscopy (egd) with propofol N/A 07/01/2013    Procedure: ESOPHAGOGASTRODUODENOSCOPY (EGD) WITH PROPOFOL;  Surgeon: Rogene Houston, MD;  Location: [facility];   Service: Endoscopy;  Laterality: N/A;  950  . Maloney dilation N/A 07/01/2013    Procedure: Venia Minks DILATION;  Surgeon: Rogene Houston, MD;  Location: [facility];  Service: Endoscopy;  Laterality: N/A;  54/58; no heme present  . Biopsy N/A 07/01/2013    Procedure: BIOPSY;  Surgeon: Rogene Houston, MD;  Location: [facility];  Service: Endoscopy;  Laterality: N/A;   Past Medical History  Diagnosis Date  . Palpitations   . Tachycardia   . CAD (coronary artery disease)     Stent placement circumflex coronary 2007, catheterization 2008 patent stents. Normal LV function  . Chest pain   . Diabetes mellitus     Insulin dependent  . Dyslipidemia   . Anxiety and depression   . Diabetic polyneuropathy (HCC)      severe on multiple medications  . 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 MI (myocardial infarction) ([place])     2007  . Anxiety   . Depression   . Obstructive sleep apnea     CPAP machine at night  . COPD (chronic obstructive pulmonary disease) ([place])   . GERD (gastroesophageal reflux disease)   . CHF (congestive heart failure) ([place])   . Headache(784.0)   . Arthritis    BP 123/53 mmHg  Pulse 87  SpO2 92%  Opioid Risk Score:   Fall Risk Score:  `1  Depression screen PHQ 2/9  Depression screen [facility] 2/9 05/14/2015 05/14/2015 05/02/2015 12/11/2014 11/13/2014 10/12/2014  Decreased Interest 0 0 0 3 3 3   Down, Depressed, Hopeless 0 0 0 3 3 3   PHQ - 2 Score 0 0 0 6 6 6   Altered sleeping - - - - - 2  Tired, decreased energy - - - - - 3  Change in appetite - - - - - 3  Feeling bad or failure about yourself  - - - - - 0  Trouble concentrating - - - - - 0  Moving slowly or fidgety/restless - - - - - 0  Suicidal thoughts - - - - - 0  PHQ-9 Score - - - - - 14    Review of Systems  Constitutional: Positive for appetite change.  Respiratory: Positive for shortness of breath.   Gastrointestinal: Positive for nausea, vomiting, abdominal pain, diarrhea and constipation.  Genitourinary: Positive for dysuria and  difficulty urinating.  All other systems reviewed and are negative.      Objective:   Physical Exam  Constitutional: She is oriented to person, place, and time. She appears well-developed and well-nourished.  HENT:  Head: Normocephalic and atraumatic.  Eyes: Conjunctivae and EOM are normal. Pupils are equal, round, and reactive to light.  Neck: Normal range of motion.  Musculoskeletal:       Cervical back: She exhibits tenderness.  Neurological: She is alert and oriented to person, place, and time.  Psychiatric: She has a normal  mood and affect.  Nursing note and vitals reviewed.    Decreased proprioception right great toe intact at the ankle Intact proprioception left great toe Sensation intact to light touch at the left medial malleolus absent in the toes bilaterally absent in the right medial malleolus, Right knee No evidence of skin lesions in the feet Intrinsic muscle atrophy both feet    Assessment & Plan:  1.  Chronic low back pain and lumbar spinal stenosis Continue Opana 30 mg twice a day Would not recommend any increase in Opana dosage. This is already 90 mg of morphine equivalence. If patient desires a change of medicine, Nucynta may be a good option given the neuropathic component of her pain Tramadol 50 mg twice a day  Has tried lumbar medial branch blocks in 2014 without much improvement. No need to repeat  2. Diabetic neuropathy continue strict diabetic management Continue gabapentin 400 mg 3 times per day Try Capsaician cream  3. History of sleep apnea lost weight was taken off of CPAP and then gained the weight back and has more symptoms. I think she needs another sleep evaluation will refer to [facility] neurology Dr. Brett Fairy  4. Fibromyalgia syndrome previously has been diagnosed. May need to increase her gabapentin to 600 mg 3 times a day however she states that even when she was on 800 mg in the past this is not particular helpful. May consider other  medications such as Lyrica, Does have this listed as a intolerance or allergy but the reaction is swelling which may be a normal side effect. Recommend walking on a regular basis. Patient states she is limited by her diabetic foot pain Another option would be Cymbalta 30 mg per day

## 2015-06-13 ENCOUNTER — Encounter (HOSPITAL_COMMUNITY): Payer: Self-pay | Admitting: Internal Medicine

## 2015-06-18 ENCOUNTER — Encounter: Payer: Self-pay | Admitting: Neurology

## 2015-06-18 ENCOUNTER — Ambulatory Visit (INDEPENDENT_AMBULATORY_CARE_PROVIDER_SITE_OTHER): Payer: Medicaid Other | Admitting: Neurology

## 2015-06-18 VITALS — BP 138/72 | HR 78 | Resp 18 | Ht 66.0 in | Wt 248.0 lb

## 2015-06-18 DIAGNOSIS — M7989 Other specified soft tissue disorders: Secondary | ICD-10-CM | POA: Diagnosis not present

## 2015-06-18 DIAGNOSIS — R51 Headache: Secondary | ICD-10-CM | POA: Diagnosis not present

## 2015-06-18 DIAGNOSIS — R351 Nocturia: Secondary | ICD-10-CM

## 2015-06-18 DIAGNOSIS — G2581 Restless legs syndrome: Secondary | ICD-10-CM | POA: Diagnosis not present

## 2015-06-18 DIAGNOSIS — G4733 Obstructive sleep apnea (adult) (pediatric): Secondary | ICD-10-CM | POA: Diagnosis not present

## 2015-06-18 DIAGNOSIS — E0842 Diabetes mellitus due to underlying condition with diabetic polyneuropathy: Secondary | ICD-10-CM

## 2015-06-18 DIAGNOSIS — R519 Headache, unspecified: Secondary | ICD-10-CM

## 2015-06-18 NOTE — Patient Instructions (Signed)
Based on your symptoms and your exam I believe you are still at risk for obstructive sleep apnea or OSA, and I think we should proceed with a sleep study to determine how severe it is. If you have more than mild OSA, I want you to consider treatment with CPAP. Please remember, the risks and ramifications of moderate to severe obstructive sleep apnea or OSA are: Cardiovascular disease, including congestive heart failure, stroke, difficult to control hypertension, arrhythmias, and even type 2 diabetes has been linked to untreated OSA. Sleep apnea causes disruption of sleep and sleep deprivation in most cases, which, in turn, can cause recurrent headaches, problems with memory, mood, concentration, focus, and vigilance. Most people with untreated sleep apnea report excessive daytime sleepiness, which can affect their ability to drive. Please do not drive if you feel sleepy.   I will likely see you back after your sleep study to go over the test results and where to go from there. We will call you after your sleep study to advise about the results (most likely, you will hear from Diana, my nurse) and to set up an appointment at the time, as necessary.    Our sleep lab administrative assistant, Dawn will meet with you or call you to schedule your sleep study. If you don't hear back from her by next week please feel free to call her at [phone]. This is her direct line and please leave a message with your phone number to call back if you get the voicemail box. She will call back as soon as possible.   

## 2015-06-18 NOTE — Progress Notes (Signed)
Subjective:    Patient ID: [name] is a 65 y.o. female.  HPI     Star Age, MD, PhD [facility] [address], [address] [place], [place] [address]  Dear Dr. Letta Pate,   I saw your patient, [name], upon your kind request in my neurologic clinic today for initial consultation of her sleep disorder, in particular reevaluation of her prior diagnosis of OSA. The patient is accompanied by her BF, Joe, today. As you know, [name] is a 65 year old right-handed woman with an underlying complex medical history of coronary artery disease, status post stent placement in 2007, diabetes, with history of painful diabetic neuropathy, lumbar spinal stenosis with chronic back pain, COPD, reflux disease, CHF, arthritis, hyperlipidemia, palpitations, anxiety, depression, and morbid obesity, who was previously diagnosed with obstructive sleep apnea and placed on CPAP therapy. She stopped using CPAP after losing weight, however, she has regained most of her weight. I reviewed sleep study test results from [facility] from 10/28/2013. Sleep efficiency was 46.4%, there was absence of REM sleep. It was absence of slow-wave sleep. She had 98.5% of stage II sleep.  I reviewed a sleep study report from 06/06/2009, her sleep study w CPAP was conducted and [facility]. Sleep efficiency was 89%. Sleep latency 8.5 minutes. There was absence of REM sleep and absence of slow-wave sleep. She had 96% of stage II sleep. AHI was 3.9 per hour, supine AHI was 6.3 per hour. This appears to be a CPAP titration study, CPAP was titrated from 5 cm to 13 cm.  I reviewed his sleep study from 05/31/2009. Sleep efficiency was 89%, there was absence of REM sleep and absence of slow-wave sleep, she had stage II sleep at 97.7%. Total AHI was 20.4 per hour. Average oxygen saturation was 90.5%, nadir was 73.9%.  It looks like she was placed on CPAP therapy at 13 cm of water pressure.   Her previous sleep doctor moved away from the area. She is on chronic narcotic pain medication. In addition, she is on multiple other potentially sedating medications including Xanax, gabapentin, tramadol and trazodone. Her complete medication list is as below. I reviewed your office note from 06/11/2015. I reviewed her CPAP compliance data from 10/06/2013 through 11/04/2013 which is a total of 30 days during which time she used her machine 29 days with percent used days greater than 4 hours at 80%, indicating good compliance with an average usage of 5 hours and 20 minutes, residual AHI borderline at 6.6 on a pressure of 12 cm. She has not used her CPAP in over a year and a half as she lost weight. She reports excessive daytime somnolence and snoring, witnessed apneas. Her Epworth sleepiness score is 22 out of 24 today, her fatigue score is 62/63.  She also reports restless legs symptoms. She has numbness and tingling in her legs. She feels symptoms are up to her thighs bilaterally. She goes to bed around 8 or 8:30 PM. Wakeup time is around 7:30. Her CNA comes in at 8 to help her with cooking, cleaning and bathing. She has nocturia about 5 times a night. She has nearly daily headaches in the mornings, dull and achy typically. She makes snorting sounds at night. She lives with her boyfriend, she has grown children. She does not smoke or drink alcohol or use illicit drugs. She does not typically use caffeine. She does not work. She takes gabapentin, 400 mg 3 times a day, Xanax, 1 mg  strength one pill 3-4 times a day, tramadol as needed, trazodone 50 mg each night. She has started seeing an endocrinologist. In the past 2 months she has lost about 20 pounds. She is trying to lose weight. Her latest A1c was 8.4 she recalls. She has blood work pending for later this month and a follow-up with her endocrinologist as well.  Her Past Medical History Is Significant For: Past Medical History  Diagnosis Date  .  Palpitations   . Tachycardia   . CAD (coronary artery disease)     Stent placement circumflex coronary 2007, catheterization 2008 patent stents. Normal LV function  . Chest pain   . Diabetes mellitus     Insulin dependent  . Dyslipidemia   . Anxiety and depression   . Diabetic polyneuropathy (HCC)      severe on multiple medications  . 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 MI (myocardial infarction) ([place])     2007  . Anxiety   . Depression   . Obstructive sleep apnea     CPAP machine at night  . COPD (chronic obstructive pulmonary disease) ([place])   . GERD (gastroesophageal reflux disease)   . CHF (congestive heart failure) ([place])   . Headache(784.0)   . Arthritis     Her Past Surgical History Is Significant For: Past Surgical History  Procedure Laterality Date  . Coronary angioplasty with stent placement    . Colonoscopy  08/06/2011    Procedure: COLONOSCOPY;  Surgeon: Rogene Houston, MD;  Location: [facility];  Service: Endoscopy;  Laterality: N/A;  215  . Arm surgery Bilateral     due to fall-pt broke both forearms  . Colonoscopy with propofol N/A 12/31/2012    Procedure: COLONOSCOPY WITH PROPOFOL;  Surgeon: Rogene Houston, MD;  Location: [facility] ORS;  Service: Endoscopy;  Laterality: N/A;  in cecum at 0814, total withdrawal time 39min  . Esophagogastroduodenoscopy (egd) with propofol N/A 12/31/2012    Procedure: ESOPHAGOGASTRODUODENOSCOPY (EGD) WITH PROPOFOL;  Surgeon: Rogene Houston, MD;  Location: [facility] OR[facility];  Service: Endoscopy;  Laterality: N/A;  GE junction 37,  . Maloney dilation N/A 12/31/2012    Procedure: Venia Minks DILATION;  Surgeon: Rogene Houston, MD;  Location: [facility] OR[facility];  Service: Endoscopy;  Laterality: N/A;  used a # 54,#56  . Cholecystectomy    . Abdominal hysterectomy    . Esophagogastroduodenoscopy (egd) with propofol N/A 07/01/2013    Procedure: ESOPHAGOGASTRODUODENOSCOPY (EGD) WITH PROPOFOL;  Surgeon: Rogene Houston, MD;  Location: [facility];  Service: Endoscopy;  Laterality:  N/A;  950  . Maloney dilation N/A 07/01/2013    Procedure: Venia Minks DILATION;  Surgeon: Rogene Houston, MD;  Location: [facility] OR[facility];  Service: Endoscopy;  Laterality: N/A;  54/58; no heme present  . Biopsy N/A 07/01/2013    Procedure: BIOPSY;  Surgeon: Rogene Houston, MD;  Location: [facility];  Service: Endoscopy;  Laterality: N/A;  . Esophagogastroduodenoscopy N/A 06/07/2015    Procedure: ESOPHAGOGASTRODUODENOSCOPY (EGD);  Surgeon: Rogene Houston, MD;  [facility];  Service: Endoscopy;  Laterality: N/A;  2:45 - moved to 1:00 - Ann to notify pt  . Esophageal dilation N/A 06/07/2015    Procedure: ESOPHAGEAL DILATION;  Surgeon: Rogene Houston, MD;  [facility];  Service: Endoscopy;  Laterality: N/A;    Her Family History Is Significant For: Family History  Problem Relation Age of Onset  . Cancer    . Coronary artery disease    . Heart attack  Mother   . Colon cancer Mother   . Heart attack Father   . Stroke Sister   . Hemophilia Child   . Cancer Brother   . Cancer Maternal Aunt   . Cancer Maternal Uncle   . Cancer Paternal Aunt   . Cancer Paternal Uncle   . Cancer Brother     Her Social History Is Significant For: Social History   Social History  . Marital Status: Legally Separated    Spouse Name: N/A  . Number of Children: 4  . Years of Education: N/A   Occupational History  . Disabled    Social History Main Topics  . Smoking status: Never Smoker   . Smokeless tobacco: Never Used  . Alcohol Use: No  . Drug Use: No  . Sexual Activity: Not Asked   Other Topics Concern  . None   Social History Narrative   Patient does not get regular exercise   Denies caffeine use     Her Allergies Are:  Allergies  Allergen Reactions  . Nitrofuran Derivatives Itching and Swelling  . Amphetamine-Dextroamphet Er Swelling  . Pregabalin Swelling  . Topiramate Other (See Comments)    Tongue tingle  . Verelan [Verapamil] Rash  :   Her Current Medications Are:   Outpatient Encounter Prescriptions as of 06/18/2015  Medication Sig  . ALPRAZolam (XANAX) 1 MG tablet Take 1 tablet (1 mg total) by mouth 4 (four) times daily.  Marland Kitchen aspirin EC 81 MG tablet Take 81 mg by mouth daily.  Marland Kitchen atorvastatin (LIPITOR) 40 MG tablet Take 40 mg by mouth daily.  . beclomethasone (QVAR) 40 MCG/ACT inhaler Inhale 1 puff into the lungs 2 (two) times daily.  . canagliflozin (INVOKANA) 100 MG TABS tablet Take 1 tablet (100 mg total) by mouth daily before breakfast.  . dicyclomine (BENTYL) 10 MG capsule TAKE 1 CAPSULE BY MOUTH TWICE DAILY AS NEEDED FOR SPASMS.  Marland Kitchen estrogens, conjugated, (PREMARIN) 1.25 MG tablet Take 1.25 mg by mouth daily.   . fluticasone-salmeterol (ADVAIR HFA) 115-21 MCG/ACT inhaler Inhale 2 puffs into the lungs 2 (two) times daily.  Marland Kitchen gabapentin (NEURONTIN) 400 MG capsule Take 400 mg by mouth 3 (three) times daily.  Marland Kitchen glucose blood (ACCU-CHEK AVIVA) test strip Use to test 4 times a day  . ibuprofen (ADVIL,MOTRIN) 200 MG tablet Take 200 mg by mouth daily as needed for headache.  . Insulin Glargine (LANTUS SOLOSTAR) 100 UNIT/ML Solostar Pen Inject 50 Units into the skin daily with breakfast. (Patient taking differently: Inject 30 Units into the skin daily with breakfast. )  . insulin lispro (HUMALOG KWIKPEN) 100 UNIT/ML KiwkPen Inject 0.15-0.21 mLs (15-21 Units total) into the skin 3 (three) times daily with meals. (Patient taking differently: Inject 5 Units into the skin 3 (three) times daily with meals. )  . Insulin Pen Needle (B-D ULTRAFINE III SHORT PEN) 31G X 8 MM MISC 1 each by Does not apply route as directed.  . lidocaine (XYLOCAINE) 5 % ointment Apply 1 application topically at bedtime.   . nitroGLYCERIN (NITROSTAT) 0.4 MG SL tablet Place 1 tablet (0.4 mg total) under the tongue every 5 (five) minutes x 3 doses as needed. For chest pains  . ondansetron (ZOFRAN) 4 MG tablet Take 4 mg by mouth every 6 (six) hours as needed for nausea or vomiting.  Marland Kitchen oxymorphone  (OPANA ER) 30 MG 12 hr tablet Take 1 tablet (30 mg total) by mouth every 12 (twelve) hours.  . pantoprazole (PROTONIX) 40 MG tablet  TAKE 1 TABLET BY MOUTH TWICE DAILY BEFORE A MEAL.  Marland Kitchen potassium chloride (K-DUR,KLOR-CON) 10 MEQ tablet TAKE 2 TABLETS BY MOUTH 2 TIMES DAILY.  Marland Kitchen psyllium (METAMUCIL SMOOTH TEXTURE) 28 % packet Take 1 packet by mouth at bedtime.  . ranitidine (ZANTAC) 300 MG tablet Take 300 mg by mouth 2 (two) times daily.  . TOPROL XL 50 MG 24 hr tablet TAKE 1 TABLET BY MOUTH DAILY WITH OR IMMEDIATELY FOLLOWING A MEAL.  Marland Kitchen traMADol (ULTRAM) 50 MG tablet Take 1 tablet (50 mg total) by mouth 2 (two) times daily.  . traZODone (DESYREL) 25 mg TABS tablet Take 0.5 tablets (25 mg total) by mouth at bedtime. (Patient taking differently: Take 50 mg by mouth at bedtime. )  . VOLTAREN 1 % GEL APPLY 2 GRAMS ON BOTH KNEES THREE TIMES DAILY   No facility-administered encounter medications on file as of 06/18/2015.  :  Review of Systems:  Out of a complete 14 point review of systems, all are reviewed and negative with the exception of these symptoms as listed below:   Review of Systems  Constitutional: Positive for fatigue.  HENT: Positive for trouble swallowing.   Respiratory: Positive for shortness of breath and wheezing.   Cardiovascular: Positive for chest pain and leg swelling.  Neurological: Positive for weakness, numbness and headaches.       Patient last had sleep study about 2 years ago. Patient has used CPAP many years. About a year ago her neurologist in [place] told her to stop using CPAP due to losing weight.  Nocturia, snoring, witnessed apnea, wakes up feeling tires, daytime tiredness, morning headaches, takes naps, restless legs.    Epworth Sleepiness Scale 0= would never doze 1= slight chance of dozing 2= moderate chance of dozing 3= high chance of dozing  Sitting and reading:3 Watching TV:3 Sitting inactive in a public place (ex. Theater or meeting):3 As a passenger in a  car for an hour without a break:3 Lying down to rest in the afternoon:3 Sitting and talking to someone:2 Sitting quietly after lunch (no alcohol):3 In a car, while stopped in traffic:2 Total:22  Objective:  Neurologic Exam  Physical Exam Physical Examination:   Filed Vitals:   06/18/15 1543  BP: 138/72  Pulse: 78  Resp: 18    General Examination: The patient is a very pleasant 65 y.o. female in no acute distress. She appears well-developed and well-nourished and well groomed. She is morbidly obese.  HEENT: Normocephalic, atraumatic, pupils are equal, round and reactive to light and accommodation. Funduscopic exam is normal with sharp disc margins noted. Extraocular tracking is good without limitation to gaze excursion or nystagmus noted. Normal smooth pursuit is noted. Hearing is grossly intact. Tympanic membranes are clear bilaterally. Face is symmetric with normal facial animation and normal facial sensation. Speech is clear with no dysarthria noted. There is no hypophonia. There is no lip, neck/head, jaw or voice tremor. Neck is supple with full range of passive and active motion. There are no carotid bruits on auscultation. Oropharynx exam reveals: mild mouth dryness, adequate dental hygiene , but several missing teeth, moderate airway crowding secondary to redundant soft palate and smaller airway entry, larger tongue, tonsils are small. Neck circumference is 17-3/8 inches. Mallampati is class II. Tongue protrudes centrally and palate elevates symmetrically.   Chest: Clear to auscultation without wheezing, rhonchi or crackles noted.  Heart: S1+S2+0, regular and normal without murmurs, rubs or gallops noted.   Abdomen: Soft, non-tender and non-distended with normal bowel sounds  appreciated on auscultation.  Extremities: There is 1+ pitting edema in the distal lower extremities bilaterally. Pedal pulses are intact.  Skin: Warm and dry without trophic changes noted. There are no  varicose veins.  Musculoskeletal: exam reveals no obvious joint deformities, tenderness or joint swelling or erythema.   Neurologically:  Mental status: The patient is awake, alert and oriented in all 4 spheres. Her immediate and remote memory, attention, language skills and fund of knowledge are appropriate. There is no evidence of aphasia, agnosia, apraxia or anomia. Speech is clear with normal prosody and enunciation. Thought process is linear. Mood is normal and affect is normal.  Cranial nerves II - XII are as described above under HEENT exam. In addition: shoulder shrug is normal with equal shoulder height noted. Motor exam: Normal bulk, strength and tone is noted. There is no drift, tremor or rebound. Romberg is positive with prominent swaying. Reflexes are 1+ in the upper extremities and absent in both lower extremities. Babinski: Toes are flexor bilaterally. Fine motor skills and coordination: intact with normal finger taps, normal hand movements, normal rapid alternating patting, normal foot taps and normal foot agility.  Cerebellar testing: No dysmetria or intention tremor on finger to nose testing. Heel to shin is difficult bilaterally and limited because she reports pain in both hips and lower back area bilaterally.  Sensory exam: intact to light touch, pinprick, vibration, temperature sense in the upper extremities with decreased sensation to all modalities up to below knees bilaterally, symptoms of tingling and numbness reported higher than that as well.  Gait, station and balance: She stands with difficulty. No veering to one side is noted. No leaning to one side is noted. Posture is age-appropriate and stance is narrow based. Gait shows slow and cautious gait, slow turns, tandem walk is not possible.    Assessment and Plan:  In summary, [name] is a very pleasant 65 y.o.-year old female with an underlying complex medical history of coronary artery disease, status post stent  placement in 2007, diabetes, with history of painful diabetic neuropathy, lumbar spinal stenosis with chronic back pain, COPD, reflux disease, CHF, arthritis, hyperlipidemia, palpitations, anxiety, depression, and morbid obesity, who was previously diagnosed with obstructive sleep apnea and placed on CPAP therapy. Her history and physical exam are in keeping with obstructive sleep apnea (OSA). She has not used CPAP in over 1-1/2 years and has an outdated machine that is over 74 years old. She needs reevaluation. I had a long chat with the patient and Joe about my findings and the diagnosis of OSA, its prognosis and treatment options. We talked about medical treatments, surgical interventions and non-pharmacological approaches. I explained in particular the risks and ramifications of untreated moderate to severe OSA, especially with respect to developing cardiovascular disease down the Road, including congestive heart failure, difficult to treat hypertension, cardiac arrhythmias, or stroke. Even type 2 diabetes has, in part, been linked to untreated OSA. Symptoms of untreated OSA include daytime sleepiness, memory problems, mood irritability and mood disorder such as depression and anxiety, lack of energy, as well as recurrent headaches, especially morning headaches. We talked about trying to maintain a  healthy lifestyle in general, as well as the importance of weight control. I encouraged the patient to eat healthy, exercise daily and keep well hydrated, to keep a scheduled bedtime and wake time routine, to not skip any meals and eat healthy snacks in between meals. I advised the patient not to drive when feeling sleepy.I recommended the  following at this time: sleep study with potential positive airway pressure titration. (We will score hypopneas at 4% and split the sleep study into diagnostic and treatment portion, if the estimated. 2 hour AHI is >15/h).  She says she does not drive. Her boyfriend will likely  drop her off and pick her up the next day. I explained the sleep test procedure to the patient and also outlined possible surgical and non-surgical treatment options of OSA, including the use of a custom-made dental device (which would require a referral to a specialist dentist or oral surgeon), upper airway surgical options, such as pillar implants, radiofrequency surgery, tongue base surgery, and UPPP (which would involve a referral to an ENT surgeon). Rarely, jaw surgery such as mandibular advancement may be considered.  I also explained the CPAP treatment option to the patient, who indicated that she would be willing to try CPAP if the need arises. I explained the importance of being compliant with PAP treatment, not only for insurance purposes but primarily to improve Her symptoms, and for the patient's long term health benefit, including to reduce Her cardiovascular risks. I answered all her questions today and the patient and her BF were in agreement. I would like to see her back after the sleep study is completed and encouraged her to call with any interim questions, concerns, problems or updates.   Thank you very much for allowing me to participate in the care of this nice patient. If I can be of any further assistance to you please do not hesitate to call me at [phone].  Sincerely,   Star Age, MD, PhD

## 2015-06-20 ENCOUNTER — Encounter: Payer: Self-pay | Admitting: *Deleted

## 2015-06-26 ENCOUNTER — Ambulatory Visit (INDEPENDENT_AMBULATORY_CARE_PROVIDER_SITE_OTHER): Payer: Medicaid Other | Admitting: Neurology

## 2015-06-26 DIAGNOSIS — G4734 Idiopathic sleep related nonobstructive alveolar hypoventilation: Secondary | ICD-10-CM

## 2015-06-26 DIAGNOSIS — G4730 Sleep apnea, unspecified: Secondary | ICD-10-CM

## 2015-06-26 DIAGNOSIS — G479 Sleep disorder, unspecified: Secondary | ICD-10-CM

## 2015-06-26 DIAGNOSIS — G4733 Obstructive sleep apnea (adult) (pediatric): Secondary | ICD-10-CM | POA: Diagnosis not present

## 2015-06-26 DIAGNOSIS — G4720 Circadian rhythm sleep disorder, unspecified type: Secondary | ICD-10-CM

## 2015-06-26 DIAGNOSIS — G4710 Hypersomnia, unspecified: Secondary | ICD-10-CM

## 2015-06-27 NOTE — Sleep Study (Signed)
Please see the scanned sleep study interpretation located in the Procedure tab within the Chart Review section. 

## 2015-07-05 ENCOUNTER — Other Ambulatory Visit: Payer: Self-pay | Admitting: "Endocrinology

## 2015-07-05 ENCOUNTER — Telehealth: Payer: Self-pay | Admitting: Neurology

## 2015-07-05 DIAGNOSIS — G4734 Idiopathic sleep related nonobstructive alveolar hypoventilation: Secondary | ICD-10-CM

## 2015-07-05 DIAGNOSIS — G4733 Obstructive sleep apnea (adult) (pediatric): Secondary | ICD-10-CM

## 2015-07-05 LAB — HEMOGLOBIN A1C
Hgb A1c MFr Bld: 7.1 % — ABNORMAL HIGH (ref <5.7)
Mean Plasma Glucose: 157 mg/dL

## 2015-07-05 LAB — LIPID PANEL
Cholesterol: 156 mg/dL (ref 125–200)
HDL: 58 mg/dL (ref >=46)
LDL Cholesterol: 45 mg/dL (ref <130)
Total CHOL/HDL Ratio: 2.7 Ratio (ref <=5.0)
Triglycerides: 267 mg/dL — ABNORMAL HIGH (ref <150)
VLDL: 53 mg/dL — ABNORMAL HIGH (ref <30)

## 2015-07-05 LAB — BASIC METABOLIC PANEL
BUN: 13 mg/dL (ref 7–25)
CO2: 24 mmol/L (ref 20–31)
Calcium: 8.5 mg/dL — ABNORMAL LOW (ref 8.6–10.4)
Chloride: 107 mmol/L (ref 98–110)
Creat: 0.76 mg/dL (ref 0.50–0.99)
Glucose, Bld: 161 mg/dL — ABNORMAL HIGH (ref 65–99)
Potassium: 4.1 mmol/L (ref 3.5–5.3)
Sodium: 142 mmol/L (ref 135–146)

## 2015-07-05 LAB — T4, FREE: Free T4: 1.2 ng/dL (ref 0.8–1.8)

## 2015-07-05 LAB — TSH: TSH: 3.28 mIU/L

## 2015-07-05 NOTE — Telephone Encounter (Signed)
Patient referred by Dr. Letta Pate, seen by me on 06/18/15, diagnostic PSG on 06/26/15, ins: MCD.   Please call and notify the patient that the recent sleep study did confirm the diagnosis of obstructive sleep apnea and that I recommend treatment for this in the form of CPAP as she did not sleep well and had lower O2 values. This will require a repeat sleep study for proper titration and mask fitting. Please explain to patient and arrange for a CPAP titration study. I have placed an order in the chart. Thanks, and please route to [facility] for scheduling next sleep study.  Star Age, MD, PhD [facility])

## 2015-07-09 NOTE — Telephone Encounter (Signed)
I spoke to patient and she is aware of results and recommendations. She is willing to proceed with titration study and is already scheduled for it.  Dawn: patient states that she has not received her ppw from you for 2nd sleep study. I advised her that our mail usually takes a couple days more than regular mail but stated that I will check with you just to make sure.

## 2015-07-11 ENCOUNTER — Encounter: Payer: Self-pay | Admitting: Registered Nurse

## 2015-07-11 ENCOUNTER — Encounter: Payer: Medicaid Other | Attending: Physical Medicine & Rehabilitation | Admitting: Registered Nurse

## 2015-07-11 VITALS — BP 130/54 | HR 80 | Resp 14

## 2015-07-11 DIAGNOSIS — E1140 Type 2 diabetes mellitus with diabetic neuropathy, unspecified: Secondary | ICD-10-CM

## 2015-07-11 DIAGNOSIS — R51 Headache: Secondary | ICD-10-CM | POA: Insufficient documentation

## 2015-07-11 DIAGNOSIS — F418 Other specified anxiety disorders: Secondary | ICD-10-CM | POA: Diagnosis not present

## 2015-07-11 DIAGNOSIS — K219 Gastro-esophageal reflux disease without esophagitis: Secondary | ICD-10-CM | POA: Diagnosis not present

## 2015-07-11 DIAGNOSIS — Z794 Long term (current) use of insulin: Secondary | ICD-10-CM | POA: Insufficient documentation

## 2015-07-11 DIAGNOSIS — G894 Chronic pain syndrome: Secondary | ICD-10-CM | POA: Diagnosis not present

## 2015-07-11 DIAGNOSIS — E1142 Type 2 diabetes mellitus with diabetic polyneuropathy: Secondary | ICD-10-CM | POA: Insufficient documentation

## 2015-07-11 DIAGNOSIS — I252 Old myocardial infarction: Secondary | ICD-10-CM | POA: Insufficient documentation

## 2015-07-11 DIAGNOSIS — J449 Chronic obstructive pulmonary disease, unspecified: Secondary | ICD-10-CM | POA: Insufficient documentation

## 2015-07-11 DIAGNOSIS — G8929 Other chronic pain: Secondary | ICD-10-CM | POA: Diagnosis not present

## 2015-07-11 DIAGNOSIS — M47816 Spondylosis without myelopathy or radiculopathy, lumbar region: Secondary | ICD-10-CM

## 2015-07-11 DIAGNOSIS — E785 Hyperlipidemia, unspecified: Secondary | ICD-10-CM | POA: Insufficient documentation

## 2015-07-11 DIAGNOSIS — M79604 Pain in right leg: Secondary | ICD-10-CM | POA: Insufficient documentation

## 2015-07-11 DIAGNOSIS — R002 Palpitations: Secondary | ICD-10-CM | POA: Insufficient documentation

## 2015-07-11 DIAGNOSIS — R079 Chest pain, unspecified: Secondary | ICD-10-CM | POA: Diagnosis not present

## 2015-07-11 DIAGNOSIS — M25562 Pain in left knee: Secondary | ICD-10-CM

## 2015-07-11 DIAGNOSIS — R000 Tachycardia, unspecified: Secondary | ICD-10-CM | POA: Insufficient documentation

## 2015-07-11 DIAGNOSIS — Z760 Encounter for issue of repeat prescription: Secondary | ICD-10-CM | POA: Insufficient documentation

## 2015-07-11 DIAGNOSIS — G4733 Obstructive sleep apnea (adult) (pediatric): Secondary | ICD-10-CM | POA: Insufficient documentation

## 2015-07-11 DIAGNOSIS — I2510 Atherosclerotic heart disease of native coronary artery without angina pectoris: Secondary | ICD-10-CM | POA: Diagnosis not present

## 2015-07-11 DIAGNOSIS — Z79899 Other long term (current) drug therapy: Secondary | ICD-10-CM

## 2015-07-11 DIAGNOSIS — M5489 Other dorsalgia: Secondary | ICD-10-CM | POA: Insufficient documentation

## 2015-07-11 DIAGNOSIS — Z5181 Encounter for therapeutic drug level monitoring: Secondary | ICD-10-CM

## 2015-07-11 DIAGNOSIS — M47896 Other spondylosis, lumbar region: Secondary | ICD-10-CM | POA: Diagnosis not present

## 2015-07-11 DIAGNOSIS — M1990 Unspecified osteoarthritis, unspecified site: Secondary | ICD-10-CM | POA: Insufficient documentation

## 2015-07-11 DIAGNOSIS — Z1401 Asymptomatic hemophilia A carrier: Secondary | ICD-10-CM | POA: Diagnosis not present

## 2015-07-11 DIAGNOSIS — G4700 Insomnia, unspecified: Secondary | ICD-10-CM | POA: Diagnosis not present

## 2015-07-11 DIAGNOSIS — I509 Heart failure, unspecified: Secondary | ICD-10-CM | POA: Insufficient documentation

## 2015-07-11 DIAGNOSIS — M79605 Pain in left leg: Secondary | ICD-10-CM | POA: Diagnosis not present

## 2015-07-11 MED ORDER — TRAMADOL HCL 50 MG PO TABS: 50.0000 mg | ORAL_TABLET | Freq: Two times a day (BID) | ORAL | Status: DC

## 2015-07-11 MED ORDER — OXYMORPHONE HCL ER 30 MG PO TB12: 30.0000 mg | ORAL_TABLET | Freq: Two times a day (BID) | ORAL | Status: DC

## 2015-07-11 NOTE — Patient Instructions (Signed)
Increase Gabapentin to 4 times a day  Call office Next week Wednesday to evaluate  [phone]

## 2015-07-11 NOTE — Progress Notes (Signed)
Subjective:    Patient ID: [name], female    DOB: Nov 14, 1950, 65 y.o.   MRN: [id number]  HPI: [name] is a 65 year old female who returns for follow up for chronic pain and medication refill. She states her pain is located in her bilateral shoulder's,mid- lower back,  bilateral lower extremities, left knee and bilateral feet. Also states she's having generalized pain all over. She rates her pain 8. Her current exercise regime is chair exercises and walking short distances.   Pain Inventory Average Pain 9 Pain Right Now 8 My pain is constant, sharp, burning, dull, stabbing, tingling and aching  In the last 24 hours, has pain interfered with the following? General activity 9 Relation with others 9 Enjoyment of life 9 What TIME of day is your pain at its worst? morning, night  Sleep (in general) Poor  Pain is worse with: walking, bending, sitting and standing Pain improves with: heat/ice and medication Relief from Meds: 3  Mobility walk without assistance walk with assistance use a cane use a walker how many minutes can you walk? 10 ability to climb steps?  no do you drive?  no needs help with transfers Do you have any goals in this area?  yes  Function disabled: date disabled . I need assistance with the following:  dressing, bathing, household duties and shopping Do you have any goals in this area?  no  Neuro/Psych bladder control problems bowel control problems weakness numbness tingling spasms dizziness depression anxiety  Prior Studies Any changes since last visit?  no  Physicians involved in your care Any changes since last visit?  no   Family History  Problem Relation Age of Onset  . Cancer    . Coronary artery disease    . Heart attack Mother   . Colon cancer Mother   . Heart attack Father   . Stroke Sister   . Hemophilia Child   . Cancer Brother   . Cancer Maternal Aunt   . Cancer Maternal Uncle   . Cancer Paternal Aunt    . Cancer Paternal Uncle   . Cancer Brother    Social History   Social History  . Marital Status: Legally Separated    Spouse Name: N/A  . Number of Children: 4  . Years of Education: N/A   Occupational History  . Disabled    Social History Main Topics  . Smoking status: Never Smoker   . Smokeless tobacco: Never Used  . Alcohol Use: No  . Drug Use: No  . Sexual Activity: Not Asked   Other Topics Concern  . None   Social History Narrative   Patient does not get regular exercise   Denies caffeine use    Past Surgical History  Procedure Laterality Date  . Coronary angioplasty with stent placement    . Colonoscopy  08/06/2011    Procedure: COLONOSCOPY;  Surgeon: Emily Houston, Hopkins;  Location: [facility];  Service: Endoscopy;  Laterality: N/A;  215  . Arm surgery Bilateral     due to fall-pt broke both forearms  . Colonoscopy with propofol N/A 12/31/2012    Procedure: COLONOSCOPY WITH PROPOFOL;  Surgeon: Emily Houston, Hopkins;  Location: [facility];  Service: Endoscopy;  Laterality: N/A;  in cecum at 0814, total withdrawal time 7mn  . Esophagogastroduodenoscopy (egd) with propofol N/A 12/31/2012    Procedure: ESOPHAGOGASTRODUODENOSCOPY (EGD) WITH PROPOFOL;  Surgeon: [name];  Location: [facility];  Service: Endoscopy;  Laterality: N/A;  GE junction 37,  . Maloney dilation N/A 12/31/2012    Procedure: [name] DILATION;  Surgeon: Emily Houston, Hopkins;  Location: [facility];  Service: Endoscopy;  Laterality: N/A;  used a # 54,#56  . Cholecystectomy    . Abdominal hysterectomy    . Esophagogastroduodenoscopy (egd) with propofol N/A 07/01/2013    Procedure: ESOPHAGOGASTRODUODENOSCOPY (EGD) WITH PROPOFOL;  Surgeon: Emily Houston, Hopkins;  Location: [facility];  Service: Endoscopy;  Laterality: N/A;  950  . Maloney dilation N/A 07/01/2013    Procedure: [name] DILATION;  Surgeon: Emily Houston, Hopkins;  Location: [facility];  Service: Endoscopy;  Laterality: N/A;  54/58; no heme present  .  Biopsy N/A 07/01/2013    Procedure: BIOPSY;  Surgeon: Emily Houston, Hopkins;  Location: [facility];  Service: Endoscopy;  Laterality: N/A;  . Esophagogastroduodenoscopy N/A 06/07/2015    Procedure: ESOPHAGOGASTRODUODENOSCOPY (EGD);  Surgeon: Emily Houston, Hopkins;  [facility]: [facility];  Service: Endoscopy;  Laterality: N/A;  2:45 - moved to 1:00 - Ann to notify pt  . Esophageal dilation N/A 06/07/2015    Procedure: ESOPHAGEAL DILATION;  Surgeon: Emily Houston, Hopkins;  [facility]: [facility];  Service: Endoscopy;  Laterality: N/A;   Past Medical History  Diagnosis Date  . Palpitations   . Tachycardia   . CAD (coronary artery disease)     Stent placement circumflex coronary 2007, catheterization 2008 patent stents. Normal LV function  . Chest pain   . Diabetes mellitus     Insulin dependent  . Dyslipidemia   . Anxiety and depression   . Diabetic polyneuropathy (HCC)      severe on multiple medications  . 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 MI (myocardial infarction) ([place])     2007  . Anxiety   . Depression   . Obstructive sleep apnea     CPAP machine at night  . COPD (chronic obstructive pulmonary disease) ([place])   . GERD (gastroesophageal reflux disease)   . CHF (congestive heart failure) ([place])   . Headache(784.0)   . Arthritis    BP 130/54 mmHg  Pulse 80  Resp 14  SpO2 94%  Opioid Risk Score:   Fall Risk Score:  `1  Depression screen PHQ 2/9  Depression screen [facility] 2/9 05/14/2015 05/14/2015 05/02/2015 12/11/2014 11/13/2014 10/12/2014  Decreased Interest 0 0 0 3 3 3   Down, Depressed, Hopeless 0 0 0 3 3 3   PHQ - 2 Score 0 0 0 6 6 6   Altered sleeping - - - - - 2  Tired, decreased energy - - - - - 3  Change in appetite - - - - - 3  Feeling bad or failure about yourself  - - - - - 0  Trouble concentrating - - - - - 0  Moving slowly or fidgety/restless - - - - - 0  Suicidal thoughts - - - - - 0  PHQ-9 Score - - - - - 14     Review of Systems  Constitutional: Positive for diaphoresis.    Respiratory: Positive for apnea and shortness of breath.   Gastrointestinal: Positive for nausea, abdominal pain, diarrhea and constipation.  All other systems reviewed and are negative.      Objective:   Physical Exam  Constitutional: She is oriented to person, place, and time. She appears well-developed and well-nourished.  HENT:  Head: Normocephalic and atraumatic.  Neck: Normal range of motion. Neck supple.  Cardiovascular: Normal rate  and regular rhythm.   Pulmonary/Chest: Effort normal and breath sounds normal.  Musculoskeletal:  Normal Muscle Bulk and Muscle Testing Reveals: Upper Extremities: Full ROM and Muscle Strength 5/5 Thoracic and Lumbar Hypersensitivity Lower Extremities: Full ROM and Muscle Strength 5/5 Arises from chair slowly Narrow Based gait  Neurological: She is alert and oriented to person, place, and time.  Skin: Skin is warm and dry.  Psychiatric: She has a normal mood and affect.  Nursing note and vitals reviewed.         Assessment & Plan:  1.Lumbar pain lumbar spondylosis: Encouraged to increase Activity as tolerated and continue HEP. Refilled: Opana 30 mg one tablet every 12 hours as needed #60 and Continue Tramadol 50 mg one tablet twice a day #60. We will continue the opioid monitoring program, this consists of regular clinic visits, examinations, urine drug screen, pill counts as well as use of [place] Controlled Substance Reporting System. 2.. Bilateral Knee Pain/ Degenerative: Continue Voltaren Gel. Continue to Monitor 3. Severe diabetic poly neuropathy: Increased Gabapentin.Continue to Monitor 4. Insomnia: On Trazodone PCP Following   20 minutes of face to face patient care time was spent during this visit. All questions were encouraged and answered.   F/U in 1 Month.

## 2015-07-12 ENCOUNTER — Ambulatory Visit (INDEPENDENT_AMBULATORY_CARE_PROVIDER_SITE_OTHER): Payer: Medicaid Other | Admitting: "Endocrinology

## 2015-07-12 ENCOUNTER — Encounter: Payer: Medicaid Other | Attending: "Endocrinology | Admitting: Nutrition

## 2015-07-12 ENCOUNTER — Encounter: Payer: Self-pay | Admitting: Nutrition

## 2015-07-12 ENCOUNTER — Encounter: Payer: Self-pay | Admitting: "Endocrinology

## 2015-07-12 VITALS — BP 138/76 | HR 100 | Ht 66.0 in | Wt 248.0 lb

## 2015-07-12 VITALS — Ht 66.0 in | Wt 248.0 lb

## 2015-07-12 DIAGNOSIS — E1159 Type 2 diabetes mellitus with other circulatory complications: Secondary | ICD-10-CM | POA: Insufficient documentation

## 2015-07-12 DIAGNOSIS — E1165 Type 2 diabetes mellitus with hyperglycemia: Secondary | ICD-10-CM

## 2015-07-12 DIAGNOSIS — E118 Type 2 diabetes mellitus with unspecified complications: Secondary | ICD-10-CM

## 2015-07-12 DIAGNOSIS — E785 Hyperlipidemia, unspecified: Secondary | ICD-10-CM | POA: Diagnosis not present

## 2015-07-12 DIAGNOSIS — I10 Essential (primary) hypertension: Secondary | ICD-10-CM | POA: Diagnosis not present

## 2015-07-12 MED ORDER — METFORMIN HCL 500 MG PO TABS: 500.0000 mg | ORAL_TABLET | Freq: Two times a day (BID) | ORAL | Status: DC

## 2015-07-12 MED ORDER — FLUCONAZOLE 150 MG PO TABS: 150.0000 mg | ORAL_TABLET | Freq: Once | ORAL | Status: DC

## 2015-07-12 NOTE — Progress Notes (Signed)
  Medical Nutrition Therapy:  Appt start time: 1430 end time:  1500.  Assessment:  Primary concerns today: Diabetes. Type 2. Here with her CNA.  A1C 7.1 down from 8.4%. Has been having multiple low blood sugars. Stop Humalog with meals and  Reduce  Lantus from 30 units of Lantus to only 10 units. and started Metformin 500 mg BID today and will stay on Invokana per Dr. Dorris Fetch today .Changes in diet:  Cut out snacks and junk  Food. Drinking more water.  Feels better.   Diet is much better balanced and more consistent with carbs and and more lower carb vegetables. Making excellent progress.  Lab Results  Component Value Date   HGBA1C 7.1* 07/05/2015   Preferred Learning Style:   No preference indicated   Learning Readiness:   Ready  Change in progress   MEDICATIONS: See list   DIETARY INTAKE:     24-hr recall:  Breakast: Egg and 1 slice toast and water, fruit Lunch: Healthy Choice TV Dinners, water, fruit Dinner: Chicken ranch wrap, water Snack  Drinks: water and  Usual physical activity: ADL;   Estimated energy needs: 1200 calories 135 g carbohydrates 90 g protein 33 g fat  Progress Towards Goal(s):  In progress.   Nutritional Diagnosis:  NB-1.1 Food and nutrition-related knowledge deficit As related to diabetes  As evidenced by A1C 8.6%. .    Intervention:  .Nutrition and Diabetes education provided on My Plate, CHO counting, meal planning, portion sizes, timing of meals, avoiding snacks between meals unless having a low blood sugar, target ranges for A1C and blood sugars, signs/symptoms and treatment of hyper/hypoglycemia, monitoring blood sugars, taking medications as prescribed, benefits of exercising 30 minutes per day and prevention of complications of DM.  Goals Keep up the good job! 1. Follow My Plate Method 2. Eat 2-3 carb choices per meal. 3. Increase fresh fruits and vegetables. 4. Eat vegetables or protein for a snack if need. . 5. Walk 15 minutes  daily. 6. Stop your humalog insulin with meals per Dr. Dorris Fetch 7. Reduce Latnus 30 units daily  Per Dr. Dorris Fetch 8. Get A1C down to 7.% 9. Lose 1 lb per week.  Teaching Method Utilized:  Visual Auditory Hands on  Handouts given during visit include:  The Plate Method   Meal Plan Card  Diabetes Instructions.   Barriers to learning/adherence to lifestyle change: None  Demonstrated degree of understanding via:  Teach Back   Monitoring/Evaluation:  Dietary intake, exercise, meal planning SBG, and body weight in 3 month(s).

## 2015-07-12 NOTE — Progress Notes (Signed)
Subjective:    Patient ID: [name], female    DOB: [birth year]. Patient is being seen in f/u for management of diabetes.  Past Medical History  Diagnosis Date  . Palpitations   . Tachycardia   . CAD (coronary artery disease)     Stent placement circumflex coronary 2007, catheterization 2008 patent stents. Normal LV function  . Chest pain   . Diabetes mellitus     Insulin dependent  . Dyslipidemia   . Anxiety and depression   . Diabetic polyneuropathy (HCC)      severe on multiple medications  . 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 MI (myocardial infarction) ([place])     2007  . Anxiety   . Depression   . Obstructive sleep apnea     CPAP machine at night  . COPD (chronic obstructive pulmonary disease) ([place])   . GERD (gastroesophageal reflux disease)   . CHF (congestive heart failure) ([place])   . Headache(784.0)   . Arthritis    Past Surgical History  Procedure Laterality Date  . Coronary angioplasty with stent placement    . Colonoscopy  08/06/2011    Procedure: COLONOSCOPY;  Surgeon: Rogene Houston, MD;  [facility];  Service: Endoscopy;  Laterality: N/A;  215  . Arm surgery Bilateral     due to fall-pt broke both forearms  . Colonoscopy with propofol N/A 12/31/2012    Procedure: COLONOSCOPY WITH PROPOFOL;  Surgeon: Rogene Houston, MD;  Location: [facility];  Service: Endoscopy;  Laterality: N/A;  in cecum at 0814, total withdrawal time 47min  . Esophagogastroduodenoscopy (egd) with propofol N/A 12/31/2012    Procedure: ESOPHAGOGASTRODUODENOSCOPY (EGD) WITH PROPOFOL;  Surgeon: Rogene Houston, MD;  Location: [facility];  Service: Endoscopy;  Laterality: N/A;  GE junction 37,  . Maloney dilation N/A 12/31/2012    Procedure: Venia Minks DILATION;  Surgeon: Rogene Houston, MD;  Location: [facility];  Service: Endoscopy;  Laterality: N/A;  used a # 54,#56  . Cholecystectomy    . Abdominal hysterectomy    . Esophagogastroduodenoscopy (egd) with propofol N/A 07/01/2013    Procedure:  ESOPHAGOGASTRODUODENOSCOPY (EGD) WITH PROPOFOL;  Surgeon: Rogene Houston, MD;  Location: [facility];  Service: Endoscopy;  Laterality: N/A;  950  . Maloney dilation N/A 07/01/2013    Procedure: Venia Minks DILATION;  Surgeon: Rogene Houston, MD;  Location: [facility];  Service: Endoscopy;  Laterality: N/A;  54/58; no heme present  . Biopsy N/A 07/01/2013    Procedure: BIOPSY;  Surgeon: Rogene Houston, MD;  Location: [facility];  Service: Endoscopy;  Laterality: N/A;  . Esophagogastroduodenoscopy N/A 06/07/2015    Procedure: ESOPHAGOGASTRODUODENOSCOPY (EGD);  Surgeon: Rogene Houston, MD;  [facility];  Service: Endoscopy;  Laterality: N/A;  2:45 - moved to 1:00 - Ann to notify pt  . Esophageal dilation N/A 06/07/2015    Procedure: ESOPHAGEAL DILATION;  Surgeon: Rogene Houston, MD;  [facility];  Service: Endoscopy;  Laterality: N/A;   Social History   Social History  . Marital Status: Legally Separated    Spouse Name: N/A  . Number of Children: 4  . Years of Education: N/A   Occupational History  . Disabled    Social History Main Topics  . Smoking status: Never Smoker   . Smokeless tobacco: Never Used  . Alcohol Use: No  . Drug Use: No  . Sexual Activity: Not Asked   Other Topics Concern  . None  Social History Narrative   Patient does not get regular exercise   Denies caffeine use    Outpatient Encounter Prescriptions as of 07/12/2015  Medication Sig  . Insulin Glargine (LANTUS SOLOSTAR [place]) Inject 30 Units into the skin at bedtime.  . ALPRAZolam (XANAX) 1 MG tablet Take 1 tablet (1 mg total) by mouth 4 (four) times daily.  Marland Kitchen aspirin EC 81 MG tablet Take 81 mg by mouth daily.  Marland Kitchen atorvastatin (LIPITOR) 40 MG tablet Take 40 mg by mouth daily.  . beclomethasone (QVAR) 40 MCG/ACT inhaler Inhale 1 puff into the lungs 2 (two) times daily.  . canagliflozin (INVOKANA) 100 MG TABS tablet Take 1 tablet (100 mg total) by mouth daily before breakfast.  . dicyclomine (BENTYL) 10  MG capsule TAKE 1 CAPSULE BY MOUTH TWICE DAILY AS NEEDED FOR SPASMS.  Marland Kitchen estrogens, conjugated, (PREMARIN) 1.25 MG tablet Take 1.25 mg by mouth daily.   . fluconazole (DIFLUCAN) 150 MG tablet Take 1 tablet (150 mg total) by mouth once.  . fluticasone-salmeterol (ADVAIR HFA) 115-21 MCG/ACT inhaler Inhale 2 puffs into the lungs 2 (two) times daily.  Marland Kitchen gabapentin (NEURONTIN) 400 MG capsule Take 400 mg by mouth 3 (three) times daily.  Marland Kitchen glucose blood (ACCU-CHEK AVIVA) test strip Use to test 4 times a day  . ibuprofen (ADVIL,MOTRIN) 200 MG tablet Take 200 mg by mouth daily as needed for headache.  . Insulin Pen Needle (B-D ULTRAFINE III SHORT PEN) 31G X 8 MM MISC 1 each by Does not apply route as directed.  . lidocaine (XYLOCAINE) 5 % ointment Apply 1 application topically at bedtime.   . metFORMIN (GLUCOPHAGE) 500 MG tablet Take 1 tablet (500 mg total) by mouth 2 (two) times daily with a meal.  . nitroGLYCERIN (NITROSTAT) 0.4 MG SL tablet Place 1 tablet (0.4 mg total) under the tongue every 5 (five) minutes x 3 doses as needed. For chest pains  . ondansetron (ZOFRAN) 4 MG tablet Take 4 mg by mouth every 6 (six) hours as needed for nausea or vomiting.  Marland Kitchen oxymorphone (OPANA ER) 30 MG 12 hr tablet Take 1 tablet (30 mg total) by mouth every 12 (twelve) hours.  . pantoprazole (PROTONIX) 40 MG tablet TAKE 1 TABLET BY MOUTH TWICE DAILY BEFORE A MEAL.  Marland Kitchen potassium chloride (K-DUR,KLOR-CON) 10 MEQ tablet TAKE 2 TABLETS BY MOUTH 2 TIMES DAILY.  Marland Kitchen psyllium (METAMUCIL SMOOTH TEXTURE) 28 % packet Take 1 packet by mouth at bedtime.  . ranitidine (ZANTAC) 300 MG tablet Take 300 mg by mouth 2 (two) times daily.  . TOPROL XL 50 MG 24 hr tablet TAKE 1 TABLET BY MOUTH DAILY WITH OR IMMEDIATELY FOLLOWING A MEAL.  Marland Kitchen traMADol (ULTRAM) 50 MG tablet Take 1 tablet (50 mg total) by mouth 2 (two) times daily.  . traZODone (DESYREL) 25 mg TABS tablet Take 0.5 tablets (25 mg total) by mouth at bedtime. (Patient taking differently:  Take 50 mg by mouth at bedtime. )  . VOLTAREN 1 % GEL APPLY 2 GRAMS ON BOTH KNEES THREE TIMES DAILY  . [DISCONTINUED] Insulin Glargine (LANTUS SOLOSTAR) 100 UNIT/ML Solostar Pen Inject 50 Units into the skin daily with breakfast. (Patient taking differently: Inject 30 Units into the skin daily with breakfast. )  . [DISCONTINUED] insulin lispro (HUMALOG KWIKPEN) 100 UNIT/ML KiwkPen Inject 0.15-0.21 mLs (15-21 Units total) into the skin 3 (three) times daily with meals. (Patient taking differently: Inject 5 Units into the skin 3 (three) times daily with meals. )   No  facility-administered encounter medications on file as of 07/12/2015.   ALLERGIES: Allergies  Allergen Reactions  . Nitrofuran Derivatives Itching and Swelling  . Amphetamine-Dextroamphet Er Swelling  . Pregabalin Swelling  . Topiramate Other (See Comments)    Tongue tingle  . Verelan [Verapamil] Rash   VACCINATION STATUS:  There is no immunization history on file for this patient.  Diabetes She presents for her follow-up diabetic visit. She has type 2 diabetes mellitus. Onset time: She was diagnosed at approximate age of 82 years. Her disease course has been improving. There are no hypoglycemic associated symptoms. Pertinent negatives for hypoglycemia include no confusion, headaches, pallor or seizures. Associated symptoms include fatigue. Pertinent negatives for diabetes include no chest pain, no polydipsia, no polyphagia and no polyuria. There are no hypoglycemic complications. Symptoms are improving. Diabetic complications include autonomic neuropathy, heart disease, peripheral neuropathy and retinopathy. Risk factors for coronary artery disease include dyslipidemia, diabetes mellitus, obesity, hypertension and sedentary lifestyle. Current diabetic treatments: Lantus 65 units daily at bedtime, Humalog 15 units 3 times a day before meals. She mentions allergy to Victoza, Byetta, and metformin. Her weight is decreasing steadily. She  is following a generally unhealthy diet. When asked about meal planning, she reported none. She has not had a previous visit with a dietitian. She never participates in exercise. Home blood sugar record trend: She did not bring her logs however her meter shows average of 1 50 g/dL for the last 7 days. Her overall blood glucose range is 140-180 mg/dl. An ACE inhibitor/angiotensin II receptor blocker is not being taken. Eye exam is current.  Hyperlipidemia This is a chronic problem. The current episode started more than 1 year ago. The problem is uncontrolled. Recent lipid tests were reviewed and are high. Exacerbating diseases include diabetes and obesity. Pertinent negatives include no chest pain, myalgias or shortness of breath. Current antihyperlipidemic treatment includes statins. Risk factors for coronary artery disease include diabetes mellitus, dyslipidemia, hypertension, obesity and a sedentary lifestyle.  Hypertension This is a chronic problem. The current episode started more than 1 year ago. Pertinent negatives include no chest pain, headaches, palpitations or shortness of breath. Risk factors for coronary artery disease include diabetes mellitus, dyslipidemia, obesity and sedentary lifestyle. Hypertensive end-organ damage includes CAD/MI and retinopathy.    Review of Systems  Constitutional: Positive for fatigue. Negative for fever, chills and unexpected weight change.  HENT: Negative for trouble swallowing and voice change.   Eyes: Negative for visual disturbance.  Respiratory: Negative for cough, shortness of breath and wheezing.   Cardiovascular: Negative for chest pain, palpitations and leg swelling.  Gastrointestinal: Negative for nausea, vomiting and diarrhea.  Endocrine: Negative for cold intolerance, heat intolerance, polydipsia, polyphagia and polyuria.  Musculoskeletal: Negative for myalgias and arthralgias.  Skin: Negative for color change, pallor, rash and wound.   Neurological: Negative for seizures and headaches.  Psychiatric/Behavioral: Negative for suicidal ideas and confusion.    Objective:    BP 138/76 mmHg  Pulse 100  Ht 5\' 6"  (1.676 m)  Wt 248 lb (112.492 kg)  BMI 40.05 kg/m2  SpO2 98%  Wt Readings from Last 3 Encounters:  07/12/15 248 lb (112.492 kg)  06/18/15 248 lb (112.492 kg)  06/07/15 259 lb (117.482 kg)    Physical Exam  Constitutional: She is oriented to person, place, and time. She appears well-developed.  HENT:  Head: Normocephalic and atraumatic.  She has very poor dentition.  Eyes: EOM are normal.  Neck: Normal range of motion. Neck supple. No tracheal  deviation present. No thyromegaly present.  Cardiovascular: Normal rate and regular rhythm.   Pulmonary/Chest: Effort normal and breath sounds normal.  Abdominal: Soft. Bowel sounds are normal. There is no tenderness. There is no guarding.  Musculoskeletal: Normal range of motion. She exhibits no edema.  Neurological: She is alert and oriented to person, place, and time. She has normal reflexes. No cranial nerve deficit. Coordination normal.  Skin: Skin is warm and dry. No rash noted. No erythema. No pallor.  Psychiatric: She has a normal mood and affect. Judgment normal.    CMP     Component Value Date/Time   NA 142 07/05/2015 0937   K 4.1 07/05/2015 0937   CL 107 07/05/2015 0937   CO2 24 07/05/2015 0937   GLUCOSE 161* 07/05/2015 0937   BUN 13 07/05/2015 0937   CREATININE 0.76 07/05/2015 0937   CREATININE 0.66 06/30/2013 1540   CALCIUM 8.5* 07/05/2015 0937   PROT 7.9 12/02/2012 1547   ALBUMIN 3.4* 12/02/2012 1547   AST 12 12/02/2012 1547   ALT 9 12/02/2012 1547   ALKPHOS 117 12/02/2012 1547   BILITOT 0.4 12/02/2012 1547   GFRNONAA >90 06/30/2013 1540   GFRAA >90 06/30/2013 1540   Diabetic Labs (most recent): Lab Results  Component Value Date   HGBA1C 7.1* 07/05/2015   HGBA1C 8.6 03/22/2015   HGBA1C * 04/30/2007    8.1 (NOTE)   The ADA recommends  the following therapeutic goals for glycemic   control related to Hgb A1C measurement:   Goal of Therapy:   < 7.0% Hgb A1C   Action Suggested:  > 8.0% Hgb A1C   Ref:  Diabetes Care, 22, Suppl. 1, 1999    Lipid Panel     Component Value Date/Time   CHOL 156 07/05/2015 0937   TRIG 267* 07/05/2015 0937   HDL 58 07/05/2015 0937   CHOLHDL 2.7 07/05/2015 0937   VLDL 53* 07/05/2015 0937   LDLCALC 45 07/05/2015 0937     Assessment & Plan:   1. Type 2 diabetes mellitus with vascular disease ([place])  - Patient has currently uncontrolled symptomatic type 2 DM since  65 years of age,  with most recent A1c Improved to 7.1% from 8.6 %. Recent labs reviewed. -She came with tightly controlled blood glucose profile mainly because she did use Lantus 30 units 3 times a day.    Her diabetes is complicated by coronary artery disease status post stent placement and patient remains at a high risk for more acute and chronic complications of diabetes which include CAD, CVA, CKD, retinopathy, and neuropathy. These are all discussed in detail with the patient.  - I have counseled the patient on diet management and weight loss, by adopting a carbohydrate restricted/protein rich diet. - She came with overall weight loss of 11 pounds since February 2017. - Suggestion is made for patient to avoid simple carbohydrates   from their diet including Cakes , Desserts, Ice Cream,  Soda (  diet and regular) , Sweet Tea , Candies,  Chips, Cookies, Artificial Sweeteners,   and "Sugar-free" Products . This will help patient to have stable blood glucose profile and potentially avoid unintended weight gain.  - I encouraged the patient to switch to  unprocessed or minimally processed complex starch and increased protein intake (animal or plant source), fruits, and vegetables.  - Patient is advised to stick to a routine mealtimes to eat 3 meals  a day and avoid unnecessary snacks ( to snack only to correct hypoglycemia).  -  The  patient will be scheduled with Jearld Fenton, RDN, CDE for individualized DM education.  - I have approached patient with the following individualized plan to manage diabetes and patient agrees:   - She has normal renal function. -I will proceed with a lower dose of Lantus 30 units QHS, and   hold off prandial insulin Humalog for now.   - I advised her to continue monitoring of blood glucose before meals breakfast and at bedtime.  - Patient is warned not to take insulin without proper monitoring per orders. -Adjustment parameters are given for hypo and hyperglycemia in writing. -Patient is encouraged to call clinic for blood glucose levels less than 70 or above 300 mg /dl. -She mentions allergy to a number of use for medications unfortunately, Victoza, Byetta. - She is willing to try metformin since she does not remember any particular adverse reaction from metformin. - I will start metformin 500 mg by mouth twice a day. -She will continue Invokana 100 mg by mouth every morning. -Side effects and precautions discussed with her. -I will prescribe one-time dose of Diflucan 150 mg by mouth once to treat yeast infection. - Patient specific target  A1c;  LDL, HDL, Triglycerides, and  Waist Circumference were discussed in detail.  2) BP/HTN: Controlled. Continue current medications . 3) Lipids/HPL:  Uncontrolled with triglycerides of 902, continue statins. Better control of diabetes will help for triglycerides. 4)  Weight/Diet: CDE Consult will be initiated , exercise, and detailed carbohydrates information provided.  5) Chronic Care/Health Maintenance:  -Patient is on Statin medications and encouraged to continue to follow up with Ophthalmology, Podiatrist at least yearly or according to recommendations, and advised to   stay away from smoking. I have recommended yearly flu vaccine and pneumonia vaccination at least every 5 years; moderate intensity exercise for up to 150 minutes weekly; and   sleep for at least 7 hours a day.  - 30 minutes of time was spent on the care of this patient , 50% of which was applied for counseling on diabetes complications and their preventions.  - Patient to bring meter and  blood glucose logs during their next visit.   - I advised patient to maintain close follow up with Glenda Chroman, MD for primary care needs.  Follow up plan: - Return in about 3 months (around 10/11/2015) for diabetes, high blood pressure, high cholesterol, follow up with pre-visit labs, meter, and logs.  Glade Lloyd, MD Phone: [phone]  Fax: [phone]   07/12/2015, 10:32 AM

## 2015-07-12 NOTE — Patient Instructions (Signed)
Goals Keep up the good job! 1. Follow My Plate Method 2. Eat 2-3 carb choices per meal. 3. Increase fresh fruits and vegetables. 4. Eat vegetables or protein for a snack if need. . 5. Walk 15 minutes daily. 6. Stop your humalog insulin with meals 7. Reduce Latnus 30 units daily . 8. Get A1C down to 7.% 9. Lose 1 lb per week.

## 2015-07-12 NOTE — Patient Instructions (Signed)

## 2015-07-13 ENCOUNTER — Encounter: Payer: Self-pay | Admitting: Nutrition

## 2015-07-24 ENCOUNTER — Ambulatory Visit (INDEPENDENT_AMBULATORY_CARE_PROVIDER_SITE_OTHER): Payer: Medicaid Other | Admitting: Neurology

## 2015-07-24 DIAGNOSIS — G4734 Idiopathic sleep related nonobstructive alveolar hypoventilation: Secondary | ICD-10-CM | POA: Diagnosis not present

## 2015-07-24 DIAGNOSIS — G4733 Obstructive sleep apnea (adult) (pediatric): Secondary | ICD-10-CM | POA: Diagnosis not present

## 2015-07-24 DIAGNOSIS — G4720 Circadian rhythm sleep disorder, unspecified type: Secondary | ICD-10-CM

## 2015-07-24 DIAGNOSIS — G479 Sleep disorder, unspecified: Secondary | ICD-10-CM

## 2015-07-26 ENCOUNTER — Telehealth: Payer: Self-pay | Admitting: Neurology

## 2015-07-26 DIAGNOSIS — G4734 Idiopathic sleep related nonobstructive alveolar hypoventilation: Secondary | ICD-10-CM

## 2015-07-26 DIAGNOSIS — F1190 Opioid use, unspecified, uncomplicated: Secondary | ICD-10-CM

## 2015-07-26 DIAGNOSIS — G4733 Obstructive sleep apnea (adult) (pediatric): Secondary | ICD-10-CM

## 2015-07-26 NOTE — Telephone Encounter (Signed)
I spoke to patient and she is aware of results and recommendations. She is willing to proceed with treatment. I will send order to [facility]. She is willing to see Pulmonologist. I will send referral.

## 2015-07-26 NOTE — Telephone Encounter (Signed)
Patient referred by Dr. Letta Pate, seen by me on 06/18/15, diagnostic PSG on 06/26/15, cpap study on 07/24/15, ins: MCD.  Please call and inform patient that I have entered an order for treatment with positive airway pressure (PAP) treatment of obstructive sleep apnea (OSA). She did fairly well during the latest sleep study with CPAP. We will, therefore, arrange for a machine for home use through a DME (durable medical equipment) company of Her choice; and I will see the patient back in follow-up in about 8-10 weeks. Please also explain to the patient that I will be looking out for compliance data, which can be downloaded from the machine (stored on an SD card, that is inserted in the machine) or via remote access through a modem, that is built into the machine. At the time of the followup appointment we will discuss sleep study results and how it is going with PAP treatment at home. Please advise patient to bring Her machine at the time of the first FU visit, even though this is cumbersome. Bringing the machine for every visit after that will likely not be needed, but often helps for the first visit to troubleshoot if needed. Please re-enforce the importance of compliance with treatment and the need for [place] to monitor compliance data - often an insurance requirement and actually good feedback for the patient as far as how they are doing.   However, she had persistent lower oxygen saturations in sleep, which may be due to more than one issue: being on narcotic pain medication, obesity and underlying lung d/s. I would recommend - unless she already does - that she see a lung specialist. If okay, please process referral   Also remind patient, that any interim PAP machine or mask issues should be first addressed with the DME company, as they can often help better with technical and mask fit issues. Please ask if patient has a preference regarding DME company.  Please also make sure, the patient has a follow-up  appointment with me in about 8-10 weeks from the setup date, thanks.  Once you have spoken to the patient - and faxed/routed report to PCP and referring MD (if other than PCP), you can close this encounter, thanks,   Star Age, MD, PhD [facility] ([place])

## 2015-07-27 ENCOUNTER — Telehealth: Payer: Self-pay | Admitting: Neurology

## 2015-07-27 DIAGNOSIS — E662 Morbid (severe) obesity with alveolar hypoventilation: Secondary | ICD-10-CM

## 2015-07-27 DIAGNOSIS — J449 Chronic obstructive pulmonary disease, unspecified: Secondary | ICD-10-CM

## 2015-07-27 NOTE — Telephone Encounter (Signed)
[facility] [phone] called referral that was put in the work que dx codes are for sleep and not pulmonary. The dept needs to go to [facility]-pulmonary care   Dept specialty Pulmonology   Provider specialty is pulmonary disease. Please check referral..thanks

## 2015-07-30 ENCOUNTER — Other Ambulatory Visit: Payer: Self-pay | Admitting: "Endocrinology

## 2015-07-30 NOTE — Telephone Encounter (Signed)
Do I need to put in a new referral [place] [facility]?

## 2015-08-01 NOTE — Telephone Encounter (Signed)
Got it.

## 2015-08-01 NOTE — Telephone Encounter (Signed)
What diagnosis codes do you want to use?

## 2015-08-01 NOTE — Telephone Encounter (Signed)
Spoke to [employer] and they relayed that all DX codes were for sleep and not for Pulmonary they need a new referral placed and DX for Pulmonary.

## 2015-08-01 NOTE — Telephone Encounter (Signed)
Placed referral to pulm for lung d/s not sleep

## 2015-08-09 ENCOUNTER — Encounter: Payer: Medicaid Other | Attending: Physical Medicine & Rehabilitation | Admitting: Registered Nurse

## 2015-08-09 ENCOUNTER — Encounter: Payer: Self-pay | Admitting: Registered Nurse

## 2015-08-09 VITALS — BP 141/78 | HR 86 | Resp 15

## 2015-08-09 DIAGNOSIS — M1990 Unspecified osteoarthritis, unspecified site: Secondary | ICD-10-CM | POA: Insufficient documentation

## 2015-08-09 DIAGNOSIS — Z5181 Encounter for therapeutic drug level monitoring: Secondary | ICD-10-CM | POA: Diagnosis not present

## 2015-08-09 DIAGNOSIS — I2510 Atherosclerotic heart disease of native coronary artery without angina pectoris: Secondary | ICD-10-CM | POA: Insufficient documentation

## 2015-08-09 DIAGNOSIS — Z1401 Asymptomatic hemophilia A carrier: Secondary | ICD-10-CM | POA: Insufficient documentation

## 2015-08-09 DIAGNOSIS — G4733 Obstructive sleep apnea (adult) (pediatric): Secondary | ICD-10-CM | POA: Diagnosis not present

## 2015-08-09 DIAGNOSIS — E1140 Type 2 diabetes mellitus with diabetic neuropathy, unspecified: Secondary | ICD-10-CM | POA: Diagnosis not present

## 2015-08-09 DIAGNOSIS — M25561 Pain in right knee: Secondary | ICD-10-CM

## 2015-08-09 DIAGNOSIS — G8929 Other chronic pain: Secondary | ICD-10-CM | POA: Diagnosis not present

## 2015-08-09 DIAGNOSIS — Z760 Encounter for issue of repeat prescription: Secondary | ICD-10-CM | POA: Diagnosis not present

## 2015-08-09 DIAGNOSIS — I252 Old myocardial infarction: Secondary | ICD-10-CM | POA: Insufficient documentation

## 2015-08-09 DIAGNOSIS — E1142 Type 2 diabetes mellitus with diabetic polyneuropathy: Secondary | ICD-10-CM | POA: Diagnosis not present

## 2015-08-09 DIAGNOSIS — Z794 Long term (current) use of insulin: Secondary | ICD-10-CM | POA: Diagnosis not present

## 2015-08-09 DIAGNOSIS — J449 Chronic obstructive pulmonary disease, unspecified: Secondary | ICD-10-CM | POA: Insufficient documentation

## 2015-08-09 DIAGNOSIS — Z79899 Other long term (current) drug therapy: Secondary | ICD-10-CM

## 2015-08-09 DIAGNOSIS — G894 Chronic pain syndrome: Secondary | ICD-10-CM | POA: Diagnosis not present

## 2015-08-09 DIAGNOSIS — G4700 Insomnia, unspecified: Secondary | ICD-10-CM | POA: Diagnosis not present

## 2015-08-09 DIAGNOSIS — M79604 Pain in right leg: Secondary | ICD-10-CM | POA: Insufficient documentation

## 2015-08-09 DIAGNOSIS — R002 Palpitations: Secondary | ICD-10-CM | POA: Insufficient documentation

## 2015-08-09 DIAGNOSIS — M47816 Spondylosis without myelopathy or radiculopathy, lumbar region: Secondary | ICD-10-CM

## 2015-08-09 DIAGNOSIS — E785 Hyperlipidemia, unspecified: Secondary | ICD-10-CM | POA: Diagnosis not present

## 2015-08-09 DIAGNOSIS — R079 Chest pain, unspecified: Secondary | ICD-10-CM | POA: Insufficient documentation

## 2015-08-09 DIAGNOSIS — F418 Other specified anxiety disorders: Secondary | ICD-10-CM | POA: Insufficient documentation

## 2015-08-09 DIAGNOSIS — M5489 Other dorsalgia: Secondary | ICD-10-CM | POA: Diagnosis not present

## 2015-08-09 DIAGNOSIS — R51 Headache: Secondary | ICD-10-CM | POA: Diagnosis not present

## 2015-08-09 DIAGNOSIS — K219 Gastro-esophageal reflux disease without esophagitis: Secondary | ICD-10-CM | POA: Insufficient documentation

## 2015-08-09 DIAGNOSIS — I509 Heart failure, unspecified: Secondary | ICD-10-CM | POA: Diagnosis not present

## 2015-08-09 DIAGNOSIS — M47896 Other spondylosis, lumbar region: Secondary | ICD-10-CM | POA: Insufficient documentation

## 2015-08-09 DIAGNOSIS — R000 Tachycardia, unspecified: Secondary | ICD-10-CM | POA: Insufficient documentation

## 2015-08-09 DIAGNOSIS — M79605 Pain in left leg: Secondary | ICD-10-CM | POA: Insufficient documentation

## 2015-08-09 DIAGNOSIS — M25562 Pain in left knee: Secondary | ICD-10-CM

## 2015-08-09 MED ORDER — OXYMORPHONE HCL ER 30 MG PO TB12: 30.0000 mg | ORAL_TABLET | Freq: Two times a day (BID) | ORAL | Status: DC

## 2015-08-09 NOTE — Progress Notes (Signed)
Subjective:    Patient ID: [name], female    DOB: 25-Nov-1950, 65 y.o.   MRN: [id number]  HPI:[name] is a 65 year old female who returns for follow up for chronic pain and medication refill. She states her pain is located in her bilateral shoulder's,mid- lower back, bilateral lower extremities, bilateral knee's and bilateral feet. Also states she's having generalized pain all over. She did not rate her pain today. Her current exercise regime is chair exercises and walking short distances.  Pain Inventory Average Pain 9 Pain Right Now NA My pain is constant, sharp, burning, dull, stabbing, tingling and aching  In the last 24 hours, has pain interfered with the following? General activity 9 Relation with others 8 Enjoyment of life 9 What TIME of day is your pain at its worst? morning, night Sleep (in general) Poor  Pain is worse with: walking, bending, sitting, inactivity and standing Pain improves with: medication Relief from Meds: a little  Mobility walk with assistance use a walker how many minutes can you walk? 5 minutes ability to climb steps?  no do you drive?  no  Function I need assistance with the following:  dressing, bathing, meal prep, household duties and shopping  Neuro/Psych bladder control problems weakness numbness tingling spasms dizziness depression anxiety loss of taste or smell  Prior Studies Any changes since last visit?  no  Physicians involved in your care Any changes since last visit?  no   Family History  Problem Relation Age of Onset  . Cancer    . Coronary artery disease    . Heart attack Mother   . Colon cancer Mother   . Heart attack Father   . Stroke Sister   . Hemophilia Child   . Cancer Brother   . Cancer Maternal Aunt   . Cancer Maternal Uncle   . Cancer Paternal Aunt   . Cancer Paternal Uncle   . Cancer Brother    Social History   Social History  . Marital Status: Legally Separated    Spouse  Name: N/A  . Number of Children: 4  . Years of Education: N/A   Occupational History  . Disabled    Social History Main Topics  . Smoking status: Never Smoker   . Smokeless tobacco: Never Used  . Alcohol Use: No  . Drug Use: No  . Sexual Activity: Not Asked   Other Topics Concern  . None   Social History Narrative   Patient does not get regular exercise   Denies caffeine use    Past Surgical History  Procedure Laterality Date  . Coronary angioplasty with stent placement    . Colonoscopy  08/06/2011    Procedure: COLONOSCOPY;  Surgeon: Rogene Houston, MD;  [facility];  Service: Endoscopy;  Laterality: N/A;  215  . Arm surgery Bilateral     due to fall-pt broke both forearms  . Colonoscopy with propofol N/A 12/31/2012    Procedure: COLONOSCOPY WITH PROPOFOL;  Surgeon: Rogene Houston, MD;  Location: [facility];  Service: Endoscopy;  Laterality: N/A;  in cecum at 0814, total withdrawal time 33min  . Esophagogastroduodenoscopy (egd) with propofol N/A 12/31/2012    Procedure: ESOPHAGOGASTRODUODENOSCOPY (EGD) WITH PROPOFOL;  Surgeon: Rogene Houston, MD;  [facility];  Service: Endoscopy;  Laterality: N/A;  GE junction 37,  . Maloney dilation N/A 12/31/2012    Procedure: Venia Minks DILATION;  Surgeon: Rogene Houston, MD;  [facility]: [facility];  Service: Endoscopy;  Laterality: N/A;  used a # 54,#56  . Cholecystectomy    . Abdominal hysterectomy    . Esophagogastroduodenoscopy (egd) with propofol N/A 07/01/2013    Procedure: ESOPHAGOGASTRODUODENOSCOPY (EGD) WITH PROPOFOL;  Surgeon: Rogene Houston, MD;  [facility];  Service: Endoscopy;  Laterality: N/A;  950  . Maloney dilation N/A 07/01/2013    Procedure: Venia Minks DILATION;  Surgeon: Rogene Houston, MD;  [facility];  Service: Endoscopy;  Laterality: N/A;  54/58; no heme present  . Biopsy N/A 07/01/2013    Procedure: BIOPSY;  Surgeon: Rogene Houston, MD;  [facility];  Service: Endoscopy;  Laterality: N/A;  .  Esophagogastroduodenoscopy N/A 06/07/2015    Procedure: ESOPHAGOGASTRODUODENOSCOPY (EGD);  Surgeon: Rogene Houston, MD;  [facility];  Service: Endoscopy;  Laterality: N/A;  2:45 - moved to 1:00 - Ann to notify pt  . Esophageal dilation N/A 06/07/2015    Procedure: ESOPHAGEAL DILATION;  Surgeon: Rogene Houston, MD;  [facility];  Service: Endoscopy;  Laterality: N/A;   Past Medical History  Diagnosis Date  . Palpitations   . Tachycardia   . CAD (coronary artery disease)     Stent placement circumflex coronary 2007, catheterization 2008 patent stents. Normal LV function  . Chest pain   . Diabetes mellitus     Insulin dependent  . Dyslipidemia   . Anxiety and depression   . Diabetic polyneuropathy (HCC)      severe on multiple medications  . 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 MI (myocardial infarction) ([place])     2007  . Anxiety   . Depression   . Obstructive sleep apnea     CPAP machine at night  . COPD (chronic obstructive pulmonary disease) ([place])   . GERD (gastroesophageal reflux disease)   . CHF (congestive heart failure) ([place])   . Headache(784.0)   . Arthritis    BP 141/78 mmHg  Pulse 86  Resp 15  SpO2 95%  Opioid Risk Score:   Fall Risk Score:  `1  Depression screen PHQ 2/9  Depression screen [facility] 2/9 07/12/2015 07/12/2015 05/14/2015 05/14/2015 05/02/2015 12/11/2014 11/13/2014  Decreased Interest 0 0 0 0 0 3 3  Down, Depressed, Hopeless 0 0 0 0 0 3 3  PHQ - 2 Score 0 0 0 0 0 6 6  Altered sleeping - - - - - - -  Tired, decreased energy - - - - - - -  Change in appetite - - - - - - -  Feeling bad or failure about yourself  - - - - - - -  Trouble concentrating - - - - - - -  Moving slowly or fidgety/restless - - - - - - -  Suicidal thoughts - - - - - - -  PHQ-9 Score - - - - - - -     Review of Systems  Constitutional: Positive for appetite change.       Bladder control problems  Loss of taste or smell  Respiratory: Positive for apnea and shortness of  breath.   Gastrointestinal: Positive for nausea, abdominal pain, diarrhea and constipation.  Genitourinary: Positive for dysuria.  Neurological: Positive for dizziness, weakness and numbness.       Tingling Spasms   Psychiatric/Behavioral: Positive for dysphoric mood. The patient is nervous/anxious.   All other systems reviewed and are negative.      Objective:   Physical Exam  Constitutional: She is oriented to  person, place, and time. She appears well-developed and well-nourished.  HENT:  Head: Normocephalic and atraumatic.  Neck: Normal range of motion. Neck supple.  Cardiovascular: Normal rate and regular rhythm.   Pulmonary/Chest: Effort normal and breath sounds normal.  Musculoskeletal:  Normal Muscle Bulk and Muscle testing Reveals: Upper Extremities: Full ROM and Muscle Strength 5/5 Thoracic and Lumbar Hypersensitivity Lower Extremities: Decreased ROM and Muscle Strength 4/5 Bilateral Lower Extremities Flexion Produces Pain into Lumbar Arises from chair slowly using walker for support Antalgic gait  Neurological: She is alert and oriented to person, place, and time.  Skin: Skin is warm and dry.  Psychiatric: She has a normal mood and affect.  Nursing note and vitals reviewed.         Assessment & Plan:  1.Lumbar pain lumbar spondylosis: Encouraged to increase Activity as tolerated and continue HEP. Refilled: Opana 30 mg one tablet every 12 hours as needed #60 and Continue Tramadol 50 mg one tablet twice a day #60. We will continue the opioid monitoring program, this consists of regular clinic visits, examinations, urine drug screen, pill counts as well as use of [place] Controlled Substance Reporting System. 2.. Bilateral Knee Pain/ Degenerative: Continue Voltaren Gel. Continue to Monitor 3. Severe diabetic poly neuropathy: Continue Gabapentin. 4. Insomnia: On Trazodone PCP Following   20 minutes of face to face patient care time was spent during this  visit. All questions were encouraged and answered.   F/U in 1 Month.

## 2015-08-23 ENCOUNTER — Other Ambulatory Visit: Payer: Self-pay | Admitting: "Endocrinology

## 2015-08-30 ENCOUNTER — Other Ambulatory Visit: Payer: Self-pay | Admitting: Physical Medicine & Rehabilitation

## 2015-09-10 ENCOUNTER — Encounter: Payer: Self-pay | Admitting: Registered Nurse

## 2015-09-10 ENCOUNTER — Encounter: Payer: Medicaid Other | Attending: Physical Medicine & Rehabilitation | Admitting: Registered Nurse

## 2015-09-10 VITALS — BP 128/64 | HR 77

## 2015-09-10 DIAGNOSIS — G894 Chronic pain syndrome: Secondary | ICD-10-CM

## 2015-09-10 DIAGNOSIS — Z760 Encounter for issue of repeat prescription: Secondary | ICD-10-CM | POA: Insufficient documentation

## 2015-09-10 DIAGNOSIS — M47896 Other spondylosis, lumbar region: Secondary | ICD-10-CM | POA: Diagnosis not present

## 2015-09-10 DIAGNOSIS — R002 Palpitations: Secondary | ICD-10-CM | POA: Insufficient documentation

## 2015-09-10 DIAGNOSIS — E785 Hyperlipidemia, unspecified: Secondary | ICD-10-CM | POA: Diagnosis not present

## 2015-09-10 DIAGNOSIS — M546 Pain in thoracic spine: Secondary | ICD-10-CM

## 2015-09-10 DIAGNOSIS — J449 Chronic obstructive pulmonary disease, unspecified: Secondary | ICD-10-CM | POA: Diagnosis not present

## 2015-09-10 DIAGNOSIS — R079 Chest pain, unspecified: Secondary | ICD-10-CM | POA: Insufficient documentation

## 2015-09-10 DIAGNOSIS — F418 Other specified anxiety disorders: Secondary | ICD-10-CM | POA: Insufficient documentation

## 2015-09-10 DIAGNOSIS — E1140 Type 2 diabetes mellitus with diabetic neuropathy, unspecified: Secondary | ICD-10-CM

## 2015-09-10 DIAGNOSIS — M79605 Pain in left leg: Secondary | ICD-10-CM | POA: Insufficient documentation

## 2015-09-10 DIAGNOSIS — M47816 Spondylosis without myelopathy or radiculopathy, lumbar region: Secondary | ICD-10-CM

## 2015-09-10 DIAGNOSIS — K219 Gastro-esophageal reflux disease without esophagitis: Secondary | ICD-10-CM | POA: Diagnosis not present

## 2015-09-10 DIAGNOSIS — G4733 Obstructive sleep apnea (adult) (pediatric): Secondary | ICD-10-CM | POA: Insufficient documentation

## 2015-09-10 DIAGNOSIS — Z5181 Encounter for therapeutic drug level monitoring: Secondary | ICD-10-CM

## 2015-09-10 DIAGNOSIS — Z79899 Other long term (current) drug therapy: Secondary | ICD-10-CM

## 2015-09-10 DIAGNOSIS — Z794 Long term (current) use of insulin: Secondary | ICD-10-CM | POA: Diagnosis not present

## 2015-09-10 DIAGNOSIS — E1142 Type 2 diabetes mellitus with diabetic polyneuropathy: Secondary | ICD-10-CM | POA: Insufficient documentation

## 2015-09-10 DIAGNOSIS — M79604 Pain in right leg: Secondary | ICD-10-CM | POA: Diagnosis not present

## 2015-09-10 DIAGNOSIS — I2510 Atherosclerotic heart disease of native coronary artery without angina pectoris: Secondary | ICD-10-CM | POA: Insufficient documentation

## 2015-09-10 DIAGNOSIS — R000 Tachycardia, unspecified: Secondary | ICD-10-CM | POA: Diagnosis not present

## 2015-09-10 DIAGNOSIS — R51 Headache: Secondary | ICD-10-CM | POA: Diagnosis not present

## 2015-09-10 DIAGNOSIS — Z1401 Asymptomatic hemophilia A carrier: Secondary | ICD-10-CM | POA: Diagnosis not present

## 2015-09-10 DIAGNOSIS — I509 Heart failure, unspecified: Secondary | ICD-10-CM | POA: Insufficient documentation

## 2015-09-10 DIAGNOSIS — G8929 Other chronic pain: Secondary | ICD-10-CM | POA: Insufficient documentation

## 2015-09-10 DIAGNOSIS — I252 Old myocardial infarction: Secondary | ICD-10-CM | POA: Insufficient documentation

## 2015-09-10 DIAGNOSIS — M1990 Unspecified osteoarthritis, unspecified site: Secondary | ICD-10-CM | POA: Insufficient documentation

## 2015-09-10 DIAGNOSIS — M5489 Other dorsalgia: Secondary | ICD-10-CM | POA: Insufficient documentation

## 2015-09-10 DIAGNOSIS — G4700 Insomnia, unspecified: Secondary | ICD-10-CM | POA: Insufficient documentation

## 2015-09-10 MED ORDER — TRAMADOL HCL 50 MG PO TABS: 50.0000 mg | ORAL_TABLET | Freq: Two times a day (BID) | ORAL | Status: DC

## 2015-09-10 MED ORDER — OXYMORPHONE HCL ER 30 MG PO TB12: 30.0000 mg | ORAL_TABLET | Freq: Two times a day (BID) | ORAL | Status: DC

## 2015-09-11 ENCOUNTER — Encounter: Payer: Self-pay | Admitting: Registered Nurse

## 2015-09-11 NOTE — Progress Notes (Signed)
Subjective:    Patient ID: [name], female    DOB: 1950/06/14, 65 y.o.   MRN: [id number]  HPI: [name] is a 65 year old female who returns for follow up for chronic pain and medication refill. She states her pain is located in her bilateral shoulder's and mid- lower back, bilateral lower extremities, bilateral knee's and bilateral feet. She rates her pain 8. Her current exercise regime is chair exercises and walking short distances.  Pain Inventory Average Pain 8 Pain Right Now 8 My pain is constant, sharp, burning, dull, stabbing, tingling and aching  In the last 24 hours, has pain interfered with the following? General activity 9 Relation with others 8 Enjoyment of life 9 What TIME of day is your pain at its worst? morning, night Sleep (in general) Fair  Pain is worse with: walking, bending, sitting, inactivity, standing and some activites Pain improves with: rest and medication Relief from Meds: 3  Mobility walk with assistance use a walker ability to climb steps?  no do you drive?  no needs help with transfers  Function disabled: date disabled .  Neuro/Psych bladder control problems weakness numbness tingling trouble walking dizziness depression anxiety loss of taste or smell  Prior Studies Any changes since last visit?  no  Physicians involved in your care Any changes since last visit?  no   Family History  Problem Relation Age of Onset  . Cancer    . Coronary artery disease    . Heart attack Mother   . Colon cancer Mother   . Heart attack Father   . Stroke Sister   . Hemophilia Child   . Cancer Brother   . Cancer Maternal Aunt   . Cancer Maternal Uncle   . Cancer Paternal Aunt   . Cancer Paternal Uncle   . Cancer Brother    Social History   Social History  . Marital Status: Legally Separated    Spouse Name: N/A  . Number of Children: 4  . Years of Education: N/A   Occupational History  . Disabled    Social  History Main Topics  . Smoking status: Never Smoker   . Smokeless tobacco: Never Used  . Alcohol Use: No  . Drug Use: No  . Sexual Activity: Not Asked   Other Topics Concern  . None   Social History Narrative   Patient does not get regular exercise   Denies caffeine use    Past Surgical History  Procedure Laterality Date  . Coronary angioplasty with stent placement    . Colonoscopy  08/06/2011    Procedure: COLONOSCOPY;  Surgeon: Rogene Houston, MD;  [facility];  Service: Endoscopy;  Laterality: N/A;  215  . Arm surgery Bilateral     due to fall-pt broke both forearms  . Colonoscopy with propofol N/A 12/31/2012    Procedure: COLONOSCOPY WITH PROPOFOL;  Surgeon: Rogene Houston, MD;  [facility];  Service: Endoscopy;  Laterality: N/A;  in cecum at 0814, total withdrawal time 48mn  . Esophagogastroduodenoscopy (egd) with propofol N/A 12/31/2012    Procedure: ESOPHAGOGASTRODUODENOSCOPY (EGD) WITH PROPOFOL;  Surgeon: NRogene Houston MD;  [facility];  Service: Endoscopy;  Laterality: N/A;  GE junction 37,  . Maloney dilation N/A 12/31/2012    Procedure: MVenia MinksDILATION;  Surgeon: NRogene Houston MD;  [facility];  Service: Endoscopy;  Laterality: N/A;  used a # 54,#56  . Cholecystectomy    .  Abdominal hysterectomy    . Esophagogastroduodenoscopy (egd) with propofol N/A 07/01/2013    Procedure: ESOPHAGOGASTRODUODENOSCOPY (EGD) WITH PROPOFOL;  Surgeon: Rogene Houston, MD;  [facility];  Service: Endoscopy;  Laterality: N/A;  950  . Maloney dilation N/A 07/01/2013    Procedure: Venia Minks DILATION;  Surgeon: Rogene Houston, MD;  [facility];  Service: Endoscopy;  Laterality: N/A;  54/58; no heme present  . Biopsy N/A 07/01/2013    Procedure: BIOPSY;  Surgeon: Rogene Houston, MD;  [facility];  Service: Endoscopy;  Laterality: N/A;  . Esophagogastroduodenoscopy N/A 06/07/2015    Procedure: ESOPHAGOGASTRODUODENOSCOPY (EGD);  Surgeon: Rogene Houston, MD;  [facility];  Service: Endoscopy;  Laterality: N/A;  2:45 - moved to 1:00 - Ann to notify pt  . Esophageal dilation N/A 06/07/2015    Procedure: ESOPHAGEAL DILATION;  Surgeon: Rogene Houston, MD;  [facility];  Service: Endoscopy;  Laterality: N/A;   Past Medical History  Diagnosis Date  . Palpitations   . Tachycardia   . CAD (coronary artery disease)     Stent placement circumflex coronary 2007, catheterization 2008 patent stents. Normal LV function  . Chest pain   . Diabetes mellitus     Insulin dependent  . Dyslipidemia   . Anxiety and depression   . Diabetic polyneuropathy (HCC)      severe on multiple medications  . 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 MI (myocardial infarction) ([place])     2007  . Anxiety   . Depression   . Obstructive sleep apnea     CPAP machine at night  . COPD (chronic obstructive pulmonary disease) ([place])   . GERD (gastroesophageal reflux disease)   . CHF (congestive heart failure) ([place])   . Headache(784.0)   . Arthritis    BP 128/64 mmHg  Pulse 77  SpO2 95%  Opioid Risk Score:   Fall Risk Score:  `1  Depression screen PHQ 2/9  Depression screen [facility] 2/9 07/12/2015 07/12/2015 05/14/2015 05/14/2015 05/02/2015 12/11/2014 11/13/2014  Decreased Interest 0 0 0 0 0 3 3  Down, Depressed, Hopeless 0 0 0 0 0 3 3  PHQ - 2 Score 0 0 0 0 0 6 6  Altered sleeping - - - - - - -  Tired, decreased energy - - - - - - -  Change in appetite - - - - - - -  Feeling bad or failure about yourself  - - - - - - -  Trouble concentrating - - - - - - -  Moving slowly or fidgety/restless - - - - - - -  Suicidal thoughts - - - - - - -  PHQ-9 Score - - - - - - -     Review of Systems  HENT: Negative.   Eyes: Negative.   Respiratory: Positive for shortness of breath.   Cardiovascular: Negative.   Gastrointestinal: Positive for nausea, vomiting, abdominal pain, diarrhea and constipation.  Endocrine: Negative.   Genitourinary: Negative.     Musculoskeletal: Positive for myalgias, back pain, arthralgias, gait problem, neck pain and neck stiffness.  Allergic/Immunologic: Negative.   Neurological: Positive for dizziness, weakness and numbness.       Tingling   Hematological: Negative.   Psychiatric/Behavioral: Positive for dysphoric mood. The patient is nervous/anxious.   All other systems reviewed and are negative.      Objective:   Physical Exam  Constitutional: She is oriented to person, place, and time.  She appears well-developed and well-nourished.  HENT:  Head: Normocephalic and atraumatic.  Neck: Normal range of motion. Neck supple.  Cardiovascular: Normal rate and regular rhythm.   Pulmonary/Chest: Effort normal and breath sounds normal.  Musculoskeletal:  Normal Muscle Bulk and Muscle Testing Reveals: Upper Extremities: Full ROM and Muscle Strength 5/5 Thoracic Paraspinal Tenderness: T-5- T-7  T-7- T-9 Lumbar Paraspinal Tenderness: L-3- L-5 Lower Extremities: Full ROM and Muscle Strength 5/5 Arises from table slowly using walker for support Narrow Based Gait   Neurological: She is alert and oriented to person, place, and time.  Skin: Skin is warm and dry.  Psychiatric: She has a normal mood and affect.  Nursing note and vitals reviewed.         Assessment & Plan:  1.Lumbar pain lumbar spondylosis: Encouraged to increase Activity as tolerated and continue HEP. Refilled: Opana 30 mg one tablet every 12 hours as needed #60 and Tramadol 50 mg one tablet twice a day #60. We will continue the opioid monitoring program, this consists of regular clinic visits, examinations, urine drug screen, pill counts as well as use of [place] Controlled Substance Reporting System. 2.. Bilateral Knee Pain/ Degenerative: Continue Voltaren Gel. Continue to Monitor 3. Severe diabetic poly neuropathy: Continue Gabapentin. 4. Insomnia: On Trazodone PCP Following   20 minutes of face to face patient care time was  spent during this visit. All questions were encouraged and answered.

## 2015-09-18 LAB — TOXASSURE SELECT,+ANTIDEPR,UR: PDF: 0

## 2015-09-21 ENCOUNTER — Telehealth: Payer: Self-pay

## 2015-09-21 NOTE — Progress Notes (Signed)
Urine drug screen for this encounter is consistent for prescribed medications.   

## 2015-09-21 NOTE — Telephone Encounter (Signed)
Pt's UDS came back with positive levels of Bupropion and Citalopram, which is not on her med list. Spoke with pt, she states that her PCP fills them for her.

## 2015-09-26 ENCOUNTER — Other Ambulatory Visit: Payer: Self-pay | Admitting: "Endocrinology

## 2015-10-02 ENCOUNTER — Telehealth: Payer: Self-pay | Admitting: "Endocrinology

## 2015-10-02 NOTE — Telephone Encounter (Signed)
Do we prescribe Neurontin for this pt?

## 2015-10-02 NOTE — Telephone Encounter (Signed)
Patient states Dr.Nida changed her dosage on her Neurontin and she has run out since the pharmacy does not have an updated RX on file. Please send updated RX to [facility].  Patients phone number [phone]

## 2015-10-03 ENCOUNTER — Other Ambulatory Visit: Payer: Self-pay | Admitting: "Endocrinology

## 2015-10-03 MED ORDER — GABAPENTIN 100 MG PO CAPS: 100.0000 mg | ORAL_CAPSULE | Freq: Three times a day (TID) | ORAL | Status: DC

## 2015-10-03 NOTE — Telephone Encounter (Signed)
Done

## 2015-10-04 ENCOUNTER — Other Ambulatory Visit: Payer: Self-pay | Admitting: "Endocrinology

## 2015-10-05 LAB — COMPREHENSIVE METABOLIC PANEL
ALT: 7 IU/L (ref 0–32)
AST: 10 IU/L (ref 0–40)
Albumin/Globulin Ratio: 1.3 (ref 1.2–2.2)
Albumin: 3.5 g/dL — ABNORMAL LOW (ref 3.6–4.8)
Alkaline Phosphatase: 92 IU/L (ref 39–117)
BUN/Creatinine Ratio: 15 (ref 12–28)
BUN: 11 mg/dL (ref 8–27)
Bilirubin Total: 0.5 mg/dL (ref 0.0–1.2)
CO2: 26 mmol/L (ref 18–29)
Calcium: 9.4 mg/dL (ref 8.7–10.3)
Chloride: 101 mmol/L (ref 96–106)
Creatinine, Ser: 0.71 mg/dL (ref 0.57–1.00)
GFR calc Af Amer: 106 mL/min/{1.73_m2} (ref >59)
GFR calc non Af Amer: 92 mL/min/{1.73_m2} (ref >59)
Globulin, Total: 2.6 g/dL (ref 1.5–4.5)
Glucose: 156 mg/dL — ABNORMAL HIGH (ref 65–99)
Potassium: 4.5 mmol/L (ref 3.5–5.2)
Sodium: 142 mmol/L (ref 134–144)
Total Protein: 6.1 g/dL (ref 6.0–8.5)

## 2015-10-05 LAB — HGB A1C W/O EAG: Hgb A1c MFr Bld: 7.8 % — ABNORMAL HIGH (ref 4.8–5.6)

## 2015-10-08 ENCOUNTER — Encounter: Payer: Medicaid Other | Attending: Physical Medicine & Rehabilitation | Admitting: Registered Nurse

## 2015-10-08 ENCOUNTER — Encounter: Payer: Self-pay | Admitting: Registered Nurse

## 2015-10-08 VITALS — BP 133/80 | HR 89 | Temp 98.3°F

## 2015-10-08 DIAGNOSIS — I509 Heart failure, unspecified: Secondary | ICD-10-CM | POA: Insufficient documentation

## 2015-10-08 DIAGNOSIS — E785 Hyperlipidemia, unspecified: Secondary | ICD-10-CM | POA: Diagnosis not present

## 2015-10-08 DIAGNOSIS — G8929 Other chronic pain: Secondary | ICD-10-CM | POA: Insufficient documentation

## 2015-10-08 DIAGNOSIS — M47816 Spondylosis without myelopathy or radiculopathy, lumbar region: Secondary | ICD-10-CM

## 2015-10-08 DIAGNOSIS — Z1401 Asymptomatic hemophilia A carrier: Secondary | ICD-10-CM | POA: Insufficient documentation

## 2015-10-08 DIAGNOSIS — K219 Gastro-esophageal reflux disease without esophagitis: Secondary | ICD-10-CM | POA: Diagnosis not present

## 2015-10-08 DIAGNOSIS — M546 Pain in thoracic spine: Secondary | ICD-10-CM | POA: Diagnosis not present

## 2015-10-08 DIAGNOSIS — Z794 Long term (current) use of insulin: Secondary | ICD-10-CM | POA: Insufficient documentation

## 2015-10-08 DIAGNOSIS — M25562 Pain in left knee: Secondary | ICD-10-CM

## 2015-10-08 DIAGNOSIS — I252 Old myocardial infarction: Secondary | ICD-10-CM | POA: Diagnosis not present

## 2015-10-08 DIAGNOSIS — Z79899 Other long term (current) drug therapy: Secondary | ICD-10-CM

## 2015-10-08 DIAGNOSIS — M5489 Other dorsalgia: Secondary | ICD-10-CM | POA: Diagnosis not present

## 2015-10-08 DIAGNOSIS — M47896 Other spondylosis, lumbar region: Secondary | ICD-10-CM | POA: Diagnosis not present

## 2015-10-08 DIAGNOSIS — G4700 Insomnia, unspecified: Secondary | ICD-10-CM | POA: Insufficient documentation

## 2015-10-08 DIAGNOSIS — R51 Headache: Secondary | ICD-10-CM | POA: Insufficient documentation

## 2015-10-08 DIAGNOSIS — G894 Chronic pain syndrome: Secondary | ICD-10-CM | POA: Diagnosis not present

## 2015-10-08 DIAGNOSIS — I2510 Atherosclerotic heart disease of native coronary artery without angina pectoris: Secondary | ICD-10-CM | POA: Diagnosis not present

## 2015-10-08 DIAGNOSIS — R000 Tachycardia, unspecified: Secondary | ICD-10-CM | POA: Insufficient documentation

## 2015-10-08 DIAGNOSIS — E1140 Type 2 diabetes mellitus with diabetic neuropathy, unspecified: Secondary | ICD-10-CM

## 2015-10-08 DIAGNOSIS — R002 Palpitations: Secondary | ICD-10-CM | POA: Diagnosis not present

## 2015-10-08 DIAGNOSIS — M1990 Unspecified osteoarthritis, unspecified site: Secondary | ICD-10-CM | POA: Insufficient documentation

## 2015-10-08 DIAGNOSIS — J449 Chronic obstructive pulmonary disease, unspecified: Secondary | ICD-10-CM | POA: Insufficient documentation

## 2015-10-08 DIAGNOSIS — G4733 Obstructive sleep apnea (adult) (pediatric): Secondary | ICD-10-CM | POA: Insufficient documentation

## 2015-10-08 DIAGNOSIS — E1142 Type 2 diabetes mellitus with diabetic polyneuropathy: Secondary | ICD-10-CM | POA: Diagnosis not present

## 2015-10-08 DIAGNOSIS — M25561 Pain in right knee: Secondary | ICD-10-CM

## 2015-10-08 DIAGNOSIS — Z760 Encounter for issue of repeat prescription: Secondary | ICD-10-CM | POA: Diagnosis present

## 2015-10-08 DIAGNOSIS — M79605 Pain in left leg: Secondary | ICD-10-CM | POA: Insufficient documentation

## 2015-10-08 DIAGNOSIS — R079 Chest pain, unspecified: Secondary | ICD-10-CM | POA: Insufficient documentation

## 2015-10-08 DIAGNOSIS — F418 Other specified anxiety disorders: Secondary | ICD-10-CM | POA: Insufficient documentation

## 2015-10-08 DIAGNOSIS — M79604 Pain in right leg: Secondary | ICD-10-CM | POA: Diagnosis not present

## 2015-10-08 DIAGNOSIS — Z5181 Encounter for therapeutic drug level monitoring: Secondary | ICD-10-CM

## 2015-10-08 MED ORDER — OXYMORPHONE HCL ER 30 MG PO TB12: 30.0000 mg | ORAL_TABLET | Freq: Two times a day (BID) | ORAL | 0 refills | Status: DC

## 2015-10-08 NOTE — Progress Notes (Signed)
Subjective:    Patient ID: [name], female    DOB: 1950-11-01, 65 y.o.   MRN: [id number]  HPI: [name] is a 65 year old female who returns for follow up for chronic pain and medication refill. She states her pain is located in her neck, lower back, bilateral hips,lower extremities, bilateral knee's and bilateral feet. She rates her pain 8. Her current exercise regime is chair exercises and walking short distances.  Pain Inventory Average Pain 8 Pain Right Now 8 My pain is sharp, burning, stabbing, tingling and aching  In the last 24 hours, has pain interfered with the following? General activity 7 Relation with others 8 Enjoyment of life 8 What TIME of day is your pain at its worst? morning and night Sleep (in general) Fair  Pain is worse with: walking, bending, sitting, inactivity, standing and some activites Pain improves with: medication Relief from Meds: 7  Mobility use a walker how many minutes can you walk? 5 ability to climb steps?  no do you drive?  no  Function I need assistance with the following:  dressing, bathing, household duties and shopping  Neuro/Psych bladder control problems bowel control problems weakness numbness tremor tingling trouble walking spasms dizziness depression anxiety loss of taste or smell  Prior Studies Any changes since last visit?  no  Physicians involved in your care Any changes since last visit?  no   Family History  Problem Relation Age of Onset  . Cancer    . Coronary artery disease    . Heart attack Mother   . Colon cancer Mother   . Heart attack Father   . Stroke Sister   . Hemophilia Child   . Cancer Brother   . Cancer Maternal Aunt   . Cancer Maternal Uncle   . Cancer Paternal Aunt   . Cancer Paternal Uncle   . Cancer Brother    Social History   Social History  . Marital status: Legally Separated    Spouse name: N/A  . Number of children: 4  . Years of education: N/A    Occupational History  . Disabled    Social History Main Topics  . Smoking status: Never Smoker  . Smokeless tobacco: Never Used  . Alcohol use No  . Drug use: No  . Sexual activity: Not Asked   Other Topics Concern  . None   Social History Narrative   Patient does not get regular exercise   Denies caffeine use    Past Surgical History:  Procedure Laterality Date  . ABDOMINAL HYSTERECTOMY    . Arm Surgery Bilateral    due to fall-pt broke both forearms  . BIOPSY N/A 07/01/2013   Procedure: BIOPSY;  Surgeon: Rogene Houston, MD;  [facility];  Service: Endoscopy;  Laterality: N/A;  . CHOLECYSTECTOMY    . COLONOSCOPY  08/06/2011   Procedure: COLONOSCOPY;  Surgeon: Rogene Houston, MD;  [facility];  Service: Endoscopy;  Laterality: N/A;  215  . COLONOSCOPY WITH PROPOFOL N/A 12/31/2012   Procedure: COLONOSCOPY WITH PROPOFOL;  Surgeon: Rogene Houston, MD;  [facility]: [facility];  Service: Endoscopy;  Laterality: N/A;  in cecum at 0814, total withdrawal time 75min  . CORONARY ANGIOPLASTY WITH STENT PLACEMENT    . ESOPHAGEAL DILATION N/A 06/07/2015   Procedure: ESOPHAGEAL DILATION;  Surgeon: Rogene Houston, MD;  [facility];  Service: Endoscopy;  Laterality: N/A;  . ESOPHAGOGASTRODUODENOSCOPY N/A 06/07/2015   Procedure:  ESOPHAGOGASTRODUODENOSCOPY (EGD);  Surgeon: Rogene Houston, MD;  [facility];  Service: Endoscopy;  Laterality: N/A;  2:45 - moved to 1:00 - Ann to notify pt  . ESOPHAGOGASTRODUODENOSCOPY (EGD) WITH PROPOFOL N/A 12/31/2012   Procedure: ESOPHAGOGASTRODUODENOSCOPY (EGD) WITH PROPOFOL;  Surgeon: Rogene Houston, MD;  [facility];  Service: Endoscopy;  Laterality: N/A;  GE junction 37,  . ESOPHAGOGASTRODUODENOSCOPY (EGD) WITH PROPOFOL N/A 07/01/2013   Procedure: ESOPHAGOGASTRODUODENOSCOPY (EGD) WITH PROPOFOL;  Surgeon: Rogene Houston, MD;  [facility];  Service: Endoscopy;  Laterality: N/A;  950  . MALONEY DILATION N/A  12/31/2012   Procedure: MALONEY DILATION;  Surgeon: Rogene Houston, MD;  [facility];  Service: Endoscopy;  Laterality: N/A;  used a # 54,#56  . MALONEY DILATION N/A 07/01/2013   Procedure: Venia Minks DILATION;  Surgeon: Rogene Houston, MD;  [facility];  Service: Endoscopy;  Laterality: N/A;  54/58; no heme present   Past Medical History:  Diagnosis Date  . Anxiety   . Anxiety and depression   . Arthritis   . CAD (coronary artery disease)    Stent placement circumflex coronary 2007, catheterization 2008 patent stents. Normal LV function  . Chest pain   . CHF (congestive heart failure) ([place])   . COPD (chronic obstructive pulmonary disease) ([place])   . Depression   . Diabetes mellitus    Insulin dependent  . Diabetic polyneuropathy (HCC)     severe on multiple medications  . Dyslipidemia   . GERD (gastroesophageal reflux disease)   . Headache(784.0)   . 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 MI (myocardial infarction) ([place])    2007  . Obstructive sleep apnea    CPAP machine at night  . Palpitations   . Tachycardia    There were no vitals taken for this visit.  Opioid Risk Score:   Fall Risk Score:  `1  Depression screen PHQ 2/9  Depression screen [facility] 2/9 07/12/2015 07/12/2015 05/14/2015 05/14/2015 05/02/2015 12/11/2014 11/13/2014  Decreased Interest 0 0 0 0 0 3 3  Down, Depressed, Hopeless 0 0 0 0 0 3 3  PHQ - 2 Score 0 0 0 0 0 6 6  Altered sleeping - - - - - - -  Tired, decreased energy - - - - - - -  Change in appetite - - - - - - -  Feeling bad or failure about yourself  - - - - - - -  Trouble concentrating - - - - - - -  Moving slowly or fidgety/restless - - - - - - -  Suicidal thoughts - - - - - - -  PHQ-9 Score - - - - - - -    Review of Systems  Constitutional: Positive for unexpected weight change.  Respiratory: Positive for apnea and shortness of breath.   Gastrointestinal: Positive for abdominal pain, diarrhea, nausea and vomiting.  Endocrine:       Blood sugars  fluctuating  Genitourinary: Positive for dysuria.  All other systems reviewed and are negative.      Objective:   Physical Exam  Constitutional: She is oriented to person, place, and time. She appears well-developed and well-nourished.  HENT:  Head: Normocephalic and atraumatic.  Neck: Normal range of motion. Neck supple.  Cardiovascular: Normal rate and regular rhythm.   Pulmonary/Chest: Effort normal and breath sounds normal.  Musculoskeletal:  Normal Muscle Bulk and Muscle Testing Reveals: Upper Extremities: Full ROM and Muscle Strength 5/5 Thoracic and Lumbar  Hypersensitivity Bilateral Greater Trochanter Tenderness Lower Extremities: Full ROM and Muscle Strength 5/5 Left Lower Extremity Flexion Produces Pain into Lumbar and Left Hip Arises from Table slowly using walker for support Antalgic gait  Neurological: She is alert and oriented to person, place, and time.  Skin: Skin is warm and dry.  Psychiatric: She has a normal mood and affect.  Nursing note and vitals reviewed.         Assessment & Plan:  1.Lumbar pain lumbar spondylosis: Encouraged to increase Activity as tolerated and continue HEP. Refilled: Opana 30 mg one tablet every 12 hours as needed #60 and Tramadol 50 mg one tablet twice a day #60. We will continue the opioid monitoring program, this consists of regular clinic visits, examinations, urine drug screen, pill counts as well as use of [place] Controlled Substance Reporting System. 2.. Bilateral Knee Pain/ Degenerative: Continue Voltaren Gel. Continue to Monitor 3. Severe diabetic poly neuropathy: Continue Gabapentin. 4. Insomnia: On Trazodone PCP Following   20 minutes of face to face patient care time was spent during this visit. All questions were encouraged and answered.

## 2015-10-11 ENCOUNTER — Encounter: Payer: Self-pay | Admitting: Nutrition

## 2015-10-11 ENCOUNTER — Encounter: Payer: Medicaid Other | Attending: "Endocrinology | Admitting: Nutrition

## 2015-10-11 ENCOUNTER — Ambulatory Visit (INDEPENDENT_AMBULATORY_CARE_PROVIDER_SITE_OTHER): Payer: Medicaid Other | Admitting: "Endocrinology

## 2015-10-11 ENCOUNTER — Encounter: Payer: Self-pay | Admitting: "Endocrinology

## 2015-10-11 VITALS — Ht 62.0 in | Wt 236.0 lb

## 2015-10-11 VITALS — BP 138/76 | HR 90 | Ht 66.0 in | Wt 236.0 lb

## 2015-10-11 DIAGNOSIS — Z794 Long term (current) use of insulin: Principal | ICD-10-CM

## 2015-10-11 DIAGNOSIS — E785 Hyperlipidemia, unspecified: Secondary | ICD-10-CM | POA: Diagnosis not present

## 2015-10-11 DIAGNOSIS — E1159 Type 2 diabetes mellitus with other circulatory complications: Secondary | ICD-10-CM

## 2015-10-11 DIAGNOSIS — Z760 Encounter for issue of repeat prescription: Secondary | ICD-10-CM | POA: Diagnosis not present

## 2015-10-11 DIAGNOSIS — I10 Essential (primary) hypertension: Secondary | ICD-10-CM

## 2015-10-11 DIAGNOSIS — E1165 Type 2 diabetes mellitus with hyperglycemia: Secondary | ICD-10-CM

## 2015-10-11 DIAGNOSIS — E118 Type 2 diabetes mellitus with unspecified complications: Principal | ICD-10-CM

## 2015-10-11 DIAGNOSIS — IMO0002 Reserved for concepts with insufficient information to code with codable children: Secondary | ICD-10-CM

## 2015-10-11 DIAGNOSIS — E669 Obesity, unspecified: Secondary | ICD-10-CM

## 2015-10-11 NOTE — Progress Notes (Signed)
Subjective:    Patient ID: [name], female    DOB: 19-Feb-1951. Patient is being seen in f/u for management of diabetes.  Past Medical History:  Diagnosis Date  . Anxiety   . Anxiety and depression   . Arthritis   . CAD (coronary artery disease)    Stent placement circumflex coronary 2007, catheterization 2008 patent stents. Normal LV function  . Chest pain   . CHF (congestive heart failure) ([place])   . COPD (chronic obstructive pulmonary disease) ([place])   . Depression   . Diabetes mellitus    Insulin dependent  . Diabetic polyneuropathy (HCC)     severe on multiple medications  . Dyslipidemia   . GERD (gastroesophageal reflux disease)   . Headache(784.0)   . 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 MI (myocardial infarction) ([place])    2007  . Obstructive sleep apnea    CPAP machine at night  . Palpitations   . Tachycardia    Past Surgical History:  Procedure Laterality Date  . ABDOMINAL HYSTERECTOMY    . Arm Surgery Bilateral    due to fall-pt broke both forearms  . BIOPSY N/A 07/01/2013   Procedure: BIOPSY;  Surgeon: Rogene Houston, MD;  Location: [facility];  Service: Endoscopy;  Laterality: N/A;  . CHOLECYSTECTOMY    . COLONOSCOPY  08/06/2011   Procedure: COLONOSCOPY;  Surgeon: Rogene Houston, MD;  Location: [facility];  Service: Endoscopy;  Laterality: N/A;  215  . COLONOSCOPY WITH PROPOFOL N/A 12/31/2012   Procedure: COLONOSCOPY WITH PROPOFOL;  Surgeon: Rogene Houston, MD;  Location: [facility];  Service: Endoscopy;  Laterality: N/A;  in cecum at 0814, total withdrawal time 41min  . CORONARY ANGIOPLASTY WITH STENT PLACEMENT    . ESOPHAGEAL DILATION N/A 06/07/2015   Procedure: ESOPHAGEAL DILATION;  Surgeon: Rogene Houston, MD;  [facility];  Service: Endoscopy;  Laterality: N/A;  . ESOPHAGOGASTRODUODENOSCOPY N/A 06/07/2015   Procedure: ESOPHAGOGASTRODUODENOSCOPY (EGD);  Surgeon: Rogene Houston, MD;  [facility];  Service: Endoscopy;  Laterality: N/A;   2:45 - moved to 1:00 - Ann to notify pt  . ESOPHAGOGASTRODUODENOSCOPY (EGD) WITH PROPOFOL N/A 12/31/2012   Procedure: ESOPHAGOGASTRODUODENOSCOPY (EGD) WITH PROPOFOL;  Surgeon: Rogene Houston, MD;  Location: [facility];  Service: Endoscopy;  Laterality: N/A;  GE junction 37,  . ESOPHAGOGASTRODUODENOSCOPY (EGD) WITH PROPOFOL N/A 07/01/2013   Procedure: ESOPHAGOGASTRODUODENOSCOPY (EGD) WITH PROPOFOL;  Surgeon: Rogene Houston, MD;  Location: [facility];  Service: Endoscopy;  Laterality: N/A;  950  . MALONEY DILATION N/A 12/31/2012   Procedure: MALONEY DILATION;  Surgeon: Rogene Houston, MD;  Location: [facility];  Service: Endoscopy;  Laterality: N/A;  used a # 54,#56  . MALONEY DILATION N/A 07/01/2013   Procedure: Venia Minks DILATION;  Surgeon: Rogene Houston, MD;  Location: [facility];  Service: Endoscopy;  Laterality: N/A;  54/58; no heme present   Social History   Social History  . Marital status: Legally Separated    Spouse name: N/A  . Number of children: 4  . Years of education: N/A   Occupational History  . Disabled    Social History Main Topics  . Smoking status: Never Smoker  . Smokeless tobacco: Never Used  . Alcohol use No  . Drug use: No  . Sexual activity: Not Asked   Other Topics Concern  . None   Social History Narrative   Patient does not get regular exercise   Denies caffeine  use    Outpatient Encounter Prescriptions as of 10/11/2015  Medication Sig  . ALPRAZolam (XANAX) 1 MG tablet Take 1 tablet (1 mg total) by mouth 4 (four) times daily.  Marland Kitchen aspirin EC 81 MG tablet Take 81 mg by mouth daily.  Marland Kitchen atorvastatin (LIPITOR) 40 MG tablet Take 40 mg by mouth daily.  . beclomethasone (QVAR) 40 MCG/ACT inhaler Inhale 1 puff into the lungs 2 (two) times daily.  Marland Kitchen dicyclomine (BENTYL) 10 MG capsule TAKE 1 CAPSULE BY MOUTH TWICE DAILY AS NEEDED FOR SPASMS.  Marland Kitchen estrogens, conjugated, (PREMARIN) 1.25 MG tablet Take 1.25 mg by mouth daily.   . fluconazole (DIFLUCAN) 150 MG tablet Take 1 tablet  (150 mg total) by mouth once.  . fluticasone-salmeterol (ADVAIR HFA) 115-21 MCG/ACT inhaler Inhale 2 puffs into the lungs 2 (two) times daily.  Marland Kitchen gabapentin (NEURONTIN) 100 MG capsule Take 1 capsule (100 mg total) by mouth 3 (three) times daily.  Marland Kitchen glucose blood (ACCU-CHEK AVIVA) test strip Use to test 4 times a day  . ibuprofen (ADVIL,MOTRIN) 200 MG tablet Take 200 mg by mouth daily as needed for headache.  . Insulin Glargine (LANTUS SOLOSTAR Bee) Inject 40 Units into the skin at bedtime.  . Insulin Pen Needle (B-D ULTRAFINE III SHORT PEN) 31G X 8 MM MISC 1 each by Does not apply route as directed.  . INVOKANA 100 MG TABS tablet TAKE 1 TABLET BY MOUTH DAILY BEFORE BREAKFAST  . lidocaine (XYLOCAINE) 5 % ointment Apply 1 application topically at bedtime.   . metFORMIN (GLUCOPHAGE) 500 MG tablet Take 1 tablet (500 mg total) by mouth 2 (two) times daily with a meal.  . nitroGLYCERIN (NITROSTAT) 0.4 MG SL tablet Place 1 tablet (0.4 mg total) under the tongue every 5 (five) minutes x 3 doses as needed. For chest pains  . ondansetron (ZOFRAN) 4 MG tablet Take 4 mg by mouth every 6 (six) hours as needed for nausea or vomiting.  Marland Kitchen oxymorphone (OPANA ER) 30 MG 12 hr tablet Take 1 tablet (30 mg total) by mouth every 12 (twelve) hours.  . pantoprazole (PROTONIX) 40 MG tablet TAKE 1 TABLET BY MOUTH TWICE DAILY BEFORE A MEAL.  Marland Kitchen potassium chloride (K-DUR,KLOR-CON) 10 MEQ tablet TAKE 2 TABLETS BY MOUTH 2 TIMES DAILY.  Marland Kitchen psyllium (METAMUCIL SMOOTH TEXTURE) 28 % packet Take 1 packet by mouth at bedtime.  . ranitidine (ZANTAC) 300 MG tablet Take 300 mg by mouth 2 (two) times daily.  . TOPROL XL 50 MG 24 hr tablet TAKE 1 TABLET BY MOUTH DAILY WITH OR IMMEDIATELY FOLLOWING A MEAL.  Marland Kitchen traMADol (ULTRAM) 50 MG tablet Take 1 tablet (50 mg total) by mouth 2 (two) times daily.  . traZODone (DESYREL) 25 mg TABS tablet Take 0.5 tablets (25 mg total) by mouth at bedtime. (Patient taking differently: Take 50 mg by mouth at  bedtime. )  . VOLTAREN 1 % GEL APPLY 2 GRAMS ON BOTH KNEES THREE TIMES DAILY   No facility-administered encounter medications on file as of 10/11/2015.    ALLERGIES: Allergies  Allergen Reactions  . Nitrofuran Derivatives Itching and Swelling  . Amphetamine-Dextroamphet Er Swelling  . Pregabalin Swelling  . Topiramate Other (See Comments)    Tongue tingle  . Verelan [Verapamil] Rash   VACCINATION STATUS:  There is no immunization history on file for this patient.  Diabetes  She presents for her follow-up diabetic visit. She has type 2 diabetes mellitus. Onset time: She was diagnosed at approximate age of 65 years.  Her disease course has been stable. There are no hypoglycemic associated symptoms. Pertinent negatives for hypoglycemia include no confusion, headaches, pallor or seizures. Associated symptoms include fatigue. Pertinent negatives for diabetes include no chest pain, no polydipsia, no polyphagia and no polyuria. There are no hypoglycemic complications. Symptoms are stable. Diabetic complications include autonomic neuropathy, heart disease, peripheral neuropathy and retinopathy. Risk factors for coronary artery disease include dyslipidemia, diabetes mellitus, obesity, hypertension and sedentary lifestyle. Current diabetic treatments: Lantus 65 units daily at bedtime, Humalog 15 units 3 times a day before meals. She mentions allergy to Victoza, Byetta, and metformin. Her weight is decreasing steadily (She lost 23 pounds overall.). She is following a generally unhealthy diet. When asked about meal planning, she reported none. She has not had a previous visit with a dietitian. She never participates in exercise. Home blood sugar record trend: She did not bring her logs however her meter shows average of 1 50 g/dL for the last 7 days. Her breakfast blood glucose range is generally 140-180 mg/dl. Her overall blood glucose range is 140-180 mg/dl. An ACE inhibitor/angiotensin II receptor blocker  is not being taken. Eye exam is current.  Hyperlipidemia  This is a chronic problem. The current episode started more than 1 year ago. The problem is uncontrolled. Recent lipid tests were reviewed and are high. Exacerbating diseases include diabetes and obesity. Pertinent negatives include no chest pain, myalgias or shortness of breath. Current antihyperlipidemic treatment includes statins. Risk factors for coronary artery disease include diabetes mellitus, dyslipidemia, hypertension, obesity and a sedentary lifestyle.  Hypertension  This is a chronic problem. The current episode started more than 1 year ago. Pertinent negatives include no chest pain, headaches, palpitations or shortness of breath. Risk factors for coronary artery disease include diabetes mellitus, dyslipidemia, obesity and sedentary lifestyle. Hypertensive end-organ damage includes CAD/MI and retinopathy.    Review of Systems  Constitutional: Positive for fatigue. Negative for chills, fever and unexpected weight change.  HENT: Negative for trouble swallowing and voice change.   Eyes: Negative for visual disturbance.  Respiratory: Negative for cough, shortness of breath and wheezing.   Cardiovascular: Negative for chest pain, palpitations and leg swelling.  Gastrointestinal: Negative for diarrhea, nausea and vomiting.  Endocrine: Negative for cold intolerance, heat intolerance, polydipsia, polyphagia and polyuria.  Musculoskeletal: Negative for arthralgias and myalgias.  Skin: Negative for color change, pallor, rash and wound.  Neurological: Negative for seizures and headaches.  Psychiatric/Behavioral: Negative for confusion and suicidal ideas.    Objective:    BP 138/76   Pulse 90   Ht 5\' 6"  (1.676 m)   Wt 236 lb (107 kg)   BMI 38.09 kg/m   Wt Readings from Last 3 Encounters:  10/11/15 236 lb (107 kg)  10/11/15 236 lb (107 kg)  07/12/15 248 lb (112.5 kg)    Physical Exam  Constitutional: She is oriented to person,  place, and time. She appears well-developed.  HENT:  Head: Normocephalic and atraumatic.  She has very poor dentition.  Eyes: EOM are normal.  Neck: Normal range of motion. Neck supple. No tracheal deviation present. No thyromegaly present.  Cardiovascular: Normal rate and regular rhythm.   Pulmonary/Chest: Effort normal and breath sounds normal.  Abdominal: Soft. Bowel sounds are normal. There is no tenderness. There is no guarding.  Musculoskeletal: Normal range of motion. She exhibits no edema.  Neurological: She is alert and oriented to person, place, and time. She has normal reflexes. No cranial nerve deficit. Coordination normal.  Skin: Skin  is warm and dry. No rash noted. No erythema. No pallor.  Psychiatric: She has a normal mood and affect. Judgment normal.    CMP     Component Value Date/Time   NA 142 10/04/2015 1256   K 4.5 10/04/2015 1256   CL 101 10/04/2015 1256   CO2 26 10/04/2015 1256   GLUCOSE 156 (H) 10/04/2015 1256   GLUCOSE 161 (H) 07/05/2015 0937   BUN 11 10/04/2015 1256   CREATININE 0.71 10/04/2015 1256   CREATININE 0.76 07/05/2015 0937   CALCIUM 9.4 10/04/2015 1256   PROT 6.1 10/04/2015 1256   ALBUMIN 3.5 (L) 10/04/2015 1256   AST 10 10/04/2015 1256   ALT 7 10/04/2015 1256   ALKPHOS 92 10/04/2015 1256   BILITOT 0.5 10/04/2015 1256   GFRNONAA 92 10/04/2015 1256   GFRAA 106 10/04/2015 1256   Diabetic Labs (most recent): Lab Results  Component Value Date   HGBA1C 7.8 (H) 10/04/2015   HGBA1C 7.1 (H) 07/05/2015   HGBA1C 8.6 03/22/2015    Lipid Panel     Component Value Date/Time   CHOL 156 07/05/2015 0937   TRIG 267 (H) 07/05/2015 0937   HDL 58 07/05/2015 0937   CHOLHDL 2.7 07/05/2015 0937   VLDL 53 (H) 07/05/2015 0937   LDLCALC 45 07/05/2015 0937     Assessment & Plan:   1. Type 2 diabetes mellitus with vascular disease ([place])  - Patient has currently uncontrolled symptomatic type 2 DM since  65 years of age,  with most recent A1c Stable at   7.8% ,  generally improving from 8.6 %. Recent labs reviewed. -She came with tightly controlled blood glucose profile mainly because she did use Lantus 30 units 3 times a day.    Her diabetes is complicated by coronary artery disease status post stent placement and patient remains at a high risk for more acute and chronic complications of diabetes which include CAD, CVA, CKD, retinopathy, and neuropathy. These are all discussed in detail with the patient.  - I have counseled the patient on diet management and weight loss, by adopting a carbohydrate restricted/protein rich diet. - She came with overall weight loss of 23 pounds since February 2017. - Suggestion is made for patient to avoid simple carbohydrates   from their diet including Cakes , Desserts, Ice Cream,  Soda (  diet and regular) , Sweet Tea , Candies,  Chips, Cookies, Artificial Sweeteners,   and "Sugar-free" Products . This will help patient to have stable blood glucose profile and potentially avoid unintended weight gain.  - I encouraged the patient to switch to  unprocessed or minimally processed complex starch and increased protein intake (animal or plant source), fruits, and vegetables.  - Patient is advised to stick to a routine mealtimes to eat 3 meals  a day and avoid unnecessary snacks ( to snack only to correct hypoglycemia).  - The patient will be scheduled with Jearld Fenton, RDN, CDE for individualized DM education.  - I have approached patient with the following individualized plan to manage diabetes and patient agrees:   - She has normal renal function. -I will proceed  to increase Lantus to 40 units daily at bedtime and  continue to hold off prandial insulin Humalog for now .    - I advised her to continue monitoring of blood glucose before meals breakfast and at bedtime.  - Patient is warned not to take insulin without proper monitoring per orders. -Adjustment parameters are given for hypo and hyperglycemia  in  writing. -Patient is encouraged to call clinic for blood glucose levels less than 70 or above 200 mg /dl. -She mentions allergy to a number of use for medications unfortunately, Victoza, Byetta. - She is  tolerating  metformin , I will continue  metformin 500 mg by mouth twice a day. -She will continue Invokana 100 mg by mouth every morning. -Side effects and precautions discussed with hershe was treated for yeast infection with Diflucan once after her last visit. - Patient specific target  A1c;  LDL, HDL, Triglycerides, and  Waist Circumference were discussed in detail.  2) BP/HTN: Controlled. Continue current medications . 3) Lipids/HPL:  Uncontrolled with triglycerides of 267 improving from  902, continue  Lipitor 40 mg by mouth daily at bedtime. Better control of diabetes will help for triglycerides. 4)  Weight/Diet: CDE Consult in progress  , exercise, and detailed carbohydrates information provided.  5) Chronic Care/Health Maintenance:  -Patient is on Statin medications and encouraged to continue to follow up with Ophthalmology, Podiatrist at least yearly or according to recommendations, and advised to   stay away from smoking. I have recommended yearly flu vaccine and pneumonia vaccination at least every 5 years; moderate intensity exercise for up to 150 minutes weekly; and  sleep for at least 7 hours a day.  - 30 minutes of time was spent on the care of this patient , 50% of which was applied for counseling on diabetes complications and their preventions.  - Patient to bring meter and  blood glucose logs during their next visit.   - I advised patient to maintain close follow up with Glenda Chroman, MD for primary care needs.  Follow up plan: - Return in about 3 months (around 01/11/2016) for follow up with pre-visit labs, meter, and logs.  Glade Lloyd, MD Phone: [phone]  Fax: [phone]   10/11/2015, 1:46 PM

## 2015-10-11 NOTE — Progress Notes (Signed)
  Medical Nutrition Therapy:  Appt start time: 1430 end time:  1500.  Assessment:  Primary concerns today: Diabetes. Type 2. Here with her boyfriend. Still has a CNA. A1C 7.8%. Lost 23 lbs since three months. Cut out snacks, junk food and sodas. Only drinking water. Eating three meals per day. Exercising by walking  Three days per week 30 minutes. .Metformin 500 mg BID.  Dr. Dorris Fetch increased her Lantus back to 40 units/d.Marland Kitchen Metformin 500 mg/dl. Diet is much better balanced and more consistent with carbs  and more lower carb vegetables.  Still needs more lower carb vegetables when she can afford them.  Lab Results  Component Value Date   HGBA1C 7.8 (H) 10/04/2015   Preferred Learning Style:   No preference indicated   Learning Readiness:   Ready  Change in progress   MEDICATIONS: See list   DIETARY INTAKE:     24-hr recall:  Breakast: Egg mcmuffin, water Lunch: [place] sandwich or potpie, water Dinner: Grilled cheese sandwich, SF jello, water  Snack none Drinks: water  Usual physical activity: ADL;   Estimated energy needs: 1200 calories 135 g carbohydrates 90 g protein 33 g fat  Progress Towards Goal(s):  In progress.   Nutritional Diagnosis:  NB-1.1 Food and nutrition-related knowledge deficit As related to diabetes  As evidenced by A1C 8.6%. .    Intervention:  .Nutrition and Diabetes education provided on My Plate, CHO counting, meal planning, portion sizes, timing of meals, avoiding snacks between meals unless having a low blood sugar, target ranges for A1C and blood sugars, signs/symptoms and treatment of hyper/hypoglycemia, monitoring blood sugars, taking medications as prescribed, benefits of exercising 30 minutes per day and prevention of complications of DM.  Goals Keep up the good job!  Goals 1 Give 40 units of Lantus daily. 2. Increase fresh fruits and vegetables 3.  Exercise 45 minutes 3-4 times per week.  Teaching Method Utilized:   Visual Auditory Hands on  Handouts given during visit include:  The Plate Method   Meal Plan Card  Diabetes Instructions.   Barriers to learning/adherence to lifestyle change: None  Demonstrated degree of understanding via:  Teach Back   Monitoring/Evaluation:  Dietary intake, exercise, meal planning SBG, and body weight in 3 month(s).

## 2015-10-11 NOTE — Patient Instructions (Addendum)
Goals 1 Give 40 units of Lantus daily. 2. Increase fresh fruits and vegetables 3.  Exercise 45 minutes 3-4 times per week.

## 2015-10-16 ENCOUNTER — Institutional Professional Consult (permissible substitution): Payer: Medicaid Other | Admitting: Pulmonary Disease

## 2015-10-18 ENCOUNTER — Institutional Professional Consult (permissible substitution): Payer: Medicaid Other | Admitting: Pulmonary Disease

## 2015-10-22 ENCOUNTER — Encounter: Payer: Self-pay | Admitting: *Deleted

## 2015-10-23 ENCOUNTER — Ambulatory Visit: Payer: Medicaid Other | Admitting: Cardiovascular Disease

## 2015-10-26 ENCOUNTER — Ambulatory Visit: Payer: Medicaid Other | Admitting: Cardiovascular Disease

## 2015-10-26 ENCOUNTER — Other Ambulatory Visit: Payer: Self-pay | Admitting: "Endocrinology

## 2015-10-26 ENCOUNTER — Other Ambulatory Visit (INDEPENDENT_AMBULATORY_CARE_PROVIDER_SITE_OTHER): Payer: Self-pay | Admitting: Internal Medicine

## 2015-10-26 ENCOUNTER — Ambulatory Visit (INDEPENDENT_AMBULATORY_CARE_PROVIDER_SITE_OTHER): Payer: Medicaid Other | Admitting: Cardiovascular Disease

## 2015-10-26 ENCOUNTER — Encounter: Payer: Self-pay | Admitting: Cardiovascular Disease

## 2015-10-26 VITALS — BP 146/80 | HR 85 | Ht 62.0 in | Wt 235.0 lb

## 2015-10-26 DIAGNOSIS — E785 Hyperlipidemia, unspecified: Secondary | ICD-10-CM | POA: Diagnosis not present

## 2015-10-26 DIAGNOSIS — I5032 Chronic diastolic (congestive) heart failure: Secondary | ICD-10-CM

## 2015-10-26 DIAGNOSIS — I10 Essential (primary) hypertension: Secondary | ICD-10-CM | POA: Diagnosis not present

## 2015-10-26 DIAGNOSIS — I25118 Atherosclerotic heart disease of native coronary artery with other forms of angina pectoris: Secondary | ICD-10-CM

## 2015-10-26 NOTE — Progress Notes (Signed)
SUBJECTIVE: [name] has a history of an MI in 2007 with PCI of the circumflex. She also has IDDM, sleep apnea, irritable bowel syndrome, GERD, chronic diastolic heart failure, tachycardia-palpitations, and dyslipidemia.  She says she was taken off lisinopril due to low blood pressure by her PCP. She reports prior systolic readings in the 923 range. She has chest pain for which she takes nitroglycerin once every 3-4 months. She had some yesterday. She denies consistent exertional chest pain.  ECG performed in the office today which I personally reviewed demonstrates normal sinus rhythm with no ischemic ST segment or T-wave abnormalities, nor any arrhythmias.    Review of Systems: As per "subjective", otherwise negative.  Allergies  Allergen Reactions  . Nitrofuran Derivatives Itching and Swelling  . Amphetamine-Dextroamphet Er Swelling  . Pregabalin Swelling  . Topiramate Other (See Comments)    Tongue tingle  . Verelan [Verapamil] Rash    Current Outpatient Prescriptions  Medication Sig Dispense Refill  . ALPRAZolam (XANAX) 1 MG tablet Take 1 tablet (1 mg total) by mouth 4 (four) times daily. 30 tablet 3  . aspirin EC 81 MG tablet Take 81 mg by mouth daily.    Marland Kitchen atorvastatin (LIPITOR) 40 MG tablet Take 40 mg by mouth daily.    . beclomethasone (QVAR) 40 MCG/ACT inhaler Inhale 1 puff into the lungs 2 (two) times daily.    Marland Kitchen dicyclomine (BENTYL) 10 MG capsule TAKE 1 CAPSULE BY MOUTH TWICE DAILY AS NEEDED FOR SPASMS. 60 capsule 5  . estrogens, conjugated, (PREMARIN) 1.25 MG tablet Take 1.25 mg by mouth daily.     . fluconazole (DIFLUCAN) 150 MG tablet Take 1 tablet (150 mg total) by mouth once. 1 tablet 0  . fluticasone-salmeterol (ADVAIR HFA) 115-21 MCG/ACT inhaler Inhale 2 puffs into the lungs 2 (two) times daily.    Marland Kitchen gabapentin (NEURONTIN) 100 MG capsule Take 1 capsule (100 mg total) by mouth 3 (three) times daily. 90 capsule 6  . glucose blood (ACCU-CHEK AVIVA) test  strip Use to test 4 times a day 150 each 3  . ibuprofen (ADVIL,MOTRIN) 200 MG tablet Take 200 mg by mouth daily as needed for headache.    . Insulin Glargine (LANTUS SOLOSTAR [place]) Inject 40 Units into the skin at bedtime.    . Insulin Pen Needle (B-D ULTRAFINE III SHORT PEN) 31G X 8 MM MISC 1 each by Does not apply route as directed. 100 each 3  . INVOKANA 100 MG TABS tablet TAKE 1 TABLET BY MOUTH DAILY BEFORE BREAKFAST 30 tablet 2  . lidocaine (XYLOCAINE) 5 % ointment Apply 1 application topically at bedtime.     . metFORMIN (GLUCOPHAGE) 500 MG tablet TAKE ONE TABLET BY MOUTH TWICE DAILY WITH A MEAL 60 tablet 2  . nitroGLYCERIN (NITROSTAT) 0.4 MG SL tablet Place 1 tablet (0.4 mg total) under the tongue every 5 (five) minutes x 3 doses as needed. For chest pains 25 tablet 3  . ondansetron (ZOFRAN) 4 MG tablet Take 4 mg by mouth every 6 (six) hours as needed for nausea or vomiting.    Marland Kitchen oxymorphone (OPANA ER) 30 MG 12 hr tablet Take 1 tablet (30 mg total) by mouth every 12 (twelve) hours. 60 tablet 0  . pantoprazole (PROTONIX) 40 MG tablet TAKE 1 TABLET BY MOUTH TWICE DAILY BEFORE MEALS 60 tablet 5  . potassium chloride (K-DUR,KLOR-CON) 10 MEQ tablet TAKE 2 TABLETS BY MOUTH 2 TIMES DAILY. 120 tablet 6  . psyllium (METAMUCIL SMOOTH  TEXTURE) 28 % packet Take 1 packet by mouth at bedtime.    . ranitidine (ZANTAC) 300 MG tablet Take 300 mg by mouth 2 (two) times daily.    . TOPROL XL 50 MG 24 hr tablet TAKE 1 TABLET BY MOUTH DAILY WITH OR IMMEDIATELY FOLLOWING A MEAL. 30 tablet 6  . traMADol (ULTRAM) 50 MG tablet Take 1 tablet (50 mg total) by mouth 2 (two) times daily. 60 tablet 3  . traZODone (DESYREL) 25 mg TABS tablet Take 0.5 tablets (25 mg total) by mouth at bedtime. (Patient taking differently: Take 50 mg by mouth at bedtime. ) 30 tablet 2  . VOLTAREN 1 % GEL APPLY 2 GRAMS ON BOTH KNEES THREE TIMES DAILY 300 g 2   No current facility-administered medications for this visit.     Past Medical  History:  Diagnosis Date  . Anxiety   . Anxiety and depression   . Arthritis   . CAD (coronary artery disease)    Stent placement circumflex coronary 2007, catheterization 2008 patent stents. Normal LV function  . Chest pain   . CHF (congestive heart failure) ([place])   . COPD (chronic obstructive pulmonary disease) ([place])   . Depression   . Diabetes mellitus    Insulin dependent  . Diabetic polyneuropathy (HCC)     severe on multiple medications  . Dyslipidemia   . GERD (gastroesophageal reflux disease)   . Headache(784.0)   . 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 MI (myocardial infarction) ([place])    2007  . Obstructive sleep apnea    CPAP machine at night  . Palpitations   . Tachycardia     Past Surgical History:  Procedure Laterality Date  . ABDOMINAL HYSTERECTOMY    . Arm Surgery Bilateral    due to fall-pt broke both forearms  . BIOPSY N/A 07/01/2013   Procedure: BIOPSY;  Surgeon: Rogene Houston, MD;  Location: [facility];  Service: Endoscopy;  Laterality: N/A;  . CHOLECYSTECTOMY    . COLONOSCOPY  08/06/2011   Procedure: COLONOSCOPY;  Surgeon: Rogene Houston, MD;  Location: [facility];  Service: Endoscopy;  Laterality: N/A;  215  . COLONOSCOPY WITH PROPOFOL N/A 12/31/2012   Procedure: COLONOSCOPY WITH PROPOFOL;  Surgeon: Rogene Houston, MD;  Location: [facility];  Service: Endoscopy;  Laterality: N/A;  in cecum at 0814, total withdrawal time 64mn  . CORONARY ANGIOPLASTY WITH STENT PLACEMENT    . ESOPHAGEAL DILATION N/A 06/07/2015   Procedure: ESOPHAGEAL DILATION;  Surgeon: NRogene Houston MD;  [facility];  Service: Endoscopy;  Laterality: N/A;  . ESOPHAGOGASTRODUODENOSCOPY N/A 06/07/2015   Procedure: ESOPHAGOGASTRODUODENOSCOPY (EGD);  Surgeon: NRogene Houston MD;  [facility];  Service: Endoscopy;  Laterality: N/A;  2:45 - moved to 1:00 - Ann to notify pt  . ESOPHAGOGASTRODUODENOSCOPY (EGD) WITH PROPOFOL N/A 12/31/2012   Procedure: ESOPHAGOGASTRODUODENOSCOPY  (EGD) WITH PROPOFOL;  Surgeon: NRogene Houston MD;  Location: [facility];  Service: Endoscopy;  Laterality: N/A;  GE junction 37,  . ESOPHAGOGASTRODUODENOSCOPY (EGD) WITH PROPOFOL N/A 07/01/2013   Procedure: ESOPHAGOGASTRODUODENOSCOPY (EGD) WITH PROPOFOL;  Surgeon: NRogene Houston MD;  Location: [facility];  Service: Endoscopy;  Laterality: N/A;  950  . MALONEY DILATION N/A 12/31/2012   Procedure: MALONEY DILATION;  Surgeon: NRogene Houston MD;  Location: [facility];  Service: Endoscopy;  Laterality: N/A;  used a # 54,#56  . MALONEY DILATION N/A 07/01/2013   Procedure: MVenia MinksDILATION;  Surgeon: NRogene Houston MD;  Location: [facility];  Service: Endoscopy;  Laterality: N/A;  54/58; no heme present    Social History   Social History  . Marital status: Legally Separated    Spouse name: N/A  . Number of children: 4  . Years of education: N/A   Occupational History  . Disabled    Social History Main Topics  . Smoking status: Never Smoker  . Smokeless tobacco: Never Used  . Alcohol use No  . Drug use: No  . Sexual activity: Not on file   Other Topics Concern  . Not on file   Social History Narrative   Patient does not get regular exercise   Denies caffeine use      Vitals:   10/26/15 1417  BP: (!) 146/80  Pulse: 85  SpO2: 95%  Weight: 235 lb (106.6 kg)  Height: 5' 2"  (1.575 m)    PHYSICAL EXAM General: NAD HEENT: Normal. Neck: No JVD, no thyromegaly. Lungs: Clear to auscultation bilaterally with normal respiratory effort. CV: Nondisplaced PMI.  Regular rate and rhythm, normal S1/S2, no S3/S4, no murmur. No pretibial or periankle edema.  No carotid bruit.   Abdomen: Soft, obese. Neurologic: Alert and oriented.  Psych: Normal affect. Skin: Normal. Musculoskeletal: No gross deformities.    ECG: Most recent ECG reviewed.      ASSESSMENT AND PLAN: 1. CAD: Stable ischemic heart disease. Continue aspirin, Lipitor, and metoprolol.  2. Chronic diastolic heart failure:  Euvolemic. No changes. Not on diuretic therapy.  3. Essential hypertension: Elevated. Given h/o MI and diabetes, ACEI/ARB is warranted. Says PCP took her off due to low BP. This may need to be reinitiated.  4. Hyperlipidemia: Continue Lipitor.  Dispo: fu 1 year.  Kate Sable, M.D., F.A.C.C.

## 2015-10-26 NOTE — Patient Instructions (Signed)
Your physician wants you to follow-up in: 1 year You will receive a reminder letter in the mail two months in advance. If you don't receive a letter, please call our office to schedule the follow-up appointment.   Your physician recommends that you continue on your current medications as directed. Please refer to the Current Medication list given to you today.    If you need a refill on your cardiac medications before your next appointment, please call your pharmacy.      Thank you for choosing [facility] !        

## 2015-11-06 ENCOUNTER — Ambulatory Visit (INDEPENDENT_AMBULATORY_CARE_PROVIDER_SITE_OTHER): Payer: Medicaid Other | Admitting: Sports Medicine

## 2015-11-06 ENCOUNTER — Ambulatory Visit (INDEPENDENT_AMBULATORY_CARE_PROVIDER_SITE_OTHER): Payer: Medicaid Other

## 2015-11-06 DIAGNOSIS — M79672 Pain in left foot: Secondary | ICD-10-CM

## 2015-11-06 DIAGNOSIS — T148 Other injury of unspecified body region: Secondary | ICD-10-CM | POA: Diagnosis not present

## 2015-11-06 DIAGNOSIS — L97501 Non-pressure chronic ulcer of other part of unspecified foot limited to breakdown of skin: Secondary | ICD-10-CM | POA: Diagnosis not present

## 2015-11-06 DIAGNOSIS — S93602A Unspecified sprain of left foot, initial encounter: Secondary | ICD-10-CM | POA: Diagnosis not present

## 2015-11-06 DIAGNOSIS — IMO0002 Reserved for concepts with insufficient information to code with codable children: Secondary | ICD-10-CM

## 2015-11-06 NOTE — Progress Notes (Signed)
   Subjective:    Patient ID: [name], female    DOB: [birth year], 65 y.o.   MRN: [id number]  HPI Chief Complaint  Patient presents with  . Toe Pain    INJURED GREAT TOE AND HAVE BLISTERS/SORE FOR 1 WEEK. TOE IS SAME NOT WORSE AND TRIED NEOSPORIN-NO RELIEF      Review of Systems     Objective:   Physical Exam        Assessment & Plan:

## 2015-11-06 NOTE — Progress Notes (Signed)
Subjective: [name] is a 65 y.o. female patient seen in office for evaluation of Left foot pain that started 1 week ago after falling down after taking out trash. Reports went to ER where they took xrays and saw nothing wrong. Reports pain with touch underneath her left foot and with movement of her toes. Patient has been using neosporin to blistered skin. Report that she is on antibiotics and pain medication from her pain management doctors. Denies nausea/fever/vomiting/chills/night sweats/shortness of breath. Patient has no other pedal complaints at this time.  Patient Active Problem List   Diagnosis Date Noted  . Essential hypertension, benign 05/14/2015  . Morbid obesity due to excess calories ([place]) 05/02/2015  . Dysphagia 04/24/2015  . Arthrosis of right acromioclavicular joint 11/13/2014  . Spondylosis of lumbar region without myelopathy or radiculopathy 11/13/2014  . Painful diabetic neuropathy ([place]) 06/26/2014  . Diarrhea 03/02/2013  . Rectal irritation 03/02/2013  . Lumbago 07/29/2012  . Muscle weakness (generalized) 07/29/2012  . Spinal stenosis, lumbar region, without neurogenic claudication 07/19/2012  . Irritable bowel syndrome 08/24/2011  . Pancreatitis 10/17/2010  . CAD (coronary artery disease)   . Type 2 diabetes mellitus with vascular disease ([place])   . Diabetic polyneuropathy ([place])   . Anxiety and depression   . Hyperlipidemia 09/20/2009  . Chronic diastolic heart failure ([place]) 09/20/2009  . OBSTRUCTIVE SLEEP APNEA 09/04/2009  . TACHYCARDIA 11/08/2008  . PALPITATIONS 11/08/2008  . CHEST PAIN-UNSPECIFIED 11/08/2008   Current Outpatient Prescriptions on File Prior to Visit  Medication Sig Dispense Refill  . ALPRAZolam (XANAX) 1 MG tablet Take 1 tablet (1 mg total) by mouth 4 (four) times daily. 30 tablet 3  . aspirin EC 81 MG tablet Take 81 mg by mouth daily.    Marland Kitchen atorvastatin (LIPITOR) 40 MG tablet Take 40 mg by mouth daily.    . beclomethasone (QVAR) 40  MCG/ACT inhaler Inhale 1 puff into the lungs 2 (two) times daily.    Marland Kitchen dicyclomine (BENTYL) 10 MG capsule TAKE 1 CAPSULE BY MOUTH TWICE DAILY AS NEEDED FOR SPASMS. 60 capsule 5  . estrogens, conjugated, (PREMARIN) 1.25 MG tablet Take 1.25 mg by mouth daily.     . fluconazole (DIFLUCAN) 150 MG tablet Take 1 tablet (150 mg total) by mouth once. 1 tablet 0  . fluticasone-salmeterol (ADVAIR HFA) 115-21 MCG/ACT inhaler Inhale 2 puffs into the lungs 2 (two) times daily.    Marland Kitchen gabapentin (NEURONTIN) 100 MG capsule Take 1 capsule (100 mg total) by mouth 3 (three) times daily. 90 capsule 6  . glucose blood (ACCU-CHEK AVIVA) test strip Use to test 4 times a day 150 each 3  . ibuprofen (ADVIL,MOTRIN) 200 MG tablet Take 200 mg by mouth daily as needed for headache.    . Insulin Glargine (LANTUS SOLOSTAR [place]) Inject 40 Units into the skin at bedtime.    . Insulin Pen Needle (B-D ULTRAFINE III SHORT PEN) 31G X 8 MM MISC 1 each by Does not apply route as directed. 100 each 3  . INVOKANA 100 MG TABS tablet TAKE 1 TABLET BY MOUTH DAILY BEFORE BREAKFAST 30 tablet 2  . lidocaine (XYLOCAINE) 5 % ointment Apply 1 application topically at bedtime.     . metFORMIN (GLUCOPHAGE) 500 MG tablet TAKE ONE TABLET BY MOUTH TWICE DAILY WITH A MEAL 60 tablet 2  . nitroGLYCERIN (NITROSTAT) 0.4 MG SL tablet Place 1 tablet (0.4 mg total) under the tongue every 5 (five) minutes x 3 doses as needed. For chest pains 25  tablet 3  . ondansetron (ZOFRAN) 4 MG tablet Take 4 mg by mouth every 6 (six) hours as needed for nausea or vomiting.    Marland Kitchen oxymorphone (OPANA ER) 30 MG 12 hr tablet Take 1 tablet (30 mg total) by mouth every 12 (twelve) hours. 60 tablet 0  . pantoprazole (PROTONIX) 40 MG tablet TAKE 1 TABLET BY MOUTH TWICE DAILY BEFORE MEALS 60 tablet 5  . potassium chloride (K-DUR,KLOR-CON) 10 MEQ tablet TAKE 2 TABLETS BY MOUTH 2 TIMES DAILY. 120 tablet 6  . psyllium (METAMUCIL SMOOTH TEXTURE) 28 % packet Take 1 packet by mouth at bedtime.     . ranitidine (ZANTAC) 300 MG tablet Take 300 mg by mouth 2 (two) times daily.    . TOPROL XL 50 MG 24 hr tablet TAKE 1 TABLET BY MOUTH DAILY WITH OR IMMEDIATELY FOLLOWING A MEAL. 30 tablet 6  . traMADol (ULTRAM) 50 MG tablet Take 1 tablet (50 mg total) by mouth 2 (two) times daily. 60 tablet 3  . traZODone (DESYREL) 25 mg TABS tablet Take 0.5 tablets (25 mg total) by mouth at bedtime. (Patient taking differently: Take 50 mg by mouth at bedtime. ) 30 tablet 2  . VOLTAREN 1 % GEL APPLY 2 GRAMS ON BOTH KNEES THREE TIMES DAILY 300 g 2  . [DISCONTINUED] diltiazem (TIAZAC) 180 MG 24 hr capsule Take 1 capsule (180 mg total) by mouth daily. 30 capsule 1  . [DISCONTINUED] potassium chloride (K-DUR) 10 MEQ tablet Take 2 tablets (20 mEq total) by mouth 2 (two) times daily.     No current facility-administered medications on file prior to visit.    Allergies  Allergen Reactions  . Nitrofuran Derivatives Itching and Swelling  . Amphetamine-Dextroamphet Er Swelling  . Pregabalin Swelling  . Topiramate Other (See Comments)    Tongue tingle  . Verelan [Verapamil] Rash    Recent Results (from the past 2160 hour(s))  ToxAssure Select Plus     Status: None   Collection Time: 09/10/15  1:10 AM  Result Value Ref Range   ToxASSURE Select (Plus) FINAL     Comment: ==================================================================== TOXASSURE SELECT,+ANTIDEPR,UR ==================================================================== Test                             Result       Flag       Units Drug Present and Declared for Prescription Verification   Oxymorphone                    4237         EXPECTED   ng/mg creat    Sources of oxymorphone include scheduled prescription    medications; it is also an expected metabolite of oxycodone.   Tramadol                       PRESENT      EXPECTED   O-Desmethyltramadol            PRESENT      EXPECTED   N-Desmethyltramadol            PRESENT      EXPECTED     Source of tramadol is a prescription medication.    O-desmethyltramadol and N-desmethyltramadol are expected    metabolites of tramadol. Drug Present not Declared for Prescription Verification   Alprazolam  210          UNEXPECTED ng/mg creat   Alpha-hydroxyalprazolam        329          UNEXPECTED ng/mg creat    Sour ce of alprazolam is a scheduled prescription medication.    Alpha-hydroxyalprazolam is an expected metabolite of alprazolam.   Bupropion                      PRESENT      UNEXPECTED   Citalopram                     PRESENT      UNEXPECTED   Desmethylcitalopram            PRESENT      UNEXPECTED    Desmethylcitalopram is an expected metabolite of citalopram or    the enantiomeric form, escitalopram.   Trazodone                      PRESENT      UNEXPECTED   1,3 chlorophenyl piperazine    PRESENT      UNEXPECTED    1,3-chlorophenyl piperazine is an expected metabolite of    trazodone. ==================================================================== Test                      Result    Flag   Units      Ref Range   Creatinine              41               mg/dL      >=20 ==================================================================== Declared Medications:  The flagging and interpretation on this report are based on the  following declared medications.  Unexpected  results may arise from  inaccuracies in the declared medications.  **Note: The testing scope of this panel includes these medications:  Oxymorphone (Opana)  Tramadol ==================================================================== For clinical consultation, please call [phone]. ====================================================================    PDF .   Comprehensive metabolic panel     Status: Abnormal   Collection Time: 10/04/15 12:56 PM  Result Value Ref Range   Glucose 156 (H) 65 - 99 mg/dL   BUN 11 8 - 27 mg/dL   Creatinine, Ser 0.71 0.57 - 1.00 mg/dL   GFR  calc non Af Amer 92 >59 mL/min/1.73   GFR calc Af Amer 106 >59 mL/min/1.73   BUN/Creatinine Ratio 15 12 - 28   Sodium 142 134 - 144 mmol/L   Potassium 4.5 3.5 - 5.2 mmol/L   Chloride 101 96 - 106 mmol/L   CO2 26 18 - 29 mmol/L   Calcium 9.4 8.7 - 10.3 mg/dL   Total Protein 6.1 6.0 - 8.5 g/dL   Albumin 3.5 (L) 3.6 - 4.8 g/dL   Globulin, Total 2.6 1.5 - 4.5 g/dL   Albumin/Globulin Ratio 1.3 1.2 - 2.2   Bilirubin Total 0.5 0.0 - 1.2 mg/dL   Alkaline Phosphatase 92 39 - 117 IU/L   AST 10 0 - 40 IU/L   ALT 7 0 - 32 IU/L  Hgb A1c w/o eAG     Status: Abnormal   Collection Time: 10/04/15 12:56 PM  Result Value Ref Range   Hgb A1c MFr Bld 7.8 (H) 4.8 - 5.6 %    Comment:          Pre-diabetes: 5.7 - 6.4  Diabetes: >6.4          Glycemic control for adults with diabetes: <7.0     Objective: There were no vitals filed for this visit.  General: Patient is awake, alert, oriented x 3 and in no acute distress.  Dermatology: Skin is warm and dry bilateral with dry blister with superifical laceration and friable skin sub met 1 and plantar hallux on left . There is no malodor, no active drainage, no erythema, no edema. No acute signs of infection.   Vascular: Dorsalis Pedis pulse = 1/4 Bilateral,  Posterior Tibial pulse = 1/4 Bilateral,  Capillary Fill Time < 5 seconds  Neurologic: Protective sensation diminished bilateral using  the 5.07/10g Semmes Weinstein Monofilament.  Musculosketal: There is decreased ankle joint range of motion Bilateral. There is decreased Subtalar joint range of motion Bilateral. There is a decrease in 1st metatarsophalangeal joint range of motion Bilateral with tenderness to Left 1st MTPJ. No Pain with palpation to blistered/lacerated area on left plantar foot. No pain with compression to calves bilateral. No symptomatic gross bony deformities noted bilateral.  Xrays, Left foot:Decreased osseous mineralization, Bone spurs, No fracture/dislocation, No bony  destruction suggestive of osteomyelitis. No gas in soft tissues. No other acute findings.   Assessment and Plan:  Problem List Items Addressed This Visit    None    Visit Diagnoses    Foot pain, left    -  Primary   Relevant Orders   DG Foot Complete Left   Blister without infection       Sprain of foot, left, initial encounter           -Examined patient and discussed the progression of the blister with possible sprain secondary to trip and fall and treatment alternatives. -Xrays reviewed - Using a tissue nipper removed free hanging blistered skin -Applied topical antibiotic cream and dry sterile dressing and instructed patient to continue with daily dressings at home consisting of the same - Advised patient to go to the ER or return to office if the blister worsens or if constitutional symptoms are present. -Dispensed post op shoe to use until symptoms are better -Cont with antibiotics and pain meds from pain management -Patient to return to office in 2-3 weeks for follow up care and evaluation or sooner if problems arise.  Landis Martins, DPM

## 2015-11-12 ENCOUNTER — Encounter: Payer: Medicaid Other | Attending: Physical Medicine & Rehabilitation | Admitting: Registered Nurse

## 2015-11-12 ENCOUNTER — Encounter: Payer: Self-pay | Admitting: Registered Nurse

## 2015-11-12 VITALS — BP 105/70 | HR 77

## 2015-11-12 DIAGNOSIS — M79604 Pain in right leg: Secondary | ICD-10-CM | POA: Insufficient documentation

## 2015-11-12 DIAGNOSIS — G4733 Obstructive sleep apnea (adult) (pediatric): Secondary | ICD-10-CM | POA: Diagnosis not present

## 2015-11-12 DIAGNOSIS — I252 Old myocardial infarction: Secondary | ICD-10-CM | POA: Insufficient documentation

## 2015-11-12 DIAGNOSIS — G4700 Insomnia, unspecified: Secondary | ICD-10-CM | POA: Diagnosis not present

## 2015-11-12 DIAGNOSIS — M47816 Spondylosis without myelopathy or radiculopathy, lumbar region: Secondary | ICD-10-CM

## 2015-11-12 DIAGNOSIS — M79605 Pain in left leg: Secondary | ICD-10-CM | POA: Insufficient documentation

## 2015-11-12 DIAGNOSIS — G8929 Other chronic pain: Secondary | ICD-10-CM | POA: Diagnosis not present

## 2015-11-12 DIAGNOSIS — R000 Tachycardia, unspecified: Secondary | ICD-10-CM | POA: Diagnosis not present

## 2015-11-12 DIAGNOSIS — R002 Palpitations: Secondary | ICD-10-CM | POA: Diagnosis not present

## 2015-11-12 DIAGNOSIS — I509 Heart failure, unspecified: Secondary | ICD-10-CM | POA: Diagnosis not present

## 2015-11-12 DIAGNOSIS — E1140 Type 2 diabetes mellitus with diabetic neuropathy, unspecified: Secondary | ICD-10-CM

## 2015-11-12 DIAGNOSIS — Z794 Long term (current) use of insulin: Secondary | ICD-10-CM | POA: Diagnosis not present

## 2015-11-12 DIAGNOSIS — Z5181 Encounter for therapeutic drug level monitoring: Secondary | ICD-10-CM

## 2015-11-12 DIAGNOSIS — M1990 Unspecified osteoarthritis, unspecified site: Secondary | ICD-10-CM | POA: Diagnosis not present

## 2015-11-12 DIAGNOSIS — M5489 Other dorsalgia: Secondary | ICD-10-CM | POA: Insufficient documentation

## 2015-11-12 DIAGNOSIS — Z760 Encounter for issue of repeat prescription: Secondary | ICD-10-CM | POA: Insufficient documentation

## 2015-11-12 DIAGNOSIS — G894 Chronic pain syndrome: Secondary | ICD-10-CM | POA: Diagnosis not present

## 2015-11-12 DIAGNOSIS — E1142 Type 2 diabetes mellitus with diabetic polyneuropathy: Secondary | ICD-10-CM | POA: Diagnosis not present

## 2015-11-12 DIAGNOSIS — J449 Chronic obstructive pulmonary disease, unspecified: Secondary | ICD-10-CM | POA: Diagnosis not present

## 2015-11-12 DIAGNOSIS — F418 Other specified anxiety disorders: Secondary | ICD-10-CM | POA: Diagnosis not present

## 2015-11-12 DIAGNOSIS — R079 Chest pain, unspecified: Secondary | ICD-10-CM | POA: Insufficient documentation

## 2015-11-12 DIAGNOSIS — M546 Pain in thoracic spine: Secondary | ICD-10-CM

## 2015-11-12 DIAGNOSIS — I2510 Atherosclerotic heart disease of native coronary artery without angina pectoris: Secondary | ICD-10-CM | POA: Diagnosis not present

## 2015-11-12 DIAGNOSIS — K219 Gastro-esophageal reflux disease without esophagitis: Secondary | ICD-10-CM | POA: Insufficient documentation

## 2015-11-12 DIAGNOSIS — M47896 Other spondylosis, lumbar region: Secondary | ICD-10-CM | POA: Diagnosis not present

## 2015-11-12 DIAGNOSIS — R51 Headache: Secondary | ICD-10-CM | POA: Diagnosis not present

## 2015-11-12 DIAGNOSIS — E785 Hyperlipidemia, unspecified: Secondary | ICD-10-CM | POA: Diagnosis not present

## 2015-11-12 DIAGNOSIS — Z79899 Other long term (current) drug therapy: Secondary | ICD-10-CM

## 2015-11-12 DIAGNOSIS — Z1401 Asymptomatic hemophilia A carrier: Secondary | ICD-10-CM | POA: Diagnosis not present

## 2015-11-12 MED ORDER — OXYMORPHONE HCL ER 30 MG PO TB12: 30.0000 mg | ORAL_TABLET | Freq: Two times a day (BID) | ORAL | 0 refills | Status: DC

## 2015-11-12 NOTE — Progress Notes (Signed)
Subjective:    Patient ID: [name], female    DOB: 11/26/1950, 65 y.o.   MRN: [id number]  HPI: [name] is a 65 year old female who returns for follow up for chronic pain and medication refill. She states her pain is located in her mid- lower back,bilateral hips,lower extremities, left knee and bilateral feet. She rates her pain 7. Her current exercise regime is chair exercises and walking short distances. Also states she fell two weeks ago she was walking in her kitchen taking out trash when her foot became lodged in the doorway, she landed on her left side. She went to the ED at [facility] and followed up with the Podiatrist. Wearing open orthotic left shoe.   Pain Inventory Average Pain 8 Pain Right Now 7 My pain is sharp, burning, dull, stabbing, tingling and aching  In the last 24 hours, has pain interfered with the following? General activity 8 Relation with others 9 Enjoyment of life 9 What TIME of day is your pain at its worst? evening and night Sleep (in general) Fair  Pain is worse with: walking, bending, inactivity, standing and some activites Pain improves with: heat/ice Relief from Meds: 2  Mobility use a walker how many minutes can you walk? 5 ability to climb steps?  no do you drive?  no Do you have any goals in this area?  no  Function disabled: date disabled . I need assistance with the following:  feeding, dressing, bathing, household duties and shopping Do you have any goals in this area?  no  Neuro/Psych bladder control problems bowel control problems weakness numbness tingling trouble walking dizziness depression anxiety  Prior Studies Any changes since last visit?  yes  Physicians involved in your care Any changes since last visit?  yes   Family History  Problem Relation Age of Onset  . Heart attack Mother   . Colon cancer Mother   . Heart attack Father   . Stroke Sister   . Hemophilia Child   . Cancer    .  Coronary artery disease    . Cancer Brother   . Cancer Maternal Aunt   . Cancer Maternal Uncle   . Cancer Paternal Aunt   . Cancer Paternal Uncle   . Cancer Brother    Social History   Social History  . Marital status: Legally Separated    Spouse name: N/A  . Number of children: 4  . Years of education: N/A   Occupational History  . Disabled    Social History Main Topics  . Smoking status: Never Smoker  . Smokeless tobacco: Never Used  . Alcohol use No  . Drug use: No  . Sexual activity: Not Asked   Other Topics Concern  . None   Social History Narrative   Patient does not get regular exercise   Denies caffeine use    Past Surgical History:  Procedure Laterality Date  . ABDOMINAL HYSTERECTOMY    . Arm Surgery Bilateral    due to fall-pt broke both forearms  . BIOPSY N/A 07/01/2013   Procedure: BIOPSY;  Surgeon: Rogene Houston, MD;  Location: [facility];  Service: Endoscopy;  Laterality: N/A;  . CHOLECYSTECTOMY    . COLONOSCOPY  08/06/2011   Procedure: COLONOSCOPY;  Surgeon: Rogene Houston, MD;  Location: [facility];  Service: Endoscopy;  Laterality: N/A;  215  . COLONOSCOPY WITH PROPOFOL N/A 12/31/2012   Procedure: COLONOSCOPY WITH PROPOFOL;  Surgeon: Mechele Dawley  Laural Golden, MD;  Location: [facility];  Service: Endoscopy;  Laterality: N/A;  in cecum at 0814, total withdrawal time 68min  . CORONARY ANGIOPLASTY WITH STENT PLACEMENT    . ESOPHAGEAL DILATION N/A 06/07/2015   Procedure: ESOPHAGEAL DILATION;  Surgeon: Rogene Houston, MD;  [facility];  Service: Endoscopy;  Laterality: N/A;  . ESOPHAGOGASTRODUODENOSCOPY N/A 06/07/2015   Procedure: ESOPHAGOGASTRODUODENOSCOPY (EGD);  Surgeon: Rogene Houston, MD;  [facility];  Service: Endoscopy;  Laterality: N/A;  2:45 - moved to 1:00 - Ann to notify pt  . ESOPHAGOGASTRODUODENOSCOPY (EGD) WITH PROPOFOL N/A 12/31/2012   Procedure: ESOPHAGOGASTRODUODENOSCOPY (EGD) WITH PROPOFOL;  Surgeon: Rogene Houston, MD;   Location: [facility];  Service: Endoscopy;  Laterality: N/A;  GE junction 37,  . ESOPHAGOGASTRODUODENOSCOPY (EGD) WITH PROPOFOL N/A 07/01/2013   Procedure: ESOPHAGOGASTRODUODENOSCOPY (EGD) WITH PROPOFOL;  Surgeon: Rogene Houston, MD;  Location: [facility];  Service: Endoscopy;  Laterality: N/A;  950  . MALONEY DILATION N/A 12/31/2012   Procedure: MALONEY DILATION;  Surgeon: Rogene Houston, MD;  Location: [facility];  Service: Endoscopy;  Laterality: N/A;  used a # 54,#56  . MALONEY DILATION N/A 07/01/2013   Procedure: Venia Minks DILATION;  Surgeon: Rogene Houston, MD;  Location: [facility];  Service: Endoscopy;  Laterality: N/A;  54/58; no heme present   Past Medical History:  Diagnosis Date  . Anxiety   . Anxiety and depression   . Arthritis   . CAD (coronary artery disease)    Stent placement circumflex coronary 2007, catheterization 2008 patent stents. Normal LV function  . Chest pain   . CHF (congestive heart failure) ([place])   . COPD (chronic obstructive pulmonary disease) ([place])   . Depression   . Diabetes mellitus    Insulin dependent  . Diabetic polyneuropathy (HCC)     severe on multiple medications  . Dyslipidemia   . GERD (gastroesophageal reflux disease)   . Headache(784.0)   . 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 MI (myocardial infarction) ([place])    2007  . Obstructive sleep apnea    CPAP machine at night  . Palpitations   . Tachycardia    There were no vitals taken for this visit.  Opioid Risk Score:   Fall Risk Score:  `1  Depression screen PHQ 2/9  Depression screen [facility] 2/9 10/11/2015 07/12/2015 07/12/2015 05/14/2015 05/14/2015 05/02/2015 12/11/2014  Decreased Interest 0 0 0 0 0 0 3  Down, Depressed, Hopeless 0 0 0 0 0 0 3  PHQ - 2 Score 0 0 0 0 0 0 6  Altered sleeping - - - - - - -  Tired, decreased energy - - - - - - -  Change in appetite - - - - - - -  Feeling bad or failure about yourself  - - - - - - -  Trouble concentrating - - - - - - -  Moving slowly or fidgety/restless - - - - - - -   Suicidal thoughts - - - - - - -  PHQ-9 Score - - - - - - -  Some recent data might be hidden    Review of Systems  HENT: Negative.   Eyes: Negative.   Respiratory: Negative.   Cardiovascular: Negative.   Endocrine: Negative.   Genitourinary: Negative.   Musculoskeletal: Positive for back pain, gait problem and neck pain.  Skin: Negative.   Allergic/Immunologic: Negative.   Neurological: Positive for dizziness, weakness and numbness.  Hematological: Negative.  Psychiatric/Behavioral: Positive for dysphoric mood. The patient is nervous/anxious.        Objective:   Physical Exam  Constitutional: She is oriented to person, place, and time. She appears well-developed and well-nourished.  HENT:  Head: Normocephalic and atraumatic.  Neck: Normal range of motion. Neck supple.  Cardiovascular: Normal rate and regular rhythm.   Pulmonary/Chest: Effort normal and breath sounds normal.  Musculoskeletal:  Normal Muscle Bulk and Muscle Testing Reveals: Upper Extremities: Full ROM and Muscle Strength 5/5 Thoracic and Lumbar Hypersensitivity Lower Extremities: Full ROM and Muscle Strength 5/5 Left Lower Extremity Flexion Produces Pain into Left Patella Arises from chair slowly using walker for support Narrow Based Gait  Neurological: She is alert and oriented to person, place, and time.  Skin: Skin is warm and dry.  Psychiatric: She has a normal mood and affect.  Nursing note and vitals reviewed.         Assessment & Plan:  1.Lumbar pain lumbar spondylosis: Encouraged to increase Activity as tolerated and continue HEP. Refilled: Opana 30 mg one tablet every 12 hours as needed #60 and Tramadol 50 mg one tablet twice a day #60. We will continue the opioid monitoring program, this consists of regular clinic visits, examinations, urine drug screen, pill counts as well as use of [place] Controlled Substance Reporting System. 2.. Bilateral Knee Pain/ Degenerative: Continue  Voltaren Gel. Continue to Monitor 3. Severe diabetic poly neuropathy: Continue Gabapentin. 4. Insomnia: On Trazodone PCP Following   20 minutes of face to face patient care time was spent during this visit. All questions were encouraged and answered.

## 2015-11-15 ENCOUNTER — Telehealth: Payer: Self-pay | Admitting: *Deleted

## 2015-11-15 NOTE — Telephone Encounter (Signed)
Prior authorization submitted to [facility] for patient Opana 30 mg BID

## 2015-11-27 ENCOUNTER — Encounter: Payer: Self-pay | Admitting: Sports Medicine

## 2015-11-27 ENCOUNTER — Ambulatory Visit (INDEPENDENT_AMBULATORY_CARE_PROVIDER_SITE_OTHER): Payer: Medicaid Other | Admitting: Sports Medicine

## 2015-11-27 ENCOUNTER — Other Ambulatory Visit: Payer: Self-pay | Admitting: Physical Medicine & Rehabilitation

## 2015-11-27 VITALS — BP 145/67 | HR 72 | Ht 62.0 in | Wt 235.0 lb

## 2015-11-27 DIAGNOSIS — T148 Other injury of unspecified body region: Secondary | ICD-10-CM

## 2015-11-27 DIAGNOSIS — L97501 Non-pressure chronic ulcer of other part of unspecified foot limited to breakdown of skin: Secondary | ICD-10-CM | POA: Diagnosis not present

## 2015-11-27 DIAGNOSIS — S93602D Unspecified sprain of left foot, subsequent encounter: Secondary | ICD-10-CM

## 2015-11-27 DIAGNOSIS — M79672 Pain in left foot: Secondary | ICD-10-CM

## 2015-11-27 DIAGNOSIS — IMO0002 Reserved for concepts with insufficient information to code with codable children: Secondary | ICD-10-CM

## 2015-11-27 DIAGNOSIS — E1142 Type 2 diabetes mellitus with diabetic polyneuropathy: Secondary | ICD-10-CM

## 2015-11-27 NOTE — Progress Notes (Signed)
Subjective: [name] is a 65 y.o. Diabetic female patient seen in office for evaluation of Left hallux blistered skin after falling down as she was taking out trash. Reports has been dressing using topical antibiotic cream with some improvement however pain to ball of foot. Completed antibiotics. Denies nausea/fever/vomiting/chills/night sweats/shortness of breath. Patient has no other pedal complaints at this time.  FBS good per patient  Patient Active Problem List   Diagnosis Date Noted  . Essential hypertension, benign 05/14/2015  . Morbid obesity due to excess calories ([place]) 05/02/2015  . Dysphagia 04/24/2015  . Arthrosis of right acromioclavicular joint 11/13/2014  . Spondylosis of lumbar region without myelopathy or radiculopathy 11/13/2014  . Painful diabetic neuropathy ([place]) 06/26/2014  . Diarrhea 03/02/2013  . Rectal irritation 03/02/2013  . Lumbago 07/29/2012  . Muscle weakness (generalized) 07/29/2012  . Spinal stenosis, lumbar region, without neurogenic claudication 07/19/2012  . Irritable bowel syndrome 08/24/2011  . Pancreatitis 10/17/2010  . CAD (coronary artery disease)   . Type 2 diabetes mellitus with vascular disease ([place])   . Diabetic polyneuropathy ([place])   . Anxiety and depression   . Hyperlipidemia 09/20/2009  . Chronic diastolic heart failure ([place]) 09/20/2009  . OBSTRUCTIVE SLEEP APNEA 09/04/2009  . TACHYCARDIA 11/08/2008  . PALPITATIONS 11/08/2008  . CHEST PAIN-UNSPECIFIED 11/08/2008   Current Outpatient Prescriptions on File Prior to Visit  Medication Sig Dispense Refill  . ALPRAZolam (XANAX) 1 MG tablet Take 1 tablet (1 mg total) by mouth 4 (four) times daily. 30 tablet 3  . aspirin EC 81 MG tablet Take 81 mg by mouth daily.    Marland Kitchen atorvastatin (LIPITOR) 40 MG tablet Take 40 mg by mouth daily.    . beclomethasone (QVAR) 40 MCG/ACT inhaler Inhale 1 puff into the lungs 2 (two) times daily.    Marland Kitchen dicyclomine (BENTYL) 10 MG capsule TAKE 1 CAPSULE BY MOUTH  TWICE DAILY AS NEEDED FOR SPASMS. 60 capsule 5  . estrogens, conjugated, (PREMARIN) 1.25 MG tablet Take 1.25 mg by mouth daily.     . fluconazole (DIFLUCAN) 150 MG tablet Take 1 tablet (150 mg total) by mouth once. 1 tablet 0  . fluticasone-salmeterol (ADVAIR HFA) 115-21 MCG/ACT inhaler Inhale 2 puffs into the lungs 2 (two) times daily.    Marland Kitchen gabapentin (NEURONTIN) 100 MG capsule Take 1 capsule (100 mg total) by mouth 3 (three) times daily. 90 capsule 6  . glucose blood (ACCU-CHEK AVIVA) test strip Use to test 4 times a day 150 each 3  . ibuprofen (ADVIL,MOTRIN) 200 MG tablet Take 200 mg by mouth daily as needed for headache.    . Insulin Glargine (LANTUS SOLOSTAR [place]) Inject 40 Units into the skin at bedtime.    . Insulin Pen Needle (B-D ULTRAFINE III SHORT PEN) 31G X 8 MM MISC 1 each by Does not apply route as directed. 100 each 3  . INVOKANA 100 MG TABS tablet TAKE 1 TABLET BY MOUTH DAILY BEFORE BREAKFAST 30 tablet 2  . lidocaine (XYLOCAINE) 5 % ointment Apply 1 application topically at bedtime.     . metFORMIN (GLUCOPHAGE) 500 MG tablet TAKE ONE TABLET BY MOUTH TWICE DAILY WITH A MEAL 60 tablet 2  . nitroGLYCERIN (NITROSTAT) 0.4 MG SL tablet Place 1 tablet (0.4 mg total) under the tongue every 5 (five) minutes x 3 doses as needed. For chest pains 25 tablet 3  . ondansetron (ZOFRAN) 4 MG tablet Take 4 mg by mouth every 6 (six) hours as needed for nausea or vomiting.    Marland Kitchen  oxymorphone (OPANA ER) 30 MG 12 hr tablet Take 1 tablet (30 mg total) by mouth every 12 (twelve) hours. 60 tablet 0  . pantoprazole (PROTONIX) 40 MG tablet TAKE 1 TABLET BY MOUTH TWICE DAILY BEFORE MEALS 60 tablet 5  . potassium chloride (K-DUR,KLOR-CON) 10 MEQ tablet TAKE 2 TABLETS BY MOUTH 2 TIMES DAILY. 120 tablet 6  . psyllium (METAMUCIL SMOOTH TEXTURE) 28 % packet Take 1 packet by mouth at bedtime.    . ranitidine (ZANTAC) 300 MG tablet Take 300 mg by mouth 2 (two) times daily.    . TOPROL XL 50 MG 24 hr tablet TAKE 1 TABLET  BY MOUTH DAILY WITH OR IMMEDIATELY FOLLOWING A MEAL. 30 tablet 6  . traMADol (ULTRAM) 50 MG tablet Take 1 tablet (50 mg total) by mouth 2 (two) times daily. 60 tablet 3  . traZODone (DESYREL) 25 mg TABS tablet Take 0.5 tablets (25 mg total) by mouth at bedtime. (Patient taking differently: Take 50 mg by mouth at bedtime. ) 30 tablet 2  . [DISCONTINUED] diltiazem (TIAZAC) 180 MG 24 hr capsule Take 1 capsule (180 mg total) by mouth daily. 30 capsule 1  . [DISCONTINUED] potassium chloride (K-DUR) 10 MEQ tablet Take 2 tablets (20 mEq total) by mouth 2 (two) times daily.     No current facility-administered medications on file prior to visit.    Allergies  Allergen Reactions  . Nitrofuran Derivatives Itching and Swelling  . Amphetamine-Dextroamphet Er Swelling  . Pregabalin Swelling  . Topiramate Other (See Comments)    Tongue tingle  . Verelan [Verapamil] Rash    Recent Results (from the past 2160 hour(s))  ToxAssure Select Plus     Status: None   Collection Time: 09/10/15  1:10 AM  Result Value Ref Range   ToxASSURE Select (Plus) FINAL     Comment: ==================================================================== TOXASSURE SELECT,+ANTIDEPR,UR ==================================================================== Test                             Result       Flag       Units Drug Present and Declared for Prescription Verification   Oxymorphone                    4237         EXPECTED   ng/mg creat    Sources of oxymorphone include scheduled prescription    medications; it is also an expected metabolite of oxycodone.   Tramadol                       PRESENT      EXPECTED   O-Desmethyltramadol            PRESENT      EXPECTED   N-Desmethyltramadol            PRESENT      EXPECTED    Source of tramadol is a prescription medication.    O-desmethyltramadol and N-desmethyltramadol are expected    metabolites of tramadol. Drug Present not Declared for Prescription Verification    Alprazolam                     210          UNEXPECTED ng/mg creat   Alpha-hydroxyalprazolam        329          UNEXPECTED ng/mg creat    Sour ce of alprazolam is  a scheduled prescription medication.    Alpha-hydroxyalprazolam is an expected metabolite of alprazolam.   Bupropion                      PRESENT      UNEXPECTED   Citalopram                     PRESENT      UNEXPECTED   Desmethylcitalopram            PRESENT      UNEXPECTED    Desmethylcitalopram is an expected metabolite of citalopram or    the enantiomeric form, escitalopram.   Trazodone                      PRESENT      UNEXPECTED   1,3 chlorophenyl piperazine    PRESENT      UNEXPECTED    1,3-chlorophenyl piperazine is an expected metabolite of    trazodone. ==================================================================== Test                      Result    Flag   Units      Ref Range   Creatinine              41               mg/dL      >=20 ==================================================================== Declared Medications:  The flagging and interpretation on this report are based on the  following declared medications.  Unexpected  results may arise from  inaccuracies in the declared medications.  **Note: The testing scope of this panel includes these medications:  Oxymorphone (Opana)  Tramadol ==================================================================== For clinical consultation, please call [phone]. ====================================================================    PDF .   Comprehensive metabolic panel     Status: Abnormal   Collection Time: 10/04/15 12:56 PM  Result Value Ref Range   Glucose 156 (H) 65 - 99 mg/dL   BUN 11 8 - 27 mg/dL   Creatinine, Ser 0.71 0.57 - 1.00 mg/dL   GFR calc non Af Amer 92 >59 mL/min/1.73   GFR calc Af Amer 106 >59 mL/min/1.73   BUN/Creatinine Ratio 15 12 - 28   Sodium 142 134 - 144 mmol/L   Potassium 4.5 3.5 - 5.2 mmol/L   Chloride 101 96 - 106  mmol/L   CO2 26 18 - 29 mmol/L   Calcium 9.4 8.7 - 10.3 mg/dL   Total Protein 6.1 6.0 - 8.5 g/dL   Albumin 3.5 (L) 3.6 - 4.8 g/dL   Globulin, Total 2.6 1.5 - 4.5 g/dL   Albumin/Globulin Ratio 1.3 1.2 - 2.2   Bilirubin Total 0.5 0.0 - 1.2 mg/dL   Alkaline Phosphatase 92 39 - 117 IU/L   AST 10 0 - 40 IU/L   ALT 7 0 - 32 IU/L  Hgb A1c w/o eAG     Status: Abnormal   Collection Time: 10/04/15 12:56 PM  Result Value Ref Range   Hgb A1c MFr Bld 7.8 (H) 4.8 - 5.6 %    Comment:          Pre-diabetes: 5.7 - 6.4          Diabetes: >6.4          Glycemic control for adults with diabetes: <7.0     Objective: There were no vitals filed for this visit.  General: Patient is awake, alert, oriented x 3 and  in no acute distress.  Dermatology: Skin is warm and dry bilateral with healing dry blister with superifical laceration and friable skin sub met 1 and plantar hallux on left, improving in nature. There is no malodor, no active drainage, no erythema, no edema. No acute signs of infection.   Vascular: Dorsalis Pedis pulse = 1/4 Bilateral,  Posterior Tibial pulse = 1/4 Bilateral,  Capillary Fill Time < 5 seconds  Neurologic: Protective sensation diminished bilateral using  the 5.07/10g Semmes Weinstein Monofilament.  Musculosketal: There is decreased ankle joint range of motion Bilateral. There is decreased Subtalar joint range of motion Bilateral. There is a decrease in 1st metatarsophalangeal joint range of motion Bilateral with tenderness to Left 1st MTPJ. Minimal Pain with palpation to blistered/lacerated area on left plantar foot. No pain with compression to calves bilateral. No symptomatic gross bony deformities noted bilateral.  Assessment and Plan:  Problem List Items Addressed This Visit    None    Visit Diagnoses    Blister without infection    -  Primary   Sprain of foot, left, subsequent encounter       Foot pain, left         -Examined patient and discussed continued care of  the blister with possible sprain secondary to trip and fall and treatment alternatives. - Using a tissue nipper removed free hanging blistered skin -Applied topical antibiotic cream and dry sterile dressing and instructed patient to continue with daily dressings at home consisting of the same - Advised patient to go to the ER or return to office if the blister worsens or if constitutional symptoms are present. -Cont with pain meds from pain management until completed  -Topical voltaren of which she owns as needed for pain plantar met head -Continue with post op shoe -Patient to return to office in 3 weeks for follow up care and evaluation or sooner if problems arise.  Landis Martins, DPM

## 2015-12-07 ENCOUNTER — Encounter: Payer: Medicaid Other | Attending: Physical Medicine & Rehabilitation | Admitting: Registered Nurse

## 2015-12-07 ENCOUNTER — Encounter: Payer: Self-pay | Admitting: Registered Nurse

## 2015-12-07 VITALS — BP 123/78 | HR 111

## 2015-12-07 DIAGNOSIS — R002 Palpitations: Secondary | ICD-10-CM | POA: Insufficient documentation

## 2015-12-07 DIAGNOSIS — Z5181 Encounter for therapeutic drug level monitoring: Secondary | ICD-10-CM

## 2015-12-07 DIAGNOSIS — M47816 Spondylosis without myelopathy or radiculopathy, lumbar region: Secondary | ICD-10-CM | POA: Diagnosis not present

## 2015-12-07 DIAGNOSIS — K219 Gastro-esophageal reflux disease without esophagitis: Secondary | ICD-10-CM | POA: Diagnosis not present

## 2015-12-07 DIAGNOSIS — M47896 Other spondylosis, lumbar region: Secondary | ICD-10-CM | POA: Insufficient documentation

## 2015-12-07 DIAGNOSIS — I252 Old myocardial infarction: Secondary | ICD-10-CM | POA: Insufficient documentation

## 2015-12-07 DIAGNOSIS — E1142 Type 2 diabetes mellitus with diabetic polyneuropathy: Secondary | ICD-10-CM | POA: Diagnosis not present

## 2015-12-07 DIAGNOSIS — Z760 Encounter for issue of repeat prescription: Secondary | ICD-10-CM | POA: Diagnosis not present

## 2015-12-07 DIAGNOSIS — M546 Pain in thoracic spine: Secondary | ICD-10-CM | POA: Diagnosis not present

## 2015-12-07 DIAGNOSIS — F418 Other specified anxiety disorders: Secondary | ICD-10-CM | POA: Insufficient documentation

## 2015-12-07 DIAGNOSIS — G8929 Other chronic pain: Secondary | ICD-10-CM | POA: Insufficient documentation

## 2015-12-07 DIAGNOSIS — M1990 Unspecified osteoarthritis, unspecified site: Secondary | ICD-10-CM | POA: Insufficient documentation

## 2015-12-07 DIAGNOSIS — M79605 Pain in left leg: Secondary | ICD-10-CM | POA: Insufficient documentation

## 2015-12-07 DIAGNOSIS — R51 Headache: Secondary | ICD-10-CM | POA: Diagnosis not present

## 2015-12-07 DIAGNOSIS — Z79899 Other long term (current) drug therapy: Secondary | ICD-10-CM

## 2015-12-07 DIAGNOSIS — G894 Chronic pain syndrome: Secondary | ICD-10-CM

## 2015-12-07 DIAGNOSIS — R000 Tachycardia, unspecified: Secondary | ICD-10-CM | POA: Insufficient documentation

## 2015-12-07 DIAGNOSIS — M5489 Other dorsalgia: Secondary | ICD-10-CM | POA: Insufficient documentation

## 2015-12-07 DIAGNOSIS — Z1401 Asymptomatic hemophilia A carrier: Secondary | ICD-10-CM | POA: Insufficient documentation

## 2015-12-07 DIAGNOSIS — M25561 Pain in right knee: Secondary | ICD-10-CM

## 2015-12-07 DIAGNOSIS — I509 Heart failure, unspecified: Secondary | ICD-10-CM | POA: Diagnosis not present

## 2015-12-07 DIAGNOSIS — R079 Chest pain, unspecified: Secondary | ICD-10-CM | POA: Insufficient documentation

## 2015-12-07 DIAGNOSIS — I2510 Atherosclerotic heart disease of native coronary artery without angina pectoris: Secondary | ICD-10-CM | POA: Diagnosis not present

## 2015-12-07 DIAGNOSIS — M79604 Pain in right leg: Secondary | ICD-10-CM | POA: Insufficient documentation

## 2015-12-07 DIAGNOSIS — E1140 Type 2 diabetes mellitus with diabetic neuropathy, unspecified: Secondary | ICD-10-CM

## 2015-12-07 DIAGNOSIS — G4700 Insomnia, unspecified: Secondary | ICD-10-CM | POA: Insufficient documentation

## 2015-12-07 DIAGNOSIS — J449 Chronic obstructive pulmonary disease, unspecified: Secondary | ICD-10-CM | POA: Insufficient documentation

## 2015-12-07 DIAGNOSIS — Z794 Long term (current) use of insulin: Secondary | ICD-10-CM | POA: Insufficient documentation

## 2015-12-07 DIAGNOSIS — G4733 Obstructive sleep apnea (adult) (pediatric): Secondary | ICD-10-CM | POA: Diagnosis not present

## 2015-12-07 DIAGNOSIS — M25562 Pain in left knee: Secondary | ICD-10-CM

## 2015-12-07 DIAGNOSIS — E785 Hyperlipidemia, unspecified: Secondary | ICD-10-CM | POA: Insufficient documentation

## 2015-12-07 MED ORDER — TRAMADOL HCL 50 MG PO TABS: 50.0000 mg | ORAL_TABLET | Freq: Two times a day (BID) | ORAL | 3 refills | Status: DC

## 2015-12-07 MED ORDER — OXYMORPHONE HCL ER 30 MG PO TB12: 30.0000 mg | ORAL_TABLET | Freq: Two times a day (BID) | ORAL | 0 refills | Status: DC

## 2015-12-07 NOTE — Progress Notes (Signed)
Subjective:    Patient ID: [name], female    DOB: 1950-05-27, 65 y.o.   MRN: [id number]  HPI:   [name] is a 65 year old female who returns for follow up for chronic pain and medication refill. She states her pain is located in her bilateral shoulders, mid-lower back,lower extremities, bilateral knees and bilateral feet. She did not rate her pain today. Her current exercise regime is chair exercises and walking short distances. [name] picked up her Opana on 11/14/2015 per [place] she brought medication in old pill bottle. She  accidently stepped on the current medication bottle she sates.  Pain Inventory Average Pain 8 Pain Right Now n/a My pain is sharp, burning, dull, stabbing, tingling and aching  In the last 24 hours, has pain interfered with the following? General activity 8 Relation with others 9 Enjoyment of life 8 What TIME of day is your pain at its worst? morning, night Sleep (in general) Fair  Pain is worse with: walking, bending, inactivity and standing Pain improves with: medication Relief from Meds: a little  Mobility use a walker how many minutes can you walk? 5 ability to climb steps?  no do you drive?  no Do you have any goals in this area?  no  Function I need assistance with the following:  dressing, meal prep, household duties and shopping  Neuro/Psych bladder control problems bowel control problems weakness numbness tingling trouble walking spasms confusion depression anxiety loss of taste or smell  Prior Studies Any changes since last visit?  no  Physicians involved in your care Any changes since last visit?  no   Family History  Problem Relation Age of Onset  . Heart attack Mother   . Colon cancer Mother   . Heart attack Father   . Stroke Sister   . Hemophilia Child   . Cancer    . Coronary artery disease    . Cancer Brother   . Cancer Maternal Aunt   . Cancer Maternal Uncle   . Cancer Paternal Aunt   .  Cancer Paternal Uncle   . Cancer Brother    Social History   Social History  . Marital status: Legally Separated    Spouse name: N/A  . Number of children: 4  . Years of education: N/A   Occupational History  . Disabled    Social History Main Topics  . Smoking status: Never Smoker  . Smokeless tobacco: Never Used  . Alcohol use No  . Drug use: No  . Sexual activity: Not on file   Other Topics Concern  . Not on file   Social History Narrative   Patient does not get regular exercise   Denies caffeine use    Past Surgical History:  Procedure Laterality Date  . ABDOMINAL HYSTERECTOMY    . Arm Surgery Bilateral    due to fall-pt broke both forearms  . BIOPSY N/A 07/01/2013   Procedure: BIOPSY;  Surgeon: Rogene Houston, MD;  Location: [facility];  Service: Endoscopy;  Laterality: N/A;  . CHOLECYSTECTOMY    . COLONOSCOPY  08/06/2011   Procedure: COLONOSCOPY;  Surgeon: Rogene Houston, MD;  [facility];  Service: Endoscopy;  Laterality: N/A;  215  . COLONOSCOPY WITH PROPOFOL N/A 12/31/2012   Procedure: COLONOSCOPY WITH PROPOFOL;  Surgeon: Rogene Houston, MD;  Location: [facility];  Service: Endoscopy;  Laterality: N/A;  in cecum at 0814, total withdrawal time 25min  . CORONARY  ANGIOPLASTY WITH STENT PLACEMENT    . ESOPHAGEAL DILATION N/A 06/07/2015   Procedure: ESOPHAGEAL DILATION;  Surgeon: Rogene Houston, MD;  [facility];  Service: Endoscopy;  Laterality: N/A;  . ESOPHAGOGASTRODUODENOSCOPY N/A 06/07/2015   Procedure: ESOPHAGOGASTRODUODENOSCOPY (EGD);  Surgeon: Rogene Houston, MD;  [facility];  Service: Endoscopy;  Laterality: N/A;  2:45 - moved to 1:00 - Ann to notify pt  . ESOPHAGOGASTRODUODENOSCOPY (EGD) WITH PROPOFOL N/A 12/31/2012   Procedure: ESOPHAGOGASTRODUODENOSCOPY (EGD) WITH PROPOFOL;  Surgeon: Rogene Houston, MD;  [facility];  Service: Endoscopy;  Laterality: N/A;  GE junction 37,  . ESOPHAGOGASTRODUODENOSCOPY (EGD) WITH  PROPOFOL N/A 07/01/2013   Procedure: ESOPHAGOGASTRODUODENOSCOPY (EGD) WITH PROPOFOL;  Surgeon: Rogene Houston, MD;  [facility]: [facility];  Service: Endoscopy;  Laterality: N/A;  950  . MALONEY DILATION N/A 12/31/2012   Procedure: MALONEY DILATION;  Surgeon: Rogene Houston, MD;  Location: [facility];  Service: Endoscopy;  Laterality: N/A;  used a # 54,#56  . MALONEY DILATION N/A 07/01/2013   Procedure: Venia Minks DILATION;  Surgeon: Rogene Houston, MD;  Location: [facility];  Service: Endoscopy;  Laterality: N/A;  54/58; no heme present   Past Medical History:  Diagnosis Date  . Anxiety   . Anxiety and depression   . Arthritis   . CAD (coronary artery disease)    Stent placement circumflex coronary 2007, catheterization 2008 patent stents. Normal LV function  . Chest pain   . CHF (congestive heart failure) ([place])   . COPD (chronic obstructive pulmonary disease) ([place])   . Depression   . Diabetes mellitus    Insulin dependent  . Diabetic polyneuropathy (HCC)     severe on multiple medications  . Dyslipidemia   . GERD (gastroesophageal reflux disease)   . Headache(784.0)   . 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 MI (myocardial infarction) ([place])    2007  . Obstructive sleep apnea    CPAP machine at night  . Palpitations   . Tachycardia    There were no vitals taken for this visit.  Opioid Risk Score:   Fall Risk Score:  `1  Depression screen PHQ 2/9  Depression screen [facility] 2/9 10/11/2015 07/12/2015 07/12/2015 05/14/2015 05/14/2015 05/02/2015 12/11/2014  Decreased Interest 0 0 0 0 0 0 3  Down, Depressed, Hopeless 0 0 0 0 0 0 3  PHQ - 2 Score 0 0 0 0 0 0 6  Altered sleeping - - - - - - -  Tired, decreased energy - - - - - - -  Change in appetite - - - - - - -  Feeling bad or failure about yourself  - - - - - - -  Trouble concentrating - - - - - - -  Moving slowly or fidgety/restless - - - - - - -  Suicidal thoughts - - - - - - -  PHQ-9 Score - - - - - - -  Some recent data might be hidden     Review of  Systems  Respiratory: Positive for apnea and shortness of breath.   Gastrointestinal: Positive for abdominal pain, constipation, diarrhea, nausea and vomiting.  Genitourinary: Positive for dysuria.  Musculoskeletal: Positive for gait problem.  Neurological: Positive for weakness and numbness.       Spasams  Psychiatric/Behavioral: Positive for confusion and dysphoric mood. The patient is nervous/anxious.   All other systems reviewed and are negative.      Objective:   Physical Exam  Constitutional: She appears well-developed and well-nourished.  HENT:  Head: Normocephalic and atraumatic.  Neck: Normal range of motion. Neck supple.  Cardiovascular: Normal rate and regular rhythm.   Nursing note and vitals reviewed.         Assessment & Plan:  1.Lumbar pain lumbar spondylosis: Encouraged to increase Activity as tolerated and continue HEP. Refilled: Opana 30 mg one tablet every 12 hours as needed #60 and Tramadol 50 mg one tablet twice a day #60. We will continue the opioid monitoring program, this consists of regular clinic visits, examinations, urine drug screen, pill counts as well as use of [place] Controlled Substance Reporting System. 2.. Bilateral Knee Pain/ Degenerative: Continue Voltaren Gel. Continue to Monitor 3. Severe diabetic poly neuropathy: Continue Gabapentin. 4. Insomnia: On Trazodone PCP Following   20 minutes of face to face patient care time was spent during this visit. All questions were encouraged and answered.

## 2015-12-18 ENCOUNTER — Ambulatory Visit: Payer: Medicaid Other | Admitting: Sports Medicine

## 2015-12-25 ENCOUNTER — Other Ambulatory Visit: Payer: Self-pay | Admitting: "Endocrinology

## 2016-01-07 ENCOUNTER — Ambulatory Visit: Payer: Medicaid Other | Admitting: Physical Medicine & Rehabilitation

## 2016-01-08 ENCOUNTER — Ambulatory Visit (HOSPITAL_BASED_OUTPATIENT_CLINIC_OR_DEPARTMENT_OTHER): Payer: Medicaid Other | Admitting: Physical Medicine & Rehabilitation

## 2016-01-08 ENCOUNTER — Other Ambulatory Visit: Payer: Self-pay | Admitting: "Endocrinology

## 2016-01-08 ENCOUNTER — Encounter: Payer: Medicaid Other | Attending: Physical Medicine & Rehabilitation

## 2016-01-08 ENCOUNTER — Encounter: Payer: Self-pay | Admitting: Physical Medicine & Rehabilitation

## 2016-01-08 VITALS — BP 137/77 | HR 80 | Resp 14

## 2016-01-08 DIAGNOSIS — Z5181 Encounter for therapeutic drug level monitoring: Secondary | ICD-10-CM

## 2016-01-08 DIAGNOSIS — G4700 Insomnia, unspecified: Secondary | ICD-10-CM | POA: Insufficient documentation

## 2016-01-08 DIAGNOSIS — Z1401 Asymptomatic hemophilia A carrier: Secondary | ICD-10-CM | POA: Diagnosis not present

## 2016-01-08 DIAGNOSIS — M47816 Spondylosis without myelopathy or radiculopathy, lumbar region: Secondary | ICD-10-CM

## 2016-01-08 DIAGNOSIS — R000 Tachycardia, unspecified: Secondary | ICD-10-CM | POA: Diagnosis not present

## 2016-01-08 DIAGNOSIS — Z794 Long term (current) use of insulin: Secondary | ICD-10-CM | POA: Insufficient documentation

## 2016-01-08 DIAGNOSIS — Z79899 Other long term (current) drug therapy: Secondary | ICD-10-CM

## 2016-01-08 DIAGNOSIS — K219 Gastro-esophageal reflux disease without esophagitis: Secondary | ICD-10-CM | POA: Diagnosis not present

## 2016-01-08 DIAGNOSIS — R51 Headache: Secondary | ICD-10-CM | POA: Insufficient documentation

## 2016-01-08 DIAGNOSIS — M1990 Unspecified osteoarthritis, unspecified site: Secondary | ICD-10-CM | POA: Insufficient documentation

## 2016-01-08 DIAGNOSIS — I252 Old myocardial infarction: Secondary | ICD-10-CM | POA: Diagnosis not present

## 2016-01-08 DIAGNOSIS — M79605 Pain in left leg: Secondary | ICD-10-CM | POA: Diagnosis not present

## 2016-01-08 DIAGNOSIS — M47896 Other spondylosis, lumbar region: Secondary | ICD-10-CM | POA: Diagnosis not present

## 2016-01-08 DIAGNOSIS — E1142 Type 2 diabetes mellitus with diabetic polyneuropathy: Secondary | ICD-10-CM | POA: Diagnosis not present

## 2016-01-08 DIAGNOSIS — M79604 Pain in right leg: Secondary | ICD-10-CM | POA: Insufficient documentation

## 2016-01-08 DIAGNOSIS — F418 Other specified anxiety disorders: Secondary | ICD-10-CM | POA: Insufficient documentation

## 2016-01-08 DIAGNOSIS — J449 Chronic obstructive pulmonary disease, unspecified: Secondary | ICD-10-CM | POA: Diagnosis not present

## 2016-01-08 DIAGNOSIS — I509 Heart failure, unspecified: Secondary | ICD-10-CM | POA: Insufficient documentation

## 2016-01-08 DIAGNOSIS — E785 Hyperlipidemia, unspecified: Secondary | ICD-10-CM | POA: Diagnosis not present

## 2016-01-08 DIAGNOSIS — G4733 Obstructive sleep apnea (adult) (pediatric): Secondary | ICD-10-CM | POA: Insufficient documentation

## 2016-01-08 DIAGNOSIS — M5489 Other dorsalgia: Secondary | ICD-10-CM | POA: Insufficient documentation

## 2016-01-08 DIAGNOSIS — Z760 Encounter for issue of repeat prescription: Secondary | ICD-10-CM | POA: Insufficient documentation

## 2016-01-08 DIAGNOSIS — G894 Chronic pain syndrome: Secondary | ICD-10-CM

## 2016-01-08 DIAGNOSIS — R079 Chest pain, unspecified: Secondary | ICD-10-CM | POA: Insufficient documentation

## 2016-01-08 DIAGNOSIS — G8929 Other chronic pain: Secondary | ICD-10-CM | POA: Insufficient documentation

## 2016-01-08 DIAGNOSIS — I2510 Atherosclerotic heart disease of native coronary artery without angina pectoris: Secondary | ICD-10-CM | POA: Diagnosis not present

## 2016-01-08 DIAGNOSIS — R002 Palpitations: Secondary | ICD-10-CM | POA: Diagnosis not present

## 2016-01-08 MED ORDER — TRAMADOL HCL 50 MG PO TABS: 50.0000 mg | ORAL_TABLET | Freq: Four times a day (QID) | ORAL | 0 refills | Status: DC | PRN

## 2016-01-08 MED ORDER — TAPENTADOL HCL ER 150 MG PO TB12: 150.0000 mg | ORAL_TABLET | Freq: Two times a day (BID) | ORAL | 0 refills | Status: DC

## 2016-01-08 MED ORDER — ALPRAZOLAM 1 MG PO TABS: 1.0000 mg | ORAL_TABLET | Freq: Three times a day (TID) | ORAL | 0 refills | Status: DC

## 2016-01-08 NOTE — Patient Instructions (Signed)
Will start slow wean of Xanax reducing by 1 mg per month Change Opana ER to Nucynta ER Increase tramadol to 4 times per day

## 2016-01-08 NOTE — Progress Notes (Signed)
Subjective:    Patient ID: [name], female    DOB: 16-Feb-1951, 65 y.o.   MRN: [id number]  HPI  Patient complaining of 8 out of 10 pain in both feet, burning, tingling, aching. He has been on Opana ER 30 g twice a day In the past has also been on morphine sulfate, extended release 100 mg twice a day  Currently taking tramadol 50 g twice a day Also has Medicaid LOC primary care has been prescribing Xanax 1 mg 3 times a day Still receiving Pain Inventory Average Pain 8 Pain Right Now 7 My pain is sharp, burning, dull, stabbing, tingling and aching  In the last 24 hours, has pain interfered with the following? General activity 8 Relation with others 7 Enjoyment of life 9 What TIME of day is your pain at its worst? morning evening night Sleep (in general) Fair  Pain is worse with: walking, bending, standing and some activites Pain improves with: rest and heat/ice Relief from Meds: 3  Mobility walk with assistance use a walker ability to climb steps?  no do you drive?  no  Function I need assistance with the following:  dressing, bathing, meal prep, household duties and shopping  Neuro/Psych numbness tingling trouble walking spasms dizziness confusion depression anxiety  Prior Studies Any changes since last visit?  no Comparison: Lumbar MRI 03/03/2012  Findings: Normal lumbar alignment.  Negative for fracture or mass lesion.  Hemangioma L1 vertebral body unchanged.  Conus medullaris normal and terminates at mid L2.  L1-2:  Negative  L2-3:  Negative  L3-4:  Normal disc.  Mild facet degeneration  L4-5:  Mild disc bulging.  Moderate to advanced facet degeneration with bilateral facet joint effusions and facet hypertrophy.  Mild spinal stenosis, unchanged.  Neural foramina are adequately patent  L5-S1:  Mild disc and mild facet degeneration.  Negative for neural impingement.  Mild foraminal narrowing bilaterally.  IMPRESSION: Disc and facet  degeneration at L4-5 with mild spinal stenosis, unchanged  Mild degenerative change L5-S1 with mild foraminal encroachment bilaterally.   Original Report Authenticated By: Carl Best, M.D.  CLINICAL DATA:  Pain right knee. No known injury. Initial evaluation  EXAM: RIGHT KNEE - 1-2 VIEW  COMPARISON:  None.  FINDINGS: Patellofemoral degenerative change. No acute bony joint abnormality identified. No evidence fracture dislocation. No knee joint effusion .  IMPRESSION: Mild patellofemoral degenerative change. No acute abnormality identified .   Electronically Signed   By: [place]   On: 10/16/2014 13:59  EXAM: LEFT KNEE - 1-2 VIEW  COMPARISON:  None.  FINDINGS: There are mild degenerative changes involving the medial, lateral, and patellofemoral compartments. No joint effusion. No acute fracture. No suspicious lytic or blastic lesions are identified.  IMPRESSION: Mild degenerative changes.  No evidence for acute  abnormality.   Electronically Signed   By: Nolon Nations M.D.   On: 10/16/2014 14:01 Physicians involved in your care Any changes since last visit?  no   Family History  Problem Relation Age of Onset  . Heart attack Mother   . Colon cancer Mother   . Heart attack Father   . Stroke Sister   . Hemophilia Child   . Cancer    . Coronary artery disease    . Cancer Brother   . Cancer Maternal Aunt   . Cancer Maternal Uncle   . Cancer Paternal Aunt   . Cancer Paternal Uncle   . Cancer Brother    Social History   Social  History  . Marital status: Legally Separated    Spouse name: N/A  . Number of children: 4  . Years of education: N/A   Occupational History  . Disabled    Social History Main Topics  . Smoking status: Never Smoker  . Smokeless tobacco: Never Used  . Alcohol use No  . Drug use: No  . Sexual activity: Not Asked   Other Topics Concern  . None   Social History Narrative   Patient does not get  regular exercise   Denies caffeine use    Past Surgical History:  Procedure Laterality Date  . ABDOMINAL HYSTERECTOMY    . Arm Surgery Bilateral    due to fall-pt broke both forearms  . BIOPSY N/A 07/01/2013   Procedure: BIOPSY;  Surgeon: Rogene Houston, MD;  [facility];  Service: Endoscopy;  Laterality: N/A;  . CHOLECYSTECTOMY    . COLONOSCOPY  08/06/2011   Procedure: COLONOSCOPY;  Surgeon: Rogene Houston, MD;  [facility];  Service: Endoscopy;  Laterality: N/A;  215  . COLONOSCOPY WITH PROPOFOL N/A 12/31/2012   Procedure: COLONOSCOPY WITH PROPOFOL;  Surgeon: Rogene Houston, MD;  Location: [facility];  Service: Endoscopy;  Laterality: N/A;  in cecum at 0814, total withdrawal time 33min  . CORONARY ANGIOPLASTY WITH STENT PLACEMENT    . ESOPHAGEAL DILATION N/A 06/07/2015   Procedure: ESOPHAGEAL DILATION;  Surgeon: Rogene Houston, MD;  [facility];  Service: Endoscopy;  Laterality: N/A;  . ESOPHAGOGASTRODUODENOSCOPY N/A 06/07/2015   Procedure: ESOPHAGOGASTRODUODENOSCOPY (EGD);  Surgeon: Rogene Houston, MD;  [facility];  Service: Endoscopy;  Laterality: N/A;  2:45 - moved to 1:00 - Ann to notify pt  . ESOPHAGOGASTRODUODENOSCOPY (EGD) WITH PROPOFOL N/A 12/31/2012   Procedure: ESOPHAGOGASTRODUODENOSCOPY (EGD) WITH PROPOFOL;  Surgeon: Rogene Houston, MD;  [facility];  Service: Endoscopy;  Laterality: N/A;  GE junction 37,  . ESOPHAGOGASTRODUODENOSCOPY (EGD) WITH PROPOFOL N/A 07/01/2013   Procedure: ESOPHAGOGASTRODUODENOSCOPY (EGD) WITH PROPOFOL;  Surgeon: Rogene Houston, MD;  [facility];  Service: Endoscopy;  Laterality: N/A;  950  . MALONEY DILATION N/A 12/31/2012   Procedure: MALONEY DILATION;  Surgeon: Rogene Houston, MD;  [facility];  Service: Endoscopy;  Laterality: N/A;  used a # 54,#56  . MALONEY DILATION N/A 07/01/2013   Procedure: Venia Minks DILATION;  Surgeon: Rogene Houston, MD;  Location[facility];  Service: Endoscopy;   Laterality: N/A;  54/58; no heme present   Past Medical History:  Diagnosis Date  . Anxiety   . Anxiety and depression   . Arthritis   . CAD (coronary artery disease)    Stent placement circumflex coronary 2007, catheterization 2008 patent stents. Normal LV function  . Chest pain   . CHF (congestive heart failure) ([place])   . COPD (chronic obstructive pulmonary disease) ([place])   . Depression   . Diabetes mellitus    Insulin dependent  . Diabetic polyneuropathy (HCC)     severe on multiple medications  . Dyslipidemia   . GERD (gastroesophageal reflux disease)   . Headache(784.0)   . 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 MI (myocardial infarction)    2007  . Obstructive sleep apnea    CPAP machine at night  . Palpitations   . Tachycardia    BP 137/77   Pulse 80   Resp 14   SpO2 92%   Opioid Risk Score:   Fall Risk Score:  `1  Depression screen  PHQ 2/9  Depression screen [facility] 2/9 10/11/2015 07/12/2015 07/12/2015 05/14/2015 05/14/2015 05/02/2015 12/11/2014  Decreased Interest 0 0 0 0 0 0 3  Down, Depressed, Hopeless 0 0 0 0 0 0 3  PHQ - 2 Score 0 0 0 0 0 0 6  Altered sleeping - - - - - - -  Tired, decreased energy - - - - - - -  Change in appetite - - - - - - -  Feeling bad or failure about yourself  - - - - - - -  Trouble concentrating - - - - - - -  Moving slowly or fidgety/restless - - - - - - -  Suicidal thoughts - - - - - - -  PHQ-9 Score - - - - - - -  Some recent data might be hidden    Review of Systems  Respiratory: Positive for apnea and shortness of breath.   Gastrointestinal: Positive for abdominal pain, constipation, diarrhea and nausea.  Genitourinary: Positive for dysuria.  All other systems reviewed and are negative.      Objective:   Physical Exam Patient has decreased sensation in a stocking distribution.  proprioception at the great toes Decreased light touch sensation to the foot area, but intact at the ankle on the right and decreased to the below the knee on  the left. Knee flexion normal bilaterally. Full knee extension. No evidence of knee effusion bilaterally.       Assessment & Plan:  1. Diabetic polyneuropathy. Primary Pain complaint. Would recommend Nucynta ER 150 mg twice a day in place of Opana ER 30 mg twice a day, this medication is no longer on the market  Would start a slow wean of the Xanax. Otherwise, patient can get this from her primary doctor , reduce xanax to 1 mg 3 times a day. This month with plan of reducing by 1 mg per month over the next several months   Continue tramadol, but may need to go up on this at least temporarily to 4 times a day  Discussed with patient, Shirlee Limerick with plan.

## 2016-01-09 ENCOUNTER — Telehealth: Payer: Self-pay | Admitting: *Deleted

## 2016-01-09 LAB — HEMOGLOBIN A1C
Hgb A1c MFr Bld: 8 % — ABNORMAL HIGH (ref <5.7)
Mean Plasma Glucose: 183 mg/dL

## 2016-01-09 LAB — COMPLETE METABOLIC PANEL WITH GFR
ALT: 7 U/L (ref 6–29)
AST: 9 U/L — ABNORMAL LOW (ref 10–35)
Albumin: 3.2 g/dL — ABNORMAL LOW (ref 3.6–5.1)
Alkaline Phosphatase: 74 U/L (ref 33–130)
BUN: 14 mg/dL (ref 7–25)
CO2: 28 mmol/L (ref 20–31)
Calcium: 8.8 mg/dL (ref 8.6–10.4)
Chloride: 103 mmol/L (ref 98–110)
Creat: 0.68 mg/dL (ref 0.50–0.99)
GFR, Est African American: >89 mL/min (ref >=60)
GFR, Est Non African American: >89 mL/min (ref >=60)
Glucose, Bld: 114 mg/dL — ABNORMAL HIGH (ref 65–99)
Potassium: 4.4 mmol/L (ref 3.5–5.3)
Sodium: 139 mmol/L (ref 135–146)
Total Bilirubin: 0.4 mg/dL (ref 0.2–1.2)
Total Protein: 5.6 g/dL — ABNORMAL LOW (ref 6.1–8.1)

## 2016-01-09 NOTE — Telephone Encounter (Signed)
Resubmitted prior authorization for Nucynta 150 mg ER with correct information and last clinic note attached

## 2016-01-14 ENCOUNTER — Telehealth: Payer: Self-pay | Admitting: Physical Medicine & Rehabilitation

## 2016-01-14 LAB — TOXASSURE SELECT,+ANTIDEPR,UR

## 2016-01-14 MED ORDER — ACETAMINOPHEN-CODEINE #4 300-60 MG PO TABS: 2.0000 | ORAL_TABLET | Freq: Three times a day (TID) | ORAL | 0 refills | Status: DC | PRN

## 2016-01-14 NOTE — Telephone Encounter (Signed)
I called Tylenol #4 ii po tid to pharmacy #[facility] per Dr Letta Pate verbal order and I have notified Nehemie that we have put in a new PA for high dose narcotic with [place] [employer] and hopefully we will get an approval after 24 hours.

## 2016-01-14 NOTE — Telephone Encounter (Signed)
Patient received a certified letter on Saturday that her insurance will not pay for her Nucynta.  This medication will cost her over $800 and she can't afford that.  Please let her know what she can do.  She hasn't taken anything since Wednesday.

## 2016-01-15 ENCOUNTER — Telehealth: Payer: Self-pay | Admitting: *Deleted

## 2016-01-15 NOTE — Progress Notes (Signed)
Urine drug screen for this encounter is consistent for prescribed medication 

## 2016-01-15 NOTE — Telephone Encounter (Signed)
[place] [employer] Prior Auth approved for Nucynta ER 150 mg bid 01/14/16-01/08/17.  Verified with pharmacy that claim would be paid (yes) and Jamecia notified

## 2016-01-16 ENCOUNTER — Encounter: Payer: Medicaid Other | Attending: "Endocrinology | Admitting: Nutrition

## 2016-01-16 ENCOUNTER — Encounter: Payer: Self-pay | Admitting: "Endocrinology

## 2016-01-16 ENCOUNTER — Ambulatory Visit (INDEPENDENT_AMBULATORY_CARE_PROVIDER_SITE_OTHER): Payer: Medicaid Other | Admitting: "Endocrinology

## 2016-01-16 ENCOUNTER — Telehealth: Payer: Self-pay | Admitting: *Deleted

## 2016-01-16 ENCOUNTER — Encounter: Payer: Self-pay | Admitting: Nutrition

## 2016-01-16 VITALS — BP 128/80 | HR 78 | Resp 18 | Ht 62.0 in | Wt 230.0 lb

## 2016-01-16 DIAGNOSIS — E1159 Type 2 diabetes mellitus with other circulatory complications: Secondary | ICD-10-CM | POA: Diagnosis not present

## 2016-01-16 DIAGNOSIS — E782 Mixed hyperlipidemia: Secondary | ICD-10-CM

## 2016-01-16 DIAGNOSIS — I10 Essential (primary) hypertension: Secondary | ICD-10-CM

## 2016-01-16 NOTE — Patient Instructions (Signed)
Advice for weight management -For most of us the best way to lose weight is by diet management. Generally speaking, diet management means restricting carbohydrate consumption to minimum possible (and to unprocessed or minimally processed complex starch) and increasing protein intake (animal or plant source), fruits, and vegetables.  -Sticking to a routine mealtime to eat 3 meals a day and avoiding unnecessary snacks is shown to have a big role in weight control.  -It is better to avoid simple carbohydrates including: Cakes, Desserts, Ice Cream, Soda (diet and regular), Sweet Tea, Candies, Chips, Cookies, Artificial Sweeteners, and "Sugar-free" Products.   -Exercise: 30 minutes a day 3-4 days a week, or 150 minutes a week. Combine stretch, strength, and aerobic activities. You may seek evaluation by your heart doctor prior to initiating exercise if you have high risk for heart disease.  -If you are interested, we can schedule a visit with [name], RDN, CDE for individualized nutrition education.  

## 2016-01-16 NOTE — Telephone Encounter (Addendum)
Nile called and left message she is having a reaction to her Nucynta ER. She did not say what kind of reaction.  I called back and had to leave a message for her to call us back and let us know what kind of reaction. She is having a lot of dizziness after taking the medication. She has taken 3.  She went to her diabetic doctor yesterday and almost passed out in the floor. It is making her real dizzy and feel real funny in the head. Please advise.

## 2016-01-16 NOTE — Progress Notes (Signed)
Subjective:    Patient ID: [name], female    DOB: [birth year]. Patient is being seen in f/u for management of diabetes.  Past Medical History:  Diagnosis Date  . Anxiety   . Anxiety and depression   . Arthritis   . CAD (coronary artery disease)    Stent placement circumflex coronary 2007, catheterization 2008 patent stents. Normal LV function  . Chest pain   . CHF (congestive heart failure) ([place])   . COPD (chronic obstructive pulmonary disease) ([place])   . Depression   . Diabetes mellitus    Insulin dependent  . Diabetic polyneuropathy (HCC)     severe on multiple medications  . Dyslipidemia   . GERD (gastroesophageal reflux disease)   . Headache(784.0)   . 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 MI (myocardial infarction)    2007  . Obstructive sleep apnea    CPAP machine at night  . Palpitations   . Tachycardia    Past Surgical History:  Procedure Laterality Date  . ABDOMINAL HYSTERECTOMY    . Arm Surgery Bilateral    due to fall-pt broke both forearms  . BIOPSY N/A 07/01/2013   Procedure: BIOPSY;  Surgeon: Rogene Houston, MD;  Location: [facility];  Service: Endoscopy;  Laterality: N/A;  . CHOLECYSTECTOMY    . COLONOSCOPY  08/06/2011   Procedure: COLONOSCOPY;  Surgeon: Rogene Houston, MD;  Location: [facility];  Service: Endoscopy;  Laterality: N/A;  215  . COLONOSCOPY WITH PROPOFOL N/A 12/31/2012   Procedure: COLONOSCOPY WITH PROPOFOL;  Surgeon: Rogene Houston, MD;  Location: [facility];  Service: Endoscopy;  Laterality: N/A;  in cecum at 0814, total withdrawal time 29min  . CORONARY ANGIOPLASTY WITH STENT PLACEMENT    . ESOPHAGEAL DILATION N/A 06/07/2015   Procedure: ESOPHAGEAL DILATION;  Surgeon: Rogene Houston, MD;  [facility];  Service: Endoscopy;  Laterality: N/A;  . ESOPHAGOGASTRODUODENOSCOPY N/A 06/07/2015   Procedure: ESOPHAGOGASTRODUODENOSCOPY (EGD);  Surgeon: Rogene Houston, MD;  [facility];  Service: Endoscopy;  Laterality: N/A;  2:45 -  moved to 1:00 - Ann to notify pt  . ESOPHAGOGASTRODUODENOSCOPY (EGD) WITH PROPOFOL N/A 12/31/2012   Procedure: ESOPHAGOGASTRODUODENOSCOPY (EGD) WITH PROPOFOL;  Surgeon: Rogene Houston, MD;  Location: [facility];  Service: Endoscopy;  Laterality: N/A;  GE junction 37,  . ESOPHAGOGASTRODUODENOSCOPY (EGD) WITH PROPOFOL N/A 07/01/2013   Procedure: ESOPHAGOGASTRODUODENOSCOPY (EGD) WITH PROPOFOL;  Surgeon: Rogene Houston, MD;  Location: [facility];  Service: Endoscopy;  Laterality: N/A;  950  . MALONEY DILATION N/A 12/31/2012   Procedure: MALONEY DILATION;  Surgeon: Rogene Houston, MD;  Location: [facility];  Service: Endoscopy;  Laterality: N/A;  used a # 54,#56  . MALONEY DILATION N/A 07/01/2013   Procedure: Venia Minks DILATION;  Surgeon: Rogene Houston, MD;  Location: [facility];  Service: Endoscopy;  Laterality: N/A;  54/58; no heme present   Social History   Social History  . Marital status: Legally Separated    Spouse name: N/A  . Number of children: 4  . Years of education: N/A   Occupational History  . Disabled    Social History Main Topics  . Smoking status: Never Smoker  . Smokeless tobacco: Never Used  . Alcohol use No  . Drug use: No  . Sexual activity: Not Asked   Other Topics Concern  . None   Social History Narrative   Patient does not get regular exercise   Denies caffeine use  Outpatient Encounter Prescriptions as of 01/16/2016  Medication Sig  . acetaminophen-codeine (TYLENOL #4) 300-60 MG tablet Take 2 tablets by mouth every 8 (eight) hours as needed for moderate pain.  Marland Kitchen ALPRAZolam (XANAX) 1 MG tablet Take 1 tablet (1 mg total) by mouth 3 (three) times daily.  Marland Kitchen aspirin EC 81 MG tablet Take 81 mg by mouth daily.  Marland Kitchen atorvastatin (LIPITOR) 40 MG tablet Take 40 mg by mouth daily.  . beclomethasone (QVAR) 40 MCG/ACT inhaler Inhale 1 puff into the lungs 2 (two) times daily.  Marland Kitchen dicyclomine (BENTYL) 10 MG capsule TAKE 1 CAPSULE BY MOUTH TWICE DAILY AS NEEDED FOR SPASMS.  Marland Kitchen  estrogens, conjugated, (PREMARIN) 1.25 MG tablet Take 1.25 mg by mouth daily.   . fluconazole (DIFLUCAN) 150 MG tablet Take 1 tablet (150 mg total) by mouth once.  . fluticasone-salmeterol (ADVAIR HFA) 115-21 MCG/ACT inhaler Inhale 2 puffs into the lungs 2 (two) times daily.  Marland Kitchen gabapentin (NEURONTIN) 100 MG capsule Take 1 capsule (100 mg total) by mouth 3 (three) times daily.  Marland Kitchen GLOBAL EASE INJECT PEN NEEDLES 31G X 8 MM MISC USE AS DIRECTED FOUR TIMES DAILY  . glucose blood (ACCU-CHEK AVIVA) test strip Use to test 4 times a day  . ibuprofen (ADVIL,MOTRIN) 200 MG tablet Take 200 mg by mouth daily as needed for headache.  . Insulin Glargine (LANTUS SOLOSTAR [place]) Inject 40 Units into the skin at bedtime.  . INVOKANA 100 MG TABS tablet TAKE 1 TABLET BY MOUTH DAILY BEFORE BREAKFAST  . lidocaine (XYLOCAINE) 5 % ointment Apply 1 application topically at bedtime.   . metFORMIN (GLUCOPHAGE) 500 MG tablet TAKE ONE TABLET BY MOUTH TWICE DAILY WITH A MEAL  . nitroGLYCERIN (NITROSTAT) 0.4 MG SL tablet Place 1 tablet (0.4 mg total) under the tongue every 5 (five) minutes x 3 doses as needed. For chest pains  . ondansetron (ZOFRAN) 4 MG tablet Take 4 mg by mouth every 6 (six) hours as needed for nausea or vomiting.  . pantoprazole (PROTONIX) 40 MG tablet TAKE 1 TABLET BY MOUTH TWICE DAILY BEFORE MEALS  . potassium chloride (K-DUR,KLOR-CON) 10 MEQ tablet TAKE 2 TABLETS BY MOUTH 2 TIMES DAILY.  Marland Kitchen psyllium (METAMUCIL SMOOTH TEXTURE) 28 % packet Take 1 packet by mouth at bedtime.  . ranitidine (ZANTAC) 300 MG tablet Take 300 mg by mouth 2 (two) times daily.  . Tapentadol HCl (NUCYNTA ER) 150 MG TB12 Take 150 mg by mouth 2 (two) times daily.  . TOPROL XL 50 MG 24 hr tablet TAKE 1 TABLET BY MOUTH DAILY WITH OR IMMEDIATELY FOLLOWING A MEAL.  Marland Kitchen traMADol (ULTRAM) 50 MG tablet Take 1 tablet (50 mg total) by mouth every 6 (six) hours as needed.  . traZODone (DESYREL) 25 mg TABS tablet Take 0.5 tablets (25 mg total) by  mouth at bedtime. (Patient taking differently: Take 50 mg by mouth at bedtime. )  . VOLTAREN 1 % GEL APPLY 2 GRAMS ON BOTH KNEES THREE TIMES DAILY   No facility-administered encounter medications on file as of 01/16/2016.    ALLERGIES: Allergies  Allergen Reactions  . Nitrofuran Derivatives Itching and Swelling  . Amphetamine-Dextroamphet Er Swelling  . Pregabalin Swelling  . Topiramate Other (See Comments)    Tongue tingle  . Verelan [Verapamil] Rash   VACCINATION STATUS:  There is no immunization history on file for this patient.  Diabetes  She presents for her follow-up diabetic visit. She has type 2 diabetes mellitus. Onset time: She was diagnosed at approximate  age of 59 years. Her disease course has been stable. There are no hypoglycemic associated symptoms. Pertinent negatives for hypoglycemia include no confusion, headaches, pallor or seizures. Associated symptoms include fatigue. Pertinent negatives for diabetes include no chest pain, no polydipsia, no polyphagia and no polyuria. There are no hypoglycemic complications. Symptoms are stable. Diabetic complications include autonomic neuropathy, heart disease, peripheral neuropathy and retinopathy. Risk factors for coronary artery disease include dyslipidemia, diabetes mellitus, obesity, hypertension and sedentary lifestyle. Current diabetic treatments: Lantus 65 units daily at bedtime, Humalog 15 units 3 times a day before meals. She mentions allergy to Victoza, Byetta, and metformin. Her weight is decreasing steadily (She lost 29 pounds overall.). She is following a generally unhealthy diet. When asked about meal planning, she reported none. She has not had a previous visit with a dietitian. She never participates in exercise. Home blood sugar record trend: She did not bring her logs however her meter shows average of 1 50 g/dL for the last 7 days. Her breakfast blood glucose range is generally 140-180 mg/dl. Her overall blood glucose  range is 140-180 mg/dl. An ACE inhibitor/angiotensin II receptor blocker is not being taken. Eye exam is current.  Hyperlipidemia  This is a chronic problem. The current episode started more than 1 year ago. The problem is uncontrolled. Recent lipid tests were reviewed and are high. Exacerbating diseases include diabetes and obesity. Pertinent negatives include no chest pain, myalgias or shortness of breath. Current antihyperlipidemic treatment includes statins. Risk factors for coronary artery disease include diabetes mellitus, dyslipidemia, hypertension, obesity and a sedentary lifestyle.  Hypertension  This is a chronic problem. The current episode started more than 1 year ago. Pertinent negatives include no chest pain, headaches, palpitations or shortness of breath. Risk factors for coronary artery disease include diabetes mellitus, dyslipidemia, obesity and sedentary lifestyle. Hypertensive end-organ damage includes CAD/MI and retinopathy.    Review of Systems  Constitutional: Positive for fatigue. Negative for chills, fever and unexpected weight change.  HENT: Negative for trouble swallowing and voice change.   Eyes: Negative for visual disturbance.  Respiratory: Negative for cough, shortness of breath and wheezing.   Cardiovascular: Negative for chest pain, palpitations and leg swelling.  Gastrointestinal: Negative for diarrhea, nausea and vomiting.  Endocrine: Negative for cold intolerance, heat intolerance, polydipsia, polyphagia and polyuria.  Musculoskeletal: Negative for arthralgias and myalgias.  Skin: Negative for color change, pallor, rash and wound.  Neurological: Negative for seizures and headaches.  Psychiatric/Behavioral: Negative for confusion and suicidal ideas.    Objective:    BP 128/80   Pulse 78   Resp 18   Ht 5\' 2"  (1.575 m)   Wt 230 lb (104.3 kg)   SpO2 94%   BMI 42.07 kg/m   Wt Readings from Last 3 Encounters:  01/16/16 230 lb (104.3 kg)  11/27/15 235 lb  (106.6 kg)  10/26/15 235 lb (106.6 kg)    Physical Exam  Constitutional: She is oriented to person, place, and time. She appears well-developed.  HENT:  Head: Normocephalic and atraumatic.  She has very poor dentition.  Eyes: EOM are normal.  Neck: Normal range of motion. Neck supple. No tracheal deviation present. No thyromegaly present.  Cardiovascular: Normal rate and regular rhythm.   Pulmonary/Chest: Effort normal and breath sounds normal.  Abdominal: Soft. Bowel sounds are normal. There is no tenderness. There is no guarding.  Musculoskeletal: Normal range of motion. She exhibits no edema.  Neurological: She is alert and oriented to person, place, and time. She  has normal reflexes. No cranial nerve deficit. Coordination normal.  Skin: Skin is warm and dry. No rash noted. No erythema. No pallor.  Psychiatric: She has a normal mood and affect. Judgment normal.    CMP     Component Value Date/Time   NA 139 01/08/2016 1529   NA 142 10/04/2015 1256   K 4.4 01/08/2016 1529   CL 103 01/08/2016 1529   CO2 28 01/08/2016 1529   GLUCOSE 114 (H) 01/08/2016 1529   BUN 14 01/08/2016 1529   BUN 11 10/04/2015 1256   CREATININE 0.68 01/08/2016 1529   CALCIUM 8.8 01/08/2016 1529   PROT 5.6 (L) 01/08/2016 1529   PROT 6.1 10/04/2015 1256   ALBUMIN 3.2 (L) 01/08/2016 1529   ALBUMIN 3.5 (L) 10/04/2015 1256   AST 9 (L) 01/08/2016 1529   ALT 7 01/08/2016 1529   ALKPHOS 74 01/08/2016 1529   BILITOT 0.4 01/08/2016 1529   BILITOT 0.5 10/04/2015 1256   GFRNONAA >89 01/08/2016 1529   GFRAA >89 01/08/2016 1529   Diabetic Labs (most recent): Lab Results  Component Value Date   HGBA1C 8.0 (H) 01/08/2016   HGBA1C 7.8 (H) 10/04/2015   HGBA1C 7.1 (H) 07/05/2015    Lipid Panel     Component Value Date/Time   CHOL 156 07/05/2015 0937   TRIG 267 (H) 07/05/2015 0937   HDL 58 07/05/2015 0937   CHOLHDL 2.7 07/05/2015 0937   VLDL 53 (H) 07/05/2015 0937   LDLCALC 45 07/05/2015 0937      Assessment & Plan:   1. Type 2 diabetes mellitus with vascular disease ([place])  - Patient has currently uncontrolled symptomatic type 2 DM since  65 years of age,  with most recent A1c Stable at  8% ,  generally improving from 8.6 %. Recent labs reviewed. -She came with tightly controlled blood glucose profile mainly because she did use Lantus 30 units 3 times a day.    Her diabetes is complicated by coronary artery disease status post stent placement and patient remains at a high risk for more acute and chronic complications of diabetes which include CAD, CVA, CKD, retinopathy, and neuropathy. These are all discussed in detail with the patient.  - I have counseled the patient on diet management and weight loss, by adopting a carbohydrate restricted/protein rich diet. - She came with overall weight loss of 23 pounds since February 2017. - Suggestion is made for patient to avoid simple carbohydrates   from their diet including Cakes , Desserts, Ice Cream,  Soda (  diet and regular) , Sweet Tea , Candies,  Chips, Cookies, Artificial Sweeteners,   and "Sugar-free" Products . This will help patient to have stable blood glucose profile and potentially avoid unintended weight gain.  - I encouraged the patient to switch to  unprocessed or minimally processed complex starch and increased protein intake (animal or plant source), fruits, and vegetables.  - Patient is advised to stick to a routine mealtimes to eat 3 meals  a day and avoid unnecessary snacks ( to snack only to correct hypoglycemia).  - The patient will be scheduled with Jearld Fenton, RDN, CDE for individualized DM education.  - I have approached patient with the following individualized plan to manage diabetes and patient agrees:   - She has normal renal function. -I will proceed  with Lantus  40 units daily at bedtime and  continue to hold off prandial insulin Humalog for now .    - I advised her to continue  monitoring of blood  glucose before meals breakfast and at bedtime.  - Patient is warned not to take insulin without proper monitoring per orders. -Adjustment parameters are given for hypo and hyperglycemia in writing. -Patient is encouraged to call clinic for blood glucose levels less than 70 or above 200 mg /dl. -She mentions allergy to a number of use for medications unfortunately, Victoza, Byetta. - She is  tolerating  metformin , I will continue  metformin 500 mg by mouth twice a day. -She will continue Invokana 100 mg by mouth every morning. -Side effects and precautions discussed with hershe was treated for yeast infection with Diflucan once after her last visit. - Patient specific target  A1c;  LDL, HDL, Triglycerides, and  Waist Circumference were discussed in detail.  2) BP/HTN: Controlled. Continue current medications . 3) Lipids/HPL:  Uncontrolled with triglycerides of 267 improving from  902, continue  Lipitor 40 mg by mouth daily at bedtime. Better control of diabetes will help for triglycerides. 4)  Weight/Diet: CDE Consult in progress  , exercise, and detailed carbohydrates information provided.  5) Chronic Care/Health Maintenance:  -Patient is on Statin medications and encouraged to continue to follow up with Ophthalmology, Podiatrist at least yearly or according to recommendations, and advised to   stay away from smoking. I have recommended yearly flu vaccine and pneumonia vaccination at least every 5 years; moderate intensity exercise for up to 150 minutes weekly; and  sleep for at least 7 hours a day.  - 30 minutes of time was spent on the care of this patient , 50% of which was applied for counseling on diabetes complications and their preventions.  - Patient to bring meter and  blood glucose logs during their next visit.   - I advised patient to maintain close follow up with Glenda Chroman, MD for primary care needs.  Follow up plan: - Return in about 3 months (around 04/17/2016) for follow up  with pre-visit labs, meter, and logs.  Glade Lloyd, MD Phone: [phone]  Fax: [phone]   01/16/2016, 10:41 AM

## 2016-01-16 NOTE — Progress Notes (Addendum)
Pt. Attempted to come to office after seeing Dr. Dorris Fetch today. She immediately felt dizzy when she got up and complained of pain and felt like she was going to pass out.Loma Sousa, LPN and Dr. Dorris Fetch assisted to get pt into a chair. Nurse obtained pt's blood pressure and BS. BS 212 mg/dl. BP 142/78.     Pt. Notes she started on a new pain medication recently.     Pt. deferred appt today and I will see her at her next visit.     She was assisted to her car in wheel chair by Loma Sousa and her aide. Wt Readings from Last 3 Encounters:  01/16/16 230 lb (104.3 kg)  11/27/15 235 lb (106.6 kg)  10/26/15 235 lb (106.6 kg)   Ht Readings from Last 3 Encounters:  01/16/16 5\' 2"  (1.575 m)  11/27/15 5\' 2"  (1.575 m)  10/26/15 5\' 2"  (1.575 m)

## 2016-01-17 MED ORDER — TAPENTADOL HCL ER 100 MG PO TB12: 100.0000 mg | ORAL_TABLET | Freq: Two times a day (BID) | ORAL | 0 refills | Status: DC

## 2016-01-17 NOTE — Telephone Encounter (Signed)
Her appt is in 3 weeks and medicaid copay is same for 2 weeks or full month supply so Rx written for #60 since she lives in [place] and has transoportation issues.  A new PA  filed with [facility]. She will need to bring the 150 mg to be destroyed when they pick up the new RX for 100 mg.

## 2016-01-17 NOTE — Telephone Encounter (Signed)
~  24% of pt experience some dizziness  May need to try lower dose, write rx for Nucynta ER 174m BID, #28

## 2016-01-18 ENCOUNTER — Telehealth: Payer: Self-pay | Admitting: *Deleted

## 2016-01-18 NOTE — Telephone Encounter (Signed)
Ms [name] thought we called  We didn't.  I informed her, though, that her medication is available and was approved by Medicaid.

## 2016-01-18 NOTE — Telephone Encounter (Signed)
PA approved by [facility] 01/17/16- 01/11/2017 for Nucynta 100 mg ER bid #60

## 2016-01-18 NOTE — Telephone Encounter (Signed)
I notified [name] that the new medication dose RX is available for pick up at the front desk and then new PA is still pending with Medicaid.She will need to bring the 150 mg tablets back to be destroyed. When asked how she was feeling she says she is still feeling very funny in her head.  She has not yet taken a dose of the 150 mg Nucynta.  She says she is in a lot of pain and her back is "killing her".

## 2016-01-18 NOTE — Telephone Encounter (Signed)
Please complete Rx for Nucynta ER 100 mg twice a day #60

## 2016-01-21 ENCOUNTER — Telehealth: Payer: Self-pay | Admitting: Physical Medicine & Rehabilitation

## 2016-01-21 NOTE — Telephone Encounter (Signed)
Patient needs prior authorization on Nucynta per her pharmacy.

## 2016-01-21 NOTE — Telephone Encounter (Signed)
Medication was approved.  I spoke with the pharmacy and the mistake ws on their end. [name] notified.  [name] came by and picke up peds and #52 of the 150mg  Nucynta ER was destroyed.

## 2016-01-24 ENCOUNTER — Other Ambulatory Visit: Payer: Self-pay | Admitting: "Endocrinology

## 2016-02-05 ENCOUNTER — Encounter: Payer: Medicaid Other | Attending: Physical Medicine & Rehabilitation | Admitting: Registered Nurse

## 2016-02-05 ENCOUNTER — Encounter: Payer: Self-pay | Admitting: Registered Nurse

## 2016-02-05 VITALS — BP 136/81 | HR 84

## 2016-02-05 DIAGNOSIS — M1990 Unspecified osteoarthritis, unspecified site: Secondary | ICD-10-CM | POA: Insufficient documentation

## 2016-02-05 DIAGNOSIS — M25562 Pain in left knee: Secondary | ICD-10-CM

## 2016-02-05 DIAGNOSIS — M79605 Pain in left leg: Secondary | ICD-10-CM | POA: Diagnosis not present

## 2016-02-05 DIAGNOSIS — R002 Palpitations: Secondary | ICD-10-CM | POA: Diagnosis not present

## 2016-02-05 DIAGNOSIS — M5489 Other dorsalgia: Secondary | ICD-10-CM | POA: Insufficient documentation

## 2016-02-05 DIAGNOSIS — E1142 Type 2 diabetes mellitus with diabetic polyneuropathy: Secondary | ICD-10-CM | POA: Diagnosis not present

## 2016-02-05 DIAGNOSIS — Z79899 Other long term (current) drug therapy: Secondary | ICD-10-CM

## 2016-02-05 DIAGNOSIS — G4733 Obstructive sleep apnea (adult) (pediatric): Secondary | ICD-10-CM | POA: Insufficient documentation

## 2016-02-05 DIAGNOSIS — G894 Chronic pain syndrome: Secondary | ICD-10-CM

## 2016-02-05 DIAGNOSIS — G4700 Insomnia, unspecified: Secondary | ICD-10-CM | POA: Diagnosis not present

## 2016-02-05 DIAGNOSIS — F411 Generalized anxiety disorder: Secondary | ICD-10-CM

## 2016-02-05 DIAGNOSIS — Z5181 Encounter for therapeutic drug level monitoring: Secondary | ICD-10-CM

## 2016-02-05 DIAGNOSIS — R51 Headache: Secondary | ICD-10-CM | POA: Diagnosis not present

## 2016-02-05 DIAGNOSIS — E785 Hyperlipidemia, unspecified: Secondary | ICD-10-CM | POA: Diagnosis not present

## 2016-02-05 DIAGNOSIS — E1140 Type 2 diabetes mellitus with diabetic neuropathy, unspecified: Secondary | ICD-10-CM | POA: Diagnosis not present

## 2016-02-05 DIAGNOSIS — G8929 Other chronic pain: Secondary | ICD-10-CM

## 2016-02-05 DIAGNOSIS — R079 Chest pain, unspecified: Secondary | ICD-10-CM | POA: Insufficient documentation

## 2016-02-05 DIAGNOSIS — M47896 Other spondylosis, lumbar region: Secondary | ICD-10-CM | POA: Diagnosis not present

## 2016-02-05 DIAGNOSIS — F3289 Other specified depressive episodes: Secondary | ICD-10-CM

## 2016-02-05 DIAGNOSIS — M79604 Pain in right leg: Secondary | ICD-10-CM | POA: Insufficient documentation

## 2016-02-05 DIAGNOSIS — J449 Chronic obstructive pulmonary disease, unspecified: Secondary | ICD-10-CM | POA: Diagnosis not present

## 2016-02-05 DIAGNOSIS — M5416 Radiculopathy, lumbar region: Secondary | ICD-10-CM

## 2016-02-05 DIAGNOSIS — M47816 Spondylosis without myelopathy or radiculopathy, lumbar region: Secondary | ICD-10-CM

## 2016-02-05 DIAGNOSIS — Z794 Long term (current) use of insulin: Secondary | ICD-10-CM | POA: Diagnosis not present

## 2016-02-05 DIAGNOSIS — K219 Gastro-esophageal reflux disease without esophagitis: Secondary | ICD-10-CM | POA: Insufficient documentation

## 2016-02-05 DIAGNOSIS — R000 Tachycardia, unspecified: Secondary | ICD-10-CM | POA: Diagnosis not present

## 2016-02-05 DIAGNOSIS — Z760 Encounter for issue of repeat prescription: Secondary | ICD-10-CM | POA: Diagnosis present

## 2016-02-05 DIAGNOSIS — M25561 Pain in right knee: Secondary | ICD-10-CM

## 2016-02-05 DIAGNOSIS — F418 Other specified anxiety disorders: Secondary | ICD-10-CM | POA: Diagnosis not present

## 2016-02-05 DIAGNOSIS — Z1401 Asymptomatic hemophilia A carrier: Secondary | ICD-10-CM | POA: Insufficient documentation

## 2016-02-05 DIAGNOSIS — I2510 Atherosclerotic heart disease of native coronary artery without angina pectoris: Secondary | ICD-10-CM | POA: Diagnosis not present

## 2016-02-05 DIAGNOSIS — I252 Old myocardial infarction: Secondary | ICD-10-CM | POA: Insufficient documentation

## 2016-02-05 DIAGNOSIS — I509 Heart failure, unspecified: Secondary | ICD-10-CM | POA: Insufficient documentation

## 2016-02-05 MED ORDER — ALPRAZOLAM 1 MG PO TABS: 1.0000 mg | ORAL_TABLET | Freq: Three times a day (TID) | ORAL | 0 refills | Status: DC

## 2016-02-05 MED ORDER — MORPHINE SULFATE ER 30 MG PO TBCR: 30.0000 mg | EXTENDED_RELEASE_TABLET | Freq: Three times a day (TID) | ORAL | 0 refills | Status: DC

## 2016-02-05 NOTE — Progress Notes (Signed)
Subjective:    Patient ID: [name], female    DOB: 08-09-1950, 65 y.o.   MRN: [id number]  HPI: [name] is a 65 year old female who returns for follow up for chronic pain and medication refill. She states her pain is located in her bilateral shoulders, mid-lower back,lower extremities, bilateralknees and bilateral feet. Also states she's having generalized pain all over. [name] states her pain has increased in intensity and her pain is not controlled since being changed  from Opana to Tapentadol,. When she was prescribed 150 mg she experience side effects and dose changed to 100 mg with ineffective relief of pain. [name] very tearful and hypersentivity noted with assessment. The Medicaid refered drug list was reviewed, spoke with Dr. Letta Pate, we will prescribe MS Contin 30 mg  TID. [name] understanding. [place] [facility] Prior Authorization will be perfomed today,placed a call to [employer], he states they will destroy the Tapentadol once her MS Contin is approved. She verbalizes understanding. Her current exercise regime is walking short distances with her walker.    Pain Inventory Average Pain 8 Pain Right Now 10 My pain is sharp, burning, dull, stabbing, tingling and aching  In the last 24 hours, has pain interfered with the following? General activity 7 Relation with others 7 Enjoyment of life 8 What TIME of day is your pain at its worst? night Sleep (in general) Fair  Pain is worse with: walking, bending, standing and some activites Pain improves with: rest and medication Relief from Meds: 2  Mobility walk with assistance use a walker ability to climb steps?  no do you drive?  no Do you have any goals in this area?  no  Function disabled: date disabled . I need assistance with the following:  dressing, bathing, meal prep, household duties and shopping Do you have any goals in this area?  no  Neuro/Psych bladder control  problems weakness numbness tingling trouble walking dizziness depression anxiety  Prior Studies Any changes since last visit?  no  Physicians involved in your care Any changes since last visit?  no   Family History  Problem Relation Age of Onset  . Heart attack Mother   . Colon cancer Mother   . Heart attack Father   . Stroke Sister   . Hemophilia Child   . Cancer    . Coronary artery disease    . Cancer Brother   . Cancer Maternal Aunt   . Cancer Maternal Uncle   . Cancer Paternal Aunt   . Cancer Paternal Uncle   . Cancer Brother    Social History   Social History  . Marital status: Legally Separated    Spouse name: N/A  . Number of children: 4  . Years of education: N/A   Occupational History  . Disabled    Social History Main Topics  . Smoking status: Never Smoker  . Smokeless tobacco: Never Used  . Alcohol use No  . Drug use: No  . Sexual activity: Not Asked   Other Topics Concern  . None   Social History Narrative   Patient does not get regular exercise   Denies caffeine use    Past Surgical History:  Procedure Laterality Date  . ABDOMINAL HYSTERECTOMY    . Arm Surgery Bilateral    due to fall-pt broke both forearms  . BIOPSY N/A 07/01/2013   Procedure: BIOPSY;  Surgeon: Rogene Houston, MD;  Location: [facility];  Service: Endoscopy;  Laterality: N/A;  . CHOLECYSTECTOMY    . COLONOSCOPY  08/06/2011   Procedure: COLONOSCOPY;  Surgeon: Rogene Houston, MD;  [facility];  Service: Endoscopy;  Laterality: N/A;  215  . COLONOSCOPY WITH PROPOFOL N/A 12/31/2012   Procedure: COLONOSCOPY WITH PROPOFOL;  Surgeon: Rogene Houston, MD;  Location: [facility];  Service: Endoscopy;  Laterality: N/A;  in cecum at 0814, total withdrawal time 77mn  . CORONARY ANGIOPLASTY WITH STENT PLACEMENT    . ESOPHAGEAL DILATION N/A 06/07/2015   Procedure: ESOPHAGEAL DILATION;  Surgeon: NRogene Houston MD;  [facility];  Service: Endoscopy;  Laterality:  N/A;  . ESOPHAGOGASTRODUODENOSCOPY N/A 06/07/2015   Procedure: ESOPHAGOGASTRODUODENOSCOPY (EGD);  Surgeon: NRogene Houston MD;  [facility];  Service: Endoscopy;  Laterality: N/A;  2:45 - moved to 1:00 - Ann to notify pt  . ESOPHAGOGASTRODUODENOSCOPY (EGD) WITH PROPOFOL N/A 12/31/2012   Procedure: ESOPHAGOGASTRODUODENOSCOPY (EGD) WITH PROPOFOL;  Surgeon: NRogene Houston MD;  [facility];  Service: Endoscopy;  Laterality: N/A;  GE junction 37,  . ESOPHAGOGASTRODUODENOSCOPY (EGD) WITH PROPOFOL N/A 07/01/2013   Procedure: ESOPHAGOGASTRODUODENOSCOPY (EGD) WITH PROPOFOL;  Surgeon: NRogene Houston MD;  [facility];  Service: Endoscopy;  Laterality: N/A;  950  . MALONEY DILATION N/A 12/31/2012   Procedure: MALONEY DILATION;  Surgeon: NRogene Houston MD;  Location: [facility];  Service: Endoscopy;  Laterality: N/A;  used a # 54,#56  . MALONEY DILATION N/A 07/01/2013   Procedure: MVenia MinksDILATION;  Surgeon: NRogene Houston MD;  Location: [facility];  Service: Endoscopy;  Laterality: N/A;  54/58; no heme present   Past Medical History:  Diagnosis Date  . Anxiety   . Anxiety and depression   . Arthritis   . CAD (coronary artery disease)    Stent placement circumflex coronary 2007, catheterization 2008 patent stents. Normal LV function  . Chest pain   . [place]   . COPD (chronic obstructive pulmonary disease) ([place]   . Depression   . Diabetes mellitus    Insulin dependent  . Diabetic polyneuropathy (HCC)     severe on multiple medications  . Dyslipidemia   . GERD (gastroesophageal reflux disease)   . Headache(784.0)   . 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 MI (myocardial infarction)    2007  . Obstructive sleep apnea    CPAP machine at night  . Palpitations   . Tachycardia    BP 136/81 (BP Location: Left Arm, Patient Position: Sitting, Cuff Size: Large)   Pulse 84   SpO2 94%   Opioid Risk Score:   Fall Risk Score:  `1  Depression screen PHQ  2/9  [facility] 10/11/2015 07/12/2015 07/12/2015 05/14/2015 05/14/2015 05/02/2015 12/11/2014  Decreased Interest 0 0 0 0 0 0 3  Down, Depressed, Hopeless 0 0 0 0 0 0 3  PHQ - 2 Score 0 0 0 0 0 0 6  Altered sleeping - - - - - - -  Tired, decreased energy - - - - - - -  Change in appetite - - - - - - -  Feeling bad or failure about yourself  - - - - - - -  Trouble concentrating - - - - - - -  Moving slowly or fidgety/restless - - - - - - -  Suicidal thoughts - - - - - - -  PHQ-9 Score - - - - - - -  Some recent data might be hidden   Review of Systems  HENT: Negative.   Eyes: Negative.   Respiratory: Positive for apnea and shortness of breath.   Cardiovascular: Positive for leg swelling.  Gastrointestinal: Positive for abdominal pain, constipation, diarrhea, nausea and vomiting.  Endocrine: Negative.   Genitourinary: Positive for difficulty urinating.  Musculoskeletal: Positive for arthralgias, back pain, gait problem, joint swelling, myalgias, neck pain and neck stiffness.       Spasms  Skin: Negative.   Allergic/Immunologic: Negative.   Neurological: Positive for dizziness, weakness and numbness.       Tingling  Hematological: Negative.   Psychiatric/Behavioral: Positive for dysphoric mood. The patient is nervous/anxious.   All other systems reviewed and are negative.      Objective:   Physical Exam  Constitutional: She is oriented to person, place, and time. She appears well-developed and well-nourished.  HENT:  Head: Normocephalic and atraumatic.  Neck: Normal range of motion. Neck supple.  Cardiovascular: Normal rate and regular rhythm.   Pulmonary/Chest: Effort normal and breath sounds normal.  Musculoskeletal:  Normal Muscle Bulk and Muscle Testing Reveals: Upper Extremities: Full ROM and Muscle Strength 5/5 Thoracic and Lumbar Hypersensitivity Lower Extremities: Full ROM and Muscle Strength 5/5 Arises from Table slowly, using walker for support Antalgic  Gait  Neurological: She is alert and oriented to person, place, and time.  Skin: Skin is warm and dry.  Psychiatric: She has a normal mood and affect.  Nursing note and vitals reviewed.         Assessment & Plan:  1.Lumbar pain lumbar spondylosis: Encouraged to increase Activity as tolerated and continue HEP. Tapentadol Discontinued. RX: MS Contin 30 MG one tablet every 8 hours #90  and Continue Tramadol 50 mg one tablet every 6 hours as needed #120 We will continue the opioid monitoring program, this consists of regular clinic visits, examinations, urine drug screen, pill counts as well as use of [place] Controlled Substance Reporting System. 2. Bilateral Knee Pain/ Degenerative: Continue Voltaren Gel. Continue to Monitor 3. Severe diabetic poly neuropathy: Continue Gabapentin. 4. Insomnia: On Trazodone PCP Following   30 minutes of face to face patient care time was spent during this visit. All questions were encouraged and answered.   F/U in 1 month

## 2016-02-06 ENCOUNTER — Telehealth: Payer: Self-pay | Admitting: *Deleted

## 2016-02-06 NOTE — Telephone Encounter (Signed)
prior authorization submitted for morphine sulfate 30 mg tablet to [place] [facility]

## 2016-02-18 LAB — HM DIABETES EYE EXAM

## 2016-02-25 ENCOUNTER — Other Ambulatory Visit: Payer: Self-pay | Admitting: Physical Medicine & Rehabilitation

## 2016-03-03 ENCOUNTER — Telehealth: Payer: Self-pay

## 2016-03-03 NOTE — Telephone Encounter (Signed)
Patient has called stating she had a question for Danella Sensing and would like a call back. Returned patient call and  informed patient that [name] was out of the office until the end of the week.I asked patient  were there any questions or concerns that I could help her with. Patient states "she can no longer attend the pain clinic because of medicaid. Medicaid will not pay for her to continue with the pain clinic and that she needs to see her PCP"

## 2016-03-12 NOTE — Telephone Encounter (Signed)
Return [name] call, she was instructed to call Medicaid for clarification of the letter she received, also states her PCP doesn't feel comfortable writing her narcotics.

## 2016-03-13 ENCOUNTER — Telehealth: Payer: Self-pay | Admitting: Registered Nurse

## 2016-03-13 ENCOUNTER — Encounter: Payer: Medicaid Other | Admitting: Registered Nurse

## 2016-03-13 NOTE — Telephone Encounter (Signed)
Spoke with [name], she called her Insurance account manager, she is allowed to come to our office. Medicaid wants one provider to write for narcotics. She was given an appointment for January 22nd at 2:00 p.m.

## 2016-03-13 NOTE — Telephone Encounter (Signed)
Return Ms. Height call, left message to return the call.

## 2016-04-07 ENCOUNTER — Encounter: Payer: Medicaid Other | Attending: Physical Medicine & Rehabilitation | Admitting: Registered Nurse

## 2016-04-07 ENCOUNTER — Encounter: Payer: Self-pay | Admitting: Registered Nurse

## 2016-04-07 VITALS — BP 116/69 | HR 85 | Resp 14

## 2016-04-07 DIAGNOSIS — R51 Headache: Secondary | ICD-10-CM | POA: Diagnosis not present

## 2016-04-07 DIAGNOSIS — M5489 Other dorsalgia: Secondary | ICD-10-CM | POA: Diagnosis not present

## 2016-04-07 DIAGNOSIS — M79604 Pain in right leg: Secondary | ICD-10-CM | POA: Diagnosis not present

## 2016-04-07 DIAGNOSIS — F418 Other specified anxiety disorders: Secondary | ICD-10-CM | POA: Diagnosis not present

## 2016-04-07 DIAGNOSIS — R000 Tachycardia, unspecified: Secondary | ICD-10-CM | POA: Diagnosis not present

## 2016-04-07 DIAGNOSIS — I509 Heart failure, unspecified: Secondary | ICD-10-CM | POA: Insufficient documentation

## 2016-04-07 DIAGNOSIS — E1140 Type 2 diabetes mellitus with diabetic neuropathy, unspecified: Secondary | ICD-10-CM

## 2016-04-07 DIAGNOSIS — I2510 Atherosclerotic heart disease of native coronary artery without angina pectoris: Secondary | ICD-10-CM | POA: Insufficient documentation

## 2016-04-07 DIAGNOSIS — F411 Generalized anxiety disorder: Secondary | ICD-10-CM

## 2016-04-07 DIAGNOSIS — Z794 Long term (current) use of insulin: Secondary | ICD-10-CM | POA: Diagnosis not present

## 2016-04-07 DIAGNOSIS — M25561 Pain in right knee: Secondary | ICD-10-CM

## 2016-04-07 DIAGNOSIS — J449 Chronic obstructive pulmonary disease, unspecified: Secondary | ICD-10-CM | POA: Insufficient documentation

## 2016-04-07 DIAGNOSIS — R002 Palpitations: Secondary | ICD-10-CM | POA: Diagnosis not present

## 2016-04-07 DIAGNOSIS — M47816 Spondylosis without myelopathy or radiculopathy, lumbar region: Secondary | ICD-10-CM

## 2016-04-07 DIAGNOSIS — Z1401 Asymptomatic hemophilia A carrier: Secondary | ICD-10-CM | POA: Diagnosis not present

## 2016-04-07 DIAGNOSIS — I252 Old myocardial infarction: Secondary | ICD-10-CM | POA: Diagnosis not present

## 2016-04-07 DIAGNOSIS — M1990 Unspecified osteoarthritis, unspecified site: Secondary | ICD-10-CM | POA: Diagnosis not present

## 2016-04-07 DIAGNOSIS — Z760 Encounter for issue of repeat prescription: Secondary | ICD-10-CM | POA: Diagnosis present

## 2016-04-07 DIAGNOSIS — G8929 Other chronic pain: Secondary | ICD-10-CM

## 2016-04-07 DIAGNOSIS — K219 Gastro-esophageal reflux disease without esophagitis: Secondary | ICD-10-CM | POA: Diagnosis not present

## 2016-04-07 DIAGNOSIS — R079 Chest pain, unspecified: Secondary | ICD-10-CM | POA: Insufficient documentation

## 2016-04-07 DIAGNOSIS — M47896 Other spondylosis, lumbar region: Secondary | ICD-10-CM | POA: Insufficient documentation

## 2016-04-07 DIAGNOSIS — G4700 Insomnia, unspecified: Secondary | ICD-10-CM | POA: Diagnosis not present

## 2016-04-07 DIAGNOSIS — G4733 Obstructive sleep apnea (adult) (pediatric): Secondary | ICD-10-CM | POA: Insufficient documentation

## 2016-04-07 DIAGNOSIS — E1142 Type 2 diabetes mellitus with diabetic polyneuropathy: Secondary | ICD-10-CM | POA: Insufficient documentation

## 2016-04-07 DIAGNOSIS — E785 Hyperlipidemia, unspecified: Secondary | ICD-10-CM | POA: Diagnosis not present

## 2016-04-07 DIAGNOSIS — M5416 Radiculopathy, lumbar region: Secondary | ICD-10-CM | POA: Diagnosis not present

## 2016-04-07 DIAGNOSIS — M79605 Pain in left leg: Secondary | ICD-10-CM | POA: Diagnosis not present

## 2016-04-07 DIAGNOSIS — M25562 Pain in left knee: Secondary | ICD-10-CM

## 2016-04-07 MED ORDER — MORPHINE SULFATE ER 30 MG PO TBCR: 30.0000 mg | EXTENDED_RELEASE_TABLET | Freq: Three times a day (TID) | ORAL | 0 refills | Status: DC

## 2016-04-07 MED ORDER — ALPRAZOLAM 1 MG PO TABS: 1.0000 mg | ORAL_TABLET | Freq: Three times a day (TID) | ORAL | 0 refills | Status: DC

## 2016-04-07 MED ORDER — TRAMADOL HCL 50 MG PO TABS: 50.0000 mg | ORAL_TABLET | Freq: Four times a day (QID) | ORAL | 2 refills | Status: DC | PRN

## 2016-04-07 NOTE — Patient Instructions (Signed)
Increase Gabapentin to two capsules in the morning, one capsule in the afternoon and two capsules at bedtime

## 2016-04-07 NOTE — Progress Notes (Signed)
Subjective:    Patient ID: [name], female    DOB: 04-27-1950, 66 y.o.   MRN: [id number]  HPI: [name] is a 66 year old female who returns for follow up appointment for chronic pain and medication refill. She states her pain is located in her bilateral shoulders,mid-lower back,lower extremities, bilateralkneesand bilateral feet. Also states she's having generalized pain all over.She rates her pain 9.Her current exercise regime is walking short distances with her.  [name] is on the [facility], all narcotics have to be prescribed by one provider, letter reviewed.    Pain Inventory Average Pain 8 Pain Right Now 9 My pain is sharp, burning, dull, stabbing, tingling and aching  In the last 24 hours, has pain interfered with the following? General activity 7 Relation with others 7 Enjoyment of life 7 What TIME of day is your pain at its worst? morning,night Sleep (in general) Poor  Pain is worse with: walking, sitting, standing and some activites Pain improves with: rest and injections Relief from Meds: 5  Mobility walk with assistance use a walker how many minutes can you walk? 5 ability to climb steps?  no do you drive?  no needs help with transfers Do you have any goals in this area?  no  Function disabled: date disabled n/a retired I need assistance with the following:  dressing, bathing, meal prep, household duties and shopping Do you have any goals in this area?  no  Neuro/Psych bladder control problems weakness numbness tingling spasms dizziness confusion depression anxiety  Prior Studies Any changes since last visit?  no  Physicians involved in your care Any changes since last visit?  no   Family History  Problem Relation Age of Onset  . Heart attack Mother   . Colon cancer Mother   . Heart attack Father   . Stroke Sister   . Hemophilia Child   . Cancer    . Coronary artery disease    . Cancer Brother   .  Cancer Maternal Aunt   . Cancer Maternal Uncle   . Cancer Paternal Aunt   . Cancer Paternal Uncle   . Cancer Brother    Social History   Social History  . Marital status: Legally Separated    Spouse name: N/A  . Number of children: 4  . Years of education: N/A   Occupational History  . Disabled    Social History Main Topics  . Smoking status: Never Smoker  . Smokeless tobacco: Never Used  . Alcohol use No  . Drug use: No  . Sexual activity: Not Asked   Other Topics Concern  . None   Social History Narrative   Patient does not get regular exercise   Denies caffeine use    Past Surgical History:  Procedure Laterality Date  . ABDOMINAL HYSTERECTOMY    . Arm Surgery Bilateral    due to fall-pt broke both forearms  . BIOPSY N/A 07/01/2013   Procedure: BIOPSY;  Surgeon: Rogene Houston, MD;  [facility];  Service: Endoscopy;  Laterality: N/A;  . CHOLECYSTECTOMY    . COLONOSCOPY  08/06/2011   Procedure: COLONOSCOPY;  Surgeon: Rogene Houston, MD;  [facility];  Service: Endoscopy;  Laterality: N/A;  215  . COLONOSCOPY WITH PROPOFOL N/A 12/31/2012   Procedure: COLONOSCOPY WITH PROPOFOL;  Surgeon: Rogene Houston, MD;  Location: [facility];  Service: Endoscopy;  Laterality: N/A;  in cecum at 0814, total withdrawal  time 74min  . CORONARY ANGIOPLASTY WITH STENT PLACEMENT    . ESOPHAGEAL DILATION N/A 06/07/2015   Procedure: ESOPHAGEAL DILATION;  Surgeon: Rogene Houston, MD;  [facility];  Service: Endoscopy;  Laterality: N/A;  . ESOPHAGOGASTRODUODENOSCOPY N/A 06/07/2015   Procedure: ESOPHAGOGASTRODUODENOSCOPY (EGD);  Surgeon: Rogene Houston, MD;  [facility];  Service: Endoscopy;  Laterality: N/A;  2:45 - moved to 1:00 - Ann to notify pt  . ESOPHAGOGASTRODUODENOSCOPY (EGD) WITH PROPOFOL N/A 12/31/2012   Procedure: ESOPHAGOGASTRODUODENOSCOPY (EGD) WITH PROPOFOL;  Surgeon: Rogene Houston, MD;  [facility];  Service: Endoscopy;  Laterality:  N/A;  GE junction 37,  . ESOPHAGOGASTRODUODENOSCOPY (EGD) WITH PROPOFOL N/A 07/01/2013   Procedure: ESOPHAGOGASTRODUODENOSCOPY (EGD) WITH PROPOFOL;  Surgeon: Rogene Houston, MD;  [facility];  Service: Endoscopy;  Laterality: N/A;  950  . MALONEY DILATION N/A 12/31/2012   Procedure: MALONEY DILATION;  Surgeon: Rogene Houston, MD;  [facility];  Service: Endoscopy;  Laterality: N/A;  used a # 54,#56  . MALONEY DILATION N/A 07/01/2013   Procedure: Venia Minks DILATION;  Surgeon: Rogene Houston, MD;  Location: [facility];  Service: Endoscopy;  Laterality: N/A;  54/58; no heme present   Past Medical History:  Diagnosis Date  . Anxiety   . Anxiety and depression   . Arthritis   . CAD (coronary artery disease)    Stent placement circumflex coronary 2007, catheterization 2008 patent stents. Normal LV function  . Chest pain   . CHF (congestive heart failure) ([place])   . COPD (chronic obstructive pulmonary disease) ([place])   . Depression   . Diabetes mellitus    Insulin dependent  . Diabetic polyneuropathy (HCC)     severe on multiple medications  . Dyslipidemia   . GERD (gastroesophageal reflux disease)   . Headache(784.0)   . 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 MI (myocardial infarction)    2007  . Obstructive sleep apnea    CPAP machine at night  . Palpitations   . Tachycardia    BP 116/69   Pulse 85   Resp 14   SpO2 93%   Opioid Risk Score:   Fall Risk Score:  `1  Depression screen PHQ 2/9  Depression screen [facility] 2/9 10/11/2015 07/12/2015 07/12/2015 05/14/2015 05/14/2015 05/02/2015 12/11/2014  Decreased Interest 0 0 0 0 0 0 3  Down, Depressed, Hopeless 0 0 0 0 0 0 3  PHQ - 2 Score 0 0 0 0 0 0 6  Altered sleeping - - - - - - -  Tired, decreased energy - - - - - - -  Change in appetite - - - - - - -  Feeling bad or failure about yourself  - - - - - - -  Trouble concentrating - - - - - - -  Moving slowly or fidgety/restless - - - - - - -  Suicidal thoughts - - - - - - -  PHQ-9 Score - - -  - - - -  Some recent data might be hidden   Review of Systems  Constitutional: Positive for appetite change, chills and fever.       Night sweats  HENT: Negative.   Eyes: Negative.   Respiratory: Positive for apnea, cough and wheezing.   Cardiovascular: Negative.   Gastrointestinal: Positive for abdominal pain, diarrhea, nausea and vomiting.  Endocrine:       High blood sugar Low blood sugar  Genitourinary: Negative.   Musculoskeletal:  Limb swelling  Skin: Negative.   Allergic/Immunologic: Negative.   Neurological: Negative.   Hematological: Negative.   Psychiatric/Behavioral: Negative.   All other systems reviewed and are negative.      Objective:   Physical Exam  Constitutional: She is oriented to person, place, and time. She appears well-developed and well-nourished.  HENT:  Head: Normocephalic and atraumatic.  Neck: Normal range of motion. Neck supple.  Cardiovascular: Normal rate and regular rhythm.   Pulmonary/Chest: Effort normal and breath sounds normal.  Musculoskeletal:  Normal Muscle Bulk and Muscle Testing Reveals: Upper Extremities: Full ROM and Muscle Strength 5/5 Thoracic Hyper-Sensitivity: T-1-3 T-7-T-9 Lumbar Hypersensitivity Lower Extremities: Right: Decreased ROM and Muscle Strength 4/5 Right Lower Extremity Flexion Produces Pain into Lumbar and Right Hip Left: Full ROM and Muscle Strength 5/5 Left lower extremity flexion produces pain into left hip Arises from Table Slowly using walker for support Antalgic gait   Neurological: She is alert and oriented to person, place, and time.  Skin: Skin is warm and dry.  Psychiatric: She has a normal mood and affect.  Nursing note and vitals reviewed.         Assessment & Plan:  1.Lumbar pain lumbar spondylosis: Encouraged to increase Activity as tolerated and continue HEP as tolerated. Refilled: MS Contin 30 MG one tablet every 8 hours #90  and  Tramadol 50 mg one tablet every 6 hours as  needed #120 We will continue the opioid monitoring program, this consists of regular clinic visits, examinations, urine drug screen, pill counts as well as use of [place] Controlled Substance Reporting System. 2. Bilateral Knee Pain/ Degenerative: Continue Voltaren Gel. Continue to Monitor 3. Severe diabetic poly neuropathy: Continue Gabapentin. 4. Insomnia: On Trazodone PCP Following  5. Anxiety: Reviewed Dr. Letta Pate Note:  Xanax 1 mg TID this month, will start the weaning process, she verbalizes understanding.   30 minutes of face to face patient care time was spent during this visit. All questions were encouraged and answered.   F/U in 1 month

## 2016-04-15 ENCOUNTER — Other Ambulatory Visit: Payer: Self-pay | Admitting: "Endocrinology

## 2016-04-16 LAB — COMPREHENSIVE METABOLIC PANEL
ALT: 9 IU/L (ref 0–32)
AST: 10 IU/L (ref 0–40)
Albumin/Globulin Ratio: 1.7 (ref 1.2–2.2)
Albumin: 3.5 g/dL — ABNORMAL LOW (ref 3.6–4.8)
Alkaline Phosphatase: 73 IU/L (ref 39–117)
BUN/Creatinine Ratio: 19 (ref 12–28)
BUN: 14 mg/dL (ref 8–27)
Bilirubin Total: 0.3 mg/dL (ref 0.0–1.2)
CO2: 22 mmol/L (ref 18–29)
Calcium: 9 mg/dL (ref 8.7–10.3)
Chloride: 102 mmol/L (ref 96–106)
Creatinine, Ser: 0.73 mg/dL (ref 0.57–1.00)
GFR calc Af Amer: 101 mL/min/{1.73_m2} (ref >59)
GFR calc non Af Amer: 88 mL/min/{1.73_m2} (ref >59)
Globulin, Total: 2.1 g/dL (ref 1.5–4.5)
Glucose: 166 mg/dL — ABNORMAL HIGH (ref 65–99)
Potassium: 4.4 mmol/L (ref 3.5–5.2)
Sodium: 142 mmol/L (ref 134–144)
Total Protein: 5.6 g/dL — ABNORMAL LOW (ref 6.0–8.5)

## 2016-04-16 LAB — LIPID PANEL W/O CHOL/HDL RATIO
Cholesterol, Total: 184 mg/dL (ref 100–199)
HDL: 77 mg/dL (ref >39)
LDL Calculated: 50 mg/dL (ref 0–99)
Triglycerides: 284 mg/dL — ABNORMAL HIGH (ref 0–149)
VLDL Cholesterol Cal: 57 mg/dL — ABNORMAL HIGH (ref 5–40)

## 2016-04-16 LAB — TSH: TSH: 3.07 u[IU]/mL (ref 0.450–4.500)

## 2016-04-16 LAB — VITAMIN D 25 HYDROXY (VIT D DEFICIENCY, FRACTURES): Vit D, 25-Hydroxy: 17 ng/mL — ABNORMAL LOW (ref 30.0–100.0)

## 2016-04-16 LAB — MICROALBUMIN / CREATININE URINE RATIO
Creatinine, Urine: 59.5 mg/dL
Microalb/Creat Ratio: <5 mg/g creat (ref 0.0–30.0)
Microalbumin, Urine: <3 ug/mL

## 2016-04-16 LAB — T4, FREE: Free T4: 1.31 ng/dL (ref 0.82–1.77)

## 2016-04-16 LAB — HGB A1C W/O EAG: Hgb A1c MFr Bld: 7.6 % — ABNORMAL HIGH (ref 4.8–5.6)

## 2016-04-22 ENCOUNTER — Ambulatory Visit: Payer: Medicaid Other | Admitting: "Endocrinology

## 2016-04-25 ENCOUNTER — Other Ambulatory Visit: Payer: Self-pay | Admitting: "Endocrinology

## 2016-05-05 ENCOUNTER — Encounter: Payer: Medicaid Other | Attending: Physical Medicine & Rehabilitation | Admitting: Registered Nurse

## 2016-05-05 ENCOUNTER — Telehealth: Payer: Self-pay | Admitting: Registered Nurse

## 2016-05-05 ENCOUNTER — Encounter: Payer: Self-pay | Admitting: Registered Nurse

## 2016-05-05 VITALS — BP 113/69 | HR 73

## 2016-05-05 DIAGNOSIS — F418 Other specified anxiety disorders: Secondary | ICD-10-CM | POA: Insufficient documentation

## 2016-05-05 DIAGNOSIS — E785 Hyperlipidemia, unspecified: Secondary | ICD-10-CM | POA: Diagnosis not present

## 2016-05-05 DIAGNOSIS — Z1401 Asymptomatic hemophilia A carrier: Secondary | ICD-10-CM | POA: Insufficient documentation

## 2016-05-05 DIAGNOSIS — K219 Gastro-esophageal reflux disease without esophagitis: Secondary | ICD-10-CM | POA: Diagnosis not present

## 2016-05-05 DIAGNOSIS — I252 Old myocardial infarction: Secondary | ICD-10-CM | POA: Insufficient documentation

## 2016-05-05 DIAGNOSIS — G8929 Other chronic pain: Secondary | ICD-10-CM | POA: Insufficient documentation

## 2016-05-05 DIAGNOSIS — Z79899 Other long term (current) drug therapy: Secondary | ICD-10-CM | POA: Diagnosis not present

## 2016-05-05 DIAGNOSIS — Z794 Long term (current) use of insulin: Secondary | ICD-10-CM | POA: Insufficient documentation

## 2016-05-05 DIAGNOSIS — Z760 Encounter for issue of repeat prescription: Secondary | ICD-10-CM | POA: Diagnosis not present

## 2016-05-05 DIAGNOSIS — M5489 Other dorsalgia: Secondary | ICD-10-CM | POA: Insufficient documentation

## 2016-05-05 DIAGNOSIS — M5416 Radiculopathy, lumbar region: Secondary | ICD-10-CM | POA: Diagnosis not present

## 2016-05-05 DIAGNOSIS — M1990 Unspecified osteoarthritis, unspecified site: Secondary | ICD-10-CM | POA: Insufficient documentation

## 2016-05-05 DIAGNOSIS — G4700 Insomnia, unspecified: Secondary | ICD-10-CM | POA: Insufficient documentation

## 2016-05-05 DIAGNOSIS — R002 Palpitations: Secondary | ICD-10-CM | POA: Insufficient documentation

## 2016-05-05 DIAGNOSIS — M25561 Pain in right knee: Secondary | ICD-10-CM | POA: Diagnosis not present

## 2016-05-05 DIAGNOSIS — E1140 Type 2 diabetes mellitus with diabetic neuropathy, unspecified: Secondary | ICD-10-CM | POA: Diagnosis not present

## 2016-05-05 DIAGNOSIS — M79604 Pain in right leg: Secondary | ICD-10-CM | POA: Diagnosis not present

## 2016-05-05 DIAGNOSIS — R079 Chest pain, unspecified: Secondary | ICD-10-CM | POA: Diagnosis not present

## 2016-05-05 DIAGNOSIS — R51 Headache: Secondary | ICD-10-CM | POA: Insufficient documentation

## 2016-05-05 DIAGNOSIS — M25562 Pain in left knee: Secondary | ICD-10-CM | POA: Diagnosis not present

## 2016-05-05 DIAGNOSIS — E1142 Type 2 diabetes mellitus with diabetic polyneuropathy: Secondary | ICD-10-CM | POA: Diagnosis not present

## 2016-05-05 DIAGNOSIS — Z5181 Encounter for therapeutic drug level monitoring: Secondary | ICD-10-CM | POA: Diagnosis not present

## 2016-05-05 DIAGNOSIS — I2510 Atherosclerotic heart disease of native coronary artery without angina pectoris: Secondary | ICD-10-CM | POA: Diagnosis not present

## 2016-05-05 DIAGNOSIS — M47896 Other spondylosis, lumbar region: Secondary | ICD-10-CM | POA: Diagnosis not present

## 2016-05-05 DIAGNOSIS — R000 Tachycardia, unspecified: Secondary | ICD-10-CM | POA: Diagnosis not present

## 2016-05-05 DIAGNOSIS — M79605 Pain in left leg: Secondary | ICD-10-CM | POA: Diagnosis not present

## 2016-05-05 DIAGNOSIS — I509 Heart failure, unspecified: Secondary | ICD-10-CM | POA: Diagnosis not present

## 2016-05-05 DIAGNOSIS — G894 Chronic pain syndrome: Secondary | ICD-10-CM

## 2016-05-05 DIAGNOSIS — G4733 Obstructive sleep apnea (adult) (pediatric): Secondary | ICD-10-CM | POA: Diagnosis not present

## 2016-05-05 DIAGNOSIS — M47816 Spondylosis without myelopathy or radiculopathy, lumbar region: Secondary | ICD-10-CM | POA: Diagnosis not present

## 2016-05-05 DIAGNOSIS — J449 Chronic obstructive pulmonary disease, unspecified: Secondary | ICD-10-CM | POA: Insufficient documentation

## 2016-05-05 DIAGNOSIS — F411 Generalized anxiety disorder: Secondary | ICD-10-CM | POA: Diagnosis not present

## 2016-05-05 MED ORDER — MORPHINE SULFATE ER 30 MG PO TBCR: 30.0000 mg | EXTENDED_RELEASE_TABLET | Freq: Three times a day (TID) | ORAL | 0 refills | Status: DC

## 2016-05-05 MED ORDER — ALPRAZOLAM 1 MG PO TABS: 1.0000 mg | ORAL_TABLET | Freq: Three times a day (TID) | ORAL | 0 refills | Status: DC

## 2016-05-05 NOTE — Telephone Encounter (Signed)
On 05/05/2016 the  [place] was reviewed no conflict was seen on the [place] with multiple prescribers. [name] has a signed narcotic contract with our office. If there were any discrepancies this would have been reported to her physician.

## 2016-05-05 NOTE — Progress Notes (Signed)
Subjective:    Patient ID: [name], female    DOB: February 16, 1951, 66 y.o.   MRN: [id number]  HPI:  [name] is a 66 year old female who returns for follow up appointment for chronic pain and medication refill. She states her pain is located in her entire back radiating into bilateral hips andlower extremities laterally, bilateralkneesand bilateral feet. She rates her pain 8.Her current exercise regime is walking short distances with her walker, encouraged to increase her activity, she verbalizes understanding.  [name] is on the [facility], all narcotics have to be prescribed by one provider, letter reviewed.   Pain Inventory Average Pain 8 Pain Right Now 8 My pain is sharp, burning, stabbing, tingling and aching  In the last 24 hours, has pain interfered with the following? General activity 6 Relation with others 7 Enjoyment of life 7 What TIME of day is your pain at its worst? morning Sleep (in general) Poor  Pain is worse with: walking, bending, sitting and standing Pain improves with: heat/ice and medication Relief from Meds: 3  Mobility walk with assistance use a walker ability to climb steps?  no do you drive?  no needs help with transfers  Function retired I need assistance with the following:  dressing, bathing, meal prep, household duties and shopping  Neuro/Psych bladder control problems bowel control problems weakness numbness tingling trouble walking spasms dizziness confusion depression anxiety  Prior Studies Any changes since last visit?  no  Physicians involved in your care Any changes since last visit?  no   Family History  Problem Relation Age of Onset  . Heart attack Mother   . Colon cancer Mother   . Heart attack Father   . Stroke Sister   . Hemophilia Child   . Cancer    . Coronary artery disease    . Cancer Brother   . Cancer Maternal Aunt   . Cancer Maternal Uncle   . Cancer Paternal Aunt   .  Cancer Paternal Uncle   . Cancer Brother    Social History   Social History  . Marital status: Legally Separated    Spouse name: N/A  . Number of children: 4  . Years of education: N/A   Occupational History  . Disabled    Social History Main Topics  . Smoking status: Never Smoker  . Smokeless tobacco: Never Used  . Alcohol use No  . Drug use: No  . Sexual activity: Not on file   Other Topics Concern  . Not on file   Social History Narrative   Patient does not get regular exercise   Denies caffeine use    Past Surgical History:  Procedure Laterality Date  . ABDOMINAL HYSTERECTOMY    . Arm Surgery Bilateral    due to fall-pt broke both forearms  . BIOPSY N/A 07/01/2013   Procedure: BIOPSY;  Surgeon: Rogene Houston, MD;  Location: [facility];  Service: Endoscopy;  Laterality: N/A;  . CHOLECYSTECTOMY    . COLONOSCOPY  08/06/2011   Procedure: COLONOSCOPY;  Surgeon: Rogene Houston, MD;  [facility];  Service: Endoscopy;  Laterality: N/A;  215  . COLONOSCOPY WITH PROPOFOL N/A 12/31/2012   Procedure: COLONOSCOPY WITH PROPOFOL;  Surgeon: Rogene Houston, MD;  Location: [facility];  Service: Endoscopy;  Laterality: N/A;  in cecum at 0814, total withdrawal time 69mn  . CORONARY ANGIOPLASTY WITH STENT PLACEMENT    . ESOPHAGEAL DILATION N/A 06/07/2015  Procedure: ESOPHAGEAL DILATION;  Surgeon: Rogene Houston, MD;  [facility];  Service: Endoscopy;  Laterality: N/A;  . ESOPHAGOGASTRODUODENOSCOPY N/A 06/07/2015   Procedure: ESOPHAGOGASTRODUODENOSCOPY (EGD);  Surgeon: Rogene Houston, MD;  [facility];  Service: Endoscopy;  Laterality: N/A;  2:45 - moved to 1:00 - Ann to notify pt  . ESOPHAGOGASTRODUODENOSCOPY (EGD) WITH PROPOFOL N/A 12/31/2012   Procedure: ESOPHAGOGASTRODUODENOSCOPY (EGD) WITH PROPOFOL;  Surgeon: Rogene Houston, MD;  Location: [facility];  Service: Endoscopy;  Laterality: N/A;  GE junction 37,  . ESOPHAGOGASTRODUODENOSCOPY (EGD) WITH  PROPOFOL N/A 07/01/2013   Procedure: ESOPHAGOGASTRODUODENOSCOPY (EGD) WITH PROPOFOL;  Surgeon: Rogene Houston, MD;  Location: [facility];  Service: Endoscopy;  Laterality: N/A;  950  . MALONEY DILATION N/A 12/31/2012   Procedure: MALONEY DILATION;  Surgeon: Rogene Houston, MD;  Location: [facility];  Service: Endoscopy;  Laterality: N/A;  used a # 54,#56  . MALONEY DILATION N/A 07/01/2013   Procedure: Venia Minks DILATION;  Surgeon: Rogene Houston, MD;  Location: [facility];  Service: Endoscopy;  Laterality: N/A;  54/58; no heme present   Past Medical History:  Diagnosis Date  . Anxiety   . Anxiety and depression   . Arthritis   . CAD (coronary artery disease)    Stent placement circumflex coronary 2007, catheterization 2008 patent stents. Normal LV function  . Chest pain   . CHF (congestive heart failure) ([place])   . COPD (chronic obstructive pulmonary disease) ([place])   . Depression   . Diabetes mellitus    Insulin dependent  . Diabetic polyneuropathy (HCC)     severe on multiple medications  . Dyslipidemia   . GERD (gastroesophageal reflux disease)   . Headache(784.0)   . 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 MI (myocardial infarction)    2007  . Obstructive sleep apnea    CPAP machine at night  . Palpitations   . Tachycardia    There were no vitals taken for this visit.  Opioid Risk Score:   Fall Risk Score:  `1  Depression screen PHQ 2/9  Depression screen [facility] 2/9 10/11/2015 07/12/2015 07/12/2015 05/14/2015 05/14/2015 05/02/2015 12/11/2014  Decreased Interest 0 0 0 0 0 0 3  Down, Depressed, Hopeless 0 0 0 0 0 0 3  PHQ - 2 Score 0 0 0 0 0 0 6  Altered sleeping - - - - - - -  Tired, decreased energy - - - - - - -  Change in appetite - - - - - - -  Feeling bad or failure about yourself  - - - - - - -  Trouble concentrating - - - - - - -  Moving slowly or fidgety/restless - - - - - - -  Suicidal thoughts - - - - - - -  PHQ-9 Score - - - - - - -  Some recent data might be hidden    Review of Systems    Constitutional: Negative.   HENT: Negative.   Eyes: Negative.   Respiratory: Positive for apnea, shortness of breath and wheezing.   Gastrointestinal: Positive for abdominal pain and nausea.  Endocrine: Negative.   Genitourinary: Positive for dysuria.  Musculoskeletal: Negative.   Allergic/Immunologic: Negative.   Hematological: Negative.   Psychiatric/Behavioral: Negative.   All other systems reviewed and are negative.      Objective:   Physical Exam  Constitutional: She is oriented to person, place, and time. She appears well-developed and well-nourished.  HENT:  Head:  Normocephalic and atraumatic.  Neck: Normal range of motion. Neck supple.  Cervical Paraspinal Tenderness: C-5-C-6  Cardiovascular: Normal rate and regular rhythm.   Pulmonary/Chest: Effort normal and breath sounds normal.  Musculoskeletal:  Normal Muscle Bulk and Muscle Testing Reveals: Upper Extremities: Full ROM and Muscle Strength 5/5 Thoracic and Lumbar Hypersensitivity Bilateral Greater Trochanteric Tenderness Lower Extremities: Full ROM and Muscle Strength 5/5 Arises from Table with ease using walker for support Narrow Based Gait  Neurological: She is alert and oriented to person, place, and time.  Skin: Skin is warm and dry.  Psychiatric: She has a normal mood and affect.  Nursing note and vitals reviewed.         Assessment & Plan:  1.Lumbar pain lumbar spondylosis: Encouraged to increase Activity as tolerated. 05/05/2016 Refilled: MS Contin 30 MG one tablet every 8 hours #90 and Continue Tramadol 50 mg one tablet every 6 hours as needed #120. 05/05/2016 We will continue the opioid monitoring program, this consists of regular clinic visits, examinations, urine drug screen, pill counts as well as use of [place] Controlled Substance Reporting System. 2. Bilateral Knee Pain/ Degenerative: Continue Voltaren Gel. Continue to Monitor. 05/05/2016. 3. Severe diabetic poly neuropathy:  Continue Gabapentin. 05/05/2016 4. Insomnia: On Trazodone PCP Following  5. Anxiety: Reviewed Dr. Letta Pate Note:  Xanax 1 mg TID this month, will start the weaning process, she verbalizes understanding. Ms. Karnes was encouraged to start the weaning process of Xanax, she verbalizes understanding.   20 minutes of face to face patient care time was spent during this visit. All questions were encouraged and answered.   F/U in 1 month

## 2016-05-14 ENCOUNTER — Encounter: Payer: Self-pay | Admitting: "Endocrinology

## 2016-05-14 ENCOUNTER — Ambulatory Visit (INDEPENDENT_AMBULATORY_CARE_PROVIDER_SITE_OTHER): Payer: Medicaid Other | Admitting: "Endocrinology

## 2016-05-14 VITALS — BP 134/71 | HR 78 | Wt 228.0 lb

## 2016-05-14 DIAGNOSIS — E1159 Type 2 diabetes mellitus with other circulatory complications: Secondary | ICD-10-CM

## 2016-05-14 DIAGNOSIS — E782 Mixed hyperlipidemia: Secondary | ICD-10-CM | POA: Diagnosis not present

## 2016-05-14 DIAGNOSIS — I10 Essential (primary) hypertension: Secondary | ICD-10-CM

## 2016-05-14 NOTE — Patient Instructions (Signed)

## 2016-05-14 NOTE — Progress Notes (Signed)
Subjective:    Patient ID: [name], female    DOB: Dec 04, 1950. Patient is being seen in f/u for management of diabetes.  Past Medical History:  Diagnosis Date  . Anxiety   . Anxiety and depression   . Arthritis   . CAD (coronary artery disease)    Stent placement circumflex coronary 2007, catheterization 2008 patent stents. Normal LV function  . Chest pain   . CHF (congestive heart failure) ([place])   . COPD (chronic obstructive pulmonary disease) ([place])   . Depression   . Diabetes mellitus    Insulin dependent  . Diabetic polyneuropathy (HCC)     severe on multiple medications  . Dyslipidemia   . GERD (gastroesophageal reflux disease)   . Headache(784.0)   . 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 MI (myocardial infarction)    2007  . Obstructive sleep apnea    CPAP machine at night  . Palpitations   . Tachycardia    Past Surgical History:  Procedure Laterality Date  . ABDOMINAL HYSTERECTOMY    . Arm Surgery Bilateral    due to fall-pt broke both forearms  . BIOPSY N/A 07/01/2013   Procedure: BIOPSY;  Surgeon: Rogene Houston, MD;  [facility];  Service: Endoscopy;  Laterality: N/A;  . CHOLECYSTECTOMY    . COLONOSCOPY  08/06/2011   Procedure: COLONOSCOPY;  Surgeon: Rogene Houston, MD;  [facility];  Service: Endoscopy;  Laterality: N/A;  215  . COLONOSCOPY WITH PROPOFOL N/A 12/31/2012   Procedure: COLONOSCOPY WITH PROPOFOL;  Surgeon: Rogene Houston, MD;  Location: [facility];  Service: Endoscopy;  Laterality: N/A;  in cecum at 0814, total withdrawal time 63min  . CORONARY ANGIOPLASTY WITH STENT PLACEMENT    . ESOPHAGEAL DILATION N/A 06/07/2015   Procedure: ESOPHAGEAL DILATION;  Surgeon: Rogene Houston, MD;  [facility];  Service: Endoscopy;  Laterality: N/A;  . ESOPHAGOGASTRODUODENOSCOPY N/A 06/07/2015   Procedure: ESOPHAGOGASTRODUODENOSCOPY (EGD);  Surgeon: Rogene Houston, MD;  [facility];  Service: Endoscopy;  Laterality: N/A;  2:45 -  moved to 1:00 - Ann to notify pt  . ESOPHAGOGASTRODUODENOSCOPY (EGD) WITH PROPOFOL N/A 12/31/2012   Procedure: ESOPHAGOGASTRODUODENOSCOPY (EGD) WITH PROPOFOL;  Surgeon: Rogene Houston, MD;  [facility];  Service: Endoscopy;  Laterality: N/A;  GE junction 37,  . ESOPHAGOGASTRODUODENOSCOPY (EGD) WITH PROPOFOL N/A 07/01/2013   Procedure: ESOPHAGOGASTRODUODENOSCOPY (EGD) WITH PROPOFOL;  Surgeon: Rogene Houston, MD;  Location[facility];  Service: Endoscopy;  Laterality: N/A;  950  . MALONEY DILATION N/A 12/31/2012   Procedure: MALONEY DILATION;  Surgeon: Rogene Houston, MD;  Location: [facility];  Service: Endoscopy;  Laterality: N/A;  used a # 54,#56  . MALONEY DILATION N/A 07/01/2013   Procedure: Venia Minks DILATION;  Surgeon: Rogene Houston, MD;  Location: [facility];  Service: Endoscopy;  Laterality: N/A;  54/58; no heme present   Social History   Social History  . Marital status: Legally Separated    Spouse name: N/A  . Number of children: 4  . Years of education: N/A   Occupational History  . Disabled    Social History Main Topics  . Smoking status: Never Smoker  . Smokeless tobacco: Never Used  . Alcohol use No  . Drug use: No  . Sexual activity: Not Asked   Other Topics Concern  . None   Social History Narrative   Patient does not get regular exercise   Denies caffeine use  Outpatient Encounter Prescriptions as of 05/14/2016  Medication Sig  . ALPRAZolam (XANAX) 1 MG tablet Take 1 tablet (1 mg total) by mouth 3 (three) times daily.  Marland Kitchen aspirin EC 81 MG tablet Take 81 mg by mouth daily.  Marland Kitchen atorvastatin (LIPITOR) 40 MG tablet Take 40 mg by mouth daily.  . beclomethasone (QVAR) 40 MCG/ACT inhaler Inhale 1 puff into the lungs 2 (two) times daily.  Marland Kitchen dicyclomine (BENTYL) 10 MG capsule TAKE 1 CAPSULE BY MOUTH TWICE DAILY AS NEEDED FOR SPASMS.  Marland Kitchen estrogens, conjugated, (PREMARIN) 1.25 MG tablet Take 1.25 mg by mouth daily.   . fluconazole (DIFLUCAN) 150 MG tablet Take 1 tablet (150  mg total) by mouth once.  . fluticasone-salmeterol (ADVAIR HFA) 115-21 MCG/ACT inhaler Inhale 2 puffs into the lungs 2 (two) times daily.  Marland Kitchen gabapentin (NEURONTIN) 100 MG capsule Take 1 capsule (100 mg total) by mouth 3 (three) times daily.  Marland Kitchen GLOBAL EASE INJECT PEN NEEDLES 31G X 8 MM MISC USE AS DIRECTED FOUR TIMES DAILY  . glucose blood (ACCU-CHEK AVIVA) test strip Use to test 4 times a day  . ibuprofen (ADVIL,MOTRIN) 200 MG tablet Take 200 mg by mouth daily as needed for headache.  . Insulin Glargine (LANTUS SOLOSTAR [place]) Inject 40 Units into the skin at bedtime.  . INVOKANA 100 MG TABS tablet TAKE 1 TABLET BY MOUTH DAILY BEFORE BREAKFAST  . LANTUS SOLOSTAR 100 UNIT/ML Solostar Pen INJECT 50 UNITS INTO THE SKIN EVERY DAY WITH BREAKFAST  . lidocaine (XYLOCAINE) 5 % ointment Apply 1 application topically at bedtime.   . metFORMIN (GLUCOPHAGE) 500 MG tablet TAKE ONE TABLET BY MOUTH TWICE DAILY WITH A MEAL  . morphine (MS CONTIN) 30 MG 12 hr tablet Take 1 tablet (30 mg total) by mouth every 8 (eight) hours.  . nitroGLYCERIN (NITROSTAT) 0.4 MG SL tablet Place 1 tablet (0.4 mg total) under the tongue every 5 (five) minutes x 3 doses as needed. For chest pains  . ondansetron (ZOFRAN) 4 MG tablet Take 4 mg by mouth every 6 (six) hours as needed for nausea or vomiting.  . pantoprazole (PROTONIX) 40 MG tablet TAKE 1 TABLET BY MOUTH TWICE DAILY BEFORE MEALS  . potassium chloride (K-DUR,KLOR-CON) 10 MEQ tablet TAKE 2 TABLETS BY MOUTH 2 TIMES DAILY.  Marland Kitchen psyllium (METAMUCIL SMOOTH TEXTURE) 28 % packet Take 1 packet by mouth at bedtime.  . ranitidine (ZANTAC) 300 MG tablet Take 300 mg by mouth 2 (two) times daily.  . TOPROL XL 50 MG 24 hr tablet TAKE 1 TABLET BY MOUTH DAILY WITH OR IMMEDIATELY FOLLOWING A MEAL.  Marland Kitchen traMADol (ULTRAM) 50 MG tablet Take 1 tablet (50 mg total) by mouth every 6 (six) hours as needed.  . traZODone (DESYREL) 25 mg TABS tablet Take 0.5 tablets (25 mg total) by mouth at bedtime.  (Patient taking differently: Take 50 mg by mouth at bedtime. )  . VOLTAREN 1 % GEL APPLY 2 GRAMS ON BOTH KNEES THREE TIMES DAILY (ONLY FILL IF SHE ASKS)   No facility-administered encounter medications on file as of 05/14/2016.    ALLERGIES: Allergies  Allergen Reactions  . Nitrofuran Derivatives Itching and Swelling  . Amphetamine-Dextroamphet Er Swelling  . Pregabalin Swelling  . Topiramate Other (See Comments)    Tongue tingle  . Verelan [Verapamil] Rash   VACCINATION STATUS:  There is no immunization history on file for this patient.  Diabetes  She presents for her follow-up diabetic visit. She has type 2 diabetes mellitus. Onset time:  She was diagnosed at approximate age of 3 years. Her disease course has been improving. There are no hypoglycemic associated symptoms. Pertinent negatives for hypoglycemia include no confusion, headaches, pallor or seizures. Associated symptoms include fatigue. Pertinent negatives for diabetes include no chest pain, no polydipsia, no polyphagia and no polyuria. There are no hypoglycemic complications. Symptoms are improving. Diabetic complications include autonomic neuropathy, heart disease, peripheral neuropathy and retinopathy. Risk factors for coronary artery disease include dyslipidemia, diabetes mellitus, obesity, hypertension and sedentary lifestyle. Current diabetic treatments: Lantus 65 units daily at bedtime, Humalog 15 units 3 times a day before meals. She mentions allergy to Victoza, Byetta, and metformin. Her weight is decreasing steadily (She lost 29 pounds overall.). She is following a generally unhealthy diet. When asked about meal planning, she reported none. She has not had a previous visit with a dietitian. She never participates in exercise. Home blood sugar record trend: She did not bring her logs however her meter shows average of 1 50 g/dL for the last 7 days. Her breakfast blood glucose range is generally 140-180 mg/dl. Her overall blood  glucose range is 140-180 mg/dl. An ACE inhibitor/angiotensin II receptor blocker is not being taken. Eye exam is current.  Hyperlipidemia  This is a chronic problem. The current episode started more than 1 year ago. The problem is uncontrolled. Recent lipid tests were reviewed and are high. Exacerbating diseases include diabetes and obesity. Pertinent negatives include no chest pain, myalgias or shortness of breath. Current antihyperlipidemic treatment includes statins. Risk factors for coronary artery disease include diabetes mellitus, dyslipidemia, hypertension, obesity and a sedentary lifestyle.  Hypertension  This is a chronic problem. The current episode started more than 1 year ago. Pertinent negatives include no chest pain, headaches, palpitations or shortness of breath. Risk factors for coronary artery disease include diabetes mellitus, dyslipidemia, obesity and sedentary lifestyle. Hypertensive end-organ damage includes CAD/MI and retinopathy.    Review of Systems  Constitutional: Positive for fatigue. Negative for chills, fever and unexpected weight change.  HENT: Negative for trouble swallowing and voice change.   Eyes: Negative for visual disturbance.  Respiratory: Negative for cough, shortness of breath and wheezing.   Cardiovascular: Negative for chest pain, palpitations and leg swelling.  Gastrointestinal: Negative for diarrhea, nausea and vomiting.  Endocrine: Negative for cold intolerance, heat intolerance, polydipsia, polyphagia and polyuria.  Musculoskeletal: Negative for arthralgias and myalgias.  Skin: Negative for color change, pallor, rash and wound.  Neurological: Negative for seizures and headaches.  Psychiatric/Behavioral: Negative for confusion and suicidal ideas.    Objective:    BP 134/71   Pulse 78   Wt 228 lb (103.4 kg)   BMI 41.70 kg/m   Wt Readings from Last 3 Encounters:  05/14/16 228 lb (103.4 kg)  01/16/16 230 lb (104.3 kg)  01/16/16 230 lb (104.3  kg)    Physical Exam  Constitutional: She is oriented to person, place, and time. She appears well-developed.  HENT:  Head: Normocephalic and atraumatic.  She has very poor dentition.  Eyes: EOM are normal.  Neck: Normal range of motion. Neck supple. No tracheal deviation present. No thyromegaly present.  Cardiovascular: Normal rate and regular rhythm.   Pulmonary/Chest: Effort normal and breath sounds normal.  Abdominal: Soft. Bowel sounds are normal. There is no tenderness. There is no guarding.  Musculoskeletal: Normal range of motion. She exhibits no edema.  Neurological: She is alert and oriented to person, place, and time. She has normal reflexes. No cranial nerve deficit. Coordination normal.  Skin: Skin is warm and dry. No rash noted. No erythema. No pallor.  Psychiatric: She has a normal mood and affect. Judgment normal.    CMP     Component Value Date/Time   NA 142 04/15/2016 1121   K 4.4 04/15/2016 1121   CL 102 04/15/2016 1121   CO2 22 04/15/2016 1121   GLUCOSE 166 (H) 04/15/2016 1121   GLUCOSE 114 (H) 01/08/2016 1529   BUN 14 04/15/2016 1121   CREATININE 0.73 04/15/2016 1121   CREATININE 0.68 01/08/2016 1529   CALCIUM 9.0 04/15/2016 1121   PROT 5.6 (L) 04/15/2016 1121   ALBUMIN 3.5 (L) 04/15/2016 1121   AST 10 04/15/2016 1121   ALT 9 04/15/2016 1121   ALKPHOS 73 04/15/2016 1121   BILITOT 0.3 04/15/2016 1121   GFRNONAA 88 04/15/2016 1121   GFRNONAA >89 01/08/2016 1529   GFRAA 101 04/15/2016 1121   GFRAA >89 01/08/2016 1529   Diabetic Labs (most recent): Lab Results  Component Value Date   HGBA1C 7.6 (H) 04/15/2016   HGBA1C 8.0 (H) 01/08/2016   HGBA1C 7.8 (H) 10/04/2015    Lipid Panel     Component Value Date/Time   CHOL 184 04/15/2016 1121   TRIG 284 (H) 04/15/2016 1121   HDL 77 04/15/2016 1121   CHOLHDL 2.7 07/05/2015 0937   VLDL 53 (H) 07/05/2015 0937   LDLCALC 50 04/15/2016 1121     Assessment & Plan:   1. Type 2 diabetes mellitus with  vascular disease ([place])  - Patient has currently uncontrolled symptomatic type 2 DM since  66 years of age,  with most recent A1c Improving to 7.6%,  generally improving from 8.6 %. Recent labs reviewed. -She came with controlled  fasting blood glucose profile mainly .   Her diabetes is complicated by coronary artery disease status post stent placement and patient remains at a high risk for more acute and chronic complications of diabetes which include CAD, CVA, CKD, retinopathy, and neuropathy. These are all discussed in detail with the patient.  - I have counseled the patient on diet management and weight loss, by adopting a carbohydrate restricted/protein rich diet. - She came with overall weight loss of 25 pounds since February 2017. - Suggestion is made for patient to avoid simple carbohydrates   from their diet including Cakes , Desserts, Ice Cream,  Soda (  diet and regular) , Sweet Tea , Candies,  Chips, Cookies, Artificial Sweeteners,   and "Sugar-free" Products . This will help patient to have stable blood glucose profile and potentially avoid unintended weight gain.  - I encouraged the patient to switch to  unprocessed or minimally processed complex starch and increased protein intake (animal or plant source), fruits, and vegetables.  - Patient is advised to stick to a routine mealtimes to eat 3 meals  a day and avoid unnecessary snacks ( to snack only to correct hypoglycemia).  - The patient will be scheduled with Jearld Fenton, RDN, CDE for individualized DM education.  - I have approached patient with the following individualized plan to manage diabetes and patient agrees:   - She has normal renal function. -I will proceed  with Lantus  40 units daily at bedtime and  continue to hold off prandial insulin Humalog for now .    - I advised her to continue monitoring of blood glucose before meals breakfast and at bedtime.  - Patient is warned not to take insulin without proper  monitoring per orders. -Adjustment parameters are given  for hypo and hyperglycemia in writing. -Patient is encouraged to call clinic for blood glucose levels less than 70 or above 200 mg /dl. -She mentions allergy to a number of use for medications unfortunately, Victoza, Byetta. - She is  tolerating  metformin , I will continue  metformin 500 mg by mouth twice a day. -She will continue Invokana 100 mg by mouth every morning. -Side effects and precautions discussed with hershe was treated for yeast infection with Diflucan once after her last visit. - Patient specific target  A1c;  LDL, HDL, Triglycerides, and  Waist Circumference were discussed in detail.  2) BP/HTN: Controlled. Continue current medications . 3) Lipids/HPL:  Uncontrolled with triglycerides of 267 improving from  902, continue  Lipitor 40 mg by mouth daily at bedtime. Better control of diabetes will help for triglycerides. 4)  Weight/Diet: CDE Consult in progress  , exercise, and detailed carbohydrates information provided.  5) Chronic Care/Health Maintenance:  -Patient is on Statin medications and encouraged to continue to follow up with Ophthalmology, Podiatrist at least yearly or according to recommendations, and advised to   stay away from smoking. I have recommended yearly flu vaccine and pneumonia vaccination at least every 5 years; moderate intensity exercise for up to 150 minutes weekly; and  sleep for at least 7 hours a day.  - 30 minutes of time was spent on the care of this patient , 50% of which was applied for counseling on diabetes complications and their preventions.  - Patient to bring meter and  blood glucose logs during their next visit.   - I advised patient to maintain close follow up with Glenda Chroman, MD for primary care needs.  Follow up plan: - Return in about 3 months (around 08/11/2016) for follow up with pre-visit labs, meter, and logs.  Glade Lloyd, MD Phone: [phone]  Fax: [phone]    05/14/2016, 11:42 AM

## 2016-05-26 ENCOUNTER — Other Ambulatory Visit: Payer: Self-pay | Admitting: "Endocrinology

## 2016-05-27 ENCOUNTER — Other Ambulatory Visit: Payer: Self-pay

## 2016-05-27 MED ORDER — INSULIN GLARGINE 100 UNIT/ML SOLOSTAR PEN: 50.0000 [IU] | PEN_INJECTOR | Freq: Every day | SUBCUTANEOUS | 3 refills | Status: DC

## 2016-06-02 ENCOUNTER — Encounter: Payer: Medicaid Other | Attending: Physical Medicine & Rehabilitation | Admitting: Registered Nurse

## 2016-06-02 ENCOUNTER — Encounter: Payer: Self-pay | Admitting: Registered Nurse

## 2016-06-02 VITALS — BP 121/80 | HR 79

## 2016-06-02 DIAGNOSIS — M5416 Radiculopathy, lumbar region: Secondary | ICD-10-CM | POA: Diagnosis not present

## 2016-06-02 DIAGNOSIS — M5489 Other dorsalgia: Secondary | ICD-10-CM | POA: Insufficient documentation

## 2016-06-02 DIAGNOSIS — Z5181 Encounter for therapeutic drug level monitoring: Secondary | ICD-10-CM

## 2016-06-02 DIAGNOSIS — M1990 Unspecified osteoarthritis, unspecified site: Secondary | ICD-10-CM | POA: Diagnosis not present

## 2016-06-02 DIAGNOSIS — Z79899 Other long term (current) drug therapy: Secondary | ICD-10-CM | POA: Diagnosis not present

## 2016-06-02 DIAGNOSIS — R079 Chest pain, unspecified: Secondary | ICD-10-CM | POA: Insufficient documentation

## 2016-06-02 DIAGNOSIS — G894 Chronic pain syndrome: Secondary | ICD-10-CM | POA: Diagnosis not present

## 2016-06-02 DIAGNOSIS — E785 Hyperlipidemia, unspecified: Secondary | ICD-10-CM | POA: Diagnosis not present

## 2016-06-02 DIAGNOSIS — M47896 Other spondylosis, lumbar region: Secondary | ICD-10-CM | POA: Diagnosis not present

## 2016-06-02 DIAGNOSIS — M25561 Pain in right knee: Secondary | ICD-10-CM | POA: Diagnosis not present

## 2016-06-02 DIAGNOSIS — G4733 Obstructive sleep apnea (adult) (pediatric): Secondary | ICD-10-CM | POA: Diagnosis not present

## 2016-06-02 DIAGNOSIS — K219 Gastro-esophageal reflux disease without esophagitis: Secondary | ICD-10-CM | POA: Diagnosis not present

## 2016-06-02 DIAGNOSIS — M25562 Pain in left knee: Secondary | ICD-10-CM

## 2016-06-02 DIAGNOSIS — R002 Palpitations: Secondary | ICD-10-CM | POA: Diagnosis not present

## 2016-06-02 DIAGNOSIS — I509 Heart failure, unspecified: Secondary | ICD-10-CM | POA: Diagnosis not present

## 2016-06-02 DIAGNOSIS — Z794 Long term (current) use of insulin: Secondary | ICD-10-CM | POA: Diagnosis not present

## 2016-06-02 DIAGNOSIS — G4700 Insomnia, unspecified: Secondary | ICD-10-CM | POA: Insufficient documentation

## 2016-06-02 DIAGNOSIS — F418 Other specified anxiety disorders: Secondary | ICD-10-CM | POA: Diagnosis not present

## 2016-06-02 DIAGNOSIS — Z760 Encounter for issue of repeat prescription: Secondary | ICD-10-CM | POA: Diagnosis not present

## 2016-06-02 DIAGNOSIS — J449 Chronic obstructive pulmonary disease, unspecified: Secondary | ICD-10-CM | POA: Insufficient documentation

## 2016-06-02 DIAGNOSIS — I252 Old myocardial infarction: Secondary | ICD-10-CM | POA: Diagnosis not present

## 2016-06-02 DIAGNOSIS — M79604 Pain in right leg: Secondary | ICD-10-CM | POA: Insufficient documentation

## 2016-06-02 DIAGNOSIS — Z1401 Asymptomatic hemophilia A carrier: Secondary | ICD-10-CM | POA: Diagnosis not present

## 2016-06-02 DIAGNOSIS — E1140 Type 2 diabetes mellitus with diabetic neuropathy, unspecified: Secondary | ICD-10-CM | POA: Diagnosis not present

## 2016-06-02 DIAGNOSIS — M79605 Pain in left leg: Secondary | ICD-10-CM | POA: Insufficient documentation

## 2016-06-02 DIAGNOSIS — R51 Headache: Secondary | ICD-10-CM | POA: Diagnosis not present

## 2016-06-02 DIAGNOSIS — I2510 Atherosclerotic heart disease of native coronary artery without angina pectoris: Secondary | ICD-10-CM | POA: Insufficient documentation

## 2016-06-02 DIAGNOSIS — R000 Tachycardia, unspecified: Secondary | ICD-10-CM | POA: Insufficient documentation

## 2016-06-02 DIAGNOSIS — F3289 Other specified depressive episodes: Secondary | ICD-10-CM

## 2016-06-02 DIAGNOSIS — M47816 Spondylosis without myelopathy or radiculopathy, lumbar region: Secondary | ICD-10-CM | POA: Diagnosis not present

## 2016-06-02 DIAGNOSIS — G8929 Other chronic pain: Secondary | ICD-10-CM

## 2016-06-02 DIAGNOSIS — F411 Generalized anxiety disorder: Secondary | ICD-10-CM | POA: Diagnosis not present

## 2016-06-02 DIAGNOSIS — E1142 Type 2 diabetes mellitus with diabetic polyneuropathy: Secondary | ICD-10-CM | POA: Insufficient documentation

## 2016-06-02 MED ORDER — ALPRAZOLAM 1 MG PO TABS: 1.0000 mg | ORAL_TABLET | Freq: Three times a day (TID) | ORAL | 0 refills | Status: DC | PRN

## 2016-06-02 MED ORDER — MORPHINE SULFATE ER 30 MG PO TBCR: 30.0000 mg | EXTENDED_RELEASE_TABLET | Freq: Three times a day (TID) | ORAL | 0 refills | Status: DC

## 2016-06-02 NOTE — Progress Notes (Signed)
Subjective:    Patient ID: [name], female    DOB: 1950-06-08, 66 y.o.   MRN: [id number]  HPI: [name] is a 66year old female who returns for follow up appointmentfor chronic pain and medication refill. She states her pain is located in her lower back radiating into her bilateral lower extremities and bilateralkneesand bilateral feet. She rates her pain 7. Her current exercise regime is performing chair exercises and walking short distances with her walker.   Pain Inventory Average Pain 8 Pain Right Now 7 My pain is constant, burning, dull, stabbing, tingling and aching  In the last 24 hours, has pain interfered with the following? General activity 7 Relation with others 8 Enjoyment of life 8 What TIME of day is your pain at its worst? morning and night Sleep (in general) Poor  Pain is worse with: walking, bending, sitting, inactivity, standing and some activites Pain improves with: rest, heat/ice and medication Relief from Meds: 3  Mobility use a cane use a walker how many minutes can you walk? 5 ability to climb steps?  no do you drive?  no Do you have any goals in this area?  no  Function disabled: date disabled . I need assistance with the following:  dressing, bathing, meal prep, household duties and shopping Do you have any goals in this area?  no  Neuro/Psych bladder control problems bowel control problems weakness numbness tingling spasms dizziness anxiety  Prior Studies Any changes since last visit?  no  Physicians involved in your care Any changes since last visit?  no   Family History  Problem Relation Age of Onset  . Heart attack Mother   . Colon cancer Mother   . Heart attack Father   . Stroke Sister   . Hemophilia Child   . Cancer    . Coronary artery disease    . Cancer Brother   . Cancer Maternal Aunt   . Cancer Maternal Uncle   . Cancer Paternal Aunt   . Cancer Paternal Uncle   . Cancer Brother    Social  History   Social History  . Marital status: Legally Separated    Spouse name: N/A  . Number of children: 4  . Years of education: N/A   Occupational History  . Disabled    Social History Main Topics  . Smoking status: Never Smoker  . Smokeless tobacco: Never Used  . Alcohol use No  . Drug use: No  . Sexual activity: Not Asked   Other Topics Concern  . None   Social History Narrative   Patient does not get regular exercise   Denies caffeine use    Past Surgical History:  Procedure Laterality Date  . ABDOMINAL HYSTERECTOMY    . Arm Surgery Bilateral    due to fall-pt broke both forearms  . BIOPSY N/A 07/01/2013   Procedure: BIOPSY;  Surgeon: Rogene Houston, MD;  [facility]: [facility];  Service: Endoscopy;  Laterality: N/A;  . CHOLECYSTECTOMY    . COLONOSCOPY  08/06/2011   Procedure: COLONOSCOPY;  Surgeon: Rogene Houston, MD;  [facility];  Service: Endoscopy;  Laterality: N/A;  215  . COLONOSCOPY WITH PROPOFOL N/A 12/31/2012   Procedure: COLONOSCOPY WITH PROPOFOL;  Surgeon: Rogene Houston, MD;  Location: [facility];  Service: Endoscopy;  Laterality: N/A;  in cecum at 0814, total withdrawal time 47min  . CORONARY ANGIOPLASTY WITH STENT PLACEMENT    . ESOPHAGEAL DILATION N/A 06/07/2015  Procedure: ESOPHAGEAL DILATION;  Surgeon: Rogene Houston, MD;  [facility];  Service: Endoscopy;  Laterality: N/A;  . ESOPHAGOGASTRODUODENOSCOPY N/A 06/07/2015   Procedure: ESOPHAGOGASTRODUODENOSCOPY (EGD);  Surgeon: Rogene Houston, MD;  [facility];  Service: Endoscopy;  Laterality: N/A;  2:45 - moved to 1:00 - Ann to notify pt  . ESOPHAGOGASTRODUODENOSCOPY (EGD) WITH PROPOFOL N/A 12/31/2012   Procedure: ESOPHAGOGASTRODUODENOSCOPY (EGD) WITH PROPOFOL;  Surgeon: Rogene Houston, MD;  [facility];  Service: Endoscopy;  Laterality: N/A;  GE junction 37,  . ESOPHAGOGASTRODUODENOSCOPY (EGD) WITH PROPOFOL N/A 07/01/2013   Procedure: ESOPHAGOGASTRODUODENOSCOPY (EGD)  WITH PROPOFOL;  Surgeon: Rogene Houston, MD;  [facility];  Service: Endoscopy;  Laterality: N/A;  950  . MALONEY DILATION N/A 12/31/2012   Procedure: MALONEY DILATION;  Surgeon: Rogene Houston, MD;  [facility];  Service: Endoscopy;  Laterality: N/A;  used a # 54,#56  . MALONEY DILATION N/A 07/01/2013   Procedure: Venia Minks DILATION;  Surgeon: Rogene Houston, MD;  [facility];  Service: Endoscopy;  Laterality: N/A;  54/58; no heme present   Past Medical History:  Diagnosis Date  . Anxiety   . Anxiety and depression   . Arthritis   . CAD (coronary artery disease)    Stent placement circumflex coronary 2007, catheterization 2008 patent stents. Normal LV function  . Chest pain   . CHF (congestive heart failure) ([place])   . COPD (chronic obstructive pulmonary disease) ([place])   . Depression   . Diabetes mellitus    Insulin dependent  . Diabetic polyneuropathy (HCC)     severe on multiple medications  . Dyslipidemia   . GERD (gastroesophageal reflux disease)   . Headache(784.0)   . 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 MI (myocardial infarction)    2007  . Obstructive sleep apnea    CPAP machine at night  . Palpitations   . Tachycardia    BP 121/80   Pulse 79   SpO2 95%   Opioid Risk Score:   Fall Risk Score:  `1  Depression screen PHQ 2/9  Depression screen [facility] 2/9 05/14/2016 10/11/2015 07/12/2015 07/12/2015 05/14/2015 05/14/2015 05/02/2015  Decreased Interest 0 0 0 0 0 0 0  Down, Depressed, Hopeless 0 0 0 0 0 0 0  PHQ - 2 Score 0 0 0 0 0 0 0  Altered sleeping - - - - - - -  Tired, decreased energy - - - - - - -  Change in appetite - - - - - - -  Feeling bad or failure about yourself  - - - - - - -  Trouble concentrating - - - - - - -  Moving slowly or fidgety/restless - - - - - - -  Suicidal thoughts - - - - - - -  PHQ-9 Score - - - - - - -  Some recent data might be hidden    Review of Systems  HENT: Negative.   Eyes: Negative.   Respiratory: Positive for apnea.     Cardiovascular: Negative.   Gastrointestinal: Positive for constipation, diarrhea and nausea.  Endocrine: Negative.   Genitourinary: Negative.   Musculoskeletal: Positive for neck pain.  Skin: Negative.   Allergic/Immunologic: Negative.   Neurological: Positive for dizziness, weakness and numbness.  Hematological: Negative.   Psychiatric/Behavioral: The patient is nervous/anxious.        Objective:   Physical Exam  Constitutional: She is oriented to person, place, and time. She appears  well-developed and well-nourished.  HENT:  Head: Normocephalic and atraumatic.  Neck: Normal range of motion. Neck supple.  Cardiovascular: Normal rate and regular rhythm.   Pulmonary/Chest: Effort normal and breath sounds normal.  Musculoskeletal:  Normal Muscle Bulk and Muscle Testing Reveals: Upper Extremities: Full ROM and Muscle Strength 5/5 Thoracic Paraspinal Tenderness: T-1-T-3  T-7-T-9 Lumbar Paraspinal Tenderness: L-3-L-5 Lower Extremities: Decreased ROM and Muscle Strength 4/5 Bilateral Lower Extremities Flexion Produces Pain into Lumbar, Bilateral Hips and Popliteal Fossa Arises from Table Slowly using walker for support Antalgic Walker    Neurological: She is alert and oriented to person, place, and time.  Skin: Skin is warm.  Psychiatric: She has a normal mood and affect.  Nursing note and vitals reviewed.         Assessment & Plan:  1.Lumbar pain lumbar spondylosis: Encouraged to increase Activity as tolerated. 06/02/2016 Refilled: MS Contin 30 MG one tablet every 8 hours #90 and Continue Tramadol 50 mg one tablet every 6 hours as needed #120.  We will continue the opioid monitoring program, this consists of regular clinic visits, examinations, urine drug screen, pill counts as well as use of [place] Controlled Substance Reporting System. 2. Lumbar Radiculitis: Continue Gabapentin 3. Bilateral Knee Pain/ Degenerative: Continue Voltaren Gel. Continue to Monitor.  06/02/2016. 4. Severe diabetic poly neuropathy: Continue Gabapentin. 06/02/2016 5. Insomnia: On Trazodone PCP Following  6. Anxiety: Reviewed Dr. Letta Pate Note: Xanax 1 mg TID this month, will start the weaning process, she verbalizes understanding. [name] will continue the weaning process of Xanax, she verbalizes understanding.   20 minutes of face to face patient care time was spent during this visit. All questions were encouraged and answered.  F/U in 1 month

## 2016-06-02 NOTE — Patient Instructions (Signed)
Continue with weaning of Xanax:   Call Office with any questions: [phone]

## 2016-06-03 ENCOUNTER — Other Ambulatory Visit: Payer: Self-pay | Admitting: "Endocrinology

## 2016-06-05 LAB — 6-ACETYLMORPHINE,TOXASSURE ADD
6-ACETYLMORPHINE: NEGATIVE
6-acetylmorphine: NOT DETECTED ng/mg creat

## 2016-06-05 LAB — TOXASSURE SELECT,+ANTIDEPR,UR

## 2016-06-27 ENCOUNTER — Telehealth: Payer: Self-pay | Admitting: *Deleted

## 2016-06-27 NOTE — Telephone Encounter (Signed)
Small amount of oxazepam has shown positive in UDS from march. All other prescribed medications are accounted for. She has not taken anything that would trigger the metabolite.  Her significant other takes [place] so she did not take one of his sleeping pills and she denies ever taking anything that does not belong to her.  I checked with toxicologist and he said it would have been one of the benzos listed that breaks down into oxazepam (not aprazolam). She denies taking anything except what she is prescribed.

## 2016-06-30 ENCOUNTER — Telehealth: Payer: Self-pay | Admitting: Registered Nurse

## 2016-06-30 ENCOUNTER — Encounter: Payer: Medicaid Other | Attending: Physical Medicine & Rehabilitation

## 2016-06-30 ENCOUNTER — Encounter: Payer: Self-pay | Admitting: Physical Medicine & Rehabilitation

## 2016-06-30 ENCOUNTER — Ambulatory Visit (HOSPITAL_BASED_OUTPATIENT_CLINIC_OR_DEPARTMENT_OTHER): Payer: Medicaid Other | Admitting: Physical Medicine & Rehabilitation

## 2016-06-30 ENCOUNTER — Ambulatory Visit: Payer: Medicaid Other | Admitting: Physical Medicine & Rehabilitation

## 2016-06-30 VITALS — BP 105/66 | HR 94 | Resp 14

## 2016-06-30 DIAGNOSIS — Z79899 Other long term (current) drug therapy: Secondary | ICD-10-CM

## 2016-06-30 DIAGNOSIS — E1142 Type 2 diabetes mellitus with diabetic polyneuropathy: Secondary | ICD-10-CM | POA: Insufficient documentation

## 2016-06-30 DIAGNOSIS — K219 Gastro-esophageal reflux disease without esophagitis: Secondary | ICD-10-CM | POA: Diagnosis not present

## 2016-06-30 DIAGNOSIS — M5489 Other dorsalgia: Secondary | ICD-10-CM | POA: Diagnosis not present

## 2016-06-30 DIAGNOSIS — R000 Tachycardia, unspecified: Secondary | ICD-10-CM | POA: Diagnosis not present

## 2016-06-30 DIAGNOSIS — M79605 Pain in left leg: Secondary | ICD-10-CM | POA: Insufficient documentation

## 2016-06-30 DIAGNOSIS — Z760 Encounter for issue of repeat prescription: Secondary | ICD-10-CM | POA: Insufficient documentation

## 2016-06-30 DIAGNOSIS — I2510 Atherosclerotic heart disease of native coronary artery without angina pectoris: Secondary | ICD-10-CM | POA: Insufficient documentation

## 2016-06-30 DIAGNOSIS — M47816 Spondylosis without myelopathy or radiculopathy, lumbar region: Secondary | ICD-10-CM | POA: Diagnosis not present

## 2016-06-30 DIAGNOSIS — G894 Chronic pain syndrome: Secondary | ICD-10-CM

## 2016-06-30 DIAGNOSIS — G4700 Insomnia, unspecified: Secondary | ICD-10-CM | POA: Diagnosis not present

## 2016-06-30 DIAGNOSIS — I509 Heart failure, unspecified: Secondary | ICD-10-CM | POA: Insufficient documentation

## 2016-06-30 DIAGNOSIS — J449 Chronic obstructive pulmonary disease, unspecified: Secondary | ICD-10-CM | POA: Diagnosis not present

## 2016-06-30 DIAGNOSIS — E1140 Type 2 diabetes mellitus with diabetic neuropathy, unspecified: Secondary | ICD-10-CM

## 2016-06-30 DIAGNOSIS — E785 Hyperlipidemia, unspecified: Secondary | ICD-10-CM | POA: Insufficient documentation

## 2016-06-30 DIAGNOSIS — F418 Other specified anxiety disorders: Secondary | ICD-10-CM | POA: Insufficient documentation

## 2016-06-30 DIAGNOSIS — Z5181 Encounter for therapeutic drug level monitoring: Secondary | ICD-10-CM

## 2016-06-30 DIAGNOSIS — M79604 Pain in right leg: Secondary | ICD-10-CM | POA: Diagnosis not present

## 2016-06-30 DIAGNOSIS — R079 Chest pain, unspecified: Secondary | ICD-10-CM | POA: Diagnosis not present

## 2016-06-30 DIAGNOSIS — I252 Old myocardial infarction: Secondary | ICD-10-CM | POA: Insufficient documentation

## 2016-06-30 DIAGNOSIS — Z794 Long term (current) use of insulin: Secondary | ICD-10-CM | POA: Diagnosis not present

## 2016-06-30 DIAGNOSIS — R51 Headache: Secondary | ICD-10-CM | POA: Diagnosis not present

## 2016-06-30 DIAGNOSIS — M1990 Unspecified osteoarthritis, unspecified site: Secondary | ICD-10-CM | POA: Insufficient documentation

## 2016-06-30 DIAGNOSIS — M47896 Other spondylosis, lumbar region: Secondary | ICD-10-CM | POA: Diagnosis not present

## 2016-06-30 DIAGNOSIS — Z1401 Asymptomatic hemophilia A carrier: Secondary | ICD-10-CM | POA: Diagnosis not present

## 2016-06-30 DIAGNOSIS — G8929 Other chronic pain: Secondary | ICD-10-CM | POA: Insufficient documentation

## 2016-06-30 DIAGNOSIS — R002 Palpitations: Secondary | ICD-10-CM | POA: Diagnosis not present

## 2016-06-30 DIAGNOSIS — G4733 Obstructive sleep apnea (adult) (pediatric): Secondary | ICD-10-CM | POA: Diagnosis not present

## 2016-06-30 MED ORDER — ALPRAZOLAM 0.5 MG PO TABS: 0.5000 mg | ORAL_TABLET | Freq: Four times a day (QID) | ORAL | 0 refills | Status: DC

## 2016-06-30 MED ORDER — MORPHINE SULFATE ER 30 MG PO TBCR: 30.0000 mg | EXTENDED_RELEASE_TABLET | Freq: Three times a day (TID) | ORAL | 0 refills | Status: DC

## 2016-06-30 MED ORDER — METHOCARBAMOL 500 MG PO TABS
500.0000 mg | ORAL_TABLET | Freq: Three times a day (TID) | ORAL | 0 refills | Status: DC
Start: 2016-06-30 — End: 2019-12-22

## 2016-06-30 NOTE — Progress Notes (Signed)
Subjective:    Patient ID: [name], female    DOB: March 08, 1951, 66 y.o.   MRN: [id number]  HPI   66 year old female with chronic low back pain on chronic opiates as well as benzodiazepines. She remains independent with her ADLs and mobility. Slipped on grass and fell last Wednesday , now has midback pain No new numbness or tingling or lower extremity weakness noted. No pain with breathing.  Results from urine drug screen 06/02/2016. Reviewed with patient and her fianc Pain Inventory Average Pain 8 Pain Right Now 7 My pain is constant, sharp, burning, dull, stabbing, tingling and aching  In the last 24 hours, has pain interfered with the following? General activity 5 Relation with others 8 Enjoyment of life 9 What TIME of day is your pain at its worst? morning,night  Sleep (in general) Poor  Pain is worse with: walking, bending, sitting, inactivity, standing and some activites Pain improves with: medication Relief from Meds: 2  Mobility walk with assistance use a cane use a walker how many minutes can you walk? 5 ability to climb steps?  no do you drive?  no Do you have any goals in this area?  no  Function I need assistance with the following:  dressing, bathing, meal prep, household duties and shopping  Neuro/Psych bladder control problems bowel control problems weakness numbness tingling trouble walking spasms dizziness confusion depression anxiety  Prior Studies Any changes since last visit?  no  Physicians involved in your care Any changes since last visit?  no   Family History  Problem Relation Age of Onset  . Heart attack Mother   . Colon cancer Mother   . Heart attack Father   . Stroke Sister   . Hemophilia Child   . Cancer    . Coronary artery disease    . Cancer Brother   . Cancer Maternal Aunt   . Cancer Maternal Uncle   . Cancer Paternal Aunt   . Cancer Paternal Uncle   . Cancer Brother    Social History   Social  History  . Marital status: Legally Separated    Spouse name: N/A  . Number of children: 4  . Years of education: N/A   Occupational History  . Disabled    Social History Main Topics  . Smoking status: Never Smoker  . Smokeless tobacco: Never Used  . Alcohol use No  . Drug use: No  . Sexual activity: Not Asked   Other Topics Concern  . None   Social History Narrative   Patient does not get regular exercise   Denies caffeine use    Past Surgical History:  Procedure Laterality Date  . ABDOMINAL HYSTERECTOMY    . Arm Surgery Bilateral    due to fall-pt broke both forearms  . BIOPSY N/A 07/01/2013   Procedure: BIOPSY;  Surgeon: Rogene Houston, MD;  [facility];  Service: Endoscopy;  Laterality: N/A;  . CHOLECYSTECTOMY    . COLONOSCOPY  08/06/2011   Procedure: COLONOSCOPY;  Surgeon: Rogene Houston, MD;  [facility];  Service: Endoscopy;  Laterality: N/A;  215  . COLONOSCOPY WITH PROPOFOL N/A 12/31/2012   Procedure: COLONOSCOPY WITH PROPOFOL;  Surgeon: Rogene Houston, MD;  [facility];  Service: Endoscopy;  Laterality: N/A;  in cecum at 0814, total withdrawal time 69mn  . CORONARY ANGIOPLASTY WITH STENT PLACEMENT    . ESOPHAGEAL DILATION N/A 06/07/2015   Procedure: ESOPHAGEAL DILATION;  Surgeon: NRogene Houston  MD;  [facility];  Service: Endoscopy;  Laterality: N/A;  . ESOPHAGOGASTRODUODENOSCOPY N/A 06/07/2015   Procedure: ESOPHAGOGASTRODUODENOSCOPY (EGD);  Surgeon: Rogene Houston, MD;  [facility];  Service: Endoscopy;  Laterality: N/A;  2:45 - moved to 1:00 - Ann to notify pt  . ESOPHAGOGASTRODUODENOSCOPY (EGD) WITH PROPOFOL N/A 12/31/2012   Procedure: ESOPHAGOGASTRODUODENOSCOPY (EGD) WITH PROPOFOL;  Surgeon: Rogene Houston, MD;  [facility];  Service: Endoscopy;  Laterality: N/A;  GE junction 37,  . ESOPHAGOGASTRODUODENOSCOPY (EGD) WITH PROPOFOL N/A 07/01/2013   Procedure: ESOPHAGOGASTRODUODENOSCOPY (EGD) WITH PROPOFOL;   Surgeon: Rogene Houston, MD;  [facility];  Service: Endoscopy;  Laterality: N/A;  950  . MALONEY DILATION N/A 12/31/2012   Procedure: MALONEY DILATION;  Surgeon: Rogene Houston, MD;  Location[facility];  Service: Endoscopy;  Laterality: N/A;  used a # 54,#56  . MALONEY DILATION N/A 07/01/2013   Procedure: Venia Minks DILATION;  Surgeon: Rogene Houston, MD;  Location[facility];  Service: Endoscopy;  Laterality: N/A;  54/58; no heme present   Past Medical History:  Diagnosis Date  . Anxiety   . Anxiety and depression   . Arthritis   . CAD (coronary artery disease)    Stent placement circumflex coronary 2007, catheterization 2008 patent stents. Normal LV function  . Chest pain   . CHF (congestive heart failure) ([place])   . COPD (chronic obstructive pulmonary disease) ([place])   . Depression   . Diabetes mellitus    Insulin dependent  . Diabetic polyneuropathy (HCC)     severe on multiple medications  . Dyslipidemia   . GERD (gastroesophageal reflux disease)   . Headache(784.0)   . 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 MI (myocardial infarction) ([place])    2007  . Obstructive sleep apnea    CPAP machine at night  . Palpitations   . Tachycardia    BP 105/66 (BP Location: Right Arm, Patient Position: Sitting, Cuff Size: Large)   Pulse 94   Resp 14   SpO2 93%   Opioid Risk Score:   Fall Risk Score:  `1  Depression screen PHQ 2/9  Depression screen [facility] 2/9 05/14/2016 10/11/2015 07/12/2015 07/12/2015 05/14/2015 05/14/2015 05/02/2015  Decreased Interest 0 0 0 0 0 0 0  Down, Depressed, Hopeless 0 0 0 0 0 0 0  PHQ - 2 Score 0 0 0 0 0 0 0  Altered sleeping - - - - - - -  Tired, decreased energy - - - - - - -  Change in appetite - - - - - - -  Feeling bad or failure about yourself  - - - - - - -  Trouble concentrating - - - - - - -  Moving slowly or fidgety/restless - - - - - - -  Suicidal thoughts - - - - - - -  PHQ-9 Score - - - - - - -  Some recent data might be hidden    Review of Systems    Constitutional: Positive for appetite change.  HENT: Negative.   Eyes: Negative.   Respiratory: Positive for apnea and shortness of breath.   Cardiovascular: Positive for leg swelling.  Gastrointestinal: Positive for abdominal pain, constipation and nausea.  Endocrine: Negative.   Genitourinary: Positive for dysuria.  Musculoskeletal: Positive for back pain and gait problem.  Skin: Positive for rash.  Neurological: Positive for dizziness, weakness and numbness.       Tingling  Psychiatric/Behavioral: Positive for confusion and  dysphoric mood. The patient is nervous/anxious.   All other systems reviewed and are negative.      Objective:   Physical Exam  Constitutional: She is oriented to person, place, and time. She appears well-developed and well-nourished.  HENT:  Head: Normocephalic and atraumatic.  Eyes: Conjunctivae and EOM are normal. Pupils are equal, round, and reactive to light.  Neck: Normal range of motion.  Neurological: She is alert and oriented to person, place, and time. She displays no tremor. Gait abnormal.  Ambulates with walker  Psychiatric: Her speech is normal. Judgment and thought content normal. Her affect is blunt. She is withdrawn. Cognition and memory are normal.  Nursing note and vitals reviewed.   Tenderness to palpation T7-L5 Motor strength is 5/5 bilateral hip flexor, knee extensor, ankle dorsiflexor. Negative straight leg raising. Normal range of motion bilateral knees and ankles.       Assessment & Plan:   Assessment & Plan:  1.Lumbar pain lumbar spondylosis: Encouraged to increase Activity as tolerated. No improvement with MBB MS Contin 30 MG one tablet every 8 hours #90 and Continue Tramadol 50 mg one tablet every 6 hours as needed #120.  We will continue the opioid monitoring program, this consists of regular clinic visits, examinations, urine drug screen, pill counts as well as use of [place] Controlled Substance Reporting  System. UDS was consistent for Medications but also showing evidence of oxazepam- boyfriend on Librium but denies taking Medications from him. Will recheck and if present again will d/c from practice for violation of CSA   2. Bilateral Knee Pain/ Degenerative: Continue Voltaren Gel. Patient states she has not had relief from injections Continue to Monitor. 06/02/2016. 4. Severe diabetic poly neuropathy: Continue Gabapentin. 06/02/2016 5.Thoraco-lumbar strain after fall Right T7-L5, 2wk supply Robaxin 538m TID , no RF.  If no improvement in 1 wk may need T spine xray 6. Anxiety: Reduce Xanax .5 mg QID this month, trying to minimize drug interactions, polypharmacy, next visit can wean further to TID Once the patient weans down to 0.580m3 times a day, would start low-dose Cymbalta and cont xanax wean

## 2016-06-30 NOTE — Patient Instructions (Signed)
If the muscle relaxers do not relieve the pain after a week please call and you may need an xray

## 2016-07-01 NOTE — Telephone Encounter (Signed)
Received a staff message from Psychiatry, Ms. [employer] would like the referral to come from PCP. Call was placed to Ms. Norwood, she verbalizes understanding.

## 2016-07-01 NOTE — Telephone Encounter (Signed)
Charlett Blake, MD  Bayard Hugger, NP  Cc: Caro Hight, RN        Discussed results of urine drug screen from last month. Positive oxazepam. She denies taking this, but her boyfriend is on chlordiazepoxide, which metabolizes to oxazepam   Rechecking UDS again and if this is present again would discharge patient on a weaning schedule

## 2016-07-04 ENCOUNTER — Telehealth: Payer: Self-pay | Admitting: *Deleted

## 2016-07-04 ENCOUNTER — Other Ambulatory Visit: Payer: Self-pay | Admitting: *Deleted

## 2016-07-04 LAB — TOXASSURE SELECT,+ANTIDEPR,UR

## 2016-07-04 LAB — 6-ACETYLMORPHINE,TOXASSURE ADD
6-ACETYLMORPHINE: NEGATIVE
6-acetylmorphine: NOT DETECTED ng/mg creat

## 2016-07-04 MED ORDER — ALPRAZOLAM 0.5 MG PO TABS: 0.5000 mg | ORAL_TABLET | Freq: Four times a day (QID) | ORAL | 0 refills | Status: DC

## 2016-07-04 NOTE — Telephone Encounter (Signed)
Please add these meds to her list  Thanks

## 2016-07-04 NOTE — Telephone Encounter (Addendum)
Urine drug screen for this encounter is consistent for prescribed medication. The drug screen is showing citalopram and bupropion present and unexpected.  I called and asked Karron if she is taking them and she says emphatically no.  All UDS from last year and this are positive for these medications.  I called [facility] to ask pharmacist if they are dispensing these medications and she has been taking these meds the entire time.  I am adding to medication list due to Dr Letta Pate plan to start her on Cymbalta as he weans the alprazolam.  Not having the information can result in Serotonin syndrome. I have left a message for Dalyce to call me back to discuss this since she does not seem to be aware that she is taking these medications. I spoke with Ann and made her aware and told her to talk with Dr Woody Seller about her medications. I think Her pharmacy dispenses meds in blister packs and she takes them from the pre filled blister packs.

## 2016-07-25 ENCOUNTER — Telehealth: Payer: Self-pay | Admitting: Registered Nurse

## 2016-07-25 NOTE — Telephone Encounter (Signed)
On 07/25/2016 the [place] was reviewed no conflict was seen on the [place] with multiple prescribers. [name]  has a signed narcotic contract with our office. If there were any discrepancies this would have been reported to her physician.

## 2016-07-28 ENCOUNTER — Encounter: Payer: Medicaid Other | Attending: Physical Medicine & Rehabilitation | Admitting: Registered Nurse

## 2016-07-28 ENCOUNTER — Encounter: Payer: Self-pay | Admitting: Registered Nurse

## 2016-07-28 VITALS — BP 128/71 | HR 86

## 2016-07-28 DIAGNOSIS — M79605 Pain in left leg: Secondary | ICD-10-CM | POA: Insufficient documentation

## 2016-07-28 DIAGNOSIS — F411 Generalized anxiety disorder: Secondary | ICD-10-CM | POA: Diagnosis not present

## 2016-07-28 DIAGNOSIS — E785 Hyperlipidemia, unspecified: Secondary | ICD-10-CM | POA: Insufficient documentation

## 2016-07-28 DIAGNOSIS — M47816 Spondylosis without myelopathy or radiculopathy, lumbar region: Secondary | ICD-10-CM | POA: Diagnosis not present

## 2016-07-28 DIAGNOSIS — M5416 Radiculopathy, lumbar region: Secondary | ICD-10-CM

## 2016-07-28 DIAGNOSIS — R51 Headache: Secondary | ICD-10-CM | POA: Insufficient documentation

## 2016-07-28 DIAGNOSIS — R000 Tachycardia, unspecified: Secondary | ICD-10-CM | POA: Insufficient documentation

## 2016-07-28 DIAGNOSIS — R079 Chest pain, unspecified: Secondary | ICD-10-CM | POA: Insufficient documentation

## 2016-07-28 DIAGNOSIS — E1142 Type 2 diabetes mellitus with diabetic polyneuropathy: Secondary | ICD-10-CM | POA: Insufficient documentation

## 2016-07-28 DIAGNOSIS — G4700 Insomnia, unspecified: Secondary | ICD-10-CM | POA: Diagnosis not present

## 2016-07-28 DIAGNOSIS — G894 Chronic pain syndrome: Secondary | ICD-10-CM | POA: Diagnosis not present

## 2016-07-28 DIAGNOSIS — M5489 Other dorsalgia: Secondary | ICD-10-CM | POA: Diagnosis not present

## 2016-07-28 DIAGNOSIS — Z794 Long term (current) use of insulin: Secondary | ICD-10-CM | POA: Insufficient documentation

## 2016-07-28 DIAGNOSIS — G8929 Other chronic pain: Secondary | ICD-10-CM | POA: Insufficient documentation

## 2016-07-28 DIAGNOSIS — M47896 Other spondylosis, lumbar region: Secondary | ICD-10-CM | POA: Insufficient documentation

## 2016-07-28 DIAGNOSIS — F418 Other specified anxiety disorders: Secondary | ICD-10-CM | POA: Diagnosis not present

## 2016-07-28 DIAGNOSIS — I2510 Atherosclerotic heart disease of native coronary artery without angina pectoris: Secondary | ICD-10-CM | POA: Insufficient documentation

## 2016-07-28 DIAGNOSIS — M79604 Pain in right leg: Secondary | ICD-10-CM | POA: Diagnosis not present

## 2016-07-28 DIAGNOSIS — M1990 Unspecified osteoarthritis, unspecified site: Secondary | ICD-10-CM | POA: Diagnosis not present

## 2016-07-28 DIAGNOSIS — G4733 Obstructive sleep apnea (adult) (pediatric): Secondary | ICD-10-CM | POA: Insufficient documentation

## 2016-07-28 DIAGNOSIS — M7061 Trochanteric bursitis, right hip: Secondary | ICD-10-CM

## 2016-07-28 DIAGNOSIS — I509 Heart failure, unspecified: Secondary | ICD-10-CM | POA: Insufficient documentation

## 2016-07-28 DIAGNOSIS — K219 Gastro-esophageal reflux disease without esophagitis: Secondary | ICD-10-CM | POA: Insufficient documentation

## 2016-07-28 DIAGNOSIS — I252 Old myocardial infarction: Secondary | ICD-10-CM | POA: Diagnosis not present

## 2016-07-28 DIAGNOSIS — Z760 Encounter for issue of repeat prescription: Secondary | ICD-10-CM | POA: Diagnosis not present

## 2016-07-28 DIAGNOSIS — R002 Palpitations: Secondary | ICD-10-CM | POA: Insufficient documentation

## 2016-07-28 DIAGNOSIS — Z79899 Other long term (current) drug therapy: Secondary | ICD-10-CM

## 2016-07-28 DIAGNOSIS — J449 Chronic obstructive pulmonary disease, unspecified: Secondary | ICD-10-CM | POA: Insufficient documentation

## 2016-07-28 DIAGNOSIS — Z5181 Encounter for therapeutic drug level monitoring: Secondary | ICD-10-CM | POA: Diagnosis not present

## 2016-07-28 DIAGNOSIS — F3289 Other specified depressive episodes: Secondary | ICD-10-CM

## 2016-07-28 DIAGNOSIS — Z1401 Asymptomatic hemophilia A carrier: Secondary | ICD-10-CM | POA: Diagnosis not present

## 2016-07-28 MED ORDER — MORPHINE SULFATE ER 30 MG PO TBCR: 30.0000 mg | EXTENDED_RELEASE_TABLET | Freq: Three times a day (TID) | ORAL | 0 refills | Status: DC

## 2016-07-28 MED ORDER — ALPRAZOLAM 0.5 MG PO TABS: 0.5000 mg | ORAL_TABLET | Freq: Three times a day (TID) | ORAL | 0 refills | Status: DC | PRN

## 2016-07-28 MED ORDER — TRAMADOL HCL 50 MG PO TABS: 50.0000 mg | ORAL_TABLET | Freq: Four times a day (QID) | ORAL | 2 refills | Status: DC | PRN

## 2016-07-28 NOTE — Progress Notes (Signed)
Subjective:    Patient ID: [name], female    DOB: 1950/10/20, 66 y.o.   MRN: [id number]  HPI: [name] is a 66year old female who returns for follow up appointmentfor chronic pain and medication refill. She states her pain is located in her bilateral shoulders,lowerback radiating into her bilateral lower extremities, bilateralkneesand bilateral feet. She rates her pain 9. Her current exercise regime is performing chair exercises and walking short distances with her walker.   Last UDS was on 06/30/2016 it was consistent.    Pain Inventory Average Pain 8 Pain Right Now 9 My pain is sharp, burning, dull, stabbing, tingling and aching  In the last 24 hours, has pain interfered with the following? General activity 7 Relation with others 7 Enjoyment of life 8 What TIME of day is your pain at its worst? morning and night Sleep (in general) Poor  Pain is worse with: walking, bending, sitting, inactivity, standing and some activites Pain improves with: medication Relief from Meds: 2  Mobility use a walker how many minutes can you walk? 5 ability to climb steps?  no do you drive?  no Do you have any goals in this area?  no  Function disabled: date disabled . I need assistance with the following:  dressing, bathing, toileting, household duties and shopping Do you have any goals in this area?  no  Neuro/Psych bladder control problems bowel control problems weakness numbness tingling trouble walking dizziness anxiety  Prior Studies Any changes since last visit?  no  Physicians involved in your care Any changes since last visit?  no   Family History  Problem Relation Age of Onset  . Heart attack Mother   . Colon cancer Mother   . Heart attack Father   . Stroke Sister   . Hemophilia Child   . Cancer Unknown   . Coronary artery disease Unknown   . Cancer Brother   . Cancer Maternal Aunt   . Cancer Maternal Uncle   . Cancer Paternal Aunt   .  Cancer Paternal Uncle   . Cancer Brother    Social History   Social History  . Marital status: Legally Separated    Spouse name: N/A  . Number of children: 4  . Years of education: N/A   Occupational History  . Disabled    Social History Main Topics  . Smoking status: Never Smoker  . Smokeless tobacco: Never Used  . Alcohol use No  . Drug use: No  . Sexual activity: Not on file   Other Topics Concern  . Not on file   Social History Narrative   Patient does not get regular exercise   Denies caffeine use    Past Surgical History:  Procedure Laterality Date  . ABDOMINAL HYSTERECTOMY    . Arm Surgery Bilateral    due to fall-pt broke both forearms  . BIOPSY N/A 07/01/2013   Procedure: BIOPSY;  Surgeon: Rogene Houston, MD;  Location: [facility];  Service: Endoscopy;  Laterality: N/A;  . CHOLECYSTECTOMY    . COLONOSCOPY  08/06/2011   Procedure: COLONOSCOPY;  Surgeon: Rogene Houston, MD;  [facility];  Service: Endoscopy;  Laterality: N/A;  215  . COLONOSCOPY WITH PROPOFOL N/A 12/31/2012   Procedure: COLONOSCOPY WITH PROPOFOL;  Surgeon: Rogene Houston, MD;  Location: [facility];  Service: Endoscopy;  Laterality: N/A;  in cecum at 0814, total withdrawal time 69mn  . CORONARY ANGIOPLASTY WITH STENT PLACEMENT    .  ESOPHAGEAL DILATION N/A 06/07/2015   Procedure: ESOPHAGEAL DILATION;  Surgeon: Rogene Houston, MD;  [facility];  Service: Endoscopy;  Laterality: N/A;  . ESOPHAGOGASTRODUODENOSCOPY N/A 06/07/2015   Procedure: ESOPHAGOGASTRODUODENOSCOPY (EGD);  Surgeon: Rogene Houston, MD;  [facility];  Service: Endoscopy;  Laterality: N/A;  2:45 - moved to 1:00 - Ann to notify pt  . ESOPHAGOGASTRODUODENOSCOPY (EGD) WITH PROPOFOL N/A 12/31/2012   Procedure: ESOPHAGOGASTRODUODENOSCOPY (EGD) WITH PROPOFOL;  Surgeon: Rogene Houston, MD;  [facility]: [facility];  Service: Endoscopy;  Laterality: N/A;  GE junction 37,  . ESOPHAGOGASTRODUODENOSCOPY (EGD) WITH  PROPOFOL N/A 07/01/2013   Procedure: ESOPHAGOGASTRODUODENOSCOPY (EGD) WITH PROPOFOL;  Surgeon: Rogene Houston, MD;  [facility]: [facility];  Service: Endoscopy;  Laterality: N/A;  950  . MALONEY DILATION N/A 12/31/2012   Procedure: MALONEY DILATION;  Surgeon: Rogene Houston, MD;  Location: [facility];  Service: Endoscopy;  Laterality: N/A;  used a # 54,#56  . MALONEY DILATION N/A 07/01/2013   Procedure: Venia Minks DILATION;  Surgeon: Rogene Houston, MD;  Location: [facility];  Service: Endoscopy;  Laterality: N/A;  54/58; no heme present   Past Medical History:  Diagnosis Date  . Anxiety   . Anxiety and depression   . Arthritis   . CAD (coronary artery disease)    Stent placement circumflex coronary 2007, catheterization 2008 patent stents. Normal LV function  . Chest pain   . CHF (congestive heart failure) ([place])   . COPD (chronic obstructive pulmonary disease) ([place])   . Depression   . Diabetes mellitus    Insulin dependent  . Diabetic polyneuropathy (HCC)     severe on multiple medications  . Dyslipidemia   . GERD (gastroesophageal reflux disease)   . Headache(784.0)   . 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 MI (myocardial infarction) ([place])    2007  . Obstructive sleep apnea    CPAP machine at night  . Palpitations   . Tachycardia    BP 128/71   Pulse 86   SpO2 95%   Opioid Risk Score:   Fall Risk Score:  `1  Depression screen PHQ 2/9  Depression screen [facility] 2/9 05/14/2016 10/11/2015 07/12/2015 07/12/2015 05/14/2015 05/14/2015 05/02/2015  Decreased Interest 0 0 0 0 0 0 0  Down, Depressed, Hopeless 0 0 0 0 0 0 0  PHQ - 2 Score 0 0 0 0 0 0 0  Altered sleeping - - - - - - -  Tired, decreased energy - - - - - - -  Change in appetite - - - - - - -  Feeling bad or failure about yourself  - - - - - - -  Trouble concentrating - - - - - - -  Moving slowly or fidgety/restless - - - - - - -  Suicidal thoughts - - - - - - -  PHQ-9 Score - - - - - - -  Some recent data might be hidden    Review of Systems    HENT: Negative.   Eyes: Negative.   Respiratory: Negative.   Cardiovascular: Negative.   Gastrointestinal: Positive for constipation, nausea and vomiting.  Endocrine: Negative.   Genitourinary: Positive for dysuria.  Musculoskeletal: Positive for back pain and neck pain.  Skin: Negative.   Allergic/Immunologic: Negative.   Neurological: Positive for dizziness, weakness and numbness.  Hematological: Negative.   Psychiatric/Behavioral: The patient is nervous/anxious.        Objective:   Physical Exam  Constitutional: She  is oriented to person, place, and time. She appears well-developed and well-nourished.  HENT:  Head: Normocephalic and atraumatic.  Neck: Normal range of motion. Neck supple.  Cardiovascular: Normal rate and regular rhythm.   Pulmonary/Chest: Effort normal and breath sounds normal.  Musculoskeletal:  Normal Muscle Bulk and Muscle Testing Reveals: Upper Extremities: Full ROM and Muscle Strength 5/5 Thoracic Hypersensitivity  Lumbar Paraspinal Tenderness: L-3-L-5 Right Greater Trochanter Tenderness Lower Extremities: Full ROM and Muscle Strength 5/5 Arises from Table Slowly using walker for support Narrow Based Gait   Neurological: She is alert and oriented to person, place, and time.  Skin: Skin is warm and dry.  Psychiatric: She has a normal mood and affect.  Nursing note and vitals reviewed.         Assessment & Plan:  1.Lumbar pain lumbar spondylosis: Encouraged to increase Activity as tolerated. 07/28/2016 Refilled: MS Contin 30 MG one tablet every 8 hours #90 and ContinueTramadol 50 mg one tablet every 6 hours as needed #120.  We will continue the opioid monitoring program, this consists of regular clinic visits, examinations, urine drug screen, pill counts as well as use of [place] Controlled Substance Reporting System. 2. Lumbar Radiculitis: Continue Gabapentin. 07/28/2016 3. Bilateral Knee Pain/ Degenerative: Continue Voltaren Gel.  Continue to Monitor. 07/28/2016. 4. Severe diabetic poly neuropathy: Continue Gabapentin. 07/28/2016 5. Insomnia: Continue Trazodone PCP Following. 07/28/2016  6. Anxiety: Decreased: Xanax 0.5 mg TID, next month will prescribe low dose Cymbalta as per Dr. Letta Pate note.07/28/2016  20  minutes of face to face patient care time was spent during this visit. All questions were encouraged and answered.  F/U in 1 month

## 2016-07-31 ENCOUNTER — Other Ambulatory Visit: Payer: Self-pay | Admitting: "Endocrinology

## 2016-08-01 LAB — COMPREHENSIVE METABOLIC PANEL
ALT: 9 IU/L (ref 0–32)
AST: 8 IU/L (ref 0–40)
Albumin/Globulin Ratio: 1.4 (ref 1.2–2.2)
Albumin: 3.3 g/dL — ABNORMAL LOW (ref 3.6–4.8)
Alkaline Phosphatase: 74 IU/L (ref 39–117)
BUN/Creatinine Ratio: 20 (ref 12–28)
BUN: 14 mg/dL (ref 8–27)
Bilirubin Total: 0.3 mg/dL (ref 0.0–1.2)
CO2: 25 mmol/L (ref 18–29)
Calcium: 8.8 mg/dL (ref 8.7–10.3)
Chloride: 103 mmol/L (ref 96–106)
Creatinine, Ser: 0.7 mg/dL (ref 0.57–1.00)
GFR calc Af Amer: 107 mL/min/{1.73_m2} (ref >59)
GFR calc non Af Amer: 93 mL/min/{1.73_m2} (ref >59)
Globulin, Total: 2.3 g/dL (ref 1.5–4.5)
Glucose: 142 mg/dL — ABNORMAL HIGH (ref 65–99)
Potassium: 4.6 mmol/L (ref 3.5–5.2)
Sodium: 141 mmol/L (ref 134–144)
Total Protein: 5.6 g/dL — ABNORMAL LOW (ref 6.0–8.5)

## 2016-08-01 LAB — HGB A1C W/O EAG: Hgb A1c MFr Bld: 7.6 % — ABNORMAL HIGH (ref 4.8–5.6)

## 2016-08-07 ENCOUNTER — Ambulatory Visit (INDEPENDENT_AMBULATORY_CARE_PROVIDER_SITE_OTHER): Payer: Medicaid Other | Admitting: "Endocrinology

## 2016-08-07 ENCOUNTER — Encounter: Payer: Self-pay | Admitting: "Endocrinology

## 2016-08-07 VITALS — BP 130/87 | HR 96 | Ht 62.0 in | Wt 224.0 lb

## 2016-08-07 DIAGNOSIS — I10 Essential (primary) hypertension: Secondary | ICD-10-CM

## 2016-08-07 DIAGNOSIS — E1159 Type 2 diabetes mellitus with other circulatory complications: Secondary | ICD-10-CM | POA: Diagnosis not present

## 2016-08-07 DIAGNOSIS — E782 Mixed hyperlipidemia: Secondary | ICD-10-CM | POA: Diagnosis not present

## 2016-08-07 NOTE — Patient Instructions (Signed)

## 2016-08-07 NOTE — Progress Notes (Signed)
Subjective:    Patient ID: [name], female    DOB: 19-Feb-1951. Patient is being seen in f/u for management of diabetes.  Past Medical History:  Diagnosis Date  . Anxiety   . Anxiety and depression   . Arthritis   . CAD (coronary artery disease)    Stent placement circumflex coronary 2007, catheterization 2008 patent stents. Normal LV function  . Chest pain   . CHF (congestive heart failure) ([place])   . COPD (chronic obstructive pulmonary disease) ([place])   . Depression   . Diabetes mellitus    Insulin dependent  . Diabetic polyneuropathy (HCC)     severe on multiple medications  . Dyslipidemia   . GERD (gastroesophageal reflux disease)   . Headache(784.0)   . 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 MI (myocardial infarction) ([place])    2007  . Obstructive sleep apnea    CPAP machine at night  . Palpitations   . Tachycardia    Past Surgical History:  Procedure Laterality Date  . ABDOMINAL HYSTERECTOMY    . Arm Surgery Bilateral    due to fall-pt broke both forearms  . BIOPSY N/A 07/01/2013   Procedure: BIOPSY;  Surgeon: Rogene Houston, MD;  [facility];  Service: Endoscopy;  Laterality: N/A;  . CHOLECYSTECTOMY    . COLONOSCOPY  08/06/2011   Procedure: COLONOSCOPY;  Surgeon: Rogene Houston, MD;  [facility];  Service: Endoscopy;  Laterality: N/A;  215  . COLONOSCOPY WITH PROPOFOL N/A 12/31/2012   Procedure: COLONOSCOPY WITH PROPOFOL;  Surgeon: Rogene Houston, MD;  [facility];  Service: Endoscopy;  Laterality: N/A;  in cecum at 0814, total withdrawal time 41min  . CORONARY ANGIOPLASTY WITH STENT PLACEMENT    . ESOPHAGEAL DILATION N/A 06/07/2015   Procedure: ESOPHAGEAL DILATION;  Surgeon: Rogene Houston, MD;  [facility];  Service: Endoscopy;  Laterality: N/A;  . ESOPHAGOGASTRODUODENOSCOPY N/A 06/07/2015   Procedure: ESOPHAGOGASTRODUODENOSCOPY (EGD);  Surgeon: Rogene Houston, MD;  [facility];  Service: Endoscopy;  Laterality: N/A;   2:45 - moved to 1:00 - Ann to notify pt  . ESOPHAGOGASTRODUODENOSCOPY (EGD) WITH PROPOFOL N/A 12/31/2012   Procedure: ESOPHAGOGASTRODUODENOSCOPY (EGD) WITH PROPOFOL;  Surgeon: Rogene Houston, MD;  [facility];  Service: Endoscopy;  Laterality: N/A;  GE junction 37,  . ESOPHAGOGASTRODUODENOSCOPY (EGD) WITH PROPOFOL N/A 07/01/2013   Procedure: ESOPHAGOGASTRODUODENOSCOPY (EGD) WITH PROPOFOL;  Surgeon: Rogene Houston, MD;  [facility];  Service: Endoscopy;  Laterality: N/A;  950  . MALONEY DILATION N/A 12/31/2012   Procedure: MALONEY DILATION;  Surgeon: Rogene Houston, MD;  Location: [facility];  Service: Endoscopy;  Laterality: N/A;  used a # 54,#56  . MALONEY DILATION N/A 07/01/2013   Procedure: Venia Minks DILATION;  Surgeon: Rogene Houston, MD;  Location: [facility];  Service: Endoscopy;  Laterality: N/A;  54/58; no heme present   Social History   Social History  . Marital status: Legally Separated    Spouse name: N/A  . Number of children: 4  . Years of education: N/A   Occupational History  . Disabled    Social History Main Topics  . Smoking status: Never Smoker  . Smokeless tobacco: Never Used  . Alcohol use No  . Drug use: No  . Sexual activity: Not Asked   Other Topics Concern  . None   Social History Narrative   Patient does not get regular exercise   Denies caffeine  use    Outpatient Encounter Prescriptions as of 08/07/2016  Medication Sig  . ACCU-CHEK AVIVA PLUS test strip USE TO CHECK BLOOD SUGAR THREE TIMES DAILY - DX: E11.9  . ALPRAZolam (XANAX) 0.5 MG tablet Take 1 tablet (0.5 mg total) by mouth 3 (three) times daily as needed for anxiety.  Marland Kitchen aspirin EC 81 MG tablet Take 81 mg by mouth daily.  Marland Kitchen atorvastatin (LIPITOR) 40 MG tablet Take 40 mg by mouth daily.  . beclomethasone (QVAR) 40 MCG/ACT inhaler Inhale 1 puff into the lungs 2 (two) times daily.  Marland Kitchen buPROPion (WELLBUTRIN SR) 100 MG 12 hr tablet Take 100 mg by mouth daily.  . citalopram (CELEXA) 40 MG tablet  Take 40 mg by mouth daily.  Marland Kitchen dicyclomine (BENTYL) 10 MG capsule TAKE 1 CAPSULE BY MOUTH TWICE DAILY AS NEEDED FOR SPASMS.  Marland Kitchen estrogens, conjugated, (PREMARIN) 1.25 MG tablet Take 1.25 mg by mouth daily.   . fluticasone-salmeterol (ADVAIR HFA) 115-21 MCG/ACT inhaler Inhale 2 puffs into the lungs 2 (two) times daily.  Marland Kitchen gabapentin (NEURONTIN) 100 MG capsule Take 1 capsule (100 mg total) by mouth 3 (three) times daily.  Marland Kitchen GLOBAL EASE INJECT PEN NEEDLES 31G X 8 MM MISC USE AS DIRECTED FOUR TIMES DAILY  . ibuprofen (ADVIL,MOTRIN) 200 MG tablet Take 200 mg by mouth daily as needed for headache.  . Insulin Glargine (LANTUS SOLOSTAR) 100 UNIT/ML Solostar Pen Inject 50 Units into the skin at bedtime.  . INVOKANA 100 MG TABS tablet TAKE 1 TABLET BY MOUTH DAILY BEFORE BREAKFAST  . lidocaine (XYLOCAINE) 5 % ointment Apply 1 application topically at bedtime.   . metFORMIN (GLUCOPHAGE) 500 MG tablet TAKE ONE TABLET BY MOUTH TWICE DAILY WITH A MEAL  . methocarbamol (ROBAXIN) 500 MG tablet Take 1 tablet (500 mg total) by mouth 3 (three) times daily.  Marland Kitchen morphine (MS CONTIN) 30 MG 12 hr tablet Take 1 tablet (30 mg total) by mouth every 8 (eight) hours.  . nitroGLYCERIN (NITROSTAT) 0.4 MG SL tablet Place 1 tablet (0.4 mg total) under the tongue every 5 (five) minutes x 3 doses as needed. For chest pains  . ondansetron (ZOFRAN) 4 MG tablet Take 4 mg by mouth every 6 (six) hours as needed for nausea or vomiting.  . pantoprazole (PROTONIX) 40 MG tablet TAKE 1 TABLET BY MOUTH TWICE DAILY BEFORE MEALS  . potassium chloride (K-DUR,KLOR-CON) 10 MEQ tablet TAKE 2 TABLETS BY MOUTH 2 TIMES DAILY.  Marland Kitchen psyllium (METAMUCIL SMOOTH TEXTURE) 28 % packet Take 1 packet by mouth at bedtime.  . ranitidine (ZANTAC) 300 MG tablet Take 300 mg by mouth 2 (two) times daily.  . TOPROL XL 50 MG 24 hr tablet TAKE 1 TABLET BY MOUTH DAILY WITH OR IMMEDIATELY FOLLOWING A MEAL.  Marland Kitchen traMADol (ULTRAM) 50 MG tablet Take 1 tablet (50 mg total) by  mouth every 6 (six) hours as needed.  . traZODone (DESYREL) 25 mg TABS tablet Take 0.5 tablets (25 mg total) by mouth at bedtime. (Patient taking differently: Take 50 mg by mouth at bedtime. )  . VOLTAREN 1 % GEL APPLY 2 GRAMS ON BOTH KNEES THREE TIMES DAILY (ONLY FILL IF SHE ASKS)  . [DISCONTINUED] fluconazole (DIFLUCAN) 150 MG tablet Take 1 tablet (150 mg total) by mouth once.  . [DISCONTINUED] LANTUS SOLOSTAR 100 UNIT/ML Solostar Pen INJECT 50 UNITS INTO THE SKIN EVERY DAY WITH BREAKFAST   No facility-administered encounter medications on file as of 08/07/2016.    ALLERGIES: Allergies  Allergen Reactions  .  Nitrofuran Derivatives Itching and Swelling  . Amphetamine-Dextroamphet Er Swelling  . Pregabalin Swelling  . Topiramate Other (See Comments)    Tongue tingle  . Verelan [Verapamil] Rash   VACCINATION STATUS:  There is no immunization history on file for this patient.  Diabetes  She presents for her follow-up diabetic visit. She has type 2 diabetes mellitus. Onset time: She was diagnosed at approximate age of 18 years. Her disease course has been stable. There are no hypoglycemic associated symptoms. Pertinent negatives for hypoglycemia include no confusion, headaches, pallor or seizures. Associated symptoms include fatigue. Pertinent negatives for diabetes include no chest pain, no polydipsia, no polyphagia and no polyuria. There are no hypoglycemic complications. Symptoms are stable. Diabetic complications include autonomic neuropathy, heart disease, peripheral neuropathy and retinopathy. Risk factors for coronary artery disease include dyslipidemia, diabetes mellitus, obesity, hypertension and sedentary lifestyle. Current diabetic treatments: Lantus 65 units daily at bedtime, Humalog 15 units 3 times a day before meals. She mentions allergy to Victoza, Byetta, and metformin. Her weight is decreasing steadily (She lost 29 pounds overall.). She is following a generally unhealthy diet.  When asked about meal planning, she reported none. She has not had a previous visit with a dietitian. She never participates in exercise. Home blood sugar record trend: She did not bring her logs however her meter shows average of 1 50 g/dL for the last 7 days. Her breakfast blood glucose range is generally 140-180 mg/dl. Her overall blood glucose range is 140-180 mg/dl. An ACE inhibitor/angiotensin II receptor blocker is not being taken. Eye exam is current.  Hyperlipidemia  This is a chronic problem. The current episode started more than 1 year ago. The problem is uncontrolled. Recent lipid tests were reviewed and are high. Exacerbating diseases include diabetes and obesity. Pertinent negatives include no chest pain, myalgias or shortness of breath. Current antihyperlipidemic treatment includes statins. Risk factors for coronary artery disease include diabetes mellitus, dyslipidemia, hypertension, obesity and a sedentary lifestyle.  Hypertension  This is a chronic problem. The current episode started more than 1 year ago. Pertinent negatives include no chest pain, headaches, palpitations or shortness of breath. Risk factors for coronary artery disease include diabetes mellitus, dyslipidemia, obesity and sedentary lifestyle. Hypertensive end-organ damage includes CAD/MI and retinopathy.    Review of Systems  Constitutional: Positive for fatigue. Negative for chills, fever and unexpected weight change.  HENT: Negative for trouble swallowing and voice change.   Eyes: Negative for visual disturbance.  Respiratory: Negative for cough, shortness of breath and wheezing.   Cardiovascular: Negative for chest pain, palpitations and leg swelling.  Gastrointestinal: Negative for diarrhea, nausea and vomiting.  Endocrine: Negative for cold intolerance, heat intolerance, polydipsia, polyphagia and polyuria.  Musculoskeletal: Negative for arthralgias and myalgias.  Skin: Negative for color change, pallor, rash  and wound.  Neurological: Negative for seizures and headaches.  Psychiatric/Behavioral: Negative for confusion and suicidal ideas.    Objective:    BP 130/87   Pulse 96   Ht 5\' 2"  (1.575 m)   Wt 224 lb (101.6 kg)   BMI 40.97 kg/m   Wt Readings from Last 3 Encounters:  08/07/16 224 lb (101.6 kg)  05/14/16 228 lb (103.4 kg)  01/16/16 230 lb (104.3 kg)    Physical Exam  Constitutional: She is oriented to person, place, and time. She appears well-developed.  HENT:  Head: Normocephalic and atraumatic.  She has very poor dentition.  Eyes: EOM are normal.  Neck: Normal range of motion. Neck supple. No  tracheal deviation present. No thyromegaly present.  Cardiovascular: Normal rate and regular rhythm.   Pulmonary/Chest: Effort normal and breath sounds normal.  Abdominal: Soft. Bowel sounds are normal. There is no tenderness. There is no guarding.  Musculoskeletal: Normal range of motion. She exhibits no edema.  Neurological: She is alert and oriented to person, place, and time. She has normal reflexes. No cranial nerve deficit. Coordination normal.  Skin: Skin is warm and dry. No rash noted. No erythema. No pallor.  Psychiatric: She has a normal mood and affect. Judgment normal.    CMP     Component Value Date/Time   NA 141 07/31/2016 0913   K 4.6 07/31/2016 0913   CL 103 07/31/2016 0913   CO2 25 07/31/2016 0913   GLUCOSE 142 (H) 07/31/2016 0913   GLUCOSE 114 (H) 01/08/2016 1529   BUN 14 07/31/2016 0913   CREATININE 0.70 07/31/2016 0913   CREATININE 0.68 01/08/2016 1529   CALCIUM 8.8 07/31/2016 0913   PROT 5.6 (L) 07/31/2016 0913   ALBUMIN 3.3 (L) 07/31/2016 0913   AST 8 07/31/2016 0913   ALT 9 07/31/2016 0913   ALKPHOS 74 07/31/2016 0913   BILITOT 0.3 07/31/2016 0913   GFRNONAA 93 07/31/2016 0913   GFRNONAA >89 01/08/2016 1529   GFRAA 107 07/31/2016 0913   GFRAA >89 01/08/2016 1529   Diabetic Labs (most recent): Lab Results  Component Value Date   HGBA1C 7.6 (H)  07/31/2016   HGBA1C 7.6 (H) 04/15/2016   HGBA1C 8.0 (H) 01/08/2016    Lipid Panel     Component Value Date/Time   CHOL 184 04/15/2016 1121   TRIG 284 (H) 04/15/2016 1121   HDL 77 04/15/2016 1121   CHOLHDL 2.7 07/05/2015 0937   VLDL 53 (H) 07/05/2015 0937   LDLCALC 50 04/15/2016 1121     Assessment & Plan:   1. Type 2 diabetes mellitus with vascular disease ([place])  - Patient has currently uncontrolled symptomatic type 2 DM since  66 years of age,  with most recent A1c  stable at 7.6%,  generally improving from 8.6 %. Recent labs reviewed. -She came with controlled  fasting blood glucose profile mainly .   Her diabetes is complicated by coronary artery disease status post stent placement and patient remains at a high risk for more acute and chronic complications of diabetes which include CAD, CVA, CKD, retinopathy, and neuropathy. These are all discussed in detail with the patient.  - I have counseled the patient on diet management and weight loss, by adopting a carbohydrate restricted/protein rich diet. - She came with overall weight loss of 30 pounds of weight.  - Suggestion is made for patient to avoid simple carbohydrates   from her diet including Cakes , Desserts, Ice Cream,  Soda (  diet and regular) , Sweet Tea , Candies,  Chips, Cookies, Artificial Sweeteners,   and "Sugar-free" Products . This will help patient to have stable blood glucose profile and potentially avoid unintended weight gain.  - I encouraged the patient to switch to  unprocessed or minimally processed complex starch and increased protein intake (animal or plant source), fruits, and vegetables.  - Patient is advised to stick to a routine mealtimes to eat 3 meals  a day and avoid unnecessary snacks ( to snack only to correct hypoglycemia).  - The patient will be scheduled with Jearld Fenton, RDN, CDE for individualized DM education.  - I have approached patient with the following individualized plan to manage  diabetes and  patient agrees:   - She has normal renal function. -I will proceed  with Lantus  40 units daily at bedtime and  continue to hold off prandial insulin Humalog for now .    - I advised her to continue monitoring of blood glucose before meals breakfast and at bedtime.  - Patient is warned not to take insulin without proper monitoring per orders. -Adjustment parameters are given for hypo and hyperglycemia in writing. -Patient is encouraged to call clinic for blood glucose levels less than 70 or above 200 mg /dl. -She mentions allergy to a number of use for medications unfortunately, Victoza, Byetta. - She is  tolerating  metformin , I will continue  metformin 500 mg by mouth twice a day. -She will continue Invokana 100 mg by mouth every morning. -Side effects and precautions discussed with hershe was treated for yeast infection with Diflucan once after her last visit. - Patient specific target  A1c;  LDL, HDL, Triglycerides, and  Waist Circumference were discussed in detail.  2) BP/HTN: Controlled. Continue current medications . 3) Lipids/HPL:  Uncontrolled with triglycerides of 267 improving from  902, continue  Lipitor 40 mg by mouth daily at bedtime. Better control of diabetes will help for triglycerides. 4)  Weight/Diet: CDE Consult in progress  , exercise, and detailed carbohydrates information provided.  5) Chronic Care/Health Maintenance:  -Patient is on Statin medications and encouraged to continue to follow up with Ophthalmology, Podiatrist at least yearly or according to recommendations, and advised to   stay away from smoking. I have recommended yearly flu vaccine and pneumonia vaccination at least every 5 years; moderate intensity exercise for up to 150 minutes weekly; and  sleep for at least 7 hours a day.  - 30 minutes of time was spent on the care of this patient , 50% of which was applied for counseling on diabetes complications and their preventions.  - Patient to  bring meter and  blood glucose logs during her next visit.   - I advised patient to maintain close follow up with Glenda Chroman, MD for primary care needs.  Follow up plan: - Return in about 3 months (around 11/07/2016) for follow up with pre-visit labs, meter, and logs.  Glade Lloyd, MD Phone: [phone]  Fax: [phone]   08/07/2016, 1:37 PM

## 2016-08-12 ENCOUNTER — Ambulatory Visit: Payer: Medicaid Other | Admitting: "Endocrinology

## 2016-08-20 ENCOUNTER — Encounter: Payer: Self-pay | Admitting: *Deleted

## 2016-08-20 ENCOUNTER — Ambulatory Visit (INDEPENDENT_AMBULATORY_CARE_PROVIDER_SITE_OTHER): Payer: Medicaid Other | Admitting: Adult Health

## 2016-08-20 ENCOUNTER — Encounter: Payer: Self-pay | Admitting: Adult Health

## 2016-08-20 VITALS — BP 150/72 | HR 88 | Ht 64.0 in | Wt 225.0 lb

## 2016-08-20 DIAGNOSIS — R079 Chest pain, unspecified: Secondary | ICD-10-CM | POA: Diagnosis not present

## 2016-08-20 DIAGNOSIS — I10 Essential (primary) hypertension: Secondary | ICD-10-CM

## 2016-08-20 DIAGNOSIS — I25119 Atherosclerotic heart disease of native coronary artery with unspecified angina pectoris: Secondary | ICD-10-CM

## 2016-08-20 MED ORDER — METOPROLOL SUCCINATE ER 50 MG PO TB24: ORAL_TABLET | ORAL | 1 refills | Status: DC

## 2016-08-20 NOTE — Patient Instructions (Addendum)
Your physician recommends that you schedule a follow-up appointment in: Angie has recommended you make the following change in your medication:   INCREASE TOPROL XL 75 MG (1 AND 1/2 TABLETS) DAILY   Your physician has requested that you have a lexiscan myoview. For further information please visit [email or web address]. Please follow instruction sheet, as given.  Thank you for choosing [facility]!!

## 2016-08-20 NOTE — Progress Notes (Signed)
Cardiology Office Note   Date:  08/20/2016   ID:  [name], DOB 1950-07-04, MRN [id number]  PCP:  Emily Chroman, MD  Cardiologist:  Bronson Ing  Chief Complaint  Patient presents with  . Coronary Artery Disease  . Chest Pain      History of Present Illness: [name] is a 66 y.o. female who presents for ongoing assessment and management of coronary artery disease, with PCI of the circumflex in 0865, chronic diastolic heart failure, tachycardia and palpitations, dyslipidemia, with other history to include insulin-dependent diabetes, obstructive sleep apnea, irritable bowel syndrome, and GERD.  The patient was last seen by. Dr. Bronson Ing on 10/26/2015, was stable from a cardiac standpoint. At that time the patient and taken off of ACE inhibitor by primary care physician, it was recommended that did be reinitiated in the setting of diabetes and CAD.Marland Kitchen   She comes today with complaints of chest pressure, noticing more when lying supine, sometimes while sitting up with no associated shortness of breath diaphoresis or dizziness area the pain is not similar to the pain she had prior to stent placement in the past. She described that his severe discomfort. The patient has been taking nitroglycerin sublingual almost every day, which has been helpful for relief of chest discomfort. Because of recurrence of discomfort she wished to be seen sooner than previously scheduled appointment.   Past Medical History:  Diagnosis Date  . Anxiety   . Anxiety and depression   . Arthritis   . CAD (coronary artery disease)    Stent placement circumflex coronary 2007, catheterization 2008 patent stents. Normal LV function  . Chest pain   . CHF (congestive heart failure) ([place])   . COPD (chronic obstructive pulmonary disease) ([place])   . Depression   . Diabetes mellitus    Insulin dependent  . Diabetic polyneuropathy (HCC)     severe on multiple medications  . Dyslipidemia   . GERD (gastroesophageal  reflux disease)   . Headache(784.0)   . 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 MI (myocardial infarction) ([place])    2007  . Obstructive sleep apnea    CPAP machine at night  . Palpitations   . Tachycardia     Past Surgical History:  Procedure Laterality Date  . ABDOMINAL HYSTERECTOMY    . Arm Surgery Bilateral    due to fall-pt broke both forearms  . BIOPSY N/A 07/01/2013   Procedure: BIOPSY;  Surgeon: Rogene Houston, MD;  Location: [facility];  Service: Endoscopy;  Laterality: N/A;  . CHOLECYSTECTOMY    . COLONOSCOPY  08/06/2011   Procedure: COLONOSCOPY;  Surgeon: Rogene Houston, MD;  [facility];  Service: Endoscopy;  Laterality: N/A;  215  . COLONOSCOPY WITH PROPOFOL N/A 12/31/2012   Procedure: COLONOSCOPY WITH PROPOFOL;  Surgeon: Rogene Houston, MD;  Location: [facility];  Service: Endoscopy;  Laterality: N/A;  in cecum at 0814, total withdrawal time 80min  . CORONARY ANGIOPLASTY WITH STENT PLACEMENT    . ESOPHAGEAL DILATION N/A 06/07/2015   Procedure: ESOPHAGEAL DILATION;  Surgeon: Rogene Houston, MD;  [facility];  Service: Endoscopy;  Laterality: N/A;  . ESOPHAGOGASTRODUODENOSCOPY N/A 06/07/2015   Procedure: ESOPHAGOGASTRODUODENOSCOPY (EGD);  Surgeon: Rogene Houston, MD;  [facility];  Service: Endoscopy;  Laterality: N/A;  2:45 - moved to 1:00 - Ann to notify pt  . ESOPHAGOGASTRODUODENOSCOPY (EGD) WITH PROPOFOL N/A 12/31/2012   Procedure: ESOPHAGOGASTRODUODENOSCOPY (EGD) WITH PROPOFOL;  Surgeon: Rogene Houston,  MD;  [facility]: [facility];  Service: Endoscopy;  Laterality: N/A;  GE junction 37,  . ESOPHAGOGASTRODUODENOSCOPY (EGD) WITH PROPOFOL N/A 07/01/2013   Procedure: ESOPHAGOGASTRODUODENOSCOPY (EGD) WITH PROPOFOL;  Surgeon: Rogene Houston, MD;  [facility]: [facility];  Service: Endoscopy;  Laterality: N/A;  950  . MALONEY DILATION N/A 12/31/2012   Procedure: MALONEY DILATION;  Surgeon: Rogene Houston, MD;  Location: [facility];  Service: Endoscopy;  Laterality: N/A;  used  a # 54,#56  . MALONEY DILATION N/A 07/01/2013   Procedure: Venia Minks DILATION;  Surgeon: Rogene Houston, MD;  Location: [facility];  Service: Endoscopy;  Laterality: N/A;  54/58; no heme present     Current Outpatient Prescriptions  Medication Sig Dispense Refill  . ACCU-CHEK AVIVA PLUS test strip USE TO CHECK BLOOD SUGAR THREE TIMES DAILY - DX: E11.9 100 each 5  . ALPRAZolam (XANAX) 0.5 MG tablet Take 1 tablet (0.5 mg total) by mouth 3 (three) times daily as needed for anxiety. 90 tablet 0  . aspirin EC 81 MG tablet Take 81 mg by mouth daily.    Marland Kitchen atorvastatin (LIPITOR) 40 MG tablet Take 40 mg by mouth daily.    . beclomethasone (QVAR) 40 MCG/ACT inhaler Inhale 1 puff into the lungs 2 (two) times daily.    Marland Kitchen buPROPion (WELLBUTRIN SR) 100 MG 12 hr tablet Take 100 mg by mouth daily.    . citalopram (CELEXA) 40 MG tablet Take 40 mg by mouth daily.    Marland Kitchen dicyclomine (BENTYL) 10 MG capsule TAKE 1 CAPSULE BY MOUTH TWICE DAILY AS NEEDED FOR SPASMS. 60 capsule 5  . estrogens, conjugated, (PREMARIN) 1.25 MG tablet Take 1.25 mg by mouth daily.     . fluticasone-salmeterol (ADVAIR HFA) 115-21 MCG/ACT inhaler Inhale 2 puffs into the lungs 2 (two) times daily.    Marland Kitchen gabapentin (NEURONTIN) 100 MG capsule Take 1 capsule (100 mg total) by mouth 3 (three) times daily. 90 capsule 6  . GLOBAL EASE INJECT PEN NEEDLES 31G X 8 MM MISC USE AS DIRECTED FOUR TIMES DAILY 100 each 3  . ibuprofen (ADVIL,MOTRIN) 200 MG tablet Take 200 mg by mouth daily as needed for headache.    . Insulin Glargine (LANTUS SOLOSTAR) 100 UNIT/ML Solostar Pen Inject 50 Units into the skin at bedtime. 15 mL 3  . INVOKANA 100 MG TABS tablet TAKE 1 TABLET BY MOUTH DAILY BEFORE BREAKFAST 30 tablet 2  . lidocaine (XYLOCAINE) 5 % ointment Apply 1 application topically at bedtime.     . metFORMIN (GLUCOPHAGE) 500 MG tablet TAKE ONE TABLET BY MOUTH TWICE DAILY WITH A MEAL 60 tablet 2  . methocarbamol (ROBAXIN) 500 MG tablet Take 1 tablet (500 mg total)  by mouth 3 (three) times daily. 45 tablet 0  . morphine (MS CONTIN) 30 MG 12 hr tablet Take 1 tablet (30 mg total) by mouth every 8 (eight) hours. 90 tablet 0  . nitroGLYCERIN (NITROSTAT) 0.4 MG SL tablet Place 1 tablet (0.4 mg total) under the tongue every 5 (five) minutes x 3 doses as needed. For chest pains 25 tablet 3  . ondansetron (ZOFRAN) 4 MG tablet Take 4 mg by mouth every 6 (six) hours as needed for nausea or vomiting.    . pantoprazole (PROTONIX) 40 MG tablet TAKE 1 TABLET BY MOUTH TWICE DAILY BEFORE MEALS 60 tablet 5  . psyllium (METAMUCIL SMOOTH TEXTURE) 28 % packet Take 1 packet by mouth at bedtime.    . traZODone (DESYREL) 25 mg TABS tablet Take 0.5  tablets (25 mg total) by mouth at bedtime. (Patient taking differently: Take 50 mg by mouth at bedtime. ) 30 tablet 2  . VOLTAREN 1 % GEL APPLY 2 GRAMS ON BOTH KNEES THREE TIMES DAILY (ONLY FILL IF SHE ASKS) 300 g 2  . metoprolol succinate (TOPROL-XL) 50 MG 24 hr tablet TAKE 1 AND 1/2 TABLETS DAILY 75MG  135 tablet 1   No current facility-administered medications for this visit.     Allergies:   Nitrofuran derivatives; Amphetamine-dextroamphet er; Pregabalin; Topiramate; and Verelan [verapamil]    Social History:  The patient  reports that she has never smoked. She has never used smokeless tobacco. She reports that she does not drink alcohol or use drugs.   Family History:  The patient's family history includes Cancer in her brother, brother, maternal aunt, maternal uncle, paternal aunt, and paternal uncle; Colon cancer in her mother; Heart attack in her father and mother; Hemophilia in her child; Stroke in her sister.    ROS: All other systems are reviewed and negative. Unless otherwise mentioned in H&P    PHYSICAL EXAM: VS:  BP (!) 150/72   Pulse 88   Ht 5\' 4"  (1.626 m)   Wt 225 lb (102.1 kg)   SpO2 98%   BMI 38.62 kg/m  , BMI Body mass index is 38.62 kg/m. GEN: Well nourished, well developed, in no acute distress  Obese. HEENT: normal  Neck: no JVD, carotid bruits, or masses Cardiac: RRR; Tachycardic, no murmurs, rubs, or gallops,no edema  Respiratory:  clear to auscultation bilaterally, normal work of breathing GI: soft, nontender, nondistended, + BS MS: no deformity or atrophy  Skin: warm and dry, no rash Neuro:  Strength and sensation are intact Psych: euthymic mood, full affect   EKG:The ekg ordered today demonstrates sinus tachycardia heart rate of 101 bpm.   Recent Labs: 04/15/2016: TSH 3.070 07/31/2016: ALT 9; BUN 14; Creatinine, Ser 0.70; Potassium 4.6; Sodium 141    Lipid Panel    Component Value Date/Time   CHOL 184 04/15/2016 1121   TRIG 284 (H) 04/15/2016 1121   HDL 77 04/15/2016 1121   CHOLHDL 2.7 07/05/2015 0937   VLDL 53 (H) 07/05/2015 0937   LDLCALC 50 04/15/2016 1121      Wt Readings from Last 3 Encounters:  08/20/16 225 lb (102.1 kg)  08/07/16 224 lb (101.6 kg)  05/14/16 228 lb (103.4 kg)      Other studies Reviewed: Echocardiogram Mar 09, 2013 Study data: Technically adequate study. - Left ventricle: The cavity size was normal. Wall thickness was increased in a pattern of mild LVH. Systolic function was normal. The estimated ejection fraction was in the range of 60% to 65%. Doppler parameters are consistent with abnormal left ventricular relaxation (grade 1 diastolic dysfunction). - Aortic valve: Valve area: 2.49cm^2(VTI). Valve area: 2.43cm^2 (Vmax). - Mitral valve: Calcified annulus. Mildly thickened leaflets .Right ventricle: The cavity size was mildly dilated.  ASSESSMENT AND PLAN:  1. Coronary artery disease: Known history of PCI to the circumflex in 2007. Having recurrent chest discomfort describing it as chest pressure with mild dyspnea usually occurring when lying supine. The patient has chronic pain and fatigue and cannot tell that her energy level has changed much. She is taking nitroglycerin once or twice a day on a daily basis for  her discomfort. In the setting of multiple plan her for a Lexiscan nuclear stress Myoview. She is unable to walk on a treadmill due to chronic back pain and leg pain. No medications will be  held prior to test.  2. Hypertension: Elevated today, along with tachycardia. She states that she is felt her heart rate being faster than normal. Blood pressure has been rising somewhat. She is not certain if this is related to pain or anxiety. I will increase her metoprolol from 50 mg daily to 75 mg daily.  3. Hypercholesterolemia: Continue Lipitor daily. Labs are followed by endocrinologist.  4. Diabetes: The followed closely by endocrinologist.  Current medicines are reviewed at length with the patient today.    Labs/ tests ordered today include:   Phill Myron. West Pugh, ANP, AACC   08/20/2016 12:46 PM     [facility] [address] [place], [place] [address] Phone: [phone]; Fax: [phone]

## 2016-08-25 ENCOUNTER — Encounter: Payer: Medicaid Other | Attending: Physical Medicine & Rehabilitation | Admitting: Registered Nurse

## 2016-08-25 ENCOUNTER — Encounter: Payer: Self-pay | Admitting: Registered Nurse

## 2016-08-25 VITALS — BP 116/65 | HR 71 | Resp 14

## 2016-08-25 DIAGNOSIS — G8929 Other chronic pain: Secondary | ICD-10-CM | POA: Insufficient documentation

## 2016-08-25 DIAGNOSIS — M79605 Pain in left leg: Secondary | ICD-10-CM | POA: Diagnosis not present

## 2016-08-25 DIAGNOSIS — M7061 Trochanteric bursitis, right hip: Secondary | ICD-10-CM | POA: Diagnosis not present

## 2016-08-25 DIAGNOSIS — F411 Generalized anxiety disorder: Secondary | ICD-10-CM

## 2016-08-25 DIAGNOSIS — I252 Old myocardial infarction: Secondary | ICD-10-CM | POA: Insufficient documentation

## 2016-08-25 DIAGNOSIS — M1990 Unspecified osteoarthritis, unspecified site: Secondary | ICD-10-CM | POA: Diagnosis not present

## 2016-08-25 DIAGNOSIS — R51 Headache: Secondary | ICD-10-CM | POA: Insufficient documentation

## 2016-08-25 DIAGNOSIS — F418 Other specified anxiety disorders: Secondary | ICD-10-CM | POA: Diagnosis not present

## 2016-08-25 DIAGNOSIS — G4700 Insomnia, unspecified: Secondary | ICD-10-CM | POA: Insufficient documentation

## 2016-08-25 DIAGNOSIS — M47896 Other spondylosis, lumbar region: Secondary | ICD-10-CM | POA: Diagnosis not present

## 2016-08-25 DIAGNOSIS — M5489 Other dorsalgia: Secondary | ICD-10-CM | POA: Insufficient documentation

## 2016-08-25 DIAGNOSIS — E785 Hyperlipidemia, unspecified: Secondary | ICD-10-CM | POA: Insufficient documentation

## 2016-08-25 DIAGNOSIS — F3289 Other specified depressive episodes: Secondary | ICD-10-CM

## 2016-08-25 DIAGNOSIS — Z760 Encounter for issue of repeat prescription: Secondary | ICD-10-CM | POA: Insufficient documentation

## 2016-08-25 DIAGNOSIS — R002 Palpitations: Secondary | ICD-10-CM | POA: Diagnosis not present

## 2016-08-25 DIAGNOSIS — I2510 Atherosclerotic heart disease of native coronary artery without angina pectoris: Secondary | ICD-10-CM | POA: Diagnosis not present

## 2016-08-25 DIAGNOSIS — K219 Gastro-esophageal reflux disease without esophagitis: Secondary | ICD-10-CM | POA: Insufficient documentation

## 2016-08-25 DIAGNOSIS — I509 Heart failure, unspecified: Secondary | ICD-10-CM | POA: Insufficient documentation

## 2016-08-25 DIAGNOSIS — J449 Chronic obstructive pulmonary disease, unspecified: Secondary | ICD-10-CM | POA: Insufficient documentation

## 2016-08-25 DIAGNOSIS — R000 Tachycardia, unspecified: Secondary | ICD-10-CM | POA: Insufficient documentation

## 2016-08-25 DIAGNOSIS — Z1401 Asymptomatic hemophilia A carrier: Secondary | ICD-10-CM | POA: Insufficient documentation

## 2016-08-25 DIAGNOSIS — Z794 Long term (current) use of insulin: Secondary | ICD-10-CM | POA: Diagnosis not present

## 2016-08-25 DIAGNOSIS — G4733 Obstructive sleep apnea (adult) (pediatric): Secondary | ICD-10-CM | POA: Insufficient documentation

## 2016-08-25 DIAGNOSIS — M79604 Pain in right leg: Secondary | ICD-10-CM | POA: Insufficient documentation

## 2016-08-25 DIAGNOSIS — M47816 Spondylosis without myelopathy or radiculopathy, lumbar region: Secondary | ICD-10-CM | POA: Diagnosis not present

## 2016-08-25 DIAGNOSIS — M5416 Radiculopathy, lumbar region: Secondary | ICD-10-CM

## 2016-08-25 DIAGNOSIS — E1142 Type 2 diabetes mellitus with diabetic polyneuropathy: Secondary | ICD-10-CM | POA: Insufficient documentation

## 2016-08-25 DIAGNOSIS — G894 Chronic pain syndrome: Secondary | ICD-10-CM | POA: Diagnosis not present

## 2016-08-25 DIAGNOSIS — R079 Chest pain, unspecified: Secondary | ICD-10-CM | POA: Insufficient documentation

## 2016-08-25 MED ORDER — DICLOFENAC SODIUM 1 % TD GEL: TRANSDERMAL | 2 refills | Status: DC

## 2016-08-25 MED ORDER — ALPRAZOLAM 0.5 MG PO TABS: 0.5000 mg | ORAL_TABLET | Freq: Three times a day (TID) | ORAL | 2 refills | Status: DC | PRN

## 2016-08-25 MED ORDER — MORPHINE SULFATE ER 30 MG PO TBCR: 30.0000 mg | EXTENDED_RELEASE_TABLET | Freq: Three times a day (TID) | ORAL | 0 refills | Status: DC

## 2016-08-25 NOTE — Progress Notes (Signed)
Subjective:    Patient ID: [name], female    DOB: 1950-10-30, 66 y.o.   MRN: [id number]  HPI: [name] is a 66year old female who returns for follow up appointmentfor chronic pain and medication refill. She states her pain is located in her neck,bilateral shoulders,lowerback radiating into herbilateral lower extremities, bilateralkneesand bilateral feet. She rates her pain 7. Her current exercise regime is performing chair exercises and walking short distances with her walker.   [name] has been slowly weaned, with the hope beng weaned off and prescribed Cymbalta. She is on Citalopram 40 mg and Tramadol, with the possibility of  Serotonin Syndrome, we will continue with the current Xanax dose of 0.5 mg TID, this was discussed with Dr. Letta Pate  he agrees with plan.  [name] states she has been on Xanax  > 40 years.   Last UDS was on 06/30/2016 it was consistent.    Pain Inventory Average Pain 8 Pain Right Now 7 My pain is sharp, burning, dull, stabbing, tingling and aching  In the last 24 hours, has pain interfered with the following? General activity 8 Relation with others 8 Enjoyment of life 8 What TIME of day is your pain at its worst? morning,night Sleep (in general) Poor  Pain is worse with: walking, bending, sitting, inactivity, standing and some activites Pain improves with: heat/ice and medication Relief from Meds: 3  Mobility walk with assistance use a walker how many minutes can you walk? 5 ability to climb steps?  no do you drive?  no Do you have any goals in this area?  no  Function I need assistance with the following:  dressing, household duties and shopping Do you have any goals in this area?  no  Neuro/Psych bladder control problems weakness numbness tingling dizziness confusion depression anxiety  Prior Studies Any changes since last visit?  no  Physicians involved in your care Any changes since last  visit?  no   Family History  Problem Relation Age of Onset  . Heart attack Mother   . Colon cancer Mother   . Heart attack Father   . Stroke Sister   . Hemophilia Child   . Cancer Unknown   . Coronary artery disease Unknown   . Cancer Brother   . Cancer Maternal Aunt   . Cancer Maternal Uncle   . Cancer Paternal Aunt   . Cancer Paternal Uncle   . Cancer Brother    Social History   Social History  . Marital status: Legally Separated    Spouse name: N/A  . Number of children: 4  . Years of education: N/A   Occupational History  . Disabled    Social History Main Topics  . Smoking status: Never Smoker  . Smokeless tobacco: Never Used  . Alcohol use No  . Drug use: No  . Sexual activity: Not Asked   Other Topics Concern  . None   Social History Narrative   Patient does not get regular exercise   Denies caffeine use    Past Surgical History:  Procedure Laterality Date  . ABDOMINAL HYSTERECTOMY    . Arm Surgery Bilateral    due to fall-pt broke both forearms  . BIOPSY N/A 07/01/2013   Procedure: BIOPSY;  Surgeon: Rogene Houston, MD;  [facility];  Service: Endoscopy;  Laterality: N/A;  . CHOLECYSTECTOMY    . COLONOSCOPY  08/06/2011   Procedure: COLONOSCOPY;  Surgeon: Rogene Houston, MD;  [facility];  Service: Endoscopy;  Laterality: N/A;  215  . COLONOSCOPY WITH PROPOFOL N/A 12/31/2012   Procedure: COLONOSCOPY WITH PROPOFOL;  Surgeon: Rogene Houston, MD;  Location: [facility];  Service: Endoscopy;  Laterality: N/A;  in cecum at 0814, total withdrawal time 70min  . CORONARY ANGIOPLASTY WITH STENT PLACEMENT    . ESOPHAGEAL DILATION N/A 06/07/2015   Procedure: ESOPHAGEAL DILATION;  Surgeon: Rogene Houston, MD;  [facility];  Service: Endoscopy;  Laterality: N/A;  . ESOPHAGOGASTRODUODENOSCOPY N/A 06/07/2015   Procedure: ESOPHAGOGASTRODUODENOSCOPY (EGD);  Surgeon: Rogene Houston, MD;  [facility];  Service: Endoscopy;  Laterality:  N/A;  2:45 - moved to 1:00 - Ann to notify pt  . ESOPHAGOGASTRODUODENOSCOPY (EGD) WITH PROPOFOL N/A 12/31/2012   Procedure: ESOPHAGOGASTRODUODENOSCOPY (EGD) WITH PROPOFOL;  Surgeon: Rogene Houston, MD;  [facility];  Service: Endoscopy;  Laterality: N/A;  GE junction 37,  . ESOPHAGOGASTRODUODENOSCOPY (EGD) WITH PROPOFOL N/A 07/01/2013   Procedure: ESOPHAGOGASTRODUODENOSCOPY (EGD) WITH PROPOFOL;  Surgeon: Rogene Houston, MD;  [facility];  Service: Endoscopy;  Laterality: N/A;  950  . MALONEY DILATION N/A 12/31/2012   Procedure: MALONEY DILATION;  Surgeon: Rogene Houston, MD;  Location: [facility];  Service: Endoscopy;  Laterality: N/A;  used a # 54,#56  . MALONEY DILATION N/A 07/01/2013   Procedure: Venia Minks DILATION;  Surgeon: Rogene Houston, MD;  Location[facility];  Service: Endoscopy;  Laterality: N/A;  54/58; no heme present   Past Medical History:  Diagnosis Date  . Anxiety   . Anxiety and depression   . Arthritis   . CAD (coronary artery disease)    Stent placement circumflex coronary 2007, catheterization 2008 patent stents. Normal LV function  . Chest pain   . CHF (congestive heart failure) ([place])   . COPD (chronic obstructive pulmonary disease) ([place])   . Depression   . Diabetes mellitus    Insulin dependent  . Diabetic polyneuropathy (HCC)     severe on multiple medications  . Dyslipidemia   . GERD (gastroesophageal reflux disease)   . Headache(784.0)   . 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 MI (myocardial infarction) ([place])    2007  . Obstructive sleep apnea    CPAP machine at night  . Palpitations   . Tachycardia    BP 116/65 (BP Location: Right Arm, Patient Position: Sitting, Cuff Size: Large)   Pulse 71   Resp 14   SpO2 94%   Opioid Risk Score:   Fall Risk Score:  `1  Depression screen PHQ 2/9  Depression screen [facility] 2/9 08/07/2016 05/14/2016 10/11/2015 07/12/2015 07/12/2015 05/14/2015 05/14/2015  Decreased Interest 0 0 0 0 0 0 0  Down, Depressed, Hopeless 0 0 0 0 0 0 0    PHQ - 2 Score 0 0 0 0 0 0 0  Altered sleeping - - - - - - -  Tired, decreased energy - - - - - - -  Change in appetite - - - - - - -  Feeling bad or failure about yourself  - - - - - - -  Trouble concentrating - - - - - - -  Moving slowly or fidgety/restless - - - - - - -  Suicidal thoughts - - - - - - -  PHQ-9 Score - - - - - - -  Some recent data might be hidden    Review of Systems  HENT: Negative.   Eyes: Negative.  Respiratory: Positive for apnea.   Cardiovascular: Negative.   Gastrointestinal: Positive for constipation, diarrhea and nausea.  Endocrine:       Diabetes  Genitourinary: Positive for dysuria.  Musculoskeletal: Positive for arthralgias, back pain, gait problem, myalgias and neck pain.  Skin: Negative.   Neurological: Positive for dizziness, weakness and numbness.       Tingling   Psychiatric/Behavioral: Positive for confusion and dysphoric mood. The patient is nervous/anxious.   All other systems reviewed and are negative.      Objective:   Physical Exam  Constitutional: She is oriented to person, place, and time. She appears well-developed and well-nourished.  HENT:  Head: Normocephalic and atraumatic.  Neck: Normal range of motion. Neck supple.  Cardiovascular: Normal rate and regular rhythm.   Pulmonary/Chest: Effort normal and breath sounds normal.  Musculoskeletal:  Normal Muscle Bulk and Muscle Testing Reveals: Upper Extremities: Full ROM and Muscle Strength 5/5 Thoracic Hypersensitivity Lumbar Paraspinal Tenderness: L-3-L-5 Lower Extremities: Full ROM and Muscle Strength 5/5 Bilateral Lower Extremities Flexion Produces Pain into Lumbar and bilateral Lower Extremities Arises from Table slowly using walker for support Antalgic Gait   Neurological: She is alert and oriented to person, place, and time.  Skin: Skin is warm and dry.  Psychiatric: She has a normal mood and affect.  Nursing note and vitals reviewed.         Assessment &  Plan:  1.Lumbar pain lumbar spondylosis: Encouraged to increase Activity as tolerated. 08/25/2016 Refilled: MS Contin 30 MG one tablet every 8 hours #90 and ContinueTramadol 50 mg one tablet every 6 hours as needed #120.  We will continue the opioid monitoring program, this consists of regular clinic visits, examinations, urine drug screen, pill counts as well as use of [place] Controlled Substance Reporting System. 2. Lumbar Radiculitis: Continue Gabapentin. 08/25/2016 3. Bilateral Knee Pain/ Degenerative: Continue Voltaren Gel. Continue to Monitor. 08/25/2016. 4. Severe diabetic poly neuropathy: Continue Gabapentin. 08/25/2016 5. Insomnia: Continue Trazodone PCP Following. 08/25/2016  6. Anxiety: Continue  Xanax 0.5 mg TID. 08/25/2016  20 minutes of face to face patient care time was spent during this visit. All questions were encouraged and answered.   F/U in 1 month

## 2016-08-26 ENCOUNTER — Other Ambulatory Visit: Payer: Self-pay | Admitting: "Endocrinology

## 2016-08-29 ENCOUNTER — Encounter (HOSPITAL_COMMUNITY): Payer: Self-pay

## 2016-08-29 ENCOUNTER — Ambulatory Visit (HOSPITAL_COMMUNITY)
Admission: RE | Admit: 2016-08-29 | Discharge: 2016-08-29 | Disposition: A | Discharge status: Home / Self Care | Payer: Medicaid Other | Source: Ambulatory Visit | Attending: Adult Health | Admitting: Adult Health

## 2016-08-29 ENCOUNTER — Encounter (HOSPITAL_COMMUNITY)
Admission: RE | Admit: 2016-08-29 | Discharge: 2016-08-29 | Disposition: A | Discharge status: Home / Self Care | Payer: Medicaid Other | Source: Ambulatory Visit | Attending: Adult Health | Admitting: Adult Health

## 2016-08-29 DIAGNOSIS — R079 Chest pain, unspecified: Secondary | ICD-10-CM | POA: Diagnosis not present

## 2016-08-29 HISTORY — DX: Essential (primary) hypertension: I10

## 2016-08-29 LAB — NM MYOCAR MULTI W/SPECT W/WALL MOTION / EF
LV dias vol: 85 mL (ref 46–106)
LV sys vol: 27 mL
Peak HR: 86 {beats}/min
RATE: 0.15
Rest HR: 58 {beats}/min
SDS: 1
SRS: 0
SSS: 1
TID: 0.99

## 2016-08-29 MED ORDER — TECHNETIUM TC 99M TETROFOSMIN IV KIT
30.0000 | PACK | Freq: Once | INTRAVENOUS | Status: AC | PRN
  Administered 2016-08-29: 30.4 via INTRAVENOUS

## 2016-08-29 MED ORDER — SODIUM CHLORIDE 0.9% FLUSH
INTRAVENOUS | Status: AC
Start: 2016-08-29 — End: 2016-08-29
  Administered 2016-08-29: 10 mL via INTRAVENOUS
  Filled 2016-08-29: qty 10

## 2016-08-29 MED ORDER — REGADENOSON 0.4 MG/5ML IV SOLN
INTRAVENOUS | Status: AC
  Administered 2016-08-29: 0.4 mg via INTRAVENOUS
  Filled 2016-08-29: qty 5

## 2016-08-29 MED ORDER — TECHNETIUM TC 99M TETROFOSMIN IV KIT
10.0000 | PACK | Freq: Once | INTRAVENOUS | Status: AC | PRN
  Administered 2016-08-29: 10.8 via INTRAVENOUS

## 2016-09-12 ENCOUNTER — Encounter: Payer: Self-pay | Admitting: Cardiovascular Disease

## 2016-09-12 ENCOUNTER — Ambulatory Visit: Payer: Medicaid Other | Admitting: Cardiovascular Disease

## 2016-09-29 ENCOUNTER — Ambulatory Visit (HOSPITAL_BASED_OUTPATIENT_CLINIC_OR_DEPARTMENT_OTHER): Payer: Medicaid Other | Admitting: Physical Medicine & Rehabilitation

## 2016-09-29 ENCOUNTER — Encounter: Payer: Self-pay | Admitting: Physical Medicine & Rehabilitation

## 2016-09-29 ENCOUNTER — Encounter: Payer: Medicaid Other | Attending: Physical Medicine & Rehabilitation

## 2016-09-29 VITALS — BP 127/74 | HR 85

## 2016-09-29 DIAGNOSIS — I252 Old myocardial infarction: Secondary | ICD-10-CM | POA: Insufficient documentation

## 2016-09-29 DIAGNOSIS — M47816 Spondylosis without myelopathy or radiculopathy, lumbar region: Secondary | ICD-10-CM | POA: Diagnosis not present

## 2016-09-29 DIAGNOSIS — R000 Tachycardia, unspecified: Secondary | ICD-10-CM | POA: Insufficient documentation

## 2016-09-29 DIAGNOSIS — E785 Hyperlipidemia, unspecified: Secondary | ICD-10-CM | POA: Diagnosis not present

## 2016-09-29 DIAGNOSIS — M47896 Other spondylosis, lumbar region: Secondary | ICD-10-CM | POA: Insufficient documentation

## 2016-09-29 DIAGNOSIS — Z760 Encounter for issue of repeat prescription: Secondary | ICD-10-CM | POA: Diagnosis present

## 2016-09-29 DIAGNOSIS — Z794 Long term (current) use of insulin: Secondary | ICD-10-CM | POA: Insufficient documentation

## 2016-09-29 DIAGNOSIS — I509 Heart failure, unspecified: Secondary | ICD-10-CM | POA: Insufficient documentation

## 2016-09-29 DIAGNOSIS — R51 Headache: Secondary | ICD-10-CM | POA: Insufficient documentation

## 2016-09-29 DIAGNOSIS — M1990 Unspecified osteoarthritis, unspecified site: Secondary | ICD-10-CM | POA: Insufficient documentation

## 2016-09-29 DIAGNOSIS — E1142 Type 2 diabetes mellitus with diabetic polyneuropathy: Secondary | ICD-10-CM

## 2016-09-29 DIAGNOSIS — G4733 Obstructive sleep apnea (adult) (pediatric): Secondary | ICD-10-CM | POA: Diagnosis not present

## 2016-09-29 DIAGNOSIS — G4700 Insomnia, unspecified: Secondary | ICD-10-CM | POA: Insufficient documentation

## 2016-09-29 DIAGNOSIS — Z1401 Asymptomatic hemophilia A carrier: Secondary | ICD-10-CM | POA: Insufficient documentation

## 2016-09-29 DIAGNOSIS — J449 Chronic obstructive pulmonary disease, unspecified: Secondary | ICD-10-CM | POA: Insufficient documentation

## 2016-09-29 DIAGNOSIS — M5489 Other dorsalgia: Secondary | ICD-10-CM | POA: Insufficient documentation

## 2016-09-29 DIAGNOSIS — F418 Other specified anxiety disorders: Secondary | ICD-10-CM | POA: Insufficient documentation

## 2016-09-29 DIAGNOSIS — K219 Gastro-esophageal reflux disease without esophagitis: Secondary | ICD-10-CM | POA: Insufficient documentation

## 2016-09-29 DIAGNOSIS — R079 Chest pain, unspecified: Secondary | ICD-10-CM | POA: Diagnosis not present

## 2016-09-29 DIAGNOSIS — G8929 Other chronic pain: Secondary | ICD-10-CM | POA: Insufficient documentation

## 2016-09-29 DIAGNOSIS — I2510 Atherosclerotic heart disease of native coronary artery without angina pectoris: Secondary | ICD-10-CM | POA: Insufficient documentation

## 2016-09-29 DIAGNOSIS — R002 Palpitations: Secondary | ICD-10-CM | POA: Insufficient documentation

## 2016-09-29 DIAGNOSIS — M79605 Pain in left leg: Secondary | ICD-10-CM | POA: Diagnosis not present

## 2016-09-29 DIAGNOSIS — M79604 Pain in right leg: Secondary | ICD-10-CM | POA: Diagnosis not present

## 2016-09-29 MED ORDER — DULOXETINE HCL 20 MG PO CPEP: 20.0000 mg | ORAL_CAPSULE | Freq: Every day | ORAL | 0 refills | Status: DC

## 2016-09-29 MED ORDER — MORPHINE SULFATE ER 30 MG PO TBCR: 30.0000 mg | EXTENDED_RELEASE_TABLET | Freq: Three times a day (TID) | ORAL | 0 refills | Status: DC

## 2016-09-29 NOTE — Progress Notes (Signed)
Subjective:    Patient ID: [name], female    DOB: 1951-01-08, 66 y.o.   MRN: [id number]  HPI 57 oh female with painful diabetic neuropathy as well as chronic low back and chronic neck pain.  Patient is no longer on Celexa. She is also off tramadol. MS Contin steady at 30 mg 3 times per day Discussed goal of staying below 100 mg of morphine per day Discussed goal of continuing weaning off Xanax but first we will start Cymbalta  Pain Inventory Average Pain 8 Pain Right Now 7 My pain is sharp, burning, dull, stabbing, tingling and aching  In the last 24 hours, has pain interfered with the following? General activity 9 Relation with others 8 Enjoyment of life 8 What TIME of day is your pain at its worst? night Sleep (in general) Poor  Pain is worse with: walking, bending, sitting, standing and some activites Pain improves with: rest and medication Relief from Meds: 2  Mobility use a walker ability to climb steps?  no do you drive?  yes  Function I need assistance with the following:  dressing, bathing, meal prep, household duties and shopping  Neuro/Psych weakness numbness tingling trouble walking dizziness depression anxiety  Prior Studies Any changes since last visit?  no  Physicians involved in your care Any changes since last visit?  no   Family History  Problem Relation Age of Onset  . Heart attack Mother   . Colon cancer Mother   . Heart attack Father   . Stroke Sister   . Hemophilia Child   . Cancer Unknown   . Coronary artery disease Unknown   . Cancer Brother   . Cancer Maternal Aunt   . Cancer Maternal Uncle   . Cancer Paternal Aunt   . Cancer Paternal Uncle   . Cancer Brother    Social History   Social History  . Marital status: Legally Separated    Spouse name: N/A  . Number of children: 4  . Years of education: N/A   Occupational History  . Disabled    Social History Main Topics  . Smoking status: Never Smoker  .  Smokeless tobacco: Never Used  . Alcohol use No  . Drug use: No  . Sexual activity: Not on file   Other Topics Concern  . Not on file   Social History Narrative   Patient does not get regular exercise   Denies caffeine use    Past Surgical History:  Procedure Laterality Date  . ABDOMINAL HYSTERECTOMY    . Arm Surgery Bilateral    due to fall-pt broke both forearms  . BIOPSY N/A 07/01/2013   Procedure: BIOPSY;  Surgeon: Rogene Houston, MD;  Location: [facility];  Service: Endoscopy;  Laterality: N/A;  . CHOLECYSTECTOMY    . COLONOSCOPY  08/06/2011   Procedure: COLONOSCOPY;  Surgeon: Rogene Houston, MD;  [facility];  Service: Endoscopy;  Laterality: N/A;  215  . COLONOSCOPY WITH PROPOFOL N/A 12/31/2012   Procedure: COLONOSCOPY WITH PROPOFOL;  Surgeon: Rogene Houston, MD;  Location: [facility];  Service: Endoscopy;  Laterality: N/A;  in cecum at 0814, total withdrawal time 45mn  . CORONARY ANGIOPLASTY WITH STENT PLACEMENT    . ESOPHAGEAL DILATION N/A 06/07/2015   Procedure: ESOPHAGEAL DILATION;  Surgeon: NRogene Houston MD;  [facility];  Service: Endoscopy;  Laterality: N/A;  . ESOPHAGOGASTRODUODENOSCOPY N/A 06/07/2015   Procedure: ESOPHAGOGASTRODUODENOSCOPY (EGD);  Surgeon: NRogene Houston MD;  [facility];  Service: Endoscopy;  Laterality: N/A;  2:45 - moved to 1:00 - Ann to notify pt  . ESOPHAGOGASTRODUODENOSCOPY (EGD) WITH PROPOFOL N/A 12/31/2012   Procedure: ESOPHAGOGASTRODUODENOSCOPY (EGD) WITH PROPOFOL;  Surgeon: Rogene Houston, MD;  [facility]: [facility];  Service: Endoscopy;  Laterality: N/A;  GE junction 37,  . ESOPHAGOGASTRODUODENOSCOPY (EGD) WITH PROPOFOL N/A 07/01/2013   Procedure: ESOPHAGOGASTRODUODENOSCOPY (EGD) WITH PROPOFOL;  Surgeon: Rogene Houston, MD;  [facility]: [facility];  Service: Endoscopy;  Laterality: N/A;  950  . MALONEY DILATION N/A 12/31/2012   Procedure: MALONEY DILATION;  Surgeon: Rogene Houston, MD;  Location: [facility];  Service:  Endoscopy;  Laterality: N/A;  used a # 54,#56  . MALONEY DILATION N/A 07/01/2013   Procedure: Venia Minks DILATION;  Surgeon: Rogene Houston, MD;  Location: [facility];  Service: Endoscopy;  Laterality: N/A;  54/58; no heme present   Past Medical History:  Diagnosis Date  . Anxiety   . Anxiety and depression   . Arthritis   . CAD (coronary artery disease)    Stent placement circumflex coronary 2007, catheterization 2008 patent stents. Normal LV function  . Chest pain   . CHF (congestive heart failure) ([place])   . COPD (chronic obstructive pulmonary disease) ([place])   . Depression   . Diabetes mellitus    Insulin dependent  . Diabetic polyneuropathy (HCC)     severe on multiple medications  . Dyslipidemia   . GERD (gastroesophageal reflux disease)   . Headache(784.0)   . 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 Hypertension   . MI (myocardial infarction) ([place])    2007  . Obstructive sleep apnea    CPAP machine at night  . Palpitations   . Tachycardia    There were no vitals taken for this visit.  Opioid Risk Score:   Fall Risk Score:  `1  Depression screen PHQ 2/9  Depression screen [facility] 2/9 08/07/2016 05/14/2016 10/11/2015 07/12/2015 07/12/2015 05/14/2015 05/14/2015  Decreased Interest 0 0 0 0 0 0 0  Down, Depressed, Hopeless 0 0 0 0 0 0 0  PHQ - 2 Score 0 0 0 0 0 0 0  Altered sleeping - - - - - - -  Tired, decreased energy - - - - - - -  Change in appetite - - - - - - -  Feeling bad or failure about yourself  - - - - - - -  Trouble concentrating - - - - - - -  Moving slowly or fidgety/restless - - - - - - -  Suicidal thoughts - - - - - - -  PHQ-9 Score - - - - - - -  Some recent data might be hidden     Review of Systems  Constitutional: Negative.   HENT: Negative.   Eyes: Negative.   Respiratory: Negative.   Cardiovascular: Negative.   Gastrointestinal: Positive for nausea.  Endocrine: Negative.   Genitourinary: Negative.   Musculoskeletal: Positive for joint swelling.  Skin: Negative.    Allergic/Immunologic: Negative.   Neurological: Negative.   Hematological: Negative.   Psychiatric/Behavioral: Negative.   All other systems reviewed and are negative.      Objective:   Physical Exam  Constitutional: She is oriented to person, place, and time. She appears well-developed and well-nourished.  HENT:  Head: Normocephalic and atraumatic.  Eyes: Pupils are equal, round, and reactive to light.  Neck: Normal range of motion.  Neurological: She is alert and oriented to person, place,  and time.  Motor strength is 5/5 bilateral hip flexor, knee extensor, ankle dorsiflexor, as well as deltoid by stress of grip. Sensation intact to light touch and proprioception bilateral upper limbs.   Psychiatric: She has a normal mood and affect.  Nursing note and vitals reviewed.   Patient has decreased sensation below the leg at mid calf level bilaterally. Light touch and proprioception are both affected and absent in the feet. Patient has poor balance without assisted device. She has a widened base of stance.  No pain to palpation in the feet. No skin breakdown. There is mild swelling in both feet and toes.  Tenderness to palpation in the lumbar paraspinals as well as the cervical paraspinal muscles.  Cervical range of motion 75% flexion, extension, lateral bending and rotation.      Assessment & Plan:  1. Lumbar spondylosis with chronic low back pain We discussed using BenGay or similar cream rather than Voltaren gel on the back. Patient currently using ice, may actually get better benefit from heat  2. Diabetic peripheral neuropathy. Will start Cymbalta. We'll start 20 mg a day, but may have to slowly increased to 30 mg and possibly even up to 60 mg.  3. Chronic anxiety has been on Xanax for a long period of time slowly weaning. We'll continue current dose until we start Cymbalta and then may reduce 0.25 milligrams 4 times per day next visit.  . 4. Neck pain, chronic. Also  recommend cream such as BenGay, and heating pad. Do range of motion exercises  Over half of the 25 min visit was spent counseling and coordinating care.

## 2016-09-29 NOTE — Patient Instructions (Signed)
We'll start Cymbalta this month. Will reduce Xanax to 0.25 milligrams 4 times per day next visit. May need to increase Cymbalta from 20 mg to 30 mg next visit.

## 2016-10-16 ENCOUNTER — Encounter: Payer: Self-pay | Admitting: Cardiovascular Disease

## 2016-10-16 ENCOUNTER — Ambulatory Visit (INDEPENDENT_AMBULATORY_CARE_PROVIDER_SITE_OTHER): Payer: Medicaid Other | Admitting: Cardiovascular Disease

## 2016-10-16 VITALS — BP 138/68 | HR 90 | Ht 64.0 in | Wt 230.0 lb

## 2016-10-16 DIAGNOSIS — I5032 Chronic diastolic (congestive) heart failure: Secondary | ICD-10-CM

## 2016-10-16 DIAGNOSIS — Z955 Presence of coronary angioplasty implant and graft: Secondary | ICD-10-CM | POA: Diagnosis not present

## 2016-10-16 DIAGNOSIS — E782 Mixed hyperlipidemia: Secondary | ICD-10-CM

## 2016-10-16 DIAGNOSIS — R079 Chest pain, unspecified: Secondary | ICD-10-CM

## 2016-10-16 DIAGNOSIS — I10 Essential (primary) hypertension: Secondary | ICD-10-CM

## 2016-10-16 DIAGNOSIS — I25118 Atherosclerotic heart disease of native coronary artery with other forms of angina pectoris: Secondary | ICD-10-CM

## 2016-10-16 DIAGNOSIS — K219 Gastro-esophageal reflux disease without esophagitis: Secondary | ICD-10-CM | POA: Diagnosis not present

## 2016-10-16 NOTE — Patient Instructions (Signed)

## 2016-10-16 NOTE — Progress Notes (Signed)
SUBJECTIVE: The patient returns for follow-up after undergoing cardiovascular testing performed for the evaluation of chest pain.  She underwent a normal nuclear stress test on 08/29/16.  Mrs. Walla has a history of an MI in 2007 with PCI of the circumflex. She also has IDDM, sleep apnea, irritable bowel syndrome, GERD, chronic diastolic heart failure, tachycardia-palpitations, and dyslipidemia.  She saw her gastroenterologist who increased Protonix to 40 mg twice daily and she has been doing so for the past 2 or 3 months. Her chest pain symptoms have markedly subsided. She also has a history of dysphagia for solids and has a history of esophageal dilatation.  She denies exertional chest pain.  She has been using CPAP consistently for the past 2 weeks.  She has not had use nitroglycerin.   Review of Systems: As per "subjective", otherwise negative.  Allergies  Allergen Reactions  . Nitrofuran Derivatives Itching and Swelling  . Amphetamine-Dextroamphet Er Swelling  . Pregabalin Swelling  . Topiramate Other (See Comments)    Tongue tingle  . Verelan [Verapamil] Rash    Current Outpatient Prescriptions  Medication Sig Dispense Refill  . ACCU-CHEK AVIVA PLUS test strip USE TO CHECK BLOOD SUGAR THREE TIMES DAILY - DX: E11.9 100 each 5  . ALPRAZolam (XANAX) 0.5 MG tablet Take 1 tablet (0.5 mg total) by mouth 3 (three) times daily as needed for anxiety. 90 tablet 2  . aspirin EC 81 MG tablet Take 81 mg by mouth daily.    Marland Kitchen atorvastatin (LIPITOR) 40 MG tablet Take 40 mg by mouth daily.    . beclomethasone (QVAR) 40 MCG/ACT inhaler Inhale 1 puff into the lungs 2 (two) times daily.    Marland Kitchen buPROPion (WELLBUTRIN SR) 100 MG 12 hr tablet Take 100 mg by mouth daily.    . diclofenac sodium (VOLTAREN) 1 % GEL APPLY 2 GRAMS ON BOTH KNEES THREE TIMES DAILY 3 Tube 2  . dicyclomine (BENTYL) 10 MG capsule TAKE 1 CAPSULE BY MOUTH TWICE DAILY AS NEEDED FOR SPASMS. 60 capsule 5  . DULoxetine  (CYMBALTA) 20 MG capsule Take 1 capsule (20 mg total) by mouth daily. 30 capsule 0  . estrogens, conjugated, (PREMARIN) 1.25 MG tablet Take 1.25 mg by mouth daily.     . fluticasone-salmeterol (ADVAIR HFA) 115-21 MCG/ACT inhaler Inhale 2 puffs into the lungs 2 (two) times daily.    Marland Kitchen gabapentin (NEURONTIN) 100 MG capsule Take 1 capsule (100 mg total) by mouth 3 (three) times daily. 90 capsule 6  . GLOBAL EASE INJECT PEN NEEDLES 31G X 8 MM MISC USE AS DIRECTED FOUR TIMES DAILY 100 each 3  . ibuprofen (ADVIL,MOTRIN) 200 MG tablet Take 200 mg by mouth daily as needed for headache.    . Insulin Glargine (LANTUS SOLOSTAR) 100 UNIT/ML Solostar Pen Inject 50 Units into the skin at bedtime. 15 mL 3  . INVOKANA 100 MG TABS tablet TAKE 1 TABLET BY MOUTH DAILY BEFORE BREAKFAST 30 tablet 2  . lidocaine (XYLOCAINE) 5 % ointment Apply 1 application topically at bedtime.     . metFORMIN (GLUCOPHAGE) 500 MG tablet TAKE ONE TABLET BY MOUTH TWICE DAILY with a meal. 60 tablet 2  . methocarbamol (ROBAXIN) 500 MG tablet Take 1 tablet (500 mg total) by mouth 3 (three) times daily. 45 tablet 0  . metoprolol succinate (TOPROL-XL) 50 MG 24 hr tablet TAKE 1 AND 1/2 TABLETS DAILY 75MG 135 tablet 1  . morphine (MS CONTIN) 30 MG 12 hr tablet Take 1 tablet (  30 mg total) by mouth every 8 (eight) hours. 90 tablet 0  . nitroGLYCERIN (NITROSTAT) 0.4 MG SL tablet Place 1 tablet (0.4 mg total) under the tongue every 5 (five) minutes x 3 doses as needed. For chest pains 25 tablet 3  . ondansetron (ZOFRAN) 4 MG tablet Take 4 mg by mouth every 6 (six) hours as needed for nausea or vomiting.    . pantoprazole (PROTONIX) 40 MG tablet TAKE 1 TABLET BY MOUTH TWICE DAILY BEFORE MEALS 60 tablet 5  . psyllium (METAMUCIL SMOOTH TEXTURE) 28 % packet Take 1 packet by mouth at bedtime.     No current facility-administered medications for this visit.     Past Medical History:  Diagnosis Date  . Anxiety   . Anxiety and depression   .  Arthritis   . CAD (coronary artery disease)    Stent placement circumflex coronary 2007, catheterization 2008 patent stents. Normal LV function  . Chest pain   . CHF (congestive heart failure) ([place])   . COPD (chronic obstructive pulmonary disease) ([place])   . Depression   . Diabetes mellitus    Insulin dependent  . Diabetic polyneuropathy (HCC)     severe on multiple medications  . Dyslipidemia   . GERD (gastroesophageal reflux disease)   . Headache(784.0)   . 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 Hypertension   . MI (myocardial infarction) ([place])    2007  . Obstructive sleep apnea    CPAP machine at night  . Palpitations   . Tachycardia     Past Surgical History:  Procedure Laterality Date  . ABDOMINAL HYSTERECTOMY    . Arm Surgery Bilateral    due to fall-pt broke both forearms  . BIOPSY N/A 07/01/2013   Procedure: BIOPSY;  Surgeon: Rogene Houston, MD;  Location: [facility];  Service: Endoscopy;  Laterality: N/A;  . CHOLECYSTECTOMY    . COLONOSCOPY  08/06/2011   Procedure: COLONOSCOPY;  Surgeon: Rogene Houston, MD;  [facility];  Service: Endoscopy;  Laterality: N/A;  215  . COLONOSCOPY WITH PROPOFOL N/A 12/31/2012   Procedure: COLONOSCOPY WITH PROPOFOL;  Surgeon: Rogene Houston, MD;  Location: [facility];  Service: Endoscopy;  Laterality: N/A;  in cecum at 0814, total withdrawal time 49mn  . CORONARY ANGIOPLASTY WITH STENT PLACEMENT    . ESOPHAGEAL DILATION N/A 06/07/2015   Procedure: ESOPHAGEAL DILATION;  Surgeon: NRogene Houston MD;  [facility];  Service: Endoscopy;  Laterality: N/A;  . ESOPHAGOGASTRODUODENOSCOPY N/A 06/07/2015   Procedure: ESOPHAGOGASTRODUODENOSCOPY (EGD);  Surgeon: NRogene Houston MD;  [facility];  Service: Endoscopy;  Laterality: N/A;  2:45 - moved to 1:00 - Ann to notify pt  . ESOPHAGOGASTRODUODENOSCOPY (EGD) WITH PROPOFOL N/A 12/31/2012   Procedure: ESOPHAGOGASTRODUODENOSCOPY (EGD) WITH PROPOFOL;  Surgeon: NRogene Houston MD;   [facility];  Service: Endoscopy;  Laterality: N/A;  GE junction 37,  . ESOPHAGOGASTRODUODENOSCOPY (EGD) WITH PROPOFOL N/A 07/01/2013   Procedure: ESOPHAGOGASTRODUODENOSCOPY (EGD) WITH PROPOFOL;  Surgeon: NRogene Houston MD;  [facility];  Service: Endoscopy;  Laterality: N/A;  950  . MALONEY DILATION N/A 12/31/2012   Procedure: MALONEY DILATION;  Surgeon: NRogene Houston MD;  [facility]: [facility];  Service: Endoscopy;  Laterality: N/A;  used a # 54,#56  . MALONEY DILATION N/A 07/01/2013   Procedure: MVenia MinksDILATION;  Surgeon: NRogene Houston MD;  [facility]: [facility];  Service: Endoscopy;  Laterality: N/A;  54/58; no heme present  Social History   Social History  . Marital status: Legally Separated    Spouse name: N/A  . Number of children: 4  . Years of education: N/A   Occupational History  . Disabled    Social History Main Topics  . Smoking status: Never Smoker  . Smokeless tobacco: Never Used  . Alcohol use No  . Drug use: No  . Sexual activity: Not on file   Other Topics Concern  . Not on file   Social History Narrative   Patient does not get regular exercise   Denies caffeine use      Vitals:   10/16/16 1429  BP: 138/68  Pulse: 90  SpO2: 98%  Weight: 230 lb (104.3 kg)  Height: 5' 4"  (1.626 m)    Wt Readings from Last 3 Encounters:  10/16/16 230 lb (104.3 kg)  08/20/16 225 lb (102.1 kg)  08/07/16 224 lb (101.6 kg)     PHYSICAL EXAM General: NAD HEENT: Normal. Neck: No JVD, no thyromegaly. Lungs: Clear to auscultation bilaterally with normal respiratory effort. CV: Nondisplaced PMI.  Regular rate and rhythm, normal S1/S2, no S3/S4, no murmur. No pretibial or periankle edema.  No carotid bruit.   Abdomen: Soft, nontender, no distention.  Neurologic: Alert and oriented.  Psych: Normal affect. Skin: Normal. Musculoskeletal: No gross deformities.    ECG: Most recent ECG reviewed.   Labs: Lab Results  Component Value Date/Time   K 4.6  07/31/2016 09:13 AM   BUN 14 07/31/2016 09:13 AM   CREATININE 0.70 07/31/2016 09:13 AM   CREATININE 0.68 01/08/2016 03:29 PM   ALT 9 07/31/2016 09:13 AM   TSH 3.070 04/15/2016 11:21 AM   HGB 11.5 (L) 06/30/2013 03:40 PM     Lipids: Lab Results  Component Value Date/Time   LDLCALC 50 04/15/2016 11:21 AM   CHOL 184 04/15/2016 11:21 AM   TRIG 284 (H) 04/15/2016 11:21 AM   HDL 77 04/15/2016 11:21 AM       ASSESSMENT AND PLAN: 1. CAD: Stable ischemic heart disease. Normal nuclear stress test in June 2018 as detailed above. Continue aspirin, Lipitor, and metoprolol.  2. Chronic diastolic heart failure: Euvolemic. No changes. Not on diuretic therapy.  3. Essential hypertension: Elevated as target BP is 130/80 as per most recent 2017 guidelines. Given h/o MI and diabetes, ACEI/ARB is warranted.   4. Hyperlipidemia: Continue Lipitor.  5. GERD: Taking Protonix 40 mg twice daily. Has a history of dysphagia for solids and previous esophageal dilatations. Follows with GI. I believe her prior symptoms were related to this.    Disposition: Follow up 1 yr   Kate Sable, M.D., F.A.C.C.

## 2016-10-28 ENCOUNTER — Encounter: Payer: Self-pay | Admitting: Registered Nurse

## 2016-10-28 ENCOUNTER — Telehealth: Payer: Self-pay | Admitting: Registered Nurse

## 2016-10-28 ENCOUNTER — Encounter: Payer: Medicaid Other | Attending: Physical Medicine & Rehabilitation | Admitting: Registered Nurse

## 2016-10-28 VITALS — BP 121/77 | HR 97

## 2016-10-28 DIAGNOSIS — M79605 Pain in left leg: Secondary | ICD-10-CM | POA: Diagnosis not present

## 2016-10-28 DIAGNOSIS — E1142 Type 2 diabetes mellitus with diabetic polyneuropathy: Secondary | ICD-10-CM | POA: Diagnosis not present

## 2016-10-28 DIAGNOSIS — I509 Heart failure, unspecified: Secondary | ICD-10-CM | POA: Insufficient documentation

## 2016-10-28 DIAGNOSIS — F3289 Other specified depressive episodes: Secondary | ICD-10-CM | POA: Diagnosis not present

## 2016-10-28 DIAGNOSIS — E785 Hyperlipidemia, unspecified: Secondary | ICD-10-CM | POA: Diagnosis not present

## 2016-10-28 DIAGNOSIS — R000 Tachycardia, unspecified: Secondary | ICD-10-CM | POA: Insufficient documentation

## 2016-10-28 DIAGNOSIS — Z1401 Asymptomatic hemophilia A carrier: Secondary | ICD-10-CM | POA: Insufficient documentation

## 2016-10-28 DIAGNOSIS — R002 Palpitations: Secondary | ICD-10-CM | POA: Insufficient documentation

## 2016-10-28 DIAGNOSIS — M25562 Pain in left knee: Secondary | ICD-10-CM

## 2016-10-28 DIAGNOSIS — M1990 Unspecified osteoarthritis, unspecified site: Secondary | ICD-10-CM | POA: Insufficient documentation

## 2016-10-28 DIAGNOSIS — M25561 Pain in right knee: Secondary | ICD-10-CM | POA: Diagnosis not present

## 2016-10-28 DIAGNOSIS — G4733 Obstructive sleep apnea (adult) (pediatric): Secondary | ICD-10-CM | POA: Diagnosis not present

## 2016-10-28 DIAGNOSIS — G894 Chronic pain syndrome: Secondary | ICD-10-CM | POA: Diagnosis not present

## 2016-10-28 DIAGNOSIS — M7061 Trochanteric bursitis, right hip: Secondary | ICD-10-CM | POA: Diagnosis not present

## 2016-10-28 DIAGNOSIS — M79604 Pain in right leg: Secondary | ICD-10-CM | POA: Insufficient documentation

## 2016-10-28 DIAGNOSIS — G4700 Insomnia, unspecified: Secondary | ICD-10-CM | POA: Diagnosis not present

## 2016-10-28 DIAGNOSIS — Z5181 Encounter for therapeutic drug level monitoring: Secondary | ICD-10-CM

## 2016-10-28 DIAGNOSIS — Z760 Encounter for issue of repeat prescription: Secondary | ICD-10-CM | POA: Insufficient documentation

## 2016-10-28 DIAGNOSIS — Z794 Long term (current) use of insulin: Secondary | ICD-10-CM | POA: Diagnosis not present

## 2016-10-28 DIAGNOSIS — Z79899 Other long term (current) drug therapy: Secondary | ICD-10-CM | POA: Diagnosis not present

## 2016-10-28 DIAGNOSIS — M5416 Radiculopathy, lumbar region: Secondary | ICD-10-CM | POA: Diagnosis not present

## 2016-10-28 DIAGNOSIS — F418 Other specified anxiety disorders: Secondary | ICD-10-CM | POA: Diagnosis not present

## 2016-10-28 DIAGNOSIS — M47896 Other spondylosis, lumbar region: Secondary | ICD-10-CM | POA: Diagnosis not present

## 2016-10-28 DIAGNOSIS — M5489 Other dorsalgia: Secondary | ICD-10-CM | POA: Diagnosis not present

## 2016-10-28 DIAGNOSIS — I2510 Atherosclerotic heart disease of native coronary artery without angina pectoris: Secondary | ICD-10-CM | POA: Insufficient documentation

## 2016-10-28 DIAGNOSIS — J449 Chronic obstructive pulmonary disease, unspecified: Secondary | ICD-10-CM | POA: Insufficient documentation

## 2016-10-28 DIAGNOSIS — I252 Old myocardial infarction: Secondary | ICD-10-CM | POA: Insufficient documentation

## 2016-10-28 DIAGNOSIS — R079 Chest pain, unspecified: Secondary | ICD-10-CM | POA: Diagnosis not present

## 2016-10-28 DIAGNOSIS — K219 Gastro-esophageal reflux disease without esophagitis: Secondary | ICD-10-CM | POA: Diagnosis not present

## 2016-10-28 DIAGNOSIS — M47816 Spondylosis without myelopathy or radiculopathy, lumbar region: Secondary | ICD-10-CM | POA: Diagnosis not present

## 2016-10-28 DIAGNOSIS — R51 Headache: Secondary | ICD-10-CM | POA: Diagnosis not present

## 2016-10-28 DIAGNOSIS — F411 Generalized anxiety disorder: Secondary | ICD-10-CM | POA: Diagnosis not present

## 2016-10-28 DIAGNOSIS — G8929 Other chronic pain: Secondary | ICD-10-CM | POA: Diagnosis not present

## 2016-10-28 MED ORDER — DULOXETINE HCL 30 MG PO CPEP: 30.0000 mg | ORAL_CAPSULE | Freq: Every day | ORAL | 0 refills | Status: DC

## 2016-10-28 MED ORDER — ALPRAZOLAM 0.25 MG PO TABS: 0.2500 mg | ORAL_TABLET | Freq: Four times a day (QID) | ORAL | 2 refills | Status: DC | PRN

## 2016-10-28 MED ORDER — MORPHINE SULFATE ER 30 MG PO TBCR: 30.0000 mg | EXTENDED_RELEASE_TABLET | Freq: Three times a day (TID) | ORAL | 0 refills | Status: DC

## 2016-10-28 MED ORDER — ALPRAZOLAM 0.5 MG PO TABS: 0.5000 mg | ORAL_TABLET | Freq: Three times a day (TID) | ORAL | 2 refills | Status: DC | PRN

## 2016-10-28 NOTE — Progress Notes (Signed)
Subjective:    Patient ID: [name], female    DOB: May 29, 1950, 66 y.o.   MRN: [id number]  HPI: [name] is a 66year old female who returns for follow up appointmentfor chronic pain and medication refill. She states her pain is located in her neck,bilateral shoulders,lowerback radiating into herbilateral lower extremities,bilateralkneesand bilateral feet. She rates her pain 7. Her current exercise regime is performing chair exercises and walking short distances with her walker.   [name] was prescribed Cymbalta 20 mg last month, she states she still experiencing depression and anxiety, Cymbalta will be increased to 30 mg daily and Xanax decreased to 0.25 mg QID per Dr. Letta Pate instructions. She verbalizes understanding. Instructed to call office in a few weeks to evaluate medication changes, she verbalizes understanding.   Last UDS was on 06/30/2016 it was consistent.    Pain Inventory Average Pain 8 Pain Right Now 7 My pain is sharp, burning, dull, stabbing, tingling and aching  In the last 24 hours, has pain interfered with the following? General activity 7 Relation with others 7 Enjoyment of life 9 What TIME of day is your pain at its worst? morning and night Sleep (in general) Fair  Pain is worse with: walking, bending, standing and some activites Pain improves with: rest, heat/ice and medication Relief from Meds: 2  Mobility use a walker how many minutes can you walk? 5 ability to climb steps?  no do you drive?  yes  Function I need assistance with the following:  meal prep, household duties and shopping  Neuro/Psych bladder control problems bowel control problems numbness tingling spasms dizziness depression anxiety  Prior Studies Any changes since last visit?  no  Physicians involved in your care Any changes since last visit?  no   Family History  Problem Relation Age of Onset  . Heart attack Mother   . Colon cancer Mother    . Heart attack Father   . Stroke Sister   . Hemophilia Child   . Cancer Unknown   . Coronary artery disease Unknown   . Cancer Brother   . Cancer Maternal Aunt   . Cancer Maternal Uncle   . Cancer Paternal Aunt   . Cancer Paternal Uncle   . Cancer Brother    Social History   Social History  . Marital status: Legally Separated    Spouse name: N/A  . Number of children: 4  . Years of education: N/A   Occupational History  . Disabled    Social History Main Topics  . Smoking status: Never Smoker  . Smokeless tobacco: Never Used  . Alcohol use No  . Drug use: No  . Sexual activity: Not Asked   Other Topics Concern  . None   Social History Narrative   Patient does not get regular exercise   Denies caffeine use    Past Surgical History:  Procedure Laterality Date  . ABDOMINAL HYSTERECTOMY    . Arm Surgery Bilateral    due to fall-pt broke both forearms  . BIOPSY N/A 07/01/2013   Procedure: BIOPSY;  Surgeon: Rogene Houston, MD;  Location: [facility];  Service: Endoscopy;  Laterality: N/A;  . CHOLECYSTECTOMY    . COLONOSCOPY  08/06/2011   Procedure: COLONOSCOPY;  Surgeon: Rogene Houston, MD;  [facility];  Service: Endoscopy;  Laterality: N/A;  215  . COLONOSCOPY WITH PROPOFOL N/A 12/31/2012   Procedure: COLONOSCOPY WITH PROPOFOL;  Surgeon: Rogene Houston, MD;  Location:  [facility];  Service: Endoscopy;  Laterality: N/A;  in cecum at 0814, total withdrawal time 78mn  . CORONARY ANGIOPLASTY WITH STENT PLACEMENT    . ESOPHAGEAL DILATION N/A 06/07/2015   Procedure: ESOPHAGEAL DILATION;  Surgeon: NRogene Houston MD;  [facility];  Service: Endoscopy;  Laterality: N/A;  . ESOPHAGOGASTRODUODENOSCOPY N/A 06/07/2015   Procedure: ESOPHAGOGASTRODUODENOSCOPY (EGD);  Surgeon: NRogene Houston MD;  [facility];  Service: Endoscopy;  Laterality: N/A;  2:45 - moved to 1:00 - Ann to notify pt  . ESOPHAGOGASTRODUODENOSCOPY (EGD) WITH PROPOFOL N/A 12/31/2012    Procedure: ESOPHAGOGASTRODUODENOSCOPY (EGD) WITH PROPOFOL;  Surgeon: NRogene Houston MD;  [facility]: [facility];  Service: Endoscopy;  Laterality: N/A;  GE junction 37,  . ESOPHAGOGASTRODUODENOSCOPY (EGD) WITH PROPOFOL N/A 07/01/2013   Procedure: ESOPHAGOGASTRODUODENOSCOPY (EGD) WITH PROPOFOL;  Surgeon: NRogene Houston MD;  [facility]: [facility];  Service: Endoscopy;  Laterality: N/A;  950  . MALONEY DILATION N/A 12/31/2012   Procedure: MALONEY DILATION;  Surgeon: NRogene Houston MD;  [facility]: [facility];  Service: Endoscopy;  Laterality: N/A;  used a # 54,#56  . MALONEY DILATION N/A 07/01/2013   Procedure: MVenia MinksDILATION;  Surgeon: NRogene Houston MD;  [facility]: [facility];  Service: Endoscopy;  Laterality: N/A;  54/58; no heme present   Past Medical History:  Diagnosis Date  . Anxiety   . Anxiety and depression   . Arthritis   . CAD (coronary artery disease)    Stent placement circumflex coronary 2007, catheterization 2008 patent stents. Normal LV function  . Chest pain   . CHF ([place]estive heart failure) ([place]   . COPD [place]chronic obstructive pulmonary disease) ([place]   . Depression   . Diabetes mellitus    Insulin dependent  . Diabetic polyneuropathy (HCC)     severe on multiple medications  . Dyslipidemia   . GERD (gastroesophageal reflux disease)   . Headache(784.0)   . 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 Hypertension   . MI (myocardial infarction) ([place]    2007  . Obstructive sleep apnea    CPAP machine at night  . Palpitations   . Tachycardia    BP 121/77   Pulse 97   SpO2 94%   Opioid Risk Score:  1 Fall Risk Score:  `1  Depression screen PHQ 2/9  [facility] 10/28/2016 08/07/2016 05/14/2016 10/11/2015 07/12/2015 07/12/2015 05/14/2015  Decreased Interest 2 0 0 0 0 0 0  Down, Depressed, Hopeless - 0 0 0 0 0 0  PHQ - 2 Score 2 0 0 0 0 0 0  Altered sleeping - - - - - - -  Tired, decreased energy - - - - - - -  Change in appetite - - - - - - -  Feeling bad or failure about  yourself  - - - - - - -  Trouble concentrating - - - - - - -  Moving slowly or fidgety/restless - - - - - - -  Suicidal thoughts - - - - - - -  PHQ-9 Score - - - - - - -  Some recent data might be hidden   Review of Systems  Constitutional: Negative.   HENT: Negative.   Eyes: Negative.   Respiratory: Positive for apnea, shortness of breath and wheezing.   Gastrointestinal: Positive for abdominal pain, constipation, diarrhea and nausea.  Endocrine: Negative.   Genitourinary:       Bladder control  Musculoskeletal:  Spasms  Allergic/Immunologic: Negative.   Neurological: Positive for dizziness and numbness.       Tingling  Hematological: Negative.   Psychiatric/Behavioral: Positive for dysphoric mood. The patient is nervous/anxious.   All other systems reviewed and are negative.      Objective:   Physical Exam  Constitutional: She is oriented to person, place, and time. She appears well-developed and well-nourished.  HENT:  Head: Normocephalic and atraumatic.  Neck: Normal range of motion. Neck supple.  Cardiovascular: Normal rate and regular rhythm.   Pulmonary/Chest: Effort normal and breath sounds normal.  Musculoskeletal:  Normal Muscle Bulk and Muscle Testing Reveals:  Upper Extremities: Full ROM and Muscle Strength 5/5 Thoracic and Lumbar Hypersensitivity Lower Extremities: Full ROM and Muscle Strength 5/5 Left lower Extremity Flexion Produces Pain into Left Patella Arises from Table slowly using walker for support Narrow Based Gait  Neurological: She is alert and oriented to person, place, and time.  Skin: Skin is warm and dry.  Psychiatric: She has a normal mood and affect.  Nursing note and vitals reviewed.         Assessment & Plan:  1.Lumbar pain lumbar spondylosis: Encouraged to increase Activity as tolerated. 10/28/2016 Refilled: MS Contin 30 MG one tablet every 8 hours #90. We will continue the opioid monitoring program, this consists of  regular clinic visits, examinations, urine drug screen, pill counts as well as use of [place] Controlled Substance Reporting System. 2. Lumbar Radiculitis: Continue Gabapentin. 10/28/2016 3. Bilateral Knee Pain/ Degenerative: Continue Voltaren Gel. Continue to Monitor. 10/28/2016. 4. Severe diabetic poly neuropathy: Continue Gabapentin. 10/28/2016 5. Insomnia: ContinueTrazodone PCP Following. 10/28/2016 6. Anxiety/ Depression: Increased Cymbalta 30 mg daily and Decreased  Xanax 0.11m QID. 10/28/2016  20 minutes of face to face patient care time was spent during this visit. All questions were encouraged and answered.  F/U in 1 month

## 2016-10-28 NOTE — Telephone Encounter (Signed)
On 10/28/2016 the [place] was reviewed no conflict was seen on the [place] with multiple prescribers. [name] has a signed narcotic contract with our office. If there were any discrepancies this would have been reported to her physician.

## 2016-11-03 ENCOUNTER — Other Ambulatory Visit: Payer: Self-pay | Admitting: "Endocrinology

## 2016-11-07 LAB — CMP14+EGFR
ALT: 8 IU/L (ref 0–32)
AST: 8 IU/L (ref 0–40)
Albumin/Globulin Ratio: 1.6 (ref 1.2–2.2)
Albumin: 3.4 g/dL — ABNORMAL LOW (ref 3.6–4.8)
Alkaline Phosphatase: 76 IU/L (ref 39–117)
BUN/Creatinine Ratio: 15 (ref 12–28)
BUN: 11 mg/dL (ref 8–27)
Bilirubin Total: 0.3 mg/dL (ref 0.0–1.2)
CO2: 23 mmol/L (ref 20–29)
Calcium: 8.9 mg/dL (ref 8.7–10.3)
Chloride: 107 mmol/L — ABNORMAL HIGH (ref 96–106)
Creatinine, Ser: 0.74 mg/dL (ref 0.57–1.00)
GFR calc Af Amer: 100 mL/min/{1.73_m2} (ref >59)
GFR calc non Af Amer: 86 mL/min/{1.73_m2} (ref >59)
Globulin, Total: 2.1 g/dL (ref 1.5–4.5)
Glucose: 199 mg/dL — ABNORMAL HIGH (ref 65–99)
Potassium: 4.5 mmol/L (ref 3.5–5.2)
Sodium: 143 mmol/L (ref 134–144)
Total Protein: 5.5 g/dL — ABNORMAL LOW (ref 6.0–8.5)

## 2016-11-11 ENCOUNTER — Ambulatory Visit (INDEPENDENT_AMBULATORY_CARE_PROVIDER_SITE_OTHER): Payer: Medicaid Other | Admitting: "Endocrinology

## 2016-11-11 ENCOUNTER — Encounter: Payer: Self-pay | Admitting: "Endocrinology

## 2016-11-11 ENCOUNTER — Other Ambulatory Visit: Payer: Self-pay | Admitting: "Endocrinology

## 2016-11-11 VITALS — BP 128/77 | HR 71 | Ht 64.0 in | Wt 228.0 lb

## 2016-11-11 DIAGNOSIS — I10 Essential (primary) hypertension: Secondary | ICD-10-CM | POA: Diagnosis not present

## 2016-11-11 DIAGNOSIS — E1159 Type 2 diabetes mellitus with other circulatory complications: Secondary | ICD-10-CM | POA: Diagnosis not present

## 2016-11-11 DIAGNOSIS — E782 Mixed hyperlipidemia: Secondary | ICD-10-CM

## 2016-11-11 NOTE — Patient Instructions (Signed)

## 2016-11-11 NOTE — Progress Notes (Signed)
Subjective:    Patient ID: [name], female    DOB: January 01, 1951. Patient is being seen in f/u for management of diabetes.  Past Medical History:  Diagnosis Date  . Anxiety   . Anxiety and depression   . Arthritis   . CAD (coronary artery disease)    Stent placement circumflex coronary 2007, catheterization 2008 patent stents. Normal LV function  . Chest pain   . CHF (congestive heart failure) ([place])   . COPD (chronic obstructive pulmonary disease) ([place])   . Depression   . Diabetes mellitus    Insulin dependent  . Diabetic polyneuropathy (HCC)     severe on multiple medications  . Dyslipidemia   . GERD (gastroesophageal reflux disease)   . Headache(784.0)   . 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 Hypertension   . MI (myocardial infarction) ([place])    2007  . Obstructive sleep apnea    CPAP machine at night  . Palpitations   . Tachycardia    Past Surgical History:  Procedure Laterality Date  . ABDOMINAL HYSTERECTOMY    . Arm Surgery Bilateral    due to fall-pt broke both forearms  . BIOPSY N/A 07/01/2013   Procedure: BIOPSY;  Surgeon: Rogene Houston, MD;  [facility];  Service: Endoscopy;  Laterality: N/A;  . CHOLECYSTECTOMY    . COLONOSCOPY  08/06/2011   Procedure: COLONOSCOPY;  Surgeon: Rogene Houston, MD;  [facility];  Service: Endoscopy;  Laterality: N/A;  215  . COLONOSCOPY WITH PROPOFOL N/A 12/31/2012   Procedure: COLONOSCOPY WITH PROPOFOL;  Surgeon: Rogene Houston, MD;  [facility];  Service: Endoscopy;  Laterality: N/A;  in cecum at 0814, total withdrawal time 68min  . CORONARY ANGIOPLASTY WITH STENT PLACEMENT    . ESOPHAGEAL DILATION N/A 06/07/2015   Procedure: ESOPHAGEAL DILATION;  Surgeon: Rogene Houston, MD;  [facility];  Service: Endoscopy;  Laterality: N/A;  . ESOPHAGOGASTRODUODENOSCOPY N/A 06/07/2015   Procedure: ESOPHAGOGASTRODUODENOSCOPY (EGD);  Surgeon: Rogene Houston, MD;  [facility];  Service: Endoscopy;   Laterality: N/A;  2:45 - moved to 1:00 - Ann to notify pt  . ESOPHAGOGASTRODUODENOSCOPY (EGD) WITH PROPOFOL N/A 12/31/2012   Procedure: ESOPHAGOGASTRODUODENOSCOPY (EGD) WITH PROPOFOL;  Surgeon: Rogene Houston, MD;  [facility];  Service: Endoscopy;  Laterality: N/A;  GE junction 37,  . ESOPHAGOGASTRODUODENOSCOPY (EGD) WITH PROPOFOL N/A 07/01/2013   Procedure: ESOPHAGOGASTRODUODENOSCOPY (EGD) WITH PROPOFOL;  Surgeon: Rogene Houston, MD;  [facility];  Service: Endoscopy;  Laterality: N/A;  950  . MALONEY DILATION N/A 12/31/2012   Procedure: MALONEY DILATION;  Surgeon: Rogene Houston, MD;  Location[facility];  Service: Endoscopy;  Laterality: N/A;  used a # 54,#56  . MALONEY DILATION N/A 07/01/2013   Procedure: Venia Minks DILATION;  Surgeon: Rogene Houston, MD;  Location[facility];  Service: Endoscopy;  Laterality: N/A;  54/58; no heme present   Social History   Social History  . Marital status: Legally Separated    Spouse name: N/A  . Number of children: 4  . Years of education: N/A   Occupational History  . Disabled    Social History Main Topics  . Smoking status: Never Smoker  . Smokeless tobacco: Never Used  . Alcohol use No  . Drug use: No  . Sexual activity: Not Asked   Other Topics Concern  . None   Social History Narrative   Patient does not get regular exercise  Denies caffeine use    Outpatient Encounter Prescriptions as of 11/11/2016  Medication Sig  . ACCU-CHEK AVIVA PLUS test strip USE TO CHECK BLOOD SUGAR THREE TIMES DAILY - DX: E11.9  . ALPRAZolam (XANAX) 0.25 MG tablet Take 1 tablet (0.25 mg total) by mouth 4 (four) times daily as needed for anxiety.  Marland Kitchen aspirin EC 81 MG tablet Take 81 mg by mouth daily.  Marland Kitchen atorvastatin (LIPITOR) 40 MG tablet Take 40 mg by mouth daily.  . beclomethasone (QVAR) 40 MCG/ACT inhaler Inhale 1 puff into the lungs 2 (two) times daily.  Marland Kitchen buPROPion (WELLBUTRIN SR) 100 MG 12 hr tablet Take 100 mg by mouth daily.  . diclofenac  sodium (VOLTAREN) 1 % GEL APPLY 2 GRAMS ON BOTH KNEES THREE TIMES DAILY  . dicyclomine (BENTYL) 10 MG capsule TAKE 1 CAPSULE BY MOUTH TWICE DAILY AS NEEDED FOR SPASMS.  . DULoxetine (CYMBALTA) 30 MG capsule Take 1 capsule (30 mg total) by mouth daily.  Marland Kitchen estrogens, conjugated, (PREMARIN) 1.25 MG tablet Take 1.25 mg by mouth daily.   . fluticasone-salmeterol (ADVAIR HFA) 115-21 MCG/ACT inhaler Inhale 2 puffs into the lungs 2 (two) times daily.  Marland Kitchen gabapentin (NEURONTIN) 100 MG capsule Take 1 capsule (100 mg total) by mouth 3 (three) times daily.  Marland Kitchen GLOBAL EASE INJECT PEN NEEDLES 31G X 8 MM MISC USE AS DIRECTED FOUR TIMES DAILY  . ibuprofen (ADVIL,MOTRIN) 200 MG tablet Take 200 mg by mouth daily as needed for headache.  . INVOKANA 100 MG TABS tablet TAKE 1 TABLET BY MOUTH DAILY BEFORE BREAKFAST  . LANTUS SOLOSTAR 100 UNIT/ML Solostar Pen INJECT 50 UNITS INTO THE SKIN AT BEDTIME. (Patient taking differently: Inject 40 Units into the skin at bedtime. )  . lidocaine (XYLOCAINE) 5 % ointment Apply 1 application topically at bedtime.   . metFORMIN (GLUCOPHAGE) 500 MG tablet TAKE ONE TABLET BY MOUTH TWICE DAILY with a meal.  . methocarbamol (ROBAXIN) 500 MG tablet Take 1 tablet (500 mg total) by mouth 3 (three) times daily.  . metoprolol succinate (TOPROL-XL) 50 MG 24 hr tablet TAKE 1 AND 1/2 TABLETS DAILY 75MG   . morphine (MS CONTIN) 30 MG 12 hr tablet Take 1 tablet (30 mg total) by mouth every 8 (eight) hours.  . nitroGLYCERIN (NITROSTAT) 0.4 MG SL tablet Place 1 tablet (0.4 mg total) under the tongue every 5 (five) minutes x 3 doses as needed. For chest pains  . ondansetron (ZOFRAN) 4 MG tablet Take 4 mg by mouth every 6 (six) hours as needed for nausea or vomiting.  . pantoprazole (PROTONIX) 40 MG tablet TAKE 1 TABLET BY MOUTH TWICE DAILY BEFORE MEALS  . psyllium (METAMUCIL SMOOTH TEXTURE) 28 % packet Take 1 packet by mouth at bedtime.   No facility-administered encounter medications on file as of  11/11/2016.    ALLERGIES: Allergies  Allergen Reactions  . Nitrofuran Derivatives Itching and Swelling  . Amphetamine-Dextroamphet Er Swelling  . Pregabalin Swelling  . Topiramate Other (See Comments)    Tongue tingle  . Verelan [Verapamil] Rash   VACCINATION STATUS:  There is no immunization history on file for this patient.  Diabetes  She presents for her follow-up diabetic visit. She has type 2 diabetes mellitus. Onset time: She was diagnosed at approximate age of 71 years. Her disease course has been improving. There are no hypoglycemic associated symptoms. Pertinent negatives for hypoglycemia include no confusion, headaches, pallor or seizures. Associated symptoms include fatigue. Pertinent negatives for diabetes include no chest pain, no polydipsia,  no polyphagia and no polyuria. There are no hypoglycemic complications. Symptoms are improving. Diabetic complications include autonomic neuropathy, heart disease, peripheral neuropathy and retinopathy. Risk factors for coronary artery disease include dyslipidemia, diabetes mellitus, obesity, hypertension and sedentary lifestyle. Current diabetic treatments: Lantus 65 units daily at bedtime, Humalog 15 units 3 times a day before meals. She mentions allergy to Victoza, Byetta, and metformin. Her weight is stable. She is following a generally unhealthy diet. When asked about meal planning, she reported none. She has not had a previous visit with a dietitian. She never participates in exercise. Her breakfast blood glucose range is generally 140-180 mg/dl. Her overall blood glucose range is 140-180 mg/dl. An ACE inhibitor/angiotensin II receptor blocker is not being taken. Eye exam is current.  Hyperlipidemia  This is a chronic problem. The current episode started more than 1 year ago. The problem is uncontrolled. Recent lipid tests were reviewed and are high. Exacerbating diseases include diabetes and obesity. Pertinent negatives include no chest  pain, myalgias or shortness of breath. Current antihyperlipidemic treatment includes statins. Risk factors for coronary artery disease include diabetes mellitus, dyslipidemia, hypertension, obesity and a sedentary lifestyle.  Hypertension  This is a chronic problem. The current episode started more than 1 year ago. Pertinent negatives include no chest pain, headaches, palpitations or shortness of breath. Risk factors for coronary artery disease include diabetes mellitus, dyslipidemia, obesity and sedentary lifestyle. Hypertensive end-organ damage includes CAD/MI and retinopathy.    Review of Systems  Constitutional: Positive for fatigue. Negative for chills, fever and unexpected weight change.  HENT: Negative for trouble swallowing and voice change.   Eyes: Negative for visual disturbance.  Respiratory: Negative for cough, shortness of breath and wheezing.   Cardiovascular: Negative for chest pain, palpitations and leg swelling.  Gastrointestinal: Negative for diarrhea, nausea and vomiting.  Endocrine: Negative for cold intolerance, heat intolerance, polydipsia, polyphagia and polyuria.  Musculoskeletal: Negative for arthralgias and myalgias.  Skin: Negative for color change, pallor, rash and wound.  Neurological: Negative for seizures and headaches.  Psychiatric/Behavioral: Negative for confusion and suicidal ideas.    Objective:    BP 128/77   Pulse 71   Ht 5\' 4"  (1.626 m)   Wt 228 lb (103.4 kg)   BMI 39.14 kg/m   Wt Readings from Last 3 Encounters:  11/11/16 228 lb (103.4 kg)  10/16/16 230 lb (104.3 kg)  08/20/16 225 lb (102.1 kg)    Physical Exam  Constitutional: She is oriented to person, place, and time. She appears well-developed.  HENT:  Head: Normocephalic and atraumatic.  She has very poor dentition.  Eyes: EOM are normal.  Neck: Normal range of motion. Neck supple. No tracheal deviation present. No thyromegaly present.  Cardiovascular: Normal rate and regular rhythm.    Pulmonary/Chest: Effort normal and breath sounds normal.  Abdominal: Soft. Bowel sounds are normal. There is no tenderness. There is no guarding.  Musculoskeletal: Normal range of motion. She exhibits no edema.  Neurological: She is alert and oriented to person, place, and time. She has normal reflexes. No cranial nerve deficit. Coordination normal.  Skin: Skin is warm and dry. No rash noted. No erythema. No pallor.  Psychiatric: She has a normal mood and affect. Judgment normal.    CMP     Component Value Date/Time   NA 143 11/06/2016 1112   K 4.5 11/06/2016 1112   CL 107 (H) 11/06/2016 1112   CO2 23 11/06/2016 1112   GLUCOSE 199 (H) 11/06/2016 1112   GLUCOSE  114 (H) 01/08/2016 1529   BUN 11 11/06/2016 1112   CREATININE 0.74 11/06/2016 1112   CREATININE 0.68 01/08/2016 1529   CALCIUM 8.9 11/06/2016 1112   PROT 5.5 (L) 11/06/2016 1112   ALBUMIN 3.4 (L) 11/06/2016 1112   AST 8 11/06/2016 1112   ALT 8 11/06/2016 1112   ALKPHOS 76 11/06/2016 1112   BILITOT 0.3 11/06/2016 1112   GFRNONAA 86 11/06/2016 1112   GFRNONAA >89 01/08/2016 1529   GFRAA 100 11/06/2016 1112   GFRAA >89 01/08/2016 1529   Diabetic Labs (most recent): Lab Results  Component Value Date   HGBA1C 7.6 (H) 07/31/2016   HGBA1C 7.6 (H) 04/15/2016   HGBA1C 8.0 (H) 01/08/2016    Lipid Panel     Component Value Date/Time   CHOL 184 04/15/2016 1121   TRIG 284 (H) 04/15/2016 1121   HDL 77 04/15/2016 1121   CHOLHDL 2.7 07/05/2015 0937   VLDL 53 (H) 07/05/2015 0937   LDLCALC 50 04/15/2016 1121     Assessment & Plan:   1. Type 2 diabetes mellitus with vascular disease ([place])  - Patient has currently uncontrolled symptomatic type 2 DM since  66 years of age. Her most recent labs did not include A1c, during her last visit A1c was 7.6% generally improving from 8.6 %. Recent labs reviewed. -She came with controlled  fasting blood glucose profile mainly .   Her diabetes is complicated by coronary artery  disease status post stent placement and patient remains at a high risk for more acute and chronic complications of diabetes which include CAD, CVA, CKD, retinopathy, and neuropathy. These are all discussed in detail with the patient.  - I have counseled the patient on diet management and weight loss, by adopting a carbohydrate restricted/protein rich diet. - She came with overall weight loss of 30 pounds of weight.   - Suggestion is made for her to avoid simple carbohydrates  from her diet including Cakes, Sweet Desserts, Ice Cream, Soda (diet and regular), Sweet Tea, Candies, Chips, Cookies, Store Bought Juices, Alcohol in Excess of  1-2 drinks a day, Artificial Sweeteners, and "Sugar-free" Products. This will help patient to have stable blood glucose profile and potentially avoid unintended weight gain.   - I encouraged the patient to switch to  unprocessed or minimally processed complex starch and increased protein intake (animal or plant source), fruits, and vegetables.  - Patient is advised to stick to a routine mealtimes to eat 3 meals  a day and avoid unnecessary snacks ( to snack only to correct hypoglycemia).   - I have approached patient with the following individualized plan to manage diabetes and patient agrees:   - She has normal renal function. -I  advised her to continue Lantus 40 units daily at bedtime and  continue to hold off prandial insulin Humalog for now .   - I advised her to continue monitoring of blood glucose before meals breakfast and at bedtime.  - Patient is warned not to take insulin without proper monitoring per orders. -Adjustment parameters are given for hypo and hyperglycemia in writing. -Patient is encouraged to call clinic for blood glucose levels less than 70 or above 200 mg /dl. -She mentions allergy to a number of use for medications unfortunately, Victoza, Byetta. - I will continue  metformin 500 mg by mouth twice a day. -She will continue Invokana 100  mg by mouth every morning. -Side effects and precautions discussed with hershe was treated for yeast infection with Diflucan once  prior to her last visit. - Patient specific target  A1c;  LDL, HDL, Triglycerides, and  Waist Circumference were discussed in detail.  2) BP/HTN: Controlled. Continue current medications . 3) Lipids/HPL:  Uncontrolled with triglycerides of 267 improving from  902, continue  Lipitor 40 mg by mouth daily at bedtime. Better control of diabetes will help for triglycerides. 4)  Weight/Diet: CDE Consult in progress  , exercise, and detailed carbohydrates information provided.  5) Chronic Care/Health Maintenance:  -Patient is on Statin medications and encouraged to continue to follow up with Ophthalmology, Podiatrist at least yearly or according to recommendations, and advised to   stay away from smoking. I have recommended yearly flu vaccine and pneumonia vaccination at least every 5 years; moderate intensity exercise for up to 150 minutes weekly; and  sleep for at least 7 hours a day.  -  Time spent with the patient: 25 min, of which >50% was spent in reviewing her sugar logs , discussing her hypo- and hyper-glycemic episodes, reviewing her current and  previous labs and insulin doses and developing a plan to avoid hypo- and hyper-glycemia.    - Patient to bring meter and  blood glucose logs during her next visit.   - I advised patient to maintain close follow up with Glenda Chroman, MD for primary care needs.  Follow up plan: - Return in about 3 months (around 02/11/2017) for follow up with pre-visit labs, meter, and logs.  Glade Lloyd, MD Phone: [phone]  Fax: [phone]  This note was partially dictated with voice recognition software. Similar sounding words can be transcribed inadequately or may not  be corrected upon review.  11/11/2016, 11:37 AM

## 2016-11-25 ENCOUNTER — Encounter: Payer: Self-pay | Admitting: Registered Nurse

## 2016-11-25 ENCOUNTER — Encounter: Payer: Medicaid Other | Attending: Physical Medicine & Rehabilitation | Admitting: Registered Nurse

## 2016-11-25 VITALS — BP 116/74 | HR 74

## 2016-11-25 DIAGNOSIS — J449 Chronic obstructive pulmonary disease, unspecified: Secondary | ICD-10-CM | POA: Insufficient documentation

## 2016-11-25 DIAGNOSIS — Z794 Long term (current) use of insulin: Secondary | ICD-10-CM | POA: Insufficient documentation

## 2016-11-25 DIAGNOSIS — G894 Chronic pain syndrome: Secondary | ICD-10-CM

## 2016-11-25 DIAGNOSIS — I509 Heart failure, unspecified: Secondary | ICD-10-CM | POA: Diagnosis not present

## 2016-11-25 DIAGNOSIS — M79605 Pain in left leg: Secondary | ICD-10-CM | POA: Diagnosis not present

## 2016-11-25 DIAGNOSIS — F411 Generalized anxiety disorder: Secondary | ICD-10-CM

## 2016-11-25 DIAGNOSIS — E785 Hyperlipidemia, unspecified: Secondary | ICD-10-CM | POA: Diagnosis not present

## 2016-11-25 DIAGNOSIS — Z5181 Encounter for therapeutic drug level monitoring: Secondary | ICD-10-CM | POA: Diagnosis not present

## 2016-11-25 DIAGNOSIS — E1142 Type 2 diabetes mellitus with diabetic polyneuropathy: Secondary | ICD-10-CM | POA: Diagnosis not present

## 2016-11-25 DIAGNOSIS — Z79899 Other long term (current) drug therapy: Secondary | ICD-10-CM

## 2016-11-25 DIAGNOSIS — Z1401 Asymptomatic hemophilia A carrier: Secondary | ICD-10-CM | POA: Insufficient documentation

## 2016-11-25 DIAGNOSIS — R51 Headache: Secondary | ICD-10-CM | POA: Diagnosis not present

## 2016-11-25 DIAGNOSIS — M5416 Radiculopathy, lumbar region: Secondary | ICD-10-CM

## 2016-11-25 DIAGNOSIS — M5489 Other dorsalgia: Secondary | ICD-10-CM | POA: Diagnosis not present

## 2016-11-25 DIAGNOSIS — I2510 Atherosclerotic heart disease of native coronary artery without angina pectoris: Secondary | ICD-10-CM | POA: Diagnosis not present

## 2016-11-25 DIAGNOSIS — Z760 Encounter for issue of repeat prescription: Secondary | ICD-10-CM | POA: Insufficient documentation

## 2016-11-25 DIAGNOSIS — R079 Chest pain, unspecified: Secondary | ICD-10-CM | POA: Insufficient documentation

## 2016-11-25 DIAGNOSIS — R000 Tachycardia, unspecified: Secondary | ICD-10-CM | POA: Insufficient documentation

## 2016-11-25 DIAGNOSIS — I252 Old myocardial infarction: Secondary | ICD-10-CM | POA: Diagnosis not present

## 2016-11-25 DIAGNOSIS — M1990 Unspecified osteoarthritis, unspecified site: Secondary | ICD-10-CM | POA: Insufficient documentation

## 2016-11-25 DIAGNOSIS — M47816 Spondylosis without myelopathy or radiculopathy, lumbar region: Secondary | ICD-10-CM | POA: Diagnosis not present

## 2016-11-25 DIAGNOSIS — G4700 Insomnia, unspecified: Secondary | ICD-10-CM | POA: Insufficient documentation

## 2016-11-25 DIAGNOSIS — G4733 Obstructive sleep apnea (adult) (pediatric): Secondary | ICD-10-CM | POA: Insufficient documentation

## 2016-11-25 DIAGNOSIS — G8929 Other chronic pain: Secondary | ICD-10-CM | POA: Diagnosis not present

## 2016-11-25 DIAGNOSIS — K219 Gastro-esophageal reflux disease without esophagitis: Secondary | ICD-10-CM | POA: Insufficient documentation

## 2016-11-25 DIAGNOSIS — F418 Other specified anxiety disorders: Secondary | ICD-10-CM | POA: Diagnosis not present

## 2016-11-25 DIAGNOSIS — M47896 Other spondylosis, lumbar region: Secondary | ICD-10-CM | POA: Insufficient documentation

## 2016-11-25 DIAGNOSIS — M79604 Pain in right leg: Secondary | ICD-10-CM | POA: Diagnosis not present

## 2016-11-25 DIAGNOSIS — R002 Palpitations: Secondary | ICD-10-CM | POA: Diagnosis not present

## 2016-11-25 MED ORDER — MORPHINE SULFATE ER 30 MG PO TBCR: 30.0000 mg | EXTENDED_RELEASE_TABLET | Freq: Three times a day (TID) | ORAL | 0 refills | Status: DC

## 2016-11-25 NOTE — Progress Notes (Signed)
Subjective:    Patient ID: [name], female    DOB: 12-14-1950, 66 y.o.   MRN: [id number]  HPI: [name] is a 66year old female who returns for follow up appointmentfor chronic pain and medication refill. She states her pain is located in her lowerback radiating into herbilateral lower extremities and bilateral feet. She rates her pain 7. Her current exercise regime is performing chair exercises and walking short distances with her walker.   [name] states she still experiencing panic attacks and ask if she could resume her previous Xanax dose. She was educated again regarding being on benzodiazepines and opioids she verbalizes understanding. Discuss counseling and we can change her Xanax to Klonopin 0.5 mg BID, she verbalizes understanding, at this time she will continue with the xanax. . Instructed to call office if she would like to consider the above she verbalizes understanding. The above was discussed with Dr. Letta Pate and he agrees with plan.   Last UDS was on 06/30/2016 it was consistent.    Pain Inventory Average Pain 8 Pain Right Now 7 My pain is sharp, burning, dull, stabbing, tingling and aching  In the last 24 hours, has pain interfered with the following? General activity 7 Relation with others 8 Enjoyment of life 8 What TIME of day is your pain at its worst? morning and night Sleep (in general) Poor  Pain is worse with: walking, bending, sitting, standing and some activites Pain improves with: rest and medication Relief from Meds: 2  Mobility use a walker how many minutes can you walk? 5 ability to climb steps?  no do you drive?  yes  Function I need assistance with the following:  meal prep, household duties and shopping  Neuro/Psych bladder control problems bowel control problems numbness tingling spasms dizziness depression anxiety  Prior Studies Any changes since last visit?  no  Physicians involved in your care Any  changes since last visit?  no   Family History  Problem Relation Age of Onset  . Heart attack Mother   . Colon cancer Mother   . Heart attack Father   . Stroke Sister   . Hemophilia Child   . Cancer Unknown   . Coronary artery disease Unknown   . Cancer Brother   . Cancer Maternal Aunt   . Cancer Maternal Uncle   . Cancer Paternal Aunt   . Cancer Paternal Uncle   . Cancer Brother    Social History   Social History  . Marital status: Legally Separated    Spouse name: N/A  . Number of children: 4  . Years of education: N/A   Occupational History  . Disabled    Social History Main Topics  . Smoking status: Never Smoker  . Smokeless tobacco: Never Used  . Alcohol use No  . Drug use: No  . Sexual activity: Not on file   Other Topics Concern  . Not on file   Social History Narrative   Patient does not get regular exercise   Denies caffeine use    Past Surgical History:  Procedure Laterality Date  . ABDOMINAL HYSTERECTOMY    . Arm Surgery Bilateral    due to fall-pt broke both forearms  . BIOPSY N/A 07/01/2013   Procedure: BIOPSY;  Surgeon: Rogene Houston, MD;  Location: [facility];  Service: Endoscopy;  Laterality: N/A;  . CHOLECYSTECTOMY    . COLONOSCOPY  08/06/2011   Procedure: COLONOSCOPY;  Surgeon: Rogene Houston, MD;  [facility];  Service: Endoscopy;  Laterality: N/A;  215  . COLONOSCOPY WITH PROPOFOL N/A 12/31/2012   Procedure: COLONOSCOPY WITH PROPOFOL;  Surgeon: Rogene Houston, MD;  Location: [facility];  Service: Endoscopy;  Laterality: N/A;  in cecum at 0814, total withdrawal time 22min  . CORONARY ANGIOPLASTY WITH STENT PLACEMENT    . ESOPHAGEAL DILATION N/A 06/07/2015   Procedure: ESOPHAGEAL DILATION;  Surgeon: Rogene Houston, MD;  [facility];  Service: Endoscopy;  Laterality: N/A;  . ESOPHAGOGASTRODUODENOSCOPY N/A 06/07/2015   Procedure: ESOPHAGOGASTRODUODENOSCOPY (EGD);  Surgeon: Rogene Houston, MD;  [facility];   Service: Endoscopy;  Laterality: N/A;  2:45 - moved to 1:00 - Ann to notify pt  . ESOPHAGOGASTRODUODENOSCOPY (EGD) WITH PROPOFOL N/A 12/31/2012   Procedure: ESOPHAGOGASTRODUODENOSCOPY (EGD) WITH PROPOFOL;  Surgeon: Rogene Houston, MD;  Location: [facility];  Service: Endoscopy;  Laterality: N/A;  GE junction 37,  . ESOPHAGOGASTRODUODENOSCOPY (EGD) WITH PROPOFOL N/A 07/01/2013   Procedure: ESOPHAGOGASTRODUODENOSCOPY (EGD) WITH PROPOFOL;  Surgeon: Rogene Houston, MD;  [facility]: [facility];  Service: Endoscopy;  Laterality: N/A;  950  . MALONEY DILATION N/A 12/31/2012   Procedure: MALONEY DILATION;  Surgeon: Rogene Houston, MD;  Location: [facility];  Service: Endoscopy;  Laterality: N/A;  used a # 54,#56  . MALONEY DILATION N/A 07/01/2013   Procedure: Venia Minks DILATION;  Surgeon: Rogene Houston, MD;  Location: [facility];  Service: Endoscopy;  Laterality: N/A;  54/58; no heme present   Past Medical History:  Diagnosis Date  . Anxiety   . Anxiety and depression   . Arthritis   . CAD (coronary artery disease)    Stent placement circumflex coronary 2007, catheterization 2008 patent stents. Normal LV function  . Chest pain   . CHF (congestive heart failure) ([place])   . COPD (chronic obstructive pulmonary disease) ([place])   . Depression   . Diabetes mellitus    Insulin dependent  . Diabetic polyneuropathy (HCC)     severe on multiple medications  . Dyslipidemia   . GERD (gastroesophageal reflux disease)   . Headache(784.0)   . 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 Hypertension   . MI (myocardial infarction) ([place])    2007  . Obstructive sleep apnea    CPAP machine at night  . Palpitations   . Tachycardia    There were no vitals taken for this visit.  Opioid Risk Score:  1 Fall Risk Score:  `1  Depression screen PHQ 2/9  Depression screen [facility] 2/9 11/11/2016 10/28/2016 08/07/2016 05/14/2016 10/11/2015 07/12/2015 07/12/2015  Decreased Interest 0 2 0 0 0 0 0  Down, Depressed, Hopeless 0 - 0 0 0 0 0  PHQ - 2 Score 0 2 0  0 0 0 0  Altered sleeping - - - - - - -  Tired, decreased energy - - - - - - -  Change in appetite - - - - - - -  Feeling bad or failure about yourself  - - - - - - -  Trouble concentrating - - - - - - -  Moving slowly or fidgety/restless - - - - - - -  Suicidal thoughts - - - - - - -  PHQ-9 Score - - - - - - -  Some recent data might be hidden   Review of Systems  Constitutional: Negative.   HENT: Negative.   Eyes: Negative.   Respiratory: Positive for apnea, shortness of breath and wheezing.  Gastrointestinal: Positive for abdominal pain, constipation, diarrhea and nausea.  Endocrine: Negative.   Genitourinary:       Bladder control  Musculoskeletal:       Spasms  Allergic/Immunologic: Negative.   Neurological: Positive for dizziness and numbness.       Tingling  Hematological: Negative.   Psychiatric/Behavioral: Positive for dysphoric mood. The patient is nervous/anxious.   All other systems reviewed and are negative.      Objective:   Physical Exam  Constitutional: She is oriented to person, place, and time. She appears well-developed and well-nourished.  HENT:  Head: Normocephalic and atraumatic.  Neck: Normal range of motion. Neck supple.  Cardiovascular: Normal rate and regular rhythm.   Pulmonary/Chest: Effort normal and breath sounds normal.  Musculoskeletal:  Normal Muscle Bulk and Muscle Testing Reveals:  Upper Extremities: Full ROM and Muscle Strength 5/5 Thoracic Paraspinal Tenderness: T-7-T-9 Lumbar Paraspinal Tenderness: L-3-L-5 Lower Extremities: Full ROM and Muscle Strength 5/5 Bilateral lower Extremity Flexion Produces Pain into Bilateral Lower Extremities Arises from Table slowly using walker for support Narrow Based Gait  Neurological: She is alert and oriented to person, place, and time.  Skin: Skin is warm and dry.  Psychiatric: She has a normal mood and affect.  Nursing note and vitals reviewed.         Assessment & Plan:  1.Lumbar  pain lumbar spondylosis: Encouraged to increase Activity as tolerated. 11/25/2016 Refilled: MS Contin 30 MG one tablet every 8 hours #90. We will continue the opioid monitoring program, this consists of regular clinic visits, examinations, urine drug screen, pill counts as well as use of [place] Controlled Substance Reporting System. 2. Lumbar Radiculitis: Continue Gabapentin. 11/25/2016 3. Bilateral Knee Pain/ Degenerative: Continue Voltaren Gel. Continue to Monitor. 11/25/2016. 4. Severe diabetic poly neuropathy: Continue Gabapentin. 11/25/2016 5. Insomnia: ContinueTrazodone PCP Following. 11/25/2016 6. Anxiety/ Depression: Continue Cymbalta and  Xanax 0.25mg  QID. 11/25/2016  30 minutes of face to face patient care time was spent during this visit. All questions were encouraged and answered.  F/U in 1 month

## 2016-11-25 NOTE — Patient Instructions (Signed)
Increase Gabapentin to Two Capsules ( 200 mg) at bedtime. Continue one capsule twice a day

## 2016-12-01 LAB — DRUG TOX MONITOR 1 W/CONF, ORAL FLD
Alprazolam: 0.83 ng/mL — ABNORMAL HIGH (ref <0.50)
Amphetamines: NEGATIVE ng/mL (ref <10)
Barbiturates: NEGATIVE ng/mL (ref <10)
Benzodiazepines: POSITIVE ng/mL — AB (ref <0.50)
Buprenorphine: NEGATIVE ng/mL (ref <0.025)
Chlordiazepoxide: NEGATIVE ng/mL (ref <0.50)
Clonazepam: NEGATIVE ng/mL (ref <0.50)
Cocaine: NEGATIVE ng/mL (ref <2.5)
Diazepam: NEGATIVE ng/mL (ref <0.50)
Fentanyl: NEGATIVE ng/mL (ref <0.10)
Flunitrazepam: NEGATIVE ng/mL (ref <0.50)
Flurazepam: NEGATIVE ng/mL (ref <0.50)
Heroin Metabolite: NEGATIVE ng/mL (ref <1.0)
Lorazepam: NEGATIVE ng/mL (ref <0.50)
MARIJUANA: NEGATIVE ng/mL (ref <2.5)
MDMA: NEGATIVE ng/mL (ref <10)
Meperidine: NEGATIVE ng/mL (ref <5.0)
Meprobamate: NEGATIVE ng/mL (ref <2.5)
Methadone: NEGATIVE ng/mL (ref <5.0)
Midazolam: NEGATIVE ng/mL (ref <0.50)
Nicotine Metabolite: NEGATIVE ng/mL (ref <5.0)
Nordiazepam: NEGATIVE ng/mL (ref <0.50)
Opiates: NEGATIVE ng/mL (ref <2.5)
Oxazepam: NEGATIVE ng/mL (ref <0.50)
Phencyclidine: NEGATIVE ng/mL (ref <10)
Propoxyphene: NEGATIVE ng/mL (ref <5.0)
Tapentadol: NEGATIVE ng/mL (ref <5.0)
Temazepam: NEGATIVE ng/mL (ref <0.50)
Tramadol: NEGATIVE ng/mL (ref <5.0)
Triazolam: NEGATIVE ng/mL (ref <0.50)
Zolpidem: NEGATIVE ng/mL (ref <5.0)

## 2016-12-01 LAB — DRUG TOX ALC METAB W/CON, ORAL FLD: Alcohol Metabolite: NEGATIVE ng/mL (ref <25)

## 2016-12-02 ENCOUNTER — Encounter: Payer: Self-pay | Admitting: Sports Medicine

## 2016-12-02 ENCOUNTER — Ambulatory Visit (INDEPENDENT_AMBULATORY_CARE_PROVIDER_SITE_OTHER): Payer: Medicaid Other | Admitting: Sports Medicine

## 2016-12-02 VITALS — BP 130/66 | HR 72 | Resp 16

## 2016-12-02 DIAGNOSIS — E1142 Type 2 diabetes mellitus with diabetic polyneuropathy: Secondary | ICD-10-CM

## 2016-12-02 DIAGNOSIS — M79674 Pain in right toe(s): Secondary | ICD-10-CM

## 2016-12-02 DIAGNOSIS — M2041 Other hammer toe(s) (acquired), right foot: Secondary | ICD-10-CM

## 2016-12-02 DIAGNOSIS — L84 Corns and callosities: Secondary | ICD-10-CM | POA: Diagnosis not present

## 2016-12-02 NOTE — Progress Notes (Signed)
Subjective: [name] is a 66 y.o. female patient who presents to office for evaluation of Right 5th toe at corn. Patient denies any other pedal complaints.   Patient Active Problem List   Diagnosis Date Noted  . Essential hypertension, benign 05/14/2015  . Morbid obesity due to excess calories ([place]) 05/02/2015  . Dysphagia 04/24/2015  . Arthrosis of right acromioclavicular joint 11/13/2014  . Spondylosis of lumbar region without myelopathy or radiculopathy 11/13/2014  . Painful diabetic neuropathy ([place]) 06/26/2014  . Diarrhea 03/02/2013  . Rectal irritation 03/02/2013  . Chronic low back pain without sciatica 07/29/2012  . Muscle weakness (generalized) 07/29/2012  . Spinal stenosis, lumbar region, without neurogenic claudication 07/19/2012  . Irritable bowel syndrome 08/24/2011  . Pancreatitis 10/17/2010  . CAD (coronary artery disease)   . Type 2 diabetes mellitus with vascular disease ([place])   . Diabetic polyneuropathy ([place])   . Anxiety and depression   . Mixed hyperlipidemia 09/20/2009  . Chronic diastolic heart failure ([place]) 09/20/2009  . OBSTRUCTIVE SLEEP APNEA 09/04/2009  . TACHYCARDIA 11/08/2008  . PALPITATIONS 11/08/2008  . CHEST PAIN-UNSPECIFIED 11/08/2008    Current Outpatient Prescriptions on File Prior to Visit  Medication Sig Dispense Refill  . ACCU-CHEK AVIVA PLUS test strip USE TO CHECK BLOOD SUGAR THREE TIMES DAILY - DX: E11.9 100 each 5  . ALPRAZolam (XANAX) 0.25 MG tablet Take 1 tablet (0.25 mg total) by mouth 4 (four) times daily as needed for anxiety. 120 tablet 2  . aspirin EC 81 MG tablet Take 81 mg by mouth daily.    Marland Kitchen atorvastatin (LIPITOR) 40 MG tablet Take 40 mg by mouth daily.    . beclomethasone (QVAR) 40 MCG/ACT inhaler Inhale 1 puff into the lungs 2 (two) times daily.    Marland Kitchen buPROPion (WELLBUTRIN SR) 100 MG 12 hr tablet Take 100 mg by mouth daily.    . diclofenac sodium (VOLTAREN) 1 % GEL APPLY 2 GRAMS ON BOTH KNEES THREE TIMES DAILY 3 Tube 2  .  DULoxetine (CYMBALTA) 30 MG capsule Take 1 capsule (30 mg total) by mouth daily. 30 capsule 0  . estrogens, conjugated, (PREMARIN) 1.25 MG tablet Take 1.25 mg by mouth daily.     . fluticasone-salmeterol (ADVAIR HFA) 115-21 MCG/ACT inhaler Inhale 2 puffs into the lungs 2 (two) times daily.    Marland Kitchen gabapentin (NEURONTIN) 100 MG capsule Take 1 capsule (100 mg total) by mouth 3 (three) times daily. 90 capsule 6  . GLOBAL EASE INJECT PEN NEEDLES 31G X 8 MM MISC USE AS DIRECTED FOUR TIMES DAILY 100 each 3  . ibuprofen (ADVIL,MOTRIN) 200 MG tablet Take 200 mg by mouth daily as needed for headache.    . INVOKANA 100 MG TABS tablet TAKE ONE TABLET BY MOUTH EVERY MORNING 30 tablet 2  . LANTUS SOLOSTAR 100 UNIT/ML Solostar Pen INJECT 50 UNITS INTO THE SKIN AT BEDTIME. (Patient taking differently: Inject 40 Units into the skin at bedtime. ) 15 mL 2  . lidocaine (XYLOCAINE) 5 % ointment Apply 1 application topically at bedtime.     . metFORMIN (GLUCOPHAGE) 500 MG tablet TAKE ONE TABLET BY MOUTH TWICE DAILY with a meal. 60 tablet 2  . methocarbamol (ROBAXIN) 500 MG tablet Take 1 tablet (500 mg total) by mouth 3 (three) times daily. 45 tablet 0  . metoprolol succinate (TOPROL-XL) 50 MG 24 hr tablet TAKE 1 AND 1/2 TABLETS DAILY 75MG  135 tablet 1  . morphine (MS CONTIN) 30 MG 12 hr tablet Take 1 tablet (30  mg total) by mouth every 8 (eight) hours. (Patient taking differently: Take 30 mg by mouth every 8 (eight) hours. Takes for neuropathy and for back pain) 90 tablet 0  . nitroGLYCERIN (NITROSTAT) 0.4 MG SL tablet Place 1 tablet (0.4 mg total) under the tongue every 5 (five) minutes x 3 doses as needed. For chest pains 25 tablet 3  . ondansetron (ZOFRAN) 4 MG tablet Take 4 mg by mouth every 6 (six) hours as needed for nausea or vomiting.    . pantoprazole (PROTONIX) 40 MG tablet TAKE 1 TABLET BY MOUTH TWICE DAILY BEFORE MEALS 60 tablet 5  . psyllium (METAMUCIL SMOOTH TEXTURE) 28 % packet Take 1 packet by mouth at  bedtime.    . [DISCONTINUED] diltiazem (TIAZAC) 180 MG 24 hr capsule Take 1 capsule (180 mg total) by mouth daily. 30 capsule 1  . [DISCONTINUED] potassium chloride (K-DUR) 10 MEQ tablet Take 2 tablets (20 mEq total) by mouth 2 (two) times daily.     No current facility-administered medications on file prior to visit.     Allergies  Allergen Reactions  . Nitrofuran Derivatives Itching and Swelling  . Amphetamine-Dextroamphet Er Swelling  . Pregabalin Swelling  . Topiramate Other (See Comments)    Tongue tingle  . Verelan [Verapamil] Rash    Objective:  General: Alert and oriented x3 in no acute distress  Dermatology: Keratotic lesion present medial 5th toe on right, there is no malodor, no active drainage, no erythema, no edema. No acute signs of infection.   Vascular: Dorsalis Pedis pulse = 1/4 Bilateral,  Posterior Tibial pulse = 1/4 Bilateral,  Capillary Fill Time < 5 seconds  Neurologic: Protective sensation diminished bilateral using  the 5.07/10g Semmes Weinstein Monofilament.  Musculosketal: There is decreased ankle joint range of motion Bilateral. There is decreased Subtalar joint range of motion Bilateral. There is a decrease in 1st metatarsophalangeal joint range of motion Bilateral with tenderness to Left 1st MTPJ.No pain on left however ocassional shooting pain and pain to right 5th toe. No pain with compression to calves bilateral. No symptomatic gross bony deformities noted bilateral..  Assessment and Plan: Problem List Items Addressed This Visit    None    Visit Diagnoses    [place] of toe    -  Primary   Acquired hammertoe of right foot       Toe pain, right       Diabetic polyneuropathy associated with type 2 diabetes mellitus ([place])         -Complete examination performed -Discussed treatment options for hammertoe -Parred keratoic lesion using a chisel blade; treated the area withSalinocaine covered with moleskin -Dispensed toe cap -Advised good supportive  shoes and inserts -Patient to return to office as needed or sooner if condition worsens.Advised patient if pain recurs may benefit from steriod injection at inflammaed callus at 5th toe on right  Landis Martins, DPM

## 2016-12-03 ENCOUNTER — Telehealth: Payer: Self-pay | Admitting: *Deleted

## 2016-12-03 NOTE — Telephone Encounter (Signed)
Oral swab drug screen was inconsistent for prescribed medications. Her alprazolam was present but there was no Morphine or metabolite present though she reported her last dose taken was the morning of the test. She also had #16 pills on day of test.  This is the first oral swab for [name]. Her previous urine had both morphine and its metabolite present.

## 2016-12-11 NOTE — Telephone Encounter (Signed)
Discussed with [name].  Will need to repeat.

## 2016-12-24 ENCOUNTER — Encounter: Payer: Medicaid Other | Attending: Physical Medicine & Rehabilitation | Admitting: Registered Nurse

## 2016-12-24 ENCOUNTER — Encounter: Payer: Self-pay | Admitting: Registered Nurse

## 2016-12-24 ENCOUNTER — Other Ambulatory Visit: Payer: Self-pay | Admitting: *Deleted

## 2016-12-24 VITALS — BP 115/73 | HR 71

## 2016-12-24 DIAGNOSIS — R079 Chest pain, unspecified: Secondary | ICD-10-CM | POA: Diagnosis not present

## 2016-12-24 DIAGNOSIS — F418 Other specified anxiety disorders: Secondary | ICD-10-CM | POA: Insufficient documentation

## 2016-12-24 DIAGNOSIS — E785 Hyperlipidemia, unspecified: Secondary | ICD-10-CM | POA: Diagnosis not present

## 2016-12-24 DIAGNOSIS — Z1401 Asymptomatic hemophilia A carrier: Secondary | ICD-10-CM | POA: Insufficient documentation

## 2016-12-24 DIAGNOSIS — M5416 Radiculopathy, lumbar region: Secondary | ICD-10-CM

## 2016-12-24 DIAGNOSIS — G894 Chronic pain syndrome: Secondary | ICD-10-CM

## 2016-12-24 DIAGNOSIS — M7062 Trochanteric bursitis, left hip: Secondary | ICD-10-CM

## 2016-12-24 DIAGNOSIS — I509 Heart failure, unspecified: Secondary | ICD-10-CM | POA: Insufficient documentation

## 2016-12-24 DIAGNOSIS — M5489 Other dorsalgia: Secondary | ICD-10-CM | POA: Diagnosis not present

## 2016-12-24 DIAGNOSIS — R002 Palpitations: Secondary | ICD-10-CM | POA: Diagnosis not present

## 2016-12-24 DIAGNOSIS — M47896 Other spondylosis, lumbar region: Secondary | ICD-10-CM | POA: Insufficient documentation

## 2016-12-24 DIAGNOSIS — M25562 Pain in left knee: Secondary | ICD-10-CM | POA: Diagnosis not present

## 2016-12-24 DIAGNOSIS — Z5181 Encounter for therapeutic drug level monitoring: Secondary | ICD-10-CM

## 2016-12-24 DIAGNOSIS — I2510 Atherosclerotic heart disease of native coronary artery without angina pectoris: Secondary | ICD-10-CM | POA: Insufficient documentation

## 2016-12-24 DIAGNOSIS — K219 Gastro-esophageal reflux disease without esophagitis: Secondary | ICD-10-CM | POA: Insufficient documentation

## 2016-12-24 DIAGNOSIS — R000 Tachycardia, unspecified: Secondary | ICD-10-CM | POA: Diagnosis not present

## 2016-12-24 DIAGNOSIS — R51 Headache: Secondary | ICD-10-CM | POA: Insufficient documentation

## 2016-12-24 DIAGNOSIS — M79604 Pain in right leg: Secondary | ICD-10-CM | POA: Diagnosis not present

## 2016-12-24 DIAGNOSIS — G4700 Insomnia, unspecified: Secondary | ICD-10-CM | POA: Diagnosis not present

## 2016-12-24 DIAGNOSIS — M25561 Pain in right knee: Secondary | ICD-10-CM

## 2016-12-24 DIAGNOSIS — F411 Generalized anxiety disorder: Secondary | ICD-10-CM

## 2016-12-24 DIAGNOSIS — J449 Chronic obstructive pulmonary disease, unspecified: Secondary | ICD-10-CM | POA: Diagnosis not present

## 2016-12-24 DIAGNOSIS — M1990 Unspecified osteoarthritis, unspecified site: Secondary | ICD-10-CM | POA: Diagnosis not present

## 2016-12-24 DIAGNOSIS — Z79899 Other long term (current) drug therapy: Secondary | ICD-10-CM | POA: Diagnosis not present

## 2016-12-24 DIAGNOSIS — F3289 Other specified depressive episodes: Secondary | ICD-10-CM | POA: Diagnosis not present

## 2016-12-24 DIAGNOSIS — G8929 Other chronic pain: Secondary | ICD-10-CM | POA: Insufficient documentation

## 2016-12-24 DIAGNOSIS — M79605 Pain in left leg: Secondary | ICD-10-CM | POA: Diagnosis not present

## 2016-12-24 DIAGNOSIS — I252 Old myocardial infarction: Secondary | ICD-10-CM | POA: Diagnosis not present

## 2016-12-24 DIAGNOSIS — M7061 Trochanteric bursitis, right hip: Secondary | ICD-10-CM

## 2016-12-24 DIAGNOSIS — Z794 Long term (current) use of insulin: Secondary | ICD-10-CM | POA: Diagnosis not present

## 2016-12-24 DIAGNOSIS — G4733 Obstructive sleep apnea (adult) (pediatric): Secondary | ICD-10-CM | POA: Diagnosis not present

## 2016-12-24 DIAGNOSIS — E1142 Type 2 diabetes mellitus with diabetic polyneuropathy: Secondary | ICD-10-CM | POA: Diagnosis not present

## 2016-12-24 DIAGNOSIS — Z760 Encounter for issue of repeat prescription: Secondary | ICD-10-CM | POA: Insufficient documentation

## 2016-12-24 MED ORDER — DULOXETINE HCL 30 MG PO CPEP: 30.0000 mg | ORAL_CAPSULE | Freq: Every day | ORAL | 3 refills | Status: DC

## 2016-12-24 MED ORDER — CLONAZEPAM 0.5 MG PO TABS: 0.5000 mg | ORAL_TABLET | Freq: Two times a day (BID) | ORAL | 0 refills | Status: DC | PRN

## 2016-12-24 MED ORDER — MORPHINE SULFATE ER 30 MG PO TBCR: 30.0000 mg | EXTENDED_RELEASE_TABLET | Freq: Three times a day (TID) | ORAL | 0 refills | Status: DC

## 2016-12-24 NOTE — Progress Notes (Signed)
Subjective:    Patient ID: [name], female    DOB: 1950/11/12, 66 y.o.   MRN: [id number]  HPI: [name] is a 66year old female who returns for follow up appointmentfor chronic pain and medication refill. She states her pain is located in her bilateral shoulders, mid- lowerback radiating into herbilateral lower extremities and bilateral feet Also reports bilateral hip  And bilateral knee pain. She rates her pain 7. Her current exercise regime is performing chair exercises and walking short distances with her walker.   We Reviewed the PMP Aware website, all questions answered.   [name] Morphine equivalent is 90 MME.  She is also prescribed Xanax .We have discussed the black box warning of using opioids and benzodiazepines. I highlighted the dangers of using these drugs together and discussed the adverse events including respiratory suppression, overdose, cognitive impairment and importance of  compliance with current regimen. She verbalizes understanding, we will continue to monitor and adjust as indicated.   [name] has been changed to Klonopin prescription printed today, we will evaluate medication change. She verbalizes understanding.    UDS was performed today.   Pain Inventory Average Pain 8 Pain Right Now 7 My pain is sharp, burning, stabbing, tingling and aching  In the last 24 hours, has pain interfered with the following? General activity 5 Relation with others 7 Enjoyment of life 7 What TIME of day is your pain at its worst? morning and night Sleep (in general) Poor  Pain is worse with: walking, bending, sitting, inactivity, standing and some activites Pain improves with: medication Relief from Meds: 3  Mobility use a walker how many minutes can you walk? 5 ability to climb steps?  no do you drive?  yes  Function I need assistance with the following:  dressing, bathing, meal prep, household duties and  shopping  Neuro/Psych weakness numbness tingling spasms dizziness depression anxiety  Prior Studies Any changes since last visit?  no  Physicians involved in your care Any changes since last visit?  no   Family History  Problem Relation Age of Onset  . Heart attack Mother   . Colon cancer Mother   . Heart attack Father   . Stroke Sister   . Hemophilia Child   . Cancer Unknown   . Coronary artery disease Unknown   . Cancer Brother   . Cancer Maternal Aunt   . Cancer Maternal Uncle   . Cancer Paternal Aunt   . Cancer Paternal Uncle   . Cancer Brother    Social History   Social History  . Marital status: Legally Separated    Spouse name: N/A  . Number of children: 4  . Years of education: N/A   Occupational History  . Disabled    Social History Main Topics  . Smoking status: Never Smoker  . Smokeless tobacco: Never Used  . Alcohol use No  . Drug use: No  . Sexual activity: Not on file   Other Topics Concern  . Not on file   Social History Narrative   Patient does not get regular exercise   Denies caffeine use    Past Surgical History:  Procedure Laterality Date  . ABDOMINAL HYSTERECTOMY    . Arm Surgery Bilateral    due to fall-pt broke both forearms  . BIOPSY N/A 07/01/2013   Procedure: BIOPSY;  Surgeon: Rogene Houston, MD;  Location: [facility];  Service: Endoscopy;  Laterality: N/A;  . CHOLECYSTECTOMY    .  COLONOSCOPY  08/06/2011   Procedure: COLONOSCOPY;  Surgeon: Rogene Houston, MD;  [facility];  Service: Endoscopy;  Laterality: N/A;  215  . COLONOSCOPY WITH PROPOFOL N/A 12/31/2012   Procedure: COLONOSCOPY WITH PROPOFOL;  Surgeon: Rogene Houston, MD;  Location: [facility];  Service: Endoscopy;  Laterality: N/A;  in cecum at 0814, total withdrawal time 43min  . CORONARY ANGIOPLASTY WITH STENT PLACEMENT    . ESOPHAGEAL DILATION N/A 06/07/2015   Procedure: ESOPHAGEAL DILATION;  Surgeon: Rogene Houston, MD;  [facility];   Service: Endoscopy;  Laterality: N/A;  . ESOPHAGOGASTRODUODENOSCOPY N/A 06/07/2015   Procedure: ESOPHAGOGASTRODUODENOSCOPY (EGD);  Surgeon: Rogene Houston, MD;  [facility];  Service: Endoscopy;  Laterality: N/A;  2:45 - moved to 1:00 - Ann to notify pt  . ESOPHAGOGASTRODUODENOSCOPY (EGD) WITH PROPOFOL N/A 12/31/2012   Procedure: ESOPHAGOGASTRODUODENOSCOPY (EGD) WITH PROPOFOL;  Surgeon: Rogene Houston, MD;  Location: [facility];  Service: Endoscopy;  Laterality: N/A;  GE junction 37,  . ESOPHAGOGASTRODUODENOSCOPY (EGD) WITH PROPOFOL N/A 07/01/2013   Procedure: ESOPHAGOGASTRODUODENOSCOPY (EGD) WITH PROPOFOL;  Surgeon: Rogene Houston, MD;  Location: [facility];  Service: Endoscopy;  Laterality: N/A;  950  . MALONEY DILATION N/A 12/31/2012   Procedure: MALONEY DILATION;  Surgeon: Rogene Houston, MD;  Location: [facility];  Service: Endoscopy;  Laterality: N/A;  used a # 54,#56  . MALONEY DILATION N/A 07/01/2013   Procedure: Venia Minks DILATION;  Surgeon: Rogene Houston, MD;  Location: [facility];  Service: Endoscopy;  Laterality: N/A;  54/58; no heme present   Past Medical History:  Diagnosis Date  . Anxiety   . Anxiety and depression   . Arthritis   . CAD (coronary artery disease)    Stent placement circumflex coronary 2007, catheterization 2008 patent stents. Normal LV function  . Chest pain   . CHF (congestive heart failure) ([place])   . COPD (chronic obstructive pulmonary disease) ([place])   . Depression   . Diabetes mellitus    Insulin dependent  . Diabetic polyneuropathy (HCC)     severe on multiple medications  . Dyslipidemia   . GERD (gastroesophageal reflux disease)   . Headache(784.0)   . 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 Hypertension   . MI (myocardial infarction) ([place])    2007  . Obstructive sleep apnea    CPAP machine at night  . Palpitations   . Tachycardia    BP 115/73   Pulse 71   SpO2 93%   Opioid Risk Score:  1 Fall Risk Score:  `1  Depression screen PHQ 2/9  Depression  screen [facility] 2/9 12/24/2016 11/11/2016 10/28/2016 08/07/2016 05/14/2016 10/11/2015 07/12/2015  Decreased Interest 0 0 2 0 0 0 0  Down, Depressed, Hopeless 0 0 - 0 0 0 0  PHQ - 2 Score 0 0 2 0 0 0 0  Altered sleeping - - - - - - -  Tired, decreased energy - - - - - - -  Change in appetite - - - - - - -  Feeling bad or failure about yourself  - - - - - - -  Trouble concentrating - - - - - - -  Moving slowly or fidgety/restless - - - - - - -  Suicidal thoughts - - - - - - -  PHQ-9 Score - - - - - - -  Some recent data might be hidden   Review of Systems  Constitutional: Negative.   HENT:  Negative.   Eyes: Negative.   Respiratory: Positive for apnea, shortness of breath and wheezing.   Gastrointestinal: Positive for abdominal pain, constipation, diarrhea and nausea.  Endocrine: Negative.   Genitourinary:       Bladder control  Musculoskeletal:       Spasms  Allergic/Immunologic: Negative.   Neurological: Positive for dizziness and numbness.       Tingling  Hematological: Negative.   Psychiatric/Behavioral: Positive for dysphoric mood. The patient is nervous/anxious.   All other systems reviewed and are negative.      Objective:   Physical Exam  Constitutional: She is oriented to person, place, and time. She appears well-developed and well-nourished.  HENT:  Head: Normocephalic and atraumatic.  Neck: Normal range of motion. Neck supple.  Cardiovascular: Normal rate and regular rhythm.   Pulmonary/Chest: Effort normal and breath sounds normal.  Musculoskeletal:  Normal Muscle Bulk and Muscle Testing Reveals:  Upper Extremities: Full  ROM and Muscle Strength 5/5 Thoracic Hypersensitivity Lumbar Hypersensitivity Lower Extremities: Full  ROM and Muscle Strength 5/5 Arises from Table slowly using walker for support Narrow Based Gait  Neurological: She is alert and oriented to person, place, and time.  Skin: Skin is warm and dry.  Psychiatric: She has a normal mood and affect.   Nursing note and vitals reviewed.         Assessment & Plan:  1.Lumbar pain lumbar spondylosis: Encouraged to increase Activity as tolerated. 12/24/2016 Refilled: MS Contin 30 MG one tablet every 8 hours #90. We will continue the opioid monitoring program, this consists of regular clinic visits, examinations, urine drug screen, pill counts as well as use of [place] Controlled Substance Reporting System. 2. Lumbar Radiculitis: Continue Gabapentin. 12/24/2016 3. Bilateral Knee Pain/ Degenerative: Continue Voltaren Gel. Continue to Monitor. 12/24/2016. 4. Severe diabetic poly neuropathy: Continue Gabapentin. 12/24/2016 5. Insomnia: ContinueTrazodone PCP Following. 12/24/2016 6. Anxiety/ Depression: Continue Cymbalta and RX: Klonopin . 12/24/2016  30 minutes of face to face patient care time was spent during this visit. All questions were encouraged and answered.  F/U in 1 month

## 2016-12-31 LAB — 6-ACETYLMORPHINE,TOXASSURE ADD
6-ACETYLMORPHINE: NEGATIVE
6-acetylmorphine: NOT DETECTED ng/mg creat

## 2016-12-31 LAB — TOXASSURE SELECT,+ANTIDEPR,UR

## 2017-01-11 ENCOUNTER — Encounter: Payer: Self-pay | Admitting: Registered Nurse

## 2017-01-12 ENCOUNTER — Telehealth: Payer: Self-pay

## 2017-01-12 NOTE — Telephone Encounter (Signed)
Patient called stating that she can come on the 31 and wants to talk to [facility]

## 2017-01-12 NOTE — Telephone Encounter (Signed)
Return [name] call, her appointment has been re-scheduled to 01/14/2017. She verbalizes understanding.

## 2017-01-14 ENCOUNTER — Encounter (HOSPITAL_BASED_OUTPATIENT_CLINIC_OR_DEPARTMENT_OTHER): Payer: Medicaid Other | Admitting: Registered Nurse

## 2017-01-14 ENCOUNTER — Encounter: Payer: Self-pay | Admitting: Registered Nurse

## 2017-01-14 ENCOUNTER — Telehealth: Payer: Self-pay | Admitting: Registered Nurse

## 2017-01-14 VITALS — BP 124/78 | HR 73

## 2017-01-14 DIAGNOSIS — M25561 Pain in right knee: Secondary | ICD-10-CM

## 2017-01-14 DIAGNOSIS — E1142 Type 2 diabetes mellitus with diabetic polyneuropathy: Secondary | ICD-10-CM | POA: Diagnosis not present

## 2017-01-14 DIAGNOSIS — Z79899 Other long term (current) drug therapy: Secondary | ICD-10-CM | POA: Diagnosis not present

## 2017-01-14 DIAGNOSIS — Z760 Encounter for issue of repeat prescription: Secondary | ICD-10-CM | POA: Diagnosis not present

## 2017-01-14 DIAGNOSIS — M25562 Pain in left knee: Secondary | ICD-10-CM | POA: Diagnosis not present

## 2017-01-14 DIAGNOSIS — M5416 Radiculopathy, lumbar region: Secondary | ICD-10-CM | POA: Diagnosis not present

## 2017-01-14 DIAGNOSIS — G894 Chronic pain syndrome: Secondary | ICD-10-CM

## 2017-01-14 DIAGNOSIS — Z5181 Encounter for therapeutic drug level monitoring: Secondary | ICD-10-CM | POA: Diagnosis not present

## 2017-01-14 DIAGNOSIS — M7061 Trochanteric bursitis, right hip: Secondary | ICD-10-CM

## 2017-01-14 DIAGNOSIS — G8929 Other chronic pain: Secondary | ICD-10-CM | POA: Diagnosis not present

## 2017-01-14 DIAGNOSIS — F3289 Other specified depressive episodes: Secondary | ICD-10-CM

## 2017-01-14 DIAGNOSIS — M7062 Trochanteric bursitis, left hip: Secondary | ICD-10-CM | POA: Diagnosis not present

## 2017-01-14 MED ORDER — CLONAZEPAM 0.5 MG PO TABS: 0.5000 mg | ORAL_TABLET | Freq: Two times a day (BID) | ORAL | 2 refills | Status: DC | PRN

## 2017-01-14 MED ORDER — MORPHINE SULFATE ER 30 MG PO TBCR: 30.0000 mg | EXTENDED_RELEASE_TABLET | Freq: Three times a day (TID) | ORAL | 0 refills | Status: DC

## 2017-01-14 NOTE — Telephone Encounter (Signed)
On 01/14/2017 the [place] was reviewed no conflict was seen on the [place] with multiple prescribers. [name] has a signed narcotic contract with our office. If there were any discrepancies this would have been reported to her physician.

## 2017-01-14 NOTE — Progress Notes (Signed)
Subjective:    Patient ID: [name], female    DOB: 05/23/1950, 66 y.o.   MRN: [id number]  HPI: [name] is a 66year old female who returns for follow up appointmentfor chronic pain and medication refill. She states her pain is located in her neck, mid- lowerback radiating into herbilateral lower extremities and bilateral feet Also reports bilateral hip pain. She rates her pain 8. Her current exercise regime is performing chair exercises and walking short distances with her walker.   [name] forgot her medication PMP Aware site reviewed she picked up her medication on 12/24/2016, contract reviewed she verbalizes understanding.   [name] equivalent is 90 MME.  She is also prescribed Klonopin .We have discussed the black box warning of using opioids and benzodiazepines. I highlighted the dangers of using these drugs together and discussed the adverse events including respiratory suppression, overdose, cognitive impairment and importance of  compliance with current regimen. She verbalizes understanding, we will continue to monitor and adjust as indicated.     UDS was performed on 12/24/2016 it was consistent.   Pain Inventory Average Pain 8 Pain Right Now 8 My pain is sharp, burning, dull, stabbing, tingling and aching  In the last 24 hours, has pain interfered with the following? General activity 2 Relation with others 2 Enjoyment of life 2 What TIME of day is your pain at its worst? morning and night Sleep (in general) Fair  Pain is worse with: walking, bending, sitting, standing and some activites Pain improves with: heat/ice and medication Relief from Meds: 0  Mobility walk with assistance use a walker how many minutes can you walk? 5 ability to climb steps?  yes do you drive?  yes  Function I need assistance with the following:  dressing, bathing, meal prep, household duties and shopping  Neuro/Psych bladder control  problems weakness numbness tingling spasms dizziness depression anxiety  Prior Studies Any changes since last visit?  no  Physicians involved in your care Any changes since last visit?  no   Family History  Problem Relation Age of Onset  . Heart attack Mother   . Colon cancer Mother   . Heart attack Father   . Stroke Sister   . Hemophilia Child   . Cancer Unknown   . Coronary artery disease Unknown   . Cancer Brother   . Cancer Maternal Aunt   . Cancer Maternal Uncle   . Cancer Paternal Aunt   . Cancer Paternal Uncle   . Cancer Brother    Social History   Social History  . Marital status: Legally Separated    Spouse name: N/A  . Number of children: 4  . Years of education: N/A   Occupational History  . Disabled    Social History Main Topics  . Smoking status: Never Smoker  . Smokeless tobacco: Never Used  . Alcohol use No  . Drug use: No  . Sexual activity: Not on file   Other Topics Concern  . Not on file   Social History Narrative   Patient does not get regular exercise   Denies caffeine use    Past Surgical History:  Procedure Laterality Date  . ABDOMINAL HYSTERECTOMY    . Arm Surgery Bilateral    due to fall-pt broke both forearms  . BIOPSY N/A 07/01/2013   Procedure: BIOPSY;  Surgeon: Rogene Houston, MD;  Location: [facility];  Service: Endoscopy;  Laterality: N/A;  . CHOLECYSTECTOMY    .  COLONOSCOPY  08/06/2011   Procedure: COLONOSCOPY;  Surgeon: Rogene Houston, MD;  Location: [facility];  Service: Endoscopy;  Laterality: N/A;  215  . COLONOSCOPY WITH PROPOFOL N/A 12/31/2012   Procedure: COLONOSCOPY WITH PROPOFOL;  Surgeon: Rogene Houston, MD;  Location: [facility];  Service: Endoscopy;  Laterality: N/A;  in cecum at 0814, total withdrawal time 21min  . CORONARY ANGIOPLASTY WITH STENT PLACEMENT    . ESOPHAGEAL DILATION N/A 06/07/2015   Procedure: ESOPHAGEAL DILATION;  Surgeon: Rogene Houston, MD;  [facility]: [facility];  Service: Endoscopy;   Laterality: N/A;  . ESOPHAGOGASTRODUODENOSCOPY N/A 06/07/2015   Procedure: ESOPHAGOGASTRODUODENOSCOPY (EGD);  Surgeon: Rogene Houston, MD;  [facility];  Service: Endoscopy;  Laterality: N/A;  2:45 - moved to 1:00 - Ann to notify pt  . ESOPHAGOGASTRODUODENOSCOPY (EGD) WITH PROPOFOL N/A 12/31/2012   Procedure: ESOPHAGOGASTRODUODENOSCOPY (EGD) WITH PROPOFOL;  Surgeon: Rogene Houston, MD;  Location: [facility];  Service: Endoscopy;  Laterality: N/A;  GE junction 37,  . ESOPHAGOGASTRODUODENOSCOPY (EGD) WITH PROPOFOL N/A 07/01/2013   Procedure: ESOPHAGOGASTRODUODENOSCOPY (EGD) WITH PROPOFOL;  Surgeon: Rogene Houston, MD;  Location: [facility];  Service: Endoscopy;  Laterality: N/A;  950  . MALONEY DILATION N/A 12/31/2012   Procedure: MALONEY DILATION;  Surgeon: Rogene Houston, MD;  Location: [facility];  Service: Endoscopy;  Laterality: N/A;  used a # 54,#56  . MALONEY DILATION N/A 07/01/2013   Procedure: Venia Minks DILATION;  Surgeon: Rogene Houston, MD;  Location: [facility];  Service: Endoscopy;  Laterality: N/A;  54/58; no heme present   Past Medical History:  Diagnosis Date  . Anxiety   . Anxiety and depression   . Arthritis   . CAD (coronary artery disease)    Stent placement circumflex coronary 2007, catheterization 2008 patent stents. Normal LV function  . Chest pain   . CHF (congestive heart failure) ([place])   . COPD (chronic obstructive pulmonary disease) ([place])   . Depression   . Diabetes mellitus    Insulin dependent  . Diabetic polyneuropathy ([facility])     severe on multiple medications  . Dyslipidemia   . GERD (gastroesophageal reflux disease)   . Headache(784.0)   . 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 Hypertension   . MI (myocardial infarction) ([place])    2007  . Obstructive sleep apnea    CPAP machine at night  . Palpitations   . Tachycardia    There were no vitals taken for this visit.  Opioid Risk Score:  1 Fall Risk Score:  `1  Depression screen PHQ 2/9  Depression screen [facility] 2/9  01/14/2017 12/24/2016 11/11/2016 10/28/2016 08/07/2016 05/14/2016 10/11/2015  Decreased Interest 3 0 0 2 0 0 0  Down, Depressed, Hopeless 3 0 0 - 0 0 0  PHQ - 2 Score 6 0 0 2 0 0 0  Altered sleeping - - - - - - -  Tired, decreased energy - - - - - - -  Change in appetite - - - - - - -  Feeling bad or failure about yourself  - - - - - - -  Trouble concentrating - - - - - - -  Moving slowly or fidgety/restless - - - - - - -  Suicidal thoughts - - - - - - -  PHQ-9 Score - - - - - - -  Some recent data might be hidden   Review of Systems  Constitutional: Negative.   HENT: Negative.  Eyes: Negative.   Respiratory: Positive for apnea, shortness of breath and wheezing.   Gastrointestinal: Positive for abdominal pain, constipation, diarrhea and nausea.  Endocrine: Negative.   Genitourinary:       Bladder control  Musculoskeletal:       Spasms  Allergic/Immunologic: Negative.   Neurological: Positive for dizziness and numbness.       Tingling  Hematological: Negative.   Psychiatric/Behavioral: Positive for dysphoric mood. The patient is nervous/anxious.   All other systems reviewed and are negative.      Objective:   Physical Exam  Constitutional: She is oriented to person, place, and time. She appears well-developed and well-nourished.  HENT:  Head: Normocephalic and atraumatic.  Neck: Normal range of motion. Neck supple.  Cardiovascular: Normal rate and regular rhythm.   Pulmonary/Chest: Effort normal and breath sounds normal.  Musculoskeletal:  Normal Muscle Bulk and Muscle Testing Reveals:  Upper Extremities: Full  ROM and Muscle Strength 5/5 Thoracic Hypersensitivity Lumbar Hypersensitivity Lower Extremities: Full  ROM and Muscle Strength 5/5 Bilateral Lower Extremities Flexion Produces Pain into Patella's Arises from Table slowly using walker for support Narrow Based Gait  Neurological: She is alert and oriented to person, place, and time.  Skin: Skin is warm and dry.   Psychiatric: She has a normal mood and affect.  Nursing note and vitals reviewed.         Assessment & Plan:  1.Lumbar pain lumbar spondylosis: Encouraged to increase Activity as tolerated. 01/14/2017 Refilled: MS Contin 30 MG one tablet every 8 hours #90. We will continue the opioid monitoring program, this consists of regular clinic visits, examinations, urine drug screen, pill counts as well as use of [place] Controlled Substance Reporting System. 2. Lumbar Radiculitis: Continue Gabapentin. 01/14/2017 3. Bilateral Knee Pain/ Degenerative: Continue Voltaren Gel. Continue to Monitor. 01/14/2017. 4. Severe diabetic poly neuropathy: Continue Gabapentin. 01/14/2017 5. Insomnia: ContinueTrazodone PCP Following. 01/14/2017 6. Anxiety/ Depression: Continue Cymbalta and Klonopin . 01/14/2017  20 minutes of face to face patient care time was spent during this visit. All questions were encouraged and answered.  F/U in 1 month

## 2017-01-21 ENCOUNTER — Other Ambulatory Visit: Payer: Self-pay | Admitting: Registered Nurse

## 2017-01-21 ENCOUNTER — Other Ambulatory Visit (INDEPENDENT_AMBULATORY_CARE_PROVIDER_SITE_OTHER): Payer: Self-pay | Admitting: Internal Medicine

## 2017-01-28 ENCOUNTER — Telehealth: Payer: Self-pay | Admitting: *Deleted

## 2017-01-28 NOTE — Telephone Encounter (Signed)
Urine drug screen for this encounter is consistent for prescribed medication. There is question about the ssri medication as it says she is taking celexa and wellbutrin still and not her cymbalta, even though we are prescribing it. I spoke with pharmacy and they say she is filling both cymbalta and celexa.

## 2017-01-31 ENCOUNTER — Other Ambulatory Visit: Payer: Self-pay | Admitting: "Endocrinology

## 2017-02-02 ENCOUNTER — Ambulatory Visit: Payer: Medicaid Other | Admitting: Registered Nurse

## 2017-02-09 ENCOUNTER — Other Ambulatory Visit: Payer: Self-pay | Admitting: "Endocrinology

## 2017-02-10 ENCOUNTER — Other Ambulatory Visit: Payer: Self-pay | Admitting: "Endocrinology

## 2017-02-10 LAB — RENAL FUNCTION PANEL
Albumin: 3.2 g/dL — ABNORMAL LOW (ref 3.6–5.1)
BUN: 13 mg/dL (ref 7–25)
CO2: 28 mmol/L (ref 20–32)
Calcium: 8.7 mg/dL (ref 8.6–10.4)
Chloride: 106 mmol/L (ref 98–110)
Creat: 0.65 mg/dL (ref 0.50–0.99)
Glucose, Bld: 142 mg/dL — ABNORMAL HIGH (ref 65–99)
Phosphorus: 3.8 mg/dL (ref 2.5–4.5)
Potassium: 4.5 mmol/L (ref 3.5–5.3)
Sodium: 139 mmol/L (ref 135–146)

## 2017-02-10 LAB — HEMOGLOBIN A1C
Hgb A1c MFr Bld: 7.8 % of total Hgb — ABNORMAL HIGH (ref <5.7)
Mean Plasma Glucose: 177 (calc)
eAG (mmol/L): 9.8 (calc)

## 2017-02-10 LAB — T4, FREE: Free T4: 0.9 ng/dL (ref 0.8–1.8)

## 2017-02-10 LAB — MICROALBUMIN / CREATININE URINE RATIO
Creatinine, Urine: 76 mg/dL (ref 20–275)
Microalb, Ur: <0.2 mg/dL

## 2017-02-10 LAB — TSH: TSH: 2.9 mIU/L (ref 0.40–4.50)

## 2017-02-17 ENCOUNTER — Encounter: Payer: Self-pay | Admitting: Registered Nurse

## 2017-02-17 ENCOUNTER — Other Ambulatory Visit: Payer: Self-pay

## 2017-02-17 ENCOUNTER — Observation Stay (HOSPITAL_COMMUNITY)
Admission: EM | Admit: 2017-02-17 | Discharge: 2017-02-18 | Disposition: A | Discharge status: Home / Self Care | Payer: Medicaid Other | Attending: Family Medicine | Admitting: Family Medicine

## 2017-02-17 ENCOUNTER — Encounter: Payer: Self-pay | Admitting: "Endocrinology

## 2017-02-17 ENCOUNTER — Emergency Department (HOSPITAL_COMMUNITY): Payer: Medicaid Other

## 2017-02-17 ENCOUNTER — Ambulatory Visit (INDEPENDENT_AMBULATORY_CARE_PROVIDER_SITE_OTHER): Payer: Medicaid Other | Admitting: "Endocrinology

## 2017-02-17 ENCOUNTER — Encounter (HOSPITAL_COMMUNITY): Payer: Self-pay | Admitting: *Deleted

## 2017-02-17 VITALS — BP 168/80 | HR 66 | Wt 232.0 lb

## 2017-02-17 DIAGNOSIS — I110 Hypertensive heart disease with heart failure: Secondary | ICD-10-CM | POA: Insufficient documentation

## 2017-02-17 DIAGNOSIS — E782 Mixed hyperlipidemia: Secondary | ICD-10-CM

## 2017-02-17 DIAGNOSIS — E1159 Type 2 diabetes mellitus with other circulatory complications: Secondary | ICD-10-CM

## 2017-02-17 DIAGNOSIS — G4733 Obstructive sleep apnea (adult) (pediatric): Secondary | ICD-10-CM | POA: Diagnosis not present

## 2017-02-17 DIAGNOSIS — J449 Chronic obstructive pulmonary disease, unspecified: Secondary | ICD-10-CM | POA: Diagnosis not present

## 2017-02-17 DIAGNOSIS — I5032 Chronic diastolic (congestive) heart failure: Secondary | ICD-10-CM | POA: Diagnosis not present

## 2017-02-17 DIAGNOSIS — F329 Major depressive disorder, single episode, unspecified: Secondary | ICD-10-CM | POA: Diagnosis not present

## 2017-02-17 DIAGNOSIS — I10 Essential (primary) hypertension: Secondary | ICD-10-CM

## 2017-02-17 DIAGNOSIS — E1142 Type 2 diabetes mellitus with diabetic polyneuropathy: Secondary | ICD-10-CM | POA: Diagnosis not present

## 2017-02-17 DIAGNOSIS — I2510 Atherosclerotic heart disease of native coronary artery without angina pectoris: Secondary | ICD-10-CM | POA: Insufficient documentation

## 2017-02-17 DIAGNOSIS — Z794 Long term (current) use of insulin: Secondary | ICD-10-CM | POA: Insufficient documentation

## 2017-02-17 DIAGNOSIS — F32A Depression, unspecified: Secondary | ICD-10-CM | POA: Diagnosis present

## 2017-02-17 DIAGNOSIS — R0789 Other chest pain: Secondary | ICD-10-CM | POA: Diagnosis present

## 2017-02-17 DIAGNOSIS — F419 Anxiety disorder, unspecified: Secondary | ICD-10-CM

## 2017-02-17 DIAGNOSIS — R079 Chest pain, unspecified: Secondary | ICD-10-CM | POA: Diagnosis not present

## 2017-02-17 DIAGNOSIS — Z79899 Other long term (current) drug therapy: Secondary | ICD-10-CM | POA: Insufficient documentation

## 2017-02-17 DIAGNOSIS — Z7982 Long term (current) use of aspirin: Secondary | ICD-10-CM | POA: Insufficient documentation

## 2017-02-17 LAB — CBC WITH DIFFERENTIAL/PLATELET
Basophils Absolute: 0 10*3/uL (ref 0.0–0.1)
Basophils Relative: 0 %
Eosinophils Absolute: 0.1 10*3/uL (ref 0.0–0.7)
Eosinophils Relative: 1 %
HCT: 43.7 % (ref 36.0–46.0)
Hemoglobin: 13.5 g/dL (ref 12.0–15.0)
Lymphocytes Relative: 35 %
Lymphs Abs: 2.8 10*3/uL (ref 0.7–4.0)
MCH: 27.7 pg (ref 26.0–34.0)
MCHC: 30.9 g/dL (ref 30.0–36.0)
MCV: 89.7 fL (ref 78.0–100.0)
Monocytes Absolute: 0.5 10*3/uL (ref 0.1–1.0)
Monocytes Relative: 7 %
Neutro Abs: 4.4 10*3/uL (ref 1.7–7.7)
Neutrophils Relative %: 57 %
Platelets: 359 10*3/uL (ref 150–400)
RBC: 4.87 MIL/uL (ref 3.87–5.11)
RDW: 13.5 % (ref 11.5–15.5)
WBC: 7.8 10*3/uL (ref 4.0–10.5)

## 2017-02-17 LAB — BASIC METABOLIC PANEL
Anion gap: 7 (ref 5–15)
BUN: 14 mg/dL (ref 6–20)
CO2: 24 mmol/L (ref 22–32)
Calcium: 9.3 mg/dL (ref 8.9–10.3)
Chloride: 106 mmol/L (ref 101–111)
Creatinine, Ser: 0.69 mg/dL (ref 0.44–1.00)
GFR calc Af Amer: >60 mL/min (ref >60)
GFR calc non Af Amer: >60 mL/min (ref >60)
Glucose, Bld: 207 mg/dL — ABNORMAL HIGH (ref 65–99)
Potassium: 3.9 mmol/L (ref 3.5–5.1)
Sodium: 137 mmol/L (ref 135–145)

## 2017-02-17 LAB — GLUCOSE, CAPILLARY: Glucose-Capillary: 145 mg/dL — ABNORMAL HIGH (ref 65–99)

## 2017-02-17 LAB — BRAIN NATRIURETIC PEPTIDE: B Natriuretic Peptide: 40 pg/mL (ref 0.0–100.0)

## 2017-02-17 LAB — TROPONIN I
Troponin I: <0.03 ng/mL (ref <0.03)
Troponin I: <0.03 ng/mL (ref <0.03)
Troponin I: <0.03 ng/mL (ref <0.03)

## 2017-02-17 MED ORDER — ONDANSETRON HCL 4 MG/2ML IJ SOLN
4.0000 mg | Freq: Four times a day (QID) | INTRAMUSCULAR | Status: DC | PRN
  Administered 2017-02-17: 4 mg via INTRAVENOUS
  Filled 2017-02-17: qty 2

## 2017-02-17 MED ORDER — ASPIRIN EC 81 MG PO TBEC
81.0000 mg | DELAYED_RELEASE_TABLET | Freq: Every day | ORAL | Status: DC
  Administered 2017-02-18: 81 mg via ORAL
  Filled 2017-02-17: qty 1

## 2017-02-17 MED ORDER — GABAPENTIN 400 MG PO CAPS
400.0000 mg | ORAL_CAPSULE | Freq: Three times a day (TID) | ORAL | Status: DC
  Administered 2017-02-17 – 2017-02-18 (×3): 400 mg via ORAL
  Filled 2017-02-17 (×3): qty 1

## 2017-02-17 MED ORDER — ENOXAPARIN SODIUM 40 MG/0.4ML ~~LOC~~ SOLN
40.0000 mg | SUBCUTANEOUS | Status: DC
  Administered 2017-02-17: 40 mg via SUBCUTANEOUS
  Filled 2017-02-17: qty 0.4

## 2017-02-17 MED ORDER — METHOCARBAMOL 500 MG PO TABS
500.0000 mg | ORAL_TABLET | Freq: Three times a day (TID) | ORAL | Status: DC
  Administered 2017-02-17 – 2017-02-18 (×3): 500 mg via ORAL
  Filled 2017-02-17 (×3): qty 1

## 2017-02-17 MED ORDER — NITROGLYCERIN 0.4 MG SL SUBL
0.4000 mg | SUBLINGUAL_TABLET | SUBLINGUAL | Status: DC | PRN
  Administered 2017-02-17 (×2): 0.4 mg via SUBLINGUAL
  Filled 2017-02-17: qty 1

## 2017-02-17 MED ORDER — ATORVASTATIN CALCIUM 40 MG PO TABS
40.0000 mg | ORAL_TABLET | Freq: Every day | ORAL | Status: DC
  Administered 2017-02-18: 40 mg via ORAL
  Filled 2017-02-17: qty 1

## 2017-02-17 MED ORDER — ALBUTEROL SULFATE HFA 108 (90 BASE) MCG/ACT IN AERS: 2.0000 | INHALATION_SPRAY | RESPIRATORY_TRACT | Status: DC | PRN

## 2017-02-17 MED ORDER — FAMOTIDINE 20 MG PO TABS
40.0000 mg | ORAL_TABLET | Freq: Two times a day (BID) | ORAL | Status: DC
  Administered 2017-02-17 – 2017-02-18 (×2): 40 mg via ORAL
  Filled 2017-02-17 (×2): qty 2

## 2017-02-17 MED ORDER — PANTOPRAZOLE SODIUM 40 MG PO TBEC
40.0000 mg | DELAYED_RELEASE_TABLET | Freq: Two times a day (BID) | ORAL | Status: DC
  Administered 2017-02-17 – 2017-02-18 (×2): 40 mg via ORAL
  Filled 2017-02-17 (×2): qty 1

## 2017-02-17 MED ORDER — ALBUTEROL SULFATE (2.5 MG/3ML) 0.083% IN NEBU: 2.5000 mg | INHALATION_SOLUTION | RESPIRATORY_TRACT | Status: DC | PRN

## 2017-02-17 MED ORDER — INSULIN GLARGINE 100 UNIT/ML ~~LOC~~ SOLN
50.0000 [IU] | Freq: Every day | SUBCUTANEOUS | Status: DC
  Administered 2017-02-17: 50 [IU] via SUBCUTANEOUS
  Filled 2017-02-17 (×2): qty 0.5

## 2017-02-17 MED ORDER — PSYLLIUM 95 % PO PACK
1.0000 | PACK | Freq: Every day | ORAL | Status: DC
  Administered 2017-02-17: 1 via ORAL
  Filled 2017-02-17 (×3): qty 1

## 2017-02-17 MED ORDER — GI COCKTAIL ~~LOC~~: 30.0000 mL | Freq: Four times a day (QID) | ORAL | Status: DC | PRN

## 2017-02-17 MED ORDER — INSULIN ASPART 100 UNIT/ML ~~LOC~~ SOLN
0.0000 [IU] | Freq: Three times a day (TID) | SUBCUTANEOUS | Status: DC
  Administered 2017-02-18: 3 [IU] via SUBCUTANEOUS

## 2017-02-17 MED ORDER — POTASSIUM CHLORIDE CRYS ER 10 MEQ PO TBCR
10.0000 meq | EXTENDED_RELEASE_TABLET | Freq: Two times a day (BID) | ORAL | Status: DC
  Administered 2017-02-17 – 2017-02-18 (×2): 10 meq via ORAL
  Filled 2017-02-17 (×2): qty 1

## 2017-02-17 MED ORDER — CITALOPRAM HYDROBROMIDE 20 MG PO TABS
40.0000 mg | ORAL_TABLET | Freq: Every day | ORAL | Status: DC
  Administered 2017-02-18: 40 mg via ORAL
  Filled 2017-02-17: qty 2

## 2017-02-17 MED ORDER — METOPROLOL SUCCINATE ER 50 MG PO TB24
75.0000 mg | ORAL_TABLET | Freq: Every day | ORAL | Status: DC
  Administered 2017-02-18: 75 mg via ORAL
  Filled 2017-02-17: qty 1

## 2017-02-17 MED ORDER — ASPIRIN 81 MG PO CHEW
243.0000 mg | CHEWABLE_TABLET | Freq: Once | ORAL | Status: AC
  Administered 2017-02-17: 243 mg via ORAL
  Filled 2017-02-17: qty 3

## 2017-02-17 MED ORDER — MORPHINE SULFATE (PF) 4 MG/ML IV SOLN
4.0000 mg | INTRAVENOUS | Status: DC | PRN
Start: 2017-02-17 — End: 2017-02-17
  Administered 2017-02-17: 4 mg via INTRAVENOUS
  Filled 2017-02-17: qty 1

## 2017-02-17 MED ORDER — BECLOMETHASONE DIPROPIONATE 40 MCG/ACT IN AERS: 1.0000 | INHALATION_SPRAY | Freq: Two times a day (BID) | RESPIRATORY_TRACT | Status: DC

## 2017-02-17 MED ORDER — INSULIN ASPART 100 UNIT/ML ~~LOC~~ SOLN
0.0000 [IU] | Freq: Every day | SUBCUTANEOUS | Status: DC
Start: 2017-02-17 — End: 2017-02-18

## 2017-02-17 MED ORDER — MORPHINE SULFATE (PF) 2 MG/ML IV SOLN
2.0000 mg | INTRAVENOUS | Status: DC | PRN
  Administered 2017-02-17: 2 mg via INTRAVENOUS
  Filled 2017-02-17: qty 1

## 2017-02-17 MED ORDER — CLONAZEPAM 0.5 MG PO TABS
0.5000 mg | ORAL_TABLET | Freq: Two times a day (BID) | ORAL | Status: DC
  Administered 2017-02-17 – 2017-02-18 (×2): 0.5 mg via ORAL
  Filled 2017-02-17 (×2): qty 1

## 2017-02-17 MED ORDER — MOMETASONE FURO-FORMOTEROL FUM 200-5 MCG/ACT IN AERO
2.0000 | INHALATION_SPRAY | Freq: Two times a day (BID) | RESPIRATORY_TRACT | Status: DC
  Administered 2017-02-17 – 2017-02-18 (×2): 2 via RESPIRATORY_TRACT
  Filled 2017-02-17: qty 8.8

## 2017-02-17 MED ORDER — MORPHINE SULFATE ER 30 MG PO TBCR
30.0000 mg | EXTENDED_RELEASE_TABLET | Freq: Three times a day (TID) | ORAL | Status: DC
  Administered 2017-02-17 – 2017-02-18 (×2): 30 mg via ORAL
  Filled 2017-02-17 (×2): qty 1

## 2017-02-17 MED ORDER — TOPIRAMATE 100 MG PO TABS
200.0000 mg | ORAL_TABLET | Freq: Every day | ORAL | Status: DC
  Administered 2017-02-17: 200 mg via ORAL
  Filled 2017-02-17: qty 2

## 2017-02-17 MED ORDER — MONTELUKAST SODIUM 10 MG PO TABS
10.0000 mg | ORAL_TABLET | Freq: Every day | ORAL | Status: DC
Start: 2017-02-18 — End: 2017-02-18
  Administered 2017-02-18: 10 mg via ORAL
  Filled 2017-02-17: qty 1

## 2017-02-17 MED ORDER — BUPROPION HCL ER (SR) 100 MG PO TB12
100.0000 mg | ORAL_TABLET | Freq: Every day | ORAL | Status: DC
  Administered 2017-02-18: 100 mg via ORAL
  Filled 2017-02-17 (×3): qty 1

## 2017-02-17 MED ORDER — ACETAMINOPHEN 325 MG PO TABS: 650.0000 mg | ORAL_TABLET | ORAL | Status: DC | PRN

## 2017-02-17 MED ORDER — TRAZODONE HCL 50 MG PO TABS
50.0000 mg | ORAL_TABLET | Freq: Every day | ORAL | Status: DC
  Administered 2017-02-17: 50 mg via ORAL
  Filled 2017-02-17: qty 1

## 2017-02-17 MED ORDER — DULOXETINE HCL 30 MG PO CPEP
30.0000 mg | ORAL_CAPSULE | Freq: Every day | ORAL | Status: DC
  Administered 2017-02-18: 30 mg via ORAL
  Filled 2017-02-17: qty 1

## 2017-02-17 NOTE — ED Provider Notes (Signed)
[facility] EMERGENCY DEPARTMENT Provider Note   CSN: [id number] Arrival date & time: 02/17/17  1210     History   Chief Complaint Chief Complaint  Patient presents with  . Chest Pain    HPI [name] is a 66 y.o. female.  HPI  Pt was seen at 1400. Per pt, c/o gradual onset and persistence of multiple intermittent episodes of left sided chest "pain" for the past several days. Describes the CP as "heaviness," "pressure."  Has been associated with nausea, diaphoresis and SOB. Pt states her symptoms last for approximately 1 hour before resolving, usually after she takes her own SL ntg. Symptoms come on at rest or on exertion. Denies palpitations, no cough, no abd pain, no vomiting/diarrhea, no back pain, no fevers, no rash.   Past Medical History:  Diagnosis Date  . Anxiety   . Anxiety and depression   . Arthritis   . CAD (coronary artery disease)    Stent placement circumflex coronary 2007, catheterization 2008 patent stents. Normal LV function  . Chest pain   . CHF (congestive heart failure) ([place])   . COPD (chronic obstructive pulmonary disease) ([place])   . Depression   . Diabetes mellitus    Insulin dependent  . Diabetic polyneuropathy (HCC)     severe on multiple medications  . Dyslipidemia   . GERD (gastroesophageal reflux disease)   . Headache(784.0)   . 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 Hypertension   . MI (myocardial infarction) ([place])    2007  . Obstructive sleep apnea    CPAP machine at night  . Palpitations   . Tachycardia     Patient Active Problem List   Diagnosis Date Noted  . Essential hypertension, benign 05/14/2015  . Morbid obesity due to excess calories ([place]) 05/02/2015  . Dysphagia 04/24/2015  . Arthrosis of right acromioclavicular joint 11/13/2014  . Spondylosis of lumbar region without myelopathy or radiculopathy 11/13/2014  . Painful diabetic neuropathy ([place]) 06/26/2014  . Diarrhea 03/02/2013  . Rectal irritation 03/02/2013  . Chronic low back  pain without sciatica 07/29/2012  . Muscle weakness (generalized) 07/29/2012  . Spinal stenosis, lumbar region, without neurogenic claudication 07/19/2012  . Irritable bowel syndrome 08/24/2011  . Pancreatitis 10/17/2010  . CAD (coronary artery disease)   . Type 2 diabetes mellitus with vascular disease ([place])   . Diabetic polyneuropathy ([place])   . Anxiety and depression   . Mixed hyperlipidemia 09/20/2009  . Chronic diastolic heart failure ([place]) 09/20/2009  . OBSTRUCTIVE SLEEP APNEA 09/04/2009  . TACHYCARDIA 11/08/2008  . PALPITATIONS 11/08/2008  . CHEST PAIN-UNSPECIFIED 11/08/2008    Past Surgical History:  Procedure Laterality Date  . ABDOMINAL HYSTERECTOMY    . Arm Surgery Bilateral    due to fall-pt broke both forearms  . BIOPSY N/A 07/01/2013   Procedure: BIOPSY;  Surgeon: Rogene Houston, MD;  Location: [facility] ORS;  Service: Endoscopy;  Laterality: N/A;  . CHOLECYSTECTOMY    . COLONOSCOPY  08/06/2011   Procedure: COLONOSCOPY;  Surgeon: Rogene Houston, MD;  Location: [facility] ENDO SUITE;  Service: Endoscopy;  Laterality: N/A;  215  . COLONOSCOPY WITH PROPOFOL N/A 12/31/2012   Procedure: COLONOSCOPY WITH PROPOFOL;  Surgeon: Rogene Houston, MD;  Location: [facility] ORS;  Service: Endoscopy;  Laterality: N/A;  in cecum at 0814, total withdrawal time 29mn  . CORONARY ANGIOPLASTY WITH STENT PLACEMENT    . ESOPHAGEAL DILATION N/A 06/07/2015   Procedure: ESOPHAGEAL DILATION;  Surgeon: NRogene Houston MD;  Location:  [facility];  Service: Endoscopy;  Laterality: N/A;  . ESOPHAGOGASTRODUODENOSCOPY N/A 06/07/2015   Procedure: ESOPHAGOGASTRODUODENOSCOPY (EGD);  Surgeon: Rogene Houston, MD;  Location: [facility];  Service: Endoscopy;  Laterality: N/A;  2:45 - moved to 1:00 - Ann to notify pt  . ESOPHAGOGASTRODUODENOSCOPY (EGD) WITH PROPOFOL N/A 12/31/2012   Procedure: ESOPHAGOGASTRODUODENOSCOPY (EGD) WITH PROPOFOL;  Surgeon: Rogene Houston, MD;  Location: [facility];  Service: Endoscopy;  Laterality:  N/A;  GE junction 37,  . ESOPHAGOGASTRODUODENOSCOPY (EGD) WITH PROPOFOL N/A 07/01/2013   Procedure: ESOPHAGOGASTRODUODENOSCOPY (EGD) WITH PROPOFOL;  Surgeon: Rogene Houston, MD;  Location: [facility];  Service: Endoscopy;  Laterality: N/A;  950  . MALONEY DILATION N/A 12/31/2012   Procedure: MALONEY DILATION;  Surgeon: Rogene Houston, MD;  Location: [facility];  Service: Endoscopy;  Laterality: N/A;  used a # 54,#56  . MALONEY DILATION N/A 07/01/2013   Procedure: Venia Minks DILATION;  Surgeon: Rogene Houston, MD;  Location: [facility];  Service: Endoscopy;  Laterality: N/A;  54/58; no heme present    OB History    No data available       Home Medications    Prior to Admission medications   Medication Sig Start Date End Date Taking? Authorizing Provider  ACCU-CHEK AVIVA PLUS test strip USE TO CHECK BLOOD SUGAR THREE TIMES DAILY - DX: E11.9 06/03/16   Cassandria Anger, MD  aspirin EC 81 MG tablet Take 81 mg by mouth daily.    [provider]  atorvastatin (LIPITOR) 40 MG tablet Take 40 mg by mouth daily.    [provider]  beclomethasone (QVAR) 40 MCG/ACT inhaler Inhale 1 puff into the lungs 2 (two) times daily.    [provider]  buPROPion (WELLBUTRIN SR) 100 MG 12 hr tablet Take 100 mg by mouth daily.    [provider]  citalopram (CELEXA) 40 MG tablet TAKE ONE TABLET BY MOUTH EVERY EVENING (CORRECTED DOSE) 11/24/16   [provider]  clonazePAM (KLONOPIN) 0.5 MG tablet Take 1 tablet (0.5 mg total) by mouth 2 (two) times daily as needed for anxiety. 01/14/17   Bayard Hugger, NP  diclofenac sodium (VOLTAREN) 1 % GEL APPLY TO BOTH KNEES THREE TIMES DAILY 01/21/17   Bayard Hugger, NP  DULoxetine (CYMBALTA) 30 MG capsule Take 1 capsule (30 mg total) by mouth daily. 12/24/16   Bayard Hugger, NP  DUREZOL 0.05 % EMUL INSTILL ONE DROP INTO THE RIGHT EYE THREE TIMES DAILY, TAPER OVER THREE WEEKS 10/15/16   [provider]  estrogens, conjugated,  (PREMARIN) 1.25 MG tablet Take 1.25 mg by mouth daily.     [provider]  fluticasone-salmeterol (ADVAIR HFA) 115-21 MCG/ACT inhaler Inhale 2 puffs into the lungs 2 (two) times daily.    [provider]  gabapentin (NEURONTIN) 400 MG capsule Take 400 mg by mouth 3 (three) times daily. 11/24/16   [provider]  GLOBAL EASE INJECT PEN NEEDLES 31G X 8 MM MISC USE AS DIRECTED FOUR TIMES DAILY 02/10/17   Cassandria Anger, MD  ibuprofen (ADVIL,MOTRIN) 200 MG tablet Take 200 mg by mouth daily as needed for headache.    [provider]  LANTUS SOLOSTAR 100 UNIT/ML Solostar Pen INJECT 50 UNITS INTO THE SKIN AT BEDTIME. 02/02/17   Nida, Marella Chimes, MD  lidocaine (XYLOCAINE) 5 % ointment Apply 1 application topically at bedtime.  07/17/11   [provider]  meloxicam (MOBIC) 7.5 MG tablet Take 7.5 mg by  mouth 2 (two) times daily. 12/16/16   [provider]  metFORMIN (GLUCOPHAGE) 500 MG tablet TAKE ONE TABLET BY MOUTH TWICE DAILY with a meal 02/11/17   Nida, Marella Chimes, MD  methocarbamol (ROBAXIN) 500 MG tablet Take 1 tablet (500 mg total) by mouth 3 (three) times daily. 06/30/16   Kirsteins, Luanna Salk, MD  metoprolol succinate (TOPROL-XL) 50 MG 24 hr tablet TAKE 1 AND 1/2 TABLETS DAILY 75MG 08/20/16   Lendon Colonel, NP  montelukast (SINGULAIR) 10 MG tablet Take 10 mg by mouth daily. 11/24/16   [provider]  morphine (MS CONTIN) 30 MG 12 hr tablet Take 1 tablet (30 mg total) by mouth every 8 (eight) hours. 01/14/17   Bayard Hugger, NP  nitroGLYCERIN (NITROSTAT) 0.4 MG SL tablet Place 1 tablet (0.4 mg total) under the tongue every 5 (five) minutes x 3 doses as needed. For chest pains 10/11/13   Herminio Commons, MD  ondansetron (ZOFRAN) 4 MG tablet Take 4 mg by mouth every 6 (six) hours as needed for nausea or vomiting.    [provider]  pantoprazole (PROTONIX) 40 MG tablet TAKE ONE TABLET BY MOUTH TWICE DAILY  BEFORE MEALS 01/21/17   Setzer, Terri L, NP  potassium chloride (K-DUR,KLOR-CON) 10 MEQ tablet Take 10 mEq by mouth 2 (two) times daily. 11/24/16   [provider]  PROAIR HFA 108 (90 Base) MCG/ACT inhaler inhale TWO puffs FOUR TIMES DAILY AS NEEDED 11/24/16   [provider]  promethazine (PHENERGAN) 25 MG tablet Take 25 mg by mouth 4 (four) times daily as needed. 12/07/16   [provider]  psyllium (METAMUCIL SMOOTH TEXTURE) 28 % packet Take 1 packet by mouth at bedtime. 06/14/13   Rogene Houston, MD  ranitidine (ZANTAC) 300 MG tablet Take 300 mg by mouth 2 (two) times daily. 11/24/16   [provider]  topiramate (TOPAMAX) 200 MG tablet Take 200 mg by mouth at bedtime. 11/24/16   [provider]  traZODone (DESYREL) 50 MG tablet Take 50 mg by mouth at bedtime. 11/24/16   [provider]    Family History Family History  Problem Relation Age of Onset  . Heart attack Mother   . Colon cancer Mother   . Heart attack Father   . Stroke Sister   . Hemophilia Child   . Cancer Unknown   . Coronary artery disease Unknown   . Cancer Brother   . Cancer Maternal Aunt   . Cancer Maternal Uncle   . Cancer Paternal Aunt   . Cancer Paternal Uncle   . Cancer Brother     Social History Social History   Tobacco Use  . Smoking status: Never Smoker  . Smokeless tobacco: Never Used  Substance Use Topics  . Alcohol use: No    Alcohol/week: 0.0 oz  . Drug use: No     Allergies   Nitrofuran derivatives; Amphetamine-dextroamphet er; Pregabalin; Topiramate; and Verelan [verapamil]   Review of Systems Review of Systems ROS: Statement: All systems negative except as marked or noted in the HPI; Constitutional: Negative for fever and chills. ; ; Eyes: Negative for eye pain, redness and discharge. ; ; ENMT: Negative for ear pain, hoarseness, nasal congestion, sinus pressure and sore throat. ; ; Cardiovascular: +CP, SOB, diaphoresis. Negative for  palpitations, and peripheral edema. ; ; Respiratory: Negative for cough, wheezing and stridor. ; ; Gastrointestinal: +nausea. Negative for vomiting, diarrhea, abdominal pain, blood in stool, hematemesis, jaundice and rectal bleeding. . ; ;  Genitourinary: Negative for dysuria, flank pain and hematuria. ; ; Musculoskeletal: Negative for back pain and neck pain. Negative for swelling and trauma.; ; Skin: Negative for pruritus, rash, abrasions, blisters, bruising and skin lesion.; ; Neuro: Negative for headache, lightheadedness and neck stiffness. Negative for weakness, altered level of consciousness, altered mental status, extremity weakness, paresthesias, involuntary movement, seizure and syncope.       Physical Exam Updated Vital Signs BP 138/87 (BP Location: Right Arm)   Pulse (!) 59   Temp 98.1 F (36.7 C) (Oral)   Resp (!) 21   SpO2 97%   Physical Exam 1405: Physical examination:  Nursing notes reviewed; Vital signs and O2 SAT reviewed;  Constitutional: Well developed, Well nourished, Well hydrated, In no acute distress; Head:  Normocephalic, atraumatic; Eyes: EOMI, PERRL, No scleral icterus; ENMT: Mouth and pharynx normal, Mucous membranes moist; Neck: Supple, Full range of motion, No lymphadenopathy; Cardiovascular: Regular rate and rhythm, No gallop; Respiratory: Breath sounds clear & equal bilaterally, No wheezes.  Speaking full sentences with ease, Normal respiratory effort/excursion; Chest: Nontender, Movement normal; Abdomen: Soft, Nontender, Nondistended, Normal bowel sounds; Genitourinary: No CVA tenderness; Extremities: Pulses normal, No tenderness, No edema, No calf edema or asymmetry.; Neuro: AA&Ox3, Major CN grossly intact.  Speech clear. No gross focal motor or sensory deficits in extremities.; Skin: Color normal, Warm, Dry.   ED Treatments / Results  Labs (all labs ordered are listed, but only abnormal results are displayed)   EKG  EKG Interpretation  Date/Time:  Tuesday  February 17 2017 12:19:20 EST Ventricular Rate:  64 PR Interval:    QRS Duration: 96 QT Interval:  425 QTC Calculation: 439 R Axis:   24 Text Interpretation:  Sinus rhythm Low voltage, precordial leads Baseline wander When compared with ECG of 06/30/2013 No significant change was found Confirmed by Francine Graven [phone]) on 02/17/2017 2:07:50 PM       Radiology   Procedures Procedures (including critical care time)  Medications Ordered in ED Medications  aspirin chewable tablet 243 mg (not administered)  nitroGLYCERIN (NITROSTAT) SL tablet 0.4 mg (not administered)  morphine 4 MG/ML injection 4 mg (not administered)     Initial Impression / Assessment and Plan / ED Course  I have reviewed the triage vital signs and the nursing notes.  Pertinent labs & imaging results that were available during my care of the patient were reviewed by me and considered in my medical decision making (see chart for details).  MDM Reviewed: previous chart, nursing note and vitals Reviewed previous: labs and ECG Interpretation: labs, ECG and x-ray    Results for orders placed or performed during the hospital encounter of 63/84/53  Basic metabolic panel  Result Value Ref Range   Sodium 137 135 - 145 mmol/L   Potassium 3.9 3.5 - 5.1 mmol/L   Chloride 106 101 - 111 mmol/L   CO2 24 22 - 32 mmol/L   Glucose, Bld 207 (H) 65 - 99 mg/dL   BUN 14 6 - 20 mg/dL   Creatinine, Ser 0.69 0.44 - 1.00 mg/dL   Calcium 9.3 8.9 - 10.3 mg/dL   GFR calc non Af Amer >60 >60 mL/min   GFR calc Af Amer >60 >60 mL/min   Anion gap 7 5 - 15  Troponin I  Result Value Ref Range   Troponin I <0.03 <0.03 ng/mL  CBC with Differential  Result Value Ref Range   WBC 7.8 4.0 - 10.5 K/uL   RBC 4.87 3.87 - 5.11 MIL/uL  Hemoglobin 13.5 12.0 - 15.0 g/dL   HCT 43.7 36.0 - 46.0 %   MCV 89.7 78.0 - 100.0 fL   MCH 27.7 26.0 - 34.0 pg   MCHC 30.9 30.0 - 36.0 g/dL   RDW 13.5 11.5 - 15.5 %   Platelets 359 150 - 400 K/uL     Neutrophils Relative % 57 %   Neutro Abs 4.4 1.7 - 7.7 K/uL   Lymphocytes Relative 35 %   Lymphs Abs 2.8 0.7 - 4.0 K/uL   Monocytes Relative 7 %   Monocytes Absolute 0.5 0.1 - 1.0 K/uL   Eosinophils Relative 1 %   Eosinophils Absolute 0.1 0.0 - 0.7 K/uL   Basophils Relative 0 %   Basophils Absolute 0.0 0.0 - 0.1 K/uL   Dg Chest Port 1 View Result Date: 02/17/2017 CLINICAL DATA:  Chest pain intermittently for a few days, worse today. Some shortness of breath. EXAM: PORTABLE CHEST 1 VIEW COMPARISON:  Thoracic spine radiographs 10/16/2014. Chest radiographs 02/25/2014. FINDINGS: The cardiac silhouette remains mildly enlarged. The patient has taken a shallower inspiration than on the prior chest radiographs. No confluent airspace opacity, edema, sizable pleural effusion, or pneumothorax is identified. No acute osseous abnormality is seen. IMPRESSION: Cardiomegaly without evidence of acute cardiopulmonary process. Electronically Signed   By: Logan Bores M.D.   On: 02/17/2017 14:15    1515:  ASA, SL ntg, and IV morphine ordered. T/C to [facility] Dr. Bronson Ing, case discussed, including:  HPI, pertinent PM/SHx, VS/PE, dx testing, ED course and treatment:  He has reviewed the chart, OK for pt to stay at [facility] for rule out.  1555:  T/C to [facility] Dr. Lorin Mercy, case discussed, including:  HPI, pertinent PM/SHx, VS/PE, dx testing, ED course and treatment:  Agreeable to admit.     Final Clinical Impressions(s) / ED Diagnoses   Final diagnoses:  None    ED Discharge Orders    None       Francine Graven, DO 02/20/17 [phone]

## 2017-02-17 NOTE — Progress Notes (Signed)
I asked pt if she wears CPAP at night and if she wanted me to go ahead and bring one from department for her to use and she stated "no that she would be fine. She asked if our machine were small and I said they are pretty small probably not like hers at home but I would bring one for her. Asked what size mask she wore but she insisted that she would be fine without it. I then informed her if she changes her mind she can call and I will set it up

## 2017-02-17 NOTE — H&P (Signed)
History and Physical    [name] WLN:[id number] DOB: 1950/07/15 DOA: 02/17/2017  PCP: Emily Chroman, MD Consultants:  [name] - endocrinology; [facility] - pain clinic; [place] and [place] Patient coming from:  Home - lives with friend; [name]: [name]  [phone] and daughter [phone]  Chief Complaint: Chest pain  HPI: [name] is a 66 y.o. female with medical history significant of OSA on CPAP; HTN; HLD; DM; COPD; CHF (grade 1 diastolic dysfunction in 37/85); and CAD s/p PCI presenting with CP.  For 2-3 nights she has been having left-sided chest pain and heaviness.  She took a NTG twice with relief.  This AM, she got up and took NTG about 930 because she knew she had a scheduled appt with [name].  In his office, she told him and he sent her to the ER.  She had been losing weight regularly, but she had gained 10 pounds without changing her eating habits.  +SOB, non-exertional.  No cough.  +diaphoresis.  Some LE edema.  She has been feeling very bloated.   ED Course:  Chest pain.  Dr. Bronson Ing agrees with observation here at [facility].  Negative EKG and troponin.    Review of Systems: As per HPI; otherwise review of systems reviewed and negative.   Ambulatory Status:  Ambulates with a cane or walker  Past Medical History:  Diagnosis Date  . Anxiety and depression   . Arthritis   . CAD (coronary artery disease)    Stent placement circumflex coronary 2007, catheterization 2008 patent stents. Normal LV function  . Chest pain   . CHF (congestive heart failure) ([place])   . COPD (chronic obstructive pulmonary disease) (HCC)    no home O2  . Depression   . Diabetes mellitus    Insulin dependent  . Diabetic polyneuropathy (HCC)     severe on multiple medications  . Dyslipidemia   . GERD (gastroesophageal reflux disease)   . Headache(784.0)   . 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 Hypertension   . MI (myocardial infarction) ([place])    2007  . Obstructive sleep apnea    CPAP, setting ?2   . Palpitations   . Tachycardia     Past Surgical History:  Procedure Laterality Date  . ABDOMINAL HYSTERECTOMY    . Arm Surgery Bilateral    due to fall-pt broke both forearms  . BIOPSY N/A 07/01/2013   Procedure: BIOPSY;  Surgeon: Rogene Houston, MD;  Location: [facility] OR[facility];  Service: Endoscopy;  Laterality: N/A;  . CHOLECYSTECTOMY    . COLONOSCOPY  08/06/2011   Procedure: COLONOSCOPY;  Surgeon: Rogene Houston, MD;  Location: [facility] SU[facility];  Service: Endoscopy;  Laterality: N/A;  215  . COLONOSCOPY WITH PROPOFOL N/A 12/31/2012   Procedure: COLONOSCOPY WITH PROPOFOL;  Surgeon: Rogene Houston, MD;  Location: [facility];  Service: Endoscopy;  Laterality: N/A;  in cecum at 0814, total withdrawal time 37mn  . CORONARY ANGIOPLASTY WITH STENT PLACEMENT    . ESOPHAGEAL DILATION N/A 06/07/2015   Procedure: ESOPHAGEAL DILATION;  Surgeon: NRogene Houston MD;  Location: [facility];  Service: Endoscopy;  Laterality: N/A;  . ESOPHAGOGASTRODUODENOSCOPY N/A 06/07/2015   Procedure: ESOPHAGOGASTRODUODENOSCOPY (EGD);  Surgeon: NRogene Houston MD;  Location: [facility];  Service: Endoscopy;  Laterality: N/A;  2:45 - moved to 1:00 - Ann to notify pt  . ESOPHAGOGASTRODUODENOSCOPY (EGD) WITH PROPOFOL N/A 12/31/2012   Procedure: ESOPHAGOGASTRODUODENOSCOPY (EGD) WITH PROPOFOL;  Surgeon: NRogene Houston  MD;  Location: [facility];  Service: Endoscopy;  Laterality: N/A;  GE junction 37,  . ESOPHAGOGASTRODUODENOSCOPY (EGD) WITH PROPOFOL N/A 07/01/2013   Procedure: ESOPHAGOGASTRODUODENOSCOPY (EGD) WITH PROPOFOL;  Surgeon: Rogene Houston, MD;  Location: [facility];  Service: Endoscopy;  Laterality: N/A;  950  . MALONEY DILATION N/A 12/31/2012   Procedure: MALONEY DILATION;  Surgeon: Rogene Houston, MD;  Location: [facility];  Service: Endoscopy;  Laterality: N/A;  used a # 54,#56  . MALONEY DILATION N/A 07/01/2013   Procedure: Venia Minks DILATION;  Surgeon: Rogene Houston, MD;  Location: [facility];  Service: Endoscopy;  Laterality:  N/A;  54/58; no heme present    Social History   Socioeconomic History  . Marital status: Legally Separated    Spouse name: Not on file  . Number of children: 4  . Years of education: Not on file  . Highest education level: Not on file  Social Needs  . Financial resource strain: Not on file  . Food insecurity - worry: Not on file  . Food insecurity - inability: Not on file  . Transportation needs - medical: Not on file  . Transportation needs - non-medical: Not on file  Occupational History  . Occupation: Disabled  Tobacco Use  . Smoking status: Never Smoker  . Smokeless tobacco: Never Used  Substance and Sexual Activity  . Alcohol use: No    Alcohol/week: 0.0 oz  . Drug use: No  . Sexual activity: Not on file  Other Topics Concern  . Not on file  Social History Narrative   Patient does not get regular exercise   Denies caffeine use     Allergies  Allergen Reactions  . Nitrofuran Derivatives Itching and Swelling  . Amphetamine-Dextroamphet Er Swelling  . Pregabalin Swelling  . Topiramate Other (See Comments)    Tongue tingle  . Verelan [Verapamil] Rash    Family History  Problem Relation Age of Onset  . Heart attack Mother   . Colon cancer Mother   . Heart attack Father   . Stroke Sister   . Hemophilia Child   . Cancer Unknown   . Coronary artery disease Unknown   . Cancer Brother   . Cancer Maternal Aunt   . Cancer Maternal Uncle   . Cancer Paternal Aunt   . Cancer Paternal Uncle   . Cancer Brother     Prior to Admission medications   Medication Sig Start Date End Date Taking? Authorizing Provider  aspirin EC 81 MG tablet Take 81 mg by mouth daily.   Yes [provider]  atorvastatin (LIPITOR) 40 MG tablet Take 40 mg by mouth daily.   Yes [provider]  beclomethasone (QVAR) 40 MCG/ACT inhaler Inhale 1 puff into the lungs 2 (two) times daily.   Yes [provider]  buPROPion (WELLBUTRIN SR) 100 MG 12 hr tablet Take 100 mg  by mouth daily.   Yes [provider]  citalopram (CELEXA) 40 MG tablet TAKE ONE TABLET BY MOUTH EVERY EVENING (CORRECTED DOSE) 11/24/16  Yes [provider]  clonazePAM (KLONOPIN) 0.5 MG tablet Take 1 tablet (0.5 mg total) by mouth 2 (two) times daily as needed for anxiety. Patient taking differently: Take 0.5 mg by mouth 2 (two) times daily.  01/14/17  Yes Bayard Hugger, NP  diclofenac sodium (VOLTAREN) 1 % GEL APPLY TO BOTH KNEES THREE TIMES DAILY 01/21/17  Yes Bayard Hugger, NP  DULoxetine (CYMBALTA) 30 MG capsule Take 1 capsule (30 mg total)  by mouth daily. 12/24/16  Yes Danella Sensing L, NP  DUREZOL 0.05 % EMUL INSTILL ONE DROP INTO THE RIGHT EYE THREE TIMES DAILY, TAPER OVER THREE WEEKS 10/15/16  Yes [provider]  estrogens, conjugated, (PREMARIN) 1.25 MG tablet Take 1.25 mg by mouth daily.    Yes [provider]  fluticasone-salmeterol (ADVAIR HFA) 115-21 MCG/ACT inhaler Inhale 2 puffs into the lungs 2 (two) times daily.   Yes [provider]  gabapentin (NEURONTIN) 400 MG capsule Take 400 mg by mouth 3 (three) times daily. 11/24/16  Yes [provider]  ibuprofen (ADVIL,MOTRIN) 200 MG tablet Take 200 mg by mouth daily as needed for headache.   Yes [provider]  LANTUS SOLOSTAR 100 UNIT/ML Solostar Pen INJECT 50 UNITS INTO THE SKIN AT BEDTIME. 02/02/17  Yes Nida, Marella Chimes, MD  lidocaine (XYLOCAINE) 5 % ointment Apply 1 application topically at bedtime.  07/17/11  Yes [provider]  meloxicam (MOBIC) 7.5 MG tablet Take 7.5 mg by mouth 2 (two) times daily. 12/16/16  Yes [provider]  metFORMIN (GLUCOPHAGE) 500 MG tablet TAKE ONE TABLET BY MOUTH TWICE DAILY with a meal 02/11/17  Yes Nida, Marella Chimes, MD  methocarbamol (ROBAXIN) 500 MG tablet Take 1 tablet (500 mg total) by mouth 3 (three) times daily. 06/30/16  Yes Kirsteins, Luanna Salk, MD  metoprolol succinate (TOPROL-XL) 50 MG 24 hr tablet TAKE  1 AND 1/2 TABLETS DAILY 75MG 08/20/16  Yes Lendon Colonel, NP  montelukast (SINGULAIR) 10 MG tablet Take 10 mg by mouth daily. 11/24/16  Yes [provider]  morphine (MS CONTIN) 30 MG 12 hr tablet Take 1 tablet (30 mg total) by mouth every 8 (eight) hours. 01/14/17  Yes Bayard Hugger, NP  Multiple Vitamin (MULTIVITAMIN WITH MINERALS) TABS tablet Take 1 tablet by mouth daily.   Yes [provider]  nitroGLYCERIN (NITROSTAT) 0.4 MG SL tablet Place 1 tablet (0.4 mg total) under the tongue every 5 (five) minutes x 3 doses as needed. For chest pains 10/11/13  Yes Herminio Commons, MD  ondansetron (ZOFRAN) 4 MG tablet Take 4 mg by mouth every 6 (six) hours as needed for nausea or vomiting.   Yes [provider]  pantoprazole (PROTONIX) 40 MG tablet TAKE ONE TABLET BY MOUTH TWICE DAILY BEFORE MEALS 01/21/17  Yes Setzer, Terri L, NP  potassium chloride (K-DUR,KLOR-CON) 10 MEQ tablet Take 10 mEq by mouth 2 (two) times daily. 11/24/16  Yes [provider]  PROAIR HFA 108 (90 Base) MCG/ACT inhaler inhale TWO puffs FOUR TIMES DAILY AS NEEDED 11/24/16  Yes [provider]  promethazine (PHENERGAN) 25 MG tablet Take 25 mg by mouth 4 (four) times daily as needed. 12/07/16  Yes [provider]  psyllium (METAMUCIL SMOOTH TEXTURE) 28 % packet Take 1 packet by mouth at bedtime. 06/14/13  Yes Rehman, Mechele Dawley, MD  ranitidine (ZANTAC) 300 MG tablet Take 300 mg by mouth 2 (two) times daily. 11/24/16  Yes [provider]  topiramate (TOPAMAX) 200 MG tablet Take 200 mg by mouth at bedtime. 11/24/16  Yes [provider]  traZODone (DESYREL) 50 MG tablet Take 50 mg by mouth at bedtime. 11/24/16  Yes [provider]  ACCU-CHEK AVIVA PLUS test strip USE TO CHECK BLOOD SUGAR THREE TIMES DAILY - DX: E11.9 06/03/16   Cassandria Anger, MD  GLOBAL EASE INJECT PEN NEEDLES 31G X 8 MM MISC USE AS DIRECTED FOUR TIMES DAILY 02/10/17   Loni Beckwith  W, MD    Physical Exam: Vitals:   02/17/17 1425 02/17/17 1430 02/17/17 1800 02/17/17 1955  BP: (!) 112/57 (!) 106/59 (!) 121/55   Pulse: (!) 59 67 65   Resp: 20 16 16    Temp:   98.6 F (37 C)   TempSrc:   Oral   SpO2: 96% 93% 96% 97%  Weight:   104.9 kg (231 lb 4.8 oz)   Height:   5' 2"  (1.575 m)      General:  Appears calm and comfortable and is NAD Eyes:  PERRL, EOMI, normal lids, iris ENT:  grossly normal hearing, lips & tongue, mmm; missing mid-lower dentition Neck:  no LAD, masses or thyromegaly; no carotid bruits Cardiovascular:  RRR, no m/r/g. No LE edema.  Respiratory:   CTA bilaterally with no wheezes/rales/rhonchi.  Normal respiratory effort. Abdomen:  soft, NT, ND, NABS Skin:  no rash or induration seen on limited exam Musculoskeletal:  grossly normal tone BUE/BLE, good ROM, no bony abnormality Lower extremity:  No LE edema.  Limited foot exam with no ulcerations.  2+ distal pulses. Psychiatric:  grossly normal mood and affect, speech fluent and appropriate, AOx3 Neurologic:  CN 2-12 grossly intact, moves all extremities in coordinated fashion, sensation intact    Radiological Exams on Admission: Dg Chest Port 1 View  Result Date: 02/17/2017 CLINICAL DATA:  Chest pain intermittently for a few days, worse today. Some shortness of breath. EXAM: PORTABLE CHEST 1 VIEW COMPARISON:  Thoracic spine radiographs 10/16/2014. Chest radiographs 02/25/2014. FINDINGS: The cardiac silhouette remains mildly enlarged. The patient has taken a shallower inspiration than on the prior chest radiographs. No confluent airspace opacity, edema, sizable pleural effusion, or pneumothorax is identified. No acute osseous abnormality is seen. IMPRESSION: Cardiomegaly without evidence of acute cardiopulmonary process. Electronically Signed   By: Logan Bores M.D.   On: 02/17/2017 14:15    EKG: Independently reviewed.  NSR with rate 64; low voltage, nonspecific ST changes with no  evidence of acute ischemia; NSCSLT   Labs on Admission: I have personally reviewed the available labs and imaging studies at the time of the admission.  Pertinent labs:   Glucose 207 Troponin <0.03 Normal CBC A1c 7.8 on 11/26 Normal TSH/free T4 on 11/26  Assessment/Plan Principal Problem:   Chest pain Active Problems:   Mixed hyperlipidemia   OBSTRUCTIVE SLEEP APNEA   Chronic diastolic heart failure (HCC)   Type 2 diabetes mellitus with vascular disease (HCC)   Anxiety and depression   Morbid obesity due to excess calories ([place])   Essential hypertension, benign   Chest pain -Patient with left-sided chest pressure that has come on intermittently for days, appears to resolve spontaneously. -0-1/3 typical symptoms suggestive of noncardiac chest pain.  -CXR unremarkable.   -Initial cardiac troponin negative.  -EKG not indicative of acute ischemia.   -GRACE score is 99; which predicts an in-hospital death rate of 0.8%.  -Will plan to place in observation status on telemetry to rule out ACS by overnight observation.  -cycle troponin q6h x 3 and repeat EKG in AM -Continue ASA 81 mg  daily -morphine given -Risk factor stratification with FLP -Cardiology consultation in AM - NPO for possible stress test -Will order Echo given progressive SOB and LE edema complaint (not really seen on exam)  -Will plan to start Heparin drip if enzymes are positive and/or chest pain recurs -Patient appears to be on Voltaren gel as well as both Mobic and Motrin; will stop all NSAIDs for now -Also  taking Premarin, will hold  HTN -Takes low-dose Toprol monotherapy at home -Patient with suboptimal to good control while in the ER -Consideration addition of Lisinopril   HLD -Continue Lipitor -Lipids were checked in 1/18 (TC 184, HDL 77, LDL 50, TG 284); will repeat  DM -Last A1c was 7.8 last week -Continue Lantus -Hold Glucophage -Cover with moderate-scale SSI  OSA -Continue CPAP -Patient  does not know how setting so will use autopap/RT assistance  HFpEF -Grade 1 diastolic dysfunction in 28/76 -Now with ?CHF symptoms but fairly minimal -Will check BNP -Will order echo  Anxiety and depression -She is on an extensive list of psychiatric medications, which will be continued, to include: Wellbutrin; Celexa; Klonopin; Cymbalta; Neurontin; Topamax; and Trazodone -Also with chronic pain, on Robaxin, MSContin -I have reviewed this patient in the [place] Controlled Substances Reporting System.  She is generally receiving medications from only one provider and appears to be taking them as prescribed. -She is at increased risk of opioid misuse, diversion, or overdose.  DVT prophylaxis: Lovenox Code Status:  Full - confirmed with patient Disposition Plan:  Home once clinically improved Consults called: Cardiology  Admission status: It is my clinical opinion that referral for OBSERVATION is reasonable and necessary in this patient based on the above information provided. The aforementioned taken together are felt to place the patient at high risk for further clinical deterioration. However it is anticipated that the patient may be medically stable for discharge from the hospital within 24 to 48 hours.   Emily Bongo MD [facility]  If note is complete, please contact covering daytime or nighttime physician. [email or web address] Password [email or web address]  02/17/2017, 9:01 PM

## 2017-02-17 NOTE — ED Notes (Signed)
Patient ambulatory to restroom without assistance. 

## 2017-02-17 NOTE — ED Triage Notes (Signed)
Chest pain onset 0900 today

## 2017-02-17 NOTE — Progress Notes (Signed)
Subjective:    Patient ID: [name], female    DOB: June 28, 1950. Patient is being seen in f/u for management of diabetes,hyperlipidemia, and hypertension.  Past Medical History:  Diagnosis Date  . Anxiety   . Anxiety and depression   . Arthritis   . CAD (coronary artery disease)    Stent placement circumflex coronary 2007, catheterization 2008 patent stents. Normal LV function  . Chest pain   . CHF (congestive heart failure) ([place])   . COPD (chronic obstructive pulmonary disease) ([place])   . Depression   . Diabetes mellitus    Insulin dependent  . Diabetic polyneuropathy (H[facility])     severe on multiple medications  . Dyslipidemia   . GERD (gastroesophageal reflux disease)   . Headache(784.0)   . 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 Hypertension   . MI (myocardial infarction) ([place])    2007  . Obstructive sleep apnea    CPAP machine at night  . Palpitations   . Tachycardia    Past Surgical History:  Procedure Laterality Date  . ABDOMINAL HYSTERECTOMY    . Arm Surgery Bilateral    due to fall-pt broke both forearms  . BIOPSY N/A 07/01/2013   Procedure: BIOPSY;  Surgeon: Rogene Houston, MD;  Location: [facility];  Service: Endoscopy;  Laterality: N/A;  . CHOLECYSTECTOMY    . COLONOSCOPY  08/06/2011   Procedure: COLONOSCOPY;  Surgeon: Rogene Houston, MD;  Location: [facility];  Service: Endoscopy;  Laterality: N/A;  215  . COLONOSCOPY WITH PROPOFOL N/A 12/31/2012   Procedure: COLONOSCOPY WITH PROPOFOL;  Surgeon: Rogene Houston, MD;  Location: [facility];  Service: Endoscopy;  Laterality: N/A;  in cecum at 0814, total withdrawal time 27min  . CORONARY ANGIOPLASTY WITH STENT PLACEMENT    . ESOPHAGEAL DILATION N/A 06/07/2015   Procedure: ESOPHAGEAL DILATION;  Surgeon: Rogene Houston, MD;  [facility];  Service: Endoscopy;  Laterality: N/A;  . ESOPHAGOGASTRODUODENOSCOPY N/A 06/07/2015   Procedure: ESOPHAGOGASTRODUODENOSCOPY (EGD);  Surgeon: Rogene Houston, MD;  [facility]: [facility];  Service: Endoscopy;  Laterality: N/A;  2:45 - moved to 1:00 - Ann to notify pt  . ESOPHAGOGASTRODUODENOSCOPY (EGD) WITH PROPOFOL N/A 12/31/2012   Procedure: ESOPHAGOGASTRODUODENOSCOPY (EGD) WITH PROPOFOL;  Surgeon: Rogene Houston, MD;  Location: [facility];  Service: Endoscopy;  Laterality: N/A;  GE junction 37,  . ESOPHAGOGASTRODUODENOSCOPY (EGD) WITH PROPOFOL N/A 07/01/2013   Procedure: ESOPHAGOGASTRODUODENOSCOPY (EGD) WITH PROPOFOL;  Surgeon: Rogene Houston, MD;  Location: [facility];  Service: Endoscopy;  Laterality: N/A;  950  . MALONEY DILATION N/A 12/31/2012   Procedure: MALONEY DILATION;  Surgeon: Rogene Houston, MD;  Location: [facility];  Service: Endoscopy;  Laterality: N/A;  used a # 54,#56  . MALONEY DILATION N/A 07/01/2013   Procedure: Venia Minks DILATION;  Surgeon: Rogene Houston, MD;  Location: [facility];  Service: Endoscopy;  Laterality: N/A;  54/58; no heme present   Social History   Socioeconomic History  . Marital status: Legally Separated    Spouse name: None  . Number of children: 4  . Years of education: None  . Highest education level: None  Social Needs  . Financial resource strain: None  . Food insecurity - worry: None  . Food insecurity - inability: None  . Transportation needs - medical: None  . Transportation needs - non-medical: None  Occupational History  . Occupation: Disabled  Tobacco Use  . Smoking status: Never Smoker  .  Smokeless tobacco: Never Used  Substance and Sexual Activity  . Alcohol use: No    Alcohol/week: 0.0 oz  . Drug use: No  . Sexual activity: None  Other Topics Concern  . None  Social History Narrative   Patient does not get regular exercise   Denies caffeine use    Outpatient Encounter Medications as of 02/17/2017  Medication Sig  . ACCU-CHEK AVIVA PLUS test strip USE TO CHECK BLOOD SUGAR THREE TIMES DAILY - DX: E11.9  . aspirin EC 81 MG tablet Take 81 mg by mouth daily.  Marland Kitchen atorvastatin (LIPITOR) 40 MG tablet Take 40 mg by  mouth daily.  . beclomethasone (QVAR) 40 MCG/ACT inhaler Inhale 1 puff into the lungs 2 (two) times daily.  Marland Kitchen buPROPion (WELLBUTRIN SR) 100 MG 12 hr tablet Take 100 mg by mouth daily.  . citalopram (CELEXA) 40 MG tablet TAKE ONE TABLET BY MOUTH EVERY EVENING (CORRECTED DOSE)  . clonazePAM (KLONOPIN) 0.5 MG tablet Take 1 tablet (0.5 mg total) by mouth 2 (two) times daily as needed for anxiety.  . diclofenac sodium (VOLTAREN) 1 % GEL APPLY TO BOTH KNEES THREE TIMES DAILY  . DULoxetine (CYMBALTA) 30 MG capsule Take 1 capsule (30 mg total) by mouth daily.  . DUREZOL 0.05 % EMUL INSTILL ONE DROP INTO THE RIGHT EYE THREE TIMES DAILY, TAPER OVER THREE WEEKS  . estrogens, conjugated, (PREMARIN) 1.25 MG tablet Take 1.25 mg by mouth daily.   . fluticasone-salmeterol (ADVAIR HFA) 115-21 MCG/ACT inhaler Inhale 2 puffs into the lungs 2 (two) times daily.  Marland Kitchen gabapentin (NEURONTIN) 400 MG capsule Take 400 mg by mouth 3 (three) times daily.  Marland Kitchen GLOBAL EASE INJECT PEN NEEDLES 31G X 8 MM MISC USE AS DIRECTED FOUR TIMES DAILY  . ibuprofen (ADVIL,MOTRIN) 200 MG tablet Take 200 mg by mouth daily as needed for headache.  Marland Kitchen LANTUS SOLOSTAR 100 UNIT/ML Solostar Pen INJECT 50 UNITS INTO THE SKIN AT BEDTIME.  Marland Kitchen lidocaine (XYLOCAINE) 5 % ointment Apply 1 application topically at bedtime.   . meloxicam (MOBIC) 7.5 MG tablet Take 7.5 mg by mouth 2 (two) times daily.  . metFORMIN (GLUCOPHAGE) 500 MG tablet TAKE ONE TABLET BY MOUTH TWICE DAILY with a meal  . methocarbamol (ROBAXIN) 500 MG tablet Take 1 tablet (500 mg total) by mouth 3 (three) times daily.  . metoprolol succinate (TOPROL-XL) 50 MG 24 hr tablet TAKE 1 AND 1/2 TABLETS DAILY 75MG   . montelukast (SINGULAIR) 10 MG tablet Take 10 mg by mouth daily.  Marland Kitchen morphine (MS CONTIN) 30 MG 12 hr tablet Take 1 tablet (30 mg total) by mouth every 8 (eight) hours.  . nitroGLYCERIN (NITROSTAT) 0.4 MG SL tablet Place 1 tablet (0.4 mg total) under the tongue every 5 (five) minutes x 3  doses as needed. For chest pains  . ondansetron (ZOFRAN) 4 MG tablet Take 4 mg by mouth every 6 (six) hours as needed for nausea or vomiting.  . pantoprazole (PROTONIX) 40 MG tablet TAKE ONE TABLET BY MOUTH TWICE DAILY BEFORE MEALS  . potassium chloride (K-DUR,KLOR-CON) 10 MEQ tablet Take 10 mEq by mouth 2 (two) times daily.  Marland Kitchen PROAIR HFA 108 (90 Base) MCG/ACT inhaler inhale TWO puffs FOUR TIMES DAILY AS NEEDED  . promethazine (PHENERGAN) 25 MG tablet Take 25 mg by mouth 4 (four) times daily as needed.  . psyllium (METAMUCIL SMOOTH TEXTURE) 28 % packet Take 1 packet by mouth at bedtime.  . ranitidine (ZANTAC) 300 MG tablet Take 300 mg by mouth  2 (two) times daily.  Marland Kitchen topiramate (TOPAMAX) 200 MG tablet Take 200 mg by mouth at bedtime.  . traZODone (DESYREL) 50 MG tablet Take 50 mg by mouth at bedtime.  . [DISCONTINUED] INVOKANA 100 MG TABS tablet TAKE ONE TABLET BY MOUTH EVERY MORNING   No facility-administered encounter medications on file as of 02/17/2017.    ALLERGIES: Allergies  Allergen Reactions  . Nitrofuran Derivatives Itching and Swelling  . Amphetamine-Dextroamphet Er Swelling  . Pregabalin Swelling  . Topiramate Other (See Comments)    Tongue tingle  . Verelan [Verapamil] Rash   VACCINATION STATUS:  There is no immunization history on file for this patient.  Diabetes  She presents for her follow-up diabetic visit. She has type 2 diabetes mellitus. Onset time: She was diagnosed at approximate age of 27 years. Her disease course has been stable. There are no hypoglycemic associated symptoms. Pertinent negatives for hypoglycemia include no confusion, headaches, pallor or seizures. Associated symptoms include fatigue. Pertinent negatives for diabetes include no chest pain, no polydipsia, no polyphagia and no polyuria. There are no hypoglycemic complications. Symptoms are stable. Diabetic complications include autonomic neuropathy, heart disease, peripheral neuropathy and  retinopathy. Risk factors for coronary artery disease include dyslipidemia, diabetes mellitus, obesity, hypertension and sedentary lifestyle. Current diabetic treatments: Lantus 65 units daily at bedtime, Humalog 15 units 3 times a day before meals. She mentions allergy to Victoza, Byetta, and metformin. Her weight is increasing steadily. She is following a generally unhealthy diet. When asked about meal planning, she reported none. She has not had a previous visit with a dietitian. She never participates in exercise. Her breakfast blood glucose range is generally 140-180 mg/dl. Her overall blood glucose range is 140-180 mg/dl. An ACE inhibitor/angiotensin II receptor blocker is not being taken. Eye exam is current.  Hyperlipidemia  This is a chronic problem. The current episode started more than 1 year ago. The problem is uncontrolled. Recent lipid tests were reviewed and are high. Exacerbating diseases include diabetes and obesity. Pertinent negatives include no chest pain, myalgias or shortness of breath. Current antihyperlipidemic treatment includes statins. Risk factors for coronary artery disease include diabetes mellitus, dyslipidemia, hypertension, obesity and a sedentary lifestyle.  Hypertension  This is a chronic problem. The current episode started more than 1 year ago. Pertinent negatives include no chest pain, headaches, palpitations or shortness of breath. Risk factors for coronary artery disease include diabetes mellitus, dyslipidemia, obesity and sedentary lifestyle. Hypertensive end-organ damage includes CAD/MI and retinopathy.    Review of Systems  Constitutional: Positive for fatigue. Negative for chills, fever and unexpected weight change.  HENT: Negative for trouble swallowing and voice change.   Eyes: Negative for visual disturbance.  Respiratory: Positive for chest tightness. Negative for cough, shortness of breath and wheezing.   Cardiovascular: Negative for chest pain,  palpitations and leg swelling.  Gastrointestinal: Negative for diarrhea, nausea and vomiting.  Endocrine: Negative for cold intolerance, heat intolerance, polydipsia, polyphagia and polyuria.  Musculoskeletal: Negative for arthralgias and myalgias.  Skin: Negative for color change, pallor, rash and wound.  Neurological: Negative for seizures and headaches.  Psychiatric/Behavioral: Negative for confusion and suicidal ideas.    Objective:    BP (!) 168/80   Pulse 66   Wt 232 lb (105.2 kg)   SpO2 97%   BMI 39.82 kg/m   Wt Readings from Last 3 Encounters:  02/17/17 232 lb (105.2 kg)  11/11/16 228 lb (103.4 kg)  10/16/16 230 lb (104.3 kg)    Physical Exam  Constitutional: She is oriented to person, place, and time. She appears well-developed.  HENT:  Head: Normocephalic and atraumatic.  She has very poor dentition.  Eyes: EOM are normal.  Neck: Normal range of motion. Neck supple. No tracheal deviation present. No thyromegaly present.  Cardiovascular: Normal rate and regular rhythm.  No reproducible chest wall tenderness.  Pulmonary/Chest: Effort normal and breath sounds normal.  Abdominal: Soft. Bowel sounds are normal. There is no tenderness. There is no guarding.  Musculoskeletal: Normal range of motion. She exhibits no edema.  Neurological: She is alert and oriented to person, place, and time. She has normal reflexes. No cranial nerve deficit. Coordination normal.  Skin: Skin is warm and dry. No rash noted. No erythema. No pallor.  Psychiatric: She has a normal mood and affect. Judgment normal.    CMP     Component Value Date/Time   NA 139 02/09/2017 1025   NA 143 11/06/2016 1112   K 4.5 02/09/2017 1025   CL 106 02/09/2017 1025   CO2 28 02/09/2017 1025   GLUCOSE 142 (H) 02/09/2017 1025   BUN 13 02/09/2017 1025   BUN 11 11/06/2016 1112   CREATININE 0.65 02/09/2017 1025   CALCIUM 8.7 02/09/2017 1025   PROT 5.5 (L) 11/06/2016 1112   ALBUMIN 3.4 (L) 11/06/2016 1112    AST 8 11/06/2016 1112   ALT 8 11/06/2016 1112   ALKPHOS 76 11/06/2016 1112   BILITOT 0.3 11/06/2016 1112   GFRNONAA 86 11/06/2016 1112   GFRNONAA >89 01/08/2016 1529   GFRAA 100 11/06/2016 1112   GFRAA >89 01/08/2016 1529   Diabetic Labs (most recent): Lab Results  Component Value Date   HGBA1C 7.8 (H) 02/09/2017   HGBA1C 7.6 (H) 07/31/2016   HGBA1C 7.6 (H) 04/15/2016    Lipid Panel     Component Value Date/Time   CHOL 184 04/15/2016 1121   TRIG 284 (H) 04/15/2016 1121   HDL 77 04/15/2016 1121   CHOLHDL 2.7 07/05/2015 0937   VLDL 53 (H) 07/05/2015 0937   LDLCALC 50 04/15/2016 1121     Assessment & Plan:   1. Type 2 diabetes mellitus with vascular disease ([place]) - Patient had chest pain for 2 hours despite high-dose of nitroglycerin. She has prior history of myocardial infarction. She is sent to emergency room for better evaluation. - Patient has currently uncontrolled symptomatic type 2 DM since  66 years of age. - She came with controlled fasting blood glucose profile, A1c remains unchanged at 7.8%, progressively improving from 8.6%.    Recent labs reviewed.  Her diabetes is complicated by coronary artery disease status post stent placement and patient remains at a high risk for more acute and chronic complications of diabetes which include CAD, CVA, CKD, retinopathy, and neuropathy. These are all discussed in detail with the patient.  - I have counseled the patient on diet management and weight loss, by adopting a carbohydrate restricted/protein rich diet.  -  Suggestion is made for her to avoid simple carbohydrates  from her diet including Cakes, Sweet Desserts / Pastries, Ice Cream, Soda (diet and regular), Sweet Tea, Candies, Chips, Cookies, Store Bought Juices, Alcohol in Excess of  1-2 drinks a day, Artificial Sweeteners, and "Sugar-free" Products. This will help patient to have stable blood glucose profile and potentially avoid unintended weight gain.   - I  encouraged the patient to switch to  unprocessed or minimally processed complex starch and increased protein intake (animal or plant source), fruits, and vegetables.  -  Patient is advised to stick to a routine mealtimes to eat 3 meals  a day and avoid unnecessary snacks ( to snack only to correct hypoglycemia).   - I have approached patient with the following individualized plan to manage diabetes and patient agrees:   - She has normal renal function. - After her emergency room evaluation she will continue to use Lantus 40 units daily at bedtime and  continue to hold off prandial insulin Humalog for now .   - I advised her to continue monitoring of blood glucose before meals breakfast and at bedtime.  - Patient is warned not to take insulin without proper monitoring per orders. -Adjustment parameters are given for hypo and hyperglycemia in writing. -Patient is encouraged to call clinic for blood glucose levels less than 70 or above 200 mg /dl. -She mentions allergy to a number of use for medications unfortunately, Victoza, Byetta. - I will continue  metformin 500 mg by mouth twice a day. -She will discontinue Invokana .  - Patient specific target  A1c;  LDL, HDL, Triglycerides, and  Waist Circumference were discussed in detail.  2) BP/HTN: uncontrolled. Continue current medications . 3) Lipids/HPL:  Uncontrolled with triglycerides   still high at 284, LDL better at 50, HDL at 77. Advised her to continue  Lipitor 40 mg by mouth daily at bedtime. Better control of diabetes will help for triglycerides. 4)  Weight/Diet: CDE Consult in progress  , exercise, and detailed carbohydrates information provided.  5) Chronic Care/Health Maintenance:  -Patient is on Statin medications and encouraged to continue to follow up with Ophthalmology, Podiatrist at least yearly or according to recommendations, and advised to   stay away from smoking. I have recommended yearly flu vaccine and pneumonia  vaccination at least every 5 years; moderate intensity exercise for up to 150 minutes weekly; and  sleep for at least 7 hours a day.  - Time spent with the patient: 25 min, of which >50% was spent in reviewing her sugar logs , discussing her hypo- and hyper-glycemic episodes, reviewing her current and  previous labs and insulin doses and developing a plan to avoid hypo- and hyper-glycemia.   - I advised patient to maintain close follow up with Glenda Chroman, MD for primary care needs.  Follow up plan: - Patient to go to emergency room for better evaluation of chest pain. Return in about 3 months (around 05/18/2017) for follow up with pre-visit labs, meter, and logs.  Glade Lloyd, MD Phone: [phone]  Fax: [phone]  This note was partially dictated with voice recognition software. Similar sounding words can be transcribed inadequately or may not  be corrected upon review.  02/17/2017, 1:08 PM

## 2017-02-17 NOTE — Patient Instructions (Signed)

## 2017-02-18 ENCOUNTER — Encounter: Payer: Self-pay | Admitting: Registered Nurse

## 2017-02-18 ENCOUNTER — Encounter: Payer: Medicaid Other | Admitting: Registered Nurse

## 2017-02-18 DIAGNOSIS — G4733 Obstructive sleep apnea (adult) (pediatric): Secondary | ICD-10-CM | POA: Diagnosis not present

## 2017-02-18 DIAGNOSIS — R079 Chest pain, unspecified: Secondary | ICD-10-CM | POA: Diagnosis not present

## 2017-02-18 DIAGNOSIS — F329 Major depressive disorder, single episode, unspecified: Secondary | ICD-10-CM | POA: Diagnosis not present

## 2017-02-18 DIAGNOSIS — I5032 Chronic diastolic (congestive) heart failure: Secondary | ICD-10-CM | POA: Diagnosis not present

## 2017-02-18 DIAGNOSIS — E782 Mixed hyperlipidemia: Secondary | ICD-10-CM | POA: Diagnosis not present

## 2017-02-18 DIAGNOSIS — F419 Anxiety disorder, unspecified: Secondary | ICD-10-CM | POA: Diagnosis not present

## 2017-02-18 DIAGNOSIS — E1159 Type 2 diabetes mellitus with other circulatory complications: Secondary | ICD-10-CM | POA: Diagnosis not present

## 2017-02-18 DIAGNOSIS — I10 Essential (primary) hypertension: Secondary | ICD-10-CM | POA: Diagnosis not present

## 2017-02-18 LAB — GLUCOSE, CAPILLARY
Glucose-Capillary: 115 mg/dL — ABNORMAL HIGH (ref 65–99)
Glucose-Capillary: 183 mg/dL — ABNORMAL HIGH (ref 65–99)

## 2017-02-18 LAB — LIPID PANEL
Cholesterol: 151 mg/dL (ref 0–200)
HDL: 46 mg/dL (ref >40)
LDL Cholesterol: UNDETERMINED mg/dL (ref 0–99)
Total CHOL/HDL Ratio: 3.3 RATIO
Triglycerides: 439 mg/dL — ABNORMAL HIGH (ref <150)
VLDL: UNDETERMINED mg/dL (ref 0–40)

## 2017-02-18 LAB — TROPONIN I: Troponin I: <0.03 ng/mL (ref <0.03)

## 2017-02-18 LAB — HIV ANTIBODY (ROUTINE TESTING W REFLEX): HIV Screen 4th Generation wRfx: NONREACTIVE

## 2017-02-18 MED ORDER — ISOSORBIDE MONONITRATE ER 60 MG PO TB24
30.0000 mg | ORAL_TABLET | Freq: Every day | ORAL | Status: DC
  Administered 2017-02-18: 30 mg via ORAL
  Filled 2017-02-18: qty 1

## 2017-02-18 MED ORDER — ISOSORBIDE MONONITRATE ER 30 MG PO TB24: 30.0000 mg | ORAL_TABLET | Freq: Every day | ORAL | 0 refills | Status: DC

## 2017-02-18 MED ORDER — RANITIDINE HCL 300 MG PO TABS
300.0000 mg | ORAL_TABLET | Freq: Every evening | ORAL | 3 refills | Status: DC | PRN
Start: 2017-02-18 — End: 2019-07-15

## 2017-02-18 MED ORDER — MELOXICAM 7.5 MG PO TABS: 7.5000 mg | ORAL_TABLET | Freq: Every day | ORAL | 4 refills | Status: DC

## 2017-02-18 NOTE — Consult Note (Signed)
Cardiology Consultation:   Patient ID: [name]; [id number]; 1950-05-07   Admit date: 02/17/2017 Date of Consult: 02/18/2017  Primary Care Provider: Glenda Chroman, MD Primary Cardiologist: No primary care provider on file.  [name] Primary Electrophysiologist: N/A   Patient Profile:   [name] is a 66 y.o. female with a hx of CAD who is being seen today for the evaluation of chest pain at the request of [name].  History of Present Illness:   [name] has a history of an MI in 2007 treated with PCI of the circumflex.  She had normal nuclear stress test 08/29/16.  Chest pain was relieved with doubling up on her PPI.  She also has a history of IDDM, OSA on CPAP, GERD, chronic diastolic CHF, tachycardia palpitations, dyslipidemia and chronic pain, anxiety depression.  Patient presents with several day history of recurrent chest pain.2 nights ago she had a heaviness in her chest and took 2 NTG. She doesn't know if it helped or if she just fell asleep. Yesterday when she awakened she had chest heaviness and shortness of breath, no radiation, dizziness or presyncope. Pain has been off/on since she's been here. Laying comfortably in bed and says she still is having it. No nausea or GERD but she does have trouble swallowing. Walks dog 15 min daily and often has to stop for chest heaviness but that's been going on for years. No increase stress. EKG unchanged, Troponins negative x 3, BNP 40, other labs stable.   Past Medical History:  Diagnosis Date  . Anxiety and depression   . Arthritis   . CAD (coronary artery disease)    Stent placement circumflex coronary 2007, catheterization 2008 patent stents. Normal LV function  . Chest pain   . CHF (congestive heart failure) ([place])   . COPD (chronic obstructive pulmonary disease) (HCC)    no home O2  . Depression   . Diabetes mellitus    Insulin dependent  . Diabetic polyneuropathy (H[facility])     severe on multiple medications  .  Dyslipidemia   . GERD (gastroesophageal reflux disease)   . Headache(784.0)   . 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 Hypertension   . MI (myocardial infarction) ([place])    2007  . Obstructive sleep apnea    CPAP, setting ?2  . Palpitations   . Tachycardia     Past Surgical History:  Procedure Laterality Date  . ABDOMINAL HYSTERECTOMY    . Arm Surgery Bilateral    due to fall-pt broke both forearms  . BIOPSY N/A 07/01/2013   Procedure: BIOPSY;  Surgeon: Emily Houston, MD;  Location: [facility];  Service: Endoscopy;  Laterality: N/A;  . CHOLECYSTECTOMY    . COLONOSCOPY  08/06/2011   Procedure: COLONOSCOPY;  Surgeon: Emily Houston, MD;  Location: [facility];  Service: Endoscopy;  Laterality: N/A;  215  . COLONOSCOPY WITH PROPOFOL N/A 12/31/2012   Procedure: COLONOSCOPY WITH PROPOFOL;  Surgeon: Emily Houston, MD;  Location: [facility];  Service: Endoscopy;  Laterality: N/A;  in cecum at 0814, total withdrawal time 20mn  . CORONARY ANGIOPLASTY WITH STENT PLACEMENT    . ESOPHAGEAL DILATION N/A 06/07/2015   Procedure: ESOPHAGEAL DILATION;  Surgeon: Emily Houston MD;  [facility];  Service: Endoscopy;  Laterality: N/A;  . ESOPHAGOGASTRODUODENOSCOPY N/A 06/07/2015   Procedure: ESOPHAGOGASTRODUODENOSCOPY (EGD);  Surgeon: Emily Houston MD;  [facility];  Service: Endoscopy;  Laterality: N/A;  2:45 - moved  to 1:00 - [name] to notify pt  . ESOPHAGOGASTRODUODENOSCOPY (EGD) WITH PROPOFOL N/A 12/31/2012   Procedure: ESOPHAGOGASTRODUODENOSCOPY (EGD) WITH PROPOFOL;  Surgeon: Emily Houston, MD;  Location: [facility];  Service: Endoscopy;  Laterality: N/A;  GE junction 37,  . ESOPHAGOGASTRODUODENOSCOPY (EGD) WITH PROPOFOL N/A 07/01/2013   Procedure: ESOPHAGOGASTRODUODENOSCOPY (EGD) WITH PROPOFOL;  Surgeon: Emily Houston, MD;  Location: [facility];  Service: Endoscopy;  Laterality: N/A;  950  . MALONEY DILATION N/A 12/31/2012   Procedure: MALONEY DILATION;  Surgeon: Emily Houston, MD;   Location: [facility];  Service: Endoscopy;  Laterality: N/A;  used a # 54,#56  . MALONEY DILATION N/A 07/01/2013   Procedure: [name] DILATION;  Surgeon: Emily Houston, MD;  Location: [facility];  Service: Endoscopy;  Laterality: N/A;  54/58; no heme present     Home Medications:  Prior to Admission medications   Medication Sig Start Date End Date Taking? Authorizing Provider  aspirin EC 81 MG tablet Take 81 mg by mouth daily.   Yes [provider]  atorvastatin (LIPITOR) 40 MG tablet Take 40 mg by mouth daily.   Yes [provider]  beclomethasone (QVAR) 40 MCG/ACT inhaler Inhale 1 puff into the lungs 2 (two) times daily.   Yes [provider]  buPROPion (WELLBUTRIN SR) 100 MG 12 hr tablet Take 100 mg by mouth daily.   Yes [provider]  citalopram (CELEXA) 40 MG tablet TAKE ONE TABLET BY MOUTH EVERY EVENING (CORRECTED DOSE) 11/24/16  Yes [provider]  clonazePAM (KLONOPIN) 0.5 MG tablet Take 1 tablet (0.5 mg total) by mouth 2 (two) times daily as needed for anxiety. Patient taking differently: Take 0.5 mg by mouth 2 (two) times daily.  01/14/17  Yes Emily Sensing Hopkins, Hopkins  diclofenac sodium (VOLTAREN) 1 % GEL APPLY TO BOTH KNEES THREE TIMES DAILY 01/21/17  Yes [name]  DULoxetine (CYMBALTA) 30 MG capsule Take 1 capsule (30 mg total) by mouth daily. 12/24/16  Yes Emily Sensing Hopkins, Hopkins  DUREZOL 0.05 % EMUL INSTILL ONE DROP INTO THE RIGHT EYE THREE TIMES DAILY, TAPER OVER THREE WEEKS 10/15/16  Yes [provider]  estrogens, conjugated, (PREMARIN) 1.25 MG tablet Take 1.25 mg by mouth daily.    Yes [provider]  fluticasone-salmeterol (ADVAIR HFA) 115-21 MCG/ACT inhaler Inhale 2 puffs into the lungs 2 (two) times daily.   Yes [provider]  gabapentin (NEURONTIN) 400 MG capsule Take 400 mg by mouth 3 (three) times daily. 11/24/16  Yes [provider]  ibuprofen (ADVIL,MOTRIN) 200 MG tablet Take 200 mg by mouth  daily as needed for headache.   Yes [provider]  LANTUS SOLOSTAR 100 UNIT/ML Solostar Pen INJECT 50 UNITS INTO THE SKIN AT BEDTIME. 02/02/17  Yes Nida, Marella Chimes, MD  lidocaine (XYLOCAINE) 5 % ointment Apply 1 application topically at bedtime.  07/17/11  Yes [provider]  meloxicam (MOBIC) 7.5 MG tablet Take 7.5 mg by mouth 2 (two) times daily. 12/16/16  Yes [provider]  metFORMIN (GLUCOPHAGE) 500 MG tablet TAKE ONE TABLET BY MOUTH TWICE DAILY with a meal 02/11/17  Yes Nida, Marella Chimes, MD  methocarbamol (ROBAXIN) 500 MG tablet Take 1 tablet (500 mg total) by mouth 3 (three) times daily. 06/30/16  Yes Kirsteins, Luanna Salk, MD  metoprolol succinate (TOPROL-XL) 50 MG 24 hr tablet TAKE 1 AND 1/2 TABLETS DAILY 75MG 08/20/16  Yes Lendon Colonel, Hopkins  montelukast (SINGULAIR) 10 MG tablet Take 10  mg by mouth daily. 11/24/16  Yes [provider]  morphine (MS CONTIN) 30 MG 12 hr tablet Take 1 tablet (30 mg total) by mouth every 8 (eight) hours. 01/14/17  Yes [name]  Multiple Vitamin (MULTIVITAMIN WITH MINERALS) TABS tablet Take 1 tablet by mouth daily.   Yes [provider]  nitroGLYCERIN (NITROSTAT) 0.4 MG SL tablet Place 1 tablet (0.4 mg total) under the tongue every 5 (five) minutes x 3 doses as needed. For chest pains 10/11/13  Yes Herminio Commons, MD  ondansetron (ZOFRAN) 4 MG tablet Take 4 mg by mouth every 6 (six) hours as needed for nausea or vomiting.   Yes [provider]  pantoprazole (PROTONIX) 40 MG tablet TAKE ONE TABLET BY MOUTH TWICE DAILY BEFORE MEALS 01/21/17  Yes Setzer, Terri Hopkins, Hopkins  potassium chloride (K-DUR,KLOR-CON) 10 MEQ tablet Take 10 mEq by mouth 2 (two) times daily. 11/24/16  Yes [provider]  PROAIR HFA 108 (90 Base) MCG/ACT inhaler inhale TWO puffs FOUR TIMES DAILY AS NEEDED 11/24/16  Yes [provider]  promethazine (PHENERGAN) 25 MG tablet Take 25 mg by mouth 4 (four)  times daily as needed. 12/07/16  Yes [provider]  psyllium (METAMUCIL SMOOTH TEXTURE) 28 % packet Take 1 packet by mouth at bedtime. 06/14/13  Yes Rehman, Mechele Dawley, MD  ranitidine (ZANTAC) 300 MG tablet Take 300 mg by mouth 2 (two) times daily. 11/24/16  Yes [provider]  topiramate (TOPAMAX) 200 MG tablet Take 200 mg by mouth at bedtime. 11/24/16  Yes [provider]  traZODone (DESYREL) 50 MG tablet Take 50 mg by mouth at bedtime. 11/24/16  Yes [provider]  [place] test strip USE TO CHECK BLOOD SUGAR THREE TIMES DAILY - DX: E11.9 06/03/16   Nida, Marella Chimes, MD  GLOBAL EASE INJECT PEN NEEDLES 31G X 8 MM MISC USE AS DIRECTED FOUR TIMES DAILY 02/10/17   Cassandria Anger, MD    Inpatient Medications: Scheduled Meds: . aspirin EC  81 mg Oral Daily  . atorvastatin  40 mg Oral Daily  . buPROPion  100 mg Oral Daily  . citalopram  40 mg Oral Daily  . clonazePAM  0.5 mg Oral BID  . DULoxetine  30 mg Oral Daily  . enoxaparin (LOVENOX) injection  40 mg Subcutaneous Q24H  . famotidine  40 mg Oral BID  . gabapentin  400 mg Oral TID  . insulin aspart  0-15 Units Subcutaneous TID WC  . insulin aspart  0-5 Units Subcutaneous QHS  . insulin glargine  50 Units Subcutaneous QHS  . methocarbamol  500 mg Oral TID  . metoprolol succinate  75 mg Oral Daily  . mometasone-formoterol  2 puff Inhalation BID  . montelukast  10 mg Oral Daily  . morphine  30 mg Oral Q8H  . pantoprazole  40 mg Oral BID  . potassium chloride  10 mEq Oral BID  . psyllium  1 packet Oral QHS  . topiramate  200 mg Oral QHS  . traZODone  50 mg Oral QHS   Continuous Infusions:  PRN Meds: acetaminophen, albuterol, gi cocktail, morphine injection, nitroGLYCERIN, ondansetron (ZOFRAN) IV  Allergies:    Allergies  Allergen Reactions  . Nitrofuran Derivatives Itching and Swelling  . Amphetamine-Dextroamphet Er Swelling  . Pregabalin Swelling  . Topiramate Other (See  Comments)    Tongue tingle  . Verelan [Verapamil] Rash    Social History:   Social History  Socioeconomic History  . Marital status: Legally Separated    Spouse name: Not on file  . Number of children: 4  . Years of education: Not on file  . Highest education level: Not on file  Social Needs  . Financial resource strain: Not on file  . Food insecurity - worry: Not on file  . Food insecurity - inability: Not on file  . Transportation needs - medical: Not on file  . Transportation needs - non-medical: Not on file  Occupational History  . Occupation: Disabled  Tobacco Use  . Smoking status: Never Smoker  . Smokeless tobacco: Never Used  Substance and Sexual Activity  . Alcohol use: No    Alcohol/week: 0.0 oz  . Drug use: No  . Sexual activity: Not on file  Other Topics Concern  . Not on file  Social History Narrative   Patient does not get regular exercise   Denies caffeine use     Family History:    Family History  Problem Relation Age of Onset  . Heart attack Mother   . Colon cancer Mother   . Heart attack Father   . Stroke Sister   . Hemophilia Child   . Cancer Unknown   . Coronary artery disease Unknown   . Cancer Brother   . Cancer Maternal Aunt   . Cancer Maternal Uncle   . Cancer Paternal Aunt   . Cancer Paternal Uncle   . Cancer Brother      ROS:  Please see the history of present illness.  Review of Systems  Constitution: Negative.  HENT: Negative.   Eyes: Negative.   Cardiovascular: Positive for chest pain, dyspnea on exertion and palpitations.  Respiratory: Negative.   Hematologic/Lymphatic: Negative.   Musculoskeletal: Negative.  Negative for joint pain.  Gastrointestinal: Positive for dysphagia.  Genitourinary: Negative.   Neurological: Negative.     All other ROS reviewed and negative.     Physical Exam/Data:   Vitals:   02/17/17 1800 02/17/17 1955 02/17/17 2000 02/18/17 0500  BP: (!) 121/55  (!) 135/54 (!) 103/43  Pulse: 65  73  (!) 57  Resp: 16  18 16   Temp: 98.6 F (37 C)  (!) 94.7 F (34.8 C) (!) 97 F (36.1 C)  TempSrc: Oral  Oral Oral  SpO2: 96% 97% 97% 91%  Weight: 231 lb 4.8 oz (104.9 kg)     Height: 5' 2"  (1.575 m)       Intake/Output Summary (Last 24 hours) at 02/18/2017 0751 Last data filed at 02/17/2017 2100 Gross per 24 hour  Intake 480 ml  Output -  Net 480 ml   Filed Weights   02/17/17 1800  Weight: 231 lb 4.8 oz (104.9 kg)   Body mass index is 42.31 kg/m.  General: Obese, in no acute distress  HEENT: normal Lymph: no adenopathy Neck: no JVD Endocrine:  No thryomegaly Vascular: No carotid bruits; FA pulses 2+ bilaterally without bruits  Cardiac:  normal S1, S2; RRR; no murmur  Lungs:  clear to auscultation bilaterally, no wheezing, rhonchi or rales  Abd: soft, nontender, no hepatomegaly  Ext: no edema Musculoskeletal:  No deformities, BUE and BLE strength normal and equal Skin: warm and dry  Neuro:  CNs 2-12 intact, no focal abnormalities noted Psych:  Normal affect   EKG:  The EKG was personally reviewed and demonstrates: Normal sinus rhythm without acute change Telemetry:  Telemetry was personally reviewed and demonstrates:  NSR  Relevant CV Studies:  Nuclear stress  test 6/15/18There was no ST segment deviation noted during stress.  The study is normal.  This is a low risk study.  Nuclear stress EF: 68%.  2D echo 2014Study Conclusions  - Study data: Technically adequate study. - Left ventricle: The cavity size was normal. Wall thickness   was increased in a pattern of mild LVH. Systolic function   was normal. The estimated ejection fraction was in the   range of 60% to 65%. Doppler parameters are consistent   with abnormal left ventricular relaxation (grade 1   diastolic dysfunction). - Aortic valve: Valve area: 2.49cm^2(VTI). Valve area:   2.43cm^2 (Vmax). - Mitral valve: Calcified annulus. Mildly thickened leaflets   . - Right ventricle: The cavity size was  mildly dilated.   Laboratory Data:  Chemistry Recent Labs  Lab 02/17/17 1409  NA 137  K 3.9  CL 106  CO2 24  GLUCOSE 207*  BUN 14  CREATININE 0.69  CALCIUM 9.3  GFRNONAA >60  GFRAA >60  ANIONGAP 7    No results for input(s): PROT, ALBUMIN, AST, ALT, ALKPHOS, BILITOT in the last 168 hours. Hematology Recent Labs  Lab 02/17/17 1409  WBC 7.8  RBC 4.87  HGB 13.5  HCT 43.7  MCV 89.7  MCH 27.7  MCHC 30.9  RDW 13.5  PLT 359   Cardiac Enzymes Recent Labs  Lab 02/17/17 1409 02/17/17 1718 02/17/17 2300 02/18/17 0528  TROPONINI <0.03 <0.03 <0.03 <0.03   No results for input(s): TROPIPOC in the last 168 hours.  BNP Recent Labs  Lab 02/17/17 1409  BNP 40.0    DDimer No results for input(s): DDIMER in the last 168 hours.  Radiology/Studies:  Dg Chest Port 1 View  Result Date: 02/17/2017 CLINICAL DATA:  Chest pain intermittently for a few days, worse today. Some shortness of breath. EXAM: PORTABLE CHEST 1 VIEW COMPARISON:  Thoracic spine radiographs 10/16/2014. Chest radiographs 02/25/2014. FINDINGS: The cardiac silhouette remains mildly enlarged. The patient has taken a shallower inspiration than on the prior chest radiographs. No confluent airspace opacity, edema, sizable pleural effusion, or pneumothorax is identified. No acute osseous abnormality is seen. IMPRESSION: Cardiomegaly without evidence of acute cardiopulmonary process. Electronically Signed   By: Logan Bores M.D.   On: 02/17/2017 14:15    Assessment and Plan:   Chest pain with negative troponin x3 and no EKG changes, still having off/on here. Exam and labs unremarkable.recent normal nuclear stress test. Will try adding Imdur to see if this helps. Discuss with [name] further w/u vs discharge and follow as outpatient.  History of CAD status post MI in 2007 treated with stent to the circumflex.  Repeat cath 2008 mild nonobstructive disease of the circumflex and RCA EF 65%, normal nuclear stress  test 09/04/28  Chronic diastolic CHF last echo 8657 BNP normal, no CHF on exam.  Morbid obesity  Insulin-dependent diabetes mellitus well controlled.  Chronic pain  Anxiety depression  OSA on CPAP   For questions or updates, please contact [facility] Please consult [email or web address] for contact info under Cardiology/STEMI.   Signed, Ermalinda Barrios, PA-C  02/18/2017 7:51 AM   The patient was seen and examined, and I agree with the history, physical exam, assessment and plan as documented above, with modifications as noted below. I have also personally reviewed all relevant documentation, old records, labs, and both radiographic and cardiovascular studies. I have also independently interpreted old and new ECG's.  The patient is a 66 year old woman well known to me from the outpatient  setting.  I evaluated her for chest pain earlier this year and she underwent a normal nuclear stress test on 08/29/16.  She has a history of an MI in 2007 with PCI of the circumflex.  She also has a history of insulin-dependent diabetes mellitus, sleep apnea, irritable bowel syndrome, GERD, chronic diastolic heart failure, tachycardia and palpitations, and dyslipidemia.  She had been experiencing chest pain 2 nights in a row.  The first night she did nothing.  The second night she took 2 nitroglycerin tablets and is uncertain if this helped to alleviate her symptoms as she fell asleep.  Yesterday while getting ready to see her endocrinologist, she developed deep chest pain.  She denies exertional chest tightness.  She does have chronic exertional dyspnea.  She says pain is made worse with deep breathing and with sneezing.  Since being hospitalized yesterday, she has ruled out for acute coronary syndrome with serial normal troponins.  ECG and chest x-ray are unremarkable.  Lipid panel notable for hypertriglyceridemia.  HbA1c has been stable at 7.8% and she is followed by endocrinology.  Recommendations: Given  her normal nuclear stress test in June 2018, no further noninvasive cardiac testing is indicated at this time.  Given exacerbation of chest pain with sneezing and deep breathing, this is unlikely to be cardiac in etiology.  She certainly has several risk factors for progression of known coronary artery disease.  I will attempt isosorbide mononitrate 30 mg daily.  I will arrange for follow-up with me in about 4-6 weeks.   Kate Sable, MD, [facility]  02/18/2017 9:33 AM

## 2017-02-18 NOTE — Discharge Instructions (Signed)
Follow with Primary MD  Glenda Chroman, MD  and other consultant's as instructed your Hospitalist MD  Please get a complete blood count and chemistry panel checked by your Primary MD at your next visit, and again as instructed by your Primary MD.  Get Medicines reviewed and adjusted: Please take all your medications with you for your next visit with your Primary MD  Laboratory/radiological data: Please request your Primary MD to go over all hospital tests and procedure/radiological results at the follow up, please ask your Primary MD to get all Hospital records sent to his/her office.  In some cases, they will be blood work, cultures and biopsy results pending at the time of your discharge. Please request that your primary care M.D. follows up on these results.  Also Note the following: If you experience worsening of your admission symptoms, develop shortness of breath, life threatening emergency, suicidal or homicidal thoughts you must seek medical attention immediately by calling 911 or calling your MD immediately  if symptoms less severe.  You must read complete instructions/literature along with all the possible adverse reactions/side effects for all the Medicines you take and that have been prescribed to you. Take any new Medicines after you have completely understood and accpet all the possible adverse reactions/side effects.   Do not drive when taking Pain medications or sleeping medications (Benzodaizepines)  Do not take more than prescribed Pain, Sleep and Anxiety Medications. It is not advisable to combine anxiety,sleep and pain medications without talking with your primary care practitioner  Special Instructions: If you have smoked or chewed Tobacco  in the last 2 yrs please stop smoking, stop any regular Alcohol  and or any Recreational drug use.  Wear Seat belts while driving.  Please note: You were cared for by a hospitalist during your hospital stay. Once you are discharged,  your primary care physician will handle any further medical issues. Please note that NO REFILLS for any discharge medications will be authorized once you are discharged, as it is imperative that you return to your primary care physician (or establish a relationship with a primary care physician if you do not have one) for your post hospital discharge needs so that they can reassess your need for medications and monitor your lab values.     Type 2 Diabetes Mellitus, Diagnosis, Adult Type 2 diabetes (type 2 diabetes mellitus) is a long-term (chronic) disease. In type 2 diabetes, one or both of these problems may be present:  The pancreas does not make enough of a hormone called insulin.  Cells in the body do not respond properly to insulin that the body makes (insulin resistance).  Normally, insulin allows blood sugar (glucose) to enter cells in the body. The cells use glucose for energy. Insulin resistance or lack of insulin causes excess glucose to build up in the blood instead of going into cells. As a result, high blood glucose (hyperglycemia) develops. What increases the risk? The following factors may make you more likely to develop type 2 diabetes:  Having a family member with type 2 diabetes.  Being overweight or obese.  Having an inactive (sedentary) lifestyle.  Having been diagnosed with insulin resistance.  Having a history of prediabetes, gestational diabetes, or polycystic ovarian syndrome (PCOS).  Being of American-Indian, African-American, Hispanic/Latino, or Asian/Pacific Islander descent.  What are the signs or symptoms? In the early stage of this condition, you may not have symptoms. Symptoms develop slowly and may include:  Increased thirst (polydipsia).  Increased hunger(polyphagia).  Increased urination (polyuria).  Increased urination during the night (nocturia).  Unexplained weight loss.  Frequent infections that keep coming back  (recurring).  Fatigue.  Weakness.  Vision changes, such as blurry vision.  Cuts or bruises that are slow to heal.  Tingling or numbness in the hands or feet.  Dark patches on the skin (acanthosis nigricans).  How is this diagnosed?  This condition is diagnosed based on your symptoms, your medical history, a physical exam, and your blood glucose level. Your blood glucose may be checked with one or more of the following blood tests:  A fasting blood glucose (FBG) test. You will not be allowed to eat (you will fast) for at least 8 hours before a blood sample is taken.  A random blood glucose test. This checks blood glucose at any time of day regardless of when you ate.  An A1c (hemoglobin A1c) blood test. This provides information about blood glucose control over the previous 2-3 months.  An oral glucose tolerance test (OGTT). This measures your blood glucose at two times: ? After fasting. This is your baseline blood glucose level. ? Two hours after drinking a beverage that contains glucose.  You may be diagnosed with type 2 diabetes if:  Your FBG level is 126 mg/dL (7.0 mmol/L) or higher.  Your random blood glucose level is 200 mg/dL (11.1 mmol/L) or higher.  Your A1c level is 6.5% or higher.  Your OGGT result is higher than 200 mg/dL (11.1 mmol/L).  These blood tests may be repeated to confirm your diagnosis. How is this treated?  Your treatment may be managed by a specialist called an endocrinologist. Type 2 diabetes may be treated by following instructions from your health care provider about:  Making diet and lifestyle changes. This may include: ? Following an individualized nutrition plan that is developed by a diet and nutrition specialist (registered dietitian). ? Exercising regularly. ? Finding ways to manage stress.  Checking your blood glucose level as often as directed.  Taking diabetes medicines or insulin daily. This helps to keep your blood glucose levels  in the healthy range. ? If you use insulin, you may need to adjust the dosage depending on how physically active you are and what foods you eat. Your health care provider will tell you how to adjust your dosage.  Taking medicines to help prevent complications from diabetes, such as: ? Aspirin. ? Medicine to lower cholesterol. ? Medicine to control blood pressure.  Your health care provider will set individualized treatment goals for you. Your goals will be based on your age, other medical conditions you have, and how you respond to diabetes treatment. Generally, the goal of treatment is to maintain the following blood glucose levels:  Before meals (preprandial): 80-130 mg/dL (4.4-7.2 mmol/L).  After meals (postprandial): below 180 mg/dL (10 mmol/L).  A1c level: less than 7%.  Follow these instructions at home: Questions to [place] Provider Consider asking the following questions:  Do I need to meet with a diabetes educator?  Where can I find a support group for people with diabetes?  What equipment will I need to manage my diabetes at home?  What diabetes medicines do I need, and when should I take them?  How often do I need to check my blood glucose?  What number can I call if I have questions?  When is my next appointment?  General instructions  Take over-the-counter and prescription medicines only as told by your health care provider.  Keep all follow-up visits as told by your health care provider. This is important.  For more information about diabetes, visit: ? American Diabetes Association (ADA): www.diabetes.org ? American Association of Diabetes Educators (AADE): www.diabeteseducator.org/patient-resources Contact a health care provider if:  Your blood glucose is at or above 240 mg/dL (13.3 mmol/L) for 2 days in a row. ? You cannot eat or drink. ? You have nausea and vomiting. ? You have diarrhea. Get help right away if:  Your blood glucose is  lower than 54 mg/dL (3.0 mmol/L).  You become confused or you have trouble thinking clearly.  You have difficulty breathing.  You have moderate or large ketone levels in your urine. This information is not intended to replace advice given to you by your health care provider. Make sure you discuss any questions you have with your health care provider.  Document Released: 03/03/2005 Document Revised: 05You have been sick or have had a fever for 2 days or longer and you are not getting better.  You have any of the following problems for more than 6 hours: /25/2017 Document Reviewed: 04/06/2015 Elsevier Interactive Patient Education  2017 [employer].

## 2017-02-18 NOTE — Progress Notes (Signed)
Pt IV removed, WNL. D/C instructions given to pt. Verbalized understanding. Pt going to finish lunch and then family member at bedside will transport home.

## 2017-02-18 NOTE — Discharge Summary (Signed)
Physician Discharge Summary  [name] AQT:[id number] DOB: [birth year] DOA: 02/17/2017  PCP: Glenda Chroman, MD Cardiologist: Bronson Ing  Admit date: 02/17/2017 Discharge date: 02/18/2017  Admitted From: Home  Disposition: Home  Recommendations for Outpatient Follow-up:  1. Follow up with PCP in 1 weeks 2. Follow up with cardiology in 4 weeks 3. Please try to get her off some of her medications if possible.  4. Please obtain BMP/CBC in one week  Discharge Condition: STABLE   CODE STATUS: FULL    Brief Hospitalization Summary: Please see all hospital notes, images, labs for full details of the hospitalization.  HPI: [name] is a 66 y.o. female with medical history significant of OSA on CPAP; HTN; HLD; DM; COPD; CHF (grade 1 diastolic dysfunction in 56/25); and CAD s/p PCI presenting with CP.  For 2-3 nights she has been having left-sided chest pain and heaviness.  She took a NTG twice with relief.  This AM, she got up and took NTG about 930 because she knew she had a scheduled appt with Dr. Dorris Fetch.  In his office, she told him and he sent her to the ER.  She had been losing weight regularly, but she had gained 10 pounds without changing her eating habits.  +SOB, non-exertional.  No cough.  +diaphoresis.  Some LE edema.  She has been feeling very bloated.   Chest pain -Pt had 3 serial negative troponins and normal telemetry monitoring overnight.   - Cardiology was consulted and added imdur 30 mg daily but no further testing was recommended as she had a normal stress test in June 2018.   They plan to see her in 4-6 weeks for outpatient follow up.   HTN -Takes low-dose Toprol monotherapy at home -Imdur 30 mg daily added by cardiology service.   HLD -Continue Lipitor -follow up outpatient for further mgmt.   DM -Last A1c was 7.8 last week - resume home medications and follow up with PCP.    OSA -Continue CPAP -resume home CPAP at discharge   Anxiety and depression  with polypharmacy -She is on an extensive list of psychiatric medications, which will be continued, to include: Wellbutrin; Celexa; Klonopin; Cymbalta; Neurontin; Topamax; and Trazodone.  I am concerned about the amount of meds she is on.  Please work with her outpatient to reduce some of these medications if possible.  -Also with chronic pain, on Robaxin, MSContin -I have reviewed this patient in the [place] Controlled Substances Reporting System.  She is generally receiving medications from only one provider and appears to be taking them as prescribed. -She is at increased risk of opioid misuse, diversion, or overdose.  DVT prophylaxis: Lovenox Code Status:  Full - confirmed with patient Disposition Plan:  Home once clinically improved Consults called: Cardiology   Discharge Diagnoses:  Principal Problem:   Chest pain Active Problems:   Mixed hyperlipidemia   OBSTRUCTIVE SLEEP APNEA   Chronic diastolic heart failure (HCC)   Type 2 diabetes mellitus with vascular disease (HCC)   Anxiety and depression   Morbid obesity due to excess calories (HCC)   Essential hypertension, benign  Discharge Instructions: Discharge Instructions    Call MD for:  difficulty breathing, headache or visual disturbances   Complete by:  As directed    Call MD for:  extreme fatigue   Complete by:  As directed    Call MD for:  persistant dizziness or light-headedness   Complete by:  As directed    Call MD for:  persistant nausea and vomiting   Complete by:  As directed    Call MD for:  severe uncontrolled pain   Complete by:  As directed    Diet - low sodium heart healthy   Complete by:  As directed    Increase activity slowly   Complete by:  As directed      Allergies as of 02/18/2017      Reactions   Nitrofuran Derivatives Itching, Swelling   Amphetamine-dextroamphet Er Swelling   Pregabalin Swelling   Topiramate Other (See Comments)   Tongue tingle   Verelan [verapamil] Rash      Medication  List    STOP taking these medications   ibuprofen 200 MG tablet Commonly known as:  ADVIL,MOTRIN     TAKE these medications   ACCU-CHEK AVIVA PLUS test strip Generic drug:  glucose blood USE TO CHECK BLOOD SUGAR THREE TIMES DAILY - DX: E11.9   aspirin EC 81 MG tablet Take 81 mg by mouth daily.   atorvastatin 40 MG tablet Commonly known as:  LIPITOR Take 40 mg by mouth daily.   beclomethasone 40 MCG/ACT inhaler Commonly known as:  QVAR Inhale 1 puff into the lungs 2 (two) times daily.   buPROPion 100 MG 12 hr tablet Commonly known as:  WELLBUTRIN SR Take 100 mg by mouth daily.   citalopram 40 MG tablet Commonly known as:  CELEXA TAKE ONE TABLET BY MOUTH EVERY EVENING (CORRECTED DOSE)   clonazePAM 0.5 MG tablet Commonly known as:  KLONOPIN Take 1 tablet (0.5 mg total) by mouth 2 (two) times daily as needed for anxiety. What changed:  when to take this   diclofenac sodium 1 % Gel Commonly known as:  VOLTAREN APPLY TO BOTH KNEES THREE TIMES DAILY   DULoxetine 30 MG capsule Commonly known as:  CYMBALTA Take 1 capsule (30 mg total) by mouth daily.   DUREZOL 0.05 % Emul Generic drug:  Difluprednate INSTILL ONE DROP INTO THE RIGHT EYE THREE TIMES DAILY, TAPER OVER THREE WEEKS   estrogens (conjugated) 1.25 MG tablet Commonly known as:  PREMARIN Take 1.25 mg by mouth daily.   fluticasone-salmeterol 115-21 MCG/ACT inhaler Commonly known as:  ADVAIR HFA Inhale 2 puffs into the lungs 2 (two) times daily.   gabapentin 400 MG capsule Commonly known as:  NEURONTIN Take 400 mg by mouth 3 (three) times daily.   GLOBAL EASE INJECT PEN NEEDLES 31G X 8 MM Misc Generic drug:  Insulin Pen Needle USE AS DIRECTED FOUR TIMES DAILY   isosorbide mononitrate 30 MG 24 hr tablet Commonly known as:  IMDUR Take 1 tablet (30 mg total) by mouth daily. Start taking on:  02/19/2017   LANTUS SOLOSTAR 100 UNIT/ML Solostar Pen Generic drug:  Insulin Glargine INJECT 50 UNITS INTO THE  SKIN AT BEDTIME.   lidocaine 5 % ointment Commonly known as:  XYLOCAINE Apply 1 application topically at bedtime.   meloxicam 7.5 MG tablet Commonly known as:  MOBIC Take 1 tablet (7.5 mg total) by mouth daily. What changed:  when to take this   metFORMIN 500 MG tablet Commonly known as:  GLUCOPHAGE TAKE ONE TABLET BY MOUTH TWICE DAILY with a meal   methocarbamol 500 MG tablet Commonly known as:  ROBAXIN Take 1 tablet (500 mg total) by mouth 3 (three) times daily.   metoprolol succinate 50 MG 24 hr tablet Commonly known as:  TOPROL-XL TAKE 1 AND 1/2 TABLETS DAILY 75MG   montelukast 10 MG tablet Commonly known as:  SINGULAIR  Take 10 mg by mouth daily.   morphine 30 MG 12 hr tablet Commonly known as:  MS CONTIN Take 1 tablet (30 mg total) by mouth every 8 (eight) hours.   multivitamin with minerals Tabs tablet Take 1 tablet by mouth daily.   nitroGLYCERIN 0.4 MG SL tablet Commonly known as:  NITROSTAT Place 1 tablet (0.4 mg total) under the tongue every 5 (five) minutes x 3 doses as needed. For chest pains   ondansetron 4 MG tablet Commonly known as:  ZOFRAN Take 4 mg by mouth every 6 (six) hours as needed for nausea or vomiting.   pantoprazole 40 MG tablet Commonly known as:  PROTONIX TAKE ONE TABLET BY MOUTH TWICE DAILY BEFORE MEALS   potassium chloride 10 MEQ tablet Commonly known as:  K-DUR,KLOR-CON Take 10 mEq by mouth 2 (two) times daily.   PROAIR HFA 108 (90 Base) MCG/ACT inhaler Generic drug:  albuterol inhale TWO puffs FOUR TIMES DAILY AS NEEDED   promethazine 25 MG tablet Commonly known as:  PHENERGAN Take 25 mg by mouth 4 (four) times daily as needed.   psyllium 28 % packet Commonly known as:  METAMUCIL SMOOTH TEXTURE Take 1 packet by mouth at bedtime.   ranitidine 300 MG tablet Commonly known as:  ZANTAC Take 1 tablet (300 mg total) by mouth at bedtime as needed for heartburn. What changed:    when to take this  reasons to take this    topiramate 200 MG tablet Commonly known as:  TOPAMAX Take 200 mg by mouth at bedtime.   traZODone 50 MG tablet Commonly known as:  DESYREL Take 50 mg by mouth at bedtime.      Follow-up Information    [name], Dhruv B, MD. Schedule an appointment as soon as possible for a visit in 1 week(s).   Specialty:  Internal Medicine Contact information: [place] [address] [phone]        Herminio Commons, MD. Schedule an appointment as soon as possible for a visit in 4 week(s).   Specialty:  Cardiology Contact information: [place] [address] [phone]          Allergies  Allergen Reactions  . Nitrofuran Derivatives Itching and Swelling  . Amphetamine-Dextroamphet Er Swelling  . Pregabalin Swelling  . Topiramate Other (See Comments)    Tongue tingle  . Verelan [Verapamil] Rash   Allergies as of 02/18/2017      Reactions   Nitrofuran Derivatives Itching, Swelling   Amphetamine-dextroamphet Er Swelling   Pregabalin Swelling   Topiramate Other (See Comments)   Tongue tingle   Verelan [verapamil] Rash      Medication List    STOP taking these medications   ibuprofen 200 MG tablet Commonly known as:  ADVIL,MOTRIN     TAKE these medications   ACCU-CHEK AVIVA PLUS test strip Generic drug:  glucose blood USE TO CHECK BLOOD SUGAR THREE TIMES DAILY - DX: E11.9   aspirin EC 81 MG tablet Take 81 mg by mouth daily.   atorvastatin 40 MG tablet Commonly known as:  LIPITOR Take 40 mg by mouth daily.   beclomethasone 40 MCG/ACT inhaler Commonly known as:  QVAR Inhale 1 puff into the lungs 2 (two) times daily.   buPROPion 100 MG 12 hr tablet Commonly known as:  WELLBUTRIN SR Take 100 mg by mouth daily.   citalopram 40 MG tablet Commonly known as:  CELEXA TAKE ONE TABLET BY MOUTH EVERY EVENING (CORRECTED DOSE)  clonazePAM 0.5 MG tablet Commonly known as:  KLONOPIN Take 1 tablet (0.5 mg total) by mouth 2 (two) times daily as  needed for anxiety. What changed:  when to take this   diclofenac sodium 1 % Gel Commonly known as:  VOLTAREN APPLY TO BOTH KNEES THREE TIMES DAILY   DULoxetine 30 MG capsule Commonly known as:  CYMBALTA Take 1 capsule (30 mg total) by mouth daily.   DUREZOL 0.05 % Emul Generic drug:  Difluprednate INSTILL ONE DROP INTO THE RIGHT EYE THREE TIMES DAILY, TAPER OVER THREE WEEKS   estrogens (conjugated) 1.25 MG tablet Commonly known as:  PREMARIN Take 1.25 mg by mouth daily.   fluticasone-salmeterol 115-21 MCG/ACT inhaler Commonly known as:  ADVAIR HFA Inhale 2 puffs into the lungs 2 (two) times daily.   gabapentin 400 MG capsule Commonly known as:  NEURONTIN Take 400 mg by mouth 3 (three) times daily.   GLOBAL EASE INJECT PEN NEEDLES 31G X 8 MM Misc Generic drug:  Insulin Pen Needle USE AS DIRECTED FOUR TIMES DAILY   isosorbide mononitrate 30 MG 24 hr tablet Commonly known as:  IMDUR Take 1 tablet (30 mg total) by mouth daily. Start taking on:  02/19/2017   LANTUS SOLOSTAR 100 UNIT/ML Solostar Pen Generic drug:  Insulin Glargine INJECT 50 UNITS INTO THE SKIN AT BEDTIME.   lidocaine 5 % ointment Commonly known as:  XYLOCAINE Apply 1 application topically at bedtime.   meloxicam 7.5 MG tablet Commonly known as:  MOBIC Take 1 tablet (7.5 mg total) by mouth daily. What changed:  when to take this   metFORMIN 500 MG tablet Commonly known as:  GLUCOPHAGE TAKE ONE TABLET BY MOUTH TWICE DAILY with a meal   methocarbamol 500 MG tablet Commonly known as:  ROBAXIN Take 1 tablet (500 mg total) by mouth 3 (three) times daily.   metoprolol succinate 50 MG 24 hr tablet Commonly known as:  TOPROL-XL TAKE 1 AND 1/2 TABLETS DAILY 75MG   montelukast 10 MG tablet Commonly known as:  SINGULAIR Take 10 mg by mouth daily.   morphine 30 MG 12 hr tablet Commonly known as:  MS CONTIN Take 1 tablet (30 mg total) by mouth every 8 (eight) hours.   multivitamin with minerals Tabs  tablet Take 1 tablet by mouth daily.   nitroGLYCERIN 0.4 MG SL tablet Commonly known as:  NITROSTAT Place 1 tablet (0.4 mg total) under the tongue every 5 (five) minutes x 3 doses as needed. For chest pains   ondansetron 4 MG tablet Commonly known as:  ZOFRAN Take 4 mg by mouth every 6 (six) hours as needed for nausea or vomiting.   pantoprazole 40 MG tablet Commonly known as:  PROTONIX TAKE ONE TABLET BY MOUTH TWICE DAILY BEFORE MEALS   potassium chloride 10 MEQ tablet Commonly known as:  K-DUR,KLOR-CON Take 10 mEq by mouth 2 (two) times daily.   PROAIR HFA 108 (90 Base) MCG/ACT inhaler Generic drug:  albuterol inhale TWO puffs FOUR TIMES DAILY AS NEEDED   promethazine 25 MG tablet Commonly known as:  PHENERGAN Take 25 mg by mouth 4 (four) times daily as needed.   psyllium 28 % packet Commonly known as:  METAMUCIL SMOOTH TEXTURE Take 1 packet by mouth at bedtime.   ranitidine 300 MG tablet Commonly known as:  ZANTAC Take 1 tablet (300 mg total) by mouth at bedtime as needed for heartburn. What changed:    when to take this  reasons to take this   topiramate  200 MG tablet Commonly known as:  TOPAMAX Take 200 mg by mouth at bedtime.   traZODone 50 MG tablet Commonly known as:  DESYREL Take 50 mg by mouth at bedtime.       Procedures/Studies: Dg Chest Port 1 View  Result Date: 02/17/2017 CLINICAL DATA:  Chest pain intermittently for a few days, worse today. Some shortness of breath. EXAM: PORTABLE CHEST 1 VIEW COMPARISON:  Thoracic spine radiographs 10/16/2014. Chest radiographs 02/25/2014. FINDINGS: The cardiac silhouette remains mildly enlarged. The patient has taken a shallower inspiration than on the prior chest radiographs. No confluent airspace opacity, edema, sizable pleural effusion, or pneumothorax is identified. No acute osseous abnormality is seen. IMPRESSION: Cardiomegaly without evidence of acute cardiopulmonary process. Electronically Signed   By:  Logan Bores M.D.   On: 02/17/2017 14:15      Subjective: Pt says that her chest pain is intermittent and mostly with coughing and sneezing.   No SOB.    Discharge Exam: Vitals:   02/18/17 0500 02/18/17 0804  BP: (!) 103/43   Pulse: (!) 57   Resp: 16   Temp: (!) 97 F (36.1 C)   SpO2: 91% 96%   Vitals:   02/17/17 1955 02/17/17 2000 02/18/17 0500 02/18/17 0804  BP:  (!) 135/54 (!) 103/43   Pulse:  73 (!) 57   Resp:  18 16   Temp:  (!) 94.7 F (34.8 C) (!) 97 F (36.1 C)   TempSrc:  Oral Oral   SpO2: 97% 97% 91% 96%  Weight:      Height:         General:  Appears calm and comfortable and is NAD  Eyes:  PERRL, EOMI, normal lids, iris  ENT:  grossly normal hearing, lips & tongue, mmm; missing mid-lower dentition  Neck:  no LAD, masses or thyromegaly; no carotid bruits  Cardiovascular:  RRR, no m/r/g. No LE edema.   Respiratory:   CTA bilaterally with no wheezes/rales/rhonchi.  Normal respiratory effort.  Abdomen:  soft, NT, ND  Skin:  no rash or induration seen on limited exam  Musculoskeletal:  grossly normal tone BUE/BLE, good ROM, no bony abnormality  Lower extremity:  No LE edema.  Limited foot exam with no ulcerations.  2+ distal pulses.  Psychiatric:  grossly normal mood and affect, speech fluent and appropriate, AOx3  Neurologic:  CN 2-12 grossly intact, moves all extremities in coordinated fashion, sensation intact   The results of significant diagnostics from this hospitalization (including imaging, microbiology, ancillary and laboratory) are listed below for reference.     Microbiology: No results found for this or any previous visit (from the past 240 hour(s)).   Labs: BNP (last 3 results) Recent Labs    02/17/17 1409  BNP 41.3   Basic Metabolic Panel: Recent Labs  Lab 02/17/17 1409  NA 137  K 3.9  CL 106  CO2 24  GLUCOSE 207*  BUN 14  CREATININE 0.69  CALCIUM 9.3   Liver Function Tests: No results for input(s): AST, ALT,  ALKPHOS, BILITOT, PROT, ALBUMIN in the last 168 hours. No results for input(s): LIPASE, AMYLASE in the last 168 hours. No results for input(s): AMMONIA in the last 168 hours. CBC: Recent Labs  Lab 02/17/17 1409  WBC 7.8  NEUTROABS 4.4  HGB 13.5  HCT 43.7  MCV 89.7  PLT 359   Cardiac Enzymes: Recent Labs  Lab 02/17/17 1409 02/17/17 1718 02/17/17 2300 02/18/17 0528  TROPONINI <0.03 <0.03 <0.03 <0.03  BNP: Invalid input(s): POCBNP CBG: Recent Labs  Lab 02/17/17 2144 02/18/17 0728  GLUCAP 145* 115*   D-Dimer No results for input(s): DDIMER in the last 72 hours. Hgb A1c No results for input(s): HGBA1C in the last 72 hours. Lipid Profile Recent Labs    02/18/17 0528  CHOL 151  HDL 46  LDLCALC UNABLE TO CALCULATE IF TRIGLYCERIDE OVER 400 mg/dL  TRIG 439*  CHOLHDL 3.3   Thyroid function studies No results for input(s): TSH, T4TOTAL, T3FREE, THYROIDAB in the last 72 hours.  Invalid input(s): FREET3 Anemia work up No results for input(s): VITAMINB12, FOLATE, FERRITIN, TIBC, IRON, RETICCTPCT in the last 72 hours. Urinalysis    Component Value Date/Time   COLORURINE YELLOW 12/02/2012 1520   APPEARANCEUR CLEAR 12/02/2012 1520   LABSPEC 1.020 12/02/2012 1520   PHURINE 5.5 12/02/2012 1520   GLUCOSEU NEGATIVE 12/02/2012 1520   HGBUR TRACE (A) 12/02/2012 1520   BILIRUBINUR NEGATIVE 12/02/2012 1520   KETONESUR NEGATIVE 12/02/2012 1520   PROTEINUR NEGATIVE 12/02/2012 1520   UROBILINOGEN 0.2 12/02/2012 1520   NITRITE NEGATIVE 12/02/2012 1520   LEUKOCYTESUR NEGATIVE 12/02/2012 1520   Sepsis Labs Invalid input(s): PROCALCITONIN,  WBC,  LACTICIDVEN Microbiology No results found for this or any previous visit (from the past 240 hour(s)).  Time coordinating discharge:   SIGNED:  Irwin Brakeman, MD  [facility] 02/18/2017, 10:57 AM Pager [phone]  If 7PM-7AM, please contact night-coverage [email or web address] Password [email or web address]

## 2017-02-18 NOTE — Plan of Care (Signed)
Pt alert and oriented x4. Able to make needs known. No complaint chest  pain only back pain and no other distress at this time. Up with one person assist. 20G to left AC saline locked. Call light within reach, bed in lowest position. Will continue to monitor.

## 2017-02-19 ENCOUNTER — Encounter: Payer: Self-pay | Admitting: Registered Nurse

## 2017-02-19 ENCOUNTER — Telehealth: Payer: Self-pay | Admitting: *Deleted

## 2017-02-19 ENCOUNTER — Encounter: Payer: Medicaid Other | Attending: Physical Medicine & Rehabilitation | Admitting: Registered Nurse

## 2017-02-19 VITALS — BP 128/74 | HR 64 | Resp 14

## 2017-02-19 DIAGNOSIS — Z5181 Encounter for therapeutic drug level monitoring: Secondary | ICD-10-CM | POA: Diagnosis not present

## 2017-02-19 DIAGNOSIS — I252 Old myocardial infarction: Secondary | ICD-10-CM | POA: Insufficient documentation

## 2017-02-19 DIAGNOSIS — E1142 Type 2 diabetes mellitus with diabetic polyneuropathy: Secondary | ICD-10-CM | POA: Diagnosis not present

## 2017-02-19 DIAGNOSIS — G8929 Other chronic pain: Secondary | ICD-10-CM | POA: Insufficient documentation

## 2017-02-19 DIAGNOSIS — M5416 Radiculopathy, lumbar region: Secondary | ICD-10-CM

## 2017-02-19 DIAGNOSIS — R079 Chest pain, unspecified: Secondary | ICD-10-CM | POA: Insufficient documentation

## 2017-02-19 DIAGNOSIS — M47896 Other spondylosis, lumbar region: Secondary | ICD-10-CM | POA: Insufficient documentation

## 2017-02-19 DIAGNOSIS — M25562 Pain in left knee: Secondary | ICD-10-CM | POA: Diagnosis not present

## 2017-02-19 DIAGNOSIS — Z760 Encounter for issue of repeat prescription: Secondary | ICD-10-CM | POA: Insufficient documentation

## 2017-02-19 DIAGNOSIS — J449 Chronic obstructive pulmonary disease, unspecified: Secondary | ICD-10-CM | POA: Diagnosis not present

## 2017-02-19 DIAGNOSIS — F418 Other specified anxiety disorders: Secondary | ICD-10-CM | POA: Diagnosis not present

## 2017-02-19 DIAGNOSIS — Z79899 Other long term (current) drug therapy: Secondary | ICD-10-CM

## 2017-02-19 DIAGNOSIS — R000 Tachycardia, unspecified: Secondary | ICD-10-CM | POA: Insufficient documentation

## 2017-02-19 DIAGNOSIS — M25561 Pain in right knee: Secondary | ICD-10-CM | POA: Diagnosis not present

## 2017-02-19 DIAGNOSIS — G4733 Obstructive sleep apnea (adult) (pediatric): Secondary | ICD-10-CM | POA: Diagnosis not present

## 2017-02-19 DIAGNOSIS — M5489 Other dorsalgia: Secondary | ICD-10-CM | POA: Diagnosis not present

## 2017-02-19 DIAGNOSIS — I509 Heart failure, unspecified: Secondary | ICD-10-CM | POA: Insufficient documentation

## 2017-02-19 DIAGNOSIS — I2510 Atherosclerotic heart disease of native coronary artery without angina pectoris: Secondary | ICD-10-CM | POA: Diagnosis not present

## 2017-02-19 DIAGNOSIS — E785 Hyperlipidemia, unspecified: Secondary | ICD-10-CM | POA: Insufficient documentation

## 2017-02-19 DIAGNOSIS — M7061 Trochanteric bursitis, right hip: Secondary | ICD-10-CM | POA: Diagnosis not present

## 2017-02-19 DIAGNOSIS — M79605 Pain in left leg: Secondary | ICD-10-CM | POA: Insufficient documentation

## 2017-02-19 DIAGNOSIS — Z1401 Asymptomatic hemophilia A carrier: Secondary | ICD-10-CM | POA: Diagnosis not present

## 2017-02-19 DIAGNOSIS — K219 Gastro-esophageal reflux disease without esophagitis: Secondary | ICD-10-CM | POA: Insufficient documentation

## 2017-02-19 DIAGNOSIS — G894 Chronic pain syndrome: Secondary | ICD-10-CM

## 2017-02-19 DIAGNOSIS — M1990 Unspecified osteoarthritis, unspecified site: Secondary | ICD-10-CM | POA: Diagnosis not present

## 2017-02-19 DIAGNOSIS — R51 Headache: Secondary | ICD-10-CM | POA: Insufficient documentation

## 2017-02-19 DIAGNOSIS — F411 Generalized anxiety disorder: Secondary | ICD-10-CM

## 2017-02-19 DIAGNOSIS — F3289 Other specified depressive episodes: Secondary | ICD-10-CM | POA: Diagnosis not present

## 2017-02-19 DIAGNOSIS — Z794 Long term (current) use of insulin: Secondary | ICD-10-CM | POA: Insufficient documentation

## 2017-02-19 DIAGNOSIS — G4700 Insomnia, unspecified: Secondary | ICD-10-CM | POA: Insufficient documentation

## 2017-02-19 DIAGNOSIS — M79604 Pain in right leg: Secondary | ICD-10-CM | POA: Insufficient documentation

## 2017-02-19 DIAGNOSIS — R002 Palpitations: Secondary | ICD-10-CM | POA: Diagnosis not present

## 2017-02-19 MED ORDER — CLONAZEPAM 0.5 MG PO TABS: 0.5000 mg | ORAL_TABLET | Freq: Two times a day (BID) | ORAL | 2 refills | Status: DC | PRN

## 2017-02-19 MED ORDER — MORPHINE SULFATE ER 30 MG PO TBCR: 30.0000 mg | EXTENDED_RELEASE_TABLET | Freq: Three times a day (TID) | ORAL | 0 refills | Status: DC

## 2017-02-19 NOTE — Progress Notes (Signed)
Subjective:    Patient ID: [name], female    DOB: Oct 22, 1950, 66 y.o.   MRN: [id number]  HPI: [name] is a 66year old female who returns for follow up appointmentfor chronic pain and medication refill. She states her pain is located in her lowerback radiating into herright lower extremity, bilateral hips, bilateral knees and bilateral feet. She rates her pain 8. Her current exercise regime is performing chair exercises and walking short distances with her walker.   [name] was admitted to [facility] for Chest Pain on 02/17/2017, notes reviewed.   [name] Morphine equivalent is 90 MME.  She is also prescribed Klonopin .We have reviewed the black box warning of using opioids and benzodiazepines. I highlighted the dangers of using these drugs together and discussed the adverse events including respiratory suppression, overdose, cognitive impairment and importance of  compliance with current regimen. She verbalizes understanding, we will continue to monitor and adjust as indicated.     UDS was performed on 01/28/2017 it was consistent.   Pain Inventory Average Pain 7 Pain Right Now 8 My pain is sharp, burning, dull, stabbing, tingling and aching  In the last 24 hours, has pain interfered with the following? General activity 9 Relation with others 8 Enjoyment of life 9 What TIME of day is your pain at its worst? morning and night Sleep (in general) Poor  Pain is worse with: walking, bending, sitting, inactivity, standing and some activites Pain improves with: heat/ice and medication Relief from Meds: 2  Mobility walk with assistance use a walker how many minutes can you walk? 5 ability to climb steps?  yes do you drive?  yes needs help with transfers Do you have any goals in this area?  no  Function disabled: date disabled . I need assistance with the following:  dressing, bathing, toileting, meal prep, household duties and shopping Do you have any  goals in this area?  no  Neuro/Psych bladder control problems weakness numbness tingling trouble walking spasms dizziness confusion depression anxiety  Prior Studies Any changes since last visit?  no  Physicians involved in your care Any changes since last visit?  no   Family History  Problem Relation Age of Onset  . Heart attack Mother   . Colon cancer Mother   . Heart attack Father   . Stroke Sister   . Hemophilia Child   . Cancer Unknown   . Coronary artery disease Unknown   . Cancer Brother   . Cancer Maternal Aunt   . Cancer Maternal Uncle   . Cancer Paternal Aunt   . Cancer Paternal Uncle   . Cancer Brother    Social History   Socioeconomic History  . Marital status: Legally Separated    Spouse name: None  . Number of children: 4  . Years of education: None  . Highest education level: None  Social Needs  . Financial resource strain: None  . Food insecurity - worry: None  . Food insecurity - inability: None  . Transportation needs - medical: None  . Transportation needs - non-medical: None  Occupational History  . Occupation: Disabled  Tobacco Use  . Smoking status: Never Smoker  . Smokeless tobacco: Never Used  Substance and Sexual Activity  . Alcohol use: No    Alcohol/week: 0.0 oz  . Drug use: No  . Sexual activity: None  Other Topics Concern  . None  Social History Narrative   Patient does not get regular  exercise   Denies caffeine use    Past Surgical History:  Procedure Laterality Date  . ABDOMINAL HYSTERECTOMY    . Arm Surgery Bilateral    due to fall-pt broke both forearms  . BIOPSY N/A 07/01/2013   Procedure: BIOPSY;  Surgeon: Rogene Houston, MD;  Location: [facility];  Service: Endoscopy;  Laterality: N/A;  . CHOLECYSTECTOMY    . COLONOSCOPY  08/06/2011   Procedure: COLONOSCOPY;  Surgeon: Rogene Houston, MD;  [facility];  Service: Endoscopy;  Laterality: N/A;  215  . COLONOSCOPY WITH PROPOFOL N/A 12/31/2012    Procedure: COLONOSCOPY WITH PROPOFOL;  Surgeon: Rogene Houston, MD;  Location: [facility];  Service: Endoscopy;  Laterality: N/A;  in cecum at 0814, total withdrawal time 50mn  . CORONARY ANGIOPLASTY WITH STENT PLACEMENT    . ESOPHAGEAL DILATION N/A 06/07/2015   Procedure: ESOPHAGEAL DILATION;  Surgeon: NRogene Houston MD;  [facility];  Service: Endoscopy;  Laterality: N/A;  . ESOPHAGOGASTRODUODENOSCOPY N/A 06/07/2015   Procedure: ESOPHAGOGASTRODUODENOSCOPY (EGD);  Surgeon: NRogene Houston MD;  [facility];  Service: Endoscopy;  Laterality: N/A;  2:45 - moved to 1:00 - Ann to notify pt  . ESOPHAGOGASTRODUODENOSCOPY (EGD) WITH PROPOFOL N/A 12/31/2012   Procedure: ESOPHAGOGASTRODUODENOSCOPY (EGD) WITH PROPOFOL;  Surgeon: NRogene Houston MD;  [facility];  Service: Endoscopy;  Laterality: N/A;  GE junction 37,  . ESOPHAGOGASTRODUODENOSCOPY (EGD) WITH PROPOFOL N/A 07/01/2013   Procedure: ESOPHAGOGASTRODUODENOSCOPY (EGD) WITH PROPOFOL;  Surgeon: NRogene Houston MD;  [facility];  Service: Endoscopy;  Laterality: N/A;  950  . MALONEY DILATION N/A 12/31/2012   Procedure: MALONEY DILATION;  Surgeon: NRogene Houston MD;  [facility];  Service: Endoscopy;  Laterality: N/A;  used a # 54,#56  . MALONEY DILATION N/A 07/01/2013   Procedure: MVenia MinksDILATION;  Surgeon: NRogene Houston MD;  [facility]: [facility];  Service: Endoscopy;  Laterality: N/A;  54/58; no heme present   Past Medical History:  Diagnosis Date  . Anxiety and depression   . Arthritis   . CAD (coronary artery disease)    Stent placement circumflex coronary 2007, catheterization 2008 patent stents. Normal LV function  . Chest pain   . CHF (congestive heart failure) ([facility]   . COPD (chronic obstructive pulmonary disease) (H[facility])    no home O2  . Depression   . Diabetes mellitus    Insulin dependent  . Diabetic polyneuropathy (HCC)     severe on multiple medications  . Dyslipidemia   . GERD  (gastroesophageal reflux disease)   . Headache(784.0)   . 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 Hypertension   . [place]    [place]  . Obstructive sleep apnea    CPAP, setting ?2  . Palpitations   . Tachycardia    BP 128/74 (BP Location: Left Arm, Patient Position: Sitting, Cuff Size: Normal)   Pulse 64   Resp 14   SpO2 92%   Opioid Risk Score:  1 Fall Risk Score:  `1  Depression screen PHQ 2/9  [facility]17[facility]2017 01/14/2017 12/24/2016 11/11/2016 10/28/2016 08/07/2016 05/14/2016  Decreased Interest 0 3 0 0 2 0 0  Down, Depressed, Hopeless 0 3 0 0 - 0 0  PHQ - 2 Score 0 6 0 0 2 0 0  Altered sleeping - - - - - - -  Tired, decreased energy - - - - - - -  Change in appetite - - - - - - -  Feeling bad or failure about yourself  - - - - - - -  Trouble concentrating - - - - - - -  Moving slowly or fidgety/restless - - - - - - -  Suicidal thoughts - - - - - - -  PHQ-9 Score - - - - - - -  Some recent data might be hidden   Review of Systems  Constitutional: Positive for diaphoresis and unexpected weight change.  HENT: Negative.   Eyes: Negative.   Respiratory: Positive for apnea, shortness of breath and wheezing.   Gastrointestinal: Positive for nausea and vomiting.  Endocrine: Negative.        High and low blood sugar  Genitourinary: Positive for dysuria.       Bladder control  Musculoskeletal: Positive for arthralgias, back pain and gait problem.       Spasms  Allergic/Immunologic: Negative.   Neurological: Positive for dizziness, weakness and numbness.       Tingling  Hematological: Negative.   Psychiatric/Behavioral: Positive for confusion and dysphoric mood. The patient is nervous/anxious.   All other systems reviewed and are negative.      Objective:   Physical Exam  Constitutional: She is oriented to person, place, and time. She appears well-developed and well-nourished.  HENT:  Head: Normocephalic and atraumatic.  Neck: Normal range  of motion. Neck supple.  Cardiovascular: Normal rate and regular rhythm.  Pulmonary/Chest: Effort normal and breath sounds normal.  Musculoskeletal:  Normal Muscle Bulk and Muscle Testing Reveals:  Upper Extremities: Full  ROM and Muscle Strength 5/5 Thoracic Paraspinal Tenderness: T-1-T-3 Lumbar Paraspinal Tenderness: L-3-L-5 Bilateral Greater Trochanter Tenderness Lower Extremities: Full  ROM and Muscle Strength 5/5 Arises from Table slowly using walker for support Narrow Based Gait  Neurological: She is alert and oriented to person, place, and time.  Skin: Skin is warm and dry.  Psychiatric: She has a normal mood and affect.  Nursing note and vitals reviewed.         Assessment & Plan:  1.Lumbar pain lumbar spondylosis: Encouraged to increase Activity as tolerated. 02/19/2017 Refilled: MS Contin 30 MG one tablet every 8 hours #90. We will continue the opioid monitoring program, this consists of regular clinic visits, examinations, urine drug screen, pill counts as well as use of [place] Controlled Substance Reporting System. 2. Lumbar Radiculitis: Continue Gabapentin. 02/19/2017 3. Bilateral Knee Pain/ Degenerative: Continue Voltaren Gel. Continue to Monitor. 02/19/2017. 4. Severe diabetic poly neuropathy: Continue Gabapentin. 02/19/2017 5. Insomnia: ContinueTrazodone PCP Following. 02/19/2017 6. Anxiety/ Depression: Continue Cymbalta and Klonopin . 02/19/2017  20 minutes of face to face patient care time was spent during this visit. All questions were encouraged and answered.  F/U in 1 month

## 2017-02-19 NOTE — Telephone Encounter (Signed)
[name] needs an appt with [name]. Please call her to schedule.

## 2017-02-24 ENCOUNTER — Ambulatory Visit: Payer: Medicaid Other | Admitting: Registered Nurse

## 2017-03-07 ENCOUNTER — Other Ambulatory Visit: Payer: Self-pay | Admitting: "Endocrinology

## 2017-03-15 ENCOUNTER — Other Ambulatory Visit: Payer: Self-pay | Admitting: Adult Health

## 2017-03-19 ENCOUNTER — Encounter: Payer: Self-pay | Admitting: Registered Nurse

## 2017-03-19 ENCOUNTER — Encounter: Payer: Medicaid Other | Attending: Physical Medicine & Rehabilitation | Admitting: Registered Nurse

## 2017-03-19 ENCOUNTER — Other Ambulatory Visit: Payer: Self-pay | Admitting: Registered Nurse

## 2017-03-19 VITALS — BP 115/67 | HR 60

## 2017-03-19 DIAGNOSIS — M25561 Pain in right knee: Secondary | ICD-10-CM | POA: Diagnosis not present

## 2017-03-19 DIAGNOSIS — M5489 Other dorsalgia: Secondary | ICD-10-CM | POA: Diagnosis not present

## 2017-03-19 DIAGNOSIS — M25531 Pain in right wrist: Secondary | ICD-10-CM

## 2017-03-19 DIAGNOSIS — M7061 Trochanteric bursitis, right hip: Secondary | ICD-10-CM

## 2017-03-19 DIAGNOSIS — M79605 Pain in left leg: Secondary | ICD-10-CM | POA: Insufficient documentation

## 2017-03-19 DIAGNOSIS — R002 Palpitations: Secondary | ICD-10-CM | POA: Insufficient documentation

## 2017-03-19 DIAGNOSIS — M25512 Pain in left shoulder: Secondary | ICD-10-CM

## 2017-03-19 DIAGNOSIS — K219 Gastro-esophageal reflux disease without esophagitis: Secondary | ICD-10-CM | POA: Diagnosis not present

## 2017-03-19 DIAGNOSIS — R51 Headache: Secondary | ICD-10-CM | POA: Insufficient documentation

## 2017-03-19 DIAGNOSIS — M7062 Trochanteric bursitis, left hip: Secondary | ICD-10-CM

## 2017-03-19 DIAGNOSIS — E1142 Type 2 diabetes mellitus with diabetic polyneuropathy: Secondary | ICD-10-CM

## 2017-03-19 DIAGNOSIS — I509 Heart failure, unspecified: Secondary | ICD-10-CM | POA: Insufficient documentation

## 2017-03-19 DIAGNOSIS — F418 Other specified anxiety disorders: Secondary | ICD-10-CM | POA: Diagnosis not present

## 2017-03-19 DIAGNOSIS — M79604 Pain in right leg: Secondary | ICD-10-CM | POA: Diagnosis not present

## 2017-03-19 DIAGNOSIS — Z5181 Encounter for therapeutic drug level monitoring: Secondary | ICD-10-CM

## 2017-03-19 DIAGNOSIS — F411 Generalized anxiety disorder: Secondary | ICD-10-CM

## 2017-03-19 DIAGNOSIS — M1990 Unspecified osteoarthritis, unspecified site: Secondary | ICD-10-CM | POA: Insufficient documentation

## 2017-03-19 DIAGNOSIS — I2510 Atherosclerotic heart disease of native coronary artery without angina pectoris: Secondary | ICD-10-CM | POA: Diagnosis not present

## 2017-03-19 DIAGNOSIS — M654 Radial styloid tenosynovitis [de Quervain]: Secondary | ICD-10-CM | POA: Diagnosis not present

## 2017-03-19 DIAGNOSIS — Z1401 Asymptomatic hemophilia A carrier: Secondary | ICD-10-CM | POA: Insufficient documentation

## 2017-03-19 DIAGNOSIS — M5416 Radiculopathy, lumbar region: Secondary | ICD-10-CM

## 2017-03-19 DIAGNOSIS — M47896 Other spondylosis, lumbar region: Secondary | ICD-10-CM | POA: Insufficient documentation

## 2017-03-19 DIAGNOSIS — Z760 Encounter for issue of repeat prescription: Secondary | ICD-10-CM | POA: Diagnosis present

## 2017-03-19 DIAGNOSIS — J449 Chronic obstructive pulmonary disease, unspecified: Secondary | ICD-10-CM | POA: Diagnosis not present

## 2017-03-19 DIAGNOSIS — G4700 Insomnia, unspecified: Secondary | ICD-10-CM | POA: Diagnosis not present

## 2017-03-19 DIAGNOSIS — Z794 Long term (current) use of insulin: Secondary | ICD-10-CM | POA: Insufficient documentation

## 2017-03-19 DIAGNOSIS — R079 Chest pain, unspecified: Secondary | ICD-10-CM | POA: Insufficient documentation

## 2017-03-19 DIAGNOSIS — R000 Tachycardia, unspecified: Secondary | ICD-10-CM | POA: Insufficient documentation

## 2017-03-19 DIAGNOSIS — I252 Old myocardial infarction: Secondary | ICD-10-CM | POA: Insufficient documentation

## 2017-03-19 DIAGNOSIS — M25562 Pain in left knee: Secondary | ICD-10-CM

## 2017-03-19 DIAGNOSIS — Z79899 Other long term (current) drug therapy: Secondary | ICD-10-CM | POA: Diagnosis not present

## 2017-03-19 DIAGNOSIS — G894 Chronic pain syndrome: Secondary | ICD-10-CM | POA: Diagnosis not present

## 2017-03-19 DIAGNOSIS — G4733 Obstructive sleep apnea (adult) (pediatric): Secondary | ICD-10-CM | POA: Diagnosis not present

## 2017-03-19 DIAGNOSIS — M7541 Impingement syndrome of right shoulder: Secondary | ICD-10-CM | POA: Diagnosis not present

## 2017-03-19 DIAGNOSIS — E785 Hyperlipidemia, unspecified: Secondary | ICD-10-CM | POA: Insufficient documentation

## 2017-03-19 DIAGNOSIS — G8929 Other chronic pain: Secondary | ICD-10-CM | POA: Diagnosis not present

## 2017-03-19 MED ORDER — METHYLPREDNISOLONE 4 MG PO TBPK: ORAL_TABLET | ORAL | 0 refills | Status: DC

## 2017-03-19 MED ORDER — DULOXETINE HCL 30 MG PO CPEP: 30.0000 mg | ORAL_CAPSULE | Freq: Every day | ORAL | 3 refills | Status: DC

## 2017-03-19 MED ORDER — MORPHINE SULFATE ER 30 MG PO TBCR: 30.0000 mg | EXTENDED_RELEASE_TABLET | Freq: Three times a day (TID) | ORAL | 0 refills | Status: DC

## 2017-03-19 NOTE — Progress Notes (Signed)
Subjective:    Patient ID: [name], female    DOB: September 27, 1950, 67 y.o.   MRN: [id number]  HPI: [name] is a 67year old female who returns for follow up appointmentfor chronic pain and medication refill. She states her pain is located in her right wrist with tenderness and pain with ecchymosis noted, bilateral shoulders, lowerback radiating into herbilateral hips and bilateral lower extremities and bilateral feet. Also reports she has noticed various discoloration areas appear on her body then they disappear, she has not spoke with her PCP regarding this issue. Also  states she has been diagnosed with hemophilia trait. She states she has made calls to her PCP with no  return call. I asked [name]   to place a call to her PCP, Dr. Woody Seller, she has a scheduled appointment for 03/20/2017 at 3:45 p.m. She hadn't  followed up with her PCP regarding her concerns regarding the above nor post admission of chest pain on 12/04/ 2018 and discharged on 02/18/2017.  She rates her pain 8. Her current exercise regime is performing chair exercises and walking short distances with her walker.   Dr. Letta Pate assess [name] right wrist/ ? Vasculitis, will f/u with PCP.   [name] Morphine equivalent is 90 MME.  She is also prescribed Klonopin .We have reviewed the black box warning again regarding using opioids and benzodiazepines. I highlighted the dangers of using these drugs together and discussed the adverse events including respiratory suppression, overdose, cognitive impairment and importance of  compliance with current regimen. She verbalizes understanding, we will continue to monitor and adjust as indicated.     UDS was performed on 01/28/2017 it was consistent.   Pain Inventory Average Pain 7 Pain Right Now 8 My pain is sharp, burning, dull, stabbing, tingling and aching  In the last 24 hours, has pain interfered with the following? General activity 6 Relation with others  7 Enjoyment of life 7 What TIME of day is your pain at its worst? morning and night Sleep (in general) Poor  Pain is worse with: walking, bending, sitting, inactivity, standing and some activites Pain improves with: heat/ice and medication Relief from Meds: 3  Mobility walk with assistance use a walker how many minutes can you walk? 5 ability to climb steps?  yes do you drive?  yes needs help with transfers Do you have any goals in this area?  no  Function disabled: date disabled . I need assistance with the following:  dressing, bathing, toileting, meal prep, household duties and shopping Do you have any goals in this area?  no  Neuro/Psych bladder control problems weakness numbness tingling trouble walking spasms dizziness confusion depression anxiety  Prior Studies Any changes since last visit?  no  Physicians involved in your care Any changes since last visit?  no   Family History  Problem Relation Age of Onset  . Heart attack Mother   . Colon cancer Mother   . Heart attack Father   . Stroke Sister   . Hemophilia Child   . Cancer Unknown   . Coronary artery disease Unknown   . Cancer Brother   . Cancer Maternal Aunt   . Cancer Maternal Uncle   . Cancer Paternal Aunt   . Cancer Paternal Uncle   . Cancer Brother    Social History   Socioeconomic History  . Marital status: Legally Separated    Spouse name: None  . Number of children: 4  . Years  of education: None  . Highest education level: None  Social Needs  . Financial resource strain: None  . Food insecurity - worry: None  . Food insecurity - inability: None  . Transportation needs - medical: None  . Transportation needs - non-medical: None  Occupational History  . Occupation: Disabled  Tobacco Use  . Smoking status: Never Smoker  . Smokeless tobacco: Never Used  Substance and Sexual Activity  . Alcohol use: No    Alcohol/week: 0.0 oz  . Drug use: No  . Sexual activity: None   Other Topics Concern  . None  Social History Narrative   Patient does not get regular exercise   Denies caffeine use    Past Surgical History:  Procedure Laterality Date  . ABDOMINAL HYSTERECTOMY    . Arm Surgery Bilateral    due to fall-pt broke both forearms  . BIOPSY N/A 07/01/2013   Procedure: BIOPSY;  Surgeon: Rogene Houston, MD;  Location: [facility];  Service: Endoscopy;  Laterality: N/A;  . CHOLECYSTECTOMY    . COLONOSCOPY  08/06/2011   Procedure: COLONOSCOPY;  Surgeon: Rogene Houston, MD;  [facility];  Service: Endoscopy;  Laterality: N/A;  215  . COLONOSCOPY WITH PROPOFOL N/A 12/31/2012   Procedure: COLONOSCOPY WITH PROPOFOL;  Surgeon: Rogene Houston, MD;  Location: [facility];  Service: Endoscopy;  Laterality: N/A;  in cecum at 0814, total withdrawal time 67min  . CORONARY ANGIOPLASTY WITH STENT PLACEMENT    . ESOPHAGEAL DILATION N/A 06/07/2015   Procedure: ESOPHAGEAL DILATION;  Surgeon: Rogene Houston, MD;  [facility];  Service: Endoscopy;  Laterality: N/A;  . ESOPHAGOGASTRODUODENOSCOPY N/A 06/07/2015   Procedure: ESOPHAGOGASTRODUODENOSCOPY (EGD);  Surgeon: Rogene Houston, MD;  [facility];  Service: Endoscopy;  Laterality: N/A;  2:45 - moved to 1:00 - Ann to notify pt  . ESOPHAGOGASTRODUODENOSCOPY (EGD) WITH PROPOFOL N/A 12/31/2012   Procedure: ESOPHAGOGASTRODUODENOSCOPY (EGD) WITH PROPOFOL;  Surgeon: Rogene Houston, MD;  [facility];  Service: Endoscopy;  Laterality: N/A;  GE junction 37,  . ESOPHAGOGASTRODUODENOSCOPY (EGD) WITH PROPOFOL N/A 07/01/2013   Procedure: ESOPHAGOGASTRODUODENOSCOPY (EGD) WITH PROPOFOL;  Surgeon: Rogene Houston, MD;  [facility];  Service: Endoscopy;  Laterality: N/A;  950  . MALONEY DILATION N/A 12/31/2012   Procedure: MALONEY DILATION;  Surgeon: Rogene Houston, MD;  [facility];  Service: Endoscopy;  Laterality: N/A;  used a # 54,#56  . MALONEY DILATION N/A 07/01/2013   Procedure: Venia Minks  DILATION;  Surgeon: Rogene Houston, MD;  [facility];  Service: Endoscopy;  Laterality: N/A;  54/58; no heme present   Past Medical History:  Diagnosis Date  . Anxiety and depression   . Arthritis   . CAD (coronary artery disease)    Stent placement circumflex coronary 2007, catheterization 2008 patent stents. Normal LV function  . Chest pain   . CHF (congestive heart failure) ([place])   . COPD (chronic obstructive pulmonary disease) (HCC)    no home O2  . Depression   . Diabetes mellitus    Insulin dependent  . Diabetic polyneuropathy (HCC)     severe on multiple medications  . Dyslipidemia   . GERD (gastroesophageal reflux disease)   . Headache(784.0)   . 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 Hypertension   . MI (myocardial infarction) ([place])    2007  . Obstructive sleep apnea    CPAP, setting ?2  . Palpitations   . Tachycardia  BP 115/67   Pulse 60   SpO2 95%   Opioid Risk Score:  1 Fall Risk Score:  `1  Depression screen PHQ 2/9  Depression screen [facility] 2/9 02/17/2017 01/14/2017 12/24/2016 11/11/2016 10/28/2016 08/07/2016 05/14/2016  Decreased Interest 0 3 0 0 2 0 0  Down, Depressed, Hopeless 0 3 0 0 - 0 0  PHQ - 2 Score 0 6 0 0 2 0 0  Altered sleeping - - - - - - -  Tired, decreased energy - - - - - - -  Change in appetite - - - - - - -  Feeling bad or failure about yourself  - - - - - - -  Trouble concentrating - - - - - - -  Moving slowly or fidgety/restless - - - - - - -  Suicidal thoughts - - - - - - -  PHQ-9 Score - - - - - - -  Some recent data might be hidden   Review of Systems  Constitutional: Positive for diaphoresis.  HENT: Negative.   Eyes: Negative.   Respiratory: Positive for apnea and shortness of breath.   Gastrointestinal: Positive for nausea and vomiting.  Endocrine: Negative.        High and low blood sugar  Genitourinary: Positive for dysuria.       Bladder control  Musculoskeletal: Positive for arthralgias, back pain and gait problem.        Spasms  Allergic/Immunologic: Negative.   Neurological: Positive for dizziness, weakness and numbness.       Tingling  Hematological: Negative.   Psychiatric/Behavioral: Positive for confusion and dysphoric mood. The patient is nervous/anxious.   All other systems reviewed and are negative.      Objective:   Physical Exam  Constitutional: She is oriented to person, place, and time. She appears well-developed and well-nourished.  HENT:  Head: Normocephalic and atraumatic.  Neck: Normal range of motion. Neck supple.  Cardiovascular: Normal rate and regular rhythm.  Pulmonary/Chest: Effort normal and breath sounds normal.  Musculoskeletal:  Normal Muscle Bulk and Muscle Testing Reveals:  Upper Extremities: Full  ROM and Muscle Strength 5/5 Bilateral AC Joint Tenderness: R>L Lumbar Hypersensitivity Bilateral Greater Trochanter Tenderness Lower Extremities: Full  ROM and Muscle Strength 5/5 Arises from Table slowly using walker for support Narrow Based Gait  Neurological: She is alert and oriented to person, place, and time.  Skin: Skin is warm and dry.  Psychiatric: She has a normal mood and affect.  Nursing note and vitals reviewed.         Assessment & Plan:  1.Lumbar pain lumbar spondylosis: Encouraged to increase Activity as tolerated. 03/19/2017 Refilled: MS Contin 30 MG one tablet every 8 hours #90. We will continue the opioid monitoring program, this consists of regular clinic visits, examinations, urine drug screen, pill counts as well as use of [place] Controlled Substance Reporting System. 2. Lumbar Radiculitis: Continue Gabapentin. 03/19/2017 3. Bilateral Knee Pain/ Degenerative: Continue Voltaren Gel. Continue to Monitor. 03/19/2017. 4. Severe diabetic poly neuropathy: Continue Gabapentin. 03/19/2017 5. Insomnia: ContinueTrazodone PCP Following. 03/19/2017 6. Anxiety/ Depression: Continue Cymbalta and Klonopin . 03/19/2017 7. [place]  Tenosynovitis: RX: Medrol Dose Pak 8. Acute Right Wrist Pain/ ? Vasculitis: Will F/U with her PCP on 03/20/2017.  9. Bilateral Greater Trochanteric Bursitis: RX: Medrol Dose Pak 10. Acute Pain of Left Shoulder/ Right Shoulder Impingement: Medrol Dose Pak: Continue to Monitor  40  minutes of face to face patient care time was spent during  this visit. All questions were encouraged and answered.  F/U in 1 month

## 2017-03-24 LAB — TOXASSURE SELECT,+ANTIDEPR,UR

## 2017-03-24 LAB — 6-ACETYLMORPHINE,TOXASSURE ADD
6-ACETYLMORPHINE: NEGATIVE
6-acetylmorphine: NOT DETECTED ng/mg creat

## 2017-03-25 ENCOUNTER — Telehealth: Payer: Self-pay | Admitting: *Deleted

## 2017-03-25 NOTE — Telephone Encounter (Signed)
Urine drug screen for this encounter is consistent for prescribed medication.  There is still concern that she is taking 2 SSRI and an SSRNI.  She does not take her own medication but what the pharmacy places in her blister packs for daily meds and therefore does not know the names of what she is taking daily..  I have called Dr Jannifer Franklin office and spoke with Selinda Eon since they are not on Epic to inquire if Dr Woody Seller is aware that she is taking Cymbalta from our office.  I have faxed her medication list from Epic and this current UDS showing the presence of Cymbalta, Wellbutrin, and Celexa.  Selinda Eon will pass this informaion on to Dr Woody Seller.

## 2017-03-26 ENCOUNTER — Encounter: Payer: Self-pay | Admitting: *Deleted

## 2017-03-27 ENCOUNTER — Other Ambulatory Visit: Payer: Self-pay

## 2017-03-27 ENCOUNTER — Ambulatory Visit (INDEPENDENT_AMBULATORY_CARE_PROVIDER_SITE_OTHER): Payer: Medicaid Other | Admitting: Cardiovascular Disease

## 2017-03-27 ENCOUNTER — Encounter: Payer: Self-pay | Admitting: *Deleted

## 2017-03-27 VITALS — BP 122/71 | HR 62 | Ht 64.0 in | Wt 232.2 lb

## 2017-03-27 DIAGNOSIS — R079 Chest pain, unspecified: Secondary | ICD-10-CM | POA: Diagnosis not present

## 2017-03-27 DIAGNOSIS — Z955 Presence of coronary angioplasty implant and graft: Secondary | ICD-10-CM

## 2017-03-27 DIAGNOSIS — I10 Essential (primary) hypertension: Secondary | ICD-10-CM

## 2017-03-27 DIAGNOSIS — I5032 Chronic diastolic (congestive) heart failure: Secondary | ICD-10-CM | POA: Diagnosis not present

## 2017-03-27 DIAGNOSIS — K219 Gastro-esophageal reflux disease without esophagitis: Secondary | ICD-10-CM | POA: Diagnosis not present

## 2017-03-27 DIAGNOSIS — I25118 Atherosclerotic heart disease of native coronary artery with other forms of angina pectoris: Secondary | ICD-10-CM | POA: Diagnosis not present

## 2017-03-27 DIAGNOSIS — E782 Mixed hyperlipidemia: Secondary | ICD-10-CM

## 2017-03-27 NOTE — Patient Instructions (Signed)
Your physician wants you to follow-up in: [place] will receive a reminder letter in the mail two months in advance. If you don't receive a letter, please call our office to schedule the follow-up appointment.  Your physician has recommended you make the following change in your medication:   STOP IMDUR   Thank you for choosing [facility]!!

## 2017-03-27 NOTE — Progress Notes (Signed)
SUBJECTIVE: The patient presents for routine follow-up.  I evaluated her in December 2018 when she was hospitalized for chest pain.  Did not appear to be cardiac in etiology.  Nonetheless, I attempted isosorbide mononitrate 30 mg daily.  She had a normal nuclear stress test in June 2018.  She has a history of an MI in 2007 with PCI of the circumflex.  She also has a history of insulin-dependent diabetes mellitus, sleep apnea, irritable bowel syndrome, GERD, chronic diastolic heart failure, tachycardia and palpitations, and dyslipidemia.  She does not feel like the Imdur significantly alleviated her symptoms.  She continues to have dysphagia for solids and food gets stuck in her throat shortly after attempting to swallow.  She has had some issues with her CPAP and plans to have it adjusted.  She denies palpitations leg swelling.  There is no exertional component to her chest pain.     Review of Systems: As per "subjective", otherwise negative.  Allergies  Allergen Reactions  . Nitrofuran Derivatives Itching and Swelling  . Amphetamine-Dextroamphet Er Swelling  . Pregabalin Swelling  . Topiramate Other (See Comments)    Tongue tingle  . Verelan [Verapamil] Rash    Current Outpatient Medications  Medication Sig Dispense Refill  . ACCU-CHEK AVIVA PLUS test strip USE TO CHECK BLOOD SUGAR THREE TIMES DAILY - DX: E11.9 150 each 5  . aspirin EC 81 MG tablet Take 81 mg by mouth daily.    Marland Kitchen atorvastatin (LIPITOR) 40 MG tablet Take 40 mg by mouth daily.    . beclomethasone (QVAR) 40 MCG/ACT inhaler Inhale 1 puff into the lungs 2 (two) times daily.    Marland Kitchen buPROPion (WELLBUTRIN SR) 100 MG 12 hr tablet Take 100 mg by mouth daily.    . citalopram (CELEXA) 40 MG tablet TAKE ONE TABLET BY MOUTH EVERY EVENING (CORRECTED DOSE)  12  . clonazePAM (KLONOPIN) 0.5 MG tablet Take 1 tablet (0.5 mg total) by mouth 2 (two) times daily as needed for anxiety. 60 tablet 2  . diclofenac sodium (VOLTAREN) 1 %  GEL APPLY TO BOTH KNEES THREE TIMES DAILY 300 g 2  . DULoxetine (CYMBALTA) 30 MG capsule Take 1 capsule (30 mg total) by mouth daily. 30 capsule 3  . DUREZOL 0.05 % EMUL INSTILL ONE DROP INTO THE RIGHT EYE THREE TIMES DAILY, TAPER OVER THREE WEEKS  0  . estrogens, conjugated, (PREMARIN) 1.25 MG tablet Take 1.25 mg by mouth daily.     . fluticasone-salmeterol (ADVAIR HFA) 115-21 MCG/ACT inhaler Inhale 2 puffs into the lungs 2 (two) times daily.    . furosemide (LASIX) 20 MG tablet Take 20 mg by mouth daily as needed.  1  . gabapentin (NEURONTIN) 400 MG capsule Take 400 mg by mouth 3 (three) times daily.  5  . GLOBAL EASE INJECT PEN NEEDLES 31G X 8 MM MISC USE AS DIRECTED FOUR TIMES DAILY 100 each 3  . LANTUS SOLOSTAR 100 UNIT/ML Solostar Pen INJECT 50 UNITS INTO THE SKIN AT BEDTIME. 15 mL 2  . lidocaine (XYLOCAINE) 5 % ointment Apply 1 application topically at bedtime.     . meloxicam (MOBIC) 7.5 MG tablet Take 1 tablet (7.5 mg total) by mouth daily.  4  . metFORMIN (GLUCOPHAGE) 500 MG tablet TAKE ONE TABLET BY MOUTH TWICE DAILY with a meal 60 tablet 2  . methocarbamol (ROBAXIN) 500 MG tablet Take 1 tablet (500 mg total) by mouth 3 (three) times daily. 45 tablet 0  . metoprolol  succinate (TOPROL-XL) 50 MG 24 hr tablet TAKE 1 AND 1/2 TABLETS BY MOUTH EVERY DAY 135 tablet 1  . montelukast (SINGULAIR) 10 MG tablet Take 10 mg by mouth daily.  5  . morphine (MS CONTIN) 30 MG 12 hr tablet Take 1 tablet (30 mg total) by mouth every 8 (eight) hours. 90 tablet 0  . Multiple Vitamin (MULTIVITAMIN WITH MINERALS) TABS tablet Take 1 tablet by mouth daily.    . nitroGLYCERIN (NITROSTAT) 0.4 MG SL tablet Place 1 tablet (0.4 mg total) under the tongue every 5 (five) minutes x 3 doses as needed. For chest pains 25 tablet 3  . ondansetron (ZOFRAN) 4 MG tablet Take 4 mg by mouth every 6 (six) hours as needed for nausea or vomiting.    . pantoprazole (PROTONIX) 40 MG tablet TAKE ONE TABLET BY MOUTH TWICE DAILY BEFORE  MEALS 60 tablet 5  . potassium chloride (K-DUR,KLOR-CON) 10 MEQ tablet Take 10 mEq by mouth 2 (two) times daily.  12  . PROAIR HFA 108 (90 Base) MCG/ACT inhaler inhale TWO puffs FOUR TIMES DAILY AS NEEDED  8  . promethazine (PHENERGAN) 25 MG tablet Take 25 mg by mouth 4 (four) times daily as needed.  1  . psyllium (METAMUCIL SMOOTH TEXTURE) 28 % packet Take 1 packet by mouth at bedtime.    . ranitidine (ZANTAC) 300 MG tablet Take 1 tablet (300 mg total) by mouth at bedtime as needed for heartburn.  3  . topiramate (TOPAMAX) 200 MG tablet Take 200 mg by mouth at bedtime.  3  . traZODone (DESYREL) 50 MG tablet Take 50 mg by mouth at bedtime.  2  . isosorbide mononitrate (IMDUR) 30 MG 24 hr tablet Take 1 tablet (30 mg total) by mouth daily. 30 tablet 0   No current facility-administered medications for this visit.     Past Medical History:  Diagnosis Date  . Anxiety and depression   . Arthritis   . CAD (coronary artery disease)    Stent placement circumflex coronary 2007, catheterization 2008 patent stents. Normal LV function  . Chest pain   . CHF (congestive heart failure) ([place])   . COPD (chronic obstructive pulmonary disease) ([facility])    no home O2  . Depression   . Diabetes mellitus    Insulin dependent  . Diabetic polyneuropathy ([facility])     severe on multiple medications  . Dyslipidemia   . GERD (gastroesophageal reflux disease)   . Headache(784.0)   . 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 Hypertension   . MI (myocardial infarction) ([place])    2007  . Obstructive sleep apnea    CPAP, setting ?2  . Palpitations   . Tachycardia     Past Surgical History:  Procedure Laterality Date  . ABDOMINAL HYSTERECTOMY    . Arm Surgery Bilateral    due to fall-pt broke both forearms  . BIOPSY N/A 07/01/2013   Procedure: BIOPSY;  Surgeon: Rogene Houston, MD;  Location: [facility];  Service: Endoscopy;  Laterality: N/A;  . CHOLECYSTECTOMY    . COLONOSCOPY  08/06/2011   Procedure: COLONOSCOPY;  Surgeon:  Rogene Houston, MD;  [facility];  Service: Endoscopy;  Laterality: N/A;  215  . COLONOSCOPY WITH PROPOFOL N/A 12/31/2012   Procedure: COLONOSCOPY WITH PROPOFOL;  Surgeon: Rogene Houston, MD;  Location: [facility];  Service: Endoscopy;  Laterality: N/A;  in cecum at 0814, total withdrawal time 33mn  . CORONARY ANGIOPLASTY WITH STENT PLACEMENT    .  ESOPHAGEAL DILATION N/A 06/07/2015   Procedure: ESOPHAGEAL DILATION;  Surgeon: Rogene Houston, MD;  [facility];  Service: Endoscopy;  Laterality: N/A;  . ESOPHAGOGASTRODUODENOSCOPY N/A 06/07/2015   Procedure: ESOPHAGOGASTRODUODENOSCOPY (EGD);  Surgeon: Rogene Houston, MD;  [facility];  Service: Endoscopy;  Laterality: N/A;  2:45 - moved to 1:00 - Ann to notify pt  . ESOPHAGOGASTRODUODENOSCOPY (EGD) WITH PROPOFOL N/A 12/31/2012   Procedure: ESOPHAGOGASTRODUODENOSCOPY (EGD) WITH PROPOFOL;  Surgeon: Rogene Houston, MD;  Location: [facility];  Service: Endoscopy;  Laterality: N/A;  GE junction 37,  . ESOPHAGOGASTRODUODENOSCOPY (EGD) WITH PROPOFOL N/A 07/01/2013   Procedure: ESOPHAGOGASTRODUODENOSCOPY (EGD) WITH PROPOFOL;  Surgeon: Rogene Houston, MD;  Location: [facility];  Service: Endoscopy;  Laterality: N/A;  950  . MALONEY DILATION N/A 12/31/2012   Procedure: MALONEY DILATION;  Surgeon: Rogene Houston, MD;  Location: [facility];  Service: Endoscopy;  Laterality: N/A;  used a # 54,#56  . MALONEY DILATION N/A 07/01/2013   Procedure: Venia Minks DILATION;  Surgeon: Rogene Houston, MD;  Location: [facility];  Service: Endoscopy;  Laterality: N/A;  54/58; no heme present    Social History   Socioeconomic History  . Marital status: Legally Separated    Spouse name: Not on file  . Number of children: 4  . Years of education: Not on file  . Highest education level: Not on file  Social Needs  . Financial resource strain: Not on file  . Food insecurity - worry: Not on file  . Food insecurity - inability: Not on file  . Transportation  needs - medical: Not on file  . Transportation needs - non-medical: Not on file  Occupational History  . Occupation: Disabled  Tobacco Use  . Smoking status: Never Smoker  . Smokeless tobacco: Never Used  Substance and Sexual Activity  . Alcohol use: No    Alcohol/week: 0.0 oz  . Drug use: No  . Sexual activity: Not on file  Other Topics Concern  . Not on file  Social History Narrative   Patient does not get regular exercise   Denies caffeine use      Vitals:   03/27/17 1049  BP: 122/71  Pulse: 62  Weight: 232 lb 3.2 oz (105.3 kg)  Height: 5' 4"  (1.626 m)    Wt Readings from Last 3 Encounters:  03/27/17 232 lb 3.2 oz (105.3 kg)  02/17/17 231 lb 4.8 oz (104.9 kg)  02/17/17 232 lb (105.2 kg)     PHYSICAL EXAM General: NAD HEENT: Normal. Neck: No JVD, no thyromegaly. Lungs: Clear to auscultation bilaterally with normal respiratory effort. CV: Regular rate and rhythm, normal S1/S2, no S3/S4, no murmur. No pretibial or periankle edema.  No carotid bruit.   Abdomen: Soft, nontender, no distention.  Neurologic: Alert and oriented.  Psych: Normal affect. Skin: Normal. Musculoskeletal: No gross deformities.    ECG: Most recent ECG reviewed.   Labs: Lab Results  Component Value Date/Time   K 3.9 02/17/2017 02:09 PM   BUN 14 02/17/2017 02:09 PM   BUN 11 11/06/2016 11:12 AM   CREATININE 0.69 02/17/2017 02:09 PM   CREATININE 0.65 02/09/2017 10:25 AM   ALT 8 11/06/2016 11:12 AM   TSH 2.90 02/09/2017 10:25 AM   HGB 13.5 02/17/2017 02:09 PM     Lipids: Lab Results  Component Value Date/Time   LDLCALC UNABLE TO CALCULATE IF TRIGLYCERIDE OVER 400 mg/dL 02/18/2017 05:28 AM   LDLCALC 50 04/15/2016 11:21 AM   CHOL  151 02/18/2017 05:28 AM   CHOL 184 04/15/2016 11:21 AM   TRIG 439 (H) 02/18/2017 05:28 AM   HDL 46 02/18/2017 05:28 AM   HDL 77 04/15/2016 11:21 AM       ASSESSMENT AND PLAN:  1. CAD: Stable ischemic heart disease. Normal nuclear stress test in  June 2018 as detailed above. Continue aspirin, Lipitor, and metoprolol.  I will discontinue Imdur as it appears most of her symptoms are GI related.  2. Chronic diastolic heart failure: Euvolemic. No changes. Not on diuretic therapy.  3. Essential hypertension: Controlled. No changes.  4. Hyperlipidemia: Continue Lipitor.  5. GERD: Taking Protonix 40 mg twice daily. Has a history of dysphagia for solids and previous esophageal dilatations. Follows with GI.  I encouraged her to make an appointment in the near future.     Disposition: Follow up 1 year.   Kate Sable, M.D., F.A.C.C.

## 2017-04-13 ENCOUNTER — Other Ambulatory Visit: Payer: Self-pay | Admitting: Registered Nurse

## 2017-04-14 ENCOUNTER — Other Ambulatory Visit: Payer: Self-pay

## 2017-04-14 ENCOUNTER — Encounter: Payer: Medicaid Other | Admitting: Registered Nurse

## 2017-04-14 ENCOUNTER — Encounter: Payer: Self-pay | Admitting: Registered Nurse

## 2017-04-14 VITALS — BP 147/80 | HR 71

## 2017-04-14 DIAGNOSIS — M25562 Pain in left knee: Secondary | ICD-10-CM

## 2017-04-14 DIAGNOSIS — F329 Major depressive disorder, single episode, unspecified: Secondary | ICD-10-CM | POA: Diagnosis not present

## 2017-04-14 DIAGNOSIS — G8929 Other chronic pain: Secondary | ICD-10-CM | POA: Diagnosis not present

## 2017-04-14 DIAGNOSIS — E1142 Type 2 diabetes mellitus with diabetic polyneuropathy: Secondary | ICD-10-CM | POA: Diagnosis not present

## 2017-04-14 DIAGNOSIS — Z5181 Encounter for therapeutic drug level monitoring: Secondary | ICD-10-CM | POA: Diagnosis not present

## 2017-04-14 DIAGNOSIS — F411 Generalized anxiety disorder: Secondary | ICD-10-CM | POA: Diagnosis not present

## 2017-04-14 DIAGNOSIS — M25561 Pain in right knee: Secondary | ICD-10-CM | POA: Diagnosis not present

## 2017-04-14 DIAGNOSIS — Z79899 Other long term (current) drug therapy: Secondary | ICD-10-CM

## 2017-04-14 DIAGNOSIS — M7061 Trochanteric bursitis, right hip: Secondary | ICD-10-CM

## 2017-04-14 DIAGNOSIS — M5416 Radiculopathy, lumbar region: Secondary | ICD-10-CM

## 2017-04-14 DIAGNOSIS — G894 Chronic pain syndrome: Secondary | ICD-10-CM

## 2017-04-14 DIAGNOSIS — M7062 Trochanteric bursitis, left hip: Secondary | ICD-10-CM

## 2017-04-14 DIAGNOSIS — Z760 Encounter for issue of repeat prescription: Secondary | ICD-10-CM | POA: Diagnosis not present

## 2017-04-14 MED ORDER — MORPHINE SULFATE ER 30 MG PO TBCR: 30.0000 mg | EXTENDED_RELEASE_TABLET | Freq: Three times a day (TID) | ORAL | 0 refills | Status: DC

## 2017-04-14 MED ORDER — CLONAZEPAM 0.5 MG PO TABS: 0.5000 mg | ORAL_TABLET | Freq: Two times a day (BID) | ORAL | 2 refills | Status: DC | PRN

## 2017-04-14 NOTE — Progress Notes (Signed)
Subjective:    Patient ID: [name], female    DOB: 07/06/1950, 67 y.o.   MRN: [id number]  HPI: [name] is a 67year old female who returns for follow up appointmentfor chronic pain and medication refill. She states her pain is located in her neck, lower back radiating into her bilateral lower extremities and bilateral feet. Also reports bilateral hip pain R>L.  She rates her pain 8. Her current exercise regime is performing chair exercises and walking short distances with her walker.   [name] Morphine equivalent is 99 MME.  She is also prescribed Klonopin .We have reviewed the black box warning again regarding using opioids and benzodiazepines. I highlighted the dangers of using these drugs together and discussed the adverse events including respiratory suppression, overdose, cognitive impairment and importance of  compliance with current regimen. She verbalizes understanding, we will continue to monitor and adjust as indicated.     UDS was performed on 01/28/2017 it was consistent.   Pain Inventory Average Pain 7 Pain Right Now 8 My pain is sharp, burning, dull, stabbing, tingling and aching  In the last 24 hours, has pain interfered with the following? General activity 7 Relation with others 6 Enjoyment of life 8 What TIME of day is your pain at its worst? morning and night Sleep (in general) Poor  Pain is worse with: walking, bending, sitting, inactivity, standing and some activites Pain improves with: heat/ice and medication Relief from Meds: 3  Mobility walk with assistance use a walker how many minutes can you walk? 5 ability to climb steps?  no do you drive?  yes needs help with transfers Do you have any goals in this area?  no  Function disabled: date disabled . I need assistance with the following:  dressing, bathing, toileting, meal prep, household duties and shopping Do you have any goals in this area?  no  Neuro/Psych bladder control  problems weakness numbness tingling trouble walking spasms dizziness confusion depression anxiety  Prior Studies Any changes since last visit?  no  Physicians involved in your care Any changes since last visit?  no   Family History  Problem Relation Age of Onset  . Heart attack Mother   . Colon cancer Mother   . Heart attack Father   . Stroke Sister   . Hemophilia Child   . Cancer Unknown   . Coronary artery disease Unknown   . Cancer Brother   . Cancer Maternal Aunt   . Cancer Maternal Uncle   . Cancer Paternal Aunt   . Cancer Paternal Uncle   . Cancer Brother    Social History   Socioeconomic History  . Marital status: Legally Separated    Spouse name: None  . Number of children: 4  . Years of education: None  . Highest education level: None  Social Needs  . Financial resource strain: None  . Food insecurity - worry: None  . Food insecurity - inability: None  . Transportation needs - medical: None  . Transportation needs - non-medical: None  Occupational History  . Occupation: Disabled  Tobacco Use  . Smoking status: Never Smoker  . Smokeless tobacco: Never Used  Substance and Sexual Activity  . Alcohol use: No    Alcohol/week: 0.0 oz  . Drug use: No  . Sexual activity: None  Other Topics Concern  . None  Social History Narrative   Patient does not get regular exercise   Denies caffeine use  Past Surgical History:  Procedure Laterality Date  . ABDOMINAL HYSTERECTOMY    . Arm Surgery Bilateral    due to fall-pt broke both forearms  . BIOPSY N/A 07/01/2013   Procedure: BIOPSY;  Surgeon: Rogene Houston, MD;  [facility];  Service: Endoscopy;  Laterality: N/A;  . CHOLECYSTECTOMY    . COLONOSCOPY  08/06/2011   Procedure: COLONOSCOPY;  Surgeon: Rogene Houston, MD;  [facility];  Service: Endoscopy;  Laterality: N/A;  215  . COLONOSCOPY WITH PROPOFOL N/A 12/31/2012   Procedure: COLONOSCOPY WITH PROPOFOL;  Surgeon: Rogene Houston, MD;  Location: [facility];  Service: Endoscopy;  Laterality: N/A;  in cecum at 0814, total withdrawal time 81mn  . CORONARY ANGIOPLASTY WITH STENT PLACEMENT    . ESOPHAGEAL DILATION N/A 06/07/2015   Procedure: ESOPHAGEAL DILATION;  Surgeon: NRogene Houston MD;  [facility];  Service: Endoscopy;  Laterality: N/A;  . ESOPHAGOGASTRODUODENOSCOPY N/A 06/07/2015   Procedure: ESOPHAGOGASTRODUODENOSCOPY (EGD);  Surgeon: NRogene Houston MD;  [facility];  Service: Endoscopy;  Laterality: N/A;  2:45 - moved to 1:00 - Ann to notify pt  . ESOPHAGOGASTRODUODENOSCOPY (EGD) WITH PROPOFOL N/A 12/31/2012   Procedure: ESOPHAGOGASTRODUODENOSCOPY (EGD) WITH PROPOFOL;  Surgeon: NRogene Houston MD;  [facility];  Service: Endoscopy;  Laterality: N/A;  GE junction 37,  . ESOPHAGOGASTRODUODENOSCOPY (EGD) WITH PROPOFOL N/A 07/01/2013   Procedure: ESOPHAGOGASTRODUODENOSCOPY (EGD) WITH PROPOFOL;  Surgeon: NRogene Houston MD;  [facility];  Service: Endoscopy;  Laterality: N/A;  950  . MALONEY DILATION N/A 12/31/2012   Procedure: MALONEY DILATION;  Surgeon: NRogene Houston MD;  [facility];  Service: Endoscopy;  Laterality: N/A;  used a # 54,#56  . MALONEY DILATION N/A 07/01/2013   Procedure: MVenia MinksDILATION;  Surgeon: NRogene Houston MD;  [facility];  Service: Endoscopy;  Laterality: N/A;  54/58; no heme present   Past Medical History:  Diagnosis Date  . Anxiety and depression   . Arthritis   . CAD (coronary artery disease)    Stent placement circumflex coronary 2007, catheterization 2008 patent stents. Normal LV function  . Chest pain   . [place]   . COPD (chronic obstructive pulmonary disease) ([facility])    no home O2  . Depression   . Diabetes mellitus    Insulin dependent  . Diabetic polyneuropathy ([facility])     severe on multiple medications  . Dyslipidemia   . GERD (gastroesophageal reflux disease)   . Headache(784.0)   . Hemophilia  A carrier   . Hypertension   . [place]    [place]  . Obstructive sleep apnea    CPAP, setting ?2  . Palpitations   . Tachycardia    BP (!) 147/80   Pulse 71   SpO2 93%   Opioid Risk Score:  1 Fall Risk Score:  `1  Depression screen PHQ 2/9  [facility] 04/14/2017 02/17/2017 01/14/2017 12/24/2016 11/11/2016 10/28/2016 08/07/2016  Decreased Interest 3 0 3 0 0 2 0  Down, Depressed, Hopeless 3 0 3 0 0 - 0  PHQ - 2 Score 6 0 6 0 0 2 0  Altered sleeping - - - - - - -  Tired, decreased energy - - - - - - -  Change in appetite - - - - - - -  Feeling bad or failure about yourself  - - - - - - -  Trouble concentrating - - - - - - -  Moving slowly or fidgety/restless - - - - - - -  Suicidal thoughts - - - - - - -  PHQ-9 Score - - - - - - -  Some recent data might be hidden   Review of Systems  Constitutional: Positive for diaphoresis.  HENT: Negative.   Eyes: Negative.   Respiratory: Positive for apnea and shortness of breath.   Gastrointestinal: Positive for nausea and vomiting.  Endocrine: Negative.        High and low blood sugar  Genitourinary: Positive for dysuria.       Bladder control  Musculoskeletal: Positive for arthralgias, back pain and gait problem.       Spasms  Allergic/Immunologic: Negative.   Neurological: Positive for dizziness, weakness and numbness.       Tingling  Hematological: Negative.   Psychiatric/Behavioral: Positive for confusion and dysphoric mood. The patient is nervous/anxious.   All other systems reviewed and are negative.      Objective:   Physical Exam  Constitutional: She is oriented to person, place, and time. She appears well-developed and well-nourished.  HENT:  Head: Normocephalic and atraumatic.  Neck: Normal range of motion. Neck supple.  Cardiovascular: Normal rate and regular rhythm.  Pulmonary/Chest: Effort normal and breath sounds normal.  Musculoskeletal:  Normal Muscle Bulk and Muscle  Testing Reveals:  Upper Extremities: Full ROM and Muscle Strength 5/5 Thoracic and Lumbar Hypersensitivity Lumbar Hypersensitivity Bilateral  Greater Trochanter Tenderness: R>L Lower Extremities: Full ROM and Muscle Strength 5/5 Bilateral Lower Extremities Flexion Produces Pain into her Lumbar, Bilateral Hips and Bilateral Lower Extremities Arises from Table slowly using walker for support Narrow Based Gait  Neurological: She is alert and oriented to person, place, and time.  Skin: Skin is warm and dry.  Psychiatric: She has a normal mood and affect.  Nursing note and vitals reviewed.         Assessment & Plan:  1.Lumbar pain lumbar spondylosis: Encouraged to increase Activity as tolerated. 04/14/2017 Refilled: MS Contin 30 MG one tablet every 8 hours #90. We will continue the opioid monitoring program, this consists of regular clinic visits, examinations, urine drug screen, pill counts as well as use of [place] Controlled Substance Reporting System. 2. Lumbar Radiculitis: Continue Gabapentin. 04/14/2017 3. Bilateral Knee Pain/ Degenerative: Continue Voltaren Gel. Continue to Monitor. 04/14/2017. 4. Severe diabetic poly neuropathy: Continue Gabapentin. 04/14/2017 5. Insomnia: ContinueTrazodone PCP Following. 04/14/2017 6. Anxiety/ Depression: Continue Cymbalta and Klonopin . 04/14/2017 7. De Quervains Tenosynovitis: No Complaints Today. 04/14/2017 8. Bilateral Greater Trochanteric Bursitis: Continue with Ice and Heat Therapy. Continue to Monitor. 04/14/2017.  20  minutes of face to face patient care time was spent during this visit. All questions were encouraged and answered.  F/U in 1 month

## 2017-05-03 ENCOUNTER — Other Ambulatory Visit: Payer: Self-pay | Admitting: "Endocrinology

## 2017-05-12 LAB — COMPLETE METABOLIC PANEL WITH GFR
AG Ratio: 1.2 (calc) (ref 1.0–2.5)
ALT: 8 U/L (ref 6–29)
AST: 8 U/L — ABNORMAL LOW (ref 10–35)
Albumin: 3.1 g/dL — ABNORMAL LOW (ref 3.6–5.1)
Alkaline phosphatase (APISO): 69 U/L (ref 33–130)
BUN: 10 mg/dL (ref 7–25)
CO2: 26 mmol/L (ref 20–32)
Calcium: 8.5 mg/dL — ABNORMAL LOW (ref 8.6–10.4)
Chloride: 107 mmol/L (ref 98–110)
Creat: 0.73 mg/dL (ref 0.50–0.99)
GFR, Est African American: 101 mL/min/{1.73_m2} (ref >=60)
GFR, Est Non African American: 87 mL/min/{1.73_m2} (ref >=60)
Globulin: 2.5 g/dL (calc) (ref 1.9–3.7)
Glucose, Bld: 230 mg/dL — ABNORMAL HIGH (ref 65–139)
Potassium: 4.6 mmol/L (ref 3.5–5.3)
Sodium: 140 mmol/L (ref 135–146)
Total Bilirubin: 0.4 mg/dL (ref 0.2–1.2)
Total Protein: 5.6 g/dL — ABNORMAL LOW (ref 6.1–8.1)

## 2017-05-12 LAB — HEMOGLOBIN A1C
Hgb A1c MFr Bld: 8.2 % of total Hgb — ABNORMAL HIGH (ref <5.7)
Mean Plasma Glucose: 189 (calc)
eAG (mmol/L): 10.4 (calc)

## 2017-05-13 ENCOUNTER — Other Ambulatory Visit: Payer: Self-pay | Admitting: "Endocrinology

## 2017-05-13 ENCOUNTER — Other Ambulatory Visit: Payer: Self-pay | Admitting: Registered Nurse

## 2017-05-15 ENCOUNTER — Encounter: Payer: Self-pay | Admitting: Physical Medicine & Rehabilitation

## 2017-05-15 ENCOUNTER — Encounter: Payer: Medicaid Other | Attending: Physical Medicine & Rehabilitation

## 2017-05-15 ENCOUNTER — Ambulatory Visit: Payer: Medicaid Other | Admitting: Physical Medicine & Rehabilitation

## 2017-05-15 VITALS — BP 166/83 | HR 64 | Resp 14

## 2017-05-15 DIAGNOSIS — E1142 Type 2 diabetes mellitus with diabetic polyneuropathy: Secondary | ICD-10-CM | POA: Diagnosis not present

## 2017-05-15 DIAGNOSIS — F418 Other specified anxiety disorders: Secondary | ICD-10-CM

## 2017-05-15 DIAGNOSIS — R002 Palpitations: Secondary | ICD-10-CM | POA: Diagnosis not present

## 2017-05-15 DIAGNOSIS — I252 Old myocardial infarction: Secondary | ICD-10-CM | POA: Diagnosis not present

## 2017-05-15 DIAGNOSIS — M1711 Unilateral primary osteoarthritis, right knee: Secondary | ICD-10-CM

## 2017-05-15 DIAGNOSIS — G4733 Obstructive sleep apnea (adult) (pediatric): Secondary | ICD-10-CM | POA: Diagnosis not present

## 2017-05-15 DIAGNOSIS — M47896 Other spondylosis, lumbar region: Secondary | ICD-10-CM | POA: Insufficient documentation

## 2017-05-15 DIAGNOSIS — Z794 Long term (current) use of insulin: Secondary | ICD-10-CM | POA: Insufficient documentation

## 2017-05-15 DIAGNOSIS — M1990 Unspecified osteoarthritis, unspecified site: Secondary | ICD-10-CM | POA: Insufficient documentation

## 2017-05-15 DIAGNOSIS — M79605 Pain in left leg: Secondary | ICD-10-CM | POA: Diagnosis not present

## 2017-05-15 DIAGNOSIS — Z1401 Asymptomatic hemophilia A carrier: Secondary | ICD-10-CM | POA: Diagnosis not present

## 2017-05-15 DIAGNOSIS — G8929 Other chronic pain: Secondary | ICD-10-CM | POA: Insufficient documentation

## 2017-05-15 DIAGNOSIS — G4700 Insomnia, unspecified: Secondary | ICD-10-CM | POA: Insufficient documentation

## 2017-05-15 DIAGNOSIS — M48061 Spinal stenosis, lumbar region without neurogenic claudication: Secondary | ICD-10-CM | POA: Diagnosis not present

## 2017-05-15 DIAGNOSIS — M5489 Other dorsalgia: Secondary | ICD-10-CM | POA: Insufficient documentation

## 2017-05-15 DIAGNOSIS — I2510 Atherosclerotic heart disease of native coronary artery without angina pectoris: Secondary | ICD-10-CM | POA: Insufficient documentation

## 2017-05-15 DIAGNOSIS — J449 Chronic obstructive pulmonary disease, unspecified: Secondary | ICD-10-CM | POA: Diagnosis not present

## 2017-05-15 DIAGNOSIS — M79604 Pain in right leg: Secondary | ICD-10-CM | POA: Insufficient documentation

## 2017-05-15 DIAGNOSIS — M1712 Unilateral primary osteoarthritis, left knee: Secondary | ICD-10-CM

## 2017-05-15 DIAGNOSIS — R51 Headache: Secondary | ICD-10-CM | POA: Diagnosis not present

## 2017-05-15 DIAGNOSIS — K219 Gastro-esophageal reflux disease without esophagitis: Secondary | ICD-10-CM | POA: Insufficient documentation

## 2017-05-15 DIAGNOSIS — E785 Hyperlipidemia, unspecified: Secondary | ICD-10-CM | POA: Diagnosis not present

## 2017-05-15 DIAGNOSIS — I509 Heart failure, unspecified: Secondary | ICD-10-CM | POA: Diagnosis not present

## 2017-05-15 DIAGNOSIS — R000 Tachycardia, unspecified: Secondary | ICD-10-CM | POA: Diagnosis not present

## 2017-05-15 DIAGNOSIS — Z760 Encounter for issue of repeat prescription: Secondary | ICD-10-CM | POA: Diagnosis not present

## 2017-05-15 DIAGNOSIS — R079 Chest pain, unspecified: Secondary | ICD-10-CM | POA: Insufficient documentation

## 2017-05-15 MED ORDER — MORPHINE SULFATE ER 30 MG PO TBCR: 30.0000 mg | EXTENDED_RELEASE_TABLET | Freq: Three times a day (TID) | ORAL | 0 refills | Status: DC

## 2017-05-15 NOTE — Progress Notes (Signed)
Subjective:    Patient ID: [name], female    DOB: 1950/08/20, 67 y.o.   MRN: [id number]  HPI CC:  Chronic low back pain and bilateral knee pain  Starting to get headaches, Had cataract removal and post op had blurriness of vision, headaches started post op and pt plans to see ophthalmologist Dr Marshall Cork next week  No falls or injuries Uses walker outside the home  Patient continues to complain of chronic low back pain as well as elbow pain and knee pain.  She states that her pain is severe despite taking morphine sulfate extended release 30 mg 3 times per day.  She continues to complain of problems with anxiety. She has recently been started on Cymbalta and remains on Celexa, Wellbutrin, as well as Klonopin. The patient complains of some anxiety and poor sleep at night, sweating as well.  Pain Inventory Average Pain 7 Pain Right Now 8 My pain is sharp, burning, dull, stabbing, tingling and aching  In the last 24 hours, has pain interfered with the following? General activity 7 Relation with others 6 Enjoyment of life 7 What TIME of day is your pain at its worst? morning, night Sleep (in general) Poor  Pain is worse with: walking, bending, sitting, inactivity, standing and some activites Pain improves with: rest and medication Relief from Meds: 2  Mobility walk with assistance use a walker ability to climb steps?  no do you drive?  yes Do you have any goals in this area?  no  Function disabled: date disabled . I need assistance with the following:  dressing, bathing, meal prep, household duties and shopping  Neuro/Psych weakness numbness tingling trouble walking dizziness confusion depression anxiety  Prior Studies Any changes since last visit?  no Clinical Data: Back pain with right leg pain  MRI LUMBAR SPINE WITHOUT CONTRAST  Technique:  Multiplanar and multiecho pulse sequences of the lumbar spine were obtained without intravenous  contrast.  Comparison: Lumbar MRI 03/03/2012  Findings: Normal lumbar alignment.  Negative for fracture or mass lesion.  Hemangioma L1 vertebral body unchanged.  Conus medullaris normal and terminates at mid L2.  L1-2:  Negative  L2-3:  Negative  L3-4:  Normal disc.  Mild facet degeneration  L4-5:  Mild disc bulging.  Moderate to advanced facet degeneration with bilateral facet joint effusions and facet hypertrophy.  Mild spinal stenosis, unchanged.  Neural foramina are adequately patent  L5-S1:  Mild disc and mild facet degeneration.  Negative for neural impingement.  Mild foraminal narrowing bilaterally.  IMPRESSION: Disc and facet degeneration at L4-5 with mild spinal stenosis, unchanged  Mild degenerative change L5-S1 with mild foraminal encroachment bilaterally.   Original Report Authenticated By: Carl Best, M.D.  EXAM: LEFT KNEE - 1-2 VIEW  COMPARISON:  None.  FINDINGS: There are mild degenerative changes involving the medial, lateral, and patellofemoral compartments. No joint effusion. No acute fracture. No suspicious lytic or blastic lesions are identified.  IMPRESSION: Mild degenerative changes.  No evidence for acute  abnormality.   Electronically Signed   By: Nolon Nations M.D.   On: 10/16/2014 14:01 CLINICAL DATA:  Pain right knee. No known injury. Initial evaluation  EXAM: RIGHT KNEE - 1-2 VIEW  COMPARISON:  None.  FINDINGS: Patellofemoral degenerative change. No acute bony joint abnormality identified. No evidence fracture dislocation. No knee joint effusion .  IMPRESSION: Mild patellofemoral degenerative change. No acute abnormality identified .   Electronically Signed   By: Marcello Moores  Register   On:  10/16/2014 13:59 Physicians involved in your care Any changes since last visit?  no   Family History  Problem Relation Age of Onset  . Heart attack Mother   . Colon cancer Mother   . Heart attack Father     . Stroke Sister   . Hemophilia Child   . Cancer Unknown   . Coronary artery disease Unknown   . Cancer Brother   . Cancer Maternal Aunt   . Cancer Maternal Uncle   . Cancer Paternal Aunt   . Cancer Paternal Uncle   . Cancer Brother    Social History   Socioeconomic History  . Marital status: Legally Separated    Spouse name: None  . Number of children: 4  . Years of education: None  . Highest education level: None  Social Needs  . Financial resource strain: None  . Food insecurity - worry: None  . Food insecurity - inability: None  . Transportation needs - medical: None  . Transportation needs - non-medical: None  Occupational History  . Occupation: Disabled  Tobacco Use  . Smoking status: Never Smoker  . Smokeless tobacco: Never Used  Substance and Sexual Activity  . Alcohol use: No    Alcohol/week: 0.0 oz  . Drug use: No  . Sexual activity: None  Other Topics Concern  . None  Social History Narrative   Patient does not get regular exercise   Denies caffeine use    Past Surgical History:  Procedure Laterality Date  . ABDOMINAL HYSTERECTOMY    . Arm Surgery Bilateral    due to fall-pt broke both forearms  . BIOPSY N/A 07/01/2013   Procedure: BIOPSY;  Surgeon: Rogene Houston, MD;  Location: [facility];  Service: Endoscopy;  Laterality: N/A;  . CHOLECYSTECTOMY    . COLONOSCOPY  08/06/2011   Procedure: COLONOSCOPY;  Surgeon: Rogene Houston, MD;  Location: [facility];  Service: Endoscopy;  Laterality: N/A;  215  . COLONOSCOPY WITH PROPOFOL N/A 12/31/2012   Procedure: COLONOSCOPY WITH PROPOFOL;  Surgeon: Rogene Houston, MD;  Location: [facility];  Service: Endoscopy;  Laterality: N/A;  in cecum at 0814, total withdrawal time 104mn  . CORONARY ANGIOPLASTY WITH STENT PLACEMENT    . ESOPHAGEAL DILATION N/A 06/07/2015   Procedure: ESOPHAGEAL DILATION;  Surgeon: NRogene Houston MD;  [facility]: [facility];  Service: Endoscopy;  Laterality: N/A;  .  ESOPHAGOGASTRODUODENOSCOPY N/A 06/07/2015   Procedure: ESOPHAGOGASTRODUODENOSCOPY (EGD);  Surgeon: NRogene Houston MD;  [facility]: [facility];  Service: Endoscopy;  Laterality: N/A;  2:45 - moved to 1:00 - Ann to notify pt  . ESOPHAGOGASTRODUODENOSCOPY (EGD) WITH PROPOFOL N/A 12/31/2012   Procedure: ESOPHAGOGASTRODUODENOSCOPY (EGD) WITH PROPOFOL;  Surgeon: NRogene Houston MD;  Location: [facility];  Service: Endoscopy;  Laterality: N/A;  GE junction 37,  . ESOPHAGOGASTRODUODENOSCOPY (EGD) WITH PROPOFOL N/A 07/01/2013   Procedure: ESOPHAGOGASTRODUODENOSCOPY (EGD) WITH PROPOFOL;  Surgeon: NRogene Houston MD;  Location: [facility];  Service: Endoscopy;  Laterality: N/A;  950  . MALONEY DILATION N/A 12/31/2012   Procedure: MALONEY DILATION;  Surgeon: NRogene Houston MD;  Location: [facility];  Service: Endoscopy;  Laterality: N/A;  used a # 54,#56  . MALONEY DILATION N/A 07/01/2013   Procedure: MVenia MinksDILATION;  Surgeon: NRogene Houston MD;  Location: [facility];  Service: Endoscopy;  Laterality: N/A;  54/58; no heme present   Past Medical History:  Diagnosis Date  . Anxiety and depression   . Arthritis   .  CAD (coronary artery disease)    Stent placement circumflex coronary 2007, catheterization 2008 patent stents. Normal LV function  . Chest pain   . CHF (congestive heart failure) ([facility])   . COPD (chronic obstructive pulmonary disease) ([facility])    no home O2  . Depression   . Diabetes mellitus    Insulin dependent  . Diabetic polyneuropathy ([facility])     severe on multiple medications  . Dyslipidemia   . GERD (gastroesophageal reflux disease)   . Headache(784.0)   . 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 Hypertension   . MI (myocardial infarction) ([place])    2007  . Obstructive sleep apnea    CPAP, setting ?2  . Palpitations   . Tachycardia    BP (!) 166/83 (BP Location: Left Arm, Patient Position: Sitting, Cuff Size: Normal)   Pulse 64   Resp 14   SpO2 93%   Opioid Risk Score:   Fall Risk Score:   `1  Depression screen PHQ 2/9  Depression screen [facility] 2/9 04/14/2017 02/17/2017 01/14/2017 12/24/2016 11/11/2016 10/28/2016 08/07/2016  Decreased Interest 3 0 3 0 0 2 0  Down, Depressed, Hopeless 3 0 3 0 0 - 0  PHQ - 2 Score 6 0 6 0 0 2 0  Altered sleeping - - - - - - -  Tired, decreased energy - - - - - - -  Change in appetite - - - - - - -  Feeling bad or failure about yourself  - - - - - - -  Trouble concentrating - - - - - - -  Moving slowly or fidgety/restless - - - - - - -  Suicidal thoughts - - - - - - -  PHQ-9 Score - - - - - - -  Some recent data might be hidden    Review of Systems  Constitutional: Positive for appetite change and diaphoresis.  HENT: Negative.   Eyes: Negative.   Respiratory: Negative.   Cardiovascular: Negative.   Gastrointestinal: Positive for abdominal pain, nausea and vomiting.  Endocrine: Negative.   Genitourinary: Negative.   Musculoskeletal: Positive for arthralgias, back pain and gait problem.  Skin: Positive for rash.  Allergic/Immunologic: Negative.   Neurological: Positive for dizziness, weakness and numbness.       Tingling  Hematological: Negative.   Psychiatric/Behavioral: Positive for confusion and dysphoric mood. The patient is nervous/anxious.        Objective:   Physical Exam  Constitutional: She is oriented to person, place, and time. She appears well-developed and well-nourished.  HENT:  Head: Normocephalic and atraumatic.  Eyes: Conjunctivae and EOM are normal. Pupils are equal, round, and reactive to light.  Neck: Normal range of motion.  Neurological: She is alert and oriented to person, place, and time.  Skin: She is not diaphoretic.  Psychiatric: Her speech is normal. Judgment and thought content normal. Her affect is blunt. She is slowed. She exhibits abnormal remote memory.  Nursing note and vitals reviewed.  Patient is tender in 18/18 fibromyalgia tender points. Motor strength is 5/5 bilateral hip flexor knee extensor  ankle dorsiflexor Negative straight leg raising Ambulates with a walker no evidence of toe drag or knee instability Knees have no evidence of effusion there is tenderness over the VMO bilaterally       Assessment & Plan:  1.  Chronic low back pain as well as chronic bilateral knee pain.  While her symptoms are rated as severe.  Imaging studies show only some mild degenerative  changes.  We discussed that her examination is most consistent with fibromyalgia syndrome.  We also discussed that narcotic analgesics are not very effective for this problem.  Medication such as Cymbalta would be preferred.  She is already on gabapentin at a moderate dose this could either be increased to 600 mg 3 times daily or be weaned and  Lyrica could be used instead. We did discuss that she is on multiple psychiatric medications for depression and anxiety.  My goal would be to wean her off the Klonopin.  I would like a psychiatrist to evaluate her medications and come up with a simplified regimen that would include Cymbalta. Once her psychiatric medications are regulated and we can optimize her fibromyalgia meds then we can wean her opiate dose.  Over half of the 25 min visit was spent counseling and coordinating care.

## 2017-05-15 NOTE — Patient Instructions (Addendum)
Take 1/2 citalopram for 1 wk then stop  Please continue your other meds  I believe much of your pain is fibromyalgia related and will not respond well to morphine We need pschiatry to adjust depression and anxiety meds, then start fibromyalgia meds and then reduce morphine dose THis will be a gradual process

## 2017-05-18 ENCOUNTER — Ambulatory Visit: Payer: Medicaid Other | Admitting: "Endocrinology

## 2017-06-03 ENCOUNTER — Ambulatory Visit (INDEPENDENT_AMBULATORY_CARE_PROVIDER_SITE_OTHER): Payer: Medicaid Other | Admitting: "Endocrinology

## 2017-06-03 ENCOUNTER — Encounter: Payer: Self-pay | Admitting: "Endocrinology

## 2017-06-03 VITALS — BP 168/74 | HR 74 | Ht 64.0 in | Wt 234.0 lb

## 2017-06-03 DIAGNOSIS — E782 Mixed hyperlipidemia: Secondary | ICD-10-CM | POA: Diagnosis not present

## 2017-06-03 DIAGNOSIS — I10 Essential (primary) hypertension: Secondary | ICD-10-CM | POA: Diagnosis not present

## 2017-06-03 DIAGNOSIS — E1159 Type 2 diabetes mellitus with other circulatory complications: Secondary | ICD-10-CM

## 2017-06-03 MED ORDER — INSULIN GLARGINE 100 UNIT/ML SOLOSTAR PEN: 50.0000 [IU] | PEN_INJECTOR | Freq: Every day | SUBCUTANEOUS | 2 refills | Status: DC

## 2017-06-03 NOTE — Patient Instructions (Signed)

## 2017-06-03 NOTE — Progress Notes (Signed)
Subjective:    Patient ID: [name], female    DOB: 1951-02-27. Patient is being seen in f/u for management of diabetes,hyperlipidemia, and hypertension.  Past Medical History:  Diagnosis Date  . Anxiety and depression   . Arthritis   . CAD (coronary artery disease)    Stent placement circumflex coronary 2007, catheterization 2008 patent stents. Normal LV function  . Chest pain   . CHF (congestive heart failure) ([place])   . COPD (chronic obstructive pulmonary disease) ([facility])    no home O2  . Depression   . Diabetes mellitus    Insulin dependent  . Diabetic polyneuropathy ([facility])     severe on multiple medications  . Dyslipidemia   . GERD (gastroesophageal reflux disease)   . Headache(784.0)   . 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 Hypertension   . MI (myocardial infarction) ([place])    2007  . Obstructive sleep apnea    CPAP, setting ?2  . Palpitations   . Tachycardia    Past Surgical History:  Procedure Laterality Date  . ABDOMINAL HYSTERECTOMY    . Arm Surgery Bilateral    due to fall-pt broke both forearms  . BIOPSY N/A 07/01/2013   Procedure: BIOPSY;  Surgeon: Rogene Houston, MD;  Location: [facility];  Service: Endoscopy;  Laterality: N/A;  . CHOLECYSTECTOMY    . COLONOSCOPY  08/06/2011   Procedure: COLONOSCOPY;  Surgeon: Rogene Houston, MD;  Location: [facility];  Service: Endoscopy;  Laterality: N/A;  215  . COLONOSCOPY WITH PROPOFOL N/A 12/31/2012   Procedure: COLONOSCOPY WITH PROPOFOL;  Surgeon: Rogene Houston, MD;  Location: [facility];  Service: Endoscopy;  Laterality: N/A;  in cecum at 0814, total withdrawal time 70min  . CORONARY ANGIOPLASTY WITH STENT PLACEMENT    . ESOPHAGEAL DILATION N/A 06/07/2015   Procedure: ESOPHAGEAL DILATION;  Surgeon: Rogene Houston, MD;  [facility];  Service: Endoscopy;  Laterality: N/A;  . ESOPHAGOGASTRODUODENOSCOPY N/A 06/07/2015   Procedure: ESOPHAGOGASTRODUODENOSCOPY (EGD);  Surgeon: Rogene Houston, MD;  [facility];  Service: Endoscopy;  Laterality: N/A;  2:45 - moved to 1:00 - Ann to notify pt  . ESOPHAGOGASTRODUODENOSCOPY (EGD) WITH PROPOFOL N/A 12/31/2012   Procedure: ESOPHAGOGASTRODUODENOSCOPY (EGD) WITH PROPOFOL;  Surgeon: Rogene Houston, MD;  Location: [facility];  Service: Endoscopy;  Laterality: N/A;  GE junction 37,  . ESOPHAGOGASTRODUODENOSCOPY (EGD) WITH PROPOFOL N/A 07/01/2013   Procedure: ESOPHAGOGASTRODUODENOSCOPY (EGD) WITH PROPOFOL;  Surgeon: Rogene Houston, MD;  Location: [facility];  Service: Endoscopy;  Laterality: N/A;  950  . MALONEY DILATION N/A 12/31/2012   Procedure: MALONEY DILATION;  Surgeon: Rogene Houston, MD;  Location: [facility];  Service: Endoscopy;  Laterality: N/A;  used a # 54,#56  . MALONEY DILATION N/A 07/01/2013   Procedure: Venia Minks DILATION;  Surgeon: Rogene Houston, MD;  Location: [facility];  Service: Endoscopy;  Laterality: N/A;  54/58; no heme present   Social History   Socioeconomic History  . Marital status: Legally Separated    Spouse name: Not on file  . Number of children: 4  . Years of education: Not on file  . Highest education level: Not on file  Social Needs  . Financial resource strain: Not on file  . Food insecurity - worry: Not on file  . Food insecurity - inability: Not on file  . Transportation needs - medical: Not on file  . Transportation needs - non-medical: Not on file  Occupational  History  . Occupation: Disabled  Tobacco Use  . Smoking status: Never Smoker  . Smokeless tobacco: Never Used  Substance and Sexual Activity  . Alcohol use: No    Alcohol/week: 0.0 oz  . Drug use: No  . Sexual activity: Not on file  Other Topics Concern  . Not on file  Social History Narrative   Patient does not get regular exercise   Denies caffeine use    Outpatient Encounter Medications as of 06/03/2017  Medication Sig  . ACCU-CHEK AVIVA PLUS test strip USE TO CHECK BLOOD SUGAR THREE TIMES DAILY - DX: E11.9  . aspirin EC 81 MG tablet Take 81 mg by  mouth daily.  Marland Kitchen atorvastatin (LIPITOR) 40 MG tablet Take 40 mg by mouth daily.  . beclomethasone (QVAR) 40 MCG/ACT inhaler Inhale 1 puff into the lungs 2 (two) times daily.  Marland Kitchen buPROPion (WELLBUTRIN SR) 100 MG 12 hr tablet Take 100 mg by mouth daily.  . citalopram (CELEXA) 40 MG tablet TAKE ONE TABLET BY MOUTH EVERY EVENING (CORRECTED DOSE)  . clonazePAM (KLONOPIN) 0.5 MG tablet Take 1 tablet (0.5 mg total) by mouth 2 (two) times daily as needed for anxiety.  . diclofenac sodium (VOLTAREN) 1 % GEL APPLY TO BOTH KNEES THREE TIMES DAILY  . DULoxetine (CYMBALTA) 30 MG capsule Take 1 capsule (30 mg total) by mouth daily.  . DUREZOL 0.05 % EMUL INSTILL ONE DROP INTO THE RIGHT EYE THREE TIMES DAILY, TAPER OVER THREE WEEKS  . estrogens, conjugated, (PREMARIN) 1.25 MG tablet Take 1.25 mg by mouth daily.   . fluticasone-salmeterol (ADVAIR HFA) 115-21 MCG/ACT inhaler Inhale 2 puffs into the lungs 2 (two) times daily.  . furosemide (LASIX) 20 MG tablet Take 20 mg by mouth daily as needed.  . gabapentin (NEURONTIN) 400 MG capsule Take 400 mg by mouth 3 (three) times daily.  Marland Kitchen GLOBAL EASE INJECT PEN NEEDLES 31G X 8 MM MISC USE AS DIRECTED FOUR TIMES DAILY  . Insulin Glargine (LANTUS SOLOSTAR) 100 UNIT/ML Solostar Pen Inject 50 Units into the skin at bedtime.  . lidocaine (XYLOCAINE) 5 % ointment Apply 1 application topically at bedtime.   . meloxicam (MOBIC) 7.5 MG tablet Take 1 tablet (7.5 mg total) by mouth daily.  . metFORMIN (GLUCOPHAGE) 500 MG tablet TAKE ONE TABLET BY MOUTH TWICE DAILY with a meal  . methocarbamol (ROBAXIN) 500 MG tablet Take 1 tablet (500 mg total) by mouth 3 (three) times daily.  . metoprolol succinate (TOPROL-XL) 50 MG 24 hr tablet TAKE 1 AND 1/2 TABLETS BY MOUTH EVERY DAY  . montelukast (SINGULAIR) 10 MG tablet Take 10 mg by mouth daily.  Marland Kitchen morphine (MS CONTIN) 30 MG 12 hr tablet Take 1 tablet (30 mg total) by mouth every 8 (eight) hours.  . Multiple Vitamin (MULTIVITAMIN WITH  MINERALS) TABS tablet Take 1 tablet by mouth daily.  . nitroGLYCERIN (NITROSTAT) 0.4 MG SL tablet Place 1 tablet (0.4 mg total) under the tongue every 5 (five) minutes x 3 doses as needed. For chest pains  . ondansetron (ZOFRAN) 4 MG tablet Take 4 mg by mouth every 6 (six) hours as needed for nausea or vomiting.  . pantoprazole (PROTONIX) 40 MG tablet TAKE ONE TABLET BY MOUTH TWICE DAILY BEFORE MEALS  . potassium chloride (K-DUR,KLOR-CON) 10 MEQ tablet Take 10 mEq by mouth 2 (two) times daily.  Marland Kitchen PROAIR HFA 108 (90 Base) MCG/ACT inhaler inhale TWO puffs FOUR TIMES DAILY AS NEEDED  . promethazine (PHENERGAN) 25 MG tablet Take 25  mg by mouth 4 (four) times daily as needed.  . psyllium (METAMUCIL SMOOTH TEXTURE) 28 % packet Take 1 packet by mouth at bedtime.  . ranitidine (ZANTAC) 300 MG tablet Take 1 tablet (300 mg total) by mouth at bedtime as needed for heartburn.  . topiramate (TOPAMAX) 200 MG tablet Take 200 mg by mouth at bedtime.  . [DISCONTINUED] diltiazem (TIAZAC) 180 MG 24 hr capsule Take 1 capsule (180 mg total) by mouth daily.  . [DISCONTINUED] DULoxetine (CYMBALTA) 30 MG capsule TAKE ONE CAPSULE BY MOUTH EVERY DAY  . [DISCONTINUED] LANTUS SOLOSTAR 100 UNIT/ML Solostar Pen INJECT 50 UNITS INTO THE SKIN AT BEDTIME. (Patient taking differently: Inject 40 Units into the skin at bedtime. )  . [DISCONTINUED] potassium chloride (K-DUR) 10 MEQ tablet Take 2 tablets (20 mEq total) by mouth 2 (two) times daily.   No facility-administered encounter medications on file as of 06/03/2017.    ALLERGIES: Allergies  Allergen Reactions  . Nitrofuran Derivatives Itching and Swelling  . Amphetamine-Dextroamphet Er Swelling  . Pregabalin Swelling  . Topiramate Other (See Comments)    Tongue tingle  . Verelan [Verapamil] Rash   VACCINATION STATUS:  There is no immunization history on file for this patient.  Diabetes  She presents for her follow-up diabetic visit. She has type 2 diabetes mellitus.  Onset time: She was diagnosed at approximate age of 67 years. Her disease course has been worsening. There are no hypoglycemic associated symptoms. Pertinent negatives for hypoglycemia include no confusion, headaches, pallor or seizures. Associated symptoms include fatigue, polydipsia and polyuria. Pertinent negatives for diabetes include no chest pain and no polyphagia. There are no hypoglycemic complications. Symptoms are worsening. Diabetic complications include autonomic neuropathy, heart disease, peripheral neuropathy, PVD and retinopathy. Risk factors for coronary artery disease include dyslipidemia, diabetes mellitus, obesity, hypertension and sedentary lifestyle. Current diabetic treatments: Lantus 65 units daily at bedtime, Humalog 15 units 3 times a day before meals. She mentions allergy to Victoza, Byetta, and metformin. Her weight is increasing steadily. She is following a generally unhealthy diet. When asked about meal planning, she reported none. She has not had a previous visit with a dietitian. She never participates in exercise. Her breakfast blood glucose range is generally 140-180 mg/dl. Her overall blood glucose range is 140-180 mg/dl. An ACE inhibitor/angiotensin II receptor blocker is not being taken. Eye exam is current.  Hyperlipidemia  This is a chronic problem. The current episode started more than 1 year ago. The problem is uncontrolled. Recent lipid tests were reviewed and are high. Exacerbating diseases include diabetes and obesity. Pertinent negatives include no chest pain, myalgias or shortness of breath. Current antihyperlipidemic treatment includes statins. Risk factors for coronary artery disease include diabetes mellitus, dyslipidemia, hypertension, obesity and a sedentary lifestyle.  Hypertension  This is a chronic problem. The current episode started more than 1 year ago. Pertinent negatives include no chest pain, headaches, palpitations or shortness of breath. Risk factors  for coronary artery disease include diabetes mellitus, dyslipidemia, obesity and sedentary lifestyle. Hypertensive end-organ damage includes CAD/MI, PVD and retinopathy.    Review of Systems  Constitutional: Positive for fatigue. Negative for chills, fever and unexpected weight change.  HENT: Negative for trouble swallowing and voice change.   Eyes: Negative for visual disturbance.  Respiratory: Negative for cough, chest tightness, shortness of breath and wheezing.   Cardiovascular: Negative for chest pain, palpitations and leg swelling.  Gastrointestinal: Negative for diarrhea, nausea and vomiting.  Endocrine: Positive for polydipsia and polyuria. Negative for  cold intolerance, heat intolerance and polyphagia.  Musculoskeletal: Negative for arthralgias and myalgias.  Skin: Negative for color change, pallor, rash and wound.  Neurological: Negative for seizures and headaches.  Psychiatric/Behavioral: Negative for confusion and suicidal ideas.    Objective:    BP (!) 168/74   Pulse 74   Ht 5\' 4"  (1.626 m)   Wt 234 lb (106.1 kg)   BMI 40.17 kg/m   Wt Readings from Last 3 Encounters:  06/03/17 234 lb (106.1 kg)  03/27/17 232 lb 3.2 oz (105.3 kg)  02/17/17 231 lb 4.8 oz (104.9 kg)    Physical Exam  Constitutional: She is oriented to person, place, and time. She appears well-developed.  HENT:  Head: Normocephalic and atraumatic.  She has very poor dentition.  Eyes: EOM are normal.  Neck: Normal range of motion. Neck supple. No tracheal deviation present. No thyromegaly present.  Cardiovascular: Normal rate and regular rhythm.  No reproducible chest wall tenderness.  Pulmonary/Chest: Effort normal and breath sounds normal.  Abdominal: Soft. Bowel sounds are normal. There is no tenderness. There is no guarding.  Musculoskeletal: Normal range of motion. She exhibits no edema.  Diabetes foot exam is abnormal with calluses, diminished monofilament sensation, dry skin, diminished to  absent peripheral pulses, and long nails.  She follows with podiatry for nail clipping.  Neurological: She is alert and oriented to person, place, and time. She has normal reflexes. No cranial nerve deficit. Coordination normal.  Skin: Skin is warm and dry. No rash noted. No erythema. No pallor.  Psychiatric: She has a normal mood and affect. Judgment normal.    CMP     Component Value Date/Time   NA 140 05/11/2017 1125   NA 143 11/06/2016 1112   K 4.6 05/11/2017 1125   CL 107 05/11/2017 1125   CO2 26 05/11/2017 1125   GLUCOSE 230 (H) 05/11/2017 1125   BUN 10 05/11/2017 1125   BUN 11 11/06/2016 1112   CREATININE 0.73 05/11/2017 1125   CALCIUM 8.5 (L) 05/11/2017 1125   PROT 5.6 (L) 05/11/2017 1125   PROT 5.5 (L) 11/06/2016 1112   ALBUMIN 3.4 (L) 11/06/2016 1112   AST 8 (L) 05/11/2017 1125   ALT 8 05/11/2017 1125   ALKPHOS 76 11/06/2016 1112   BILITOT 0.4 05/11/2017 1125   BILITOT 0.3 11/06/2016 1112   GFRNONAA 87 05/11/2017 1125   GFRAA 101 05/11/2017 1125   Diabetic Labs (most recent): Lab Results  Component Value Date   HGBA1C 8.2 (H) 05/11/2017   HGBA1C 7.8 (H) 02/09/2017   HGBA1C 7.6 (H) 07/31/2016    Lipid Panel     Component Value Date/Time   CHOL 151 02/18/2017 0528   CHOL 184 04/15/2016 1121   TRIG 439 (H) 02/18/2017 0528   HDL 46 02/18/2017 0528   HDL 77 04/15/2016 1121   CHOLHDL 3.3 02/18/2017 0528   VLDL UNABLE TO CALCULATE IF TRIGLYCERIDE OVER 400 mg/dL 02/18/2017 0528   LDLCALC UNABLE TO CALCULATE IF TRIGLYCERIDE OVER 400 mg/dL 02/18/2017 0528   LDLCALC 50 04/15/2016 1121     Assessment & Plan:   1. Type 2 diabetes mellitus with vascular disease ([place]) - Patient had chest pain for 2 hours despite high-dose of nitroglycerin. She has prior history of myocardial infarction. She is sent to emergency room for better evaluation. - Patient has currently uncontrolled symptomatic type 2 DM since  67 years of age. - She came with above target blood glucose  profile, A1c of 8.2% increasing from 7.8%.  Recent labs reviewed.  Her diabetes is complicated by coronary artery disease status post stent placement and patient remains at a high risk for more acute and chronic complications of diabetes which include CAD, CVA, CKD, retinopathy, and neuropathy. These are all discussed in detail with the patient.  - I have counseled the patient on diet management and weight loss, by adopting a carbohydrate restricted/protein rich diet.  -  Suggestion is made for her to avoid simple carbohydrates  from her diet including Cakes, Sweet Desserts / Pastries, Ice Cream, Soda (diet and regular), Sweet Tea, Candies, Chips, Cookies, Store Bought Juices, Alcohol in Excess of  1-2 drinks a day, Artificial Sweeteners, and "Sugar-free" Products. This will help patient to have stable blood glucose profile and potentially avoid unintended weight gain.  - I encouraged the patient to switch to  unprocessed or minimally processed complex starch and increased protein intake (animal or plant source), fruits, and vegetables.  - Patient is advised to stick to a routine mealtimes to eat 3 meals  a day and avoid unnecessary snacks ( to snack only to correct hypoglycemia).   - I have approached patient with the following individualized plan to manage diabetes and patient agrees:   - She has normal renal function. -She will tolerate more insulin.  I discussed and increase her Lantus to 50 units nightly associated with monitoring of blood glucose at least daily before breakfast and anytime as needed.  - Patient is warned not to take insulin without proper monitoring per orders. -Adjustment parameters are given for hypo and hyperglycemia in writing. -Patient is encouraged to call clinic for blood glucose levels less than 70 or above 200 mg /dl. -She mentions allergy to a number of use for medications unfortunately, Victoza, Byetta. -She will continue on metformin 500 mg p.o. twice daily.      - Patient specific target  A1c;  LDL, HDL, Triglycerides, and  Waist Circumference were discussed in detail.  2) BP/HTN: Her blood pressure is not controlled to target.  She has adequate medications, advised to continue.   3) Lipids/HPL:  Uncontrolled with triglycerides   still high at 284, LDL better at 50, HDL at 77. Advised her to continue  Lipitor 40 mg by mouth daily at bedtime. Better control of diabetes will help for triglycerides. 4)  Weight/Diet: CDE Consult in progress  , exercise, and detailed carbohydrates information provided.  5) Chronic Care/Health Maintenance:  -Patient is on Statin medications and encouraged to continue to follow up with Ophthalmology, Podiatrist at least yearly or according to recommendations, and advised to   stay away from smoking. I have recommended yearly flu vaccine and pneumonia vaccination at least every 5 years; moderate intensity exercise for up to 150 minutes weekly; and  sleep for at least 7 hours a day. -She will benefit from a set of diabetic shoes, paperwork filled for her. - I advised patient to maintain close follow up with Glenda Chroman, MD for primary care needs. - Time spent with the patient: 25 min, of which >50% was spent in reviewing her blood glucose logs , discussing her hypo- and hyper-glycemic episodes, reviewing her current and  previous labs and insulin doses and developing a plan to avoid hypo- and hyper-glycemia. Please refer to Patient Instructions for Blood Glucose Monitoring and Insulin/Medications Dosing Guide"  in media tab for additional information. [name] participated in the discussions, expressed understanding, and voiced agreement with the above plans.  All questions were answered to her  satisfaction. she is encouraged to contact clinic should she have any questions or concerns prior to her return visit.  Follow up plan: - Patient to go to emergency room for better evaluation of chest pain. Return in about 4  months (around 10/03/2017) for follow up with pre-visit labs, meter, and logs.  Glade Lloyd, MD Phone: [phone]  Fax: [phone]  This note was partially dictated with voice recognition software. Similar sounding words can be transcribed inadequately or may not  be corrected upon review.  06/03/2017, 1:35 PM

## 2017-06-09 ENCOUNTER — Telehealth: Payer: Self-pay | Admitting: "Endocrinology

## 2017-06-09 NOTE — Telephone Encounter (Signed)
[name] is stating that her blood sugar is running high  Sat 03/23 143  Sun 03/24 137  Mon 03/25  200  Tues 03/26 202  Please advise of any changes

## 2017-06-09 NOTE — Telephone Encounter (Signed)
Pt notiified by VM

## 2017-06-09 NOTE — Telephone Encounter (Signed)
She may increase her Lantus to 60 units nightly, continue to monitor blood glucose 2 times a day-before breakfast and at bedtime and report if < 70 or >200 x3.

## 2017-06-12 ENCOUNTER — Encounter (HOSPITAL_BASED_OUTPATIENT_CLINIC_OR_DEPARTMENT_OTHER): Payer: Medicaid Other | Admitting: Registered Nurse

## 2017-06-12 ENCOUNTER — Other Ambulatory Visit: Payer: Self-pay

## 2017-06-12 ENCOUNTER — Encounter: Payer: Self-pay | Admitting: Registered Nurse

## 2017-06-12 VITALS — BP 124/71 | HR 86 | Ht 62.0 in | Wt 233.0 lb

## 2017-06-12 DIAGNOSIS — M7061 Trochanteric bursitis, right hip: Secondary | ICD-10-CM | POA: Diagnosis not present

## 2017-06-12 DIAGNOSIS — G894 Chronic pain syndrome: Secondary | ICD-10-CM | POA: Diagnosis not present

## 2017-06-12 DIAGNOSIS — Z760 Encounter for issue of repeat prescription: Secondary | ICD-10-CM | POA: Diagnosis not present

## 2017-06-12 DIAGNOSIS — Z79899 Other long term (current) drug therapy: Secondary | ICD-10-CM | POA: Diagnosis not present

## 2017-06-12 DIAGNOSIS — M5416 Radiculopathy, lumbar region: Secondary | ICD-10-CM | POA: Diagnosis not present

## 2017-06-12 DIAGNOSIS — M1712 Unilateral primary osteoarthritis, left knee: Secondary | ICD-10-CM

## 2017-06-12 DIAGNOSIS — M1711 Unilateral primary osteoarthritis, right knee: Secondary | ICD-10-CM

## 2017-06-12 DIAGNOSIS — F411 Generalized anxiety disorder: Secondary | ICD-10-CM | POA: Diagnosis not present

## 2017-06-12 DIAGNOSIS — E1142 Type 2 diabetes mellitus with diabetic polyneuropathy: Secondary | ICD-10-CM | POA: Diagnosis not present

## 2017-06-12 DIAGNOSIS — M48061 Spinal stenosis, lumbar region without neurogenic claudication: Secondary | ICD-10-CM | POA: Diagnosis not present

## 2017-06-12 DIAGNOSIS — Z5181 Encounter for therapeutic drug level monitoring: Secondary | ICD-10-CM | POA: Diagnosis not present

## 2017-06-12 DIAGNOSIS — F329 Major depressive disorder, single episode, unspecified: Secondary | ICD-10-CM

## 2017-06-12 DIAGNOSIS — M7062 Trochanteric bursitis, left hip: Secondary | ICD-10-CM | POA: Diagnosis not present

## 2017-06-12 MED ORDER — MORPHINE SULFATE ER 30 MG PO TBCR: 30.0000 mg | EXTENDED_RELEASE_TABLET | Freq: Three times a day (TID) | ORAL | 0 refills | Status: DC

## 2017-06-12 NOTE — Progress Notes (Signed)
Subjective:    Patient ID: [name], female    DOB: [birth year], 67 y.o.   MRN: [id number]  HPI: [name] is a 67year old female who returns for follow up appointmentfor chronic pain and medication refill. She states her pain is located in her bilateral shoulders, lower back radiating into her bilateral lower extremities, bilateral hips, bilateral knees and burning and tingling in her bilateral feet. She rates her pain 8. Her current exercise regime is walking short distances with walker.  Ms. Frater Morphine equivalent is 87.00 MME.  She was prescribed Klonopin, she seen Psychiatrist at [place] and [place], her Klonopin is being taper. She was instructed to decrease klonopin to 0.5mg  daily for one week and then discontinued. She will continue with this medication regimen:Per: Noemi Chapel [facility]-NP-BC Wellbutrin XL 150 mg daily in the morning Trazodone 150 mg  Hydroxyzine 10 mg 4 times daily as needed for anxiety   We have reviewedthe black box warning again regarding using opioids and benzodiazepines while on Klonopin. I highlighted the dangers of using these drugs together and discussed the adverse events including respiratory suppression, overdose, cognitive impairment and importance of  compliance with current regimen. She verbalizes understanding, we will continue to monitor and adjust as indicated.     UDS was performed on 03/25/2017 it was consistent.   Pain Inventory Average Pain 8 Pain Right Now 8 My pain is sharp, burning, dull, stabbing, tingling and aching  In the last 24 hours, has pain interfered with the following? General activity 7 Relation with others 7 Enjoyment of life 7 What TIME of day is your pain at its worst? morning night Sleep (in general) Poor  Pain is worse with: walking, bending, sitting, inactivity, standing and some activites Pain improves with: heat/ice and medication Relief from Meds: 2  Mobility walk with  assistance use a walker how many minutes can you walk? 5 ability to climb steps?  no do you drive?  yes needs help with transfers Do you have any goals in this area?  no  Function disabled: date disabled n/a I need assistance with the following:  dressing, bathing, toileting, meal prep, household duties and shopping Do you have any goals in this area?  no  Neuro/Psych bladder control problems weakness numbness tingling trouble walking spasms dizziness confusion depression anxiety  Prior Studies Any changes since last visit?  no  Physicians involved in your care Any changes since last visit?  no   Family History  Problem Relation Age of Onset  . Heart attack Mother   . Colon cancer Mother   . Heart attack Father   . Stroke Sister   . Hemophilia Child   . Cancer Unknown   . Coronary artery disease Unknown   . Cancer Brother   . Cancer Maternal Aunt   . Cancer Maternal Uncle   . Cancer Paternal Aunt   . Cancer Paternal Uncle   . Cancer Brother    Social History   Socioeconomic History  . Marital status: Legally Separated    Spouse name: Not on file  . Number of children: 4  . Years of education: Not on file  . Highest education level: Not on file  Occupational History  . Occupation: Disabled  Social Needs  . Financial resource strain: Not on file  . Food insecurity:    Worry: Not on file    Inability: Not on file  . Transportation needs:    Medical: Not on  file    Non-medical: Not on file  Tobacco Use  . Smoking status: Never Smoker  . Smokeless tobacco: Never Used  Substance and Sexual Activity  . Alcohol use: No    Alcohol/week: 0.0 oz  . Drug use: No  . Sexual activity: Not on file  Lifestyle  . Physical activity:    Days per week: Not on file    Minutes per session: Not on file  . Stress: Not on file  Relationships  . Social connections:    Talks on phone: Not on file    Gets together: Not on file    Attends religious service: Not on  file    Active member of club or organization: Not on file    Attends meetings of clubs or organizations: Not on file    Relationship status: Not on file  Other Topics Concern  . Not on file  Social History Narrative   Patient does not get regular exercise   Denies caffeine use    Past Surgical History:  Procedure Laterality Date  . ABDOMINAL HYSTERECTOMY    . Arm Surgery Bilateral    due to fall-pt broke both forearms  . BIOPSY N/A 07/01/2013   Procedure: BIOPSY;  Surgeon: Rogene Houston, MD;  Location: [facility];  Service: Endoscopy;  Laterality: N/A;  . CHOLECYSTECTOMY    . COLONOSCOPY  08/06/2011   Procedure: COLONOSCOPY;  Surgeon: Rogene Houston, MD;  [facility];  Service: Endoscopy;  Laterality: N/A;  215  . COLONOSCOPY WITH PROPOFOL N/A 12/31/2012   Procedure: COLONOSCOPY WITH PROPOFOL;  Surgeon: Rogene Houston, MD;  Location: [facility];  Service: Endoscopy;  Laterality: N/A;  in cecum at 0814, total withdrawal time 51min  . CORONARY ANGIOPLASTY WITH STENT PLACEMENT    . ESOPHAGEAL DILATION N/A 06/07/2015   Procedure: ESOPHAGEAL DILATION;  Surgeon: Rogene Houston, MD;  [facility];  Service: Endoscopy;  Laterality: N/A;  . ESOPHAGOGASTRODUODENOSCOPY N/A 06/07/2015   Procedure: ESOPHAGOGASTRODUODENOSCOPY (EGD);  Surgeon: Rogene Houston, MD;  [facility];  Service: Endoscopy;  Laterality: N/A;  2:45 - moved to 1:00 - Ann to notify pt  . ESOPHAGOGASTRODUODENOSCOPY (EGD) WITH PROPOFOL N/A 12/31/2012   Procedure: ESOPHAGOGASTRODUODENOSCOPY (EGD) WITH PROPOFOL;  Surgeon: Rogene Houston, MD;  Location: [facility];  Service: Endoscopy;  Laterality: N/A;  GE junction 37,  . ESOPHAGOGASTRODUODENOSCOPY (EGD) WITH PROPOFOL N/A 07/01/2013   Procedure: ESOPHAGOGASTRODUODENOSCOPY (EGD) WITH PROPOFOL;  Surgeon: Rogene Houston, MD;  Location: [facility];  Service: Endoscopy;  Laterality: N/A;  950  . MALONEY DILATION N/A 12/31/2012   Procedure: MALONEY DILATION;   Surgeon: Rogene Houston, MD;  Location: [facility];  Service: Endoscopy;  Laterality: N/A;  used a # 54,#56  . MALONEY DILATION N/A 07/01/2013   Procedure: Venia Minks DILATION;  Surgeon: Rogene Houston, MD;  Location: [facility];  Service: Endoscopy;  Laterality: N/A;  54/58; no heme present   Past Medical History:  Diagnosis Date  . Anxiety and depression   . Arthritis   . CAD (coronary artery disease)    Stent placement circumflex coronary 2007, catheterization 2008 patent stents. Normal LV function  . Chest pain   . CHF (congestive heart failure) ([facility])   . COPD (chronic obstructive pulmonary disease) ([facility])    no home O2  . Depression   . Diabetes mellitus    Insulin dependent  . Diabetic polyneuropathy ([facility])     severe on multiple medications  .  Dyslipidemia   . GERD (gastroesophageal reflux disease)   . Headache(784.0)   . 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 Hypertension   . MI (myocardial infarction) ([place])    2007  . Obstructive sleep apnea    CPAP, setting ?2  . Palpitations   . Tachycardia    BP 124/71   Pulse 86   Ht 5\' 2"  (1.575 m) Comment: pt reported  Wt 233 lb (105.7 kg)   SpO2 92%   BMI 42.62 kg/m   Opioid Risk Score:  1 Fall Risk Score:  `1  Depression screen PHQ 2/9  Depression screen [facility] 2/9 06/12/2017 04/14/2017 02/17/2017 01/14/2017 12/24/2016 11/11/2016 10/28/2016  Decreased Interest 3 3 0 3 0 0 2  Down, Depressed, Hopeless 3 3 0 3 0 0 -  PHQ - 2 Score 6 6 0 6 0 0 2  Altered sleeping 3 - - - - - -  Tired, decreased energy 3 - - - - - -  Change in appetite 0 - - - - - -  Feeling bad or failure about yourself  3 - - - - - -  Trouble concentrating 3 - - - - - -  Moving slowly or fidgety/restless 1 - - - - - -  Suicidal thoughts 0 - - - - - -  PHQ-9 Score 19 - - - - - -  Difficult doing work/chores Very difficult - - - - - -  Some recent data might be hidden   Review of Systems  Constitutional: Positive for diaphoresis.  HENT: Negative.   Eyes: Negative.     Respiratory: Positive for apnea and shortness of breath.   Gastrointestinal: Positive for nausea and vomiting.  Endocrine: Negative.        High and low blood sugar  Genitourinary: Positive for dysuria.       Bladder control  Musculoskeletal: Positive for arthralgias, back pain and gait problem.       Spasms  Allergic/Immunologic: Negative.   Neurological: Positive for dizziness, weakness and numbness.       Tingling  Hematological: Negative.   Psychiatric/Behavioral: Positive for confusion and dysphoric mood. The patient is nervous/anxious.   All other systems reviewed and are negative.      Objective:   Physical Exam  Constitutional: She is oriented to person, place, and time. She appears well-developed and well-nourished.  HENT:  Head: Normocephalic and atraumatic.  Neck: Normal range of motion. Neck supple.  Cardiovascular: Normal rate and regular rhythm.  Pulmonary/Chest: Effort normal and breath sounds normal.  Musculoskeletal:  Normal Muscle Bulk and Muscle Testing Reveals:  Upper Extremities: Full ROM and Muscle Strength 5/5 Thoracic and Lumbar Hypersensitivity Lumbar Hypersensitivity Bilateral  Greater Trochanter Tenderness: R>L Lower Extremities: Full ROM and Muscle Strength 5/5 Bilateral Lower Extremities Flexion Produces Pain into her Lumbar, Bilateral Hips and Bilateral Lower Extremities Arises from Table slowly using walker for support Narrow Based Gait  Neurological: She is alert and oriented to person, place, and time.  Skin: Skin is warm and dry.  Psychiatric: She has a normal mood and affect.  Nursing note and vitals reviewed.         Assessment & Plan:  1.Lumbar pain lumbar spondylosis: Encouraged to increase Activity as tolerated. 06/12/2017 Refilled: MS Contin 30 MG one tablet every 8 hours #90. We will continue the opioid monitoring program, this consists of regular clinic visits, examinations, urine drug screen, pill counts as well as use of  [place] Controlled Substance Reporting System.  2. Lumbar Radiculitis: Continue Gabapentin. Will re-assess next month to evaluate Gabapentin 600 mg . She verbalizes understanding. 06/12/2017 3. Bilateral Knee Pain/ Degenerative: Continue Voltaren Gel. Continue to Monitor. 06/12/2017. 4. Severe diabetic poly neuropathy: Continue Gabapentin. 06/12/2017 5. Insomnia: ContinueTrazodone PsychiatryFollowing. 06/12/2017 6. Anxiety/ Depression: Continue Cymbalta and Klonopin being taper by psychiatry. Hydroxyzine prescribed . 06/12/2017 7. De Quervains Tenosynovitis: No Complaints Today. 06/12/2017 8. Bilateral Greater Trochanteric Bursitis: Continue with Ice and Heat Therapy. Continue to Monitor. 06/12/2017.  20  minutes of face to face patient care time was spent during this visit. All questions were encouraged and answered.  F/U in 1 month

## 2017-06-23 ENCOUNTER — Telehealth: Payer: Self-pay | Admitting: *Deleted

## 2017-06-23 NOTE — Telephone Encounter (Signed)
Prior Authorization submitted and  Approved for morphine sulfate ER 30 mg

## 2017-07-07 ENCOUNTER — Encounter: Payer: Self-pay | Admitting: Registered Nurse

## 2017-07-07 ENCOUNTER — Encounter: Payer: Medicaid Other | Attending: Physical Medicine & Rehabilitation | Admitting: Registered Nurse

## 2017-07-07 VITALS — BP 138/72 | HR 68 | Ht 62.0 in | Wt 238.6 lb

## 2017-07-07 DIAGNOSIS — I509 Heart failure, unspecified: Secondary | ICD-10-CM | POA: Diagnosis not present

## 2017-07-07 DIAGNOSIS — Z79899 Other long term (current) drug therapy: Secondary | ICD-10-CM

## 2017-07-07 DIAGNOSIS — I252 Old myocardial infarction: Secondary | ICD-10-CM | POA: Insufficient documentation

## 2017-07-07 DIAGNOSIS — G4700 Insomnia, unspecified: Secondary | ICD-10-CM | POA: Diagnosis not present

## 2017-07-07 DIAGNOSIS — M79604 Pain in right leg: Secondary | ICD-10-CM | POA: Insufficient documentation

## 2017-07-07 DIAGNOSIS — M1990 Unspecified osteoarthritis, unspecified site: Secondary | ICD-10-CM | POA: Diagnosis not present

## 2017-07-07 DIAGNOSIS — F411 Generalized anxiety disorder: Secondary | ICD-10-CM

## 2017-07-07 DIAGNOSIS — Z1401 Asymptomatic hemophilia A carrier: Secondary | ICD-10-CM | POA: Diagnosis not present

## 2017-07-07 DIAGNOSIS — M47896 Other spondylosis, lumbar region: Secondary | ICD-10-CM | POA: Insufficient documentation

## 2017-07-07 DIAGNOSIS — G8929 Other chronic pain: Secondary | ICD-10-CM | POA: Insufficient documentation

## 2017-07-07 DIAGNOSIS — J449 Chronic obstructive pulmonary disease, unspecified: Secondary | ICD-10-CM | POA: Diagnosis not present

## 2017-07-07 DIAGNOSIS — K219 Gastro-esophageal reflux disease without esophagitis: Secondary | ICD-10-CM | POA: Insufficient documentation

## 2017-07-07 DIAGNOSIS — I2510 Atherosclerotic heart disease of native coronary artery without angina pectoris: Secondary | ICD-10-CM | POA: Insufficient documentation

## 2017-07-07 DIAGNOSIS — Z760 Encounter for issue of repeat prescription: Secondary | ICD-10-CM | POA: Diagnosis present

## 2017-07-07 DIAGNOSIS — F418 Other specified anxiety disorders: Secondary | ICD-10-CM | POA: Diagnosis not present

## 2017-07-07 DIAGNOSIS — M79605 Pain in left leg: Secondary | ICD-10-CM | POA: Diagnosis not present

## 2017-07-07 DIAGNOSIS — Z794 Long term (current) use of insulin: Secondary | ICD-10-CM | POA: Diagnosis not present

## 2017-07-07 DIAGNOSIS — M48061 Spinal stenosis, lumbar region without neurogenic claudication: Secondary | ICD-10-CM

## 2017-07-07 DIAGNOSIS — E1142 Type 2 diabetes mellitus with diabetic polyneuropathy: Secondary | ICD-10-CM | POA: Diagnosis not present

## 2017-07-07 DIAGNOSIS — G894 Chronic pain syndrome: Secondary | ICD-10-CM

## 2017-07-07 DIAGNOSIS — R002 Palpitations: Secondary | ICD-10-CM | POA: Diagnosis not present

## 2017-07-07 DIAGNOSIS — R079 Chest pain, unspecified: Secondary | ICD-10-CM | POA: Diagnosis not present

## 2017-07-07 DIAGNOSIS — M1711 Unilateral primary osteoarthritis, right knee: Secondary | ICD-10-CM | POA: Diagnosis not present

## 2017-07-07 DIAGNOSIS — M5416 Radiculopathy, lumbar region: Secondary | ICD-10-CM | POA: Diagnosis not present

## 2017-07-07 DIAGNOSIS — Z5181 Encounter for therapeutic drug level monitoring: Secondary | ICD-10-CM | POA: Diagnosis not present

## 2017-07-07 DIAGNOSIS — R51 Headache: Secondary | ICD-10-CM | POA: Insufficient documentation

## 2017-07-07 DIAGNOSIS — E785 Hyperlipidemia, unspecified: Secondary | ICD-10-CM | POA: Insufficient documentation

## 2017-07-07 DIAGNOSIS — G4733 Obstructive sleep apnea (adult) (pediatric): Secondary | ICD-10-CM | POA: Diagnosis not present

## 2017-07-07 DIAGNOSIS — M5489 Other dorsalgia: Secondary | ICD-10-CM | POA: Diagnosis not present

## 2017-07-07 DIAGNOSIS — R000 Tachycardia, unspecified: Secondary | ICD-10-CM | POA: Diagnosis not present

## 2017-07-07 DIAGNOSIS — M1712 Unilateral primary osteoarthritis, left knee: Secondary | ICD-10-CM

## 2017-07-07 MED ORDER — MORPHINE SULFATE ER 30 MG PO TBCR: 30.0000 mg | EXTENDED_RELEASE_TABLET | Freq: Three times a day (TID) | ORAL | 0 refills | Status: DC

## 2017-07-07 NOTE — Progress Notes (Signed)
Subjective:    Patient ID: [name], female    DOB: 05-24-1950, 67 y.o.   MRN: [id number]  HPI: [name] is a 67 year old female who returns for follow up appointment for chronic pain and medication refill. She states her pain is located in her lower back radiating into his bilateral lower extremities and bilateral feet. She rates her pain 5. Her current exercise regime is walking .   [name] Morphine Equivalent is 99.00 MME.   Last UDS was Performed on 03/19/2017 it was consistent.    Pain Inventory Average Pain 7 Pain Right Now 5 My pain is sharp, burning, stabbing, tingling and aching  In the last 24 hours, has pain interfered with the following? General activity 6 Relation with others 5 Enjoyment of life 8 What TIME of day is your pain at its worst? night Sleep (in general) Poor  Pain is worse with: walking, bending, standing and some activites Pain improves with: heat/ice and medication Relief from Meds: 3  Mobility walk with assistance use a walker ability to climb steps?  no do you drive?  yes  Function I need assistance with the following:  dressing, bathing, household duties and shopping  Neuro/Psych bladder control problems bowel control problems weakness numbness tingling trouble walking dizziness depression anxiety  Prior Studies Any changes since last visit?  no  Physicians involved in your care Any changes since last visit?  no   Family History  Problem Relation Age of Onset  . Heart attack Mother   . Colon cancer Mother   . Heart attack Father   . Stroke Sister   . Hemophilia Child   . Cancer Unknown   . Coronary artery disease Unknown   . Cancer Brother   . Cancer Maternal Aunt   . Cancer Maternal Uncle   . Cancer Paternal Aunt   . Cancer Paternal Uncle   . Cancer Brother    Social History   Socioeconomic History  . Marital status: Legally Separated    Spouse name: Not on file  . Number of children: 4  .  Years of education: Not on file  . Highest education level: Not on file  Occupational History  . Occupation: Disabled  Social Needs  . Financial resource strain: Not on file  . Food insecurity:    Worry: Not on file    Inability: Not on file  . Transportation needs:    Medical: Not on file    Non-medical: Not on file  Tobacco Use  . Smoking status: Never Smoker  . Smokeless tobacco: Never Used  Substance and Sexual Activity  . Alcohol use: No    Alcohol/week: 0.0 oz  . Drug use: No  . Sexual activity: Not on file  Lifestyle  . Physical activity:    Days per week: Not on file    Minutes per session: Not on file  . Stress: Not on file  Relationships  . Social connections:    Talks on phone: Not on file    Gets together: Not on file    Attends religious service: Not on file    Active member of club or organization: Not on file    Attends meetings of clubs or organizations: Not on file    Relationship status: Not on file  Other Topics Concern  . Not on file  Social History Narrative   Patient does not get regular exercise   Denies caffeine use    Past  Surgical History:  Procedure Laterality Date  . ABDOMINAL HYSTERECTOMY    . Arm Surgery Bilateral    due to fall-pt broke both forearms  . BIOPSY N/A 07/01/2013   Procedure: BIOPSY;  Surgeon: Rogene Houston, MD;  Location: [facility];  Service: Endoscopy;  Laterality: N/A;  . CHOLECYSTECTOMY    . COLONOSCOPY  08/06/2011   Procedure: COLONOSCOPY;  Surgeon: Rogene Houston, MD;  [facility];  Service: Endoscopy;  Laterality: N/A;  215  . COLONOSCOPY WITH PROPOFOL N/A 12/31/2012   Procedure: COLONOSCOPY WITH PROPOFOL;  Surgeon: Rogene Houston, MD;  Location: [facility];  Service: Endoscopy;  Laterality: N/A;  in cecum at 0814, total withdrawal time 77min  . CORONARY ANGIOPLASTY WITH STENT PLACEMENT    . ESOPHAGEAL DILATION N/A 06/07/2015   Procedure: ESOPHAGEAL DILATION;  Surgeon: Rogene Houston, MD;  [facility];  Service: Endoscopy;  Laterality: N/A;  . ESOPHAGOGASTRODUODENOSCOPY N/A 06/07/2015   Procedure: ESOPHAGOGASTRODUODENOSCOPY (EGD);  Surgeon: Rogene Houston, MD;  [facility];  Service: Endoscopy;  Laterality: N/A;  2:45 - moved to 1:00 - Ann to notify pt  . ESOPHAGOGASTRODUODENOSCOPY (EGD) WITH PROPOFOL N/A 12/31/2012   Procedure: ESOPHAGOGASTRODUODENOSCOPY (EGD) WITH PROPOFOL;  Surgeon: Rogene Houston, MD;  Location: [facility];  Service: Endoscopy;  Laterality: N/A;  GE junction 37,  . ESOPHAGOGASTRODUODENOSCOPY (EGD) WITH PROPOFOL N/A 07/01/2013   Procedure: ESOPHAGOGASTRODUODENOSCOPY (EGD) WITH PROPOFOL;  Surgeon: Rogene Houston, MD;  Location: [facility];  Service: Endoscopy;  Laterality: N/A;  950  . MALONEY DILATION N/A 12/31/2012   Procedure: MALONEY DILATION;  Surgeon: Rogene Houston, MD;  Location: [facility];  Service: Endoscopy;  Laterality: N/A;  used a # 54,#56  . MALONEY DILATION N/A 07/01/2013   Procedure: Venia Minks DILATION;  Surgeon: Rogene Houston, MD;  Location: [facility];  Service: Endoscopy;  Laterality: N/A;  54/58; no heme present   Past Medical History:  Diagnosis Date  . Anxiety and depression   . Arthritis   . CAD (coronary artery disease)    Stent placement circumflex coronary 2007, catheterization 2008 patent stents. Normal LV function  . Chest pain   . CHF (congestive heart failure) ([place])   . COPD (chronic obstructive pulmonary disease) ([facility])    no home O2  . Depression   . Diabetes mellitus    Insulin dependent  . Diabetic polyneuropathy ([facility])     severe on multiple medications  . Dyslipidemia   . GERD (gastroesophageal reflux disease)   . Headache(784.0)   . 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 Hypertension   . MI (myocardial infarction) ([place])    2007  . Obstructive sleep apnea    CPAP, setting ?2  . Palpitations   . Tachycardia    Ht 5\' 2"  (1.575 m)   Wt 238 lb 9.6 oz (108.2 kg)   BMI 43.64 kg/m   Opioid Risk Score:   Fall Risk Score:   `1  Depression screen PHQ 2/9  Depression screen [facility] 2/9 06/12/2017 04/14/2017 02/17/2017 01/14/2017 12/24/2016 11/11/2016 10/28/2016  Decreased Interest 3 3 0 3 0 0 2  Down, Depressed, Hopeless 3 3 0 3 0 0 -  PHQ - 2 Score 6 6 0 6 0 0 2  Altered sleeping 3 - - - - - -  Tired, decreased energy 3 - - - - - -  Change in appetite 0 - - - - - -  Feeling bad or failure about yourself  3 - - - - - -  Trouble concentrating 3 - - - - - -  Moving slowly or fidgety/restless 1 - - - - - -  Suicidal thoughts 0 - - - - - -  PHQ-9 Score 19 - - - - - -  Difficult doing work/chores Very difficult - - - - - -  Some recent data might be hidden     Review of Systems  Constitutional: Negative.   HENT: Negative.   Eyes: Negative.   Respiratory: Positive for apnea and shortness of breath.   Cardiovascular: Negative.   Gastrointestinal: Positive for abdominal pain, constipation, diarrhea, nausea and vomiting.  Endocrine: Negative.   Genitourinary: Negative.   Musculoskeletal: Positive for arthralgias, back pain, gait problem and myalgias.  Skin: Negative.   Allergic/Immunologic: Negative.   Hematological: Negative.   Psychiatric/Behavioral: Negative.   All other systems reviewed and are negative.      Objective:   Physical Exam  Constitutional: She is oriented to person, place, and time. She appears well-developed and well-nourished.  HENT:  Head: Normocephalic and atraumatic.  Neck: Normal range of motion. Neck supple.  Cardiovascular: Normal rate and regular rhythm.  Pulmonary/Chest: Effort normal and breath sounds normal.  Musculoskeletal:  Normal Muscle Bulk and Muscle Testing Reveals: Upper Extremities: Full ROM and Muscle Strength 5/5 Lumbar Paraspinal Tenderness: L-4-L-5 Lower Extremities: Full ROM and Muscle Strength 5/5 Arises from Table slowly using walker for support Narrow Based Gait  Neurological: She is alert and oriented to person, place, and time.  Skin: Skin is warm and  dry.  Psychiatric: She has a normal mood and affect.  Nursing note and vitals reviewed.         Assessment & Plan:  1.Lumbar pain lumbar spondylosis: Encouraged to continue to increase Activity as tolerated. 07/07/2017 Continue current medication regimen. Refilled: MS Contin 30 MG one tablet every 8 hours #90. We will continue the opioid monitoring program, this consists of regular clinic visits, examinations, urine drug screen, pill counts as well as use of [place] Controlled Substance Reporting System. 2. Lumbar Radiculitis: Continue current medication regimen with Gabapentin. 07/07/2017 3. Bilateral Knee Pain/ Degenerative: Continue Voltaren Gel. Continue to Monitor. 07/07/2017. 4. Severe diabetic poly neuropathy: Continue current medication regimen with Gabapentin. 07/07/2017 5. Insomnia: ContinueTrazodone PsychiatryFollowing. 07/07/2017 6. Anxiety/ Depression: Continue Cymbalta and Hydroxyzine. Psychiatry Following. 07/07/2017 7. De Quervains Tenosynovitis: No Complaints Today. 07/07/2017 8. Bilateral Greater Trochanteric Bursitis: No complaints Today. Continue with Ice and Heat Therapy. Continue to Monitor. 07/07/2017.  20  minutes of face to face patient care time was spent during this visit. All questions were encouraged and answered.  F/U in 1 month

## 2017-07-13 ENCOUNTER — Other Ambulatory Visit: Payer: Self-pay | Admitting: Registered Nurse

## 2017-07-13 ENCOUNTER — Other Ambulatory Visit (INDEPENDENT_AMBULATORY_CARE_PROVIDER_SITE_OTHER): Payer: Self-pay | Admitting: Internal Medicine

## 2017-07-27 ENCOUNTER — Other Ambulatory Visit: Payer: Self-pay | Admitting: "Endocrinology

## 2017-08-05 ENCOUNTER — Encounter: Payer: Self-pay | Admitting: Registered Nurse

## 2017-08-05 ENCOUNTER — Encounter: Payer: Medicaid Other | Attending: Physical Medicine & Rehabilitation | Admitting: Registered Nurse

## 2017-08-05 VITALS — BP 135/83 | HR 78 | Ht 62.0 in | Wt 231.5 lb

## 2017-08-05 DIAGNOSIS — M1711 Unilateral primary osteoarthritis, right knee: Secondary | ICD-10-CM

## 2017-08-05 DIAGNOSIS — K219 Gastro-esophageal reflux disease without esophagitis: Secondary | ICD-10-CM | POA: Diagnosis not present

## 2017-08-05 DIAGNOSIS — M5489 Other dorsalgia: Secondary | ICD-10-CM | POA: Insufficient documentation

## 2017-08-05 DIAGNOSIS — G8929 Other chronic pain: Secondary | ICD-10-CM | POA: Insufficient documentation

## 2017-08-05 DIAGNOSIS — R000 Tachycardia, unspecified: Secondary | ICD-10-CM | POA: Diagnosis not present

## 2017-08-05 DIAGNOSIS — M79604 Pain in right leg: Secondary | ICD-10-CM | POA: Diagnosis not present

## 2017-08-05 DIAGNOSIS — M1712 Unilateral primary osteoarthritis, left knee: Secondary | ICD-10-CM | POA: Diagnosis not present

## 2017-08-05 DIAGNOSIS — F411 Generalized anxiety disorder: Secondary | ICD-10-CM

## 2017-08-05 DIAGNOSIS — G4700 Insomnia, unspecified: Secondary | ICD-10-CM | POA: Insufficient documentation

## 2017-08-05 DIAGNOSIS — Z794 Long term (current) use of insulin: Secondary | ICD-10-CM | POA: Insufficient documentation

## 2017-08-05 DIAGNOSIS — Z5181 Encounter for therapeutic drug level monitoring: Secondary | ICD-10-CM

## 2017-08-05 DIAGNOSIS — F329 Major depressive disorder, single episode, unspecified: Secondary | ICD-10-CM

## 2017-08-05 DIAGNOSIS — M48061 Spinal stenosis, lumbar region without neurogenic claudication: Secondary | ICD-10-CM

## 2017-08-05 DIAGNOSIS — G894 Chronic pain syndrome: Secondary | ICD-10-CM | POA: Diagnosis not present

## 2017-08-05 DIAGNOSIS — I509 Heart failure, unspecified: Secondary | ICD-10-CM | POA: Diagnosis not present

## 2017-08-05 DIAGNOSIS — R002 Palpitations: Secondary | ICD-10-CM | POA: Diagnosis not present

## 2017-08-05 DIAGNOSIS — I252 Old myocardial infarction: Secondary | ICD-10-CM | POA: Insufficient documentation

## 2017-08-05 DIAGNOSIS — M47896 Other spondylosis, lumbar region: Secondary | ICD-10-CM | POA: Insufficient documentation

## 2017-08-05 DIAGNOSIS — Z79899 Other long term (current) drug therapy: Secondary | ICD-10-CM

## 2017-08-05 DIAGNOSIS — M79605 Pain in left leg: Secondary | ICD-10-CM | POA: Diagnosis not present

## 2017-08-05 DIAGNOSIS — E785 Hyperlipidemia, unspecified: Secondary | ICD-10-CM | POA: Diagnosis not present

## 2017-08-05 DIAGNOSIS — F418 Other specified anxiety disorders: Secondary | ICD-10-CM | POA: Insufficient documentation

## 2017-08-05 DIAGNOSIS — R51 Headache: Secondary | ICD-10-CM | POA: Diagnosis not present

## 2017-08-05 DIAGNOSIS — E1142 Type 2 diabetes mellitus with diabetic polyneuropathy: Secondary | ICD-10-CM | POA: Diagnosis not present

## 2017-08-05 DIAGNOSIS — I2510 Atherosclerotic heart disease of native coronary artery without angina pectoris: Secondary | ICD-10-CM | POA: Insufficient documentation

## 2017-08-05 DIAGNOSIS — R079 Chest pain, unspecified: Secondary | ICD-10-CM | POA: Insufficient documentation

## 2017-08-05 DIAGNOSIS — Z760 Encounter for issue of repeat prescription: Secondary | ICD-10-CM | POA: Diagnosis not present

## 2017-08-05 DIAGNOSIS — Z1401 Asymptomatic hemophilia A carrier: Secondary | ICD-10-CM | POA: Diagnosis not present

## 2017-08-05 DIAGNOSIS — G4733 Obstructive sleep apnea (adult) (pediatric): Secondary | ICD-10-CM | POA: Insufficient documentation

## 2017-08-05 DIAGNOSIS — M5416 Radiculopathy, lumbar region: Secondary | ICD-10-CM

## 2017-08-05 DIAGNOSIS — J449 Chronic obstructive pulmonary disease, unspecified: Secondary | ICD-10-CM | POA: Diagnosis not present

## 2017-08-05 DIAGNOSIS — M1990 Unspecified osteoarthritis, unspecified site: Secondary | ICD-10-CM | POA: Diagnosis not present

## 2017-08-05 MED ORDER — MORPHINE SULFATE ER 30 MG PO TBCR: 30.0000 mg | EXTENDED_RELEASE_TABLET | Freq: Three times a day (TID) | ORAL | 0 refills | Status: DC

## 2017-08-05 NOTE — Progress Notes (Signed)
Subjective:    Patient ID: [name], female    DOB: Jan 02, 1951, 67 y.o.   MRN: [id number]  HPI: [name] is a 67 year old female who returns for follow up appointment for chronic pain and medication refill. She states her pain is located in her upper-lower back. She rates her pain 6. Her current exercise regime is walking.   [name] reports she's been experiencing bouts of nausea and vomiting with antibiotic medication regime. PCP following. Denies ED evlaution at this time, also has an appointment to follow up with her endocrinologist.   [name] Morphine Equivalent is 90.00 MME. Last UDS was Performed on 03/19/2017, it was consistent.    Pain Inventory Average Pain 7 Pain Right Now 6 My pain is sharp, burning, dull, stabbing, tingling and aching  In the last 24 hours, has pain interfered with the following? General activity 7 Relation with others 8 Enjoyment of life 6 What TIME of day is your pain at its worst? night Sleep (in general) Poor  Pain is worse with: walking, bending, sitting, inactivity, standing and some activites Pain improves with: rest and heat/ice Relief from Meds: 6  Mobility walk with assistance use a walker ability to climb steps?  no do you drive?  yes  Function disabled: date disabled . I need assistance with the following:  dressing, meal prep and household duties  Neuro/Psych weakness numbness tingling trouble walking spasms confusion depression anxiety  Prior Studies Any changes since last visit?  no  Physicians involved in your care Any changes since last visit?  no   Family History  Problem Relation Age of Onset  . Heart attack Mother   . Colon cancer Mother   . Heart attack Father   . Stroke Sister   . Hemophilia Child   . Cancer Unknown   . Coronary artery disease Unknown   . Cancer Brother   . Cancer Maternal Aunt   . Cancer Maternal Uncle   . Cancer Paternal Aunt   . Cancer Paternal Uncle   .  Cancer Brother    Social History   Socioeconomic History  . Marital status: Legally Separated    Spouse name: Not on file  . Number of children: 4  . Years of education: Not on file  . Highest education level: Not on file  Occupational History  . Occupation: Disabled  Social Needs  . Financial resource strain: Not on file  . Food insecurity:    Worry: Not on file    Inability: Not on file  . Transportation needs:    Medical: Not on file    Non-medical: Not on file  Tobacco Use  . Smoking status: Never Smoker  . Smokeless tobacco: Never Used  Substance and Sexual Activity  . Alcohol use: No    Alcohol/week: 0.0 oz  . Drug use: No  . Sexual activity: Not on file  Lifestyle  . Physical activity:    Days per week: Not on file    Minutes per session: Not on file  . Stress: Not on file  Relationships  . Social connections:    Talks on phone: Not on file    Gets together: Not on file    Attends religious service: Not on file    Active member of club or organization: Not on file    Attends meetings of clubs or organizations: Not on file    Relationship status: Not on file  Other Topics Concern  .  Not on file  Social History Narrative   Patient does not get regular exercise   Denies caffeine use    Past Surgical History:  Procedure Laterality Date  . ABDOMINAL HYSTERECTOMY    . Arm Surgery Bilateral    due to fall-pt broke both forearms  . BIOPSY N/A 07/01/2013   Procedure: BIOPSY;  Surgeon: Rogene Houston, MD;  Location: [facility];  Service: Endoscopy;  Laterality: N/A;  . CHOLECYSTECTOMY    . COLONOSCOPY  08/06/2011   Procedure: COLONOSCOPY;  Surgeon: Rogene Houston, MD;  [facility];  Service: Endoscopy;  Laterality: N/A;  215  . COLONOSCOPY WITH PROPOFOL N/A 12/31/2012   Procedure: COLONOSCOPY WITH PROPOFOL;  Surgeon: Rogene Houston, MD;  Location: [facility];  Service: Endoscopy;  Laterality: N/A;  in cecum at 0814, total withdrawal time 42min  . CORONARY  ANGIOPLASTY WITH STENT PLACEMENT    . ESOPHAGEAL DILATION N/A 06/07/2015   Procedure: ESOPHAGEAL DILATION;  Surgeon: Rogene Houston, MD;  [facility];  Service: Endoscopy;  Laterality: N/A;  . ESOPHAGOGASTRODUODENOSCOPY N/A 06/07/2015   Procedure: ESOPHAGOGASTRODUODENOSCOPY (EGD);  Surgeon: Rogene Houston, MD;  [facility];  Service: Endoscopy;  Laterality: N/A;  2:45 - moved to 1:00 - Ann to notify pt  . ESOPHAGOGASTRODUODENOSCOPY (EGD) WITH PROPOFOL N/A 12/31/2012   Procedure: ESOPHAGOGASTRODUODENOSCOPY (EGD) WITH PROPOFOL;  Surgeon: Rogene Houston, MD;  [facility];  Service: Endoscopy;  Laterality: N/A;  GE junction 37,  . ESOPHAGOGASTRODUODENOSCOPY (EGD) WITH PROPOFOL N/A 07/01/2013   Procedure: ESOPHAGOGASTRODUODENOSCOPY (EGD) WITH PROPOFOL;  Surgeon: Rogene Houston, MD;  [facility];  Service: Endoscopy;  Laterality: N/A;  950  . MALONEY DILATION N/A 12/31/2012   Procedure: MALONEY DILATION;  Surgeon: Rogene Houston, MD;  Location: [facility];  Service: Endoscopy;  Laterality: N/A;  used a # 54,#56  . MALONEY DILATION N/A 07/01/2013   Procedure: Venia Minks DILATION;  Surgeon: Rogene Houston, MD;  Location: [facility];  Service: Endoscopy;  Laterality: N/A;  54/58; no heme present   Past Medical History:  Diagnosis Date  . Anxiety and depression   . Arthritis   . CAD (coronary artery disease)    Stent placement circumflex coronary 2007, catheterization 2008 patent stents. Normal LV function  . Chest pain   . CHF (congestive heart failure) ([place])   . COPD (chronic obstructive pulmonary disease) (H[facility])    no home O2  . Depression   . Diabetes mellitus    Insulin dependent  . Diabetic polyneuropathy (HCC)     severe on multiple medications  . Dyslipidemia   . GERD (gastroesophageal reflux disease)   . Headache(784.0)   . 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 Hypertension   . MI (myocardial infarction) ([place])    2007  . Obstructive sleep apnea    CPAP, setting ?2  .  Palpitations   . Tachycardia    Ht 5\' 2"  (1.575 m)   Wt 231 lb 8 oz (105 kg)   BMI 42.34 kg/m   Opioid Risk Score:   Fall Risk Score:  `1  Depression screen PHQ 2/9  Depression screen [facility] 2/9 06/12/2017 04/14/2017 02/17/2017 01/14/2017 12/24/2016 11/11/2016 10/28/2016  Decreased Interest 3 3 0 3 0 0 2  Down, Depressed, Hopeless 3 3 0 3 0 0 -  PHQ - 2 Score 6 6 0 6 0 0 2  Altered sleeping 3 - - - - - -  Tired, decreased energy 3 - - - - - -  Change in appetite 0 - - - - - -  Feeling bad or failure about yourself  3 - - - - - -  Trouble concentrating 3 - - - - - -  Moving slowly or fidgety/restless 1 - - - - - -  Suicidal thoughts 0 - - - - - -  PHQ-9 Score 19 - - - - - -  Difficult doing work/chores Very difficult - - - - - -  Some recent data might be hidden     Review of Systems  Constitutional: Positive for appetite change, chills, diaphoresis, fever and unexpected weight change.  HENT: Negative.   Eyes: Negative.   Respiratory: Positive for apnea, shortness of breath and wheezing.   Cardiovascular: Negative.   Gastrointestinal: Positive for abdominal pain, constipation, diarrhea, nausea and vomiting.  Endocrine: Negative.   Genitourinary: Positive for difficulty urinating and dysuria.  Musculoskeletal: Positive for arthralgias, back pain, gait problem, joint swelling and myalgias.  Skin: Positive for rash.  Allergic/Immunologic: Negative.   Neurological: Positive for weakness and numbness.  Hematological: Negative.   Psychiatric/Behavioral: Negative.   All other systems reviewed and are negative.      Objective:   Physical Exam  Constitutional: She is oriented to person, place, and time. She appears well-developed and well-nourished.  HENT:  Head: Normocephalic and atraumatic.  Neck: Normal range of motion. Neck supple.  Cardiovascular: Normal rate and regular rhythm.  Pulmonary/Chest: Effort normal and breath sounds normal.  Musculoskeletal:  Normal Muscle  Bulk and Muscle Testing Reveals: Upper Extremities: Full ROM and Muscle Strength 5/5 Thoracic Paraspinal Tenderness: T-1-T-3 Lower Extremities: Full ROM and Muscle Strength 5/5 Arises from Table with Ease Narrow Based Gait  Neurological: She is alert and oriented to person, place, and time.  Skin: Skin is warm and dry.  Psychiatric: She has a normal mood and affect.  Nursing note and vitals reviewed.         Assessment & Plan:  1.Lumbar pain lumbar spondylosis: Encouraged to continue to increase Activity as tolerated. 08/05/2017 Continue current medication regimen. Refilled: MS Contin 30 MG one tablet every 8 hours #90. We will continue the opioid monitoring program, this consists of regular clinic visits, examinations, urine drug screen, pill counts as well as use of [place] Controlled Substance Reporting System. 2. Lumbar Radiculitis: Continue current medication regimen with Gabapentin.08/05/2017 3. Bilateral Knee Pain/ Degenerative: Continue Voltaren Gel. Continue to Monitor. 08/05/2017. 4. Severe diabetic poly neuropathy: Continue current medication regimen with Gabapentin. 08/05/2017 5. Insomnia: ContinueTrazodone PsychiatryFollowing. 08/05/2017 6. Anxiety/ Depression: Continue Cymbalta andHydroxyzine. Psychiatry Following. 08/05/2017 7. De Quervains Tenosynovitis: No Complaints Today. 08/05/2017 8. Bilateral Greater Trochanteric Bursitis: No complaints Today. Continue with Ice and Heat Therapy. Continue to Monitor. 08/05/2017.  20 minutes of face to face patient care time was spent during this visit. All questions were encouraged and answered.  F/U in 1 month

## 2017-08-12 ENCOUNTER — Other Ambulatory Visit: Payer: Self-pay | Admitting: "Endocrinology

## 2017-09-02 ENCOUNTER — Encounter: Payer: Medicaid Other | Attending: Physical Medicine & Rehabilitation | Admitting: Registered Nurse

## 2017-09-02 ENCOUNTER — Encounter: Payer: Self-pay | Admitting: Registered Nurse

## 2017-09-02 VITALS — BP 167/87 | HR 97 | Ht 62.0 in | Wt 227.2 lb

## 2017-09-02 DIAGNOSIS — M5416 Radiculopathy, lumbar region: Secondary | ICD-10-CM

## 2017-09-02 DIAGNOSIS — R000 Tachycardia, unspecified: Secondary | ICD-10-CM | POA: Insufficient documentation

## 2017-09-02 DIAGNOSIS — Z1401 Asymptomatic hemophilia A carrier: Secondary | ICD-10-CM | POA: Insufficient documentation

## 2017-09-02 DIAGNOSIS — R079 Chest pain, unspecified: Secondary | ICD-10-CM | POA: Insufficient documentation

## 2017-09-02 DIAGNOSIS — I2510 Atherosclerotic heart disease of native coronary artery without angina pectoris: Secondary | ICD-10-CM | POA: Insufficient documentation

## 2017-09-02 DIAGNOSIS — K219 Gastro-esophageal reflux disease without esophagitis: Secondary | ICD-10-CM | POA: Insufficient documentation

## 2017-09-02 DIAGNOSIS — R002 Palpitations: Secondary | ICD-10-CM | POA: Diagnosis not present

## 2017-09-02 DIAGNOSIS — F418 Other specified anxiety disorders: Secondary | ICD-10-CM | POA: Diagnosis not present

## 2017-09-02 DIAGNOSIS — M7061 Trochanteric bursitis, right hip: Secondary | ICD-10-CM | POA: Diagnosis not present

## 2017-09-02 DIAGNOSIS — R51 Headache: Secondary | ICD-10-CM | POA: Insufficient documentation

## 2017-09-02 DIAGNOSIS — Z760 Encounter for issue of repeat prescription: Secondary | ICD-10-CM | POA: Diagnosis present

## 2017-09-02 DIAGNOSIS — M1711 Unilateral primary osteoarthritis, right knee: Secondary | ICD-10-CM

## 2017-09-02 DIAGNOSIS — M1990 Unspecified osteoarthritis, unspecified site: Secondary | ICD-10-CM | POA: Diagnosis not present

## 2017-09-02 DIAGNOSIS — J449 Chronic obstructive pulmonary disease, unspecified: Secondary | ICD-10-CM | POA: Insufficient documentation

## 2017-09-02 DIAGNOSIS — F329 Major depressive disorder, single episode, unspecified: Secondary | ICD-10-CM | POA: Diagnosis not present

## 2017-09-02 DIAGNOSIS — M7062 Trochanteric bursitis, left hip: Secondary | ICD-10-CM

## 2017-09-02 DIAGNOSIS — M1712 Unilateral primary osteoarthritis, left knee: Secondary | ICD-10-CM

## 2017-09-02 DIAGNOSIS — F411 Generalized anxiety disorder: Secondary | ICD-10-CM | POA: Diagnosis not present

## 2017-09-02 DIAGNOSIS — M79605 Pain in left leg: Secondary | ICD-10-CM | POA: Diagnosis not present

## 2017-09-02 DIAGNOSIS — E785 Hyperlipidemia, unspecified: Secondary | ICD-10-CM | POA: Insufficient documentation

## 2017-09-02 DIAGNOSIS — Z794 Long term (current) use of insulin: Secondary | ICD-10-CM | POA: Insufficient documentation

## 2017-09-02 DIAGNOSIS — E1142 Type 2 diabetes mellitus with diabetic polyneuropathy: Secondary | ICD-10-CM | POA: Insufficient documentation

## 2017-09-02 DIAGNOSIS — Z79899 Other long term (current) drug therapy: Secondary | ICD-10-CM | POA: Diagnosis not present

## 2017-09-02 DIAGNOSIS — I509 Heart failure, unspecified: Secondary | ICD-10-CM | POA: Diagnosis not present

## 2017-09-02 DIAGNOSIS — G894 Chronic pain syndrome: Secondary | ICD-10-CM | POA: Diagnosis not present

## 2017-09-02 DIAGNOSIS — G8929 Other chronic pain: Secondary | ICD-10-CM | POA: Diagnosis not present

## 2017-09-02 DIAGNOSIS — M48061 Spinal stenosis, lumbar region without neurogenic claudication: Secondary | ICD-10-CM

## 2017-09-02 DIAGNOSIS — M79604 Pain in right leg: Secondary | ICD-10-CM | POA: Diagnosis not present

## 2017-09-02 DIAGNOSIS — I252 Old myocardial infarction: Secondary | ICD-10-CM | POA: Diagnosis not present

## 2017-09-02 DIAGNOSIS — M5489 Other dorsalgia: Secondary | ICD-10-CM | POA: Insufficient documentation

## 2017-09-02 DIAGNOSIS — G4733 Obstructive sleep apnea (adult) (pediatric): Secondary | ICD-10-CM | POA: Insufficient documentation

## 2017-09-02 DIAGNOSIS — Z5181 Encounter for therapeutic drug level monitoring: Secondary | ICD-10-CM | POA: Diagnosis not present

## 2017-09-02 DIAGNOSIS — M47896 Other spondylosis, lumbar region: Secondary | ICD-10-CM | POA: Diagnosis not present

## 2017-09-02 DIAGNOSIS — G4700 Insomnia, unspecified: Secondary | ICD-10-CM | POA: Insufficient documentation

## 2017-09-02 MED ORDER — MORPHINE SULFATE ER 30 MG PO TBCR: 30.0000 mg | EXTENDED_RELEASE_TABLET | Freq: Three times a day (TID) | ORAL | 0 refills | Status: DC

## 2017-09-02 NOTE — Progress Notes (Signed)
Subjective:    Patient ID: [name], female    DOB: October 28, 1950, 67 y.o.   MRN: [id number]  HPI: [name] is a 67 year old female who returns for follow up appointment for chronic pain nand medication refill. She states her pain is located in her lower back radiating into her bilateral hips and bilateral lower extremities. She rates her pain 6. Her current exercise regime is walking with her walker.   [name] arrived to our office jittery( very anxious), she reports her psychiatric office gave her a weaning schedule for Cymbalta, she admits she had stopped taking the Cymbalta abruptly, she's not sure why she did not follow the instructions. [name] Cymbalta was initiated in 2018. [name] called her psychiatric office on 09/02/2017, they were suppose to call our office, no call was received. This provider placed a call to [place] on 09/03/17, no return call at this time.  [name] went to [facility] on 09/01/2017, they were called and medical release form  was signed. Notes were sent and reviewed. [name] was diagnosed with Drug withdrawal, she received Lorazepam 1 mg IV, she was given a prescription for Klonopin, she was unable to fill prescription, due to [facility] Block. This provider called the Psychiatric Office to see if they agreed with the prescription, no call back. [name] was instructed to call office, she verbalizes understanding.   [name] Morphine Equivalent is 90.00 MME. She  is also prescribed Clonazepam by Noemi Chapel NP .We have discussed the black box warning of using opioids and benzodiazepines. I highlighted the dangers of using these drugs together and discussed the adverse events including respiratory suppression, overdose, cognitive impairment and importance of compliance with current regimen. We will continue to monitor and adjust as indicated.  She is being closely monitored and under the  care of her psychiatrist [place].   Also noted on the PMP Aware Web-site [name] Clonazepam being filled under this provider name, pharmacist will remove any recurrent refills under my name. Her Clonazepam being prescribed by Noemi Chapel, he verbalized understanding.  This Provider placed another call to [place]  On 09/03/2017 at 16:20 pm,I spoke with the pharmacist from [facility], regarding the above she will send [name] a message she stated. Placed a call to [name], no answer, left message to return the call.   Pain Inventory Average Pain 8 Pain Right Now 6 My pain is sharp, burning, dull, stabbing, tingling and aching  In the last 24 hours, has pain interfered with the following? General activity 8 Relation with others 8 Enjoyment of life 8 What TIME of day is your pain at its worst? night Sleep (in general) Poor  Pain is worse with: walking, bending, inactivity, standing and some activites Pain improves with: rest, heat/ice and medication Relief from Meds: 6  Mobility walk with assistance use a cane use a walker ability to climb steps?  no do you drive?  yes needs help with transfers  Function disabled: date disabled . I need assistance with the following:  dressing, bathing, meal prep and shopping  Neuro/Psych weakness numbness tingling trouble walking spasms dizziness depression anxiety  Prior Studies Any changes since last visit?  no  Physicians involved in your care Any changes since last visit?  no   Family History  Problem Relation Age of Onset  . Heart attack Mother   . Colon cancer Mother   .  Heart attack Father   . Stroke Sister   . Hemophilia Child   . Cancer Unknown   . Coronary artery disease Unknown   . Cancer Brother   . Cancer Maternal Aunt   . Cancer Maternal Uncle   . Cancer Paternal Aunt   . Cancer Paternal Uncle   . Cancer Brother    Social History   Socioeconomic History  . Marital  status: Legally Separated    Spouse name: Not on file  . Number of children: 4  . Years of education: Not on file  . Highest education level: Not on file  Occupational History  . Occupation: Disabled  Social Needs  . Financial resource strain: Not on file  . Food insecurity:    Worry: Not on file    Inability: Not on file  . Transportation needs:    Medical: Not on file    Non-medical: Not on file  Tobacco Use  . Smoking status: Never Smoker  . Smokeless tobacco: Never Used  Substance and Sexual Activity  . Alcohol use: No    Alcohol/week: 0.0 oz  . Drug use: No  . Sexual activity: Not on file  Lifestyle  . Physical activity:    Days per week: Not on file    Minutes per session: Not on file  . Stress: Not on file  Relationships  . Social connections:    Talks on phone: Not on file    Gets together: Not on file    Attends religious service: Not on file    Active member of club or organization: Not on file    Attends meetings of clubs or organizations: Not on file    Relationship status: Not on file  Other Topics Concern  . Not on file  Social History Narrative   Patient does not get regular exercise   Denies caffeine use    Past Surgical History:  Procedure Laterality Date  . ABDOMINAL HYSTERECTOMY    . Arm Surgery Bilateral    due to fall-pt broke both forearms  . BIOPSY N/A 07/01/2013   Procedure: BIOPSY;  Surgeon: Rogene Houston, MD;  [facility];  Service: Endoscopy;  Laterality: N/A;  . CHOLECYSTECTOMY    . COLONOSCOPY  08/06/2011   Procedure: COLONOSCOPY;  Surgeon: Rogene Houston, MD;  [facility];  Service: Endoscopy;  Laterality: N/A;  215  . COLONOSCOPY WITH PROPOFOL N/A 12/31/2012   Procedure: COLONOSCOPY WITH PROPOFOL;  Surgeon: Rogene Houston, MD;  Location: [facility];  Service: Endoscopy;  Laterality: N/A;  in cecum at 0814, total withdrawal time 35min  . CORONARY ANGIOPLASTY WITH STENT PLACEMENT    . ESOPHAGEAL DILATION N/A 06/07/2015     Procedure: ESOPHAGEAL DILATION;  Surgeon: Rogene Houston, MD;  [facility];  Service: Endoscopy;  Laterality: N/A;  . ESOPHAGOGASTRODUODENOSCOPY N/A 06/07/2015   Procedure: ESOPHAGOGASTRODUODENOSCOPY (EGD);  Surgeon: Rogene Houston, MD;  [facility];  Service: Endoscopy;  Laterality: N/A;  2:45 - moved to 1:00 - Ann to notify pt  . ESOPHAGOGASTRODUODENOSCOPY (EGD) WITH PROPOFOL N/A 12/31/2012   Procedure: ESOPHAGOGASTRODUODENOSCOPY (EGD) WITH PROPOFOL;  Surgeon: Rogene Houston, MD;  [facility];  Service: Endoscopy;  Laterality: N/A;  GE junction 37,  . ESOPHAGOGASTRODUODENOSCOPY (EGD) WITH PROPOFOL N/A 07/01/2013   Procedure: ESOPHAGOGASTRODUODENOSCOPY (EGD) WITH PROPOFOL;  Surgeon: Rogene Houston, MD;  [facility];  Service: Endoscopy;  Laterality: N/A;  950  . MALONEY DILATION N/A 12/31/2012  Procedure: MALONEY DILATION;  Surgeon: Rogene Houston, MD;  Location: [facility];  Service: Endoscopy;  Laterality: N/A;  used a # 54,#56  . MALONEY DILATION N/A 07/01/2013   Procedure: Venia Minks DILATION;  Surgeon: Rogene Houston, MD;  Location: [facility];  Service: Endoscopy;  Laterality: N/A;  54/58; no heme present   Past Medical History:  Diagnosis Date  . Anxiety and depression   . Arthritis   . CAD (coronary artery disease)    Stent placement circumflex coronary 2007, catheterization 2008 patent stents. Normal LV function  . Chest pain   . CHF (congestive heart failure) ([place])   . COPD (chronic obstructive pulmonary disease) (HCC)    no home O2  . Depression   . Diabetes mellitus    Insulin dependent  . Diabetic polyneuropathy (HCC)     severe on multiple medications  . Dyslipidemia   . GERD (gastroesophageal reflux disease)   . Headache(784.0)   . 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 Hypertension   . MI (myocardial infarction) ([place])    2007  . Obstructive sleep apnea    CPAP, setting ?2  . Palpitations   . Tachycardia    There were no vitals taken for this  visit.  Opioid Risk Score:   Fall Risk Score:  `1  Depression screen PHQ 2/9  Depression screen [facility] 2/9 06/12/2017 04/14/2017 02/17/2017 01/14/2017 12/24/2016 11/11/2016 10/28/2016  Decreased Interest 3 3 0 3 0 0 2  Down, Depressed, Hopeless 3 3 0 3 0 0 -  PHQ - 2 Score 6 6 0 6 0 0 2  Altered sleeping 3 - - - - - -  Tired, decreased energy 3 - - - - - -  Change in appetite 0 - - - - - -  Feeling bad or failure about yourself  3 - - - - - -  Trouble concentrating 3 - - - - - -  Moving slowly or fidgety/restless 1 - - - - - -  Suicidal thoughts 0 - - - - - -  PHQ-9 Score 19 - - - - - -  Difficult doing work/chores Very difficult - - - - - -  Some recent data might be hidden     Review of Systems  Constitutional: Positive for chills, diaphoresis and fever.  HENT: Negative.   Eyes: Negative.   Respiratory: Positive for cough and shortness of breath.        URIs   Cardiovascular: Negative.   Gastrointestinal: Positive for abdominal pain, constipation, diarrhea, nausea and vomiting.  Endocrine: Negative.        High sugar  Genitourinary: Positive for dysuria.  Musculoskeletal: Positive for arthralgias, back pain, gait problem, joint swelling and myalgias.  Skin: Negative.   Allergic/Immunologic: Negative.   Neurological: Positive for dizziness, weakness and numbness.  Hematological: Negative.   Psychiatric/Behavioral: Negative.        Objective:   Physical Exam  Constitutional: She is oriented to person, place, and time. She appears well-developed and well-nourished.  HENT:  Head: Normocephalic and atraumatic.  Neck: Normal range of motion. Neck supple.  Cardiovascular: Normal rate and regular rhythm.  Pulmonary/Chest: Effort normal and breath sounds normal.  Musculoskeletal:  Normal Muscle Bulk and Muscle Testing Reveals: Upper Extremities: Full ROM and Muscle Strength 4/5 Right AC Joint Tenderness Thoracic and Lumbar Hypersensitivity Bilateral Greater Trochanter  Tenderness Lower Extremities: Full ROM and Muscle Strength 4/5 Arises from Table Slowly using walker for support Transfer to wheelchair  Neurological: She is alert and oriented to person, place, and time.  Skin: Skin is warm and dry.  Psychiatric: She has a normal mood and affect.  Nursing note and vitals reviewed.         Assessment & Plan:  1.Lumbar pain lumbar spondylosis: Encouraged to continue toincrease Activity as tolerated. 06/219/2019 Continue current medication regimen.Refilled: MS Contin 30 MG one tablet every 8 hours #90. We will continue the opioid monitoring program, this consists of regular clinic visits, examinations, urine drug screen, pill counts as well as use of [place] Controlled Substance Reporting System. 2. Lumbar Radiculitis: Continuecurrent medication regimen withGabapentin.09/02/2017 3. Bilateral Knee Pain/ Degenerative: Continue Voltaren Gel. Continue to Monitor. 09/02/2017. 4. Severe diabetic poly neuropathy: Continuecurrent medication regimen withGabapentin. 09/02/2017 5. Insomnia: ContinueTrazodone PsychiatryFollowing. 09/02/2017 6. Anxiety/ Depression: Continue Cymbalta andHydroxyzine. Psychiatry Following.09/02/2017 7. De Quervains Tenosynovitis: No Complaints Today. 09/02/2017 8. Bilateral Greater Trochanteric Bursitis:Continue with Ice and Heat Therapy. Continue to Monitor. 09/02/2017. 9. Medication Withdrawal: [name] abruptly stopped her Cymbalta: Her Psychiatrist office notified. She was seen at [facility] Emergency department on 09/01/2017.   60 minutes of face to face patient care time was spent during this visit. All questions were encouraged and answered.  F/U in 1 month

## 2017-09-04 ENCOUNTER — Telehealth: Payer: Self-pay | Admitting: *Deleted

## 2017-09-04 NOTE — Telephone Encounter (Signed)
Return Ms. Delatte call, she was instructed to call her Psychiatrist regarding the events. This provider had placed two call with no return call. She verbalizes understanding.

## 2017-09-04 NOTE — Telephone Encounter (Signed)
[name].  No message about what this is about.

## 2017-09-09 ENCOUNTER — Other Ambulatory Visit: Payer: Self-pay | Admitting: Cardiovascular Disease

## 2017-09-09 ENCOUNTER — Telehealth: Payer: Self-pay

## 2017-09-09 LAB — TOXASSURE SELECT,+ANTIDEPR,UR

## 2017-09-09 LAB — 6-ACETYLMORPHINE,TOXASSURE ADD
6-ACETYLMORPHINE: NEGATIVE
6-acetylmorphine: NOT DETECTED ng/mg creat

## 2017-09-09 NOTE — Telephone Encounter (Signed)
Rubye Beach, NP office called returning call to Danella Sensing, NP

## 2017-09-09 NOTE — Telephone Encounter (Signed)
Placed a call to [facility] [phone], office was closed. Will call in the morning.

## 2017-09-10 ENCOUNTER — Other Ambulatory Visit (HOSPITAL_COMMUNITY): Payer: Self-pay | Admitting: Psychiatry

## 2017-09-10 NOTE — Telephone Encounter (Signed)
Spoke with Ms. Noemi Chapel this morning, she stated she will speak with Ms. Gathers.

## 2017-09-11 ENCOUNTER — Telehealth: Payer: Self-pay | Admitting: *Deleted

## 2017-09-11 NOTE — Telephone Encounter (Signed)
Urine drug screen for this encounter is consistent for prescribed medication. She has fluoxetine (Prozac) present which is not on her medication list.

## 2017-09-21 ENCOUNTER — Other Ambulatory Visit (HOSPITAL_COMMUNITY): Payer: Self-pay | Admitting: Psychiatry

## 2017-09-22 ENCOUNTER — Other Ambulatory Visit (HOSPITAL_COMMUNITY): Payer: Self-pay | Admitting: Psychiatry

## 2017-09-29 ENCOUNTER — Ambulatory Visit: Payer: Medicaid Other | Admitting: Registered Nurse

## 2017-09-30 ENCOUNTER — Encounter: Payer: Medicaid Other | Attending: Physical Medicine & Rehabilitation | Admitting: Registered Nurse

## 2017-09-30 ENCOUNTER — Encounter: Payer: Self-pay | Admitting: Registered Nurse

## 2017-09-30 ENCOUNTER — Other Ambulatory Visit: Payer: Self-pay | Admitting: "Endocrinology

## 2017-09-30 VITALS — BP 132/80 | HR 79 | Resp 14 | Ht 62.0 in | Wt 232.0 lb

## 2017-09-30 DIAGNOSIS — M1990 Unspecified osteoarthritis, unspecified site: Secondary | ICD-10-CM | POA: Diagnosis not present

## 2017-09-30 DIAGNOSIS — Z5181 Encounter for therapeutic drug level monitoring: Secondary | ICD-10-CM | POA: Diagnosis not present

## 2017-09-30 DIAGNOSIS — R51 Headache: Secondary | ICD-10-CM | POA: Diagnosis not present

## 2017-09-30 DIAGNOSIS — Z760 Encounter for issue of repeat prescription: Secondary | ICD-10-CM | POA: Insufficient documentation

## 2017-09-30 DIAGNOSIS — E1142 Type 2 diabetes mellitus with diabetic polyneuropathy: Secondary | ICD-10-CM | POA: Insufficient documentation

## 2017-09-30 DIAGNOSIS — M1711 Unilateral primary osteoarthritis, right knee: Secondary | ICD-10-CM

## 2017-09-30 DIAGNOSIS — Z1401 Asymptomatic hemophilia A carrier: Secondary | ICD-10-CM | POA: Insufficient documentation

## 2017-09-30 DIAGNOSIS — M5489 Other dorsalgia: Secondary | ICD-10-CM | POA: Insufficient documentation

## 2017-09-30 DIAGNOSIS — F329 Major depressive disorder, single episode, unspecified: Secondary | ICD-10-CM | POA: Diagnosis not present

## 2017-09-30 DIAGNOSIS — J449 Chronic obstructive pulmonary disease, unspecified: Secondary | ICD-10-CM | POA: Diagnosis not present

## 2017-09-30 DIAGNOSIS — G8929 Other chronic pain: Secondary | ICD-10-CM | POA: Insufficient documentation

## 2017-09-30 DIAGNOSIS — M5416 Radiculopathy, lumbar region: Secondary | ICD-10-CM | POA: Diagnosis not present

## 2017-09-30 DIAGNOSIS — K219 Gastro-esophageal reflux disease without esophagitis: Secondary | ICD-10-CM | POA: Diagnosis not present

## 2017-09-30 DIAGNOSIS — R079 Chest pain, unspecified: Secondary | ICD-10-CM | POA: Insufficient documentation

## 2017-09-30 DIAGNOSIS — I509 Heart failure, unspecified: Secondary | ICD-10-CM | POA: Insufficient documentation

## 2017-09-30 DIAGNOSIS — M546 Pain in thoracic spine: Secondary | ICD-10-CM | POA: Diagnosis not present

## 2017-09-30 DIAGNOSIS — R002 Palpitations: Secondary | ICD-10-CM | POA: Insufficient documentation

## 2017-09-30 DIAGNOSIS — E785 Hyperlipidemia, unspecified: Secondary | ICD-10-CM | POA: Diagnosis not present

## 2017-09-30 DIAGNOSIS — G4700 Insomnia, unspecified: Secondary | ICD-10-CM | POA: Diagnosis not present

## 2017-09-30 DIAGNOSIS — M1712 Unilateral primary osteoarthritis, left knee: Secondary | ICD-10-CM

## 2017-09-30 DIAGNOSIS — E1165 Type 2 diabetes mellitus with hyperglycemia: Secondary | ICD-10-CM

## 2017-09-30 DIAGNOSIS — M7061 Trochanteric bursitis, right hip: Secondary | ICD-10-CM

## 2017-09-30 DIAGNOSIS — I252 Old myocardial infarction: Secondary | ICD-10-CM | POA: Diagnosis not present

## 2017-09-30 DIAGNOSIS — Z794 Long term (current) use of insulin: Secondary | ICD-10-CM | POA: Insufficient documentation

## 2017-09-30 DIAGNOSIS — F418 Other specified anxiety disorders: Secondary | ICD-10-CM | POA: Insufficient documentation

## 2017-09-30 DIAGNOSIS — I2510 Atherosclerotic heart disease of native coronary artery without angina pectoris: Secondary | ICD-10-CM | POA: Insufficient documentation

## 2017-09-30 DIAGNOSIS — M79605 Pain in left leg: Secondary | ICD-10-CM | POA: Diagnosis not present

## 2017-09-30 DIAGNOSIS — F411 Generalized anxiety disorder: Secondary | ICD-10-CM

## 2017-09-30 DIAGNOSIS — G4733 Obstructive sleep apnea (adult) (pediatric): Secondary | ICD-10-CM | POA: Insufficient documentation

## 2017-09-30 DIAGNOSIS — G894 Chronic pain syndrome: Secondary | ICD-10-CM

## 2017-09-30 DIAGNOSIS — M47896 Other spondylosis, lumbar region: Secondary | ICD-10-CM | POA: Insufficient documentation

## 2017-09-30 DIAGNOSIS — R000 Tachycardia, unspecified: Secondary | ICD-10-CM | POA: Insufficient documentation

## 2017-09-30 DIAGNOSIS — M7062 Trochanteric bursitis, left hip: Secondary | ICD-10-CM

## 2017-09-30 DIAGNOSIS — Z79899 Other long term (current) drug therapy: Secondary | ICD-10-CM

## 2017-09-30 DIAGNOSIS — M7541 Impingement syndrome of right shoulder: Secondary | ICD-10-CM | POA: Diagnosis not present

## 2017-09-30 DIAGNOSIS — M79604 Pain in right leg: Secondary | ICD-10-CM | POA: Insufficient documentation

## 2017-09-30 MED ORDER — MORPHINE SULFATE ER 30 MG PO TBCR: 30.0000 mg | EXTENDED_RELEASE_TABLET | Freq: Three times a day (TID) | ORAL | 0 refills | Status: DC

## 2017-09-30 NOTE — Progress Notes (Signed)
Subjective:    Patient ID: [name], female    DOB: October 14, 1950, 67 y.o.   MRN: [id number]  HPI: [name] is a 67 year old female who returns for follow up appointment for chronic pain and medication refill. She states her pain is located in her right shoulder, mid-lower back radiating into her bilateral lower extremities, bilateral hips and bilateral knees. She rates her pain 8. Her current exercise regime is walking.   [name] is 90.00 MME. She is also prescribed Clonazepam and will be changed to Ativan by Noemi Chapel.We have discussed the black box warning of using opioids and benzodiazepines. I highlighted the dangers of using these drugs together and discussed the adverse events including respiratory suppression, overdose, cognitive impairment and importance of compliance with current regimen. We will continue to monitor and adjust as indicated. She is being closely monitored and under the care of her psychiatrist Dr. Casimiro Needle. Last UDS was Performed on 09/02/2017.  Marland Kitchen Pain Inventory Average Pain 8 Pain Right Now 8 My pain is sharp, burning, tingling and aching  In the last 24 hours, has pain interfered with the following? General activity 1 Relation with others 7 Enjoyment of life 5 What TIME of day is your pain at its worst? morning, night Sleep (in general) Poor  Pain is worse with: standing and some activites Pain improves with: rest, heat/ice and medication Relief from Meds: 0  Mobility walk without assistance walk with assistance use a cane use a walker how many minutes can you walk? 10 ability to climb steps?  no do you drive?  yes Do you have any goals in this area?  no  Function not employed: date last employed . I need assistance with the following:  meal prep, household duties and shopping  Neuro/Psych bladder control problems bowel control problems weakness numbness tremor tingling trouble  walking spasms dizziness confusion depression anxiety  Prior Studies Any changes since last visit?  no  Physicians involved in your care Any changes since last visit?  no   Family History  Problem Relation Age of Onset  . Heart attack Mother   . Colon cancer Mother   . Heart attack Father   . Stroke Sister   . Hemophilia Child   . Cancer Unknown   . Coronary artery disease Unknown   . Cancer Brother   . Cancer Maternal Aunt   . Cancer Maternal Uncle   . Cancer Paternal Aunt   . Cancer Paternal Uncle   . Cancer Brother    Social History   Socioeconomic History  . Marital status: Legally Separated    Spouse name: Not on file  . Number of children: 4  . Years of education: Not on file  . Highest education level: Not on file  Occupational History  . Occupation: Disabled  Social Needs  . Financial resource strain: Not on file  . Food insecurity:    Worry: Not on file    Inability: Not on file  . Transportation needs:    Medical: Not on file    Non-medical: Not on file  Tobacco Use  . Smoking status: Never Smoker  . Smokeless tobacco: Never Used  Substance and Sexual Activity  . Alcohol use: No    Alcohol/week: 0.0 oz  . Drug use: No  . Sexual activity: Not on file  Lifestyle  . Physical activity:    Days per week: Not on file    Minutes per session:  Not on file  . Stress: Not on file  Relationships  . Social connections:    Talks on phone: Not on file    Gets together: Not on file    Attends religious service: Not on file    Active member of club or organization: Not on file    Attends meetings of clubs or organizations: Not on file    Relationship status: Not on file  Other Topics Concern  . Not on file  Social History Narrative   Patient does not get regular exercise   Denies caffeine use    Past Surgical History:  Procedure Laterality Date  . ABDOMINAL HYSTERECTOMY    . Arm Surgery Bilateral    due to fall-pt broke both forearms  . BIOPSY  N/A 07/01/2013   Procedure: BIOPSY;  Surgeon: Rogene Houston, MD;  Location: [facility];  Service: Endoscopy;  Laterality: N/A;  . CHOLECYSTECTOMY    . COLONOSCOPY  08/06/2011   Procedure: COLONOSCOPY;  Surgeon: Rogene Houston, MD;  [facility]: [facility];  Service: Endoscopy;  Laterality: N/A;  215  . COLONOSCOPY WITH PROPOFOL N/A 12/31/2012   Procedure: COLONOSCOPY WITH PROPOFOL;  Surgeon: Rogene Houston, MD;  Location: [facility];  Service: Endoscopy;  Laterality: N/A;  in cecum at 0814, total withdrawal time 5mn  . CORONARY ANGIOPLASTY WITH STENT PLACEMENT    . ESOPHAGEAL DILATION N/A 06/07/2015   Procedure: ESOPHAGEAL DILATION;  Surgeon: NRogene Houston MD;  [facility];  Service: Endoscopy;  Laterality: N/A;  . ESOPHAGOGASTRODUODENOSCOPY N/A 06/07/2015   Procedure: ESOPHAGOGASTRODUODENOSCOPY (EGD);  Surgeon: NRogene Houston MD;  [facility];  Service: Endoscopy;  Laterality: N/A;  2:45 - moved to 1:00 - Ann to notify pt  . ESOPHAGOGASTRODUODENOSCOPY (EGD) WITH PROPOFOL N/A 12/31/2012   Procedure: ESOPHAGOGASTRODUODENOSCOPY (EGD) WITH PROPOFOL;  Surgeon: NRogene Houston MD;  Location: [facility];  Service: Endoscopy;  Laterality: N/A;  GE junction 37,  . ESOPHAGOGASTRODUODENOSCOPY (EGD) WITH PROPOFOL N/A 07/01/2013   Procedure: ESOPHAGOGASTRODUODENOSCOPY (EGD) WITH PROPOFOL;  Surgeon: NRogene Houston MD;  Location: [facility];  Service: Endoscopy;  Laterality: N/A;  950  . MALONEY DILATION N/A 12/31/2012   Procedure: MALONEY DILATION;  Surgeon: NRogene Houston MD;  Location: [facility];  Service: Endoscopy;  Laterality: N/A;  used a # 54,#56  . MALONEY DILATION N/A 07/01/2013   Procedure: MVenia MinksDILATION;  Surgeon: NRogene Houston MD;  Location: [facility];  Service: Endoscopy;  Laterality: N/A;  54/58; no heme present   Past Medical History:  Diagnosis Date  . Anxiety and depression   . Arthritis   . CAD (coronary artery disease)    Stent placement circumflex coronary 2007,  catheterization 2008 patent stents. Normal LV function  . Chest pain   . CH[place] ([place]estive heart failure) ([facility]   . COPD (chronic obstructive pulmonary disease) ([facility])    no home O2  . Depression   . Diabetes mellitus    Insulin dependent  . Diabetic polyneuropathy ([facility])     severe on multiple medications  . Dyslipidemia   . GERD (gastroesophageal reflux disease)   . Headache(784.0)   . 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 Hypertension   . MI (myocardial infarction) ([facility]    2007  . Obstructive sleep apnea    CPAP, setting ?2  . Palpitations   . Tachycardia    BP 132/80 (BP Location: Left Arm, Patient Position: Sitting, Cuff Size: Normal)   Pulse 79  Resp 14   Ht 5' 2"  (1.575 m)   Wt 232 lb (105.2 kg)   SpO2 95%   BMI 42.43 kg/m   Opioid Risk Score:   Fall Risk Score:  `1  Depression screen PHQ 2/9  Depression screen [facility] 2/9 06/12/2017 04/14/2017 02/17/2017 01/14/2017 12/24/2016 11/11/2016 10/28/2016  Decreased Interest 3 3 0 3 0 0 2  Down, Depressed, Hopeless 3 3 0 3 0 0 -  PHQ - 2 Score 6 6 0 6 0 0 2  Altered sleeping 3 - - - - - -  Tired, decreased energy 3 - - - - - -  Change in appetite 0 - - - - - -  Feeling bad or failure about yourself  3 - - - - - -  Trouble concentrating 3 - - - - - -  Moving slowly or fidgety/restless 1 - - - - - -  Suicidal thoughts 0 - - - - - -  PHQ-9 Score 19 - - - - - -  Difficult doing work/chores Very difficult - - - - - -  Some recent data might be hidden    Review of Systems  Constitutional: Positive for chills and diaphoresis.  HENT: Negative.   Eyes: Negative.   Respiratory: Negative.   Cardiovascular: Negative.   Gastrointestinal: Positive for abdominal pain, constipation, nausea and vomiting.  Endocrine: Negative.   Genitourinary: Negative.   Musculoskeletal: Positive for arthralgias, back pain, gait problem and neck pain.       Spasms   Skin: Negative.   Allergic/Immunologic: Negative.   Neurological: Positive for dizziness,  tremors, weakness and numbness.       Tingling   Hematological: Negative.   Psychiatric/Behavioral: Positive for confusion and dysphoric mood. The patient is nervous/anxious.   All other systems reviewed and are negative.      Objective:   Physical Exam  Constitutional: She is oriented to person, place, and time. She appears well-developed and well-nourished.  HENT:  Head: Normocephalic and atraumatic.  Neck: Normal range of motion. Neck supple.  Cardiovascular: Normal rate and regular rhythm.  Pulmonary/Chest: Effort normal and breath sounds normal.  Musculoskeletal:  Normal Muscle Bulk and Muscle Testing Reveals: Upper Extremities: Full ROM and Muscle Strength 5/5 Bilateral AC Joint Tenderness Thoracic Paraspinal Tenderness: T-1-T-3 T-7-T-9 Lumbar Paraspinal Tenderness: L-3-L-5 Bilateral Greater Trochanter Tenderness Lower Extremities: Decreased ROM and Muscle Strength 4/5 Bilateral Lower Extremities Flexion Produces Pain into Bilateral Lower Extremities.  Arises from Table Slowly Narrow Based Gait  Neurological: She is alert and oriented to person, place, and time.  Skin: Skin is warm and dry.  Psychiatric: She has a normal mood and affect. Her behavior is normal.  Nursing note and vitals reviewed.         Assessment & Plan:  1.Lumbar pain lumbar spondylosis: Encouraged to continue toincrease Activity as tolerated. 09/30/2017 Continue current medication regimen.Refilled: MS Contin 30 MG one tablet every 8 hours #90. Second script e-scribe to accommodate scheduled appointment. We will continue the opioid monitoring program, this consists of regular clinic visits, examinations, urine drug screen, pill counts as well as use of [place] Controlled Substance Reporting System. 2. Lumbar Radiculitis: Continuecurrent medication regimen withGabapentin.09/30/2017 3. Bilateral Knee Pain/ Degenerative: Continue Voltaren Gel. Continue to Monitor. 09/02/2017. 4. Severe  diabetic poly neuropathy: Continuecurrent medication regimen withGabapentin. 09/02/2017 5. Insomnia: ContinueTrazodone PsychiatryFollowing. 09/30/2017 6. Anxiety/ Depression: Clonazepam discontinued and Ativan prescribed: Continue Prozac andHydroxyzine. Psychiatry Following.09/30/2017 7. De Quervains Tenosynovitis: No Complaints Today. 09/30/2017 8. Bilateral  Greater Trochanteric Bursitis:Continue with Ice and Heat Therapy. Continue to Monitor. 09/30/2017.  30 minutes of face to face patient care time was spent during this visit. All questions were encouraged and answered.  F/U in 1 month

## 2017-10-01 LAB — COMPREHENSIVE METABOLIC PANEL
AG Ratio: 1.3 (calc) (ref 1.0–2.5)
ALT: 9 U/L (ref 6–29)
AST: 13 U/L (ref 10–35)
Albumin: 3.4 g/dL — ABNORMAL LOW (ref 3.6–5.1)
Alkaline phosphatase (APISO): 78 U/L (ref 33–130)
BUN: 11 mg/dL (ref 7–25)
CO2: 24 mmol/L (ref 20–32)
Calcium: 8.9 mg/dL (ref 8.6–10.4)
Chloride: 106 mmol/L (ref 98–110)
Creat: 0.67 mg/dL (ref 0.50–0.99)
Globulin: 2.7 g/dL (calc) (ref 1.9–3.7)
Glucose, Bld: 143 mg/dL — ABNORMAL HIGH (ref 65–99)
Potassium: 4.6 mmol/L (ref 3.5–5.3)
Sodium: 140 mmol/L (ref 135–146)
Total Bilirubin: 0.5 mg/dL (ref 0.2–1.2)
Total Protein: 6.1 g/dL (ref 6.1–8.1)

## 2017-10-01 LAB — HEMOGLOBIN A1C
Hgb A1c MFr Bld: 8.3 % of total Hgb — ABNORMAL HIGH (ref <5.7)
Mean Plasma Glucose: 192 (calc)
eAG (mmol/L): 10.6 (calc)

## 2017-10-07 ENCOUNTER — Ambulatory Visit (INDEPENDENT_AMBULATORY_CARE_PROVIDER_SITE_OTHER): Payer: Medicaid Other | Admitting: "Endocrinology

## 2017-10-07 ENCOUNTER — Encounter: Payer: Self-pay | Admitting: "Endocrinology

## 2017-10-07 VITALS — BP 139/89 | HR 71 | Ht 62.0 in | Wt 229.0 lb

## 2017-10-07 DIAGNOSIS — E782 Mixed hyperlipidemia: Secondary | ICD-10-CM

## 2017-10-07 DIAGNOSIS — E1165 Type 2 diabetes mellitus with hyperglycemia: Secondary | ICD-10-CM | POA: Diagnosis not present

## 2017-10-07 DIAGNOSIS — I10 Essential (primary) hypertension: Secondary | ICD-10-CM

## 2017-10-07 DIAGNOSIS — E1159 Type 2 diabetes mellitus with other circulatory complications: Secondary | ICD-10-CM | POA: Diagnosis not present

## 2017-10-07 MED ORDER — INSULIN GLARGINE 100 UNIT/ML SOLOSTAR PEN: 70.0000 [IU] | PEN_INJECTOR | Freq: Every day | SUBCUTANEOUS | 2 refills | Status: DC

## 2017-10-07 NOTE — Progress Notes (Signed)
Endocrinology follow-up note  Subjective:    Patient ID: [name], female    DOB: [birth year]. Patient is being seen in f/u for management of diabetes,hyperlipidemia, and hypertension.  Past Medical History:  Diagnosis Date  . Anxiety and depression   . Arthritis   . CAD (coronary artery disease)    Stent placement circumflex coronary 2007, catheterization 2008 patent stents. Normal LV function  . Chest pain   . CHF (congestive heart failure) ([place])   . COPD (chronic obstructive pulmonary disease) (HCC)    no home O2  . Depression   . Diabetes mellitus    Insulin dependent  . Diabetic polyneuropathy (HCC)     severe on multiple medications  . Dyslipidemia   . GERD (gastroesophageal reflux disease)   . Headache(784.0)   . 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 Hypertension   . MI (myocardial infarction) ([employer])    2007  . Obstructive sleep apnea    CPAP, setting ?2  . Palpitations   . Tachycardia    Past Surgical History:  Procedure Laterality Date  . ABDOMINAL HYSTERECTOMY    . Arm Surgery Bilateral    due to fall-pt broke both forearms  . BIOPSY N/A 07/01/2013   Procedure: BIOPSY;  Surgeon: Rogene Houston, MD;  Location: [facility];  Service: Endoscopy;  Laterality: N/A;  . CHOLECYSTECTOMY    . COLONOSCOPY  08/06/2011   Procedure: COLONOSCOPY;  Surgeon: Rogene Houston, MD;  Location: [facility];  Service: Endoscopy;  Laterality: N/A;  215  . COLONOSCOPY WITH PROPOFOL N/A 12/31/2012   Procedure: COLONOSCOPY WITH PROPOFOL;  Surgeon: Rogene Houston, MD;  Location: [facility];  Service: Endoscopy;  Laterality: N/A;  in cecum at 0814, total withdrawal time 51min  . CORONARY ANGIOPLASTY WITH STENT PLACEMENT    . ESOPHAGEAL DILATION N/A 06/07/2015   Procedure: ESOPHAGEAL DILATION;  Surgeon: Rogene Houston, MD;  [facility]: [facility];  Service: Endoscopy;  Laterality: N/A;  . ESOPHAGOGASTRODUODENOSCOPY N/A 06/07/2015   Procedure: ESOPHAGOGASTRODUODENOSCOPY (EGD);  Surgeon: Rogene Houston, MD;  [facility];  Service: Endoscopy;  Laterality: N/A;  2:45 - moved to 1:00 - Ann to notify pt  . ESOPHAGOGASTRODUODENOSCOPY (EGD) WITH PROPOFOL N/A 12/31/2012   Procedure: ESOPHAGOGASTRODUODENOSCOPY (EGD) WITH PROPOFOL;  Surgeon: Rogene Houston, MD;  Location: [facility];  Service: Endoscopy;  Laterality: N/A;  GE junction 37,  . ESOPHAGOGASTRODUODENOSCOPY (EGD) WITH PROPOFOL N/A 07/01/2013   Procedure: ESOPHAGOGASTRODUODENOSCOPY (EGD) WITH PROPOFOL;  Surgeon: Rogene Houston, MD;  Location: [facility];  Service: Endoscopy;  Laterality: N/A;  950  . MALONEY DILATION N/A 12/31/2012   Procedure: MALONEY DILATION;  Surgeon: Rogene Houston, MD;  Location: [facility];  Service: Endoscopy;  Laterality: N/A;  used a # 54,#56  . MALONEY DILATION N/A 07/01/2013   Procedure: Venia Minks DILATION;  Surgeon: Rogene Houston, MD;  Location: [facility];  Service: Endoscopy;  Laterality: N/A;  54/58; no heme present   Social History   Socioeconomic History  . Marital status: Legally Separated    Spouse name: Not on file  . Number of children: 4  . Years of education: Not on file  . Highest education level: Not on file  Occupational History  . Occupation: Disabled  Social Needs  . Financial resource strain: Not on file  . Food insecurity:    Worry: Not on file    Inability: Not on file  . Transportation needs:    Medical: Not on file  Non-medical: Not on file  Tobacco Use  . Smoking status: Never Smoker  . Smokeless tobacco: Never Used  Substance and Sexual Activity  . Alcohol use: No    Alcohol/week: 0.0 oz  . Drug use: No  . Sexual activity: Not on file  Lifestyle  . Physical activity:    Days per week: Not on file    Minutes per session: Not on file  . Stress: Not on file  Relationships  . Social connections:    Talks on phone: Not on file    Gets together: Not on file    Attends religious service: Not on file    Active member of club or organization: Not on file    Attends  meetings of clubs or organizations: Not on file    Relationship status: Not on file  Other Topics Concern  . Not on file  Social History Narrative   Patient does not get regular exercise   Denies caffeine use    Outpatient Encounter Medications as of 10/07/2017  Medication Sig  . ACCU-CHEK AVIVA PLUS test strip USE TO CHECK BLOOD SUGAR THREE TIMES DAILY - DX: E11.9  . aspirin EC 81 MG tablet Take 81 mg by mouth daily.  Marland Kitchen atorvastatin (LIPITOR) 40 MG tablet Take 40 mg by mouth daily.  Marland Kitchen buPROPion (WELLBUTRIN XL) 150 MG 24 hr tablet Take 150 mg by mouth daily.  . diclofenac sodium (VOLTAREN) 1 % GEL APPLY TO BOTH KNEES THREE TIMES DAILY  . DUREZOL 0.05 % EMUL INSTILL ONE DROP INTO THE RIGHT EYE THREE TIMES DAILY, TAPER OVER THREE WEEKS  . estrogens, conjugated, (PREMARIN) 1.25 MG tablet Take 1.25 mg by mouth daily.   . furosemide (LASIX) 20 MG tablet Take 20 mg by mouth daily as needed.  . gabapentin (NEURONTIN) 400 MG capsule Take 400 mg by mouth 3 (three) times daily.  Marland Kitchen GLOBAL EASE INJECT PEN NEEDLES 31G X 8 MM MISC USE AS DIRECTED FOUR TIMES DAILY  . hydrOXYzine (ATARAX/VISTARIL) 10 MG tablet Take 10 mg by mouth 4 (four) times daily as needed for anxiety.  . Insulin Glargine (LANTUS SOLOSTAR) 100 UNIT/ML Solostar Pen Inject 70 Units into the skin at bedtime.  . lidocaine (XYLOCAINE) 5 % ointment Apply 1 application topically at bedtime.   . meloxicam (MOBIC) 7.5 MG tablet Take 1 tablet (7.5 mg total) by mouth daily.  . metFORMIN (GLUCOPHAGE) 500 MG tablet TAKE ONE TABLET BY MOUTH TWICE DAILY with a meal  . methocarbamol (ROBAXIN) 500 MG tablet Take 1 tablet (500 mg total) by mouth 3 (three) times daily.  . metoprolol succinate (TOPROL-XL) 50 MG 24 hr tablet TAKE 1 AND 1/2 TABLETS BY MOUTH EVERY DAY  . montelukast (SINGULAIR) 10 MG tablet Take 10 mg by mouth daily.  Marland Kitchen morphine (MS CONTIN) 30 MG 12 hr tablet Take 1 tablet (30 mg total) by mouth every 8 (eight) hours.  . Multiple Vitamin  (MULTIVITAMIN WITH MINERALS) TABS tablet Take 1 tablet by mouth daily.  . nitroGLYCERIN (NITROSTAT) 0.4 MG SL tablet Place 1 tablet (0.4 mg total) under the tongue every 5 (five) minutes x 3 doses as needed. For chest pains  . ondansetron (ZOFRAN) 4 MG tablet Take 4 mg by mouth every 6 (six) hours as needed for nausea or vomiting.  . pantoprazole (PROTONIX) 40 MG tablet TAKE ONE TABLET BY MOUTH TWICE DAILY BEFORE MEALS  . potassium chloride (K-DUR,KLOR-CON) 10 MEQ tablet Take 10 mEq by mouth 2 (two) times daily.  Marland Kitchen PROAIR  HFA 108 (90 Base) MCG/ACT inhaler inhale TWO puffs FOUR TIMES DAILY AS NEEDED  . psyllium (METAMUCIL SMOOTH TEXTURE) 28 % packet Take 1 packet by mouth at bedtime.  . ranitidine (ZANTAC) 300 MG tablet Take 1 tablet (300 mg total) by mouth at bedtime as needed for heartburn.  . topiramate (TOPAMAX) 200 MG tablet Take 200 mg by mouth at bedtime.  . traZODone (DESYREL) 150 MG tablet Take 150 mg by mouth at bedtime.  . [DISCONTINUED] diltiazem (TIAZAC) 180 MG 24 hr capsule Take 1 capsule (180 mg total) by mouth daily.  . [DISCONTINUED] Insulin Glargine (LANTUS SOLOSTAR) 100 UNIT/ML Solostar Pen Inject 50 Units into the skin at bedtime.  . [DISCONTINUED] potassium chloride (K-DUR) 10 MEQ tablet Take 2 tablets (20 mEq total) by mouth 2 (two) times daily.   No facility-administered encounter medications on file as of 10/07/2017.    ALLERGIES: Allergies  Allergen Reactions  . Nitrofuran Derivatives Itching and Swelling  . Amphetamine-Dextroamphet Er Swelling  . Pregabalin Swelling  . Topiramate Other (See Comments)    Tongue tingle  . Verelan [Verapamil] Rash   VACCINATION STATUS:  There is no immunization history on file for this patient.  Diabetes  She presents for her follow-up diabetic visit. She has type 2 diabetes mellitus. Onset time: She was diagnosed at approximate age of 71 years. Her disease course has been worsening. There are no hypoglycemic associated symptoms.  Pertinent negatives for hypoglycemia include no confusion, headaches, pallor or seizures. Associated symptoms include fatigue, polydipsia and polyuria. Pertinent negatives for diabetes include no chest pain and no polyphagia. There are no hypoglycemic complications. Symptoms are worsening. Diabetic complications include autonomic neuropathy, heart disease, peripheral neuropathy, PVD and retinopathy. Risk factors for coronary artery disease include dyslipidemia, diabetes mellitus, obesity, hypertension and sedentary lifestyle. Current diabetic treatments: Lantus 65 units daily at bedtime, Humalog 15 units 3 times a day before meals. She mentions allergy to Victoza, Byetta, and metformin. Her weight is stable. She is following a generally unhealthy diet. When asked about meal planning, she reported none. She has not had a previous visit with a dietitian. She never participates in exercise. Her home blood glucose trend is increasing steadily. Her breakfast blood glucose range is generally 180-200 mg/dl. Her overall blood glucose range is 180-200 mg/dl. An ACE inhibitor/angiotensin II receptor blocker is not being taken. Eye exam is current.  Hyperlipidemia  This is a chronic problem. The current episode started more than 1 year ago. The problem is uncontrolled. Recent lipid tests were reviewed and are high. Exacerbating diseases include diabetes and obesity. Pertinent negatives include no chest pain, myalgias or shortness of breath. Current antihyperlipidemic treatment includes statins. Risk factors for coronary artery disease include diabetes mellitus, dyslipidemia, hypertension, obesity and a sedentary lifestyle.  Hypertension  This is a chronic problem. The current episode started more than 1 year ago. Pertinent negatives include no chest pain, headaches, palpitations or shortness of breath. Risk factors for coronary artery disease include diabetes mellitus, dyslipidemia, obesity and sedentary lifestyle.  Hypertensive end-organ damage includes CAD/MI, PVD and retinopathy.    Review of Systems  Constitutional: Positive for fatigue. Negative for chills, fever and unexpected weight change.  HENT: Negative for trouble swallowing and voice change.   Eyes: Negative for visual disturbance.  Respiratory: Negative for cough, chest tightness, shortness of breath and wheezing.   Cardiovascular: Negative for chest pain, palpitations and leg swelling.  Gastrointestinal: Negative for diarrhea, nausea and vomiting.  Endocrine: Positive for polydipsia and polyuria. Negative  for cold intolerance, heat intolerance and polyphagia.  Musculoskeletal: Negative for arthralgias and myalgias.  Skin: Negative for color change, pallor, rash and wound.  Neurological: Negative for seizures and headaches.  Psychiatric/Behavioral: Negative for confusion and suicidal ideas.    Objective:    BP 139/89   Pulse 71   Ht 5\' 2"  (1.575 m)   Wt 229 lb (103.9 kg)   BMI 41.88 kg/m   Wt Readings from Last 3 Encounters:  10/07/17 229 lb (103.9 kg)  09/30/17 232 lb (105.2 kg)  09/02/17 227 lb 3.2 oz (103.1 kg)    Physical Exam  Constitutional: She is oriented to person, place, and time. She appears well-developed.  HENT:  Head: Normocephalic and atraumatic.  She has very poor dentition.  Eyes: EOM are normal.  Neck: Normal range of motion. Neck supple. No tracheal deviation present. No thyromegaly present.  Cardiovascular: Normal rate.  No reproducible chest wall tenderness.  Pulmonary/Chest: Effort normal.  Abdominal: There is no tenderness. There is no guarding.  Musculoskeletal: Normal range of motion. She exhibits no edema.  Diabetes foot exam is abnormal with calluses, diminished monofilament sensation, dry skin, diminished to absent peripheral pulses, and long nails.  She follows with podiatry for nail clipping.  Neurological: She is alert and oriented to person, place, and time. No cranial nerve deficit.  Coordination normal.  Skin: Skin is warm and dry. No rash noted. No erythema. No pallor.  Psychiatric: She has a normal mood and affect. Judgment normal.    CMP     Component Value Date/Time   NA 140 09/30/2017 1515   NA 143 11/06/2016 1112   K 4.6 09/30/2017 1515   CL 106 09/30/2017 1515   CO2 24 09/30/2017 1515   GLUCOSE 143 (H) 09/30/2017 1515   BUN 11 09/30/2017 1515   BUN 11 11/06/2016 1112   CREATININE 0.67 09/30/2017 1515   CALCIUM 8.9 09/30/2017 1515   PROT 6.1 09/30/2017 1515   PROT 5.5 (L) 11/06/2016 1112   ALBUMIN 3.4 (L) 11/06/2016 1112   AST 13 09/30/2017 1515   ALT 9 09/30/2017 1515   ALKPHOS 76 11/06/2016 1112   BILITOT 0.5 09/30/2017 1515   BILITOT 0.3 11/06/2016 1112   GFRNONAA 87 05/11/2017 1125   GFRAA 101 05/11/2017 1125   Diabetic Labs (most recent): Lab Results  Component Value Date   HGBA1C 8.3 (H) 09/30/2017   HGBA1C 8.2 (H) 05/11/2017   HGBA1C 7.8 (H) 02/09/2017    Lipid Panel     Component Value Date/Time   CHOL 151 02/18/2017 0528   CHOL 184 04/15/2016 1121   TRIG 439 (H) 02/18/2017 0528   HDL 46 02/18/2017 0528   HDL 77 04/15/2016 1121   CHOLHDL 3.3 02/18/2017 0528   VLDL UNABLE TO CALCULATE IF TRIGLYCERIDE OVER 400 mg/dL 02/18/2017 0528   LDLCALC UNABLE TO CALCULATE IF TRIGLYCERIDE OVER 400 mg/dL 02/18/2017 0528   LDLCALC 50 04/15/2016 1121     Assessment & Plan:   1. Type 2 diabetes mellitus with vascular disease ([place]) - Patient has currently uncontrolled symptomatic type 2 DM since  67 years of age. - She came with above target blood glucose profile, A1c of 8.3% increasing from 7.8%.     Recent labs reviewed.  Her diabetes is complicated by obesity/sedentary life, coronary artery disease status post stent placement and patient remains at a high risk for more acute and chronic complications of diabetes which include CAD, CVA, CKD, retinopathy, and neuropathy. These are all discussed in  detail with the patient.  - I have  counseled the patient on diet management and weight loss, by adopting a carbohydrate restricted/protein rich diet.  -  Suggestion is made for her to avoid simple carbohydrates  from her diet including Cakes, Sweet Desserts / Pastries, Ice Cream, Soda (diet and regular), Sweet Tea, Candies, Chips, Cookies, Store Bought Juices, Alcohol in Excess of  1-2 drinks a day, Artificial Sweeteners, and "Sugar-free" Products. This will help patient to have stable blood glucose profile and potentially avoid unintended weight gain.  - I encouraged the patient to switch to  unprocessed or minimally processed complex starch and increased protein intake (animal or plant source), fruits, and vegetables.  - Patient is advised to stick to a routine mealtimes to eat 3 meals  a day and avoid unnecessary snacks ( to snack only to correct hypoglycemia).   - I have approached patient with the following individualized plan to manage diabetes and patient agrees:   - She has normal renal function. -She will tolerate more insulin.  I discussed and increase her Lantus to 70 units nightly associated with monitoring of blood glucose at least daily before breakfast and anytime as needed.  - Patient is warned not to take insulin without proper monitoring per orders. -Adjustment parameters are given for hypo and hyperglycemia in writing. -Patient is encouraged to call clinic for blood glucose levels less than 70 or above 200 mg /dl. -She has intolerance to GLP-1 receptor agonists. -She will continue on metformin 500 mg p.o. twice daily.    - Patient specific target  A1c;  LDL, HDL, Triglycerides, and  Waist Circumference were discussed in detail.  2) BP/HTN: Her blood pressure is controlled to target.   She has adequate medications, advised to continue including metoprolol XL 50 mg p.o. daily.Marland Kitchen   3) Lipids/HPL: She has uncontrolled hypertriglyceridemia , still high at 439  LDL better at 50, HDL at 77. Advised her to continue   Lipitor 40 mg by mouth daily at bedtime. Better control of diabetes will help for triglycerides.  4)  Weight/Diet: CDE Consult in progress  , exercise, and detailed carbohydrates information provided.  5) Chronic Care/Health Maintenance:  -Patient is on Statin medications and encouraged to continue to follow up with Ophthalmology, Podiatrist at least yearly or according to recommendations, and advised to   stay away from smoking. I have recommended yearly flu vaccine and pneumonia vaccination at least every 5 years; moderate intensity exercise for up to 150 minutes weekly; and  sleep for at least 7 hours a day. -She will benefit from a set of diabetic shoes, paperwork filled for her.  - I advised patient to maintain close follow up with Glenda Chroman, MD for primary care needs.  - Time spent with the patient: 25 min, of which >50% was spent in reviewing her blood glucose logs , discussing her hypo- and hyper-glycemic episodes, reviewing her current and  previous labs and insulin doses and developing a plan to avoid hypo- and hyper-glycemia. Please refer to Patient Instructions for Blood Glucose Monitoring and Insulin/Medications Dosing Guide"  in media tab for additional information. [name] participated in the discussions, expressed understanding, and voiced agreement with the above plans.  All questions were answered to her satisfaction. she is encouraged to contact clinic should she have any questions or concerns prior to her return visit.  Follow up plan: - Patient to go to emergency room for better evaluation of chest pain. Return in about 3  months (around 01/07/2018) for meter, and logs.  Glade Lloyd, MD Phone: [phone]  Fax: [phone]  This note was partially dictated with voice recognition software. Similar sounding words can be transcribed inadequately or may not  be corrected upon review.  10/07/2017, 5:21 PM

## 2017-10-07 NOTE — Patient Instructions (Signed)

## 2017-10-11 ENCOUNTER — Other Ambulatory Visit: Payer: Self-pay | Admitting: Registered Nurse

## 2017-11-06 ENCOUNTER — Encounter: Payer: Self-pay | Admitting: Registered Nurse

## 2017-11-06 ENCOUNTER — Encounter: Payer: Medicaid Other | Attending: Physical Medicine & Rehabilitation | Admitting: Registered Nurse

## 2017-11-06 VITALS — BP 149/75 | HR 63 | Ht 62.0 in | Wt 230.6 lb

## 2017-11-06 DIAGNOSIS — M7061 Trochanteric bursitis, right hip: Secondary | ICD-10-CM

## 2017-11-06 DIAGNOSIS — Z1401 Asymptomatic hemophilia A carrier: Secondary | ICD-10-CM | POA: Diagnosis not present

## 2017-11-06 DIAGNOSIS — R000 Tachycardia, unspecified: Secondary | ICD-10-CM | POA: Diagnosis not present

## 2017-11-06 DIAGNOSIS — M79604 Pain in right leg: Secondary | ICD-10-CM | POA: Insufficient documentation

## 2017-11-06 DIAGNOSIS — I2510 Atherosclerotic heart disease of native coronary artery without angina pectoris: Secondary | ICD-10-CM | POA: Insufficient documentation

## 2017-11-06 DIAGNOSIS — G4700 Insomnia, unspecified: Secondary | ICD-10-CM | POA: Diagnosis not present

## 2017-11-06 DIAGNOSIS — M7062 Trochanteric bursitis, left hip: Secondary | ICD-10-CM

## 2017-11-06 DIAGNOSIS — F411 Generalized anxiety disorder: Secondary | ICD-10-CM

## 2017-11-06 DIAGNOSIS — I252 Old myocardial infarction: Secondary | ICD-10-CM | POA: Insufficient documentation

## 2017-11-06 DIAGNOSIS — G894 Chronic pain syndrome: Secondary | ICD-10-CM

## 2017-11-06 DIAGNOSIS — Z760 Encounter for issue of repeat prescription: Secondary | ICD-10-CM | POA: Insufficient documentation

## 2017-11-06 DIAGNOSIS — M47896 Other spondylosis, lumbar region: Secondary | ICD-10-CM | POA: Insufficient documentation

## 2017-11-06 DIAGNOSIS — M79605 Pain in left leg: Secondary | ICD-10-CM | POA: Diagnosis not present

## 2017-11-06 DIAGNOSIS — G4733 Obstructive sleep apnea (adult) (pediatric): Secondary | ICD-10-CM | POA: Insufficient documentation

## 2017-11-06 DIAGNOSIS — M546 Pain in thoracic spine: Secondary | ICD-10-CM | POA: Diagnosis not present

## 2017-11-06 DIAGNOSIS — F418 Other specified anxiety disorders: Secondary | ICD-10-CM | POA: Insufficient documentation

## 2017-11-06 DIAGNOSIS — M5489 Other dorsalgia: Secondary | ICD-10-CM | POA: Diagnosis not present

## 2017-11-06 DIAGNOSIS — J449 Chronic obstructive pulmonary disease, unspecified: Secondary | ICD-10-CM | POA: Diagnosis not present

## 2017-11-06 DIAGNOSIS — M1712 Unilateral primary osteoarthritis, left knee: Secondary | ICD-10-CM | POA: Diagnosis not present

## 2017-11-06 DIAGNOSIS — K219 Gastro-esophageal reflux disease without esophagitis: Secondary | ICD-10-CM | POA: Diagnosis not present

## 2017-11-06 DIAGNOSIS — R51 Headache: Secondary | ICD-10-CM | POA: Diagnosis not present

## 2017-11-06 DIAGNOSIS — Z794 Long term (current) use of insulin: Secondary | ICD-10-CM | POA: Diagnosis not present

## 2017-11-06 DIAGNOSIS — E1142 Type 2 diabetes mellitus with diabetic polyneuropathy: Secondary | ICD-10-CM | POA: Diagnosis not present

## 2017-11-06 DIAGNOSIS — Z5181 Encounter for therapeutic drug level monitoring: Secondary | ICD-10-CM

## 2017-11-06 DIAGNOSIS — M5416 Radiculopathy, lumbar region: Secondary | ICD-10-CM

## 2017-11-06 DIAGNOSIS — R079 Chest pain, unspecified: Secondary | ICD-10-CM | POA: Diagnosis not present

## 2017-11-06 DIAGNOSIS — M1990 Unspecified osteoarthritis, unspecified site: Secondary | ICD-10-CM | POA: Diagnosis not present

## 2017-11-06 DIAGNOSIS — G8929 Other chronic pain: Secondary | ICD-10-CM | POA: Insufficient documentation

## 2017-11-06 DIAGNOSIS — Z9181 History of falling: Secondary | ICD-10-CM

## 2017-11-06 DIAGNOSIS — I509 Heart failure, unspecified: Secondary | ICD-10-CM | POA: Insufficient documentation

## 2017-11-06 DIAGNOSIS — R002 Palpitations: Secondary | ICD-10-CM | POA: Diagnosis not present

## 2017-11-06 DIAGNOSIS — F329 Major depressive disorder, single episode, unspecified: Secondary | ICD-10-CM

## 2017-11-06 DIAGNOSIS — E785 Hyperlipidemia, unspecified: Secondary | ICD-10-CM | POA: Insufficient documentation

## 2017-11-06 DIAGNOSIS — M1711 Unilateral primary osteoarthritis, right knee: Secondary | ICD-10-CM

## 2017-11-06 DIAGNOSIS — Z79899 Other long term (current) drug therapy: Secondary | ICD-10-CM

## 2017-11-06 DIAGNOSIS — R0781 Pleurodynia: Secondary | ICD-10-CM

## 2017-11-06 MED ORDER — MORPHINE SULFATE ER 30 MG PO TBCR: 30.0000 mg | EXTENDED_RELEASE_TABLET | Freq: Three times a day (TID) | ORAL | 0 refills | Status: DC

## 2017-11-06 NOTE — Progress Notes (Signed)
Subjective:    Patient ID: [name], female    DOB: 12-07-1950, 67 y.o.   MRN: [id number]  HPI: [name] is a 67 year old female who returns for follow up appointment for chronic pain and medication refill. She states she has right rib pain,  lower back and bilateral hips. Also reports she has a headache.   Ms. Hossain Morphine Equivalent is 90.00 MME. She is also prescribed Lorazepam by Noemi Chapel .We have discussed the black box warning of using opioids and benzodiazepines. I highlighted the dangers of using these drugs together and discussed the adverse events including respiratory suppression, overdose, cognitive impairment and importance of compliance with current regimen. We will continue to monitor and adjust as indicated. She is being closely monitored and under the care of her psychiatrist Dr. Casimiro Needle.   Also states she fell on 10/02/2017 she was in her kitchen walking to and slipped over her dog and fell forward. She went to [place] she reports.   Pain Inventory Average Pain 8 Pain Right Now 8 My pain is constant, sharp, burning, dull, stabbing, tingling and aching  In the last 24 hours, has pain interfered with the following? General activity 7 Relation with others 3 Enjoyment of life 3 What TIME of day is your pain at its worst? night Sleep (in general) Poor  Pain is worse with: bending and some activites Pain improves with: rest, heat/ice and medication Relief from Meds: 2  Mobility walk without assistance walk with assistance use a cane use a walker ability to climb steps?  no do you drive?  no  Function not employed: date last employed . I need assistance with the following:  dressing, meal prep, household duties and shopping  Neuro/Psych bladder control problems bowel control problems weakness numbness tingling trouble walking dizziness depression anxiety  Prior Studies x-rays CT/MRI  Physicians involved in your  care Any changes since last visit?  no   Family History  Problem Relation Age of Onset  . Heart attack Mother   . Colon cancer Mother   . Heart attack Father   . Stroke Sister   . Hemophilia Child   . Cancer Unknown   . Coronary artery disease Unknown   . Cancer Brother   . Cancer Maternal Aunt   . Cancer Maternal Uncle   . Cancer Paternal Aunt   . Cancer Paternal Uncle   . Cancer Brother    Social History   Socioeconomic History  . Marital status: Legally Separated    Spouse name: Not on file  . Number of children: 4  . Years of education: Not on file  . Highest education level: Not on file  Occupational History  . Occupation: Disabled  Social Needs  . Financial resource strain: Not on file  . Food insecurity:    Worry: Not on file    Inability: Not on file  . Transportation needs:    Medical: Not on file    Non-medical: Not on file  Tobacco Use  . Smoking status: Never Smoker  . Smokeless tobacco: Never Used  Substance and Sexual Activity  . Alcohol use: No    Alcohol/week: 0.0 standard drinks  . Drug use: No  . Sexual activity: Not on file  Lifestyle  . Physical activity:    Days per week: Not on file    Minutes per session: Not on file  . Stress: Not on file  Relationships  . Social connections:  Talks on phone: Not on file    Gets together: Not on file    Attends religious service: Not on file    Active member of club or organization: Not on file    Attends meetings of clubs or organizations: Not on file    Relationship status: Not on file  Other Topics Concern  . Not on file  Social History Narrative   Patient does not get regular exercise   Denies caffeine use    Past Surgical History:  Procedure Laterality Date  . ABDOMINAL HYSTERECTOMY    . Arm Surgery Bilateral    due to fall-pt broke both forearms  . BIOPSY N/A 07/01/2013   Procedure: BIOPSY;  Surgeon: Rogene Houston, MD;  [facility]: [facility];  Service: Endoscopy;  Laterality: N/A;  .  CHOLECYSTECTOMY    . COLONOSCOPY  08/06/2011   Procedure: COLONOSCOPY;  Surgeon: Rogene Houston, MD;  [facility];  Service: Endoscopy;  Laterality: N/A;  215  . COLONOSCOPY WITH PROPOFOL N/A 12/31/2012   Procedure: COLONOSCOPY WITH PROPOFOL;  Surgeon: Rogene Houston, MD;  Location: [facility];  Service: Endoscopy;  Laterality: N/A;  in cecum at 0814, total withdrawal time 66mn  . CORONARY ANGIOPLASTY WITH STENT PLACEMENT    . ESOPHAGEAL DILATION N/A 06/07/2015   Procedure: ESOPHAGEAL DILATION;  Surgeon: NRogene Houston MD;  [facility];  Service: Endoscopy;  Laterality: N/A;  . ESOPHAGOGASTRODUODENOSCOPY N/A 06/07/2015   Procedure: ESOPHAGOGASTRODUODENOSCOPY (EGD);  Surgeon: NRogene Houston MD;  [facility];  Service: Endoscopy;  Laterality: N/A;  2:45 - moved to 1:00 - Ann to notify pt  . ESOPHAGOGASTRODUODENOSCOPY (EGD) WITH PROPOFOL N/A 12/31/2012   Procedure: ESOPHAGOGASTRODUODENOSCOPY (EGD) WITH PROPOFOL;  Surgeon: NRogene Houston MD;  [facility];  Service: Endoscopy;  Laterality: N/A;  GE junction 37,  . ESOPHAGOGASTRODUODENOSCOPY (EGD) WITH PROPOFOL N/A 07/01/2013   Procedure: ESOPHAGOGASTRODUODENOSCOPY (EGD) WITH PROPOFOL;  Surgeon: NRogene Houston MD;  [facility];  Service: Endoscopy;  Laterality: N/A;  950  . MALONEY DILATION N/A 12/31/2012   Procedure: MALONEY DILATION;  Surgeon: NRogene Houston MD;  [facility];  Service: Endoscopy;  Laterality: N/A;  used a # 54,#56  . MALONEY DILATION N/A 07/01/2013   Procedure: MVenia MinksDILATION;  Surgeon: NRogene Houston MD;  Location: [facility];  Service: Endoscopy;  Laterality: N/A;  54/58; no heme present   Past Medical History:  Diagnosis Date  . Anxiety and depression   . Arthritis   . CAD (coronary artery disease)    Stent placement circumflex coronary 2007, catheterization 2008 patent stents. Normal LV function  . Chest pain   . [place] failure) [place]   . COPD (chronic  obstructive pulmonary disease) (H[facility])    no home O2  . Depression   . Diabetes mellitus    Insulin dependent  . Diabetic polyneuropathy (HCC)     severe on multiple medications  . Dyslipidemia   . GERD (gastroesophageal reflux disease)   . Headache(784.0)   . 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 Hypertension   . MI (myocardial infarction) ([facility]    2007  . Obstructive sleep apnea    CPAP, setting ?2  . Palpitations   . Tachycardia    BP (!) 149/75   Pulse 63   Ht 5' 2"  (1.575 m)   Wt 230 lb 9.6 oz (104.6 kg)   SpO2 96%   BMI 42.18 kg/m   Opioid Risk  Score:   Fall Risk Score:  `1  Depression screen PHQ 2/9  Depression screen [facility] 2/9 06/12/2017 04/14/2017 02/17/2017 01/14/2017 12/24/2016 11/11/2016 10/28/2016  Decreased Interest 3 3 0 3 0 0 2  Down, Depressed, Hopeless 3 3 0 3 0 0 -  PHQ - 2 Score 6 6 0 6 0 0 2  Altered sleeping 3 - - - - - -  Tired, decreased energy 3 - - - - - -  Change in appetite 0 - - - - - -  Feeling bad or failure about yourself  3 - - - - - -  Trouble concentrating 3 - - - - - -  Moving slowly or fidgety/restless 1 - - - - - -  Suicidal thoughts 0 - - - - - -  PHQ-9 Score 19 - - - - - -  Difficult doing work/chores Very difficult - - - - - -  Some recent data might be hidden     Review of Systems  Constitutional: Negative.   HENT: Negative.   Eyes: Negative.   Respiratory: Negative.   Cardiovascular: Positive for chest pain.  Gastrointestinal: Negative.   Endocrine: Negative.   Genitourinary: Positive for difficulty urinating.  Musculoskeletal: Positive for arthralgias, back pain, gait problem and myalgias.  Skin: Negative.   Allergic/Immunologic: Negative.   Neurological: Positive for dizziness, weakness and numbness.  Hematological: Negative.   Psychiatric/Behavioral: Positive for dysphoric mood. The patient is nervous/anxious.   All other systems reviewed and are negative.      Objective:   Physical Exam  Constitutional: She is oriented to  person, place, and time. She appears well-developed and well-nourished.  HENT:  Head: Normocephalic and atraumatic.  Neck: Normal range of motion. Neck supple.  Cardiovascular: Normal rate and regular rhythm.  Pulmonary/Chest: Effort normal and breath sounds normal.  Musculoskeletal:  Normal Muscle Bulk and Muscle Testing Reveals: Upper Extremities: Full ROM and Muscle Strength 5/5 Thoracic Paraspinal Tenderness: T-3-T-7 Lumbar Hypersensitivity Lower Extremities: Full ROM and Muscle Strength 5/5 Arises from Table Slowly Antalgic Gait   Neurological: She is alert and oriented to person, place, and time.  Skin: Skin is warm and dry.  Psychiatric: She has a normal mood and affect. Her behavior is normal.  Nursing note and vitals reviewed.         Assessment & Plan:  1.Lumbar pain lumbar spondylosis: Encouraged to continue toincrease Activity as tolerated. 11/06/2017 Continue current medication regimen.Refilled: MS Contin 30 MG one tablet every 8 hours #90. We will continue the opioid monitoring program, this consists of regular clinic visits, examinations, urine drug screen, pill counts as well as use of [place] Controlled Substance Reporting System. 2. Lumbar Radiculitis: Continuecurrent medication regimen withGabapentin.11/06/2017 3. Bilateral Knee Pain/ Degenerative: Continue Voltaren Gel. Continue to Monitor. 11/06/2017. 4. Severe diabetic poly neuropathy: Continuecurrent medication regimen withGabapentin. 11/06/2017 5. Insomnia: ContinueTrazodone PsychiatryFollowing. 11/06/2017 6. Anxiety/ Depression: Lorazepam,Prozac andHydroxyzine Prescribed by Psychiatry.0823/2019 7. De Quervains Tenosynovitis: No Complaints Today. 11/06/2017 8. Bilateral Greater Trochanteric Bursitis:Continue with Ice and Heat Therapy. Continue to Monitor. 11/06/2017.  30 minutes of face to face patient care time was spent during this visit. All questions were encouraged and  answered.  F/U in 1 month

## 2017-11-10 ENCOUNTER — Other Ambulatory Visit: Payer: Self-pay | Admitting: "Endocrinology

## 2017-11-14 ENCOUNTER — Other Ambulatory Visit: Payer: Self-pay | Admitting: "Endocrinology

## 2017-11-30 ENCOUNTER — Ambulatory Visit: Payer: Medicaid Other | Admitting: Registered Nurse

## 2017-12-02 ENCOUNTER — Encounter: Payer: Medicaid Other | Admitting: Registered Nurse

## 2017-12-07 ENCOUNTER — Encounter (INDEPENDENT_AMBULATORY_CARE_PROVIDER_SITE_OTHER): Payer: Self-pay | Admitting: *Deleted

## 2017-12-07 ENCOUNTER — Encounter: Payer: Medicaid Other | Attending: Physical Medicine & Rehabilitation | Admitting: Registered Nurse

## 2017-12-07 ENCOUNTER — Emergency Department (HOSPITAL_COMMUNITY): Payer: Medicaid Other

## 2017-12-07 ENCOUNTER — Encounter (HOSPITAL_COMMUNITY): Payer: Self-pay | Admitting: Emergency Medicine

## 2017-12-07 ENCOUNTER — Encounter: Payer: Self-pay | Admitting: Registered Nurse

## 2017-12-07 ENCOUNTER — Emergency Department (HOSPITAL_COMMUNITY)
Admission: EM | Admit: 2017-12-07 | Discharge: 2017-12-07 | Disposition: A | Discharge status: Home / Self Care | Payer: Medicaid Other | Attending: Emergency Medicine | Admitting: Emergency Medicine

## 2017-12-07 VITALS — BP 178/93 | HR 78 | Resp 14

## 2017-12-07 DIAGNOSIS — I252 Old myocardial infarction: Secondary | ICD-10-CM | POA: Insufficient documentation

## 2017-12-07 DIAGNOSIS — R079 Chest pain, unspecified: Secondary | ICD-10-CM | POA: Diagnosis not present

## 2017-12-07 DIAGNOSIS — I2510 Atherosclerotic heart disease of native coronary artery without angina pectoris: Secondary | ICD-10-CM | POA: Insufficient documentation

## 2017-12-07 DIAGNOSIS — Z79899 Other long term (current) drug therapy: Secondary | ICD-10-CM

## 2017-12-07 DIAGNOSIS — M5489 Other dorsalgia: Secondary | ICD-10-CM | POA: Insufficient documentation

## 2017-12-07 DIAGNOSIS — K219 Gastro-esophageal reflux disease without esophagitis: Secondary | ICD-10-CM | POA: Diagnosis not present

## 2017-12-07 DIAGNOSIS — Z5181 Encounter for therapeutic drug level monitoring: Secondary | ICD-10-CM

## 2017-12-07 DIAGNOSIS — G4700 Insomnia, unspecified: Secondary | ICD-10-CM | POA: Diagnosis not present

## 2017-12-07 DIAGNOSIS — J449 Chronic obstructive pulmonary disease, unspecified: Secondary | ICD-10-CM | POA: Diagnosis not present

## 2017-12-07 DIAGNOSIS — E785 Hyperlipidemia, unspecified: Secondary | ICD-10-CM | POA: Diagnosis not present

## 2017-12-07 DIAGNOSIS — M545 Low back pain, unspecified: Secondary | ICD-10-CM

## 2017-12-07 DIAGNOSIS — M546 Pain in thoracic spine: Secondary | ICD-10-CM | POA: Diagnosis not present

## 2017-12-07 DIAGNOSIS — E1140 Type 2 diabetes mellitus with diabetic neuropathy, unspecified: Secondary | ICD-10-CM | POA: Insufficient documentation

## 2017-12-07 DIAGNOSIS — Z760 Encounter for issue of repeat prescription: Secondary | ICD-10-CM | POA: Diagnosis not present

## 2017-12-07 DIAGNOSIS — M7062 Trochanteric bursitis, left hip: Secondary | ICD-10-CM

## 2017-12-07 DIAGNOSIS — I509 Heart failure, unspecified: Secondary | ICD-10-CM | POA: Diagnosis not present

## 2017-12-07 DIAGNOSIS — M47896 Other spondylosis, lumbar region: Secondary | ICD-10-CM | POA: Insufficient documentation

## 2017-12-07 DIAGNOSIS — G8929 Other chronic pain: Secondary | ICD-10-CM

## 2017-12-07 DIAGNOSIS — Z794 Long term (current) use of insulin: Secondary | ICD-10-CM | POA: Insufficient documentation

## 2017-12-07 DIAGNOSIS — M1712 Unilateral primary osteoarthritis, left knee: Secondary | ICD-10-CM | POA: Diagnosis not present

## 2017-12-07 DIAGNOSIS — R000 Tachycardia, unspecified: Secondary | ICD-10-CM | POA: Insufficient documentation

## 2017-12-07 DIAGNOSIS — Z1401 Asymptomatic hemophilia A carrier: Secondary | ICD-10-CM | POA: Diagnosis not present

## 2017-12-07 DIAGNOSIS — F418 Other specified anxiety disorders: Secondary | ICD-10-CM | POA: Insufficient documentation

## 2017-12-07 DIAGNOSIS — M79604 Pain in right leg: Secondary | ICD-10-CM | POA: Insufficient documentation

## 2017-12-07 DIAGNOSIS — I110 Hypertensive heart disease with heart failure: Secondary | ICD-10-CM | POA: Diagnosis not present

## 2017-12-07 DIAGNOSIS — F329 Major depressive disorder, single episode, unspecified: Secondary | ICD-10-CM

## 2017-12-07 DIAGNOSIS — R296 Repeated falls: Secondary | ICD-10-CM

## 2017-12-07 DIAGNOSIS — G4733 Obstructive sleep apnea (adult) (pediatric): Secondary | ICD-10-CM | POA: Insufficient documentation

## 2017-12-07 DIAGNOSIS — M79605 Pain in left leg: Secondary | ICD-10-CM | POA: Diagnosis not present

## 2017-12-07 DIAGNOSIS — M7061 Trochanteric bursitis, right hip: Secondary | ICD-10-CM | POA: Diagnosis not present

## 2017-12-07 DIAGNOSIS — R112 Nausea with vomiting, unspecified: Secondary | ICD-10-CM

## 2017-12-07 DIAGNOSIS — E1142 Type 2 diabetes mellitus with diabetic polyneuropathy: Secondary | ICD-10-CM | POA: Diagnosis not present

## 2017-12-07 DIAGNOSIS — R51 Headache: Secondary | ICD-10-CM | POA: Insufficient documentation

## 2017-12-07 DIAGNOSIS — R531 Weakness: Secondary | ICD-10-CM

## 2017-12-07 DIAGNOSIS — M5416 Radiculopathy, lumbar region: Secondary | ICD-10-CM | POA: Diagnosis not present

## 2017-12-07 DIAGNOSIS — R002 Palpitations: Secondary | ICD-10-CM | POA: Insufficient documentation

## 2017-12-07 DIAGNOSIS — M1990 Unspecified osteoarthritis, unspecified site: Secondary | ICD-10-CM | POA: Insufficient documentation

## 2017-12-07 DIAGNOSIS — G894 Chronic pain syndrome: Secondary | ICD-10-CM

## 2017-12-07 DIAGNOSIS — F411 Generalized anxiety disorder: Secondary | ICD-10-CM

## 2017-12-07 DIAGNOSIS — M1711 Unilateral primary osteoarthritis, right knee: Secondary | ICD-10-CM

## 2017-12-07 LAB — URINALYSIS, ROUTINE W REFLEX MICROSCOPIC
Bilirubin Urine: NEGATIVE
Glucose, UA: NEGATIVE mg/dL
Hgb urine dipstick: NEGATIVE
Ketones, ur: NEGATIVE mg/dL
Leukocytes, UA: NEGATIVE
Nitrite: NEGATIVE
Protein, ur: NEGATIVE mg/dL
Specific Gravity, Urine: 1.015 (ref 1.005–1.030)
pH: 7 (ref 5.0–8.0)

## 2017-12-07 LAB — COMPREHENSIVE METABOLIC PANEL
ALT: 12 U/L (ref 0–44)
AST: 13 U/L — ABNORMAL LOW (ref 15–41)
Albumin: 2.9 g/dL — ABNORMAL LOW (ref 3.5–5.0)
Alkaline Phosphatase: 65 U/L (ref 38–126)
Anion gap: 13 (ref 5–15)
BUN: 10 mg/dL (ref 8–23)
CO2: 25 mmol/L (ref 22–32)
Calcium: 9.2 mg/dL (ref 8.9–10.3)
Chloride: 106 mmol/L (ref 98–111)
Creatinine, Ser: 0.66 mg/dL (ref 0.44–1.00)
GFR calc Af Amer: >60 mL/min (ref >60)
GFR calc non Af Amer: >60 mL/min (ref >60)
Glucose, Bld: 141 mg/dL — ABNORMAL HIGH (ref 70–99)
Potassium: 3.5 mmol/L (ref 3.5–5.1)
Sodium: 144 mmol/L (ref 135–145)
Total Bilirubin: 0.6 mg/dL (ref 0.3–1.2)
Total Protein: 6.6 g/dL (ref 6.5–8.1)

## 2017-12-07 LAB — CBC
HCT: 39.6 % (ref 36.0–46.0)
Hemoglobin: 12.6 g/dL (ref 12.0–15.0)
MCH: 26.5 pg (ref 26.0–34.0)
MCHC: 31.8 g/dL (ref 30.0–36.0)
MCV: 83.4 fL (ref 78.0–100.0)
Platelets: 369 10*3/uL (ref 150–400)
RBC: 4.75 MIL/uL (ref 3.87–5.11)
RDW: 13.9 % (ref 11.5–15.5)
WBC: 7.8 10*3/uL (ref 4.0–10.5)

## 2017-12-07 LAB — LIPASE, BLOOD: Lipase: 25 U/L (ref 11–51)

## 2017-12-07 MED ORDER — OXYCODONE HCL 5 MG PO TABS
10.0000 mg | ORAL_TABLET | Freq: Once | ORAL | Status: AC
  Administered 2017-12-07: 10 mg via ORAL
  Filled 2017-12-07: qty 2

## 2017-12-07 MED ORDER — ONDANSETRON 4 MG PO TBDP
4.0000 mg | ORAL_TABLET | Freq: Once | ORAL | Status: AC
  Administered 2017-12-07: 4 mg via ORAL
  Filled 2017-12-07: qty 1

## 2017-12-07 NOTE — ED Notes (Signed)
Patient reports she has two falls this week and one in July. Patient's boyfriend reports patient has been vomiting 3-4 times a day, this has been occurring about every other day. Denies hitting her head from these last two falls this week.

## 2017-12-07 NOTE — Patient Instructions (Signed)
Please Call Office in the morning : No Prescription Given

## 2017-12-07 NOTE — ED Notes (Signed)
Called Lab and told them that no requisition was found for urine micro, but there is an order for it.

## 2017-12-07 NOTE — ED Triage Notes (Signed)
Pt states that she had multiple falls due to leg weakness. Reports that she has trouble getting off the toilet also due to her leg weakness.  Pt c/o back, abd, bilat leg and feet pain.

## 2017-12-07 NOTE — ED Notes (Signed)
Patient transported to radiology

## 2017-12-07 NOTE — ED Provider Notes (Signed)
[facility] Emergency Department Provider Note MRN:  [id number]  Arrival date & time: 12/07/17     Chief Complaint   Extremity Weakness and Fall   History of Present Illness   [name] is a 67 y.o. year-old female with a history of chronic back pain, diabetes, CAD presenting to the ED with chief complaint of weakness, frequent falls.  Patient explains that she has been experiencing persistent intermittent nausea and vomiting for about a year.  The frequency of her vomiting has progressed over the course of the past month.  No vomiting every other day.  Denies abdominal pain, no diarrhea.  No fevers.  Nonbloody nonbilious emesis.  Over the course the past 1 to 2 weeks, patient has fallen 3 times.  Uses a walker at baseline.  Denies dizziness or chest pain prior to falling.  Explains that she just feels very weak in her legs, her legs give out, and she stumbles.  Had mild head trauma during her first fall in July, which was evaluated in the emergency department with a CT scan.  No further head trauma, no other significant injuries from the fall.  She is endorsing worsening of her chronic back pain since last fall.  Moderate to severe, constant, worse with motion, located in the lower back and tailbone.  Review of Systems  A complete 10 system review of systems was obtained and all systems are negative except as noted in the HPI and PMH.   Patient's Health History    Past Medical History:  Diagnosis Date  . Anxiety and depression   . Arthritis   . CAD (coronary artery disease)    Stent placement circumflex coronary 2007, catheterization 2008 patent stents. Normal LV function  . Chest pain   . CHF (congestive heart failure) ([place])   . COPD (chronic obstructive pulmonary disease) (HCC)    no home O2  . Depression   . Diabetes mellitus    Insulin dependent  . Diabetic polyneuropathy (HCC)     severe on multiple medications  . Dyslipidemia   . GERD  (gastroesophageal reflux disease)   . Headache(784.0)   . 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 Hypertension   . MI (myocardial infarction) ([place])    2007  . Obstructive sleep apnea    CPAP, setting ?2  . Palpitations   . Tachycardia     Past Surgical History:  Procedure Laterality Date  . ABDOMINAL HYSTERECTOMY    . Arm Surgery Bilateral    due to fall-pt broke both forearms  . BIOPSY N/A 07/01/2013   Procedure: BIOPSY;  Surgeon: Rogene Houston, MD;  [facility];  Service: Endoscopy;  Laterality: N/A;  . CHOLECYSTECTOMY    . COLONOSCOPY  08/06/2011   Procedure: COLONOSCOPY;  Surgeon: Rogene Houston, MD;  [facility];  Service: Endoscopy;  Laterality: N/A;  215  . COLONOSCOPY WITH PROPOFOL N/A 12/31/2012   Procedure: COLONOSCOPY WITH PROPOFOL;  Surgeon: Rogene Houston, MD;  Location: [facility];  Service: Endoscopy;  Laterality: N/A;  in cecum at 0814, total withdrawal time 46mn  . CORONARY ANGIOPLASTY WITH STENT PLACEMENT    . ESOPHAGEAL DILATION N/A 06/07/2015   Procedure: ESOPHAGEAL DILATION;  Surgeon: NRogene Houston MD;  [facility];  Service: Endoscopy;  Laterality: N/A;  . ESOPHAGOGASTRODUODENOSCOPY N/A 06/07/2015   Procedure: ESOPHAGOGASTRODUODENOSCOPY (EGD);  Surgeon: NRogene Houston MD;  [facility];  Service: Endoscopy;  Laterality: N/A;  2:45 -  moved to 1:00 - Ann to notify pt  . ESOPHAGOGASTRODUODENOSCOPY (EGD) WITH PROPOFOL N/A 12/31/2012   Procedure: ESOPHAGOGASTRODUODENOSCOPY (EGD) WITH PROPOFOL;  Surgeon: Rogene Houston, MD;  [facility];  Service: Endoscopy;  Laterality: N/A;  GE junction 37,  . ESOPHAGOGASTRODUODENOSCOPY (EGD) WITH PROPOFOL N/A 07/01/2013   Procedure: ESOPHAGOGASTRODUODENOSCOPY (EGD) WITH PROPOFOL;  Surgeon: Rogene Houston, MD;  [facility];  Service: Endoscopy;  Laterality: N/A;  950  . MALONEY DILATION N/A 12/31/2012   Procedure: MALONEY DILATION;  Surgeon: Rogene Houston, MD;  [facility]: [facility];  Service:  Endoscopy;  Laterality: N/A;  used a # 54,#56  . MALONEY DILATION N/A 07/01/2013   Procedure: Venia Minks DILATION;  Surgeon: Rogene Houston, MD;  Location: [facility];  Service: Endoscopy;  Laterality: N/A;  54/58; no heme present    Family History  Problem Relation Age of Onset  . Heart attack Mother   . Colon cancer Mother   . Heart attack Father   . Stroke Sister   . Hemophilia Child   . Cancer Unknown   . Coronary artery disease Unknown   . Cancer Brother   . Cancer Maternal Aunt   . Cancer Maternal Uncle   . Cancer Paternal Aunt   . Cancer Paternal Uncle   . Cancer Brother     Social History   Socioeconomic History  . Marital status: Legally Separated    Spouse name: Not on file  . Number of children: 4  . Years of education: Not on file  . Highest education level: Not on file  Occupational History  . Occupation: Disabled  Social Needs  . Financial resource strain: Not on file  . Food insecurity:    Worry: Not on file    Inability: Not on file  . Transportation needs:    Medical: Not on file    Non-medical: Not on file  Tobacco Use  . Smoking status: Never Smoker  . Smokeless tobacco: Never Used  Substance and Sexual Activity  . Alcohol use: No    Alcohol/week: 0.0 standard drinks  . Drug use: No  . Sexual activity: Not on file  Lifestyle  . Physical activity:    Days per week: Not on file    Minutes per session: Not on file  . Stress: Not on file  Relationships  . Social connections:    Talks on phone: Not on file    Gets together: Not on file    Attends religious service: Not on file    Active member of club or organization: Not on file    Attends meetings of clubs or organizations: Not on file    Relationship status: Not on file  . Intimate partner violence:    Fear of current or ex partner: Not on file    Emotionally abused: Not on file    Physically abused: Not on file    Forced sexual activity: Not on file  Other Topics Concern  . Not on file    Social History Narrative   Patient does not get regular exercise   Denies caffeine use      Physical Exam  Vital Signs and Nursing Notes reviewed Vitals:   12/07/17 1412 12/07/17 1527  BP: (!) 173/86 (!) 159/70  Pulse: 80 75  Resp: 17 18  Temp: 99 F (37.2 C) 98.3 F (36.8 C)  SpO2: 98% 98%    CONSTITUTIONAL: Well-appearing, NAD NEURO:  Alert and oriented x 3, no focal deficits, symmetric decreased  range of motion of the lower extremities with questionable effort, likely reduced due to pain EYES:  eyes equal and reactive ENT/NECK:  no LAD, no JVD CARDIO: Regular rate, well-perfused, normal S1 and S2 PULM:  CTAB no wheezing or rhonchi GI/GU:  normal bowel sounds, non-distended, non-tender MSK/SPINE:  No gross deformities, no edema, moderate midline lumbar tenderness SKIN:  no rash, atraumatic PSYCH:  Appropriate speech and behavior  Diagnostic and Interventional Summary    EKG Interpretation  Date/Time:    Ventricular Rate:    PR Interval:    QRS Duration:   QT Interval:    QTC Calculation:   R Axis:     Text Interpretation:        Labs Reviewed  COMPREHENSIVE METABOLIC PANEL - Abnormal; Notable for the following components:      Result Value   Glucose, Bld 141 (*)    Albumin 2.9 (*)    AST 13 (*)    All other components within normal limits  URINALYSIS, ROUTINE W REFLEX MICROSCOPIC - Abnormal; Notable for the following components:   APPearance HAZY (*)    All other components within normal limits  LIPASE, BLOOD  CBC    DG Lumbar Spine Complete  Final Result      Medications  oxyCODONE (Oxy IR/ROXICODONE) immediate release tablet 10 mg (10 mg Oral Given 12/07/17 1552)  ondansetron (ZOFRAN-ODT) disintegrating tablet 4 mg (4 mg Oral Given 12/07/17 1546)     Procedures Critical Care  ED Course and Medical Decision Making  I have reviewed the triage vital signs and the nursing notes.  Pertinent labs & imaging results that were available during my care  of the patient were reviewed by me and considered in my medical decision making (see below for details).    Unclear etiology of patient's chronic nausea and vomiting, will obtain labs to exclude metabolic disarray of the origin of her generalized weakness.  Acute on chronic back pain with moderate tenderness, will screen with x-rays.  No bowel or bladder dysfunction, no saddle anesthesia, no concern for myelopathy.  X-ray unremarkable, labs within normal limits.  Patient feeling better, with improved strength exam.  We will follow-up with her pain management doctor as well as PCP.  After the discussed management above, the patient was determined to be safe for discharge.  The patient was in agreement with this plan and all questions regarding their care were answered.  ED return precautions were discussed and the patient will return to the ED with any significant worsening of condition.  Barth Kirks. Sedonia Small, [place] [email or web address]  Final Clinical Impressions(s) / ED Diagnoses     ICD-10-CM   1. Non-intractable vomiting with nausea, unspecified vomiting type R11.2   2. Chronic low back pain without sciatica, unspecified back pain laterality M54.5    G89.29   3. Weakness R53.1     ED Discharge Orders    None         Maudie Flakes, MD 12/07/17 1654

## 2017-12-07 NOTE — ED Triage Notes (Signed)
Per EMS-patient was at PCP's office for symptoms of nausea and vomiting for 2 weeks--patient has a history of IBS and gastritis-was prescribed Zofran but it hasn't been working-sent here for further eval

## 2017-12-07 NOTE — Progress Notes (Signed)
Subjective:    Patient ID: [name], female    DOB: 03-13-1951, 67 y.o.   MRN: [id number]  HPI: [name] is a 67 year old female who returns for follow up appointment for chronic ain and medication refill. She states her pain is located in her entire back radiating into her buttocks, bilateral hips and bilateral lower extremities. She rates her pain 7. Her current exercise regime is walking short distances she reports.   [name] arrived to office in a wheelchair upon registering she vomited, she vomited a total of three times in office bilious vomitus. Also reports since the weekend she has vomited daily three times a day. States this has been going on for a few weeks she seen her PCP and was prescribed Zofran she reports.   EMS was called and she was transferred to [facility] ED for evaluation.   Also reports on Wednesday she tripped over door threshold and landed on her knees, her friend helped her up. On Sunday she was leaning over her walker to pick up a piece a paper from the floor when the walker gave way and she fell backwards and landed on her buttock and back she reports. Her brother in law and friend helped her up, she didn't seek medical attention.   [name] Morphine equivalent is 90.00 MME. She is also prescribed Lorazepam  by Noemi Chapel .We have discussed the black box warning of using opioids and benzodiazepines. I highlighted the dangers of using these drugs together and discussed the adverse events including respiratory suppression, overdose, cognitive impairment and importance of compliance with current regimen. We will continue to monitor and adjust as indicated.   She is being closely monitored and under the care of her psychiatrist Dr. Casimiro Needle.   Pain Inventory Average Pain 8 Pain Right Now 7 My pain is constant, sharp, burning, dull, stabbing, tingling and aching  In the last 24 hours, has pain interfered with the following? General activity  6 Relation with others 3 Enjoyment of life 7 What TIME of day is your pain at its worst? morning, night  Sleep (in general) Poor  Pain is worse with: some activites Pain improves with: medication Relief from Meds: 5  Mobility walk with assistance use a cane use a walker how many minutes can you walk? 5 ability to climb steps?  no do you drive?  no needs help with transfers Do you have any goals in this area?  no  Function not employed: date last employed . disabled: date disabled . I need assistance with the following:  meal prep, household duties and shopping  Neuro/Psych bladder control problems weakness numbness tingling trouble walking dizziness confusion depression anxiety  Prior Studies Any changes since last visit?  no  Physicians involved in your care Any changes since last visit?  no   Family History  Problem Relation Age of Onset  . Heart attack Mother   . Colon cancer Mother   . Heart attack Father   . Stroke Sister   . Hemophilia Child   . Cancer Unknown   . Coronary artery disease Unknown   . Cancer Brother   . Cancer Maternal Aunt   . Cancer Maternal Uncle   . Cancer Paternal Aunt   . Cancer Paternal Uncle   . Cancer Brother    Social History   Socioeconomic History  . Marital status: Legally Separated    Spouse name: Not on file  . Number of children:  4  . Years of education: Not on file  . Highest education level: Not on file  Occupational History  . Occupation: Disabled  Social Needs  . Financial resource strain: Not on file  . Food insecurity:    Worry: Not on file    Inability: Not on file  . Transportation needs:    Medical: Not on file    Non-medical: Not on file  Tobacco Use  . Smoking status: Never Smoker  . Smokeless tobacco: Never Used  Substance and Sexual Activity  . Alcohol use: No    Alcohol/week: 0.0 standard drinks  . Drug use: No  . Sexual activity: Not on file  Lifestyle  . Physical activity:     Days per week: Not on file    Minutes per session: Not on file  . Stress: Not on file  Relationships  . Social connections:    Talks on phone: Not on file    Gets together: Not on file    Attends religious service: Not on file    Active member of club or organization: Not on file    Attends meetings of clubs or organizations: Not on file    Relationship status: Not on file  Other Topics Concern  . Not on file  Social History Narrative   Patient does not get regular exercise   Denies caffeine use    Past Surgical History:  Procedure Laterality Date  . ABDOMINAL HYSTERECTOMY    . Arm Surgery Bilateral    due to fall-pt broke both forearms  . BIOPSY N/A 07/01/2013   Procedure: BIOPSY;  Surgeon: Rogene Houston, MD;  Location: [facility];  Service: Endoscopy;  Laterality: N/A;  . CHOLECYSTECTOMY    . COLONOSCOPY  08/06/2011   Procedure: COLONOSCOPY;  Surgeon: Rogene Houston, MD;  Location[facility];  Service: Endoscopy;  Laterality: N/A;  215  . COLONOSCOPY WITH PROPOFOL N/A 12/31/2012   Procedure: COLONOSCOPY WITH PROPOFOL;  Surgeon: Rogene Houston, MD;  Location: [facility];  Service: Endoscopy;  Laterality: N/A;  in cecum at 0814, total withdrawal time 39min  . CORONARY ANGIOPLASTY WITH STENT PLACEMENT    . ESOPHAGEAL DILATION N/A 06/07/2015   Procedure: ESOPHAGEAL DILATION;  Surgeon: Rogene Houston, MD;  [facility];  Service: Endoscopy;  Laterality: N/A;  . ESOPHAGOGASTRODUODENOSCOPY N/A 06/07/2015   Procedure: ESOPHAGOGASTRODUODENOSCOPY (EGD);  Surgeon: Rogene Houston, MD;  [facility];  Service: Endoscopy;  Laterality: N/A;  2:45 - moved to 1:00 - Ann to notify pt  . ESOPHAGOGASTRODUODENOSCOPY (EGD) WITH PROPOFOL N/A 12/31/2012   Procedure: ESOPHAGOGASTRODUODENOSCOPY (EGD) WITH PROPOFOL;  Surgeon: Rogene Houston, MD;  [facility]: [facility];  Service: Endoscopy;  Laterality: N/A;  GE junction 37,  . ESOPHAGOGASTRODUODENOSCOPY (EGD) WITH PROPOFOL N/A 07/01/2013    Procedure: ESOPHAGOGASTRODUODENOSCOPY (EGD) WITH PROPOFOL;  Surgeon: Rogene Houston, MD;  [facility]: [facility];  Service: Endoscopy;  Laterality: N/A;  950  . MALONEY DILATION N/A 12/31/2012   Procedure: MALONEY DILATION;  Surgeon: Rogene Houston, MD;  Location: [facility];  Service: Endoscopy;  Laterality: N/A;  used a # 54,#56  . MALONEY DILATION N/A 07/01/2013   Procedure: Venia Minks DILATION;  Surgeon: Rogene Houston, MD;  Location: [facility];  Service: Endoscopy;  Laterality: N/A;  54/58; no heme present   Past Medical History:  Diagnosis Date  . Anxiety and depression   . Arthritis   . CAD (coronary artery disease)    Stent placement circumflex  coronary 2007, catheterization 2008 patent stents. Normal LV function  . Chest pain   . CHF (congestive heart failure) ([place])   . COPD (chronic obstructive pulmonary disease) (H[facility])    no home O2  . Depression   . Diabetes mellitus    Insulin dependent  . Diabetic polyneuropathy (HCC)     severe on multiple medications  . Dyslipidemia   . GERD (gastroesophageal reflux disease)   . Headache(784.0)   . 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 Hypertension   . MI (myocardial infarction) ([place])    2007  . Obstructive sleep apnea    CPAP, setting ?2  . Palpitations   . Tachycardia    BP (!) 148/72 (BP Location: Right Arm)   Pulse 79   Resp 14   SpO2 97%   Opioid Risk Score:   Fall Risk Score:  `1  Depression screen PHQ 2/9  Depression screen [facility] 2/9 06/12/2017 04/14/2017 02/17/2017 01/14/2017 12/24/2016 11/11/2016 10/28/2016  Decreased Interest 3 3 0 3 0 0 2  Down, Depressed, Hopeless 3 3 0 3 0 0 -  PHQ - 2 Score 6 6 0 6 0 0 2  Altered sleeping 3 - - - - - -  Tired, decreased energy 3 - - - - - -  Change in appetite 0 - - - - - -  Feeling bad or failure about yourself  3 - - - - - -  Trouble concentrating 3 - - - - - -  Moving slowly or fidgety/restless 1 - - - - - -  Suicidal thoughts 0 - - - - - -  PHQ-9 Score 19 - - - - - -  Difficult doing work/chores  Very difficult - - - - - -  Some recent data might be hidden     Review of Systems  Constitutional: Positive for appetite change.  HENT: Negative.   Eyes: Negative.   Respiratory: Positive for apnea and wheezing.   Gastrointestinal: Positive for abdominal pain, diarrhea, nausea and vomiting.  Endocrine: Negative.   Genitourinary: Negative.   Musculoskeletal: Positive for gait problem.  Skin: Negative.   Allergic/Immunologic: Negative.   Neurological: Positive for weakness and numbness.       Tingling  Psychiatric/Behavioral: Positive for confusion and dysphoric mood. The patient is nervous/anxious.   All other systems reviewed and are negative.      Objective:   Physical Exam  Constitutional: She is oriented to person, place, and time. She appears well-developed and well-nourished.  HENT:  Head: Normocephalic and atraumatic.  Neck: Normal range of motion. Neck supple.  Cardiovascular: Normal rate and normal heart sounds.  Pulmonary/Chest: Effort normal and breath sounds normal.  Musculoskeletal:  Normal Muscle Bulk and Muscle Testing Reveals: Upper Extremities: Full ROM and Muscle Strength 5/5 Thoracic and Lumbar Hypersensitivity Sacral Tenderness Bilateral Greater Trochanter Tenderness Lower Extremities: Full ROM and Muscle Strength 5/5 Bilateral Lower Extremities Flexion Produces Pain into  Lumbar, Bilateral Hips and Bilateral Lower Extremities Transferred to EMS [place]   Neurological: She is alert and oriented to person, place, and time.  Skin: Skin is warm and dry.  Psychiatric: She has a normal mood and affect. Her behavior is normal.  Nursing note and vitals reviewed.         Assessment & Plan:  1.Lumbar pain lumbar spondylosis: Encouraged to continue toincrease Activity as tolerated. 12/07/2017 Continue current medication regimen.Refilled: MS Contin 30 MG one tablet every 8 hours #90. We will continue the opioid monitoring program, this  consists of  regular clinic visits, examinations, urine drug screen, pill counts as well as use of [place] Controlled Substance Reporting System. 2. Lumbar Radiculitis: Continuecurrent medication regimen withGabapentin.12/07/2017 3. Bilateral Knee Pain/ Degenerative: Continue Voltaren Gel. Continue to Monitor. 12/07/2017. 4. Severe diabetic poly neuropathy: Continuecurrent medication regimen withGabapentin. 12/07/2017 5. Insomnia: ContinueTrazodone PsychiatryFollowing. 12/07/2017 6. Anxiety/ Depression:Lorazepam,ProzacandHydroxyzine Prescribed by Psychiatry.0923/2019 7. De Quervains Tenosynovitis: No Complaints Today. 12/07/2017 8. Bilateral Greater Trochanteric Bursitis:Continue with Ice and Heat Therapy. Continue to Monitor. 12/07/2017. 9. Nausea and Vomiting: EMS Called and Transferred to [facility].  10. Frequent Falls. Educated on [employer]. She verbalizes understanding. Transferred to ED for Evaluation.   40 minutes of face to face patient care time was spent during this visit. All questions were encouraged and answered.  F/U in 1 month

## 2017-12-07 NOTE — Discharge Instructions (Addendum)
You were evaluated in the Emergency Department and after careful evaluation, we did not find any emergent condition requiring admission or further testing in the hospital.  Please follow-up with your primary care provider as well as your pain management doctor to discuss the need for change to her medication regimen and/or addition of physical therapy to improve strength.  Please return to the Emergency Department if you experience any worsening of your condition.  We encourage you to follow up with a primary care provider.  Thank you for allowing [place] to be a part of your care.

## 2017-12-08 ENCOUNTER — Telehealth: Payer: Self-pay

## 2017-12-08 MED ORDER — MORPHINE SULFATE ER 30 MG PO TBCR: 30.0000 mg | EXTENDED_RELEASE_TABLET | Freq: Three times a day (TID) | ORAL | 0 refills | Status: DC

## 2017-12-08 NOTE — Telephone Encounter (Signed)
Return [name] call, she also reports she has vomiting  again today, she was instructed to call her PCP and endocrinologist, she verbalizes understanding. Her appointment was rescheduled due to conflict with another appointment. Her appointment is scheduled for 01/12/2018, she was instructed to arrive at 1:00 pm, she verbalizes understanding.

## 2017-12-08 NOTE — Telephone Encounter (Signed)
Pt called stating that she was suppose to call with update on ED visit. She states she was not admitted, she is at home.

## 2017-12-22 ENCOUNTER — Other Ambulatory Visit: Payer: Self-pay

## 2017-12-22 MED ORDER — INSULIN GLARGINE 100 UNIT/ML SOLOSTAR PEN: 70.0000 [IU] | PEN_INJECTOR | Freq: Every day | SUBCUTANEOUS | 2 refills | Status: DC

## 2017-12-23 ENCOUNTER — Encounter (INDEPENDENT_AMBULATORY_CARE_PROVIDER_SITE_OTHER): Payer: Self-pay | Admitting: Internal Medicine

## 2017-12-23 ENCOUNTER — Ambulatory Visit (INDEPENDENT_AMBULATORY_CARE_PROVIDER_SITE_OTHER): Payer: Medicaid Other | Admitting: Internal Medicine

## 2017-12-23 ENCOUNTER — Encounter (INDEPENDENT_AMBULATORY_CARE_PROVIDER_SITE_OTHER): Payer: Self-pay | Admitting: *Deleted

## 2017-12-23 VITALS — BP 162/90 | HR 68 | Temp 98.4°F | Ht 62.0 in | Wt 221.6 lb

## 2017-12-23 DIAGNOSIS — K3184 Gastroparesis: Secondary | ICD-10-CM

## 2017-12-23 DIAGNOSIS — IMO0002 Reserved for concepts with insufficient information to code with codable children: Secondary | ICD-10-CM

## 2017-12-23 DIAGNOSIS — E1051 Type 1 diabetes mellitus with diabetic peripheral angiopathy without gangrene: Secondary | ICD-10-CM | POA: Diagnosis not present

## 2017-12-23 DIAGNOSIS — E1065 Type 1 diabetes mellitus with hyperglycemia: Secondary | ICD-10-CM

## 2017-12-23 DIAGNOSIS — R112 Nausea with vomiting, unspecified: Secondary | ICD-10-CM

## 2017-12-23 NOTE — Progress Notes (Signed)
Subjective:    Patient ID: [name], female    DOB: 12-22-1950, 67 y.o.   MRN: [id number]  HPI Presents today with c/o nausea and vomiting about every other day. She says she cannot figure out what is going on.  Symptoms for a couple of months. The nausea occurs in the morning. Symptoms may last for about 30 minutes. Her appetite is not good. Normal weight 230. Today her weight is 221.6.  She says her stomach is noisy and feels like she is hungry. She is eating 3 meals a day.  She eats a snack during the day. BMs are normal.  No melena or BRRB.  She does have nausea after she eats. Has an Rx for Reglan but has not started.  Taking Zofran for the nausea.  She is in a w/c. She has pain in her legs and also has frequent falls. She as neuropathy in her lower extremities.   Hx of diabetes for 30-40 yrs.   States her blood sugar run good.   Her last EGD was in  March of 2017 which revealed: Impression:               - Normal gastroesophageal junction and esophagus.                           - Z-line regular, 38 cm from the incisors.                           - 2 cm hiatal hernia.                           - Portal hypertensive gastropathy.                           - A few gastric polyps. endoscopic appearance                            suggestive of hyperplastic polyps.                           - Normal duodenal bulb.                           - Scalloped mucosa was found in the duodenum.                           - No endoscopic esophageal abnormality to explain                            patient's dysphagia. Esophagus dilated. Dilated.                           - No specimens collected.  Hx of Coronary angioplasty with stent.  Review of Systems Past Medical History:  Diagnosis Date  . Anxiety and depression   . Arthritis   . CAD (coronary artery disease)    Stent placement circumflex coronary 2007, catheterization 2008 patent stents. Normal LV function  . Chest pain   .  CHF (congestive heart failure) ([place])   . COPD (chronic obstructive pulmonary disease) ([place])  no home O2  . Depression   . Diabetes mellitus    Insulin dependent  . Diabetic polyneuropathy (H[facility])     severe on multiple medications  . Dyslipidemia   . GERD (gastroesophageal reflux disease)   . Headache(784.0)   . 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 Hypertension   . MI (myocardial infarction) ([place])    2007  . Obstructive sleep apnea    CPAP, setting ?2  . Palpitations   . Tachycardia     Past Surgical History:  Procedure Laterality Date  . ABDOMINAL HYSTERECTOMY    . Arm Surgery Bilateral    due to fall-pt broke both forearms  . BIOPSY N/A 07/01/2013   Procedure: BIOPSY;  Surgeon: Rogene Houston, MD;  Location: [facility];  Service: Endoscopy;  Laterality: N/A;  . CHOLECYSTECTOMY    . COLONOSCOPY  08/06/2011   Procedure: COLONOSCOPY;  Surgeon: Rogene Houston, MD;  [facility];  Service: Endoscopy;  Laterality: N/A;  215  . COLONOSCOPY WITH PROPOFOL N/A 12/31/2012   Procedure: COLONOSCOPY WITH PROPOFOL;  Surgeon: Rogene Houston, MD;  Location: [facility];  Service: Endoscopy;  Laterality: N/A;  in cecum at 0814, total withdrawal time 66min  . CORONARY ANGIOPLASTY WITH STENT PLACEMENT    . ESOPHAGEAL DILATION N/A 06/07/2015   Procedure: ESOPHAGEAL DILATION;  Surgeon: Rogene Houston, MD;  [facility];  Service: Endoscopy;  Laterality: N/A;  . ESOPHAGOGASTRODUODENOSCOPY N/A 06/07/2015   Procedure: ESOPHAGOGASTRODUODENOSCOPY (EGD);  Surgeon: Rogene Houston, MD;  [facility];  Service: Endoscopy;  Laterality: N/A;  2:45 - moved to 1:00 - Ann to notify pt  . ESOPHAGOGASTRODUODENOSCOPY (EGD) WITH PROPOFOL N/A 12/31/2012   Procedure: ESOPHAGOGASTRODUODENOSCOPY (EGD) WITH PROPOFOL;  Surgeon: Rogene Houston, MD;  Location: [facility];  Service: Endoscopy;  Laterality: N/A;  GE junction 37,  . ESOPHAGOGASTRODUODENOSCOPY (EGD) WITH PROPOFOL N/A 07/01/2013   Procedure:  ESOPHAGOGASTRODUODENOSCOPY (EGD) WITH PROPOFOL;  Surgeon: Rogene Houston, MD;  Location: [facility];  Service: Endoscopy;  Laterality: N/A;  950  . MALONEY DILATION N/A 12/31/2012   Procedure: MALONEY DILATION;  Surgeon: Rogene Houston, MD;  Location: [facility];  Service: Endoscopy;  Laterality: N/A;  used a # 54,#56  . MALONEY DILATION N/A 07/01/2013   Procedure: Venia Minks DILATION;  Surgeon: Rogene Houston, MD;  Location: [facility];  Service: Endoscopy;  Laterality: N/A;  54/58; no heme present    Allergies  Allergen Reactions  . Nitrofuran Derivatives Itching and Swelling  . Amphetamine-Dextroamphet Er Swelling  . Pregabalin Swelling  . Topiramate Other (See Comments)    Tongue tingle  . Verelan [Verapamil] Rash    Current Outpatient Medications on File Prior to Visit  Medication Sig Dispense Refill  . ACCU-CHEK AVIVA PLUS test strip TEST THREE TIMES DAILY DX: E11.9 150 each 5  . aspirin EC 81 MG tablet Take 81 mg by mouth daily.    Marland Kitchen atorvastatin (LIPITOR) 40 MG tablet Take 40 mg by mouth daily.    Marland Kitchen buPROPion (WELLBUTRIN XL) 150 MG 24 hr tablet Take 150 mg by mouth daily.    . diclofenac sodium (VOLTAREN) 1 % GEL apply TO BOTH knees THREE TIMES DAILY 300 g 2  . estrogens, conjugated, (PREMARIN) 1.25 MG tablet Take 1.25 mg by mouth daily.     Marland Kitchen FLUoxetine (PROZAC) 20 MG capsule Take 20 mg by mouth daily.  1  . furosemide (LASIX) 20 MG tablet Take 20 mg by mouth daily as  needed.  1  . gabapentin (NEURONTIN) 400 MG capsule Take 400 mg by mouth 3 (three) times daily.  5  . GLOBAL EASE INJECT PEN NEEDLES 31G X 8 MM MISC USE AS DIRECTED FOUR TIMES DAILY 100 each 3  . hydrOXYzine (ATARAX/VISTARIL) 10 MG tablet Take 10 mg by mouth 4 (four) times daily as needed for anxiety.    . Insulin Glargine (LANTUS SOLOSTAR) 100 UNIT/ML Solostar Pen Inject 70 Units into the skin at bedtime. 9 mL 2  . lidocaine (XYLOCAINE) 5 % ointment Apply 1 application topically at bedtime.     Marland Kitchen LORazepam (ATIVAN) 0.5 MG  tablet Take 0.5 mg by mouth 2 (two) times daily as needed. for anxiety  1  . meloxicam (MOBIC) 7.5 MG tablet Take 1 tablet (7.5 mg total) by mouth daily.  4  . metFORMIN (GLUCOPHAGE) 500 MG tablet TAKE 1 TABLET BY MOUTH TWICE DAILY with a meal (Patient taking differently: Take 500 mg by mouth 2 (two) times daily with a meal. ) 60 tablet 2  . methocarbamol (ROBAXIN) 500 MG tablet Take 1 tablet (500 mg total) by mouth 3 (three) times daily. 45 tablet 0  . metoCLOPramide (REGLAN) 5 MG tablet Take 5 mg by mouth 3 (three) times daily.  1  . metoprolol succinate (TOPROL-XL) 50 MG 24 hr tablet TAKE 1 AND 1/2 TABLETS BY MOUTH EVERY DAY 135 tablet 1  . montelukast (SINGULAIR) 10 MG tablet Take 10 mg by mouth daily.  5  . morphine (MS CONTIN) 30 MG 12 hr tablet Take 1 tablet (30 mg total) by mouth every 8 (eight) hours. 90 tablet 0  . Multiple Vitamin (MULTIVITAMIN WITH MINERALS) TABS tablet Take 1 tablet by mouth daily.    . nitroGLYCERIN (NITROSTAT) 0.4 MG SL tablet Place 1 tablet (0.4 mg total) under the tongue every 5 (five) minutes x 3 doses as needed. For chest pains 25 tablet 3  . ondansetron (ZOFRAN) 4 MG tablet Take 4 mg by mouth every 6 (six) hours as needed for nausea or vomiting.    . pantoprazole (PROTONIX) 40 MG tablet TAKE ONE TABLET BY MOUTH TWICE DAILY BEFORE MEALS (Patient taking differently: Take 40 mg by mouth 2 (two) times daily. ) 60 tablet 5  . potassium chloride (K-DUR,KLOR-CON) 10 MEQ tablet Take 10 mEq by mouth 2 (two) times daily.  12  . PROAIR HFA 108 (90 Base) MCG/ACT inhaler Inhale 2 puffs into the lungs every 4 (four) hours as needed for wheezing or shortness of breath.   8  . psyllium (METAMUCIL SMOOTH TEXTURE) 28 % packet Take 1 packet by mouth at bedtime.    . ranitidine (ZANTAC) 300 MG tablet Take 1 tablet (300 mg total) by mouth at bedtime as needed for heartburn.  3  . topiramate (TOPAMAX) 200 MG tablet Take 200 mg by mouth at bedtime.  3  . traZODone (DESYREL) 150 MG  tablet Take 150 mg by mouth at bedtime.    Marland Kitchen zolpidem (AMBIEN) 10 MG tablet Take 10 mg by mouth at bedtime.  1  . [DISCONTINUED] diltiazem (TIAZAC) 180 MG 24 hr capsule Take 1 capsule (180 mg total) by mouth daily. 30 capsule 1  . [DISCONTINUED] potassium chloride (K-DUR) 10 MEQ tablet Take 2 tablets (20 mEq total) by mouth 2 (two) times daily.     No current facility-administered medications on file prior to visit.         Objective:   Physical Exam Blood pressure (!) 162/90, pulse 68, temperature  98.4 F (36.9 C), height 5\' 2"  (1.575 m), weight 221 lb 9.6 oz (100.5 kg). Alert and oriented. Skin warm and dry. Oral mucosa is moist.   . Sclera anicteric, conjunctivae is pink. Thyroid not enlarged. No cervical lymphadenopathy. Lungs clear. Heart regular rate and rhythm.  Abdomen is soft. Bowel sounds are positive. No hepatomegaly. No abdominal masses felt. No tenderness.  No edema to lower extremities.          Assessment & Plan:  Nausea and vomiting with hx of diabetes. Am going going to get an Emptying study.  Will hold Reglan till after Emptying study.

## 2017-12-23 NOTE — Patient Instructions (Signed)
Emptying study.

## 2017-12-29 ENCOUNTER — Other Ambulatory Visit (HOSPITAL_COMMUNITY): Payer: Medicaid Other

## 2017-12-30 ENCOUNTER — Encounter (HOSPITAL_COMMUNITY): Payer: Medicaid Other

## 2018-01-06 ENCOUNTER — Telehealth (INDEPENDENT_AMBULATORY_CARE_PROVIDER_SITE_OTHER): Payer: Self-pay | Admitting: *Deleted

## 2018-01-06 ENCOUNTER — Encounter (INDEPENDENT_AMBULATORY_CARE_PROVIDER_SITE_OTHER): Payer: Self-pay | Admitting: *Deleted

## 2018-01-06 ENCOUNTER — Other Ambulatory Visit (INDEPENDENT_AMBULATORY_CARE_PROVIDER_SITE_OTHER): Payer: Self-pay | Admitting: *Deleted

## 2018-01-06 DIAGNOSIS — Z800 Family history of malignant neoplasm of digestive organs: Secondary | ICD-10-CM

## 2018-01-06 DIAGNOSIS — Z860100 Personal history of colon polyps, unspecified: Secondary | ICD-10-CM | POA: Insufficient documentation

## 2018-01-06 MED ORDER — SUPREP BOWEL PREP KIT 17.5-3.13-1.6 GM/177ML PO SOLN: 1.0000 | Freq: Once | ORAL | 0 refills | Status: AC

## 2018-01-06 NOTE — Telephone Encounter (Signed)
Patient need suprep ?

## 2018-01-06 NOTE — Telephone Encounter (Signed)
agree

## 2018-01-06 NOTE — Telephone Encounter (Signed)
Referring MD/PCP: vyas   Procedure: tcs w/ propofol  Reason/Indication:  Hx polyps, fam hx colon ca  Has patient had this procedure before?  Yes, 2014  If so, when, by whom and where?    Is there a family history of colon cancer?  Yes, brother & Mother  Who?  What age when diagnosed?    Is patient diabetic?   yes      Does patient have prosthetic heart valve or mechanical valve?  no  Do you have a pacemaker?  no  Has patient ever had endocarditis? no  Has patient had joint replacement within last 12 months?  no  Is patient constipated or do they take laxatives? no  Does patient have a history of alcohol/drug use?  no  Is patient on blood thinner such as Coumadin, Plavix and/or Aspirin? yes  Medications: see epic  Allergies: see epic  Medication Adjustment per Dr Lindi Adie, NP: asa 2 days, half lantus evening before, hold metfromin evening before & morning of  Procedure date & time: 02/05/18 at 125  preop 11/15 at 145

## 2018-01-07 ENCOUNTER — Ambulatory Visit: Payer: Medicaid Other | Admitting: "Endocrinology

## 2018-01-07 ENCOUNTER — Ambulatory Visit: Payer: Medicaid Other | Admitting: Registered Nurse

## 2018-01-08 ENCOUNTER — Encounter: Payer: Self-pay | Admitting: "Endocrinology

## 2018-01-10 ENCOUNTER — Other Ambulatory Visit (INDEPENDENT_AMBULATORY_CARE_PROVIDER_SITE_OTHER): Payer: Self-pay | Admitting: Internal Medicine

## 2018-01-10 ENCOUNTER — Other Ambulatory Visit: Payer: Self-pay | Admitting: Registered Nurse

## 2018-01-12 ENCOUNTER — Encounter: Payer: Medicaid Other | Attending: Physical Medicine & Rehabilitation

## 2018-01-12 ENCOUNTER — Ambulatory Visit: Payer: Medicaid Other | Admitting: Registered Nurse

## 2018-01-12 ENCOUNTER — Encounter: Payer: Self-pay | Admitting: Physical Medicine & Rehabilitation

## 2018-01-12 ENCOUNTER — Ambulatory Visit: Payer: Medicaid Other | Admitting: Physical Medicine & Rehabilitation

## 2018-01-12 VITALS — BP 183/77 | HR 70 | Ht 62.0 in | Wt 210.0 lb

## 2018-01-12 DIAGNOSIS — J449 Chronic obstructive pulmonary disease, unspecified: Secondary | ICD-10-CM | POA: Insufficient documentation

## 2018-01-12 DIAGNOSIS — M47896 Other spondylosis, lumbar region: Secondary | ICD-10-CM | POA: Insufficient documentation

## 2018-01-12 DIAGNOSIS — I509 Heart failure, unspecified: Secondary | ICD-10-CM | POA: Diagnosis not present

## 2018-01-12 DIAGNOSIS — F418 Other specified anxiety disorders: Secondary | ICD-10-CM | POA: Insufficient documentation

## 2018-01-12 DIAGNOSIS — R079 Chest pain, unspecified: Secondary | ICD-10-CM | POA: Insufficient documentation

## 2018-01-12 DIAGNOSIS — M4316 Spondylolisthesis, lumbar region: Secondary | ICD-10-CM | POA: Diagnosis not present

## 2018-01-12 DIAGNOSIS — R000 Tachycardia, unspecified: Secondary | ICD-10-CM | POA: Diagnosis not present

## 2018-01-12 DIAGNOSIS — R51 Headache: Secondary | ICD-10-CM | POA: Diagnosis not present

## 2018-01-12 DIAGNOSIS — E1142 Type 2 diabetes mellitus with diabetic polyneuropathy: Secondary | ICD-10-CM | POA: Insufficient documentation

## 2018-01-12 DIAGNOSIS — Z760 Encounter for issue of repeat prescription: Secondary | ICD-10-CM | POA: Insufficient documentation

## 2018-01-12 DIAGNOSIS — Z1401 Asymptomatic hemophilia A carrier: Secondary | ICD-10-CM | POA: Diagnosis not present

## 2018-01-12 DIAGNOSIS — Z5181 Encounter for therapeutic drug level monitoring: Secondary | ICD-10-CM | POA: Diagnosis not present

## 2018-01-12 DIAGNOSIS — M48061 Spinal stenosis, lumbar region without neurogenic claudication: Secondary | ICD-10-CM | POA: Diagnosis not present

## 2018-01-12 DIAGNOSIS — R002 Palpitations: Secondary | ICD-10-CM | POA: Insufficient documentation

## 2018-01-12 DIAGNOSIS — Z794 Long term (current) use of insulin: Secondary | ICD-10-CM | POA: Insufficient documentation

## 2018-01-12 DIAGNOSIS — G4733 Obstructive sleep apnea (adult) (pediatric): Secondary | ICD-10-CM | POA: Insufficient documentation

## 2018-01-12 DIAGNOSIS — G8929 Other chronic pain: Secondary | ICD-10-CM | POA: Insufficient documentation

## 2018-01-12 DIAGNOSIS — M1990 Unspecified osteoarthritis, unspecified site: Secondary | ICD-10-CM | POA: Diagnosis not present

## 2018-01-12 DIAGNOSIS — E785 Hyperlipidemia, unspecified: Secondary | ICD-10-CM | POA: Diagnosis not present

## 2018-01-12 DIAGNOSIS — M79605 Pain in left leg: Secondary | ICD-10-CM | POA: Insufficient documentation

## 2018-01-12 DIAGNOSIS — M5489 Other dorsalgia: Secondary | ICD-10-CM | POA: Diagnosis not present

## 2018-01-12 DIAGNOSIS — M79604 Pain in right leg: Secondary | ICD-10-CM | POA: Diagnosis not present

## 2018-01-12 DIAGNOSIS — I252 Old myocardial infarction: Secondary | ICD-10-CM | POA: Insufficient documentation

## 2018-01-12 DIAGNOSIS — I2510 Atherosclerotic heart disease of native coronary artery without angina pectoris: Secondary | ICD-10-CM | POA: Diagnosis not present

## 2018-01-12 DIAGNOSIS — K219 Gastro-esophageal reflux disease without esophagitis: Secondary | ICD-10-CM | POA: Diagnosis not present

## 2018-01-12 DIAGNOSIS — Z79899 Other long term (current) drug therapy: Secondary | ICD-10-CM

## 2018-01-12 DIAGNOSIS — G4700 Insomnia, unspecified: Secondary | ICD-10-CM | POA: Insufficient documentation

## 2018-01-12 MED ORDER — MORPHINE SULFATE ER 30 MG PO TBCR: 30.0000 mg | EXTENDED_RELEASE_TABLET | Freq: Three times a day (TID) | ORAL | 0 refills | Status: DC

## 2018-01-12 NOTE — Progress Notes (Signed)
Subjective:    Patient ID: [name], female    DOB: 28-Oct-1950, 67 y.o.   MRN: [id number]  HPI Scheduled for upper and lower endoscopy as well as gastric emptying study with Dr Laural Golden Left leg giving out   LLE weak, giving out, now in a WC x 2 mo Had a fall in June with head trauma, seen in ED, CT and MRI brain neg  The patient notes no increase in low back pain.  She has no increase in numbness or tingling.  She does have chronic numbness and tingling in the hands and feet due to her diabetes.  Bladder does not empty , some burning with urination, pt has not contacted PCP but said she would Pain Inventory Average Pain 7 Pain Right Now 7 My pain is sharp, burning, dull, stabbing, tingling and aching  In the last 24 hours, has pain interfered with the following? General activity 7 Relation with others 3 Enjoyment of life 6 What TIME of day is your pain at its worst? night Sleep (in general) Fair  Pain is worse with: walking, bending and standing Pain improves with: heat/ice and medication Relief from Meds: 5  Mobility walk with assistance ability to climb steps?  no do you drive?  no  Function not employed: date last employed na I need assistance with the following:  dressing, bathing, meal prep, household duties and shopping  Neuro/Psych bladder control problems weakness numbness tingling trouble walking dizziness depression anxiety loss of taste or smell  Prior Studies Any changes since last visit?  no  Physicians involved in your care Any changes since last visit?  no   Family History  Problem Relation Age of Onset  . Heart attack Mother   . Colon cancer Mother   . Heart attack Father   . Stroke Sister   . Hemophilia Child   . Cancer Unknown   . Coronary artery disease Unknown   . Cancer Brother   . Cancer Maternal Aunt   . Cancer Maternal Uncle   . Cancer Paternal Aunt   . Cancer Paternal Uncle   . Cancer Brother    Social History     Socioeconomic History  . Marital status: Legally Separated    Spouse name: Not on file  . Number of children: 4  . Years of education: Not on file  . Highest education level: Not on file  Occupational History  . Occupation: Disabled  Social Needs  . Financial resource strain: Not on file  . Food insecurity:    Worry: Not on file    Inability: Not on file  . Transportation needs:    Medical: Not on file    Non-medical: Not on file  Tobacco Use  . Smoking status: Never Smoker  . Smokeless tobacco: Never Used  Substance and Sexual Activity  . Alcohol use: No    Alcohol/week: 0.0 standard drinks  . Drug use: No  . Sexual activity: Not on file  Lifestyle  . Physical activity:    Days per week: Not on file    Minutes per session: Not on file  . Stress: Not on file  Relationships  . Social connections:    Talks on phone: Not on file    Gets together: Not on file    Attends religious service: Not on file    Active member of club or organization: Not on file    Attends meetings of clubs or organizations: Not on file  Relationship status: Not on file  Other Topics Concern  . Not on file  Social History Narrative   Patient does not get regular exercise   Denies caffeine use    Past Surgical History:  Procedure Laterality Date  . ABDOMINAL HYSTERECTOMY    . Arm Surgery Bilateral    due to fall-pt broke both forearms  . BIOPSY N/A 07/01/2013   Procedure: BIOPSY;  Surgeon: Rogene Houston, MD;  Location: [facility];  Service: Endoscopy;  Laterality: N/A;  . CHOLECYSTECTOMY    . COLONOSCOPY  08/06/2011   Procedure: COLONOSCOPY;  Surgeon: Rogene Houston, MD;  [facility];  Service: Endoscopy;  Laterality: N/A;  215  . COLONOSCOPY WITH PROPOFOL N/A 12/31/2012   Procedure: COLONOSCOPY WITH PROPOFOL;  Surgeon: Rogene Houston, MD;  Location: [facility];  Service: Endoscopy;  Laterality: N/A;  in cecum at 0814, total withdrawal time 52mn  . CORONARY ANGIOPLASTY WITH STENT  PLACEMENT    . ESOPHAGEAL DILATION N/A 06/07/2015   Procedure: ESOPHAGEAL DILATION;  Surgeon: NRogene Houston MD;  [facility];  Service: Endoscopy;  Laterality: N/A;  . ESOPHAGOGASTRODUODENOSCOPY N/A 06/07/2015   Procedure: ESOPHAGOGASTRODUODENOSCOPY (EGD);  Surgeon: NRogene Houston MD;  [facility];  Service: Endoscopy;  Laterality: N/A;  2:45 - moved to 1:00 - Ann to notify pt  . ESOPHAGOGASTRODUODENOSCOPY (EGD) WITH PROPOFOL N/A 12/31/2012   Procedure: ESOPHAGOGASTRODUODENOSCOPY (EGD) WITH PROPOFOL;  Surgeon: NRogene Houston MD;  [facility]: [facility];  Service: Endoscopy;  Laterality: N/A;  GE junction 37,  . ESOPHAGOGASTRODUODENOSCOPY (EGD) WITH PROPOFOL N/A 07/01/2013   Procedure: ESOPHAGOGASTRODUODENOSCOPY (EGD) WITH PROPOFOL;  Surgeon: NRogene Houston MD;  [facility];  Service: Endoscopy;  Laterality: N/A;  950  . MALONEY DILATION N/A 12/31/2012   Procedure: MALONEY DILATION;  Surgeon: NRogene Houston MD;  [facility]: [facility];  Service: Endoscopy;  Laterality: N/A;  used a # 54,#56  . MALONEY DILATION N/A 07/01/2013   Procedure: MVenia MinksDILATION;  Surgeon: NRogene Houston MD;  Location: [facility];  Service: Endoscopy;  Laterality: N/A;  54/58; no heme present   Past Medical History:  Diagnosis Date  . Anxiety and depression   . Arthritis   . CAD (coronary artery disease)    Stent placement circumflex coronary 2007, catheterization 2008 patent stents. Normal LV function  . Chest pain   . CHF (congestive heart failure) ([facility]   . COPD (chronic obstructive pulmonary disease) ([facility])    no home O2  . Depression   . Diabetes mellitus    Insulin dependent  . Diabetic polyneuropathy (HCC)     severe on multiple medications  . Dyslipidemia   . GERD (gastroesophageal reflux disease)   . Headache(784.0)   . 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 Hypertension   . MI [place] infarction) [place] [place]    2007  . Obstructive sleep apnea    CPAP, setting ?2  . Palpitations   .  Tachycardia    There were no vitals taken for this visit.  Opioid Risk Score:   Fall Risk Score:  `1  Depression screen PHQ 2/9  [facility] 06/12/2017 04/14/2017 02/17/2017 01/14/2017 12/24/2016 11/11/2016 10/28/2016  Decreased Interest 3 3 0 3 0 0 2  Down, Depressed, Hopeless 3 3 0 3 0 0 -  PHQ - 2 Score 6 6 0 6 0 0 2  Altered sleeping 3 - - - - - -  Tired, decreased energy 3 - - - - - -  Change in appetite 0 - - - - - -  Feeling bad or failure about yourself  3 - - - - - -  Trouble concentrating 3 - - - - - -  Moving slowly or fidgety/restless 1 - - - - - -  Suicidal thoughts 0 - - - - - -  PHQ-9 Score 19 - - - - - -  Difficult doing work/chores Very difficult - - - - - -  Some recent data might be hidden     Review of Systems  Constitutional: Positive for appetite change.  HENT: Negative.   Eyes: Negative.   Respiratory: Positive for apnea and shortness of breath.   Cardiovascular: Negative.   Gastrointestinal: Positive for nausea and vomiting.  Endocrine: Negative.   Genitourinary: Negative.   Musculoskeletal: Positive for arthralgias, back pain, gait problem and myalgias.  Skin: Negative.   Allergic/Immunologic: Negative.   Neurological: Positive for dizziness, weakness and numbness.  Hematological: Negative.   Psychiatric/Behavioral: Positive for dysphoric mood. The patient is nervous/anxious.   All other systems reviewed and are negative.      Objective:   Physical Exam  Constitutional: She is oriented to person, place, and time. She appears well-developed and well-nourished. No distress.  HENT:  Head: Normocephalic and atraumatic.  Eyes: Pupils are equal, round, and reactive to light. EOM are normal.  Neck: Normal range of motion.  Musculoskeletal:  No pain with hip or knee range of motion bilaterally. Negative straight leg raise No evidence of knee swelling or ankle swelling  Neurological: She is alert and oriented to person, place, and time.  She displays no atrophy. A sensory deficit is present. Gait abnormal.  Reflex Scores:      Patellar reflexes are 1+ on the right side and 1+ on the left side.      Achilles reflexes are 0 on the right side and 0 on the left side. Patient with glove and stocking distribution numbness to pinprick involving both hands and both legs up to the knee. Gait was not tested due to complaints of knee buckling.   Skin: Skin is warm and dry. She is not diaphoretic.  Psychiatric: She has a normal mood and affect.          Assessment & Plan:  1.  History of chronic low back pain now with increasing weakness in the lower extremities.  I reviewed her most recent imaging studies This included lumbar spine x-rays 12/07/2017 which showed no evidence of fracture, mild spondylolisthesis L4-5.  There is an MRI of the lumbar spine 10/13/2012 which showed mild stenosis at L4-L5, as well as a CT of the abdomen pelvis 10/02/2017.  The recent CT showed spinal stenosis at L4-5 L5-S1 Given her increased weakness I think it would be helpful to get an updated MRI of the lumbar spine to evaluate degree of stenosis on that imaging and see whether it may explain her lower extremity weakness.  If not, an EMG to rule out diabetic polyneuropathy would be indicated. Patient will follow-up with her primary care in terms of her urinary frequency and dysuria Discussed with patient and her husband  We will continue her MS Contin 30 mg 3 times daily PMP aware website reviewed no red flags UDS last performed 1/65/5374, no illicit drugs, no nonprescribed opiates Physical medicine rehab MD visit in 1 month  In terms of her diabetes with peripheral neuropathy will continue gabapentin 400 mg 3 times daily

## 2018-01-12 NOTE — Progress Notes (Signed)
UDS not obtained, patient not able to stand, HIGH fall risk, dry mouth

## 2018-01-12 NOTE — Patient Instructions (Signed)
Need MRI to eval leg weakness

## 2018-01-13 ENCOUNTER — Encounter (HOSPITAL_COMMUNITY): Payer: Self-pay

## 2018-01-13 ENCOUNTER — Encounter (HOSPITAL_COMMUNITY)
Admission: RE | Admit: 2018-01-13 | Discharge: 2018-01-13 | Disposition: A | Discharge status: Home / Self Care | Payer: Medicaid Other | Source: Ambulatory Visit | Attending: Internal Medicine | Admitting: Internal Medicine

## 2018-01-13 DIAGNOSIS — R112 Nausea with vomiting, unspecified: Secondary | ICD-10-CM | POA: Diagnosis present

## 2018-01-13 MED ORDER — TECHNETIUM TC 99M SULFUR COLLOID FILTERED: 2.0000 | Freq: Once | INTRAVENOUS | Status: DC | PRN

## 2018-01-13 MED ORDER — TECHNETIUM TC 99M SULFUR COLLOID
2.0000 | Freq: Once | INTRAVENOUS | Status: AC | PRN
  Administered 2018-01-13: 2.2 via ORAL

## 2018-01-18 ENCOUNTER — Telehealth (INDEPENDENT_AMBULATORY_CARE_PROVIDER_SITE_OTHER): Payer: Self-pay | Admitting: Internal Medicine

## 2018-01-18 DIAGNOSIS — R112 Nausea with vomiting, unspecified: Secondary | ICD-10-CM

## 2018-01-18 MED ORDER — SUCRALFATE 1 G PO TABS: 1.0000 g | ORAL_TABLET | Freq: Three times a day (TID) | ORAL | 3 refills | Status: DC

## 2018-01-18 NOTE — Telephone Encounter (Signed)
Rx for Carafate called to pharmacy

## 2018-01-21 ENCOUNTER — Telehealth: Payer: Self-pay | Admitting: *Deleted

## 2018-01-21 NOTE — Telephone Encounter (Signed)
Prior authorization submitted to [employer] for morphine sulfate 30 mg ER

## 2018-01-22 ENCOUNTER — Ambulatory Visit (HOSPITAL_COMMUNITY): Payer: Medicaid Other

## 2018-01-28 LAB — HEMOGLOBIN A1C
Hgb A1c MFr Bld: 7.1 % of total Hgb — ABNORMAL HIGH (ref <5.7)
Mean Plasma Glucose: 157 (calc)
eAG (mmol/L): 8.7 (calc)

## 2018-01-28 LAB — COMPLETE METABOLIC PANEL WITH GFR
AG Ratio: 1.3 (calc) (ref 1.0–2.5)
ALT: 7 U/L (ref 6–29)
AST: 8 U/L — ABNORMAL LOW (ref 10–35)
Albumin: 3.2 g/dL — ABNORMAL LOW (ref 3.6–5.1)
Alkaline phosphatase (APISO): 67 U/L (ref 33–130)
BUN: 13 mg/dL (ref 7–25)
CO2: 30 mmol/L (ref 20–32)
Calcium: 9 mg/dL (ref 8.6–10.4)
Chloride: 106 mmol/L (ref 98–110)
Creat: 0.62 mg/dL (ref 0.50–0.99)
GFR, Est African American: 110 mL/min/{1.73_m2} (ref >=60)
GFR, Est Non African American: 95 mL/min/{1.73_m2} (ref >=60)
Globulin: 2.4 g/dL (calc) (ref 1.9–3.7)
Glucose, Bld: 163 mg/dL — ABNORMAL HIGH (ref 65–139)
Potassium: 4.9 mmol/L (ref 3.5–5.3)
Sodium: 141 mmol/L (ref 135–146)
Total Bilirubin: 0.4 mg/dL (ref 0.2–1.2)
Total Protein: 5.6 g/dL — ABNORMAL LOW (ref 6.1–8.1)

## 2018-01-29 ENCOUNTER — Encounter (HOSPITAL_COMMUNITY)
Admission: RE | Admit: 2018-01-29 | Discharge: 2018-01-29 | Disposition: A | Discharge status: Home / Self Care | Payer: Medicaid Other | Source: Ambulatory Visit | Attending: Internal Medicine | Admitting: Internal Medicine

## 2018-01-29 ENCOUNTER — Encounter (HOSPITAL_COMMUNITY): Payer: Self-pay

## 2018-02-01 ENCOUNTER — Encounter: Payer: Medicaid Other | Admitting: Registered Nurse

## 2018-02-03 ENCOUNTER — Ambulatory Visit: Payer: Medicaid Other | Admitting: Registered Nurse

## 2018-02-05 ENCOUNTER — Ambulatory Visit (HOSPITAL_COMMUNITY): Payer: Medicaid Other | Admitting: Anesthesiology

## 2018-02-05 ENCOUNTER — Encounter (HOSPITAL_COMMUNITY)
Admission: RE | Disposition: A | Discharge status: Home / Self Care | Payer: Self-pay | Source: Ambulatory Visit | Attending: Internal Medicine

## 2018-02-05 ENCOUNTER — Ambulatory Visit (HOSPITAL_COMMUNITY)
Admission: RE | Admit: 2018-02-05 | Discharge: 2018-02-05 | Disposition: A | Discharge status: Home / Self Care | Payer: Medicaid Other | Source: Ambulatory Visit | Attending: Internal Medicine | Admitting: Internal Medicine

## 2018-02-05 DIAGNOSIS — I2510 Atherosclerotic heart disease of native coronary artery without angina pectoris: Secondary | ICD-10-CM | POA: Insufficient documentation

## 2018-02-05 DIAGNOSIS — Z79890 Hormone replacement therapy (postmenopausal): Secondary | ICD-10-CM | POA: Diagnosis not present

## 2018-02-05 DIAGNOSIS — K219 Gastro-esophageal reflux disease without esophagitis: Secondary | ICD-10-CM | POA: Insufficient documentation

## 2018-02-05 DIAGNOSIS — Z09 Encounter for follow-up examination after completed treatment for conditions other than malignant neoplasm: Secondary | ICD-10-CM

## 2018-02-05 DIAGNOSIS — K644 Residual hemorrhoidal skin tags: Secondary | ICD-10-CM | POA: Insufficient documentation

## 2018-02-05 DIAGNOSIS — I110 Hypertensive heart disease with heart failure: Secondary | ICD-10-CM | POA: Diagnosis not present

## 2018-02-05 DIAGNOSIS — Z794 Long term (current) use of insulin: Secondary | ICD-10-CM | POA: Diagnosis not present

## 2018-02-05 DIAGNOSIS — F329 Major depressive disorder, single episode, unspecified: Secondary | ICD-10-CM | POA: Insufficient documentation

## 2018-02-05 DIAGNOSIS — Z86010 Personal history of colonic polyps: Secondary | ICD-10-CM | POA: Insufficient documentation

## 2018-02-05 DIAGNOSIS — F419 Anxiety disorder, unspecified: Secondary | ICD-10-CM | POA: Insufficient documentation

## 2018-02-05 DIAGNOSIS — R197 Diarrhea, unspecified: Secondary | ICD-10-CM | POA: Diagnosis present

## 2018-02-05 DIAGNOSIS — Z800 Family history of malignant neoplasm of digestive organs: Secondary | ICD-10-CM | POA: Insufficient documentation

## 2018-02-05 DIAGNOSIS — Z7982 Long term (current) use of aspirin: Secondary | ICD-10-CM | POA: Insufficient documentation

## 2018-02-05 DIAGNOSIS — E785 Hyperlipidemia, unspecified: Secondary | ICD-10-CM | POA: Diagnosis not present

## 2018-02-05 DIAGNOSIS — K6289 Other specified diseases of anus and rectum: Secondary | ICD-10-CM | POA: Insufficient documentation

## 2018-02-05 DIAGNOSIS — K900 Celiac disease: Secondary | ICD-10-CM | POA: Insufficient documentation

## 2018-02-05 DIAGNOSIS — I509 Heart failure, unspecified: Secondary | ICD-10-CM | POA: Insufficient documentation

## 2018-02-05 DIAGNOSIS — K6282 Dysplasia of anus: Secondary | ICD-10-CM | POA: Insufficient documentation

## 2018-02-05 DIAGNOSIS — Z955 Presence of coronary angioplasty implant and graft: Secondary | ICD-10-CM | POA: Insufficient documentation

## 2018-02-05 DIAGNOSIS — J449 Chronic obstructive pulmonary disease, unspecified: Secondary | ICD-10-CM | POA: Insufficient documentation

## 2018-02-05 DIAGNOSIS — Z791 Long term (current) use of non-steroidal anti-inflammatories (NSAID): Secondary | ICD-10-CM | POA: Diagnosis not present

## 2018-02-05 DIAGNOSIS — E1142 Type 2 diabetes mellitus with diabetic polyneuropathy: Secondary | ICD-10-CM | POA: Insufficient documentation

## 2018-02-05 DIAGNOSIS — I252 Old myocardial infarction: Secondary | ICD-10-CM | POA: Diagnosis not present

## 2018-02-05 DIAGNOSIS — Z79899 Other long term (current) drug therapy: Secondary | ICD-10-CM | POA: Diagnosis not present

## 2018-02-05 DIAGNOSIS — Z79891 Long term (current) use of opiate analgesic: Secondary | ICD-10-CM | POA: Insufficient documentation

## 2018-02-05 DIAGNOSIS — G4733 Obstructive sleep apnea (adult) (pediatric): Secondary | ICD-10-CM | POA: Insufficient documentation

## 2018-02-05 HISTORY — PX: COLONOSCOPY WITH PROPOFOL: SHX5780

## 2018-02-05 HISTORY — PX: POLYPECTOMY: SHX5525

## 2018-02-05 LAB — GLUCOSE, CAPILLARY
Glucose-Capillary: 173 mg/dL — ABNORMAL HIGH (ref 70–99)
Glucose-Capillary: 182 mg/dL — ABNORMAL HIGH (ref 70–99)

## 2018-02-05 SURGERY — COLONOSCOPY WITH PROPOFOL
Anesthesia: General

## 2018-02-05 MED ORDER — HYDROMORPHONE HCL 1 MG/ML IJ SOLN: 0.2500 mg | INTRAMUSCULAR | Status: DC | PRN

## 2018-02-05 MED ORDER — ONDANSETRON HCL 4 MG/2ML IJ SOLN
INTRAMUSCULAR | Status: AC
  Filled 2018-02-05: qty 2

## 2018-02-05 MED ORDER — ONDANSETRON HCL 4 MG/2ML IJ SOLN
INTRAMUSCULAR | Status: DC | PRN
  Administered 2018-02-05: 4 mg via INTRAVENOUS

## 2018-02-05 MED ORDER — PROPOFOL 500 MG/50ML IV EMUL
INTRAVENOUS | Status: DC | PRN
  Administered 2018-02-05: 125 ug/kg/min via INTRAVENOUS
  Administered 2018-02-05: 200 ug/kg/min via INTRAVENOUS
  Administered 2018-02-05: 100 ug/kg/min via INTRAVENOUS

## 2018-02-05 MED ORDER — STERILE WATER FOR IRRIGATION IR SOLN
Status: DC | PRN
  Administered 2018-02-05: 1.5 mL

## 2018-02-05 MED ORDER — MIDAZOLAM HCL 2 MG/2ML IJ SOLN: 0.5000 mg | Freq: Once | INTRAMUSCULAR | Status: DC | PRN

## 2018-02-05 MED ORDER — PROMETHAZINE HCL 25 MG/ML IJ SOLN: 6.2500 mg | INTRAMUSCULAR | Status: DC | PRN

## 2018-02-05 MED ORDER — PROPOFOL 10 MG/ML IV BOLUS
INTRAVENOUS | Status: DC | PRN
  Administered 2018-02-05: 20 mg via INTRAVENOUS
  Administered 2018-02-05: 40 mg via INTRAVENOUS
  Administered 2018-02-05: 20 mg via INTRAVENOUS
  Administered 2018-02-05: 10 mg via INTRAVENOUS
  Administered 2018-02-05 (×4): 20 mg via INTRAVENOUS
  Administered 2018-02-05: 10 mg via INTRAVENOUS
  Administered 2018-02-05: 40 mg via INTRAVENOUS
  Administered 2018-02-05: 20 mg via INTRAVENOUS

## 2018-02-05 MED ORDER — LIDOCAINE HCL URETHRAL/MUCOSAL 2 % EX GEL
1.0000 "application " | Freq: Once | CUTANEOUS | Status: AC
  Administered 2018-02-05: 1 via TOPICAL

## 2018-02-05 MED ORDER — LIDOCAINE HCL URETHRAL/MUCOSAL 2 % EX GEL
CUTANEOUS | Status: AC
  Administered 2018-02-05: 1 via TOPICAL
  Filled 2018-02-05: qty 30

## 2018-02-05 MED ORDER — LIDOCAINE-HYDROCORTISONE ACE 3-0.5 % RE CREA: 1.0000 | TOPICAL_CREAM | Freq: Two times a day (BID) | RECTAL | 0 refills | Status: DC

## 2018-02-05 MED ORDER — HYDROCODONE-ACETAMINOPHEN 7.5-325 MG PO TABS
1.0000 | ORAL_TABLET | Freq: Once | ORAL | Status: AC | PRN
  Administered 2018-02-05: 1 via ORAL
  Filled 2018-02-05: qty 1

## 2018-02-05 MED ORDER — LACTATED RINGERS IV SOLN
INTRAVENOUS | Status: DC
  Administered 2018-02-05: 1000 mL via INTRAVENOUS

## 2018-02-05 NOTE — H&P (Signed)
[name] is an 67 y.o. female.   Chief Complaint: Patient is here for colonoscopy. HPI: Patient is 67 year old Caucasian female with multiple medical problems who has history of colonic polyps and family history of CRC who is here for surveillance colonoscopy.  She used to have chronic diarrhea.  When she was diagnosed with celiac disease and placed on gluten-free diet her diarrhea resolved.  She states she has had diarrhea for the last few days.  It has been nonbloody and she has not had any fever.  She has been off aspirin for 1 week. No polyps were found on her last colonoscopy of May 2013. Family history is positive for CRC in mother who was 57 at the time of diagnosis and died at 33 and her brother also had colon cancer at age 80.  Past Medical History:  Diagnosis Date  . Anxiety and depression   . Arthritis   . CAD (coronary artery disease)    Stent placement circumflex coronary 2007, catheterization 2008 patent stents. Normal LV function  .  Celiac disease; added by Dr. Laural Golden on 02/05/2018   . CHF (congestive heart failure) ([place])   . COPD (chronic obstructive pulmonary disease) (HCC)    no home O2  . Depression   . Diabetes mellitus    Insulin dependent  . Diabetic polyneuropathy (HCC)     severe on multiple medications  . Dyslipidemia   . GERD (gastroesophageal reflux disease)   . Headache(784.0)   . 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 Hypertension   . MI (myocardial infarction) ([place])    2007  . Obstructive sleep apnea    CPAP, setting ?2  . Palpitations   . Tachycardia     Past Surgical History:  Procedure Laterality Date  . ABDOMINAL HYSTERECTOMY    . Arm Surgery Bilateral    due to fall-pt broke both forearms  . BIOPSY N/A 07/01/2013   Procedure: BIOPSY;  Surgeon: Rogene Houston, MD;  [facility];  Service: Endoscopy;  Laterality: N/A;  . CHOLECYSTECTOMY    . COLONOSCOPY  08/06/2011   Procedure: COLONOSCOPY;  Surgeon: Rogene Houston, MD;  [facility];  Service: Endoscopy;  Laterality: N/A;  215  . COLONOSCOPY WITH PROPOFOL N/A 12/31/2012   Procedure: COLONOSCOPY WITH PROPOFOL;  Surgeon: Rogene Houston, MD;  Location: [facility];  Service: Endoscopy;  Laterality: N/A;  in cecum at 0814, total withdrawal time 62mn  . CORONARY ANGIOPLASTY WITH STENT PLACEMENT    . ESOPHAGEAL DILATION N/A 06/07/2015   Procedure: ESOPHAGEAL DILATION;  Surgeon: NRogene Houston MD;  [facility];  Service: Endoscopy;  Laterality: N/A;  . ESOPHAGOGASTRODUODENOSCOPY N/A 06/07/2015   Procedure: ESOPHAGOGASTRODUODENOSCOPY (EGD);  Surgeon: NRogene Houston MD;  [facility];  Service: Endoscopy;  Laterality: N/A;  2:45 - moved to 1:00 - Ann to notify pt  . ESOPHAGOGASTRODUODENOSCOPY (EGD) WITH PROPOFOL N/A 12/31/2012   Procedure: ESOPHAGOGASTRODUODENOSCOPY (EGD) WITH PROPOFOL;  Surgeon: NRogene Houston MD;  [facility];  Service: Endoscopy;  Laterality: N/A;  GE junction 37,  . ESOPHAGOGASTRODUODENOSCOPY (EGD) WITH PROPOFOL N/A 07/01/2013   Procedure: ESOPHAGOGASTRODUODENOSCOPY (EGD) WITH PROPOFOL;  Surgeon: NRogene Houston MD;  [facility];  Service: Endoscopy;  Laterality: N/A;  950  . MALONEY DILATION N/A 12/31/2012   Procedure: MALONEY DILATION;  Surgeon: NRogene Houston MD;  [facility];  Service: Endoscopy;  Laterality: N/A;  used a # 54,#56  . MALONEY DILATION N/A  07/01/2013   Procedure: Venia Minks DILATION;  Surgeon: Rogene Houston, MD;  Location: [facility];  Service: Endoscopy;  Laterality: N/A;  54/58; no heme present    Family History  Problem Relation Age of Onset  . Heart attack Mother   . Colon cancer Mother   . Heart attack Father   . Stroke Sister   . Hemophilia Child   . Cancer Unknown   . Coronary artery disease Unknown   . Cancer Brother   . Cancer Maternal Aunt   . Cancer Maternal Uncle   . Cancer Paternal Aunt   . Cancer Paternal Uncle   . Cancer Brother    Social History:  reports that she has never  smoked. She has never used smokeless tobacco. She reports that she does not drink alcohol or use drugs.  Allergies:  Allergies  Allergen Reactions  . Nitrofuran Derivatives Itching and Swelling  . Amphetamine-Dextroamphet Er Swelling  . Pregabalin Swelling  . Topiramate Other (See Comments)    Tongue tingle  . Verelan [Verapamil] Rash    Medications Prior to Admission  Medication Sig Dispense Refill  . aspirin EC 81 MG tablet Take 81 mg by mouth daily.    Marland Kitchen atorvastatin (LIPITOR) 40 MG tablet Take 40 mg by mouth daily.    Marland Kitchen buPROPion (WELLBUTRIN XL) 150 MG 24 hr tablet Take 150-300 mg by mouth See admin instructions. Take 300 mg by mouth in the morning and take 150 mg by mouth at noon    . diclofenac sodium (VOLTAREN) 1 % GEL APPLY TO BOTH KNEES THREE TIMES DAILY (Patient taking differently: Apply 1 application topically at bedtime. ) 300 g 2  . estrogens, conjugated, (PREMARIN) 1.25 MG tablet Take 1.25 mg by mouth daily.     Marland Kitchen FLUoxetine (PROZAC) 20 MG capsule Take 20 mg by mouth 2 (two) times daily.   1  . furosemide (LASIX) 20 MG tablet Take 20 mg by mouth daily as needed for fluid.   1  . gabapentin (NEURONTIN) 400 MG capsule Take 400 mg by mouth 3 (three) times daily.  5  . hydrOXYzine (ATARAX/VISTARIL) 10 MG tablet Take 10 mg by mouth 4 (four) times daily as needed for anxiety.    . Insulin Glargine (LANTUS SOLOSTAR) 100 UNIT/ML Solostar Pen Inject 70 Units into the skin at bedtime. (Patient taking differently: Inject 60 Units into the skin at bedtime. ) 9 mL 2  . lidocaine (XYLOCAINE) 5 % ointment Apply 1 application topically at bedtime.     Marland Kitchen LORazepam (ATIVAN) 0.5 MG tablet Take 0.5 mg by mouth 2 (two) times daily as needed for anxiety.   1  . meloxicam (MOBIC) 7.5 MG tablet Take 1 tablet (7.5 mg total) by mouth daily. (Patient taking differently: Take 7.5 mg by mouth 2 (two) times daily. )  4  . metFORMIN (GLUCOPHAGE) 500 MG tablet TAKE 1 TABLET BY MOUTH TWICE DAILY with a meal  (Patient taking differently: Take 500 mg by mouth 2 (two) times daily with a meal. ) 60 tablet 2  . methocarbamol (ROBAXIN) 500 MG tablet Take 1 tablet (500 mg total) by mouth 3 (three) times daily. (Patient taking differently: Take 500 mg by mouth 3 (three) times daily as needed for muscle spasms. ) 45 tablet 0  . metoCLOPramide (REGLAN) 5 MG tablet Take 5 mg by mouth 3 (three) times daily as needed for nausea or vomiting.   1  . metoprolol succinate (TOPROL-XL) 50 MG 24 hr tablet TAKE 1 AND  1/2 TABLETS BY MOUTH EVERY DAY (Patient taking differently: Take 75 mg by mouth daily. ) 135 tablet 1  . montelukast (SINGULAIR) 10 MG tablet Take 10 mg by mouth daily.  5  . morphine (MS CONTIN) 30 MG 12 hr tablet Take 1 tablet (30 mg total) by mouth every 8 (eight) hours. 90 tablet 0  . nitroGLYCERIN (NITROSTAT) 0.4 MG SL tablet Place 1 tablet (0.4 mg total) under the tongue every 5 (five) minutes x 3 doses as needed. For chest pains 25 tablet 3  . ondansetron (ZOFRAN) 4 MG tablet Take 4 mg by mouth every 6 (six) hours as needed for nausea or vomiting.    . pantoprazole (PROTONIX) 40 MG tablet TAKE 1 TABLET BY MOUTH TWICE DAILY BEFORE MEALS (Patient taking differently: Take 40 mg by mouth 2 (two) times daily. ) 60 tablet 5  . potassium chloride (K-DUR,KLOR-CON) 10 MEQ tablet Take 10 mEq by mouth 2 (two) times daily.  12  . PROAIR HFA 108 (90 Base) MCG/ACT inhaler Inhale 2 puffs into the lungs every 4 (four) hours as needed for wheezing or shortness of breath.   8  . ranitidine (ZANTAC) 300 MG tablet Take 1 tablet (300 mg total) by mouth at bedtime as needed for heartburn. (Patient taking differently: Take 300 mg by mouth 2 (two) times daily. )  3  . sucralfate (CARAFATE) 1 g tablet Take 1 tablet (1 g total) by mouth 4 (four) times daily -  with meals and at bedtime. 120 tablet 3  . topiramate (TOPAMAX) 200 MG tablet Take 200 mg by mouth at bedtime.  3  . zolpidem (AMBIEN) 10 MG tablet Take 10 mg by mouth at  bedtime.  1  . ACCU-CHEK AVIVA PLUS test strip TEST THREE TIMES DAILY DX: E11.9 (Patient not taking: Reported on 01/26/2018) 150 each 5  . GLOBAL EASE INJECT PEN NEEDLES 31G X 8 MM MISC USE AS DIRECTED FOUR TIMES DAILY (Patient not taking: Reported on 01/26/2018) 100 each 3  . psyllium (METAMUCIL SMOOTH TEXTURE) 28 % packet Take 1 packet by mouth at bedtime. (Patient not taking: Reported on 01/26/2018)      Results for orders placed or performed during the hospital encounter of 02/05/18 (from the past 48 hour(s))  Glucose, capillary     Status: Abnormal   Collection Time: 02/05/18  9:25 AM  Result Value Ref Range   Glucose-Capillary 173 (H) 70 - 99 mg/dL   No results found.  ROS  Pulse (P) 70, temperature (P) 98.5 F (36.9 C), resp. rate (P) 18, SpO2 (P) 96 %. Physical Exam  Constitutional: She appears well-developed and well-nourished.  HENT:  Mouth/Throat: Oropharynx is clear and moist.  Eyes: Conjunctivae are normal. No scleral icterus.  Neck: No thyromegaly present.  Cardiovascular: Normal rate, regular rhythm and normal heart sounds.  No murmur heard. Respiratory: Effort normal and breath sounds normal.  GI:  Abdomen is full.  She has a Pfannenstiel scar.  Abdomen is soft and nontender without organomegaly or masses.  Musculoskeletal: She exhibits edema.  Trace edema around ankles.  Lymphadenopathy:    She has no cervical adenopathy.  Neurological: She is alert.  Skin: Skin is warm and dry.     Assessment/Plan History of colonic polyps. Family history of CRC in mother and brother. Surveillance colonoscopy.  Hildred Laser, MD 02/05/2018, 9:34 AM

## 2018-02-05 NOTE — Anesthesia Postprocedure Evaluation (Addendum)
Anesthesia Post Note  Patient: [name]  Procedure(s) Performed: COLONOSCOPY WITH PROPOFOL (N/A ) POLYPECTOMY  Patient location during evaluation: Short Stay Anesthesia Type: General Level of consciousness: awake and alert and patient cooperative Pain management: satisfactory to patient Vital Signs Assessment: post-procedure vital signs reviewed and stable Respiratory status: spontaneous breathing Cardiovascular status: stable Postop Assessment: no apparent nausea or vomiting Anesthetic complications: no     Last Vitals:  Vitals:   02/05/18 1031 02/05/18 1045  BP: (!) 125/47 (!) 137/57  Pulse: 67 73  Resp: 14 20  Temp: 36.8 C   SpO2: 96% 94%    Last Pain:  Vitals:   02/05/18 1049  PainSc: 10-Worst pain ever                 Damont Balles

## 2018-02-05 NOTE — Op Note (Signed)
[facility] Patient Name: [name] Procedure Date: 02/05/2018 9:17 AM MRN: [id number] Date of Birth: 1950/09/25 Attending MD: Hildred Laser , MD CSN: [id number] Age: 67 Admit Type: Outpatient Procedure:                Colonoscopy Indications:              High risk colon cancer surveillance: Personal                            history of colonic polyps, Family history of colon                            cancer in multiple first-degree relatives Providers:                Hildred Laser, MD, Charlsie Quest. Theda Sers RN, RN, Aram Candela Referring MD:             Glenda Chroman, MD Medicines:                Propofol per Anesthesia Complications:            No immediate complications. Estimated Blood Loss:     Estimated blood loss was minimal. Procedure:                Pre-Anesthesia Assessment:                           - Prior to the procedure, a History and Physical                            was performed, and patient medications and                            allergies were reviewed. The patient's tolerance of                            previous anesthesia was also reviewed. The risks                            and benefits of the procedure and the sedation                            options and risks were discussed with the patient.                            All questions were answered, and informed consent                            was obtained. Prior Anticoagulants: The patient                            last took aspirin 3 days and previous NSAID  medication 2 days prior to the procedure. ASA Grade                            Assessment: III - A patient with severe systemic                            disease. After reviewing the risks and benefits,                            the patient was deemed in satisfactory condition to                            undergo the procedure.                           After obtaining  informed consent, the colonoscope                            was passed under direct vision. Throughout the                            procedure, the patient's blood pressure, pulse, and                            oxygen saturations were monitored continuously. The                            PCF-H190DL ([id number]) scope was introduced through                            the and advanced to the the cecum, identified by                            appendiceal orifice and ileocecal valve. The                            colonoscopy was performed without difficulty. The                            patient tolerated the procedure well. The quality                            of the bowel preparation was excellent. The                            ileocecal valve, appendiceal orifice, and rectum                            were photographed. Scope In: 9:56:32 AM Scope Out: 10:25:38 AM Scope Withdrawal Time: 0 hours 21 minutes 0 seconds  Total Procedure Duration: 0 hours 29 minutes 6 seconds  Findings:      The perianal and digital rectal examinations were normal.      The colon (entire examined portion) appeared normal.  A 5 to 10 mm polyp was found in the rectum. The polyp was sessile. The       polyp was removed with a piecemeal technique using a hot snare.       Resection was complete, but the polyp tissue was only partially       retrieved. [Clip Device]. Coagulation for destruction of remaining       portion of lesion using argon plasma was successful.      A scar was found in the rectum.      External hemorrhoids were found during retroflexion. The hemorrhoids       were small. Impression:               - The entire examined colon is normal.                           - One 5 to 10 mm polyp in the rectum, removed                            piecemeal using a hot snare. Incomplete resection.                            Partial retrieval. Remaining polyp treated with                             argon plasma coagulation (APC).                           - Scar in the rectum.                           - External hemorrhoids. Moderate Sedation:      Per Anesthesia Care Recommendation:           - Patient has a contact number available for                            emergencies. The signs and symptoms of potential                            delayed complications were discussed with the                            patient. Return to normal activities tomorrow.                            Written discharge instructions were provided to the                            patient.                           - Resume previous diet today.                           - Continue present medications.                           -  Resume aspirin at prior dose in 1 week.                           - Await pathology results.                           - ProctoFoam-HC: Apply to anal canal BID for 7 days. Procedure Code(s):        --- Professional ---                           [phone], Colonoscopy, flexible; with removal of                            tumor(s), polyp(s), or other lesion(s) by snare                            technique Diagnosis Code(s):        --- Professional ---                           Z86.010, Personal history of colonic polyps                           K64.4, Residual hemorrhoidal skin tags                           K62.1, Rectal polyp                           K62.89, Other specified diseases of anus and rectum                           Z80.0, Family history of malignant neoplasm of                            digestive organs CPT copyright 2018 American Medical Association. All rights reserved. The codes documented in this report are preliminary and upon coder review may  be revised to meet current compliance requirements. Hildred Laser, MD Hildred Laser, MD 02/05/2018 10:41:40 AM This report has been signed electronically. Number of Addenda: 0

## 2018-02-05 NOTE — Anesthesia Procedure Notes (Signed)
Procedure Name: MAC Date/Time: 02/05/2018 9:47 AM Performed by: Vista Deck, CRNA Pre-anesthesia Checklist: Patient identified, Emergency Drugs available, Suction available, Timeout performed and Patient being monitored Patient Re-evaluated:Patient Re-evaluated prior to induction Oxygen Delivery Method: Non-rebreather mask

## 2018-02-05 NOTE — Discharge Instructions (Signed)
Colonoscopy, Adult, Care After This sheet gives you information about how to care for yourself after your procedure. Your doctor may also give you more specific instructions. If you have problems or questions, call your doctor. Follow these instructions at home: General instructions   For the first 24 hours after the procedure: ? Do not drive or use machinery. ? Do not sign important documents. ? Do not drink alcohol. ? Do your daily activities more slowly than normal. ? Eat foods that are soft and easy to digest. ? Rest often.  Take over-the-counter or prescription medicines only as told by your doctor.  It is up to you to get the results of your procedure. Ask your doctor, or the department performing the procedure, when your results will be ready. To help cramping and bloating:  Try walking around.  Put heat on your belly (abdomen) as told by your doctor. Use a heat source that your doctor recommends, such as a moist heat pack or a heating pad. ? Put a towel between your skin and the heat source. ? Leave the heat on for 20-30 minutes. ? Remove the heat if your skin turns bright red. This is especially important if you cannot feel pain, heat, or cold. You can get burned. Eating and drinking  Drink enough fluid to keep your pee (urine) clear or pale yellow.  Return to your normal diet as told by your doctor. Avoid heavy or fried foods that are hard to digest.  Avoid drinking alcohol for as long as told by your doctor. Contact a doctor if:  You have blood in your poop (stool) 2-3 days after the procedure. Get help right away if:  You have more than a small amount of blood in your poop.  You see large clumps of tissue (blood clots) in your poop.  Your belly is swollen.  You feel sick to your stomach (nauseous).  You throw up (vomit).  You have a fever.  You have belly pain that gets worse, and medicine does not help your pain. This information is not intended to  replace advice given to you by your health care provider. Make sure you discuss any questions you have with your health care provider. Document Released: 04/05/2010 Document Revised: 11/26/2015 Document Reviewed: 11/26/2015 Elsevier Interactive Patient Education  2017 [employer].  Colon Polyps Polyps are tissue growths inside the body. Polyps can grow in many places, including the large intestine (colon). A polyp may be a round bump or a mushroom-shaped growth. You could have one polyp or several. Most colon polyps are noncancerous (benign). However, some colon polyps can become cancerous over time. What are the causes? The exact cause of colon polyps is not known. What increases the risk? This condition is more likely to develop in people who:  Have a family history of colon cancer or colon polyps.  Are older than 7 or older than 45 if they are African American.  Have inflammatory bowel disease, such as ulcerative colitis or Crohn disease.  Are overweight.  Smoke cigarettes.  Do not get enough exercise.  Drink too much alcohol.  Eat a diet that is: ? High in fat and red meat. ? Low in fiber.  Had childhood cancer that was treated with abdominal radiation.  What are the signs or symptoms? Most polyps do not cause symptoms. If you have symptoms, they may include:  Blood coming from your rectum when having a bowel movement.  Blood in your stool.The stool may look dark  red or black.  A change in bowel habits, such as constipation or diarrhea.  How is this diagnosed? This condition is diagnosed with a colonoscopy. This is a procedure that uses a lighted, flexible scope to look at the inside of your colon. How is this treated? Treatment for this condition involves removing any polyps that are found. Those polyps will then be tested for cancer. If cancer is found, your health care provider will talk to you about options for colon cancer treatment. Follow these instructions  at home: Diet  Eat plenty of fiber, such as fruits, vegetables, and whole grains.  Eat foods that are high in calcium and vitamin D, such as milk, cheese, yogurt, eggs, liver, fish, and broccoli.  Limit foods high in fat, red meats, and processed meats, such as hot dogs, sausage, bacon, and lunch meats.  Maintain a healthy weight, or lose weight if recommended by your health care provider. General instructions  Do not smoke cigarettes.  Do not drink alcohol excessively.  Keep all follow-up visits as told by your health care provider. This is important. This includes keeping regularly scheduled colonoscopies. Talk to your health care provider about when you need a colonoscopy.  Exercise every day or as told by your health care provider. Contact a health care provider if:  You have new or worsening bleeding during a bowel movement.  You have new or increased blood in your stool.  You have a change in bowel habits.  You unexpectedly lose weight. This information is not intended to replace advice given to you by your health care provider. Make sure you discuss any questions you have with your health care provider. Document Released: 11/28/2003 Document Revised: 08/09/2015 Document Reviewed: 01/22/2015 Elsevier Interactive Patient Education  2018 [employer].   Resume aspirin in 1 week. Can resume meloxicam 02/10/2018. Lidocaine/hydrocortisone cream to be applied to anal canal twice daily for 1 week.Marland Kitchen Resume other medications as before. Resume usual medications. No driving for 24 hours. Physician will call with biopsy results.

## 2018-02-05 NOTE — Anesthesia Preprocedure Evaluation (Signed)
Anesthesia Evaluation  Patient identified by MRN, date of birth, ID band Patient awake    Reviewed: Allergy & Precautions, NPO status , Patient's Chart, lab work & pertinent test results  Airway Mallampati: II  TM Distance: >3 FB Neck ROM: Full    Dental no notable dental hx. (+) Teeth Intact   Pulmonary sleep apnea and Continuous Positive Airway Pressure Ventilation , COPD,  COPD inhaler,    Pulmonary exam normal breath sounds clear to auscultation       Cardiovascular Exercise Tolerance: Poor hypertension, Pt. on medications + CAD, + Past MI and +CHF  Normal cardiovascular examII Rhythm:Regular Rate:Normal  States MI/2 stent 2007 Denies recent CP  Denies NTG use in 3 years    Neuro/Psych  Headaches, Anxiety Depression  Neuromuscular disease negative psych ROS   GI/Hepatic Neg liver ROS, GERD  Medicated and Controlled,  Endo/Other  negative endocrine ROSdiabetes, Well Controlled, Type 1  Renal/GU negative Renal ROS  negative genitourinary   Musculoskeletal  (+) Arthritis ,   Abdominal   Peds negative pediatric ROS (+)  Hematology negative hematology ROS (+)   Anesthesia Other Findings   Reproductive/Obstetrics negative OB ROS                             Anesthesia Physical Anesthesia Plan  ASA: III  Anesthesia Plan: General   Post-op Pain Management:    Induction: Intravenous  PONV Risk Score and Plan:   Airway Management Planned: Nasal Cannula and Simple Face Mask  Additional Equipment:   Intra-op Plan:   Post-operative Plan:   Informed Consent: I have reviewed the patients History and Physical, chart, labs and discussed the procedure including the risks, benefits and alternatives for the proposed anesthesia with the patient or authorized representative who has indicated his/her understanding and acceptance.   Dental advisory given  Plan Discussed with:  CRNA  Anesthesia Plan Comments:         Anesthesia Quick Evaluation

## 2018-02-05 NOTE — Transfer of Care (Signed)
Immediate Anesthesia Transfer of Care Note  Patient: [name]  Procedure(s) Performed: COLONOSCOPY WITH PROPOFOL (N/A ) POLYPECTOMY  Patient Location: PACU  Anesthesia Type:General  Level of Consciousness: awake, alert  and patient cooperative  Airway & Oxygen Therapy: Patient Spontanous Breathing  Post-op Assessment: Report given to RN and Post -op Vital signs reviewed and stable  Post vital signs: Reviewed and stable  Last Vitals:  Vitals Value Taken Time  BP 125/47 02/05/2018 10:33 AM  Temp    Pulse 66 02/05/2018 10:36 AM  Resp 16 02/05/2018 10:36 AM  SpO2 94 % 02/05/2018 10:36 AM  Vitals shown include unvalidated device data.  Last Pain:  Vitals:   02/05/18 0950  PainSc: 7          Complications: No apparent anesthesia complications

## 2018-02-09 ENCOUNTER — Ambulatory Visit: Payer: Medicaid Other | Admitting: Physical Medicine & Rehabilitation

## 2018-02-09 ENCOUNTER — Encounter: Payer: Medicaid Other | Attending: Physical Medicine & Rehabilitation

## 2018-02-09 ENCOUNTER — Encounter: Payer: Self-pay | Admitting: Physical Medicine & Rehabilitation

## 2018-02-09 VITALS — BP 134/68 | HR 62 | Ht 62.0 in | Wt 220.0 lb

## 2018-02-09 DIAGNOSIS — Z1401 Asymptomatic hemophilia A carrier: Secondary | ICD-10-CM | POA: Insufficient documentation

## 2018-02-09 DIAGNOSIS — R000 Tachycardia, unspecified: Secondary | ICD-10-CM | POA: Diagnosis not present

## 2018-02-09 DIAGNOSIS — M1990 Unspecified osteoarthritis, unspecified site: Secondary | ICD-10-CM | POA: Diagnosis not present

## 2018-02-09 DIAGNOSIS — E785 Hyperlipidemia, unspecified: Secondary | ICD-10-CM | POA: Insufficient documentation

## 2018-02-09 DIAGNOSIS — R002 Palpitations: Secondary | ICD-10-CM | POA: Insufficient documentation

## 2018-02-09 DIAGNOSIS — Z794 Long term (current) use of insulin: Secondary | ICD-10-CM | POA: Insufficient documentation

## 2018-02-09 DIAGNOSIS — R079 Chest pain, unspecified: Secondary | ICD-10-CM | POA: Diagnosis not present

## 2018-02-09 DIAGNOSIS — M48062 Spinal stenosis, lumbar region with neurogenic claudication: Secondary | ICD-10-CM | POA: Diagnosis not present

## 2018-02-09 DIAGNOSIS — M5489 Other dorsalgia: Secondary | ICD-10-CM | POA: Diagnosis not present

## 2018-02-09 DIAGNOSIS — M79605 Pain in left leg: Secondary | ICD-10-CM | POA: Diagnosis not present

## 2018-02-09 DIAGNOSIS — G4700 Insomnia, unspecified: Secondary | ICD-10-CM | POA: Diagnosis not present

## 2018-02-09 DIAGNOSIS — E1142 Type 2 diabetes mellitus with diabetic polyneuropathy: Secondary | ICD-10-CM | POA: Insufficient documentation

## 2018-02-09 DIAGNOSIS — M47896 Other spondylosis, lumbar region: Secondary | ICD-10-CM | POA: Diagnosis not present

## 2018-02-09 DIAGNOSIS — M79604 Pain in right leg: Secondary | ICD-10-CM | POA: Insufficient documentation

## 2018-02-09 DIAGNOSIS — F418 Other specified anxiety disorders: Secondary | ICD-10-CM | POA: Diagnosis not present

## 2018-02-09 DIAGNOSIS — I252 Old myocardial infarction: Secondary | ICD-10-CM | POA: Insufficient documentation

## 2018-02-09 DIAGNOSIS — R51 Headache: Secondary | ICD-10-CM | POA: Insufficient documentation

## 2018-02-09 DIAGNOSIS — G4733 Obstructive sleep apnea (adult) (pediatric): Secondary | ICD-10-CM | POA: Insufficient documentation

## 2018-02-09 DIAGNOSIS — Z760 Encounter for issue of repeat prescription: Secondary | ICD-10-CM | POA: Insufficient documentation

## 2018-02-09 DIAGNOSIS — G8929 Other chronic pain: Secondary | ICD-10-CM | POA: Diagnosis not present

## 2018-02-09 DIAGNOSIS — I2510 Atherosclerotic heart disease of native coronary artery without angina pectoris: Secondary | ICD-10-CM | POA: Diagnosis not present

## 2018-02-09 DIAGNOSIS — J449 Chronic obstructive pulmonary disease, unspecified: Secondary | ICD-10-CM | POA: Diagnosis not present

## 2018-02-09 DIAGNOSIS — I509 Heart failure, unspecified: Secondary | ICD-10-CM | POA: Insufficient documentation

## 2018-02-09 DIAGNOSIS — K219 Gastro-esophageal reflux disease without esophagitis: Secondary | ICD-10-CM | POA: Insufficient documentation

## 2018-02-09 MED ORDER — MORPHINE SULFATE ER 30 MG PO TBCR: 30.0000 mg | EXTENDED_RELEASE_TABLET | Freq: Three times a day (TID) | ORAL | 0 refills | Status: DC

## 2018-02-09 NOTE — Progress Notes (Signed)
Subjective:    Patient ID: [name], female    DOB: 28-Apr-1950, 67 y.o.   MRN: [id number]  HPI F/u for weakness of legs Had to reschedule MRI for tomorrow Has f/u with PCP re urinary incont and pain during urination, treated with abx for UTI Leg pain on left Pt fell 12 times when not using walker to ambulation  Takes Gabapentin for diabetic nerve pain MS Contin 44m TID Pain Inventory Average Pain 8 Pain Right Now 6 My pain is stabbing  In the last 24 hours, has pain interfered with the following? General activity 5 Relation with others 5 Enjoyment of life 2 What TIME of day is your pain at its worst? evening Sleep (in general) Poor  Pain is worse with: walking, sitting, inactivity and some activites Pain improves with: rest and medication Relief from Meds: 5  Mobility walk with assistance use a walker ability to climb steps?  no do you drive?  no  Function disabled: date disabled . I need assistance with the following:  dressing, bathing, meal prep, household duties and shopping  Neuro/Psych bladder control problems weakness numbness tremor tingling trouble walking spasms dizziness confusion depression anxiety loss of taste or smell  Prior Studies Any changes since last visit?  no  Physicians involved in your care Any changes since last visit?  no   Family History  Problem Relation Age of Onset  . Heart attack Mother   . Colon cancer Mother   . Heart attack Father   . Stroke Sister   . Hemophilia Child   . Cancer Unknown   . Coronary artery disease Unknown   . Cancer Brother   . Cancer Maternal Aunt   . Cancer Maternal Uncle   . Cancer Paternal Aunt   . Cancer Paternal Uncle   . Cancer Brother    Social History   Socioeconomic History  . Marital status: Legally Separated    Spouse name: Not on file  . Number of children: 4  . Years of education: Not on file  . Highest education level: Not on file  Occupational History  .  Occupation: Disabled  Social Needs  . Financial resource strain: Not on file  . Food insecurity:    Worry: Not on file    Inability: Not on file  . Transportation needs:    Medical: Not on file    Non-medical: Not on file  Tobacco Use  . Smoking status: Never Smoker  . Smokeless tobacco: Never Used  Substance and Sexual Activity  . Alcohol use: No    Alcohol/week: 0.0 standard drinks  . Drug use: No  . Sexual activity: Not on file  Lifestyle  . Physical activity:    Days per week: Not on file    Minutes per session: Not on file  . Stress: Not on file  Relationships  . Social connections:    Talks on phone: Not on file    Gets together: Not on file    Attends religious service: Not on file    Active member of club or organization: Not on file    Attends meetings of clubs or organizations: Not on file    Relationship status: Not on file  Other Topics Concern  . Not on file  Social History Narrative   Patient does not get regular exercise   Denies caffeine use    Past Surgical History:  Procedure Laterality Date  . ABDOMINAL HYSTERECTOMY    . Arm Surgery Bilateral  due to fall-pt broke both forearms  . BIOPSY N/A 07/01/2013   Procedure: BIOPSY;  Surgeon: Rogene Houston, MD;  Location: [facility];  Service: Endoscopy;  Laterality: N/A;  . CHOLECYSTECTOMY    . COLONOSCOPY  08/06/2011   Procedure: COLONOSCOPY;  Surgeon: Rogene Houston, MD;  Location: [facility];  Service: Endoscopy;  Laterality: N/A;  215  . COLONOSCOPY WITH PROPOFOL N/A 12/31/2012   Procedure: COLONOSCOPY WITH PROPOFOL;  Surgeon: Rogene Houston, MD;  Location: [facility];  Service: Endoscopy;  Laterality: N/A;  in cecum at 0814, total withdrawal time 57mn  . CORONARY ANGIOPLASTY WITH STENT PLACEMENT    . ESOPHAGEAL DILATION N/A 06/07/2015   Procedure: ESOPHAGEAL DILATION;  Surgeon: NRogene Houston MD;  [facility];  Service: Endoscopy;  Laterality: N/A;  . ESOPHAGOGASTRODUODENOSCOPY N/A  06/07/2015   Procedure: ESOPHAGOGASTRODUODENOSCOPY (EGD);  Surgeon: NRogene Houston MD;  [facility];  Service: Endoscopy;  Laterality: N/A;  2:45 - moved to 1:00 - Ann to notify pt  . ESOPHAGOGASTRODUODENOSCOPY (EGD) WITH PROPOFOL N/A 12/31/2012   Procedure: ESOPHAGOGASTRODUODENOSCOPY (EGD) WITH PROPOFOL;  Surgeon: NRogene Houston MD;  Location: [facility];  Service: Endoscopy;  Laterality: N/A;  GE junction 37,  . ESOPHAGOGASTRODUODENOSCOPY (EGD) WITH PROPOFOL N/A 07/01/2013   Procedure: ESOPHAGOGASTRODUODENOSCOPY (EGD) WITH PROPOFOL;  Surgeon: NRogene Houston MD;  Location: [facility];  Service: Endoscopy;  Laterality: N/A;  950  . MALONEY DILATION N/A 12/31/2012   Procedure: MALONEY DILATION;  Surgeon: NRogene Houston MD;  Location: [facility];  Service: Endoscopy;  Laterality: N/A;  used a # 54,#56  . MALONEY DILATION N/A 07/01/2013   Procedure: MVenia MinksDILATION;  Surgeon: NRogene Houston MD;  Location: [facility];  Service: Endoscopy;  Laterality: N/A;  54/58; no heme present   Past Medical History:  Diagnosis Date  . Anxiety and depression   . Arthritis   . CAD (coronary artery disease)    Stent placement circumflex coronary 2007, catheterization 2008 patent stents. Normal LV function  . Chest pain   . CHF (congestive heart failure) ([facility]   . COPD (chronic obstructive pulmonary disease) (HCC)    no home O2  . Depression   . Diabetes mellitus    Insulin dependent  . Diabetic polyneuropathy (HCC)     severe on multiple medications  . Dyslipidemia   . GERD (gastroesophageal reflux disease)   . Headache(784.0)   . 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 Hypertension   . MI ([place]ocardial infarction) ([place]    2007  . Obstructive sleep apnea    CPAP, setting ?2  . Palpitations   . Tachycardia    BP 134/68   Pulse 62   Ht 5' 2"  (1.575 m)   Wt 220 lb (99.8 kg)   SpO2 95%   BMI 40.24 kg/m   Opioid Risk Score:   Fall Risk Score:  `1  Depression screen PHQ 2/9  [facility]  06/12/2017 04/14/2017 02/17/2017 01/14/2017 12/24/2016 11/11/2016 10/28/2016  Decreased Interest 3 3 0 3 0 0 2  Down, Depressed, Hopeless 3 3 0 3 0 0 -  PHQ - 2 Score 6 6 0 6 0 0 2  Altered sleeping 3 - - - - - -  Tired, decreased energy 3 - - - - - -  Change in appetite 0 - - - - - -  Feeling bad or failure about yourself  3 - - - - - -  Trouble concentrating  3 - - - - - -  Moving slowly or fidgety/restless 1 - - - - - -  Suicidal thoughts 0 - - - - - -  PHQ-9 Score 19 - - - - - -  Difficult doing work/chores Very difficult - - - - - -  Some recent data might be hidden     Review of Systems  Constitutional: Positive for unexpected weight change.  HENT: Negative.   Eyes: Negative.   Respiratory: Negative.   Cardiovascular: Negative.   Gastrointestinal: Positive for abdominal pain, constipation, nausea and vomiting.  Endocrine: Negative.   Genitourinary: Negative.   Musculoskeletal: Positive for arthralgias, back pain, gait problem and myalgias.  Skin: Negative.   Allergic/Immunologic: Negative.   Neurological: Positive for dizziness, tremors, weakness and numbness.  Hematological: Negative.   Psychiatric/Behavioral: Positive for confusion and dysphoric mood. The patient is nervous/anxious.   All other systems reviewed and are negative.      Objective:   Physical Exam  Constitutional: She is oriented to person, place, and time. She appears well-developed and well-nourished. No distress.  HENT:  Head: Normocephalic and atraumatic.  Eyes: Pupils are equal, round, and reactive to light. EOM are normal.  Musculoskeletal:       Lumbar back: She exhibits decreased range of motion and tenderness.  Neurological: She is alert and oriented to person, place, and time. Gait normal.  Negative straight leg raising  Skin: Skin is warm and dry. She is not diaphoretic.  Psychiatric: She has a normal mood and affect.  Nursing note and vitals reviewed.   Reduce Left L4-5 S1 dermatomal LT  and pp sensation      Assessment & Plan:  1.  Lumbar spinal stenosis has increasing weakness in the legs symptomatically with knee buckling however exam does not demonstrate any motor weakness.  She does have decreased sensation in the left lower limb We will repeat MRI to assess for progression of her lumbar spinal stenosis.  Further treatment options will be discussed once the results are available. Medication and dose: MS Contin 67m q 8 H # pills per month: 90 Last UDS date: 09/02/2017 Opioid Treatment Agreement signed (Y/N): y Opioid Treatment Agreement last reviewed with patient:   N[facility]SRS reviewed this encounter (include red flags):  Y PCP prescribing lorazepam and ambien on chronic basis

## 2018-02-09 NOTE — Patient Instructions (Signed)
Will review MRI when available May need to advise on injections vs surgical options

## 2018-02-10 ENCOUNTER — Other Ambulatory Visit: Payer: Self-pay | Admitting: "Endocrinology

## 2018-02-10 ENCOUNTER — Ambulatory Visit (HOSPITAL_COMMUNITY)
Admission: RE | Admit: 2018-02-10 | Discharge: 2018-02-10 | Disposition: A | Discharge status: Home / Self Care | Payer: Medicaid Other | Source: Ambulatory Visit | Attending: Physical Medicine & Rehabilitation | Admitting: Physical Medicine & Rehabilitation

## 2018-02-10 ENCOUNTER — Encounter (HOSPITAL_COMMUNITY): Payer: Self-pay | Admitting: Internal Medicine

## 2018-02-10 DIAGNOSIS — M5136 Other intervertebral disc degeneration, lumbar region: Secondary | ICD-10-CM | POA: Insufficient documentation

## 2018-02-10 DIAGNOSIS — M48061 Spinal stenosis, lumbar region without neurogenic claudication: Secondary | ICD-10-CM | POA: Diagnosis not present

## 2018-02-10 DIAGNOSIS — M4316 Spondylolisthesis, lumbar region: Secondary | ICD-10-CM | POA: Insufficient documentation

## 2018-02-15 ENCOUNTER — Telehealth: Payer: Self-pay | Admitting: *Deleted

## 2018-02-15 NOTE — Telephone Encounter (Signed)
-----   Message from Charlett Blake, MD sent at 02/10/2018  1:31 PM EST ----- Worsening of arthritis but no significant nerve root compression

## 2018-02-15 NOTE — Telephone Encounter (Signed)
Notified Ms Breuer.

## 2018-02-23 ENCOUNTER — Other Ambulatory Visit: Payer: Self-pay | Admitting: "Endocrinology

## 2018-03-03 ENCOUNTER — Encounter: Payer: Self-pay | Admitting: Registered Nurse

## 2018-03-03 ENCOUNTER — Encounter: Payer: Medicare Other | Attending: Registered Nurse | Admitting: Registered Nurse

## 2018-03-03 VITALS — BP 156/82 | HR 68 | Resp 14

## 2018-03-03 DIAGNOSIS — G8929 Other chronic pain: Secondary | ICD-10-CM

## 2018-03-03 DIAGNOSIS — I25118 Atherosclerotic heart disease of native coronary artery with other forms of angina pectoris: Secondary | ICD-10-CM

## 2018-03-03 DIAGNOSIS — M545 Low back pain: Secondary | ICD-10-CM | POA: Diagnosis not present

## 2018-03-03 DIAGNOSIS — J449 Chronic obstructive pulmonary disease, unspecified: Secondary | ICD-10-CM | POA: Diagnosis not present

## 2018-03-03 DIAGNOSIS — G4700 Insomnia, unspecified: Secondary | ICD-10-CM | POA: Insufficient documentation

## 2018-03-03 DIAGNOSIS — I509 Heart failure, unspecified: Secondary | ICD-10-CM | POA: Insufficient documentation

## 2018-03-03 DIAGNOSIS — I110 Hypertensive heart disease with heart failure: Secondary | ICD-10-CM | POA: Diagnosis not present

## 2018-03-03 DIAGNOSIS — M546 Pain in thoracic spine: Secondary | ICD-10-CM | POA: Diagnosis not present

## 2018-03-03 DIAGNOSIS — M542 Cervicalgia: Secondary | ICD-10-CM | POA: Diagnosis not present

## 2018-03-03 DIAGNOSIS — M25562 Pain in left knee: Secondary | ICD-10-CM | POA: Insufficient documentation

## 2018-03-03 DIAGNOSIS — I252 Old myocardial infarction: Secondary | ICD-10-CM | POA: Diagnosis not present

## 2018-03-03 DIAGNOSIS — M1712 Unilateral primary osteoarthritis, left knee: Secondary | ICD-10-CM

## 2018-03-03 DIAGNOSIS — M48062 Spinal stenosis, lumbar region with neurogenic claudication: Secondary | ICD-10-CM | POA: Diagnosis not present

## 2018-03-03 DIAGNOSIS — M4726 Other spondylosis with radiculopathy, lumbar region: Secondary | ICD-10-CM | POA: Insufficient documentation

## 2018-03-03 DIAGNOSIS — F411 Generalized anxiety disorder: Secondary | ICD-10-CM

## 2018-03-03 DIAGNOSIS — I2510 Atherosclerotic heart disease of native coronary artery without angina pectoris: Secondary | ICD-10-CM | POA: Diagnosis not present

## 2018-03-03 DIAGNOSIS — M7062 Trochanteric bursitis, left hip: Secondary | ICD-10-CM | POA: Insufficient documentation

## 2018-03-03 DIAGNOSIS — Z823 Family history of stroke: Secondary | ICD-10-CM | POA: Insufficient documentation

## 2018-03-03 DIAGNOSIS — F419 Anxiety disorder, unspecified: Secondary | ICD-10-CM | POA: Diagnosis not present

## 2018-03-03 DIAGNOSIS — M1711 Unilateral primary osteoarthritis, right knee: Secondary | ICD-10-CM | POA: Diagnosis not present

## 2018-03-03 DIAGNOSIS — Z8249 Family history of ischemic heart disease and other diseases of the circulatory system: Secondary | ICD-10-CM | POA: Diagnosis not present

## 2018-03-03 DIAGNOSIS — M7061 Trochanteric bursitis, right hip: Secondary | ICD-10-CM | POA: Diagnosis not present

## 2018-03-03 DIAGNOSIS — K219 Gastro-esophageal reflux disease without esophagitis: Secondary | ICD-10-CM | POA: Diagnosis not present

## 2018-03-03 DIAGNOSIS — F329 Major depressive disorder, single episode, unspecified: Secondary | ICD-10-CM | POA: Diagnosis not present

## 2018-03-03 DIAGNOSIS — E785 Hyperlipidemia, unspecified: Secondary | ICD-10-CM | POA: Diagnosis not present

## 2018-03-03 DIAGNOSIS — M25561 Pain in right knee: Secondary | ICD-10-CM | POA: Diagnosis not present

## 2018-03-03 DIAGNOSIS — M5416 Radiculopathy, lumbar region: Secondary | ICD-10-CM

## 2018-03-03 DIAGNOSIS — E1142 Type 2 diabetes mellitus with diabetic polyneuropathy: Secondary | ICD-10-CM

## 2018-03-03 DIAGNOSIS — Z955 Presence of coronary angioplasty implant and graft: Secondary | ICD-10-CM | POA: Diagnosis not present

## 2018-03-03 DIAGNOSIS — G4733 Obstructive sleep apnea (adult) (pediatric): Secondary | ICD-10-CM | POA: Insufficient documentation

## 2018-03-03 MED ORDER — MORPHINE SULFATE ER 30 MG PO TBCR: 30.0000 mg | EXTENDED_RELEASE_TABLET | Freq: Three times a day (TID) | ORAL | 0 refills | Status: DC

## 2018-03-03 NOTE — Progress Notes (Signed)
Subjective:    Patient ID: [name], female    DOB: [birth year], 67 y.o.   MRN: [id number]  HPI: [name] is a 67 y.o. female who returns for follow up appointment for chronic pain and medication refill. She states her pain is located in her lower back radiating into her bilateral lower extremities, bilateral hips and bilateral knee pain. She rates her pain 7. Her current exercise regime is walking with her walker in the home, arrived in wheelchair.   [name] equivalent is 90.00 MME. She is also prescribed Lorazepam  By Noemi Chapel .We have discussed the black box warning of using opioids and benzodiazepines. I highlighted the dangers of using these drugs together and discussed the adverse events including respiratory suppression, overdose, cognitive impairment and importance of compliance with current regimen. We will continue to monitor and adjust as indicated.  she is being closely monitored and under the care of her psychiatrist Dr. Casimiro Needle   Pain Inventory Average Pain 7 Pain Right Now 7 My pain is sharp, burning, dull, tingling and aching  In the last 24 hours, has pain interfered with the following? General activity 7 Relation with others 5 Enjoyment of life 7 What TIME of day is your pain at its worst? morning, night  Sleep (in general) Poor  Pain is worse with: walking, bending and standing Pain improves with: rest and heat/ice Relief from Meds: 2  Mobility walk with assistance use a walker ability to climb steps?  no do you drive?  no use a wheelchair Do you have any goals in this area?  no  Function Do you have any goals in this area?  no  Neuro/Psych weakness numbness tingling trouble walking dizziness confusion depression anxiety  Prior Studies Any changes since last visit?  no  Physicians involved in your care Any changes since last visit?  no   Family History  Problem Relation Age of Onset  . Heart attack Mother   .  Colon cancer Mother   . Heart attack Father   . Stroke Sister   . Hemophilia Child   . Cancer Other   . Coronary artery disease Other   . Cancer Brother   . Cancer Maternal Aunt   . Cancer Maternal Uncle   . Cancer Paternal Aunt   . Cancer Paternal Uncle   . Cancer Brother    Social History   Socioeconomic History  . Marital status: Legally Separated    Spouse name: Not on file  . Number of children: 4  . Years of education: Not on file  . Highest education level: Not on file  Occupational History  . Occupation: Disabled  Social Needs  . Financial resource strain: Not on file  . Food insecurity:    Worry: Not on file    Inability: Not on file  . Transportation needs:    Medical: Not on file    Non-medical: Not on file  Tobacco Use  . Smoking status: Never Smoker  . Smokeless tobacco: Never Used  Substance and Sexual Activity  . Alcohol use: No    Alcohol/week: 0.0 standard drinks  . Drug use: No  . Sexual activity: Not on file  Lifestyle  . Physical activity:    Days per week: Not on file    Minutes per session: Not on file  . Stress: Not on file  Relationships  . Social connections:    Talks on phone: Not on file    Gets  together: Not on file    Attends religious service: Not on file    Active member of club or organization: Not on file    Attends meetings of clubs or organizations: Not on file    Relationship status: Not on file  Other Topics Concern  . Not on file  Social History Narrative   Patient does not get regular exercise   Denies caffeine use    Past Surgical History:  Procedure Laterality Date  . ABDOMINAL HYSTERECTOMY    . Arm Surgery Bilateral    due to fall-pt broke both forearms  . BIOPSY N/A 07/01/2013   Procedure: BIOPSY;  Surgeon: Rogene Houston, MD;  Location: [facility];  Service: Endoscopy;  Laterality: N/A;  . CHOLECYSTECTOMY    . COLONOSCOPY  08/06/2011   Procedure: COLONOSCOPY;  Surgeon: Rogene Houston, MD;  [facility];  Service: Endoscopy;  Laterality: N/A;  215  . COLONOSCOPY WITH PROPOFOL N/A 12/31/2012   Procedure: COLONOSCOPY WITH PROPOFOL;  Surgeon: Rogene Houston, MD;  Location: [facility];  Service: Endoscopy;  Laterality: N/A;  in cecum at 0814, total withdrawal time 8min  . COLONOSCOPY WITH PROPOFOL N/A 02/05/2018   Procedure: COLONOSCOPY WITH PROPOFOL;  Surgeon: Rogene Houston, MD;  [facility];  Service: Endoscopy;  Laterality: N/A;  1:25  . CORONARY ANGIOPLASTY WITH STENT PLACEMENT    . ESOPHAGEAL DILATION N/A 06/07/2015   Procedure: ESOPHAGEAL DILATION;  Surgeon: Rogene Houston, MD;  [facility];  Service: Endoscopy;  Laterality: N/A;  . ESOPHAGOGASTRODUODENOSCOPY N/A 06/07/2015   Procedure: ESOPHAGOGASTRODUODENOSCOPY (EGD);  Surgeon: Rogene Houston, MD;  [facility];  Service: Endoscopy;  Laterality: N/A;  2:45 - moved to 1:00 - Ann to notify pt  . ESOPHAGOGASTRODUODENOSCOPY (EGD) WITH PROPOFOL N/A 12/31/2012   Procedure: ESOPHAGOGASTRODUODENOSCOPY (EGD) WITH PROPOFOL;  Surgeon: Rogene Houston, MD;  Location: [facility];  Service: Endoscopy;  Laterality: N/A;  GE junction 37,  . ESOPHAGOGASTRODUODENOSCOPY (EGD) WITH PROPOFOL N/A 07/01/2013   Procedure: ESOPHAGOGASTRODUODENOSCOPY (EGD) WITH PROPOFOL;  Surgeon: Rogene Houston, MD;  Location: [facility];  Service: Endoscopy;  Laterality: N/A;  950  . MALONEY DILATION N/A 12/31/2012   Procedure: MALONEY DILATION;  Surgeon: Rogene Houston, MD;  Location: [facility];  Service: Endoscopy;  Laterality: N/A;  used a # 54,#56  . MALONEY DILATION N/A 07/01/2013   Procedure: Venia Minks DILATION;  Surgeon: Rogene Houston, MD;  Location: [facility];  Service: Endoscopy;  Laterality: N/A;  54/58; no heme present  . POLYPECTOMY  02/05/2018   Procedure: POLYPECTOMY;  Surgeon: Rogene Houston, MD;  [facility];  Service: Endoscopy;;  distal rectum (HS)   Past Medical History:  Diagnosis Date  . Anxiety and depression   .  Arthritis   . CAD (coronary artery disease)    Stent placement circumflex coronary 2007, catheterization 2008 patent stents. Normal LV function  . Chest pain   . CHF (congestive heart failure) ([facility])   . COPD (chronic obstructive pulmonary disease) ([facility])    no home O2  . Depression   . Diabetes mellitus    Insulin dependent  . Diabetic polyneuropathy ([facility])     severe on multiple medications  . Dyslipidemia   . GERD (gastroesophageal reflux disease)   . Headache(784.0)   . 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 Hypertension   . MI (myocardial infarction) ([place])    2007  . Obstructive sleep apnea  CPAP, setting ?2  . Palpitations   . Tachycardia    BP (!) 187/74   Pulse (!) 58   Resp 14   SpO2 93%   Opioid Risk Score:   Fall Risk Score:  `1  Depression screen PHQ 2/9  Depression screen [facility] 2/9 06/12/2017 04/14/2017 02/17/2017 01/14/2017 12/24/2016 11/11/2016 10/28/2016  Decreased Interest 3 3 0 3 0 0 2  Down, Depressed, Hopeless 3 3 0 3 0 0 -  PHQ - 2 Score 6 6 0 6 0 0 2  Altered sleeping 3 - - - - - -  Tired, decreased energy 3 - - - - - -  Change in appetite 0 - - - - - -  Feeling bad or failure about yourself  3 - - - - - -  Trouble concentrating 3 - - - - - -  Moving slowly or fidgety/restless 1 - - - - - -  Suicidal thoughts 0 - - - - - -  PHQ-9 Score 19 - - - - - -  Difficult doing work/chores Very difficult - - - - - -  Some recent data might be hidden    Review of Systems  Constitutional: Negative.   HENT: Negative.   Eyes: Negative.   Respiratory: Negative.   Cardiovascular: Negative.   Gastrointestinal: Negative.   Endocrine: Negative.   Genitourinary: Negative.   Musculoskeletal: Positive for gait problem.  Skin: Negative.   Allergic/Immunologic: Negative.   Neurological: Positive for dizziness, weakness and numbness.       Tingling  Hematological: Negative.   Psychiatric/Behavioral: Positive for confusion and dysphoric mood. The patient is nervous/anxious.     All other systems reviewed and are negative.      Objective:   Physical Exam Vitals signs and nursing note reviewed.  Constitutional:      Appearance: Normal appearance.  Neck:     Musculoskeletal: Normal range of motion and neck supple.     Comments: Cervical Paraspinal Tenderness: C-5-C-6 Cardiovascular:     Rate and Rhythm: Normal rate and regular rhythm.     Pulses: Normal pulses.     Heart sounds: Normal heart sounds.  Pulmonary:     Effort: Pulmonary effort is normal.     Breath sounds: Normal breath sounds.  Musculoskeletal:     Comments: Normal Muscle Bulk and Muscle Testing Reveals:  Upper Extremities: Full ROM and Muscle Strength 5/5 Thoracic Paraspinal Tenderness: T-1-T-3 T-7-T-9 Lumbar Hypersensitivity Lower Extremities: Full ROM and Muscle Strength 5/5 Bilateral Greater Trochanter Tenderness Arrived in wheelchair  Skin:    General: Skin is warm and dry.  Neurological:     Mental Status: She is alert and oriented to person, place, and time.  Psychiatric:        Mood and Affect: Mood normal.        Behavior: Behavior normal.           Assessment & Plan:  1.Lumbar pain lumbar spondylosis: Encouraged to continue toincrease Activity as tolerated. 03/03/2018 Continue current medication regimen.Refilled: MS Contin 30 MG one tablet every 8 hours #90. We will continue the opioid monitoring program, this consists of regular clinic visits, examinations, urine drug screen, pill counts as well as use of [place] Controlled Substance Reporting System. 2. Lumbar Radiculitis: Continuecurrent medication regimen withGabapentin.03/03/2018 3. Bilateral Knee Pain/ Degenerative: Continue Voltaren Gel. Continue to Monitor. 03/03/2018. 4. Severe diabetic poly neuropathy: Continuecurrent medication regimen withGabapentin. 03/03/2018 5. Insomnia: ContinueTrazodone PsychiatryFollowing. 03/03/2018 6. Anxiety/ Depression:Lorazepam,ProzacandHydroxyzinePrescribed  byPsychiatry.03/03/2018 7. Tennis Must  Quervains Tenosynovitis: No Complaints Today. 03/03/2018 8. Bilateral Greater Trochanteric Bursitis:Continue with Ice and Heat Therapy. Continue to Monitor. 03/03/2018. 9. Cervicalgia: Continue with HEP as Tolerated and continue to monitor.  10. Chronic Bilateral Thoracic Back Pain: Continue HEP as Tolerated. Continue current medication regimen. Continue to monitor.   20 minutes of face to face patient care time was spent during this visit. All questions were encouraged and answered.  F/U in 1 month

## 2018-03-07 ENCOUNTER — Other Ambulatory Visit: Payer: Self-pay | Admitting: Cardiovascular Disease

## 2018-03-07 ENCOUNTER — Other Ambulatory Visit: Payer: Self-pay | Admitting: "Endocrinology

## 2018-03-08 ENCOUNTER — Ambulatory Visit (INDEPENDENT_AMBULATORY_CARE_PROVIDER_SITE_OTHER): Payer: Medicare Other | Admitting: "Endocrinology

## 2018-03-08 ENCOUNTER — Encounter: Payer: Self-pay | Admitting: "Endocrinology

## 2018-03-08 ENCOUNTER — Ambulatory Visit: Payer: Medicaid Other | Admitting: "Endocrinology

## 2018-03-08 VITALS — BP 180/70 | HR 62 | Resp 14 | Ht 62.0 in | Wt 222.8 lb

## 2018-03-08 DIAGNOSIS — E782 Mixed hyperlipidemia: Secondary | ICD-10-CM | POA: Diagnosis not present

## 2018-03-08 DIAGNOSIS — E1159 Type 2 diabetes mellitus with other circulatory complications: Secondary | ICD-10-CM

## 2018-03-08 DIAGNOSIS — I10 Essential (primary) hypertension: Secondary | ICD-10-CM

## 2018-03-08 MED ORDER — INSULIN GLARGINE 100 UNIT/ML SOLOSTAR PEN: 40.0000 [IU] | PEN_INJECTOR | Freq: Every day | SUBCUTANEOUS | 2 refills | Status: DC

## 2018-03-08 NOTE — Progress Notes (Signed)
Endocrinology follow-up note  Subjective:    Patient ID: [name], female    DOB: [birth year]. Patient is being seen in follow-up for management of currently uncontrolled, complicated type 2 diabetes; hyperlipidemia; hypertension.  Past Medical History:  Diagnosis Date  . Anxiety and depression   . Arthritis   . CAD (coronary artery disease)    Stent placement circumflex coronary 2007, catheterization 2008 patent stents. Normal LV function  . Chest pain   . CHF (congestive heart failure) ([place])   . COPD (chronic obstructive pulmonary disease) (HCC)    no home O2  . Depression   . Diabetes mellitus    Insulin dependent  . Diabetic polyneuropathy ([facility])     severe on multiple medications  . Dyslipidemia   . GERD (gastroesophageal reflux disease)   . Headache(784.0)   . 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 Hypertension   . MI (myocardial infarction) ([place])    2007  . Obstructive sleep apnea    CPAP, setting ?2  . Palpitations   . Tachycardia    Past Surgical History:  Procedure Laterality Date  . ABDOMINAL HYSTERECTOMY    . Arm Surgery Bilateral    due to fall-pt broke both forearms  . BIOPSY N/A 07/01/2013   Procedure: BIOPSY;  Surgeon: Rogene Houston, MD;  Location: [facility];  Service: Endoscopy;  Laterality: N/A;  . CHOLECYSTECTOMY    . COLONOSCOPY  08/06/2011   Procedure: COLONOSCOPY;  Surgeon: Rogene Houston, MD;  Location: [facility];  Service: Endoscopy;  Laterality: N/A;  215  . COLONOSCOPY WITH PROPOFOL N/A 12/31/2012   Procedure: COLONOSCOPY WITH PROPOFOL;  Surgeon: Rogene Houston, MD;  Location: [facility];  Service: Endoscopy;  Laterality: N/A;  in cecum at 0814, total withdrawal time 25min  . COLONOSCOPY WITH PROPOFOL N/A 02/05/2018   Procedure: COLONOSCOPY WITH PROPOFOL;  Surgeon: Rogene Houston, MD;  [facility]: [facility];  Service: Endoscopy;  Laterality: N/A;  1:25  . CORONARY ANGIOPLASTY WITH STENT PLACEMENT    . ESOPHAGEAL DILATION N/A 06/07/2015   Procedure:  ESOPHAGEAL DILATION;  Surgeon: Rogene Houston, MD;  [facility];  Service: Endoscopy;  Laterality: N/A;  . ESOPHAGOGASTRODUODENOSCOPY N/A 06/07/2015   Procedure: ESOPHAGOGASTRODUODENOSCOPY (EGD);  Surgeon: Rogene Houston, MD;  [facility];  Service: Endoscopy;  Laterality: N/A;  2:45 - moved to 1:00 - Ann to notify pt  . ESOPHAGOGASTRODUODENOSCOPY (EGD) WITH PROPOFOL N/A 12/31/2012   Procedure: ESOPHAGOGASTRODUODENOSCOPY (EGD) WITH PROPOFOL;  Surgeon: Rogene Houston, MD;  Location: [facility];  Service: Endoscopy;  Laterality: N/A;  GE junction 37,  . ESOPHAGOGASTRODUODENOSCOPY (EGD) WITH PROPOFOL N/A 07/01/2013   Procedure: ESOPHAGOGASTRODUODENOSCOPY (EGD) WITH PROPOFOL;  Surgeon: Rogene Houston, MD;  Location: [facility];  Service: Endoscopy;  Laterality: N/A;  950  . MALONEY DILATION N/A 12/31/2012   Procedure: MALONEY DILATION;  Surgeon: Rogene Houston, MD;  Location: [facility];  Service: Endoscopy;  Laterality: N/A;  used a # 54,#56  . MALONEY DILATION N/A 07/01/2013   Procedure: Venia Minks DILATION;  Surgeon: Rogene Houston, MD;  Location: [facility];  Service: Endoscopy;  Laterality: N/A;  54/58; no heme present  . POLYPECTOMY  02/05/2018   Procedure: POLYPECTOMY;  Surgeon: Rogene Houston, MD;  [facility];  Service: Endoscopy;;  distal rectum (HS)   Social History   Socioeconomic History  . Marital status: Legally Separated    Spouse name: Not on file  . Number of children:  4  . Years of education: Not on file  . Highest education level: Not on file  Occupational History  . Occupation: Disabled  Social Needs  . Financial resource strain: Not on file  . Food insecurity:    Worry: Not on file    Inability: Not on file  . Transportation needs:    Medical: Not on file    Non-medical: Not on file  Tobacco Use  . Smoking status: Never Smoker  . Smokeless tobacco: Never Used  Substance and Sexual Activity  . Alcohol use: No    Alcohol/week: 0.0 standard  drinks  . Drug use: No  . Sexual activity: Not on file  Lifestyle  . Physical activity:    Days per week: Not on file    Minutes per session: Not on file  . Stress: Not on file  Relationships  . Social connections:    Talks on phone: Not on file    Gets together: Not on file    Attends religious service: Not on file    Active member of club or organization: Not on file    Attends meetings of clubs or organizations: Not on file    Relationship status: Not on file  Other Topics Concern  . Not on file  Social History Narrative   Patient does not get regular exercise   Denies caffeine use    Outpatient Encounter Medications as of 03/08/2018  Medication Sig  . aspirin EC 81 MG tablet Take 1 tablet (81 mg total) by mouth daily.  Marland Kitchen atorvastatin (LIPITOR) 40 MG tablet Take 40 mg by mouth daily.  Marland Kitchen buPROPion (WELLBUTRIN XL) 150 MG 24 hr tablet Take 150-300 mg by mouth See admin instructions. Take 300 mg by mouth in the morning and take 150 mg by mouth at noon  . diclofenac sodium (VOLTAREN) 1 % GEL APPLY TO BOTH KNEES THREE TIMES DAILY (Patient taking differently: Apply 1 application topically at bedtime. )  . estrogens, conjugated, (PREMARIN) 1.25 MG tablet Take 1.25 mg by mouth daily.   Marland Kitchen FLUoxetine (PROZAC) 20 MG capsule Take 20 mg by mouth 2 (two) times daily.   . furosemide (LASIX) 20 MG tablet Take 20 mg by mouth daily as needed for fluid.   Marland Kitchen gabapentin (NEURONTIN) 400 MG capsule Take 400 mg by mouth 3 (three) times daily.  . hydrOXYzine (ATARAX/VISTARIL) 10 MG tablet Take 10 mg by mouth 4 (four) times daily as needed for anxiety.  . Insulin Glargine (LANTUS SOLOSTAR) 100 UNIT/ML Solostar Pen Inject 40 Units into the skin at bedtime.  . lidocaine (XYLOCAINE) 5 % ointment Apply 1 application topically at bedtime.   Marland Kitchen LORazepam (ATIVAN) 0.5 MG tablet Take 0.5 mg by mouth 2 (two) times daily as needed for anxiety.   . meloxicam (MOBIC) 7.5 MG tablet Take 1 tablet (7.5 mg total) by  mouth daily.  . metFORMIN (GLUCOPHAGE) 500 MG tablet Take 1 tablet (500 mg total) by mouth 2 (two) times daily.  . methocarbamol (ROBAXIN) 500 MG tablet Take 1 tablet (500 mg total) by mouth 3 (three) times daily. (Patient taking differently: Take 500 mg by mouth 3 (three) times daily as needed for muscle spasms. )  . metoCLOPramide (REGLAN) 5 MG tablet Take 5 mg by mouth 3 (three) times daily as needed for nausea or vomiting.   . metoprolol succinate (TOPROL-XL) 50 MG 24 hr tablet TAKE 1 AND 1/2 TABLETS BY MOUTH EVERY DAY  . montelukast (SINGULAIR) 10 MG tablet Take 10 mg  by mouth daily.  Marland Kitchen morphine (MS CONTIN) 30 MG 12 hr tablet Take 1 tablet (30 mg total) by mouth every 8 (eight) hours.  . nitroGLYCERIN (NITROSTAT) 0.4 MG SL tablet Place 1 tablet (0.4 mg total) under the tongue every 5 (five) minutes x 3 doses as needed. For chest pains  . ondansetron (ZOFRAN) 4 MG tablet Take 4 mg by mouth every 6 (six) hours as needed for nausea or vomiting.  . pantoprazole (PROTONIX) 40 MG tablet TAKE 1 TABLET BY MOUTH TWICE DAILY BEFORE MEALS (Patient taking differently: Take 40 mg by mouth 2 (two) times daily. )  . potassium chloride (K-DUR,KLOR-CON) 10 MEQ tablet Take 10 mEq by mouth 2 (two) times daily.  Marland Kitchen PROAIR HFA 108 (90 Base) MCG/ACT inhaler Inhale 2 puffs into the lungs every 4 (four) hours as needed for wheezing or shortness of breath.   . ranitidine (ZANTAC) 300 MG tablet Take 1 tablet (300 mg total) by mouth at bedtime as needed for heartburn. (Patient taking differently: Take 300 mg by mouth 2 (two) times daily. )  . sucralfate (CARAFATE) 1 g tablet Take 1 tablet (1 g total) by mouth 4 (four) times daily -  with meals and at bedtime.  . topiramate (TOPAMAX) 200 MG tablet Take 200 mg by mouth at bedtime.  Marland Kitchen zolpidem (AMBIEN) 10 MG tablet Take 10 mg by mouth at bedtime.  . [DISCONTINUED] LANTUS SOLOSTAR 100 UNIT/ML Solostar Pen INJECT 70 UNITS INTO SKIN AT BEDTIME  . lidocaine-hydrocortisone  (ANAMANTEL HC) 3-0.5 % CREA Place 1 Applicatorful rectally 2 (two) times daily for 7 days.  . [DISCONTINUED] diltiazem (TIAZAC) 180 MG 24 hr capsule Take 1 capsule (180 mg total) by mouth daily.  . [DISCONTINUED] potassium chloride (K-DUR) 10 MEQ tablet Take 2 tablets (20 mEq total) by mouth 2 (two) times daily.   No facility-administered encounter medications on file as of 03/08/2018.    ALLERGIES: Allergies  Allergen Reactions  . Nitrofuran Derivatives Itching and Swelling  . Amphetamine-Dextroamphet Er Swelling  . Pregabalin Swelling  . Topiramate Other (See Comments)    Tongue tingle  . Verelan [Verapamil] Rash   VACCINATION STATUS:  There is no immunization history on file for this patient.  Diabetes  She presents for her follow-up diabetic visit. She has type 2 diabetes mellitus. Onset time: She was diagnosed at approximate age of 45 years. Her disease course has been improving. There are no hypoglycemic associated symptoms. Pertinent negatives for hypoglycemia include no confusion, headaches, pallor or seizures. Pertinent negatives for diabetes include no chest pain, no fatigue, no polydipsia, no polyphagia and no polyuria. There are no hypoglycemic complications. Symptoms are improving. Diabetic complications include autonomic neuropathy, heart disease, peripheral neuropathy, PVD and retinopathy. Risk factors for coronary artery disease include dyslipidemia, diabetes mellitus, obesity, hypertension and sedentary lifestyle. Current diabetic treatments: Lantus 65 units daily at bedtime, Humalog 15 units 3 times a day before meals. She mentions allergy to Victoza, Byetta, and metformin. Her weight is increasing steadily. She is following a generally unhealthy diet. When asked about meal planning, she reported none. She has not had a previous visit with a dietitian. She never participates in exercise. Her home blood glucose trend is increasing steadily. Her breakfast blood glucose range is  generally 140-180 mg/dl. Her overall blood glucose range is 180-200 mg/dl. An ACE inhibitor/angiotensin II receptor blocker is not being taken. Eye exam is current.  Hyperlipidemia  This is a chronic problem. The current episode started more than 1 year ago.  The problem is uncontrolled. Recent lipid tests were reviewed and are high. Exacerbating diseases include diabetes and obesity. Pertinent negatives include no chest pain, myalgias or shortness of breath. Current antihyperlipidemic treatment includes statins. Risk factors for coronary artery disease include diabetes mellitus, dyslipidemia, hypertension, obesity and a sedentary lifestyle.  Hypertension  This is a chronic problem. The current episode started more than 1 year ago. Pertinent negatives include no chest pain, headaches, palpitations or shortness of breath. Risk factors for coronary artery disease include diabetes mellitus, dyslipidemia, obesity and sedentary lifestyle. Hypertensive end-organ damage includes CAD/MI, PVD and retinopathy.    Review of Systems  Constitutional: Negative for chills, fatigue, fever and unexpected weight change.  HENT: Negative for trouble swallowing and voice change.   Eyes: Negative for visual disturbance.  Respiratory: Negative for cough, chest tightness, shortness of breath and wheezing.   Cardiovascular: Negative for chest pain, palpitations and leg swelling.  Gastrointestinal: Negative for diarrhea, nausea and vomiting.  Endocrine: Negative for cold intolerance, heat intolerance, polydipsia, polyphagia and polyuria.  Musculoskeletal: Negative for arthralgias and myalgias.  Skin: Negative for color change, pallor, rash and wound.  Neurological: Negative for seizures and headaches.  Psychiatric/Behavioral: Negative for confusion and suicidal ideas.    Objective:    BP (!) 180/70 (BP Location: Right Arm, Patient Position: Sitting, Cuff Size: Large)   Pulse 62   Resp 14   Ht 5\' 2"  (1.575 m)   Wt  222 lb 12.8 oz (101.1 kg)   SpO2 94% Comment: room air  BMI 40.75 kg/m   Wt Readings from Last 3 Encounters:  03/08/18 222 lb 12.8 oz (101.1 kg)  02/09/18 220 lb (99.8 kg)  01/12/18 210 lb (95.3 kg)    Physical Exam  Constitutional: She is oriented to person, place, and time. She appears well-developed.  Wheelchair-bound due to deconditioning and disequilibrium.  HENT:  Head: Normocephalic and atraumatic.  She has very poor dentition.  Eyes: EOM are normal.  Neck: Normal range of motion. Neck supple. No tracheal deviation present. No thyromegaly present.  Cardiovascular: Normal rate.  No reproducible chest wall tenderness.  Pulmonary/Chest: Effort normal.  Abdominal: There is no abdominal tenderness. There is no guarding.  Musculoskeletal: Normal range of motion.        General: No edema.     Comments: Diabetes foot exam is abnormal with calluses, diminished monofilament sensation, dry skin, diminished to absent peripheral pulses, and long nails.  She follows with podiatry for nail clipping.  Neurological: She is alert and oriented to person, place, and time. No cranial nerve deficit. Coordination normal.  Diminished monofilament test sensation on bilateral lower extremities.  Skin: Skin is warm and dry. No rash noted. No erythema. No pallor.  Psychiatric: She has a normal mood and affect. Judgment normal.    CMP     Component Value Date/Time   NA 141 01/27/2018 1452   NA 143 11/06/2016 1112   K 4.9 01/27/2018 1452   CL 106 01/27/2018 1452   CO2 30 01/27/2018 1452   GLUCOSE 163 (H) 01/27/2018 1452   BUN 13 01/27/2018 1452   BUN 11 11/06/2016 1112   CREATININE 0.62 01/27/2018 1452   CALCIUM 9.0 01/27/2018 1452   PROT 5.6 (L) 01/27/2018 1452   PROT 5.5 (L) 11/06/2016 1112   ALBUMIN 2.9 (L) 12/07/2017 1413   ALBUMIN 3.4 (L) 11/06/2016 1112   AST 8 (L) 01/27/2018 1452   ALT 7 01/27/2018 1452   ALKPHOS 65 12/07/2017 1413   BILITOT 0.4  01/27/2018 1452   BILITOT 0.3  11/06/2016 1112   GFRNONAA 95 01/27/2018 1452   GFRAA 110 01/27/2018 1452   Diabetic Labs (most recent): Lab Results  Component Value Date   HGBA1C 7.1 (H) 01/27/2018   HGBA1C 8.3 (H) 09/30/2017   HGBA1C 8.2 (H) 05/11/2017    Lipid Panel     Component Value Date/Time   CHOL 151 02/18/2017 0528   CHOL 184 04/15/2016 1121   TRIG 439 (H) 02/18/2017 0528   HDL 46 02/18/2017 0528   HDL 77 04/15/2016 1121   CHOLHDL 3.3 02/18/2017 0528   VLDL UNABLE TO CALCULATE IF TRIGLYCERIDE OVER 400 mg/dL 02/18/2017 0528   LDLCALC UNABLE TO CALCULATE IF TRIGLYCERIDE OVER 400 mg/dL 02/18/2017 0528   LDLCALC 50 04/15/2016 1121     Assessment & Plan:   1. Type 2 diabetes mellitus with vascular disease ([place]) - Patient has currently uncontrolled symptomatic type 2 DM since  67 years of age. - She came with improved glucose profile, A1c of improving 7.1% .   Recent labs reviewed.  - Her diabetes is complicated by obesity/sedentary life, coronary artery disease status post stent placement and patient remains at a high risk for more acute and chronic complications of diabetes which include CAD, CVA, CKD, retinopathy, and neuropathy. These are all discussed in detail with the patient.  - I have counseled the patient on diet management and weight loss, by adopting a carbohydrate restricted/protein rich diet.  -  Suggestion is made for her to avoid simple carbohydrates  from her diet including Cakes, Sweet Desserts / Pastries, Ice Cream, Soda (diet and regular), Sweet Tea, Candies, Chips, Cookies, Store Bought Juices, Alcohol in Excess of  1-2 drinks a day, Artificial Sweeteners, and "Sugar-free" Products. This will help patient to have stable blood glucose profile and potentially avoid unintended weight gain.   - I encouraged the patient to switch to  unprocessed or minimally processed complex starch and increased protein intake (animal or plant source), fruits, and vegetables.  - Patient is advised to  stick to a routine mealtimes to eat 3 meals  a day and avoid unnecessary snacks ( to snack only to correct hypoglycemia).   - I have approached patient with the following individualized plan to manage diabetes and patient agrees:   - She has normal renal function. -She presents with tight control of fasting blood glucose profile despite down titrating her insulin.  I advised her to lower her Lantus to 40 units nightly associated with monitoring of blood glucose at least twice daily before breakfast and anytime as needed.  - Patient is warned not to take insulin without proper monitoring per orders. -Adjustment parameters are given for hypo and hyperglycemia in writing. -Patient is encouraged to call clinic for blood glucose levels less than 70 or above 200 mg /dl. -She has intolerance to GLP-1 receptor agonists. -She is advised to  continue on metformin 500 mg p.o. twice daily.    - Patient specific target  A1c;  LDL, HDL, Triglycerides, and  Waist Circumference were discussed in detail.  2) BP/HTN: Her blood pressure is controlled to target.   She has adequate medications, advised to continue including metoprolol XL 50 mg p.o. daily.Marland Kitchen   3) Lipids/HPL: She has uncontrolled hypertriglyceridemia , still high at 439  LDL better at 50, HDL at 77. Advised her to continue  Lipitor 40 mg by mouth daily at bedtime. Better control of diabetes will help for triglycerides.  4)  Weight/Diet: CDE Consult  in progress  , exercise, and detailed carbohydrates information provided.  5) Chronic Care/Health Maintenance:  -Patient is on Statin medications and encouraged to continue to follow up with Ophthalmology, Podiatrist at least yearly or according to recommendations, and advised to   stay away from smoking. I have recommended yearly flu vaccine and pneumonia vaccination at least every 5 years;  and  sleep for at least 7 hours a day. -She will benefit from a set of diabetic shoes, paperwork filled for  her.  - I advised patient to maintain close follow up with Glenda Chroman, MD for primary care needs.  - Time spent with the patient: 25 min, of which >50% was spent in reviewing her blood glucose logs , discussing her hypo- and hyper-glycemic episodes, reviewing her current and  previous labs and insulin doses and developing a plan to avoid hypo- and hyper-glycemia. Please refer to Patient Instructions for Blood Glucose Monitoring and Insulin/Medications Dosing Guide"  in media tab for additional information. [name] participated in the discussions, expressed understanding, and voiced agreement with the above plans.  All questions were answered to her satisfaction. she is encouraged to contact clinic should she have any questions or concerns prior to her return visit.   Follow up plan: - Patient to go to emergency room for better evaluation of chest pain. Return in about 4 months (around 07/08/2018) for Meter, and Logs.  Glade Lloyd, MD Phone: [phone]  Fax: [phone]  This note was partially dictated with voice recognition software. Similar sounding words can be transcribed inadequately or may not  be corrected upon review.  03/08/2018, 5:07 PM

## 2018-03-08 NOTE — Patient Instructions (Signed)

## 2018-03-23 DIAGNOSIS — F332 Major depressive disorder, recurrent severe without psychotic features: Secondary | ICD-10-CM | POA: Diagnosis not present

## 2018-03-30 DIAGNOSIS — J310 Chronic rhinitis: Secondary | ICD-10-CM | POA: Diagnosis not present

## 2018-03-30 DIAGNOSIS — E1165 Type 2 diabetes mellitus with hyperglycemia: Secondary | ICD-10-CM | POA: Diagnosis not present

## 2018-03-30 DIAGNOSIS — E1142 Type 2 diabetes mellitus with diabetic polyneuropathy: Secondary | ICD-10-CM | POA: Diagnosis not present

## 2018-03-30 DIAGNOSIS — Z789 Other specified health status: Secondary | ICD-10-CM | POA: Diagnosis not present

## 2018-03-30 DIAGNOSIS — Z6837 Body mass index (BMI) 37.0-37.9, adult: Secondary | ICD-10-CM | POA: Diagnosis not present

## 2018-03-30 DIAGNOSIS — Z299 Encounter for prophylactic measures, unspecified: Secondary | ICD-10-CM | POA: Diagnosis not present

## 2018-03-30 DIAGNOSIS — I10 Essential (primary) hypertension: Secondary | ICD-10-CM | POA: Diagnosis not present

## 2018-04-05 ENCOUNTER — Ambulatory Visit: Payer: Medicare Other | Admitting: Cardiovascular Disease

## 2018-04-07 ENCOUNTER — Encounter: Payer: Medicare Other | Admitting: Registered Nurse

## 2018-04-11 ENCOUNTER — Other Ambulatory Visit: Payer: Self-pay | Admitting: Registered Nurse

## 2018-04-12 ENCOUNTER — Encounter: Payer: Medicare Other | Attending: Registered Nurse | Admitting: Registered Nurse

## 2018-04-12 ENCOUNTER — Encounter: Payer: Self-pay | Admitting: Registered Nurse

## 2018-04-12 VITALS — BP 153/72 | HR 67 | Ht 62.0 in | Wt 215.0 lb

## 2018-04-12 DIAGNOSIS — M1711 Unilateral primary osteoarthritis, right knee: Secondary | ICD-10-CM

## 2018-04-12 DIAGNOSIS — M25561 Pain in right knee: Secondary | ICD-10-CM | POA: Diagnosis not present

## 2018-04-12 DIAGNOSIS — M1712 Unilateral primary osteoarthritis, left knee: Secondary | ICD-10-CM

## 2018-04-12 DIAGNOSIS — I2510 Atherosclerotic heart disease of native coronary artery without angina pectoris: Secondary | ICD-10-CM | POA: Diagnosis not present

## 2018-04-12 DIAGNOSIS — M5412 Radiculopathy, cervical region: Secondary | ICD-10-CM | POA: Diagnosis not present

## 2018-04-12 DIAGNOSIS — E785 Hyperlipidemia, unspecified: Secondary | ICD-10-CM | POA: Diagnosis not present

## 2018-04-12 DIAGNOSIS — Z955 Presence of coronary angioplasty implant and graft: Secondary | ICD-10-CM | POA: Insufficient documentation

## 2018-04-12 DIAGNOSIS — M7061 Trochanteric bursitis, right hip: Secondary | ICD-10-CM | POA: Insufficient documentation

## 2018-04-12 DIAGNOSIS — Z79899 Other long term (current) drug therapy: Secondary | ICD-10-CM

## 2018-04-12 DIAGNOSIS — M542 Cervicalgia: Secondary | ICD-10-CM | POA: Insufficient documentation

## 2018-04-12 DIAGNOSIS — F419 Anxiety disorder, unspecified: Secondary | ICD-10-CM | POA: Diagnosis not present

## 2018-04-12 DIAGNOSIS — I509 Heart failure, unspecified: Secondary | ICD-10-CM | POA: Insufficient documentation

## 2018-04-12 DIAGNOSIS — K219 Gastro-esophageal reflux disease without esophagitis: Secondary | ICD-10-CM | POA: Insufficient documentation

## 2018-04-12 DIAGNOSIS — Z823 Family history of stroke: Secondary | ICD-10-CM | POA: Diagnosis not present

## 2018-04-12 DIAGNOSIS — I110 Hypertensive heart disease with heart failure: Secondary | ICD-10-CM | POA: Diagnosis not present

## 2018-04-12 DIAGNOSIS — M48062 Spinal stenosis, lumbar region with neurogenic claudication: Secondary | ICD-10-CM

## 2018-04-12 DIAGNOSIS — F411 Generalized anxiety disorder: Secondary | ICD-10-CM | POA: Diagnosis not present

## 2018-04-12 DIAGNOSIS — M7062 Trochanteric bursitis, left hip: Secondary | ICD-10-CM | POA: Insufficient documentation

## 2018-04-12 DIAGNOSIS — M545 Low back pain: Secondary | ICD-10-CM | POA: Insufficient documentation

## 2018-04-12 DIAGNOSIS — F329 Major depressive disorder, single episode, unspecified: Secondary | ICD-10-CM | POA: Insufficient documentation

## 2018-04-12 DIAGNOSIS — G4700 Insomnia, unspecified: Secondary | ICD-10-CM | POA: Insufficient documentation

## 2018-04-12 DIAGNOSIS — G8929 Other chronic pain: Secondary | ICD-10-CM | POA: Diagnosis not present

## 2018-04-12 DIAGNOSIS — E1142 Type 2 diabetes mellitus with diabetic polyneuropathy: Secondary | ICD-10-CM | POA: Diagnosis not present

## 2018-04-12 DIAGNOSIS — M25562 Pain in left knee: Secondary | ICD-10-CM | POA: Insufficient documentation

## 2018-04-12 DIAGNOSIS — G4733 Obstructive sleep apnea (adult) (pediatric): Secondary | ICD-10-CM | POA: Diagnosis not present

## 2018-04-12 DIAGNOSIS — M5416 Radiculopathy, lumbar region: Secondary | ICD-10-CM | POA: Diagnosis not present

## 2018-04-12 DIAGNOSIS — J449 Chronic obstructive pulmonary disease, unspecified: Secondary | ICD-10-CM | POA: Insufficient documentation

## 2018-04-12 DIAGNOSIS — M4726 Other spondylosis with radiculopathy, lumbar region: Secondary | ICD-10-CM | POA: Diagnosis not present

## 2018-04-12 DIAGNOSIS — Z8249 Family history of ischemic heart disease and other diseases of the circulatory system: Secondary | ICD-10-CM | POA: Diagnosis not present

## 2018-04-12 DIAGNOSIS — Z5181 Encounter for therapeutic drug level monitoring: Secondary | ICD-10-CM

## 2018-04-12 DIAGNOSIS — I252 Old myocardial infarction: Secondary | ICD-10-CM | POA: Insufficient documentation

## 2018-04-12 DIAGNOSIS — M546 Pain in thoracic spine: Secondary | ICD-10-CM | POA: Diagnosis not present

## 2018-04-12 MED ORDER — MORPHINE SULFATE ER 30 MG PO TBCR: 30.0000 mg | EXTENDED_RELEASE_TABLET | Freq: Three times a day (TID) | ORAL | 0 refills | Status: DC

## 2018-04-12 NOTE — Progress Notes (Signed)
Subjective:    Patient ID: [name], female    DOB: April 23, 1950, 68 y.o.   MRN: [id number]  HPI: [name] is a 67 y.o. female who returns for follow up appointment for chronic pain and medication refill. She states her pain is located in her neck radiating into her head ( occipital region) and bilateral shoulder pain, mid- lower back pain radiating into her bilateral lower extremities. She rates her pain 9.Her current exercise regime is walking.  [name] Morphine equivalent is 90.00  MME. She is also prescribed Lorazepam by Noemi Chapel. We have discussed the black box warning of using opioids and benzodiazepines. I highlighted the dangers of using these drugs together and discussed the adverse events including respiratory suppression, overdose, cognitive impairment and importance of compliance with current regimen. We will continue to monitor and adjust as indicated.  She  is being closely monitored and under the care of her psychiatrist Dr. Casimiro Needle.   Last UDS was Performed on 09/02/2017, it was consistent.    Pain Inventory Average Pain 7 Pain Right Now 9 My pain is sharp, burning, dull, stabbing, tingling and aching  In the last 24 hours, has pain interfered with the following? General activity 7 Relation with others 4 Enjoyment of life 2 What TIME of day is your pain at its worst? morning, night Sleep (in general) Poor  Pain is worse with: walking, bending, sitting, inactivity and standing Pain improves with: medication Relief from Meds: 5  Mobility walk with assistance use a walker how many minutes can you walk? 5 ability to climb steps?  no do you drive?  yes use a wheelchair needs help with transfers Do you have any goals in this area?  no  Function disabled: date disabled . I need assistance with the following:  dressing, meal prep, household duties and shopping Do you have any goals in this area?  no  Neuro/Psych bladder control  problems weakness numbness tremor tingling trouble walking spasms dizziness confusion depression anxiety  Prior Studies Any changes since last visit?  no  Physicians involved in your care Any changes since last visit?  no   Family History  Problem Relation Age of Onset  . Heart attack Mother   . Colon cancer Mother   . Heart attack Father   . Stroke Sister   . Hemophilia Child   . Cancer Other   . Coronary artery disease Other   . Cancer Brother   . Cancer Maternal Aunt   . Cancer Maternal Uncle   . Cancer Paternal Aunt   . Cancer Paternal Uncle   . Cancer Brother    Social History   Socioeconomic History  . Marital status: Legally Separated    Spouse name: Not on file  . Number of children: 4  . Years of education: Not on file  . Highest education level: Not on file  Occupational History  . Occupation: Disabled  Social Needs  . Financial resource strain: Not on file  . Food insecurity:    Worry: Not on file    Inability: Not on file  . Transportation needs:    Medical: Not on file    Non-medical: Not on file  Tobacco Use  . Smoking status: Never Smoker  . Smokeless tobacco: Never Used  Substance and Sexual Activity  . Alcohol use: No    Alcohol/week: 0.0 standard drinks  . Drug use: No  . Sexual activity: Not on file  Lifestyle  . Physical  activity:    Days per week: Not on file    Minutes per session: Not on file  . Stress: Not on file  Relationships  . Social connections:    Talks on phone: Not on file    Gets together: Not on file    Attends religious service: Not on file    Active member of club or organization: Not on file    Attends meetings of clubs or organizations: Not on file    Relationship status: Not on file  Other Topics Concern  . Not on file  Social History Narrative   Patient does not get regular exercise   Denies caffeine use    Past Surgical History:  Procedure Laterality Date  . ABDOMINAL HYSTERECTOMY    . Arm  Surgery Bilateral    due to fall-pt broke both forearms  . BIOPSY N/A 07/01/2013   Procedure: BIOPSY;  Surgeon: Rogene Houston, MD;  [facility];  Service: Endoscopy;  Laterality: N/A;  . CHOLECYSTECTOMY    . COLONOSCOPY  08/06/2011   Procedure: COLONOSCOPY;  Surgeon: Rogene Houston, MD;  [facility];  Service: Endoscopy;  Laterality: N/A;  215  . COLONOSCOPY WITH PROPOFOL N/A 12/31/2012   Procedure: COLONOSCOPY WITH PROPOFOL;  Surgeon: Rogene Houston, MD;  Location: [facility];  Service: Endoscopy;  Laterality: N/A;  in cecum at 0814, total withdrawal time 34min  . COLONOSCOPY WITH PROPOFOL N/A 02/05/2018   Procedure: COLONOSCOPY WITH PROPOFOL;  Surgeon: Rogene Houston, MD;  [facility];  Service: Endoscopy;  Laterality: N/A;  1:25  . CORONARY ANGIOPLASTY WITH STENT PLACEMENT    . ESOPHAGEAL DILATION N/A 06/07/2015   Procedure: ESOPHAGEAL DILATION;  Surgeon: Rogene Houston, MD;  [facility];  Service: Endoscopy;  Laterality: N/A;  . ESOPHAGOGASTRODUODENOSCOPY N/A 06/07/2015   Procedure: ESOPHAGOGASTRODUODENOSCOPY (EGD);  Surgeon: Rogene Houston, MD;  [facility];  Service: Endoscopy;  Laterality: N/A;  2:45 - moved to 1:00 - Ann to notify pt  . ESOPHAGOGASTRODUODENOSCOPY (EGD) WITH PROPOFOL N/A 12/31/2012   Procedure: ESOPHAGOGASTRODUODENOSCOPY (EGD) WITH PROPOFOL;  Surgeon: Rogene Houston, MD;  [facility];  Service: Endoscopy;  Laterality: N/A;  GE junction 37,  . ESOPHAGOGASTRODUODENOSCOPY (EGD) WITH PROPOFOL N/A 07/01/2013   Procedure: ESOPHAGOGASTRODUODENOSCOPY (EGD) WITH PROPOFOL;  Surgeon: Rogene Houston, MD;  [facility];  Service: Endoscopy;  Laterality: N/A;  950  . MALONEY DILATION N/A 12/31/2012   Procedure: MALONEY DILATION;  Surgeon: Rogene Houston, MD;  [facility];  Service: Endoscopy;  Laterality: N/A;  used a # 54,#56  . MALONEY DILATION N/A 07/01/2013   Procedure: Venia Minks DILATION;  Surgeon: Rogene Houston, MD;  [facility];  Service: Endoscopy;  Laterality: N/A;  54/58; no heme present  . POLYPECTOMY  02/05/2018   Procedure: POLYPECTOMY;  Surgeon: Rogene Houston, MD;  [facility];  Service: Endoscopy;;  distal rectum (HS)   Past Medical History:  Diagnosis Date  . Anxiety and depression   . Arthritis   . CAD (coronary artery disease)    Stent placement circumflex coronary 2007, catheterization 2008 patent stents. Normal LV function  . Chest pain   . CHF (congestive heart failure) ([place])   . COPD (chronic obstructive pulmonary disease) ([facility])    no home O2  . Depression   . Diabetes mellitus    Insulin dependent  . Diabetic polyneuropathy ([facility])     severe on multiple  medications  . Dyslipidemia   . GERD (gastroesophageal reflux disease)   . Headache(784.0)   . 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 Hypertension   . MI (myocardial infarction) ([place])    2007  . Obstructive sleep apnea    CPAP, setting ?2  . Palpitations   . Tachycardia    BP (!) 153/72   Pulse 67   Ht 5\' 2"  (1.575 m)   Wt 215 lb (97.5 kg)   SpO2 95%   BMI 39.32 kg/m   Opioid Risk Score:   Fall Risk Score:  `1  Depression screen PHQ 2/9  Depression screen [facility] 2/9 06/12/2017 04/14/2017 02/17/2017 01/14/2017 12/24/2016 11/11/2016 10/28/2016  Decreased Interest 3 3 0 3 0 0 2  Down, Depressed, Hopeless 3 3 0 3 0 0 -  PHQ - 2 Score 6 6 0 6 0 0 2  Altered sleeping 3 - - - - - -  Tired, decreased energy 3 - - - - - -  Change in appetite 0 - - - - - -  Feeling bad or failure about yourself  3 - - - - - -  Trouble concentrating 3 - - - - - -  Moving slowly or fidgety/restless 1 - - - - - -  Suicidal thoughts 0 - - - - - -  PHQ-9 Score 19 - - - - - -  Difficult doing work/chores Very difficult - - - - - -  Some recent data might be hidden    Review of Systems  Constitutional: Positive for diaphoresis.  HENT: Negative.   Eyes: Negative.   Respiratory: Positive for cough, shortness of breath and  wheezing.   Cardiovascular: Negative.   Gastrointestinal: Positive for constipation, nausea and vomiting.  Endocrine: Negative.   Genitourinary: Positive for difficulty urinating, dysuria, frequency and urgency.       Incontinence   Musculoskeletal: Positive for arthralgias, back pain, gait problem, myalgias, neck pain and neck stiffness.       Spasms   Skin: Negative.   Allergic/Immunologic: Negative.   Neurological: Positive for dizziness, tremors, weakness and numbness.       Tingling  Hematological: Negative.   Psychiatric/Behavioral: Positive for confusion and dysphoric mood. The patient is nervous/anxious.   All other systems reviewed and are negative.      Objective:   Physical Exam Vitals signs and nursing note reviewed.  Constitutional:      Appearance: Normal appearance.  Neck:     Musculoskeletal: Normal range of motion and neck supple.  Cardiovascular:     Rate and Rhythm: Normal rate and regular rhythm.     Pulses: Normal pulses.     Heart sounds: Normal heart sounds.  Pulmonary:     Effort: Pulmonary effort is normal.     Breath sounds: Normal breath sounds.  Musculoskeletal:     Comments: Normal Muscle Bulk and Muscle Testing Reveals:  Upper Extremities: Full ROM and Muscle Strength 5/5 Thoracic and Lumbar Hypersensitivity Bilateral Greater Trochanter Tenderness Lower Extremities: Decreased ROM and Muscle Strength 4/5 Bilateral Lower Extremities Flexion Produces pain into Lumbar and Bilateral Hips Arises from Table Slowly using walker for support Narrow Based Gait   Skin:    General: Skin is warm and dry.  Neurological:     Mental Status: She is alert and oriented to person, place, and time.  Psychiatric:        Mood and Affect: Mood normal.        Behavior: Behavior normal.  Assessment & Plan:  1.Lumbar pain lumbar spondylosis: Encouraged to continue toincrease Activity as tolerated. 04/12/2018. Continue current medication  regimen.Refilled: MS Contin 30 MG one tablet every 8 hours #90. We will continue the opioid monitoring program, this consists of regular clinic visits, examinations, urine drug screen, pill counts as well as use of [place] Controlled Substance Reporting System. 2. Lumbar Radiculitis: Continuecurrent medication regimen withGabapentin.04/12/2018 3. Bilateral Knee Pain/ Degenerative: Continue Voltaren Gel. Continue to Monitor. 04/12/2018 4. Severe diabetic poly neuropathy: Continuecurrent medication regimen withGabapentin. 04/12/2018 5. Insomnia: ContinueTrazodone PsychiatryFollowing. 04/12/2018 6.Anxiety/Depression:Lorazepam,ProzacandHydroxyzinePrescribed byPsychiatry.04/12/2018 7. De Quervains Tenosynovitis: No Complaints Today. 04/12/2018 8. Bilateral Greater Trochanteric Bursitis:Continue with Ice and Heat Therapy. Continue to Monitor. 04/12/2018. 9. Cervicalgia/ Cervical Radiculitis: Continue with HEP as Tolerated and continue to monitor. 04/12/2018 10. Chronic Bilateral Thoracic Back Pain: Continue HEP as Tolerated. Continue current medication regimen. Continue to monitor. 04/12/2018.  37minutes of face to face patient care time was spent during this visit. All questions were encouraged and answered.  F/U in 1 month

## 2018-04-12 NOTE — Telephone Encounter (Signed)
Recieved electronic medication refill request for voltaren gel 1%.  No mention on how to use medication and is given two methods.:  1 application 3 times daily for both knees  vs 1 application at bedtime.  Unsure which is the correct dosage for this medication, please advise.

## 2018-04-29 ENCOUNTER — Telehealth: Payer: Self-pay

## 2018-04-29 DIAGNOSIS — E1159 Type 2 diabetes mellitus with other circulatory complications: Secondary | ICD-10-CM

## 2018-04-29 MED ORDER — INSULIN DEGLUDEC 100 UNIT/ML ~~LOC~~ SOPN: 40.0000 [IU] | PEN_INJECTOR | Freq: Every day | SUBCUTANEOUS | Status: AC

## 2018-04-29 NOTE — Telephone Encounter (Signed)
[name], CMA  

## 2018-04-29 NOTE — Telephone Encounter (Signed)
Tyler Aas was called in place of the Lantus

## 2018-04-29 NOTE — Telephone Encounter (Signed)
[employer] insurance no longer covers Lantus, can you change her to either Tyler Aas, Automotive engineer and send to [employer] she has been out of insulin for 3 days, please advise?

## 2018-04-30 ENCOUNTER — Telehealth: Payer: Self-pay

## 2018-05-03 NOTE — Telephone Encounter (Signed)
[name], CMA  

## 2018-05-05 ENCOUNTER — Other Ambulatory Visit: Payer: Self-pay

## 2018-05-10 ENCOUNTER — Encounter: Payer: Medicare Other | Admitting: Registered Nurse

## 2018-05-13 ENCOUNTER — Ambulatory Visit (INDEPENDENT_AMBULATORY_CARE_PROVIDER_SITE_OTHER): Payer: Medicare Other | Admitting: Cardiovascular Disease

## 2018-05-13 ENCOUNTER — Encounter: Payer: Self-pay | Admitting: Cardiovascular Disease

## 2018-05-13 VITALS — BP 162/75 | HR 64 | Ht 64.0 in | Wt 222.0 lb

## 2018-05-13 DIAGNOSIS — I25118 Atherosclerotic heart disease of native coronary artery with other forms of angina pectoris: Secondary | ICD-10-CM | POA: Diagnosis not present

## 2018-05-13 DIAGNOSIS — I5032 Chronic diastolic (congestive) heart failure: Secondary | ICD-10-CM | POA: Diagnosis not present

## 2018-05-13 DIAGNOSIS — E785 Hyperlipidemia, unspecified: Secondary | ICD-10-CM

## 2018-05-13 DIAGNOSIS — Z955 Presence of coronary angioplasty implant and graft: Secondary | ICD-10-CM | POA: Diagnosis not present

## 2018-05-13 DIAGNOSIS — I10 Essential (primary) hypertension: Secondary | ICD-10-CM | POA: Diagnosis not present

## 2018-05-13 DIAGNOSIS — G4733 Obstructive sleep apnea (adult) (pediatric): Secondary | ICD-10-CM | POA: Diagnosis not present

## 2018-05-13 MED ORDER — LISINOPRIL 5 MG PO TABS: 5.0000 mg | ORAL_TABLET | Freq: Every day | ORAL | 6 refills | Status: DC

## 2018-05-13 NOTE — Patient Instructions (Signed)
Medication Instructions:   Begin Lisinopril 5mg  daily.  Continue all other medications.    Labwork:   Testing/Procedures:   Follow-Up: Your physician wants you to follow up in:  1 year.  You will receive a reminder letter in the mail one-two months in advance.  If you don't receive a letter, please call our office to schedule the follow up appointment   Any Other Special Instructions Will Be Listed Below (If Applicable).  If you need a refill on your cardiac medications before your next appointment, please call your pharmacy.

## 2018-05-13 NOTE — Progress Notes (Signed)
SUBJECTIVE: The patient presents for routine follow-up. She has a history of an MI in 2007 with PCI of the circumflex. She also has a history of insulin-dependent diabetes mellitus, sleep apnea, irritable bowel syndrome, GERD, chronic diastolic heart failure, tachycardia and palpitations, and dyslipidemia.  ECG performed in the office today which I ordered and personally interpreted demonstrates normal sinus rhythm with no ischemic ST segment or T-wave abnormalities, nor any arrhythmias.  The patient denies any symptoms of chest pain, palpitations, shortness of breath, lightheadedness, dizziness, leg swelling, orthopnea, PND, and syncope.  She uses CPAP 1-2 nights per week.   Review of Systems: As per "subjective", otherwise negative.  Allergies  Allergen Reactions  . Nitrofuran Derivatives Itching and Swelling  . Amphetamine-Dextroamphet Er Swelling  . Pregabalin Swelling  . Topiramate Other (See Comments)    Tongue tingle  . Verelan [Verapamil] Rash    Current Outpatient Medications  Medication Sig Dispense Refill  . aspirin EC 81 MG tablet Take 1 tablet (81 mg total) by mouth daily.    Marland Kitchen atorvastatin (LIPITOR) 40 MG tablet Take 40 mg by mouth daily.    Marland Kitchen buPROPion (WELLBUTRIN XL) 150 MG 24 hr tablet Take 150-300 mg by mouth See admin instructions. Take 300 mg by mouth in the morning and take 150 mg by mouth at noon    . diclofenac sodium (VOLTAREN) 1 % GEL APPLY TO BOTH KNEES THREE TIMES DAILY 300 g 2  . diltiazem (TIAZAC) 180 MG 24 hr capsule Take 180 mg by mouth daily.    Marland Kitchen estrogens, conjugated, (PREMARIN) 1.25 MG tablet Take 1.25 mg by mouth daily.     Marland Kitchen FLUoxetine (PROZAC) 20 MG capsule Take 20 mg by mouth 2 (two) times daily.   1  . furosemide (LASIX) 20 MG tablet Take 20 mg by mouth daily as needed for fluid.   1  . gabapentin (NEURONTIN) 400 MG capsule Take 400 mg by mouth 3 (three) times daily.  5  . hydrOXYzine (ATARAX/VISTARIL) 10 MG tablet Take 10 mg by mouth  4 (four) times daily as needed for anxiety.    . lidocaine (XYLOCAINE) 5 % ointment Apply 1 application topically at bedtime.     . lidocaine-hydrocortisone (ANAMANTEL HC) 3-0.5 % CREA Place 1 Applicatorful rectally 2 (two) times daily for 7 days. 14 g 0  . LORazepam (ATIVAN) 0.5 MG tablet Take 0.5 mg by mouth 2 (two) times daily as needed for anxiety.   1  . meloxicam (MOBIC) 7.5 MG tablet Take 1 tablet (7.5 mg total) by mouth daily.  4  . metFORMIN (GLUCOPHAGE) 500 MG tablet TAKE 1 TABLET BY MOUTH TWICE DAILY 60 tablet 2  . methocarbamol (ROBAXIN) 500 MG tablet Take 1 tablet (500 mg total) by mouth 3 (three) times daily. (Patient taking differently: Take 500 mg by mouth 3 (three) times daily as needed for muscle spasms. ) 45 tablet 0  . metoCLOPramide (REGLAN) 5 MG tablet Take 5 mg by mouth 3 (three) times daily as needed for nausea or vomiting.   1  . metoprolol succinate (TOPROL-XL) 50 MG 24 hr tablet TAKE 1 AND 1/2 TABLETS BY MOUTH EVERY DAY 135 tablet 1  . montelukast (SINGULAIR) 10 MG tablet Take 10 mg by mouth daily.  5  . morphine (MS CONTIN) 30 MG 12 hr tablet Take 1 tablet (30 mg total) by mouth every 8 (eight) hours. 90 tablet 0  . nitroGLYCERIN (NITROSTAT) 0.4 MG SL tablet Place 1 tablet (0.4  mg total) under the tongue every 5 (five) minutes x 3 doses as needed. For chest pains 25 tablet 3  . ondansetron (ZOFRAN) 4 MG tablet Take 4 mg by mouth every 6 (six) hours as needed for nausea or vomiting.    . pantoprazole (PROTONIX) 40 MG tablet TAKE 1 TABLET BY MOUTH TWICE DAILY BEFORE MEALS (Patient taking differently: Take 40 mg by mouth 2 (two) times daily. ) 60 tablet 5  . potassium chloride (K-DUR,KLOR-CON) 10 MEQ tablet Take 10 mEq by mouth 2 (two) times daily.  12  . PROAIR HFA 108 (90 Base) MCG/ACT inhaler Inhale 2 puffs into the lungs every 4 (four) hours as needed for wheezing or shortness of breath.   8  . ranitidine (ZANTAC) 300 MG tablet Take 1 tablet (300 mg total) by mouth at  bedtime as needed for heartburn. (Patient taking differently: Take 300 mg by mouth 2 (two) times daily. )  3  . sertraline (ZOLOFT) 100 MG tablet Take 1 tablet by mouth daily.    . sucralfate (CARAFATE) 1 g tablet Take 1 tablet (1 g total) by mouth 4 (four) times daily -  with meals and at bedtime. 120 tablet 3  . topiramate (TOPAMAX) 200 MG tablet Take 200 mg by mouth at bedtime.  3  . TRESIBA FLEXTOUCH 100 UNIT/ML SOPN FlexTouch Pen Inject 40 Units into the skin at bedtime.    Marland Kitchen zolpidem (AMBIEN) 10 MG tablet Take 10 mg by mouth at bedtime.  1   No current facility-administered medications for this visit.     Past Medical History:  Diagnosis Date  . Anxiety and depression   . Arthritis   . CAD (coronary artery disease)    Stent placement circumflex coronary 2007, catheterization 2008 patent stents. Normal LV function  . Chest pain   . CHF (congestive heart failure) ([place])   . COPD (chronic obstructive pulmonary disease) ([facility])    no home O2  . Depression   . Diabetes mellitus    Insulin dependent  . Diabetic polyneuropathy ([facility])     severe on multiple medications  . Dyslipidemia   . GERD (gastroesophageal reflux disease)   . Headache(784.0)   . 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 Hypertension   . MI (myocardial infarction) ([place])    2007  . Obstructive sleep apnea    CPAP, setting ?2  . Palpitations   . Tachycardia     Past Surgical History:  Procedure Laterality Date  . ABDOMINAL HYSTERECTOMY    . Arm Surgery Bilateral    due to fall-pt broke both forearms  . BIOPSY N/A 07/01/2013   Procedure: BIOPSY;  Surgeon: Rogene Houston, MD;  [facility];  Service: Endoscopy;  Laterality: N/A;  . CHOLECYSTECTOMY    . COLONOSCOPY  08/06/2011   Procedure: COLONOSCOPY;  Surgeon: Rogene Houston, MD;  [facility];  Service: Endoscopy;  Laterality: N/A;  215  . COLONOSCOPY WITH PROPOFOL N/A 12/31/2012   Procedure: COLONOSCOPY WITH PROPOFOL;  Surgeon: Rogene Houston, MD;   Location: [facility];  Service: Endoscopy;  Laterality: N/A;  in cecum at 0814, total withdrawal time 8mn  . COLONOSCOPY WITH PROPOFOL N/A 02/05/2018   Procedure: COLONOSCOPY WITH PROPOFOL;  Surgeon: RRogene Houston MD;  [facility];  Service: Endoscopy;  Laterality: N/A;  1:25  . CORONARY ANGIOPLASTY WITH STENT PLACEMENT    . ESOPHAGEAL DILATION N/A 06/07/2015   Procedure: ESOPHAGEAL DILATION;  Surgeon: NRogene Houston MD;  [facility];  Service: Endoscopy;  Laterality: N/A;  . ESOPHAGOGASTRODUODENOSCOPY N/A 06/07/2015   Procedure: ESOPHAGOGASTRODUODENOSCOPY (EGD);  Surgeon: Rogene Houston, MD;  [facility];  Service: Endoscopy;  Laterality: N/A;  2:45 - moved to 1:00 - Ann to notify pt  . ESOPHAGOGASTRODUODENOSCOPY (EGD) WITH PROPOFOL N/A 12/31/2012   Procedure: ESOPHAGOGASTRODUODENOSCOPY (EGD) WITH PROPOFOL;  Surgeon: Rogene Houston, MD;  [facility]: [facility];  Service: Endoscopy;  Laterality: N/A;  GE junction 37,  . ESOPHAGOGASTRODUODENOSCOPY (EGD) WITH PROPOFOL N/A 07/01/2013   Procedure: ESOPHAGOGASTRODUODENOSCOPY (EGD) WITH PROPOFOL;  Surgeon: Rogene Houston, MD;  [facility];  Service: Endoscopy;  Laterality: N/A;  950  . MALONEY DILATION N/A 12/31/2012   Procedure: MALONEY DILATION;  Surgeon: Rogene Houston, MD;  [facility];  Service: Endoscopy;  Laterality: N/A;  used a # 54,#56  . MALONEY DILATION N/A 07/01/2013   Procedure: Venia Minks DILATION;  Surgeon: Rogene Houston, MD;  [facility];  Service: Endoscopy;  Laterality: N/A;  54/58; no heme present  . POLYPECTOMY  02/05/2018   Procedure: POLYPECTOMY;  Surgeon: Rogene Houston, MD;  [facility];  Service: Endoscopy;;  distal rectum (HS)    Social History   Socioeconomic History  . Marital status: Legally Separated    Spouse name: Not on file  . Number of children: 4  . Years of education: Not on file  . Highest education level: Not on file  Occupational History  .  Occupation: Disabled  Social Needs  . Financial resource strain: Not on file  . Food insecurity:    Worry: Not on file    Inability: Not on file  . Transportation needs:    Medical: Not on file    Non-medical: Not on file  Tobacco Use  . Smoking status: Never Smoker  . Smokeless tobacco: Never Used  Substance and Sexual Activity  . Alcohol use: No    Alcohol/week: 0.0 standard drinks  . Drug use: No  . Sexual activity: Not on file  Lifestyle  . Physical activity:    Days per week: Not on file    Minutes per session: Not on file  . Stress: Not on file  Relationships  . Social connections:    Talks on phone: Not on file    Gets together: Not on file    Attends religious service: Not on file    Active member of club or organization: Not on file    Attends meetings of clubs or organizations: Not on file    Relationship status: Not on file  . Intimate partner violence:    Fear of current or ex partner: Not on file    Emotionally abused: Not on file    Physically abused: Not on file    Forced sexual activity: Not on file  Other Topics Concern  . Not on file  Social History Narrative   Patient does not get regular exercise   Denies caffeine use      Vitals:   05/13/18 0956  BP: (!) 162/75  Pulse: 64  SpO2: 95%  Weight: 222 lb (100.7 kg)  Height: 5' 4"  (1.626 m)    Wt Readings from Last 3 Encounters:  05/13/18 222 lb (100.7 kg)  04/12/18 215 lb (97.5 kg)  03/08/18 222 lb 12.8 oz (101.1 kg)     PHYSICAL EXAM General: NAD HEENT: Normal. Neck: No JVD, no thyromegaly. Lungs: Clear to auscultation bilaterally with normal respiratory effort. CV: Regular  rate and rhythm, normal S1/S2, no S3/S4, no murmur. No pretibial or periankle edema.  No carotid bruit.   Abdomen: Soft, nontender, no distention.  Neurologic: Alert and oriented.  Psych: Normal affect. Skin: Normal. Musculoskeletal: No gross deformities.    ECG: Reviewed above under  Subjective   Labs: Lab Results  Component Value Date/Time   K 4.9 01/27/2018 02:52 PM   BUN 13 01/27/2018 02:52 PM   BUN 11 11/06/2016 11:12 AM   CREATININE 0.62 01/27/2018 02:52 PM   ALT 7 01/27/2018 02:52 PM   TSH 2.90 02/09/2017 10:25 AM   HGB 12.6 12/07/2017 02:13 PM     Lipids: Lab Results  Component Value Date/Time   LDLCALC UNABLE TO CALCULATE IF TRIGLYCERIDE OVER 400 mg/dL 02/18/2017 05:28 AM   LDLCALC 50 04/15/2016 11:21 AM   CHOL 151 02/18/2017 05:28 AM   CHOL 184 04/15/2016 11:21 AM   TRIG 439 (H) 02/18/2017 05:28 AM   HDL 46 02/18/2017 05:28 AM   HDL 77 04/15/2016 11:21 AM       ASSESSMENT AND PLAN: 1.  Coronary artery disease: Stable ischemic heart disease.  Normal nuclear stress test in June 2018.  Continue aspirin, Lipitor, and metoprolol.  2.  Chronic diastolic heart failure: Euvolemic.  Takes Lasix as needed.  3.  Hypertension: Blood pressure is elevated.  She has normal renal function on 01/27/2018.  She has type 2 diabetes.  I will start lisinopril 5 mg daily for both antihypertensive and renal protective effects.  4.  Hyperlipidemia: I will obtain a copy of lipids from PCP.  Continue atorvastatin.  5.  Obstructive sleep apnea: Intermittently compliant with CPAP.    Disposition: Follow up 1 yr   Kate Sable, M.D., F.A.C.C.

## 2018-05-17 ENCOUNTER — Encounter: Payer: Medicare Other | Attending: Registered Nurse | Admitting: Registered Nurse

## 2018-05-17 ENCOUNTER — Encounter: Payer: Self-pay | Admitting: Registered Nurse

## 2018-05-17 VITALS — BP 164/73 | HR 61 | Resp 14 | Ht 64.0 in | Wt 222.0 lb

## 2018-05-17 DIAGNOSIS — E785 Hyperlipidemia, unspecified: Secondary | ICD-10-CM | POA: Insufficient documentation

## 2018-05-17 DIAGNOSIS — M1712 Unilateral primary osteoarthritis, left knee: Secondary | ICD-10-CM

## 2018-05-17 DIAGNOSIS — M546 Pain in thoracic spine: Secondary | ICD-10-CM | POA: Diagnosis not present

## 2018-05-17 DIAGNOSIS — F419 Anxiety disorder, unspecified: Secondary | ICD-10-CM | POA: Diagnosis not present

## 2018-05-17 DIAGNOSIS — G4733 Obstructive sleep apnea (adult) (pediatric): Secondary | ICD-10-CM | POA: Diagnosis not present

## 2018-05-17 DIAGNOSIS — F329 Major depressive disorder, single episode, unspecified: Secondary | ICD-10-CM

## 2018-05-17 DIAGNOSIS — J449 Chronic obstructive pulmonary disease, unspecified: Secondary | ICD-10-CM | POA: Diagnosis not present

## 2018-05-17 DIAGNOSIS — M7061 Trochanteric bursitis, right hip: Secondary | ICD-10-CM | POA: Insufficient documentation

## 2018-05-17 DIAGNOSIS — M48062 Spinal stenosis, lumbar region with neurogenic claudication: Secondary | ICD-10-CM | POA: Diagnosis not present

## 2018-05-17 DIAGNOSIS — M7062 Trochanteric bursitis, left hip: Secondary | ICD-10-CM | POA: Insufficient documentation

## 2018-05-17 DIAGNOSIS — G8929 Other chronic pain: Secondary | ICD-10-CM | POA: Diagnosis not present

## 2018-05-17 DIAGNOSIS — Z955 Presence of coronary angioplasty implant and graft: Secondary | ICD-10-CM | POA: Diagnosis not present

## 2018-05-17 DIAGNOSIS — Z823 Family history of stroke: Secondary | ICD-10-CM | POA: Diagnosis not present

## 2018-05-17 DIAGNOSIS — M25562 Pain in left knee: Secondary | ICD-10-CM | POA: Insufficient documentation

## 2018-05-17 DIAGNOSIS — M542 Cervicalgia: Secondary | ICD-10-CM | POA: Insufficient documentation

## 2018-05-17 DIAGNOSIS — I2510 Atherosclerotic heart disease of native coronary artery without angina pectoris: Secondary | ICD-10-CM | POA: Diagnosis not present

## 2018-05-17 DIAGNOSIS — I110 Hypertensive heart disease with heart failure: Secondary | ICD-10-CM | POA: Diagnosis not present

## 2018-05-17 DIAGNOSIS — M545 Low back pain: Secondary | ICD-10-CM | POA: Diagnosis not present

## 2018-05-17 DIAGNOSIS — F411 Generalized anxiety disorder: Secondary | ICD-10-CM

## 2018-05-17 DIAGNOSIS — I252 Old myocardial infarction: Secondary | ICD-10-CM | POA: Diagnosis not present

## 2018-05-17 DIAGNOSIS — Z8249 Family history of ischemic heart disease and other diseases of the circulatory system: Secondary | ICD-10-CM | POA: Diagnosis not present

## 2018-05-17 DIAGNOSIS — K219 Gastro-esophageal reflux disease without esophagitis: Secondary | ICD-10-CM | POA: Insufficient documentation

## 2018-05-17 DIAGNOSIS — G4700 Insomnia, unspecified: Secondary | ICD-10-CM | POA: Diagnosis not present

## 2018-05-17 DIAGNOSIS — Z5181 Encounter for therapeutic drug level monitoring: Secondary | ICD-10-CM | POA: Diagnosis not present

## 2018-05-17 DIAGNOSIS — M4726 Other spondylosis with radiculopathy, lumbar region: Secondary | ICD-10-CM | POA: Insufficient documentation

## 2018-05-17 DIAGNOSIS — M25561 Pain in right knee: Secondary | ICD-10-CM | POA: Insufficient documentation

## 2018-05-17 DIAGNOSIS — Z79891 Long term (current) use of opiate analgesic: Secondary | ICD-10-CM | POA: Diagnosis not present

## 2018-05-17 DIAGNOSIS — G894 Chronic pain syndrome: Secondary | ICD-10-CM | POA: Diagnosis not present

## 2018-05-17 DIAGNOSIS — I509 Heart failure, unspecified: Secondary | ICD-10-CM | POA: Diagnosis not present

## 2018-05-17 DIAGNOSIS — M1711 Unilateral primary osteoarthritis, right knee: Secondary | ICD-10-CM | POA: Diagnosis not present

## 2018-05-17 DIAGNOSIS — I25118 Atherosclerotic heart disease of native coronary artery with other forms of angina pectoris: Secondary | ICD-10-CM

## 2018-05-17 DIAGNOSIS — E1142 Type 2 diabetes mellitus with diabetic polyneuropathy: Secondary | ICD-10-CM | POA: Insufficient documentation

## 2018-05-17 DIAGNOSIS — M5416 Radiculopathy, lumbar region: Secondary | ICD-10-CM | POA: Diagnosis not present

## 2018-05-17 MED ORDER — MORPHINE SULFATE ER 30 MG PO TBCR: 30.0000 mg | EXTENDED_RELEASE_TABLET | Freq: Three times a day (TID) | ORAL | 0 refills | Status: DC

## 2018-05-17 NOTE — Progress Notes (Signed)
Subjective:    Patient ID: [name], female    DOB: 10/08/1950, 69 y.o.   MRN: [id number]  HPI: [name] is a 68 y.o. female who returns for follow up appointment for chronic pain and medication refill. She states her pain is located in her mid- lower back radiating into her bilateral lower extremities and bilateral feet. She rates her pain 7. Her current exercise regime is walking and performing stretching exercises.  [name] Morphine equivalent is 90.00 MME. She is also prescribed Lorazepam by Noemi Chapel..We have discussed the black box warning of using opioids and benzodiazepines. I highlighted the dangers of using these drugs together and discussed the adverse events including respiratory suppression, overdose, cognitive impairment and importance of compliance with current regimen. We will continue to monitor and adjust as indicated.  She is being closely monitored and under the care of her psychiatrist Dr. Reece Levy.   Last UDS was Performed on 09/02/2017, it was consistent. UDS ordered for Today.   Pain Inventory Average Pain 7 Pain Right Now 7 My pain is sharp, burning, stabbing, tingling and aching  In the last 24 hours, has pain interfered with the following? General activity 5 Relation with others 4 Enjoyment of life 5 What TIME of day is your pain at its worst? morning Sleep (in general) Fair  Pain is worse with: unsure Pain improves with: heat/ice and medication Relief from Meds: 7  Mobility walk with assistance use a cane use a walker how many minutes can you walk? 5 ability to climb steps?  no do you drive?  no use a wheelchair needs help with transfers  Function retired I need assistance with the following:  dressing, meal prep, household duties and shopping  Neuro/Psych bladder control problems weakness numbness tremor tingling trouble walking depression anxiety  Prior Studies Any changes since last visit?  no  Physicians involved  in your care Any changes since last visit?  no   Family History  Problem Relation Age of Onset  . Heart attack Mother   . Colon cancer Mother   . Heart attack Father   . Stroke Sister   . Hemophilia Child   . Cancer Other   . Coronary artery disease Other   . Cancer Brother   . Cancer Maternal Aunt   . Cancer Maternal Uncle   . Cancer Paternal Aunt   . Cancer Paternal Uncle   . Cancer Brother    Social History   Socioeconomic History  . Marital status: Legally Separated    Spouse name: Not on file  . Number of children: 4  . Years of education: Not on file  . Highest education level: Not on file  Occupational History  . Occupation: Disabled  Social Needs  . Financial resource strain: Not on file  . Food insecurity:    Worry: Not on file    Inability: Not on file  . Transportation needs:    Medical: Not on file    Non-medical: Not on file  Tobacco Use  . Smoking status: Never Smoker  . Smokeless tobacco: Never Used  Substance and Sexual Activity  . Alcohol use: No    Alcohol/week: 0.0 standard drinks  . Drug use: No  . Sexual activity: Not on file  Lifestyle  . Physical activity:    Days per week: Not on file    Minutes per session: Not on file  . Stress: Not on file  Relationships  . Social connections:  Talks on phone: Not on file    Gets together: Not on file    Attends religious service: Not on file    Active member of club or organization: Not on file    Attends meetings of clubs or organizations: Not on file    Relationship status: Not on file  Other Topics Concern  . Not on file  Social History Narrative   Patient does not get regular exercise   Denies caffeine use    Past Surgical History:  Procedure Laterality Date  . ABDOMINAL HYSTERECTOMY    . Arm Surgery Bilateral    due to fall-pt broke both forearms  . BIOPSY N/A 07/01/2013   Procedure: BIOPSY;  Surgeon: Rogene Houston, MD;  [facility]: [facility];  Service: Endoscopy;  Laterality: N/A;    . CHOLECYSTECTOMY    . COLONOSCOPY  08/06/2011   Procedure: COLONOSCOPY;  Surgeon: Rogene Houston, MD;  [facility];  Service: Endoscopy;  Laterality: N/A;  215  . COLONOSCOPY WITH PROPOFOL N/A 12/31/2012   Procedure: COLONOSCOPY WITH PROPOFOL;  Surgeon: Rogene Houston, MD;  Location: [facility];  Service: Endoscopy;  Laterality: N/A;  in cecum at 0814, total withdrawal time 51min  . COLONOSCOPY WITH PROPOFOL N/A 02/05/2018   Procedure: COLONOSCOPY WITH PROPOFOL;  Surgeon: Rogene Houston, MD;  [facility];  Service: Endoscopy;  Laterality: N/A;  1:25  . CORONARY ANGIOPLASTY WITH STENT PLACEMENT    . ESOPHAGEAL DILATION N/A 06/07/2015   Procedure: ESOPHAGEAL DILATION;  Surgeon: Rogene Houston, MD;  [facility];  Service: Endoscopy;  Laterality: N/A;  . ESOPHAGOGASTRODUODENOSCOPY N/A 06/07/2015   Procedure: ESOPHAGOGASTRODUODENOSCOPY (EGD);  Surgeon: Rogene Houston, MD;  [facility];  Service: Endoscopy;  Laterality: N/A;  2:45 - moved to 1:00 - Ann to notify pt  . ESOPHAGOGASTRODUODENOSCOPY (EGD) WITH PROPOFOL N/A 12/31/2012   Procedure: ESOPHAGOGASTRODUODENOSCOPY (EGD) WITH PROPOFOL;  Surgeon: Rogene Houston, MD;  [facility];  Service: Endoscopy;  Laterality: N/A;  GE junction 37,  . ESOPHAGOGASTRODUODENOSCOPY (EGD) WITH PROPOFOL N/A 07/01/2013   Procedure: ESOPHAGOGASTRODUODENOSCOPY (EGD) WITH PROPOFOL;  Surgeon: Rogene Houston, MD;  [facility];  Service: Endoscopy;  Laterality: N/A;  950  . MALONEY DILATION N/A 12/31/2012   Procedure: MALONEY DILATION;  Surgeon: Rogene Houston, MD;  [facility];  Service: Endoscopy;  Laterality: N/A;  used a # 54,#56  . MALONEY DILATION N/A 07/01/2013   Procedure: Venia Minks DILATION;  Surgeon: Rogene Houston, MD;  [facility];  Service: Endoscopy;  Laterality: N/A;  54/58; no heme present  . POLYPECTOMY  02/05/2018   Procedure: POLYPECTOMY;  Surgeon: Rogene Houston, MD;  [facility];  Service: Endoscopy;;  distal rectum (HS)   Past Medical History:  Diagnosis Date  . Anxiety and depression   . Arthritis   . CAD (coronary artery disease)    Stent placement circumflex coronary 2007, catheterization 2008 patent stents. Normal LV function  . Chest pain   . CHF (congestive heart failure) ([place])   . COPD (chronic obstructive pulmonary disease) (H[facility])    no home O2  . Depression   . Diabetes mellitus    Insulin dependent  . Diabetic polyneuropathy (HCC)     severe on multiple medications  . Dyslipidemia   . GERD (gastroesophageal reflux disease)   . Headache(784.0)   . 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 Hypertension   . MI (myocardial infarction) ([place])  2007  . Obstructive sleep apnea    CPAP, setting ?2  . Palpitations   . Tachycardia    Ht 5\' 4"  (1.626 m)   Wt 222 lb (100.7 kg)   BMI 38.11 kg/m   Opioid Risk Score:   Fall Risk Score:  `1  Depression screen PHQ 2/9  Depression screen [facility] 2/9 06/12/2017 04/14/2017 02/17/2017 01/14/2017 12/24/2016 11/11/2016 10/28/2016  Decreased Interest 3 3 0 3 0 0 2  Down, Depressed, Hopeless 3 3 0 3 0 0 -  PHQ - 2 Score 6 6 0 6 0 0 2  Altered sleeping 3 - - - - - -  Tired, decreased energy 3 - - - - - -  Change in appetite 0 - - - - - -  Feeling bad or failure about yourself  3 - - - - - -  Trouble concentrating 3 - - - - - -  Moving slowly or fidgety/restless 1 - - - - - -  Suicidal thoughts 0 - - - - - -  PHQ-9 Score 19 - - - - - -  Difficult doing work/chores Very difficult - - - - - -  Some recent data might be hidden    Review of Systems  Constitutional: Negative.   HENT: Negative.   Eyes: Negative.   Respiratory: Negative.   Gastrointestinal: Negative.   Endocrine: Negative.   Genitourinary: Negative.   Musculoskeletal: Positive for back pain and gait problem.  Allergic/Immunologic: Negative.   Neurological: Positive for tremors, weakness and numbness.       Tingling  Psychiatric/Behavioral: Positive  for dysphoric mood. The patient is nervous/anxious.   All other systems reviewed and are negative.      Objective:   Physical Exam Vitals signs and nursing note reviewed.  Constitutional:      Appearance: Normal appearance.  Neck:     Musculoskeletal: Normal range of motion and neck supple.  Cardiovascular:     Rate and Rhythm: Normal rate and regular rhythm.     Pulses: Normal pulses.     Heart sounds: Normal heart sounds.  Pulmonary:     Effort: Pulmonary effort is normal.     Breath sounds: Normal breath sounds.  Musculoskeletal:     Comments: Normal Muscle Bulk and Muscle Testing Reveals:  Upper Extremities: Full  ROM and Muscle Strength 5/5 Thoracic Paraspinal Tenderness: T-7-T-9  Lumbar Paraspinal Tenderness: L-3-L-5 Lower Extremities: Full ROM and Muscle Strength 5/5 Arises from table slowly  Narrow based Gait   Skin:    General: Skin is warm and dry.  Neurological:     Mental Status: She is alert and oriented to person, place, and time.  Psychiatric:        Mood and Affect: Mood normal.        Behavior: Behavior normal.           Assessment & Plan:  1.Lumbar pain lumbar spondylosis: Encouraged to continue toincrease Activity as tolerated. 05/17/2018. Continue current medication regimen.Refilled: MS Contin 30 MG one tablet every 8 hours #90. We will continue the opioid monitoring program, this consists of regular clinic visits, examinations, urine drug screen, pill counts as well as use of [place] Controlled Substance Reporting System. 2. Lumbar Radiculitis: Continuecurrent medication regimen withGabapentin.05/17/2018 3. Bilateral Knee Pain/ Degenerative: Continue Voltaren Gel. Continue to Monitor.05/17/2018 4. Severe diabetic poly neuropathy: Continuecurrent medication regimen withGabapentin.05/17/2018 5. Insomnia: ContinueTrazodone PsychiatryFollowing.05/17/2018 6.Anxiety/Depression:Lorazepam,ProzacandHydroxyzinePrescribed  byPsychiatry.05/17/2018 7. De Quervains Tenosynovitis: No Complaints Today.05/17/2018 8. Bilateral Greater Trochanteric Bursitis:No  complaints today.Continue with Ice and Heat Therapy. Continue to Monitor.05/17/2018. 9. Cervicalgia/ Cervical Radiculitis:No complaints today. Continue with HEP as Tolerated and continue to monitor. 05/17/2018 10. Chronic Bilateral Thoracic Back Pain: Continue HEP as Tolerated. Continue current medication regimen. Continue to monitor. 05/17/2018.  68minutes of face to face patient care time was spent during this visit. All questions were encouraged and answered.  F/U in 1 month

## 2018-05-19 DIAGNOSIS — F331 Major depressive disorder, recurrent, moderate: Secondary | ICD-10-CM | POA: Diagnosis not present

## 2018-05-20 LAB — 6-ACETYLMORPHINE,TOXASSURE ADD
6-ACETYLMORPHINE: NEGATIVE
6-acetylmorphine: NOT DETECTED ng/mg creat

## 2018-05-20 LAB — TOXASSURE SELECT,+ANTIDEPR,UR

## 2018-05-21 ENCOUNTER — Encounter: Payer: Self-pay | Admitting: *Deleted

## 2018-05-21 ENCOUNTER — Telehealth: Payer: Self-pay | Admitting: *Deleted

## 2018-05-21 NOTE — Telephone Encounter (Signed)
Urine drug screen for this encounter is consistent for prescribed medication 

## 2018-06-01 ENCOUNTER — Other Ambulatory Visit: Payer: Self-pay | Admitting: "Endocrinology

## 2018-06-17 ENCOUNTER — Other Ambulatory Visit: Payer: Self-pay

## 2018-06-17 ENCOUNTER — Encounter: Payer: Self-pay | Admitting: Registered Nurse

## 2018-06-17 ENCOUNTER — Encounter: Payer: Medicare Other | Attending: Registered Nurse | Admitting: Registered Nurse

## 2018-06-17 VITALS — Ht 64.0 in | Wt 222.0 lb

## 2018-06-17 DIAGNOSIS — M1712 Unilateral primary osteoarthritis, left knee: Secondary | ICD-10-CM

## 2018-06-17 DIAGNOSIS — F329 Major depressive disorder, single episode, unspecified: Secondary | ICD-10-CM | POA: Diagnosis not present

## 2018-06-17 DIAGNOSIS — Z8249 Family history of ischemic heart disease and other diseases of the circulatory system: Secondary | ICD-10-CM | POA: Insufficient documentation

## 2018-06-17 DIAGNOSIS — M7061 Trochanteric bursitis, right hip: Secondary | ICD-10-CM | POA: Diagnosis not present

## 2018-06-17 DIAGNOSIS — M546 Pain in thoracic spine: Secondary | ICD-10-CM | POA: Insufficient documentation

## 2018-06-17 DIAGNOSIS — G8929 Other chronic pain: Secondary | ICD-10-CM | POA: Insufficient documentation

## 2018-06-17 DIAGNOSIS — M7062 Trochanteric bursitis, left hip: Secondary | ICD-10-CM

## 2018-06-17 DIAGNOSIS — M542 Cervicalgia: Secondary | ICD-10-CM | POA: Insufficient documentation

## 2018-06-17 DIAGNOSIS — Z79891 Long term (current) use of opiate analgesic: Secondary | ICD-10-CM

## 2018-06-17 DIAGNOSIS — M25561 Pain in right knee: Secondary | ICD-10-CM | POA: Insufficient documentation

## 2018-06-17 DIAGNOSIS — I25118 Atherosclerotic heart disease of native coronary artery with other forms of angina pectoris: Secondary | ICD-10-CM

## 2018-06-17 DIAGNOSIS — M545 Low back pain: Secondary | ICD-10-CM | POA: Insufficient documentation

## 2018-06-17 DIAGNOSIS — F419 Anxiety disorder, unspecified: Secondary | ICD-10-CM | POA: Insufficient documentation

## 2018-06-17 DIAGNOSIS — I110 Hypertensive heart disease with heart failure: Secondary | ICD-10-CM | POA: Insufficient documentation

## 2018-06-17 DIAGNOSIS — M48062 Spinal stenosis, lumbar region with neurogenic claudication: Secondary | ICD-10-CM

## 2018-06-17 DIAGNOSIS — M1711 Unilateral primary osteoarthritis, right knee: Secondary | ICD-10-CM

## 2018-06-17 DIAGNOSIS — M4726 Other spondylosis with radiculopathy, lumbar region: Secondary | ICD-10-CM | POA: Insufficient documentation

## 2018-06-17 DIAGNOSIS — E1142 Type 2 diabetes mellitus with diabetic polyneuropathy: Secondary | ICD-10-CM | POA: Insufficient documentation

## 2018-06-17 DIAGNOSIS — I2510 Atherosclerotic heart disease of native coronary artery without angina pectoris: Secondary | ICD-10-CM | POA: Insufficient documentation

## 2018-06-17 DIAGNOSIS — I509 Heart failure, unspecified: Secondary | ICD-10-CM | POA: Insufficient documentation

## 2018-06-17 DIAGNOSIS — Z823 Family history of stroke: Secondary | ICD-10-CM | POA: Insufficient documentation

## 2018-06-17 DIAGNOSIS — Z5181 Encounter for therapeutic drug level monitoring: Secondary | ICD-10-CM | POA: Diagnosis not present

## 2018-06-17 DIAGNOSIS — F411 Generalized anxiety disorder: Secondary | ICD-10-CM | POA: Diagnosis not present

## 2018-06-17 DIAGNOSIS — G4700 Insomnia, unspecified: Secondary | ICD-10-CM | POA: Insufficient documentation

## 2018-06-17 DIAGNOSIS — G4733 Obstructive sleep apnea (adult) (pediatric): Secondary | ICD-10-CM | POA: Insufficient documentation

## 2018-06-17 DIAGNOSIS — I252 Old myocardial infarction: Secondary | ICD-10-CM | POA: Insufficient documentation

## 2018-06-17 DIAGNOSIS — G894 Chronic pain syndrome: Secondary | ICD-10-CM | POA: Diagnosis not present

## 2018-06-17 DIAGNOSIS — J449 Chronic obstructive pulmonary disease, unspecified: Secondary | ICD-10-CM | POA: Insufficient documentation

## 2018-06-17 DIAGNOSIS — M25562 Pain in left knee: Secondary | ICD-10-CM | POA: Insufficient documentation

## 2018-06-17 DIAGNOSIS — Z955 Presence of coronary angioplasty implant and graft: Secondary | ICD-10-CM | POA: Insufficient documentation

## 2018-06-17 DIAGNOSIS — M5416 Radiculopathy, lumbar region: Secondary | ICD-10-CM

## 2018-06-17 DIAGNOSIS — K219 Gastro-esophageal reflux disease without esophagitis: Secondary | ICD-10-CM | POA: Insufficient documentation

## 2018-06-17 DIAGNOSIS — E785 Hyperlipidemia, unspecified: Secondary | ICD-10-CM | POA: Insufficient documentation

## 2018-06-17 MED ORDER — MORPHINE SULFATE ER 30 MG PO TBCR: 30.0000 mg | EXTENDED_RELEASE_TABLET | Freq: Three times a day (TID) | ORAL | 0 refills | Status: DC

## 2018-06-17 NOTE — Progress Notes (Addendum)
Subjective:    Patient ID: [name], female    DOB: 1950/12/13, 68 y.o.   MRN: [id number]  HPI: [name] is a 68 y.o. female her appointment was changed ,due to national recommendations of social distancing due to [place] 19, an audio/video telehealth visit is felt to be most appropriate for this patient at this time.  See Chart message from today for the patient's consent to telehealth from [place].     She states her pain is located in her lower back pain radiating into her lower back radiating into her bilateral lower extremities and bilateral feet with tingling and burning. She rates her pain 7. Her current exercise regime is walking.  Mancel Parsons CMA: Performed the Health and History: Questions  I Danella Sensing ANP-C verified that I am speaking with the correct person using two identifiers".  Ms. Kerrigan Morphine equivalent is 90.00MME. She is also prescribed Lorazepam by Noemi Chapel. We have discussed the black box warning of using opioids and benzodiazepines. I highlighted the dangers of using these drugs together and discussed the adverse events including respiratory suppression, overdose, cognitive impairment and importance of compliance with current regimen. We will continue to monitor and adjust as indicated.  She  is being closely monitored and under the care of her psychiatrist Dr. Reece Levy. She verbalizes understanding.    Last UDS was Performed on 05/17/2018  Pain Inventory Average Pain 7 Pain Right Now 7 My pain is constant, sharp, burning, stabbing and tingling  In the last 24 hours, has pain interfered with the following? General activity 6 Relation with others 5 Enjoyment of life 5 What TIME of day is your pain at its worst? all Sleep (in general) Good  Pain is worse with: walking, standing and some activites Pain improves with: pacing activities and medication Relief from Meds: 9  Mobility walk with assistance use a  walker ability to climb steps?  no do you drive?  no  Function disabled: date disabled .  Neuro/Psych weakness numbness tingling trouble walking dizziness confusion depression anxiety  Prior Studies Any changes since last visit?  no  Physicians involved in your care Any changes since last visit?  no   Family History  Problem Relation Age of Onset  . Heart attack Mother   . Colon cancer Mother   . Heart attack Father   . Stroke Sister   . Hemophilia Child   . Cancer Other   . Coronary artery disease Other   . Cancer Brother   . Cancer Maternal Aunt   . Cancer Maternal Uncle   . Cancer Paternal Aunt   . Cancer Paternal Uncle   . Cancer Brother    Social History   Socioeconomic History  . Marital status: Legally Separated    Spouse name: Not on file  . Number of children: 4  . Years of education: Not on file  . Highest education level: Not on file  Occupational History  . Occupation: Disabled  Social Needs  . Financial resource strain: Not on file  . Food insecurity:    Worry: Not on file    Inability: Not on file  . Transportation needs:    Medical: Not on file    Non-medical: Not on file  Tobacco Use  . Smoking status: Never Smoker  . Smokeless tobacco: Never Used  Substance and Sexual Activity  . Alcohol use: No    Alcohol/week: 0.0 standard drinks  . Drug use:  No  . Sexual activity: Not on file  Lifestyle  . Physical activity:    Days per week: Not on file    Minutes per session: Not on file  . Stress: Not on file  Relationships  . Social connections:    Talks on phone: Not on file    Gets together: Not on file    Attends religious service: Not on file    Active member of club or organization: Not on file    Attends meetings of clubs or organizations: Not on file    Relationship status: Not on file  Other Topics Concern  . Not on file  Social History Narrative   Patient does not get regular exercise   Denies caffeine use    Past  Surgical History:  Procedure Laterality Date  . ABDOMINAL HYSTERECTOMY    . Arm Surgery Bilateral    due to fall-pt broke both forearms  . BIOPSY N/A 07/01/2013   Procedure: BIOPSY;  Surgeon: Rogene Houston, MD;  [facility];  Service: Endoscopy;  Laterality: N/A;  . CHOLECYSTECTOMY    . COLONOSCOPY  08/06/2011   Procedure: COLONOSCOPY;  Surgeon: Rogene Houston, MD;  [facility];  Service: Endoscopy;  Laterality: N/A;  215  . COLONOSCOPY WITH PROPOFOL N/A 12/31/2012   Procedure: COLONOSCOPY WITH PROPOFOL;  Surgeon: Rogene Houston, MD;  [facility]: [facility];  Service: Endoscopy;  Laterality: N/A;  in cecum at 0814, total withdrawal time 33mn  . COLONOSCOPY WITH PROPOFOL N/A 02/05/2018   Procedure: COLONOSCOPY WITH PROPOFOL;  Surgeon: RRogene Houston MD;  [facility];  Service: Endoscopy;  Laterality: N/A;  1:25  . CORONARY ANGIOPLASTY WITH STENT PLACEMENT    . ESOPHAGEAL DILATION N/A 06/07/2015   Procedure: ESOPHAGEAL DILATION;  Surgeon: NRogene Houston MD;  [facility];  Service: Endoscopy;  Laterality: N/A;  . ESOPHAGOGASTRODUODENOSCOPY N/A 06/07/2015   Procedure: ESOPHAGOGASTRODUODENOSCOPY (EGD);  Surgeon: NRogene Houston MD;  [facility];  Service: Endoscopy;  Laterality: N/A;  2:45 - moved to 1:00 - Ann to notify pt  . ESOPHAGOGASTRODUODENOSCOPY (EGD) WITH PROPOFOL N/A 12/31/2012   Procedure: ESOPHAGOGASTRODUODENOSCOPY (EGD) WITH PROPOFOL;  Surgeon: NRogene Houston MD;  [facility];  Service: Endoscopy;  Laterality: N/A;  GE junction 37,  . ESOPHAGOGASTRODUODENOSCOPY (EGD) WITH PROPOFOL N/A 07/01/2013   Procedure: ESOPHAGOGASTRODUODENOSCOPY (EGD) WITH PROPOFOL;  Surgeon: NRogene Houston MD;  [facility];  Service: Endoscopy;  Laterality: N/A;  950  . MALONEY DILATION N/A 12/31/2012   Procedure: MALONEY DILATION;  Surgeon: NRogene Houston MD;  [facility];  Service: Endoscopy;  Laterality: N/A;  used a # 54,#56  .  MALONEY DILATION N/A 07/01/2013   Procedure: MVenia MinksDILATION;  Surgeon: NRogene Houston MD;  [facility];  Service: Endoscopy;  Laterality: N/A;  54/58; no heme present  . POLYPECTOMY  02/05/2018   Procedure: POLYPECTOMY;  Surgeon: RRogene Houston MD;  [facility];  Service: Endoscopy;;  distal rectum (HS)   Past Medical History:  Diagnosis Date  . Anxiety and depression   . Arthritis   . CAD (coronary artery disease)    Stent placement circumflex coronary 2007, catheterization 2008 patent stents. Normal LV function  . Chest pain   . [place]   . COPD (chronic obstructive pulmonary disease) ([facility])    no home O2  . Depression   . Diabetes mellitus    Insulin  dependent  . Diabetic polyneuropathy (HCC)     severe on multiple medications  . Dyslipidemia   . GERD (gastroesophageal reflux disease)   . Headache(784.0)   . 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 Hypertension   . MI (myocardial infarction) ([place])    2007  . Obstructive sleep apnea    CPAP, setting ?2  . Palpitations   . Tachycardia    Ht 5' 4"  (1.626 m)   Wt 222 lb (100.7 kg)   BMI 38.11 kg/m   Opioid Risk Score:   Fall Risk Score:  `1  Depression screen PHQ 2/9  Depression screen [facility] 2/9 06/12/2017 04/14/2017 02/17/2017 01/14/2017 12/24/2016 11/11/2016 10/28/2016  Decreased Interest 3 3 0 3 0 0 2  Down, Depressed, Hopeless 3 3 0 3 0 0 -  PHQ - 2 Score 6 6 0 6 0 0 2  Altered sleeping 3 - - - - - -  Tired, decreased energy 3 - - - - - -  Change in appetite 0 - - - - - -  Feeling bad or failure about yourself  3 - - - - - -  Trouble concentrating 3 - - - - - -  Moving slowly or fidgety/restless 1 - - - - - -  Suicidal thoughts 0 - - - - - -  PHQ-9 Score 19 - - - - - -  Difficult doing work/chores Very difficult - - - - - -  Some recent data might be hidden     Review of Systems  Constitutional: Negative.   HENT: Negative.   Eyes: Negative.   Respiratory: Negative.    Cardiovascular: Negative.   Gastrointestinal: Negative.   Endocrine: Negative.   Genitourinary: Negative.   Musculoskeletal: Positive for arthralgias, back pain and gait problem.  Skin: Negative.   Allergic/Immunologic: Negative.   Neurological: Positive for weakness and numbness.       Tingling  Psychiatric/Behavioral: Positive for confusion and dysphoric mood. The patient is nervous/anxious.        Objective:   Physical Exam Vitals signs and nursing note reviewed.  Neurological:     Mental Status: She is oriented to person, place, and time.           Assessment & Plan:  1.Lumbar pain lumbar spondylosis: Encouraged to continue toincrease Activity as tolerated. 06/17/2018. Continue current medication regimen.Refilled: MS Contin 30 MG one tablet every 8 hours #90. We will continue the opioid monitoring program, this consists of regular clinic visits, examinations, urine drug screen, pill counts as well as use of [place] Controlled Substance Reporting System. 2. Lumbar Radiculitis: Continuecurrent medication regimen withGabapentin.06/17/2018 3. Bilateral Knee Pain/ Degenerative: Continue Voltaren Gel. Continue to Monitor.06/17/2018 4. Severe diabetic poly neuropathy: Continuecurrent medication regimen withGabapentin.06/17/2018 5. Insomnia: ContinueTrazodone PsychiatryFollowing.06/17/2018 6.Anxiety/Depression:Lorazepam,ProzacandHydroxyzinePrescribed byPsychiatry.06/17/2018 7. De Quervains Tenosynovitis: No Complaints Today.06/17/2018 8. Bilateral Greater Trochanteric Bursitis: Continue with Ice and Heat Therapy. Continue to Monitor.06/17/2018. 9. Cervicalgia/ Cervical Radiculitis:No complaints today. Continue with HEP as Tolerated and continue to monitor.06/17/2018 10. Chronic Bilateral Thoracic Back Pain: Continue HEP as Tolerated. Continue current medication regimen. Continue to monitor.06/17/2018.  F/U in 1 month  Location of patient:Home  Location of provider: Office Time Spent: 10 Minutes

## 2018-07-04 ENCOUNTER — Other Ambulatory Visit (INDEPENDENT_AMBULATORY_CARE_PROVIDER_SITE_OTHER): Payer: Self-pay | Admitting: Internal Medicine

## 2018-07-04 ENCOUNTER — Other Ambulatory Visit: Payer: Self-pay | Admitting: Physical Medicine & Rehabilitation

## 2018-07-06 DIAGNOSIS — E782 Mixed hyperlipidemia: Secondary | ICD-10-CM | POA: Diagnosis not present

## 2018-07-06 DIAGNOSIS — E1159 Type 2 diabetes mellitus with other circulatory complications: Secondary | ICD-10-CM | POA: Diagnosis not present

## 2018-07-06 DIAGNOSIS — E559 Vitamin D deficiency, unspecified: Secondary | ICD-10-CM | POA: Diagnosis not present

## 2018-07-07 LAB — HEMOGLOBIN A1C
Hgb A1c MFr Bld: 8.1 % of total Hgb — ABNORMAL HIGH (ref <5.7)
Mean Plasma Glucose: 186 (calc)
eAG (mmol/L): 10.3 (calc)

## 2018-07-07 LAB — LIPID PANEL
Cholesterol: 173 mg/dL (ref <200)
HDL: 62 mg/dL (ref >=50)
LDL Cholesterol (Calc): 90 mg/dL (calc)
Non-HDL Cholesterol (Calc): 111 mg/dL (calc) (ref <130)
Total CHOL/HDL Ratio: 2.8 (calc) (ref <5.0)
Triglycerides: 109 mg/dL (ref <150)

## 2018-07-07 LAB — COMPLETE METABOLIC PANEL WITH GFR
AG Ratio: 1.5 (calc) (ref 1.0–2.5)
ALT: 12 U/L (ref 6–29)
AST: 11 U/L (ref 10–35)
Albumin: 3.6 g/dL (ref 3.6–5.1)
Alkaline phosphatase (APISO): 68 U/L (ref 37–153)
BUN: 16 mg/dL (ref 7–25)
CO2: 29 mmol/L (ref 20–32)
Calcium: 8.9 mg/dL (ref 8.6–10.4)
Chloride: 107 mmol/L (ref 98–110)
Creat: 0.71 mg/dL (ref 0.50–0.99)
GFR, Est African American: 104 mL/min/{1.73_m2} (ref >=60)
GFR, Est Non African American: 89 mL/min/{1.73_m2} (ref >=60)
Globulin: 2.4 g/dL (calc) (ref 1.9–3.7)
Glucose, Bld: 182 mg/dL — ABNORMAL HIGH (ref 65–99)
Potassium: 4.7 mmol/L (ref 3.5–5.3)
Sodium: 141 mmol/L (ref 135–146)
Total Bilirubin: 0.3 mg/dL (ref 0.2–1.2)
Total Protein: 6 g/dL — ABNORMAL LOW (ref 6.1–8.1)

## 2018-07-07 LAB — T4, FREE: Free T4: 1.2 ng/dL (ref 0.8–1.8)

## 2018-07-07 LAB — VITAMIN D 25 HYDROXY (VIT D DEFICIENCY, FRACTURES): Vit D, 25-Hydroxy: 14 ng/mL — ABNORMAL LOW (ref 30–100)

## 2018-07-07 LAB — TSH: TSH: 1.83 mIU/L (ref 0.40–4.50)

## 2018-07-08 ENCOUNTER — Encounter: Payer: Self-pay | Admitting: "Endocrinology

## 2018-07-08 ENCOUNTER — Other Ambulatory Visit: Payer: Self-pay

## 2018-07-08 ENCOUNTER — Ambulatory Visit (INDEPENDENT_AMBULATORY_CARE_PROVIDER_SITE_OTHER): Payer: Medicare Other | Admitting: "Endocrinology

## 2018-07-08 DIAGNOSIS — I10 Essential (primary) hypertension: Secondary | ICD-10-CM | POA: Diagnosis not present

## 2018-07-08 DIAGNOSIS — E1159 Type 2 diabetes mellitus with other circulatory complications: Secondary | ICD-10-CM

## 2018-07-08 DIAGNOSIS — E782 Mixed hyperlipidemia: Secondary | ICD-10-CM | POA: Diagnosis not present

## 2018-07-08 DIAGNOSIS — E559 Vitamin D deficiency, unspecified: Secondary | ICD-10-CM | POA: Insufficient documentation

## 2018-07-08 MED ORDER — VITAMIN D3 125 MCG (5000 UT) PO CAPS: 5000.0000 [IU] | ORAL_CAPSULE | Freq: Every day | ORAL | 0 refills | Status: DC

## 2018-07-08 NOTE — Progress Notes (Signed)
Endocrinology Telehealth Visit Follow up Note -During COVID -19 Pandemic  This visit type was conducted due to national recommendations for restrictions regarding the COVID-19 Pandemic  in an effort to limit this patient's exposure and mitigate transmission of the corona virus.  Due to his co-morbid illnesses, this patient is at  moderate to high risk for complications without adequate follow up.  This format is felt to be most appropriate for this patient at this time.  All issues noted in this document were discussed and addressed. The format was not optimal for physical exam.  Patient has verbally consented to this visit, and please refer to the patient's chart for details of arrangement for this telehealth for [facility] .    Subjective:    Patient ID: [name], female    DOB: 12/27/1950. Patient is being engaged in telehealth for follow-up for management of currently uncontrolled, complicated type 2 diabetes; hyperlipidemia; hypertension.  Past Medical History:  Diagnosis Date  . Anxiety and depression   . Arthritis   . CAD (coronary artery disease)    Stent placement circumflex coronary 2007, catheterization 2008 patent stents. Normal LV function  . Chest pain   . CHF (congestive heart failure) ([place])   . COPD (chronic obstructive pulmonary disease) (H[facility])    no home O2  . Depression   . Diabetes mellitus    Insulin dependent  . Diabetic polyneuropathy ([facility])     severe on multiple medications  . Dyslipidemia   . GERD (gastroesophageal reflux disease)   . Headache(784.0)   . 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 Hypertension   . MI (myocardial infarction) ([place])    2007  . Obstructive sleep apnea    CPAP, setting ?2  . Palpitations   . Tachycardia    Past Surgical History:  Procedure Laterality Date  . ABDOMINAL HYSTERECTOMY    . Arm Surgery Bilateral    due to fall-pt broke both forearms  . BIOPSY N/A 07/01/2013   Procedure:  BIOPSY;  Surgeon: Rogene Houston, MD;  Location: [facility];  Service: Endoscopy;  Laterality: N/A;  . CHOLECYSTECTOMY    . COLONOSCOPY  08/06/2011   Procedure: COLONOSCOPY;  Surgeon: Rogene Houston, MD;  Location: [facility];  Service: Endoscopy;  Laterality: N/A;  215  . COLONOSCOPY WITH PROPOFOL N/A 12/31/2012   Procedure: COLONOSCOPY WITH PROPOFOL;  Surgeon: Rogene Houston, MD;  Location: [facility];  Service: Endoscopy;  Laterality: N/A;  in cecum at 0814, total withdrawal time 68min  . COLONOSCOPY WITH PROPOFOL N/A 02/05/2018   Procedure: COLONOSCOPY WITH PROPOFOL;  Surgeon: Rogene Houston, MD;  [facility]: [facility];  Service: Endoscopy;  Laterality: N/A;  1:25  . CORONARY ANGIOPLASTY WITH STENT PLACEMENT    . ESOPHAGEAL DILATION N/A 06/07/2015   Procedure: ESOPHAGEAL DILATION;  Surgeon: Rogene Houston, MD;  [facility];  Service: Endoscopy;  Laterality: N/A;  . ESOPHAGOGASTRODUODENOSCOPY N/A 06/07/2015   Procedure: ESOPHAGOGASTRODUODENOSCOPY (EGD);  Surgeon: Rogene Houston, MD;  [facility];  Service: Endoscopy;  Laterality: N/A;  2:45 - moved to 1:00 - Ann to notify pt  . ESOPHAGOGASTRODUODENOSCOPY (EGD) WITH PROPOFOL N/A 12/31/2012   Procedure: ESOPHAGOGASTRODUODENOSCOPY (  EGD) WITH PROPOFOL;  Surgeon: Rogene Houston, MD;  Location: [facility];  Service: Endoscopy;  Laterality: N/A;  GE junction 37,  . ESOPHAGOGASTRODUODENOSCOPY (EGD) WITH PROPOFOL N/A 07/01/2013   Procedure: ESOPHAGOGASTRODUODENOSCOPY (EGD) WITH PROPOFOL;  Surgeon: Rogene Houston, MD;  Location: [facility];  Service: Endoscopy;  Laterality: N/A;  950  . MALONEY DILATION N/A 12/31/2012   Procedure: MALONEY DILATION;  Surgeon: Rogene Houston, MD;  Location: [facility];  Service: Endoscopy;  Laterality: N/A;  used a # 54,#56  . MALONEY DILATION N/A 07/01/2013   Procedure: Venia Minks DILATION;  Surgeon: Rogene Houston, MD;  Location: [facility];  Service: Endoscopy;  Laterality: N/A;  54/58; no heme present  .  POLYPECTOMY  02/05/2018   Procedure: POLYPECTOMY;  Surgeon: Rogene Houston, MD;  [facility];  Service: Endoscopy;;  distal rectum (HS)   Social History   Socioeconomic History  . Marital status: Legally Separated    Spouse name: Not on file  . Number of children: 4  . Years of education: Not on file  . Highest education level: Not on file  Occupational History  . Occupation: Disabled  Social Needs  . Financial resource strain: Not on file  . Food insecurity:    Worry: Not on file    Inability: Not on file  . Transportation needs:    Medical: Not on file    Non-medical: Not on file  Tobacco Use  . Smoking status: Never Smoker  . Smokeless tobacco: Never Used  Substance and Sexual Activity  . Alcohol use: No    Alcohol/week: 0.0 standard drinks  . Drug use: No  . Sexual activity: Not on file  Lifestyle  . Physical activity:    Days per week: Not on file    Minutes per session: Not on file  . Stress: Not on file  Relationships  . Social connections:    Talks on phone: Not on file    Gets together: Not on file    Attends religious service: Not on file    Active member of club or organization: Not on file    Attends meetings of clubs or organizations: Not on file    Relationship status: Not on file  Other Topics Concern  . Not on file  Social History Narrative   Patient does not get regular exercise   Denies caffeine use    Outpatient Encounter Medications as of 07/08/2018  Medication Sig  . aspirin EC 81 MG tablet Take 1 tablet (81 mg total) by mouth daily.  Marland Kitchen atorvastatin (LIPITOR) 40 MG tablet Take 40 mg by mouth daily.  Marland Kitchen buPROPion (WELLBUTRIN XL) 150 MG 24 hr tablet Take 150-300 mg by mouth See admin instructions. Take 300 mg by mouth in the morning and take 150 mg by mouth at noon  . Cholecalciferol (VITAMIN D3) 125 MCG (5000 UT) CAPS Take 1 capsule (5,000 Units total) by mouth daily.  . diclofenac sodium (VOLTAREN) 1 % GEL Apply 2 g topically 4  (four) times daily.  Marland Kitchen diltiazem (TIAZAC) 180 MG 24 hr capsule Take 180 mg by mouth daily.  Marland Kitchen estrogens, conjugated, (PREMARIN) 1.25 MG tablet Take 1.25 mg by mouth daily.   Marland Kitchen FLUoxetine (PROZAC) 20 MG capsule Take 20 mg by mouth 2 (two) times daily.   . furosemide (LASIX) 20 MG tablet Take 20 mg by mouth daily as needed for fluid.   Marland Kitchen gabapentin (NEURONTIN) 400 MG capsule Take 400 mg by mouth 3 (three)  times daily.  . hydrOXYzine (ATARAX/VISTARIL) 10 MG tablet Take 10 mg by mouth 4 (four) times daily as needed for anxiety.  . lidocaine (XYLOCAINE) 5 % ointment Apply 1 application topically at bedtime.   . lidocaine-hydrocortisone (ANAMANTEL HC) 3-0.5 % CREA Place 1 Applicatorful rectally 2 (two) times daily for 7 days.  Marland Kitchen lisinopril (PRINIVIL,ZESTRIL) 5 MG tablet Take 1 tablet (5 mg total) by mouth daily.  Marland Kitchen LORazepam (ATIVAN) 0.5 MG tablet Take 0.5 mg by mouth 2 (two) times daily as needed for anxiety.   . meloxicam (MOBIC) 7.5 MG tablet Take 1 tablet (7.5 mg total) by mouth daily.  . metFORMIN (GLUCOPHAGE) 500 MG tablet TAKE 1 TABLET BY MOUTH TWICE DAILY  . methocarbamol (ROBAXIN) 500 MG tablet Take 1 tablet (500 mg total) by mouth 3 (three) times daily. (Patient taking differently: Take 500 mg by mouth 3 (three) times daily as needed for muscle spasms. )  . metoCLOPramide (REGLAN) 5 MG tablet Take 5 mg by mouth 3 (three) times daily as needed for nausea or vomiting.   . metoprolol succinate (TOPROL-XL) 50 MG 24 hr tablet TAKE 1 AND 1/2 TABLETS BY MOUTH EVERY DAY  . montelukast (SINGULAIR) 10 MG tablet Take 10 mg by mouth daily.  Marland Kitchen morphine (MS CONTIN) 30 MG 12 hr tablet Take 1 tablet (30 mg total) by mouth every 8 (eight) hours.  . nitroGLYCERIN (NITROSTAT) 0.4 MG SL tablet Place 1 tablet (0.4 mg total) under the tongue every 5 (five) minutes x 3 doses as needed. For chest pains  . ondansetron (ZOFRAN) 4 MG tablet Take 4 mg by mouth every 6 (six) hours as needed for nausea or vomiting.  .  pantoprazole (PROTONIX) 40 MG tablet TAKE 1 TABLET BY MOUTH TWICE DAILY BEFORE MEALS  . potassium chloride (K-DUR,KLOR-CON) 10 MEQ tablet Take 10 mEq by mouth 2 (two) times daily.  Marland Kitchen PROAIR HFA 108 (90 Base) MCG/ACT inhaler Inhale 2 puffs into the lungs every 4 (four) hours as needed for wheezing or shortness of breath.   . ranitidine (ZANTAC) 300 MG tablet Take 1 tablet (300 mg total) by mouth at bedtime as needed for heartburn. (Patient taking differently: Take 300 mg by mouth 2 (two) times daily. )  . sertraline (ZOLOFT) 100 MG tablet Take 1 tablet by mouth daily.  . sucralfate (CARAFATE) 1 g tablet Take 1 tablet (1 g total) by mouth 4 (four) times daily -  with meals and at bedtime.  . topiramate (TOPAMAX) 200 MG tablet Take 200 mg by mouth at bedtime.  Tyler Aas FLEXTOUCH 100 UNIT/ML SOPN FlexTouch Pen Inject 50 Units into the skin at bedtime.  Marland Kitchen zolpidem (AMBIEN) 10 MG tablet Take 10 mg by mouth at bedtime.   No facility-administered encounter medications on file as of 07/08/2018.    ALLERGIES: Allergies  Allergen Reactions  . Nitrofuran Derivatives Itching and Swelling  . Amphetamine-Dextroamphet Er Swelling  . Pregabalin Swelling  . Topiramate Other (See Comments)    Tongue tingle  . Verelan [Verapamil] Rash   VACCINATION STATUS:  There is no immunization history on file for this patient.  Diabetes  She presents for her follow-up diabetic visit. She has type 2 diabetes mellitus. Onset time: She was diagnosed at approximate age of 20 years. Her disease course has been worsening. There are no hypoglycemic associated symptoms. Pertinent negatives for hypoglycemia include no confusion, headaches, pallor or seizures. Pertinent negatives for diabetes include no chest pain, no fatigue, no polydipsia, no polyphagia and no polyuria.  There are no hypoglycemic complications. Symptoms are worsening. Diabetic complications include autonomic neuropathy, heart disease, peripheral neuropathy, PVD  and retinopathy. Risk factors for coronary artery disease include dyslipidemia, diabetes mellitus, obesity, hypertension and sedentary lifestyle. Current diabetic treatments: Lantus 65 units daily at bedtime, Humalog 15 units 3 times a day before meals. She mentions allergy to Victoza, Byetta, and metformin. She is following a generally unhealthy diet. When asked about meal planning, she reported none. She has not had a previous visit with a dietitian. She never participates in exercise. Her home blood glucose trend is increasing steadily. Her breakfast blood glucose range is generally 140-180 mg/dl. Her bedtime blood glucose range is generally 180-200 mg/dl. Her overall blood glucose range is 180-200 mg/dl. An ACE inhibitor/angiotensin II receptor blocker is not being taken. Eye exam is current.  Hyperlipidemia  This is a chronic problem. The current episode started more than 1 year ago. The problem is uncontrolled. Recent lipid tests were reviewed and are high. Exacerbating diseases include diabetes and obesity. Pertinent negatives include no chest pain, myalgias or shortness of breath. Current antihyperlipidemic treatment includes statins. Risk factors for coronary artery disease include diabetes mellitus, dyslipidemia, hypertension, obesity and a sedentary lifestyle.  Hypertension  This is a chronic problem. The current episode started more than 1 year ago. Pertinent negatives include no chest pain, headaches, palpitations or shortness of breath. Risk factors for coronary artery disease include diabetes mellitus, dyslipidemia, obesity and sedentary lifestyle. Hypertensive end-organ damage includes CAD/MI, PVD and retinopathy.    Review of systems: Limited as above.  Objective:    There were no vitals taken for this visit.  Wt Readings from Last 3 Encounters:  06/17/18 222 lb (100.7 kg)  05/17/18 222 lb (100.7 kg)  05/13/18 222 lb (100.7 kg)     CMP     Component Value Date/Time   NA 141  07/06/2018 0936   NA 143 11/06/2016 1112   K 4.7 07/06/2018 0936   CL 107 07/06/2018 0936   CO2 29 07/06/2018 0936   GLUCOSE 182 (H) 07/06/2018 0936   BUN 16 07/06/2018 0936   BUN 11 11/06/2016 1112   CREATININE 0.71 07/06/2018 0936   CALCIUM 8.9 07/06/2018 0936   PROT 6.0 (L) 07/06/2018 0936   PROT 5.5 (L) 11/06/2016 1112   ALBUMIN 2.9 (L) 12/07/2017 1413   ALBUMIN 3.4 (L) 11/06/2016 1112   AST 11 07/06/2018 0936   ALT 12 07/06/2018 0936   ALKPHOS 65 12/07/2017 1413   BILITOT 0.3 07/06/2018 0936   BILITOT 0.3 11/06/2016 1112   GFRNONAA 89 07/06/2018 0936   GFRAA 104 07/06/2018 0936   Diabetic Labs (most recent): Lab Results  Component Value Date   HGBA1C 8.1 (H) 07/06/2018   HGBA1C 7.1 (H) 01/27/2018   HGBA1C 8.3 (H) 09/30/2017    Lipid Panel     Component Value Date/Time   CHOL 173 07/06/2018 0936   CHOL 184 04/15/2016 1121   TRIG 109 07/06/2018 0936   HDL 62 07/06/2018 0936   HDL 77 04/15/2016 1121   CHOLHDL 2.8 07/06/2018 0936   VLDL UNABLE TO CALCULATE IF TRIGLYCERIDE OVER 400 mg/dL 02/18/2017 0528   LDLCALC 90 07/06/2018 0936     Assessment & Plan:   1. Type 2 diabetes mellitus with vascular disease ([place]) - Patient has currently uncontrolled symptomatic type 2 DM since  68 years of age. - She reports above target glycemic profile fasting between 114 and 157, postprandial between 152 and 273.  Her A1c is  8.1% increasing from 7.1% during her last visit.     Recent labs reviewed.  - Her diabetes is complicated by obesity/sedentary life, coronary artery disease status post stent placement and patient remains at a high risk for more acute and chronic complications of diabetes which include CAD, CVA, CKD, retinopathy, and neuropathy. These are all discussed in detail with the patient.  - I have counseled the patient on diet management and weight loss, by adopting a carbohydrate restricted/protein rich diet. - Patient admits there is a room for improvement in her  diet and drink choices. -  Suggestion is made for her to avoid simple carbohydrates  from her diet including Cakes, Sweet Desserts / Pastries, Ice Cream, Soda (diet and regular), Sweet Tea, Candies, Chips, Cookies, Store Bought Juices, Alcohol in Excess of  1-2 drinks a day, Artificial Sweeteners, and "Sugar-free" Products. This will help patient to have stable blood glucose profile and potentially avoid unintended weight gain. - I encouraged the patient to switch to  unprocessed or minimally processed complex starch and increased protein intake (animal or plant source), fruits, and vegetables.  - Patient is advised to stick to a routine mealtimes to eat 3 meals  a day and avoid unnecessary snacks ( to snack only to correct hypoglycemia).   - I have approached patient with the following individualized plan to manage diabetes and patient agrees:   - She has normal renal function. -She will continue to require basal insulin at least in order for her to achieve and maintain control of diabetes to target. -She is advised to increase Tresiba to 50  units nightly associated with monitoring of blood glucose at least twice daily before breakfast and anytime as needed.  - Patient is warned not to take insulin without proper monitoring per orders. -Adjustment parameters are given for hypo and hyperglycemia in writing. -Patient is encouraged to call clinic for blood glucose levels less than 70 or above 200 mg /dl. -She has intolerance to GLP-1 receptor agonists. -She is advised to  continue on metformin 500 mg p.o. twice daily.     2) BP/HTN: she is advised to home monitor blood pressure and report if > 140/90 on 2 separate readings.  She has adequate medications, advised to continue including metoprolol XL 50 mg p.o. daily.Marland Kitchen   3) Lipids/HPL: She has uncontrolled hypertriglyceridemia , still high at 439  LDL better at 50, HDL at 77. Advised her to continue  Lipitor 40 mg by mouth daily at bedtime. Better  control of diabetes will help for triglycerides.   4) Chronic Care/Health Maintenance:  -Patient is on Statin medications and encouraged to continue to follow up with Ophthalmology, Podiatrist at least yearly or according to recommendations, and advised to   stay away from smoking. I have recommended yearly flu vaccine and pneumonia vaccination at least every 5 years;  and  sleep for at least 7 hours a day. -She will benefit from a set of diabetic shoes, paperwork filled for her.  - I advised patient to maintain close follow up with Glenda Chroman, MD for primary care needs.  - Patient Care Time Today:  25 min, of which >50% was spent in reviewing her  current and  previous labs/studies, her blood glucose readings, previous treatments, and medications doses and developing a plan for long-term care based on the latest recommendations for standards of care.  [name] participated in the discussions, expressed understanding, and voiced agreement with the above plans.  All questions  were answered to her satisfaction. she is encouraged to contact clinic should she have any questions or concerns prior to her return visit.    Follow up plan: - Patient to go to emergency room for better evaluation of chest pain. Return in about 4 months (around 11/07/2018) for Meter, and Logs.  Glade Lloyd, MD Phone: [phone]  Fax: [phone]  This note was partially dictated with voice recognition software. Similar sounding words can be transcribed inadequately or may not  be corrected upon review.  07/08/2018, 4:18 PM

## 2018-07-16 ENCOUNTER — Other Ambulatory Visit: Payer: Self-pay

## 2018-07-16 ENCOUNTER — Encounter: Payer: Medicare Other | Attending: Registered Nurse | Admitting: Registered Nurse

## 2018-07-16 ENCOUNTER — Encounter: Payer: Self-pay | Admitting: Registered Nurse

## 2018-07-16 VITALS — Ht 64.0 in | Wt 219.0 lb

## 2018-07-16 DIAGNOSIS — M25562 Pain in left knee: Secondary | ICD-10-CM | POA: Insufficient documentation

## 2018-07-16 DIAGNOSIS — F411 Generalized anxiety disorder: Secondary | ICD-10-CM

## 2018-07-16 DIAGNOSIS — G894 Chronic pain syndrome: Secondary | ICD-10-CM

## 2018-07-16 DIAGNOSIS — M48062 Spinal stenosis, lumbar region with neurogenic claudication: Secondary | ICD-10-CM | POA: Diagnosis not present

## 2018-07-16 DIAGNOSIS — Z823 Family history of stroke: Secondary | ICD-10-CM | POA: Insufficient documentation

## 2018-07-16 DIAGNOSIS — F419 Anxiety disorder, unspecified: Secondary | ICD-10-CM | POA: Insufficient documentation

## 2018-07-16 DIAGNOSIS — I110 Hypertensive heart disease with heart failure: Secondary | ICD-10-CM | POA: Insufficient documentation

## 2018-07-16 DIAGNOSIS — F329 Major depressive disorder, single episode, unspecified: Secondary | ICD-10-CM

## 2018-07-16 DIAGNOSIS — M1711 Unilateral primary osteoarthritis, right knee: Secondary | ICD-10-CM | POA: Diagnosis not present

## 2018-07-16 DIAGNOSIS — I509 Heart failure, unspecified: Secondary | ICD-10-CM | POA: Insufficient documentation

## 2018-07-16 DIAGNOSIS — Z79891 Long term (current) use of opiate analgesic: Secondary | ICD-10-CM | POA: Diagnosis not present

## 2018-07-16 DIAGNOSIS — M1712 Unilateral primary osteoarthritis, left knee: Secondary | ICD-10-CM

## 2018-07-16 DIAGNOSIS — Z8249 Family history of ischemic heart disease and other diseases of the circulatory system: Secondary | ICD-10-CM | POA: Insufficient documentation

## 2018-07-16 DIAGNOSIS — G4700 Insomnia, unspecified: Secondary | ICD-10-CM | POA: Insufficient documentation

## 2018-07-16 DIAGNOSIS — M25561 Pain in right knee: Secondary | ICD-10-CM | POA: Insufficient documentation

## 2018-07-16 DIAGNOSIS — Z5181 Encounter for therapeutic drug level monitoring: Secondary | ICD-10-CM

## 2018-07-16 DIAGNOSIS — I2510 Atherosclerotic heart disease of native coronary artery without angina pectoris: Secondary | ICD-10-CM | POA: Insufficient documentation

## 2018-07-16 DIAGNOSIS — M542 Cervicalgia: Secondary | ICD-10-CM | POA: Insufficient documentation

## 2018-07-16 DIAGNOSIS — I252 Old myocardial infarction: Secondary | ICD-10-CM | POA: Insufficient documentation

## 2018-07-16 DIAGNOSIS — E785 Hyperlipidemia, unspecified: Secondary | ICD-10-CM | POA: Insufficient documentation

## 2018-07-16 DIAGNOSIS — I25118 Atherosclerotic heart disease of native coronary artery with other forms of angina pectoris: Secondary | ICD-10-CM | POA: Diagnosis not present

## 2018-07-16 DIAGNOSIS — M7062 Trochanteric bursitis, left hip: Secondary | ICD-10-CM | POA: Insufficient documentation

## 2018-07-16 DIAGNOSIS — J449 Chronic obstructive pulmonary disease, unspecified: Secondary | ICD-10-CM | POA: Insufficient documentation

## 2018-07-16 DIAGNOSIS — M7061 Trochanteric bursitis, right hip: Secondary | ICD-10-CM | POA: Insufficient documentation

## 2018-07-16 DIAGNOSIS — G4733 Obstructive sleep apnea (adult) (pediatric): Secondary | ICD-10-CM | POA: Insufficient documentation

## 2018-07-16 DIAGNOSIS — M5416 Radiculopathy, lumbar region: Secondary | ICD-10-CM

## 2018-07-16 DIAGNOSIS — K219 Gastro-esophageal reflux disease without esophagitis: Secondary | ICD-10-CM | POA: Insufficient documentation

## 2018-07-16 DIAGNOSIS — M545 Low back pain: Secondary | ICD-10-CM | POA: Insufficient documentation

## 2018-07-16 DIAGNOSIS — M4726 Other spondylosis with radiculopathy, lumbar region: Secondary | ICD-10-CM | POA: Insufficient documentation

## 2018-07-16 DIAGNOSIS — Z955 Presence of coronary angioplasty implant and graft: Secondary | ICD-10-CM | POA: Insufficient documentation

## 2018-07-16 DIAGNOSIS — E1142 Type 2 diabetes mellitus with diabetic polyneuropathy: Secondary | ICD-10-CM | POA: Insufficient documentation

## 2018-07-16 DIAGNOSIS — G8929 Other chronic pain: Secondary | ICD-10-CM | POA: Insufficient documentation

## 2018-07-16 DIAGNOSIS — M546 Pain in thoracic spine: Secondary | ICD-10-CM | POA: Insufficient documentation

## 2018-07-16 MED ORDER — MORPHINE SULFATE ER 30 MG PO TBCR: 30.0000 mg | EXTENDED_RELEASE_TABLET | Freq: Three times a day (TID) | ORAL | 0 refills | Status: DC

## 2018-07-16 NOTE — Progress Notes (Signed)
Subjective:    Patient ID: [name], female    DOB: Mar 23, 1950, 68 y.o.   MRN: [id number]  HPI: [name] is a 68 y.o. female her appointment was changed, due to national recommendations of social distancing due to [place] 19, an audio/video telehealth visit is felt to be most appropriate for this patient at this time.  See Chart message from today for the patient's consent to telehealth from [place].     She states her  pain is located in her lower back radiating into her bilateral lower extremities and bilateral feet. Also reports bilateral knee pain. . She rates her pain 7. Her current exercise regime is walking.  [name] Morphine equivalent is 90.00 MME.  She  is also prescribed Lorazepam by Noemi Chapel We have discussed the black box warning of using opioids and benzodiazepines. I highlighted the dangers of using these drugs together and discussed the adverse events including respiratory suppression, overdose, cognitive impairment and importance of compliance with current regimen. We will continue to monitor and adjust as indicated.  She  is being closely monitored and under the care of her psychiatrist Dr. Casimiro Needle.  Last UDS was Performed on 05/17/2018, it was consistent.     Kennon Rounds CMA asked the Health and History Questions. This provider and Carlena Bjornstad  verified we werespeaking with the correct person using two identifiers.   Pain Inventory Average Pain 7 Pain Right Now 7 My pain is constant, stabbing and tingling  In the last 24 hours, has pain interfered with the following? General activity 7 Relation with others 7 Enjoyment of life 7 What TIME of day is your pain at its worst? morning and daytime Sleep (in general) Fair  Pain is worse with: bending, sitting and some activites Pain improves with: pacing activities and medication Relief from Meds: 3  Mobility use a walker how many minutes can you walk? 5-10 ability  to climb steps?  no do you drive?  yes  Function disabled: date disabled na I need assistance with the following:  dressing, bathing, meal prep, household duties and shopping  Neuro/Psych weakness numbness tremor tingling trouble walking spasms confusion depression anxiety  Prior Studies Any changes since last visit?  no  Physicians involved in your care Primary care Dr. Holley Dexter   Family History  Problem Relation Age of Onset  . Heart attack Mother   . Colon cancer Mother   . Heart attack Father   . Stroke Sister   . Hemophilia Child   . Cancer Other   . Coronary artery disease Other   . Cancer Brother   . Cancer Maternal Aunt   . Cancer Maternal Uncle   . Cancer Paternal Aunt   . Cancer Paternal Uncle   . Cancer Brother    Social History   Socioeconomic History  . Marital status: Legally Separated    Spouse name: Not on file  . Number of children: 4  . Years of education: Not on file  . Highest education level: Not on file  Occupational History  . Occupation: Disabled  Social Needs  . Financial resource strain: Not on file  . Food insecurity:    Worry: Not on file    Inability: Not on file  . Transportation needs:    Medical: Not on file    Non-medical: Not on file  Tobacco Use  . Smoking status: Never Smoker  . Smokeless tobacco: Never Used  Substance  and Sexual Activity  . Alcohol use: No    Alcohol/week: 0.0 standard drinks  . Drug use: No  . Sexual activity: Not on file  Lifestyle  . Physical activity:    Days per week: Not on file    Minutes per session: Not on file  . Stress: Not on file  Relationships  . Social connections:    Talks on phone: Not on file    Gets together: Not on file    Attends religious service: Not on file    Active member of club or organization: Not on file    Attends meetings of clubs or organizations: Not on file    Relationship status: Not on file  Other Topics Concern  . Not on file  Social  History Narrative   Patient does not get regular exercise   Denies caffeine use    Past Surgical History:  Procedure Laterality Date  . ABDOMINAL HYSTERECTOMY    . Arm Surgery Bilateral    due to fall-pt broke both forearms  . BIOPSY N/A 07/01/2013   Procedure: BIOPSY;  Surgeon: Rogene Houston, MD;  [facility];  Service: Endoscopy;  Laterality: N/A;  . CHOLECYSTECTOMY    . COLONOSCOPY  08/06/2011   Procedure: COLONOSCOPY;  Surgeon: Rogene Houston, MD;  [facility];  Service: Endoscopy;  Laterality: N/A;  215  . COLONOSCOPY WITH PROPOFOL N/A 12/31/2012   Procedure: COLONOSCOPY WITH PROPOFOL;  Surgeon: Rogene Houston, MD;  Location: [facility];  Service: Endoscopy;  Laterality: N/A;  in cecum at 0814, total withdrawal time 16min  . COLONOSCOPY WITH PROPOFOL N/A 02/05/2018   Procedure: COLONOSCOPY WITH PROPOFOL;  Surgeon: Rogene Houston, MD;  [facility];  Service: Endoscopy;  Laterality: N/A;  1:25  . CORONARY ANGIOPLASTY WITH STENT PLACEMENT    . ESOPHAGEAL DILATION N/A 06/07/2015   Procedure: ESOPHAGEAL DILATION;  Surgeon: Rogene Houston, MD;  [facility];  Service: Endoscopy;  Laterality: N/A;  . ESOPHAGOGASTRODUODENOSCOPY N/A 06/07/2015   Procedure: ESOPHAGOGASTRODUODENOSCOPY (EGD);  Surgeon: Rogene Houston, MD;  [facility];  Service: Endoscopy;  Laterality: N/A;  2:45 - moved to 1:00 - Ann to notify pt  . ESOPHAGOGASTRODUODENOSCOPY (EGD) WITH PROPOFOL N/A 12/31/2012   Procedure: ESOPHAGOGASTRODUODENOSCOPY (EGD) WITH PROPOFOL;  Surgeon: Rogene Houston, MD;  [facility];  Service: Endoscopy;  Laterality: N/A;  GE junction 37,  . ESOPHAGOGASTRODUODENOSCOPY (EGD) WITH PROPOFOL N/A 07/01/2013   Procedure: ESOPHAGOGASTRODUODENOSCOPY (EGD) WITH PROPOFOL;  Surgeon: Rogene Houston, MD;  [facility];  Service: Endoscopy;  Laterality: N/A;  950  . MALONEY DILATION N/A 12/31/2012   Procedure: MALONEY DILATION;  Surgeon: Rogene Houston, MD;  [facility];  Service: Endoscopy;  Laterality: N/A;  used a # 54,#56  . MALONEY DILATION N/A 07/01/2013   Procedure: Venia Minks DILATION;  Surgeon: Rogene Houston, MD;  [facility];  Service: Endoscopy;  Laterality: N/A;  54/58; no heme present  . POLYPECTOMY  02/05/2018   Procedure: POLYPECTOMY;  Surgeon: Rogene Houston, MD;  [facility];  Service: Endoscopy;;  distal rectum (HS)   Past Medical History:  Diagnosis Date  . Anxiety and depression   . Arthritis   . CAD (coronary artery disease)    Stent placement circumflex coronary 2007, catheterization 2008 patent stents. Normal LV function  . Chest pain   . CHF (congestive heart failure) ([place])   . COPD (chronic obstructive pulmonary disease) (  [place])    no home O2  . Depression   . Diabetes mellitus    Insulin dependent  . Diabetic polyneuropathy (HCC)     severe on multiple medications  . Dyslipidemia   . GERD (gastroesophageal reflux disease)   . Headache(784.0)   . 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 Hypertension   . MI (myocardial infarction) ([place])    2007  . Obstructive sleep apnea    CPAP, setting ?2  . Palpitations   . Tachycardia    Ht 5\' 4"  (1.626 m)   Wt 219 lb (99.3 kg)   BMI 37.59 kg/m   Opioid Risk Score:   Fall Risk Score:  `1  Depression screen PHQ 2/9  Depression screen [facility] 2/9 07/16/2018 06/12/2017 04/14/2017 02/17/2017 01/14/2017 12/24/2016 11/11/2016  Decreased Interest 1 3 3  0 3 0 0  Down, Depressed, Hopeless 1 3 3  0 3 0 0  PHQ - 2 Score 2 6 6  0 6 0 0  Altered sleeping - 3 - - - - -  Tired, decreased energy - 3 - - - - -  Change in appetite - 0 - - - - -  Feeling bad or failure about yourself  - 3 - - - - -  Trouble concentrating - 3 - - - - -  Moving slowly or fidgety/restless - 1 - - - - -  Suicidal thoughts - 0 - - - - -  PHQ-9 Score - 19 - - - - -  Difficult doing work/chores - Very difficult - - - - -  Some recent data might be hidden    Review of Systems   Constitutional: Negative.   HENT: Negative.   Eyes: Negative.   Respiratory: Negative.   Cardiovascular: Negative.   Gastrointestinal: Negative.   Endocrine: Negative.   Genitourinary: Negative.   Musculoskeletal: Positive for back pain.       Leg pain Shoulder pain spasms  Skin: Negative.   Allergic/Immunologic: Negative.   Neurological: Positive for tremors, weakness and numbness.       Tingling  Hematological: Negative.   Psychiatric/Behavioral: Positive for dysphoric mood.  All other systems reviewed and are negative.      Objective:   Physical Exam Vitals signs and nursing note reviewed.  Musculoskeletal:     Comments: No Physical Exam Performed: Virtual Visit  Neurological:     Mental Status: She is oriented to person, place, and time.           Assessment & Plan:  1.Lumbar pain lumbar spondylosis: Encouraged to continue toincrease Activity as tolerated. 07/16/2018. Continue current medication regimen.Refilled: MS Contin 30 MG one tablet every 8 hours #90. We will continue the opioid monitoring program, this consists of regular clinic visits, examinations, urine drug screen, pill counts as well as use of [place] Controlled Substance Reporting System. 2. Lumbar Radiculitis: Continuecurrent medication regimen withGabapentin.07/16/2018 3. Bilateral Knee Pain/ Degenerative: Continue Voltaren Gel. Continue to Monitor.07/16/2018 4. Severe diabetic poly neuropathy: Continuecurrent medication regimen withGabapentin.07/16/2018 5. Insomnia: ContinueTrazodone PsychiatryFollowing.07/16/2018 6.Anxiety/Depression:Lorazepam,ProzacandHydroxyzinePrescribed byPsychiatry.07/16/2018 7. De Quervains Tenosynovitis: No Complaints Today.07/16/2018 8. Bilateral Greater Trochanteric Bursitis: Continue with Ice and Heat Therapy. Continue to Monitor.07/16/2018. 9. Cervicalgia/ Cervical Radiculitis:No complaints today.Continue with HEP as Tolerated and continue  to monitor.06/17/2018 10. Chronic Bilateral Thoracic Back Pain: Continue HEP as Tolerated. Continue current medication regimen. Continue to monitor.07/16/2018.  F/U in 1 month Telephone Call  Location of patient: In her Home Location of provider: Office Established patient Time spent on call: 10 Minutes

## 2018-07-20 ENCOUNTER — Telehealth: Payer: Self-pay

## 2018-07-20 DIAGNOSIS — E1159 Type 2 diabetes mellitus with other circulatory complications: Secondary | ICD-10-CM

## 2018-07-20 MED ORDER — TRESIBA FLEXTOUCH 100 UNIT/ML ~~LOC~~ SOPN: 50.0000 [IU] | PEN_INJECTOR | Freq: Every day | SUBCUTANEOUS | 2 refills | Status: DC

## 2018-07-20 NOTE — Telephone Encounter (Signed)
[name], CMA  

## 2018-08-02 DIAGNOSIS — G4730 Sleep apnea, unspecified: Secondary | ICD-10-CM | POA: Diagnosis not present

## 2018-08-02 DIAGNOSIS — F331 Major depressive disorder, recurrent, moderate: Secondary | ICD-10-CM | POA: Diagnosis not present

## 2018-08-11 ENCOUNTER — Other Ambulatory Visit: Payer: Self-pay

## 2018-08-11 ENCOUNTER — Encounter (HOSPITAL_BASED_OUTPATIENT_CLINIC_OR_DEPARTMENT_OTHER): Payer: Medicare Other | Admitting: Registered Nurse

## 2018-08-11 ENCOUNTER — Encounter: Payer: Self-pay | Admitting: Registered Nurse

## 2018-08-11 VITALS — Ht 64.0 in | Wt 219.0 lb

## 2018-08-11 DIAGNOSIS — M48062 Spinal stenosis, lumbar region with neurogenic claudication: Secondary | ICD-10-CM | POA: Diagnosis not present

## 2018-08-11 DIAGNOSIS — G894 Chronic pain syndrome: Secondary | ICD-10-CM | POA: Diagnosis not present

## 2018-08-11 DIAGNOSIS — M546 Pain in thoracic spine: Secondary | ICD-10-CM

## 2018-08-11 DIAGNOSIS — Z79891 Long term (current) use of opiate analgesic: Secondary | ICD-10-CM

## 2018-08-11 DIAGNOSIS — G8929 Other chronic pain: Secondary | ICD-10-CM

## 2018-08-11 DIAGNOSIS — F411 Generalized anxiety disorder: Secondary | ICD-10-CM | POA: Diagnosis not present

## 2018-08-11 DIAGNOSIS — M1712 Unilateral primary osteoarthritis, left knee: Secondary | ICD-10-CM | POA: Diagnosis not present

## 2018-08-11 DIAGNOSIS — Z5181 Encounter for therapeutic drug level monitoring: Secondary | ICD-10-CM | POA: Diagnosis not present

## 2018-08-11 DIAGNOSIS — E1142 Type 2 diabetes mellitus with diabetic polyneuropathy: Secondary | ICD-10-CM

## 2018-08-11 DIAGNOSIS — I25118 Atherosclerotic heart disease of native coronary artery with other forms of angina pectoris: Secondary | ICD-10-CM

## 2018-08-11 DIAGNOSIS — M1711 Unilateral primary osteoarthritis, right knee: Secondary | ICD-10-CM | POA: Diagnosis not present

## 2018-08-11 DIAGNOSIS — M7061 Trochanteric bursitis, right hip: Secondary | ICD-10-CM | POA: Diagnosis not present

## 2018-08-11 DIAGNOSIS — M5416 Radiculopathy, lumbar region: Secondary | ICD-10-CM

## 2018-08-11 DIAGNOSIS — F329 Major depressive disorder, single episode, unspecified: Secondary | ICD-10-CM

## 2018-08-11 MED ORDER — MORPHINE SULFATE ER 30 MG PO TBCR: 30.0000 mg | EXTENDED_RELEASE_TABLET | Freq: Three times a day (TID) | ORAL | 0 refills | Status: DC

## 2018-08-11 NOTE — Progress Notes (Signed)
Subjective:    Patient ID: [name], female    DOB: 09-24-1950, 68 y.o.   MRN: [id number]  HPI: [name] is a 68 y.o. female her appointment was changed, due to national recommendations of social distancing due to [place] 19, an audio/video telehealth visit is felt to be most appropriate for this patient at this time.  See Chart message from today for the patient's consent to telehealth from [place].     She states her pain is located in her upper- lower back pain radiating into her bilateral lower extremities, right hip,bilateral knees and bilateral feet pain with tingling and burning. She rates her pain 5. Her current exercise regime is walking and performing stretching exercises.  [name] Morphine equivalent is 90.00 MME. She  is also prescribed Lorazepam  by Noemi Chapel. We have discussed the black box warning of using opioids and benzodiazepines. I highlighted the dangers of using these drugs together and discussed the adverse events including respiratory suppression, overdose, cognitive impairment and importance of compliance with current regimen. We will continue to monitor and adjust as indicated.  She  is being closely monitored and under the care of her psychiatrist Dr. Casimiro Needle.   Last UDS was Performed on 05/17/2018, it was consistent.   Geryl Rankins CMA asked the Health and History Questions. This provider and Mancel Parsons verified we were  speaking with the correct person using two identifiers.   Pain Inventory Average Pain 7 Pain Right Now 5 My pain is constant, burning and stabbing  In the last 24 hours, has pain interfered with the following? General activity 9 Relation with others 5 Enjoyment of life 5 What TIME of day is your pain at its worst? evening Sleep (in general) varies  Pain is worse with: walking, bending, standing and some activites Pain improves with: heat/ice and medication Relief from Meds: 9   Mobility walk with assistance use a cane use a walker how many minutes can you walk? 2-3 ability to climb steps?  no do you drive?  yes  Function disabled: date disabled .  Neuro/Psych weakness numbness tremor tingling trouble walking depression anxiety  Prior Studies Any changes since last visit?  no  Physicians involved in your care Any changes since last visit?  no   Family History  Problem Relation Age of Onset  . Heart attack Mother   . Colon cancer Mother   . Heart attack Father   . Stroke Sister   . Hemophilia Child   . Cancer Other   . Coronary artery disease Other   . Cancer Brother   . Cancer Maternal Aunt   . Cancer Maternal Uncle   . Cancer Paternal Aunt   . Cancer Paternal Uncle   . Cancer Brother    Social History   Socioeconomic History  . Marital status: Legally Separated    Spouse name: Not on file  . Number of children: 4  . Years of education: Not on file  . Highest education level: Not on file  Occupational History  . Occupation: Disabled  Social Needs  . Financial resource strain: Not on file  . Food insecurity:    Worry: Not on file    Inability: Not on file  . Transportation needs:    Medical: Not on file    Non-medical: Not on file  Tobacco Use  . Smoking status: Never Smoker  . Smokeless tobacco: Never Used  Substance and Sexual Activity  .  Alcohol use: No    Alcohol/week: 0.0 standard drinks  . Drug use: No  . Sexual activity: Not on file  Lifestyle  . Physical activity:    Days per week: Not on file    Minutes per session: Not on file  . Stress: Not on file  Relationships  . Social connections:    Talks on phone: Not on file    Gets together: Not on file    Attends religious service: Not on file    Active member of club or organization: Not on file    Attends meetings of clubs or organizations: Not on file    Relationship status: Not on file  Other Topics Concern  . Not on file  Social History Narrative    Patient does not get regular exercise   Denies caffeine use    Past Surgical History:  Procedure Laterality Date  . ABDOMINAL HYSTERECTOMY    . Arm Surgery Bilateral    due to fall-pt broke both forearms  . BIOPSY N/A 07/01/2013   Procedure: BIOPSY;  Surgeon: Rogene Houston, MD;  [facility];  Service: Endoscopy;  Laterality: N/A;  . CHOLECYSTECTOMY    . COLONOSCOPY  08/06/2011   Procedure: COLONOSCOPY;  Surgeon: Rogene Houston, MD;  [facility];  Service: Endoscopy;  Laterality: N/A;  215  . COLONOSCOPY WITH PROPOFOL N/A 12/31/2012   Procedure: COLONOSCOPY WITH PROPOFOL;  Surgeon: Rogene Houston, MD;  [facility]: [facility];  Service: Endoscopy;  Laterality: N/A;  in cecum at 0814, total withdrawal time 45mn  . COLONOSCOPY WITH PROPOFOL N/A 02/05/2018   Procedure: COLONOSCOPY WITH PROPOFOL;  Surgeon: RRogene Houston MD;  [facility];  Service: Endoscopy;  Laterality: N/A;  1:25  . CORONARY ANGIOPLASTY WITH STENT PLACEMENT    . ESOPHAGEAL DILATION N/A 06/07/2015   Procedure: ESOPHAGEAL DILATION;  Surgeon: NRogene Houston MD;  [facility];  Service: Endoscopy;  Laterality: N/A;  . ESOPHAGOGASTRODUODENOSCOPY N/A 06/07/2015   Procedure: ESOPHAGOGASTRODUODENOSCOPY (EGD);  Surgeon: NRogene Houston MD;  [facility];  Service: Endoscopy;  Laterality: N/A;  2:45 - moved to 1:00 - Ann to notify pt  . ESOPHAGOGASTRODUODENOSCOPY (EGD) WITH PROPOFOL N/A 12/31/2012   Procedure: ESOPHAGOGASTRODUODENOSCOPY (EGD) WITH PROPOFOL;  Surgeon: NRogene Houston MD;  [facility];  Service: Endoscopy;  Laterality: N/A;  GE junction 37,  . ESOPHAGOGASTRODUODENOSCOPY (EGD) WITH PROPOFOL N/A 07/01/2013   Procedure: ESOPHAGOGASTRODUODENOSCOPY (EGD) WITH PROPOFOL;  Surgeon: NRogene Houston MD;  [facility];  Service: Endoscopy;  Laterality: N/A;  950  . MALONEY DILATION N/A 12/31/2012   Procedure: MALONEY DILATION;  Surgeon: NRogene Houston MD;  [facility];  Service: Endoscopy;  Laterality: N/A;  used a # 54,#56  . MALONEY DILATION N/A 07/01/2013   Procedure: MVenia MinksDILATION;  Surgeon: NRogene Houston MD;  [facility];  Service: Endoscopy;  Laterality: N/A;  54/58; no heme present  . POLYPECTOMY  02/05/2018   Procedure: POLYPECTOMY;  Surgeon: RRogene Houston MD;  [facility];  Service: Endoscopy;;  distal rectum (HS)   Past Medical History:  Diagnosis Date  . Anxiety and depression   . Arthritis   . CAD (coronary artery disease)    Stent placement circumflex coronary 2007, catheterization 2008 patent stents. Normal LV function  . Chest pain   . [place]   . [place]    [place]  [place] O2  . Depression   . Diabetes mellitus    Insulin dependent  . Diabetic polyneuropathy (HCC)     severe on multiple medications  . Dyslipidemia   . GERD (gastroesophageal reflux disease)   . Headache(784.0)   . 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 Hypertension   . MI (myocardial infarction) ([place])    2007  . Obstructive sleep apnea    CPAP, setting ?2  . Palpitations   . Tachycardia    Ht 5' 4"  (1.626 m)   Wt 219 lb (99.3 kg)   BMI 37.59 kg/m   Opioid Risk Score:   Fall Risk Score:  `1  Depression screen PHQ 2/9  Depression screen [facility] 2/9 07/16/2018 06/12/2017 04/14/2017 02/17/2017 01/14/2017 12/24/2016 11/11/2016  Decreased Interest 1 3 3  0 3 0 0  Down, Depressed, Hopeless 1 3 3  0 3 0 0  PHQ - 2 Score 2 6 6  0 6 0 0  Altered sleeping - 3 - - - - -  Tired, decreased energy - 3 - - - - -  Change in appetite - 0 - - - - -  Feeling bad or failure about yourself  - 3 - - - - -  Trouble concentrating - 3 - - - - -  Moving slowly or fidgety/restless - 1 - - - - -  Suicidal thoughts - 0 - - - - -  PHQ-9 Score - 19 - - - - -  Difficult doing work/chores - Very difficult - - - - -  Some recent data might be hidden    Review of Systems  Constitutional: Negative.   HENT:  Negative.   Eyes: Negative.   Respiratory: Negative.   Gastrointestinal: Negative.   Endocrine: Negative.   Genitourinary: Negative.   Musculoskeletal: Positive for arthralgias, back pain, gait problem, neck pain and neck stiffness.  Skin: Negative.   Neurological: Positive for tremors, weakness, numbness and headaches.       Tingling  Psychiatric/Behavioral: Positive for dysphoric mood. The patient is nervous/anxious.   All other systems reviewed and are negative.      Objective:   Physical Exam Vitals signs and nursing note reviewed.  Musculoskeletal:     Comments: No Physical Exam Performed: Virtual Visit  Neurological:     Mental Status: She is oriented to person, place, and time.           Assessment & Plan:  1.Lumbar pain lumbar spondylosis: Encouraged to continue toincrease Activity as tolerated. 08/11/2018. Continue current medication regimen.Refilled: MS Contin 30 MG one tablet every 8 hours #90. We will continue the opioid monitoring program, this consists of regular clinic visits, examinations, urine drug screen, pill counts as well as use of [place] Controlled Substance Reporting System. 2. Lumbar Radiculitis: Continuecurrent medication regimen withGabapentin.05/272020 3. Bilateral Knee Pain/ Degenerative: Continue Voltaren Gel. Continue to Monitor.08/11/2018 4. Severe diabetic poly neuropathy: Continuecurrent medication regimen withGabapentin.08/11/2018 5. Insomnia: ContinueTrazodone PsychiatryFollowing.08/11/2018 6.Anxiety/Depression:Lorazepam,ProzacandHydroxyzinePrescribed byPsychiatry.08/11/2018 7. De Quervains Tenosynovitis: No Complaints Today.08/11/2018 8. Right Greater Trochanteric Bursitis: Continue with Ice and Heat Therapy. Continue to Monitor.08/11/2018. 9. Cervicalgia/ Cervical Radiculitis:No complaints today.Continue with HEP as Tolerated and continue to monitor.08/11/2018 10. Chronic Bilateral Thoracic Back Pain:  Continue HEP as Tolerated. Continue current medication regimen. Continue to monitor.08/11/2018.   Telephone Call Location of patient: In her Home Location of provider: Office Established patient Time spent on call: 10 Minutes

## 2018-08-19 DIAGNOSIS — Z299 Encounter for prophylactic measures, unspecified: Secondary | ICD-10-CM | POA: Diagnosis not present

## 2018-08-19 DIAGNOSIS — Z713 Dietary counseling and surveillance: Secondary | ICD-10-CM | POA: Diagnosis not present

## 2018-08-19 DIAGNOSIS — I10 Essential (primary) hypertension: Secondary | ICD-10-CM | POA: Diagnosis not present

## 2018-08-19 DIAGNOSIS — K120 Recurrent oral aphthae: Secondary | ICD-10-CM | POA: Diagnosis not present

## 2018-08-19 DIAGNOSIS — E1165 Type 2 diabetes mellitus with hyperglycemia: Secondary | ICD-10-CM | POA: Diagnosis not present

## 2018-08-25 DIAGNOSIS — D490 Neoplasm of unspecified behavior of digestive system: Secondary | ICD-10-CM | POA: Diagnosis not present

## 2018-09-05 ENCOUNTER — Other Ambulatory Visit: Payer: Self-pay | Admitting: Cardiovascular Disease

## 2018-09-05 ENCOUNTER — Other Ambulatory Visit: Payer: Self-pay | Admitting: "Endocrinology

## 2018-09-10 ENCOUNTER — Other Ambulatory Visit: Payer: Self-pay

## 2018-09-10 ENCOUNTER — Encounter: Payer: Self-pay | Admitting: Registered Nurse

## 2018-09-10 ENCOUNTER — Encounter: Payer: Medicare Other | Attending: Registered Nurse | Admitting: Registered Nurse

## 2018-09-10 VITALS — BP 157/77 | HR 65 | Temp 97.2°F | Ht 67.0 in | Wt 231.0 lb

## 2018-09-10 DIAGNOSIS — Z7982 Long term (current) use of aspirin: Secondary | ICD-10-CM | POA: Diagnosis not present

## 2018-09-10 DIAGNOSIS — I2510 Atherosclerotic heart disease of native coronary artery without angina pectoris: Secondary | ICD-10-CM | POA: Insufficient documentation

## 2018-09-10 DIAGNOSIS — E119 Type 2 diabetes mellitus without complications: Secondary | ICD-10-CM | POA: Diagnosis not present

## 2018-09-10 DIAGNOSIS — F411 Generalized anxiety disorder: Secondary | ICD-10-CM | POA: Diagnosis not present

## 2018-09-10 DIAGNOSIS — M542 Cervicalgia: Secondary | ICD-10-CM | POA: Insufficient documentation

## 2018-09-10 DIAGNOSIS — M25561 Pain in right knee: Secondary | ICD-10-CM | POA: Diagnosis not present

## 2018-09-10 DIAGNOSIS — J449 Chronic obstructive pulmonary disease, unspecified: Secondary | ICD-10-CM | POA: Insufficient documentation

## 2018-09-10 DIAGNOSIS — M4726 Other spondylosis with radiculopathy, lumbar region: Secondary | ICD-10-CM | POA: Diagnosis not present

## 2018-09-10 DIAGNOSIS — G894 Chronic pain syndrome: Secondary | ICD-10-CM | POA: Diagnosis not present

## 2018-09-10 DIAGNOSIS — M545 Low back pain: Secondary | ICD-10-CM | POA: Diagnosis not present

## 2018-09-10 DIAGNOSIS — M7062 Trochanteric bursitis, left hip: Secondary | ICD-10-CM | POA: Insufficient documentation

## 2018-09-10 DIAGNOSIS — K219 Gastro-esophageal reflux disease without esophagitis: Secondary | ICD-10-CM | POA: Diagnosis not present

## 2018-09-10 DIAGNOSIS — M1711 Unilateral primary osteoarthritis, right knee: Secondary | ICD-10-CM | POA: Diagnosis not present

## 2018-09-10 DIAGNOSIS — F329 Major depressive disorder, single episode, unspecified: Secondary | ICD-10-CM | POA: Diagnosis not present

## 2018-09-10 DIAGNOSIS — G4700 Insomnia, unspecified: Secondary | ICD-10-CM | POA: Diagnosis not present

## 2018-09-10 DIAGNOSIS — E1142 Type 2 diabetes mellitus with diabetic polyneuropathy: Secondary | ICD-10-CM | POA: Diagnosis not present

## 2018-09-10 DIAGNOSIS — M546 Pain in thoracic spine: Secondary | ICD-10-CM | POA: Insufficient documentation

## 2018-09-10 DIAGNOSIS — Z955 Presence of coronary angioplasty implant and graft: Secondary | ICD-10-CM | POA: Diagnosis not present

## 2018-09-10 DIAGNOSIS — M7061 Trochanteric bursitis, right hip: Secondary | ICD-10-CM | POA: Diagnosis not present

## 2018-09-10 DIAGNOSIS — Z9049 Acquired absence of other specified parts of digestive tract: Secondary | ICD-10-CM | POA: Diagnosis not present

## 2018-09-10 DIAGNOSIS — Z823 Family history of stroke: Secondary | ICD-10-CM | POA: Diagnosis not present

## 2018-09-10 DIAGNOSIS — I252 Old myocardial infarction: Secondary | ICD-10-CM | POA: Diagnosis not present

## 2018-09-10 DIAGNOSIS — M25562 Pain in left knee: Secondary | ICD-10-CM | POA: Insufficient documentation

## 2018-09-10 DIAGNOSIS — E785 Hyperlipidemia, unspecified: Secondary | ICD-10-CM | POA: Diagnosis not present

## 2018-09-10 DIAGNOSIS — Z8249 Family history of ischemic heart disease and other diseases of the circulatory system: Secondary | ICD-10-CM | POA: Insufficient documentation

## 2018-09-10 DIAGNOSIS — I110 Hypertensive heart disease with heart failure: Secondary | ICD-10-CM | POA: Insufficient documentation

## 2018-09-10 DIAGNOSIS — G4733 Obstructive sleep apnea (adult) (pediatric): Secondary | ICD-10-CM | POA: Diagnosis not present

## 2018-09-10 DIAGNOSIS — Z794 Long term (current) use of insulin: Secondary | ICD-10-CM | POA: Diagnosis not present

## 2018-09-10 DIAGNOSIS — M1712 Unilateral primary osteoarthritis, left knee: Secondary | ICD-10-CM

## 2018-09-10 DIAGNOSIS — I25118 Atherosclerotic heart disease of native coronary artery with other forms of angina pectoris: Secondary | ICD-10-CM

## 2018-09-10 DIAGNOSIS — F419 Anxiety disorder, unspecified: Secondary | ICD-10-CM | POA: Insufficient documentation

## 2018-09-10 DIAGNOSIS — Z90710 Acquired absence of both cervix and uterus: Secondary | ICD-10-CM | POA: Diagnosis not present

## 2018-09-10 DIAGNOSIS — M48062 Spinal stenosis, lumbar region with neurogenic claudication: Secondary | ICD-10-CM | POA: Diagnosis not present

## 2018-09-10 DIAGNOSIS — Z79899 Other long term (current) drug therapy: Secondary | ICD-10-CM | POA: Diagnosis not present

## 2018-09-10 DIAGNOSIS — C20 Malignant neoplasm of rectum: Secondary | ICD-10-CM | POA: Diagnosis not present

## 2018-09-10 DIAGNOSIS — Z79891 Long term (current) use of opiate analgesic: Secondary | ICD-10-CM

## 2018-09-10 DIAGNOSIS — M5416 Radiculopathy, lumbar region: Secondary | ICD-10-CM | POA: Diagnosis not present

## 2018-09-10 DIAGNOSIS — Z5181 Encounter for therapeutic drug level monitoring: Secondary | ICD-10-CM | POA: Diagnosis not present

## 2018-09-10 DIAGNOSIS — I509 Heart failure, unspecified: Secondary | ICD-10-CM | POA: Insufficient documentation

## 2018-09-10 DIAGNOSIS — E7800 Pure hypercholesterolemia, unspecified: Secondary | ICD-10-CM | POA: Diagnosis not present

## 2018-09-10 DIAGNOSIS — G8929 Other chronic pain: Secondary | ICD-10-CM | POA: Insufficient documentation

## 2018-09-10 DIAGNOSIS — G4730 Sleep apnea, unspecified: Secondary | ICD-10-CM | POA: Diagnosis not present

## 2018-09-10 DIAGNOSIS — Z1159 Encounter for screening for other viral diseases: Secondary | ICD-10-CM | POA: Diagnosis not present

## 2018-09-10 MED ORDER — MORPHINE SULFATE ER 30 MG PO TBCR: 30.0000 mg | EXTENDED_RELEASE_TABLET | Freq: Three times a day (TID) | ORAL | 0 refills | Status: DC

## 2018-09-10 NOTE — Progress Notes (Signed)
Subjective:    Patient ID: [name], female    DOB: 06/04/1950, 68 y.o.   MRN: [id number]  HPI: [name] is a 68 y.o. female who returns for follow up appointment for chronic pain and medication refill. She states her pain is located in her neck, lower back radiating into her right hip and bilateral lower extremities and bilateral knee pain. She rates her pain 5. Her current exercise regime is walking and performing stretching exercises.  [name] Morphine equivalent is 90.00MME.  She  is also prescribed Lorazepam by [employer].We have discussed the black box warning of using opioids and benzodiazepines. I highlighted the dangers of using these drugs together and discussed the adverse events including respiratory suppression, overdose, cognitive impairment and importance of compliance with current regimen. We will continue to monitor and adjust as indicated.  She  is being closely monitored and under the care of her psychiatrist Dr. Reece Hopkins.  Last UDS was Performed on 05/17/2018, it was consistent.   Pain Inventory Average Pain 7 Pain Right Now 5 My pain is sharp, burning, stabbing, tingling and aching  In the last 24 hours, has pain interfered with the following? General activity 7 Relation with others 6 Enjoyment of life 6 What TIME of day is your pain at its worst? night Sleep (in general) Poor  Pain is worse with: walking, bending, sitting, standing and some activites Pain improves with: rest, heat/ice and medication Relief from Meds: 2  Mobility walk with assistance use a walker ability to climb steps?  no do you drive?  no  Function retired I need assistance with the following:  dressing, bathing, meal prep, household duties and shopping  Neuro/Psych bladder control problems bowel control problems weakness trouble walking dizziness confusion depression anxiety  Prior Studies Any changes since last visit?  no  Physicians involved in your care  Any changes since last visit?  no   Family History  Problem Relation Age of Onset  . Heart attack Mother   . Colon cancer Mother   . Heart attack Father   . Stroke Sister   . Hemophilia Child   . Cancer Other   . Coronary artery disease Other   . Cancer Brother   . Cancer Maternal Aunt   . Cancer Maternal Uncle   . Cancer Paternal Aunt   . Cancer Paternal Uncle   . Cancer Brother    Social History   Socioeconomic History  . Marital status: Legally Separated    Spouse name: Not on file  . Number of children: 4  . Years of education: Not on file  . Highest education level: Not on file  Occupational History  . Occupation: Disabled  Social Needs  . Financial resource strain: Not on file  . Food insecurity    Worry: Not on file    Inability: Not on file  . Transportation needs    Medical: Not on file    Non-medical: Not on file  Tobacco Use  . Smoking status: Never Smoker  . Smokeless tobacco: Never Used  Substance and Sexual Activity  . Alcohol use: No    Alcohol/week: 0.0 standard drinks  . Drug use: No  . Sexual activity: Not on file  Lifestyle  . Physical activity    Days per week: Not on file    Minutes per session: Not on file  . Stress: Not on file  Relationships  . Social connections    Talks on phone: Not on  file    Gets together: Not on file    Attends religious service: Not on file    Active member of club or organization: Not on file    Attends meetings of clubs or organizations: Not on file    Relationship status: Not on file  Other Topics Concern  . Not on file  Social History Narrative   Patient does not get regular exercise   Denies caffeine use    Past Surgical History:  Procedure Laterality Date  . ABDOMINAL HYSTERECTOMY    . Arm Surgery Bilateral    due to fall-pt broke both forearms  . BIOPSY N/A 07/01/2013   Procedure: BIOPSY;  Surgeon: Rogene Houston, MD;  [facility]: [facility];  Service: Endoscopy;  Laterality: N/A;  .  CHOLECYSTECTOMY    . COLONOSCOPY  08/06/2011   Procedure: COLONOSCOPY;  Surgeon: Rogene Houston, MD;  [facility];  Service: Endoscopy;  Laterality: N/A;  215  . COLONOSCOPY WITH PROPOFOL N/A 12/31/2012   Procedure: COLONOSCOPY WITH PROPOFOL;  Surgeon: Rogene Houston, MD;  Location: [facility];  Service: Endoscopy;  Laterality: N/A;  in cecum at 0814, total withdrawal time 61min  . COLONOSCOPY WITH PROPOFOL N/A 02/05/2018   Procedure: COLONOSCOPY WITH PROPOFOL;  Surgeon: Rogene Houston, MD;  [facility];  Service: Endoscopy;  Laterality: N/A;  1:25  . CORONARY ANGIOPLASTY WITH STENT PLACEMENT    . ESOPHAGEAL DILATION N/A 06/07/2015   Procedure: ESOPHAGEAL DILATION;  Surgeon: Rogene Houston, MD;  [facility];  Service: Endoscopy;  Laterality: N/A;  . ESOPHAGOGASTRODUODENOSCOPY N/A 06/07/2015   Procedure: ESOPHAGOGASTRODUODENOSCOPY (EGD);  Surgeon: Rogene Houston, MD;  [facility];  Service: Endoscopy;  Laterality: N/A;  2:45 - moved to 1:00 - Ann to notify pt  . ESOPHAGOGASTRODUODENOSCOPY (EGD) WITH PROPOFOL N/A 12/31/2012   Procedure: ESOPHAGOGASTRODUODENOSCOPY (EGD) WITH PROPOFOL;  Surgeon: Rogene Houston, MD;  [facility];  Service: Endoscopy;  Laterality: N/A;  GE junction 37,  . ESOPHAGOGASTRODUODENOSCOPY (EGD) WITH PROPOFOL N/A 07/01/2013   Procedure: ESOPHAGOGASTRODUODENOSCOPY (EGD) WITH PROPOFOL;  Surgeon: Rogene Houston, MD;  [facility];  Service: Endoscopy;  Laterality: N/A;  950  . MALONEY DILATION N/A 12/31/2012   Procedure: MALONEY DILATION;  Surgeon: Rogene Houston, MD;  [facility];  Service: Endoscopy;  Laterality: N/A;  used a # 54,#56  . MALONEY DILATION N/A 07/01/2013   Procedure: Venia Minks DILATION;  Surgeon: Rogene Houston, MD;  [facility];  Service: Endoscopy;  Laterality: N/A;  54/58; no heme present  . POLYPECTOMY  02/05/2018   Procedure: POLYPECTOMY;  Surgeon: Rogene Houston, MD;  [facility];  Service: Endoscopy;;  distal rectum (HS)   Past Medical History:  Diagnosis Date  . Anxiety and depression   . Arthritis   . CAD (coronary artery disease)    Stent placement circumflex coronary 2007, catheterization 2008 patent stents. Normal LV function  . Chest pain   . CHF (congestive heart failure) ([place])   . COPD (chronic obstructive pulmonary disease) ([facility])    no home O2  . Depression   . Diabetes mellitus    Insulin dependent  . Diabetic polyneuropathy (HCC)     severe on multiple medications  . Dyslipidemia   . GERD (gastroesophageal reflux disease)   . Headache(784.0)   . 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 Hypertension   . MI (myocardial infarction) ([place])    2007  . Obstructive  sleep apnea    CPAP, setting ?2  . Palpitations   . Tachycardia    There were no vitals taken for this visit.  Opioid Risk Score:   Fall Risk Score:  `1  Depression screen PHQ 2/9  Depression screen [facility] 2/9 07/16/2018 06/12/2017 04/14/2017 02/17/2017 01/14/2017 12/24/2016 11/11/2016  Decreased Interest 1 3 3  0 3 0 0  Down, Depressed, Hopeless 1 3 3  0 3 0 0  PHQ - 2 Score 2 6 6  0 6 0 0  Altered sleeping - 3 - - - - -  Tired, decreased energy - 3 - - - - -  Change in appetite - 0 - - - - -  Feeling bad or failure about yourself  - 3 - - - - -  Trouble concentrating - 3 - - - - -  Moving slowly or fidgety/restless - 1 - - - - -  Suicidal thoughts - 0 - - - - -  PHQ-9 Score - 19 - - - - -  Difficult doing work/chores - Very difficult - - - - -  Some recent data might be hidden     Review of Systems  Constitutional: Negative.   HENT: Negative.   Eyes: Negative.   Respiratory: Negative.   Cardiovascular: Negative.   Gastrointestinal: Negative.   Endocrine: Negative.   Genitourinary: Positive for difficulty urinating.  Musculoskeletal: Positive for gait problem.  Skin: Negative.   Allergic/Immunologic: Negative.   Neurological: Positive for dizziness and weakness.  Hematological:  Negative.   Psychiatric/Behavioral: Positive for confusion and dysphoric mood. The patient is nervous/anxious.   All other systems reviewed and are negative.      Objective:   Physical Exam Vitals signs and nursing note reviewed.  Constitutional:      Appearance: Normal appearance. She is obese.  Neck:     Musculoskeletal: Normal range of motion and neck supple.     Comments: Cervical Paraspinal Tenderness: C-5-C-6 Cardiovascular:     Rate and Rhythm: Normal rate and regular rhythm.     Pulses: Normal pulses.     Heart sounds: Normal heart sounds.  Pulmonary:     Effort: Pulmonary effort is normal.     Breath sounds: Normal breath sounds.  Musculoskeletal:     Comments: Normal Muscle Bulk and Muscle Testing Reveals:  Upper Extremities: Full ROM and Muscle Strength 5/5 Thoracic Paraspinal Tenderness: T-7-T-9  Lumbar Paraspinal Tenderness: L-4-L-5 Lower Extremities: Full ROM and Muscle Strength 5/5 Arises from Table with ease Narrow Based Gait   Skin:    General: Skin is warm and dry.  Neurological:     Mental Status: She is alert and oriented to person, place, and time.  Psychiatric:        Mood and Affect: Mood normal.        Behavior: Behavior normal.           Assessment & Plan:  1.Lumbar pain lumbar spondylosis: Encouraged to continue toincrease Activity as tolerated. 09/10/2018. Continue current medication regimen.Refilled: MS Contin 30 MG one tablet every 8 hours #90. We will continue the opioid monitoring program, this consists of regular clinic visits, examinations, urine drug screen, pill counts as well as use of [place] Controlled Substance Reporting System. 2. Lumbar Radiculitis: Continuecurrent medication regimen withGabapentin.06/262020 3. Bilateral Knee Pain/ Degenerative: Continue Voltaren Gel. Continue to Monitor.09/10/2018 4. Severe diabetic poly neuropathy: Continuecurrent medication regimen withGabapentin.09/10/2018 5. Insomnia:  ContinueTrazodone PsychiatryFollowing.09/10/2018 6.Anxiety/Depression:Lorazepam,ProzacandHydroxyzinePrescribed byPsychiatry.09/10/2018 7. De Quervains Tenosynovitis: No Complaints Today.09/10/2018 8. Right Greater  Trochanteric Bursitis: Continue with Ice and Heat Therapy. Continue to Monitor.09/10/2018. 9. Cervicalgia/ Cervical Radiculitis:Continue with HEP as Tolerated and continue to monitor.09/10/2018 10. Chronic Bilateral Thoracic Back Pain: Continue HEP as Tolerated. Continue current medication regimen. Continue to monitor.09/10/2018.

## 2018-09-14 DIAGNOSIS — K6282 Dysplasia of anus: Secondary | ICD-10-CM | POA: Diagnosis not present

## 2018-09-14 DIAGNOSIS — J449 Chronic obstructive pulmonary disease, unspecified: Secondary | ICD-10-CM | POA: Diagnosis not present

## 2018-09-14 DIAGNOSIS — E119 Type 2 diabetes mellitus without complications: Secondary | ICD-10-CM | POA: Diagnosis not present

## 2018-09-14 DIAGNOSIS — C20 Malignant neoplasm of rectum: Secondary | ICD-10-CM | POA: Diagnosis not present

## 2018-09-14 DIAGNOSIS — I2510 Atherosclerotic heart disease of native coronary artery without angina pectoris: Secondary | ICD-10-CM | POA: Diagnosis not present

## 2018-09-14 DIAGNOSIS — K219 Gastro-esophageal reflux disease without esophagitis: Secondary | ICD-10-CM | POA: Diagnosis not present

## 2018-09-14 DIAGNOSIS — I110 Hypertensive heart disease with heart failure: Secondary | ICD-10-CM | POA: Diagnosis not present

## 2018-10-03 ENCOUNTER — Other Ambulatory Visit: Payer: Self-pay | Admitting: Physical Medicine & Rehabilitation

## 2018-10-04 ENCOUNTER — Other Ambulatory Visit: Payer: Self-pay | Admitting: "Endocrinology

## 2018-10-05 ENCOUNTER — Encounter: Payer: Medicare Other | Attending: Registered Nurse | Admitting: Registered Nurse

## 2018-10-05 ENCOUNTER — Other Ambulatory Visit: Payer: Self-pay

## 2018-10-05 ENCOUNTER — Encounter: Payer: Self-pay | Admitting: Registered Nurse

## 2018-10-05 VITALS — BP 169/82 | HR 81 | Temp 97.7°F | Ht 64.0 in | Wt 230.0 lb

## 2018-10-05 DIAGNOSIS — G894 Chronic pain syndrome: Secondary | ICD-10-CM

## 2018-10-05 DIAGNOSIS — M4726 Other spondylosis with radiculopathy, lumbar region: Secondary | ICD-10-CM | POA: Insufficient documentation

## 2018-10-05 DIAGNOSIS — I110 Hypertensive heart disease with heart failure: Secondary | ICD-10-CM | POA: Diagnosis not present

## 2018-10-05 DIAGNOSIS — J449 Chronic obstructive pulmonary disease, unspecified: Secondary | ICD-10-CM | POA: Insufficient documentation

## 2018-10-05 DIAGNOSIS — F419 Anxiety disorder, unspecified: Secondary | ICD-10-CM | POA: Insufficient documentation

## 2018-10-05 DIAGNOSIS — M25562 Pain in left knee: Secondary | ICD-10-CM | POA: Diagnosis not present

## 2018-10-05 DIAGNOSIS — M545 Low back pain: Secondary | ICD-10-CM | POA: Diagnosis not present

## 2018-10-05 DIAGNOSIS — M542 Cervicalgia: Secondary | ICD-10-CM | POA: Diagnosis not present

## 2018-10-05 DIAGNOSIS — I2510 Atherosclerotic heart disease of native coronary artery without angina pectoris: Secondary | ICD-10-CM | POA: Diagnosis not present

## 2018-10-05 DIAGNOSIS — E1142 Type 2 diabetes mellitus with diabetic polyneuropathy: Secondary | ICD-10-CM | POA: Insufficient documentation

## 2018-10-05 DIAGNOSIS — G4733 Obstructive sleep apnea (adult) (pediatric): Secondary | ICD-10-CM | POA: Insufficient documentation

## 2018-10-05 DIAGNOSIS — E785 Hyperlipidemia, unspecified: Secondary | ICD-10-CM | POA: Diagnosis not present

## 2018-10-05 DIAGNOSIS — I25118 Atherosclerotic heart disease of native coronary artery with other forms of angina pectoris: Secondary | ICD-10-CM | POA: Diagnosis not present

## 2018-10-05 DIAGNOSIS — I509 Heart failure, unspecified: Secondary | ICD-10-CM | POA: Diagnosis not present

## 2018-10-05 DIAGNOSIS — Z79891 Long term (current) use of opiate analgesic: Secondary | ICD-10-CM

## 2018-10-05 DIAGNOSIS — M1712 Unilateral primary osteoarthritis, left knee: Secondary | ICD-10-CM

## 2018-10-05 DIAGNOSIS — M546 Pain in thoracic spine: Secondary | ICD-10-CM | POA: Diagnosis not present

## 2018-10-05 DIAGNOSIS — F329 Major depressive disorder, single episode, unspecified: Secondary | ICD-10-CM | POA: Insufficient documentation

## 2018-10-05 DIAGNOSIS — G8929 Other chronic pain: Secondary | ICD-10-CM | POA: Diagnosis not present

## 2018-10-05 DIAGNOSIS — M7062 Trochanteric bursitis, left hip: Secondary | ICD-10-CM | POA: Diagnosis not present

## 2018-10-05 DIAGNOSIS — M5416 Radiculopathy, lumbar region: Secondary | ICD-10-CM

## 2018-10-05 DIAGNOSIS — K219 Gastro-esophageal reflux disease without esophagitis: Secondary | ICD-10-CM | POA: Insufficient documentation

## 2018-10-05 DIAGNOSIS — Z823 Family history of stroke: Secondary | ICD-10-CM | POA: Insufficient documentation

## 2018-10-05 DIAGNOSIS — G4700 Insomnia, unspecified: Secondary | ICD-10-CM | POA: Diagnosis not present

## 2018-10-05 DIAGNOSIS — Z955 Presence of coronary angioplasty implant and graft: Secondary | ICD-10-CM | POA: Diagnosis not present

## 2018-10-05 DIAGNOSIS — I252 Old myocardial infarction: Secondary | ICD-10-CM | POA: Diagnosis not present

## 2018-10-05 DIAGNOSIS — F411 Generalized anxiety disorder: Secondary | ICD-10-CM | POA: Diagnosis not present

## 2018-10-05 DIAGNOSIS — M7061 Trochanteric bursitis, right hip: Secondary | ICD-10-CM

## 2018-10-05 DIAGNOSIS — M1711 Unilateral primary osteoarthritis, right knee: Secondary | ICD-10-CM

## 2018-10-05 DIAGNOSIS — Z8249 Family history of ischemic heart disease and other diseases of the circulatory system: Secondary | ICD-10-CM | POA: Insufficient documentation

## 2018-10-05 DIAGNOSIS — M48062 Spinal stenosis, lumbar region with neurogenic claudication: Secondary | ICD-10-CM

## 2018-10-05 DIAGNOSIS — M25561 Pain in right knee: Secondary | ICD-10-CM | POA: Diagnosis not present

## 2018-10-05 DIAGNOSIS — Z5181 Encounter for therapeutic drug level monitoring: Secondary | ICD-10-CM | POA: Diagnosis not present

## 2018-10-05 MED ORDER — MORPHINE SULFATE ER 30 MG PO TBCR: 30.0000 mg | EXTENDED_RELEASE_TABLET | Freq: Three times a day (TID) | ORAL | 0 refills | Status: DC

## 2018-10-05 NOTE — Progress Notes (Signed)
Subjective:    Patient ID: [name], female    DOB: 1950/09/27, 68 y.o.   MRN: [id number]  HPI: [name] is a 68 y.o. female who returns for follow up appointment for chronic pain and medication refill. She states her pain is located in her neck, mid- lower back radiating into her bilateral lower extremities and bilateral feet. Also reports right knee pain. She rates her  Pain 7. Her current exercise regime is walking.   [name] reports on her way to her appointment she received a call, she was diagnosed with Cancer. Dr. Ladona Horns note was reviewed, pathology report reveals differentiated squamous cell cancer. She will be referred to Oncology. [name] very tearful. Emotional support given.   [name] Morphine equivalent is 90.00  MME.  Last UDS was Performed on 05/17/2018, it was consistent.   Pain Inventory Average Pain 7 Pain Right Now 7 My pain is sharp, burning, dull, stabbing, tingling and aching  In the last 24 hours, has pain interfered with the following? General activity 6 Relation with others 7 Enjoyment of life 7 What TIME of day is your pain at its worst? evening Sleep (in general) Poor  Pain is worse with: walking, bending, sitting, inactivity, standing and some activites Pain improves with: heat/ice and medication Relief from Meds: 2  Mobility walk with assistance use a walker ability to climb steps?  no do you drive?  no  Function not employed: date last employed . disabled: date disabled . I need assistance with the following:  dressing, bathing, meal prep, household duties and shopping  Neuro/Psych bladder control problems bowel control problems weakness numbness tremor tingling trouble walking spasms dizziness confusion depression anxiety loss of taste or smell  Prior Studies Any changes since last visit?  no  Physicians involved in your care Any changes since last visit?  no   Family History  Problem Relation Age of  Onset  . Heart attack Mother   . Colon cancer Mother   . Heart attack Father   . Stroke Sister   . Hemophilia Child   . Cancer Other   . Coronary artery disease Other   . Cancer Brother   . Cancer Maternal Aunt   . Cancer Maternal Uncle   . Cancer Paternal Aunt   . Cancer Paternal Uncle   . Cancer Brother    Social History   Socioeconomic History  . Marital status: Legally Separated    Spouse name: Not on file  . Number of children: 4  . Years of education: Not on file  . Highest education level: Not on file  Occupational History  . Occupation: Disabled  Social Needs  . Financial resource strain: Not on file  . Food insecurity    Worry: Not on file    Inability: Not on file  . Transportation needs    Medical: Not on file    Non-medical: Not on file  Tobacco Use  . Smoking status: Never Smoker  . Smokeless tobacco: Never Used  Substance and Sexual Activity  . Alcohol use: No    Alcohol/week: 0.0 standard drinks  . Drug use: No  . Sexual activity: Not on file  Lifestyle  . Physical activity    Days per week: Not on file    Minutes per session: Not on file  . Stress: Not on file  Relationships  . Social Herbalist on phone: Not on file    Gets together: Not  on file    Attends religious service: Not on file    Active member of club or organization: Not on file    Attends meetings of clubs or organizations: Not on file    Relationship status: Not on file  Other Topics Concern  . Not on file  Social History Narrative   Patient does not get regular exercise   Denies caffeine use    Past Surgical History:  Procedure Laterality Date  . ABDOMINAL HYSTERECTOMY    . Arm Surgery Bilateral    due to fall-pt broke both forearms  . BIOPSY N/A 07/01/2013   Procedure: BIOPSY;  Surgeon: Rogene Houston, MD;  [facility];  Service: Endoscopy;  Laterality: N/A;  . CHOLECYSTECTOMY    . COLONOSCOPY  08/06/2011   Procedure: COLONOSCOPY;  Surgeon: Rogene Houston, MD;  [facility];  Service: Endoscopy;  Laterality: N/A;  215  . COLONOSCOPY WITH PROPOFOL N/A 12/31/2012   Procedure: COLONOSCOPY WITH PROPOFOL;  Surgeon: Rogene Houston, MD;  [facility];  Service: Endoscopy;  Laterality: N/A;  in cecum at 0814, total withdrawal time 27min  . COLONOSCOPY WITH PROPOFOL N/A 02/05/2018   Procedure: COLONOSCOPY WITH PROPOFOL;  Surgeon: Rogene Houston, MD;  [facility];  Service: Endoscopy;  Laterality: N/A;  1:25  . CORONARY ANGIOPLASTY WITH STENT PLACEMENT    . ESOPHAGEAL DILATION N/A 06/07/2015   Procedure: ESOPHAGEAL DILATION;  Surgeon: Rogene Houston, MD;  [facility];  Service: Endoscopy;  Laterality: N/A;  . ESOPHAGOGASTRODUODENOSCOPY N/A 06/07/2015   Procedure: ESOPHAGOGASTRODUODENOSCOPY (EGD);  Surgeon: Rogene Houston, MD;  [facility];  Service: Endoscopy;  Laterality: N/A;  2:45 - moved to 1:00 - Ann to notify pt  . ESOPHAGOGASTRODUODENOSCOPY (EGD) WITH PROPOFOL N/A 12/31/2012   Procedure: ESOPHAGOGASTRODUODENOSCOPY (EGD) WITH PROPOFOL;  Surgeon: Rogene Houston, MD;  [facility];  Service: Endoscopy;  Laterality: N/A;  GE junction 37,  . ESOPHAGOGASTRODUODENOSCOPY (EGD) WITH PROPOFOL N/A 07/01/2013   Procedure: ESOPHAGOGASTRODUODENOSCOPY (EGD) WITH PROPOFOL;  Surgeon: Rogene Houston, MD;  [facility];  Service: Endoscopy;  Laterality: N/A;  950  . MALONEY DILATION N/A 12/31/2012   Procedure: MALONEY DILATION;  Surgeon: Rogene Houston, MD;  [facility];  Service: Endoscopy;  Laterality: N/A;  used a # 54,#56  . MALONEY DILATION N/A 07/01/2013   Procedure: Venia Minks DILATION;  Surgeon: Rogene Houston, MD;  [facility];  Service: Endoscopy;  Laterality: N/A;  54/58; no heme present  . POLYPECTOMY  02/05/2018   Procedure: POLYPECTOMY;  Surgeon: Rogene Houston, MD;  [facility];  Service: Endoscopy;;  distal rectum (HS)   Past Medical History:  Diagnosis Date   . Anxiety and depression   . Arthritis   . CAD (coronary artery disease)    Stent placement circumflex coronary 2007, catheterization 2008 patent stents. Normal LV function  . Chest pain   . CHF (congestive heart failure) ([place])   . COPD (chronic obstructive pulmonary disease) ([facility])    no home O2  . Depression   . Diabetes mellitus    Insulin dependent  . Diabetic polyneuropathy (H[facility])     severe on multiple medications  . Dyslipidemia   . GERD (gastroesophageal reflux disease)   . Headache(784.0)   . 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 Hypertension   . MI (myocardial infarction) ([place])    2007  . Obstructive sleep apnea    CPAP, setting ?  2  . Palpitations   . Tachycardia    There were no vitals taken for this visit.  Opioid Risk Score:   Fall Risk Score:  `1  Depression screen PHQ 2/9  Depression screen [facility] 2/9 07/16/2018 06/12/2017 04/14/2017 02/17/2017 01/14/2017 12/24/2016 11/11/2016  Decreased Interest 1 3 3  0 3 0 0  Down, Depressed, Hopeless 1 3 3  0 3 0 0  PHQ - 2 Score 2 6 6  0 6 0 0  Altered sleeping - 3 - - - - -  Tired, decreased energy - 3 - - - - -  Change in appetite - 0 - - - - -  Feeling bad or failure about yourself  - 3 - - - - -  Trouble concentrating - 3 - - - - -  Moving slowly or fidgety/restless - 1 - - - - -  Suicidal thoughts - 0 - - - - -  PHQ-9 Score - 19 - - - - -  Difficult doing work/chores - Very difficult - - - - -  Some recent data might be hidden     Review of Systems  Constitutional: Negative.   HENT: Negative.   Eyes: Negative.   Respiratory: Positive for apnea, choking, shortness of breath and wheezing.   Cardiovascular: Positive for leg swelling.  Gastrointestinal: Positive for abdominal pain, constipation, diarrhea, nausea and vomiting.  Endocrine: Negative.   Genitourinary: Positive for difficulty urinating.  Musculoskeletal: Positive for arthralgias, back pain, gait problem and myalgias.  Skin: Negative.   Allergic/Immunologic: Negative.    Neurological: Positive for dizziness, weakness and numbness.  Hematological: Negative.   Psychiatric/Behavioral: Positive for confusion and dysphoric mood. The patient is nervous/anxious.   All other systems reviewed and are negative.      Objective:   Physical Exam Vitals signs and nursing note reviewed.  Constitutional:      Appearance: Normal appearance. She is obese.  Neck:     Comments: Cervical Paraspinal Tenderness: C-5-C-6 Cardiovascular:     Rate and Rhythm: Normal rate and regular rhythm.     Pulses: Normal pulses.     Heart sounds: Normal heart sounds.  Pulmonary:     Effort: Pulmonary effort is normal.     Breath sounds: Normal breath sounds.  Musculoskeletal:     Comments: Normal Muscle Bulk and Muscle Testing Reveals:  Upper Extremities: Full ROM and Muscle Strength 5/5 Thoracic Paraspinal Tenderness: T-7-T-9 Lumbar Hypersensitivity Right Greater Trochanter Tenderness Lower Extremities: Decreased ROM ad Muscle Strength 5/5 Arises from Table Slowly  Narrow Based  Gait   Skin:    General: Skin is warm and dry.  Neurological:     General: No focal deficit present.     Mental Status: She is alert and oriented to person, place, and time.  Psychiatric:        Mood and Affect: Mood normal.        Behavior: Behavior normal.           Assessment & Plan:  1.Lumbar pain lumbar spondylosis: Encouraged to continue toincrease Activity as tolerated. 10/05/2018. Continue current medication regimen.Refilled: MS Contin 30 MG one tablet every 8 hours #90. We will continue the opioid monitoring program, this consists of regular clinic visits, examinations, urine drug screen, pill counts as well as use of [place] Controlled Substance Reporting System. 2. Lumbar Radiculitis: Continuecurrent medication regimen withGabapentin.07/212020 3. Bilateral Knee Pain/ Degenerative: Continue Voltaren Gel. Continue to Monitor.10/05/2018 4. Severe diabetic poly  neuropathy: Continuecurrent medication regimen withGabapentin.10/05/2018 5.  Insomnia: ContinueTrazodone PsychiatryFollowing.10/05/2018 6.Anxiety/Depression:Lorazepam,ProzacandHydroxyzinePrescribed byPsychiatry.10/05/2018 7. De Quervains Tenosynovitis: No Complaints Today.10/05/2018 8.RightGreater Trochanteric Bursitis: Continue with Ice and Heat Therapy. Continue to Monitor.10/05/2018. 9. Cervicalgia/ Cervical Radiculitis:Continue with HEP as Tolerated and continue to monitor.10/05/2018 10. Chronic Bilateral Thoracic Back Pain: Continue HEP as Tolerated. Continue current medication regimen. Continue to monitor.10/05/2018.  30 minutes of face to face patient care time was spent during this visit. All questions were encouraged and answered.  F/U in 1 month

## 2018-10-06 ENCOUNTER — Other Ambulatory Visit: Payer: Self-pay | Admitting: "Endocrinology

## 2018-10-06 DIAGNOSIS — E1159 Type 2 diabetes mellitus with other circulatory complications: Secondary | ICD-10-CM

## 2018-10-18 DIAGNOSIS — Z8371 Family history of colonic polyps: Secondary | ICD-10-CM | POA: Diagnosis not present

## 2018-10-18 DIAGNOSIS — Z800 Family history of malignant neoplasm of digestive organs: Secondary | ICD-10-CM | POA: Diagnosis not present

## 2018-10-18 DIAGNOSIS — C20 Malignant neoplasm of rectum: Secondary | ICD-10-CM | POA: Diagnosis not present

## 2018-10-18 DIAGNOSIS — D490 Neoplasm of unspecified behavior of digestive system: Secondary | ICD-10-CM | POA: Diagnosis not present

## 2018-10-18 DIAGNOSIS — Z114 Encounter for screening for human immunodeficiency virus [HIV]: Secondary | ICD-10-CM | POA: Diagnosis not present

## 2018-10-27 DIAGNOSIS — E1142 Type 2 diabetes mellitus with diabetic polyneuropathy: Secondary | ICD-10-CM | POA: Diagnosis not present

## 2018-10-27 DIAGNOSIS — E1165 Type 2 diabetes mellitus with hyperglycemia: Secondary | ICD-10-CM | POA: Diagnosis not present

## 2018-10-27 DIAGNOSIS — R109 Unspecified abdominal pain: Secondary | ICD-10-CM | POA: Diagnosis not present

## 2018-10-27 DIAGNOSIS — I10 Essential (primary) hypertension: Secondary | ICD-10-CM | POA: Diagnosis not present

## 2018-10-27 DIAGNOSIS — Z6837 Body mass index (BMI) 37.0-37.9, adult: Secondary | ICD-10-CM | POA: Diagnosis not present

## 2018-10-27 DIAGNOSIS — Z299 Encounter for prophylactic measures, unspecified: Secondary | ICD-10-CM | POA: Diagnosis not present

## 2018-10-27 DIAGNOSIS — F332 Major depressive disorder, recurrent severe without psychotic features: Secondary | ICD-10-CM | POA: Diagnosis not present

## 2018-11-02 DIAGNOSIS — E1159 Type 2 diabetes mellitus with other circulatory complications: Secondary | ICD-10-CM | POA: Diagnosis not present

## 2018-11-03 LAB — HEMOGLOBIN A1C
Hgb A1c MFr Bld: 10.1 % of total Hgb — ABNORMAL HIGH (ref <5.7)
Mean Plasma Glucose: 243 (calc)
eAG (mmol/L): 13.5 (calc)

## 2018-11-03 LAB — COMPLETE METABOLIC PANEL WITH GFR
AG Ratio: 1.3 (calc) (ref 1.0–2.5)
ALT: 13 U/L (ref 6–29)
AST: 13 U/L (ref 10–35)
Albumin: 3.4 g/dL — ABNORMAL LOW (ref 3.6–5.1)
Alkaline phosphatase (APISO): 78 U/L (ref 37–153)
BUN: 13 mg/dL (ref 7–25)
CO2: 25 mmol/L (ref 20–32)
Calcium: 9.1 mg/dL (ref 8.6–10.4)
Chloride: 105 mmol/L (ref 98–110)
Creat: 0.7 mg/dL (ref 0.50–0.99)
GFR, Est African American: 105 mL/min/{1.73_m2} (ref >=60)
GFR, Est Non African American: 91 mL/min/{1.73_m2} (ref >=60)
Globulin: 2.7 g/dL (calc) (ref 1.9–3.7)
Glucose, Bld: 150 mg/dL — ABNORMAL HIGH (ref 65–99)
Potassium: 4.5 mmol/L (ref 3.5–5.3)
Sodium: 140 mmol/L (ref 135–146)
Total Bilirubin: 0.7 mg/dL (ref 0.2–1.2)
Total Protein: 6.1 g/dL (ref 6.1–8.1)

## 2018-11-09 ENCOUNTER — Ambulatory Visit (INDEPENDENT_AMBULATORY_CARE_PROVIDER_SITE_OTHER): Payer: Medicare Other | Admitting: "Endocrinology

## 2018-11-09 ENCOUNTER — Other Ambulatory Visit: Payer: Self-pay | Admitting: Registered Nurse

## 2018-11-09 ENCOUNTER — Other Ambulatory Visit: Payer: Self-pay

## 2018-11-09 ENCOUNTER — Encounter: Payer: Self-pay | Admitting: "Endocrinology

## 2018-11-09 DIAGNOSIS — E1159 Type 2 diabetes mellitus with other circulatory complications: Secondary | ICD-10-CM

## 2018-11-09 DIAGNOSIS — E782 Mixed hyperlipidemia: Secondary | ICD-10-CM

## 2018-11-09 DIAGNOSIS — I10 Essential (primary) hypertension: Secondary | ICD-10-CM | POA: Diagnosis not present

## 2018-11-09 DIAGNOSIS — E559 Vitamin D deficiency, unspecified: Secondary | ICD-10-CM

## 2018-11-09 DIAGNOSIS — I25118 Atherosclerotic heart disease of native coronary artery with other forms of angina pectoris: Secondary | ICD-10-CM

## 2018-11-09 MED ORDER — TRESIBA FLEXTOUCH 100 UNIT/ML ~~LOC~~ SOPN: PEN_INJECTOR | SUBCUTANEOUS | 2 refills | Status: DC

## 2018-11-09 MED ORDER — GLIPIZIDE ER 5 MG PO TB24: 5.0000 mg | ORAL_TABLET | Freq: Every day | ORAL | 3 refills | Status: DC

## 2018-11-09 NOTE — Telephone Encounter (Signed)
Refill request for Morphine ER 30 mg. Last filled 10/09/2018 #90, next appt 11/15/2018

## 2018-11-09 NOTE — Telephone Encounter (Signed)
PMP was reviewed: MS Contin was e-scribed today. Placed a call to Ms. Bocchino regarding the above, no answer. Left message asking for her to return the call.

## 2018-11-09 NOTE — Progress Notes (Signed)
11/09/2018                                                    Endocrinology Telehealth Visit Follow up Note -During COVID -19 Pandemic  This visit type was conducted due to national recommendations for restrictions regarding the COVID-19 Pandemic  in an effort to limit this patient's exposure and mitigate transmission of the corona virus.  Due to her co-morbid illnesses, [name] is at  moderate to high risk for complications without adequate follow up.  This format is felt to be most appropriate for her at this time.  I connected with this patient on 11/09/2018   by telephone and verified that I am speaking with the correct person using two identifiers. [name], 09-26-1950. she has verbally consented to this visit. All issues noted in this document were discussed and addressed. The format was not optimal for physical exam.     Subjective:    Patient ID: [name], female    DOB: 12/24/1950. Patient is being engaged in telehealth for follow-up for management of currently uncontrolled, complicated type 2 diabetes; hyperlipidemia; hypertension.  Past Medical History:  Diagnosis Date  . Anxiety and depression   . Arthritis   . CAD (coronary artery disease)    Stent placement circumflex coronary 2007, catheterization 2008 patent stents. Normal LV function  . Chest pain   . CHF (congestive heart failure) ([place])   . COPD (chronic obstructive pulmonary disease) ([facility])    no home O2  . Depression   . Diabetes mellitus    Insulin dependent  . Diabetic polyneuropathy ([facility])     severe on multiple medications  . Dyslipidemia   . GERD (gastroesophageal reflux disease)   . Headache(784.0)   . 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 Hypertension   . MI (myocardial infarction) ([place])    2007  . Obstructive sleep apnea    CPAP, setting ?2  . Palpitations   . Tachycardia    Past Surgical History:  Procedure Laterality Date  . ABDOMINAL HYSTERECTOMY    . Arm Surgery Bilateral    due to  fall-pt broke both forearms  . BIOPSY N/A 07/01/2013   Procedure: BIOPSY;  Surgeon: Rogene Houston, MD;  Location: [facility];  Service: Endoscopy;  Laterality: N/A;  . CHOLECYSTECTOMY    . COLONOSCOPY  08/06/2011   Procedure: COLONOSCOPY;  Surgeon: Rogene Houston, MD;  Location: [facility];  Service: Endoscopy;  Laterality: N/A;  215  . COLONOSCOPY WITH PROPOFOL N/A 12/31/2012   Procedure: COLONOSCOPY WITH PROPOFOL;  Surgeon: Rogene Houston, MD;  Location: [facility];  Service: Endoscopy;  Laterality: N/A;  in cecum at 0814, total withdrawal time 23mn  . COLONOSCOPY WITH PROPOFOL N/A 02/05/2018   Procedure: COLONOSCOPY WITH PROPOFOL;  Surgeon: RRogene Houston MD;  [facility]: [facility];  Service: Endoscopy;  Laterality: N/A;  1:25  . CORONARY ANGIOPLASTY WITH STENT PLACEMENT    . ESOPHAGEAL DILATION N/A 06/07/2015   Procedure: ESOPHAGEAL DILATION;  Surgeon: NRogene Houston MD;  [facility]: [facility];  Service: Endoscopy;  Laterality: N/A;  . ESOPHAGOGASTRODUODENOSCOPY N/A 06/07/2015   Procedure: ESOPHAGOGASTRODUODENOSCOPY (EGD);  Surgeon: NRogene Houston MD;  [facility]: [facility];  Service: Endoscopy;  Laterality: N/A;  2:45 - moved to 1:00 - Ann to notify  pt  . ESOPHAGOGASTRODUODENOSCOPY (EGD) WITH PROPOFOL N/A 12/31/2012   Procedure: ESOPHAGOGASTRODUODENOSCOPY (EGD) WITH PROPOFOL;  Surgeon: Rogene Houston, MD;  Location: [facility];  Service: Endoscopy;  Laterality: N/A;  GE junction 37,  . ESOPHAGOGASTRODUODENOSCOPY (EGD) WITH PROPOFOL N/A 07/01/2013   Procedure: ESOPHAGOGASTRODUODENOSCOPY (EGD) WITH PROPOFOL;  Surgeon: Rogene Houston, MD;  Location: [facility];  Service: Endoscopy;  Laterality: N/A;  950  . MALONEY DILATION N/A 12/31/2012   Procedure: MALONEY DILATION;  Surgeon: Rogene Houston, MD;  Location: [facility];  Service: Endoscopy;  Laterality: N/A;  used a # 54,#56  . MALONEY DILATION N/A 07/01/2013   Procedure: Venia Minks DILATION;  Surgeon: Rogene Houston, MD;  Location: [facility];   Service: Endoscopy;  Laterality: N/A;  54/58; no heme present  . POLYPECTOMY  02/05/2018   Procedure: POLYPECTOMY;  Surgeon: Rogene Houston, MD;  [facility];  Service: Endoscopy;;  distal rectum (HS)   Social History   Socioeconomic History  . Marital status: Legally Separated    Spouse name: Not on file  . Number of children: 4  . Years of education: Not on file  . Highest education level: Not on file  Occupational History  . Occupation: Disabled  Social Needs  . Financial resource strain: Not on file  . Food insecurity    Worry: Not on file    Inability: Not on file  . Transportation needs    Medical: Not on file    Non-medical: Not on file  Tobacco Use  . Smoking status: Never Smoker  . Smokeless tobacco: Never Used  Substance and Sexual Activity  . Alcohol use: No    Alcohol/week: 0.0 standard drinks  . Drug use: No  . Sexual activity: Not on file  Lifestyle  . Physical activity    Days per week: Not on file    Minutes per session: Not on file  . Stress: Not on file  Relationships  . Social Herbalist on phone: Not on file    Gets together: Not on file    Attends religious service: Not on file    Active member of club or organization: Not on file    Attends meetings of clubs or organizations: Not on file    Relationship status: Not on file  Other Topics Concern  . Not on file  Social History Narrative   Patient does not get regular exercise   Denies caffeine use    Outpatient Encounter Medications as of 11/09/2018  Medication Sig  . aspirin EC 81 MG tablet Take 1 tablet (81 mg total) by mouth daily.  Marland Kitchen atorvastatin (LIPITOR) 40 MG tablet Take 40 mg by mouth daily.  Marland Kitchen buPROPion (WELLBUTRIN XL) 150 MG 24 hr tablet Take 150-300 mg by mouth See admin instructions. Take 300 mg by mouth in the morning and take 150 mg by mouth at noon  . Cholecalciferol (VITAMIN D3) 125 MCG (5000 UT) CAPS TAKE ONE CAPSULE BY MOUTH DAILY  . diclofenac sodium  (VOLTAREN) 1 % GEL APPLY TO BOTH KNEES THREE TIMES DAILY  . diltiazem (TIAZAC) 180 MG 24 hr capsule Take 180 mg by mouth daily.  Marland Kitchen estrogens, conjugated, (PREMARIN) 1.25 MG tablet Take 1.25 mg by mouth daily.   Marland Kitchen FLUoxetine (PROZAC) 20 MG capsule Take 20 mg by mouth 2 (two) times daily.   . furosemide (LASIX) 20 MG tablet Take 20 mg by mouth daily as needed for fluid.   Marland Kitchen gabapentin (NEURONTIN) 400  MG capsule Take 400 mg by mouth 3 (three) times daily.  Marland Kitchen glipiZIDE (GLUCOTROL XL) 5 MG 24 hr tablet Take 1 tablet (5 mg total) by mouth daily with breakfast.  . hydrOXYzine (ATARAX/VISTARIL) 10 MG tablet Take 10 mg by mouth 4 (four) times daily as needed for anxiety.  . lidocaine (XYLOCAINE) 5 % ointment Apply 1 application topically at bedtime.   . lidocaine-hydrocortisone (ANAMANTEL HC) 3-0.5 % CREA Place 1 Applicatorful rectally 2 (two) times daily for 7 days.  Marland Kitchen lisinopril (PRINIVIL,ZESTRIL) 5 MG tablet Take 1 tablet (5 mg total) by mouth daily.  Marland Kitchen LORazepam (ATIVAN) 0.5 MG tablet Take 0.5 mg by mouth 2 (two) times daily as needed for anxiety.   . meloxicam (MOBIC) 7.5 MG tablet Take 1 tablet (7.5 mg total) by mouth daily.  . metFORMIN (GLUCOPHAGE) 500 MG tablet TAKE 1 TABLET BY MOUTH TWICE DAILY  . methocarbamol (ROBAXIN) 500 MG tablet Take 1 tablet (500 mg total) by mouth 3 (three) times daily. (Patient taking differently: Take 500 mg by mouth 3 (three) times daily as needed for muscle spasms. )  . metoCLOPramide (REGLAN) 5 MG tablet Take 5 mg by mouth 3 (three) times daily as needed for nausea or vomiting.   . metoprolol succinate (TOPROL-XL) 50 MG 24 hr tablet TAKE 1 AND 1/2 TABLETS BY MOUTH EVERY DAY  . montelukast (SINGULAIR) 10 MG tablet Take 10 mg by mouth daily.  . nitroGLYCERIN (NITROSTAT) 0.4 MG SL tablet Place 1 tablet (0.4 mg total) under the tongue every 5 (five) minutes x 3 doses as needed. For chest pains  . ondansetron (ZOFRAN) 4 MG tablet Take 4 mg by mouth every 6 (six) hours as  needed for nausea or vomiting.  . pantoprazole (PROTONIX) 40 MG tablet TAKE 1 TABLET BY MOUTH TWICE DAILY BEFORE MEALS  . potassium chloride (K-DUR,KLOR-CON) 10 MEQ tablet Take 10 mEq by mouth 2 (two) times daily.  Marland Kitchen PROAIR HFA 108 (90 Base) MCG/ACT inhaler Inhale 2 puffs into the lungs every 4 (four) hours as needed for wheezing or shortness of breath.   . ranitidine (ZANTAC) 300 MG tablet Take 1 tablet (300 mg total) by mouth at bedtime as needed for heartburn. (Patient taking differently: Take 300 mg by mouth 2 (two) times daily. )  . sertraline (ZOLOFT) 100 MG tablet Take 1 tablet by mouth daily.  . sucralfate (CARAFATE) 1 g tablet Take 1 tablet (1 g total) by mouth 4 (four) times daily -  with meals and at bedtime.  . topiramate (TOPAMAX) 200 MG tablet Take 200 mg by mouth at bedtime.  . TRESIBA FLEXTOUCH 100 UNIT/ML SOPN FlexTouch Pen INJECT 60 UNITS INTO THE SKIN AT BEDTIME  . zolpidem (AMBIEN) 10 MG tablet Take 10 mg by mouth at bedtime.  . [DISCONTINUED] morphine (MS CONTIN) 30 MG 12 hr tablet Take 1 tablet (30 mg total) by mouth every 8 (eight) hours.  . [DISCONTINUED] TRESIBA FLEXTOUCH 100 UNIT/ML SOPN FlexTouch Pen INJECT 50 UNITS INTO THE SKIN AT BEDTIME   No facility-administered encounter medications on file as of 11/09/2018.    ALLERGIES: Allergies  Allergen Reactions  . Nitrofuran Derivatives Itching and Swelling  . Amphetamine-Dextroamphet Er Swelling  . Pregabalin Swelling  . Topiramate Other (See Comments)    Tongue tingle  . Verelan [Verapamil] Rash   VACCINATION STATUS:  There is no immunization history on file for this patient.  Diabetes She presents for her follow-up diabetic visit. She has type 2 diabetes mellitus. Onset time: She  was diagnosed at approximate age of 56 years. Her disease course has been worsening. There are no hypoglycemic associated symptoms. Pertinent negatives for hypoglycemia include no confusion, headaches, pallor or seizures. Pertinent  negatives for diabetes include no chest pain, no fatigue, no polydipsia, no polyphagia and no polyuria. There are no hypoglycemic complications. Symptoms are worsening. Diabetic complications include autonomic neuropathy, heart disease, peripheral neuropathy, PVD and retinopathy. Risk factors for coronary artery disease include dyslipidemia, diabetes mellitus, obesity, hypertension and sedentary lifestyle. Current diabetic treatments: Lantus 65 units daily at bedtime, Humalog 15 units 3 times a day before meals. She mentions allergy to Victoza, Byetta, and metformin. She is following a generally unhealthy diet. When asked about meal planning, she reported none. She has not had a previous visit with a dietitian. She never participates in exercise. Her home blood glucose trend is increasing steadily. Her breakfast blood glucose range is generally 140-180 mg/dl. Her bedtime blood glucose range is generally 180-200 mg/dl. Her overall blood glucose range is 180-200 mg/dl. An ACE inhibitor/angiotensin II receptor blocker is not being taken. Eye exam is current.  Hyperlipidemia This is a chronic problem. The current episode started more than 1 year ago. The problem is uncontrolled. Recent lipid tests were reviewed and are high. Exacerbating diseases include diabetes and obesity. Pertinent negatives include no chest pain, myalgias or shortness of breath. Current antihyperlipidemic treatment includes statins. Risk factors for coronary artery disease include diabetes mellitus, dyslipidemia, hypertension, obesity and a sedentary lifestyle.  Hypertension This is a chronic problem. The current episode started more than 1 year ago. Pertinent negatives include no chest pain, headaches, palpitations or shortness of breath. Risk factors for coronary artery disease include diabetes mellitus, dyslipidemia, obesity and sedentary lifestyle. Hypertensive end-organ damage includes CAD/MI, PVD and retinopathy.    Review of systems:  Limited as above.  Objective:    There were no vitals taken for this visit.  Wt Readings from Last 3 Encounters:  10/05/18 230 lb (104.3 kg)  09/10/18 231 lb (104.8 kg)  08/11/18 219 lb (99.3 kg)     CMP     Component Value Date/Time   NA 140 11/02/2018 1105   NA 143 11/06/2016 1112   K 4.5 11/02/2018 1105   CL 105 11/02/2018 1105   CO2 25 11/02/2018 1105   GLUCOSE 150 (H) 11/02/2018 1105   BUN 13 11/02/2018 1105   BUN 11 11/06/2016 1112   CREATININE 0.70 11/02/2018 1105   CALCIUM 9.1 11/02/2018 1105   PROT 6.1 11/02/2018 1105   PROT 5.5 (L) 11/06/2016 1112   ALBUMIN 2.9 (L) 12/07/2017 1413   ALBUMIN 3.4 (L) 11/06/2016 1112   AST 13 11/02/2018 1105   ALT 13 11/02/2018 1105   ALKPHOS 65 12/07/2017 1413   BILITOT 0.7 11/02/2018 1105   BILITOT 0.3 11/06/2016 1112   GFRNONAA 91 11/02/2018 1105   GFRAA 105 11/02/2018 1105   Diabetic Labs (most recent): Lab Results  Component Value Date   HGBA1C 10.1 (H) 11/02/2018   HGBA1C 8.1 (H) 07/06/2018   HGBA1C 7.1 (H) 01/27/2018    Lipid Panel     Component Value Date/Time   CHOL 173 07/06/2018 0936   CHOL 184 04/15/2016 1121   TRIG 109 07/06/2018 0936   HDL 62 07/06/2018 0936   HDL 77 04/15/2016 1121   CHOLHDL 2.8 07/06/2018 0936   VLDL UNABLE TO CALCULATE IF TRIGLYCERIDE OVER 400 mg/dL 02/18/2017 0528   LDLCALC 90 07/06/2018 0936     Assessment & Plan:  1. Type 2 diabetes mellitus with vascular disease ([place]) - Patient has currently uncontrolled symptomatic type 2 DM since  68 years of age. - She reports above significantly above target glycemic profile at fasting.  Her previsit labs show A1c of 10.1% increasing from 8.1%.    Recent labs reviewed.  - Her diabetes is complicated by obesity/sedentary life, coronary artery disease status post stent placement and patient remains at a high risk for more acute and chronic complications of diabetes which include CAD, CVA, CKD, retinopathy, and neuropathy. These are all  discussed in detail with the patient.  - I have counseled the patient on diet management and weight loss, by adopting a carbohydrate restricted/protein rich diet.  - she  admits there is a room for improvement in her diet and drink choices. -  Suggestion is made for her to avoid simple carbohydrates  from her diet including Cakes, Sweet Desserts / Pastries, Ice Cream, Soda (diet and regular), Sweet Tea, Candies, Chips, Cookies, Sweet Pastries,  Store Bought Juices, Alcohol in Excess of  1-2 drinks a day, Artificial Sweeteners, Coffee Creamer, and "Sugar-free" Products. This will help patient to have stable blood glucose profile and potentially avoid unintended weight gain.   - Patient is advised to stick to a routine mealtimes to eat 3 meals  a day and avoid unnecessary snacks ( to snack only to correct hypoglycemia).   - I have approached patient with the following individualized plan to manage diabetes and patient agrees:   - She has normal renal function. -She will continue to require basal insulin at least in order for her to achieve and maintain control of diabetes to target. -She is advised to increase Tresiba to 60 units nightly,  associated with monitoring of blood glucose at least twice daily before breakfast and anytime as needed.  - Patient is warned not to take insulin without proper monitoring per orders. -Adjustment parameters are given for hypo and hyperglycemia in writing. -Patient is encouraged to call clinic for blood glucose levels less than 70 or above 200 mg /dl. -She has intolerance to GLP-1 receptor agonists. -She is advised to continue metformin 500 mg p.o. twice daily after breakfast and after supper.     2) BP/HTN: she is advised to home monitor blood pressure and report if > 140/90 on 2 separate readings.  She has adequate medications, advised to continue including metoprolol XL 50 mg p.o. daily.Marland Kitchen   3) Lipids/HPL: She has uncontrolled hypertriglyceridemia , still  high at 439  LDL better at 50, HDL at 77. She is advised to continue   Lipitor 40 mg by mouth daily at bedtime. Better control of diabetes will help for triglycerides.   4) Chronic Care/Health Maintenance:  -Patient is on Statin medications and encouraged to continue to follow up with Ophthalmology, Podiatrist at least yearly or according to recommendations, and advised to   stay away from smoking. I have recommended yearly flu vaccine and pneumonia vaccination at least every 5 years;  and  sleep for at least 7 hours a day. -She will benefit from a set of diabetic shoes, paperwork filled for her.  - I advised patient to maintain close follow up with Glenda Chroman, MD for primary care needs.  - Patient Care Time Today:  25 min, of which >50% was spent in  counseling and the rest reviewing her  current and  previous labs/studies, previous treatments, her blood glucose readings, and medications' doses and developing a plan for long-term care  based on the latest recommendations for standards of care.   [name] participated in the discussions, expressed understanding, and voiced agreement with the above plans.  All questions were answered to her satisfaction. she is encouraged to contact clinic should she have any questions or concerns prior to her return visit.  Follow up plan: - Patient to go to emergency room for better evaluation of chest pain. Return in about 2 weeks (around 11/23/2018), or phone, for Follow up with [facility] - no Labs.  Glade Lloyd, MD Phone: [phone]  Fax: [phone]  This note was partially dictated with voice recognition software. Similar sounding words can be transcribed inadequately or may not  be corrected upon review.  11/09/2018, 4:17 PM

## 2018-11-15 ENCOUNTER — Encounter: Payer: Medicare Other | Attending: Registered Nurse | Admitting: Registered Nurse

## 2018-11-15 ENCOUNTER — Other Ambulatory Visit: Payer: Self-pay

## 2018-11-15 ENCOUNTER — Encounter: Payer: Self-pay | Admitting: Registered Nurse

## 2018-11-15 VITALS — BP 163/79 | HR 67 | Temp 98.8°F | Ht 64.0 in | Wt 234.0 lb

## 2018-11-15 DIAGNOSIS — M4726 Other spondylosis with radiculopathy, lumbar region: Secondary | ICD-10-CM | POA: Diagnosis not present

## 2018-11-15 DIAGNOSIS — G4700 Insomnia, unspecified: Secondary | ICD-10-CM | POA: Insufficient documentation

## 2018-11-15 DIAGNOSIS — F411 Generalized anxiety disorder: Secondary | ICD-10-CM | POA: Diagnosis not present

## 2018-11-15 DIAGNOSIS — M546 Pain in thoracic spine: Secondary | ICD-10-CM | POA: Diagnosis not present

## 2018-11-15 DIAGNOSIS — G8929 Other chronic pain: Secondary | ICD-10-CM | POA: Diagnosis not present

## 2018-11-15 DIAGNOSIS — G894 Chronic pain syndrome: Secondary | ICD-10-CM

## 2018-11-15 DIAGNOSIS — M25512 Pain in left shoulder: Secondary | ICD-10-CM

## 2018-11-15 DIAGNOSIS — I25118 Atherosclerotic heart disease of native coronary artery with other forms of angina pectoris: Secondary | ICD-10-CM

## 2018-11-15 DIAGNOSIS — F329 Major depressive disorder, single episode, unspecified: Secondary | ICD-10-CM | POA: Insufficient documentation

## 2018-11-15 DIAGNOSIS — F419 Anxiety disorder, unspecified: Secondary | ICD-10-CM | POA: Insufficient documentation

## 2018-11-15 DIAGNOSIS — Z8249 Family history of ischemic heart disease and other diseases of the circulatory system: Secondary | ICD-10-CM | POA: Diagnosis not present

## 2018-11-15 DIAGNOSIS — Z955 Presence of coronary angioplasty implant and graft: Secondary | ICD-10-CM | POA: Insufficient documentation

## 2018-11-15 DIAGNOSIS — E1142 Type 2 diabetes mellitus with diabetic polyneuropathy: Secondary | ICD-10-CM | POA: Insufficient documentation

## 2018-11-15 DIAGNOSIS — Z823 Family history of stroke: Secondary | ICD-10-CM | POA: Insufficient documentation

## 2018-11-15 DIAGNOSIS — G4733 Obstructive sleep apnea (adult) (pediatric): Secondary | ICD-10-CM | POA: Insufficient documentation

## 2018-11-15 DIAGNOSIS — J449 Chronic obstructive pulmonary disease, unspecified: Secondary | ICD-10-CM | POA: Insufficient documentation

## 2018-11-15 DIAGNOSIS — I509 Heart failure, unspecified: Secondary | ICD-10-CM | POA: Diagnosis not present

## 2018-11-15 DIAGNOSIS — M7062 Trochanteric bursitis, left hip: Secondary | ICD-10-CM | POA: Insufficient documentation

## 2018-11-15 DIAGNOSIS — M542 Cervicalgia: Secondary | ICD-10-CM | POA: Insufficient documentation

## 2018-11-15 DIAGNOSIS — K219 Gastro-esophageal reflux disease without esophagitis: Secondary | ICD-10-CM | POA: Insufficient documentation

## 2018-11-15 DIAGNOSIS — E785 Hyperlipidemia, unspecified: Secondary | ICD-10-CM | POA: Insufficient documentation

## 2018-11-15 DIAGNOSIS — I252 Old myocardial infarction: Secondary | ICD-10-CM | POA: Insufficient documentation

## 2018-11-15 DIAGNOSIS — M5416 Radiculopathy, lumbar region: Secondary | ICD-10-CM | POA: Diagnosis not present

## 2018-11-15 DIAGNOSIS — M7061 Trochanteric bursitis, right hip: Secondary | ICD-10-CM | POA: Insufficient documentation

## 2018-11-15 DIAGNOSIS — Z5181 Encounter for therapeutic drug level monitoring: Secondary | ICD-10-CM

## 2018-11-15 DIAGNOSIS — I2510 Atherosclerotic heart disease of native coronary artery without angina pectoris: Secondary | ICD-10-CM | POA: Insufficient documentation

## 2018-11-15 DIAGNOSIS — M25562 Pain in left knee: Secondary | ICD-10-CM | POA: Insufficient documentation

## 2018-11-15 DIAGNOSIS — M25561 Pain in right knee: Secondary | ICD-10-CM | POA: Diagnosis not present

## 2018-11-15 DIAGNOSIS — M1711 Unilateral primary osteoarthritis, right knee: Secondary | ICD-10-CM | POA: Diagnosis not present

## 2018-11-15 DIAGNOSIS — I110 Hypertensive heart disease with heart failure: Secondary | ICD-10-CM | POA: Insufficient documentation

## 2018-11-15 DIAGNOSIS — M48062 Spinal stenosis, lumbar region with neurogenic claudication: Secondary | ICD-10-CM | POA: Diagnosis not present

## 2018-11-15 DIAGNOSIS — M545 Low back pain: Secondary | ICD-10-CM | POA: Diagnosis not present

## 2018-11-15 DIAGNOSIS — Z79891 Long term (current) use of opiate analgesic: Secondary | ICD-10-CM | POA: Diagnosis not present

## 2018-11-15 DIAGNOSIS — M1712 Unilateral primary osteoarthritis, left knee: Secondary | ICD-10-CM | POA: Diagnosis not present

## 2018-11-15 NOTE — Progress Notes (Signed)
Subjective:    Patient ID: [name], female    DOB: [birth year], 68 y.o.   MRN: [id number]  HPI: [name] is a 68 y.o. female who returns for follow up appointment for chronic pain and medication refill.  She states her pain is located in her neck radiating into her left shoulder, mid-lower back pain radiating into her bilateral lower extremities, bilateral hip pain and bilateral knee pain. Also reports bilateral feet pain with burning sensation. She rates her pain 5. Her current exercise regime is walking.  Ms. Quintero Morphine equivalent is 90.00  MME. She is also prescribed Lorazepam  by Zettie Pho We have discussed the black box warning of using opioids and benzodiazepines. I highlighted the dangers of using these drugs together and discussed the adverse events including respiratory suppression, overdose, cognitive impairment and importance of compliance with current regimen. We will continue to monitor and adjust as indicated.  she is being closely monitored and under the care of her psychiatrist at Dr. Casimiro Needle.  Last UDS was Performed on 05/17/2018, it was consistent..   Pain Inventory Average Pain 7 Pain Right Now 5 My pain is sharp, burning, dull, stabbing, tingling and aching  In the last 24 hours, has pain interfered with the following? General activity 5 Relation with others 5 Enjoyment of life 5 What TIME of day is your pain at its worst? morning and night  Sleep (in general) Poor  Pain is worse with: walking, bending, sitting, standing and some activites Pain improves with: rest, heat/ice and medication Relief from Meds: 5  Mobility walk without assistance walk with assistance use a walker how many minutes can you walk? 5 ability to climb steps?  no do you drive?  no use a wheelchair Do you have any goals in this area?  no  Function not employed: date last employed . disabled: date disabled . I need assistance with the following:  dressing, bathing,  meal prep, household duties and shopping Do you have any goals in this area?  no  Neuro/Psych numbness tremor tingling trouble walking spasms dizziness confusion depression anxiety  Prior Studies Any changes since last visit?  no  Physicians involved in your care Any changes since last visit?  no   Family History  Problem Relation Age of Onset  . Heart attack Mother   . Colon cancer Mother   . Heart attack Father   . Stroke Sister   . Hemophilia Child   . Cancer Other   . Coronary artery disease Other   . Cancer Brother   . Cancer Maternal Aunt   . Cancer Maternal Uncle   . Cancer Paternal Aunt   . Cancer Paternal Uncle   . Cancer Brother    Social History   Socioeconomic History  . Marital status: Legally Separated    Spouse name: Not on file  . Number of children: 4  . Years of education: Not on file  . Highest education level: Not on file  Occupational History  . Occupation: Disabled  Social Needs  . Financial resource strain: Not on file  . Food insecurity    Worry: Not on file    Inability: Not on file  . Transportation needs    Medical: Not on file    Non-medical: Not on file  Tobacco Use  . Smoking status: Never Smoker  . Smokeless tobacco: Never Used  Substance and Sexual Activity  . Alcohol use: No    Alcohol/week: 0.0 standard drinks  .  Drug use: No  . Sexual activity: Not on file  Lifestyle  . Physical activity    Days per week: Not on file    Minutes per session: Not on file  . Stress: Not on file  Relationships  . Social Herbalist on phone: Not on file    Gets together: Not on file    Attends religious service: Not on file    Active member of club or organization: Not on file    Attends meetings of clubs or organizations: Not on file    Relationship status: Not on file  Other Topics Concern  . Not on file  Social History Narrative   Patient does not get regular exercise   Denies caffeine use    Past Surgical  History:  Procedure Laterality Date  . ABDOMINAL HYSTERECTOMY    . Arm Surgery Bilateral    due to fall-pt broke both forearms  . BIOPSY N/A 07/01/2013   Procedure: BIOPSY;  Surgeon: Rogene Houston, MD;  Location: [facility];  Service: Endoscopy;  Laterality: N/A;  . CHOLECYSTECTOMY    . COLONOSCOPY  08/06/2011   Procedure: COLONOSCOPY;  Surgeon: Rogene Houston, MD;  [facility]: [facility];  Service: Endoscopy;  Laterality: N/A;  215  . COLONOSCOPY WITH PROPOFOL N/A 12/31/2012   Procedure: COLONOSCOPY WITH PROPOFOL;  Surgeon: Rogene Houston, MD;  Location: [facility];  Service: Endoscopy;  Laterality: N/A;  in cecum at 0814, total withdrawal time 83min  . COLONOSCOPY WITH PROPOFOL N/A 02/05/2018   Procedure: COLONOSCOPY WITH PROPOFOL;  Surgeon: Rogene Houston, MD;  [facility]: [facility];  Service: Endoscopy;  Laterality: N/A;  1:25  . CORONARY ANGIOPLASTY WITH STENT PLACEMENT    . ESOPHAGEAL DILATION N/A 06/07/2015   Procedure: ESOPHAGEAL DILATION;  Surgeon: Rogene Houston, MD;  [facility];  Service: Endoscopy;  Laterality: N/A;  . ESOPHAGOGASTRODUODENOSCOPY N/A 06/07/2015   Procedure: ESOPHAGOGASTRODUODENOSCOPY (EGD);  Surgeon: Rogene Houston, MD;  [facility];  Service: Endoscopy;  Laterality: N/A;  2:45 - moved to 1:00 - Ann to notify pt  . ESOPHAGOGASTRODUODENOSCOPY (EGD) WITH PROPOFOL N/A 12/31/2012   Procedure: ESOPHAGOGASTRODUODENOSCOPY (EGD) WITH PROPOFOL;  Surgeon: Rogene Houston, MD;  Location: [facility];  Service: Endoscopy;  Laterality: N/A;  GE junction 37,  . ESOPHAGOGASTRODUODENOSCOPY (EGD) WITH PROPOFOL N/A 07/01/2013   Procedure: ESOPHAGOGASTRODUODENOSCOPY (EGD) WITH PROPOFOL;  Surgeon: Rogene Houston, MD;  Location: [facility];  Service: Endoscopy;  Laterality: N/A;  950  . MALONEY DILATION N/A 12/31/2012   Procedure: MALONEY DILATION;  Surgeon: Rogene Houston, MD;  Location: [facility];  Service: Endoscopy;  Laterality: N/A;  used a # 54,#56  . MALONEY  DILATION N/A 07/01/2013   Procedure: Venia Minks DILATION;  Surgeon: Rogene Houston, MD;  Location: [facility];  Service: Endoscopy;  Laterality: N/A;  54/58; no heme present  . POLYPECTOMY  02/05/2018   Procedure: POLYPECTOMY;  Surgeon: Rogene Houston, MD;  [facility];  Service: Endoscopy;;  distal rectum (HS)   Past Medical History:  Diagnosis Date  . Anxiety and depression   . Arthritis   . CAD (coronary artery disease)    Stent placement circumflex coronary 2007, catheterization 2008 patent stents. Normal LV function  . Chest pain   . CHF (congestive heart failure) ([place])   . COPD (chronic obstructive pulmonary disease) ([facility])    no home O2  . Depression   . Diabetes mellitus  Insulin dependent  . Diabetic polyneuropathy ([facility])     severe on multiple medications  . Dyslipidemia   . GERD (gastroesophageal reflux disease)   . Headache(784.0)   . 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 Hypertension   . MI (myocardial infarction) ([place])    2007  . Obstructive sleep apnea    CPAP, setting ?2  . Palpitations   . Tachycardia    BP (!) 163/79 (BP Location: Right Arm)   Pulse 67   Temp 98.8 F (37.1 C) (Oral)   Ht 5\' 4"  (1.626 m)   Wt 234 lb (106.1 kg)   SpO2 95%   BMI 40.17 kg/m   Opioid Risk Score:   Fall Risk Score:  `1  Depression screen PHQ 2/9  Depression screen [facility] 2/9 07/16/2018 06/12/2017 04/14/2017 02/17/2017 01/14/2017 12/24/2016 11/11/2016  Decreased Interest 1 3 3  0 3 0 0  Down, Depressed, Hopeless 1 3 3  0 3 0 0  PHQ - 2 Score 2 6 6  0 6 0 0  Altered sleeping - 3 - - - - -  Tired, decreased energy - 3 - - - - -  Change in appetite - 0 - - - - -  Feeling bad or failure about yourself  - 3 - - - - -  Trouble concentrating - 3 - - - - -  Moving slowly or fidgety/restless - 1 - - - - -  Suicidal thoughts - 0 - - - - -  PHQ-9 Score - 19 - - - - -  Difficult doing work/chores - Very difficult - - - - -  Some recent data might be hidden    Review of Systems   Constitutional: Positive for appetite change.  HENT: Negative.   Eyes: Negative.   Respiratory: Positive for apnea and shortness of breath.   Gastrointestinal: Positive for abdominal pain, nausea and vomiting.  Endocrine: Negative.   Genitourinary: Negative.   Musculoskeletal: Positive for gait problem and joint swelling.  Skin: Negative.   Allergic/Immunologic: Negative.   Neurological: Positive for dizziness and weakness.  Hematological: Negative.   Psychiatric/Behavioral: Positive for confusion and dysphoric mood. The patient is nervous/anxious.   All other systems reviewed and are negative.      Objective:   Physical Exam Vitals signs and nursing note reviewed.  Constitutional:      Appearance: Normal appearance.  Neck:     Musculoskeletal: Normal range of motion and neck supple.     Comments: Cervical Paraspinal Tenderness: C-5-C-6 Cardiovascular:     Rate and Rhythm: Normal rate and regular rhythm.     Pulses: Normal pulses.     Heart sounds: Normal heart sounds.  Pulmonary:     Effort: Pulmonary effort is normal.     Breath sounds: Normal breath sounds.  Musculoskeletal:     Comments: Normal Muscle Bulk and Muscle Testing Reveals:  Upper Extremities: Full ROM and Muscle Strength 5/5 Bilateral AC Joint Tenderness  Thoracic Paraspinal Tenderness: T-7-T-9  Lumbar Hypersensitivity Bilateral Greater Trochanter Tenderness Lower Extremities: Decreased ROM and Muscle Strength 5/5 Bilateral Lower Extremities Flexion Produces Pain into Bilateral Lower Extremities Arises from Table Slowly Antalgic Gait   Skin:    General: Skin is warm and dry.  Neurological:     Mental Status: She is alert and oriented to person, place, and time.  Psychiatric:        Mood and Affect: Mood normal.        Behavior: Behavior normal.  Assessment & Plan:  1.Lumbar pain lumbar spondylosis: Encouraged to continue toincrease Activity as tolerated. 11/15/2018. Continue  current medication regimen.Refilled: MS Contin 30 MG one tablet every 8 hours #90. We will continue the opioid monitoring program, this consists of regular clinic visits, examinations, urine drug screen, pill counts as well as use of [place] Controlled Substance Reporting System. 2. Lumbar Radiculitis: Continuecurrent medication regimen withGabapentin.[id number] 3. Bilateral Knee Pain/ Degenerative: Continue Voltaren Gel. Continue to Monitor.11/15/2018 4. Severe diabetic poly neuropathy: Continuecurrent medication regimen withGabapentin.11/15/2018 5. Insomnia: ContinueTrazodone PsychiatryFollowing.11/15/2018 6.Anxiety/Depression:Lorazepam,ProzacandHydroxyzinePrescribed byPsychiatry.11/15/2018 7. De Quervains Tenosynovitis: No Complaints Today.11/15/2018 8.Bilateral Greater Trochanteric Bursitis: Continue with Ice and Heat Therapy. Continue to Monitor.11/15/2018. 9. Cervicalgia/ Cervical Radiculitis:Continue with HEP as Tolerated and continue to monitor.11/15/2018 10. Chronic Bilateral Thoracic Back Pain: Continue HEP as Tolerated. Continue current medication regimen. Continue to monitor.11/15/2018.  20 minutes of face to face patient care time was spent during this visit. All questions were encouraged and answered.  F/U in 1 month

## 2018-11-17 DIAGNOSIS — E1165 Type 2 diabetes mellitus with hyperglycemia: Secondary | ICD-10-CM | POA: Diagnosis not present

## 2018-11-17 DIAGNOSIS — Z1211 Encounter for screening for malignant neoplasm of colon: Secondary | ICD-10-CM | POA: Diagnosis not present

## 2018-11-17 DIAGNOSIS — Z6841 Body Mass Index (BMI) 40.0 and over, adult: Secondary | ICD-10-CM | POA: Diagnosis not present

## 2018-11-17 DIAGNOSIS — Z1331 Encounter for screening for depression: Secondary | ICD-10-CM | POA: Diagnosis not present

## 2018-11-17 DIAGNOSIS — Z299 Encounter for prophylactic measures, unspecified: Secondary | ICD-10-CM | POA: Diagnosis not present

## 2018-11-17 DIAGNOSIS — I10 Essential (primary) hypertension: Secondary | ICD-10-CM | POA: Diagnosis not present

## 2018-11-17 DIAGNOSIS — Z7189 Other specified counseling: Secondary | ICD-10-CM | POA: Diagnosis not present

## 2018-11-17 DIAGNOSIS — Z0000 Encounter for general adult medical examination without abnormal findings: Secondary | ICD-10-CM | POA: Diagnosis not present

## 2018-11-17 DIAGNOSIS — R5383 Other fatigue: Secondary | ICD-10-CM | POA: Diagnosis not present

## 2018-11-17 DIAGNOSIS — Z1339 Encounter for screening examination for other mental health and behavioral disorders: Secondary | ICD-10-CM | POA: Diagnosis not present

## 2018-11-17 DIAGNOSIS — E7800 Pure hypercholesterolemia, unspecified: Secondary | ICD-10-CM | POA: Diagnosis not present

## 2018-11-17 DIAGNOSIS — F332 Major depressive disorder, recurrent severe without psychotic features: Secondary | ICD-10-CM | POA: Diagnosis not present

## 2018-11-19 DIAGNOSIS — Z8371 Family history of colonic polyps: Secondary | ICD-10-CM | POA: Diagnosis not present

## 2018-11-19 DIAGNOSIS — D490 Neoplasm of unspecified behavior of digestive system: Secondary | ICD-10-CM | POA: Diagnosis not present

## 2018-11-19 DIAGNOSIS — R87619 Unspecified abnormal cytological findings in specimens from cervix uteri: Secondary | ICD-10-CM | POA: Diagnosis not present

## 2018-11-25 ENCOUNTER — Encounter: Payer: Self-pay | Admitting: "Endocrinology

## 2018-11-25 ENCOUNTER — Other Ambulatory Visit: Payer: Self-pay

## 2018-11-25 ENCOUNTER — Ambulatory Visit (INDEPENDENT_AMBULATORY_CARE_PROVIDER_SITE_OTHER): Payer: Medicare Other | Admitting: "Endocrinology

## 2018-11-25 DIAGNOSIS — I25118 Atherosclerotic heart disease of native coronary artery with other forms of angina pectoris: Secondary | ICD-10-CM

## 2018-11-25 DIAGNOSIS — E1159 Type 2 diabetes mellitus with other circulatory complications: Secondary | ICD-10-CM | POA: Diagnosis not present

## 2018-11-25 DIAGNOSIS — I10 Essential (primary) hypertension: Secondary | ICD-10-CM | POA: Diagnosis not present

## 2018-11-25 DIAGNOSIS — E782 Mixed hyperlipidemia: Secondary | ICD-10-CM | POA: Diagnosis not present

## 2018-11-25 DIAGNOSIS — E559 Vitamin D deficiency, unspecified: Secondary | ICD-10-CM

## 2018-11-25 NOTE — Progress Notes (Signed)
11/25/2018                                                    Endocrinology Telehealth Visit Follow up Note -During COVID -19 Pandemic  This visit type was conducted due to national recommendations for restrictions regarding the COVID-19 Pandemic  in an effort to limit this patient's exposure and mitigate transmission of the corona virus.  Due to her co-morbid illnesses, [name] is at  moderate to high risk for complications without adequate follow up.  This format is felt to be most appropriate for her at this time.  I connected with this patient on 11/25/2018   by telephone and verified that I am speaking with the correct person using two identifiers. [name], 05-18-1950. she has verbally consented to this visit. All issues noted in this document were discussed and addressed. The format was not optimal for physical exam.     Subjective:    Patient ID: [name], female    DOB: 11/15/1950. Patient is being engaged in telehealth for follow-up for management of currently uncontrolled, complicated type 2 diabetes; hyperlipidemia; hypertension.  Past Medical History:  Diagnosis Date  . Anxiety and depression   . Arthritis   . CAD (coronary artery disease)    Stent placement circumflex coronary 2007, catheterization 2008 patent stents. Normal LV function  . Chest pain   . CHF (congestive heart failure) ([place])   . COPD (chronic obstructive pulmonary disease) ([facility])    no home O2  . Depression   . Diabetes mellitus    Insulin dependent  . Diabetic polyneuropathy ([facility])     severe on multiple medications  . Dyslipidemia   . GERD (gastroesophageal reflux disease)   . Headache(784.0)   . 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 Hypertension   . MI (myocardial infarction) ([place])    2007  . Obstructive sleep apnea    CPAP, setting ?2  . Palpitations   . Tachycardia    Past Surgical History:  Procedure Laterality Date  . ABDOMINAL HYSTERECTOMY    . Arm Surgery Bilateral    due to  fall-pt broke both forearms  . BIOPSY N/A 07/01/2013   Procedure: BIOPSY;  Surgeon: Rogene Houston, MD;  Location: [facility];  Service: Endoscopy;  Laterality: N/A;  . CHOLECYSTECTOMY    . COLONOSCOPY  08/06/2011   Procedure: COLONOSCOPY;  Surgeon: Rogene Houston, MD;  [facility];  Service: Endoscopy;  Laterality: N/A;  215  . COLONOSCOPY WITH PROPOFOL N/A 12/31/2012   Procedure: COLONOSCOPY WITH PROPOFOL;  Surgeon: Rogene Houston, MD;  Location: [facility];  Service: Endoscopy;  Laterality: N/A;  in cecum at 0814, total withdrawal time 14min  . COLONOSCOPY WITH PROPOFOL N/A 02/05/2018   Procedure: COLONOSCOPY WITH PROPOFOL;  Surgeon: Rogene Houston, MD;  [facility];  Service: Endoscopy;  Laterality: N/A;  1:25  . CORONARY ANGIOPLASTY WITH STENT PLACEMENT    . ESOPHAGEAL DILATION N/A 06/07/2015   Procedure: ESOPHAGEAL DILATION;  Surgeon: Rogene Houston, MD;  [facility];  Service: Endoscopy;  Laterality: N/A;  . ESOPHAGOGASTRODUODENOSCOPY N/A 06/07/2015   Procedure: ESOPHAGOGASTRODUODENOSCOPY (EGD);  Surgeon: Rogene Houston, MD;  [facility];  Service: Endoscopy;  Laterality: N/A;  2:45 - moved to 1:00 - Ann to notify  pt  . ESOPHAGOGASTRODUODENOSCOPY (EGD) WITH PROPOFOL N/A 12/31/2012   Procedure: ESOPHAGOGASTRODUODENOSCOPY (EGD) WITH PROPOFOL;  Surgeon: Rogene Houston, MD;  Location: [facility];  Service: Endoscopy;  Laterality: N/A;  GE junction 37,  . ESOPHAGOGASTRODUODENOSCOPY (EGD) WITH PROPOFOL N/A 07/01/2013   Procedure: ESOPHAGOGASTRODUODENOSCOPY (EGD) WITH PROPOFOL;  Surgeon: Rogene Houston, MD;  Location: [facility];  Service: Endoscopy;  Laterality: N/A;  950  . MALONEY DILATION N/A 12/31/2012   Procedure: MALONEY DILATION;  Surgeon: Rogene Houston, MD;  Location: [facility];  Service: Endoscopy;  Laterality: N/A;  used a # 54,#56  . MALONEY DILATION N/A 07/01/2013   Procedure: Venia Minks DILATION;  Surgeon: Rogene Houston, MD;  Location: [facility];   Service: Endoscopy;  Laterality: N/A;  54/58; no heme present  . POLYPECTOMY  02/05/2018   Procedure: POLYPECTOMY;  Surgeon: Rogene Houston, MD;  [facility];  Service: Endoscopy;;  distal rectum (HS)   Social History   Socioeconomic History  . Marital status: Legally Separated    Spouse name: Not on file  . Number of children: 4  . Years of education: Not on file  . Highest education level: Not on file  Occupational History  . Occupation: Disabled  Social Needs  . Financial resource strain: Not on file  . Food insecurity    Worry: Not on file    Inability: Not on file  . Transportation needs    Medical: Not on file    Non-medical: Not on file  Tobacco Use  . Smoking status: Never Smoker  . Smokeless tobacco: Never Used  Substance and Sexual Activity  . Alcohol use: No    Alcohol/week: 0.0 standard drinks  . Drug use: No  . Sexual activity: Not on file  Lifestyle  . Physical activity    Days per week: Not on file    Minutes per session: Not on file  . Stress: Not on file  Relationships  . Social Herbalist on phone: Not on file    Gets together: Not on file    Attends religious service: Not on file    Active member of club or organization: Not on file    Attends meetings of clubs or organizations: Not on file    Relationship status: Not on file  Other Topics Concern  . Not on file  Social History Narrative   Patient does not get regular exercise   Denies caffeine use    Outpatient Encounter Medications as of 11/25/2018  Medication Sig  . aspirin EC 81 MG tablet Take 1 tablet (81 mg total) by mouth daily.  Marland Kitchen atorvastatin (LIPITOR) 40 MG tablet Take 40 mg by mouth daily.  Marland Kitchen buPROPion (WELLBUTRIN XL) 150 MG 24 hr tablet Take 150-300 mg by mouth See admin instructions. Take 300 mg by mouth in the morning and take 150 mg by mouth at noon  . Cholecalciferol (VITAMIN D3) 125 MCG (5000 UT) CAPS TAKE ONE CAPSULE BY MOUTH DAILY  . diclofenac sodium  (VOLTAREN) 1 % GEL APPLY TO BOTH KNEES THREE TIMES DAILY  . estrogens, conjugated, (PREMARIN) 1.25 MG tablet Take 1.25 mg by mouth daily.   Marland Kitchen FLUoxetine (PROZAC) 20 MG capsule Take 20 mg by mouth 2 (two) times daily.   . furosemide (LASIX) 20 MG tablet Take 20 mg by mouth daily as needed for fluid.   Marland Kitchen gabapentin (NEURONTIN) 400 MG capsule Take 400 mg by mouth 3 (three) times daily.  Marland Kitchen glipiZIDE (GLUCOTROL  XL) 5 MG 24 hr tablet Take 1 tablet (5 mg total) by mouth daily with breakfast.  . hydrOXYzine (ATARAX/VISTARIL) 10 MG tablet Take 10 mg by mouth 4 (four) times daily as needed for anxiety.  . lidocaine (XYLOCAINE) 5 % ointment Apply 1 application topically at bedtime.   . lidocaine-hydrocortisone (ANAMANTEL HC) 3-0.5 % CREA Place 1 Applicatorful rectally 2 (two) times daily for 7 days.  Marland Kitchen lisinopril (PRINIVIL,ZESTRIL) 5 MG tablet Take 1 tablet (5 mg total) by mouth daily.  Marland Kitchen LORazepam (ATIVAN) 0.5 MG tablet Take 0.5 mg by mouth 2 (two) times daily as needed for anxiety.   . meloxicam (MOBIC) 7.5 MG tablet Take 1 tablet (7.5 mg total) by mouth daily.  . metFORMIN (GLUCOPHAGE) 500 MG tablet TAKE 1 TABLET BY MOUTH TWICE DAILY  . methocarbamol (ROBAXIN) 500 MG tablet Take 1 tablet (500 mg total) by mouth 3 (three) times daily. (Patient taking differently: Take 500 mg by mouth 3 (three) times daily as needed for muscle spasms. )  . metoCLOPramide (REGLAN) 5 MG tablet Take 5 mg by mouth 3 (three) times daily as needed for nausea or vomiting.   . metoprolol succinate (TOPROL-XL) 50 MG 24 hr tablet TAKE 1 AND 1/2 TABLETS BY MOUTH EVERY DAY  . montelukast (SINGULAIR) 10 MG tablet Take 10 mg by mouth daily.  Marland Kitchen morphine (MS CONTIN) 30 MG 12 hr tablet TAKE 1 TABLET BY MOUTH EVERY 8 HOURS  . nitroGLYCERIN (NITROSTAT) 0.4 MG SL tablet Place 1 tablet (0.4 mg total) under the tongue every 5 (five) minutes x 3 doses as needed. For chest pains  . ondansetron (ZOFRAN) 4 MG tablet Take 4 mg by mouth every 6 (six)  hours as needed for nausea or vomiting.  . pantoprazole (PROTONIX) 40 MG tablet TAKE 1 TABLET BY MOUTH TWICE DAILY BEFORE MEALS  . potassium chloride (K-DUR,KLOR-CON) 10 MEQ tablet Take 10 mEq by mouth 2 (two) times daily.  Marland Kitchen PROAIR HFA 108 (90 Base) MCG/ACT inhaler Inhale 2 puffs into the lungs every 4 (four) hours as needed for wheezing or shortness of breath.   . ranitidine (ZANTAC) 300 MG tablet Take 1 tablet (300 mg total) by mouth at bedtime as needed for heartburn. (Patient taking differently: Take 300 mg by mouth 2 (two) times daily. )  . sucralfate (CARAFATE) 1 g tablet Take 1 tablet (1 g total) by mouth 4 (four) times daily -  with meals and at bedtime.  . topiramate (TOPAMAX) 200 MG tablet Take 200 mg by mouth at bedtime.  . TRESIBA FLEXTOUCH 100 UNIT/ML SOPN FlexTouch Pen INJECT 60 UNITS INTO THE SKIN AT BEDTIME  . zolpidem (AMBIEN) 10 MG tablet Take 10 mg by mouth at bedtime.   No facility-administered encounter medications on file as of 11/25/2018.    ALLERGIES: Allergies  Allergen Reactions  . Amphetamine-Dextroamphetamine Swelling  . Nitrofuran Derivatives Itching and Swelling  . Amphetamine-Dextroamphet Er Swelling  . Pregabalin Swelling  . Topiramate Other (See Comments)    Tongue tingle  . Verelan [Verapamil] Rash   VACCINATION STATUS:  There is no immunization history on file for this patient.  Diabetes She presents for her follow-up diabetic visit. She has type 2 diabetes mellitus. Onset time: She was diagnosed at approximate age of 65 years. Her disease course has been improving. There are no hypoglycemic associated symptoms. Pertinent negatives for hypoglycemia include no confusion, headaches, pallor or seizures. Pertinent negatives for diabetes include no chest pain, no fatigue, no polydipsia, no polyphagia and no  polyuria. There are no hypoglycemic complications. Symptoms are improving. Diabetic complications include autonomic neuropathy, heart disease, peripheral  neuropathy, PVD and retinopathy. Risk factors for coronary artery disease include dyslipidemia, diabetes mellitus, obesity, hypertension and sedentary lifestyle. Current diabetic treatments: Lantus 65 units daily at bedtime, Humalog 15 units 3 times a day before meals. She mentions allergy to Victoza, Byetta, and metformin. She is following a generally unhealthy diet. When asked about meal planning, she reported none. She has not had a previous visit with a dietitian. She never participates in exercise. Her home blood glucose trend is decreasing steadily. Her breakfast blood glucose range is generally 140-180 mg/dl. Her dinner blood glucose range is generally 140-180 mg/dl. Her overall blood glucose range is 140-180 mg/dl. An ACE inhibitor/angiotensin II receptor blocker is not being taken. Eye exam is current.  Hyperlipidemia This is a chronic problem. The current episode started more than 1 year ago. The problem is uncontrolled. Recent lipid tests were reviewed and are high. Exacerbating diseases include diabetes and obesity. Pertinent negatives include no chest pain, myalgias or shortness of breath. Current antihyperlipidemic treatment includes statins. Risk factors for coronary artery disease include diabetes mellitus, dyslipidemia, hypertension, obesity and a sedentary lifestyle.  Hypertension This is a chronic problem. The current episode started more than 1 year ago. Pertinent negatives include no chest pain, headaches, palpitations or shortness of breath. Risk factors for coronary artery disease include diabetes mellitus, dyslipidemia, obesity and sedentary lifestyle. Hypertensive end-organ damage includes CAD/MI, PVD and retinopathy.    Review of systems: Limited as above.  Objective:    There were no vitals taken for this visit.  Wt Readings from Last 3 Encounters:  11/15/18 234 lb (106.1 kg)  10/05/18 230 lb (104.3 kg)  09/10/18 231 lb (104.8 kg)     CMP     Component Value Date/Time    NA 140 11/02/2018 1105   NA 143 11/06/2016 1112   K 4.5 11/02/2018 1105   CL 105 11/02/2018 1105   CO2 25 11/02/2018 1105   GLUCOSE 150 (H) 11/02/2018 1105   BUN 13 11/02/2018 1105   BUN 11 11/06/2016 1112   CREATININE 0.70 11/02/2018 1105   CALCIUM 9.1 11/02/2018 1105   PROT 6.1 11/02/2018 1105   PROT 5.5 (L) 11/06/2016 1112   ALBUMIN 2.9 (L) 12/07/2017 1413   ALBUMIN 3.4 (L) 11/06/2016 1112   AST 13 11/02/2018 1105   ALT 13 11/02/2018 1105   ALKPHOS 65 12/07/2017 1413   BILITOT 0.7 11/02/2018 1105   BILITOT 0.3 11/06/2016 1112   GFRNONAA 91 11/02/2018 1105   GFRAA 105 11/02/2018 1105   Diabetic Labs (most recent): Lab Results  Component Value Date   HGBA1C 10.1 (H) 11/02/2018   HGBA1C 8.1 (H) 07/06/2018   HGBA1C 7.1 (H) 01/27/2018    Lipid Panel     Component Value Date/Time   CHOL 173 07/06/2018 0936   CHOL 184 04/15/2016 1121   TRIG 109 07/06/2018 0936   HDL 62 07/06/2018 0936   HDL 77 04/15/2016 1121   CHOLHDL 2.8 07/06/2018 0936   VLDL UNABLE TO CALCULATE IF TRIGLYCERIDE OVER 400 mg/dL 02/18/2017 0528   LDLCALC 90 07/06/2018 0936     Assessment & Plan:   1. Type 2 diabetes mellitus with vascular disease ([place]) - Patient has currently uncontrolled symptomatic type 2 DM since  68 years of age. - She reports significantly improved glycemic profile after her insulin dose was adjusted during her last encounter.  Her recent A1c prior to  her last visit was 10.1% increasing from 8.1%.    Recent labs reviewed.  - Her diabetes is complicated by obesity/sedentary life, coronary artery disease status post stent placement and patient remains at a high risk for more acute and chronic complications of diabetes which include CAD, CVA, CKD, retinopathy, and neuropathy. These are all discussed in detail with the patient.  - I have counseled the patient on diet management and weight loss, by adopting a carbohydrate restricted/protein rich diet.  - she  admits there is a room  for improvement in her diet and drink choices. -  Suggestion is made for her to avoid simple carbohydrates  from her diet including Cakes, Sweet Desserts / Pastries, Ice Cream, Soda (diet and regular), Sweet Tea, Candies, Chips, Cookies, Sweet Pastries,  Store Bought Juices, Alcohol in Excess of  1-2 drinks a day, Artificial Sweeteners, Coffee Creamer, and "Sugar-free" Products. This will help patient to have stable blood glucose profile and potentially avoid unintended weight gain.   - Patient is advised to stick to a routine mealtimes to eat 3 meals  a day and avoid unnecessary snacks ( to snack only to correct hypoglycemia).   - I have approached patient with the following individualized plan to manage diabetes and patient agrees:   - She has normal renal function. -She will continue to require at least basal insulin in order for her to achieve and maintain control of diabetes to target. -She is advised to continue Tresiba at 60 units nightly,   associated with monitoring of blood glucose at least twice daily before breakfast and anytime as needed.  - Patient is warned not to take insulin without proper monitoring per orders. -Adjustment parameters are given for hypo and hyperglycemia in writing. -Patient is encouraged to call clinic for blood glucose levels less than 70 or above 200 mg /dl. -She has intolerance to GLP-1 receptor agonists. -She is advised to continue metformin 500 mg p.o. twice daily after breakfast and after supper.     2) BP/HTN: she is advised to home monitor blood pressure and report if > 140/90 on 2 separate readings.  She has adequate medications, advised to continue including metoprolol XL 50 mg p.o. daily.  3) Lipids/HPL: She has uncontrolled hypertriglyceridemia , still high at 439  LDL better at 50, HDL at 77. She is advised to continue Lipitor 40 mg p.o. nightly.  Better control of diabetes will help for triglycerides.   4) Chronic Care/Health  Maintenance:  -Patient is on Statin medications and encouraged to continue to follow up with Ophthalmology, Podiatrist at least yearly or according to recommendations, and advised to   stay away from smoking. I have recommended yearly flu vaccine and pneumonia vaccination at least every 5 years;  and  sleep for at least 7 hours a day. -She will benefit from a set of diabetic shoes, paperwork filled for her.  - I advised patient to maintain close follow up with Glenda Chroman, MD for primary care needs.  - Patient Care Time Today:  25 min, of which >50% was spent in  counseling and the rest reviewing her  current and  previous labs/studies, previous treatments, her blood glucose readings, and medications' doses and developing a plan for long-term care based on the latest recommendations for standards of care.   [name] participated in the discussions, expressed understanding, and voiced agreement with the above plans.  All questions were answered to her satisfaction. she is encouraged to contact clinic should  she have any questions or concerns prior to her return visit.  Follow up plan: - Patient to go to emergency room for better evaluation of chest pain. Return in about 4 months (around 03/27/2019) for Bring Meter and Logs- A1c in Office.  Glade Lloyd, MD Phone: [phone]  Fax: [phone]  This note was partially dictated with voice recognition software. Similar sounding words can be transcribed inadequately or may not  be corrected upon review.  11/25/2018, 4:49 PM

## 2018-12-01 ENCOUNTER — Other Ambulatory Visit: Payer: Self-pay | Admitting: "Endocrinology

## 2018-12-06 ENCOUNTER — Encounter: Payer: Medicare Other | Attending: Registered Nurse | Admitting: Registered Nurse

## 2018-12-06 ENCOUNTER — Other Ambulatory Visit: Payer: Self-pay

## 2018-12-06 ENCOUNTER — Encounter: Payer: Self-pay | Admitting: Registered Nurse

## 2018-12-06 VITALS — BP 152/76 | HR 66 | Temp 97.2°F | Resp 14 | Ht 62.0 in | Wt 234.0 lb

## 2018-12-06 DIAGNOSIS — M545 Low back pain: Secondary | ICD-10-CM | POA: Diagnosis not present

## 2018-12-06 DIAGNOSIS — Z8249 Family history of ischemic heart disease and other diseases of the circulatory system: Secondary | ICD-10-CM | POA: Diagnosis not present

## 2018-12-06 DIAGNOSIS — E785 Hyperlipidemia, unspecified: Secondary | ICD-10-CM | POA: Diagnosis not present

## 2018-12-06 DIAGNOSIS — M1712 Unilateral primary osteoarthritis, left knee: Secondary | ICD-10-CM

## 2018-12-06 DIAGNOSIS — M7062 Trochanteric bursitis, left hip: Secondary | ICD-10-CM | POA: Insufficient documentation

## 2018-12-06 DIAGNOSIS — F411 Generalized anxiety disorder: Secondary | ICD-10-CM

## 2018-12-06 DIAGNOSIS — Z79891 Long term (current) use of opiate analgesic: Secondary | ICD-10-CM

## 2018-12-06 DIAGNOSIS — M5416 Radiculopathy, lumbar region: Secondary | ICD-10-CM

## 2018-12-06 DIAGNOSIS — G4700 Insomnia, unspecified: Secondary | ICD-10-CM | POA: Diagnosis not present

## 2018-12-06 DIAGNOSIS — F329 Major depressive disorder, single episode, unspecified: Secondary | ICD-10-CM | POA: Insufficient documentation

## 2018-12-06 DIAGNOSIS — E1142 Type 2 diabetes mellitus with diabetic polyneuropathy: Secondary | ICD-10-CM | POA: Diagnosis not present

## 2018-12-06 DIAGNOSIS — M546 Pain in thoracic spine: Secondary | ICD-10-CM | POA: Insufficient documentation

## 2018-12-06 DIAGNOSIS — I2510 Atherosclerotic heart disease of native coronary artery without angina pectoris: Secondary | ICD-10-CM | POA: Diagnosis not present

## 2018-12-06 DIAGNOSIS — K219 Gastro-esophageal reflux disease without esophagitis: Secondary | ICD-10-CM | POA: Insufficient documentation

## 2018-12-06 DIAGNOSIS — M48062 Spinal stenosis, lumbar region with neurogenic claudication: Secondary | ICD-10-CM | POA: Diagnosis not present

## 2018-12-06 DIAGNOSIS — I252 Old myocardial infarction: Secondary | ICD-10-CM | POA: Insufficient documentation

## 2018-12-06 DIAGNOSIS — I110 Hypertensive heart disease with heart failure: Secondary | ICD-10-CM | POA: Diagnosis not present

## 2018-12-06 DIAGNOSIS — M25561 Pain in right knee: Secondary | ICD-10-CM | POA: Insufficient documentation

## 2018-12-06 DIAGNOSIS — I509 Heart failure, unspecified: Secondary | ICD-10-CM | POA: Diagnosis not present

## 2018-12-06 DIAGNOSIS — J449 Chronic obstructive pulmonary disease, unspecified: Secondary | ICD-10-CM | POA: Insufficient documentation

## 2018-12-06 DIAGNOSIS — M7061 Trochanteric bursitis, right hip: Secondary | ICD-10-CM | POA: Insufficient documentation

## 2018-12-06 DIAGNOSIS — M1711 Unilateral primary osteoarthritis, right knee: Secondary | ICD-10-CM

## 2018-12-06 DIAGNOSIS — I25118 Atherosclerotic heart disease of native coronary artery with other forms of angina pectoris: Secondary | ICD-10-CM | POA: Diagnosis not present

## 2018-12-06 DIAGNOSIS — M4726 Other spondylosis with radiculopathy, lumbar region: Secondary | ICD-10-CM | POA: Diagnosis not present

## 2018-12-06 DIAGNOSIS — G8929 Other chronic pain: Secondary | ICD-10-CM | POA: Insufficient documentation

## 2018-12-06 DIAGNOSIS — Z5181 Encounter for therapeutic drug level monitoring: Secondary | ICD-10-CM | POA: Diagnosis not present

## 2018-12-06 DIAGNOSIS — M25562 Pain in left knee: Secondary | ICD-10-CM | POA: Diagnosis not present

## 2018-12-06 DIAGNOSIS — M542 Cervicalgia: Secondary | ICD-10-CM | POA: Diagnosis not present

## 2018-12-06 DIAGNOSIS — F419 Anxiety disorder, unspecified: Secondary | ICD-10-CM | POA: Diagnosis not present

## 2018-12-06 DIAGNOSIS — Z823 Family history of stroke: Secondary | ICD-10-CM | POA: Insufficient documentation

## 2018-12-06 DIAGNOSIS — G894 Chronic pain syndrome: Secondary | ICD-10-CM | POA: Diagnosis not present

## 2018-12-06 DIAGNOSIS — Z955 Presence of coronary angioplasty implant and graft: Secondary | ICD-10-CM | POA: Diagnosis not present

## 2018-12-06 DIAGNOSIS — G4733 Obstructive sleep apnea (adult) (pediatric): Secondary | ICD-10-CM | POA: Diagnosis not present

## 2018-12-06 MED ORDER — MORPHINE SULFATE ER 30 MG PO TBCR: 30.0000 mg | EXTENDED_RELEASE_TABLET | Freq: Three times a day (TID) | ORAL | 0 refills | Status: DC

## 2018-12-06 NOTE — Progress Notes (Signed)
Subjective:    Patient ID: [name], female    DOB: [birth year], 68 y.o.   MRN: [id number]  HPI: [name] is a 68 y.o. female who returns for follow up appointment for chronic pain and medication refill. She states her pain is located in her neck, upper-lower back pain radiating into her bilateral lower extremities, bilateral hip pain and bilateral knee pain. She rates her  Pain 5. Her current exercise regime is walking.  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 Notes was reviewed.   [name] Morphine equivalent is 90.00 MME.  She  is also prescribed Lorazepam by [employer]. We have discussed the black box warning of using opioids and benzodiazepines. I highlighted the dangers of using these drugs together and discussed the adverse events including respiratory suppression, overdose, cognitive impairment and importance of compliance with current regimen. We will continue to monitor and adjust as indicated.   She  is being closely monitored and under the care of her psychiatrist Dr. Casimiro Needle.  Last UDS was Performed on 05/17/2018, it was consistent.    Pain Inventory Average Pain 6 Pain Right Now 5 My pain is sharp, burning, dull, stabbing, tingling and aching  In the last 24 hours, has pain interfered with the following? General activity 6 Relation with others 6 Enjoyment of life 4 What TIME of day is your pain at its worst? morning, night  Sleep (in general) Fair  Pain is worse with: bending, sitting, standing and some activites Pain improves with: rest and medication Relief from Meds: 5  Mobility walk with assistance use a cane use a walker how many minutes can you walk? 5-10 ability to climb steps?  no do you drive?  yes use a wheelchair  Function not employed: date last employed . I need assistance with the following:  dressing, bathing, meal prep, household duties and shopping  Neuro/Psych bladder control problems  bowel control problems weakness numbness tremor tingling trouble walking dizziness confusion depression anxiety loss of taste or smell  Prior Studies Any changes since last visit?  no  Physicians involved in your care Any changes since last visit?  no   Family History  Problem Relation Age of Onset  . Heart attack Mother   . Colon cancer Mother   . Heart attack Father   . Stroke Sister   . Hemophilia Child   . Cancer Other   . Coronary artery disease Other   . Cancer Brother   . Cancer Maternal Aunt   . Cancer Maternal Uncle   . Cancer Paternal Aunt   . Cancer Paternal Uncle   . Cancer Brother    Social History   Socioeconomic History  . Marital status: Legally Separated    Spouse name: Not on file  . Number of children: 4  . Years of education: Not on file  . Highest education level: Not on file  Occupational History  . Occupation: Disabled  Social Needs  . Financial resource strain: Not on file  . Food insecurity    Worry: Not on file    Inability: Not on file  . Transportation needs    Medical: Not on file    Non-medical: Not on file  Tobacco Use  . Smoking status: Never Smoker  . Smokeless tobacco: Never Used  Substance and Sexual Activity  . Alcohol use: No    Alcohol/week: 0.0 standard drinks  . Drug use: No  . Sexual activity: Not on  file  Lifestyle  . Physical activity    Days per week: Not on file    Minutes per session: Not on file  . Stress: Not on file  Relationships  . Social Herbalist on phone: Not on file    Gets together: Not on file    Attends religious service: Not on file    Active member of club or organization: Not on file    Attends meetings of clubs or organizations: Not on file    Relationship status: Not on file  Other Topics Concern  . Not on file  Social History Narrative   Patient does not get regular exercise   Denies caffeine use    Past Surgical History:  Procedure Laterality Date  . ABDOMINAL  HYSTERECTOMY    . Arm Surgery Bilateral    due to fall-pt broke both forearms  . BIOPSY N/A 07/01/2013   Procedure: BIOPSY;  Surgeon: Rogene Houston, MD;  [facility]: [facility];  Service: Endoscopy;  Laterality: N/A;  . CHOLECYSTECTOMY    . COLONOSCOPY  08/06/2011   Procedure: COLONOSCOPY;  Surgeon: Rogene Houston, MD;  [facility];  Service: Endoscopy;  Laterality: N/A;  215  . COLONOSCOPY WITH PROPOFOL N/A 12/31/2012   Procedure: COLONOSCOPY WITH PROPOFOL;  Surgeon: Rogene Houston, MD;  [facility]: [facility];  Service: Endoscopy;  Laterality: N/A;  in cecum at 0814, total withdrawal time 26min  . COLONOSCOPY WITH PROPOFOL N/A 02/05/2018   Procedure: COLONOSCOPY WITH PROPOFOL;  Surgeon: Rogene Houston, MD;  [facility];  Service: Endoscopy;  Laterality: N/A;  1:25  . CORONARY ANGIOPLASTY WITH STENT PLACEMENT    . ESOPHAGEAL DILATION N/A 06/07/2015   Procedure: ESOPHAGEAL DILATION;  Surgeon: Rogene Houston, MD;  [facility];  Service: Endoscopy;  Laterality: N/A;  . ESOPHAGOGASTRODUODENOSCOPY N/A 06/07/2015   Procedure: ESOPHAGOGASTRODUODENOSCOPY (EGD);  Surgeon: Rogene Houston, MD;  [facility];  Service: Endoscopy;  Laterality: N/A;  2:45 - moved to 1:00 - Ann to notify pt  . ESOPHAGOGASTRODUODENOSCOPY (EGD) WITH PROPOFOL N/A 12/31/2012   Procedure: ESOPHAGOGASTRODUODENOSCOPY (EGD) WITH PROPOFOL;  Surgeon: Rogene Houston, MD;  [facility];  Service: Endoscopy;  Laterality: N/A;  GE junction 37,  . ESOPHAGOGASTRODUODENOSCOPY (EGD) WITH PROPOFOL N/A 07/01/2013   Procedure: ESOPHAGOGASTRODUODENOSCOPY (EGD) WITH PROPOFOL;  Surgeon: Rogene Houston, MD;  [facility];  Service: Endoscopy;  Laterality: N/A;  950  . MALONEY DILATION N/A 12/31/2012   Procedure: MALONEY DILATION;  Surgeon: Rogene Houston, MD;  [facility];  Service: Endoscopy;  Laterality: N/A;  used a # 54,#56  . MALONEY DILATION N/A 07/01/2013   Procedure: Venia Minks DILATION;   Surgeon: Rogene Houston, MD;  [facility];  Service: Endoscopy;  Laterality: N/A;  54/58; no heme present  . POLYPECTOMY  02/05/2018   Procedure: POLYPECTOMY;  Surgeon: Rogene Houston, MD;  [facility];  Service: Endoscopy;;  distal rectum (HS)   Past Medical History:  Diagnosis Date  . Anxiety and depression   . Arthritis   . CAD (coronary artery disease)    Stent placement circumflex coronary 2007, catheterization 2008 patent stents. Normal LV function  . Chest pain   . CHF (congestive heart failure) ([place])   . COPD (chronic obstructive pulmonary disease) ([facility])    no home O2  . Depression   . Diabetes mellitus    Insulin dependent  . Diabetic polyneuropathy ([place])  severe on multiple medications  . Dyslipidemia   . GERD (gastroesophageal reflux disease)   . Headache(784.0)   . 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 Hypertension   . MI (myocardial infarction) ([place])    2007  . Obstructive sleep apnea    CPAP, setting ?2  . Palpitations   . Tachycardia    BP (!) 152/76   Pulse 66   Temp (!) 97.2 F (36.2 C)   Resp 14   Ht 5\' 2"  (1.575 m)   Wt 234 lb (106.1 kg)   SpO2 97%   BMI 42.80 kg/m   Opioid Risk Score:   Fall Risk Score:  `1  Depression screen PHQ 2/9  Depression screen [facility] 2/9 07/16/2018 06/12/2017 04/14/2017 02/17/2017 01/14/2017 12/24/2016 11/11/2016  Decreased Interest 1 3 3  0 3 0 0  Down, Depressed, Hopeless 1 3 3  0 3 0 0  PHQ - 2 Score 2 6 6  0 6 0 0  Altered sleeping - 3 - - - - -  Tired, decreased energy - 3 - - - - -  Change in appetite - 0 - - - - -  Feeling bad or failure about yourself  - 3 - - - - -  Trouble concentrating - 3 - - - - -  Moving slowly or fidgety/restless - 1 - - - - -  Suicidal thoughts - 0 - - - - -  PHQ-9 Score - 19 - - - - -  Difficult doing work/chores - Very difficult - - - - -  Some recent data might be hidden    Review of Systems  Constitutional: Negative.   Eyes: Negative.   Respiratory: Positive for apnea.    Cardiovascular: Negative.   Gastrointestinal: Positive for constipation and diarrhea.  Endocrine: Negative.   Genitourinary: Positive for difficulty urinating.       Incontinence  Musculoskeletal: Positive for arthralgias, back pain, myalgias, neck pain and neck stiffness.  Skin: Negative.   Allergic/Immunologic: Negative.   Neurological: Positive for tremors, weakness and numbness.       Tingling   Psychiatric/Behavioral: Positive for confusion and dysphoric mood. The patient is nervous/anxious.   All other systems reviewed and are negative.      Objective:   Physical Exam Vitals signs and nursing note reviewed.  Constitutional:      Appearance: Normal appearance.  Neck:     Musculoskeletal: Normal range of motion and neck supple.     Comments: Cervical Paraspinal Tenderness: C-5-C-6 Cardiovascular:     Rate and Rhythm: Normal rate and regular rhythm.     Pulses: Normal pulses.     Heart sounds: Normal heart sounds.  Musculoskeletal:     Comments: Normal Muscle Bulk and Muscle Testing Reveals:  Upper Extremities: Full ROM and Muscle Strength 5/5 Thoracic and Lumbar Hypersensitivity  Bilateral Greater Trochanter Tenderness Lower Extremities: Decreased ROM and Muscle Strength 5/5 Bilateral Lower Extremities Flexion Produces Pain into her Bilateral Patella's  Arises from Table Slowly Antalgic Gait   Skin:    General: Skin is warm and dry.  Neurological:     Mental Status: She is alert and oriented to person, place, and time.  Psychiatric:        Mood and Affect: Mood normal.        Behavior: Behavior normal.           Assessment & Plan:  1.Lumbar pain lumbar spondylosis: Encouraged to continue toincrease Activity as tolerated. 12/06/2018. Continue current medication regimen.Refilled: MS Contin  30 MG one tablet every 8 hours #90. We will continue the opioid monitoring program, this consists of regular clinic visits, examinations, urine drug screen, pill counts as  well as use of [place] Controlled Substance Reporting System. 2. Lumbar Radiculitis: Continuecurrent medication regimen withGabapentin.09/212020 3. Bilateral Knee Pain/ Degenerative: Continue Voltaren Gel. Continue to Monitor.12/06/2018 4. Severe diabetic poly neuropathy: Continuecurrent medication regimen withGabapentin.12/06/2018 5. Insomnia: ContinueTrazodone PsychiatryFollowing.12/06/2018 6.Anxiety/Depression:Lorazepam,ProzacandHydroxyzinePrescribed byPsychiatry.12/06/2018 7. De Quervains Tenosynovitis: No Complaints Today.12/06/2018 8.Bilateral Greater Trochanteric Bursitis: Continue with Ice and Heat Therapy. Continue to Monitor.12/06/2018. 9. Cervicalgia/ Cervical Radiculitis:Continue with HEP as Tolerated and continue to monitor.12/06/2018 10. Chronic Bilateral Thoracic Back Pain: Continue HEP as Tolerated. Continue current medication regimen. Continue to monitor.12/06/2018.  74minutes of face to face patient care time was spent during this visit. All questions were encouraged and answered.  F/U in 1 month

## 2018-12-08 DIAGNOSIS — I10 Essential (primary) hypertension: Secondary | ICD-10-CM | POA: Diagnosis not present

## 2018-12-19 ENCOUNTER — Other Ambulatory Visit (INDEPENDENT_AMBULATORY_CARE_PROVIDER_SITE_OTHER): Payer: Self-pay | Admitting: Nurse Practitioner

## 2018-12-19 MED ORDER — PANTOPRAZOLE SODIUM 40 MG PO TBEC: 40.0000 mg | DELAYED_RELEASE_TABLET | Freq: Two times a day (BID) | ORAL | 2 refills | Status: DC

## 2018-12-20 DIAGNOSIS — E1165 Type 2 diabetes mellitus with hyperglycemia: Secondary | ICD-10-CM | POA: Diagnosis not present

## 2018-12-20 DIAGNOSIS — I10 Essential (primary) hypertension: Secondary | ICD-10-CM | POA: Diagnosis not present

## 2018-12-20 DIAGNOSIS — Z299 Encounter for prophylactic measures, unspecified: Secondary | ICD-10-CM | POA: Diagnosis not present

## 2018-12-20 DIAGNOSIS — Z23 Encounter for immunization: Secondary | ICD-10-CM | POA: Diagnosis not present

## 2018-12-20 DIAGNOSIS — Z6841 Body Mass Index (BMI) 40.0 and over, adult: Secondary | ICD-10-CM | POA: Diagnosis not present

## 2018-12-20 DIAGNOSIS — K3184 Gastroparesis: Secondary | ICD-10-CM | POA: Diagnosis not present

## 2018-12-20 DIAGNOSIS — E1143 Type 2 diabetes mellitus with diabetic autonomic (poly)neuropathy: Secondary | ICD-10-CM | POA: Diagnosis not present

## 2018-12-21 DIAGNOSIS — Z01818 Encounter for other preprocedural examination: Secondary | ICD-10-CM | POA: Diagnosis not present

## 2018-12-23 DIAGNOSIS — I509 Heart failure, unspecified: Secondary | ICD-10-CM | POA: Diagnosis not present

## 2018-12-23 DIAGNOSIS — I2510 Atherosclerotic heart disease of native coronary artery without angina pectoris: Secondary | ICD-10-CM | POA: Diagnosis not present

## 2018-12-23 DIAGNOSIS — K6289 Other specified diseases of anus and rectum: Secondary | ICD-10-CM | POA: Diagnosis not present

## 2018-12-23 DIAGNOSIS — Z955 Presence of coronary angioplasty implant and graft: Secondary | ICD-10-CM | POA: Diagnosis not present

## 2018-12-23 DIAGNOSIS — I110 Hypertensive heart disease with heart failure: Secondary | ICD-10-CM | POA: Diagnosis not present

## 2018-12-23 DIAGNOSIS — R85612 Low grade squamous intraepithelial lesion on cytologic smear of anus (LGSIL): Secondary | ICD-10-CM | POA: Diagnosis not present

## 2018-12-23 DIAGNOSIS — J449 Chronic obstructive pulmonary disease, unspecified: Secondary | ICD-10-CM | POA: Diagnosis not present

## 2018-12-23 DIAGNOSIS — K219 Gastro-esophageal reflux disease without esophagitis: Secondary | ICD-10-CM | POA: Diagnosis not present

## 2018-12-23 DIAGNOSIS — I252 Old myocardial infarction: Secondary | ICD-10-CM | POA: Diagnosis not present

## 2018-12-23 DIAGNOSIS — E1140 Type 2 diabetes mellitus with diabetic neuropathy, unspecified: Secondary | ICD-10-CM | POA: Diagnosis not present

## 2018-12-23 DIAGNOSIS — K589 Irritable bowel syndrome without diarrhea: Secondary | ICD-10-CM | POA: Diagnosis not present

## 2018-12-23 DIAGNOSIS — Z79899 Other long term (current) drug therapy: Secondary | ICD-10-CM | POA: Diagnosis not present

## 2018-12-23 DIAGNOSIS — G4730 Sleep apnea, unspecified: Secondary | ICD-10-CM | POA: Diagnosis not present

## 2018-12-23 DIAGNOSIS — K6282 Dysplasia of anus: Secondary | ICD-10-CM | POA: Diagnosis not present

## 2018-12-23 DIAGNOSIS — E7800 Pure hypercholesterolemia, unspecified: Secondary | ICD-10-CM | POA: Diagnosis not present

## 2018-12-28 ENCOUNTER — Other Ambulatory Visit: Payer: Self-pay | Admitting: Physical Medicine & Rehabilitation

## 2018-12-28 ENCOUNTER — Other Ambulatory Visit: Payer: Self-pay | Admitting: "Endocrinology

## 2019-01-03 DIAGNOSIS — E1165 Type 2 diabetes mellitus with hyperglycemia: Secondary | ICD-10-CM | POA: Diagnosis not present

## 2019-01-03 DIAGNOSIS — Z6841 Body Mass Index (BMI) 40.0 and over, adult: Secondary | ICD-10-CM | POA: Diagnosis not present

## 2019-01-03 DIAGNOSIS — E1142 Type 2 diabetes mellitus with diabetic polyneuropathy: Secondary | ICD-10-CM | POA: Diagnosis not present

## 2019-01-03 DIAGNOSIS — Z299 Encounter for prophylactic measures, unspecified: Secondary | ICD-10-CM | POA: Diagnosis not present

## 2019-01-03 DIAGNOSIS — G4730 Sleep apnea, unspecified: Secondary | ICD-10-CM | POA: Diagnosis not present

## 2019-01-03 DIAGNOSIS — I10 Essential (primary) hypertension: Secondary | ICD-10-CM | POA: Diagnosis not present

## 2019-01-05 ENCOUNTER — Encounter: Payer: Self-pay | Admitting: Registered Nurse

## 2019-01-05 ENCOUNTER — Other Ambulatory Visit: Payer: Self-pay

## 2019-01-05 ENCOUNTER — Encounter: Payer: Medicare Other | Attending: Registered Nurse | Admitting: Registered Nurse

## 2019-01-05 VITALS — BP 150/72 | HR 64 | Ht 64.0 in | Wt 236.0 lb

## 2019-01-05 DIAGNOSIS — I25118 Atherosclerotic heart disease of native coronary artery with other forms of angina pectoris: Secondary | ICD-10-CM | POA: Diagnosis not present

## 2019-01-05 DIAGNOSIS — K219 Gastro-esophageal reflux disease without esophagitis: Secondary | ICD-10-CM | POA: Insufficient documentation

## 2019-01-05 DIAGNOSIS — I252 Old myocardial infarction: Secondary | ICD-10-CM | POA: Insufficient documentation

## 2019-01-05 DIAGNOSIS — M546 Pain in thoracic spine: Secondary | ICD-10-CM | POA: Diagnosis not present

## 2019-01-05 DIAGNOSIS — M7062 Trochanteric bursitis, left hip: Secondary | ICD-10-CM | POA: Diagnosis not present

## 2019-01-05 DIAGNOSIS — I509 Heart failure, unspecified: Secondary | ICD-10-CM | POA: Diagnosis not present

## 2019-01-05 DIAGNOSIS — M545 Low back pain: Secondary | ICD-10-CM | POA: Insufficient documentation

## 2019-01-05 DIAGNOSIS — G4700 Insomnia, unspecified: Secondary | ICD-10-CM | POA: Diagnosis not present

## 2019-01-05 DIAGNOSIS — Z823 Family history of stroke: Secondary | ICD-10-CM | POA: Diagnosis not present

## 2019-01-05 DIAGNOSIS — G894 Chronic pain syndrome: Secondary | ICD-10-CM

## 2019-01-05 DIAGNOSIS — M5416 Radiculopathy, lumbar region: Secondary | ICD-10-CM

## 2019-01-05 DIAGNOSIS — M7061 Trochanteric bursitis, right hip: Secondary | ICD-10-CM | POA: Diagnosis not present

## 2019-01-05 DIAGNOSIS — E785 Hyperlipidemia, unspecified: Secondary | ICD-10-CM | POA: Diagnosis not present

## 2019-01-05 DIAGNOSIS — I2510 Atherosclerotic heart disease of native coronary artery without angina pectoris: Secondary | ICD-10-CM | POA: Insufficient documentation

## 2019-01-05 DIAGNOSIS — J449 Chronic obstructive pulmonary disease, unspecified: Secondary | ICD-10-CM | POA: Insufficient documentation

## 2019-01-05 DIAGNOSIS — Z5181 Encounter for therapeutic drug level monitoring: Secondary | ICD-10-CM | POA: Diagnosis not present

## 2019-01-05 DIAGNOSIS — E1142 Type 2 diabetes mellitus with diabetic polyneuropathy: Secondary | ICD-10-CM | POA: Insufficient documentation

## 2019-01-05 DIAGNOSIS — M542 Cervicalgia: Secondary | ICD-10-CM | POA: Diagnosis not present

## 2019-01-05 DIAGNOSIS — F419 Anxiety disorder, unspecified: Secondary | ICD-10-CM | POA: Insufficient documentation

## 2019-01-05 DIAGNOSIS — F329 Major depressive disorder, single episode, unspecified: Secondary | ICD-10-CM | POA: Diagnosis not present

## 2019-01-05 DIAGNOSIS — I110 Hypertensive heart disease with heart failure: Secondary | ICD-10-CM | POA: Diagnosis not present

## 2019-01-05 DIAGNOSIS — M1712 Unilateral primary osteoarthritis, left knee: Secondary | ICD-10-CM

## 2019-01-05 DIAGNOSIS — G8929 Other chronic pain: Secondary | ICD-10-CM | POA: Diagnosis not present

## 2019-01-05 DIAGNOSIS — M25561 Pain in right knee: Secondary | ICD-10-CM | POA: Diagnosis not present

## 2019-01-05 DIAGNOSIS — M1711 Unilateral primary osteoarthritis, right knee: Secondary | ICD-10-CM

## 2019-01-05 DIAGNOSIS — Z8249 Family history of ischemic heart disease and other diseases of the circulatory system: Secondary | ICD-10-CM | POA: Insufficient documentation

## 2019-01-05 DIAGNOSIS — M25562 Pain in left knee: Secondary | ICD-10-CM | POA: Insufficient documentation

## 2019-01-05 DIAGNOSIS — M48062 Spinal stenosis, lumbar region with neurogenic claudication: Secondary | ICD-10-CM | POA: Diagnosis not present

## 2019-01-05 DIAGNOSIS — Z79891 Long term (current) use of opiate analgesic: Secondary | ICD-10-CM

## 2019-01-05 DIAGNOSIS — G4733 Obstructive sleep apnea (adult) (pediatric): Secondary | ICD-10-CM | POA: Diagnosis not present

## 2019-01-05 DIAGNOSIS — F411 Generalized anxiety disorder: Secondary | ICD-10-CM | POA: Diagnosis not present

## 2019-01-05 DIAGNOSIS — Z955 Presence of coronary angioplasty implant and graft: Secondary | ICD-10-CM | POA: Diagnosis not present

## 2019-01-05 DIAGNOSIS — M4726 Other spondylosis with radiculopathy, lumbar region: Secondary | ICD-10-CM | POA: Diagnosis not present

## 2019-01-05 MED ORDER — MORPHINE SULFATE ER 30 MG PO TBCR: 30.0000 mg | EXTENDED_RELEASE_TABLET | Freq: Three times a day (TID) | ORAL | 0 refills | Status: DC

## 2019-01-05 NOTE — Progress Notes (Signed)
Subjective:    Patient ID: [name], female    DOB: 01/28/1951, 68 y.o.   MRN: [id number]  HPI: [name] is a 68 y.o. female who returns for follow up appointment for chronic pain and medication refill. She states her pain is located in her neck, lower back pain radiating into her bilateral lower extremities, bilateral knee pain and bilateral feet pain with tingling and burning. She rates her pain 7. Her current exercise regime is walking and performing stretching exercises.  [name] Morphine equivalent is 90.00 MME. He  is also prescribed Lorazepam  by [name].We have discussed the black box warning of using opioids and benzodiazepines. I highlighted the dangers of using these drugs together and discussed the adverse events including respiratory suppression, overdose, cognitive impairment and importance of compliance with current regimen. We will continue to monitor and adjust as indicated.  She  is being closely monitored and under the care of her psychiatrist [name].  .  Pain Inventory Average Pain 7 Pain Right Now 7 My pain is sharp, burning, dull, stabbing, tingling and aching  In the last 24 hours, has pain interfered with the following? General activity 5 Relation with others 5 Enjoyment of life 2 What TIME of day is your pain at its worst? night Sleep (in general) Poor  Pain is worse with: walking, bending, inactivity and some activites Pain improves with: rest, heat/ice and medication Relief from Meds: 5  Mobility walk without assistance walk with assistance use a cane how many minutes can you walk? 15 ability to climb steps?  no do you drive?  yes  Function not employed: date last employed . disabled: date disabled . I need assistance with the following:  meal prep, household duties and shopping  Neuro/Psych bladder control problems weakness numbness tremor tingling trouble walking spasms dizziness confusion depression anxiety loss  of taste or smell  Prior Studies Any changes since last visit?  no  Physicians involved in your care Any changes since last visit?  no   Family History  Problem Relation Age of Onset  . Heart attack Mother   . Colon cancer Mother   . Heart attack Father   . Stroke Sister   . Hemophilia Child   . Cancer Other   . Coronary artery disease Other   . Cancer Brother   . Cancer Maternal Aunt   . Cancer Maternal Uncle   . Cancer Paternal Aunt   . Cancer Paternal Uncle   . Cancer Brother    Social History   Socioeconomic History  . Marital status: Legally Separated    Spouse name: Not on file  . Number of children: 4  . Years of education: Not on file  . Highest education level: Not on file  Occupational History  . Occupation: Disabled  Social Needs  . Financial resource strain: Not on file  . Food insecurity    Worry: Not on file    Inability: Not on file  . Transportation needs    Medical: Not on file    Non-medical: Not on file  Tobacco Use  . Smoking status: Never Smoker  . Smokeless tobacco: Never Used  Substance and Sexual Activity  . Alcohol use: No    Alcohol/week: 0.0 standard drinks  . Drug use: No  . Sexual activity: Not on file  Lifestyle  . Physical activity    Days per week: Not on file    Minutes per session: Not on file  .  Stress: Not on file  Relationships  . Social Herbalist on phone: Not on file    Gets together: Not on file    Attends religious service: Not on file    Active member of club or organization: Not on file    Attends meetings of clubs or organizations: Not on file    Relationship status: Not on file  Other Topics Concern  . Not on file  Social History Narrative   Patient does not get regular exercise   Denies caffeine use    Past Surgical History:  Procedure Laterality Date  . ABDOMINAL HYSTERECTOMY    . Arm Surgery Bilateral    due to fall-pt broke both forearms  . BIOPSY N/A 07/01/2013   Procedure: BIOPSY;   Surgeon: Rogene Houston, MD;  [facility];  Service: Endoscopy;  Laterality: N/A;  . CHOLECYSTECTOMY    . COLONOSCOPY  08/06/2011   Procedure: COLONOSCOPY;  Surgeon: Rogene Houston, MD;  [facility];  Service: Endoscopy;  Laterality: N/A;  215  . COLONOSCOPY WITH PROPOFOL N/A 12/31/2012   Procedure: COLONOSCOPY WITH PROPOFOL;  Surgeon: Rogene Houston, MD;  [facility]: [facility];  Service: Endoscopy;  Laterality: N/A;  in cecum at 0814, total withdrawal time 70min  . COLONOSCOPY WITH PROPOFOL N/A 02/05/2018   Procedure: COLONOSCOPY WITH PROPOFOL;  Surgeon: Rogene Houston, MD;  [facility];  Service: Endoscopy;  Laterality: N/A;  1:25  . CORONARY ANGIOPLASTY WITH STENT PLACEMENT    . ESOPHAGEAL DILATION N/A 06/07/2015   Procedure: ESOPHAGEAL DILATION;  Surgeon: Rogene Houston, MD;  [facility];  Service: Endoscopy;  Laterality: N/A;  . ESOPHAGOGASTRODUODENOSCOPY N/A 06/07/2015   Procedure: ESOPHAGOGASTRODUODENOSCOPY (EGD);  Surgeon: Rogene Houston, MD;  [facility];  Service: Endoscopy;  Laterality: N/A;  2:45 - moved to 1:00 - Ann to notify pt  . ESOPHAGOGASTRODUODENOSCOPY (EGD) WITH PROPOFOL N/A 12/31/2012   Procedure: ESOPHAGOGASTRODUODENOSCOPY (EGD) WITH PROPOFOL;  Surgeon: Rogene Houston, MD;  [facility];  Service: Endoscopy;  Laterality: N/A;  GE junction 37,  . ESOPHAGOGASTRODUODENOSCOPY (EGD) WITH PROPOFOL N/A 07/01/2013   Procedure: ESOPHAGOGASTRODUODENOSCOPY (EGD) WITH PROPOFOL;  Surgeon: Rogene Houston, MD;  [facility];  Service: Endoscopy;  Laterality: N/A;  950  . MALONEY DILATION N/A 12/31/2012   Procedure: MALONEY DILATION;  Surgeon: Rogene Houston, MD;  [facility];  Service: Endoscopy;  Laterality: N/A;  used a # 54,#56  . MALONEY DILATION N/A 07/01/2013   Procedure: Venia Minks DILATION;  Surgeon: Rogene Houston, MD;  [facility];  Service: Endoscopy;  Laterality: N/A;  54/58; no heme present  . POLYPECTOMY   02/05/2018   Procedure: POLYPECTOMY;  Surgeon: Rogene Houston, MD;  [facility];  Service: Endoscopy;;  distal rectum (HS)   Past Medical History:  Diagnosis Date  . Anxiety and depression   . Arthritis   . CAD (coronary artery disease)    Stent placement circumflex coronary 2007, catheterization 2008 patent stents. Normal LV function  . Chest pain   . CHF (congestive heart failure) ([place])   . COPD (chronic obstructive pulmonary disease) ([facility])    no home O2  . Depression   . Diabetes mellitus    Insulin dependent  . Diabetic polyneuropathy ([facility])     severe on multiple medications  . Dyslipidemia   . GERD (gastroesophageal reflux disease)   . Headache(784.0)   . 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  Hypertension   . MI (myocardial infarction) ([place])    2007  . Obstructive sleep apnea    CPAP, setting ?2  . Palpitations   . Tachycardia    BP (!) 150/72   Pulse 64   Ht 5\' 4"  (1.626 m)   Wt 236 lb (107 kg)   SpO2 96%   BMI 40.51 kg/m   Opioid Risk Score:   Fall Risk Score:  `1  Depression screen PHQ 2/9  Depression screen [facility] 2/9 07/16/2018 06/12/2017 04/14/2017 02/17/2017 01/14/2017 12/24/2016 11/11/2016  Decreased Interest 1 3 3  0 3 0 0  Down, Depressed, Hopeless 1 3 3  0 3 0 0  PHQ - 2 Score 2 6 6  0 6 0 0  Altered sleeping - 3 - - - - -  Tired, decreased energy - 3 - - - - -  Change in appetite - 0 - - - - -  Feeling bad or failure about yourself  - 3 - - - - -  Trouble concentrating - 3 - - - - -  Moving slowly or fidgety/restless - 1 - - - - -  Suicidal thoughts - 0 - - - - -  PHQ-9 Score - 19 - - - - -  Difficult doing work/chores - Very difficult - - - - -  Some recent data might be hidden    Review of Systems  Constitutional: Negative.   HENT: Negative.   Eyes: Negative.   Respiratory: Negative.   Cardiovascular: Negative.   Gastrointestinal: Positive for nausea.  Endocrine:       Diabetic   Genitourinary: Negative.   Musculoskeletal: Positive for  arthralgias, back pain, gait problem, myalgias, neck pain and neck stiffness.       Spasms   Allergic/Immunologic: Negative.   Neurological: Positive for dizziness, tremors, weakness and numbness.       Tingling  Psychiatric/Behavioral: Positive for confusion and dysphoric mood. The patient is nervous/anxious.   All other systems reviewed and are negative.      Objective:   Physical Exam Vitals signs and nursing note reviewed.  Constitutional:      Appearance: Normal appearance. She is obese.  Neck:     Musculoskeletal: Normal range of motion and neck supple.     Comments: Cervical Paraspinal Tenderness: C-5-C-6 Cardiovascular:     Rate and Rhythm: Normal rate and regular rhythm.     Pulses: Normal pulses.     Heart sounds: Normal heart sounds.  Pulmonary:     Effort: Pulmonary effort is normal.     Breath sounds: Normal breath sounds.  Musculoskeletal:     Comments: Normal Muscle Bulk and Muscle Testing Reveals:  Upper Extremities: Full ROM and Muscle Strength 5/5 Thoracic Paraspinal Tenderness: T-7-T-9 Lumbar Hypersensitivity Lower Extremities: Full ROM and Muscle Strength 5/5 Arises from Table Slowly Antalgic  Gait   Skin:    General: Skin is warm and dry.  Neurological:     Mental Status: She is alert and oriented to person, place, and time.  Psychiatric:        Mood and Affect: Mood normal.        Behavior: Behavior normal.           Assessment & Plan:  1.Lumbar pain lumbar spondylosis: Encouraged to continue toincrease Activity as tolerated. 01/05/2019. Continue current medication regimen.Refilled: MS Contin 30 MG one tablet every 8 hours #90. We will continue the opioid monitoring program, this consists of regular clinic visits, examinations, urine drug screen, pill  counts as well as use of [place] Controlled Substance Reporting System. 2. Lumbar Radiculitis: Continuecurrent medication regimen withGabapentin.10/212020 3. Bilateral Knee Pain/  Degenerative: Continue Voltaren Gel. Continue to Monitor.01/05/2019 4. Severe diabetic poly neuropathy: Continuecurrent medication regimen withGabapentin.010/21/2020 5. Insomnia: ContinueTrazodone PsychiatryFollowing.01/05/2019 6.Anxiety/Depression:Lorazepam,ProzacandHydroxyzinePrescribed byPsychiatry.01/05/2019 7. De Quervains Tenosynovitis: No Complaints Today.01/05/2019 8.BilateralGreater Trochanteric Bursitis: No complaints today. Continue with Ice and Heat Therapy. Continue to Monitor.01/05/2019. 9. Cervicalgia/ Cervical Radiculitis:Continue with HEP as Tolerated and continue to monitor.01/05/2019 10. Chronic Bilateral Thoracic Back Pain: Continue HEP as Tolerated. Continue current medication regimen. Continue to monitor.01/05/2019.  82minutes of face to face patient care time was spent during this visit. All questions were encouraged and answered.  F/U in 1 month

## 2019-01-08 LAB — TOXASSURE SELECT,+ANTIDEPR,UR

## 2019-01-10 DIAGNOSIS — I10 Essential (primary) hypertension: Secondary | ICD-10-CM | POA: Diagnosis not present

## 2019-01-12 ENCOUNTER — Telehealth: Payer: Self-pay | Admitting: *Deleted

## 2019-01-12 NOTE — Telephone Encounter (Signed)
Urine drug screen for this encounter is consistent for prescribed medication 

## 2019-01-15 ENCOUNTER — Other Ambulatory Visit: Payer: Self-pay | Admitting: "Endocrinology

## 2019-01-15 DIAGNOSIS — E1159 Type 2 diabetes mellitus with other circulatory complications: Secondary | ICD-10-CM

## 2019-01-19 DIAGNOSIS — Z299 Encounter for prophylactic measures, unspecified: Secondary | ICD-10-CM | POA: Diagnosis not present

## 2019-01-19 DIAGNOSIS — I10 Essential (primary) hypertension: Secondary | ICD-10-CM | POA: Diagnosis not present

## 2019-01-19 DIAGNOSIS — R519 Headache, unspecified: Secondary | ICD-10-CM | POA: Diagnosis not present

## 2019-01-19 DIAGNOSIS — Z6841 Body Mass Index (BMI) 40.0 and over, adult: Secondary | ICD-10-CM | POA: Diagnosis not present

## 2019-01-21 DIAGNOSIS — M1611 Unilateral primary osteoarthritis, right hip: Secondary | ICD-10-CM | POA: Diagnosis not present

## 2019-01-21 DIAGNOSIS — M25551 Pain in right hip: Secondary | ICD-10-CM | POA: Diagnosis not present

## 2019-01-27 DIAGNOSIS — G4730 Sleep apnea, unspecified: Secondary | ICD-10-CM | POA: Diagnosis not present

## 2019-01-27 DIAGNOSIS — F331 Major depressive disorder, recurrent, moderate: Secondary | ICD-10-CM | POA: Diagnosis not present

## 2019-01-30 ENCOUNTER — Other Ambulatory Visit: Payer: Self-pay | Admitting: Cardiovascular Disease

## 2019-01-31 ENCOUNTER — Other Ambulatory Visit (HOSPITAL_COMMUNITY): Payer: Self-pay | Admitting: Psychiatry

## 2019-02-01 ENCOUNTER — Other Ambulatory Visit: Payer: Self-pay

## 2019-02-01 ENCOUNTER — Encounter: Payer: Medicare Other | Attending: Registered Nurse | Admitting: Registered Nurse

## 2019-02-01 VITALS — BP 146/77 | HR 61 | Temp 97.7°F | Ht 64.0 in | Wt 237.2 lb

## 2019-02-01 DIAGNOSIS — I252 Old myocardial infarction: Secondary | ICD-10-CM | POA: Diagnosis not present

## 2019-02-01 DIAGNOSIS — E785 Hyperlipidemia, unspecified: Secondary | ICD-10-CM | POA: Insufficient documentation

## 2019-02-01 DIAGNOSIS — J449 Chronic obstructive pulmonary disease, unspecified: Secondary | ICD-10-CM | POA: Diagnosis not present

## 2019-02-01 DIAGNOSIS — I2510 Atherosclerotic heart disease of native coronary artery without angina pectoris: Secondary | ICD-10-CM | POA: Insufficient documentation

## 2019-02-01 DIAGNOSIS — M545 Low back pain: Secondary | ICD-10-CM | POA: Insufficient documentation

## 2019-02-01 DIAGNOSIS — E1142 Type 2 diabetes mellitus with diabetic polyneuropathy: Secondary | ICD-10-CM | POA: Insufficient documentation

## 2019-02-01 DIAGNOSIS — I110 Hypertensive heart disease with heart failure: Secondary | ICD-10-CM | POA: Diagnosis not present

## 2019-02-01 DIAGNOSIS — G4733 Obstructive sleep apnea (adult) (pediatric): Secondary | ICD-10-CM | POA: Insufficient documentation

## 2019-02-01 DIAGNOSIS — Z79891 Long term (current) use of opiate analgesic: Secondary | ICD-10-CM

## 2019-02-01 DIAGNOSIS — K219 Gastro-esophageal reflux disease without esophagitis: Secondary | ICD-10-CM | POA: Insufficient documentation

## 2019-02-01 DIAGNOSIS — F329 Major depressive disorder, single episode, unspecified: Secondary | ICD-10-CM | POA: Insufficient documentation

## 2019-02-01 DIAGNOSIS — M48062 Spinal stenosis, lumbar region with neurogenic claudication: Secondary | ICD-10-CM

## 2019-02-01 DIAGNOSIS — Z955 Presence of coronary angioplasty implant and graft: Secondary | ICD-10-CM | POA: Diagnosis not present

## 2019-02-01 DIAGNOSIS — F411 Generalized anxiety disorder: Secondary | ICD-10-CM | POA: Diagnosis not present

## 2019-02-01 DIAGNOSIS — M1711 Unilateral primary osteoarthritis, right knee: Secondary | ICD-10-CM | POA: Diagnosis not present

## 2019-02-01 DIAGNOSIS — G894 Chronic pain syndrome: Secondary | ICD-10-CM

## 2019-02-01 DIAGNOSIS — Z5181 Encounter for therapeutic drug level monitoring: Secondary | ICD-10-CM | POA: Diagnosis not present

## 2019-02-01 DIAGNOSIS — M546 Pain in thoracic spine: Secondary | ICD-10-CM | POA: Diagnosis not present

## 2019-02-01 DIAGNOSIS — M4726 Other spondylosis with radiculopathy, lumbar region: Secondary | ICD-10-CM | POA: Diagnosis not present

## 2019-02-01 DIAGNOSIS — G8929 Other chronic pain: Secondary | ICD-10-CM | POA: Diagnosis not present

## 2019-02-01 DIAGNOSIS — M5416 Radiculopathy, lumbar region: Secondary | ICD-10-CM | POA: Diagnosis not present

## 2019-02-01 DIAGNOSIS — M7061 Trochanteric bursitis, right hip: Secondary | ICD-10-CM | POA: Diagnosis not present

## 2019-02-01 DIAGNOSIS — M25561 Pain in right knee: Secondary | ICD-10-CM | POA: Diagnosis not present

## 2019-02-01 DIAGNOSIS — M542 Cervicalgia: Secondary | ICD-10-CM | POA: Diagnosis not present

## 2019-02-01 DIAGNOSIS — I25118 Atherosclerotic heart disease of native coronary artery with other forms of angina pectoris: Secondary | ICD-10-CM

## 2019-02-01 DIAGNOSIS — M25562 Pain in left knee: Secondary | ICD-10-CM | POA: Diagnosis not present

## 2019-02-01 DIAGNOSIS — Z8249 Family history of ischemic heart disease and other diseases of the circulatory system: Secondary | ICD-10-CM | POA: Diagnosis not present

## 2019-02-01 DIAGNOSIS — M1712 Unilateral primary osteoarthritis, left knee: Secondary | ICD-10-CM | POA: Diagnosis not present

## 2019-02-01 DIAGNOSIS — M7062 Trochanteric bursitis, left hip: Secondary | ICD-10-CM | POA: Diagnosis not present

## 2019-02-01 DIAGNOSIS — M5412 Radiculopathy, cervical region: Secondary | ICD-10-CM

## 2019-02-01 DIAGNOSIS — Z823 Family history of stroke: Secondary | ICD-10-CM | POA: Diagnosis not present

## 2019-02-01 DIAGNOSIS — F419 Anxiety disorder, unspecified: Secondary | ICD-10-CM | POA: Insufficient documentation

## 2019-02-01 DIAGNOSIS — I509 Heart failure, unspecified: Secondary | ICD-10-CM | POA: Insufficient documentation

## 2019-02-01 DIAGNOSIS — G4700 Insomnia, unspecified: Secondary | ICD-10-CM | POA: Diagnosis not present

## 2019-02-01 MED ORDER — MORPHINE SULFATE ER 30 MG PO TBCR: 30.0000 mg | EXTENDED_RELEASE_TABLET | Freq: Three times a day (TID) | ORAL | 0 refills | Status: DC

## 2019-02-01 NOTE — Progress Notes (Signed)
Subjective:    Patient ID: [name], female    DOB: [birth year], 68 y.o.   MRN: [id number]  HPI: [name] is a 68 y.o. female who returns for follow up appointment for chronic pain and medication refill. She states her pain is located in her neck radiating into her right shoulder, mid-lower back pain radiating into her right lower extremity, bilateral hip pain pain and bilateral knee pain. She rates her pain 6. Her current exercise regime is walking and performing stretching exercises.  Ms. Leist Morphine equivalent is 90.00 MME.  She is also prescribed Lorazepam by Noemi Chapel. We have discussed the black box warning of using opioids and benzodiazepines. I highlighted the dangers of using these drugs together and discussed the adverse events including respiratory suppression, overdose, cognitive impairment and importance of compliance with current regimen. We will continue to monitor and adjust as indicated.   She  is being closely monit ored and under the care of her psychiatrist Dr. Reece Levy.      Pain Inventory Average Pain 6 Pain Right Now 6 My pain is sharp, burning, dull, stabbing, tingling and aching  In the last 24 hours, has pain interfered with the following? General activity 7 Relation with others 4 Enjoyment of life 4 What TIME of day is your pain at its worst? morning and night Sleep (in general) Poor  Pain is worse with: walking, bending, sitting and inactivity Pain improves with: heat/ice and medication Relief from Meds: 3  Mobility walk with assistance use a cane use a walker how many minutes can you walk? 10 ability to climb steps?  no do you drive?  yes use a wheelchair transfers alone  Function disabled: date disabled . I need assistance with the following:  meal prep, household duties and shopping  Neuro/Psych bladder control problems bowel control problems weakness numbness tremor tingling trouble walking spasms dizziness confusion  depression anxiety loss of taste or smell  Prior Studies Any changes since last visit?  no  Physicians involved in your care Any changes since last visit?  no   Family History  Problem Relation Age of Onset  . Heart attack Mother   . Colon cancer Mother   . Heart attack Father   . Stroke Sister   . Hemophilia Child   . Cancer Other   . Coronary artery disease Other   . Cancer Brother   . Cancer Maternal Aunt   . Cancer Maternal Uncle   . Cancer Paternal Aunt   . Cancer Paternal Uncle   . Cancer Brother    Social History   Socioeconomic History  . Marital status: Legally Separated    Spouse name: Not on file  . Number of children: 4  . Years of education: Not on file  . Highest education level: Not on file  Occupational History  . Occupation: Disabled  Social Needs  . Financial resource strain: Not on file  . Food insecurity    Worry: Not on file    Inability: Not on file  . Transportation needs    Medical: Not on file    Non-medical: Not on file  Tobacco Use  . Smoking status: Never Smoker  . Smokeless tobacco: Never Used  Substance and Sexual Activity  . Alcohol use: No    Alcohol/week: 0.0 standard drinks  . Drug use: No  . Sexual activity: Not on file  Lifestyle  . Physical activity    Days per week: Not on file  Minutes per session: Not on file  . Stress: Not on file  Relationships  . Social Herbalist on phone: Not on file    Gets together: Not on file    Attends religious service: Not on file    Active member of club or organization: Not on file    Attends meetings of clubs or organizations: Not on file    Relationship status: Not on file  Other Topics Concern  . Not on file  Social History Narrative   Patient does not get regular exercise   Denies caffeine use    Past Surgical History:  Procedure Laterality Date  . ABDOMINAL HYSTERECTOMY    . Arm Surgery Bilateral    due to fall-pt broke both forearms  . BIOPSY N/A  07/01/2013   Procedure: BIOPSY;  Surgeon: Rogene Houston, MD;  [facility];  Service: Endoscopy;  Laterality: N/A;  . CHOLECYSTECTOMY    . COLONOSCOPY  08/06/2011   Procedure: COLONOSCOPY;  Surgeon: Rogene Houston, MD;  [facility];  Service: Endoscopy;  Laterality: N/A;  215  . COLONOSCOPY WITH PROPOFOL N/A 12/31/2012   Procedure: COLONOSCOPY WITH PROPOFOL;  Surgeon: Rogene Houston, MD;  [facility]: [facility];  Service: Endoscopy;  Laterality: N/A;  in cecum at 0814, total withdrawal time 59min  . COLONOSCOPY WITH PROPOFOL N/A 02/05/2018   Procedure: COLONOSCOPY WITH PROPOFOL;  Surgeon: Rogene Houston, MD;  [facility];  Service: Endoscopy;  Laterality: N/A;  1:25  . CORONARY ANGIOPLASTY WITH STENT PLACEMENT    . ESOPHAGEAL DILATION N/A 06/07/2015   Procedure: ESOPHAGEAL DILATION;  Surgeon: Rogene Houston, MD;  [facility];  Service: Endoscopy;  Laterality: N/A;  . ESOPHAGOGASTRODUODENOSCOPY N/A 06/07/2015   Procedure: ESOPHAGOGASTRODUODENOSCOPY (EGD);  Surgeon: Rogene Houston, MD;  [facility];  Service: Endoscopy;  Laterality: N/A;  2:45 - moved to 1:00 - Ann to notify pt  . ESOPHAGOGASTRODUODENOSCOPY (EGD) WITH PROPOFOL N/A 12/31/2012   Procedure: ESOPHAGOGASTRODUODENOSCOPY (EGD) WITH PROPOFOL;  Surgeon: Rogene Houston, MD;  [facility];  Service: Endoscopy;  Laterality: N/A;  GE junction 37,  . ESOPHAGOGASTRODUODENOSCOPY (EGD) WITH PROPOFOL N/A 07/01/2013   Procedure: ESOPHAGOGASTRODUODENOSCOPY (EGD) WITH PROPOFOL;  Surgeon: Rogene Houston, MD;  [facility];  Service: Endoscopy;  Laterality: N/A;  950  . MALONEY DILATION N/A 12/31/2012   Procedure: MALONEY DILATION;  Surgeon: Rogene Houston, MD;  [facility];  Service: Endoscopy;  Laterality: N/A;  used a # 54,#56  . MALONEY DILATION N/A 07/01/2013   Procedure: Venia Minks DILATION;  Surgeon: Rogene Houston, MD;  [facility];  Service: Endoscopy;  Laterality: N/A;  54/58;  no heme present  . POLYPECTOMY  02/05/2018   Procedure: POLYPECTOMY;  Surgeon: Rogene Houston, MD;  [facility];  Service: Endoscopy;;  distal rectum (HS)   Past Medical History:  Diagnosis Date  . Anxiety and depression   . Arthritis   . CAD (coronary artery disease)    Stent placement circumflex coronary 2007, catheterization 2008 patent stents. Normal LV function  . Chest pain   . CHF (congestive heart failure) ([place])   . COPD (chronic obstructive pulmonary disease) ([facility])    no home O2  . Depression   . Diabetes mellitus    Insulin dependent  . Diabetic polyneuropathy ([facility])     severe on multiple medications  . Dyslipidemia   . GERD (gastroesophageal reflux disease)   .  Headache(784.0)   . 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 Hypertension   . MI (myocardial infarction) ([place])    2007  . Obstructive sleep apnea    CPAP, setting ?2  . Palpitations   . Tachycardia    BP (!) 146/77   Pulse 61   Temp 97.7 F (36.5 C)   Ht 5\' 4"  (1.626 m)   Wt 237 lb 3.2 oz (107.6 kg)   SpO2 96%   BMI 40.72 kg/m   Opioid Risk Score:   Fall Risk Score:  `1  Depression screen PHQ 2/9  Depression screen [facility] 2/9 07/16/2018 06/12/2017 04/14/2017 02/17/2017 01/14/2017 12/24/2016 11/11/2016  Decreased Interest 1 3 3  0 3 0 0  Down, Depressed, Hopeless 1 3 3  0 3 0 0  PHQ - 2 Score 2 6 6  0 6 0 0  Altered sleeping - 3 - - - - -  Tired, decreased energy - 3 - - - - -  Change in appetite - 0 - - - - -  Feeling bad or failure about yourself  - 3 - - - - -  Trouble concentrating - 3 - - - - -  Moving slowly or fidgety/restless - 1 - - - - -  Suicidal thoughts - 0 - - - - -  PHQ-9 Score - 19 - - - - -  Difficult doing work/chores - Very difficult - - - - -  Some recent data might be hidden    Review of Systems  Musculoskeletal: Positive for gait problem.  Neurological: Positive for tremors, weakness and numbness.  Psychiatric/Behavioral: Positive for confusion and dysphoric mood. The patient  is nervous/anxious.   All other systems reviewed and are negative.      Objective:   Physical Exam Vitals signs and nursing note reviewed.  Constitutional:      Appearance: Normal appearance. She is obese.  Neck:     Musculoskeletal: Normal range of motion and neck supple.     Comments: Cervical Paraspinal Tenderness: C-5-C-6 Cardiovascular:     Rate and Rhythm: Normal rate and regular rhythm.     Pulses: Normal pulses.     Heart sounds: Normal heart sounds.  Pulmonary:     Effort: Pulmonary effort is normal.     Breath sounds: Normal breath sounds.  Musculoskeletal:     Comments: Normal Muscle Bulk and Muscle Testing Reveals:  Upper Extremities: Full ROM and Muscle Strength 5/5 Bilateral AC Joint Tenderness  Thoracic and Lumbar Hypersensitivity Bilateral Greater Trochanter Tenderness  Lower Extremities: Full ROM and Muscle Strength 5/5 Bilateral Lower Extremity Flexion Produces Pain into Bilateral Patella's Arises from Table Slowly Narrow Based Gait   Skin:    General: Skin is warm and dry.  Neurological:     Mental Status: She is alert and oriented to person, place, and time.  Psychiatric:        Mood and Affect: Mood normal.        Behavior: Behavior normal.           Assessment & Plan:  1.Lumbar pain lumbar spondylosis: Encouraged to continue toincrease Activity as tolerated. 02/01/2019. Continue current medication regimen.Refilled: MS Contin 30 MG one tablet every 8 hours #90. We will continue the opioid monitoring program, this consists of regular clinic visits, examinations, urine drug screen, pill counts as well as use of [place] Controlled Substance Reporting System. 2. Lumbar Radiculitis: Continuecurrent medication regimen withGabapentin.02/01/2019 3. Bilateral Knee Pain/ Degenerative: Continue Voltaren Gel. Continue to Monitor.02/01/2019 4. Severe  diabetic poly neuropathy: Continuecurrent medication regimen withGabapentin.02/01/2019 5.  Insomnia: ContinueTrazodone PsychiatryFollowing.02/01/2019 6.Anxiety/Depression:Lorazepam,ProzacandHydroxyzinePrescribed byPsychiatry.02/01/2019 7. De Quervains Tenosynovitis: No Complaints Today.02/01/2019 8.BilateralGreater Trochanteric Bursitis: Continue with Ice and Heat Therapy. Continue to Monitor.02/01/2019. 9. Cervicalgia/ Cervical Radiculitis:Continue with HEP as Tolerated and continue to monitor.02/01/2019 10. Chronic Bilateral Thoracic Back Pain: Continue HEP as Tolerated. Continue current medication regimen. Continue to monitor.02/01/2019.  94minutes of face to face patient care time was spent during this visit. All questions were encouraged and answered.  F/U in 1 month

## 2019-02-03 ENCOUNTER — Encounter: Payer: Self-pay | Admitting: Registered Nurse

## 2019-02-14 DIAGNOSIS — R85612 Low grade squamous intraepithelial lesion on cytologic smear of anus (LGSIL): Secondary | ICD-10-CM | POA: Diagnosis not present

## 2019-02-14 DIAGNOSIS — D490 Neoplasm of unspecified behavior of digestive system: Secondary | ICD-10-CM | POA: Diagnosis not present

## 2019-03-01 ENCOUNTER — Other Ambulatory Visit: Payer: Self-pay | Admitting: "Endocrinology

## 2019-03-03 ENCOUNTER — Other Ambulatory Visit: Payer: Self-pay

## 2019-03-03 ENCOUNTER — Encounter: Payer: Self-pay | Admitting: Registered Nurse

## 2019-03-03 ENCOUNTER — Encounter: Payer: Medicare Other | Attending: Registered Nurse | Admitting: Registered Nurse

## 2019-03-03 VITALS — BP 127/68 | HR 61 | Temp 97.7°F | Ht 64.0 in | Wt 237.0 lb

## 2019-03-03 DIAGNOSIS — Z955 Presence of coronary angioplasty implant and graft: Secondary | ICD-10-CM | POA: Diagnosis not present

## 2019-03-03 DIAGNOSIS — F329 Major depressive disorder, single episode, unspecified: Secondary | ICD-10-CM | POA: Insufficient documentation

## 2019-03-03 DIAGNOSIS — G4733 Obstructive sleep apnea (adult) (pediatric): Secondary | ICD-10-CM | POA: Diagnosis not present

## 2019-03-03 DIAGNOSIS — I25118 Atherosclerotic heart disease of native coronary artery with other forms of angina pectoris: Secondary | ICD-10-CM | POA: Diagnosis not present

## 2019-03-03 DIAGNOSIS — E1142 Type 2 diabetes mellitus with diabetic polyneuropathy: Secondary | ICD-10-CM | POA: Diagnosis not present

## 2019-03-03 DIAGNOSIS — M48062 Spinal stenosis, lumbar region with neurogenic claudication: Secondary | ICD-10-CM

## 2019-03-03 DIAGNOSIS — M7061 Trochanteric bursitis, right hip: Secondary | ICD-10-CM | POA: Diagnosis not present

## 2019-03-03 DIAGNOSIS — M5416 Radiculopathy, lumbar region: Secondary | ICD-10-CM

## 2019-03-03 DIAGNOSIS — G8929 Other chronic pain: Secondary | ICD-10-CM | POA: Insufficient documentation

## 2019-03-03 DIAGNOSIS — I2510 Atherosclerotic heart disease of native coronary artery without angina pectoris: Secondary | ICD-10-CM | POA: Diagnosis not present

## 2019-03-03 DIAGNOSIS — M546 Pain in thoracic spine: Secondary | ICD-10-CM | POA: Diagnosis not present

## 2019-03-03 DIAGNOSIS — M545 Low back pain: Secondary | ICD-10-CM | POA: Diagnosis not present

## 2019-03-03 DIAGNOSIS — E785 Hyperlipidemia, unspecified: Secondary | ICD-10-CM | POA: Insufficient documentation

## 2019-03-03 DIAGNOSIS — M542 Cervicalgia: Secondary | ICD-10-CM | POA: Insufficient documentation

## 2019-03-03 DIAGNOSIS — Z823 Family history of stroke: Secondary | ICD-10-CM | POA: Diagnosis not present

## 2019-03-03 DIAGNOSIS — M1711 Unilateral primary osteoarthritis, right knee: Secondary | ICD-10-CM

## 2019-03-03 DIAGNOSIS — I110 Hypertensive heart disease with heart failure: Secondary | ICD-10-CM | POA: Insufficient documentation

## 2019-03-03 DIAGNOSIS — J449 Chronic obstructive pulmonary disease, unspecified: Secondary | ICD-10-CM | POA: Diagnosis not present

## 2019-03-03 DIAGNOSIS — M7062 Trochanteric bursitis, left hip: Secondary | ICD-10-CM | POA: Insufficient documentation

## 2019-03-03 DIAGNOSIS — M25561 Pain in right knee: Secondary | ICD-10-CM | POA: Insufficient documentation

## 2019-03-03 DIAGNOSIS — G4700 Insomnia, unspecified: Secondary | ICD-10-CM | POA: Diagnosis not present

## 2019-03-03 DIAGNOSIS — Z8249 Family history of ischemic heart disease and other diseases of the circulatory system: Secondary | ICD-10-CM | POA: Diagnosis not present

## 2019-03-03 DIAGNOSIS — Z79891 Long term (current) use of opiate analgesic: Secondary | ICD-10-CM

## 2019-03-03 DIAGNOSIS — Z5181 Encounter for therapeutic drug level monitoring: Secondary | ICD-10-CM

## 2019-03-03 DIAGNOSIS — I252 Old myocardial infarction: Secondary | ICD-10-CM | POA: Diagnosis not present

## 2019-03-03 DIAGNOSIS — F419 Anxiety disorder, unspecified: Secondary | ICD-10-CM | POA: Diagnosis not present

## 2019-03-03 DIAGNOSIS — I509 Heart failure, unspecified: Secondary | ICD-10-CM | POA: Insufficient documentation

## 2019-03-03 DIAGNOSIS — M25562 Pain in left knee: Secondary | ICD-10-CM | POA: Diagnosis not present

## 2019-03-03 DIAGNOSIS — G894 Chronic pain syndrome: Secondary | ICD-10-CM

## 2019-03-03 DIAGNOSIS — M4726 Other spondylosis with radiculopathy, lumbar region: Secondary | ICD-10-CM | POA: Diagnosis not present

## 2019-03-03 DIAGNOSIS — K219 Gastro-esophageal reflux disease without esophagitis: Secondary | ICD-10-CM | POA: Insufficient documentation

## 2019-03-03 DIAGNOSIS — M5412 Radiculopathy, cervical region: Secondary | ICD-10-CM

## 2019-03-03 DIAGNOSIS — M1712 Unilateral primary osteoarthritis, left knee: Secondary | ICD-10-CM

## 2019-03-03 MED ORDER — MORPHINE SULFATE ER 30 MG PO TBCR: 30.0000 mg | EXTENDED_RELEASE_TABLET | Freq: Three times a day (TID) | ORAL | 0 refills | Status: DC

## 2019-03-03 NOTE — Progress Notes (Signed)
Subjective:    Patient ID: [name], female    DOB: 1950-04-10, 68 y.o.   MRN: [id number]  HPI: [name] is a 68 y.o. female who returns for follow up appointment for chronic pain and medication refill. She states her  pain is located in her neck radiating into her right shoulder, mid- lower back, bilateral knees and bilateral feet pain with tingling and burning. She rates her pain 7. Her  current exercise regime is walking.  Ms. Thatch Morphine equivalent is 90.00 MME. She  is also prescribed  Lorazepam  by Noemi Chapel. We have discussed the black box warning of using opioids and benzodiazepines. I highlighted the dangers of using these drugs together and discussed the adverse events including respiratory suppression, overdose, cognitive impairment and importance of compliance with current regimen. We will continue to monitor and adjust as indicated.   She is being closely monitored and under the care of her psychiatrist Dr. Casimiro Needle.   Pain Inventory Average Pain 8 Pain Right Now 7 My pain is sharp, burning, stabbing, tingling and aching  In the last 24 hours, has pain interfered with the following? General activity 7 Relation with others 7 Enjoyment of life 7 What TIME of day is your pain at its worst? morning and night Sleep (in general) Fair  Pain is worse with: walking, bending, sitting, inactivity, standing and some activites Pain improves with: rest and medication Relief from Meds: 6  Mobility walk without assistance walk with assistance use a walker how many minutes can you walk? 5 ability to climb steps?  no do you drive?  yes use a wheelchair needs help with transfers  Function disabled: date disabled . I need assistance with the following:  dressing, bathing, meal prep, household duties and shopping  Neuro/Psych numbness tingling trouble walking spasms dizziness confusion depression anxiety  Prior Studies Any changes since last visit?   no  Physicians involved in your care Any changes since last visit?  no   Family History  Problem Relation Age of Onset  . Heart attack Mother   . Colon cancer Mother   . Heart attack Father   . Stroke Sister   . Hemophilia Child   . Cancer Other   . Coronary artery disease Other   . Cancer Brother   . Cancer Maternal Aunt   . Cancer Maternal Uncle   . Cancer Paternal Aunt   . Cancer Paternal Uncle   . Cancer Brother    Social History   Socioeconomic History  . Marital status: Legally Separated    Spouse name: Not on file  . Number of children: 4  . Years of education: Not on file  . Highest education level: Not on file  Occupational History  . Occupation: Disabled  Tobacco Use  . Smoking status: Never Smoker  . Smokeless tobacco: Never Used  Substance and Sexual Activity  . Alcohol use: No    Alcohol/week: 0.0 standard drinks  . Drug use: No  . Sexual activity: Not on file  Other Topics Concern  . Not on file  Social History Narrative   Patient does not get regular exercise   Denies caffeine use    Social Determinants of Health   Financial Resource Strain:   . Difficulty of Paying Living Expenses: Not on file  Food Insecurity:   . Worried About Charity fundraiser in the Last Year: Not on file  . Ran Out of Food in the Last Year: Not  on file  Transportation Needs:   . Lack of Transportation (Medical): Not on file  . Lack of Transportation (Non-Medical): Not on file  Physical Activity:   . Days of Exercise per Week: Not on file  . Minutes of Exercise per Session: Not on file  Stress:   . Feeling of Stress : Not on file  Social Connections:   . Frequency of Communication with Friends and Family: Not on file  . Frequency of Social Gatherings with Friends and Family: Not on file  . Attends Religious Services: Not on file  . Active Member of Clubs or Organizations: Not on file  . Attends Archivist Meetings: Not on file  . Marital Status: Not on  file   Past Surgical History:  Procedure Laterality Date  . ABDOMINAL HYSTERECTOMY    . Arm Surgery Bilateral    due to fall-pt broke both forearms  . BIOPSY N/A 07/01/2013   Procedure: BIOPSY;  Surgeon: Rogene Houston, MD;  [facility];  Service: Endoscopy;  Laterality: N/A;  . CHOLECYSTECTOMY    . COLONOSCOPY  08/06/2011   Procedure: COLONOSCOPY;  Surgeon: Rogene Houston, MD;  [facility];  Service: Endoscopy;  Laterality: N/A;  215  . COLONOSCOPY WITH PROPOFOL N/A 12/31/2012   Procedure: COLONOSCOPY WITH PROPOFOL;  Surgeon: Rogene Houston, MD;  [facility];  Service: Endoscopy;  Laterality: N/A;  in cecum at 0814, total withdrawal time 44mn  . COLONOSCOPY WITH PROPOFOL N/A 02/05/2018   Procedure: COLONOSCOPY WITH PROPOFOL;  Surgeon: RRogene Houston MD;  [facility];  Service: Endoscopy;  Laterality: N/A;  1:25  . CORONARY ANGIOPLASTY WITH STENT PLACEMENT    . ESOPHAGEAL DILATION N/A 06/07/2015   Procedure: ESOPHAGEAL DILATION;  Surgeon: NRogene Houston MD;  [facility];  Service: Endoscopy;  Laterality: N/A;  . ESOPHAGOGASTRODUODENOSCOPY N/A 06/07/2015   Procedure: ESOPHAGOGASTRODUODENOSCOPY (EGD);  Surgeon: NRogene Houston MD;  [facility];  Service: Endoscopy;  Laterality: N/A;  2:45 - moved to 1:00 - Ann to notify pt  . ESOPHAGOGASTRODUODENOSCOPY (EGD) WITH PROPOFOL N/A 12/31/2012   Procedure: ESOPHAGOGASTRODUODENOSCOPY (EGD) WITH PROPOFOL;  Surgeon: NRogene Houston MD;  [facility];  Service: Endoscopy;  Laterality: N/A;  GE junction 37,  . ESOPHAGOGASTRODUODENOSCOPY (EGD) WITH PROPOFOL N/A 07/01/2013   Procedure: ESOPHAGOGASTRODUODENOSCOPY (EGD) WITH PROPOFOL;  Surgeon: NRogene Houston MD;  [facility];  Service: Endoscopy;  Laterality: N/A;  950  . MALONEY DILATION N/A 12/31/2012   Procedure: MALONEY DILATION;  Surgeon: NRogene Houston MD;  [facility];  Service: Endoscopy;  Laterality: N/A;  used a #  54,#56  . MALONEY DILATION N/A 07/01/2013   Procedure: MVenia MinksDILATION;  Surgeon: NRogene Houston MD;  [facility];  Service: Endoscopy;  Laterality: N/A;  54/58; no heme present  . POLYPECTOMY  02/05/2018   Procedure: POLYPECTOMY;  Surgeon: RRogene Houston MD;  [facility];  Service: Endoscopy;;  distal rectum (HS)   Past Medical History:  Diagnosis Date  . Anxiety and depression   . Arthritis   . CAD (coronary artery disease)    Stent placement circumflex coronary 2007, catheterization 2008 patent stents. Normal LV function  . Chest pain   . [place]   . COPD (chronic obstructive pulmonary disease) ([facility])    no home O2  . Depression   . Diabetes mellitus    Insulin dependent  . Diabetic  polyneuropathy ([place])     severe on multiple medications  . Dyslipidemia   . GERD (gastroesophageal reflux disease)   . Headache(784.0)   . 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 Hypertension   . MI (myocardial infarction) ([place])    2007  . Obstructive sleep apnea    CPAP, setting ?2  . Palpitations   . Tachycardia    Temp 97.7 F (36.5 C)   Ht 5' 4"  (1.626 m)   Wt 237 lb (107.5 kg)   BMI 40.68 kg/m   Opioid Risk Score:   Fall Risk Score:  `1  Depression screen PHQ 2/9  Depression screen [facility] 2/9 07/16/2018 06/12/2017 04/14/2017 02/17/2017 01/14/2017 12/24/2016 11/11/2016  Decreased Interest 1 3 3  0 3 0 0  Down, Depressed, Hopeless 1 3 3  0 3 0 0  PHQ - 2 Score 2 6 6  0 6 0 0  Altered sleeping - 3 - - - - -  Tired, decreased energy - 3 - - - - -  Change in appetite - 0 - - - - -  Feeling bad or failure about yourself  - 3 - - - - -  Trouble concentrating - 3 - - - - -  Moving slowly or fidgety/restless - 1 - - - - -  Suicidal thoughts - 0 - - - - -  PHQ-9 Score - 19 - - - - -  Difficult doing work/chores - Very difficult - - - - -  Some recent data might be hidden    Review of Systems     Objective:   Physical Exam Vitals and nursing note  reviewed.  Constitutional:      Appearance: Normal appearance.  Cardiovascular:     Rate and Rhythm: Normal rate and regular rhythm.     Pulses: Normal pulses.     Heart sounds: Normal heart sounds.  Pulmonary:     Effort: Pulmonary effort is normal.     Breath sounds: Normal breath sounds.  Musculoskeletal:     Cervical back: Normal range of motion and neck supple.     Comments: Normal Muscle Bulk and Muscle Testing Reveals:  Upper Extremities: Full ROM and Muscle Strength 5/5 Thoracic and Lumbar Hypersensitivity Lower Extremities: Full ROM and Muscle Strength 5/5 Arises from Table with ease Narrow Based  Gait   Skin:    General: Skin is warm and dry.  Neurological:     Mental Status: She is alert and oriented to person, place, and time.  Psychiatric:        Mood and Affect: Mood normal.        Behavior: Behavior normal.           Assessment & Plan:  1.Lumbar pain lumbar spondylosis: Encouraged to continue toincrease Activity as tolerated.03/03/2019. Continue current medication regimen.Refilled: MS Contin 30 MG one tablet every 8 hours #90. We will continue the opioid monitoring program, this consists of regular clinic visits, examinations, urine drug screen, pill counts as well as use of [place] Controlled Substance Reporting System. 2. Lumbar Radiculitis: Continuecurrent medication regimen withGabapentin.03/03/2019 3. Bilateral Knee Pain/ Degenerative: Continue Voltaren Gel. Continue to Monitor.03/03/2019 4. Severe diabetic poly neuropathy: Continuecurrent medication regimen withGabapentin.03/03/2019 5. Insomnia: ContinueTrazodone PsychiatryFollowing.03/03/2019 6.Anxiety/Depression:Lorazepam,ProzacandHydroxyzinePrescribed byPsychiatry.03/03/2019 7. De Quervains Tenosynovitis: No Complaints Today.03/03/2019 8.BilateralGreater Trochanteric Bursitis:No complaints today. Continue with Ice and Heat Therapy. Continue to Monitor.03/03/2019. 9.  Cervicalgia/ Cervical Radiculitis:Continue Gabapentin. Continue with HEP as Tolerated and continue to monitor.03/03/2019 10. Chronic Bilateral Thoracic Back Pain: Continue HEP as Tolerated.  Continue current medication regimen. Continue to monitor.03/03/2019.  68mnutes of face to face patient care time was spent during this visit. All questions were encouraged and answered.  F/U in 1 month

## 2019-03-24 ENCOUNTER — Encounter: Payer: Medicare Other | Attending: Registered Nurse | Admitting: Registered Nurse

## 2019-03-24 ENCOUNTER — Encounter: Payer: Self-pay | Admitting: Registered Nurse

## 2019-03-24 ENCOUNTER — Other Ambulatory Visit: Payer: Self-pay

## 2019-03-24 VITALS — BP 127/68 | HR 61 | Temp 97.7°F | Ht 64.0 in | Wt 237.0 lb

## 2019-03-24 DIAGNOSIS — G894 Chronic pain syndrome: Secondary | ICD-10-CM

## 2019-03-24 DIAGNOSIS — I509 Heart failure, unspecified: Secondary | ICD-10-CM | POA: Insufficient documentation

## 2019-03-24 DIAGNOSIS — M1712 Unilateral primary osteoarthritis, left knee: Secondary | ICD-10-CM

## 2019-03-24 DIAGNOSIS — M5412 Radiculopathy, cervical region: Secondary | ICD-10-CM

## 2019-03-24 DIAGNOSIS — M48062 Spinal stenosis, lumbar region with neurogenic claudication: Secondary | ICD-10-CM | POA: Diagnosis not present

## 2019-03-24 DIAGNOSIS — K219 Gastro-esophageal reflux disease without esophagitis: Secondary | ICD-10-CM | POA: Insufficient documentation

## 2019-03-24 DIAGNOSIS — G4733 Obstructive sleep apnea (adult) (pediatric): Secondary | ICD-10-CM | POA: Insufficient documentation

## 2019-03-24 DIAGNOSIS — M7062 Trochanteric bursitis, left hip: Secondary | ICD-10-CM | POA: Insufficient documentation

## 2019-03-24 DIAGNOSIS — Z955 Presence of coronary angioplasty implant and graft: Secondary | ICD-10-CM | POA: Insufficient documentation

## 2019-03-24 DIAGNOSIS — M542 Cervicalgia: Secondary | ICD-10-CM | POA: Diagnosis not present

## 2019-03-24 DIAGNOSIS — I2510 Atherosclerotic heart disease of native coronary artery without angina pectoris: Secondary | ICD-10-CM | POA: Insufficient documentation

## 2019-03-24 DIAGNOSIS — Z823 Family history of stroke: Secondary | ICD-10-CM | POA: Insufficient documentation

## 2019-03-24 DIAGNOSIS — M546 Pain in thoracic spine: Secondary | ICD-10-CM | POA: Diagnosis not present

## 2019-03-24 DIAGNOSIS — Z8249 Family history of ischemic heart disease and other diseases of the circulatory system: Secondary | ICD-10-CM | POA: Insufficient documentation

## 2019-03-24 DIAGNOSIS — G4700 Insomnia, unspecified: Secondary | ICD-10-CM | POA: Insufficient documentation

## 2019-03-24 DIAGNOSIS — F419 Anxiety disorder, unspecified: Secondary | ICD-10-CM | POA: Insufficient documentation

## 2019-03-24 DIAGNOSIS — M5416 Radiculopathy, lumbar region: Secondary | ICD-10-CM

## 2019-03-24 DIAGNOSIS — M545 Low back pain: Secondary | ICD-10-CM | POA: Insufficient documentation

## 2019-03-24 DIAGNOSIS — I110 Hypertensive heart disease with heart failure: Secondary | ICD-10-CM | POA: Insufficient documentation

## 2019-03-24 DIAGNOSIS — I252 Old myocardial infarction: Secondary | ICD-10-CM | POA: Insufficient documentation

## 2019-03-24 DIAGNOSIS — M4726 Other spondylosis with radiculopathy, lumbar region: Secondary | ICD-10-CM | POA: Insufficient documentation

## 2019-03-24 DIAGNOSIS — Z79891 Long term (current) use of opiate analgesic: Secondary | ICD-10-CM

## 2019-03-24 DIAGNOSIS — M1711 Unilateral primary osteoarthritis, right knee: Secondary | ICD-10-CM

## 2019-03-24 DIAGNOSIS — M25562 Pain in left knee: Secondary | ICD-10-CM | POA: Insufficient documentation

## 2019-03-24 DIAGNOSIS — G8929 Other chronic pain: Secondary | ICD-10-CM | POA: Insufficient documentation

## 2019-03-24 DIAGNOSIS — M25561 Pain in right knee: Secondary | ICD-10-CM | POA: Insufficient documentation

## 2019-03-24 DIAGNOSIS — E785 Hyperlipidemia, unspecified: Secondary | ICD-10-CM | POA: Insufficient documentation

## 2019-03-24 DIAGNOSIS — M7061 Trochanteric bursitis, right hip: Secondary | ICD-10-CM | POA: Insufficient documentation

## 2019-03-24 DIAGNOSIS — F329 Major depressive disorder, single episode, unspecified: Secondary | ICD-10-CM | POA: Insufficient documentation

## 2019-03-24 DIAGNOSIS — Z5181 Encounter for therapeutic drug level monitoring: Secondary | ICD-10-CM

## 2019-03-24 DIAGNOSIS — E1142 Type 2 diabetes mellitus with diabetic polyneuropathy: Secondary | ICD-10-CM | POA: Insufficient documentation

## 2019-03-24 DIAGNOSIS — J449 Chronic obstructive pulmonary disease, unspecified: Secondary | ICD-10-CM | POA: Insufficient documentation

## 2019-03-24 DIAGNOSIS — Z7982 Long term (current) use of aspirin: Secondary | ICD-10-CM

## 2019-03-24 MED ORDER — MORPHINE SULFATE ER 30 MG PO TBCR: 30.0000 mg | EXTENDED_RELEASE_TABLET | Freq: Three times a day (TID) | ORAL | 0 refills | Status: DC

## 2019-03-24 NOTE — Progress Notes (Signed)
Subjective:    Patient ID: [name], female    DOB: [birth year], 69 y.o.   MRN: [id number]  HPI: [name] is a 69 y.o. female whose appointment was changed to a virtual office visit to reduce the risk of exposure to the COVID-19 virus and to help [name] remain healthy and safe. The virtual visit will also provide continuity of care. [name] agrees to tele-health visit and verbalizes understanding. She states her pain is located in  In her neck radiating into her bilateral shoulders, mid-lower back radiating into her bilateral lower extremities and bilateral feet. She rates her pain 5. Her current exercise regime is walking.  [name] Morphine equivalent is 90.00 MME.  Last UDS was Performed on 01/05/2019, it was consistent.   She is being closely monitored and under the care of her psychiatrist Dr. Casimiro Needle.   Wenda Overland RN asked the Health and History Questions. This provider and Wenda Overland RN verfied we were speaking with the correct person using two identifiers.    Pain Inventory Average Pain 5 Pain Right Now 5 My pain is constant, sharp, burning and stabbing  In the last 24 hours, has pain interfered with the following? General activity 7 Relation with others 7 Enjoyment of life 7 What TIME of day is your pain at its worst? morning and night Sleep (in general) Good  Pain is worse with: walking, standing and some activites Pain improves with: rest and medication Relief from Meds: 8  Mobility walk with assistance use a cane use a walker ability to climb steps?  yes do you drive?  yes  Function disabled: date disabled . I need assistance with the following:  meal prep, household duties and shopping  Neuro/Psych numbness tingling trouble walking spasms dizziness depression anxiety  Prior Studies Any changes since last visit?  no  Physicians involved in your care Any changes since last visit?  no   Family History  Problem Relation  Age of Onset  . Heart attack Mother   . Colon cancer Mother   . Heart attack Father   . Stroke Sister   . Hemophilia Child   . Cancer Other   . Coronary artery disease Other   . Cancer Brother   . Cancer Maternal Aunt   . Cancer Maternal Uncle   . Cancer Paternal Aunt   . Cancer Paternal Uncle   . Cancer Brother    Social History   Socioeconomic History  . Marital status: Legally Separated    Spouse name: Not on file  . Number of children: 4  . Years of education: Not on file  . Highest education level: Not on file  Occupational History  . Occupation: Disabled  Tobacco Use  . Smoking status: Never Smoker  . Smokeless tobacco: Never Used  Substance and Sexual Activity  . Alcohol use: No    Alcohol/week: 0.0 standard drinks  . Drug use: No  . Sexual activity: Not on file  Other Topics Concern  . Not on file  Social History Narrative   Patient does not get regular exercise   Denies caffeine use    Social Determinants of Health   Financial Resource Strain:   . Difficulty of Paying Living Expenses: Not on file  Food Insecurity:   . Worried About Charity fundraiser in the Last Year: Not on file  . Ran Out of Food in the Last Year: Not on file  Transportation Needs:   . Lack  of Transportation (Medical): Not on file  . Lack of Transportation (Non-Medical): Not on file  Physical Activity:   . Days of Exercise per Week: Not on file  . Minutes of Exercise per Session: Not on file  Stress:   . Feeling of Stress : Not on file  Social Connections:   . Frequency of Communication with Friends and Family: Not on file  . Frequency of Social Gatherings with Friends and Family: Not on file  . Attends Religious Services: Not on file  . Active Member of Clubs or Organizations: Not on file  . Attends Archivist Meetings: Not on file  . Marital Status: Not on file   Past Surgical History:  Procedure Laterality Date  . ABDOMINAL HYSTERECTOMY    . Arm Surgery  Bilateral    due to fall-pt broke both forearms  . BIOPSY N/A 07/01/2013   Procedure: BIOPSY;  Surgeon: Rogene Houston, MD;  [facility];  Service: Endoscopy;  Laterality: N/A;  . CHOLECYSTECTOMY    . COLONOSCOPY  08/06/2011   Procedure: COLONOSCOPY;  Surgeon: Rogene Houston, MD;  [facility];  Service: Endoscopy;  Laterality: N/A;  215  . COLONOSCOPY WITH PROPOFOL N/A 12/31/2012   Procedure: COLONOSCOPY WITH PROPOFOL;  Surgeon: Rogene Houston, MD;  Location: [facility];  Service: Endoscopy;  Laterality: N/A;  in cecum at 0814, total withdrawal time 11mn  . COLONOSCOPY WITH PROPOFOL N/A 02/05/2018   Procedure: COLONOSCOPY WITH PROPOFOL;  Surgeon: RRogene Houston MD;  [facility];  Service: Endoscopy;  Laterality: N/A;  1:25  . CORONARY ANGIOPLASTY WITH STENT PLACEMENT    . ESOPHAGEAL DILATION N/A 06/07/2015   Procedure: ESOPHAGEAL DILATION;  Surgeon: NRogene Houston MD;  [facility];  Service: Endoscopy;  Laterality: N/A;  . ESOPHAGOGASTRODUODENOSCOPY N/A 06/07/2015   Procedure: ESOPHAGOGASTRODUODENOSCOPY (EGD);  Surgeon: NRogene Houston MD;  [facility];  Service: Endoscopy;  Laterality: N/A;  2:45 - moved to 1:00 - Ann to notify pt  . ESOPHAGOGASTRODUODENOSCOPY (EGD) WITH PROPOFOL N/A 12/31/2012   Procedure: ESOPHAGOGASTRODUODENOSCOPY (EGD) WITH PROPOFOL;  Surgeon: NRogene Houston MD;  [facility];  Service: Endoscopy;  Laterality: N/A;  GE junction 37,  . ESOPHAGOGASTRODUODENOSCOPY (EGD) WITH PROPOFOL N/A 07/01/2013   Procedure: ESOPHAGOGASTRODUODENOSCOPY (EGD) WITH PROPOFOL;  Surgeon: NRogene Houston MD;  [facility];  Service: Endoscopy;  Laterality: N/A;  950  . MALONEY DILATION N/A 12/31/2012   Procedure: MALONEY DILATION;  Surgeon: NRogene Houston MD;  [facility];  Service: Endoscopy;  Laterality: N/A;  used a # 54,#56  . MALONEY DILATION N/A 07/01/2013   Procedure: MVenia MinksDILATION;  Surgeon: NRogene Houston MD;   [facility];  Service: Endoscopy;  Laterality: N/A;  54/58; no heme present  . POLYPECTOMY  02/05/2018   Procedure: POLYPECTOMY;  Surgeon: RRogene Houston MD;  [facility];  Service: Endoscopy;;  distal rectum (HS)   Past Medical History:  Diagnosis Date  . Anxiety and depression   . Arthritis   . CAD (coronary artery disease)    Stent placement circumflex coronary 2007, catheterization 2008 patent stents. Normal LV function  . Chest pain   . [place]) [place]   . COPD (chronic obstructive pulmonary disease) (H[facility])    no home O2  . Depression   . Diabetes mellitus    Insulin dependent  . Diabetic polyneuropathy (HCC)     severe on multiple  medications  . Dyslipidemia   . GERD (gastroesophageal reflux disease)   . Headache(784.0)   . 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 Hypertension   . MI (myocardial infarction) ([place])    2007  . Obstructive sleep apnea    CPAP, setting ?2  . Palpitations   . Tachycardia    BP 127/68 Comment: phone visit- last recorded  Pulse 61 Comment: phone visit- last recorded  Temp 97.7 F (36.5 C) Comment: phone visit- last recorded  Ht 5' 4"  (1.626 m) Comment: phone visit- last recorded  Wt 237 lb (107.5 kg) Comment: phone visit- last recorded  BMI 40.68 kg/m   Opioid Risk Score:   Fall Risk Score:  `1  Depression screen PHQ 2/9  Depression screen [facility] 2/9 03/24/2019 07/16/2018 06/12/2017 04/14/2017 02/17/2017 01/14/2017 12/24/2016  Decreased Interest 1 1 3 3  0 3 0  Down, Depressed, Hopeless 1 1 3 3  0 3 0  PHQ - 2 Score 2 2 6 6  0 6 0  Altered sleeping - - 3 - - - -  Tired, decreased energy - - 3 - - - -  Change in appetite - - 0 - - - -  Feeling bad or failure about yourself  - - 3 - - - -  Trouble concentrating - - 3 - - - -  Moving slowly or fidgety/restless - - 1 - - - -  Suicidal thoughts - - 0 - - - -  PHQ-9 Score - - 19 - - - -  Difficult doing work/chores - - Very difficult - - - -  Some recent data might be  hidden   Review of Systems  Constitutional: Negative.   HENT: Negative.   Eyes: Negative.   Respiratory: Negative.   Cardiovascular: Negative.   Gastrointestinal: Negative.   Endocrine: Negative.   Genitourinary: Negative.   Musculoskeletal: Positive for back pain and gait problem.       Spasm  Skin: Negative.   Allergic/Immunologic: Negative.   Neurological: Positive for dizziness and numbness.       Tingling  Hematological: Negative.   Psychiatric/Behavioral: Positive for confusion and dysphoric mood. The patient is nervous/anxious.   All other systems reviewed and are negative.      Objective:   Physical Exam Vitals and nursing note reviewed.  Musculoskeletal:     Comments: No Physical Exam Performed: Virtual Visit           Assessment & Plan:  1.Lumbar pain lumbar spondylosis: Encouraged to continue toincrease Activity as tolerated.03/24/2019. Continue current medication regimen.Refilled: MS Contin 30 MG one tablet every 8 hours #90. We will continue the opioid monitoring program, this consists of regular clinic visits, examinations, urine drug screen, pill counts as well as use of [place] Controlled Substance Reporting System. 2. Lumbar Radiculitis: Continuecurrent medication regimen withGabapentin.03/24/2019 3. Bilateral Knee Pain/ Degenerative: Continue Voltaren Gel. Continue to Monitor.03/24/2019 4. Severe diabetic poly neuropathy: Continuecurrent medication regimen withGabapentin.03/24/2019 5. Insomnia: ContinueTrazodone PsychiatryFollowing.03/24/2019 6.Anxiety/Depression:Lorazepam,ProzacandHydroxyzinePrescribed byPsychiatry.03/24/2019 7. De Quervains Tenosynovitis: No Complaints Today.03/24/2019 8.BilateralGreater Trochanteric Bursitis:No complaints today. Continue with Ice and Heat Therapy. Continue to Monitor.03/24/2019. 9. Cervicalgia/ Cervical Radiculitis:Continue Gabapentin. Continue with HEP as Tolerated and continue to  monitor.03/24/2019 10. Chronic Bilateral Thoracic Back Pain: Continue HEP as Tolerated. Continue current medication regimen. Continue to monitor.03/24/2019.  F/U in 1 month  Tele-Health Visit Telephone Call Established Patient Location of Patient: In Her Home Location of Provider: In Office Time Spent:

## 2019-03-27 ENCOUNTER — Other Ambulatory Visit: Payer: Self-pay | Admitting: "Endocrinology

## 2019-03-27 DIAGNOSIS — E1159 Type 2 diabetes mellitus with other circulatory complications: Secondary | ICD-10-CM

## 2019-03-28 ENCOUNTER — Other Ambulatory Visit: Payer: Self-pay

## 2019-03-28 ENCOUNTER — Ambulatory Visit: Payer: Medicare Other | Attending: Internal Medicine

## 2019-03-28 DIAGNOSIS — Z20822 Contact with and (suspected) exposure to covid-19: Secondary | ICD-10-CM

## 2019-03-29 LAB — NOVEL CORONAVIRUS, NAA: SARS-CoV-2, NAA: DETECTED — AB

## 2019-03-30 ENCOUNTER — Telehealth: Payer: Self-pay | Admitting: Nurse Practitioner

## 2019-03-30 ENCOUNTER — Ambulatory Visit: Payer: Medicare Other | Admitting: "Endocrinology

## 2019-03-30 NOTE — Telephone Encounter (Signed)
Called to Discuss with patient about Covid symptoms and the use of bamlanivimab, a monoclonal antibody infusion for those with mild to moderate Covid symptoms and at a high risk of hospitalization.     Patient is currently admitted to the hospital

## 2019-04-03 ENCOUNTER — Other Ambulatory Visit (INDEPENDENT_AMBULATORY_CARE_PROVIDER_SITE_OTHER): Payer: Self-pay | Admitting: Nurse Practitioner

## 2019-04-03 ENCOUNTER — Other Ambulatory Visit: Payer: Self-pay | Admitting: Physical Medicine & Rehabilitation

## 2019-04-03 NOTE — Telephone Encounter (Signed)
Janice pls manage refill request

## 2019-04-04 ENCOUNTER — Other Ambulatory Visit: Payer: Self-pay | Admitting: "Endocrinology

## 2019-04-04 NOTE — Telephone Encounter (Signed)
Last seen 2019 for EGD - on PPI twice a day. Would like to see her for yearly f/up to determine if still needs high dose. Refill sent to pharmacy until appt.   Currently admitted for covid per notes - we can check w/ her next week abt appt. Okay for telephone only if she would like.

## 2019-04-26 ENCOUNTER — Telehealth: Payer: Self-pay

## 2019-04-26 NOTE — Telephone Encounter (Signed)
Requesting [name] call him about patient.

## 2019-04-26 NOTE — Telephone Encounter (Signed)
Return Mr. Hassell Done call, he reports Ms Fair is in [facility] ICU on the ventilator, she was diagnosed with COVID. Also stated she was hospitalized at [facility] and transferred to [facility]. Ms. Flis appointment was cancelled for tomorrow and he was instructed to keep up updated on her care, he verbalizes understanding.

## 2019-04-27 ENCOUNTER — Encounter: Payer: Medicare Other | Admitting: Registered Nurse

## 2019-06-29 ENCOUNTER — Other Ambulatory Visit (INDEPENDENT_AMBULATORY_CARE_PROVIDER_SITE_OTHER): Payer: Self-pay | Admitting: Gastroenterology

## 2019-06-29 NOTE — Telephone Encounter (Signed)
Per Dr.Rehman the patient will need to have appointment with the office within 3 months prior to further refills.

## 2019-07-01 ENCOUNTER — Encounter: Payer: Medicare Other | Attending: Registered Nurse | Admitting: Registered Nurse

## 2019-07-01 ENCOUNTER — Other Ambulatory Visit: Payer: Self-pay

## 2019-07-01 VITALS — BP 149/75 | HR 73 | Temp 97.5°F | Ht 64.0 in | Wt 212.2 lb

## 2019-07-01 DIAGNOSIS — M48062 Spinal stenosis, lumbar region with neurogenic claudication: Secondary | ICD-10-CM

## 2019-07-01 DIAGNOSIS — M1712 Unilateral primary osteoarthritis, left knee: Secondary | ICD-10-CM

## 2019-07-01 DIAGNOSIS — G4733 Obstructive sleep apnea (adult) (pediatric): Secondary | ICD-10-CM | POA: Insufficient documentation

## 2019-07-01 DIAGNOSIS — Z955 Presence of coronary angioplasty implant and graft: Secondary | ICD-10-CM | POA: Diagnosis not present

## 2019-07-01 DIAGNOSIS — M25561 Pain in right knee: Secondary | ICD-10-CM | POA: Insufficient documentation

## 2019-07-01 DIAGNOSIS — I509 Heart failure, unspecified: Secondary | ICD-10-CM | POA: Insufficient documentation

## 2019-07-01 DIAGNOSIS — E1142 Type 2 diabetes mellitus with diabetic polyneuropathy: Secondary | ICD-10-CM | POA: Diagnosis not present

## 2019-07-01 DIAGNOSIS — G8929 Other chronic pain: Secondary | ICD-10-CM | POA: Diagnosis not present

## 2019-07-01 DIAGNOSIS — Z5181 Encounter for therapeutic drug level monitoring: Secondary | ICD-10-CM

## 2019-07-01 DIAGNOSIS — M542 Cervicalgia: Secondary | ICD-10-CM

## 2019-07-01 DIAGNOSIS — J449 Chronic obstructive pulmonary disease, unspecified: Secondary | ICD-10-CM | POA: Insufficient documentation

## 2019-07-01 DIAGNOSIS — M5416 Radiculopathy, lumbar region: Secondary | ICD-10-CM

## 2019-07-01 DIAGNOSIS — G894 Chronic pain syndrome: Secondary | ICD-10-CM

## 2019-07-01 DIAGNOSIS — Z8249 Family history of ischemic heart disease and other diseases of the circulatory system: Secondary | ICD-10-CM | POA: Insufficient documentation

## 2019-07-01 DIAGNOSIS — Z823 Family history of stroke: Secondary | ICD-10-CM | POA: Insufficient documentation

## 2019-07-01 DIAGNOSIS — K219 Gastro-esophageal reflux disease without esophagitis: Secondary | ICD-10-CM | POA: Insufficient documentation

## 2019-07-01 DIAGNOSIS — M7061 Trochanteric bursitis, right hip: Secondary | ICD-10-CM | POA: Insufficient documentation

## 2019-07-01 DIAGNOSIS — M545 Low back pain: Secondary | ICD-10-CM | POA: Insufficient documentation

## 2019-07-01 DIAGNOSIS — F329 Major depressive disorder, single episode, unspecified: Secondary | ICD-10-CM | POA: Insufficient documentation

## 2019-07-01 DIAGNOSIS — M1711 Unilateral primary osteoarthritis, right knee: Secondary | ICD-10-CM

## 2019-07-01 DIAGNOSIS — E785 Hyperlipidemia, unspecified: Secondary | ICD-10-CM | POA: Insufficient documentation

## 2019-07-01 DIAGNOSIS — M546 Pain in thoracic spine: Secondary | ICD-10-CM

## 2019-07-01 DIAGNOSIS — M25562 Pain in left knee: Secondary | ICD-10-CM | POA: Diagnosis not present

## 2019-07-01 DIAGNOSIS — M4726 Other spondylosis with radiculopathy, lumbar region: Secondary | ICD-10-CM | POA: Insufficient documentation

## 2019-07-01 DIAGNOSIS — I2510 Atherosclerotic heart disease of native coronary artery without angina pectoris: Secondary | ICD-10-CM | POA: Diagnosis not present

## 2019-07-01 DIAGNOSIS — I110 Hypertensive heart disease with heart failure: Secondary | ICD-10-CM | POA: Insufficient documentation

## 2019-07-01 DIAGNOSIS — Z79891 Long term (current) use of opiate analgesic: Secondary | ICD-10-CM

## 2019-07-01 DIAGNOSIS — M5412 Radiculopathy, cervical region: Secondary | ICD-10-CM

## 2019-07-01 DIAGNOSIS — M7062 Trochanteric bursitis, left hip: Secondary | ICD-10-CM

## 2019-07-01 DIAGNOSIS — I252 Old myocardial infarction: Secondary | ICD-10-CM | POA: Diagnosis not present

## 2019-07-01 DIAGNOSIS — G4700 Insomnia, unspecified: Secondary | ICD-10-CM | POA: Insufficient documentation

## 2019-07-01 DIAGNOSIS — F419 Anxiety disorder, unspecified: Secondary | ICD-10-CM | POA: Insufficient documentation

## 2019-07-01 MED ORDER — MORPHINE SULFATE ER 30 MG PO TBCR: 30.0000 mg | EXTENDED_RELEASE_TABLET | Freq: Two times a day (BID) | ORAL | 0 refills | Status: DC

## 2019-07-01 NOTE — Progress Notes (Signed)
Subjective:    Patient ID: [name], female    DOB: 1950-06-19, 69 y.o.   MRN: [id number]  HPI: [name] is a 69 y.o. female who returns for follow up appointment for chronic pain and medication refill. She states her pain is located in her neck radiating into her bilateral shoulders, upper- lower back radiating into her bilateral lower extremities, bilateral hip pain and bilateral feet. She rates her pain 5. Her current exercise regime is walking and performing stretching exercises.  [name] was [facility] to [facility]OVID [facility] for Acute hypoxemic respiratory failure due to severe acute respiratory syndrome coronavirus 2 (SARS-CoV-2) disease .  On 05/04/2019 she was trach and on trach collar and had a prolong hospital stay at [facility], see discharge summary for more details. She was transferred to [facility] on 05/11/2019 and discharged to [place] on 05/19/2019 and discharged home on 06/18/2019.   Ms. Tony Morphine equivalent is 60.00 MME. She was discharged home on Morphine Sulfate ER 30 mg twice a day. She  is also prescribed  Lorazepam  by Noemi Chapel. We have discussed the black box warning of using opioids and benzodiazepines. I highlighted the dangers of using these drugs together and discussed the adverse events including respiratory suppression, overdose, cognitive impairment and importance of compliance with current regimen. We will continue to monitor and adjust as indicated.   She is being closely monitored and under the care of her psychiatrist.   Pain Inventory Average Pain 7 Pain Right Now 5 My pain is sharp and burning  In the last 24 hours, has pain interfered with the following? General activity 7 Relation with others 2 Enjoyment of life 7 What TIME of day is your pain at its worst? night Sleep (in general) Good  Pain is worse with: walking,  bending and standing Pain improves with: rest and medication Relief from Meds: 1  Mobility use a walker how many minutes can you walk? 30 ability to climb steps?  no do you drive?  no use a wheelchair needs help with transfers  Function disabled: date disabled . I need assistance with the following:  dressing, bathing, toileting, meal prep, household duties and shopping  Neuro/Psych bladder control problems weakness  Prior Studies Any changes since last visit?  no  Physicians involved in your care Any changes since last visit?  no   Family History  Problem Relation Age of Onset  . Heart attack Mother   . Colon cancer Mother   . Heart attack Father   . Stroke Sister   . Hemophilia Child   . Cancer Other   . Coronary artery disease Other   . Cancer Brother   . Cancer Maternal Aunt   . Cancer Maternal Uncle   . Cancer Paternal Aunt   . Cancer Paternal Uncle   . Cancer Brother    Social History   Socioeconomic History  . Marital status: Legally Separated    Spouse name: Not on file  . Number of children: 4  . Years of education: Not on file  . Highest education level: Not on file  Occupational History  . Occupation: Disabled  Tobacco Use  . Smoking status: Never Smoker  . Smokeless tobacco: Never Used  Substance and Sexual Activity  . Alcohol use: No    Alcohol/week: 0.0 standard drinks  . Drug use: No  . Sexual activity:  Not on file  Other Topics Concern  . Not on file  Social History Narrative   Patient does not get regular exercise   Denies caffeine use    Social Determinants of Health   Financial Resource Strain:   . Difficulty of Paying Living Expenses:   Food Insecurity:   . Worried About Charity fundraiser in the Last Year:   . Arboriculturist in the Last Year:   Transportation Needs:   . Film/video editor (Medical):   Marland Kitchen Lack of Transportation (Non-Medical):   Physical Activity:   . Days of Exercise per Week:   . Minutes of  Exercise per Session:   Stress:   . Feeling of Stress :   Social Connections:   . Frequency of Communication with Friends and Family:   . Frequency of Social Gatherings with Friends and Family:   . Attends Religious Services:   . Active Member of Clubs or Organizations:   . Attends Archivist Meetings:   Marland Kitchen Marital Status:    Past Surgical History:  Procedure Laterality Date  . ABDOMINAL HYSTERECTOMY    . Arm Surgery Bilateral    due to fall-pt broke both forearms  . BIOPSY N/A 07/01/2013   Procedure: BIOPSY;  Surgeon: Rogene Houston, MD;  [facility];  Service: Endoscopy;  Laterality: N/A;  . CHOLECYSTECTOMY    . COLONOSCOPY  08/06/2011   Procedure: COLONOSCOPY;  Surgeon: Rogene Houston, MD;  [facility];  Service: Endoscopy;  Laterality: N/A;  215  . COLONOSCOPY WITH PROPOFOL N/A 12/31/2012   Procedure: COLONOSCOPY WITH PROPOFOL;  Surgeon: Rogene Houston, MD;  Location: [facility];  Service: Endoscopy;  Laterality: N/A;  in cecum at 0814, total withdrawal time 50mn  . COLONOSCOPY WITH PROPOFOL N/A 02/05/2018   Procedure: COLONOSCOPY WITH PROPOFOL;  Surgeon: RRogene Houston MD;  [facility];  Service: Endoscopy;  Laterality: N/A;  1:25  . CORONARY ANGIOPLASTY WITH STENT PLACEMENT    . ESOPHAGEAL DILATION N/A 06/07/2015   Procedure: ESOPHAGEAL DILATION;  Surgeon: NRogene Houston MD;  [facility];  Service: Endoscopy;  Laterality: N/A;  . ESOPHAGOGASTRODUODENOSCOPY N/A 06/07/2015   Procedure: ESOPHAGOGASTRODUODENOSCOPY (EGD);  Surgeon: NRogene Houston MD;  [facility];  Service: Endoscopy;  Laterality: N/A;  2:45 - moved to 1:00 - Ann to notify pt  . ESOPHAGOGASTRODUODENOSCOPY (EGD) WITH PROPOFOL N/A 12/31/2012   Procedure: ESOPHAGOGASTRODUODENOSCOPY (EGD) WITH PROPOFOL;  Surgeon: NRogene Houston MD;  [facility];  Service: Endoscopy;  Laterality: N/A;  GE junction 37,  . ESOPHAGOGASTRODUODENOSCOPY (EGD) WITH PROPOFOL  N/A 07/01/2013   Procedure: ESOPHAGOGASTRODUODENOSCOPY (EGD) WITH PROPOFOL;  Surgeon: NRogene Houston MD;  [facility]: [facility];  Service: Endoscopy;  Laterality: N/A;  950  . MALONEY DILATION N/A 12/31/2012   Procedure: MALONEY DILATION;  Surgeon: NRogene Houston MD;  [facility]: [facility];  Service: Endoscopy;  Laterality: N/A;  used a # 54,#56  . MALONEY DILATION N/A 07/01/2013   Procedure: MVenia MinksDILATION;  Surgeon: NRogene Houston MD;  [facility]: [facility];  Service: Endoscopy;  Laterality: N/A;  54/58; no heme present  . POLYPECTOMY  02/05/2018   Procedure: POLYPECTOMY;  Surgeon: RRogene Houston MD;  [facility];  Service: Endoscopy;;  distal rectum (HS)   Past Medical History:  Diagnosis Date  . Anxiety and depression   . Arthritis   . CAD (coronary artery disease)    Stent  placement circumflex coronary 2007, catheterization 2008 patent stents. Normal LV function  . Chest pain   . CHF (congestive heart failure) ([place])   . COPD (chronic obstructive pulmonary disease) (HCC)    no home O2  . Depression   . Diabetes mellitus    Insulin dependent  . Diabetic polyneuropathy (HCC)     severe on multiple medications  . Dyslipidemia   . GERD (gastroesophageal reflux disease)   . Headache(784.0)   . 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 Hypertension   . MI (myocardial infarction) ([place])    2007  . Obstructive sleep apnea    CPAP, setting ?2  . Palpitations   . Tachycardia    BP (!) 149/75   Pulse 73   Temp (!) 97.5 F (36.4 C)   Ht 5' 4"  (1.626 m)   Wt 212 lb 3.2 oz (96.3 kg) Comment: wheelchair weighs 39.8lbs  SpO2 95%   BMI 36.42 kg/m   Opioid Risk Score:   Fall Risk Score:  `1  Depression screen PHQ 2/9  Depression screen [facility] 2/9 03/24/2019 07/16/2018 06/12/2017 04/14/2017 02/17/2017 01/14/2017 12/24/2016  Decreased Interest 1 1 3 3  0 3 0  Down, Depressed, Hopeless 1 1 3 3  0 3 0  PHQ - 2 Score 2 2 6 6  0 6 0  Altered sleeping - - 3 - - - -  Tired, decreased energy - - 3 - - - -    Change in appetite - - 0 - - - -  Feeling bad or failure about yourself  - - 3 - - - -  Trouble concentrating - - 3 - - - -  Moving slowly or fidgety/restless - - 1 - - - -  Suicidal thoughts - - 0 - - - -  PHQ-9 Score - - 19 - - - -  Difficult doing work/chores - - Very difficult - - - -  Some recent data might be hidden   Review of Systems     Objective:   Physical Exam Vitals and nursing note reviewed.  Constitutional:      Appearance: Normal appearance. She is obese.  Neck:     Comments: Cervical Paraspinal Tenderness: C-5-C-6 Cardiovascular:     Rate and Rhythm: Normal rate and regular rhythm.     Pulses: Normal pulses.     Heart sounds: Normal heart sounds.  Pulmonary:     Effort: Pulmonary effort is normal.     Breath sounds: Normal breath sounds.  Musculoskeletal:     Cervical back: Normal range of motion and neck supple.     Comments: Normal Muscle Bulk and Muscle Testing Reveals:  Upper Extremities: Full ROM and Muscle Strength 4/5 Bilateral AC Joint Tenderness Thoracic and Lumbar Hypersensitivities Left greater Trochanter Tenderness Lower Extremities: Decreased ROM and Muscle Strength 5/5 Bilateral Lower Extremities Flexion Produces Pain into her Bilateral Lower Extremities Arrived in wheelchair  Skin:    General: Skin is warm and dry.  Neurological:     Mental Status: She is alert and oriented to person, place, and time.  Psychiatric:        Mood and Affect: Mood normal.        Behavior: Behavior normal.           Assessment & Plan:  1.Lumbar pain lumbar spondylosis: Encouraged to continue toincrease Activity as tolerated.07/01/2019. Continue current medication regimen.Refilled: MS Contin 30 MG one tablet every 12 hours #60. We will continue the opioid monitoring program, this consists of regular  clinic visits, examinations, urine drug screen, pill counts as well as use of [place] Controlled Substance Reporting System. 2. Lumbar  Radiculitis: Continuecurrent medication regimen withGabapentin.07/01/2019 3. Bilateral Knee Pain/ Degenerative:No complaints today. Continue Voltaren Gel. Continue to Monitor.07/01/2019 4. Severe diabetic poly neuropathy: Continuecurrent medication regimen withGabapentin.07/01/2019 5. Insomnia: ContinueTrazodone PsychiatryFollowing.07/01/2019 6.Anxiety/Depression:Lorazepam,ProzacandHydroxyzinePrescribed byPsychiatry.04/016/2021 7. De Quervains Tenosynovitis: No Complaints Today.07/01/2019 8.BilateralGreater Trochanteric Bursitis:Continue with Ice and Heat Therapy. Continue to Monitor.07/01/2019. 9. Cervicalgia/ Cervical Radiculitis:Continue Gabapentin.Continue with HEP as Tolerated and continue to monitor.07/01/2019 10. Chronic Bilateral Thoracic Back Pain: Continue HEP as Tolerated. Continue current medication regimen. Continue to monitor.07/01/2019.  F/U in 1 month

## 2019-07-05 ENCOUNTER — Encounter: Payer: Self-pay | Admitting: Registered Nurse

## 2019-07-12 ENCOUNTER — Telehealth: Payer: Self-pay | Admitting: Cardiovascular Disease

## 2019-07-12 NOTE — Telephone Encounter (Signed)
  Patient Consent for Virtual Visit         [name] has provided verbal consent on 07/12/2019 for a virtual visit (video or telephone).   CONSENT FOR VIRTUAL VISIT FOR:  [name]  By participating in this virtual visit I agree to the following:  I hereby voluntarily request, consent and authorize [facility] and its employed or contracted physicians, physician assistants, nurse practitioners or other licensed health care professionals (the Practitioner), to provide me with telemedicine health care services (the "Services") as deemed necessary by the treating Practitioner. I acknowledge and consent to receive the Services by the Practitioner via telemedicine. I understand that the telemedicine visit will involve communicating with the Practitioner through live audiovisual communication technology and the disclosure of certain medical information by electronic transmission. I acknowledge that I have been given the opportunity to request an in-person assessment or other available alternative prior to the telemedicine visit and am voluntarily participating in the telemedicine visit.  I understand that I have the right to withhold or withdraw my consent to the use of telemedicine in the course of my care at any time, without affecting my right to future care or treatment, and that the Practitioner or I may terminate the telemedicine visit at any time. I understand that I have the right to inspect all information obtained and/or recorded in the course of the telemedicine visit and may receive copies of available information for a reasonable fee.  I understand that some of the potential risks of receiving the Services via telemedicine include:  Marland Kitchen Delay or interruption in medical evaluation due to technological equipment failure or disruption; . Information transmitted may not be sufficient (e.g. poor resolution of images) to allow for appropriate medical decision making by the Practitioner;  and/or  . In rare instances, security protocols could fail, causing a breach of personal health information.  Furthermore, I acknowledge that it is my responsibility to provide information about my medical history, conditions and care that is complete and accurate to the best of my ability. I acknowledge that Practitioner's advice, recommendations, and/or decision may be based on factors not within their control, such as incomplete or inaccurate data provided by me or distortions of diagnostic images or specimens that may result from electronic transmissions. I understand that the practice of medicine is not an exact science and that Practitioner makes no warranties or guarantees regarding treatment outcomes. I acknowledge that a copy of this consent can be made available to me via my patient portal ([facility]), or I can request a printed copy by calling the office of [facility].    I understand that my insurance will be billed for this visit.   I have read or had this consent read to me. . I understand the contents of this consent, which adequately explains the benefits and risks of the Services being provided via telemedicine.  . I have been provided ample opportunity to ask questions regarding this consent and the Services and have had my questions answered to my satisfaction. . I give my informed consent for the services to be provided through the use of telemedicine in my medical care

## 2019-07-15 ENCOUNTER — Telehealth (INDEPENDENT_AMBULATORY_CARE_PROVIDER_SITE_OTHER): Payer: Medicare Other | Admitting: Cardiovascular Disease

## 2019-07-15 ENCOUNTER — Encounter: Payer: Self-pay | Admitting: Cardiovascular Disease

## 2019-07-15 VITALS — Ht 64.0 in | Wt 212.0 lb

## 2019-07-15 DIAGNOSIS — I10 Essential (primary) hypertension: Secondary | ICD-10-CM | POA: Diagnosis not present

## 2019-07-15 DIAGNOSIS — I25118 Atherosclerotic heart disease of native coronary artery with other forms of angina pectoris: Secondary | ICD-10-CM

## 2019-07-15 DIAGNOSIS — G4733 Obstructive sleep apnea (adult) (pediatric): Secondary | ICD-10-CM | POA: Diagnosis not present

## 2019-07-15 DIAGNOSIS — I5032 Chronic diastolic (congestive) heart failure: Secondary | ICD-10-CM | POA: Diagnosis not present

## 2019-07-15 DIAGNOSIS — Z955 Presence of coronary angioplasty implant and graft: Secondary | ICD-10-CM

## 2019-07-15 DIAGNOSIS — E785 Hyperlipidemia, unspecified: Secondary | ICD-10-CM

## 2019-07-15 NOTE — Patient Instructions (Signed)
Your physician wants you to follow-up in: 1 YEAR WITH DR KONESWARAN You will receive a reminder letter in the mail two months in advance. If you don't receive a letter, please call our office to schedule the follow-up appointment.  Your physician recommends that you continue on your current medications as directed. Please refer to the Current Medication list given to you today.  Thank you for choosing [facility]!!    

## 2019-07-15 NOTE — Progress Notes (Signed)
Virtual Visit via Telephone Note   This visit type was conducted due to national recommendations for restrictions regarding the COVID-19 Pandemic (e.g. social distancing) in an effort to limit this patient's exposure and mitigate transmission in our community.  Due to her co-morbid illnesses, this patient is at least at moderate risk for complications without adequate follow up.  This format is felt to be most appropriate for this patient at this time.  The patient did not have access to video technology/had technical difficulties with video requiring transitioning to audio format only (telephone).  All issues noted in this document were discussed and addressed.  No physical exam could be performed with this format.  Please refer to the patient's chart for her  consent to telehealth for [facility].   The patient was identified using 2 identifiers.  Date:  07/15/2019   ID:  [name], DOB [birth year], MRN [id number]  Patient Location: Home Provider Location: Home  PCP:  Glenda Chroman, MD  Cardiologist:  Kate Sable, MD  Electrophysiologist:  None   Evaluation Performed:  Follow-Up Visit  Chief Complaint:  CAD  History of Present Illness:    [name] is a 69 y.o. female with a history of an MI in 2007 with PCI of the circumflex. She also has a history of insulin-dependent diabetes mellitus, sleep apnea, irritable bowel syndrome, GERD, chronic diastolic heart failure, tachycardia and palpitations, and dyslipidemia.  She had COVID19 in January and was hospitalized for a month at [facility].  She was in rehab and got back home on 06/18/19.  She is ambulating with a walker. She is slowly regaining her energy.  Her boyfriend, Joe,  stays with her and has a CNA during the day.     Past Medical History:  Diagnosis Date  . Anxiety and depression   . Arthritis   . CAD (coronary artery disease)    Stent placement circumflex coronary 2007, catheterization 2008  patent stents. Normal LV function  . Chest pain   . CHF (congestive heart failure) ([place])   . COPD (chronic obstructive pulmonary disease) (HCC)    no home O2  . Depression   . Diabetes mellitus    Insulin dependent  . Diabetic polyneuropathy (HCC)     severe on multiple medications  . Dyslipidemia   . GERD (gastroesophageal reflux disease)   . Headache(784.0)   . 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 Hypertension   . MI (myocardial infarction) ([place])    2007  . Obstructive sleep apnea    CPAP, setting ?2  . Palpitations   . Tachycardia    Past Surgical History:  Procedure Laterality Date  . ABDOMINAL HYSTERECTOMY    . Arm Surgery Bilateral    due to fall-pt broke both forearms  . BIOPSY N/A 07/01/2013   Procedure: BIOPSY;  Surgeon: Rogene Houston, MD;  Location: [facility];  Service: Endoscopy;  Laterality: N/A;  . CHOLECYSTECTOMY    . COLONOSCOPY  08/06/2011   Procedure: COLONOSCOPY;  Surgeon: Rogene Houston, MD;  Location: [facility];  Service: Endoscopy;  Laterality: N/A;  215  . COLONOSCOPY WITH PROPOFOL N/A 12/31/2012   Procedure: COLONOSCOPY WITH PROPOFOL;  Surgeon: Rogene Houston, MD;  Location: [facility];  Service: Endoscopy;  Laterality: N/A;  in cecum at 0814, total withdrawal time 5mn  . COLONOSCOPY WITH PROPOFOL N/A 02/05/2018   Procedure: COLONOSCOPY WITH PROPOFOL;  Surgeon: RRogene Houston MD;  Location: [facility];  Service:  Endoscopy;  Laterality: N/A;  1:25  . CORONARY ANGIOPLASTY WITH STENT PLACEMENT    . ESOPHAGEAL DILATION N/A 06/07/2015   Procedure: ESOPHAGEAL DILATION;  Surgeon: Rogene Houston, MD;  [facility];  Service: Endoscopy;  Laterality: N/A;  . ESOPHAGOGASTRODUODENOSCOPY N/A 06/07/2015   Procedure: ESOPHAGOGASTRODUODENOSCOPY (EGD);  Surgeon: Rogene Houston, MD;  [facility]: [facility];  Service: Endoscopy;  Laterality: N/A;  2:45 - moved to 1:00 - Ann to notify pt  . ESOPHAGOGASTRODUODENOSCOPY (EGD) WITH PROPOFOL N/A 12/31/2012   Procedure:  ESOPHAGOGASTRODUODENOSCOPY (EGD) WITH PROPOFOL;  Surgeon: Rogene Houston, MD;  Location: [facility];  Service: Endoscopy;  Laterality: N/A;  GE junction 37,  . ESOPHAGOGASTRODUODENOSCOPY (EGD) WITH PROPOFOL N/A 07/01/2013   Procedure: ESOPHAGOGASTRODUODENOSCOPY (EGD) WITH PROPOFOL;  Surgeon: Rogene Houston, MD;  Location: [facility];  Service: Endoscopy;  Laterality: N/A;  950  . MALONEY DILATION N/A 12/31/2012   Procedure: MALONEY DILATION;  Surgeon: Rogene Houston, MD;  Location: [facility];  Service: Endoscopy;  Laterality: N/A;  used a # 54,#56  . MALONEY DILATION N/A 07/01/2013   Procedure: Venia Minks DILATION;  Surgeon: Rogene Houston, MD;  Location: [facility];  Service: Endoscopy;  Laterality: N/A;  54/58; no heme present  . POLYPECTOMY  02/05/2018   Procedure: POLYPECTOMY;  Surgeon: Rogene Houston, MD;  [facility];  Service: Endoscopy;;  distal rectum (HS)     Current Meds  Medication Sig  . aspirin EC 81 MG tablet Take 1 tablet (81 mg total) by mouth daily.  Marland Kitchen atorvastatin (LIPITOR) 40 MG tablet Take 40 mg by mouth daily.  Marland Kitchen buPROPion (WELLBUTRIN XL) 150 MG 24 hr tablet Take 150-300 mg by mouth See admin instructions. Take 300 mg by mouth in the morning and take 150 mg by mouth at noon  . Cholecalciferol (VITAMIN D3) 125 MCG (5000 UT) CAPS TAKE ONE CAPSULE BY MOUTH DAILY  . diclofenac Sodium (VOLTAREN) 1 % GEL APPLY TO BOTH KNEES THREE TIMES DAILY  . estrogens, conjugated, (PREMARIN) 1.25 MG tablet Take 1.25 mg by mouth daily.   Marland Kitchen FLUoxetine (PROZAC) 20 MG capsule Take 20 mg by mouth 2 (two) times daily.   Marland Kitchen gabapentin (NEURONTIN) 400 MG capsule Take 400 mg by mouth 3 (three) times daily.  Marland Kitchen glipiZIDE (GLUCOTROL XL) 5 MG 24 hr tablet TAKE 1 TABLET BY MOUTH EVERY DAY WITH BREAKFAST  . hydrOXYzine (ATARAX/VISTARIL) 10 MG tablet Take 10 mg by mouth 4 (four) times daily as needed for anxiety.  . lidocaine (XYLOCAINE) 5 % ointment Apply 1 application topically at bedtime.   Marland Kitchen lisinopril  (ZESTRIL) 5 MG tablet TAKE 1 TABLET BY MOUTH DAILY  . LORazepam (ATIVAN) 0.5 MG tablet Take 0.5 mg by mouth 2 (two) times daily as needed for anxiety.   . meloxicam (MOBIC) 7.5 MG tablet Take 1 tablet (7.5 mg total) by mouth daily.  . metFORMIN (GLUCOPHAGE) 500 MG tablet TAKE 1 TABLET BY MOUTH TWICE DAILY  . methocarbamol (ROBAXIN) 500 MG tablet Take 1 tablet (500 mg total) by mouth 3 (three) times daily. (Patient taking differently: Take 500 mg by mouth 3 (three) times daily as needed for muscle spasms. )  . metoCLOPramide (REGLAN) 5 MG tablet Take 5 mg by mouth 3 (three) times daily as needed for nausea or vomiting.   . metoprolol succinate (TOPROL-XL) 50 MG 24 hr tablet TAKE 1 AND 1/2 TABLETS BY MOUTH EVERY DAY  . morphine (MS CONTIN) 30 MG 12 hr tablet Take  1 tablet (30 mg total) by mouth every 12 (twelve) hours.  . nitroGLYCERIN (NITROSTAT) 0.4 MG SL tablet Place 1 tablet (0.4 mg total) under the tongue every 5 (five) minutes x 3 doses as needed. For chest pains  . ondansetron (ZOFRAN) 4 MG tablet Take 4 mg by mouth every 6 (six) hours as needed for nausea or vomiting.  . pantoprazole (PROTONIX) 40 MG tablet TAKE 1 TABLET BY MOUTH TWICE DAILY BEFORE MEALS  . potassium chloride (K-DUR,KLOR-CON) 10 MEQ tablet Take 10 mEq by mouth 2 (two) times daily.  Marland Kitchen PROAIR HFA 108 (90 Base) MCG/ACT inhaler Inhale 2 puffs into the lungs every 4 (four) hours as needed for wheezing or shortness of breath.   . sucralfate (CARAFATE) 1 g tablet Take 1 tablet (1 g total) by mouth 4 (four) times daily -  with meals and at bedtime.  . topiramate (TOPAMAX) 200 MG tablet Take 200 mg by mouth at bedtime.  . TRESIBA FLEXTOUCH 100 UNIT/ML SOPN FlexTouch Pen INJECT 60 UNITS INTO THE SKIN AT BEDTIME  . zolpidem (AMBIEN) 10 MG tablet Take 10 mg by mouth at bedtime.  . [DISCONTINUED] furosemide (LASIX) 20 MG tablet Take 20 mg by mouth daily as needed for fluid.   . [DISCONTINUED] montelukast (SINGULAIR) 10 MG tablet Take 10  mg by mouth daily.  . [DISCONTINUED] ranitidine (ZANTAC) 300 MG tablet Take 1 tablet (300 mg total) by mouth at bedtime as needed for heartburn. (Patient taking differently: Take 300 mg by mouth 2 (two) times daily. )     Allergies:   Amphetamine-dextroamphetamine, Nitrofuran derivatives, Amphetamine-dextroamphet er, Pregabalin, Topiramate, and Verelan [verapamil]   Social History   Tobacco Use  . Smoking status: Never Smoker  . Smokeless tobacco: Never Used  Substance Use Topics  . Alcohol use: No    Alcohol/week: 0.0 standard drinks  . Drug use: No     Family Hx: The patient's family history includes Cancer in her brother, brother, maternal aunt, maternal uncle, paternal aunt, paternal uncle, and another family member; Colon cancer in her mother; Coronary artery disease in an other family member; Heart attack in her father and mother; Hemophilia in her child; Stroke in her sister.  ROS:   Please see the history of present illness.     All other systems reviewed and are negative.   Prior CV studies:   The following studies were reviewed today:  Echocardiogram 04/05/2019 [facility]):  Summary  1. The left ventricle is normal in size with mildly increased wall thickness.  2. The left ventricular systolic function is normal, LVEF is visually estimated at > 55%. Grade 2 diastolic dysfunction.  3. The left atrium is mildly dilated in size.  4. The right ventricle is mildly dilated in size, with normal systolic function.  5. The right atrium is mildly dilated in size.   Labs/Other Tests and Data Reviewed:    EKG:  No ECG reviewed.  Recent Labs: 11/02/2018: ALT 13; BUN 13; Creat 0.70; Potassium 4.5; Sodium 140   Recent Lipid Panel Lab Results  Component Value Date/Time   CHOL 173 07/06/2018 09:36 AM   CHOL 184 04/15/2016 11:21 AM   TRIG 109 07/06/2018 09:36 AM   HDL 62 07/06/2018 09:36 AM   HDL 77 04/15/2016 11:21 AM   CHOLHDL 2.8 07/06/2018 09:36 AM    LDLCALC 90 07/06/2018 09:36 AM    Wt Readings from Last 3 Encounters:  07/15/19 212 lb (96.2 kg)  07/01/19 212 lb 3.2 oz (96.3  kg)  03/24/19 237 lb (107.5 kg)     Objective:    Vital Signs:  Ht 5' 4"  (1.626 m)   Wt 212 lb (96.2 kg)   BMI 36.39 kg/m    VITAL SIGNS:  reviewed  ASSESSMENT & PLAN:    1.  Coronary artery disease: Stable ischemic heart disease.  Normal nuclear stress test in June 2018.  Continue aspirin, atorvastatin, and metoprolol succinate.  2.  Chronic diastolic heart failure: Euvolemic. No longer on Lasix. Grade 2 diastolic dysfunction by echo on 04/05/19 at [facility].  3.  Hypertension: I previously started lisinopril. No changes.  4.  Hyperlipidemia:Continue atorvastatin.  5.  Obstructive sleep apnea: Needs compliance with CPAP.    COVID-19 Education: The signs and symptoms of COVID-19 were discussed with the patient and how to seek care for testing (follow up with PCP or arrange E-visit).  The importance of social distancing was discussed today.  Time:   Today, I have spent 20 minutes with the patient with telehealth technology discussing the above problems.     Medication Adjustments/Labs and Tests Ordered: Current medicines are reviewed at length with the patient today.  Concerns regarding medicines are outlined above.   Tests Ordered: No orders of the defined types were placed in this encounter.   Medication Changes: No orders of the defined types were placed in this encounter.   Follow Up:  In Person in 1 year(s)  Signed, Kate Sable, MD  07/15/2019 10:53 AM    [place]

## 2019-07-19 ENCOUNTER — Other Ambulatory Visit: Payer: Self-pay | Admitting: "Endocrinology

## 2019-07-19 ENCOUNTER — Other Ambulatory Visit: Payer: Self-pay | Admitting: Physical Medicine & Rehabilitation

## 2019-07-21 ENCOUNTER — Other Ambulatory Visit: Payer: Self-pay

## 2019-07-21 ENCOUNTER — Encounter: Payer: Medicare Other | Attending: Registered Nurse | Admitting: Registered Nurse

## 2019-07-21 VITALS — BP 132/73 | HR 71 | Temp 98.5°F | Ht 64.0 in | Wt 196.8 lb

## 2019-07-21 DIAGNOSIS — G4700 Insomnia, unspecified: Secondary | ICD-10-CM | POA: Diagnosis not present

## 2019-07-21 DIAGNOSIS — K219 Gastro-esophageal reflux disease without esophagitis: Secondary | ICD-10-CM | POA: Diagnosis not present

## 2019-07-21 DIAGNOSIS — M7062 Trochanteric bursitis, left hip: Secondary | ICD-10-CM | POA: Insufficient documentation

## 2019-07-21 DIAGNOSIS — E1142 Type 2 diabetes mellitus with diabetic polyneuropathy: Secondary | ICD-10-CM | POA: Diagnosis not present

## 2019-07-21 DIAGNOSIS — J449 Chronic obstructive pulmonary disease, unspecified: Secondary | ICD-10-CM | POA: Diagnosis not present

## 2019-07-21 DIAGNOSIS — I252 Old myocardial infarction: Secondary | ICD-10-CM | POA: Insufficient documentation

## 2019-07-21 DIAGNOSIS — Z823 Family history of stroke: Secondary | ICD-10-CM | POA: Insufficient documentation

## 2019-07-21 DIAGNOSIS — M5416 Radiculopathy, lumbar region: Secondary | ICD-10-CM

## 2019-07-21 DIAGNOSIS — F329 Major depressive disorder, single episode, unspecified: Secondary | ICD-10-CM | POA: Insufficient documentation

## 2019-07-21 DIAGNOSIS — M7061 Trochanteric bursitis, right hip: Secondary | ICD-10-CM | POA: Diagnosis not present

## 2019-07-21 DIAGNOSIS — G894 Chronic pain syndrome: Secondary | ICD-10-CM | POA: Insufficient documentation

## 2019-07-21 DIAGNOSIS — F419 Anxiety disorder, unspecified: Secondary | ICD-10-CM | POA: Diagnosis not present

## 2019-07-21 DIAGNOSIS — Z79891 Long term (current) use of opiate analgesic: Secondary | ICD-10-CM

## 2019-07-21 DIAGNOSIS — M25562 Pain in left knee: Secondary | ICD-10-CM | POA: Insufficient documentation

## 2019-07-21 DIAGNOSIS — M48062 Spinal stenosis, lumbar region with neurogenic claudication: Secondary | ICD-10-CM

## 2019-07-21 DIAGNOSIS — I2510 Atherosclerotic heart disease of native coronary artery without angina pectoris: Secondary | ICD-10-CM | POA: Diagnosis not present

## 2019-07-21 DIAGNOSIS — M542 Cervicalgia: Secondary | ICD-10-CM | POA: Diagnosis not present

## 2019-07-21 DIAGNOSIS — G8929 Other chronic pain: Secondary | ICD-10-CM | POA: Diagnosis present

## 2019-07-21 DIAGNOSIS — M25561 Pain in right knee: Secondary | ICD-10-CM | POA: Diagnosis not present

## 2019-07-21 DIAGNOSIS — I25118 Atherosclerotic heart disease of native coronary artery with other forms of angina pectoris: Secondary | ICD-10-CM | POA: Diagnosis not present

## 2019-07-21 DIAGNOSIS — M5412 Radiculopathy, cervical region: Secondary | ICD-10-CM

## 2019-07-21 DIAGNOSIS — M4726 Other spondylosis with radiculopathy, lumbar region: Secondary | ICD-10-CM | POA: Diagnosis not present

## 2019-07-21 DIAGNOSIS — I509 Heart failure, unspecified: Secondary | ICD-10-CM | POA: Diagnosis not present

## 2019-07-21 DIAGNOSIS — Z8249 Family history of ischemic heart disease and other diseases of the circulatory system: Secondary | ICD-10-CM | POA: Insufficient documentation

## 2019-07-21 DIAGNOSIS — M546 Pain in thoracic spine: Secondary | ICD-10-CM | POA: Diagnosis not present

## 2019-07-21 DIAGNOSIS — M545 Low back pain: Secondary | ICD-10-CM | POA: Diagnosis not present

## 2019-07-21 DIAGNOSIS — E785 Hyperlipidemia, unspecified: Secondary | ICD-10-CM | POA: Diagnosis not present

## 2019-07-21 DIAGNOSIS — Z5181 Encounter for therapeutic drug level monitoring: Secondary | ICD-10-CM | POA: Insufficient documentation

## 2019-07-21 DIAGNOSIS — Z955 Presence of coronary angioplasty implant and graft: Secondary | ICD-10-CM | POA: Diagnosis not present

## 2019-07-21 DIAGNOSIS — M1711 Unilateral primary osteoarthritis, right knee: Secondary | ICD-10-CM

## 2019-07-21 DIAGNOSIS — I110 Hypertensive heart disease with heart failure: Secondary | ICD-10-CM | POA: Diagnosis not present

## 2019-07-21 DIAGNOSIS — G4733 Obstructive sleep apnea (adult) (pediatric): Secondary | ICD-10-CM | POA: Insufficient documentation

## 2019-07-21 MED ORDER — MORPHINE SULFATE ER 30 MG PO TBCR: 30.0000 mg | EXTENDED_RELEASE_TABLET | Freq: Two times a day (BID) | ORAL | 0 refills | Status: DC

## 2019-07-21 NOTE — Progress Notes (Signed)
Subjective:    Patient ID: [name], female    DOB: January 14, 1951, 69 y.o.   MRN: [id number]  HPI: [name] is a 68 y.o. female who returns for follow up appointment for chronic pain and medication refill. She states her pain is located in her mid- lower back pain radiating into her bilateral hips L>R and bilateral lower extremities. Also reports bilateral knee pain. She rates her pain 8. Her current exercise regime is walking short distances with her walker in her home.   [name] Morphine equivalent is 60.00  MME. She  is also prescribed Lorazepam by Dr. Reece Levy. We have discussed the black box warning of using opioids and benzodiazepines. I highlighted the dangers of using these drugs together and discussed the adverse events including respiratory suppression, overdose, cognitive impairment and importance of compliance with current regimen. We will continue to monitor and adjust as indicated.   She  is being closely monitored and under the care of her psychiatrist Dr. Reece Levy.   Oral Swab Performed Today  Pain Inventory Average Pain 7 Pain Right Now 8 My pain is sharp, burning, dull, stabbing, tingling and aching  In the last 24 hours, has pain interfered with the following? General activity 8 Relation with others 8 Enjoyment of life 5 What TIME of day is your pain at its worst? morning and night Sleep (in general) Fair  Pain is worse with: walking, bending, sitting and inactivity Pain improves with: medication Relief from Meds: 5  Mobility ability to climb steps?  no do you drive?  no use a wheelchair  Function disabled: date disabled .  Neuro/Psych weakness tingling trouble walking dizziness depression loss of taste or smell  Prior Studies Any changes since last visit?  no  Physicians involved in your care Any changes since last visit?  no   Family History  Problem Relation Age of Onset  . Heart attack Mother   . Colon cancer Mother   . Heart  attack Father   . Stroke Sister   . Hemophilia Child   . Cancer Other   . Coronary artery disease Other   . Cancer Brother   . Cancer Maternal Aunt   . Cancer Maternal Uncle   . Cancer Paternal Aunt   . Cancer Paternal Uncle   . Cancer Brother    Social History   Socioeconomic History  . Marital status: Legally Separated    Spouse name: Not on file  . Number of children: 4  . Years of education: Not on file  . Highest education level: Not on file  Occupational History  . Occupation: Disabled  Tobacco Use  . Smoking status: Never Smoker  . Smokeless tobacco: Never Used  Substance and Sexual Activity  . Alcohol use: No    Alcohol/week: 0.0 standard drinks  . Drug use: No  . Sexual activity: Not on file  Other Topics Concern  . Not on file  Social History Narrative   Patient does not get regular exercise   Denies caffeine use    Social Determinants of Health   Financial Resource Strain:   . Difficulty of Paying Living Expenses:   Food Insecurity:   . Worried About Charity fundraiser in the Last Year:   . Arboriculturist in the Last Year:   Transportation Needs:   . Film/video editor (Medical):   Marland Kitchen Lack of Transportation (Non-Medical):   Physical Activity:   . Days of Exercise per Week:   .  Minutes of Exercise per Session:   Stress:   . Feeling of Stress :   Social Connections:   . Frequency of Communication with Friends and Family:   . Frequency of Social Gatherings with Friends and Family:   . Attends Religious Services:   . Active Member of Clubs or Organizations:   . Attends Archivist Meetings:   Marland Kitchen Marital Status:    Past Surgical History:  Procedure Laterality Date  . ABDOMINAL HYSTERECTOMY    . Arm Surgery Bilateral    due to fall-pt broke both forearms  . BIOPSY N/A 07/01/2013   Procedure: BIOPSY;  Surgeon: Rogene Houston, MD;  [facility]: [facility];  Service: Endoscopy;  Laterality: N/A;  . CHOLECYSTECTOMY    . COLONOSCOPY  08/06/2011     Procedure: COLONOSCOPY;  Surgeon: Rogene Houston, MD;  [facility];  Service: Endoscopy;  Laterality: N/A;  215  . COLONOSCOPY WITH PROPOFOL N/A 12/31/2012   Procedure: COLONOSCOPY WITH PROPOFOL;  Surgeon: Rogene Houston, MD;  Location: [facility];  Service: Endoscopy;  Laterality: N/A;  in cecum at 0814, total withdrawal time 44mn  . COLONOSCOPY WITH PROPOFOL N/A 02/05/2018   Procedure: COLONOSCOPY WITH PROPOFOL;  Surgeon: RRogene Houston MD;  [facility];  Service: Endoscopy;  Laterality: N/A;  1:25  . CORONARY ANGIOPLASTY WITH STENT PLACEMENT    . ESOPHAGEAL DILATION N/A 06/07/2015   Procedure: ESOPHAGEAL DILATION;  Surgeon: NRogene Houston MD;  [facility];  Service: Endoscopy;  Laterality: N/A;  . ESOPHAGOGASTRODUODENOSCOPY N/A 06/07/2015   Procedure: ESOPHAGOGASTRODUODENOSCOPY (EGD);  Surgeon: NRogene Houston MD;  [facility];  Service: Endoscopy;  Laterality: N/A;  2:45 - moved to 1:00 - Ann to notify pt  . ESOPHAGOGASTRODUODENOSCOPY (EGD) WITH PROPOFOL N/A 12/31/2012   Procedure: ESOPHAGOGASTRODUODENOSCOPY (EGD) WITH PROPOFOL;  Surgeon: NRogene Houston MD;  [facility];  Service: Endoscopy;  Laterality: N/A;  GE junction 37,  . ESOPHAGOGASTRODUODENOSCOPY (EGD) WITH PROPOFOL N/A 07/01/2013   Procedure: ESOPHAGOGASTRODUODENOSCOPY (EGD) WITH PROPOFOL;  Surgeon: NRogene Houston MD;  [facility];  Service: Endoscopy;  Laterality: N/A;  950  . MALONEY DILATION N/A 12/31/2012   Procedure: MALONEY DILATION;  Surgeon: NRogene Houston MD;  [facility];  Service: Endoscopy;  Laterality: N/A;  used a # 54,#56  . MALONEY DILATION N/A 07/01/2013   Procedure: MVenia MinksDILATION;  Surgeon: NRogene Houston MD;  [facility];  Service: Endoscopy;  Laterality: N/A;  54/58; no heme present  . POLYPECTOMY  02/05/2018   Procedure: POLYPECTOMY;  Surgeon: RRogene Houston MD;  [facility];  Service: Endoscopy;;  distal rectum (HS)    Past Medical History:  Diagnosis Date  . Anxiety and depression   . Arthritis   . CAD (coronary artery disease)    Stent placement circumflex coronary 2007, catheterization 2008 patent stents. Normal LV function  . Chest pain   . [place]   . COPD (chronic obstructive pulmonary disease) (HCC)    no home O2  . Depression   . Diabetes mellitus    Insulin dependent  . Diabetic polyneuropathy (HCC)     severe on multiple medications  . Dyslipidemia   . GERD (gastroesophageal reflux disease)   . Headache(784.0)   . 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 Hypertension   . MI (myocardial infarction) ([facility]    2007  . Obstructive sleep apnea    CPAP, setting ?  2  . Palpitations   . Tachycardia    BP 132/73   Pulse 71   Temp 98.5 F (36.9 C)   Ht 5' 4"  (1.626 m)   Wt 196 lb 12.8 oz (89.3 kg)   SpO2 95%   BMI 33.78 kg/m   Opioid Risk Score:   Fall Risk Score:  `1  Depression screen PHQ 2/9  Depression screen [facility] 2/9 03/24/2019 07/16/2018 06/12/2017 04/14/2017 02/17/2017 01/14/2017 12/24/2016  Decreased Interest 1 1 3 3  0 3 0  Down, Depressed, Hopeless 1 1 3 3  0 3 0  PHQ - 2 Score 2 2 6 6  0 6 0  Altered sleeping - - 3 - - - -  Tired, decreased energy - - 3 - - - -  Change in appetite - - 0 - - - -  Feeling bad or failure about yourself  - - 3 - - - -  Trouble concentrating - - 3 - - - -  Moving slowly or fidgety/restless - - 1 - - - -  Suicidal thoughts - - 0 - - - -  PHQ-9 Score - - 19 - - - -  Difficult doing work/chores - - Very difficult - - - -  Some recent data might be hidden    Review of Systems  Gastrointestinal: Positive for nausea. Negative for diarrhea.  Musculoskeletal: Positive for gait problem.  Neurological: Positive for dizziness and weakness.  Psychiatric/Behavioral: Positive for confusion.  All other systems reviewed and are negative.      Objective:   Physical Exam Vitals and nursing note reviewed.  Constitutional:       Appearance: Normal appearance.  Cardiovascular:     Rate and Rhythm: Normal rate and regular rhythm.     Pulses: Normal pulses.     Heart sounds: Normal heart sounds.  Pulmonary:     Effort: Pulmonary effort is normal.     Breath sounds: Normal breath sounds.  Musculoskeletal:     Cervical back: Normal range of motion and neck supple.     Comments: Normal Muscle Bulk and Muscle Testing Reveals:  Upper Extremities: Full ROM and Muscle Strength  On the Right 5/5 and Left 4/5 Bilateral AC Joint Tenderness  Thoracic and Lumbar Hypersensitivity Bilateral Greater Trochanteric Tenderness: L>R  Lower Extremities: Decreased ROM and Muscle Strength 4/4 Bilateral Lower Extremities Flexion Produces Pain into her Lower Back and Bilateral Lower Extremities Arrived in wheelchair  Skin:    General: Skin is warm and dry.  Neurological:     Mental Status: She is alert and oriented to person, place, and time.  Psychiatric:        Mood and Affect: Mood normal.        Behavior: Behavior normal.           Assessment & Plan:  1.Lumbar pain lumbar spondylosis: Encouraged to continue toincrease Activity as tolerated.07/21/2019. Continue current medication regimen.Refilled: MS Contin 30 MG one tablet every 12 hours #60. We will continue the opioid monitoring program, this consists of regular clinic visits, examinations, urine drug screen, pill counts as well as use of [place] Controlled Substance Reporting System. 2. Lumbar Radiculitis: Continuecurrent medication regimen withGabapentin.07/21/2019 3. Bilateral Knee Pain/ Degenerative: Continue Voltaren Gel. Continue to Monitor.07/21/2019 4. Severe diabetic poly neuropathy: Continuecurrent medication regimen withGabapentin.07/21/2019 5. Insomnia: ContinueTrazodone Psychiatry Following.07/21/2019 6.Anxiety/Depression:Lorazepam,ProzacandHydroxyzinePrescribed byPsychiatry.07/21/2019 7. De Quervains Tenosynovitis: No Complaints  Today.07/21/2019 8.BilateralGreater Trochanteric Bursitis:Continue with Ice and Heat Therapy. Continue to Monitor.07/21/2019. 9. Cervicalgia/ Cervical Radiculitis:Continue Gabapentin.Continue with HEP  as Tolerated and continue to monitor.07/21/2019 10. Chronic Bilateral Thoracic Back Pain: Continue HEP as Tolerated. Continue current medication regimen. Continue to monitor.07/21/2019.  F/U in 1 month  15 minutes of face to face patient care time was spent during this visit. All questions were encouraged and answered.

## 2019-07-24 ENCOUNTER — Encounter: Payer: Self-pay | Admitting: Registered Nurse

## 2019-07-25 LAB — DRUG TOX MONITOR 1 W/CONF, ORAL FLD
Amphetamines: NEGATIVE ng/mL (ref <10)
Barbiturates: NEGATIVE ng/mL (ref <10)
Benzodiazepines: NEGATIVE ng/mL (ref <0.50)
Buprenorphine: NEGATIVE ng/mL (ref <0.10)
Cocaine: NEGATIVE ng/mL (ref <5.0)
Codeine: NEGATIVE ng/mL (ref <2.5)
Dihydrocodeine: 2.5 ng/mL — ABNORMAL HIGH (ref <2.5)
Fentanyl: NEGATIVE ng/mL (ref <0.10)
Heroin Metabolite: NEGATIVE ng/mL (ref <1.0)
Hydrocodone: 9.3 ng/mL — ABNORMAL HIGH (ref <2.5)
Hydromorphone: NEGATIVE ng/mL (ref <2.5)
MARIJUANA: NEGATIVE ng/mL (ref <2.5)
MDMA: NEGATIVE ng/mL (ref <10)
Meprobamate: NEGATIVE ng/mL (ref <2.5)
Methadone: NEGATIVE ng/mL (ref <5.0)
Morphine: 9.3 ng/mL — ABNORMAL HIGH (ref <2.5)
Nicotine Metabolite: NEGATIVE ng/mL (ref <5.0)
Norhydrocodone: NEGATIVE ng/mL (ref <2.5)
Noroxycodone: NEGATIVE ng/mL (ref <2.5)
Opiates: POSITIVE ng/mL — AB (ref <2.5)
Oxycodone: NEGATIVE ng/mL (ref <2.5)
Oxymorphone: NEGATIVE ng/mL (ref <2.5)
Phencyclidine: NEGATIVE ng/mL (ref <10)
Tapentadol: NEGATIVE ng/mL (ref <5.0)
Tramadol: NEGATIVE ng/mL (ref <5.0)
Zolpidem: NEGATIVE ng/mL (ref <5.0)

## 2019-07-25 LAB — DRUG TOX ALC METAB W/CON, ORAL FLD: Alcohol Metabolite: NEGATIVE ng/mL (ref <25)

## 2019-07-28 ENCOUNTER — Other Ambulatory Visit: Payer: Self-pay | Admitting: "Endocrinology

## 2019-07-28 DIAGNOSIS — E1159 Type 2 diabetes mellitus with other circulatory complications: Secondary | ICD-10-CM

## 2019-08-01 ENCOUNTER — Other Ambulatory Visit: Payer: Self-pay | Admitting: Registered Nurse

## 2019-08-03 ENCOUNTER — Telehealth: Payer: Self-pay | Admitting: *Deleted

## 2019-08-03 NOTE — Telephone Encounter (Addendum)
Ms [name]'s swab was positive for her morphine but there was also evidence of taking hydrocodone. I called and spoke with Ms [name] to ask where this may have come from .  She said that when she was in the nursing home they were giving her that and when she left they gave her the remainder and she had taken one when she was hurting so bad. I advised her not to do that any more unless she has permission. She agrees and says she does not have any more anyway.

## 2019-08-23 ENCOUNTER — Telehealth: Payer: Self-pay | Admitting: Registered Nurse

## 2019-08-23 MED ORDER — ALBUTEROL SULFATE HFA 108 (90 BASE) MCG/ACT IN AERS
2.00 | INHALATION_SPRAY | RESPIRATORY_TRACT | Status: DC
Start: ? — End: 2019-08-23

## 2019-08-23 MED ORDER — GENERIC EXTERNAL MEDICATION
3.38 | Status: DC
Start: 2019-08-23 — End: 2019-08-23

## 2019-08-23 MED ORDER — GLIPIZIDE 5 MG PO TABS
5.00 | ORAL_TABLET | ORAL | Status: DC
Start: 2019-08-24 — End: 2019-08-23

## 2019-08-23 MED ORDER — GABAPENTIN 400 MG PO CAPS
400.00 | ORAL_CAPSULE | ORAL | Status: DC
Start: 2019-08-23 — End: 2019-08-23

## 2019-08-23 MED ORDER — BUPROPION HCL ER (XL) 150 MG PO TB24
150.00 | ORAL_TABLET | ORAL | Status: DC
Start: 2019-08-24 — End: 2019-08-23

## 2019-08-23 MED ORDER — SERTRALINE HCL 50 MG PO TABS
100.00 | ORAL_TABLET | ORAL | Status: DC
Start: 2019-08-24 — End: 2019-08-23

## 2019-08-23 MED ORDER — ZOLPIDEM TARTRATE 5 MG PO TABS
5.00 | ORAL_TABLET | ORAL | Status: DC
Start: 2019-08-23 — End: 2019-08-23

## 2019-08-23 MED ORDER — ONDANSETRON 4 MG PO TBDP
4.00 | ORAL_TABLET | ORAL | Status: DC
Start: ? — End: 2019-08-23

## 2019-08-23 MED ORDER — SODIUM CHLORIDE 0.9 % IV SOLN
100.00 | INTRAVENOUS | Status: DC
Start: ? — End: 2019-08-23

## 2019-08-23 MED ORDER — INSULIN LISPRO 100 UNIT/ML ~~LOC~~ SOLN
2.00 | SUBCUTANEOUS | Status: DC
Start: 2019-08-23 — End: 2019-08-23

## 2019-08-23 MED ORDER — ASPIRIN 81 MG PO TBEC
81.00 | DELAYED_RELEASE_TABLET | ORAL | Status: DC
Start: 2019-08-24 — End: 2019-08-23

## 2019-08-23 MED ORDER — LORAZEPAM 0.5 MG PO TABS
0.50 | ORAL_TABLET | ORAL | Status: DC
Start: ? — End: 2019-08-23

## 2019-08-23 MED ORDER — ATORVASTATIN CALCIUM 40 MG PO TABS
40.00 | ORAL_TABLET | ORAL | Status: DC
Start: 2019-08-23 — End: 2019-08-23

## 2019-08-23 MED ORDER — PANTOPRAZOLE SODIUM 40 MG PO TBEC
40.00 | DELAYED_RELEASE_TABLET | ORAL | Status: DC
Start: 2019-08-23 — End: 2019-08-23

## 2019-08-23 MED ORDER — MORPHINE SULFATE 10 MG/ML IJ SOLN
2.00 | INTRAMUSCULAR | Status: DC
Start: ? — End: 2019-08-23

## 2019-08-23 MED ORDER — INSULIN LISPRO 100 UNIT/ML ~~LOC~~ SOLN
0.00 | SUBCUTANEOUS | Status: DC
Start: 2019-08-23 — End: 2019-08-23

## 2019-08-23 MED ORDER — LOPERAMIDE HCL 2 MG PO CAPS
2.00 | ORAL_CAPSULE | ORAL | Status: DC
Start: ? — End: 2019-08-23

## 2019-08-23 MED ORDER — GENERIC EXTERNAL MEDICATION
Status: DC
Start: 2019-08-24 — End: 2019-08-23

## 2019-08-23 MED ORDER — INSULIN GLARGINE 100 UNIT/ML ~~LOC~~ SOLN
15.00 | SUBCUTANEOUS | Status: DC
Start: 2019-08-23 — End: 2019-08-23

## 2019-08-23 MED ORDER — ACETAMINOPHEN 325 MG PO TABS
650.00 | ORAL_TABLET | ORAL | Status: DC
Start: ? — End: 2019-08-23

## 2019-08-23 NOTE — Telephone Encounter (Signed)
[name] canceled her future appointments due to being inpatient at [facility] currently

## 2019-08-24 ENCOUNTER — Encounter: Payer: Medicare Other | Admitting: Registered Nurse

## 2019-08-26 ENCOUNTER — Other Ambulatory Visit: Payer: Self-pay | Admitting: Registered Nurse

## 2019-08-26 ENCOUNTER — Ambulatory Visit: Payer: Medicare Other | Admitting: Registered Nurse

## 2019-08-30 ENCOUNTER — Ambulatory Visit: Payer: Medicare Other | Admitting: Registered Nurse

## 2019-08-31 ENCOUNTER — Encounter: Payer: Self-pay | Admitting: Registered Nurse

## 2019-08-31 ENCOUNTER — Other Ambulatory Visit: Payer: Self-pay

## 2019-08-31 ENCOUNTER — Encounter: Payer: Medicare Other | Attending: Registered Nurse | Admitting: Registered Nurse

## 2019-08-31 VITALS — BP 119/71 | HR 61

## 2019-08-31 DIAGNOSIS — F329 Major depressive disorder, single episode, unspecified: Secondary | ICD-10-CM | POA: Diagnosis not present

## 2019-08-31 DIAGNOSIS — I2510 Atherosclerotic heart disease of native coronary artery without angina pectoris: Secondary | ICD-10-CM | POA: Insufficient documentation

## 2019-08-31 DIAGNOSIS — M7061 Trochanteric bursitis, right hip: Secondary | ICD-10-CM | POA: Insufficient documentation

## 2019-08-31 DIAGNOSIS — J449 Chronic obstructive pulmonary disease, unspecified: Secondary | ICD-10-CM | POA: Diagnosis not present

## 2019-08-31 DIAGNOSIS — I509 Heart failure, unspecified: Secondary | ICD-10-CM | POA: Diagnosis not present

## 2019-08-31 DIAGNOSIS — M5412 Radiculopathy, cervical region: Secondary | ICD-10-CM | POA: Diagnosis not present

## 2019-08-31 DIAGNOSIS — G894 Chronic pain syndrome: Secondary | ICD-10-CM | POA: Insufficient documentation

## 2019-08-31 DIAGNOSIS — M4726 Other spondylosis with radiculopathy, lumbar region: Secondary | ICD-10-CM | POA: Insufficient documentation

## 2019-08-31 DIAGNOSIS — E1142 Type 2 diabetes mellitus with diabetic polyneuropathy: Secondary | ICD-10-CM | POA: Insufficient documentation

## 2019-08-31 DIAGNOSIS — M545 Low back pain: Secondary | ICD-10-CM | POA: Diagnosis not present

## 2019-08-31 DIAGNOSIS — G8929 Other chronic pain: Secondary | ICD-10-CM | POA: Insufficient documentation

## 2019-08-31 DIAGNOSIS — I25118 Atherosclerotic heart disease of native coronary artery with other forms of angina pectoris: Secondary | ICD-10-CM

## 2019-08-31 DIAGNOSIS — F419 Anxiety disorder, unspecified: Secondary | ICD-10-CM | POA: Insufficient documentation

## 2019-08-31 DIAGNOSIS — E785 Hyperlipidemia, unspecified: Secondary | ICD-10-CM | POA: Insufficient documentation

## 2019-08-31 DIAGNOSIS — M25561 Pain in right knee: Secondary | ICD-10-CM | POA: Diagnosis not present

## 2019-08-31 DIAGNOSIS — G4700 Insomnia, unspecified: Secondary | ICD-10-CM | POA: Insufficient documentation

## 2019-08-31 DIAGNOSIS — Z955 Presence of coronary angioplasty implant and graft: Secondary | ICD-10-CM | POA: Diagnosis not present

## 2019-08-31 DIAGNOSIS — K219 Gastro-esophageal reflux disease without esophagitis: Secondary | ICD-10-CM | POA: Insufficient documentation

## 2019-08-31 DIAGNOSIS — M542 Cervicalgia: Secondary | ICD-10-CM

## 2019-08-31 DIAGNOSIS — I110 Hypertensive heart disease with heart failure: Secondary | ICD-10-CM | POA: Diagnosis not present

## 2019-08-31 DIAGNOSIS — G4733 Obstructive sleep apnea (adult) (pediatric): Secondary | ICD-10-CM | POA: Insufficient documentation

## 2019-08-31 DIAGNOSIS — M48062 Spinal stenosis, lumbar region with neurogenic claudication: Secondary | ICD-10-CM

## 2019-08-31 DIAGNOSIS — M7062 Trochanteric bursitis, left hip: Secondary | ICD-10-CM | POA: Insufficient documentation

## 2019-08-31 DIAGNOSIS — Z5181 Encounter for therapeutic drug level monitoring: Secondary | ICD-10-CM | POA: Insufficient documentation

## 2019-08-31 DIAGNOSIS — M546 Pain in thoracic spine: Secondary | ICD-10-CM

## 2019-08-31 DIAGNOSIS — M5416 Radiculopathy, lumbar region: Secondary | ICD-10-CM

## 2019-08-31 DIAGNOSIS — M25562 Pain in left knee: Secondary | ICD-10-CM | POA: Diagnosis not present

## 2019-08-31 DIAGNOSIS — Z8249 Family history of ischemic heart disease and other diseases of the circulatory system: Secondary | ICD-10-CM | POA: Insufficient documentation

## 2019-08-31 DIAGNOSIS — I252 Old myocardial infarction: Secondary | ICD-10-CM | POA: Diagnosis not present

## 2019-08-31 DIAGNOSIS — Z79891 Long term (current) use of opiate analgesic: Secondary | ICD-10-CM | POA: Diagnosis present

## 2019-08-31 DIAGNOSIS — Z823 Family history of stroke: Secondary | ICD-10-CM | POA: Insufficient documentation

## 2019-08-31 MED ORDER — MORPHINE SULFATE ER 30 MG PO TBCR: 30.0000 mg | EXTENDED_RELEASE_TABLET | Freq: Two times a day (BID) | ORAL | 0 refills | Status: DC

## 2019-08-31 NOTE — Progress Notes (Addendum)
Subjective:    Patient ID: [name], female    DOB: 27-Nov-1950, 70 y.o.   MRN: [id number]  HPI: [name] is a 69 y.o. female who returns for follow up appointment for chronic pain and medication refill. She states her pain is located in her neck radiating into her bilateral shoulders, upper- lower back pain radiating into her bilateral lower extremities and bilateral hip pain. Also reports tingling and burning pain in her bilateral feet.  She rates her pain 9. Her current exercise regime is walking and performing stretching exercises.  [name] was admitted into [facility] on  08/22/2019 and discharged on 08/26/19 for Pyelonephritis, Discharged Summary Reviewed.   [name] Morphine equivalent is  60.00 MME. She  is also prescribed Lorazepam by Noemi Chapel. We have discussed the black box warning of using opioids and benzodiazepines. I highlighted the dangers of using these drugs together and discussed the adverse events including respiratory suppression, overdose, cognitive impairment and importance of compliance with current regimen. We will continue to monitor and adjust as indicated.  she is being closely monitored and under the care of her psychiatrist Dr Reece Levy.    Last Oral Swab was Performed on 07/21/2019, see note for detail.    Pain Inventory Average Pain 9 Pain Right Now 9 My pain is sharp, burning, dull, stabbing, tingling and aching  In the last 24 hours, has pain interfered with the following? General activity 7 Relation with others 8 Enjoyment of life 9 What TIME of day is your pain at its worst? night Sleep (in general) Poor  Pain is worse with: walking and standing Pain improves with: rest and medication Relief from Meds: 7  Mobility walk with assistance use a walker ability to climb steps?  no do you drive?  no use a wheelchair  Function disabled: date disabled .  Neuro/Psych bladder control problems bowel control  problems weakness numbness tingling trouble walking dizziness confusion depression anxiety  Prior Studies Any changes since last visit?  no  Physicians involved in your care Any changes since last visit?  no   Family History  Problem Relation Age of Onset  . Heart attack Mother   . Colon cancer Mother   . Heart attack Father   . Stroke Sister   . Hemophilia Child   . Cancer Other   . Coronary artery disease Other   . Cancer Brother   . Cancer Maternal Aunt   . Cancer Maternal Uncle   . Cancer Paternal Aunt   . Cancer Paternal Uncle   . Cancer Brother    Social History   Socioeconomic History  . Marital status: Legally Separated    Spouse name: Not on file  . Number of children: 4  . Years of education: Not on file  . Highest education level: Not on file  Occupational History  . Occupation: Disabled  Tobacco Use  . Smoking status: Never Smoker  . Smokeless tobacco: Never Used  Vaping Use  . Vaping Use: Never used  Substance and Sexual Activity  . Alcohol use: No    Alcohol/week: 0.0 standard drinks  . Drug use: No  . Sexual activity: Not on file  Other Topics Concern  . Not on file  Social History Narrative   Patient does not get regular exercise   Denies caffeine use    Social Determinants of Health   Financial Resource Strain:   . Difficulty of Paying Living Expenses:   Food Insecurity:   .  Worried About Charity fundraiser in the Last Year:   . Arboriculturist in the Last Year:   Transportation Needs:   . Film/video editor (Medical):   Marland Kitchen Lack of Transportation (Non-Medical):   Physical Activity:   . Days of Exercise per Week:   . Minutes of Exercise per Session:   Stress:   . Feeling of Stress :   Social Connections:   . Frequency of Communication with Friends and Family:   . Frequency of Social Gatherings with Friends and Family:   . Attends Religious Services:   . Active Member of Clubs or Organizations:   . Attends Theatre manager Meetings:   Marland Kitchen Marital Status:    Past Surgical History:  Procedure Laterality Date  . ABDOMINAL HYSTERECTOMY    . Arm Surgery Bilateral    due to fall-pt broke both forearms  . BIOPSY N/A 07/01/2013   Procedure: BIOPSY;  Surgeon: Rogene Houston, MD;  [facility];  Service: Endoscopy;  Laterality: N/A;  . CHOLECYSTECTOMY    . COLONOSCOPY  08/06/2011   Procedure: COLONOSCOPY;  Surgeon: Rogene Houston, MD;  [facility];  Service: Endoscopy;  Laterality: N/A;  215  . COLONOSCOPY WITH PROPOFOL N/A 12/31/2012   Procedure: COLONOSCOPY WITH PROPOFOL;  Surgeon: Rogene Houston, MD;  [facility];  Service: Endoscopy;  Laterality: N/A;  in cecum at 0814, total withdrawal time 63mn  . COLONOSCOPY WITH PROPOFOL N/A 02/05/2018   Procedure: COLONOSCOPY WITH PROPOFOL;  Surgeon: RRogene Houston MD;  [facility];  Service: Endoscopy;  Laterality: N/A;  1:25  . CORONARY ANGIOPLASTY WITH STENT PLACEMENT    . ESOPHAGEAL DILATION N/A 06/07/2015   Procedure: ESOPHAGEAL DILATION;  Surgeon: NRogene Houston MD;  [facility];  Service: Endoscopy;  Laterality: N/A;  . ESOPHAGOGASTRODUODENOSCOPY N/A 06/07/2015   Procedure: ESOPHAGOGASTRODUODENOSCOPY (EGD);  Surgeon: NRogene Houston MD;  [facility];  Service: Endoscopy;  Laterality: N/A;  2:45 - moved to 1:00 - Ann to notify pt  . ESOPHAGOGASTRODUODENOSCOPY (EGD) WITH PROPOFOL N/A 12/31/2012   Procedure: ESOPHAGOGASTRODUODENOSCOPY (EGD) WITH PROPOFOL;  Surgeon: NRogene Houston MD;  [facility];  Service: Endoscopy;  Laterality: N/A;  GE junction 37,  . ESOPHAGOGASTRODUODENOSCOPY (EGD) WITH PROPOFOL N/A 07/01/2013   Procedure: ESOPHAGOGASTRODUODENOSCOPY (EGD) WITH PROPOFOL;  Surgeon: NRogene Houston MD;  [facility];  Service: Endoscopy;  Laterality: N/A;  950  . MALONEY DILATION N/A 12/31/2012   Procedure: MALONEY DILATION;  Surgeon: NRogene Houston MD;  [facility];  Service:  Endoscopy;  Laterality: N/A;  used a # 54,#56  . MALONEY DILATION N/A 07/01/2013   Procedure: MVenia MinksDILATION;  Surgeon: NRogene Houston MD;  [facility];  Service: Endoscopy;  Laterality: N/A;  54/58; no heme present  . POLYPECTOMY  02/05/2018   Procedure: POLYPECTOMY;  Surgeon: RRogene Houston MD;  [facility];  Service: Endoscopy;;  distal rectum (HS)   Past Medical History:  Diagnosis Date  . Anxiety and depression   . Arthritis   . CAD (coronary artery disease)    Stent placement circumflex coronary 2007, catheterization 2008 patent stents. Normal LV function  . Chest pain   . [place]estive heart failure) ([facility]   . COPD (chronic obstructive pulmonary disease) (HCC)    no home O2  . Depression   . Diabetes mellitus    Insulin dependent  . Di[place]uropathy ([facility]  severe on multiple medications  . Dyslipidemia   . GERD (gastroesophageal reflux disease)   . Headache(784.0)   . 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 Hypertension   . MI (myocardial infarction) ([place])    2007  . Obstructive sleep apnea    CPAP, setting ?2  . Palpitations   . Tachycardia    BP 119/71   Pulse 61   SpO2 94%   Opioid Risk Score:   Fall Risk Score:  `1  Depression screen PHQ 2/9  Depression screen [facility] 2/9 03/24/2019 07/16/2018 06/12/2017 04/14/2017 02/17/2017 01/14/2017 12/24/2016  Decreased Interest 1 1 3 3  0 3 0  Down, Depressed, Hopeless 1 1 3 3  0 3 0  PHQ - 2 Score 2 2 6 6  0 6 0  Altered sleeping - - 3 - - - -  Tired, decreased energy - - 3 - - - -  Change in appetite - - 0 - - - -  Feeling bad or failure about yourself  - - 3 - - - -  Trouble concentrating - - 3 - - - -  Moving slowly or fidgety/restless - - 1 - - - -  Suicidal thoughts - - 0 - - - -  PHQ-9 Score - - 19 - - - -  Difficult doing work/chores - - Very difficult - - - -  Some recent data might be hidden    Review of Systems  Constitutional: Positive for appetite change and unexpected weight change.   HENT: Negative.   Eyes: Negative.   Respiratory: Negative.   Gastrointestinal: Positive for abdominal pain, diarrhea, nausea and vomiting.  Endocrine:       High blood sugar  Genitourinary: Positive for difficulty urinating.  Musculoskeletal: Positive for arthralgias, back pain and gait problem.  Skin: Negative.   Allergic/Immunologic: Negative.   Neurological: Positive for headaches.  Psychiatric/Behavioral: Positive for confusion and dysphoric mood. The patient is nervous/anxious.        Objective:   Physical Exam Vitals and nursing note reviewed.  Constitutional:      Appearance: Normal appearance.  Neck:     Comments: Cervical Paraspinal Tenderness: C-5-C-6 Cardiovascular:     Rate and Rhythm: Normal rate and regular rhythm.     Pulses: Normal pulses.     Heart sounds: Normal heart sounds.  Pulmonary:     Effort: Pulmonary effort is normal.     Breath sounds: Normal breath sounds.  Musculoskeletal:     Cervical back: Normal range of motion and neck supple.     Comments: Normal Muscle Bulk and Muscle Testing Reveals:  Upper Extremities: Full ROM and Muscle Strength 5/5 Bilateral AC Joint Tenderness  Thoracic and Lumbar Hypersensitivity Bilateral Greater Trochanter Tenderness Lower Extremities: Decreased ROM and Muscle Strength 5/5 Arrived in Wheelchair  Skin:    General: Skin is warm and dry.  Neurological:     Mental Status: She is alert and oriented to person, place, and time.  Psychiatric:        Mood and Affect: Mood normal.        Behavior: Behavior normal.           Assessment & Plan:  1.Lumbar pain lumbar spondylosis: Encouraged to continue toincrease Activity as tolerated.08/31/2019. Continue current medication regimen.Refilled: MS Contin 30 MG one tablet every12hours #60. We will continue the opioid monitoring program, this consists of regular clinic visits, examinations, urine drug screen, pill counts as well as use of [place]  Controlled Substance Reporting System. 2.  Lumbar Radiculitis: Continuecurrent medication regimen withGabapentin.08/31/2019 3. Bilateral Knee Pain/ Degenerative:Continue Voltaren Gel. Continue to Monitor.08/31/2019 4. Severe diabetic poly neuropathy: Continuecurrent medication regimen withGabapentin.08/31/2019 5. Insomnia: ContinueTrazodone Psychiatry Following.08/31/2019 6.Anxiety/Depression:Lorazepam,ProzacandHydroxyzinePrescribed byPsychiatry.08/31/2019 7. De Quervains Tenosynovitis: No Complaints Today.08/31/2019 8.BilateralGreater Trochanteric Bursitis:Continue with Ice and Heat Therapy. Continue to Monitor.08/31/2019. 9. Cervicalgia/ Cervical Radiculitis:Continue Gabapentin.Continue with HEP as Tolerated and continue to monitor.08/31/2019 10. Chronic Bilateral Thoracic Back Pain: Continue HEP as Tolerated. Continue current medication regimen. Continue to monitor.08/31/2019.  F/U in 1 month  20 minutes of face to face patient care time was spent during this visit. All questions were encouraged and answered.

## 2019-09-15 ENCOUNTER — Other Ambulatory Visit: Payer: Self-pay | Admitting: Cardiovascular Disease

## 2019-09-26 ENCOUNTER — Encounter: Payer: Self-pay | Admitting: Registered Nurse

## 2019-09-26 ENCOUNTER — Encounter: Payer: Medicare Other | Attending: Registered Nurse | Admitting: Registered Nurse

## 2019-09-26 ENCOUNTER — Other Ambulatory Visit: Payer: Self-pay

## 2019-09-26 VITALS — BP 122/73 | HR 69 | Temp 98.9°F | Ht 64.0 in | Wt 219.8 lb

## 2019-09-26 DIAGNOSIS — M25562 Pain in left knee: Secondary | ICD-10-CM | POA: Insufficient documentation

## 2019-09-26 DIAGNOSIS — M7061 Trochanteric bursitis, right hip: Secondary | ICD-10-CM | POA: Diagnosis not present

## 2019-09-26 DIAGNOSIS — M5416 Radiculopathy, lumbar region: Secondary | ICD-10-CM

## 2019-09-26 DIAGNOSIS — G4733 Obstructive sleep apnea (adult) (pediatric): Secondary | ICD-10-CM | POA: Insufficient documentation

## 2019-09-26 DIAGNOSIS — I110 Hypertensive heart disease with heart failure: Secondary | ICD-10-CM | POA: Insufficient documentation

## 2019-09-26 DIAGNOSIS — E785 Hyperlipidemia, unspecified: Secondary | ICD-10-CM | POA: Insufficient documentation

## 2019-09-26 DIAGNOSIS — J449 Chronic obstructive pulmonary disease, unspecified: Secondary | ICD-10-CM | POA: Insufficient documentation

## 2019-09-26 DIAGNOSIS — E1142 Type 2 diabetes mellitus with diabetic polyneuropathy: Secondary | ICD-10-CM | POA: Insufficient documentation

## 2019-09-26 DIAGNOSIS — M542 Cervicalgia: Secondary | ICD-10-CM | POA: Diagnosis not present

## 2019-09-26 DIAGNOSIS — I2510 Atherosclerotic heart disease of native coronary artery without angina pectoris: Secondary | ICD-10-CM | POA: Insufficient documentation

## 2019-09-26 DIAGNOSIS — G4700 Insomnia, unspecified: Secondary | ICD-10-CM | POA: Insufficient documentation

## 2019-09-26 DIAGNOSIS — Z8249 Family history of ischemic heart disease and other diseases of the circulatory system: Secondary | ICD-10-CM | POA: Insufficient documentation

## 2019-09-26 DIAGNOSIS — M545 Low back pain: Secondary | ICD-10-CM | POA: Insufficient documentation

## 2019-09-26 DIAGNOSIS — Z955 Presence of coronary angioplasty implant and graft: Secondary | ICD-10-CM | POA: Diagnosis not present

## 2019-09-26 DIAGNOSIS — M25561 Pain in right knee: Secondary | ICD-10-CM | POA: Diagnosis not present

## 2019-09-26 DIAGNOSIS — F419 Anxiety disorder, unspecified: Secondary | ICD-10-CM | POA: Diagnosis not present

## 2019-09-26 DIAGNOSIS — M7062 Trochanteric bursitis, left hip: Secondary | ICD-10-CM | POA: Diagnosis not present

## 2019-09-26 DIAGNOSIS — M1711 Unilateral primary osteoarthritis, right knee: Secondary | ICD-10-CM

## 2019-09-26 DIAGNOSIS — Z5181 Encounter for therapeutic drug level monitoring: Secondary | ICD-10-CM | POA: Diagnosis present

## 2019-09-26 DIAGNOSIS — F329 Major depressive disorder, single episode, unspecified: Secondary | ICD-10-CM | POA: Insufficient documentation

## 2019-09-26 DIAGNOSIS — Z79891 Long term (current) use of opiate analgesic: Secondary | ICD-10-CM | POA: Diagnosis present

## 2019-09-26 DIAGNOSIS — G8929 Other chronic pain: Secondary | ICD-10-CM | POA: Insufficient documentation

## 2019-09-26 DIAGNOSIS — G894 Chronic pain syndrome: Secondary | ICD-10-CM | POA: Insufficient documentation

## 2019-09-26 DIAGNOSIS — K219 Gastro-esophageal reflux disease without esophagitis: Secondary | ICD-10-CM | POA: Diagnosis not present

## 2019-09-26 DIAGNOSIS — M4726 Other spondylosis with radiculopathy, lumbar region: Secondary | ICD-10-CM | POA: Insufficient documentation

## 2019-09-26 DIAGNOSIS — I252 Old myocardial infarction: Secondary | ICD-10-CM | POA: Diagnosis not present

## 2019-09-26 DIAGNOSIS — I509 Heart failure, unspecified: Secondary | ICD-10-CM | POA: Diagnosis not present

## 2019-09-26 DIAGNOSIS — M48062 Spinal stenosis, lumbar region with neurogenic claudication: Secondary | ICD-10-CM | POA: Diagnosis not present

## 2019-09-26 DIAGNOSIS — M546 Pain in thoracic spine: Secondary | ICD-10-CM | POA: Diagnosis not present

## 2019-09-26 DIAGNOSIS — M1712 Unilateral primary osteoarthritis, left knee: Secondary | ICD-10-CM

## 2019-09-26 DIAGNOSIS — I25118 Atherosclerotic heart disease of native coronary artery with other forms of angina pectoris: Secondary | ICD-10-CM

## 2019-09-26 DIAGNOSIS — M5412 Radiculopathy, cervical region: Secondary | ICD-10-CM | POA: Diagnosis not present

## 2019-09-26 DIAGNOSIS — Z823 Family history of stroke: Secondary | ICD-10-CM | POA: Insufficient documentation

## 2019-09-26 MED ORDER — MORPHINE SULFATE ER 30 MG PO TBCR: 30.0000 mg | EXTENDED_RELEASE_TABLET | Freq: Two times a day (BID) | ORAL | 0 refills | Status: DC

## 2019-09-26 NOTE — Progress Notes (Signed)
Subjective:    Patient ID: [name], female    DOB: [birth year], 69 y.o.   MRN: [id number]  HPI: [name] is a 69 y.o. female who returns for follow up appointment for chronic pain and medication refill. She states her pain is located in her neck radiating into her bilateral shoulders, upper- lower back pain radiating into her bilateral hips and bilateral lower extremities and bilateral feet with tingling and burning. Also reports bilateral knee pain. She  rates her pain 7. Her  current exercise regime is walking and performing stretching exercises.  [name] equivalent is 60.00  MME. She is also prescribed Lorazepam by Noemi Chapel. We have discussed the black box warning of using opioids and benzodiazepines. I highlighted the dangers of using these drugs together and discussed the adverse events including respiratory suppression, overdose, cognitive impairment and importance of compliance with current regimen. We will continue to monitor and adjust as indicated.  She  is being closely monitored and under the care of her psychiatrist Dr. Reece Levy.   Pain Inventory Average Pain 7 Pain Right Now 7 My pain is constant, sharp, burning, dull, stabbing and tingling  In the last 24 hours, has pain interfered with the following? General activity 10 Relation with others 10 Enjoyment of life 10 What TIME of day is your pain at its worst? night & morning Sleep (in general) Fair  Pain is worse with: walking, bending, sitting, inactivity and standing Pain improves with: medication Relief from Meds: 6  Mobility use a walker how many minutes can you walk? 2 mins ability to climb steps?  no do you drive?  no use a wheelchair Do you have any goals in this area?  yes  Function disabled: date disabled Do not remember when. Its been years. I need assistance with the following:  dressing, bathing, meal prep and household duties Do you have any goals in this area?   yes  Neuro/Psych bladder control problems weakness numbness tremor tingling trouble walking dizziness confusion depression anxiety loss of taste or smell  Prior Studies Any changes since last visit?  yes x-rays CT/MRI  Physicians involved in your care Any changes since last visit?  yes   Family History  Problem Relation Age of Onset  . Heart attack Mother   . Colon cancer Mother   . Heart attack Father   . Stroke Sister   . Hemophilia Child   . Cancer Other   . Coronary artery disease Other   . Cancer Brother   . Cancer Maternal Aunt   . Cancer Maternal Uncle   . Cancer Paternal Aunt   . Cancer Paternal Uncle   . Cancer Brother    Social History   Socioeconomic History  . Marital status: Legally Separated    Spouse name: Not on file  . Number of children: 4  . Years of education: Not on file  . Highest education level: Not on file  Occupational History  . Occupation: Disabled  Tobacco Use  . Smoking status: Never Smoker  . Smokeless tobacco: Never Used  Vaping Use  . Vaping Use: Never used  Substance and Sexual Activity  . Alcohol use: No    Alcohol/week: 0.0 standard drinks  . Drug use: No  . Sexual activity: Not on file  Other Topics Concern  . Not on file  Social History Narrative   Patient does not get regular exercise   Denies caffeine use    Social Determinants of  Health   Financial Resource Strain:   . Difficulty of Paying Living Expenses:   Food Insecurity:   . Worried About Charity fundraiser in the Last Year:   . Arboriculturist in the Last Year:   Transportation Needs:   . Film/video editor (Medical):   Marland Kitchen Lack of Transportation (Non-Medical):   Physical Activity:   . Days of Exercise per Week:   . Minutes of Exercise per Session:   Stress:   . Feeling of Stress :   Social Connections:   . Frequency of Communication with Friends and Family:   . Frequency of Social Gatherings with Friends and Family:   . Attends  Religious Services:   . Active Member of Clubs or Organizations:   . Attends Archivist Meetings:   Marland Kitchen Marital Status:    Past Surgical History:  Procedure Laterality Date  . ABDOMINAL HYSTERECTOMY    . Arm Surgery Bilateral    due to fall-pt broke both forearms  . BIOPSY N/A 07/01/2013   Procedure: BIOPSY;  Surgeon: Rogene Houston, MD;  Location: [facility];  Service: Endoscopy;  Laterality: N/A;  . CHOLECYSTECTOMY    . COLONOSCOPY  08/06/2011   Procedure: COLONOSCOPY;  Surgeon: Rogene Houston, MD;  [facility];  Service: Endoscopy;  Laterality: N/A;  215  . COLONOSCOPY WITH PROPOFOL N/A 12/31/2012   Procedure: COLONOSCOPY WITH PROPOFOL;  Surgeon: Rogene Houston, MD;  Location: [facility];  Service: Endoscopy;  Laterality: N/A;  in cecum at 0814, total withdrawal time 73mn  . COLONOSCOPY WITH PROPOFOL N/A 02/05/2018   Procedure: COLONOSCOPY WITH PROPOFOL;  Surgeon: RRogene Houston MD;  [facility];  Service: Endoscopy;  Laterality: N/A;  1:25  . CORONARY ANGIOPLASTY WITH STENT PLACEMENT    . ESOPHAGEAL DILATION N/A 06/07/2015   Procedure: ESOPHAGEAL DILATION;  Surgeon: NRogene Houston MD;  [facility];  Service: Endoscopy;  Laterality: N/A;  . ESOPHAGOGASTRODUODENOSCOPY N/A 06/07/2015   Procedure: ESOPHAGOGASTRODUODENOSCOPY (EGD);  Surgeon: NRogene Houston MD;  [facility];  Service: Endoscopy;  Laterality: N/A;  2:45 - moved to 1:00 - Ann to notify pt  . ESOPHAGOGASTRODUODENOSCOPY (EGD) WITH PROPOFOL N/A 12/31/2012   Procedure: ESOPHAGOGASTRODUODENOSCOPY (EGD) WITH PROPOFOL;  Surgeon: NRogene Houston MD;  Location: [facility];  Service: Endoscopy;  Laterality: N/A;  GE junction 37,  . ESOPHAGOGASTRODUODENOSCOPY (EGD) WITH PROPOFOL N/A 07/01/2013   Procedure: ESOPHAGOGASTRODUODENOSCOPY (EGD) WITH PROPOFOL;  Surgeon: NRogene Houston MD;  Location: [facility];  Service: Endoscopy;  Laterality: N/A;  950  . MALONEY DILATION N/A 12/31/2012    Procedure: MALONEY DILATION;  Surgeon: NRogene Houston MD;  Location: [facility];  Service: Endoscopy;  Laterality: N/A;  used a # 54,#56  . MALONEY DILATION N/A 07/01/2013   Procedure: MVenia MinksDILATION;  Surgeon: NRogene Houston MD;  Location: [facility];  Service: Endoscopy;  Laterality: N/A;  54/58; no heme present  . POLYPECTOMY  02/05/2018   Procedure: POLYPECTOMY;  Surgeon: RRogene Houston MD;  [facility];  Service: Endoscopy;;  distal rectum (HS)   Past Medical History:  Diagnosis Date  . Anxiety and depression   . Arthritis   . CAD (coronary artery disease)    Stent placement circumflex coronary 2007, catheterization 2008 patent stents. Normal LV function  . Chest pain   . CHF (congestive heart failure) ([facility]   . COPD (chronic obstructive pulmonary disease) ([facility]  no home O2  . Depression   . Diabetes mellitus    Insulin dependent  . Diabetic polyneuropathy (HCC)     severe on multiple medications  . Dyslipidemia   . GERD (gastroesophageal reflux disease)   . Headache(784.0)   . 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 Hypertension   . MI (myocardial infarction) ([place])    2007  . Obstructive sleep apnea    CPAP, setting ?2  . Palpitations   . Tachycardia    BP 122/73   Pulse 69   Temp 98.9 F (37.2 C)   Ht 5' 4"  (1.626 m)   Wt 219 lb 12.8 oz (99.7 kg)   SpO2 96%   BMI 37.73 kg/m   Opioid Risk Score:   Fall Risk Score:  `1  Depression screen PHQ 2/9  Depression screen [facility] 2/9 03/24/2019 07/16/2018 06/12/2017 04/14/2017 02/17/2017 01/14/2017 12/24/2016  Decreased Interest 1 1 3 3  0 3 0  Down, Depressed, Hopeless 1 1 3 3  0 3 0  PHQ - 2 Score 2 2 6 6  0 6 0  Altered sleeping - - 3 - - - -  Tired, decreased energy - - 3 - - - -  Change in appetite - - 0 - - - -  Feeling bad or failure about yourself  - - 3 - - - -  Trouble concentrating - - 3 - - - -  Moving slowly or fidgety/restless - - 1 - - - -  Suicidal thoughts - - 0 - - - -  PHQ-9 Score - - 19 - - - -  Difficult  doing work/chores - - Very difficult - - - -  Some recent data might be hidden    Review of Systems  Constitutional: Negative.   HENT: Negative.   Eyes: Negative.   Respiratory: Positive for shortness of breath.   Gastrointestinal: Positive for nausea and vomiting.  Endocrine: Negative.   Genitourinary: Positive for urgency.  Musculoskeletal: Positive for gait problem.  Skin: Negative.   Allergic/Immunologic: Negative.   Neurological: Positive for dizziness, tremors, weakness and numbness.  Psychiatric/Behavioral: Positive for confusion.       Depression, anxiety        Objective:   Physical Exam Vitals and nursing note reviewed.  Constitutional:      Appearance: Normal appearance.  Neck:     Comments: Cervical Paraspinal Tenderness: C-5-C-6  Cardiovascular:     Rate and Rhythm: Normal rate and regular rhythm.     Pulses: Normal pulses.     Heart sounds: Normal heart sounds.  Pulmonary:     Effort: Pulmonary effort is normal.     Breath sounds: Normal breath sounds.  Musculoskeletal:     Cervical back: Normal range of motion and neck supple.     Comments: Normal Muscle Bulk and Muscle Testing Reveals:  Upper Extremities: Full ROM and Muscle Strength 5/5 Bilateral AC Joint Tenderness  Thoracic and Lumbar Hypersensitivity Lower Extremities: Full ROM and Muscle Strength 5/5 Bilateral Lower Extremities Flexion Produces Pain into her Lumbar and Bilateral Lower Extremities Arrived in wheelchair   Skin:    General: Skin is warm and dry.  Neurological:     Mental Status: She is alert and oriented to person, place, and time.  Psychiatric:        Mood and Affect: Mood normal.        Behavior: Behavior normal.           Assessment & Plan:  1.Lumbar pain lumbar  spondylosis: Encouraged to continue toincrease Activity as tolerated.09/26/2019. Continue current medication regimen.Refilled: MS Contin 30 MG one tablet every12hours #60. We will continue the opioid  monitoring program, this consists of regular clinic visits, examinations, urine drug screen, pill counts as well as use of [place] Controlled Substance Reporting System. 2. Lumbar Radiculitis: Continuecurrent medication regimen withGabapentin.09/26/2019 3. Bilateral Knee Pain/ Degenerative:Continue Voltaren Gel. Continue to Monitor.09/26/2019 4. Severe diabetic poly neuropathy: Continuecurrent medication regimen withGabapentin.09/26/2019 5. Insomnia: ContinueTrazodone Psychiatry Following.09/26/2019 6.Anxiety/Depression:Lorazepam,ProzacandHydroxyzinePrescribed byPsychiatry.08/31/2019 7. De Quervains Tenosynovitis: No Complaints Today.09/26/2019 8.BilateralGreater Trochanteric Bursitis:Continue with Ice and Heat Therapy. Continue to Monitor.09/26/2019. 9. Cervicalgia/ Cervical Radiculitis:Continue Gabapentin.Continue with HEP as Tolerated and continue to monitor.09/26/2019 10. Chronic Bilateral Thoracic Back Pain: Continue HEP as Tolerated. Continue current medication regimen. Continue to monitor.09/26/2019.  F/U in 1 month  68mnutes of face to face patient care time was spent during this visit. All questions were encouraged and answered.

## 2019-10-03 ENCOUNTER — Ambulatory Visit (INDEPENDENT_AMBULATORY_CARE_PROVIDER_SITE_OTHER): Payer: Medicare Other | Admitting: Gastroenterology

## 2019-10-10 ENCOUNTER — Encounter: Payer: Self-pay | Admitting: Urology

## 2019-10-10 ENCOUNTER — Ambulatory Visit (INDEPENDENT_AMBULATORY_CARE_PROVIDER_SITE_OTHER): Payer: Medicare Other | Admitting: Urology

## 2019-10-10 ENCOUNTER — Other Ambulatory Visit: Payer: Self-pay

## 2019-10-10 ENCOUNTER — Ambulatory Visit (HOSPITAL_COMMUNITY)
Admission: RE | Admit: 2019-10-10 | Discharge: 2019-10-10 | Disposition: A | Discharge status: Home / Self Care | Payer: Medicare Other | Source: Ambulatory Visit | Attending: Urology | Admitting: Urology

## 2019-10-10 VITALS — BP 137/75 | HR 69 | Temp 98.7°F | Ht 64.0 in | Wt 220.0 lb

## 2019-10-10 DIAGNOSIS — N200 Calculus of kidney: Secondary | ICD-10-CM

## 2019-10-10 DIAGNOSIS — N12 Tubulo-interstitial nephritis, not specified as acute or chronic: Secondary | ICD-10-CM | POA: Diagnosis not present

## 2019-10-10 DIAGNOSIS — I25118 Atherosclerotic heart disease of native coronary artery with other forms of angina pectoris: Secondary | ICD-10-CM

## 2019-10-10 LAB — MICROSCOPIC EXAMINATION
RBC, Urine: NONE SEEN /hpf (ref 0–2)
Renal Epithel, UA: NONE SEEN /hpf
WBC, UA: >30 /hpf — AB (ref 0–5)

## 2019-10-10 LAB — URINALYSIS, ROUTINE W REFLEX MICROSCOPIC
Bilirubin, UA: NEGATIVE
Glucose, UA: NEGATIVE
Ketones, UA: NEGATIVE
Nitrite, UA: NEGATIVE
Specific Gravity, UA: 1.02 (ref 1.005–1.030)
Urobilinogen, Ur: 0.2 mg/dL (ref 0.2–1.0)
pH, UA: 7 (ref 5.0–7.5)

## 2019-10-10 MED ORDER — PROMETHAZINE HCL 25 MG PO TABS: 25.0000 mg | ORAL_TABLET | Freq: Four times a day (QID) | ORAL | 0 refills | Status: DC | PRN

## 2019-10-10 NOTE — Progress Notes (Signed)
Urological Symptom Review  Patient is experiencing the following symptoms: Frequent urination Hard to postpone urination Get up at night to urinate Leakage of urine Urinary tract infection Weak stream   Review of Systems  Gastrointestinal (upper)  : Nausea Vomiting Indigestion/heartburn  Gastrointestinal (lower) : Negative for lower GI symptoms  Constitutional : Negative for symptoms  Skin: Negative for skin symptoms  Eyes: Negative for eye symptoms  Ear/Nose/Throat : Negative for Ear/Nose/Throat symptoms  Hematologic/Lymphatic: Negative for Hematologic/Lymphatic symptoms  Cardiovascular : Leg swelling  Respiratory : Shortness of breath  Endocrine: Excessive thirst  Musculoskeletal: Back pain Joint pain  Neurological: Headaches  Psychologic: Depression Anxiety

## 2019-10-10 NOTE — Patient Instructions (Addendum)
Hospital will call with surgery date and time. Kidney Stones Kidney stones are rock-like masses that form inside of the kidneys. Kidneys are organs that make pee (urine). A kidney stone may move into other parts of the urinary tract, including:  The tubes that connect the kidneys to the bladder (ureters).  The bladder.  The tube that carries urine out of the body (urethra). Kidney stones can cause very bad pain and can block the flow of pee. The stone usually leaves your body (passes) through your pee. You may need to have a doctor take out the stone. What are the causes? Kidney stones may be caused by:  A condition in which certain glands make too much parathyroid hormone (primary hyperparathyroidism).  A buildup of a type of crystals in the bladder made of a chemical called uric acid. The body makes uric acid when you eat certain foods.  Narrowing (stricture) of one or both of the ureters.  A kidney blockage that you were born with.  Past surgery on the kidney or the ureters, such as gastric bypass surgery. What increases the risk? You are more likely to develop this condition if:  You have had a kidney stone in the past.  You have a family history of kidney stones.  You do not drink enough water.  You eat a diet that is high in protein, salt (sodium), or sugar.  You are overweight or very overweight (obese). What are the signs or symptoms? Symptoms of a kidney stone may include:  Pain in the side of the belly, right below the ribs (flank pain). Pain usually spreads (radiates) to the groin.  Needing to pee often or right away (urgently).  Pain when going pee (urinating).  Blood in your pee (hematuria).  Feeling like you may vomit (nauseous).  Vomiting.  Fever and chills. How is this treated? Treatment depends on the size, location, and makeup of the kidney stones. The stones will often pass out of the body through peeing. You may need to:  Drink more fluid to  help pass the stone. In some cases, you may be given fluids through an IV tube put into one of your veins at the hospital.  Take medicine for pain.  Make changes in your diet to help keep kidney stones from coming back. Sometimes, medical procedures are needed to remove a kidney stone. This may involve:  A procedure to break up kidney stones using a beam of light (laser) or shock waves.  Surgery to remove the kidney stones. Follow these instructions at home: Medicines  Take over-the-counter and prescription medicines only as told by your doctor.  Ask your doctor if the medicine prescribed to you requires you to avoid driving or using heavy machinery. Eating and drinking  Drink enough fluid to keep your pee pale yellow. You may be told to drink at least 8-10 glasses of water each day. This will help you pass the stone.  If told by your doctor, change your diet. This may include: ? Limiting how much salt you eat. ? Eating more fruits and vegetables. ? Limiting how much meat, poultry, fish, and eggs you eat.  Follow instructions from your doctor about eating or drinking restrictions. General instructions  Collect pee samples as told by your doctor. You may need to collect a pee sample: ? 24 hours after a stone comes out. ? 8-12 weeks after a stone comes out, and every 6-12 months after that.  Strain your pee every time you pee (  urinate), for as long as told. Use the strainer that your doctor recommends.  Do not throw out the stone. Keep it so that it can be tested by your doctor.  Keep all follow-up visits as told by your doctor. This is important. You may need follow-up tests. How is this prevented? To prevent another kidney stone:  Drink enough fluid to keep your pee pale yellow. This is the best way to prevent kidney stones.  Eat healthy foods.  Avoid certain foods as told by your doctor. You may be told to eat less protein.  Stay at a healthy weight. Where to find more  information  [place] ([facility]): www.kidney.[place] [facility]): www.urologyhealth.org Contact a doctor if:  You have pain that gets worse or does not get better with medicine. Get help right away if:  You have a fever or chills.  You get very bad pain.  You get new pain in your belly (abdomen).  You pass out (faint).  You cannot pee. Summary  Kidney stones are rock-like masses that form inside of the kidneys.  Kidney stones can cause very bad pain and can block the flow of pee.  The stones will often pass out of the body through peeing.  Drink enough fluid to keep your pee pale yellow. This information is not intended to replace advice given to you by your health care provider. Make sure you discuss any questions you have with your health care provider. Document Revised: 07/20/2018 Document Reviewed: 07/20/2018 Elsevier Patient Education  [place].

## 2019-10-10 NOTE — Progress Notes (Signed)
10/10/2019 10:40 AM   [name] [birth year] [id number]  Referring provider: Glenda Chroman, MD [place],  [place] [address]  Pyelonephritis  HPI: Ms Proano is [phone] here for evaluation of pyelonephritis. She was admitted with pyelonephritis on 08/22/2019. CT at that time showed left pyelonephritis and a right UPJ calculus. She has not passed her calculus. This is her first stone event. No family hx of nephrolithiasis. NO LUTS currently. She has dull intermittent, mild right flank pain. She has associated nausea but no vomiting. No fevers   PMH: Past Medical History:  Diagnosis Date  . Anxiety and depression   . Arthritis   . CAD (coronary artery disease)    Stent placement circumflex coronary 2007, catheterization 2008 patent stents. Normal LV function  . Chest pain   . CHF (congestive heart failure) ([facility])   . COPD (chronic obstructive pulmonary disease) ([facility])    no home O2  . Depression   . Diabetes mellitus    Insulin dependent  . Diabetic polyneuropathy ([facility])     severe on multiple medications  . Dyslipidemia   . GERD (gastroesophageal reflux disease)   . Headache(784.0)   . Heart attack ([facility])   . Heartburn   . 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 High cholesterol   . Hx of blood clots   . Hypertension   . MI (myocardial infarction) ([facility])    2007  . Obstructive sleep apnea    CPAP, setting ?2  . Palpitations   . Sleep apnea   . Tachycardia     Surgical History: Past Surgical History:  Procedure Laterality Date  . ABDOMINAL HYSTERECTOMY    . Arm Surgery Bilateral    due to fall-pt broke both forearms  . BIOPSY N/A 07/01/2013   Procedure: BIOPSY;  Surgeon: Rogene Houston, MD;  Location: [facility];  Service: Endoscopy;  Laterality: N/A;  . CHOLECYSTECTOMY    . COLONOSCOPY  08/06/2011   Procedure: COLONOSCOPY;  Surgeon: Rogene Houston, MD;  [facility];  Service: Endoscopy;  Laterality: N/A;  215  . COLONOSCOPY WITH PROPOFOL N/A 12/31/2012    Procedure: COLONOSCOPY WITH PROPOFOL;  Surgeon: Rogene Houston, MD;  Location: [facility];  Service: Endoscopy;  Laterality: N/A;  in cecum at 0814, total withdrawal time 83mn  . COLONOSCOPY WITH PROPOFOL N/A 02/05/2018   Procedure: COLONOSCOPY WITH PROPOFOL;  Surgeon: RRogene Houston MD;  [facility];  Service: Endoscopy;  Laterality: N/A;  1:25  . CORONARY ANGIOPLASTY WITH STENT PLACEMENT    . ESOPHAGEAL DILATION N/A 06/07/2015   Procedure: ESOPHAGEAL DILATION;  Surgeon: NRogene Houston MD;  [facility];  Service: Endoscopy;  Laterality: N/A;  . ESOPHAGOGASTRODUODENOSCOPY N/A 06/07/2015   Procedure: ESOPHAGOGASTRODUODENOSCOPY (EGD);  Surgeon: NRogene Houston MD;  [facility];  Service: Endoscopy;  Laterality: N/A;  2:45 - moved to 1:00 - Ann to notify pt  . ESOPHAGOGASTRODUODENOSCOPY (EGD) WITH PROPOFOL N/A 12/31/2012   Procedure: ESOPHAGOGASTRODUODENOSCOPY (EGD) WITH PROPOFOL;  Surgeon: NRogene Houston MD;  Location: [facility];  Service: Endoscopy;  Laterality: N/A;  GE junction 37,  . ESOPHAGOGASTRODUODENOSCOPY (EGD) WITH PROPOFOL N/A 07/01/2013   Procedure: ESOPHAGOGASTRODUODENOSCOPY (EGD) WITH PROPOFOL;  Surgeon: NRogene Houston MD;  Location: [facility];  Service: Endoscopy;  Laterality: N/A;  950  . MALONEY DILATION N/A 12/31/2012   Procedure: MALONEY DILATION;  Surgeon: NRogene Houston MD;  Location: [facility];  Service: Endoscopy;  Laterality: N/A;  used a # 54,#56  . MALONEY DILATION N/A 07/01/2013   Procedure: Venia Minks DILATION;  Surgeon: Rogene Houston, MD;  Location: [facility];  Service: Endoscopy;  Laterality: N/A;  54/58; no heme present  . POLYPECTOMY  02/05/2018   Procedure: POLYPECTOMY;  Surgeon: Rogene Houston, MD;  [facility];  Service: Endoscopy;;  distal rectum (HS)    Home Medications:  Allergies as of 10/10/2019      Reactions   Amphetamine-dextroamphetamine Swelling   Nitrofuran Derivatives Itching, Swelling    Amphetamine-dextroamphet Er Swelling   Pregabalin Swelling   Topiramate Other (See Comments)   Tongue tingle Tongue tingle Tongue tingle   Verelan [verapamil] Rash      Medication List       Accurate as of October 10, 2019 10:40 AM. If you have any questions, ask your nurse or doctor.        amLODipine-valsartan 10-160 MG tablet Commonly known as: EXFORGE Take 1 tablet by mouth daily.   Armodafinil 150 MG tablet Take 150 mg by mouth every morning.   aspirin EC 81 MG tablet Take 1 tablet (81 mg total) by mouth daily.   atorvastatin 40 MG tablet Commonly known as: LIPITOR Take 40 mg by mouth daily.   B-D ULTRAFINE III SHORT PEN 31G X 8 MM Misc Generic drug: Insulin Pen Needle Inject into the skin 4 (four) times daily.   buPROPion 150 MG 24 hr tablet Commonly known as: WELLBUTRIN XL Take 150-300 mg by mouth See admin instructions. Take 300 mg by mouth in the morning and take 150 mg by mouth at noon   buPROPion 100 MG 12 hr tablet Commonly known as: WELLBUTRIN SR Take 100 mg by mouth daily.   desvenlafaxine 100 MG 24 hr tablet Commonly known as: PRISTIQ Take by mouth.   diclofenac Sodium 1 % Gel Commonly known as: VOLTAREN APPLY TO BOTH KNEES THREE TIMES DAILY   doxepin 25 MG capsule Commonly known as: SINEQUAN TAKE ONE TO THREE CAPSULES 30 MINUTES BEFORE BEDTIME   estrogens (conjugated) 1.25 MG tablet Commonly known as: PREMARIN Take 1.25 mg by mouth daily.   FLUoxetine 20 MG capsule Commonly known as: PROZAC Take 20 mg by mouth 2 (two) times daily.   furosemide 20 MG tablet Commonly known as: LASIX Take 1 tablet by mouth daily as needed.   gabapentin 400 MG capsule Commonly known as: NEURONTIN Take 400 mg by mouth 3 (three) times daily.   glipiZIDE 5 MG 24 hr tablet Commonly known as: GLUCOTROL XL TAKE 1 TABLET BY MOUTH EVERY DAY WITH BREAKFAST   hydrOXYzine 10 MG tablet Commonly known as: ATARAX/VISTARIL Take 10 mg by mouth 4 (four) times daily  as needed for anxiety.   lidocaine 5 % ointment Commonly known as: XYLOCAINE Apply 1 application topically at bedtime.   lidocaine-hydrocortisone 3-0.5 % Crea Commonly known as: ANAMANTEL HC Place 1 Applicatorful rectally 2 (two) times daily for 7 days.   lisinopril 5 MG tablet Commonly known as: ZESTRIL TAKE 1 TABLET BY MOUTH DAILY   loperamide 2 MG capsule Commonly known as: IMODIUM   LORazepam 0.5 MG tablet Commonly known as: ATIVAN Take 0.5 mg by mouth 2 (two) times daily as needed for anxiety.   meloxicam 7.5 MG tablet Commonly known as: MOBIC Take 1 tablet (7.5 mg total) by mouth daily.   metFORMIN 500 MG tablet Commonly known as: GLUCOPHAGE TAKE 1 TABLET BY MOUTH TWICE DAILY   methocarbamol 500 MG tablet Commonly known as: ROBAXIN  Take 1 tablet (500 mg total) by mouth 3 (three) times daily. What changed:   when to take this  reasons to take this   metoCLOPramide 5 MG tablet Commonly known as: REGLAN Take 5 mg by mouth 3 (three) times daily as needed for nausea or vomiting.   metoprolol succinate 50 MG 24 hr tablet Commonly known as: TOPROL-XL TAKE 1 AND 1/2 TABLETS BY MOUTH EVERY DAY   montelukast 10 MG tablet Commonly known as: SINGULAIR Take by mouth.   morphine 30 MG 12 hr tablet Commonly known as: MS CONTIN Take 1 tablet (30 mg total) by mouth every 12 (twelve) hours.   nitroGLYCERIN 0.4 MG SL tablet Commonly known as: NITROSTAT Place 1 tablet (0.4 mg total) under the tongue every 5 (five) minutes x 3 doses as needed. For chest pains   ondansetron 4 MG tablet Commonly known as: ZOFRAN Take 4 mg by mouth every 6 (six) hours as needed for nausea or vomiting.   pantoprazole 40 MG tablet Commonly known as: PROTONIX TAKE 1 TABLET BY MOUTH TWICE DAILY BEFORE MEALS   phentermine 37.5 MG tablet Commonly known as: ADIPEX-P Take by mouth.   polyethylene glycol powder 17 GM/SCOOP powder Commonly known as: GLYCOLAX/MIRALAX MIX 1 CAPFUL OF POWDER  (17 GM) IN 8 OZ OF JUICE OR WATER AND DRINK BY MOUTH DAILY IN THE EVENING   potassium chloride 10 MEQ tablet Commonly known as: KLOR-CON Take 10 mEq by mouth 2 (two) times daily.   ProAir HFA 108 (90 Base) MCG/ACT inhaler Generic drug: albuterol Inhale 2 puffs into the lungs every 4 (four) hours as needed for wheezing or shortness of breath.   promethazine 25 MG tablet Commonly known as: PHENERGAN Take 1 tablet by mouth 4 (four) times daily as needed.   sucralfate 1 g tablet Commonly known as: Carafate Take 1 tablet (1 g total) by mouth 4 (four) times daily -  with meals and at bedtime.   topiramate 200 MG tablet Commonly known as: TOPAMAX Take 200 mg by mouth at bedtime.   Tyler Aas FlexTouch 100 UNIT/ML FlexTouch Pen Generic drug: insulin degludec INJECT 60 UNITS INTO THE SKIN AT BEDTIME   Vitamin D3 125 MCG (5000 UT) Caps TAKE ONE CAPSULE BY MOUTH DAILY   zolpidem 10 MG tablet Commonly known as: AMBIEN Take 10 mg by mouth at bedtime.       Allergies:  Allergies  Allergen Reactions  . Amphetamine-Dextroamphetamine Swelling  . Nitrofuran Derivatives Itching and Swelling  . Amphetamine-Dextroamphet Er Swelling  . Pregabalin Swelling  . Topiramate Other (See Comments)    Tongue tingle Tongue tingle Tongue tingle  . Verelan [Verapamil] Rash    Family History: Family History  Problem Relation Age of Onset  . Heart attack Mother   . Colon cancer Mother   . Heart attack Father   . Stroke Sister   . Hemophilia Child   . Cancer Other   . Coronary artery disease Other   . Cancer Brother   . Cancer Maternal Aunt   . Cancer Maternal Uncle   . Cancer Paternal Aunt   . Cancer Paternal Uncle   . Cancer Brother     Social History:  reports that she has never smoked. She has never used smokeless tobacco. She reports that she does not drink alcohol and does not use drugs.  ROS: All other review of systems were reviewed and are negative except what is noted above  in HPI  Physical Exam: BP (!) 137/75  Pulse 69   Temp 98.7 F (37.1 C)   Ht 5' 4"  (1.626 m)   Wt (!) 220 lb (99.8 kg)   BMI 37.76 kg/m   Constitutional:  Alert and oriented, No acute distress. HEENT: [place] AT, moist mucus membranes.  Trachea midline, no masses. Cardiovascular: No clubbing, cyanosis, or edema. Respiratory: Normal respiratory effort, no increased work of breathing. GI: Abdomen is soft, nontender, nondistended, no abdominal masses GU: No CVA tenderness.  Lymph: No cervical or inguinal lymphadenopathy. Skin: No rashes, bruises or suspicious lesions. Neurologic: Grossly intact, no focal deficits, moving all 4 extremities. Psychiatric: Normal mood and affect.  Laboratory Data: Lab Results  Component Value Date   WBC 7.8 12/07/2017   HGB 12.6 12/07/2017   HCT 39.6 12/07/2017   MCV 83.4 12/07/2017   PLT 369 12/07/2017    Lab Results  Component Value Date   CREATININE 0.70 11/02/2018    No results found for: PSA  No results found for: TESTOSTERONE  Lab Results  Component Value Date   HGBA1C 10.1 (H) 11/02/2018    Urinalysis    Component Value Date/Time   COLORURINE YELLOW 12/07/2017 1413   APPEARANCEUR HAZY (A) 12/07/2017 1413   LABSPEC 1.015 12/07/2017 1413   PHURINE 7.0 12/07/2017 1413   GLUCOSEU NEGATIVE 12/07/2017 1413   [place] 12/07/2017 [place] 12/07/2017 1413   KETONESUR NEGATIVE 12/07/2017 1413   PROTEINUR NEGATIVE 12/07/2017 1413   UROBILINOGEN 0.2 12/02/2012 1520   NITRITE NEGATIVE 12/07/2017 1413   LEUKOCYTESUR NEGATIVE 12/07/2017 1413    Lab Results  Component Value Date   LABMICR <3.0 04/15/2016    Pertinent Imaging: CT stone study 7/26: Images reviewed and discussed with the patient  No results found for this or any previous visit.  No results found for this or any previous visit.  No results found for this or any previous visit.  No results found for this or any previous visit.  No results  found for this or any previous visit.  No results found for this or any previous visit.  No results found for this or any previous visit.  No results found for this or any previous visit.   Assessment & Plan:    1. Pyelonephritis -resolved following antibiotic therapy - Urinalysis, Routine w reflex microscopic; Future  2. Nephrolithiasis -We discussed the management of kidney stones. These options include observation, ureteroscopy, shockwave lithotripsy (ESWL) and percutaneous nephrolithotomy (PCNL). We discussed which options are relevant to the patient's stone(s). We discussed the natural history of kidney stones as well as the complications of untreated stones and the impact on quality of life without treatment as well as with each of the above listed treatments. We also discussed the efficacy of each treatment in its ability to clear the stone burden. With any of these management options I discussed the signs and symptoms of infection and the need for emergent treatment should these be experienced. For each option we discussed the ability of each procedure to clear the patient of their stone burden.   For observation I described the risks which include but are not limited to silent renal damage, life-threatening infection, need for emergent surgery, failure to pass stone and pain.   For ureteroscopy I described the risks which include bleeding, infection, damage to contiguous structures, positioning injury, ureteral stricture, ureteral avulsion, ureteral injury, need for prolonged ureteral stent, inability to perform ureteroscopy, need for an interval procedure, inability to clear stone burden, stent discomfort/pain, heart attack, stroke,  pulmonary embolus and the inherent risks with general anesthesia.   For shockwave lithotripsy I described the risks which include arrhythmia, kidney contusion, kidney hemorrhage, need for transfusion, pain, inability to adequately break up stone, inability  to pass stone fragments, [place], infection associated with obstructing stones, need for alternate surgical procedure, need for repeat shockwave lithotripsy, MI, CVA, PE and the inherent risks with anesthesia/conscious sedation.   For PCNL I described the risks including positioning injury, pneumothorax, hydrothorax, need for chest tube, inability to clear stone burden, renal laceration, arterial venous fistula or malformation, need for embolization of kidney, loss of kidney or renal function, need for repeat procedure, need for prolonged nephrostomy tube, ureteral avulsion, MI, CVA, PE and the inherent risks of general anesthesia.   - The patient would like to proceed with right ureteroscopic stone extraction   No follow-ups on file.  Nicolette Bang, MD  [facility] [place]

## 2019-10-11 ENCOUNTER — Encounter: Payer: Self-pay | Admitting: Urology

## 2019-10-12 ENCOUNTER — Encounter: Payer: Self-pay | Admitting: Urology

## 2019-10-12 LAB — URINE CULTURE

## 2019-10-12 NOTE — Progress Notes (Signed)
Results sent via my chart 

## 2019-10-13 ENCOUNTER — Other Ambulatory Visit: Payer: Self-pay | Admitting: Physical Medicine & Rehabilitation

## 2019-10-25 NOTE — Patient Instructions (Signed)
Your procedure is scheduled on: 10/31/2019  Report to [facility] at  7:00  AM.  Call this number if you have problems the morning of surgery: [phone]   Remember:   Do not Eat or Drink after midnight         No Smoking the morning of surgery  :  Take these medicines the morning of surgery with A SIP OF WATER: Amlodipine-valsartan, armodafinil, pristiq, Prozac, metoprolol,              singulair, Pantoprazole and hydrozine if needed              No diabetic medication am of procedure and take only 1/2 dose of Tresiba 30 units the night before procedure   Do not wear jewelry, make-up or nail polish.  Do not wear lotions, powders, or perfumes. You may wear deodorant.  Do not shave 48 hours prior to surgery. Men may shave face and neck.  Do not bring valuables to the hospital.  Contacts, dentures or bridgework may not be worn into surgery.  Leave suitcase in the car. After surgery it may be brought to your room.  For patients admitted to the hospital, checkout time is 11:00 AM the day of discharge.   Patients discharged the day of surgery will not be allowed to drive home.    Special Instructions: Shower using CHG night before surgery and shower the day of surgery use CHG.  Use special wash - you have one bottle of CHG for all showers.  You should use approximately 1/2 of the bottle for each shower.  How to Use Chlorhexidine for Bathing Chlorhexidine gluconate (CHG) is a germ-killing (antiseptic) solution that is used to clean the skin. It can get rid of the bacteria that normally live on the skin and can keep them away for about 24 hours. To clean your skin with CHG, you may be given:  A CHG solution to use in the shower or as part of a sponge bath.  A prepackaged cloth that contains CHG. Cleaning your skin with CHG may help lower the risk for infection:  While you are staying in the intensive care unit of the hospital.  If you have a vascular access, such as a central line, to  provide short-term or long-term access to your veins.  If you have a catheter to drain urine from your bladder.  If you are on a ventilator. A ventilator is a machine that helps you breathe by moving air in and out of your lungs.  After surgery. What are the risks? Risks of using CHG include:  A skin reaction.  Hearing loss, if CHG gets in your ears.  Eye injury, if CHG gets in your eyes and is not rinsed out.  The CHG product catching fire. Make sure that you avoid smoking and flames after applying CHG to your skin. Do not use CHG:  If you have a chlorhexidine allergy or have previously reacted to chlorhexidine.  On babies younger than 66 months of age. How to use CHG solution  Use CHG only as told by your health care provider, and follow the instructions on the label.  Use the full amount of CHG as directed. Usually, this is one bottle. During a shower Follow these steps when using CHG solution during a shower (unless your health care provider gives you different instructions): 1. Start the shower. 2. Use your normal soap and shampoo to wash your face and hair. 3. Turn off the  shower or move out of the shower stream. 4. Pour the CHG onto a clean washcloth. Do not use any type of brush or rough-edged sponge. 5. Starting at your neck, lather your body down to your toes. Make sure you follow these instructions: ? If you will be having surgery, pay special attention to the part of your body where you will be having surgery. Scrub this area for at least 1 minute. ? Do not use CHG on your head or face. If the solution gets into your ears or eyes, rinse them well with water. ? Avoid your genital area. ? Avoid any areas of skin that have broken skin, cuts, or scrapes. ? Scrub your back and under your arms. Make sure to wash skin folds. 6. Let the lather sit on your skin for 1-2 minutes or as long as told by your health care provider. 7. Thoroughly rinse your entire body in the  shower. Make sure that all body creases and crevices are rinsed well. 8. Dry off with a clean towel. Do not put any substances on your body afterward--such as powder, lotion, or perfume--unless you are told to do so by your health care provider. Only use lotions that are recommended by the manufacturer. 9. Put on clean clothes or pajamas. 10. If it is the night before your surgery, sleep in clean sheets.  During a sponge bath Follow these steps when using CHG solution during a sponge bath (unless your health care provider gives you different instructions): 1. Use your normal soap and shampoo to wash your face and hair. 2. Pour the CHG onto a clean washcloth. 3. Starting at your neck, lather your body down to your toes. Make sure you follow these instructions: ? If you will be having surgery, pay special attention to the part of your body where you will be having surgery. Scrub this area for at least 1 minute. ? Do not use CHG on your head or face. If the solution gets into your ears or eyes, rinse them well with water. ? Avoid your genital area. ? Avoid any areas of skin that have broken skin, cuts, or scrapes. ? Scrub your back and under your arms. Make sure to wash skin folds. 4. Let the lather sit on your skin for 1-2 minutes or as long as told by your health care provider. 5. Using a different clean, wet washcloth, thoroughly rinse your entire body. Make sure that all body creases and crevices are rinsed well. 6. Dry off with a clean towel. Do not put any substances on your body afterward--such as powder, lotion, or perfume--unless you are told to do so by your health care provider. Only use lotions that are recommended by the manufacturer. 7. Put on clean clothes or pajamas. 8. If it is the night before your surgery, sleep in clean sheets. How to use CHG prepackaged cloths  Only use CHG cloths as told by your health care provider, and follow the instructions on the label.  Use the CHG  cloth on clean, dry skin.  Do not use the CHG cloth on your head or face unless your health care provider tells you to.  When washing with the CHG cloth: ? Avoid your genital area. ? Avoid any areas of skin that have broken skin, cuts, or scrapes. Before surgery Follow these steps when using a CHG cloth to clean before surgery (unless your health care provider gives you different instructions): 1. Using the CHG cloth, vigorously scrub the part  of your body where you will be having surgery. Scrub using a back-and-forth motion for 3 minutes. The area on your body should be completely wet with CHG when you are done scrubbing. 2. Do not rinse. Discard the cloth and let the area air-dry. Do not put any substances on the area afterward, such as powder, lotion, or perfume. 3. Put on clean clothes or pajamas. 4. If it is the night before your surgery, sleep in clean sheets.  For general bathing Follow these steps when using CHG cloths for general bathing (unless your health care provider gives you different instructions). 1. Use a separate CHG cloth for each area of your body. Make sure you wash between any folds of skin and between your fingers and toes. Wash your body in the following order, switching to a new cloth after each step: ? The front of your neck, shoulders, and chest. ? Both of your arms, under your arms, and your hands. ? Your stomach and groin area, avoiding the genitals. ? Your right leg and foot. ? Your left leg and foot. ? The back of your neck, your back, and your buttocks. 2. Do not rinse. Discard the cloth and let the area air-dry. Do not put any substances on your body afterward--such as powder, lotion, or perfume--unless you are told to do so by your health care provider. Only use lotions that are recommended by the manufacturer. 3. Put on clean clothes or pajamas. Contact a health care provider if:  Your skin gets irritated after scrubbing.  You have questions about  using your solution or cloth. Get help right away if:  Your eyes become very red or swollen.  Your eyes itch badly.  Your skin itches badly and is red or swollen.  Your hearing changes.  You have trouble seeing.  You have swelling or tingling in your mouth or throat.  You have trouble breathing.  You swallow any chlorhexidine. Summary  Chlorhexidine gluconate (CHG) is a germ-killing (antiseptic) solution that is used to clean the skin. Cleaning your skin with CHG may help to lower your risk for infection.  You may be given CHG to use for bathing. It may be in a bottle or in a prepackaged cloth to use on your skin. Carefully follow your health care provider's instructions and the instructions on the product label.  Do not use CHG if you have a chlorhexidine allergy.  Contact your health care provider if your skin gets irritated after scrubbing. This information is not intended to replace advice given to you by your health care provider. Make sure you discuss any questions you have with your health care provider. Document Revised: 05/20/2018 Document Reviewed: 01/29/2017 Elsevier Patient Education  [place].  Ureteroscopy Ureteroscopy is a procedure to check for and treat problems inside part of the urinary tract. In this procedure, a thin, tube-shaped instrument with a light at the end (ureteroscope) is used to look at the inside of the kidneys and the ureters, which are the tubes that carry urine from the kidneys to the bladder. The ureteroscope is inserted into one or both of the ureters. You may need this procedure if you have frequent urinary tract infections (UTIs), blood in your urine, or a stone in one of your ureters. A ureteroscopy can be done to find the cause of urine blockage in a ureter and to evaluate other abnormalities inside the ureters or kidneys. If stones are found, they can be removed during the procedure. Polyps, abnormal  tissue, and some types of  tumors can also be removed or treated. The ureteroscope may also have a tool to remove tissue to be checked for disease under a microscope (biopsy). Tell a health care provider about:  Any allergies you have.  All medicines you are taking, including vitamins, herbs, eye drops, creams, and over-the-counter medicines.  Any problems you or family members have had with anesthetic medicines.  Any blood disorders you have.  Any surgeries you have had.  Any medical conditions you have.  Whether you are pregnant or may be pregnant. What are the risks? Generally, this is a safe procedure. However, problems may occur, including:  Bleeding.  Infection.  Allergic reactions to medicines.  Scarring that narrows the ureter (stricture).  Creating a hole in the ureter (perforation). What happens before the procedure? Staying hydrated Follow instructions from your health care provider about hydration, which may include:  Up to 2 hours before the procedure - you may continue to drink clear liquids, such as water, clear fruit juice, black coffee, and plain tea. Eating and drinking restrictions Follow instructions from your health care provider about eating and drinking, which may include:  8 hours before the procedure - stop eating heavy meals or foods such as meat, fried foods, or fatty foods.  6 hours before the procedure - stop eating light meals or foods, such as toast or cereal.  6 hours before the procedure - stop drinking milk or drinks that contain milk.  2 hours before the procedure - stop drinking clear liquids. Medicines  Ask your health care provider about: ? Changing or stopping your regular medicines. This is especially important if you are taking diabetes medicines or blood thinners. ? Taking medicines such as aspirin and ibuprofen. These medicines can thin your blood. Do not take these medicines before your procedure if your health care provider instructs you not to.  You  may be given antibiotic medicine to help prevent infection. General instructions  You may have a urine sample taken to check for infection.  Plan to have someone take you home from the hospital or clinic. What happens during the procedure?   To reduce your risk of infection: ? Your health care team will wash or sanitize their hands. ? Your skin will be washed with soap.  An IV tube will be inserted into one of your veins.  You will be given one of the following: ? A medicine to help you relax (sedative). ? A medicine to make you fall asleep (general anesthetic). ? A medicine that is injected into your spine to numb the area below and slightly above the injection site (spinal anesthetic).  To lower your risk of infection, you may be given an antibiotic medicine by an injection or through the IV tube.  The opening from which you urinate (urethra) will be cleaned with a germ-killing solution.  The ureteroscope will be passed through your urethra into your bladder.  A salt-water solution will flow through the ureteroscope to fill your bladder. This will help the health care provider see the openings of your ureters more clearly.  Then, the ureteroscope will be passed into your ureter. ? If a growth is found, a piece of it may be removed so it can be examined under a microscope (biopsy). ? If a stone is found, it may be removed through the ureteroscope, or the stone may be broken up using a laser, shock waves, or electrical energy. ? In some cases, if the ureter  is too small, a tube may be inserted that keeps the ureter open (ureteral stent). The stent may be left in place for 1 or 2 weeks to keep the ureter open, and then the ureteroscopy procedure will be performed.  The scope will be removed, and your bladder will be emptied. The procedure may vary among health care providers and hospitals. What happens after the procedure?  Your blood pressure, heart rate, breathing rate, and  blood oxygen level will be monitored until the medicines you were given have worn off.  You may be asked to urinate.  Donot drive for 24 hours if you were given a sedative. This information is not intended to replace advice given to you by your health care provider. Make sure you discuss any questions you have with your health care provider. Document Revised: 02/13/2017 Document Reviewed: 12/14/2015 Elsevier Patient Education  [place].  Ureteral Stent Implantation, Care After This sheet gives you information about how to care for yourself after your procedure. Your health care provider may also give you more specific instructions. If you have problems or questions, contact your health care provider. What can I expect after the procedure? After the procedure, it is common to have:  Nausea.  Mild pain when you urinate. You may feel this pain in your lower back or lower abdomen. The pain should stop within a few minutes after you urinate. This may last for up to 1 week.  A small amount of blood in your urine for several days. Follow these instructions at home: Medicines  Take over-the-counter and prescription medicines only as told by your health care provider.  If you were prescribed an antibiotic medicine, take it as told by your health care provider. Do not stop taking the antibiotic even if you start to feel better.  Do not drive for 24 hours if you were given a sedative during your procedure.  Ask your health care provider if the medicine prescribed to you requires you to avoid driving or using heavy machinery. Activity  Rest as told by your health care provider.  Avoid sitting for a long time without moving. Get up to take short walks every 1-2 hours. This is important to improve blood flow and breathing. Ask for help if you feel weak or unsteady.  Return to your normal activities as told by your health care provider. Ask your health care provider what activities are  safe for you. General instructions   Watch for any blood in your urine. Call your health care provider if the amount of blood in your urine increases.  If you have a catheter: ? Follow instructions from your health care provider about taking care of your catheter and collection bag. ? Do not take baths, swim, or use a hot tub until your health care provider approves. Ask your health care provider if you may take showers. You may only be allowed to take sponge baths.  Drink enough fluid to keep your urine pale yellow.  Do not use any products that contain nicotine or tobacco, such as cigarettes, e-cigarettes, and chewing tobacco. These can delay healing after surgery. If you need help quitting, ask your health care provider.  Keep all follow-up visits as told by your health care provider. This is important. Contact a health care provider if:  You have pain that gets worse or does not get better with medicine, especially pain when you urinate.  You have difficulty urinating.  You feel nauseous or you vomit repeatedly  during a period of more than 2 days after the procedure. Get help right away if:  Your urine is dark red or has blood clots in it.  You are leaking urine (have incontinence).  The end of the stent comes out of your urethra.  You cannot urinate.  You have sudden, sharp, or severe pain in your abdomen or lower back.  You have a fever.  You have swelling or pain in your legs.  You have difficulty breathing. Summary  After the procedure, it is common to have mild pain when you urinate that goes away within a few minutes after you urinate. This may last for up to 1 week.  Watch for any blood in your urine. Call your health care provider if the amount of blood in your urine increases.  Take over-the-counter and prescription medicines only as told by your health care provider.  Drink enough fluid to keep your urine pale yellow. This information is not intended to  replace advice given to you by your health care provider. Make sure you discuss any questions you have with your health care provider. Document Revised: 12/08/2017 Document Reviewed: 12/09/2017 Elsevier Patient Education  2020 [place] Anesthesia, Adult, Care After This sheet gives you information about how to care for yourself after your procedure. Your health care provider may also give you more specific instructions. If you have problems or questions, contact your health care provider. What can I expect after the procedure? After the procedure, the following side effects are common:  Pain or discomfort at the IV site.  Nausea.  Vomiting.  Sore throat.  Trouble concentrating.  Feeling cold or chills.  Weak or tired.  Sleepiness and fatigue.  Soreness and body aches. These side effects can affect parts of the body that were not involved in surgery. Follow these instructions at home:  For at least 24 hours after the procedure:  Have a responsible adult stay with you. It is important to have someone help care for you until you are awake and alert.  Rest as needed.  Do not: ? Participate in activities in which you could fall or become injured. ? Drive. ? Use heavy machinery. ? Drink alcohol. ? Take sleeping pills or medicines that cause drowsiness. ? Make important decisions or sign legal documents. ? Take care of children on your own. Eating and drinking  Follow any instructions from your health care provider about eating or drinking restrictions.  When you feel hungry, start by eating small amounts of foods that are soft and easy to digest (bland), such as toast. Gradually return to your regular diet.  Drink enough fluid to keep your urine pale yellow.  If you vomit, rehydrate by drinking water, juice, or clear broth. General instructions  If you have sleep apnea, surgery and certain medicines can increase your risk for breathing problems. Follow  instructions from your health care provider about wearing your sleep device: ? Anytime you are sleeping, including during daytime naps. ? While taking prescription pain medicines, sleeping medicines, or medicines that make you drowsy.  Return to your normal activities as told by your health care provider. Ask your health care provider what activities are safe for you.  Take over-the-counter and prescription medicines only as told by your health care provider.  If you smoke, do not smoke without supervision.  Keep all follow-up visits as told by your health care provider. This is important. Contact a health care provider if:  You have nausea  or vomiting that does not get better with medicine.  You cannot eat or drink without vomiting.  You have pain that does not get better with medicine.  You are unable to pass urine.  You develop a skin rash.  You have a fever.  You have redness around your IV site that gets worse. Get help right away if:  You have difficulty breathing.  You have chest pain.  You have blood in your urine or stool, or you vomit blood. Summary  After the procedure, it is common to have a sore throat or nausea. It is also common to feel tired.  Have a responsible adult stay with you for the first 24 hours after general anesthesia. It is important to have someone help care for you until you are awake and alert.  When you feel hungry, start by eating small amounts of foods that are soft and easy to digest (bland), such as toast. Gradually return to your regular diet.  Drink enough fluid to keep your urine pale yellow.  Return to your normal activities as told by your health care provider. Ask your health care provider what activities are safe for you. This information is not intended to replace advice given to you by your health care provider. Make sure you discuss any questions you have with your health care provider. Document Revised: 03/06/2017 Document  Reviewed: 10/17/2016 Elsevier Patient Education  [place].

## 2019-10-26 ENCOUNTER — Encounter: Payer: Self-pay | Admitting: Registered Nurse

## 2019-10-26 ENCOUNTER — Encounter: Payer: Medicare Other | Attending: Registered Nurse | Admitting: Registered Nurse

## 2019-10-26 ENCOUNTER — Other Ambulatory Visit: Payer: Self-pay

## 2019-10-26 VITALS — BP 157/76 | HR 80 | Temp 98.6°F | Ht 64.0 in | Wt 224.0 lb

## 2019-10-26 DIAGNOSIS — K219 Gastro-esophageal reflux disease without esophagitis: Secondary | ICD-10-CM | POA: Insufficient documentation

## 2019-10-26 DIAGNOSIS — I252 Old myocardial infarction: Secondary | ICD-10-CM | POA: Insufficient documentation

## 2019-10-26 DIAGNOSIS — I509 Heart failure, unspecified: Secondary | ICD-10-CM | POA: Diagnosis not present

## 2019-10-26 DIAGNOSIS — I25118 Atherosclerotic heart disease of native coronary artery with other forms of angina pectoris: Secondary | ICD-10-CM

## 2019-10-26 DIAGNOSIS — M5412 Radiculopathy, cervical region: Secondary | ICD-10-CM

## 2019-10-26 DIAGNOSIS — M542 Cervicalgia: Secondary | ICD-10-CM | POA: Insufficient documentation

## 2019-10-26 DIAGNOSIS — G8929 Other chronic pain: Secondary | ICD-10-CM | POA: Insufficient documentation

## 2019-10-26 DIAGNOSIS — M48062 Spinal stenosis, lumbar region with neurogenic claudication: Secondary | ICD-10-CM | POA: Diagnosis not present

## 2019-10-26 DIAGNOSIS — M546 Pain in thoracic spine: Secondary | ICD-10-CM | POA: Diagnosis not present

## 2019-10-26 DIAGNOSIS — Z5181 Encounter for therapeutic drug level monitoring: Secondary | ICD-10-CM | POA: Diagnosis present

## 2019-10-26 DIAGNOSIS — Z955 Presence of coronary angioplasty implant and graft: Secondary | ICD-10-CM | POA: Insufficient documentation

## 2019-10-26 DIAGNOSIS — I110 Hypertensive heart disease with heart failure: Secondary | ICD-10-CM | POA: Insufficient documentation

## 2019-10-26 DIAGNOSIS — Z79891 Long term (current) use of opiate analgesic: Secondary | ICD-10-CM | POA: Diagnosis present

## 2019-10-26 DIAGNOSIS — M5416 Radiculopathy, lumbar region: Secondary | ICD-10-CM

## 2019-10-26 DIAGNOSIS — Z8249 Family history of ischemic heart disease and other diseases of the circulatory system: Secondary | ICD-10-CM | POA: Insufficient documentation

## 2019-10-26 DIAGNOSIS — E785 Hyperlipidemia, unspecified: Secondary | ICD-10-CM | POA: Diagnosis not present

## 2019-10-26 DIAGNOSIS — I2510 Atherosclerotic heart disease of native coronary artery without angina pectoris: Secondary | ICD-10-CM | POA: Diagnosis not present

## 2019-10-26 DIAGNOSIS — J449 Chronic obstructive pulmonary disease, unspecified: Secondary | ICD-10-CM | POA: Insufficient documentation

## 2019-10-26 DIAGNOSIS — E1142 Type 2 diabetes mellitus with diabetic polyneuropathy: Secondary | ICD-10-CM | POA: Diagnosis not present

## 2019-10-26 DIAGNOSIS — M1712 Unilateral primary osteoarthritis, left knee: Secondary | ICD-10-CM

## 2019-10-26 DIAGNOSIS — G4733 Obstructive sleep apnea (adult) (pediatric): Secondary | ICD-10-CM | POA: Insufficient documentation

## 2019-10-26 DIAGNOSIS — M25562 Pain in left knee: Secondary | ICD-10-CM | POA: Diagnosis not present

## 2019-10-26 DIAGNOSIS — G894 Chronic pain syndrome: Secondary | ICD-10-CM | POA: Insufficient documentation

## 2019-10-26 DIAGNOSIS — M1711 Unilateral primary osteoarthritis, right knee: Secondary | ICD-10-CM

## 2019-10-26 DIAGNOSIS — G4700 Insomnia, unspecified: Secondary | ICD-10-CM | POA: Diagnosis not present

## 2019-10-26 DIAGNOSIS — M4726 Other spondylosis with radiculopathy, lumbar region: Secondary | ICD-10-CM | POA: Diagnosis not present

## 2019-10-26 DIAGNOSIS — Z823 Family history of stroke: Secondary | ICD-10-CM | POA: Insufficient documentation

## 2019-10-26 DIAGNOSIS — M7061 Trochanteric bursitis, right hip: Secondary | ICD-10-CM | POA: Insufficient documentation

## 2019-10-26 DIAGNOSIS — M25561 Pain in right knee: Secondary | ICD-10-CM | POA: Insufficient documentation

## 2019-10-26 DIAGNOSIS — M25512 Pain in left shoulder: Secondary | ICD-10-CM

## 2019-10-26 DIAGNOSIS — F329 Major depressive disorder, single episode, unspecified: Secondary | ICD-10-CM | POA: Diagnosis not present

## 2019-10-26 DIAGNOSIS — M545 Low back pain: Secondary | ICD-10-CM | POA: Insufficient documentation

## 2019-10-26 DIAGNOSIS — M7062 Trochanteric bursitis, left hip: Secondary | ICD-10-CM | POA: Insufficient documentation

## 2019-10-26 DIAGNOSIS — F419 Anxiety disorder, unspecified: Secondary | ICD-10-CM | POA: Diagnosis not present

## 2019-10-26 MED ORDER — MORPHINE SULFATE ER 30 MG PO TBCR: 30.0000 mg | EXTENDED_RELEASE_TABLET | Freq: Two times a day (BID) | ORAL | 0 refills | Status: DC

## 2019-10-26 MED ORDER — MORPHINE SULFATE 15 MG PO TABS: 15.0000 mg | ORAL_TABLET | Freq: Every day | ORAL | 0 refills | Status: DC | PRN

## 2019-10-26 NOTE — Progress Notes (Signed)
Subjective:    Patient ID: [name], female    DOB: 1951/01/29, 69 y.o.   MRN: [id number]  HPI: [name] is a 69 y.o. female who returns for follow up appointment for chronic pain and medication refill. She states her pain is located in her neck radiating into her left shoulder, upper- lower back pain radiating into her bilateral hips and bilateral lower extremities. Also reports Left shoulder pain and denies falling, we will order X-Ray, she verbalizes understanding and states she has bilateral knee pain. She rates her pain 8. Her current exercise regime is walking.   [name] states her current analgesic only gives her 8-9 hours of relief with her pain and has experienced increase intensity of pain. We will prescribed MSIR for break through pain, she verbalizes understanding.   [name] Morphine equivalent is 60.00MME. She  is also prescribed Lorazepam by Noemi Chapel. We have discussed the black box warning of using opioids and benzodiazepines. I highlighted the dangers of using these drugs together and discussed the adverse events including respiratory suppression, overdose, cognitive impairment and importance of compliance with current regimen. We will continue to monitor and adjust as indicated.  she is being closely monitored and under the care of her psychiatrist Dr Reece Levy..  Last Oral Swab was Performed on 07/21/2019, it was inconsistent, see note for details.   Pain Inventory Average Pain 8 Pain Right Now 8 My pain is constant, sharp, burning, dull, stabbing, tingling and aching  In the last 24 hours, has pain interfered with the following? General activity 0 Relation with others 9 Enjoyment of life 1 What TIME of day is your pain at its worst? night & morning Sleep (in general) Poor  Pain is worse with: walking, bending and inactivity Pain improves with: rest Relief from Meds: 4  Mobility use a cane use a walker how many minutes can you walk? 3-4  mins ability to climb steps?  no do you drive?  no  Function disabled: date disabled Maybe 7 or more years retired I need assistance with the following:  dressing, bathing, toileting, meal prep, household duties and shopping Do you have any goals in this area?  yes  Neuro/Psych weakness numbness tremor tingling trouble walking spasms dizziness confusion depression anxiety loss of taste or smell  Prior Studies Any changes since last visit?  yes [place] involved in your care Any changes since last visit?  yes Urologist Nicolette Bang   Family History  Problem Relation Age of Onset  . Heart attack Mother   . Colon cancer Mother   . Heart attack Father   . Stroke Sister   . Hemophilia Child   . Cancer Other   . Coronary artery disease Other   . Cancer Brother   . Cancer Maternal Aunt   . Cancer Maternal Uncle   . Cancer Paternal Aunt   . Cancer Paternal Uncle   . Cancer Brother    Social History   Socioeconomic History  . Marital status: Divorced    Spouse name: Not on file  . Number of children: 4  . Years of education: Not on file  . Highest education level: Not on file  Occupational History  . Occupation: Disabled  Tobacco Use  . Smoking status: Never Smoker  . Smokeless tobacco: Never Used  Vaping Use  . Vaping Use: Never used  Substance and Sexual Activity  . Alcohol use: No    Alcohol/week: 0.0 standard drinks  .  Drug use: No  . Sexual activity: Not on file  Other Topics Concern  . Not on file  Social History Narrative   Patient does not get regular exercise   Denies caffeine use    Social Determinants of Health   Financial Resource Strain:   . Difficulty of Paying Living Expenses:   Food Insecurity:   . Worried About Charity fundraiser in the Last Year:   . Arboriculturist in the Last Year:   Transportation Needs:   . Film/video editor (Medical):   Marland Kitchen Lack of Transportation (Non-Medical):   Physical  Activity:   . Days of Exercise per Week:   . Minutes of Exercise per Session:   Stress:   . Feeling of Stress :   Social Connections:   . Frequency of Communication with Friends and Family:   . Frequency of Social Gatherings with Friends and Family:   . Attends Religious Services:   . Active Member of Clubs or Organizations:   . Attends Archivist Meetings:   Marland Kitchen Marital Status:    Past Surgical History:  Procedure Laterality Date  . ABDOMINAL HYSTERECTOMY    . Arm Surgery Bilateral    due to fall-pt broke both forearms  . BIOPSY N/A 07/01/2013   Procedure: BIOPSY;  Surgeon: Rogene Houston, MD;  Location: [facility];  Service: Endoscopy;  Laterality: N/A;  . CHOLECYSTECTOMY    . COLONOSCOPY  08/06/2011   Procedure: COLONOSCOPY;  Surgeon: Rogene Houston, MD;  [facility];  Service: Endoscopy;  Laterality: N/A;  215  . COLONOSCOPY WITH PROPOFOL N/A 12/31/2012   Procedure: COLONOSCOPY WITH PROPOFOL;  Surgeon: Rogene Houston, MD;  Location: [facility];  Service: Endoscopy;  Laterality: N/A;  in cecum at 0814, total withdrawal time 54mn  . COLONOSCOPY WITH PROPOFOL N/A 02/05/2018   Procedure: COLONOSCOPY WITH PROPOFOL;  Surgeon: RRogene Houston MD;  [facility];  Service: Endoscopy;  Laterality: N/A;  1:25  . CORONARY ANGIOPLASTY WITH STENT PLACEMENT    . ESOPHAGEAL DILATION N/A 06/07/2015   Procedure: ESOPHAGEAL DILATION;  Surgeon: NRogene Houston MD;  [facility];  Service: Endoscopy;  Laterality: N/A;  . ESOPHAGOGASTRODUODENOSCOPY N/A 06/07/2015   Procedure: ESOPHAGOGASTRODUODENOSCOPY (EGD);  Surgeon: NRogene Houston MD;  [facility];  Service: Endoscopy;  Laterality: N/A;  2:45 - moved to 1:00 - Ann to notify pt  . ESOPHAGOGASTRODUODENOSCOPY (EGD) WITH PROPOFOL N/A 12/31/2012   Procedure: ESOPHAGOGASTRODUODENOSCOPY (EGD) WITH PROPOFOL;  Surgeon: NRogene Houston MD;  Location: [facility];  Service: Endoscopy;  Laterality: N/A;  GE  junction 37,  . ESOPHAGOGASTRODUODENOSCOPY (EGD) WITH PROPOFOL N/A 07/01/2013   Procedure: ESOPHAGOGASTRODUODENOSCOPY (EGD) WITH PROPOFOL;  Surgeon: NRogene Houston MD;  Location: [facility];  Service: Endoscopy;  Laterality: N/A;  950  . MALONEY DILATION N/A 12/31/2012   Procedure: MALONEY DILATION;  Surgeon: NRogene Houston MD;  Location: [facility];  Service: Endoscopy;  Laterality: N/A;  used a # 54,#56  . MALONEY DILATION N/A 07/01/2013   Procedure: MVenia MinksDILATION;  Surgeon: NRogene Houston MD;  Location: [facility];  Service: Endoscopy;  Laterality: N/A;  54/58; no heme present  . POLYPECTOMY  02/05/2018   Procedure: POLYPECTOMY;  Surgeon: RRogene Houston MD;  [facility];  Service: Endoscopy;;  distal rectum (HS)   Past Medical History:  Diagnosis Date  . Anxiety and depression   . Arthritis   . CAD (  coronary artery disease)    Stent placement circumflex coronary 2007, catheterization 2008 patent stents. Normal LV function  . Chest pain   . CHF (congestive heart failure) ([place])   . COPD (chronic obstructive pulmonary disease) ([facility])    no home O2  . Depression   . Diabetes mellitus    Insulin dependent  . Diabetic polyneuropathy ([facility])     severe on multiple medications  . Dyslipidemia   . GERD (gastroesophageal reflux disease)   . Headache(784.0)   . Heart attack ([place])   . Heartburn   . 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 High cholesterol   . Hx of blood clots   . Hypertension   . MI (myocardial infarction) ([place])    2007  . Obstructive sleep apnea    CPAP, setting ?2  . Palpitations   . Sleep apnea   . Tachycardia    BP (!) 157/76   Pulse 80   Temp 98.6 F (37 C)   Ht 5' 4"  (1.626 m)   Wt 224 lb (101.6 kg)   SpO2 95%   BMI 38.45 kg/m   Opioid Risk Score:   Fall Risk Score:  `1  Depression screen PHQ 2/9  Depression screen [facility] 2/9 09/26/2019 03/24/2019 07/16/2018 06/12/2017 04/14/2017 02/17/2017 01/14/2017  Decreased Interest 0 1 1 3 3  0 3  Down, Depressed, Hopeless 0  1 1 3 3  0 3  PHQ - 2 Score 0 2 2 6 6  0 6  Altered sleeping - - - 3 - - -  Tired, decreased energy - - - 3 - - -  Change in appetite - - - 0 - - -  Feeling bad or failure about yourself  - - - 3 - - -  Trouble concentrating - - - 3 - - -  Moving slowly or fidgety/restless - - - 1 - - -  Suicidal thoughts - - - 0 - - -  PHQ-9 Score - - - 19 - - -  Difficult doing work/chores - - - Very difficult - - -  Some recent data might be hidden   Review of Systems  Constitutional: Negative.   HENT: Negative.   Eyes: Negative.   Respiratory: Negative.   Cardiovascular: Positive for leg swelling.  Gastrointestinal: Negative.   Endocrine: Negative.   Genitourinary: Negative.   Musculoskeletal: Positive for back pain and gait problem.  Skin: Negative.   Allergic/Immunologic: Negative.   Neurological: Positive for weakness and numbness.  Psychiatric/Behavioral:       Anxiety, depression  All other systems reviewed and are negative.      Objective:   Physical Exam Vitals and nursing note reviewed.  Constitutional:      Appearance: Normal appearance.  Neck:     Comments: Cervical Paraspinal Tenderness: C-5-C-6 Cardiovascular:     Rate and Rhythm: Normal rate and regular rhythm.     Pulses: Normal pulses.     Heart sounds: Normal heart sounds.  Pulmonary:     Effort: Pulmonary effort is normal.     Breath sounds: Normal breath sounds.  Musculoskeletal:     Cervical back: Normal range of motion and neck supple.     Comments: Normal Muscle Bulk and Muscle Testing Reveals:  Upper Extremities: Right: Full ROM and Muscle Strength 5/5 Left Upper Extremity: Decreased ROM 30 Degrees and Muscle Strength 4/5 Left AC Joint Tenderness  Thoracic and Lumbar Hypersensitivity Lower Extremities: Decreasedl ROM and Muscle Strength 5/5  Bilateral Lower extremities Flexion Produces Pain  into her Lumbar, Right Hip and Bilateral Lower Extremities Arises from Table Slowly Antalgic Gait   Skin:     General: Skin is warm and dry.  Neurological:     Mental Status: She is alert and oriented to person, place, and time.  Psychiatric:        Mood and Affect: Mood normal.        Behavior: Behavior normal.           Assessment & Plan:  1.Lumbar pain lumbar spondylosis: Encouraged to continue toincrease Activity as tolerated.10/26/2019. Continue current medication regimen. RX: MSIR 15 mg one tablet daily as needed for breakthrough pain.Refilled: MS Contin 30 MG one tablet every12hours #60. We will continue the opioid monitoring program, this consists of regular clinic visits, examinations, urine drug screen, pill counts as well as use of [place] Controlled Substance Reporting System. 2. Lumbar Radiculitis: Continuecurrent medication regimen withGabapentin.10/26/2019 3. Bilateral Knee Pain/ Degenerative:Continue Voltaren Gel. Continue to Monitor.10/26/2019 4. Severe diabetic poly neuropathy: Continuecurrent medication regimen withGabapentin.10/26/2019 5. Insomnia: ContinueTrazodone Psychiatry Following.10/26/2019 6.Anxiety/Depression:Lorazepam,ProzacandHydroxyzinePrescribed byPsychiatry.10/26/2019 7. De Quervains Tenosynovitis: No Complaints Today.10/26/2019 8.BilateralGreater Trochanteric Bursitis:Continue with Ice and Heat Therapy. Continue to Monitor.10/26/2019. 9. Cervicalgia/ Cervical Radiculitis:Continue Gabapentin.Continue with HEP as Tolerated and continue to monitor.10/26/2019 10. Chronic Bilateral Thoracic Back Pain: Continue HEP as Tolerated. Continue current medication regimen. Continue to monitor.10/26/2019. 11. Acute Left Shoulder Pain: RX: Xray.    F/U in 1 month  76mnutes of face to face patient care time was spent during this visit. All questions were encouraged and answered.

## 2019-10-26 NOTE — Patient Instructions (Signed)
Send a My-Chart message in one week to evaluate medication change.

## 2019-10-27 ENCOUNTER — Encounter (HOSPITAL_COMMUNITY)
Admission: RE | Admit: 2019-10-27 | Discharge: 2019-10-27 | Disposition: A | Discharge status: Home / Self Care | Payer: Medicare Other | Source: Ambulatory Visit | Attending: Urology | Admitting: Urology

## 2019-10-27 ENCOUNTER — Telehealth: Payer: Self-pay | Admitting: *Deleted

## 2019-10-27 ENCOUNTER — Other Ambulatory Visit (HOSPITAL_COMMUNITY)
Admission: RE | Admit: 2019-10-27 | Discharge: 2019-10-27 | Disposition: A | Discharge status: Home / Self Care | Payer: Medicare Other | Source: Ambulatory Visit | Attending: Urology | Admitting: Urology

## 2019-10-27 ENCOUNTER — Encounter: Payer: Self-pay | Admitting: Registered Nurse

## 2019-10-27 DIAGNOSIS — Z01818 Encounter for other preprocedural examination: Secondary | ICD-10-CM | POA: Insufficient documentation

## 2019-10-27 DIAGNOSIS — Z20822 Contact with and (suspected) exposure to covid-19: Secondary | ICD-10-CM | POA: Insufficient documentation

## 2019-10-27 LAB — BASIC METABOLIC PANEL
Anion gap: 13 (ref 5–15)
BUN: 16 mg/dL (ref 8–23)
CO2: 20 mmol/L — ABNORMAL LOW (ref 22–32)
Calcium: 8.9 mg/dL (ref 8.9–10.3)
Chloride: 107 mmol/L (ref 98–111)
Creatinine, Ser: 0.66 mg/dL (ref 0.44–1.00)
GFR calc Af Amer: >60 mL/min (ref >60)
GFR calc non Af Amer: >60 mL/min (ref >60)
Glucose, Bld: 213 mg/dL — ABNORMAL HIGH (ref 70–99)
Potassium: 4.3 mmol/L (ref 3.5–5.1)
Sodium: 140 mmol/L (ref 135–145)

## 2019-10-27 LAB — SARS CORONAVIRUS 2 (TAT 6-24 HRS): SARS Coronavirus 2: NEGATIVE

## 2019-10-27 LAB — HEMOGLOBIN A1C
Hgb A1c MFr Bld: 9.7 % — ABNORMAL HIGH (ref 4.8–5.6)
Mean Plasma Glucose: 231.69 mg/dL

## 2019-10-27 NOTE — Telephone Encounter (Signed)
Unable to get through on the numbers listed and unable to leave a message- "mailbox full"

## 2019-10-27 NOTE — Pre-Procedure Instructions (Signed)
Interoffice message sent to [facility] to see if cardiac clearance is needed for surgery.

## 2019-10-27 NOTE — Telephone Encounter (Signed)
   [facility] Pre-operative Risk Assessment    HEARTCARE STAFF: - Please ensure there is not already an duplicate clearance open for this procedure. - Under Visit Info/Reason for Call, type in Other and utilize the format Clearance MM/DD/YY or Clearance TBD. Do not use dashes or single digits. - If request is for dental extraction, please clarify the # of teeth to be extracted.  Request for surgical clearance:  1. What type of surgery is being performed? CYSTOSCOPY w/RIGHT RETROGRADE URETEROSCOPY w/LASER w/STENT PLACEMENT   2. When is this surgery scheduled? 10/31/19   3. What type of clearance is required (medical clearance vs. Pharmacy clearance to hold med vs. Both)? MEDICAL  4. Are there any medications that need to be held prior to surgery and how long? ASA   5. Practice name and name of physician performing surgery? [facility]   6. What is the office phone number? [phone]   7.   What is the office fax number? [phone]  8.   Anesthesia type (None, local, MAC, general) ? GENERAL   Julaine Hua 10/27/2019, 1:58 PM  _________________________________________________________________   (provider comments below)

## 2019-10-27 NOTE — Progress Notes (Deleted)
Cardiology Office Note  Date: 10/27/2019   ID: [name], DOB [birth year], MRN [id number]  PCP:  Glenda Chroman, MD  Cardiologist:  Kate Sable, MD (Inactive) Electrophysiologist:  None   Chief Complaint: Needs Pre-op clearance for surgery  History of Present Illness: [name] is a 69 y.o. female with a history of CAD (MI 2017 PCI of the circumflex), history of IDDM, OSA, IBS, GERD, chronic diastolic heart failure, tachycardia, palpitations, dyslipidemia.  Had Covid in January 2021 and hospitalized for 1 month at [facility].  Was in rehab and discharged to home on 06/18/2019.  At last visit with Dr. Bronson Ing via telemedicine she was ambulating with a walker and slowly regaining her energy.  Her coronary artery disease was stable.  Normal nuclear stress test June 2018.  She was continuing aspirin, atorvastatin and metoprolol succinate.  Her chronic diastolic heart failure was stable.  She was euvolemic and no longer on Lasix.  Had grade 2 DD on echo 04/05/2019 [facility].   She is scheduled to undergo a cystoscopy with right retrograde ureteroscopy w/ laser w/ stent placement on Monday 10/31/2019. She was complaining of CP described as "not bad" and had not used NTG. This was new for her. Kerin Ransom PA-C recommended appointment in [place] office for cardiac clearance.   Past Medical History:  Diagnosis Date  . Anxiety and depression   . Arthritis   . CAD (coronary artery disease)    Stent placement circumflex coronary 2007, catheterization 2008 patent stents. Normal LV function  . Chest pain   . CHF (congestive heart failure) ([place])   . COPD (chronic obstructive pulmonary disease) (HCC)    no home O2  . Depression   . Diabetes mellitus    Insulin dependent  . Diabetic polyneuropathy (HCC)     severe on multiple medications  . Dyslipidemia   . GERD (gastroesophageal reflux disease)   . Headache(784.0)   . Heart attack ([place])   . Heartburn   . 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 High cholesterol   . Hx of blood clots   . Hypertension   . MI (myocardial infarction) ([place])    2007  . Obstructive sleep apnea    CPAP, setting ?2  . Palpitations   . Sleep apnea   . Tachycardia     Past Surgical History:  Procedure Laterality Date  . ABDOMINAL HYSTERECTOMY    . Arm Surgery Bilateral    due to fall-pt broke both forearms  . BIOPSY N/A 07/01/2013   Procedure: BIOPSY;  Surgeon: Rogene Houston, MD;  Location: [facility] ORS;  Service: Endoscopy;  Laterality: N/A;  . CHOLECYSTECTOMY    . COLONOSCOPY  08/06/2011   Procedure: COLONOSCOPY;  Surgeon: Rogene Houston, MD;  Location: [facility] SU[facility];  Service: Endoscopy;  Laterality: N/A;  215  . COLONOSCOPY WITH PROPOFOL N/A 12/31/2012   Procedure: COLONOSCOPY WITH PROPOFOL;  Surgeon: Rogene Houston, MD;  Location: [facility]S;  Service: Endoscopy;  Laterality: N/A;  in cecum at 0814, total withdrawal time 69mn  . COLONOSCOPY WITH PROPOFOL N/A 02/05/2018   Procedure: COLONOSCOPY WITH PROPOFOL;  Surgeon: RRogene Houston MD;  Location: [facility];  Service: Endoscopy;  Laterality: N/A;  1:25  . CORONARY ANGIOPLASTY WITH STENT PLACEMENT    . ESOPHAGEAL DILATION N/A 06/07/2015   Procedure: ESOPHAGEAL DILATION;  Surgeon: NRogene Houston MD;  [facility]: [facility];  Service: Endoscopy;  Laterality: N/A;  . ESOPHAGOGASTRODUODENOSCOPY  N/A 06/07/2015   Procedure: ESOPHAGOGASTRODUODENOSCOPY (EGD);  Surgeon: Rogene Houston, MD;  [facility]: [facility];  Service: Endoscopy;  Laterality: N/A;  2:45 - moved to 1:00 - Ann to notify pt  . ESOPHAGOGASTRODUODENOSCOPY (EGD) WITH PROPOFOL N/A 12/31/2012   Procedure: ESOPHAGOGASTRODUODENOSCOPY (EGD) WITH PROPOFOL;  Surgeon: Rogene Houston, MD;  Location: [facility];  Service: Endoscopy;  Laterality: N/A;  GE junction 37,  . ESOPHAGOGASTRODUODENOSCOPY (EGD) WITH PROPOFOL N/A 07/01/2013   Procedure: ESOPHAGOGASTRODUODENOSCOPY (EGD) WITH PROPOFOL;  Surgeon: Rogene Houston, MD;  Location: [facility]  [facility];  Service: Endoscopy;  Laterality: N/A;  950  . MALONEY DILATION N/A 12/31/2012   Procedure: MALONEY DILATION;  Surgeon: Rogene Houston, MD;  Location: [facility];  Service: Endoscopy;  Laterality: N/A;  used a # 54,#56  . MALONEY DILATION N/A 07/01/2013   Procedure: Venia Minks DILATION;  Surgeon: Rogene Houston, MD;  Location: [facility];  Service: Endoscopy;  Laterality: N/A;  54/58; no heme present  . POLYPECTOMY  02/05/2018   Procedure: POLYPECTOMY;  Surgeon: Rogene Houston, MD;  [facility];  Service: Endoscopy;;  distal rectum (HS)    Current Outpatient Medications  Medication Sig Dispense Refill  . amLODipine-valsartan (EXFORGE) 10-160 MG tablet Take 1 tablet by mouth daily.    . Armodafinil 150 MG tablet Take 150 mg by mouth daily.     Marland Kitchen aspirin EC 81 MG tablet Take 1 tablet (81 mg total) by mouth daily.    Marland Kitchen atorvastatin (LIPITOR) 40 MG tablet Take 40 mg by mouth daily.    . B-D ULTRAFINE III SHORT PEN 31G X 8 MM MISC Inject into the skin 4 (four) times daily.    Marland Kitchen buPROPion (WELLBUTRIN SR) 100 MG 12 hr tablet Take 100 mg by mouth daily.    Marland Kitchen buPROPion (WELLBUTRIN XL) 150 MG 24 hr tablet Take 150-300 mg by mouth See admin instructions. Take 300 mg by mouth in the morning and take 150 mg by mouth at noon    . Cholecalciferol (VITAMIN D3) 125 MCG (5000 UT) CAPS TAKE ONE CAPSULE BY MOUTH DAILY 90 capsule 0  . desvenlafaxine (PRISTIQ) 100 MG 24 hr tablet Take by mouth.    . diclofenac Sodium (VOLTAREN) 1 % GEL APPLY TO BOTH KNEES THREE TIMES DAILY (Patient taking differently: Apply 1 application topically 3 (three) times daily as needed (knee pain.). ) 300 g 2  . doxepin (SINEQUAN) 25 MG capsule TAKE ONE TO THREE CAPSULES 30 MINUTES BEFORE BEDTIME    . estradiol (ESTRACE) 0.5 MG tablet Take 0.5 mg by mouth daily.     Marland Kitchen FLUoxetine (PROZAC) 20 MG capsule Take 20 mg by mouth 2 (two) times daily.   1  . furosemide (LASIX) 20 MG tablet Take 1 tablet by mouth daily as needed.    .  gabapentin (NEURONTIN) 400 MG capsule Take 400 mg by mouth 3 (three) times daily.  5  . glipiZIDE (GLUCOTROL XL) 5 MG 24 hr tablet TAKE 1 TABLET BY MOUTH EVERY DAY WITH BREAKFAST 30 tablet 3  . hydrOXYzine (ATARAX/VISTARIL) 10 MG tablet Take 10 mg by mouth 4 (four) times daily as needed for anxiety.    . lidocaine (XYLOCAINE) 5 % ointment Apply 1 application topically at bedtime.     . lidocaine-hydrocortisone (ANAMANTEL HC) 3-0.5 % CREA Place 1 Applicatorful rectally 2 (two) times daily for 7 days. 14 g 0  . lisinopril (ZESTRIL) 5 MG tablet TAKE 1 TABLET BY MOUTH DAILY (Patient taking differently: Take  5 mg by mouth daily. ) 30 tablet 6  . loperamide (IMODIUM) 2 MG capsule     . LORazepam (ATIVAN) 0.5 MG tablet Take 0.5 mg by mouth 2 (two) times daily as needed for anxiety.   1  . meloxicam (MOBIC) 7.5 MG tablet Take 1 tablet (7.5 mg total) by mouth daily.  4  . metFORMIN (GLUCOPHAGE) 500 MG tablet TAKE 1 TABLET BY MOUTH TWICE DAILY 60 tablet 3  . methocarbamol (ROBAXIN) 500 MG tablet Take 1 tablet (500 mg total) by mouth 3 (three) times daily. (Patient taking differently: Take 500 mg by mouth 3 (three) times daily as needed for muscle spasms. ) 45 tablet 0  . metoCLOPramide (REGLAN) 5 MG tablet Take 5 mg by mouth 3 (three) times daily as needed for nausea or vomiting.   1  . metoprolol succinate (TOPROL-XL) 50 MG 24 hr tablet TAKE 1 AND 1/2 TABLETS BY MOUTH EVERY DAY 135 tablet 1  . montelukast (SINGULAIR) 10 MG tablet Take by mouth.    . morphine (MS CONTIN) 30 MG 12 hr tablet Take 1 tablet (30 mg total) by mouth every 12 (twelve) hours. 60 tablet 0  . morphine (MSIR) 15 MG tablet Take 1 tablet (15 mg total) by mouth daily as needed for severe pain. 30 tablet 0  . nitroGLYCERIN (NITROSTAT) 0.4 MG SL tablet Place 1 tablet (0.4 mg total) under the tongue every 5 (five) minutes x 3 doses as needed. For chest pains 25 tablet 3  . ondansetron (ZOFRAN) 4 MG tablet Take 4 mg by mouth every 6 (six) hours  as needed for nausea or vomiting.    . pantoprazole (PROTONIX) 40 MG tablet TAKE 1 TABLET BY MOUTH TWICE DAILY BEFORE MEALS 60 tablet 1  . phentermine (ADIPEX-P) 37.5 MG tablet Take by mouth.    . polyethylene glycol powder (GLYCOLAX/MIRALAX) 17 GM/SCOOP powder MIX 1 CAPFUL OF POWDER (17 GM) IN 8 OZ OF JUICE OR WATER AND DRINK BY MOUTH DAILY IN THE EVENING    . potassium chloride (K-DUR,KLOR-CON) 10 MEQ tablet Take 10 mEq by mouth 2 (two) times daily.  12  . PROAIR HFA 108 (90 Base) MCG/ACT inhaler Inhale 2 puffs into the lungs every 4 (four) hours as needed for wheezing or shortness of breath.   8  . promethazine (PHENERGAN) 25 MG tablet Take 1 tablet (25 mg total) by mouth 4 (four) times daily as needed. 30 tablet 0  . sucralfate (CARAFATE) 1 g tablet Take 1 tablet (1 g total) by mouth 4 (four) times daily -  with meals and at bedtime. 120 tablet 3  . topiramate (TOPAMAX) 200 MG tablet Take 200 mg by mouth at bedtime.  3  . TRESIBA FLEXTOUCH 100 UNIT/ML SOPN FlexTouch Pen INJECT 60 UNITS INTO THE SKIN AT BEDTIME 30 mL 0  . zolpidem (AMBIEN) 10 MG tablet Take 10 mg by mouth at bedtime.  1   No current facility-administered medications for this visit.   Allergies:  Amphetamine-dextroamphetamine, Nitrofuran derivatives, Amphetamine-dextroamphet er, Pregabalin, Topiramate, and Verelan [verapamil]   Social History: The patient  reports that she has never smoked. She has never used smokeless tobacco. She reports that she does not drink alcohol and does not use drugs.   Family History: The patient's family history includes Cancer in her brother, brother, maternal aunt, maternal uncle, paternal aunt, paternal uncle, and another family member; Colon cancer in her mother; Coronary artery disease in an other family member; Heart attack in her father and  mother; Hemophilia in her child; Stroke in her sister.   ROS:  Please see the history of present illness. Otherwise, complete review of systems is  positive for {NONE DEFAULTED:18576::"none"}.  All other systems are reviewed and negative.   Physical Exam: VS:  There were no vitals taken for this visit., BMI There is no height or weight on file to calculate BMI.  Wt Readings from Last 3 Encounters:  10/27/19 224 lb (101.6 kg)  10/26/19 224 lb (101.6 kg)  10/10/19 (!) 220 lb (99.8 kg)    General: Patient appears comfortable at rest. HEENT: Conjunctiva and lids normal, oropharynx clear with moist mucosa. Neck: Supple, no elevated JVP or carotid bruits, no thyromegaly. Lungs: Clear to auscultation, nonlabored breathing at rest. Cardiac: Regular rate and rhythm, no S3 or significant systolic murmur, no pericardial rub. Abdomen: Soft, nontender, no hepatomegaly, bowel sounds present, no guarding or rebound. Extremities: No pitting edema, distal pulses 2+. Skin: Warm and dry. Musculoskeletal: No kyphosis. Neuropsychiatric: Alert and oriented x3, affect grossly appropriate.  ECG:  {EKG/Telemetry Strips [phone]}  Recent Labwork: 11/02/2018: ALT 13; AST 13 10/27/2019: BUN 16; Creatinine, Ser 0.66; Potassium 4.3; Sodium 140     Component Value Date/Time   CHOL 173 07/06/2018 0936   CHOL 184 04/15/2016 1121   TRIG 109 07/06/2018 0936   HDL 62 07/06/2018 0936   HDL 77 04/15/2016 1121   CHOLHDL 2.8 07/06/2018 0936   VLDL UNABLE TO CALCULATE IF TRIGLYCERIDE OVER 400 mg/dL 02/18/2017 0528   LDLCALC 90 07/06/2018 0936    Other Studies Reviewed Today:  Echocardiogram 04/05/2019 ([facility]): Summary  1. The left ventricle is normal in size with mildly increased wallvthickness.  2. The left ventricular systolic function is normal, LVEF is visuallyvestimated at > 55%. Grade 2 diastolic dysfunction.  3. The left atrium is mildly dilated in size.  4. The right ventricle is mildly dilated in size, with normal systolic function.  5. The right atrium is mildly dilated in size.  NST 08/29/2016 Study Result  Narrative  & Impression   There was no ST segment deviation noted during stress.  The study is normal.  This is a low risk study.  Nuclear stress EF: 68%.       Assessment and Plan:  1. Encounter for pre-operative cardiovascular clearance   2. Chest pain, unspecified type   3. CAD in native artery   4. Chronic diastolic heart failure ([place])   5. Essential hypertension, benign   6. Mixed hyperlipidemia   7. OSA (obstructive sleep apnea)    1. Encounter for pre-operative cardiovascular clearance ***  2. Chest pain, unspecified type ***  3. CAD in native artery ***  4. Chronic diastolic heart failure (HCC) ***  5. Essential hypertension, benign ***  6. Mixed hyperlipidemia ***  7. OSA (obstructive sleep apnea)  Medication Adjustments/Labs and Tests Ordered: Current medicines are reviewed at length with the patient today.  Concerns regarding medicines are outlined above.   Disposition: Follow-up with ***  Signed, Levell July, NP 10/27/2019 10:38 PM    [facility] [place], [place], [place] [address] Phone: [phone]; Fax: [phone]

## 2019-10-27 NOTE — Telephone Encounter (Signed)
   Primary Cardiologist: Kate Sable, MD (Inactive)  Chart reviewed and the patient was contacted today by phone as part of pre-operative protocol coverage.  [name] was last seen on 07/15/2019 by Dr Bronson Ing.  Today she said she had been having chest pain-"not bad".  She has not used NTG.  This was new for her.   Due to new or worsening symptoms, [name] will require a follow-up visit for further pre-operative risk assessment.  The patient's surgery is scheduled for Monday 8/16. Please make an appointment for her to be seen in the [facility] office ASAP for clearance.   Pre-op covering staff: - Please schedule appointment and call patient to inform them. If patient already had an upcoming appointment within acceptable timeframe, please add "pre-op clearance" to the appointment notes so provider is aware. - Please contact requesting surgeon's office via preferred method (i.e, phone, fax) to inform them of need for appointment prior to surgery.  Kerin Ransom, PA-C 10/27/2019, 2:57 PM

## 2019-10-28 ENCOUNTER — Telehealth: Payer: Self-pay

## 2019-10-28 ENCOUNTER — Telehealth: Payer: Self-pay | Admitting: Urology

## 2019-10-28 ENCOUNTER — Ambulatory Visit: Payer: Medicare Other | Admitting: Family Medicine

## 2019-10-28 NOTE — Telephone Encounter (Signed)
Called pt. No answer. No way to leave message.

## 2019-10-28 NOTE — Telephone Encounter (Signed)
Attempted to call patient x2. No answer mailbox full.  Pre-op asking for cardiology clearance prior to surgery on 8/16. Surgery procedure discussed with Dr. Alyson Ingles and surgery date moved to Sept.

## 2019-10-28 NOTE — Telephone Encounter (Signed)
Pt asks for a nurse to return her call. She states she cant have her procedure on Monday.

## 2019-11-02 ENCOUNTER — Telehealth: Payer: Self-pay

## 2019-11-02 NOTE — Telephone Encounter (Signed)
Patient called stating that her brother passed away and she needs something to calm nerves. However [name] is out of the office this week. I advised patient to call PCP to have PCP to prescribed something. She states PCP asked her to call our office. I called PCP and spoke to [facility] to give them the ok to prescribe something. Suanne Marker states she will give patient a call.

## 2019-11-03 ENCOUNTER — Encounter: Payer: Self-pay | Admitting: Family Medicine

## 2019-11-03 ENCOUNTER — Ambulatory Visit (INDEPENDENT_AMBULATORY_CARE_PROVIDER_SITE_OTHER): Payer: Medicare Other | Admitting: Family Medicine

## 2019-11-03 VITALS — BP 174/92 | HR 86 | Ht 64.0 in | Wt 227.0 lb

## 2019-11-03 DIAGNOSIS — Z01818 Encounter for other preprocedural examination: Secondary | ICD-10-CM | POA: Diagnosis not present

## 2019-11-03 DIAGNOSIS — G4733 Obstructive sleep apnea (adult) (pediatric): Secondary | ICD-10-CM

## 2019-11-03 DIAGNOSIS — E782 Mixed hyperlipidemia: Secondary | ICD-10-CM

## 2019-11-03 DIAGNOSIS — I5032 Chronic diastolic (congestive) heart failure: Secondary | ICD-10-CM

## 2019-11-03 DIAGNOSIS — I2510 Atherosclerotic heart disease of native coronary artery without angina pectoris: Secondary | ICD-10-CM | POA: Diagnosis not present

## 2019-11-03 DIAGNOSIS — R079 Chest pain, unspecified: Secondary | ICD-10-CM

## 2019-11-03 DIAGNOSIS — I10 Essential (primary) hypertension: Secondary | ICD-10-CM

## 2019-11-03 NOTE — Patient Instructions (Addendum)

## 2019-11-03 NOTE — Progress Notes (Addendum)
Cardiology Office Note  Date: 11/03/2019   ID: [name], DOB 25-Mar-1950, MRN [id number]  PCP:  Glenda Chroman, MD  Cardiologist:  No primary care provider on file. Electrophysiologist:  None   Chief Complaint: Needs Pre-op clearance for surgery  History of Present Illness: [name] is a 70 y.o. female with a history of CAD (MI 2017 PCI of the circumflex), history of IDDM, OSA, IBS, GERD, chronic diastolic heart failure, tachycardia, palpitations, dyslipidemia.  Had Covid in January 2021 and hospitalized for 1 month at [facility].  Was in rehab and discharged to home on 06/18/2019.  At last visit with Dr. Bronson Ing via telemedicine she was ambulating with a walker and slowly regaining her energy.  Her coronary artery disease was stable.  Normal nuclear stress test June 2018.  She was continuing aspirin, atorvastatin and metoprolol succinate.  Her chronic diastolic heart failure was stable.  She was euvolemic and no longer on Lasix.  Had grade 2 DD on echo 04/05/2019 [facility].   She was scheduled to undergo a cystoscopy with right retrograde ureteroscopy w/ laser w/ stent placement on Monday 10/31/2019. She was complaining of CP described as "not bad" and had not used NTG. This was new for her. Kerin Ransom PA-C recommended appointment in [place] office for cardiac clearance.  Surgery had to be rescheduled for 12-20-2022 due to the death of her brother.  She is here today for preop evaluation for surgical clearance for cystoscopy by Dr. Alyson Ingles on Nov 27, 2022.  Her brother recently died and she is emotionally upset.  Her blood pressure is elevated today.  Recheck in right arm blood pressure was 150/80.  Patient states her blood pressures at home are usually in the 235T to 614E systolic.  She states she is sure it is due to the stress and anxiety from the recent death of her brother.  She denies any progressive anginal or exertional symptoms, palpitations or arrhythmias,  orthostatic symptoms, PND, orthopnea, bleeding issues.  Denies any claudication-like issues, DVT or PE-like symptoms.  She does have some mild lower extremity edema left greater than right.  She had a normal nuclear stress test on 6 08/29/2016.  History of previous stent placement to circumflex 2007 with patent stents in 2008 during catheterization.   Past Medical History:  Diagnosis Date  . Anxiety and depression   . Arthritis   . CAD (coronary artery disease)    Stent placement circumflex coronary 2007, catheterization 2008 patent stents. Normal LV function  . Chest pain   . CHF (congestive heart failure) ([place])   . COPD (chronic obstructive pulmonary disease) (HCC)    no home O2  . Depression   . Diabetes mellitus    Insulin dependent  . Diabetic polyneuropathy (HCC)     severe on multiple medications  . Dyslipidemia   . GERD (gastroesophageal reflux disease)   . Headache(784.0)   . Heart attack ([place])   . Heartburn   . 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 High cholesterol   . Hx of blood clots   . Hypertension   . MI (myocardial infarction) ([place])    2007  . Obstructive sleep apnea    CPAP, setting ?2  . Palpitations   . Sleep apnea   . Tachycardia     Past Surgical History:  Procedure Laterality Date  . ABDOMINAL HYSTERECTOMY    . Arm Surgery Bilateral    due to fall-pt broke both forearms  .  BIOPSY N/A 07/01/2013   Procedure: BIOPSY;  Surgeon: Rogene Houston, MD;  Location: [facility];  Service: Endoscopy;  Laterality: N/A;  . CHOLECYSTECTOMY    . COLONOSCOPY  08/06/2011   Procedure: COLONOSCOPY;  Surgeon: Rogene Houston, MD;  Location: [facility] SU[facility];  Service: Endoscopy;  Laterality: N/A;  215  . COLONOSCOPY WITH PROPOFOL N/A 12/31/2012   Procedure: COLONOSCOPY WITH PROPOFOL;  Surgeon: Rogene Houston, MD;  Location: [facility];  Service: Endoscopy;  Laterality: N/A;  in cecum at 0814, total withdrawal time 4mn  . COLONOSCOPY WITH PROPOFOL N/A 02/05/2018   Procedure: COLONOSCOPY  WITH PROPOFOL;  Surgeon: RRogene Houston MD;  Location: [facility];  Service: Endoscopy;  Laterality: N/A;  1:25  . CORONARY ANGIOPLASTY WITH STENT PLACEMENT    . ESOPHAGEAL DILATION N/A 06/07/2015   Procedure: ESOPHAGEAL DILATION;  Surgeon: NRogene Houston MD;  [facility]: [facility];  Service: Endoscopy;  Laterality: N/A;  . ESOPHAGOGASTRODUODENOSCOPY N/A 06/07/2015   Procedure: ESOPHAGOGASTRODUODENOSCOPY (EGD);  Surgeon: NRogene Houston MD;  [facility]: [facility];  Service: Endoscopy;  Laterality: N/A;  2:45 - moved to 1:00 - Ann to notify pt  . ESOPHAGOGASTRODUODENOSCOPY (EGD) WITH PROPOFOL N/A 12/31/2012   Procedure: ESOPHAGOGASTRODUODENOSCOPY (EGD) WITH PROPOFOL;  Surgeon: NRogene Houston MD;  Location: [facility];  Service: Endoscopy;  Laterality: N/A;  GE junction 37,  . ESOPHAGOGASTRODUODENOSCOPY (EGD) WITH PROPOFOL N/A 07/01/2013   Procedure: ESOPHAGOGASTRODUODENOSCOPY (EGD) WITH PROPOFOL;  Surgeon: NRogene Houston MD;  Location: [facility];  Service: Endoscopy;  Laterality: N/A;  950  . MALONEY DILATION N/A 12/31/2012   Procedure: MALONEY DILATION;  Surgeon: NRogene Houston MD;  Location: [facility];  Service: Endoscopy;  Laterality: N/A;  used a # 54,#56  . MALONEY DILATION N/A 07/01/2013   Procedure: MVenia MinksDILATION;  Surgeon: NRogene Houston MD;  Location: [facility];  Service: Endoscopy;  Laterality: N/A;  54/58; no heme present  . POLYPECTOMY  02/05/2018   Procedure: POLYPECTOMY;  Surgeon: RRogene Houston MD;  [facility];  Service: Endoscopy;;  distal rectum (HS)    Current Outpatient Medications  Medication Sig Dispense Refill  . amLODipine-valsartan (EXFORGE) 10-160 MG tablet Take 1 tablet by mouth daily.    . Armodafinil 150 MG tablet Take 150 mg by mouth daily.     .Marland Kitchenaspirin EC 81 MG tablet Take 1 tablet (81 mg total) by mouth daily.    .Marland Kitchenatorvastatin (LIPITOR) 40 MG tablet Take 40 mg by mouth daily.    . B-D ULTRAFINE III SHORT PEN 31G X 8 MM MISC Inject into  the skin 4 (four) times daily.    .Marland KitchenbuPROPion (WELLBUTRIN SR) 100 MG 12 hr tablet Take 100 mg by mouth daily.    .Marland KitchenbuPROPion (WELLBUTRIN XL) 150 MG 24 hr tablet Take 150-300 mg by mouth See admin instructions. Take 300 mg by mouth in the morning and take 150 mg by mouth at noon    . Cholecalciferol (VITAMIN D3) 125 MCG (5000 UT) CAPS TAKE ONE CAPSULE BY MOUTH DAILY 90 capsule 0  . desvenlafaxine (PRISTIQ) 100 MG 24 hr tablet Take by mouth.    . diclofenac Sodium (VOLTAREN) 1 % GEL APPLY TO BOTH KNEES THREE TIMES DAILY (Patient taking differently: Apply 1 application topically 3 (three) times daily as needed (knee pain.). ) 300 g 2  . doxepin (SINEQUAN) 25 MG capsule TAKE ONE TO THREE CAPSULES 30 MINUTES BEFORE BEDTIME    . estradiol (  ESTRACE) 0.5 MG tablet Take 0.5 mg by mouth daily.     Marland Kitchen FLUoxetine (PROZAC) 20 MG capsule Take 20 mg by mouth 2 (two) times daily.   1  . furosemide (LASIX) 20 MG tablet Take 1 tablet by mouth daily as needed.    . gabapentin (NEURONTIN) 400 MG capsule Take 400 mg by mouth 3 (three) times daily.  5  . glipiZIDE (GLUCOTROL XL) 5 MG 24 hr tablet TAKE 1 TABLET BY MOUTH EVERY DAY WITH BREAKFAST 30 tablet 3  . hydrOXYzine (ATARAX/VISTARIL) 10 MG tablet Take 10 mg by mouth 4 (four) times daily as needed for anxiety.    . lidocaine (XYLOCAINE) 5 % ointment Apply 1 application topically at bedtime.     Marland Kitchen lisinopril (ZESTRIL) 5 MG tablet TAKE 1 TABLET BY MOUTH DAILY (Patient taking differently: Take 5 mg by mouth daily. ) 30 tablet 6  . loperamide (IMODIUM) 2 MG capsule     . LORazepam (ATIVAN) 0.5 MG tablet Take 0.5 mg by mouth 2 (two) times daily as needed for anxiety.   1  . meloxicam (MOBIC) 7.5 MG tablet Take 1 tablet (7.5 mg total) by mouth daily.  4  . metFORMIN (GLUCOPHAGE) 500 MG tablet TAKE 1 TABLET BY MOUTH TWICE DAILY 60 tablet 3  . methocarbamol (ROBAXIN) 500 MG tablet Take 1 tablet (500 mg total) by mouth 3 (three) times daily. (Patient taking differently: Take  500 mg by mouth 3 (three) times daily as needed for muscle spasms. ) 45 tablet 0  . metoCLOPramide (REGLAN) 5 MG tablet Take 5 mg by mouth 3 (three) times daily as needed for nausea or vomiting.   1  . metoprolol succinate (TOPROL-XL) 50 MG 24 hr tablet TAKE 1 AND 1/2 TABLETS BY MOUTH EVERY DAY 135 tablet 1  . montelukast (SINGULAIR) 10 MG tablet Take by mouth.    . morphine (MS CONTIN) 30 MG 12 hr tablet Take 1 tablet (30 mg total) by mouth every 12 (twelve) hours. 60 tablet 0  . morphine (MSIR) 15 MG tablet Take 1 tablet (15 mg total) by mouth daily as needed for severe pain. 30 tablet 0  . nitroGLYCERIN (NITROSTAT) 0.4 MG SL tablet Place 1 tablet (0.4 mg total) under the tongue every 5 (five) minutes x 3 doses as needed. For chest pains 25 tablet 3  . ondansetron (ZOFRAN) 4 MG tablet Take 4 mg by mouth every 6 (six) hours as needed for nausea or vomiting.    . pantoprazole (PROTONIX) 40 MG tablet TAKE 1 TABLET BY MOUTH TWICE DAILY BEFORE MEALS 60 tablet 1  . phentermine (ADIPEX-P) 37.5 MG tablet Take by mouth.    . polyethylene glycol powder (GLYCOLAX/MIRALAX) 17 GM/SCOOP powder MIX 1 CAPFUL OF POWDER (17 GM) IN 8 OZ OF JUICE OR WATER AND DRINK BY MOUTH DAILY IN THE EVENING    . potassium chloride (K-DUR,KLOR-CON) 10 MEQ tablet Take 10 mEq by mouth 2 (two) times daily.  12  . PROAIR HFA 108 (90 Base) MCG/ACT inhaler Inhale 2 puffs into the lungs every 4 (four) hours as needed for wheezing or shortness of breath.   8  . promethazine (PHENERGAN) 25 MG tablet Take 1 tablet (25 mg total) by mouth 4 (four) times daily as needed. 30 tablet 0  . sucralfate (CARAFATE) 1 g tablet Take 1 tablet (1 g total) by mouth 4 (four) times daily -  with meals and at bedtime. 120 tablet 3  . topiramate (TOPAMAX) 200 MG tablet Take  200 mg by mouth at bedtime.  3  . TRESIBA FLEXTOUCH 100 UNIT/ML SOPN FlexTouch Pen INJECT 60 UNITS INTO THE SKIN AT BEDTIME 30 mL 0  . zolpidem (AMBIEN) 10 MG tablet Take 10 mg by mouth at  bedtime.  1  . lidocaine-hydrocortisone (ANAMANTEL HC) 3-0.5 % CREA Place 1 Applicatorful rectally 2 (two) times daily for 7 days. 14 g 0   No current facility-administered medications for this visit.   Allergies:  Amphetamine-dextroamphetamine, Nitrofuran derivatives, Amphetamine-dextroamphet er, Pregabalin, Topiramate, and Verelan [verapamil]   Social History: The patient  reports that she has never smoked. She has never used smokeless tobacco. She reports that she does not drink alcohol and does not use drugs.   Family History: The patient's family history includes Cancer in her brother, brother, maternal aunt, maternal uncle, paternal aunt, paternal uncle, and another family member; Colon cancer in her mother; Coronary artery disease in an other family member; Heart attack in her father and mother; Hemophilia in her child; Stroke in her sister.   ROS:  Please see the history of present illness. Otherwise, complete review of systems is positive for none.  All other systems are reviewed and negative.   Physical Exam: VS:  BP (!) 174/92   Pulse 86   Ht 5' 4"  (1.626 m)   Wt 227 lb (103 kg)   SpO2 98%   BMI 38.96 kg/m , BMI Body mass index is 38.96 kg/m.  Wt Readings from Last 3 Encounters:  11/03/19 227 lb (103 kg)  10/27/19 224 lb (101.6 kg)  10/26/19 224 lb (101.6 kg)    General: Obese patient appears comfortable at rest. Neck: Supple, no elevated JVP or carotid bruits, no thyromegaly. Lungs: Clear to auscultation, nonlabored breathing at rest. Cardiac: Regular rate and rhythm, no S3 or significant systolic murmur, no pericardial rub. Extremities: No pitting edema, distal pulses 2+. Skin: Warm and dry. Musculoskeletal: No kyphosis. Neuropsychiatric: Alert and oriented x3, affect grossly appropriate.  ECG:  EKG dated October 27, 2019 shows normal sinus rhythm rate of 84.  Recent Labwork: 10/27/2019: BUN 16; Creatinine, Ser 0.66; Potassium 4.3; Sodium 140     Component Value  Date/Time   CHOL 173 07/06/2018 0936   CHOL 184 04/15/2016 1121   TRIG 109 07/06/2018 0936   HDL 62 07/06/2018 0936   HDL 77 04/15/2016 1121   CHOLHDL 2.8 07/06/2018 0936   VLDL UNABLE TO CALCULATE IF TRIGLYCERIDE OVER 400 mg/dL 02/18/2017 0528   LDLCALC 90 07/06/2018 0936    Other Studies Reviewed Today:  Echocardiogram 04/05/2019 ([place]): Summary  1. The left ventricle is normal in size with mildly increased wallvthickness.  2. The left ventricular systolic function is normal, LVEF is visuallyvestimated at > 55%. Grade 2 diastolic dysfunction.  3. The left atrium is mildly dilated in size.  4. The right ventricle is mildly dilated in size, with normal systolic function.  5. The right atrium is mildly dilated in size.  NST 08/29/2016 Study Result  Narrative & Impression   There was no ST segment deviation noted during stress.  The study is normal.  This is a low risk study.  Nuclear stress EF: 68%.       Assessment and Plan:  1. Encounter for pre-operative cardiovascular clearance Here for preop clearance for cystoscopy with retrograde pyelogram, ureteroscopic and stent placement right side with holmium laser application.  Dr. Nicolette Bang scheduled for November 17, 2019.  Revised cardiac risk index a total of 3 points.  Putting the patient at a perioperative risk of major cardiac event at 11%.  [facility] activity status index score 4.5 with functional capacity and METS is 3.3.  Given low risk of procedure with low physiologic effect, patient would be considered a moderate risk for cystectomy from a cardiac standpoint due to multiple comorbidities.  2. Chest pain, unspecified type She was complaining of CP described as "not bad" and had not used NTG. This was new for her. Kerin Ransom PA-C recommended appointment in [place] office for cardiac clearance.  Patient denies any any recent progressive anginal or exertional symptoms since the episode described to Mr.  Rosalyn Gess earlier.  She had a normal nuclear stress test in 2018 which was a low risk study.  3. CAD in native artery No recent progressive anginal or exertional symptoms.  Continue aspirin 81 mg.  Continue nitroglycerin sublingual 0.4 mg as needed for chest pain  4. Chronic diastolic heart failure ([place] Recent echo in January 2021 at [facility] showed grade 2 diastolic dysfunction on echo along with LVEF greater than 55%.  Continue Lasix 20 mg p.o. as needed.  Continue Toprol-XL 50 mg daily.  Continue lisinopril 5 mg p.o. daily.  He does have some mild lower extremity nonpitting edema.  She continues with Lasix as needed  5. Essential hypertension, benign Blood pressure elevated today on arrival at 174/92.  Recheck in right arm was 150/80.  Patient states her systolic blood pressures run in the 120s to 130s at home.  She states she is under a lot of stress and anxiety due to the recent death of her brother.  She attributes this to the increase in her recent blood pressure.  Continue amlodipine/valsartan 10/160 mg daily.  Continue lisinopril 5 mg p.o. daily.  Continue Toprol-XL 50 mg p.o. daily.  6. Mixed hyperlipidemia Last lipid panel showed total cholesterol 173, HDL 62, triglycerides 109, LDL 90.  Continue atorvastatin 40 mg p.o. daily.  7. OSA (obstructive sleep apnea) Patient states she recently just started wearing her CPAP device again over the last 2 nights and has been sleeping well.  Medication Adjustments/Labs and Tests Ordered: Current medicines are reviewed at length with the patient today.  Concerns regarding medicines are outlined above.   Disposition: Follow-up with Dr. Harl Bowie or APP 6 months  Signed, Levell July, NP 11/03/2019 3:17 PM    [facility] [place], [place], [place] [address] Phone: [phone]; Fax: [phone]

## 2019-11-04 ENCOUNTER — Encounter: Payer: Self-pay | Admitting: Family Medicine

## 2019-11-10 ENCOUNTER — Telehealth: Payer: Self-pay | Admitting: Urology

## 2019-11-10 ENCOUNTER — Encounter: Payer: Self-pay | Admitting: Urology

## 2019-11-10 ENCOUNTER — Other Ambulatory Visit: Payer: Medicare Other | Admitting: Urology

## 2019-11-10 HISTORY — PX: CYSTOSCOPY W/ URETERAL STENT PLACEMENT: SHX1429

## 2019-11-10 NOTE — Progress Notes (Signed)
We received a call from [facility] that the patient, [name], was admitted with sepsis from a right ureteral calculus. I discussed the case with the patients provider at [facility], [place], [place]. [facility] was requesting transfer of the patient to [facility] [place] or [facility] for a right nephrostomy tube placement. Currently [facility] and [facility] are not accepting patient transfers and [facility] does not have interventional radiology. [facility] was instructed to contact [place], [place] for possible transfer.  I also instructed them to start broad spectrum antibiotics for treatment of the patients sepsis.  Marland Kitchen

## 2019-11-14 NOTE — Telephone Encounter (Signed)
Entered in error

## 2019-11-15 ENCOUNTER — Encounter (HOSPITAL_COMMUNITY): Payer: Self-pay

## 2019-11-15 ENCOUNTER — Encounter (HOSPITAL_COMMUNITY)
Admission: RE | Admit: 2019-11-15 | Discharge: 2019-11-15 | Disposition: A | Discharge status: Home / Self Care | Payer: Medicare Other | Source: Ambulatory Visit | Attending: Urology | Admitting: Urology

## 2019-11-15 ENCOUNTER — Other Ambulatory Visit (HOSPITAL_COMMUNITY)
Admission: RE | Admit: 2019-11-15 | Discharge: 2019-11-15 | Disposition: A | Discharge status: Home / Self Care | Payer: Medicare Other | Source: Ambulatory Visit | Attending: Urology | Admitting: Urology

## 2019-11-15 NOTE — Pre-Procedure Instructions (Signed)
While looking over PAT chart, I discovered that [name] is still an inpatient at [facility] where she had surgery on 11/10/2019. Interoffice message sent to Gibson Ramp at Dr Noland Fordyce office.

## 2019-11-15 NOTE — Patient Instructions (Signed)
[name]  11/15/2019     @PREFPERIOPPHARMACY @   Your procedure is scheduled on  11/17/2019.  Report to [facility] at  1200  P.M.  Call this number if you have problems the morning of surgery:  [phone]   Remember:  Do not eat or drink after midnight.                      Take these medicines the morning of surgery with A SIP OF WATER exforge, armodafiniel, wellbutrin, pristiq, prozac, gabapentin, hydroxyzine, ativan(if needed), mobic or robaxin (if needed), reglan, metoprolol, MS Contin or Morphine(if needed), zofran(if needed), protonix. Use your inhaler before you come. Take 30 units of your insulin the night before your procedure. DO NOT take any medication for diabetes the morning of your procedure.    Do not wear jewelry, make-up or nail polish.  Do not wear lotions, powders, or perfumes. Please wear deodorant and brush your teeth.  Do not shave 48 hours prior to surgery.  Men may shave face and neck.  Do not bring valuables to the hospital.  [facility] is not responsible for any belongings or valuables.  Contacts, dentures or bridgework may not be worn into surgery.  Leave your suitcase in the car.  After surgery it may be brought to your room.  For patients admitted to the hospital, discharge time will be determined by your treatment team.  Patients discharged the day of surgery will not be allowed to drive home.   Name and phone number of your driver:   family Special instructions:  DO NOT smoke the morning of your procedure.  Please read over the following fact sheets that you were given. Anesthesia Post-op Instructions and Care and Recovery After Surgery       Ureteral Stent Implantation, Care After This sheet gives you information about how to care for yourself after your procedure. Your health care provider may also give you more specific instructions. If you have problems or questions, contact your health care provider. What can I expect  after the procedure? After the procedure, it is common to have:  Nausea.  Mild pain when you urinate. You may feel this pain in your lower back or lower abdomen. The pain should stop within a few minutes after you urinate. This may last for up to 1 week.  A small amount of blood in your urine for several days. Follow these instructions at home: Medicines  Take over-the-counter and prescription medicines only as told by your health care provider.  If you were prescribed an antibiotic medicine, take it as told by your health care provider. Do not stop taking the antibiotic even if you start to feel better.  Do not drive for 24 hours if you were given a sedative during your procedure.  Ask your health care provider if the medicine prescribed to you requires you to avoid driving or using heavy machinery. Activity  Rest as told by your health care provider.  Avoid sitting for a long time without moving. Get up to take short walks every 1-2 hours. This is important to improve blood flow and breathing. Ask for help if you feel weak or unsteady.  Return to your normal activities as told by your health care provider. Ask your health care provider what activities are safe for you. General instructions   Watch for any blood in your urine. Call your health care provider if  the amount of blood in your urine increases.  If you have a catheter: ? Follow instructions from your health care provider about taking care of your catheter and collection bag. ? Do not take baths, swim, or use a hot tub until your health care provider approves. Ask your health care provider if you may take showers. You may only be allowed to take sponge baths.  Drink enough fluid to keep your urine pale yellow.  Do not use any products that contain nicotine or tobacco, such as cigarettes, e-cigarettes, and chewing tobacco. These can delay healing after surgery. If you need help quitting, ask your health care  provider.  Keep all follow-up visits as told by your health care provider. This is important. Contact a health care provider if:  You have pain that gets worse or does not get better with medicine, especially pain when you urinate.  You have difficulty urinating.  You feel nauseous or you vomit repeatedly during a period of more than 2 days after the procedure. Get help right away if:  Your urine is dark red or has blood clots in it.  You are leaking urine (have incontinence).  The end of the stent comes out of your urethra.  You cannot urinate.  You have sudden, sharp, or severe pain in your abdomen or lower back.  You have a fever.  You have swelling or pain in your legs.  You have difficulty breathing. Summary  After the procedure, it is common to have mild pain when you urinate that goes away within a few minutes after you urinate. This may last for up to 1 week.  Watch for any blood in your urine. Call your health care provider if the amount of blood in your urine increases.  Take over-the-counter and prescription medicines only as told by your health care provider.  Drink enough fluid to keep your urine pale yellow. This information is not intended to replace advice given to you by your health care provider. Make sure you discuss any questions you have with your health care provider. Document Revised: 12/08/2017 Document Reviewed: 12/09/2017 Elsevier Patient Education  2020 [employer].  Cystoscopy Cystoscopy is a procedure that is used to help diagnose and sometimes treat conditions that affect the lower urinary tract. The lower urinary tract includes the bladder and the urethra. The urethra is the tube that drains urine from the bladder. Cystoscopy is done using a thin, tube-shaped instrument with a light and camera at the end (cystoscope). The cystoscope may be hard or flexible, depending on the goal of the procedure. The cystoscope is inserted through the urethra,  into the bladder. Cystoscopy may be recommended if you have:  Urinary tract infections that keep coming back.  Blood in the urine (hematuria).  An inability to control when you urinate (urinary incontinence) or an overactive bladder.  Unusual cells found in a urine sample.  A blockage in the urethra, such as a urinary stone.  Painful urination.  An abnormality in the bladder found during an intravenous pyelogram (IVP) or CT scan. Cystoscopy may also be done to remove a sample of tissue to be examined under a microscope (biopsy). Tell a health care provider about:  Any allergies you have.  All medicines you are taking, including vitamins, herbs, eye drops, creams, and over-the-counter medicines.  Any problems you or family members have had with anesthetic medicines.  Any blood disorders you have.  Any surgeries you have had.  Any medical conditions you have.  Whether you are pregnant or may be pregnant. What are the risks? Generally, this is a safe procedure. However, problems may occur, including:  Infection.  Bleeding.  Allergic reactions to medicines.  Damage to other structures or organs. What happens before the procedure?  Ask your health care provider about: ? Changing or stopping your regular medicines. This is especially important if you are taking diabetes medicines or blood thinners. ? Taking medicines such as aspirin and ibuprofen. These medicines can thin your blood. Do not take these medicines unless your health care provider tells you to take them. ? Taking over-the-counter medicines, vitamins, herbs, and supplements.  Follow instructions from your health care provider about eating or drinking restrictions.  Ask your health care provider what steps will be taken to help prevent infection. These may include: ? Washing skin with a germ-killing soap. ? Taking antibiotic medicine.  You may have an exam or testing, such as: ? X-rays of the bladder,  urethra, or kidneys. ? Urine tests to check for signs of infection.  Plan to have someone take you home from the hospital or clinic. What happens during the procedure?   You will be given one or more of the following: ? A medicine to help you relax (sedative). ? A medicine to numb the area (local anesthetic).  The area around the opening of your urethra will be cleaned.  The cystoscope will be passed through your urethra into your bladder.  Germ-free (sterile) fluid will flow through the cystoscope to fill your bladder. The fluid will stretch your bladder so that your health care provider can clearly examine your bladder walls.  Your doctor will look at the urethra and bladder. Your doctor may take a biopsy or remove stones.  The cystoscope will be removed, and your bladder will be emptied. The procedure may vary among health care providers and hospitals. What can I expect after the procedure? After the procedure, it is common to have:  Some soreness or pain in your abdomen and urethra.  Urinary symptoms. These include: ? Mild pain or burning when you urinate. Pain should stop within a few minutes after you urinate. This may last for up to 1 week. ? A small amount of blood in your urine for several days. ? Feeling like you need to urinate but producing only a small amount of urine. Follow these instructions at home: Medicines  Take over-the-counter and prescription medicines only as told by your health care provider.  If you were prescribed an antibiotic medicine, take it as told by your health care provider. Do not stop taking the antibiotic even if you start to feel better. General instructions  Return to your normal activities as told by your health care provider. Ask your health care provider what activities are safe for you.  Do not drive for 24 hours if you were given a sedative during your procedure.  Watch for any blood in your urine. If the amount of blood in your  urine increases, call your health care provider.  Follow instructions from your health care provider about eating or drinking restrictions.  If a tissue sample was removed for testing (biopsy) during your procedure, it is up to you to get your test results. Ask your health care provider, or the department that is doing the test, when your results will be ready.  Drink enough fluid to keep your urine pale yellow.  Keep all follow-up visits as told by your health care provider. This is important. Contact  a health care provider if you:  Have pain that gets worse or does not get better with medicine, especially pain when you urinate.  Have trouble urinating.  Have more blood in your urine. Get help right away if you:  Have blood clots in your urine.  Have abdominal pain.  Have a fever or chills.  Are unable to urinate. Summary  Cystoscopy is a procedure that is used to help diagnose and sometimes treat conditions that affect the lower urinary tract.  Cystoscopy is done using a thin, tube-shaped instrument with a light and camera at the end.  After the procedure, it is common to have some soreness or pain in your abdomen and urethra.  Watch for any blood in your urine. If the amount of blood in your urine increases, call your health care provider.  If you were prescribed an antibiotic medicine, take it as told by your health care provider. Do not stop taking the antibiotic even if you start to feel better. This information is not intended to replace advice given to you by your health care provider. Make sure you discuss any questions you have with your health care provider. Document Revised: 02/23/2018 Document Reviewed: 02/23/2018 Elsevier Patient Education  [place].  Ureteroscopy Ureteroscopy is a procedure to check for and treat problems inside part of the urinary tract. In this procedure, a thin, tube-shaped instrument with a light at the end (ureteroscope) is used  to look at the inside of the kidneys and the ureters, which are the tubes that carry urine from the kidneys to the bladder. The ureteroscope is inserted into one or both of the ureters. You may need this procedure if you have frequent urinary tract infections (UTIs), blood in your urine, or a stone in one of your ureters. A ureteroscopy can be done to find the cause of urine blockage in a ureter and to evaluate other abnormalities inside the ureters or kidneys. If stones are found, they can be removed during the procedure. Polyps, abnormal tissue, and some types of tumors can also be removed or treated. The ureteroscope may also have a tool to remove tissue to be checked for disease under a microscope (biopsy). Tell a health care provider about:  Any allergies you have.  All medicines you are taking, including vitamins, herbs, eye drops, creams, and over-the-counter medicines.  Any problems you or family members have had with anesthetic medicines.  Any blood disorders you have.  Any surgeries you have had.  Any medical conditions you have.  Whether you are pregnant or may be pregnant. What are the risks? Generally, this is a safe procedure. However, problems may occur, including:  Bleeding.  Infection.  Allergic reactions to medicines.  Scarring that narrows the ureter (stricture).  Creating a hole in the ureter (perforation). What happens before the procedure? Staying hydrated Follow instructions from your health care provider about hydration, which may include:  Up to 2 hours before the procedure - you may continue to drink clear liquids, such as water, clear fruit juice, black coffee, and plain tea. Eating and drinking restrictions Follow instructions from your health care provider about eating and drinking, which may include:  8 hours before the procedure - stop eating heavy meals or foods such as meat, fried foods, or fatty foods.  6 hours before the procedure - stop  eating light meals or foods, such as toast or cereal.  6 hours before the procedure - stop drinking milk or drinks that contain  milk.  2 hours before the procedure - stop drinking clear liquids. Medicines  Ask your health care provider about: ? Changing or stopping your regular medicines. This is especially important if you are taking diabetes medicines or blood thinners. ? Taking medicines such as aspirin and ibuprofen. These medicines can thin your blood. Do not take these medicines before your procedure if your health care provider instructs you not to.  You may be given antibiotic medicine to help prevent infection. General instructions  You may have a urine sample taken to check for infection.  Plan to have someone take you home from the hospital or clinic. What happens during the procedure?   To reduce your risk of infection: ? Your health care team will wash or sanitize their hands. ? Your skin will be washed with soap.  An IV tube will be inserted into one of your veins.  You will be given one of the following: ? A medicine to help you relax (sedative). ? A medicine to make you fall asleep (general anesthetic). ? A medicine that is injected into your spine to numb the area below and slightly above the injection site (spinal anesthetic).  To lower your risk of infection, you may be given an antibiotic medicine by an injection or through the IV tube.  The opening from which you urinate (urethra) will be cleaned with a germ-killing solution.  The ureteroscope will be passed through your urethra into your bladder.  A salt-water solution will flow through the ureteroscope to fill your bladder. This will help the health care provider see the openings of your ureters more clearly.  Then, the ureteroscope will be passed into your ureter. ? If a growth is found, a piece of it may be removed so it can be examined under a microscope (biopsy). ? If a stone is found, it may be  removed through the ureteroscope, or the stone may be broken up using a laser, shock waves, or electrical energy. ? In some cases, if the ureter is too small, a tube may be inserted that keeps the ureter open (ureteral stent). The stent may be left in place for 1 or 2 weeks to keep the ureter open, and then the ureteroscopy procedure will be performed.  The scope will be removed, and your bladder will be emptied. The procedure may vary among health care providers and hospitals. What happens after the procedure?  Your blood pressure, heart rate, breathing rate, and blood oxygen level will be monitored until the medicines you were given have worn off.  You may be asked to urinate.  Donot drive for 24 hours if you were given a sedative. This information is not intended to replace advice given to you by your health care provider. Make sure you discuss any questions you have with your health care provider. Document Revised: 02/13/2017 Document Reviewed: 12/14/2015 Elsevier Patient Education  2020 [employer] After These instructions provide you with information about caring for yourself after your procedure. Your health care provider may also give you more specific instructions. Your treatment has been planned according to current medical practices, but problems sometimes occur. Call your health care provider if you have any problems or questions after your procedure. What can I expect after the procedure? After your procedure, you may:  Feel sleepy for several hours.  Feel clumsy and have poor balance for several hours.  Feel forgetful about what happened after the procedure.  Have poor judgment for several  hours.  Feel nauseous or vomit.  Have a sore throat if you had a breathing tube during the procedure. Follow these instructions at home: For at least 24 hours after the procedure:      Have a responsible adult stay with you. It is important to  have someone help care for you until you are awake and alert.  Rest as needed.  Do not: ? Participate in activities in which you could fall or become injured. ? Drive. ? Use heavy machinery. ? Drink alcohol. ? Take sleeping pills or medicines that cause drowsiness. ? Make important decisions or sign legal documents. ? Take care of children on your own. Eating and drinking  Follow the diet that is recommended by your health care provider.  If you vomit, drink water, juice, or soup when you can drink without vomiting.  Make sure you have little or no nausea before eating solid foods. General instructions  Take over-the-counter and prescription medicines only as told by your health care provider.  If you have sleep apnea, surgery and certain medicines can increase your risk for breathing problems. Follow instructions from your health care provider about wearing your sleep device: ? Anytime you are sleeping, including during daytime naps. ? While taking prescription pain medicines, sleeping medicines, or medicines that make you drowsy.  If you smoke, do not smoke without supervision.  Keep all follow-up visits as told by your health care provider. This is important. Contact a health care provider if:  You keep feeling nauseous or you keep vomiting.  You feel light-headed.  You develop a rash.  You have a fever. Get help right away if:  You have trouble breathing. Summary  For several hours after your procedure, you may feel sleepy and have poor judgment.  Have a responsible adult stay with you for at least 24 hours or until you are awake and alert. This information is not intended to replace advice given to you by your health care provider. Make sure you discuss any questions you have with your health care provider. Document Revised: 06/01/2017 Document Reviewed: 06/24/2015 Elsevier Patient Education  [place].

## 2019-11-16 ENCOUNTER — Telehealth: Payer: Self-pay | Admitting: *Deleted

## 2019-11-16 ENCOUNTER — Other Ambulatory Visit: Payer: Self-pay | Admitting: *Deleted

## 2019-11-16 MED ORDER — LISINOPRIL 5 MG PO TABS: 5.0000 mg | ORAL_TABLET | Freq: Every day | ORAL | 2 refills | Status: DC

## 2019-11-16 NOTE — Telephone Encounter (Signed)
[name] called and she was going to be discharged yesterday pm from a hospital in [place] where she has been for a week.  I am not sure she will be coming to her 11/18/19 appt with [name].

## 2019-11-17 ENCOUNTER — Ambulatory Visit (HOSPITAL_COMMUNITY): Admission: RE | Admit: 2019-11-17 | Payer: Medicare Other | Source: Home / Self Care | Admitting: Urology

## 2019-11-17 ENCOUNTER — Encounter (HOSPITAL_COMMUNITY): Admission: RE | Payer: Self-pay | Source: Home / Self Care

## 2019-11-17 DIAGNOSIS — N200 Calculus of kidney: Secondary | ICD-10-CM

## 2019-11-17 SURGERY — CYSTOURETEROSCOPY, WITH RETROGRADE PYELOGRAM AND STENT INSERTION
Anesthesia: General | Laterality: Right

## 2019-11-18 ENCOUNTER — Other Ambulatory Visit: Payer: Self-pay

## 2019-11-18 ENCOUNTER — Encounter: Payer: Medicare Other | Attending: Registered Nurse | Admitting: Registered Nurse

## 2019-11-18 VITALS — BP 124/64 | Ht 64.0 in | Wt 227.0 lb

## 2019-11-18 DIAGNOSIS — M546 Pain in thoracic spine: Secondary | ICD-10-CM | POA: Diagnosis not present

## 2019-11-18 DIAGNOSIS — M25562 Pain in left knee: Secondary | ICD-10-CM | POA: Insufficient documentation

## 2019-11-18 DIAGNOSIS — Z955 Presence of coronary angioplasty implant and graft: Secondary | ICD-10-CM | POA: Insufficient documentation

## 2019-11-18 DIAGNOSIS — Z8249 Family history of ischemic heart disease and other diseases of the circulatory system: Secondary | ICD-10-CM | POA: Insufficient documentation

## 2019-11-18 DIAGNOSIS — J449 Chronic obstructive pulmonary disease, unspecified: Secondary | ICD-10-CM | POA: Insufficient documentation

## 2019-11-18 DIAGNOSIS — I110 Hypertensive heart disease with heart failure: Secondary | ICD-10-CM | POA: Insufficient documentation

## 2019-11-18 DIAGNOSIS — I509 Heart failure, unspecified: Secondary | ICD-10-CM | POA: Insufficient documentation

## 2019-11-18 DIAGNOSIS — M25512 Pain in left shoulder: Secondary | ICD-10-CM | POA: Insufficient documentation

## 2019-11-18 DIAGNOSIS — K219 Gastro-esophageal reflux disease without esophagitis: Secondary | ICD-10-CM | POA: Insufficient documentation

## 2019-11-18 DIAGNOSIS — M1712 Unilateral primary osteoarthritis, left knee: Secondary | ICD-10-CM

## 2019-11-18 DIAGNOSIS — M5412 Radiculopathy, cervical region: Secondary | ICD-10-CM

## 2019-11-18 DIAGNOSIS — Z823 Family history of stroke: Secondary | ICD-10-CM | POA: Insufficient documentation

## 2019-11-18 DIAGNOSIS — M1711 Unilateral primary osteoarthritis, right knee: Secondary | ICD-10-CM

## 2019-11-18 DIAGNOSIS — F329 Major depressive disorder, single episode, unspecified: Secondary | ICD-10-CM | POA: Insufficient documentation

## 2019-11-18 DIAGNOSIS — G4700 Insomnia, unspecified: Secondary | ICD-10-CM | POA: Insufficient documentation

## 2019-11-18 DIAGNOSIS — M545 Low back pain: Secondary | ICD-10-CM | POA: Insufficient documentation

## 2019-11-18 DIAGNOSIS — M542 Cervicalgia: Secondary | ICD-10-CM | POA: Diagnosis not present

## 2019-11-18 DIAGNOSIS — Z5181 Encounter for therapeutic drug level monitoring: Secondary | ICD-10-CM | POA: Insufficient documentation

## 2019-11-18 DIAGNOSIS — I252 Old myocardial infarction: Secondary | ICD-10-CM | POA: Insufficient documentation

## 2019-11-18 DIAGNOSIS — E1142 Type 2 diabetes mellitus with diabetic polyneuropathy: Secondary | ICD-10-CM | POA: Insufficient documentation

## 2019-11-18 DIAGNOSIS — F419 Anxiety disorder, unspecified: Secondary | ICD-10-CM | POA: Insufficient documentation

## 2019-11-18 DIAGNOSIS — G8929 Other chronic pain: Secondary | ICD-10-CM | POA: Insufficient documentation

## 2019-11-18 DIAGNOSIS — M7061 Trochanteric bursitis, right hip: Secondary | ICD-10-CM | POA: Insufficient documentation

## 2019-11-18 DIAGNOSIS — M4726 Other spondylosis with radiculopathy, lumbar region: Secondary | ICD-10-CM | POA: Insufficient documentation

## 2019-11-18 DIAGNOSIS — M5416 Radiculopathy, lumbar region: Secondary | ICD-10-CM

## 2019-11-18 DIAGNOSIS — I2510 Atherosclerotic heart disease of native coronary artery without angina pectoris: Secondary | ICD-10-CM

## 2019-11-18 DIAGNOSIS — M48062 Spinal stenosis, lumbar region with neurogenic claudication: Secondary | ICD-10-CM

## 2019-11-18 DIAGNOSIS — E785 Hyperlipidemia, unspecified: Secondary | ICD-10-CM | POA: Insufficient documentation

## 2019-11-18 DIAGNOSIS — G4733 Obstructive sleep apnea (adult) (pediatric): Secondary | ICD-10-CM | POA: Insufficient documentation

## 2019-11-18 DIAGNOSIS — M25561 Pain in right knee: Secondary | ICD-10-CM | POA: Insufficient documentation

## 2019-11-18 DIAGNOSIS — G894 Chronic pain syndrome: Secondary | ICD-10-CM

## 2019-11-18 DIAGNOSIS — M7062 Trochanteric bursitis, left hip: Secondary | ICD-10-CM

## 2019-11-18 DIAGNOSIS — Z79891 Long term (current) use of opiate analgesic: Secondary | ICD-10-CM

## 2019-11-18 MED ORDER — MORPHINE SULFATE ER 30 MG PO TBCR: 30.0000 mg | EXTENDED_RELEASE_TABLET | Freq: Two times a day (BID) | ORAL | 0 refills | Status: DC

## 2019-11-18 MED ORDER — MORPHINE SULFATE 15 MG PO TABS: 15.0000 mg | ORAL_TABLET | Freq: Every day | ORAL | 0 refills | Status: DC | PRN

## 2019-11-18 NOTE — Progress Notes (Signed)
Subjective:    Patient ID: [name], female    DOB: 1950/10/29, 69 y.o.   MRN: [id number]  HPI: [name] is a 69 y.o. female whose appointment was changed d to a virtual office visit to reduce the risk of exposure to the COVID-19 virus and to help [name] remain healthy and safe. The virtual visit will also provide continuity of care. [name] understanding and agrees with the tele-health visit. She states her pain is usually  located in her neck radiaiting into her bilateral shoulders, upper- lower back radiating into her bilateral lower extremities and bilateral hip pain. Also reports bilateral feet pain with tingling and numbness, she states she had her medication this morning and her pain is intense in her bilateral feet at this time. She rates her pain 4. Her current exercise regime is walking short distances in her home with her walker.   [name] was admitted to [facility] on 11/10/2019, she was a transfer from [facility] for urosepsis with obstructing right ureteral calculus and discharged on 11/15/2019, for Ureterolithiasis and UTI. S/P Cystourethroscopy with insertion of urethral stent. Discharge Summary was Reviewed.  [name] Morphine equivalent is 75.00  MME. She is also prescribed Lorazepam by Noemi Chapel. We have discussed the black box warning of using opioids and benzodiazepines. I highlighted the dangers of using these drugs together and discussed the adverse events including respiratory suppression, overdose, cognitive impairment and importance of compliance with current regimen. We will continue to monitor and adjust as indicated.  She  is being closely monitored and under the care of her psychiatrist   Dr. Reece Levy   Pain Inventory Average Pain 5 Pain Right Now 4 My pain is constant, burning and tingling  In the last 24 hours, has pain interfered with the following? General activity 4 Relation with others 4 Enjoyment of life  4 What TIME of day is your pain at its worst? morning , daytime, evening and night Sleep (in general) Good  Pain is worse with: walking, bending, sitting, standing and some activites Pain improves with: medication Relief from Meds: 8  Family History  Problem Relation Age of Onset  . Heart attack Mother   . Colon cancer Mother   . Heart attack Father   . Stroke Sister   . Hemophilia Child   . Cancer Other   . Coronary artery disease Other   . Cancer Brother   . Cancer Maternal Aunt   . Cancer Maternal Uncle   . Cancer Paternal Aunt   . Cancer Paternal Uncle   . Cancer Brother    Social History   Socioeconomic History  . Marital status: Divorced    Spouse name: Not on file  . Number of children: 4  . Years of education: Not on file  . Highest education level: Not on file  Occupational History  . Occupation: Disabled  Tobacco Use  . Smoking status: Never Smoker  . Smokeless tobacco: Never Used  Vaping Use  . Vaping Use: Never used  Substance and Sexual Activity  . Alcohol use: No    Alcohol/week: 0.0 standard drinks  . Drug use: No  . Sexual activity: Not on file  Other Topics Concern  . Not on file  Social History Narrative   Patient does not get regular exercise   Denies caffeine use    Social Determinants of Health   Financial Resource Strain:   . Difficulty of Paying Living Expenses: Not  on file  Food Insecurity:   . Worried About Charity fundraiser in the Last Year: Not on file  . Ran Out of Food in the Last Year: Not on file  Transportation Needs:   . Lack of Transportation (Medical): Not on file  . Lack of Transportation (Non-Medical): Not on file  Physical Activity:   . Days of Exercise per Week: Not on file  . Minutes of Exercise per Session: Not on file  Stress:   . Feeling of Stress : Not on file  Social Connections:   . Frequency of Communication with Friends and Family: Not on file  . Frequency of Social Gatherings with Friends and Family:  Not on file  . Attends Religious Services: Not on file  . Active Member of Clubs or Organizations: Not on file  . Attends Archivist Meetings: Not on file  . Marital Status: Not on file   Past Surgical History:  Procedure Laterality Date  . ABDOMINAL HYSTERECTOMY    . Arm Surgery Bilateral    due to fall-pt broke both forearms  . BIOPSY N/A 07/01/2013   Procedure: BIOPSY;  Surgeon: Rogene Houston, MD;  Location: [facility];  Service: Endoscopy;  Laterality: N/A;  . CHOLECYSTECTOMY    . COLONOSCOPY  08/06/2011   Procedure: COLONOSCOPY;  Surgeon: Rogene Houston, MD;  [facility];  Service: Endoscopy;  Laterality: N/A;  215  . COLONOSCOPY WITH PROPOFOL N/A 12/31/2012   Procedure: COLONOSCOPY WITH PROPOFOL;  Surgeon: Rogene Houston, MD;  Location: [facility];  Service: Endoscopy;  Laterality: N/A;  in cecum at 0814, total withdrawal time 53mn  . COLONOSCOPY WITH PROPOFOL N/A 02/05/2018   Procedure: COLONOSCOPY WITH PROPOFOL;  Surgeon: RRogene Houston MD;  [facility];  Service: Endoscopy;  Laterality: N/A;  1:25  . CORONARY ANGIOPLASTY WITH STENT PLACEMENT    . ESOPHAGEAL DILATION N/A 06/07/2015   Procedure: ESOPHAGEAL DILATION;  Surgeon: NRogene Houston MD;  [facility];  Service: Endoscopy;  Laterality: N/A;  . ESOPHAGOGASTRODUODENOSCOPY N/A 06/07/2015   Procedure: ESOPHAGOGASTRODUODENOSCOPY (EGD);  Surgeon: NRogene Houston MD;  [facility];  Service: Endoscopy;  Laterality: N/A;  2:45 - moved to 1:00 - Ann to notify pt  . ESOPHAGOGASTRODUODENOSCOPY (EGD) WITH PROPOFOL N/A 12/31/2012   Procedure: ESOPHAGOGASTRODUODENOSCOPY (EGD) WITH PROPOFOL;  Surgeon: NRogene Houston MD;  [facility];  Service: Endoscopy;  Laterality: N/A;  GE junction 37,  . ESOPHAGOGASTRODUODENOSCOPY (EGD) WITH PROPOFOL N/A 07/01/2013   Procedure: ESOPHAGOGASTRODUODENOSCOPY (EGD) WITH PROPOFOL;  Surgeon: NRogene Houston MD;  [facility];  Service:  Endoscopy;  Laterality: N/A;  950  . MALONEY DILATION N/A 12/31/2012   Procedure: MALONEY DILATION;  Surgeon: NRogene Houston MD;  [facility];  Service: Endoscopy;  Laterality: N/A;  used a # 54,#56  . MALONEY DILATION N/A 07/01/2013   Procedure: MVenia MinksDILATION;  Surgeon: NRogene Houston MD;  [facility];  Service: Endoscopy;  Laterality: N/A;  54/58; no heme present  . POLYPECTOMY  02/05/2018   Procedure: POLYPECTOMY;  Surgeon: RRogene Houston MD;  [facility];  Service: Endoscopy;;  distal rectum (HS)   Past Surgical History:  Procedure Laterality Date  . ABDOMINAL HYSTERECTOMY    . Arm Surgery Bilateral    due to fall-pt broke both forearms  . BIOPSY N/A 07/01/2013   Procedure: BIOPSY;  Surgeon: NRogene Houston MD;  [facility];  Service:  Endoscopy;  Laterality: N/A;  . CHOLECYSTECTOMY    . COLONOSCOPY  08/06/2011   Procedure: COLONOSCOPY;  Surgeon: Rogene Houston, MD;  [facility];  Service: Endoscopy;  Laterality: N/A;  215  . COLONOSCOPY WITH PROPOFOL N/A 12/31/2012   Procedure: COLONOSCOPY WITH PROPOFOL;  Surgeon: Rogene Houston, MD;  [facility];  Service: Endoscopy;  Laterality: N/A;  in cecum at 0814, total withdrawal time 45mn  . COLONOSCOPY WITH PROPOFOL N/A 02/05/2018   Procedure: COLONOSCOPY WITH PROPOFOL;  Surgeon: RRogene Houston MD;  [facility];  Service: Endoscopy;  Laterality: N/A;  1:25  . CORONARY ANGIOPLASTY WITH STENT PLACEMENT    . ESOPHAGEAL DILATION N/A 06/07/2015   Procedure: ESOPHAGEAL DILATION;  Surgeon: NRogene Houston MD;  [facility];  Service: Endoscopy;  Laterality: N/A;  . ESOPHAGOGASTRODUODENOSCOPY N/A 06/07/2015   Procedure: ESOPHAGOGASTRODUODENOSCOPY (EGD);  Surgeon: NRogene Houston MD;  [facility];  Service: Endoscopy;  Laterality: N/A;  2:45 - moved to 1:00 - Ann to notify pt  . ESOPHAGOGASTRODUODENOSCOPY (EGD) WITH PROPOFOL N/A 12/31/2012   Procedure:  ESOPHAGOGASTRODUODENOSCOPY (EGD) WITH PROPOFOL;  Surgeon: NRogene Houston MD;  [facility];  Service: Endoscopy;  Laterality: N/A;  GE junction 37,  . ESOPHAGOGASTRODUODENOSCOPY (EGD) WITH PROPOFOL N/A 07/01/2013   Procedure: ESOPHAGOGASTRODUODENOSCOPY (EGD) WITH PROPOFOL;  Surgeon: NRogene Houston MD;  [facility];  Service: Endoscopy;  Laterality: N/A;  950  . MALONEY DILATION N/A 12/31/2012   Procedure: MALONEY DILATION;  Surgeon: NRogene Houston MD;  [facility];  Service: Endoscopy;  Laterality: N/A;  used a # 54,#56  . MALONEY DILATION N/A 07/01/2013   Procedure: MVenia MinksDILATION;  Surgeon: NRogene Houston MD;  [facility];  Service: Endoscopy;  Laterality: N/A;  54/58; no heme present  . POLYPECTOMY  02/05/2018   Procedure: POLYPECTOMY;  Surgeon: RRogene Houston MD;  [facility];  Service: Endoscopy;;  distal rectum (HS)   Past Medical History:  Diagnosis Date  . Anxiety and depression   . Arthritis   . CAD (coronary artery disease)    Stent placement circumflex coronary 2007, catheterization 2008 patent stents. Normal LV function  . Chest pain   . CHF (congestive heart failure) ([place]   . COPD (chronic obstructive pulmonary disease) (HCC)    no home O2  . Depression   . Diabetes mellitus    Insulin dependent  . Diabetic polyneuropathy (HCC)     severe on multiple medications  . Dyslipidemia   . GERD (gastroesophageal reflux disease)   . Headache(784.0)   . Heart attack ([place]   . Heartburn   . 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 High cholesterol   . Hx of blood clots   . Hypertension   . [place]    [place]  . Obstructive sleep apnea    CPAP, setting ?2  . Palpitations   . Sleep apnea   . Tachycardia    There were no vitals taken for this visit.  Opioid Risk Score:   Fall Risk Score:  `1  Depression screen PHQ 2/9  [facility] 11/18/2019 10/26/2019 09/26/2019 03/24/2019 07/16/2018 06/12/2017 04/14/2017    Decreased Interest 1 0 0 1 1 3 3   Down, Depressed, Hopeless 1 0 0 1 1 3 3   PHQ - 2 Score 2 0 0 2 2 6 6   Altered sleeping - - - - - 3 -  Tired,  decreased energy - - - - - 3 -  Change in appetite - - - - - 0 -  Feeling bad or failure about yourself  - - - - - 3 -  Trouble concentrating - - - - - 3 -  Moving slowly or fidgety/restless - - - - - 1 -  Suicidal thoughts - - - - - 0 -  PHQ-9 Score - - - - - 19 -  Difficult doing work/chores - - - - - Very difficult -  Some recent data might be hidden     Review of Systems  Musculoskeletal: Positive for gait problem.       Pain in feet  Neurological: Positive for tremors, weakness and numbness.       Tingling  All other systems reviewed and are negative.      Objective:   Physical Exam Vitals and nursing note reviewed.  Musculoskeletal:     Comments: No Physical Exam Performed: Tele-Health Visit           Assessment & Plan:  1.Lumbar pain lumbar spondylosis: Encouraged to continue toincrease Activity as tolerated.11/18/2019. Continue current medication regimen.  Refilled: MSIR 15 mg one tablet daily as needed for breakthrough pain and MS Contin 30 MG one tablet every12hours #60. We will continue the opioid monitoring program, this consists of regular clinic visits, examinations, urine drug screen, pill counts as well as use of [place] Controlled Substance Reporting system. A 12 month History has been reviewed on the [place] on 11/18/2019.  2. Lumbar Radiculitis: Continuecurrent medication regimen withGabapentin.11/18/2019 3. Bilateral Knee Pain/ Degenerative:Continue Voltaren Gel. Continue to Monitor.11/18/2019 4. Severe diabetic poly neuropathy: Continuecurrent medication regimen withGabapentin.11/18/2019 5. Insomnia: ContinueTrazodone Psychiatry Following.11/18/2019 6.Anxiety/Depression:Lorazepam,ProzacandHydroxyzine Prescribed  byPsychiatry.11/18/2019 7. De Quervains Tenosynovitis: No Complaints Today.11/18/2019 8.BilateralGreater Trochanteric Bursitis:Continue with Ice and Heat Therapy. Continue to Monitor.11/18/2019. 9. Cervicalgia/ Cervical Radiculitis:Continue Gabapentin.Continue with HEP as Tolerated and continue to monitor.11/18/2019 10. Chronic Bilateral Thoracic Back Pain: Continue HEP as Tolerated. Continue current medication regimen. Continue to monitor.11/18/2019.    F/U in 1 month Tele-Health Visit Telephone Call  Established Patient Location of Patient: In her Home Location of Provider: In the Office Total Time Spent: 10 Minutes

## 2019-11-20 ENCOUNTER — Encounter: Payer: Self-pay | Admitting: Registered Nurse

## 2019-11-23 ENCOUNTER — Other Ambulatory Visit: Payer: Self-pay

## 2019-11-23 ENCOUNTER — Encounter: Payer: Self-pay | Admitting: Urology

## 2019-11-23 ENCOUNTER — Ambulatory Visit (INDEPENDENT_AMBULATORY_CARE_PROVIDER_SITE_OTHER): Payer: Medicare Other | Admitting: Urology

## 2019-11-23 VITALS — BP 111/64 | HR 81 | Temp 98.9°F | Ht 64.0 in | Wt 220.0 lb

## 2019-11-23 DIAGNOSIS — N200 Calculus of kidney: Secondary | ICD-10-CM

## 2019-11-23 DIAGNOSIS — I2510 Atherosclerotic heart disease of native coronary artery without angina pectoris: Secondary | ICD-10-CM | POA: Diagnosis not present

## 2019-11-23 LAB — URINALYSIS, ROUTINE W REFLEX MICROSCOPIC
Bilirubin, UA: NEGATIVE
Glucose, UA: NEGATIVE
Ketones, UA: NEGATIVE
Nitrite, UA: NEGATIVE
Specific Gravity, UA: >1.03 — ABNORMAL HIGH (ref 1.005–1.030)
Urobilinogen, Ur: 0.2 mg/dL (ref 0.2–1.0)
pH, UA: 5.5 (ref 5.0–7.5)

## 2019-11-23 MED ORDER — PROMETHAZINE HCL 25 MG PO TABS: 25.0000 mg | ORAL_TABLET | Freq: Four times a day (QID) | ORAL | 0 refills | Status: DC | PRN

## 2019-11-23 NOTE — Patient Instructions (Signed)
Ureteroscopy Ureteroscopy is a procedure to check for and treat problems inside part of the urinary tract. In this procedure, a thin, tube-shaped instrument with a light at the end (ureteroscope) is used to look at the inside of the kidneys and the ureters, which are the tubes that carry urine from the kidneys to the bladder. The ureteroscope is inserted into one or both of the ureters. You may need this procedure if you have frequent urinary tract infections (UTIs), blood in your urine, or a stone in one of your ureters. A ureteroscopy can be done to find the cause of urine blockage in a ureter and to evaluate other abnormalities inside the ureters or kidneys. If stones are found, they can be removed during the procedure. Polyps, abnormal tissue, and some types of tumors can also be removed or treated. The ureteroscope may also have a tool to remove tissue to be checked for disease under a microscope (biopsy). Tell a health care provider about:  Any allergies you have.  All medicines you are taking, including vitamins, herbs, eye drops, creams, and over-the-counter medicines.  Any problems you or family members have had with anesthetic medicines.  Any blood disorders you have.  Any surgeries you have had.  Any medical conditions you have.  Whether you are pregnant or may be pregnant. What are the risks? Generally, this is a safe procedure. However, problems may occur, including:  Bleeding.  Infection.  Allergic reactions to medicines.  Scarring that narrows the ureter (stricture).  Creating a hole in the ureter (perforation). What happens before the procedure? Staying hydrated Follow instructions from your health care provider about hydration, which may include:  Up to 2 hours before the procedure - you may continue to drink clear liquids, such as water, clear fruit juice, black coffee, and plain tea. Eating and drinking restrictions Follow instructions from your health care  provider about eating and drinking, which may include:  8 hours before the procedure - stop eating heavy meals or foods such as meat, fried foods, or fatty foods.  6 hours before the procedure - stop eating light meals or foods, such as toast or cereal.  6 hours before the procedure - stop drinking milk or drinks that contain milk.  2 hours before the procedure - stop drinking clear liquids. Medicines  Ask your health care provider about: ? Changing or stopping your regular medicines. This is especially important if you are taking diabetes medicines or blood thinners. ? Taking medicines such as aspirin and ibuprofen. These medicines can thin your blood. Do not take these medicines before your procedure if your health care provider instructs you not to.  You may be given antibiotic medicine to help prevent infection. General instructions  You may have a urine sample taken to check for infection.  Plan to have someone take you home from the hospital or clinic. What happens during the procedure?   To reduce your risk of infection: ? Your health care team will wash or sanitize their hands. ? Your skin will be washed with soap.  An IV tube will be inserted into one of your veins.  You will be given one of the following: ? A medicine to help you relax (sedative). ? A medicine to make you fall asleep (general anesthetic). ? A medicine that is injected into your spine to numb the area below and slightly above the injection site (spinal anesthetic).  To lower your risk of infection, you may be given an antibiotic medicine  by an injection or through the IV tube.  The opening from which you urinate (urethra) will be cleaned with a germ-killing solution.  The ureteroscope will be passed through your urethra into your bladder.  A salt-water solution will flow through the ureteroscope to fill your bladder. This will help the health care provider see the openings of your ureters more  clearly.  Then, the ureteroscope will be passed into your ureter. ? If a growth is found, a piece of it may be removed so it can be examined under a microscope (biopsy). ? If a stone is found, it may be removed through the ureteroscope, or the stone may be broken up using a laser, shock waves, or electrical energy. ? In some cases, if the ureter is too small, a tube may be inserted that keeps the ureter open (ureteral stent). The stent may be left in place for 1 or 2 weeks to keep the ureter open, and then the ureteroscopy procedure will be performed.  The scope will be removed, and your bladder will be emptied. The procedure may vary among health care providers and hospitals. What happens after the procedure?  Your blood pressure, heart rate, breathing rate, and blood oxygen level will be monitored until the medicines you were given have worn off.  You may be asked to urinate.  Donot drive for 24 hours if you were given a sedative. This information is not intended to replace advice given to you by your health care provider. Make sure you discuss any questions you have with your health care provider. Document Revised: 02/13/2017 Document Reviewed: 12/14/2015 Elsevier Patient Education  2020 [employer].

## 2019-11-23 NOTE — Progress Notes (Signed)
Urological Symptom Review  Patient is experiencing the following symptoms: Get up at night to urinate Kidney stone Review of Systems  Gastrointestinal (upper)  : Nausea  Gastrointestinal (lower) : Negative for lower GI symptoms  Constitutional : Fatigue  Skin: Negative for skin symptoms  Eyes: Blurred vision  Ear/Nose/Throat : Negative for Ear/Nose/Throat symptoms  Hematologic/Lymphatic: Negative for Hematologic/Lymphatic symptoms  Cardiovascular : Leg swelling  Respiratory : Shortness of breath  Endocrine: Excessive thirst  Musculoskeletal: Back pain Joint pain  Neurological: Headaches  Psychologic: Depression

## 2019-11-23 NOTE — Progress Notes (Signed)
11/23/2019 10:38 AM   [name] [birth year] [id number]  Referring provider: Glenda Chroman, MD [place],  [place] [id number]  Nephrolithiasis  HPI: [name] is a [phone] here for followup for nephrolithiasis. She was admitted to [facility] 2 weeks ago with sepsis from her right ureteral and had a stent placed. She has intermittent right flank pain with associated nausea. Nop hematuria. No fevers.   PMH: Past Medical History:  Diagnosis Date   Anxiety and depression    Arthritis    CAD (coronary artery disease)    Stent placement circumflex coronary 2007, catheterization 2008 patent stents. 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    High cholesterol    Hx of blood clots    Hypertension    MI (myocardial infarction) ([place])    2007   Obstructive sleep apnea    CPAP, setting ?2   Palpitations    Sleep apnea    Tachycardia     Surgical History: Past Surgical History:  Procedure Laterality Date   ABDOMINAL HYSTERECTOMY     Arm Surgery Bilateral    due to fall-pt broke both forearms   BIOPSY N/A 07/01/2013   Procedure: BIOPSY;  Surgeon: Rogene Houston, MD;  Location: [facility];  Service: Endoscopy;  Laterality: N/A;   CHOLECYSTECTOMY     COLONOSCOPY  08/06/2011   Procedure: COLONOSCOPY;  Surgeon: Rogene Houston, MD;  Location: [facility];  Service: Endoscopy;  Laterality: N/A;  215   COLONOSCOPY WITH PROPOFOL N/A 12/31/2012   Procedure: COLONOSCOPY WITH PROPOFOL;  Surgeon: Rogene Houston, MD;  Location: [facility];  Service: Endoscopy;  Laterality: N/A;  in cecum at 0814, total  withdrawal time 70mn   COLONOSCOPY WITH PROPOFOL N/A 02/05/2018   Procedure: COLONOSCOPY WITH PROPOFOL;  Surgeon: RRogene Houston MD;  [facility];  Service: Endoscopy;  Laterality: N/A;  1:25   CORONARY ANGIOPLASTY WITH STENT PLACEMENT     ESOPHAGEAL DILATION N/A 06/07/2015   Procedure: ESOPHAGEAL DILATION;  Surgeon: NRogene Houston MD;  [facility];  Service: Endoscopy;  Laterality: N/A;   ESOPHAGOGASTRODUODENOSCOPY N/A 06/07/2015   Procedure: ESOPHAGOGASTRODUODENOSCOPY (EGD);  Surgeon: NRogene Houston MD;  [facility];  Service: Endoscopy;  Laterality: N/A;  2:45 - moved to 1:00 - Ann to notify pt   ESOPHAGOGASTRODUODENOSCOPY (EGD) WITH PROPOFOL N/A 12/31/2012   Procedure: ESOPHAGOGASTRODUODENOSCOPY (EGD) WITH PROPOFOL;  Surgeon: NRogene Houston MD;  Location: [facility];  Service: Endoscopy;  Laterality: N/A;  GE junction 37,   ESOPHAGOGASTRODUODENOSCOPY (EGD) WITH PROPOFOL N/A 07/01/2013   Procedure: ESOPHAGOGASTRODUODENOSCOPY (EGD) WITH PROPOFOL;  Surgeon: NRogene Houston MD;  Location: [facility];  Service: Endoscopy;  Laterality: N/A;  [place] 12/31/2012   Procedure: MALONEY DILATION;  Surgeon: NRogene Houston MD;  Location: [facility];  Service: Endoscopy;  Laterality: N/A;  used a # 54,#56   MALONEY DILATION N/A 07/01/2013   Procedure: MVenia MinksDILATION;  Surgeon: NRogene Houston MD;  Location:  [facility];  Service: Endoscopy;  Laterality: N/A;  54/58; no heme present   POLYPECTOMY  02/05/2018   Procedure: POLYPECTOMY;  Surgeon: Rogene Houston, MD;  [facility];  Service: Endoscopy;;  distal rectum (HS)    Home Medications:  Allergies as of 11/23/2019      Reactions   Amphetamine-dextroamphetamine Swelling   Nitrofuran Derivatives Itching, Swelling   Amphetamine-dextroamphet Er Swelling   Pregabalin Swelling   Topiramate Other (See Comments)   Tongue tingle   Verelan [verapamil] Rash      Medication List         Accurate as of November 23, 2019 10:38 AM. If you have any questions, ask your nurse or doctor.        amLODipine-valsartan 10-160 MG tablet Commonly known as: EXFORGE Take 1 tablet by mouth daily.   Armodafinil 150 MG tablet Take 150 mg by mouth daily.   aspirin EC 81 MG tablet Take 1 tablet (81 mg total) by mouth daily.   atorvastatin 40 MG tablet Commonly known as: LIPITOR Take 40 mg by mouth daily.   B-D ULTRAFINE III SHORT PEN 31G X 8 MM Misc Generic drug: Insulin Pen Needle Inject into the skin 4 (four) times daily.   buPROPion 150 MG 24 hr tablet Commonly known as: WELLBUTRIN XL Take 150-300 mg by mouth See admin instructions. Take 300 mg by mouth in the morning and take 150 mg by mouth at noon   buPROPion 100 MG 12 hr tablet Commonly known as: WELLBUTRIN SR Take 100 mg by mouth daily.   desvenlafaxine 100 MG 24 hr tablet Commonly known as: PRISTIQ Take by mouth.   diclofenac Sodium 1 % Gel Commonly known as: VOLTAREN APPLY TO BOTH KNEES THREE TIMES DAILY What changed: See the new instructions.   doxepin 25 MG capsule Commonly known as: SINEQUAN TAKE ONE TO THREE CAPSULES 30 MINUTES BEFORE BEDTIME   estradiol 0.5 MG tablet Commonly known as: ESTRACE Take 0.5 mg by mouth daily.   FLUoxetine 20 MG capsule Commonly known as: PROZAC Take 20 mg by mouth 2 (two) times daily.   furosemide 20 MG tablet Commonly known as: LASIX Take 1 tablet by mouth daily as needed.   gabapentin 400 MG capsule Commonly known as: NEURONTIN Take 400 mg by mouth 3 (three) times daily.   glipiZIDE 5 MG 24 hr tablet Commonly known as: GLUCOTROL XL TAKE 1 TABLET BY MOUTH EVERY DAY WITH BREAKFAST   hydrOXYzine 10 MG tablet Commonly known as: ATARAX/VISTARIL Take 10 mg by mouth 4 (four) times daily as needed for anxiety.   lidocaine 5 % ointment Commonly known as: XYLOCAINE Apply 1 application topically at bedtime.   lidocaine-hydrocortisone 3-0.5 % Crea Commonly  known as: ANAMANTEL HC Place 1 Applicatorful rectally 2 (two) times daily for 7 days.   lisinopril 5 MG tablet Commonly known as: ZESTRIL Take 1 tablet (5 mg total) by mouth daily.   loperamide 2 MG capsule Commonly known as: IMODIUM   LORazepam 0.5 MG tablet Commonly known as: ATIVAN Take 0.5 mg by mouth 2 (two) times daily as needed for anxiety.   meloxicam 7.5 MG tablet Commonly known as: MOBIC Take 1 tablet (7.5 mg total) by mouth daily.   metFORMIN 500 MG tablet Commonly known as: GLUCOPHAGE TAKE 1 TABLET BY MOUTH TWICE DAILY   methocarbamol 500 MG tablet Commonly known as: ROBAXIN Take 1 tablet (500 mg total) by mouth 3 (three) times daily. What changed:   when to take  this  reasons to take this   metoCLOPramide 5 MG tablet Commonly known as: REGLAN Take 5 mg by mouth 3 (three) times daily as needed for nausea or vomiting.   metoprolol succinate 50 MG 24 hr tablet Commonly known as: TOPROL-XL TAKE 1 AND 1/2 TABLETS BY MOUTH EVERY DAY   montelukast 10 MG tablet Commonly known as: SINGULAIR Take by mouth.   morphine 30 MG 12 hr tablet Commonly known as: Emily CONTIN Take 1 tablet (30 mg total) by mouth every 12 (twelve) hours.   morphine 15 MG tablet Commonly known as: MSIR Take 1 tablet (15 mg total) by mouth daily as needed for severe pain. Do Not Fill Before 11/25/2019   nitroGLYCERIN 0.4 MG SL tablet Commonly known as: NITROSTAT Place 1 tablet (0.4 mg total) under the tongue every 5 (five) minutes x 3 doses as needed. For chest pains   ondansetron 4 MG tablet Commonly known as: ZOFRAN Take 4 mg by mouth every 6 (six) hours as needed for nausea or vomiting.   pantoprazole 40 MG tablet Commonly known as: PROTONIX TAKE 1 TABLET BY MOUTH TWICE DAILY BEFORE MEALS   phentermine 37.5 MG tablet Commonly known as: ADIPEX-P Take by mouth.   polyethylene glycol powder 17 GM/SCOOP powder Commonly known as: GLYCOLAX/MIRALAX MIX 1 CAPFUL OF POWDER (17 GM)  IN 8 OZ OF JUICE OR WATER AND DRINK BY MOUTH DAILY IN THE EVENING   potassium chloride 10 MEQ tablet Commonly known as: KLOR-CON Take 10 mEq by mouth 2 (two) times daily.   ProAir HFA 108 (90 Base) MCG/ACT inhaler Generic drug: albuterol Inhale 2 puffs into the lungs every 4 (four) hours as needed for wheezing or shortness of breath.   promethazine 25 MG tablet Commonly known as: PHENERGAN Take 1 tablet (25 mg total) by mouth 4 (four) times daily as needed.   sucralfate 1 g tablet Commonly known as: Carafate Take 1 tablet (1 g total) by mouth 4 (four) times daily -  with meals and at bedtime.   topiramate 200 MG tablet Commonly known as: TOPAMAX Take 200 mg by mouth at bedtime.   Tyler Aas FlexTouch 100 UNIT/ML FlexTouch Pen Generic drug: insulin degludec INJECT 60 UNITS INTO THE SKIN AT BEDTIME   Vitamin D3 125 MCG (5000 UT) Caps TAKE ONE CAPSULE BY MOUTH DAILY   zolpidem 10 MG tablet Commonly known as: AMBIEN Take 10 mg by mouth at bedtime.       Allergies:  Allergies  Allergen Reactions   Amphetamine-Dextroamphetamine Swelling   Nitrofuran Derivatives Itching and Swelling   Amphetamine-Dextroamphet Er Swelling   Pregabalin Swelling   Topiramate Other (See Comments)    Tongue tingle   Verelan [Verapamil] Rash    Family History: Family History  Problem Relation Age of Onset   Heart attack Mother    Colon cancer Mother    Heart attack Father    Stroke Sister    Hemophilia Child    Cancer Other    Coronary artery disease Other    Cancer Brother    Cancer Maternal Aunt    Cancer Maternal Uncle    Cancer Paternal Aunt    Cancer Paternal Uncle    Cancer Brother     Social History:  reports that she has never smoked. She has never used smokeless tobacco. She reports that she does not drink alcohol and does not use drugs.  ROS: All other review of systems were reviewed and are negative except what is noted above in  HPI  Physical  Exam: BP 111/64    Pulse 81    Temp 98.9 F (37.2 C)    Ht 5' 4"  (1.626 m)    Wt 220 lb (99.8 kg)    BMI 37.76 kg/m   Constitutional:  Alert and oriented, No acute distress. HEENT: [place] AT, moist mucus membranes.  Trachea midline, no masses. Cardiovascular: No clubbing, cyanosis, or edema. Respiratory: Normal respiratory effort, no increased work of breathing. GI: Abdomen is soft, nontender, nondistended, no abdominal masses GU: No CVA tenderness.  Lymph: No cervical or inguinal lymphadenopathy. Skin: No rashes, bruises or suspicious lesions. Neurologic: Grossly intact, no focal deficits, moving all 4 extremities. Psychiatric: Normal mood and affect.  Laboratory Data: Lab Results  Component Value Date   WBC 7.8 12/07/2017   HGB 12.6 12/07/2017   HCT 39.6 12/07/2017   MCV 83.4 12/07/2017   PLT 369 12/07/2017    Lab Results  Component Value Date   CREATININE 0.66 10/27/2019    No results found for: PSA  No results found for: TESTOSTERONE  Lab Results  Component Value Date   HGBA1C 9.7 (H) 10/27/2019    Urinalysis    Component Value Date/Time   COLORURINE YELLOW 12/07/2017 1413   APPEARANCEUR Cloudy (A) 10/10/2019 1125   LABSPEC 1.015 12/07/2017 1413   PHURINE 7.0 12/07/2017 1413   GLUCOSEU Negative 10/10/2019 1125   HGBUR NEGATIVE 12/07/2017 1413   BILIRUBINUR Negative 10/10/2019 1125   KETONESUR NEGATIVE 12/07/2017 1413   PROTEINUR Trace (A) 10/10/2019 1125   PROTEINUR NEGATIVE 12/07/2017 1413   UROBILINOGEN 0.2 12/02/2012 1520   NITRITE Negative 10/10/2019 1125   NITRITE NEGATIVE 12/07/2017 1413   LEUKOCYTESUR 3+ (A) 10/10/2019 1125    Lab Results  Component Value Date   LABMICR See below: 10/10/2019   WBCUA >30 (A) 10/10/2019   LABEPIT 0-10 10/10/2019   BACTERIA Moderate (A) 10/10/2019    Pertinent Imaging:  No results found for this or any previous visit.  No results found for this or any previous visit.  No results found for this or any  previous visit.  No results found for this or any previous visit.  No results found for this or any previous visit.  No results found for this or any previous visit.  No results found for this or any previous visit.  Results for orders placed during the hospital encounter of 10/10/19  CT RENAL STONE STUDY  Narrative CLINICAL DATA:  Follow-up renal calculi.  EXAM: CT ABDOMEN AND PELVIS WITHOUT CONTRAST  TECHNIQUE: Multidetector CT imaging of the abdomen and pelvis was performed following the standard protocol without IV contrast.  COMPARISON:  CT scan 08/22/2019  FINDINGS: Lower chest: Streaky scarring changes and atelectasis. No worrisome pulmonary lesions or acute pulmonary findings. No pleural effusion. The heart is normal in size. No pericardial effusion. Coronary artery and aortic calcifications are noted.  Hepatobiliary: No hepatic lesions or intrahepatic biliary dilatation. The gallbladder is surgically absent. No common bile duct dilatation.  Pancreas: No mass, inflammation or ductal dilatation.  Spleen: Normal size. No focal lesions. Evidence of prior splenic infarct. Stable dense calcification in the splenic hilum.  Adrenals/Urinary Tract: Stable 2 cm left adrenal gland nodule measuring 13.5 Hounsfield units and most consistent with a benign adenoma. The right adrenal gland is normal.  The 3.5 mm calculus noted in the right renal pelvis on the prior study is now and a mid to lower pole calyx anteriorly. Another small lower pole calculus is noted. No obstructing  ureteral calculi. No left-sided renal or ureteral calculi. No worrisome renal lesions are seen without contrast. The bladder is unremarkable. It is largely decompressed.  Stomach/Bowel: The stomach, duodenum, small bowel and colon are grossly normal without oral contrast. No acute inflammatory process, mass lesions or obstructive findings. The terminal ileum is normal. The appendix is  normal.  Vascular/Lymphatic: Stable age advanced atherosclerotic calcifications involving the aorta and iliac arteries. No aneurysm. No mesenteric or retroperitoneal mass or adenopathy.  Reproductive: The uterus is surgically absent. Both ovaries are still present and appear normal.  Other: No pelvic mass or adenopathy. No free pelvic fluid collections. No inguinal mass or adenopathy. No abdominal wall hernia or subcutaneous lesions.  Musculoskeletal: No significant bony findings. Moderate lumbar facet disease noted.  IMPRESSION: 1. 3.5 mm right renal calculus is now in a mid to lower pole calyx anteriorly. Another small lower pole calculus is noted. No obstructing ureteral calculi or bladder calculi. 2. No worrisome renal or bladder lesions without contrast. 3. Stable 2 cm left adrenal gland nodule, most consistent with a benign adenoma. 4. Remote splenic infarct. 5. Status post cholecystectomy. No biliary dilatation. 6. Stable age advanced atherosclerotic calcifications involving the aorta and iliac arteries.  Aortic Atherosclerosis (ICD10-I70.0).   Electronically Signed By: Marijo Sanes M.D. On: 10/10/2019 11:52   Assessment & Plan:    1. Nephrolithiasis -patient scheduled for right ureteroscopic stone extraction -rx for phenergan  - Urinalysis, Routine w reflex microscopic   No follow-ups on file.  Nicolette Bang, MD  [facility] [place]

## 2019-11-23 NOTE — H&P (View-Only) (Signed)
11/23/2019 10:38 AM   [name] [birth year] [id number]  Referring provider: Glenda Chroman, MD [place],  [place] [address]  Nephrolithiasis  HPI: [name] is a [phone] here for followup for nephrolithiasis. She was admitted to [facility] 2 weeks ago with sepsis from her right ureteral and had a stent placed. She has intermittent right flank pain with associated nausea. Nop hematuria. No fevers.   PMH: Past Medical History:  Diagnosis Date  . Anxiety and depression   . Arthritis   . CAD (coronary artery disease)    Stent placement circumflex coronary 2007, catheterization 2008 patent stents. Normal LV function  . Chest pain   . CHF (congestive heart failure) ([place])   . COPD (chronic obstructive pulmonary disease) (H[facility])    no home O2  . Depression   . Diabetes mellitus    Insulin dependent  . Diabetic polyneuropathy (H[facility])     severe on multiple medications  . Dyslipidemia   . GERD (gastroesophageal reflux disease)   . Headache(784.0)   . Heart attack ([place])   . Heartburn   . 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 High cholesterol   . Hx of blood clots   . Hypertension   . MI (myocardial infarction) ([place])    2007  . Obstructive sleep apnea    CPAP, setting ?2  . Palpitations   . Sleep apnea   . Tachycardia     Surgical History: Past Surgical History:  Procedure Laterality Date  . ABDOMINAL HYSTERECTOMY    . Arm Surgery Bilateral    due to fall-pt broke both forearms  . BIOPSY N/A 07/01/2013   Procedure: BIOPSY;  Surgeon: Rogene Houston, MD;  Location: [facility];  Service: Endoscopy;  Laterality: N/A;  . CHOLECYSTECTOMY    . COLONOSCOPY  08/06/2011   Procedure: COLONOSCOPY;  Surgeon: Rogene Houston, MD;  [facility]: [facility];  Service: Endoscopy;  Laterality: N/A;  215  . COLONOSCOPY WITH PROPOFOL N/A 12/31/2012   Procedure: COLONOSCOPY WITH PROPOFOL;  Surgeon: Rogene Houston, MD;  Location: [facility];  Service: Endoscopy;  Laterality: N/A;  in cecum at 0814, total  withdrawal time 84mn  . COLONOSCOPY WITH PROPOFOL N/A 02/05/2018   Procedure: COLONOSCOPY WITH PROPOFOL;  Surgeon: RRogene Houston MD;  [facility];  Service: Endoscopy;  Laterality: N/A;  1:25  . CORONARY ANGIOPLASTY WITH STENT PLACEMENT    . ESOPHAGEAL DILATION N/A 06/07/2015   Procedure: ESOPHAGEAL DILATION;  Surgeon: NRogene Houston MD;  [facility];  Service: Endoscopy;  Laterality: N/A;  . ESOPHAGOGASTRODUODENOSCOPY N/A 06/07/2015   Procedure: ESOPHAGOGASTRODUODENOSCOPY (EGD);  Surgeon: NRogene Houston MD;  [facility];  Service: Endoscopy;  Laterality: N/A;  2:45 - moved to 1:00 - Ann to notify pt  . ESOPHAGOGASTRODUODENOSCOPY (EGD) WITH PROPOFOL N/A 12/31/2012   Procedure: ESOPHAGOGASTRODUODENOSCOPY (EGD) WITH PROPOFOL;  Surgeon: NRogene Houston MD;  Location: [facility];  Service: Endoscopy;  Laterality: N/A;  GE junction 37,  . ESOPHAGOGASTRODUODENOSCOPY (EGD) WITH PROPOFOL N/A 07/01/2013   Procedure: ESOPHAGOGASTRODUODENOSCOPY (EGD) WITH PROPOFOL;  Surgeon: NRogene Houston MD;  Location: [facility];  Service: Endoscopy;  Laterality: N/A;  950  . MALONEY DILATION N/A 12/31/2012   Procedure: MALONEY DILATION;  Surgeon: NRogene Houston MD;  Location: [facility];  Service: Endoscopy;  Laterality: N/A;  used a # 54,#56  . MALONEY DILATION N/A 07/01/2013   Procedure: MVenia MinksDILATION;  Surgeon: NRogene Houston MD;  Location:  [facility];  Service: Endoscopy;  Laterality: N/A;  54/58; no heme present  . POLYPECTOMY  02/05/2018   Procedure: POLYPECTOMY;  Surgeon: Rogene Houston, MD;  [facility];  Service: Endoscopy;;  distal rectum (HS)    Home Medications:  Allergies as of 11/23/2019      Reactions   Amphetamine-dextroamphetamine Swelling   Nitrofuran Derivatives Itching, Swelling   Amphetamine-dextroamphet Er Swelling   Pregabalin Swelling   Topiramate Other (See Comments)   Tongue tingle   Verelan [verapamil] Rash      Medication List         Accurate as of November 23, 2019 10:38 AM. If you have any questions, ask your nurse or doctor.        amLODipine-valsartan 10-160 MG tablet Commonly known as: EXFORGE Take 1 tablet by mouth daily.   Armodafinil 150 MG tablet Take 150 mg by mouth daily.   aspirin EC 81 MG tablet Take 1 tablet (81 mg total) by mouth daily.   atorvastatin 40 MG tablet Commonly known as: LIPITOR Take 40 mg by mouth daily.   B-D ULTRAFINE III SHORT PEN 31G X 8 MM Misc Generic drug: Insulin Pen Needle Inject into the skin 4 (four) times daily.   buPROPion 150 MG 24 hr tablet Commonly known as: WELLBUTRIN XL Take 150-300 mg by mouth See admin instructions. Take 300 mg by mouth in the morning and take 150 mg by mouth at noon   buPROPion 100 MG 12 hr tablet Commonly known as: WELLBUTRIN SR Take 100 mg by mouth daily.   desvenlafaxine 100 MG 24 hr tablet Commonly known as: PRISTIQ Take by mouth.   diclofenac Sodium 1 % Gel Commonly known as: VOLTAREN APPLY TO BOTH KNEES THREE TIMES DAILY What changed: See the new instructions.   doxepin 25 MG capsule Commonly known as: SINEQUAN TAKE ONE TO THREE CAPSULES 30 MINUTES BEFORE BEDTIME   estradiol 0.5 MG tablet Commonly known as: ESTRACE Take 0.5 mg by mouth daily.   FLUoxetine 20 MG capsule Commonly known as: PROZAC Take 20 mg by mouth 2 (two) times daily.   furosemide 20 MG tablet Commonly known as: LASIX Take 1 tablet by mouth daily as needed.   gabapentin 400 MG capsule Commonly known as: NEURONTIN Take 400 mg by mouth 3 (three) times daily.   glipiZIDE 5 MG 24 hr tablet Commonly known as: GLUCOTROL XL TAKE 1 TABLET BY MOUTH EVERY DAY WITH BREAKFAST   hydrOXYzine 10 MG tablet Commonly known as: ATARAX/VISTARIL Take 10 mg by mouth 4 (four) times daily as needed for anxiety.   lidocaine 5 % ointment Commonly known as: XYLOCAINE Apply 1 application topically at bedtime.   lidocaine-hydrocortisone 3-0.5 % Crea Commonly  known as: ANAMANTEL HC Place 1 Applicatorful rectally 2 (two) times daily for 7 days.   lisinopril 5 MG tablet Commonly known as: ZESTRIL Take 1 tablet (5 mg total) by mouth daily.   loperamide 2 MG capsule Commonly known as: IMODIUM   LORazepam 0.5 MG tablet Commonly known as: ATIVAN Take 0.5 mg by mouth 2 (two) times daily as needed for anxiety.   meloxicam 7.5 MG tablet Commonly known as: MOBIC Take 1 tablet (7.5 mg total) by mouth daily.   metFORMIN 500 MG tablet Commonly known as: GLUCOPHAGE TAKE 1 TABLET BY MOUTH TWICE DAILY   methocarbamol 500 MG tablet Commonly known as: ROBAXIN Take 1 tablet (500 mg total) by mouth 3 (three) times daily. What changed:   when to take  this  reasons to take this   metoCLOPramide 5 MG tablet Commonly known as: REGLAN Take 5 mg by mouth 3 (three) times daily as needed for nausea or vomiting.   metoprolol succinate 50 MG 24 hr tablet Commonly known as: TOPROL-XL TAKE 1 AND 1/2 TABLETS BY MOUTH EVERY DAY   montelukast 10 MG tablet Commonly known as: SINGULAIR Take by mouth.   morphine 30 MG 12 hr tablet Commonly known as: Emily CONTIN Take 1 tablet (30 mg total) by mouth every 12 (twelve) hours.   morphine 15 MG tablet Commonly known as: MSIR Take 1 tablet (15 mg total) by mouth daily as needed for severe pain. Do Not Fill Before 11/25/2019   nitroGLYCERIN 0.4 MG SL tablet Commonly known as: NITROSTAT Place 1 tablet (0.4 mg total) under the tongue every 5 (five) minutes x 3 doses as needed. For chest pains   ondansetron 4 MG tablet Commonly known as: ZOFRAN Take 4 mg by mouth every 6 (six) hours as needed for nausea or vomiting.   pantoprazole 40 MG tablet Commonly known as: PROTONIX TAKE 1 TABLET BY MOUTH TWICE DAILY BEFORE MEALS   phentermine 37.5 MG tablet Commonly known as: ADIPEX-P Take by mouth.   polyethylene glycol powder 17 GM/SCOOP powder Commonly known as: GLYCOLAX/MIRALAX MIX 1 CAPFUL OF POWDER (17 GM)  IN 8 OZ OF JUICE OR WATER AND DRINK BY MOUTH DAILY IN THE EVENING   potassium chloride 10 MEQ tablet Commonly known as: KLOR-CON Take 10 mEq by mouth 2 (two) times daily.   ProAir HFA 108 (90 Base) MCG/ACT inhaler Generic drug: albuterol Inhale 2 puffs into the lungs every 4 (four) hours as needed for wheezing or shortness of breath.   promethazine 25 MG tablet Commonly known as: PHENERGAN Take 1 tablet (25 mg total) by mouth 4 (four) times daily as needed.   sucralfate 1 g tablet Commonly known as: Carafate Take 1 tablet (1 g total) by mouth 4 (four) times daily -  with meals and at bedtime.   topiramate 200 MG tablet Commonly known as: TOPAMAX Take 200 mg by mouth at bedtime.   Tyler Aas FlexTouch 100 UNIT/ML FlexTouch Pen Generic drug: insulin degludec INJECT 60 UNITS INTO THE SKIN AT BEDTIME   Vitamin D3 125 MCG (5000 UT) Caps TAKE ONE CAPSULE BY MOUTH DAILY   zolpidem 10 MG tablet Commonly known as: AMBIEN Take 10 mg by mouth at bedtime.       Allergies:  Allergies  Allergen Reactions  . Amphetamine-Dextroamphetamine Swelling  . Nitrofuran Derivatives Itching and Swelling  . Amphetamine-Dextroamphet Er Swelling  . Pregabalin Swelling  . Topiramate Other (See Comments)    Tongue tingle  . Verelan [Verapamil] Rash    Family History: Family History  Problem Relation Age of Onset  . Heart attack Mother   . Colon cancer Mother   . Heart attack Father   . Stroke Sister   . Hemophilia Child   . Cancer Other   . Coronary artery disease Other   . Cancer Brother   . Cancer Maternal Aunt   . Cancer Maternal Uncle   . Cancer Paternal Aunt   . Cancer Paternal Uncle   . Cancer Brother     Social History:  reports that she has never smoked. She has never used smokeless tobacco. She reports that she does not drink alcohol and does not use drugs.  ROS: All other review of systems were reviewed and are negative except what is noted above in  HPI  Physical  Exam: BP 111/64   Pulse 81   Temp 98.9 F (37.2 C)   Ht 5' 4"  (1.626 m)   Wt 220 lb (99.8 kg)   BMI 37.76 kg/m   Constitutional:  Alert and oriented, No acute distress. HEENT: [place] AT, moist mucus membranes.  Trachea midline, no masses. Cardiovascular: No clubbing, cyanosis, or edema. Respiratory: Normal respiratory effort, no increased work of breathing. GI: Abdomen is soft, nontender, nondistended, no abdominal masses GU: No CVA tenderness.  Lymph: No cervical or inguinal lymphadenopathy. Skin: No rashes, bruises or suspicious lesions. Neurologic: Grossly intact, no focal deficits, moving all 4 extremities. Psychiatric: Normal mood and affect.  Laboratory Data: Lab Results  Component Value Date   WBC 7.8 12/07/2017   HGB 12.6 12/07/2017   HCT 39.6 12/07/2017   MCV 83.4 12/07/2017   PLT 369 12/07/2017    Lab Results  Component Value Date   CREATININE 0.66 10/27/2019    No results found for: PSA  No results found for: TESTOSTERONE  Lab Results  Component Value Date   HGBA1C 9.7 (H) 10/27/2019    Urinalysis    Component Value Date/Time   COLORURINE YELLOW 12/07/2017 1413   APPEARANCEUR Cloudy (A) 10/10/2019 1125   LABSPEC 1.015 12/07/2017 1413   PHURINE 7.0 12/07/2017 1413   GLUCOSEU Negative 10/10/2019 1125   HGBUR NEGATIVE 12/07/2017 1413   BILIRUBINUR Negative 10/10/2019 1125   KETONESUR NEGATIVE 12/07/2017 1413   PROTEINUR Trace (A) 10/10/2019 1125   PROTEINUR NEGATIVE 12/07/2017 1413   UROBILINOGEN 0.2 12/02/2012 1520   NITRITE Negative 10/10/2019 1125   NITRITE NEGATIVE 12/07/2017 1413   LEUKOCYTESUR 3+ (A) 10/10/2019 1125    Lab Results  Component Value Date   LABMICR See below: 10/10/2019   WBCUA >30 (A) 10/10/2019   LABEPIT 0-10 10/10/2019   BACTERIA Moderate (A) 10/10/2019    Pertinent Imaging:  No results found for this or any previous visit.  No results found for this or any previous visit.  No results found for this or any  previous visit.  No results found for this or any previous visit.  No results found for this or any previous visit.  No results found for this or any previous visit.  No results found for this or any previous visit.  Results for orders placed during the hospital encounter of 10/10/19  CT RENAL STONE STUDY  Narrative CLINICAL DATA:  Follow-up renal calculi.  EXAM: CT ABDOMEN AND PELVIS WITHOUT CONTRAST  TECHNIQUE: Multidetector CT imaging of the abdomen and pelvis was performed following the standard protocol without IV contrast.  COMPARISON:  CT scan 08/22/2019  FINDINGS: Lower chest: Streaky scarring changes and atelectasis. No worrisome pulmonary lesions or acute pulmonary findings. No pleural effusion. The heart is normal in size. No pericardial effusion. Coronary artery and aortic calcifications are noted.  Hepatobiliary: No hepatic lesions or intrahepatic biliary dilatation. The gallbladder is surgically absent. No common bile duct dilatation.  Pancreas: No mass, inflammation or ductal dilatation.  Spleen: Normal size. No focal lesions. Evidence of prior splenic infarct. Stable dense calcification in the splenic hilum.  Adrenals/Urinary Tract: Stable 2 cm left adrenal gland nodule measuring 13.5 Hounsfield units and most consistent with a benign adenoma. The right adrenal gland is normal.  The 3.5 mm calculus noted in the right renal pelvis on the prior study is now and a mid to lower pole calyx anteriorly. Another small lower pole calculus is noted. No obstructing ureteral calculi. No left-sided renal  or ureteral calculi. No worrisome renal lesions are seen without contrast. The bladder is unremarkable. It is largely decompressed.  Stomach/Bowel: The stomach, duodenum, small bowel and colon are grossly normal without oral contrast. No acute inflammatory process, mass lesions or obstructive findings. The terminal ileum is normal. The appendix is  normal.  Vascular/Lymphatic: Stable age advanced atherosclerotic calcifications involving the aorta and iliac arteries. No aneurysm. No mesenteric or retroperitoneal mass or adenopathy.  Reproductive: The uterus is surgically absent. Both ovaries are still present and appear normal.  Other: No pelvic mass or adenopathy. No free pelvic fluid collections. No inguinal mass or adenopathy. No abdominal wall hernia or subcutaneous lesions.  Musculoskeletal: No significant bony findings. Moderate lumbar facet disease noted.  IMPRESSION: 1. 3.5 mm right renal calculus is now in a mid to lower pole calyx anteriorly. Another small lower pole calculus is noted. No obstructing ureteral calculi or bladder calculi. 2. No worrisome renal or bladder lesions without contrast. 3. Stable 2 cm left adrenal gland nodule, most consistent with a benign adenoma. 4. Remote splenic infarct. 5. Status post cholecystectomy. No biliary dilatation. 6. Stable age advanced atherosclerotic calcifications involving the aorta and iliac arteries.  Aortic Atherosclerosis (ICD10-I70.0).   Electronically Signed By: Marijo Sanes M.D. On: 10/10/2019 11:52   Assessment & Plan:    1. Nephrolithiasis -patient scheduled for right ureteroscopic stone extraction -rx for phenergan  - Urinalysis, Routine w reflex microscopic   No follow-ups on file.  Nicolette Bang, MD  [facility] [place]

## 2019-12-20 ENCOUNTER — Encounter: Payer: Medicare Other | Attending: Registered Nurse | Admitting: Registered Nurse

## 2019-12-20 ENCOUNTER — Encounter: Payer: Self-pay | Admitting: Registered Nurse

## 2019-12-20 ENCOUNTER — Other Ambulatory Visit: Payer: Self-pay

## 2019-12-20 VITALS — BP 120/76 | HR 72 | Temp 98.3°F | Ht 64.0 in | Wt 227.8 lb

## 2019-12-20 DIAGNOSIS — M5412 Radiculopathy, cervical region: Secondary | ICD-10-CM | POA: Insufficient documentation

## 2019-12-20 DIAGNOSIS — M546 Pain in thoracic spine: Secondary | ICD-10-CM | POA: Diagnosis present

## 2019-12-20 DIAGNOSIS — M542 Cervicalgia: Secondary | ICD-10-CM | POA: Diagnosis present

## 2019-12-20 DIAGNOSIS — M7061 Trochanteric bursitis, right hip: Secondary | ICD-10-CM

## 2019-12-20 DIAGNOSIS — M48062 Spinal stenosis, lumbar region with neurogenic claudication: Secondary | ICD-10-CM | POA: Diagnosis present

## 2019-12-20 DIAGNOSIS — M1711 Unilateral primary osteoarthritis, right knee: Secondary | ICD-10-CM | POA: Insufficient documentation

## 2019-12-20 DIAGNOSIS — M5416 Radiculopathy, lumbar region: Secondary | ICD-10-CM | POA: Insufficient documentation

## 2019-12-20 DIAGNOSIS — M7062 Trochanteric bursitis, left hip: Secondary | ICD-10-CM | POA: Insufficient documentation

## 2019-12-20 DIAGNOSIS — M1712 Unilateral primary osteoarthritis, left knee: Secondary | ICD-10-CM

## 2019-12-20 DIAGNOSIS — G894 Chronic pain syndrome: Secondary | ICD-10-CM | POA: Diagnosis present

## 2019-12-20 DIAGNOSIS — Z79891 Long term (current) use of opiate analgesic: Secondary | ICD-10-CM | POA: Diagnosis present

## 2019-12-20 DIAGNOSIS — Z5181 Encounter for therapeutic drug level monitoring: Secondary | ICD-10-CM | POA: Insufficient documentation

## 2019-12-20 DIAGNOSIS — I2510 Atherosclerotic heart disease of native coronary artery without angina pectoris: Secondary | ICD-10-CM

## 2019-12-20 MED ORDER — MORPHINE SULFATE ER 30 MG PO TBCR
30.0000 mg | EXTENDED_RELEASE_TABLET | Freq: Two times a day (BID) | ORAL | 0 refills | Status: DC
Start: 2019-12-20 — End: 2020-01-24

## 2019-12-20 NOTE — Progress Notes (Signed)
Subjective:    Patient ID: [name], female    DOB: 1950-03-27, 69 y.o.   MRN: [id number]  HPI: [name] is a 69 y.o. female who returns for follow up appointment for chronic pain and medication refill. She states her  pain is located in her neck radiating into her bilateral shoulders, mid- lower back pain radiating into her bilateral lower extremities , bilateral hip pain and bilateral knee pain. She rates her pain 5. Her  current exercise regime is walking, she was encouraged to increase her HEP as tolerated, she verbalizes understanding.  [name] Morphine equivalent is 75.00 MME.    Last Oral Swab was Performed on 07/21/2019, see note for details.    Pain Inventory Average Pain 5 Pain Right Now 5 My pain is sharp, burning, dull, stabbing, tingling and aching  In the last 24 hours, has pain interfered with the following? General activity 4 Relation with others 3 Enjoyment of life 5 What TIME of day is your pain at its worst? morning  and night Sleep (in general) Poor  Pain is worse with: walking, bending, inactivity and standing Pain improves with: rest, pacing activities and medication Relief from Meds: 5  Family History  Problem Relation Age of Onset  . Heart attack Mother   . Colon cancer Mother   . Heart attack Father   . Stroke Sister   . Hemophilia Child   . Cancer Other   . Coronary artery disease Other   . Cancer Brother   . Cancer Maternal Aunt   . Cancer Maternal Uncle   . Cancer Paternal Aunt   . Cancer Paternal Uncle   . Cancer Brother    Social History   Socioeconomic History  . Marital status: Divorced    Spouse name: Not on file  . Number of children: 4  . Years of education: Not on file  . Highest education level: Not on file  Occupational History  . Occupation: Disabled  Tobacco Use  . Smoking status: Never Smoker  . Smokeless tobacco: Never Used  Vaping Use  . Vaping Use: Never used  Substance and Sexual Activity  .  Alcohol use: No    Alcohol/week: 0.0 standard drinks  . Drug use: No  . Sexual activity: Not on file  Other Topics Concern  . Not on file  Social History Narrative   Patient does not get regular exercise   Denies caffeine use    Social Determinants of Health   Financial Resource Strain:   . Difficulty of Paying Living Expenses: Not on file  Food Insecurity:   . Worried About Charity fundraiser in the Last Year: Not on file  . Ran Out of Food in the Last Year: Not on file  Transportation Needs:   . Lack of Transportation (Medical): Not on file  . Lack of Transportation (Non-Medical): Not on file  Physical Activity:   . Days of Exercise per Week: Not on file  . Minutes of Exercise per Session: Not on file  Stress:   . Feeling of Stress : Not on file  Social Connections:   . Frequency of Communication with Friends and Family: Not on file  . Frequency of Social Gatherings with Friends and Family: Not on file  . Attends Religious Services: Not on file  . Active Member of Clubs or Organizations: Not on file  . Attends Archivist Meetings: Not on file  . Marital Status: Not on file  Past Surgical History:  Procedure Laterality Date  . ABDOMINAL HYSTERECTOMY    . Arm Surgery Bilateral    due to fall-pt broke both forearms  . BIOPSY N/A 07/01/2013   Procedure: BIOPSY;  Surgeon: Rogene Houston, MD;  Location: [facility];  Service: Endoscopy;  Laterality: N/A;  . CHOLECYSTECTOMY    . COLONOSCOPY  08/06/2011   Procedure: COLONOSCOPY;  Surgeon: Rogene Houston, MD;  [facility];  Service: Endoscopy;  Laterality: N/A;  215  . COLONOSCOPY WITH PROPOFOL N/A 12/31/2012   Procedure: COLONOSCOPY WITH PROPOFOL;  Surgeon: Rogene Houston, MD;  Location: [facility];  Service: Endoscopy;  Laterality: N/A;  in cecum at 0814, total withdrawal time 62mn  . COLONOSCOPY WITH PROPOFOL N/A 02/05/2018   Procedure: COLONOSCOPY WITH PROPOFOL;  Surgeon: RRogene Houston MD;  [facility];  Service: Endoscopy;  Laterality: N/A;  1:25  . CORONARY ANGIOPLASTY WITH STENT PLACEMENT    . ESOPHAGEAL DILATION N/A 06/07/2015   Procedure: ESOPHAGEAL DILATION;  Surgeon: NRogene Houston MD;  [facility];  Service: Endoscopy;  Laterality: N/A;  . ESOPHAGOGASTRODUODENOSCOPY N/A 06/07/2015   Procedure: ESOPHAGOGASTRODUODENOSCOPY (EGD);  Surgeon: NRogene Houston MD;  [facility];  Service: Endoscopy;  Laterality: N/A;  2:45 - moved to 1:00 - Ann to notify pt  . ESOPHAGOGASTRODUODENOSCOPY (EGD) WITH PROPOFOL N/A 12/31/2012   Procedure: ESOPHAGOGASTRODUODENOSCOPY (EGD) WITH PROPOFOL;  Surgeon: NRogene Houston MD;  [facility]: [facility];  Service: Endoscopy;  Laterality: N/A;  GE junction 37,  . ESOPHAGOGASTRODUODENOSCOPY (EGD) WITH PROPOFOL N/A 07/01/2013   Procedure: ESOPHAGOGASTRODUODENOSCOPY (EGD) WITH PROPOFOL;  Surgeon: NRogene Houston MD;  [facility];  Service: Endoscopy;  Laterality: N/A;  950  . MALONEY DILATION N/A 12/31/2012   Procedure: MALONEY DILATION;  Surgeon: NRogene Houston MD;  [facility]: [facility];  Service: Endoscopy;  Laterality: N/A;  used a # 54,#56  . MALONEY DILATION N/A 07/01/2013   Procedure: MVenia MinksDILATION;  Surgeon: NRogene Houston MD;  [facility];  Service: Endoscopy;  Laterality: N/A;  54/58; no heme present  . POLYPECTOMY  02/05/2018   Procedure: POLYPECTOMY;  Surgeon: RRogene Houston MD;  [facility];  Service: Endoscopy;;  distal rectum (HS)   Past Surgical History:  Procedure Laterality Date  . ABDOMINAL HYSTERECTOMY    . Arm Surgery Bilateral    due to fall-pt broke both forearms  . BIOPSY N/A 07/01/2013   Procedure: BIOPSY;  Surgeon: NRogene Houston MD;  [facility];  Service: Endoscopy;  Laterality: N/A;  . CHOLECYSTECTOMY    . COLONOSCOPY  08/06/2011   Procedure: COLONOSCOPY;  Surgeon: NRogene Houston MD;  [facility];  Service: Endoscopy;  Laterality: N/A;  215  . COLONOSCOPY  WITH PROPOFOL N/A 12/31/2012   Procedure: COLONOSCOPY WITH PROPOFOL;  Surgeon: NRogene Houston MD;  [facility];  Service: Endoscopy;  Laterality: N/A;  in cecum at 0814, total withdrawal time 149m  . COLONOSCOPY WITH PROPOFOL N/A 02/05/2018   Procedure: COLONOSCOPY WITH PROPOFOL;  Surgeon: ReRogene HoustonMD;  [facility];  Service: Endoscopy;  Laterality: N/A;  1:25  . CORONARY ANGIOPLASTY WITH STENT PLACEMENT    . ESOPHAGEAL DILATION N/A 06/07/2015   Procedure: ESOPHAGEAL DILATION;  Surgeon: NaRogene HoustonMD;  [facility];  Service: Endoscopy;  Laterality: N/A;  . ESOPHAGOGASTRODUODENOSCOPY N/A 06/07/2015   Procedure: ESOPHAGOGASTRODUODENOSCOPY (EGD);  Surgeon: NaRogene Houston  MD;  [facility];  Service: Endoscopy;  Laterality: N/A;  2:45 - moved to 1:00 - Ann to notify pt  . ESOPHAGOGASTRODUODENOSCOPY (EGD) WITH PROPOFOL N/A 12/31/2012   Procedure: ESOPHAGOGASTRODUODENOSCOPY (EGD) WITH PROPOFOL;  Surgeon: Rogene Houston, MD;  [facility];  Service: Endoscopy;  Laterality: N/A;  GE junction 37,  . ESOPHAGOGASTRODUODENOSCOPY (EGD) WITH PROPOFOL N/A 07/01/2013   Procedure: ESOPHAGOGASTRODUODENOSCOPY (EGD) WITH PROPOFOL;  Surgeon: Rogene Houston, MD;  [facility];  Service: Endoscopy;  Laterality: N/A;  950  . MALONEY DILATION N/A 12/31/2012   Procedure: MALONEY DILATION;  Surgeon: Rogene Houston, MD;  [facility];  Service: Endoscopy;  Laterality: N/A;  used a # 54,#56  . MALONEY DILATION N/A 07/01/2013   Procedure: Venia Minks DILATION;  Surgeon: Rogene Houston, MD;  [facility];  Service: Endoscopy;  Laterality: N/A;  54/58; no heme present  . POLYPECTOMY  02/05/2018   Procedure: POLYPECTOMY;  Surgeon: Rogene Houston, MD;  [facility];  Service: Endoscopy;;  distal rectum (HS)   Past Medical History:  Diagnosis Date  . Anxiety and depression   . Arthritis   . CAD (coronary artery disease)    Stent placement  circumflex coronary 2007, catheterization 2008 patent stents. Normal LV function  . Chest pain   . CHF (congestive heart failure) ([place])   . COPD (chronic obstructive pulmonary disease) ([facility])    no home O2  . Depression   . Diabetes mellitus    Insulin dependent  . Diabetic polyneuropathy ([facility])     severe on multiple medications  . Dyslipidemia   . GERD (gastroesophageal reflux disease)   . Headache(784.0)   . Heart attack ([place])   . Heartburn   . 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 High cholesterol   . Hx of blood clots   . Hypertension   . MI (myocardial infarction) ([place])    2007  . Obstructive sleep apnea    CPAP, setting ?2  . Palpitations   . Sleep apnea   . Tachycardia    BP 120/76   Pulse 72   Temp 98.3 F (36.8 C)   Ht 5' 4"  (1.626 m)   Wt 227 lb 12.8 oz (103.3 kg)   SpO2 95%   BMI 39.10 kg/m   Opioid Risk Score:   Fall Risk Score:  `1  Depression screen PHQ 2/9  Depression screen [facility] 2/9 11/18/2019 10/26/2019 09/26/2019 03/24/2019 07/16/2018 06/12/2017 04/14/2017  Decreased Interest 1 0 0 1 1 3 3   Down, Depressed, Hopeless 1 0 0 1 1 3 3   PHQ - 2 Score 2 0 0 2 2 6 6   Altered sleeping - - - - - 3 -  Tired, decreased energy - - - - - 3 -  Change in appetite - - - - - 0 -  Feeling bad or failure about yourself  - - - - - 3 -  Trouble concentrating - - - - - 3 -  Moving slowly or fidgety/restless - - - - - 1 -  Suicidal thoughts - - - - - 0 -  PHQ-9 Score - - - - - 19 -  Difficult doing work/chores - - - - - Very difficult -  Some recent data might be hidden   Review of Systems  Musculoskeletal: Positive for back pain, gait problem and joint swelling.  Neurological: Positive for weakness.  All other systems reviewed and are negative.  Objective:   Physical Exam Vitals and nursing note reviewed.  Constitutional:      Appearance: Normal appearance.  Neck:     Comments: Cervical Paraspinal Tenderness: C-5-C-6 Cardiovascular:     Rate and Rhythm: Normal rate and  regular rhythm.     Pulses: Normal pulses.     Heart sounds: Normal heart sounds.  Pulmonary:     Effort: Pulmonary effort is normal.     Breath sounds: Normal breath sounds.  Musculoskeletal:     Cervical back: Normal range of motion and neck supple.     Right lower leg: Edema present.     Left lower leg: Edema present.     Comments: Normal Muscle Bulk and Muscle Testing Reveals:  Upper Extremities: Full ROM and Muscle Strength 5/5 Bilateral AC Joint Tenderness Thoracic and Lumbar Hypersensitivity Bilateral Greater Trochanter Tenderness:   Lower Extremities: Full ROM and Muscle Strength 5/5 Arises from Table Slowly Antalgic Gait   Skin:    General: Skin is warm and dry.  Neurological:     Mental Status: She is alert and oriented to person, place, and time.  Psychiatric:        Mood and Affect: Mood normal.        Behavior: Behavior normal.           Assessment & Plan:  1.Lumbar pain lumbar spondylosis: Encouraged to continue toincrease Activity as tolerated.`0/07/2019. Continue current medication regimen. Continue: MSIR 15 mg one tablet daily as needed for breakthrough pain and Refilled: MS Contin 30 MG one tablet every12hours #60. Boyfrien Joe is dispensing [name] Medication she reports. We will continue the opioid monitoring program, this consists of regular clinic visits, examinations, urine drug screen, pill counts as well as use of [place] Controlled Substance Reporting system. A 12 month History has been reviewed on the [place] on 12/20/2019.  2. Lumbar Radiculitis: Continuecurrent medication regimen withGabapentin.12/20/2019 3. Bilateral Knee Pain/ Degenerative:Continue Voltaren Gel. Continue to Monitor.12/20/2019 4. Severe diabetic poly neuropathy: Continuecurrent medication regimen withGabapentin.12/20/2019 5. Insomnia: ContinueArmodafinil Psychiatry  Following.12/20/2019 6.Anxiety/Depression:Continue Current Medication regimen as prescribed by Psychiatry.12/20/2019 7. De Quervains Tenosynovitis: No Complaints Today.12/20/2019 8.BilateralGreater Trochanteric Bursitis:Continue with Ice and Heat Therapy. Continue to Monitor.12/20/2019. 9. Cervicalgia/ Cervical Radiculitis:Continue Gabapentin.Continue with HEP as Tolerated and continue to monitor.12/20/2019 10. Chronic Bilateral Thoracic Back Pain: Continue HEP as Tolerated. Continue current medication regimen. Continue to monitor.12/20/2019.  20  minutes of face to face patient care time was spent during this visit. All questions were encouraged and answered.  F/U in 1 month

## 2019-12-20 NOTE — Patient Instructions (Signed)
[name]  12/20/2019     @PREFPERIOPPHARMACY @   Your procedure is scheduled on  12/22/2019.  Report to [facility] at  1300 (1:00)  P.M.  Call this number if you have problems the morning of surgery:  [phone]   Remember:  Do not eat or drink after midnight.                       Take these medicines the morning of surgery with A SIP OF WATER  Wellbutrin, cardiazem, gabapentin, ativan(if needed), MS Contin or Morphine(if needed), zofran(if needed), protonix, flomax. Use your inhaler before you come and bring your rescue inhaler with you. Take 1/2 of your usual night time insulin (35 units), the night before your procedure. DO NOT take any medications for diabetes the morning of your procedure.    Do not wear jewelry, make-up or nail polish.  Do not wear lotions, powders, or perfumes. Please wear deodorant and brush your teeth.  Do not shave 48 hours prior to surgery.  Men may shave face and neck.  Do not bring valuables to the hospital.  [facility] is not responsible for any belongings or valuables.  Contacts, dentures or bridgework may not be worn into surgery.  Leave your suitcase in the car.  After surgery it may be brought to your room.  For patients admitted to the hospital, discharge time will be determined by your treatment team.  Patients discharged the day of surgery will not be allowed to drive home.   Name and phone number of your driver:   family Special instructions:  DO NOT smoke the morning of your procedure.  Please read over the following fact sheets that you were given. Anesthesia Post-op Instructions and Care and Recovery After Surgery       Ureteral Stent Implantation, Care After This sheet gives you information about how to care for yourself after your procedure. Your health care provider may also give you more specific instructions. If you have problems or questions, contact your health care provider. What can I expect after the  procedure? After the procedure, it is common to have:  Nausea.  Mild pain when you urinate. You may feel this pain in your lower back or lower abdomen. The pain should stop within a few minutes after you urinate. This may last for up to 1 week.  A small amount of blood in your urine for several days. Follow these instructions at home: Medicines  Take over-the-counter and prescription medicines only as told by your health care provider.  If you were prescribed an antibiotic medicine, take it as told by your health care provider. Do not stop taking the antibiotic even if you start to feel better.  Do not drive for 24 hours if you were given a sedative during your procedure.  Ask your health care provider if the medicine prescribed to you requires you to avoid driving or using heavy machinery. Activity  Rest as told by your health care provider.  Avoid sitting for a long time without moving. Get up to take short walks every 1-2 hours. This is important to improve blood flow and breathing. Ask for help if you feel weak or unsteady.  Return to your normal activities as told by your health care provider. Ask your health care provider what activities are safe for you. General instructions   Watch for any blood in your urine. Call your health  care provider if the amount of blood in your urine increases.  If you have a catheter: ? Follow instructions from your health care provider about taking care of your catheter and collection bag. ? Do not take baths, swim, or use a hot tub until your health care provider approves. Ask your health care provider if you may take showers. You may only be allowed to take sponge baths.  Drink enough fluid to keep your urine pale yellow.  Do not use any products that contain nicotine or tobacco, such as cigarettes, e-cigarettes, and chewing tobacco. These can delay healing after surgery. If you need help quitting, ask your health care provider.  Keep all  follow-up visits as told by your health care provider. This is important. Contact a health care provider if:  You have pain that gets worse or does not get better with medicine, especially pain when you urinate.  You have difficulty urinating.  You feel nauseous or you vomit repeatedly during a period of more than 2 days after the procedure. Get help right away if:  Your urine is dark red or has blood clots in it.  You are leaking urine (have incontinence).  The end of the stent comes out of your urethra.  You cannot urinate.  You have sudden, sharp, or severe pain in your abdomen or lower back.  You have a fever.  You have swelling or pain in your legs.  You have difficulty breathing. Summary  After the procedure, it is common to have mild pain when you urinate that goes away within a few minutes after you urinate. This may last for up to 1 week.  Watch for any blood in your urine. Call your health care provider if the amount of blood in your urine increases.  Take over-the-counter and prescription medicines only as told by your health care provider.  Drink enough fluid to keep your urine pale yellow. This information is not intended to replace advice given to you by your health care provider. Make sure you discuss any questions you have with your health care provider. Document Revised: 12/08/2017 Document Reviewed: 12/09/2017 Elsevier Patient Education  2020 [employer].  Cystoscopy Cystoscopy is a procedure that is used to help diagnose and sometimes treat conditions that affect the lower urinary tract. The lower urinary tract includes the bladder and the urethra. The urethra is the tube that drains urine from the bladder. Cystoscopy is done using a thin, tube-shaped instrument with a light and camera at the end (cystoscope). The cystoscope may be hard or flexible, depending on the goal of the procedure. The cystoscope is inserted through the urethra, into the  bladder. Cystoscopy may be recommended if you have:  Urinary tract infections that keep coming back.  Blood in the urine (hematuria).  An inability to control when you urinate (urinary incontinence) or an overactive bladder.  Unusual cells found in a urine sample.  A blockage in the urethra, such as a urinary stone.  Painful urination.  An abnormality in the bladder found during an intravenous pyelogram (IVP) or CT scan. Cystoscopy may also be done to remove a sample of tissue to be examined under a microscope (biopsy). Tell a health care provider about:  Any allergies you have.  All medicines you are taking, including vitamins, herbs, eye drops, creams, and over-the-counter medicines.  Any problems you or family members have had with anesthetic medicines.  Any blood disorders you have.  Any surgeries you have had.  Any medical  conditions you have.  Whether you are pregnant or may be pregnant. What are the risks? Generally, this is a safe procedure. However, problems may occur, including:  Infection.  Bleeding.  Allergic reactions to medicines.  Damage to other structures or organs. What happens before the procedure?  Ask your health care provider about: ? Changing or stopping your regular medicines. This is especially important if you are taking diabetes medicines or blood thinners. ? Taking medicines such as aspirin and ibuprofen. These medicines can thin your blood. Do not take these medicines unless your health care provider tells you to take them. ? Taking over-the-counter medicines, vitamins, herbs, and supplements.  Follow instructions from your health care provider about eating or drinking restrictions.  Ask your health care provider what steps will be taken to help prevent infection. These may include: ? Washing skin with a germ-killing soap. ? Taking antibiotic medicine.  You may have an exam or testing, such as: ? X-rays of the bladder, urethra, or  kidneys. ? Urine tests to check for signs of infection.  Plan to have someone take you home from the hospital or clinic. What happens during the procedure?   You will be given one or more of the following: ? A medicine to help you relax (sedative). ? A medicine to numb the area (local anesthetic).  The area around the opening of your urethra will be cleaned.  The cystoscope will be passed through your urethra into your bladder.  Germ-free (sterile) fluid will flow through the cystoscope to fill your bladder. The fluid will stretch your bladder so that your health care provider can clearly examine your bladder walls.  Your doctor will look at the urethra and bladder. Your doctor may take a biopsy or remove stones.  The cystoscope will be removed, and your bladder will be emptied. The procedure may vary among health care providers and hospitals. What can I expect after the procedure? After the procedure, it is common to have:  Some soreness or pain in your abdomen and urethra.  Urinary symptoms. These include: ? Mild pain or burning when you urinate. Pain should stop within a few minutes after you urinate. This may last for up to 1 week. ? A small amount of blood in your urine for several days. ? Feeling like you need to urinate but producing only a small amount of urine. Follow these instructions at home: Medicines  Take over-the-counter and prescription medicines only as told by your health care provider.  If you were prescribed an antibiotic medicine, take it as told by your health care provider. Do not stop taking the antibiotic even if you start to feel better. General instructions  Return to your normal activities as told by your health care provider. Ask your health care provider what activities are safe for you.  Do not drive for 24 hours if you were given a sedative during your procedure.  Watch for any blood in your urine. If the amount of blood in your urine  increases, call your health care provider.  Follow instructions from your health care provider about eating or drinking restrictions.  If a tissue sample was removed for testing (biopsy) during your procedure, it is up to you to get your test results. Ask your health care provider, or the department that is doing the test, when your results will be ready.  Drink enough fluid to keep your urine pale yellow.  Keep all follow-up visits as told by your health care provider.  This is important. Contact a health care provider if you:  Have pain that gets worse or does not get better with medicine, especially pain when you urinate.  Have trouble urinating.  Have more blood in your urine. Get help right away if you:  Have blood clots in your urine.  Have abdominal pain.  Have a fever or chills.  Are unable to urinate. Summary  Cystoscopy is a procedure that is used to help diagnose and sometimes treat conditions that affect the lower urinary tract.  Cystoscopy is done using a thin, tube-shaped instrument with a light and camera at the end.  After the procedure, it is common to have some soreness or pain in your abdomen and urethra.  Watch for any blood in your urine. If the amount of blood in your urine increases, call your health care provider.  If you were prescribed an antibiotic medicine, take it as told by your health care provider. Do not stop taking the antibiotic even if you start to feel better. This information is not intended to replace advice given to you by your health care provider. Make sure you discuss any questions you have with your health care provider. Document Revised: 02/23/2018 Document Reviewed: 02/23/2018 Elsevier Patient Education  2020 [place] Anesthesia, Adult, Care After This sheet gives you information about how to care for yourself after your procedure. Your health care provider may also give you more specific instructions. If you have  problems or questions, contact your health care provider. What can I expect after the procedure? After the procedure, the following side effects are common:  Pain or discomfort at the IV site.  Nausea.  Vomiting.  Sore throat.  Trouble concentrating.  Feeling cold or chills.  Weak or tired.  Sleepiness and fatigue.  Soreness and body aches. These side effects can affect parts of the body that were not involved in surgery. Follow these instructions at home:  For at least 24 hours after the procedure:  Have a responsible adult stay with you. It is important to have someone help care for you until you are awake and alert.  Rest as needed.  Do not: ? Participate in activities in which you could fall or become injured. ? Drive. ? Use heavy machinery. ? Drink alcohol. ? Take sleeping pills or medicines that cause drowsiness. ? Make important decisions or sign legal documents. ? Take care of children on your own. Eating and drinking  Follow any instructions from your health care provider about eating or drinking restrictions.  When you feel hungry, start by eating small amounts of foods that are soft and easy to digest (bland), such as toast. Gradually return to your regular diet.  Drink enough fluid to keep your urine pale yellow.  If you vomit, rehydrate by drinking water, juice, or clear broth. General instructions  If you have sleep apnea, surgery and certain medicines can increase your risk for breathing problems. Follow instructions from your health care provider about wearing your sleep device: ? Anytime you are sleeping, including during daytime naps. ? While taking prescription pain medicines, sleeping medicines, or medicines that make you drowsy.  Return to your normal activities as told by your health care provider. Ask your health care provider what activities are safe for you.  Take over-the-counter and prescription medicines only as told by your health  care provider.  If you smoke, do not smoke without supervision.  Keep all follow-up visits as told by your health care provider.  This is important. Contact a health care provider if:  You have nausea or vomiting that does not get better with medicine.  You cannot eat or drink without vomiting.  You have pain that does not get better with medicine.  You are unable to pass urine.  You develop a skin rash.  You have a fever.  You have redness around your IV site that gets worse. Get help right away if:  You have difficulty breathing.  You have chest pain.  You have blood in your urine or stool, or you vomit blood. Summary  After the procedure, it is common to have a sore throat or nausea. It is also common to feel tired.  Have a responsible adult stay with you for the first 24 hours after general anesthesia. It is important to have someone help care for you until you are awake and alert.  When you feel hungry, start by eating small amounts of foods that are soft and easy to digest (bland), such as toast. Gradually return to your regular diet.  Drink enough fluid to keep your urine pale yellow.  Return to your normal activities as told by your health care provider. Ask your health care provider what activities are safe for you. This information is not intended to replace advice given to you by your health care provider. Make sure you discuss any questions you have with your health care provider. Document Revised: 03/06/2017 Document Reviewed: 10/17/2016 Elsevier Patient Education  [place].

## 2019-12-21 ENCOUNTER — Encounter (HOSPITAL_COMMUNITY)
Admission: RE | Admit: 2019-12-21 | Discharge: 2019-12-21 | Disposition: A | Discharge status: Home / Self Care | Payer: Medicare Other | Source: Ambulatory Visit | Attending: Urology | Admitting: Urology

## 2019-12-21 ENCOUNTER — Encounter (HOSPITAL_COMMUNITY): Payer: Self-pay

## 2019-12-21 ENCOUNTER — Other Ambulatory Visit (HOSPITAL_COMMUNITY)
Admission: RE | Admit: 2019-12-21 | Discharge: 2019-12-21 | Disposition: A | Discharge status: Home / Self Care | Payer: Medicare Other | Source: Ambulatory Visit | Attending: Urology | Admitting: Urology

## 2019-12-21 DIAGNOSIS — Z20822 Contact with and (suspected) exposure to covid-19: Secondary | ICD-10-CM | POA: Insufficient documentation

## 2019-12-21 DIAGNOSIS — Z01818 Encounter for other preprocedural examination: Secondary | ICD-10-CM | POA: Insufficient documentation

## 2019-12-21 LAB — SARS CORONAVIRUS 2 (TAT 6-24 HRS): SARS Coronavirus 2: NEGATIVE

## 2019-12-22 ENCOUNTER — Ambulatory Visit (HOSPITAL_COMMUNITY)
Admission: RE | Admit: 2019-12-22 | Discharge: 2019-12-22 | Disposition: A | Discharge status: Home / Self Care | Payer: Medicare Other | Attending: Urology | Admitting: Urology

## 2019-12-22 ENCOUNTER — Ambulatory Visit (HOSPITAL_COMMUNITY): Payer: Medicare Other

## 2019-12-22 ENCOUNTER — Encounter (HOSPITAL_COMMUNITY): Payer: Self-pay | Admitting: Urology

## 2019-12-22 ENCOUNTER — Ambulatory Visit (HOSPITAL_COMMUNITY): Payer: Medicare Other | Admitting: Anesthesiology

## 2019-12-22 ENCOUNTER — Other Ambulatory Visit: Payer: Self-pay

## 2019-12-22 ENCOUNTER — Encounter (HOSPITAL_COMMUNITY)
Admission: RE | Disposition: A | Discharge status: Home / Self Care | Payer: Self-pay | Source: Home / Self Care | Attending: Urology

## 2019-12-22 DIAGNOSIS — M1990 Unspecified osteoarthritis, unspecified site: Secondary | ICD-10-CM | POA: Insufficient documentation

## 2019-12-22 DIAGNOSIS — G709 Myoneural disorder, unspecified: Secondary | ICD-10-CM | POA: Diagnosis not present

## 2019-12-22 DIAGNOSIS — I2510 Atherosclerotic heart disease of native coronary artery without angina pectoris: Secondary | ICD-10-CM | POA: Insufficient documentation

## 2019-12-22 DIAGNOSIS — Z7984 Long term (current) use of oral hypoglycemic drugs: Secondary | ICD-10-CM | POA: Diagnosis not present

## 2019-12-22 DIAGNOSIS — Z7982 Long term (current) use of aspirin: Secondary | ICD-10-CM | POA: Diagnosis not present

## 2019-12-22 DIAGNOSIS — F419 Anxiety disorder, unspecified: Secondary | ICD-10-CM | POA: Insufficient documentation

## 2019-12-22 DIAGNOSIS — I252 Old myocardial infarction: Secondary | ICD-10-CM | POA: Insufficient documentation

## 2019-12-22 DIAGNOSIS — Z888 Allergy status to other drugs, medicaments and biological substances status: Secondary | ICD-10-CM | POA: Insufficient documentation

## 2019-12-22 DIAGNOSIS — J449 Chronic obstructive pulmonary disease, unspecified: Secondary | ICD-10-CM | POA: Insufficient documentation

## 2019-12-22 DIAGNOSIS — N201 Calculus of ureter: Secondary | ICD-10-CM | POA: Diagnosis present

## 2019-12-22 DIAGNOSIS — F32A Depression, unspecified: Secondary | ICD-10-CM | POA: Insufficient documentation

## 2019-12-22 DIAGNOSIS — Z955 Presence of coronary angioplasty implant and graft: Secondary | ICD-10-CM | POA: Diagnosis not present

## 2019-12-22 DIAGNOSIS — Z79891 Long term (current) use of opiate analgesic: Secondary | ICD-10-CM | POA: Insufficient documentation

## 2019-12-22 DIAGNOSIS — G4733 Obstructive sleep apnea (adult) (pediatric): Secondary | ICD-10-CM | POA: Insufficient documentation

## 2019-12-22 DIAGNOSIS — Z79899 Other long term (current) drug therapy: Secondary | ICD-10-CM | POA: Insufficient documentation

## 2019-12-22 DIAGNOSIS — E7800 Pure hypercholesterolemia, unspecified: Secondary | ICD-10-CM | POA: Diagnosis not present

## 2019-12-22 DIAGNOSIS — Z79890 Hormone replacement therapy (postmenopausal): Secondary | ICD-10-CM | POA: Insufficient documentation

## 2019-12-22 DIAGNOSIS — K219 Gastro-esophageal reflux disease without esophagitis: Secondary | ICD-10-CM | POA: Insufficient documentation

## 2019-12-22 DIAGNOSIS — I509 Heart failure, unspecified: Secondary | ICD-10-CM | POA: Diagnosis not present

## 2019-12-22 DIAGNOSIS — Z791 Long term (current) use of non-steroidal anti-inflammatories (NSAID): Secondary | ICD-10-CM | POA: Diagnosis not present

## 2019-12-22 DIAGNOSIS — N200 Calculus of kidney: Secondary | ICD-10-CM

## 2019-12-22 DIAGNOSIS — I110 Hypertensive heart disease with heart failure: Secondary | ICD-10-CM | POA: Diagnosis not present

## 2019-12-22 DIAGNOSIS — E1142 Type 2 diabetes mellitus with diabetic polyneuropathy: Secondary | ICD-10-CM | POA: Diagnosis not present

## 2019-12-22 HISTORY — PX: CYSTOSCOPY WITH RETROGRADE PYELOGRAM, URETEROSCOPY AND STENT PLACEMENT: SHX5789

## 2019-12-22 HISTORY — PX: STONE EXTRACTION WITH BASKET: SHX5318

## 2019-12-22 LAB — GLUCOSE, CAPILLARY: Glucose-Capillary: 118 mg/dL — ABNORMAL HIGH (ref 70–99)

## 2019-12-22 SURGERY — CYSTOURETEROSCOPY, WITH RETROGRADE PYELOGRAM AND STENT INSERTION
Anesthesia: General | Site: Ureter | Laterality: Right

## 2019-12-22 MED ORDER — ONDANSETRON HCL 4 MG/2ML IJ SOLN
INTRAMUSCULAR | Status: DC | PRN
  Administered 2019-12-22: 4 mg via INTRAVENOUS

## 2019-12-22 MED ORDER — LIDOCAINE HCL (CARDIAC) PF 50 MG/5ML IV SOSY
PREFILLED_SYRINGE | INTRAVENOUS | Status: DC | PRN
  Administered 2019-12-22: 100 mg via INTRAVENOUS

## 2019-12-22 MED ORDER — ONDANSETRON HCL 4 MG/2ML IJ SOLN
INTRAMUSCULAR | Status: AC
  Filled 2019-12-22: qty 2

## 2019-12-22 MED ORDER — DIATRIZOATE MEGLUMINE 30 % UR SOLN
URETHRAL | Status: AC
  Filled 2019-12-22: qty 100

## 2019-12-22 MED ORDER — PROPOFOL 10 MG/ML IV BOLUS
INTRAVENOUS | Status: DC | PRN
  Administered 2019-12-22: 50 mg via INTRAVENOUS
  Administered 2019-12-22: 150 mg via INTRAVENOUS

## 2019-12-22 MED ORDER — ONDANSETRON HCL 4 MG/2ML IJ SOLN: 4.0000 mg | Freq: Once | INTRAMUSCULAR | Status: DC | PRN

## 2019-12-22 MED ORDER — LACTATED RINGERS IV SOLN: INTRAVENOUS | Status: DC

## 2019-12-22 MED ORDER — FENTANYL CITRATE (PF) 100 MCG/2ML IJ SOLN
INTRAMUSCULAR | Status: AC
  Filled 2019-12-22: qty 2

## 2019-12-22 MED ORDER — DIATRIZOATE MEGLUMINE 30 % UR SOLN
URETHRAL | Status: DC | PRN
  Administered 2019-12-22: 5 mL

## 2019-12-22 MED ORDER — SODIUM CHLORIDE 0.9 % IV SOLN
INTRAVENOUS | Status: AC
  Filled 2019-12-22: qty 20

## 2019-12-22 MED ORDER — FLUCONAZOLE 150 MG PO TABS: 150.0000 mg | ORAL_TABLET | Freq: Every day | ORAL | 0 refills | Status: DC

## 2019-12-22 MED ORDER — CLOTRIMAZOLE-BETAMETHASONE 1-0.05 % EX CREA: TOPICAL_CREAM | CUTANEOUS | 1 refills | Status: AC

## 2019-12-22 MED ORDER — FENTANYL CITRATE (PF) 100 MCG/2ML IJ SOLN
INTRAMUSCULAR | Status: DC | PRN
Start: 2019-12-22 — End: 2019-12-22
  Administered 2019-12-22: 25 ug via INTRAVENOUS
  Administered 2019-12-22: 100 ug via INTRAVENOUS
  Administered 2019-12-22: 25 ug via INTRAVENOUS

## 2019-12-22 MED ORDER — LIDOCAINE 2% (20 MG/ML) 5 ML SYRINGE
INTRAMUSCULAR | Status: AC
  Filled 2019-12-22: qty 5

## 2019-12-22 MED ORDER — SODIUM CHLORIDE 0.9 % IV SOLN
2.0000 g | INTRAVENOUS | Status: AC
  Administered 2019-12-22: 2 g via INTRAVENOUS

## 2019-12-22 MED ORDER — HYDROCODONE-ACETAMINOPHEN 5-325 MG PO TABS
1.0000 | ORAL_TABLET | Freq: Four times a day (QID) | ORAL | 0 refills | Status: DC | PRN
Start: 2019-12-22 — End: 2020-01-24

## 2019-12-22 MED ORDER — SODIUM CHLORIDE 0.9 % IR SOLN
Status: DC | PRN
  Administered 2019-12-22: 3000 mL via INTRAVESICAL
  Administered 2019-12-22: 3000 mL

## 2019-12-22 MED ORDER — FENTANYL CITRATE (PF) 100 MCG/2ML IJ SOLN: 25.0000 ug | INTRAMUSCULAR | Status: DC | PRN

## 2019-12-22 MED ORDER — CHLORHEXIDINE GLUCONATE 0.12 % MT SOLN
15.0000 mL | Freq: Once | OROMUCOSAL | Status: AC
  Administered 2019-12-22: 15 mL via OROMUCOSAL

## 2019-12-22 MED ORDER — WATER FOR IRRIGATION, STERILE IR SOLN
Status: DC | PRN
  Administered 2019-12-22: 500 mL

## 2019-12-22 MED ORDER — ORAL CARE MOUTH RINSE: 15.0000 mL | Freq: Once | OROMUCOSAL | Status: AC

## 2019-12-22 SURGICAL SUPPLY — 23 items
BAG DRAIN URO TABLE W/ADPT NS (BAG) ×3 IMPLANT
BAG DRN 8 ADPR NS SKTRN CSTL (BAG) ×2
BAG HAMPER (MISCELLANEOUS) ×3 IMPLANT
CATH INTERMIT  6FR 70CM (CATHETERS) ×3 IMPLANT
CLOTH BEACON ORANGE TIMEOUT ST (SAFETY) ×3 IMPLANT
DECANTER SPIKE VIAL GLASS SM (MISCELLANEOUS) ×3 IMPLANT
EXTRACTOR STONE NITINOL NGAGE (UROLOGICAL SUPPLIES) ×1 IMPLANT
GLOVE BIO SURGEON STRL SZ8 (GLOVE) ×3 IMPLANT
GLOVE BIOGEL PI IND STRL 7.0 (GLOVE) ×6 IMPLANT
GLOVE BIOGEL PI INDICATOR 7.0 (GLOVE) ×3
GLOVE ECLIPSE 6.5 STRL STRAW (GLOVE) ×2 IMPLANT
GOWN STRL REUS W/TWL LRG LVL3 (GOWN DISPOSABLE) ×4 IMPLANT
GOWN STRL REUS W/TWL XL LVL3 (GOWN DISPOSABLE) ×3 IMPLANT
GUIDEWIRE STR DUAL SENSOR (WIRE) ×3 IMPLANT
GUIDEWIRE STR ZIPWIRE 035X150 (MISCELLANEOUS) ×3 IMPLANT
IV NS IRRIG 3000ML ARTHROMATIC (IV SOLUTION) ×6 IMPLANT
KIT TURNOVER CYSTO (KITS) ×3 IMPLANT
MANIFOLD NEPTUNE II (INSTRUMENTS) ×3 IMPLANT
PACK CYSTO (CUSTOM PROCEDURE TRAY) ×3 IMPLANT
PAD ARMBOARD 7.5X6 YLW CONV (MISCELLANEOUS) ×3 IMPLANT
SYR 10ML LL (SYRINGE) ×3 IMPLANT
TOWEL OR 17X26 4PK STRL BLUE (TOWEL DISPOSABLE) ×3 IMPLANT
WATER STERILE IRR 500ML POUR (IV SOLUTION) ×3 IMPLANT

## 2019-12-22 NOTE — Op Note (Signed)
Preoperative diagnosis: Right ureteral stone  Postoperative diagnosis: Same  Procedure: 1 cystoscopy 2   right retrograde pyelography 3.  Intraoperative fluoroscopy, under one hour, with interpretation 4.  Right ureteroscopic stone manipulation with basket extraction 5.  Right 6 x 26 JJ stent removal  Attending: Rosie Fate  Anesthesia: General  Estimated blood loss: None  Drains: none  Specimens: ureteral calculus  Antibiotics: Rocephin  Findings: No masses/lesions in the bladder. No hydronephrosis. 35m right proximal ureteral calculus.  Indications: Patient is a 69year old female with a history of ureteral stone and who has failed medical expulsive therapy.  She underwent ureteral stent placement 1 month ago. After discussing treatment options, she decided proceed with right ureteroscopic stone manipulation.  Procedure her in detail: The patient was brought to the operating room and a brief timeout was done to ensure correct patient, correct procedure, correct site.  General anesthesia was administered patient was placed in dorsal lithotomy position.  Her genitalia was then prepped and draped in usual sterile fashion.  A rigid 261French cystoscope was passed in the urethra and the bladder.  Bladder was inspected free masses or lesions.  the right ureteral orifices were in the normal orthotopic locations.  Using a grasper the rigth ureteral stent was brought to the urethral meatus. A zipwire was advanced through the stent and up to the renal pelvis. a 6 french ureteral catheter was then instilled into the right ureter orifice.  a gentle retrograde was obtained and findings noted above.   we then removed the cystoscope and cannulated the right ureteral orifice with a semirigid ureteroscope.  we then encountered the stone in the proximal ureter which was removed with an NGage basket. We then perform ureteroscopy to the renal pelvis and noted no residual calculi.  We elected to not  leave a stent since this was an uncomplicated ureteroscopy.  the bladder was then drained and this concluded the procedure which was well tolerated by patient.  Complications: None  Condition: Stable, extubated, transferred to PACU  Plan: Patient is to be discharged home as to follow-up in one week

## 2019-12-22 NOTE — Anesthesia Preprocedure Evaluation (Signed)
Anesthesia Evaluation  Patient identified by MRN, date of birth, ID band Patient awake    Reviewed: Allergy & Precautions, H&P , NPO status , Patient's Chart, lab work & pertinent test results, reviewed documented beta blocker date and time   Airway Mallampati: II  TM Distance: >3 FB Neck ROM: full    Dental no notable dental hx. (+) Teeth Intact   Pulmonary sleep apnea and Continuous Positive Airway Pressure Ventilation , COPD,    Pulmonary exam normal breath sounds clear to auscultation       Cardiovascular Exercise Tolerance: Good hypertension, + CAD, + Past MI and +CHF   Rhythm:regular Rate:Normal     Neuro/Psych  Headaches, PSYCHIATRIC DISORDERS Anxiety Depression  Neuromuscular disease    GI/Hepatic Neg liver ROS, GERD  Medicated,  Endo/Other  negative endocrine ROSdiabetes  Renal/GU Renal disease  negative genitourinary   Musculoskeletal   Abdominal   Peds  Hematology negative hematology ROS (+)   Anesthesia Other Findings   Reproductive/Obstetrics negative OB ROS                             Anesthesia Physical Anesthesia Plan  ASA: III  Anesthesia Plan: General   Post-op Pain Management:    Induction:   PONV Risk Score and Plan: Ondansetron  Airway Management Planned:   Additional Equipment:   Intra-op Plan:   Post-operative Plan:   Informed Consent: I have reviewed the patients History and Physical, chart, labs and discussed the procedure including the risks, benefits and alternatives for the proposed anesthesia with the patient or authorized representative who has indicated his/her understanding and acceptance.     Dental Advisory Given  Plan Discussed with: CRNA  Anesthesia Plan Comments:         Anesthesia Quick Evaluation

## 2019-12-22 NOTE — Anesthesia Procedure Notes (Addendum)
Procedure Name: LMA Insertion Date/Time: 12/22/2019 2:52 PM Performed by: Vista Deck, CRNA Pre-anesthesia Checklist: Patient identified, Patient being monitored, Emergency Drugs available, Timeout performed and Suction available Patient Re-evaluated:Patient Re-evaluated prior to induction Oxygen Delivery Method: Circle System Utilized Preoxygenation: Pre-oxygenation with 100% oxygen Induction Type: IV induction LMA: LMA inserted LMA Size: 3.0 Number of attempts: 1 Placement Confirmation: positive ETCO2 and breath sounds checked- equal and bilateral Tube secured with: Tape Dental Injury: Teeth and Oropharynx as per pre-operative assessment  Comments: Attempted 4 LMA. Poor position. LMA 3 inserted easily. Good air exchange.

## 2019-12-22 NOTE — Transfer of Care (Addendum)
Immediate Anesthesia Transfer of Care Note  Patient: [name]  Procedure(s) Performed: CYSTOSCOPY WITH RIGHT RETROGRADE PYELOGRAM,RIGHT URETEROSCOPY (Right ) STONE EXTRACTION WITH BASKET (Right Ureter)  Patient Location: PACU  Anesthesia Type:General  Level of Consciousness: awake and patient cooperative  Airway & Oxygen Therapy: Patient Spontanous Breathing and non-rebreather face mask  Post-op Assessment: Report given to RN and Post -op Vital signs reviewed and stable  Post vital signs: Reviewed and stable  Last Vitals:  Vitals Value Taken Time  BP    Temp 98.1   Pulse    Resp    SpO2      Last Pain:  Vitals:   12/22/19 1334  TempSrc: Oral  PainSc: 5       Patients Stated Pain Goal: 5 (54/65/03 [id number])  Complications: No complications documented.

## 2019-12-22 NOTE — Discharge Instructions (Signed)
Ureteroscopy Ureteroscopy is a procedure to check for and treat problems inside part of the urinary tract. In this procedure, a thin, tube-shaped instrument with a light at the end (ureteroscope) is used to look at the inside of the kidneys and the ureters, which are the tubes that carry urine from the kidneys to the bladder. The ureteroscope is inserted into one or both of the ureters. You may need this procedure if you have frequent urinary tract infections (UTIs), blood in your urine, or a stone in one of your ureters. A ureteroscopy can be done to find the cause of urine blockage in a ureter and to evaluate other abnormalities inside the ureters or kidneys. If stones are found, they can be removed during the procedure. Polyps, abnormal tissue, and some types of tumors can also be removed or treated. The ureteroscope may also have a tool to remove tissue to be checked for disease under a microscope (biopsy). Tell a health care provider about:  Any allergies you have.  All medicines you are taking, including vitamins, herbs, eye drops, creams, and over-the-counter medicines.  Any problems you or family members have had with anesthetic medicines.  Any blood disorders you have.  Any surgeries you have had.  Any medical conditions you have.  Whether you are pregnant or may be pregnant. What are the risks? Generally, this is a safe procedure. However, problems may occur, including:  Bleeding.  Infection.  Allergic reactions to medicines.  Scarring that narrows the ureter (stricture).  Creating a hole in the ureter (perforation). What happens before the procedure? Staying hydrated Follow instructions from your health care provider about hydration, which may include:  Up to 2 hours before the procedure - you may continue to drink clear liquids, such as water, clear fruit juice, black coffee, and plain tea. Eating and drinking restrictions Follow instructions from your health care  provider about eating and drinking, which may include:  8 hours before the procedure - stop eating heavy meals or foods such as meat, fried foods, or fatty foods.  6 hours before the procedure - stop eating light meals or foods, such as toast or cereal.  6 hours before the procedure - stop drinking milk or drinks that contain milk.  2 hours before the procedure - stop drinking clear liquids. Medicines  Ask your health care provider about: ? Changing or stopping your regular medicines. This is especially important if you are taking diabetes medicines or blood thinners. ? Taking medicines such as aspirin and ibuprofen. These medicines can thin your blood. Do not take these medicines before your procedure if your health care provider instructs you not to.  You may be given antibiotic medicine to help prevent infection. General instructions  You may have a urine sample taken to check for infection.  Plan to have someone take you home from the hospital or clinic. What happens during the procedure?   To reduce your risk of infection: ? Your health care team will wash or sanitize their hands. ? Your skin will be washed with soap.  An IV tube will be inserted into one of your veins.  You will be given one of the following: ? A medicine to help you relax (sedative). ? A medicine to make you fall asleep (general anesthetic). ? A medicine that is injected into your spine to numb the area below and slightly above the injection site (spinal anesthetic).  To lower your risk of infection, you may be given an antibiotic medicine  by an injection or through the IV tube.  The opening from which you urinate (urethra) will be cleaned with a germ-killing solution.  The ureteroscope will be passed through your urethra into your bladder.  A salt-water solution will flow through the ureteroscope to fill your bladder. This will help the health care provider see the openings of your ureters more  clearly.  Then, the ureteroscope will be passed into your ureter. ? If a growth is found, a piece of it may be removed so it can be examined under a microscope (biopsy). ? If a stone is found, it may be removed through the ureteroscope, or the stone may be broken up using a laser, shock waves, or electrical energy. ? In some cases, if the ureter is too small, a tube may be inserted that keeps the ureter open (ureteral stent). The stent may be left in place for 1 or 2 weeks to keep the ureter open, and then the ureteroscopy procedure will be performed.  The scope will be removed, and your bladder will be emptied. The procedure may vary among health care providers and hospitals. What happens after the procedure?  Your blood pressure, heart rate, breathing rate, and blood oxygen level will be monitored until the medicines you were given have worn off.  You may be asked to urinate.  Donot drive for 24 hours if you were given a sedative. This information is not intended to replace advice given to you by your health care provider. Make sure you discuss any questions you have with your health care provider. Document Revised: 02/13/2017 Document Reviewed: 12/14/2015 Elsevier Patient Education  2020 [employer].

## 2019-12-22 NOTE — Interval H&P Note (Signed)
History and Physical Interval Note:  12/22/2019 2:05 PM  [name]  has presented today for surgery, with the diagnosis of right renal calculus.  The various methods of treatment have been discussed with the patient and family. After consideration of risks, benefits and other options for treatment, the patient has consented to  Procedure(s): CYSTOSCOPY WITH RIGHT RETROGRADE PYELOGRAM,RIGHT URETEROSCOPY AND RIGHT URETERAL STENT PLACEMENT (Right) HOLMIUM LASER APPLICATION as a surgical intervention.  The patient's history has been reviewed, patient examined, no change in status, stable for surgery.  I have reviewed the patient's chart and labs.  Questions were answered to the patient's satisfaction.     Nicolette Bang

## 2019-12-23 ENCOUNTER — Other Ambulatory Visit: Payer: Self-pay | Admitting: *Deleted

## 2019-12-23 LAB — GLUCOSE, CAPILLARY: Glucose-Capillary: 107 mg/dL — ABNORMAL HIGH (ref 70–99)

## 2019-12-23 MED ORDER — LISINOPRIL 5 MG PO TABS
5.0000 mg | ORAL_TABLET | Freq: Every day | ORAL | 2 refills | Status: DC
Start: 2019-12-23 — End: 2020-06-12

## 2019-12-23 NOTE — Anesthesia Postprocedure Evaluation (Signed)
Anesthesia Post Note  Patient: [name]  Procedure(s) Performed: CYSTOSCOPY WITH RIGHT URETERAL STENT REMOVAL; RIGHT RETROGRADE PYELOGRAM,RIGHT URETEROSCOPY (Right ) STONE EXTRACTION WITH BASKET (Right Ureter)  Patient location during evaluation: PACU Anesthesia Type: General Level of consciousness: awake Pain management: pain level controlled Vital Signs Assessment: post-procedure vital signs reviewed and stable Respiratory status: spontaneous breathing Cardiovascular status: blood pressure returned to baseline Postop Assessment: no headache Anesthetic complications: no   No complications documented.   Last Vitals:  Vitals:   12/22/19 1615 12/22/19 1626  BP: (!) 150/66 (!) 153/75  Pulse: 79 82  Resp: 13 16  Temp:  36.8 C  SpO2: 100% 97%    Last Pain:  Vitals:   12/22/19 1626  TempSrc: Oral  PainSc: [place]

## 2019-12-26 ENCOUNTER — Telehealth: Payer: Self-pay | Admitting: Registered Nurse

## 2019-12-26 ENCOUNTER — Encounter (HOSPITAL_COMMUNITY): Payer: Self-pay | Admitting: Urology

## 2019-12-26 NOTE — Telephone Encounter (Signed)
Return [name], PMP was reviewed she was prescribed Hydrocodone by Dr Alyson Ingles. Left message for [name] to return the Hopkins.

## 2019-12-26 NOTE — Telephone Encounter (Signed)
Return [name] call, she was instructed not to take the Hydrocodone. Her prescribed medications are MS Contin and MSIR, she reports her pain is being controlled with the above medication.   She is S/P Cystoscopy on 12/22/2019.

## 2019-12-27 LAB — CALCULI, WITH PHOTOGRAPH (CLINICAL LAB)
Calcium Oxalate Dihydrate: 70 %
Calcium Oxalate Monohydrate: 20 %
Hydroxyapatite: 10 %
Weight Calculi: 60 mg

## 2019-12-29 ENCOUNTER — Other Ambulatory Visit: Payer: Self-pay

## 2019-12-29 ENCOUNTER — Encounter: Payer: Self-pay | Admitting: Urology

## 2019-12-29 ENCOUNTER — Ambulatory Visit (INDEPENDENT_AMBULATORY_CARE_PROVIDER_SITE_OTHER): Payer: Medicare Other | Admitting: Urology

## 2019-12-29 VITALS — BP 158/71 | HR 79 | Temp 98.4°F | Ht 64.0 in | Wt 227.7 lb

## 2019-12-29 DIAGNOSIS — I2510 Atherosclerotic heart disease of native coronary artery without angina pectoris: Secondary | ICD-10-CM | POA: Diagnosis not present

## 2019-12-29 DIAGNOSIS — N200 Calculus of kidney: Secondary | ICD-10-CM

## 2019-12-29 MED ORDER — PROMETHAZINE HCL 25 MG PO TABS: 25.0000 mg | ORAL_TABLET | Freq: Four times a day (QID) | ORAL | 0 refills | Status: DC | PRN

## 2019-12-29 NOTE — Progress Notes (Signed)
Urological Symptom Review  Patient is experiencing the following symptoms: Frequent urination Hard to postpone urination Get up at night to urinate   Review of Systems  Gastrointestinal (upper)  : Nausea Indigestion/heartburn  Gastrointestinal (lower) : Negative for lower GI symptoms  Constitutional : Negative for symptoms  Skin: Negative for skin symptoms  Eyes: Negative for eye symptoms  Ear/Nose/Throat : Negative for Ear/Nose/Throat symptoms  Hematologic/Lymphatic: Negative for Hematologic/Lymphatic symptoms  Cardiovascular : Leg swelling  Respiratory : Shortness of breath  Endocrine: Excessive thirst  Musculoskeletal: Back pain  Neurological: Headaches  Psychologic: Depression

## 2019-12-29 NOTE — Patient Instructions (Signed)

## 2019-12-29 NOTE — Progress Notes (Signed)
12/29/2019 11:19 AM   [name] 1950-09-21 [id number]  Referring provider: Glenda Chroman, MD [place],  [place] [id number]  followup nephrolithiasis  HPI: [name] is a 02IO here for followup for nephrolithiasis. She underwent right ureteral stone extraction on 10/7. No issues since surgery. No flank pain. No LUTS. No issues with constipation.   PMH: Past Medical History:  Diagnosis Date  . Anxiety and depression   . Arthritis   . CAD (coronary artery disease)    Stent placement circumflex coronary 2007, catheterization 2008 patent stents. Normal LV function  . Chest pain   . CHF (congestive heart failure) ([place])   . COPD (chronic obstructive pulmonary disease) (H[facility])    no home O2  . Depression   . Diabetes mellitus    Insulin dependent  . Diabetic polyneuropathy (H[facility])     severe on multiple medications  . Dyslipidemia   . GERD (gastroesophageal reflux disease)   . Headache(784.0)   . Heart attack ([place])   . Heartburn   . 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 High cholesterol   . Hx of blood clots   . Hypertension   . MI (myocardial infarction) ([place])    2007  . Obstructive sleep apnea    CPAP, setting ?2  . Palpitations   . Sleep apnea   . Tachycardia     Surgical History: Past Surgical History:  Procedure Laterality Date  . ABDOMINAL HYSTERECTOMY    . Arm Surgery Bilateral    due to fall-pt broke both forearms  . BIOPSY N/A 07/01/2013   Procedure: BIOPSY;  Surgeon: Rogene Houston, MD;  Location: [facility];  Service: Endoscopy;  Laterality: N/A;  . CHOLECYSTECTOMY    . COLONOSCOPY  08/06/2011   Procedure: COLONOSCOPY;  Surgeon: Rogene Houston, MD;  [facility];  Service: Endoscopy;  Laterality: N/A;  215  . COLONOSCOPY WITH PROPOFOL N/A 12/31/2012   Procedure: COLONOSCOPY WITH PROPOFOL;  Surgeon: Rogene Houston, MD;  Location: [facility];  Service: Endoscopy;  Laterality: N/A;  in cecum at 0814, total withdrawal time 2mn  . COLONOSCOPY WITH PROPOFOL  N/A 02/05/2018   Procedure: COLONOSCOPY WITH PROPOFOL;  Surgeon: RRogene Houston MD;  [facility];  Service: Endoscopy;  Laterality: N/A;  1:25  . CORONARY ANGIOPLASTY WITH STENT PLACEMENT    . CYSTOSCOPY W/ URETERAL STENT PLACEMENT [facility]   [facility] . CYSTOSCOPY WITH RETROGRADE PYELOGRAM, URETEROSCOPY AND STENT PLACEMENT Right 12/22/2019   Procedure: CYSTOSCOPY WITH RIGHT URETERAL STENT REMOVAL; RIGHT RETROGRADE PYELOGRAM,RIGHT URETEROSCOPY;  Surgeon: MCleon Gustin MD;  Location: [facility];  Service: Urology;  Laterality: Right;  . ESOPHAGEAL DILATION N/A 06/07/2015   Procedure: ESOPHAGEAL DILATION;  Surgeon: NRogene Houston MD;  [facility];  Service: Endoscopy;  Laterality: N/A;  . ESOPHAGOGASTRODUODENOSCOPY N/A 06/07/2015   Procedure: ESOPHAGOGASTRODUODENOSCOPY (EGD);  Surgeon: NRogene Houston MD;  [facility];  Service: Endoscopy;  Laterality: N/A;  2:45 - moved to 1:00 - Ann to notify pt  . ESOPHAGOGASTRODUODENOSCOPY (EGD) WITH PROPOFOL N/A 12/31/2012   Procedure: ESOPHAGOGASTRODUODENOSCOPY (EGD) WITH PROPOFOL;  Surgeon: NRogene Houston MD;  [facility]: [facility];  Service: Endoscopy;  Laterality: N/A;  GE junction 37,  . ESOPHAGOGASTRODUODENOSCOPY (EGD) WITH PROPOFOL N/A 07/01/2013   Procedure: ESOPHAGOGASTRODUODENOSCOPY (EGD) WITH PROPOFOL;  Surgeon: NRogene Houston MD;  [facility]: [facility];  Service: Endoscopy;  Laterality: N/A;  950  . MALONEY DILATION N/A 12/31/2012  Procedure: MALONEY DILATION;  Surgeon: Rogene Houston, MD;  Location: [facility];  Service: Endoscopy;  Laterality: N/A;  used a # 54,#56  . MALONEY DILATION N/A 07/01/2013   Procedure: Venia Minks DILATION;  Surgeon: Rogene Houston, MD;  Location: [facility];  Service: Endoscopy;  Laterality: N/A;  54/58; no heme present  . POLYPECTOMY  02/05/2018   Procedure: POLYPECTOMY;  Surgeon: Rogene Houston, MD;  [facility];  Service: Endoscopy;;  distal rectum (HS)  . STONE  EXTRACTION WITH BASKET Right 12/22/2019   Procedure: STONE EXTRACTION WITH BASKET;  Surgeon: Cleon Gustin, MD;  Location: [facility];  Service: Urology;  Laterality: Right;    Home Medications:  Allergies as of 12/29/2019      Reactions   Amphetamine-dextroamphetamine Swelling   Nitrofuran Derivatives Itching, Swelling   Amphetamine-dextroamphet Er Swelling   Pregabalin Swelling   Topiramate Other (See Comments)   Tongue tingle   Verelan [verapamil] Rash      Medication List       Accurate as of December 29, 2019 11:19 AM. If you have any questions, ask your nurse or doctor.        Armodafinil 150 MG tablet Take 150 mg by mouth daily.   aspirin EC 81 MG tablet Take 1 tablet (81 mg total) by mouth daily.   atorvastatin 40 MG tablet Commonly known as: LIPITOR Take 40 mg by mouth daily.   B-D ULTRAFINE III SHORT PEN 31G X 8 MM Misc Generic drug: Insulin Pen Needle Inject into the skin 4 (four) times daily.   buPROPion 100 MG 12 hr tablet Commonly known as: WELLBUTRIN SR Take 100 mg by mouth daily.   clotrimazole-betamethasone cream Commonly known as: Lotrisone Apply to affected area 2 times daily for 21 days   diclofenac Sodium 1 % Gel Commonly known as: VOLTAREN APPLY TO BOTH KNEES THREE TIMES DAILY What changed: See the new instructions.   diltiazem 240 MG 24 hr capsule Commonly known as: CARDIZEM CD Take 240 mg by mouth daily.   doxepin 25 MG capsule Commonly known as: SINEQUAN Take 75 mg by mouth at bedtime.   Eliquis 5 MG Tabs tablet Generic drug: apixaban Take 5 mg by mouth in the morning and at bedtime.   estradiol 0.5 MG tablet Commonly known as: ESTRACE Take 0.5 mg by mouth daily.   FeroSul 325 (65 FE) MG tablet Generic drug: ferrous sulfate Take 325 mg by mouth every morning.   fluconazole 150 MG tablet Commonly known as: Diflucan Take 1 tablet (150 mg total) by mouth daily.   furosemide 20 MG tablet Commonly known as: LASIX Take 20  mg by mouth daily.   gabapentin 400 MG capsule Commonly known as: NEURONTIN Take 400 mg by mouth 3 (three) times daily.   glipiZIDE 5 MG 24 hr tablet Commonly known as: GLUCOTROL XL TAKE 1 TABLET BY MOUTH EVERY DAY WITH BREAKFAST What changed: See the new instructions.   HYDROcodone-acetaminophen 5-325 MG tablet Commonly known as: Norco Take 1 tablet by mouth every 6 (six) hours as needed for moderate pain.   lisinopril 5 MG tablet Commonly known as: ZESTRIL Take 1 tablet (5 mg total) by mouth daily.   LORazepam 0.5 MG tablet Commonly known as: ATIVAN Take 0.5 mg by mouth 2 (two) times daily as needed for anxiety.   meloxicam 7.5 MG tablet Commonly known as: MOBIC Take 7.5 mg by mouth in the morning and at bedtime.   metFORMIN 500 MG tablet Commonly  known as: GLUCOPHAGE TAKE 1 TABLET BY MOUTH TWICE DAILY What changed: when to take this   metoCLOPramide 5 MG tablet Commonly known as: REGLAN Take 5 mg by mouth 3 (three) times daily as needed for nausea or vomiting.   metoprolol succinate 100 MG 24 hr tablet Commonly known as: TOPROL-XL Take 100 mg by mouth daily. Take with or immediately following a meal.   montelukast 10 MG tablet Commonly known as: SINGULAIR Take 10 mg by mouth at bedtime.   morphine 15 MG tablet Commonly known as: MSIR Take 1 tablet (15 mg total) by mouth daily as needed for severe pain. Do Not Fill Before 11/25/2019   morphine 30 MG 12 hr tablet Commonly known as: Emily CONTIN Take 1 tablet (30 mg total) by mouth every 12 (twelve) hours.   nitroGLYCERIN 0.4 MG SL tablet Commonly known as: NITROSTAT Place 1 tablet (0.4 mg total) under the tongue every 5 (five) minutes x 3 doses as needed. For chest pains   ondansetron 4 MG tablet Commonly known as: ZOFRAN Take 4 mg by mouth every 6 (six) hours as needed for nausea or vomiting.   pantoprazole 40 MG tablet Commonly known as: PROTONIX TAKE 1 TABLET BY MOUTH TWICE DAILY BEFORE MEALS     polyethylene glycol powder 17 GM/SCOOP powder Commonly known as: GLYCOLAX/MIRALAX Take 1 Container by mouth daily as needed for moderate constipation.   potassium chloride 10 MEQ tablet Commonly known as: KLOR-CON Take 10 mEq by mouth 2 (two) times daily.   ProAir HFA 108 (90 Base) MCG/ACT inhaler Generic drug: albuterol Inhale 2 puffs into the lungs every 4 (four) hours as needed for wheezing or shortness of breath.   promethazine 25 MG tablet Commonly known as: PHENERGAN Take 1 tablet (25 mg total) by mouth 4 (four) times daily as needed.   sucralfate 1 g tablet Commonly known as: CARAFATE Take 1 g by mouth 4 (four) times daily.   tamsulosin 0.4 MG Caps capsule Commonly known as: FLOMAX Take 0.4 mg by mouth daily.   topiramate 200 MG tablet Commonly known as: TOPAMAX Take 200 mg by mouth at bedtime.   Tyler Aas FlexTouch 100 UNIT/ML FlexTouch Pen Generic drug: insulin degludec INJECT 60 UNITS INTO THE SKIN AT BEDTIME What changed: See the new instructions.   Vitamin D3 125 MCG (5000 UT) Caps TAKE ONE CAPSULE BY MOUTH DAILY       Allergies:  Allergies  Allergen Reactions  . Amphetamine-Dextroamphetamine Swelling  . Nitrofuran Derivatives Itching and Swelling  . Amphetamine-Dextroamphet Er Swelling  . Pregabalin Swelling  . Topiramate Other (See Comments)    Tongue tingle  . Verelan [Verapamil] Rash    Family History: Family History  Problem Relation Age of Onset  . Heart attack Mother   . Colon cancer Mother   . Heart attack Father   . Stroke Sister   . Hemophilia Child   . Cancer Other   . Coronary artery disease Other   . Cancer Brother   . Cancer Maternal Aunt   . Cancer Maternal Uncle   . Cancer Paternal Aunt   . Cancer Paternal Uncle   . Cancer Brother     Social History:  reports that she has never smoked. She has never used smokeless tobacco. She reports that she does not drink alcohol and does not use drugs.  ROS: All other review of  systems were reviewed and are negative except what is noted above in HPI  Physical Exam: BP (!) 158/71  Pulse 79   Temp 98.4 F (36.9 C)   Ht 5' 4"  (1.626 m)   Wt 227 lb 11.8 oz (103.3 kg)   BMI 39.09 kg/m   Constitutional:  Alert and oriented, No acute distress. HEENT: [place] AT, moist mucus membranes.  Trachea midline, no masses. Cardiovascular: No clubbing, cyanosis, or edema. Respiratory: Normal respiratory effort, no increased work of breathing. GI: Abdomen is soft, nontender, nondistended, no abdominal masses GU: No CVA tenderness.  Lymph: No cervical or inguinal lymphadenopathy. Skin: No rashes, bruises or suspicious lesions. Neurologic: Grossly intact, no focal deficits, moving all 4 extremities. Psychiatric: Normal mood and affect.  Laboratory Data: Lab Results  Component Value Date   WBC 7.8 12/07/2017   HGB 12.6 12/07/2017   HCT 39.6 12/07/2017   MCV 83.4 12/07/2017   PLT 369 12/07/2017    Lab Results  Component Value Date   CREATININE 0.66 10/27/2019    No results found for: PSA  No results found for: TESTOSTERONE  Lab Results  Component Value Date   HGBA1C 9.7 (H) 10/27/2019    Urinalysis    Component Value Date/Time   COLORURINE YELLOW 12/07/2017 1413   APPEARANCEUR Cloudy (A) 11/23/2019 1038   LABSPEC 1.015 12/07/2017 1413   PHURINE 7.0 12/07/2017 1413   GLUCOSEU Negative 11/23/2019 1038   HGBUR NEGATIVE 12/07/2017 1413   BILIRUBINUR Negative 11/23/2019 1038   KETONESUR NEGATIVE 12/07/2017 1413   PROTEINUR 2+ (A) 11/23/2019 1038   PROTEINUR NEGATIVE 12/07/2017 1413   UROBILINOGEN 0.2 12/02/2012 1520   NITRITE Negative 11/23/2019 1038   NITRITE NEGATIVE 12/07/2017 1413   LEUKOCYTESUR Trace (A) 11/23/2019 1038    Lab Results  Component Value Date   LABMICR Comment 11/23/2019   WBCUA >30 (A) 10/10/2019   LABEPIT 0-10 10/10/2019   BACTERIA Moderate (A) 10/10/2019    Pertinent Imaging:  No results found for this or any previous  visit.  No results found for this or any previous visit.  No results found for this or any previous visit.  No results found for this or any previous visit.  No results found for this or any previous visit.  No results found for this or any previous visit.  No results found for this or any previous visit.  Results for orders placed during the hospital encounter of 10/10/19  CT RENAL STONE STUDY  Narrative CLINICAL DATA:  Follow-up renal calculi.  EXAM: CT ABDOMEN AND PELVIS WITHOUT CONTRAST  TECHNIQUE: Multidetector CT imaging of the abdomen and pelvis was performed following the standard protocol without IV contrast.  COMPARISON:  CT scan 08/22/2019  FINDINGS: Lower chest: Streaky scarring changes and atelectasis. No worrisome pulmonary lesions or acute pulmonary findings. No pleural effusion. The heart is normal in size. No pericardial effusion. Coronary artery and aortic calcifications are noted.  Hepatobiliary: No hepatic lesions or intrahepatic biliary dilatation. The gallbladder is surgically absent. No common bile duct dilatation.  Pancreas: No mass, inflammation or ductal dilatation.  Spleen: Normal size. No focal lesions. Evidence of prior splenic infarct. Stable dense calcification in the splenic hilum.  Adrenals/Urinary Tract: Stable 2 cm left adrenal gland nodule measuring 13.5 Hounsfield units and most consistent with a benign adenoma. The right adrenal gland is normal.  The 3.5 mm calculus noted in the right renal pelvis on the prior study is now and a mid to lower pole calyx anteriorly. Another small lower pole calculus is noted. No obstructing ureteral calculi. No left-sided renal or ureteral calculi. No worrisome renal lesions are  seen without contrast. The bladder is unremarkable. It is largely decompressed.  Stomach/Bowel: The stomach, duodenum, small bowel and colon are grossly normal without oral contrast. No acute inflammatory  process, mass lesions or obstructive findings. The terminal ileum is normal. The appendix is normal.  Vascular/Lymphatic: Stable age advanced atherosclerotic calcifications involving the aorta and iliac arteries. No aneurysm. No mesenteric or retroperitoneal mass or adenopathy.  Reproductive: The uterus is surgically absent. Both ovaries are still present and appear normal.  Other: No pelvic mass or adenopathy. No free pelvic fluid collections. No inguinal mass or adenopathy. No abdominal wall hernia or subcutaneous lesions.  Musculoskeletal: No significant bony findings. Moderate lumbar facet disease noted.  IMPRESSION: 1. 3.5 mm right renal calculus is now in a mid to lower pole calyx anteriorly. Another small lower pole calculus is noted. No obstructing ureteral calculi or bladder calculi. 2. No worrisome renal or bladder lesions without contrast. 3. Stable 2 cm left adrenal gland nodule, most consistent with a benign adenoma. 4. Remote splenic infarct. 5. Status post cholecystectomy. No biliary dilatation. 6. Stable age advanced atherosclerotic calcifications involving the aorta and iliac arteries.  Aortic Atherosclerosis (ICD10-I70.0).   Electronically Signed By: Marijo Sanes M.D. On: 10/10/2019 11:52   Assessment & Plan:    1. Nephrolithiasis -RTC 4 weeks with a renal US   No follow-ups on file.  Nicolette Bang, MD  [facility] [place]

## 2020-01-12 ENCOUNTER — Other Ambulatory Visit: Payer: Self-pay | Admitting: Physical Medicine & Rehabilitation

## 2020-01-24 ENCOUNTER — Encounter: Payer: Medicare Other | Attending: Registered Nurse | Admitting: Registered Nurse

## 2020-01-24 ENCOUNTER — Other Ambulatory Visit: Payer: Self-pay

## 2020-01-24 ENCOUNTER — Encounter: Payer: Self-pay | Admitting: Registered Nurse

## 2020-01-24 VITALS — BP 134/78 | HR 78 | Temp 98.9°F | Ht 64.0 in | Wt 234.4 lb

## 2020-01-24 DIAGNOSIS — G894 Chronic pain syndrome: Secondary | ICD-10-CM

## 2020-01-24 DIAGNOSIS — M48062 Spinal stenosis, lumbar region with neurogenic claudication: Secondary | ICD-10-CM | POA: Diagnosis present

## 2020-01-24 DIAGNOSIS — M546 Pain in thoracic spine: Secondary | ICD-10-CM | POA: Diagnosis present

## 2020-01-24 DIAGNOSIS — Z5181 Encounter for therapeutic drug level monitoring: Secondary | ICD-10-CM

## 2020-01-24 DIAGNOSIS — M1712 Unilateral primary osteoarthritis, left knee: Secondary | ICD-10-CM | POA: Diagnosis present

## 2020-01-24 DIAGNOSIS — M1711 Unilateral primary osteoarthritis, right knee: Secondary | ICD-10-CM

## 2020-01-24 DIAGNOSIS — M5412 Radiculopathy, cervical region: Secondary | ICD-10-CM | POA: Diagnosis present

## 2020-01-24 DIAGNOSIS — M542 Cervicalgia: Secondary | ICD-10-CM | POA: Diagnosis present

## 2020-01-24 DIAGNOSIS — M7061 Trochanteric bursitis, right hip: Secondary | ICD-10-CM | POA: Diagnosis present

## 2020-01-24 DIAGNOSIS — Z79891 Long term (current) use of opiate analgesic: Secondary | ICD-10-CM | POA: Diagnosis present

## 2020-01-24 DIAGNOSIS — M7062 Trochanteric bursitis, left hip: Secondary | ICD-10-CM | POA: Diagnosis present

## 2020-01-24 DIAGNOSIS — I2510 Atherosclerotic heart disease of native coronary artery without angina pectoris: Secondary | ICD-10-CM

## 2020-01-24 DIAGNOSIS — M5416 Radiculopathy, lumbar region: Secondary | ICD-10-CM | POA: Diagnosis present

## 2020-01-24 MED ORDER — MORPHINE SULFATE ER 30 MG PO TBCR
30.0000 mg | EXTENDED_RELEASE_TABLET | Freq: Two times a day (BID) | ORAL | 0 refills | Status: DC
Start: 2020-01-24 — End: 2020-02-23

## 2020-01-24 NOTE — Progress Notes (Signed)
Subjective:    Patient ID: [name], female    DOB: [birth year], 69 y.o.   MRN: [id number]  HPI: [name] is a 69 y.o. female who returns for follow up appointment for chronic pain and medication refill. She states her pain is located in her neck radiating into her bilateral shoulders, upper- lower back pain radiating into her bilateral hips, bilateral lower extremities and bilateral feet pain with tingling and burning. Also reports bilateral knee pain and generalized joint pain. She rates her pain 5. Her current exercise regime is walking and performing stretching exercises.  [name] underwent by Dr Alyson Ingles on 12/22/2019.  CYSTOSCOPY WITH RIGHT URETERAL STENT REMOVAL; RIGHT RETROGRADE PYELOGRAM,RIGHT URETEROSCOPY Right General  STONE EXTRACTION WITH BASKET Right    [name] Morphine equivalent is  60.00 MME.She is also prescribed Alprazolam by Dr. Woody Seller. We have discussed the black box warning of using opioids and benzodiazepines. I highlighted the dangers of using these drugs together and discussed the adverse events including respiratory suppression, overdose, cognitive impairment and importance of compliance with current regimen. We will continue to monitor and adjust as indicated.  She  is being closely monitored and under the care of her psychiatrist Dr Reece Levy.  Oral Swab was Performed Today.    Pain Inventory Average Pain 5 Pain Right Now 5 My pain is constant, stabbing and aching  In the last 24 hours, has pain interfered with the following? General activity 3 Relation with others 0 Enjoyment of life 3 What TIME of day is your pain at its worst? morning  and night Sleep (in general) Good  Pain is worse with: walking, bending and standing Pain improves with: rest and medication Relief from Meds: 5  Family History  Problem Relation Age of Onset  . Heart attack Mother   . Colon cancer Mother   . Heart attack Father   . Stroke Sister   . Hemophilia Child    . Cancer Other   . Coronary artery disease Other   . Cancer Brother   . Cancer Maternal Aunt   . Cancer Maternal Uncle   . Cancer Paternal Aunt   . Cancer Paternal Uncle   . Cancer Brother    Social History   Socioeconomic History  . Marital status: Divorced    Spouse name: Not on file  . Number of children: 4  . Years of education: Not on file  . Highest education level: Not on file  Occupational History  . Occupation: Disabled  Tobacco Use  . Smoking status: Never Smoker  . Smokeless tobacco: Never Used  Vaping Use  . Vaping Use: Never used  Substance and Sexual Activity  . Alcohol use: No    Alcohol/week: 0.0 standard drinks  . Drug use: No  . Sexual activity: Not on file  Other Topics Concern  . Not on file  Social History Narrative   Patient does not get regular exercise   Denies caffeine use    Social Determinants of Health   Financial Resource Strain:   . Difficulty of Paying Living Expenses: Not on file  Food Insecurity:   . Worried About Charity fundraiser in the Last Year: Not on file  . Ran Out of Food in the Last Year: Not on file  Transportation Needs:   . Lack of Transportation (Medical): Not on file  . Lack of Transportation (Non-Medical): Not on file  Physical Activity:   . Days of Exercise per Week: Not on  file  . Minutes of Exercise per Session: Not on file  Stress:   . Feeling of Stress : Not on file  Social Connections:   . Frequency of Communication with Friends and Family: Not on file  . Frequency of Social Gatherings with Friends and Family: Not on file  . Attends Religious Services: Not on file  . Active Member of Clubs or Organizations: Not on file  . Attends Archivist Meetings: Not on file  . Marital Status: Not on file   Past Surgical History:  Procedure Laterality Date  . ABDOMINAL HYSTERECTOMY    . Arm Surgery Bilateral    due to fall-pt broke both forearms  . BIOPSY N/A 07/01/2013   Procedure: BIOPSY;  Surgeon:  Emily Houston, MD;  [facility]: [facility];  Service: Endoscopy;  Laterality: N/A;  . CHOLECYSTECTOMY    . COLONOSCOPY  08/06/2011   Procedure: COLONOSCOPY;  Surgeon: Emily Houston, MD;  [facility];  Service: Endoscopy;  Laterality: N/A;  215  . COLONOSCOPY WITH PROPOFOL N/A 12/31/2012   Procedure: COLONOSCOPY WITH PROPOFOL;  Surgeon: Emily Houston, MD;  Location: [facility];  Service: Endoscopy;  Laterality: N/A;  in cecum at 0814, total withdrawal time 52mn  . COLONOSCOPY WITH PROPOFOL N/A 02/05/2018   Procedure: COLONOSCOPY WITH PROPOFOL;  Surgeon: Emily Houston MD;  [facility];  Service: Endoscopy;  Laterality: N/A;  1:25  . CORONARY ANGIOPLASTY WITH STENT PLACEMENT    . CYSTOSCOPY W/ URETERAL STENT PLACEMENT Right [facility]   [facility] CYSTOSCOPY WITH RETROGRADE PYELOGRAM, URETEROSCOPY AND STENT PLACEMENT Right 12/22/2019   Procedure: CYSTOSCOPY WITH RIGHT URETERAL STENT REMOVAL; RIGHT RETROGRADE PYELOGRAM,RIGHT URETEROSCOPY;  Surgeon: Emily Gustin MD;  [facility]: [facility];  Service: Urology;  Laterality: Right;  . ESOPHAGEAL DILATION N/A 06/07/2015   Procedure: ESOPHAGEAL DILATION;  Surgeon: Emily Houston MD;  [facility];  Service: Endoscopy;  Laterality: N/A;  . ESOPHAGOGASTRODUODENOSCOPY N/A 06/07/2015   Procedure: ESOPHAGOGASTRODUODENOSCOPY (EGD);  Surgeon: Emily Houston MD;  [facility];  Service: Endoscopy;  Laterality: N/A;  2:45 - moved to 1:00 - Ann to notify pt  . ESOPHAGOGASTRODUODENOSCOPY (EGD) WITH PROPOFOL N/A 12/31/2012   Procedure: ESOPHAGOGASTRODUODENOSCOPY (EGD) WITH PROPOFOL;  Surgeon: Emily Houston MD;  [facility];  Service: Endoscopy;  Laterality: N/A;  GE junction 37,  . ESOPHAGOGASTRODUODENOSCOPY (EGD) WITH PROPOFOL N/A 07/01/2013   Procedure: ESOPHAGOGASTRODUODENOSCOPY (EGD) WITH PROPOFOL;  Surgeon: Emily Houston MD;  [facility];  Service: Endoscopy;  Laterality: N/A;  950  . MALONEY  DILATION N/A 12/31/2012   Procedure: MALONEY DILATION;  Surgeon: Emily Houston MD;  [facility];  Service: Endoscopy;  Laterality: N/A;  used a # 54,#56  . MALONEY DILATION N/A 07/01/2013   Procedure: Emily MinksDILATION;  Surgeon: Emily Houston MD;  [facility];  Service: Endoscopy;  Laterality: N/A;  54/58; no heme present  . POLYPECTOMY  02/05/2018   Procedure: POLYPECTOMY;  Surgeon: Emily Houston MD;  [facility];  Service: Endoscopy;;  distal rectum (HS)  . STONE EXTRACTION WITH BASKET Right 12/22/2019   Procedure: STONE EXTRACTION WITH BASKET;  Surgeon: Emily Gustin MD;  [facility];  Service: Urology;  Laterality: Right;   Past Surgical History:  Procedure Laterality Date  . ABDOMINAL HYSTERECTOMY    . Arm Surgery Bilateral    due to fall-pt broke both forearms  . BIOPSY N/A 07/01/2013  Procedure: BIOPSY;  Surgeon: Emily Houston, MD;  [facility];  Service: Endoscopy;  Laterality: N/A;  . CHOLECYSTECTOMY    . COLONOSCOPY  08/06/2011   Procedure: COLONOSCOPY;  Surgeon: Emily Houston, MD;  [facility];  Service: Endoscopy;  Laterality: N/A;  215  . COLONOSCOPY WITH PROPOFOL N/A 12/31/2012   Procedure: COLONOSCOPY WITH PROPOFOL;  Surgeon: Emily Houston, MD;  [facility];  Service: Endoscopy;  Laterality: N/A;  in cecum at 0814, total withdrawal time 25mn  . COLONOSCOPY WITH PROPOFOL N/A 02/05/2018   Procedure: COLONOSCOPY WITH PROPOFOL;  Surgeon: Emily Houston MD;  [facility];  Service: Endoscopy;  Laterality: N/A;  1:25  . CORONARY ANGIOPLASTY WITH STENT PLACEMENT    . CYSTOSCOPY W/ URETERAL STENT PLACEMENT [facility]   [facility] CYSTOSCOPY WITH RETROGRADE PYELOGRAM, URETEROSCOPY AND STENT PLACEMENT Right 12/22/2019   Procedure: CYSTOSCOPY WITH RIGHT URETERAL STENT REMOVAL; RIGHT RETROGRADE PYELOGRAM,RIGHT URETEROSCOPY;  Surgeon: Emily Gustin MD;  [facility];  Service: Urology;   Laterality: Right;  . ESOPHAGEAL DILATION N/A 06/07/2015   Procedure: ESOPHAGEAL DILATION;  Surgeon: Emily Houston MD;  [facility];  Service: Endoscopy;  Laterality: N/A;  . ESOPHAGOGASTRODUODENOSCOPY N/A 06/07/2015   Procedure: ESOPHAGOGASTRODUODENOSCOPY (EGD);  Surgeon: Emily Houston MD;  [facility];  Service: Endoscopy;  Laterality: N/A;  2:45 - moved to 1:00 - Ann to notify pt  . ESOPHAGOGASTRODUODENOSCOPY (EGD) WITH PROPOFOL N/A 12/31/2012   Procedure: ESOPHAGOGASTRODUODENOSCOPY (EGD) WITH PROPOFOL;  Surgeon: Emily Houston MD;  [facility];  Service: Endoscopy;  Laterality: N/A;  GE junction 37,  . ESOPHAGOGASTRODUODENOSCOPY (EGD) WITH PROPOFOL N/A 07/01/2013   Procedure: ESOPHAGOGASTRODUODENOSCOPY (EGD) WITH PROPOFOL;  Surgeon: Emily Houston MD;  [facility];  Service: Endoscopy;  Laterality: N/A;  950  . MALONEY DILATION N/A 12/31/2012   Procedure: MALONEY DILATION;  Surgeon: Emily Houston MD;  [facility];  Service: Endoscopy;  Laterality: N/A;  used a # 54,#56  . MALONEY DILATION N/A 07/01/2013   Procedure: Emily MinksDILATION;  Surgeon: Emily Houston MD;  [facility];  Service: Endoscopy;  Laterality: N/A;  54/58; no heme present  . POLYPECTOMY  02/05/2018   Procedure: POLYPECTOMY;  Surgeon: Emily Houston MD;  [facility];  Service: Endoscopy;;  distal rectum (HS)  . STONE EXTRACTION WITH BASKET Right 12/22/2019   Procedure: STONE EXTRACTION WITH BASKET;  Surgeon: Emily Gustin MD;  [facility];  Service: Urology;  Laterality: Right;   Past Medical History:  Diagnosis Date  . Anxiety and depression   . Arthritis   . CAD (coronary artery disease)    Stent placement circumflex coronary 2007, catheterization 2008 patent stents. Normal LV function  . Chest pain   . CHF (congestive heart failure) ([facility]   . COPD (chronic obstructive pulmonary disease) (H[facility])    no home O2  . Depression   . Diabetes  mellitus    Insulin dependent  . Diabetic polyneuropathy (HCC)     severe on multiple medications  . Dyslipidemia   . GERD (gastroesophageal reflux disease)   . Headache(784.0)   . Heart attack ([facility]   . Heartburn   . 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 High cholesterol   . Hx of blood clots   . Hypertension   . [place]    [place]  . Obstructive sleep apnea    CPAP,  setting ?2  . Palpitations   . Sleep apnea   . Tachycardia    BP 134/78   Pulse 78   Temp 98.9 F (37.2 C)   Ht 5' 4"  (1.626 m)   Wt 234 lb 6.4 oz (106.3 kg)   SpO2 94%   BMI 40.23 kg/m   Opioid Risk Score:   Fall Risk Score:  `1  Depression screen PHQ 2/9  Depression screen [facility] 2/9 11/18/2019 10/26/2019 09/26/2019 03/24/2019 07/16/2018 06/12/2017 04/14/2017  Decreased Interest 1 0 0 1 1 3 3   Down, Depressed, Hopeless 1 0 0 1 1 3 3   PHQ - 2 Score 2 0 0 2 2 6 6   Altered sleeping - - - - - 3 -  Tired, decreased energy - - - - - 3 -  Change in appetite - - - - - 0 -  Feeling bad or failure about yourself  - - - - - 3 -  Trouble concentrating - - - - - 3 -  Moving slowly or fidgety/restless - - - - - 1 -  Suicidal thoughts - - - - - 0 -  PHQ-9 Score - - - - - 19 -  Difficult doing work/chores - - - - - Very difficult -  Some recent data might be hidden   Review of Systems  Musculoskeletal: Positive for back pain and gait problem.  All other systems reviewed and are negative.      Objective:   Physical Exam Vitals and nursing note reviewed.  Constitutional:      Appearance: Normal appearance.  Neck:     Comments: Cervical Paraspinal Tenderness: C-5-C-6 Cardiovascular:     Rate and Rhythm: Normal rate and regular rhythm.     Pulses: Normal pulses.     Heart sounds: Normal heart sounds.  Pulmonary:     Effort: Pulmonary effort is normal.     Breath sounds: Normal breath sounds.  Musculoskeletal:     Cervical back: Normal range of motion and neck supple.     Right lower leg: Edema present.      Left lower leg: Edema present.     Comments: Normal Muscle Bulk and Muscle Testing Reveals:  Upper Extremities: Full ROM and Muscle Strength 5/5 Thoracic and Lumbar Hypersensitivity Bilateral Greater Trochanter Tenderness Lower Extremities: Full ROM and Muscle Strength 5/5 Arrived in wheelchair   Skin:    General: Skin is warm and dry.  Neurological:     Mental Status: She is alert and oriented to person, place, and time.  Psychiatric:        Mood and Affect: Mood normal.        Behavior: Behavior normal.           Assessment & Plan:  1.Lumbar pain lumbar spondylosis: Encouraged to continue toincrease Activity as tolerated.01/24/2020. Continue current medication regimen. Continue: MSIR 15 mg one tablet daily as needed for breakthrough painand Refilled:MS Contin 30 MG one tablet every12hours #60. Boyfriend Joe is dispensing Ms. Boldin Medication she reports. We will continue the opioid monitoring program, this consists of regular clinic visits, examinations, urine drug screen, pill counts as well as use of [place] Controlled Substance Reporting system. A 12 month History has been reviewed on the [place] Controlled Substance Reporting Systemon 01/24/2020. 2. Lumbar Radiculitis: Continuecurrent medication regimen withGabapentin.01/24/2020 3. Bilateral Knee Pain/ Degenerative:Continue Voltaren Gel. Continue to Monitor.01/24/2020 4. Severe diabetic poly neuropathy: Continuecurrent medication regimen withGabapentin.01/24/2020 5. Insomnia: ContinueArmodafinil Psychiatry Following.01/24/2020 6.Anxiety/Depression:Continue Current Medication regimen as  prescribed by Psychiatry.01/24/2020 7. De Quervains Tenosynovitis: No Complaints Today.01/24/2020 8.BilateralGreater Trochanteric Bursitis:Continue with Ice and Heat Therapy. Continue to Monitor.01/24/2020. 9. Cervicalgia/ Cervical Radiculitis:Continue Gabapentin.Continue with HEP as Tolerated  and continue to monitor.01/24/2020 10. Chronic Bilateral Thoracic Back Pain: Continue HEP as Tolerated. Continue current medication regimen. Continue to monitor.01/24/2020.  20  minutes of face to face patient care time was spent during this visit. All questions were encouraged and answered.  F/U in 1 month

## 2020-01-27 LAB — DRUG TOX MONITOR 1 W/CONF, ORAL FLD

## 2020-01-27 LAB — DRUG TOX ALC METAB W/CON, ORAL FLD: Alcohol Metabolite: NEGATIVE ng/mL (ref <25)

## 2020-01-31 ENCOUNTER — Ambulatory Visit (HOSPITAL_COMMUNITY): Payer: Medicare Other | Attending: Urology

## 2020-02-02 ENCOUNTER — Telehealth: Payer: Self-pay | Admitting: *Deleted

## 2020-02-02 NOTE — Telephone Encounter (Signed)
Oral swab drug screen was consistent for prescribed medications.  ?

## 2020-02-03 ENCOUNTER — Ambulatory Visit: Payer: Medicare Other | Admitting: Urology

## 2020-02-23 ENCOUNTER — Encounter: Payer: Medicare Other | Attending: Registered Nurse | Admitting: Registered Nurse

## 2020-02-23 ENCOUNTER — Other Ambulatory Visit: Payer: Self-pay

## 2020-02-23 VITALS — BP 130/75 | HR 73 | Temp 98.6°F | Ht 64.0 in | Wt 239.4 lb

## 2020-02-23 DIAGNOSIS — M1711 Unilateral primary osteoarthritis, right knee: Secondary | ICD-10-CM

## 2020-02-23 DIAGNOSIS — M7061 Trochanteric bursitis, right hip: Secondary | ICD-10-CM

## 2020-02-23 DIAGNOSIS — I2510 Atherosclerotic heart disease of native coronary artery without angina pectoris: Secondary | ICD-10-CM

## 2020-02-23 DIAGNOSIS — Z79891 Long term (current) use of opiate analgesic: Secondary | ICD-10-CM

## 2020-02-23 DIAGNOSIS — M5412 Radiculopathy, cervical region: Secondary | ICD-10-CM

## 2020-02-23 DIAGNOSIS — M48062 Spinal stenosis, lumbar region with neurogenic claudication: Secondary | ICD-10-CM | POA: Diagnosis present

## 2020-02-23 DIAGNOSIS — M1712 Unilateral primary osteoarthritis, left knee: Secondary | ICD-10-CM | POA: Diagnosis present

## 2020-02-23 DIAGNOSIS — M7062 Trochanteric bursitis, left hip: Secondary | ICD-10-CM

## 2020-02-23 DIAGNOSIS — M5416 Radiculopathy, lumbar region: Secondary | ICD-10-CM

## 2020-02-23 DIAGNOSIS — M546 Pain in thoracic spine: Secondary | ICD-10-CM

## 2020-02-23 DIAGNOSIS — Z5181 Encounter for therapeutic drug level monitoring: Secondary | ICD-10-CM

## 2020-02-23 DIAGNOSIS — G894 Chronic pain syndrome: Secondary | ICD-10-CM

## 2020-02-23 DIAGNOSIS — M542 Cervicalgia: Secondary | ICD-10-CM

## 2020-02-23 MED ORDER — MORPHINE SULFATE ER 30 MG PO TBCR: 30.0000 mg | EXTENDED_RELEASE_TABLET | Freq: Two times a day (BID) | ORAL | 0 refills | Status: DC

## 2020-02-23 NOTE — Progress Notes (Signed)
Subjective:    Patient ID: [name], female    DOB: 08-05-1950, 69 y.o.   MRN: [id number]  HPI: MORNING HALBERG is a 69 y.o. female who returns for follow up appointment for chronic pain and medication refill. She states her pain is located in her neck radiating into her bilateral shoulders, upper to lower back pain radiating into her bilateral hips and bilateral lower extremities. Also reports bilateral knee pain R>L. He rates his pain 5.  His current exercise regime is walking and performing stretching exercises.  Ms. Muzio Morphine equivalent is 60.00 MME.    Last Oral Swab was Performed on 01/24/2020, see note for details.  Pain Inventory  Average Pain 5 Pain Right Now 5 My pain is sharp, burning, dull, stabbing, tingling and aching  In the last 24 hours, has pain interfered with the following? General activity 5 Relation with others 0 Enjoyment of life 2 What TIME of day is your pain at its worst? morning  and night Sleep (in general) Fair  Pain is worse with: walking, bending, inactivity, standing and some activites Pain improves with: rest and medication Relief from Meds: 5  Family History  Problem Relation Age of Onset  . Heart attack Mother   . Colon cancer Mother   . Heart attack Father   . Stroke Sister   . Hemophilia Child   . Cancer Other   . Coronary artery disease Other   . Cancer Brother   . Cancer Maternal Aunt   . Cancer Maternal Uncle   . Cancer Paternal Aunt   . Cancer Paternal Uncle   . Cancer Brother    Social History   Socioeconomic History  . Marital status: Divorced    Spouse name: Not on file  . Number of children: 4  . Years of education: Not on file  . Highest education level: Not on file  Occupational History  . Occupation: Disabled  Tobacco Use  . Smoking status: Never Smoker  . Smokeless tobacco: Never Used  Vaping Use  . Vaping Use: Never used  Substance and Sexual Activity  . Alcohol use: No    Alcohol/week: 0.0  standard drinks  . Drug use: No  . Sexual activity: Not on file  Other Topics Concern  . Not on file  Social History Narrative   Patient does not get regular exercise   Denies caffeine use    Social Determinants of Health   Financial Resource Strain: Not on file  Food Insecurity: Not on file  Transportation Needs: Not on file  Physical Activity: Not on file  Stress: Not on file  Social Connections: Not on file   Past Surgical History:  Procedure Laterality Date  . ABDOMINAL HYSTERECTOMY    . Arm Surgery Bilateral    due to fall-pt broke both forearms  . BIOPSY N/A 07/01/2013   Procedure: BIOPSY;  Surgeon: Rogene Houston, MD;  [facility]: [facility];  Service: Endoscopy;  Laterality: N/A;  . CHOLECYSTECTOMY    . COLONOSCOPY  08/06/2011   Procedure: COLONOSCOPY;  Surgeon: Rogene Houston, MD;  [facility];  Service: Endoscopy;  Laterality: N/A;  215  . COLONOSCOPY WITH PROPOFOL N/A 12/31/2012   Procedure: COLONOSCOPY WITH PROPOFOL;  Surgeon: Rogene Houston, MD;  Location: [facility];  Service: Endoscopy;  Laterality: N/A;  in cecum at 0814, total withdrawal time 60mn  . COLONOSCOPY WITH PROPOFOL N/A 02/05/2018   Procedure: COLONOSCOPY WITH PROPOFOL;  Surgeon: RRogene Houston  MD;  [facility];  Service: Endoscopy;  Laterality: N/A;  1:25  . CORONARY ANGIOPLASTY WITH STENT PLACEMENT    . CYSTOSCOPY W/ URETERAL STENT PLACEMENT Right 11/10/2019   at [facility]  . CYSTOSCOPY WITH RETROGRADE PYELOGRAM, URETEROSCOPY AND STENT PLACEMENT Right 12/22/2019   Procedure: CYSTOSCOPY WITH RIGHT URETERAL STENT REMOVAL; RIGHT RETROGRADE PYELOGRAM,RIGHT URETEROSCOPY;  Surgeon: Cleon Gustin, MD;  [facility]: [facility];  Service: Urology;  Laterality: Right;  . ESOPHAGEAL DILATION N/A 06/07/2015   Procedure: ESOPHAGEAL DILATION;  Surgeon: Rogene Houston, MD;  [facility];  Service: Endoscopy;  Laterality: N/A;  . ESOPHAGOGASTRODUODENOSCOPY N/A 06/07/2015   Procedure:  ESOPHAGOGASTRODUODENOSCOPY (EGD);  Surgeon: Rogene Houston, MD;  [facility];  Service: Endoscopy;  Laterality: N/A;  2:45 - moved to 1:00 - Ann to notify pt  . ESOPHAGOGASTRODUODENOSCOPY (EGD) WITH PROPOFOL N/A 12/31/2012   Procedure: ESOPHAGOGASTRODUODENOSCOPY (EGD) WITH PROPOFOL;  Surgeon: Rogene Houston, MD;  [facility];  Service: Endoscopy;  Laterality: N/A;  GE junction 37,  . ESOPHAGOGASTRODUODENOSCOPY (EGD) WITH PROPOFOL N/A 07/01/2013   Procedure: ESOPHAGOGASTRODUODENOSCOPY (EGD) WITH PROPOFOL;  Surgeon: Rogene Houston, MD;  [facility];  Service: Endoscopy;  Laterality: N/A;  950  . MALONEY DILATION N/A 12/31/2012   Procedure: MALONEY DILATION;  Surgeon: Rogene Houston, MD;  [facility]: [facility];  Service: Endoscopy;  Laterality: N/A;  used a # 54,#56  . MALONEY DILATION N/A 07/01/2013   Procedure: Venia Minks DILATION;  Surgeon: Rogene Houston, MD;  [facility];  Service: Endoscopy;  Laterality: N/A;  54/58; no heme present  . POLYPECTOMY  02/05/2018   Procedure: POLYPECTOMY;  Surgeon: Rogene Houston, MD;  [facility];  Service: Endoscopy;;  distal rectum (HS)  . STONE EXTRACTION WITH BASKET Right 12/22/2019   Procedure: STONE EXTRACTION WITH BASKET;  Surgeon: Cleon Gustin, MD;  [facility];  Service: Urology;  Laterality: Right;   Past Surgical History:  Procedure Laterality Date  . ABDOMINAL HYSTERECTOMY    . Arm Surgery Bilateral    due to fall-pt broke both forearms  . BIOPSY N/A 07/01/2013   Procedure: BIOPSY;  Surgeon: Rogene Houston, MD;  [facility];  Service: Endoscopy;  Laterality: N/A;  . CHOLECYSTECTOMY    . COLONOSCOPY  08/06/2011   Procedure: COLONOSCOPY;  Surgeon: Rogene Houston, MD;  [facility];  Service: Endoscopy;  Laterality: N/A;  215  . COLONOSCOPY WITH PROPOFOL N/A 12/31/2012   Procedure: COLONOSCOPY WITH PROPOFOL;  Surgeon: Rogene Houston, MD;  [facility];  Service: Endoscopy;   Laterality: N/A;  in cecum at 0814, total withdrawal time 25mn  . COLONOSCOPY WITH PROPOFOL N/A 02/05/2018   Procedure: COLONOSCOPY WITH PROPOFOL;  Surgeon: RRogene Houston MD;  [facility];  Service: Endoscopy;  Laterality: N/A;  1:25  . CORONARY ANGIOPLASTY WITH STENT PLACEMENT    . CYSTOSCOPY W/ URETERAL STENT PLACEMENT Right 11/10/2019   [facility] CYSTOSCOPY WITH RETROGRADE PYELOGRAM, URETEROSCOPY AND STENT PLACEMENT Right 12/22/2019   Procedure: CYSTOSCOPY WITH RIGHT URETERAL STENT REMOVAL; RIGHT RETROGRADE PYELOGRAM,RIGHT URETEROSCOPY;  Surgeon: MCleon Gustin MD;  [facility];  Service: Urology;  Laterality: Right;  . ESOPHAGEAL DILATION N/A 06/07/2015   Procedure: ESOPHAGEAL DILATION;  Surgeon: NRogene Houston MD;  [facility];  Service: Endoscopy;  Laterality: N/A;  . ESOPHAGOGASTRODUODENOSCOPY N/A 06/07/2015   Procedure: ESOPHAGOGASTRODUODENOSCOPY (EGD);  Surgeon: NRogene Houston MD;  [facility];  Service: Endoscopy;  Laterality: N/A;  2:45 - moved to 1:00 - Ann to notify pt  . ESOPHAGOGASTRODUODENOSCOPY (EGD) WITH PROPOFOL N/A 12/31/2012   Procedure: ESOPHAGOGASTRODUODENOSCOPY (EGD) WITH PROPOFOL;  Surgeon: Rogene Houston, MD;  [facility];  Service: Endoscopy;  Laterality: N/A;  GE junction 37,  . ESOPHAGOGASTRODUODENOSCOPY (EGD) WITH PROPOFOL N/A 07/01/2013   Procedure: ESOPHAGOGASTRODUODENOSCOPY (EGD) WITH PROPOFOL;  Surgeon: Rogene Houston, MD;  [facility];  Service: Endoscopy;  Laterality: N/A;  950  . MALONEY DILATION N/A 12/31/2012   Procedure: MALONEY DILATION;  Surgeon: Rogene Houston, MD;  [facility];  Service: Endoscopy;  Laterality: N/A;  used a # 54,#56  . MALONEY DILATION N/A 07/01/2013   Procedure: Venia Minks DILATION;  Surgeon: Rogene Houston, MD;  [facility];  Service: Endoscopy;  Laterality: N/A;  54/58; no heme present  . POLYPECTOMY  02/05/2018   Procedure: POLYPECTOMY;  Surgeon: Rogene Houston, MD;  [facility];  Service: Endoscopy;;  distal rectum (HS)  . STONE EXTRACTION WITH BASKET Right 12/22/2019   Procedure: STONE EXTRACTION WITH BASKET;  Surgeon: Cleon Gustin, MD;  [facility]: [facility];  Service: Urology;  Laterality: Right;   Past Medical History:  Diagnosis Date  . Anxiety and depression   . Arthritis   . CAD (coronary artery disease)    Stent placement circumflex coronary 2007, catheterization 2008 patent stents. Normal LV function  . Chest pain   . CHF (congestive heart failure) ([place])   . COPD (chronic obstructive pulmonary disease) (H[facility])    no home O2  . Depression   . Diabetes mellitus    Insulin dependent  . Diabetic polyneuropathy (H[facility])     severe on multiple medications  . Dyslipidemia   . GERD (gastroesophageal reflux disease)   . Headache(784.0)   . Heart attack ([place])   . Heartburn   . 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 High cholesterol   . Hx of blood clots   . Hypertension   . MI (myocardial infarction) ([place])    2007  . Obstructive sleep apnea    CPAP, setting ?2  . Palpitations   . Sleep apnea   . Tachycardia    BP 130/75   Pulse 73   Temp 98.6 F (37 C)   Ht 5' 4"  (1.626 m)   Wt 239 lb 6.4 oz (108.6 kg)   SpO2 93%   BMI 41.09 kg/m   Opioid Risk Score:   Fall Risk Score:  `1  Depression screen PHQ 2/9  Depression screen [facility] 2/9 01/24/2020 11/18/2019 10/26/2019 09/26/2019 03/24/2019 07/16/2018 06/12/2017  Decreased Interest 0 1 0 0 1 1 3   Down, Depressed, Hopeless 0 1 0 0 1 1 3   PHQ - 2 Score 0 2 0 0 2 2 6   Altered sleeping - - - - - - 3  Tired, decreased energy - - - - - - 3  Change in appetite - - - - - - 0  Feeling bad or failure about yourself  - - - - - - 3  Trouble concentrating - - - - - - 3  Moving slowly or fidgety/restless - - - - - - 1  Suicidal thoughts - - - - - - 0  PHQ-9 Score - - - - - - 19  Difficult doing work/chores - - - - - - Very difficult  Some recent data might be hidden    Review of  Systems     Objective:   Physical Exam Vitals and nursing note reviewed.  Constitutional:      Appearance: Normal appearance.  Neck:     Comments: Cervical Paraspinal Tenderness: C-5-C-6 Cardiovascular:     Rate and Rhythm: Normal rate and regular rhythm.     Pulses: Normal pulses.     Heart sounds: Normal heart sounds.  Pulmonary:     Effort: Pulmonary effort is normal.     Breath sounds: Normal breath sounds.  Musculoskeletal:     Cervical back: Normal range of motion and neck supple.     Right lower leg: Edema present.     Left lower leg: Edema present.     Comments: Normal Muscle Bulk and Muscle Testing Reveals: Upper Extremities: Full ROM and Muscle Strength 5/5 Bilateral AC Joint Tenderness:   Thoracic and Lumbar Hypersensitivity Bilateral Greater Trochanter Tenderness Lower Extremities: Full ROM and Muscle Strength 5/5 Bilateral Lower Extremities Flexion Produces pain into her Bilateral Patella's Arises from Table Slowly Narrow Based Gait   Skin:    General: Skin is warm and dry.  Neurological:     Mental Status: She is alert and oriented to person, place, and time.  Psychiatric:        Mood and Affect: Mood normal.        Behavior: Behavior normal.           Assessment & Plan:  1.Lumbar pain lumbar spondylosis: Encouraged to continue toincrease Activity as tolerated.02/23/2020. Continue current medication regimen. Continue:MSIR 15 mg one tablet daily as needed for breakthrough painandRefilled:MS Contin 30 MG one tablet every12hours #60. Boyfriend Joe is dispensing Ms. Lampley Medication she reports. We will continue the opioid monitoring program, this consists of regular clinic visits, examinations, urine drug screen, pill counts as well as use of [place] Controlled Substance Reporting system. A 12 month History has been reviewed on the [place] Controlled Substance Reporting Systemon12/11/2019. 2. Lumbar Radiculitis: Continuecurrent  medication regimen withGabapentin.02/23/2020 3. Bilateral Knee Pain/ Degenerative:Continue Voltaren Gel. Continue to Monitor.02/23/2020 4. Severe diabetic poly neuropathy: Continuecurrent medication regimen withGabapentin.02/23/2020 5. Insomnia: ContinueArmodafinilPsychiatry Following.02/23/2020 6.Anxiety/Depression:Continue Current Medication regimen as prescribed byPsychiatry.02/23/2020 7. De Quervains Tenosynovitis: No Complaints Today.02/23/2020 8.BilateralGreater Trochanteric Bursitis:Continue with Ice and Heat Therapy. Continue to Monitor.02/23/2020. 9. Cervicalgia/ Cervical Radiculitis:Continue Gabapentin.Continue with HEP as Tolerated and continue to monitor.02/23/2020 10. Chronic Bilateral Thoracic Back Pain: Continue HEP as Tolerated. Continue current medication regimen. Continue to monitor.02/23/2020.  F/U in 1 month

## 2020-02-24 ENCOUNTER — Encounter: Payer: Self-pay | Admitting: Registered Nurse

## 2020-02-29 LAB — HEMOGLOBIN A1C: Hemoglobin A1C: 9.5

## 2020-03-01 ENCOUNTER — Telehealth: Payer: Self-pay | Admitting: "Endocrinology

## 2020-03-01 NOTE — Telephone Encounter (Signed)
Dr. Woody Seller office put in another referral for this patient. This patient has been seen in 2020 so a new referral is not needed. This patient A1C is 9.5. tried to call patient to get her back on the sch. No answer

## 2020-03-19 ENCOUNTER — Ambulatory Visit: Payer: Medicare Other | Admitting: "Endocrinology

## 2020-03-24 IMAGING — NM NM GASTRIC EMPTYING
4 series · 4 of 4 positions shown · non-contrast
Comparison: None.

CLINICAL DATA: Nausea and vomiting

EXAM:
NUCLEAR MEDICINE GASTRIC EMPTYING SCAN
TECHNIQUE: After oral ingestion of radiolabeled meal, sequential abdominal
images were obtained for 60 minutes. Residual percentage of activity
remaining within the stomach was calculated at 60 minutes.
RADIOPHARMACEUTICALS:  2.2 mCi Lc-QQm sulfur colloid in standardized
meal in egg

[Series 1: 0 min · 4.14mm/px · 1 of 1 slices shown (1 of 2)]
[im 1/1]
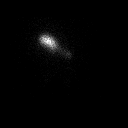

[Series 1: 0 min · 4.14mm/px · 1 of 1 slices shown (2 of 2)]
[im 1/1]
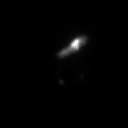

[Series 2: 60 min · 4.14mm/px · 1 of 1 slices shown (1 of 2)]
[im 1/1]
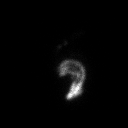

[Series 2: 60 min · 4.14mm/px · 1 of 1 slices shown (2 of 2)]
[im 1/1  full-range]
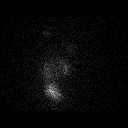

[4 of 4 positions shown; findings below may reference images not displayed]

FINDINGS: Expected location of the stomach in the left upper quadrant.
Ingested meal empties the stomach gradually over the course of the
study with 5.6% retention at 60 min (normal retention less than 30%
at a 120 min).
IMPRESSION: Normal gastric emptying study. There is somewhat unusually rapid
emptying of solid material from the stomach. The clinical
significance of this finding is uncertain.

## 2020-03-26 ENCOUNTER — Encounter: Payer: Self-pay | Admitting: "Endocrinology

## 2020-03-26 ENCOUNTER — Other Ambulatory Visit: Payer: Self-pay

## 2020-03-26 ENCOUNTER — Ambulatory Visit (INDEPENDENT_AMBULATORY_CARE_PROVIDER_SITE_OTHER): Payer: Medicare Other | Admitting: "Endocrinology

## 2020-03-26 VITALS — BP 140/68 | HR 80 | Ht 64.0 in | Wt 246.2 lb

## 2020-03-26 DIAGNOSIS — E559 Vitamin D deficiency, unspecified: Secondary | ICD-10-CM

## 2020-03-26 DIAGNOSIS — I10 Essential (primary) hypertension: Secondary | ICD-10-CM

## 2020-03-26 DIAGNOSIS — E1159 Type 2 diabetes mellitus with other circulatory complications: Secondary | ICD-10-CM | POA: Diagnosis not present

## 2020-03-26 DIAGNOSIS — E782 Mixed hyperlipidemia: Secondary | ICD-10-CM | POA: Diagnosis not present

## 2020-03-26 MED ORDER — TRESIBA FLEXTOUCH 100 UNIT/ML ~~LOC~~ SOPN: 70.0000 [IU] | PEN_INJECTOR | Freq: Every day | SUBCUTANEOUS | 1 refills | Status: DC

## 2020-03-26 NOTE — Progress Notes (Signed)
03/26/2020   Endocrinology follow-up note    Subjective:    Patient ID: [name], female    DOB: 03-21-1950. Patient is being seen in follow-up after she was seen in consultation for uncontrolled diabetes.  She missed her appointment since September 2020.  She has been on regular follow-up for  management of currently uncontrolled, complicated type 2 diabetes; hyperlipidemia; hypertension.  Past Medical History:  Diagnosis Date  . Anxiety and depression   . Arthritis   . CAD (coronary artery disease)    Stent placement circumflex coronary 2007, catheterization 2008 patent stents. Normal LV function  . Chest pain   . CHF (congestive heart failure) ([place])   . COPD (chronic obstructive pulmonary disease) (HCC)    no home O2  . Depression   . Diabetes mellitus    Insulin dependent  . Diabetic polyneuropathy (HCC)     severe on multiple medications  . Dyslipidemia   . GERD (gastroesophageal reflux disease)   . Headache(784.0)   . Heart attack ([place])   . Heartburn   . 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 High cholesterol   . Hx of blood clots   . Hypertension   . MI (myocardial infarction) ([place])    2007  . Obstructive sleep apnea    CPAP, setting ?2  . Palpitations   . Sleep apnea   . Tachycardia    Past Surgical History:  Procedure Laterality Date  . ABDOMINAL HYSTERECTOMY    . Arm Surgery Bilateral    due to fall-pt broke both forearms  . BIOPSY N/A 07/01/2013   Procedure: BIOPSY;  Surgeon: Rogene Houston, MD;  Location: [facility];  Service: Endoscopy;  Laterality: N/A;  . CHOLECYSTECTOMY    . COLONOSCOPY  08/06/2011   Procedure: COLONOSCOPY;  Surgeon: Rogene Houston, MD;  [facility];  Service: Endoscopy;  Laterality: N/A;  215  . COLONOSCOPY WITH PROPOFOL N/A 12/31/2012   Procedure: COLONOSCOPY WITH PROPOFOL;  Surgeon: Rogene Houston, MD;  Location: [facility];  Service: Endoscopy;  Laterality: N/A;  in cecum at 0814, total withdrawal time 42mn  . COLONOSCOPY  WITH PROPOFOL N/A 02/05/2018   Procedure: COLONOSCOPY WITH PROPOFOL;  Surgeon: RRogene Houston MD;  [facility];  Service: Endoscopy;  Laterality: N/A;  1:25  . CORONARY ANGIOPLASTY WITH STENT PLACEMENT    . CYSTOSCOPY W/ URETERAL STENT PLACEMENT Right [facility]/10/2019   [facility] CYSTOSCOPY WITH RETROGRADE PYELOGRAM, URETEROSCOPY AND STENT PLACEMENT Right 12/22/2019   Procedure: CYSTOSCOPY WITH RIGHT URETERAL STENT REMOVAL; RIGHT RETROGRADE PYELOGRAM,RIGHT URETEROSCOPY;  Surgeon: MCleon Gustin MD;  Location: [facility];  Service: Urology;  Laterality: Right;  . ESOPHAGEAL DILATION N/A 06/07/2015   Procedure: ESOPHAGEAL DILATION;  Surgeon: NRogene Houston MD;  [facility];  Service: Endoscopy;  Laterality: N/A;  . ESOPHAGOGASTRODUODENOSCOPY N/A 06/07/2015   Procedure: ESOPHAGOGASTRODUODENOSCOPY (EGD);  Surgeon: NRogene Houston MD;  [facility];  Service: Endoscopy;  Laterality: N/A;  2:45 - moved to 1:00 - Ann to notify pt  . ESOPHAGOGASTRODUODENOSCOPY (EGD) WITH PROPOFOL N/A 12/31/2012   Procedure: ESOPHAGOGASTRODUODENOSCOPY (EGD) WITH PROPOFOL;  Surgeon: NRogene Houston MD;  Location: [facility];  Service: Endoscopy;  Laterality: N/A;  GE junction 37,  . ESOPHAGOGASTRODUODENOSCOPY (EGD) WITH PROPOFOL N/A 07/01/2013   Procedure: ESOPHAGOGASTRODUODENOSCOPY (EGD) WITH PROPOFOL;  Surgeon: NRogene Houston MD;  Location: [facility];  Service: Endoscopy;  Laterality: N/A;  950  . MALONEY DILATION N/A 12/31/2012  Procedure: MALONEY DILATION;  Surgeon: Rogene Houston, MD;  Location: [facility];  Service: Endoscopy;  Laterality: N/A;  used a # 54,#56  . MALONEY DILATION N/A 07/01/2013   Procedure: Venia Minks DILATION;  Surgeon: Rogene Houston, MD;  Location: [facility];  Service: Endoscopy;  Laterality: N/A;  54/58; no heme present  . POLYPECTOMY  02/05/2018   Procedure: POLYPECTOMY;  Surgeon: Rogene Houston, MD;  [facility];  Service: Endoscopy;;  distal rectum (HS)  .  STONE EXTRACTION WITH BASKET Right 12/22/2019   Procedure: STONE EXTRACTION WITH BASKET;  Surgeon: Cleon Gustin, MD;  Location: [facility];  Service: Urology;  Laterality: Right;   Social History   Socioeconomic History  . Marital status: Divorced    Spouse name: Not on file  . Number of children: 4  . Years of education: Not on file  . Highest education level: Not on file  Occupational History  . Occupation: Disabled  Tobacco Use  . Smoking status: Never Smoker  . Smokeless tobacco: Never Used  Vaping Use  . Vaping Use: Never used  Substance and Sexual Activity  . Alcohol use: No    Alcohol/week: 0.0 standard drinks  . Drug use: No  . Sexual activity: Not on file  Other Topics Concern  . Not on file  Social History Narrative   Patient does not get regular exercise   Denies caffeine use    Social Determinants of Health   Financial Resource Strain: Not on file  Food Insecurity: Not on file  Transportation Needs: Not on file  Physical Activity: Not on file  Stress: Not on file  Social Connections: Not on file   Outpatient Encounter Medications as of 03/26/2020  Medication Sig  . ALPRAZolam (XANAX) 0.5 MG tablet Take 0.5 mg by mouth 2 (two) times daily as needed.  . Armodafinil 150 MG tablet Take 150 mg by mouth daily.  Marland Kitchen aspirin EC 81 MG tablet Take 1 tablet (81 mg total) by mouth daily.  Marland Kitchen atorvastatin (LIPITOR) 40 MG tablet Take 40 mg by mouth daily.  . B-D ULTRAFINE III SHORT PEN 31G X 8 MM MISC Inject into the skin 4 (four) times daily.  . Cholecalciferol (VITAMIN D3) 125 MCG (5000 UT) CAPS TAKE ONE CAPSULE BY MOUTH DAILY (Patient taking differently: Take 5,000 Units by mouth daily.)  . clotrimazole-betamethasone (LOTRISONE) cream Apply to affected area 2 times daily for 21 days  . diclofenac Sodium (VOLTAREN) 1 % GEL APPLY TO BOTH KNEES THREE TIMES DAILY  . diltiazem (CARDIZEM CD) 240 MG 24 hr capsule Take 240 mg by mouth daily.  Marland Kitchen doxepin (SINEQUAN) 25 MG capsule  Take 75 mg by mouth at bedtime.   Marland Kitchen ELIQUIS 5 MG TABS tablet Take 5 mg by mouth in the morning and at bedtime.  Marland Kitchen estradiol (ESTRACE) 0.5 MG tablet Take 0.5 mg by mouth daily.   . FEROSUL 325 (65 Fe) MG tablet Take 325 mg by mouth every morning.  . furosemide (LASIX) 20 MG tablet Take 20 mg by mouth daily.   Marland Kitchen gabapentin (NEURONTIN) 400 MG capsule Take 400 mg by mouth 3 (three) times daily.  Marland Kitchen glipiZIDE (GLUCOTROL XL) 5 MG 24 hr tablet TAKE 1 TABLET BY MOUTH EVERY DAY WITH BREAKFAST (Patient taking differently: Take 5 mg by mouth daily with breakfast.)  . lisinopril (ZESTRIL) 5 MG tablet Take 1 tablet (5 mg total) by mouth daily.  . meloxicam (MOBIC) 7.5 MG tablet Take 7.5 mg by mouth  in the morning and at bedtime.   . metFORMIN (GLUCOPHAGE) 500 MG tablet TAKE 1 TABLET BY MOUTH TWICE DAILY (Patient taking differently: Take 500 mg by mouth 2 (two) times daily with a meal.)  . metoCLOPramide (REGLAN) 5 MG tablet Take 5 mg by mouth 3 (three) times daily as needed for nausea or vomiting.   . metoprolol succinate (TOPROL-XL) 100 MG 24 hr tablet Take 100 mg by mouth daily. Take with or immediately following a meal.  . montelukast (SINGULAIR) 10 MG tablet Take 10 mg by mouth at bedtime.   Marland Kitchen morphine (MS CONTIN) 30 MG 12 hr tablet Take 1 tablet (30 mg total) by mouth every 12 (twelve) hours.  Marland Kitchen morphine (MSIR) 15 MG tablet Take 1 tablet (15 mg total) by mouth daily as needed for severe pain. Do Not Fill Before 11/25/2019  . nitroGLYCERIN (NITROSTAT) 0.4 MG SL tablet Place 1 tablet (0.4 mg total) under the tongue every 5 (five) minutes x 3 doses as needed. For chest pains  . NOVOLOG FLEXPEN 100 UNIT/ML FlexPen Inject into the skin.  Marland Kitchen ondansetron (ZOFRAN) 4 MG tablet Take 4 mg by mouth every 6 (six) hours as needed for nausea or vomiting.  . pantoprazole (PROTONIX) 40 MG tablet TAKE 1 TABLET BY MOUTH TWICE DAILY BEFORE MEALS (Patient taking differently: Take 40 mg by mouth 2 (two) times daily before a  meal.)  . polyethylene glycol powder (GLYCOLAX/MIRALAX) 17 GM/SCOOP powder Take 1 Container by mouth daily as needed for moderate constipation.   . potassium chloride (K-DUR,KLOR-CON) 10 MEQ tablet Take 10 mEq by mouth 2 (two) times daily.  Marland Kitchen PROAIR HFA 108 (90 Base) MCG/ACT inhaler Inhale 2 puffs into the lungs every 4 (four) hours as needed for wheezing or shortness of breath.   . promethazine (PHENERGAN) 25 MG tablet Take 1 tablet (25 mg total) by mouth 4 (four) times daily as needed.  . sucralfate (CARAFATE) 1 g tablet Take 1 g by mouth 4 (four) times daily.  Marland Kitchen topiramate (TOPAMAX) 200 MG tablet Take 200 mg by mouth at bedtime.  Tyler Aas FLEXTOUCH 100 UNIT/ML FlexTouch Pen Inject 70 Units into the skin at bedtime.  . [DISCONTINUED] albuterol (VENTOLIN HFA) 108 (90 Base) MCG/ACT inhaler Inhale into the lungs.  . [DISCONTINUED] buPROPion (WELLBUTRIN SR) 100 MG 12 hr tablet Take 100 mg by mouth daily.  . [DISCONTINUED] TRESIBA FLEXTOUCH 100 UNIT/ML SOPN FlexTouch Pen INJECT 60 UNITS INTO THE SKIN AT BEDTIME (Patient taking differently: Inject 70 Units into the skin at bedtime.)   No facility-administered encounter medications on file as of 03/26/2020.   ALLERGIES: Allergies  Allergen Reactions  . Amphetamine-Dextroamphetamine Swelling  . Nitrofuran Derivatives Itching and Swelling  . Amphetamine-Dextroamphet Er Swelling  . Pregabalin Swelling  . Topiramate Other (See Comments)    Tongue tingle  . Verelan [Verapamil] Rash   VACCINATION STATUS:  There is no immunization history on file for this patient.  Diabetes She presents for her follow-up diabetic visit. She has type 2 diabetes mellitus. Onset time: She was diagnosed at approximate age of 25 years. Her disease course has been worsening. There are no hypoglycemic associated symptoms. Pertinent negatives for hypoglycemia include no confusion, headaches, pallor or seizures. Pertinent negatives for diabetes include no chest pain, no  fatigue, no polydipsia, no polyphagia and no polyuria. There are no hypoglycemic complications. Symptoms are worsening. Diabetic complications include autonomic neuropathy, heart disease, peripheral neuropathy, PVD and retinopathy. Risk factors for coronary artery disease include dyslipidemia, diabetes mellitus, obesity,  hypertension and sedentary lifestyle. Current diabetic treatments: Lantus 65 units daily at bedtime, Humalog 15 units 3 times a day before meals. She mentions allergy to Victoza, By[place], and metformin. She is following a generally unhealthy diet. When asked about meal planning, she reported none. She has not had a previous visit with a dietitian. She never participates in exercise. Her home blood glucose trend is decreasing steadily. (She missed her appointment since September 2020.  She presents with no logs nor meter.  Her recent labs show A1c of 9.5% consistent with uncontrolled diabetes.  She denies hypoglycemia.  She reports her blood glucose ranges from 119-170 fasting.) An ACE inhibitor/angiotensin II receptor blocker is not being taken. Eye exam is current.  Hyperlipidemia This is a chronic problem. The current episode started more than 1 year ago. The problem is uncontrolled. Recent lipid tests were reviewed and are high. Exacerbating diseases include diabetes and obesity. Pertinent negatives include no chest pain, myalgias or shortness of breath. Current antihyperlipidemic treatment includes statins. Risk factors for coronary artery disease include diabetes mellitus, dyslipidemia, hypertension, obesity and a sedentary lifestyle.  Hypertension This is a chronic problem. The current episode started more than 1 year ago. Pertinent negatives include no chest pain, headaches, palpitations or shortness of breath. Risk factors for coronary artery disease include diabetes mellitus, dyslipidemia, obesity and sedentary lifestyle. Hypertensive end-organ damage includes CAD/MI, PVD and  retinopathy.    Review of systems: Limited as above.  Objective:    BP 140/68   Pulse 80   Ht 5' 4"  (1.626 m)   Wt 246 lb 3.2 oz (111.7 kg)   BMI 42.26 kg/m   Wt Readings from Last 3 Encounters:  03/26/20 246 lb 3.2 oz (111.7 kg)  02/23/20 239 lb 6.4 oz (108.6 kg)  01/24/20 234 lb 6.4 oz (106.3 kg)     CMP     Component Value Date/Time   NA 140 10/27/2019 1130   NA 143 11/06/2016 1112   K 4.3 10/27/2019 1130   CL 107 10/27/2019 1130   CO2 20 (L) 10/27/2019 1130   GLUCOSE 213 (H) 10/27/2019 1130   BUN 16 10/27/2019 1130   BUN 11 11/06/2016 1112   CREATININE 0.66 10/27/2019 1130   CREATININE 0.70 11/02/2018 1105   CALCIUM 8.9 10/27/2019 1130   PROT 6.1 11/02/2018 1105   PROT 5.5 (L) 11/06/2016 1112   ALBUMIN 2.9 (L) 12/07/2017 1413   ALBUMIN 3.4 (L) 11/06/2016 1112   AST 13 11/02/2018 1105   ALT 13 11/02/2018 1105   ALKPHOS 65 12/07/2017 1413   BILITOT 0.7 11/02/2018 1105   BILITOT 0.3 11/06/2016 1112   GFRNONAA >60 10/27/2019 1130   GFRNONAA 91 11/02/2018 1105   GFRAA >60 10/27/2019 1130   GFRAA 105 11/02/2018 1105   Diabetic Labs (most recent): Lab Results  Component Value Date   HGBA1C 9.5 02/29/2020   HGBA1C 9.7 (H) 10/27/2019   HGBA1C 10.1 (H) 11/02/2018    Lipid Panel     Component Value Date/Time   CHOL 173 07/06/2018 0936   CHOL 184 04/15/2016 1121   TRIG 109 07/06/2018 0936   HDL 62 07/06/2018 0936   HDL 77 04/15/2016 1121   CHOLHDL 2.8 07/06/2018 0936   VLDL UNABLE TO CALCULATE IF TRIGLYCERIDE OVER 400 mg/dL 02/18/2017 0528   LDLCALC 90 07/06/2018 0936     Assessment & Plan:   1. Type 2 diabetes mellitus with vascular disease ([place]) - Patient has currently uncontrolled symptomatic type 2 DM since  70  years of age.  She missed her appointment since September 2020.  She presents with no logs nor meter.  Her recent labs show A1c of 9.5% consistent with uncontrolled diabetes.  She denies hypoglycemia.  She reports her blood glucose ranges  from 119-170 fasting.  Recent labs reviewed.  - Her diabetes is complicated by obesity/sedentary life, coronary artery disease status post stent placement and patient remains at a high risk for more acute and chronic complications of diabetes which include CAD, CVA, CKD, retinopathy, and neuropathy. These are all discussed in detail with the patient.  - I have counseled the patient on diet management and weight loss, by adopting a carbohydrate restricted/protein rich diet.  - she acknowledges that there is a room for improvement in her food and drink choices. - Suggestion is made for her to avoid simple carbohydrates  from her diet including Cakes, Sweet Desserts, Ice Cream, Soda (diet and regular), Sweet Tea, Candies, Chips, Cookies, Store Bought Juices, Alcohol in Excess of  1-2 drinks a day, Artificial Sweeteners,  Coffee Creamer, and "Sugar-free" Products, Lemonade. This will help patient to have more stable blood glucose profile and potentially avoid unintended weight gain.  - Patient is advised to stick to a routine mealtimes to eat 3 meals  a day and avoid unnecessary snacks ( to snack only to correct hypoglycemia).   - I have approached patient with the following individualized plan to manage diabetes and patient agrees:   - She continues to have normal renal function, would benefit from continued treatment with Metformin.  However, she may need addition of prandial insulin to control her diabetes to target.    -Accordingly, she is approached to start monitoring blood glucose 4 times a day-before meals and at bedtime and return in 10 days for reevaluation.   This patient will benefit from a CGM.  She will be considered for the freestyle libre device next visit.  -In the meantime, she is advised to increase [place] to 70 units nightly, continue Metformin 500 mg p.o. twice daily, continue glipizide 5 mg XL p.o. daily at breakfast.   -Patient is encouraged to call clinic for blood glucose  levels less than 70 or above 200 mg /dl. -She has intolerance to GLP-1 receptor agonists.    2) BP/HTN: Her blood pressure is controlled to target.  She has adequate medications, advised to continue including metoprolol XL 50 mg p.o. daily.  3) Lipids/HPL: She has uncontrolled hypertriglyceridemia , still high at 439  LDL better at 50, HDL at 77. She is advised to continue Lipitor 40 mg p.o. nightly.   Better control of diabetes will help for triglycerides.   4) Chronic Care/Health Maintenance:  -Patient is on Statin medications and encouraged to continue to follow up with Ophthalmology, Podiatrist at least yearly or according to recommendations, and advised to   stay away from smoking. I have recommended yearly flu vaccine and pneumonia vaccination at least every 5 years;  and  sleep for at least 7 hours a day. -She will benefit from a set of diabetic shoes, paperwork filled for her.  - I advised patient to maintain close follow up with Glenda Chroman, MD for primary care needs.  - Time spent on this patient care encounter:  35 min, of which > 50% was spent in  counseling and the rest reviewing her blood glucose logs , discussing her hypoglycemia and hyperglycemia episodes, reviewing her current and  previous labs / studies  ( including abstraction from other  facilities) and medications  doses and developing a  long term treatment plan and documenting her care.   Please refer to Patient Instructions for Blood Glucose Monitoring and Insulin/Medications Dosing Guide"  in media tab for additional information. Please  also refer to " Patient Self Inventory" in the Media  tab for reviewed elements of pertinent patient history.  [name] participated in the discussions, expressed understanding, and voiced agreement with the above plans.  All questions were answered to her satisfaction. she is encouraged to contact clinic should she have any questions or concerns prior to her return  visit.   Follow up plan: - Patient to go to emergency room for better evaluation of chest pain. Return in about 10 days (around 04/05/2020) for F/U with Meter and Logs Only - no Labs.  Glade Lloyd, MD Phone: [phone]  Fax: [phone]  This note was partially dictated with voice recognition software. Similar sounding words can be transcribed inadequately or may not  be corrected upon review.  03/26/2020, 2:52 PM

## 2020-03-26 NOTE — Patient Instructions (Signed)

## 2020-03-27 ENCOUNTER — Other Ambulatory Visit: Payer: Self-pay

## 2020-03-27 ENCOUNTER — Encounter: Payer: Self-pay | Admitting: Registered Nurse

## 2020-03-27 ENCOUNTER — Encounter: Payer: Medicare Other | Attending: Registered Nurse | Admitting: Registered Nurse

## 2020-03-27 VITALS — BP 143/81 | HR 72 | Temp 99.4°F | Ht 64.0 in | Wt 249.0 lb

## 2020-03-27 DIAGNOSIS — M5416 Radiculopathy, lumbar region: Secondary | ICD-10-CM | POA: Insufficient documentation

## 2020-03-27 DIAGNOSIS — M7061 Trochanteric bursitis, right hip: Secondary | ICD-10-CM | POA: Diagnosis present

## 2020-03-27 DIAGNOSIS — M5412 Radiculopathy, cervical region: Secondary | ICD-10-CM | POA: Insufficient documentation

## 2020-03-27 DIAGNOSIS — M1712 Unilateral primary osteoarthritis, left knee: Secondary | ICD-10-CM | POA: Insufficient documentation

## 2020-03-27 DIAGNOSIS — M1711 Unilateral primary osteoarthritis, right knee: Secondary | ICD-10-CM | POA: Insufficient documentation

## 2020-03-27 DIAGNOSIS — Z5181 Encounter for therapeutic drug level monitoring: Secondary | ICD-10-CM | POA: Insufficient documentation

## 2020-03-27 DIAGNOSIS — G894 Chronic pain syndrome: Secondary | ICD-10-CM | POA: Insufficient documentation

## 2020-03-27 DIAGNOSIS — Z79891 Long term (current) use of opiate analgesic: Secondary | ICD-10-CM | POA: Insufficient documentation

## 2020-03-27 DIAGNOSIS — M7062 Trochanteric bursitis, left hip: Secondary | ICD-10-CM | POA: Diagnosis present

## 2020-03-27 DIAGNOSIS — M546 Pain in thoracic spine: Secondary | ICD-10-CM | POA: Diagnosis present

## 2020-03-27 DIAGNOSIS — M542 Cervicalgia: Secondary | ICD-10-CM | POA: Diagnosis present

## 2020-03-27 DIAGNOSIS — M48062 Spinal stenosis, lumbar region with neurogenic claudication: Secondary | ICD-10-CM | POA: Diagnosis present

## 2020-03-27 MED ORDER — MORPHINE SULFATE ER 30 MG PO TBCR: 30.0000 mg | EXTENDED_RELEASE_TABLET | Freq: Two times a day (BID) | ORAL | 0 refills | Status: DC

## 2020-03-27 NOTE — Progress Notes (Signed)
Subjective:    Patient ID: [name], female    DOB: 1950-10-11, 70 y.o.   MRN: [id number]  HPI: [name] is a 70 y.o. female who returns for follow up appointment for chronic pain and medication refill. She states her pain is located in her neck radiating into her bilateral shoulder, upper- lower back pain radiating into her bilateral hips and bilateral lower extremities. Also reports bilateral knee pain and bilateral feet pain with tingling and numbness. She rates her pain 7. Her  current exercise regime is walking and performing stretching exercises.  [name] Morphine equivalent is 60.00 MME.    Last Oral Swab was Performed on 01/24/2020, it was consistent.   Pain Inventory Average Pain 7 Pain Right Now 7 My pain is sharp, burning, stabbing, tingling and aching  In the last 24 hours, has pain interfered with the following? General activity 5 Relation with others 7 Enjoyment of life 8 What TIME of day is your pain at its worst? morning  and night Sleep (in general) Fair  Pain is worse with: walking, bending, sitting, inactivity, standing and some activites Pain improves with: medication Relief from Meds: 3  Family History  Problem Relation Age of Onset   Heart attack Mother    Colon cancer Mother    Heart attack Father    Stroke Sister    Hemophilia Child    Cancer Other    Coronary artery disease Other    Cancer Brother    Cancer Maternal Aunt    Cancer Maternal Uncle    Cancer Paternal Aunt    Cancer Paternal Uncle    Cancer Brother    Social History   Socioeconomic History   Marital status: Divorced    Spouse name: Not on file   Number of children: 4   Years of education: Not on file   Highest education level: Not on file  Occupational History   Occupation: Disabled  Tobacco Use   Smoking status: Never Smoker   Smokeless tobacco: Never Used  Scientific laboratory technician Use: Never used  Substance and Sexual Activity    Alcohol use: No    Alcohol/week: 0.0 standard drinks   Drug use: No   Sexual activity: Not on file  Other Topics Concern   Not on file  Social History Narrative   Patient does not get regular exercise   Denies caffeine use    Social Determinants of Health   Financial Resource Strain: Not on file  Food Insecurity: Not on file  Transportation Needs: Not on file  Physical Activity: Not on file  Stress: Not on file  Social Connections: Not on file   Past Surgical History:  Procedure Laterality Date   ABDOMINAL HYSTERECTOMY     Arm Surgery Bilateral    due to fall-pt broke both forearms   BIOPSY N/A 07/01/2013   Procedure: BIOPSY;  Surgeon: Rogene Houston, MD;  [facility]: [facility];  Service: Endoscopy;  Laterality: N/A;   CHOLECYSTECTOMY     COLONOSCOPY  08/06/2011   Procedure: COLONOSCOPY;  Surgeon: Rogene Houston, MD;  [facility];  Service: Endoscopy;  Laterality: N/A;  215   COLONOSCOPY WITH PROPOFOL N/A 12/31/2012   Procedure: COLONOSCOPY WITH PROPOFOL;  Surgeon: Rogene Houston, MD;  Location: [facility];  Service: Endoscopy;  Laterality: N/A;  in cecum at 0814, total withdrawal time 21mn   COLONOSCOPY WITH PROPOFOL N/A 02/05/2018   Procedure: COLONOSCOPY WITH PROPOFOL;  Surgeon:  Rogene Houston, MD;  [facility];  Service: Endoscopy;  Laterality: N/A;  1:25   CORONARY ANGIOPLASTY WITH STENT PLACEMENT     CYSTOSCOPY W/ URETERAL STENT PLACEMENT Right 11/10/2019   at [place], URETEROSCOPY AND STENT PLACEMENT Right 12/22/2019   Procedure: CYSTOSCOPY WITH RIGHT URETERAL STENT REMOVAL; RIGHT RETROGRADE PYELOGRAM,RIGHT URETEROSCOPY;  Surgeon: Cleon Gustin, MD;  [facility]: [facility];  Service: Urology;  Laterality: Right;   ESOPHAGEAL DILATION N/A 06/07/2015   Procedure: ESOPHAGEAL DILATION;  Surgeon: Rogene Houston, MD;  [facility];  Service: Endoscopy;  Laterality: N/A;    ESOPHAGOGASTRODUODENOSCOPY N/A 06/07/2015   Procedure: ESOPHAGOGASTRODUODENOSCOPY (EGD);  Surgeon: Rogene Houston, MD;  [facility];  Service: Endoscopy;  Laterality: N/A;  2:45 - moved to 1:00 - Ann to notify pt   ESOPHAGOGASTRODUODENOSCOPY (EGD) WITH PROPOFOL N/A 12/31/2012   Procedure: ESOPHAGOGASTRODUODENOSCOPY (EGD) WITH PROPOFOL;  Surgeon: Rogene Houston, MD;  [facility];  Service: Endoscopy;  Laterality: N/A;  GE junction 37,   ESOPHAGOGASTRODUODENOSCOPY (EGD) WITH PROPOFOL N/A 07/01/2013   Procedure: ESOPHAGOGASTRODUODENOSCOPY (EGD) WITH PROPOFOL;  Surgeon: Rogene Houston, MD;  [facility];  Service: Endoscopy;  Laterality: N/A;  [place] N/A 12/31/2012   Procedure: MALONEY DILATION;  Surgeon: Rogene Houston, MD;  [facility];  Service: Endoscopy;  Laterality: N/A;  used a # 54,#56   MALONEY DILATION N/A 07/01/2013   Procedure: MALONEY DILATION;  Surgeon: Rogene Houston, MD;  [facility];  Service: Endoscopy;  Laterality: N/A;  54/58; no heme present   POLYPECTOMY  02/05/2018   Procedure: POLYPECTOMY;  Surgeon: Rogene Houston, MD;  [facility];  Service: Endoscopy;;  distal rectum (HS)   STONE EXTRACTION WITH BASKET Right 12/22/2019   Procedure: STONE EXTRACTION WITH BASKET;  Surgeon: Cleon Gustin, MD;  [facility];  Service: Urology;  Laterality: Right;   Past Surgical History:  Procedure Laterality Date   ABDOMINAL HYSTERECTOMY     Arm Surgery Bilateral    due to fall-pt broke both forearms   BIOPSY N/A 07/01/2013   Procedure: BIOPSY;  Surgeon: Rogene Houston, MD;  [facility];  Service: Endoscopy;  Laterality: N/A;   CHOLECYSTECTOMY     COLONOSCOPY  08/06/2011   Procedure: COLONOSCOPY;  Surgeon: Rogene Houston, MD;  [facility];  Service: Endoscopy;  Laterality: N/A;  215   COLONOSCOPY WITH PROPOFOL N/A 12/31/2012   Procedure: COLONOSCOPY WITH PROPOFOL;  Surgeon: Rogene Houston, MD;  [facility];  Service: Endoscopy;  Laterality: N/A;  in cecum at 0814, total withdrawal time 38mn   COLONOSCOPY WITH PROPOFOL N/A 02/05/2018   Procedure: COLONOSCOPY WITH PROPOFOL;  Surgeon: RRogene Houston MD;  [facility];  Service: Endoscopy;  Laterality: N/A;  1:25   CORONARY ANGIOPLASTY WITH STENT PLACEMENT     CYSTOSCOPY W/ URETERAL STENT PLACEMENT Right 11/10/2019   [place] URETEROSCOPY AND STENT PLACEMENT Right 12/22/2019   Procedure: CYSTOSCOPY WITH RIGHT URETERAL STENT REMOVAL; RIGHT RETROGRADE PYELOGRAM,RIGHT URETEROSCOPY;  Surgeon: MCleon Gustin MD;  [facility];  Service: Urology;  Laterality: Right;   ESOPHAGEAL DILATION N/A 06/07/2015   Procedure: ESOPHAGEAL DILATION;  Surgeon: NRogene Houston MD;  [facility];  Service: Endoscopy;  Laterality: N/A;   ESOPHAGOGASTRODUODENOSCOPY N/A 06/07/2015   Procedure: ESOPHAGOGASTRODUODENOSCOPY (EGD);  Surgeon: NMechele Dawley  Laural Golden, MD;  [facility];  Service: Endoscopy;  Laterality: N/A;  2:45 - moved to 1:00 - Ann to notify pt   ESOPHAGOGASTRODUODENOSCOPY (EGD) WITH PROPOFOL N/A 12/31/2012   Procedure: ESOPHAGOGASTRODUODENOSCOPY (EGD) WITH PROPOFOL;  Surgeon: Rogene Houston, MD;  [facility];  Service: Endoscopy;  Laterality: N/A;  GE junction 37,   ESOPHAGOGASTRODUODENOSCOPY (EGD) WITH PROPOFOL N/A 07/01/2013   Procedure: ESOPHAGOGASTRODUODENOSCOPY (EGD) WITH PROPOFOL;  Surgeon: Rogene Houston, MD;  [facility];  Service: Endoscopy;  Laterality: N/A;  [place] N/A 12/31/2012   Procedure: MALONEY DILATION;  Surgeon: Rogene Houston, MD;  [facility];  Service: Endoscopy;  Laterality: N/A;  used a # 54,#56   MALONEY DILATION N/A 07/01/2013   Procedure: MALONEY DILATION;  Surgeon: Rogene Houston, MD;  [facility];  Service: Endoscopy;  Laterality: N/A;  54/58; no heme present   POLYPECTOMY   02/05/2018   Procedure: POLYPECTOMY;  Surgeon: Rogene Houston, MD;  [facility];  Service: Endoscopy;;  distal rectum (HS)   STONE EXTRACTION WITH BASKET Right 12/22/2019   Procedure: STONE EXTRACTION WITH BASKET;  Surgeon: Cleon Gustin, MD;  [facility];  Service: Urology;  Laterality: Right;   Past Medical History:  Diagnosis Date   Anxiety and depression    Arthritis    CAD (coronary artery disease)    Stent placement circumflex coronary 2007, catheterization 2008 patent stents. 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    High cholesterol    Hx of blood clots    Hypertension    MI (myocardial infarction) ([place])    2007   Obstructive sleep apnea    CPAP, setting ?2   Palpitations    Sleep apnea    Tachycardia    BP (!) 143/81    Pulse 72    Temp 99.4 F (37.4 C)    Ht 5' 4"  (1.626 m)    Wt 249 lb (112.9 kg)    SpO2 95%    BMI 42.74 kg/m   Opioid Risk Score:   Fall Risk Score:  `1  Depression screen PHQ 2/9  Depression screen [facility] 2/9 01/24/2020 11/18/2019 10/26/2019 09/26/2019 03/24/2019 07/16/2018 06/12/2017  Decreased Interest 0 1 0 0 1 1 3   Down, Depressed, Hopeless 0 1 0 0 1 1 3   PHQ - 2 Score 0 2 0 0 2 2 6   Altered sleeping - - - - - - 3  Tired, decreased energy - - - - - - 3  Change in appetite - - - - - - 0  Feeling bad or failure about yourself  - - - - - - 3  Trouble concentrating - - - - - - 3  Moving slowly or fidgety/restless - - - - - - 1  Suicidal thoughts - - - - - - 0  PHQ-9 Score - - - - - - 19  Difficult doing work/chores - - - - - - Very difficult   Some recent  data might be hidden    Review of Systems  Musculoskeletal: Positive for back pain and myalgias.       Shoulder pain  All other systems reviewed and are negative.      Objective:   Physical Exam Vitals and nursing note reviewed.  Constitutional:      Appearance: Normal appearance.  Neck:     Comments: Cervical Paraspinal Tenderness: C-5-C-6 Cardiovascular:     Rate and Rhythm: Normal rate and regular rhythm.     Pulses: Normal pulses.     Heart sounds: Normal heart sounds.  Pulmonary:     Effort: Pulmonary effort is normal.     Breath sounds: Normal breath sounds.  Musculoskeletal:     Cervical back: Normal range of motion and neck supple.     Comments: Normal Muscle Bulk and Muscle Testing Reveals:  Upper Extremities: Full ROM and Muscle Strength 5/5 Bilateral AC Joint Tenderness  Thoracic and Lumbar Hypersensitivity Bilateral Greater Trochanter Tenderness Lower Extremities: Full ROM and Muscle Strength 5/5 Anasarca Noted Arises from Table Slowly Antalgic  Gait   Skin:    General: Skin is warm and dry.  Neurological:     Mental Status: She is alert and oriented to person, place, and time.  Psychiatric:        Mood and Affect: Mood normal.        Behavior: Behavior normal.           Assessment & Plan:  1.Lumbar pain lumbar spondylosis: Encouraged to continue toincrease Activity as tolerated.03/27/2020 Continue current medication regimen. Refilled:MS Contin 30 MG one tablet every12hours #60. BoyfriendJoe is dispensing Ms. Tisdell Medication she reports. We will continue the opioid monitoring program, this consists of regular clinic visits, examinations, urine drug screen, pill counts as well as use of [place] Controlled Substance Reporting system. A 12 month History has been reviewed on the [place] Controlled Substance Reporting Systemon01/01/2021. 2. Lumbar Radiculitis: Continuecurrent medication regimen  withGabapentin.03/27/2020 3. Bilateral Knee Pain/ Degenerative:Continue Voltaren Gel. Continue to Monitor.03/27/2020 4. Severe diabetic poly neuropathy: Continuecurrent medication regimen withGabapentin.03/27/2020 5. Insomnia: ContinueArmodafinilPsychiatry Following.03/27/2020 6.Anxiety/Depression:Continue Current Medication regimen as prescribed by PCP and Psychiatry.03/27/2020 7. De Quervains Tenosynovitis: No Complaints Today.03/27/2020 8.BilateralGreater Trochanteric Bursitis:Continue with Ice and Heat Therapy. Continue to Monitor.03/27/2020. 9. Cervicalgia/ Cervical Radiculitis:Continue Gabapentin.Continue with HEP as Tolerated and continue to monitor.03/27/2020 10. Chronic Bilateral Thoracic Back Pain: Continue HEP as Tolerated. Continue current medication regimen. Continue to monitor.03/27/2020.  F/U in 1 month

## 2020-04-10 ENCOUNTER — Telehealth: Payer: Self-pay | Admitting: "Endocrinology

## 2020-04-10 ENCOUNTER — Other Ambulatory Visit: Payer: Self-pay

## 2020-04-10 ENCOUNTER — Ambulatory Visit (INDEPENDENT_AMBULATORY_CARE_PROVIDER_SITE_OTHER): Payer: Medicare Other | Admitting: "Endocrinology

## 2020-04-10 ENCOUNTER — Encounter: Payer: Self-pay | Admitting: "Endocrinology

## 2020-04-10 VITALS — BP 142/60 | HR 76 | Temp 99.1°F | Ht 64.0 in | Wt 246.4 lb

## 2020-04-10 DIAGNOSIS — I10 Essential (primary) hypertension: Secondary | ICD-10-CM

## 2020-04-10 DIAGNOSIS — E782 Mixed hyperlipidemia: Secondary | ICD-10-CM

## 2020-04-10 DIAGNOSIS — E1159 Type 2 diabetes mellitus with other circulatory complications: Secondary | ICD-10-CM

## 2020-04-10 NOTE — Telephone Encounter (Signed)
error 

## 2020-04-10 NOTE — Telephone Encounter (Signed)
Pt is calling and states at her appt today she forgot to tell Dr. Dorris Fetch she needs a refill on both of her insulins. Patient was unable to give me the names of them. She said she knows it is 2.  [place], [place] Phone:  [phone]  Fax:  [phone]

## 2020-04-10 NOTE — Progress Notes (Signed)
04/10/2020   Endocrinology follow-up note   Subjective:    Patient ID: [name], female    DOB: [birth year]. Patient is returning with his glycemic profile improving from last visit.  She has been on regular follow-up for  management of currently uncontrolled, complicated type 2 diabetes; hyperlipidemia; hypertension.  Past Medical History:  Diagnosis Date  . Anxiety and depression   . Arthritis   . CAD (coronary artery disease)    Stent placement circumflex coronary 2007, catheterization 2008 patent stents. Normal LV function  . Chest pain   . CHF (congestive heart failure) ([place])   . COPD (chronic obstructive pulmonary disease) (H[facility])    no home O2  . Depression   . Diabetes mellitus    Insulin dependent  . Diabetic polyneuropathy ([facility])     severe on multiple medications  . Dyslipidemia   . GERD (gastroesophageal reflux disease)   . Headache(784.0)   . Heart attack ([place])   . Heartburn   . 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 High cholesterol   . Hx of blood clots   . Hypertension   . MI (myocardial infarction) ([place])    2007  . Obstructive sleep apnea    CPAP, setting ?2  . Palpitations   . Sleep apnea   . Tachycardia    Past Surgical History:  Procedure Laterality Date  . ABDOMINAL HYSTERECTOMY    . Arm Surgery Bilateral    due to fall-pt broke both forearms  . BIOPSY N/A 07/01/2013   Procedure: BIOPSY;  Surgeon: Rogene Houston, MD;  Location: [facility];  Service: Endoscopy;  Laterality: N/A;  . CHOLECYSTECTOMY    . COLONOSCOPY  08/06/2011   Procedure: COLONOSCOPY;  Surgeon: Rogene Houston, MD;  [facility];  Service: Endoscopy;  Laterality: N/A;  215  . COLONOSCOPY WITH PROPOFOL N/A 12/31/2012   Procedure: COLONOSCOPY WITH PROPOFOL;  Surgeon: Rogene Houston, MD;  Location: [facility];  Service: Endoscopy;  Laterality: N/A;  in cecum at 0814, total withdrawal time 37mn  . COLONOSCOPY WITH PROPOFOL N/A 02/05/2018   Procedure: COLONOSCOPY WITH PROPOFOL;   Surgeon: RRogene Houston MD;  [facility];  Service: Endoscopy;  Laterality: N/A;  1:25  . CORONARY ANGIOPLASTY WITH STENT PLACEMENT    . CYSTOSCOPY W/ URETERAL STENT PLACEMENT Right 11/10/2019   at [facility] . CYSTOSCOPY WITH RETROGRADE PYELOGRAM, URETEROSCOPY AND STENT PLACEMENT Right 12/22/2019   Procedure: CYSTOSCOPY WITH RIGHT URETERAL STENT REMOVAL; RIGHT RETROGRADE PYELOGRAM,RIGHT URETEROSCOPY;  Surgeon: MCleon Gustin MD;  Location: [facility];  Service: Urology;  Laterality: Right;  . ESOPHAGEAL DILATION N/A 06/07/2015   Procedure: ESOPHAGEAL DILATION;  Surgeon: NRogene Houston MD;  [facility];  Service: Endoscopy;  Laterality: N/A;  . ESOPHAGOGASTRODUODENOSCOPY N/A 06/07/2015   Procedure: ESOPHAGOGASTRODUODENOSCOPY (EGD);  Surgeon: NRogene Houston MD;  [facility];  Service: Endoscopy;  Laterality: N/A;  2:45 - moved to 1:00 - Ann to notify pt  . ESOPHAGOGASTRODUODENOSCOPY (EGD) WITH PROPOFOL N/A 12/31/2012   Procedure: ESOPHAGOGASTRODUODENOSCOPY (EGD) WITH PROPOFOL;  Surgeon: NRogene Houston MD;  Location: [facility];  Service: Endoscopy;  Laterality: N/A;  GE junction 37,  . ESOPHAGOGASTRODUODENOSCOPY (EGD) WITH PROPOFOL N/A 07/01/2013   Procedure: ESOPHAGOGASTRODUODENOSCOPY (EGD) WITH PROPOFOL;  Surgeon: NRogene Houston MD;  [facility]: [facility];  Service: Endoscopy;  Laterality: N/A;  950  . MALONEY DILATION N/A 12/31/2012   Procedure: MVenia MinksDILATION;  Surgeon: NRogene Houston MD;  Location:  [facility];  Service: Endoscopy;  Laterality: N/A;  used a # 54,#56  . MALONEY DILATION N/A 07/01/2013   Procedure: Venia Minks DILATION;  Surgeon: Rogene Houston, MD;  Location: [facility];  Service: Endoscopy;  Laterality: N/A;  54/58; no heme present  . POLYPECTOMY  02/05/2018   Procedure: POLYPECTOMY;  Surgeon: Rogene Houston, MD;  [facility];  Service: Endoscopy;;  distal rectum (HS)  . STONE EXTRACTION WITH BASKET Right 12/22/2019   Procedure: STONE  EXTRACTION WITH BASKET;  Surgeon: Cleon Gustin, MD;  Location: [facility];  Service: Urology;  Laterality: Right;   Social History   Socioeconomic History  . Marital status: Divorced    Spouse name: Not on file  . Number of children: 4  . Years of education: Not on file  . Highest education level: Not on file  Occupational History  . Occupation: Disabled  Tobacco Use  . Smoking status: Never Smoker  . Smokeless tobacco: Never Used  Vaping Use  . Vaping Use: Never used  Substance and Sexual Activity  . Alcohol use: No    Alcohol/week: 0.0 standard drinks  . Drug use: No  . Sexual activity: Not on file  Other Topics Concern  . Not on file  Social History Narrative   Patient does not get regular exercise   Denies caffeine use    Social Determinants of Health   Financial Resource Strain: Not on file  Food Insecurity: Not on file  Transportation Needs: Not on file  Physical Activity: Not on file  Stress: Not on file  Social Connections: Not on file   Outpatient Encounter Medications as of 04/10/2020  Medication Sig  . ALPRAZolam (XANAX) 0.5 MG tablet Take 0.5 mg by mouth 2 (two) times daily as needed.  . Armodafinil 150 MG tablet Take 150 mg by mouth daily.  Marland Kitchen aspirin EC 81 MG tablet Take 1 tablet (81 mg total) by mouth daily.  Marland Kitchen atorvastatin (LIPITOR) 40 MG tablet Take 40 mg by mouth daily.  . B-D ULTRAFINE III SHORT PEN 31G X 8 MM MISC Inject into the skin 4 (four) times daily.  . Cholecalciferol (VITAMIN D3) 125 MCG (5000 UT) CAPS TAKE ONE CAPSULE BY MOUTH DAILY (Patient taking differently: Take 5,000 Units by mouth daily.)  . clotrimazole-betamethasone (LOTRISONE) cream Apply to affected area 2 times daily for 21 days  . diclofenac Sodium (VOLTAREN) 1 % GEL APPLY TO BOTH KNEES THREE TIMES DAILY  . diltiazem (CARDIZEM CD) 240 MG 24 hr capsule Take 240 mg by mouth daily.  Marland Kitchen doxepin (SINEQUAN) 25 MG capsule Take 75 mg by mouth at bedtime.   Marland Kitchen ELIQUIS 5 MG TABS tablet  Take 5 mg by mouth in the morning and at bedtime.  Marland Kitchen estradiol (ESTRACE) 0.5 MG tablet Take 0.5 mg by mouth daily.   . FEROSUL 325 (65 Fe) MG tablet Take 325 mg by mouth every morning.  . furosemide (LASIX) 20 MG tablet Take 20 mg by mouth daily.   Marland Kitchen gabapentin (NEURONTIN) 400 MG capsule Take 400 mg by mouth 3 (three) times daily.  Marland Kitchen glipiZIDE (GLUCOTROL XL) 5 MG 24 hr tablet TAKE 1 TABLET BY MOUTH EVERY DAY WITH BREAKFAST (Patient taking differently: Take 5 mg by mouth daily with breakfast.)  . lisinopril (ZESTRIL) 5 MG tablet Take 1 tablet (5 mg total) by mouth daily.  . meloxicam (MOBIC) 7.5 MG tablet Take 7.5 mg by mouth in the morning and at bedtime.   . metFORMIN (GLUCOPHAGE)  500 MG tablet TAKE 1 TABLET BY MOUTH TWICE DAILY (Patient taking differently: Take 500 mg by mouth 2 (two) times daily with a meal.)  . metoCLOPramide (REGLAN) 5 MG tablet Take 5 mg by mouth 3 (three) times daily as needed for nausea or vomiting.   . metoprolol succinate (TOPROL-XL) 100 MG 24 hr tablet Take 100 mg by mouth daily. Take with or immediately following a meal.  . montelukast (SINGULAIR) 10 MG tablet Take 10 mg by mouth at bedtime.   Marland Kitchen morphine (MS CONTIN) 30 MG 12 hr tablet Take 1 tablet (30 mg total) by mouth every 12 (twelve) hours.  . nitroGLYCERIN (NITROSTAT) 0.4 MG SL tablet Place 1 tablet (0.4 mg total) under the tongue every 5 (five) minutes x 3 doses as needed. For chest pains  . NOVOLOG FLEXPEN 100 UNIT/ML FlexPen Inject into the skin.  Marland Kitchen ondansetron (ZOFRAN) 4 MG tablet Take 4 mg by mouth every 6 (six) hours as needed for nausea or vomiting.  . pantoprazole (PROTONIX) 40 MG tablet TAKE 1 TABLET BY MOUTH TWICE DAILY BEFORE MEALS (Patient taking differently: Take 40 mg by mouth 2 (two) times daily before a meal.)  . polyethylene glycol powder (GLYCOLAX/MIRALAX) 17 GM/SCOOP powder Take 1 Container by mouth daily as needed for moderate constipation.   . potassium chloride (K-DUR,KLOR-CON) 10 MEQ tablet  Take 10 mEq by mouth 2 (two) times daily.  Marland Kitchen PROAIR HFA 108 (90 Base) MCG/ACT inhaler Inhale 2 puffs into the lungs every 4 (four) hours as needed for wheezing or shortness of breath.   . promethazine (PHENERGAN) 25 MG tablet Take 1 tablet (25 mg total) by mouth 4 (four) times daily as needed.  . sucralfate (CARAFATE) 1 g tablet Take 1 g by mouth 4 (four) times daily.  Marland Kitchen topiramate (TOPAMAX) 200 MG tablet Take 200 mg by mouth at bedtime.  Tyler Aas FLEXTOUCH 100 UNIT/ML FlexTouch Pen Inject 70 Units into the skin at bedtime.   No facility-administered encounter medications on file as of 04/10/2020.   ALLERGIES: Allergies  Allergen Reactions  . Amphetamine-Dextroamphetamine Swelling  . Nitrofuran Derivatives Itching and Swelling  . Amphetamine-Dextroamphet Er Swelling  . Pregabalin Swelling  . Topiramate Other (See Comments)    Tongue tingle  . Verelan [Verapamil] Rash   VACCINATION STATUS:  There is no immunization history on file for this patient.  Diabetes She presents for her follow-up diabetic visit. She has type 2 diabetes mellitus. Onset time: She was diagnosed at approximate age of 22 years. Her disease course has been improving. There are no hypoglycemic associated symptoms. Pertinent negatives for hypoglycemia include no confusion, headaches, pallor or seizures. Pertinent negatives for diabetes include no chest pain, no fatigue, no polydipsia, no polyphagia and no polyuria. There are no hypoglycemic complications. Symptoms are improving. Diabetic complications include autonomic neuropathy, heart disease, peripheral neuropathy, PVD and retinopathy. Risk factors for coronary artery disease include dyslipidemia, diabetes mellitus, obesity, hypertension and sedentary lifestyle. Current diabetic treatments: Lantus 65 units daily at bedtime, Humalog 15 units 3 times a day before meals. She mentions allergy to Victoza, By[place], and metformin. Her weight is fluctuating minimally. She is  following a generally unhealthy diet. When asked about meal planning, she reported none. She has not had a previous visit with a dietitian. She never participates in exercise. Her home blood glucose trend is decreasing steadily. Her breakfast blood glucose range is generally 130-140 mg/dl. Her bedtime blood glucose range is generally 140-180 mg/dl. Her overall blood glucose range is 140-180  mg/dl. (She presents with improved glycemic profile.  She denies hypoglycemia.  Her recent A1c was 9.5%.  Her fasting blood glucose ranges from 130-145, postprandial blood glucose range between 140-180.  ) An ACE inhibitor/angiotensin II receptor blocker is not being taken. Eye exam is current.  Hyperlipidemia This is a chronic problem. The current episode started more than 1 year ago. The problem is uncontrolled. Recent lipid tests were reviewed and are high. Exacerbating diseases include diabetes and obesity. Pertinent negatives include no chest pain, myalgias or shortness of breath. Current antihyperlipidemic treatment includes statins. Risk factors for coronary artery disease include diabetes mellitus, dyslipidemia, hypertension, obesity and a sedentary lifestyle.  Hypertension This is a chronic problem. The current episode started more than 1 year ago. Pertinent negatives include no chest pain, headaches, palpitations or shortness of breath. Risk factors for coronary artery disease include diabetes mellitus, dyslipidemia, obesity and sedentary lifestyle. Hypertensive end-organ damage includes CAD/MI, PVD and retinopathy.    Review of systems: Limited as above.  Objective:    BP (!) 142/60   Pulse 76   Temp 99.1 F (37.3 C) (Oral)   Ht 5' 4"  (1.626 m)   Wt 246 lb 6.4 oz (111.8 kg)   BMI 42.29 kg/m   Wt Readings from Last 3 Encounters:  04/10/20 246 lb 6.4 oz (111.8 kg)  03/27/20 249 lb (112.9 kg)  03/26/20 246 lb 3.2 oz (111.7 kg)     CMP     Component Value Date/Time   NA 140 10/27/2019 1130   NA  143 11/06/2016 1112   K 4.3 10/27/2019 1130   CL 107 10/27/2019 1130   CO2 20 (L) 10/27/2019 1130   GLUCOSE 213 (H) 10/27/2019 1130   BUN 16 10/27/2019 1130   BUN 11 11/06/2016 1112   CREATININE 0.66 10/27/2019 1130   CREATININE 0.70 11/02/2018 1105   CALCIUM 8.9 10/27/2019 1130   PROT 6.1 11/02/2018 1105   PROT 5.5 (L) 11/06/2016 1112   ALBUMIN 2.9 (L) 12/07/2017 1413   ALBUMIN 3.4 (L) 11/06/2016 1112   AST 13 11/02/2018 1105   ALT 13 11/02/2018 1105   ALKPHOS 65 12/07/2017 1413   BILITOT 0.7 11/02/2018 1105   BILITOT 0.3 11/06/2016 1112   GFRNONAA >60 10/27/2019 1130   GFRNONAA 91 11/02/2018 1105   GFRAA >60 10/27/2019 1130   GFRAA 105 11/02/2018 1105   Diabetic Labs (most recent): Lab Results  Component Value Date   HGBA1C 9.5 02/29/2020   HGBA1C 9.7 (H) 10/27/2019   HGBA1C 10.1 (H) 11/02/2018    Lipid Panel     Component Value Date/Time   CHOL 173 07/06/2018 0936   CHOL 184 04/15/2016 1121   TRIG 109 07/06/2018 0936   HDL 62 07/06/2018 0936   HDL 77 04/15/2016 1121   CHOLHDL 2.8 07/06/2018 0936   VLDL UNABLE TO CALCULATE IF TRIGLYCERIDE OVER 400 mg/dL 02/18/2017 0528   LDLCALC 90 07/06/2018 0936     Assessment & Plan:   1. Type 2 diabetes mellitus with vascular disease ([name]) - Patient has currently uncontrolled symptomatic type 2 DM since  70 years of age.  She presents with improved glycemic profile.  She denies hypoglycemia.  Her recent A1c was 9.5%.  Her fasting blood glucose ranges from 130-145, postprandial blood glucose range between 140-180.    Recent labs reviewed.  - Her diabetes is complicated by obesity/sedentary life, coronary artery disease status post stent placement and patient remains at a high risk for more acute and chronic complications  of diabetes which include CAD, CVA, CKD, retinopathy, and neuropathy. These are all discussed in detail with the patient.  - I have counseled the patient on diet management and weight loss, by adopting a  carbohydrate restricted/protein rich diet.  - she acknowledges that there is a room for improvement in her food and drink choices. - Suggestion is made for her to avoid simple carbohydrates  from her diet including Cakes, Sweet Desserts, Ice Cream, Soda (diet and regular), Sweet Tea, Candies, Chips, Cookies, Store Bought Juices, Alcohol in Excess of  1-2 drinks a day, Artificial Sweeteners,  Coffee Creamer, and "Sugar-free" Products, Lemonade. This will help patient to have more stable blood glucose profile and potentially avoid unintended weight gain.  - Patient is advised to stick to a routine mealtimes to eat 3 meals  a day and avoid unnecessary snacks ( to snack only to correct hypoglycemia).   - I have approached patient with the following individualized plan to manage diabetes and patient agrees:   - She continues to have normal renal function, would continue to benefit from Metformin treatment.  She will not need prandial insulin for now.   -Accordingly, she is advised to continue Tresiba 70 units nightly, continue to monitor blood glucose twice a day-daily before breakfast and at bedtime.  This patient will benefit from a CGM.  She will be considered for the freestyle libre device next visit. She is advised to continue  Metformin 500 mg p.o. twice daily, continue glipizide 5 mg XL p.o. daily at breakfast.   -Patient is encouraged to call clinic for blood glucose levels less than 70 or above 200 mg /dl. -She has intolerance to GLP-1 receptor agonists.    2) BP/HTN: -Her blood pressure is controlled from near target levels.  She has adequate medications, advised to continue including metoprolol XL 50 mg p.o. daily.  3) Lipids/HPL: She has uncontrolled hypertriglyceridemia , still high at 439  LDL better at 50, HDL at 77. She is advised to continue Lipitor 40 mg p.o. nightly.   Better control of diabetes will help for triglycerides.   4) Chronic Care/Health Maintenance:  -Patient is  on Statin medications and encouraged to continue to follow up with Ophthalmology, Podiatrist at least yearly or according to recommendations, and advised to   stay away from smoking. I have recommended yearly flu vaccine and pneumonia vaccination at least every 5 years;  and  sleep for at least 7 hours a day. -She will benefit from a set of diabetic shoes, paperwork filled for her. She had a moderate temperature 99.1 F this afternoon with no localization.  She is advised to take Tylenol 1 dose and rest.  If she develops fever, she is advised to Contact her PMD.  - I advised patient to maintain close follow up with Glenda Chroman, MD for primary care needs.  - Time spent on this patient care encounter:  35 min, of which > 50% was spent in  counseling and the rest reviewing her blood glucose logs , discussing her hypoglycemia and hyperglycemia episodes, reviewing her current and  previous labs / studies  ( including abstraction from other facilities) and medications  doses and developing a  long term treatment plan and documenting her care.   Please refer to Patient Instructions for Blood Glucose Monitoring and Insulin/Medications Dosing Guide"  in media tab for additional information. Please  also refer to " Patient Self Inventory" in the Media  tab for reviewed elements of pertinent patient  history.  [name] participated in the discussions, expressed understanding, and voiced agreement with the above plans.  All questions were answered to her satisfaction. she is encouraged to contact clinic should she have any questions or concerns prior to her return visit.    Follow up plan: - Patient to go to emergency room for better evaluation of chest pain. Return in about 3 months (around 07/09/2020) for F/U with Pre-visit Labs, Meter, Logs, A1c here.Glade Lloyd, MD Phone: [phone]  Fax: [phone]  This note was partially dictated with voice recognition software. Similar sounding words can be  transcribed inadequately or may not  be corrected upon review.  04/10/2020, 1:36 PM

## 2020-04-10 NOTE — Patient Instructions (Signed)

## 2020-04-11 NOTE — Telephone Encounter (Signed)
Called patient and advised, verbalized understanding 

## 2020-04-11 NOTE — Telephone Encounter (Signed)
I returned call to patient, she is only on [place] and that was sent in on 03/26/20 to [facility], had to leave a VM as no answer.

## 2020-04-11 NOTE — Telephone Encounter (Signed)
Please advise, I don not see that she is taking Novolog

## 2020-04-11 NOTE — Telephone Encounter (Signed)
Returned call and had to leave a VM as no answer

## 2020-04-11 NOTE — Telephone Encounter (Signed)
Pt returned call and said she is also on Novolog. Please try to call back

## 2020-04-11 NOTE — Telephone Encounter (Signed)
That is right, as far as insulin , she is only on Tresiba 70 units qhs.  We will decide next visit if she needs Novolog.

## 2020-04-26 ENCOUNTER — Encounter: Payer: Medicare Other | Attending: Registered Nurse | Admitting: Registered Nurse

## 2020-04-26 ENCOUNTER — Encounter: Payer: Self-pay | Admitting: Registered Nurse

## 2020-04-26 ENCOUNTER — Other Ambulatory Visit: Payer: Self-pay

## 2020-04-26 VITALS — BP 146/70 | HR 79 | Temp 98.1°F | Ht 64.0 in | Wt 248.0 lb

## 2020-04-26 DIAGNOSIS — M1711 Unilateral primary osteoarthritis, right knee: Secondary | ICD-10-CM

## 2020-04-26 DIAGNOSIS — M7061 Trochanteric bursitis, right hip: Secondary | ICD-10-CM

## 2020-04-26 DIAGNOSIS — M48062 Spinal stenosis, lumbar region with neurogenic claudication: Secondary | ICD-10-CM

## 2020-04-26 DIAGNOSIS — M5412 Radiculopathy, cervical region: Secondary | ICD-10-CM | POA: Diagnosis not present

## 2020-04-26 DIAGNOSIS — Z79891 Long term (current) use of opiate analgesic: Secondary | ICD-10-CM

## 2020-04-26 DIAGNOSIS — M5416 Radiculopathy, lumbar region: Secondary | ICD-10-CM

## 2020-04-26 DIAGNOSIS — M542 Cervicalgia: Secondary | ICD-10-CM | POA: Diagnosis not present

## 2020-04-26 DIAGNOSIS — M546 Pain in thoracic spine: Secondary | ICD-10-CM | POA: Diagnosis not present

## 2020-04-26 DIAGNOSIS — G894 Chronic pain syndrome: Secondary | ICD-10-CM

## 2020-04-26 DIAGNOSIS — Z5181 Encounter for therapeutic drug level monitoring: Secondary | ICD-10-CM

## 2020-04-26 DIAGNOSIS — M7062 Trochanteric bursitis, left hip: Secondary | ICD-10-CM

## 2020-04-26 DIAGNOSIS — M1712 Unilateral primary osteoarthritis, left knee: Secondary | ICD-10-CM

## 2020-04-26 MED ORDER — MORPHINE SULFATE ER 30 MG PO TBCR: 30.0000 mg | EXTENDED_RELEASE_TABLET | Freq: Two times a day (BID) | ORAL | 0 refills | Status: DC

## 2020-04-26 NOTE — Progress Notes (Signed)
Subjective:    Patient ID: [name], female    DOB: 02/15/1951, 70 y.o.   MRN: [id number]  HPI : [name] is a 70 y.o. female whose appointment was changed to a My-Chart Video visit, [name] significant other was unable to bring her to the appointment. He had a fall and was unable to drive at this time, emotional support given. She states her pain is located in her neck radiating into her bilateral shoulders, mid- lower back pain radiating into her bilateral hips and bilateral lower extremities. She also reports bilateral knee pain and bilateral feet pain with tingling and burning. She rates her pain 5. Her  current exercise regime is walking and performing stretching exercises.  [name] Morphine equivalent is 60.00 MME. She is also prescribed alprazolam by Dr. Woody Seller. We have discussed the black box warning of using opioids and benzodiazepines. I highlighted the dangers of using these drugs together and discussed the adverse events including respiratory suppression, overdose, cognitive impairment and importance of compliance with current regimen. We will continue to monitor and adjust as indicated.      Last Oral Swab was Performed on 01/24/2020, it was consistent.   Pain Inventory Average Pain 7 Pain Right Now 5 My pain is constant, sharp, burning, dull, stabbing, tingling and aching  In the last 24 hours, has pain interfered with the following? General activity 8 Relation with others 7 Enjoyment of life 7 What TIME of day is your pain at its worst? morning  and night Sleep (in general) Fair  Pain is worse with: some activites Pain improves with: medication Relief from Meds: 7  Family History  Problem Relation Age of Onset  . Heart attack Mother   . Colon cancer Mother   . Heart attack Father   . Stroke Sister   . Hemophilia Child   . Cancer Other   . Coronary artery disease Other   . Cancer Brother   . Cancer Maternal Aunt   . Cancer Maternal Uncle   .  Cancer Paternal Aunt   . Cancer Paternal Uncle   . Cancer Brother    Social History   Socioeconomic History  . Marital status: Divorced    Spouse name: Not on file  . Number of children: 4  . Years of education: Not on file  . Highest education level: Not on file  Occupational History  . Occupation: Disabled  Tobacco Use  . Smoking status: Never Smoker  . Smokeless tobacco: Never Used  Vaping Use  . Vaping Use: Never used  Substance and Sexual Activity  . Alcohol use: No    Alcohol/week: 0.0 standard drinks  . Drug use: No  . Sexual activity: Not on file  Other Topics Concern  . Not on file  Social History Narrative   Patient does not get regular exercise   Denies caffeine use    Social Determinants of Health   Financial Resource Strain: Not on file  Food Insecurity: Not on file  Transportation Needs: Not on file  Physical Activity: Not on file  Stress: Not on file  Social Connections: Not on file   Past Surgical History:  Procedure Laterality Date  . ABDOMINAL HYSTERECTOMY    . Arm Surgery Bilateral    due to fall-pt broke both forearms  . BIOPSY N/A 07/01/2013   Procedure: BIOPSY;  Surgeon: Rogene Houston, MD;  [facility];  Service: Endoscopy;  Laterality: N/A;  . CHOLECYSTECTOMY    .  COLONOSCOPY  08/06/2011   Procedure: COLONOSCOPY;  Surgeon: Rogene Houston, MD;  [facility];  Service: Endoscopy;  Laterality: N/A;  215  . COLONOSCOPY WITH PROPOFOL N/A 12/31/2012   Procedure: COLONOSCOPY WITH PROPOFOL;  Surgeon: Rogene Houston, MD;  Location: [facility];  Service: Endoscopy;  Laterality: N/A;  in cecum at 0814, total withdrawal time 62mn  . COLONOSCOPY WITH PROPOFOL N/A 02/05/2018   Procedure: COLONOSCOPY WITH PROPOFOL;  Surgeon: RRogene Houston MD;  [facility];  Service: Endoscopy;  Laterality: N/A;  1:25  . CORONARY ANGIOPLASTY WITH STENT PLACEMENT    . CYSTOSCOPY W/ URETERAL STENT PLACEMENT [facility]   [facility]  CYSTOSCOPY WITH RETROGRADE PYELOGRAM, URETEROSCOPY AND STENT PLACEMENT Right 12/22/2019   Procedure: CYSTOSCOPY WITH RIGHT URETERAL STENT REMOVAL; RIGHT RETROGRADE PYELOGRAM,RIGHT URETEROSCOPY;  Surgeon: MCleon Gustin MD;  [facility];  Service: Urology;  Laterality: Right;  . ESOPHAGEAL DILATION N/A 06/07/2015   Procedure: ESOPHAGEAL DILATION;  Surgeon: NRogene Houston MD;  [facility];  Service: Endoscopy;  Laterality: N/A;  . ESOPHAGOGASTRODUODENOSCOPY N/A 06/07/2015   Procedure: ESOPHAGOGASTRODUODENOSCOPY (EGD);  Surgeon: NRogene Houston MD;  [facility];  Service: Endoscopy;  Laterality: N/A;  2:45 - moved to 1:00 - Ann to notify pt  . ESOPHAGOGASTRODUODENOSCOPY (EGD) WITH PROPOFOL N/A 12/31/2012   Procedure: ESOPHAGOGASTRODUODENOSCOPY (EGD) WITH PROPOFOL;  Surgeon: NRogene Houston MD;  [facility];  Service: Endoscopy;  Laterality: N/A;  GE junction 37,  . ESOPHAGOGASTRODUODENOSCOPY (EGD) WITH PROPOFOL N/A 07/01/2013   Procedure: ESOPHAGOGASTRODUODENOSCOPY (EGD) WITH PROPOFOL;  Surgeon: NRogene Houston MD;  [facility];  Service: Endoscopy;  Laterality: N/A;  950  . MALONEY DILATION N/A 12/31/2012   Procedure: MALONEY DILATION;  Surgeon: NRogene Houston MD;  [facility];  Service: Endoscopy;  Laterality: N/A;  used a # 54,#56  . MALONEY DILATION N/A 07/01/2013   Procedure: MVenia MinksDILATION;  Surgeon: NRogene Houston MD;  [facility];  Service: Endoscopy;  Laterality: N/A;  54/58; no heme present  . POLYPECTOMY  02/05/2018   Procedure: POLYPECTOMY;  Surgeon: RRogene Houston MD;  [facility];  Service: Endoscopy;;  distal rectum (HS)  . STONE EXTRACTION WITH BASKET Right 12/22/2019   Procedure: STONE EXTRACTION WITH BASKET;  Surgeon: MCleon Gustin MD;  [facility];  Service: Urology;  Laterality: Right;   Past Surgical History:  Procedure Laterality Date  . ABDOMINAL HYSTERECTOMY    . Arm Surgery Bilateral     due to fall-pt broke both forearms  . BIOPSY N/A 07/01/2013   Procedure: BIOPSY;  Surgeon: NRogene Houston MD;  [facility];  Service: Endoscopy;  Laterality: N/A;  . CHOLECYSTECTOMY    . COLONOSCOPY  08/06/2011   Procedure: COLONOSCOPY;  Surgeon: NRogene Houston MD;  [facility];  Service: Endoscopy;  Laterality: N/A;  215  . COLONOSCOPY WITH PROPOFOL N/A 12/31/2012   Procedure: COLONOSCOPY WITH PROPOFOL;  Surgeon: NRogene Houston MD;  [facility];  Service: Endoscopy;  Laterality: N/A;  in cecum at 0814, total withdrawal time 152m  . COLONOSCOPY WITH PROPOFOL N/A 02/05/2018   Procedure: COLONOSCOPY WITH PROPOFOL;  Surgeon: ReRogene HoustonMD;  [facility];  Service: Endoscopy;  Laterality: N/A;  1:25  . CORONARY ANGIOPLASTY WITH STENT PLACEMENT    . CYSTOSCOPY W/ URETERAL STENT PLACEMENT [facility]   [facility] CYSTOS[facility] WITH  RETROGRADE PYELOGRAM, URETEROSCOPY AND STENT PLACEMENT Right 12/22/2019   Procedure: CYSTOSCOPY WITH RIGHT URETERAL STENT REMOVAL; RIGHT RETROGRADE PYELOGRAM,RIGHT URETEROSCOPY;  Surgeon: Cleon Gustin, MD;  [facility];  Service: Urology;  Laterality: Right;  . ESOPHAGEAL DILATION N/A 06/07/2015   Procedure: ESOPHAGEAL DILATION;  Surgeon: Rogene Houston, MD;  [facility];  Service: Endoscopy;  Laterality: N/A;  . ESOPHAGOGASTRODUODENOSCOPY N/A 06/07/2015   Procedure: ESOPHAGOGASTRODUODENOSCOPY (EGD);  Surgeon: Rogene Houston, MD;  [facility];  Service: Endoscopy;  Laterality: N/A;  2:45 - moved to 1:00 - Ann to notify pt  . ESOPHAGOGASTRODUODENOSCOPY (EGD) WITH PROPOFOL N/A 12/31/2012   Procedure: ESOPHAGOGASTRODUODENOSCOPY (EGD) WITH PROPOFOL;  Surgeon: Rogene Houston, MD;  [facility];  Service: Endoscopy;  Laterality: N/A;  GE junction 37,  . ESOPHAGOGASTRODUODENOSCOPY (EGD) WITH PROPOFOL N/A 07/01/2013   Procedure: ESOPHAGOGASTRODUODENOSCOPY (EGD) WITH PROPOFOL;  Surgeon: Rogene Houston, MD;  [facility];  Service: Endoscopy;  Laterality: N/A;  950  . MALONEY DILATION N/A 12/31/2012   Procedure: MALONEY DILATION;  Surgeon: Rogene Houston, MD;  [facility];  Service: Endoscopy;  Laterality: N/A;  used a # 54,#56  . MALONEY DILATION N/A 07/01/2013   Procedure: Venia Minks DILATION;  Surgeon: Rogene Houston, MD;  [facility];  Service: Endoscopy;  Laterality: N/A;  54/58; no heme present  . POLYPECTOMY  02/05/2018   Procedure: POLYPECTOMY;  Surgeon: Rogene Houston, MD;  [facility];  Service: Endoscopy;;  distal rectum (HS)  . STONE EXTRACTION WITH BASKET Right 12/22/2019   Procedure: STONE EXTRACTION WITH BASKET;  Surgeon: Cleon Gustin, MD;  [facility];  Service: Urology;  Laterality: Right;   Past Medical History:  Diagnosis Date  . Anxiety and depression   . Arthritis   . CAD (coronary artery disease)    Stent placement circumflex coronary 2007, catheterization 2008 patent stents. Normal LV function  . Chest pain   . CHF (congestive heart failure) ([place])   . COPD (chronic obstructive pulmonary disease) ([facility])    no home O2  . Depression   . Diabetes mellitus    Insulin dependent  . Diabetic polyneuropathy ([facility])     severe on multiple medications  . Dyslipidemia   . GERD (gastroesophageal reflux disease)   . Headache(784.0)   . Heart attack ([place])   . Heartburn   . 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 High cholesterol   . Hx of blood clots   . Hypertension   . MI (myocardial infarction) ([place])    2007  . Obstructive sleep apnea    CPAP, setting ?2  . Palpitations   . Sleep apnea   . Tachycardia    BP (!) 146/70   Pulse 79   Temp 98.1 F (36.7 C)   Ht 5' 4"  (1.626 m)   Wt 248 lb (112.5 kg)   BMI 42.57 kg/m   Opioid Risk Score:   Fall Risk Score:  `1  Depression screen PHQ 2/9  Depression screen [facility] 2/9 01/24/2020 11/18/2019 10/26/2019 09/26/2019 03/24/2019 07/16/2018 06/12/2017  Decreased Interest 0 1 0 0 1 1 3   Down,  Depressed, Hopeless 0 1 0 0 1 1 3   PHQ - 2 Score 0 2 0 0 2 2 6   Altered sleeping - - - - - - 3  Tired, decreased energy - - - - - - 3  Change in appetite - - - - - - 0  Feeling bad  or failure about yourself  - - - - - - 3  Trouble concentrating - - - - - - 3  Moving slowly or fidgety/restless - - - - - - 1  Suicidal thoughts - - - - - - 0  PHQ-9 Score - - - - - - 19  Difficult doing work/chores - - - - - - Very difficult  Some recent data might be hidden    Review of Systems  Constitutional: Negative.   HENT: Negative.   Eyes: Positive for photophobia.  Respiratory: Negative.   Cardiovascular: Negative.   Gastrointestinal: Negative.   Musculoskeletal: Positive for arthralgias, back pain, gait problem and neck pain.  Skin: Negative.   Neurological: Positive for dizziness, weakness, light-headedness, numbness and headaches.  Hematological: Negative.   Psychiatric/Behavioral: Negative.        Objective:   Physical Exam Vitals and nursing note reviewed.  Musculoskeletal:     Comments: No Physical Exam Performed: My-Chart Video Visit           Assessment & Plan:  1.Lumbar pain lumbar spondylosis: Encouraged to continue toincrease Activity as tolerated.04/26/2020 Continue current medication regimen. Refilled:MS Contin 30 MG one tablet every12hours #60. BoyfriendJoe is dispensing [name] Medication she reports. We will continue the opioid monitoring program, this consists of regular clinic visits, examinations, urine drug screen, pill counts as well as use of [place] Controlled Substance Reporting system. A 12 month History has been reviewed on the [place] Controlled Substance Reporting Systemon02/12/2020. 2. Lumbar Radiculitis: Continuecurrent medication regimen withGabapentin.04/26/2020 3. Bilateral Knee Pain/ Degenerative:Continue Voltaren Gel. Continue to Monitor.04/26/2020 4. Severe diabetic poly neuropathy: Continuecurrent medication  regimen withGabapentin.04/26/2020 5. Insomnia: ContinueArmodafinilPsychiatry Following.04/26/2020 6.Anxiety/Depression:Continue Current Medication regimen as prescribed by PCP and Psychiatry.04/26/2020 7. De Quervains Tenosynovitis: No Complaints Today.04/26/2020 8.BilateralGreater Trochanteric Bursitis:Continue with Ice and Heat Therapy. Continue to Monitor.04/26/2020. 9. Cervicalgia/ Cervical Radiculitis:Continue Gabapentin.Continue with HEP as Tolerated and continue to monitor.04/26/2020 10. Chronic Bilateral Thoracic Back Pain: Continue HEP as Tolerated. Continue current medication regimen. Continue to monitor.04/26/2020.  F/U in 1 month  My-Chart Video Visit Established Patient Location of Patient: In Her Home Location of Provider: In the Office Supervising Physician: Dr Letta Pate

## 2020-05-07 ENCOUNTER — Ambulatory Visit: Payer: Medicare Other

## 2020-05-07 ENCOUNTER — Encounter: Payer: Self-pay | Admitting: Cardiology

## 2020-05-07 ENCOUNTER — Ambulatory Visit (INDEPENDENT_AMBULATORY_CARE_PROVIDER_SITE_OTHER): Payer: Medicare Other | Admitting: Cardiology

## 2020-05-07 ENCOUNTER — Other Ambulatory Visit: Payer: Self-pay | Admitting: Cardiology

## 2020-05-07 ENCOUNTER — Encounter: Payer: Self-pay | Admitting: *Deleted

## 2020-05-07 VITALS — BP 158/80 | HR 78 | Ht 64.0 in | Wt 243.0 lb

## 2020-05-07 DIAGNOSIS — I10 Essential (primary) hypertension: Secondary | ICD-10-CM | POA: Diagnosis not present

## 2020-05-07 DIAGNOSIS — I4891 Unspecified atrial fibrillation: Secondary | ICD-10-CM

## 2020-05-07 DIAGNOSIS — I5032 Chronic diastolic (congestive) heart failure: Secondary | ICD-10-CM | POA: Diagnosis not present

## 2020-05-07 DIAGNOSIS — I2510 Atherosclerotic heart disease of native coronary artery without angina pectoris: Secondary | ICD-10-CM

## 2020-05-07 DIAGNOSIS — Z79899 Other long term (current) drug therapy: Secondary | ICD-10-CM

## 2020-05-07 MED ORDER — FUROSEMIDE 40 MG PO TABS: 40.0000 mg | ORAL_TABLET | Freq: Every day | ORAL | 3 refills | Status: DC

## 2020-05-07 NOTE — Patient Instructions (Addendum)
Medication Instructions:   Your physician has recommended you make the following change in your medication:   Increase furosemide to 40 mg daily  Continue other medications the same  Labwork:  Your physician recommends that you return for lab work in: 2 weeks to check your BMET & Mg levels. This may be done at [facility].  Testing/Procedures: Your physician has recommended that you wear an event monitor for 30 days. Event monitors are medical devices that record the heart's electrical activity. Doctors most often [place] these monitors to diagnose arrhythmias. Arrhythmias are problems with the speed or rhythm of the heartbeat. The monitor is a small, portable device. You can wear one while you do your normal daily activities. This is usually used to diagnose what is causing palpitations/syncope (passing out).  Follow-Up:  Your physician recommends that you schedule a follow-up appointment in: 3 months.   Any Other Special Instructions Will Be Listed Below (If Applicable).  If you need a refill on your cardiac medications before your next appointment, please call your pharmacy.

## 2020-05-07 NOTE — Progress Notes (Signed)
Clinical Summary [name] is a 70 y.o.female former patient of Dr Bronson Ing, this is our first visit together.  1. CAD -history of MI in 2007, had PCI to LCX - no exertional symptoms - compliant with meds   2. Chronic diastolic HF - some recent LE edema - compliant with lasix  3. OSA   4. Afib - 10/2019 admitted to [facility] - UTI with sepsis, had some afib during that admission. No prior history.  - was started on eliquis at the time but Rx ran out, no longer taking.   5. HTN - home bps 120s/80s   Past Medical History:  Diagnosis Date  . Anxiety and depression   . Arthritis   . CAD (coronary artery disease)    Stent placement circumflex coronary 2007, catheterization 2008 patent stents. Normal LV function  . Chest pain   . CHF (congestive heart failure) ([place])   . COPD (chronic obstructive pulmonary disease) (HCC)    no home O2  . Depression   . Diabetes mellitus    Insulin dependent  . Diabetic polyneuropathy (HCC)     severe on multiple medications  . Dyslipidemia   . GERD (gastroesophageal reflux disease)   . Headache(784.0)   . Heart attack ([place])   . Heartburn   . 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 High cholesterol   . Hx of blood clots   . Hypertension   . MI (myocardial infarction) ([place])    2007  . Obstructive sleep apnea    CPAP, setting ?2  . Palpitations   . Sleep apnea   . Tachycardia      Allergies  Allergen Reactions  . Amphetamine-Dextroamphetamine Swelling  . Nitrofuran Derivatives Itching and Swelling  . Amphetamine-Dextroamphet Er Swelling  . Pregabalin Swelling  . Topiramate Other (See Comments)    Tongue tingle  . Verelan [Verapamil] Rash     Current Outpatient Medications  Medication Sig Dispense Refill  . ALPRAZolam (XANAX) 0.5 MG tablet Take 0.5 mg by mouth 2 (two) times daily as needed.    . Armodafinil 150 MG tablet Take 150 mg by mouth daily.    Marland Kitchen aspirin EC 81 MG tablet Take 1 tablet (81 mg total) by mouth  daily.    Marland Kitchen atorvastatin (LIPITOR) 40 MG tablet Take 40 mg by mouth daily.    . B-D ULTRAFINE III SHORT PEN 31G X 8 MM MISC Inject into the skin 4 (four) times daily.    . Cholecalciferol (VITAMIN D3) 125 MCG (5000 UT) CAPS TAKE ONE CAPSULE BY MOUTH DAILY (Patient taking differently: Take 5,000 Units by mouth daily.) 90 capsule 0  . clotrimazole-betamethasone (LOTRISONE) cream Apply to affected area 2 times daily for 21 days 15 g 1  . diclofenac Sodium (VOLTAREN) 1 % GEL APPLY TO BOTH KNEES THREE TIMES DAILY 300 g 2  . diltiazem (CARDIZEM CD) 240 MG 24 hr capsule Take 240 mg by mouth daily.    Marland Kitchen doxepin (SINEQUAN) 25 MG capsule Take 75 mg by mouth at bedtime.     Marland Kitchen ELIQUIS 5 MG TABS tablet Take 5 mg by mouth in the morning and at bedtime.    Marland Kitchen estradiol (ESTRACE) 0.5 MG tablet Take 0.5 mg by mouth daily.     . FEROSUL 325 (65 Fe) MG tablet Take 325 mg by mouth every morning.    . furosemide (LASIX) 20 MG tablet Take 20 mg by mouth daily.     Marland Kitchen gabapentin (NEURONTIN) 400  MG capsule Take 400 mg by mouth 3 (three) times daily.  5  . glipiZIDE (GLUCOTROL XL) 5 MG 24 hr tablet TAKE 1 TABLET BY MOUTH EVERY DAY WITH BREAKFAST (Patient taking differently: Take 5 mg by mouth daily with breakfast.) 30 tablet 3  . lisinopril (ZESTRIL) 5 MG tablet Take 1 tablet (5 mg total) by mouth daily. 90 tablet 2  . meloxicam (MOBIC) 7.5 MG tablet Take 7.5 mg by mouth in the morning and at bedtime.   4  . metFORMIN (GLUCOPHAGE) 500 MG tablet TAKE 1 TABLET BY MOUTH TWICE DAILY (Patient taking differently: Take 500 mg by mouth 2 (two) times daily with a meal.) 60 tablet 3  . metoCLOPramide (REGLAN) 5 MG tablet Take 5 mg by mouth 3 (three) times daily as needed for nausea or vomiting.   1  . metoprolol succinate (TOPROL-XL) 100 MG 24 hr tablet Take 100 mg by mouth daily. Take with or immediately following a meal.    . montelukast (SINGULAIR) 10 MG tablet Take 10 mg by mouth at bedtime.     Marland Kitchen morphine (MS CONTIN) 30 MG 12  hr tablet Take 1 tablet (30 mg total) by mouth every 12 (twelve) hours. 60 tablet 0  . nitroGLYCERIN (NITROSTAT) 0.4 MG SL tablet Place 1 tablet (0.4 mg total) under the tongue every 5 (five) minutes x 3 doses as needed. For chest pains 25 tablet 3  . NOVOLOG FLEXPEN 100 UNIT/ML FlexPen Inject into the skin.    Marland Kitchen ondansetron (ZOFRAN) 4 MG tablet Take 4 mg by mouth every 6 (six) hours as needed for nausea or vomiting.    . pantoprazole (PROTONIX) 40 MG tablet TAKE 1 TABLET BY MOUTH TWICE DAILY BEFORE MEALS (Patient taking differently: Take 40 mg by mouth 2 (two) times daily before a meal.) 60 tablet 1  . polyethylene glycol powder (GLYCOLAX/MIRALAX) 17 GM/SCOOP powder Take 1 Container by mouth daily as needed for moderate constipation.     . potassium chloride (K-DUR,KLOR-CON) 10 MEQ tablet Take 10 mEq by mouth 2 (two) times daily.  12  . PROAIR HFA 108 (90 Base) MCG/ACT inhaler Inhale 2 puffs into the lungs every 4 (four) hours as needed for wheezing or shortness of breath.   8  . promethazine (PHENERGAN) 25 MG tablet Take 1 tablet (25 mg total) by mouth 4 (four) times daily as needed. 30 tablet 0  . sucralfate (CARAFATE) 1 g tablet Take 1 g by mouth 4 (four) times daily.    Marland Kitchen topiramate (TOPAMAX) 200 MG tablet Take 200 mg by mouth at bedtime.  3  . TRESIBA FLEXTOUCH 100 UNIT/ML FlexTouch Pen Inject 70 Units into the skin at bedtime. 30 mL 1   No current facility-administered medications for this visit.     Past Surgical History:  Procedure Laterality Date  . ABDOMINAL HYSTERECTOMY    . Arm Surgery Bilateral    due to fall-pt broke both forearms  . BIOPSY N/A 07/01/2013   Procedure: BIOPSY;  Surgeon: Rogene Houston, MD;  Location: [facility];  Service: Endoscopy;  Laterality: N/A;  . CHOLECYSTECTOMY    . COLONOSCOPY  08/06/2011   Procedure: COLONOSCOPY;  Surgeon: Rogene Houston, MD;  [facility];  Service: Endoscopy;  Laterality: N/A;  215  . COLONOSCOPY WITH PROPOFOL N/A  12/31/2012   Procedure: COLONOSCOPY WITH PROPOFOL;  Surgeon: Rogene Houston, MD;  Location: [facility];  Service: Endoscopy;  Laterality: N/A;  in cecum at 0814, total withdrawal time 18mn  .  COLONOSCOPY WITH PROPOFOL N/A 02/05/2018   Procedure: COLONOSCOPY WITH PROPOFOL;  Surgeon: Rogene Houston, MD;  [facility];  Service: Endoscopy;  Laterality: N/A;  1:25  . CORONARY ANGIOPLASTY WITH STENT PLACEMENT    . CYSTOSCOPY W/ URETERAL STENT PLACEMENT Right 11/10/2019   at [facility]  . CYSTOSCOPY WITH RETROGRADE PYELOGRAM, URETEROSCOPY AND STENT PLACEMENT Right 12/22/2019   Procedure: CYSTOSCOPY WITH RIGHT URETERAL STENT REMOVAL; RIGHT RETROGRADE PYELOGRAM,RIGHT URETEROSCOPY;  Surgeon: Cleon Gustin, MD;  Location: [facility];  Service: Urology;  Laterality: Right;  . ESOPHAGEAL DILATION N/A 06/07/2015   Procedure: ESOPHAGEAL DILATION;  Surgeon: Rogene Houston, MD;  [facility];  Service: Endoscopy;  Laterality: N/A;  . ESOPHAGOGASTRODUODENOSCOPY N/A 06/07/2015   Procedure: ESOPHAGOGASTRODUODENOSCOPY (EGD);  Surgeon: Rogene Houston, MD;  [facility];  Service: Endoscopy;  Laterality: N/A;  2:45 - moved to 1:00 - Ann to notify pt  . ESOPHAGOGASTRODUODENOSCOPY (EGD) WITH PROPOFOL N/A 12/31/2012   Procedure: ESOPHAGOGASTRODUODENOSCOPY (EGD) WITH PROPOFOL;  Surgeon: Rogene Houston, MD;  Location: [facility];  Service: Endoscopy;  Laterality: N/A;  GE junction 37,  . ESOPHAGOGASTRODUODENOSCOPY (EGD) WITH PROPOFOL N/A 07/01/2013   Procedure: ESOPHAGOGASTRODUODENOSCOPY (EGD) WITH PROPOFOL;  Surgeon: Rogene Houston, MD;  Location: [facility];  Service: Endoscopy;  Laterality: N/A;  950  . MALONEY DILATION N/A 12/31/2012   Procedure: MALONEY DILATION;  Surgeon: Rogene Houston, MD;  Location: [facility];  Service: Endoscopy;  Laterality: N/A;  used a # 54,#56  . MALONEY DILATION N/A 07/01/2013   Procedure: Venia Minks DILATION;  Surgeon: Rogene Houston, MD;  Location: [facility];  Service:  Endoscopy;  Laterality: N/A;  54/58; no heme present  . POLYPECTOMY  02/05/2018   Procedure: POLYPECTOMY;  Surgeon: Rogene Houston, MD;  [facility];  Service: Endoscopy;;  distal rectum (HS)  . STONE EXTRACTION WITH BASKET Right 12/22/2019   Procedure: STONE EXTRACTION WITH BASKET;  Surgeon: Cleon Gustin, MD;  Location: [facility];  Service: Urology;  Laterality: Right;     Allergies  Allergen Reactions  . Amphetamine-Dextroamphetamine Swelling  . Nitrofuran Derivatives Itching and Swelling  . Amphetamine-Dextroamphet Er Swelling  . Pregabalin Swelling  . Topiramate Other (See Comments)    Tongue tingle  . Verelan [Verapamil] Rash      Family History  Problem Relation Age of Onset  . Heart attack Mother   . Colon cancer Mother   . Heart attack Father   . Stroke Sister   . Hemophilia Child   . Cancer Other   . Coronary artery disease Other   . Cancer Brother   . Cancer Maternal Aunt   . Cancer Maternal Uncle   . Cancer Paternal Aunt   . Cancer Paternal Uncle   . Cancer Brother      Social History Ms. Livesay reports that she has never smoked. She has never used smokeless tobacco. Ms. Mckeon reports no history of alcohol use.   Review of Systems CONSTITUTIONAL: No weight loss, fever, chills, weakness or fatigue.  HEENT: Eyes: No visual loss, blurred vision, double vision or yellow sclerae.No hearing loss, sneezing, congestion, runny nose or sore throat.  SKIN: No rash or itching.  CARDIOVASCULAR: per hpi RESPIRATORY: No shortness of breath, cough or sputum.  GASTROINTESTINAL: No anorexia, nausea, vomiting or diarrhea. No abdominal pain or blood.  GENITOURINARY: No burning on urination, no polyuria NEUROLOGICAL: No headache, dizziness, syncope, paralysis, ataxia, numbness or tingling in the extremities. No change in  bowel or bladder control.  MUSCULOSKELETAL: No muscle, back pain, joint pain or stiffness.  LYMPHATICS: No enlarged nodes. No history of  splenectomy.  PSYCHIATRIC: No history of depression or anxiety.  ENDOCRINOLOGIC: No reports of sweating, cold or heat intolerance. No polyuria or polydipsia.  Marland Kitchen   Physical Examination Today's Vitals   05/07/20 1537  BP: (!) 158/80  Pulse: 78  SpO2: 97%  Weight: 243 lb (110.2 kg)  Height: 5' 4"  (1.626 m)   Body mass index is 41.71 kg/m.  Gen: resting comfortably, no acute distress HEENT: no scleral icterus, pupils equal round and reactive, no palptable cervical adenopathy,  CV: RRR, no m/r/g no jvd Resp: Clear to auscultation bilaterally GI: abdomen is soft, non-tender, non-distended, normal bowel sounds, no hepatosplenomegaly MSK: extremities are warm, 1+ bilateral LE edema Skin: warm, no rash Neuro:  no focal deficits Psych: appropriate affect   Diagnostic Studies  Echocardiogram 04/05/2019 ([place]):  Summary  1. The left ventricle is normal in size with mildly increased wall thickness.  2. The left ventricular systolic function is normal, LVEF is visually estimated at > 55%. Grade 2 diastolic dysfunction.  3. The left atrium is mildly dilated in size.  4. The right ventricle is mildly dilated in size, with normal systolic function.  5. The right atrium is mildly dilated in size.   08/2016 nuclear stress  There was no ST segment deviation noted during stress.  The study is normal.  This is a low risk study.  Nuclear stress EF: 68%.   Assessment and Plan  1. CAD - no symptoms, continue current meds  2. Afib - from notes episodes during admision at [facility] with sepsis - unclear if isolated episode in setting of systemic stress or if could be an ongoing issue. She was temporarily on eliquis but Rx ran out.  - EKG today shows NSR - obtain 30 day event monitor to evaluate for any recurrent afib, if present would need to restart eliquis.   3. HTN - elevated in clinic but home numbers at goal, continue current meds  4. Chronic diastolic  HF - some recent edema. Increase lasix to 23m daily, check BMET/Mg in 2 weeks.     JArnoldo Lenis M.D.

## 2020-05-08 ENCOUNTER — Ambulatory Visit: Payer: Medicare Other | Admitting: Cardiology

## 2020-05-08 NOTE — Addendum Note (Signed)
Addended by: Merlene Laughter on: 05/08/2020 07:31 AM   Modules accepted: Orders

## 2020-05-10 ENCOUNTER — Encounter (INDEPENDENT_AMBULATORY_CARE_PROVIDER_SITE_OTHER): Payer: Medicare Other

## 2020-05-10 DIAGNOSIS — I4891 Unspecified atrial fibrillation: Secondary | ICD-10-CM

## 2020-05-21 ENCOUNTER — Telehealth: Payer: Self-pay | Admitting: *Deleted

## 2020-05-21 NOTE — Telephone Encounter (Signed)
Return Ms. Gjerde call, she had a video visit last month. She was instructed she will need to be seen this month, she verbalizes understanding. She states she will keep her scheduled appointment

## 2020-05-21 NOTE — Telephone Encounter (Signed)
[name] called and is asking for [name] to call her about her appt tomorrow.  I spoke with her and she is wanting a video visit.

## 2020-05-22 ENCOUNTER — Encounter: Payer: Self-pay | Admitting: Registered Nurse

## 2020-05-22 ENCOUNTER — Encounter: Payer: Medicare Other | Attending: Registered Nurse | Admitting: Registered Nurse

## 2020-05-22 ENCOUNTER — Other Ambulatory Visit: Payer: Self-pay

## 2020-05-22 VITALS — BP 117/69 | HR 75 | Temp 98.9°F | Ht 64.0 in | Wt 249.0 lb

## 2020-05-22 DIAGNOSIS — M5412 Radiculopathy, cervical region: Secondary | ICD-10-CM | POA: Diagnosis present

## 2020-05-22 DIAGNOSIS — E1140 Type 2 diabetes mellitus with diabetic neuropathy, unspecified: Secondary | ICD-10-CM | POA: Insufficient documentation

## 2020-05-22 DIAGNOSIS — M7061 Trochanteric bursitis, right hip: Secondary | ICD-10-CM | POA: Diagnosis present

## 2020-05-22 DIAGNOSIS — M1711 Unilateral primary osteoarthritis, right knee: Secondary | ICD-10-CM | POA: Diagnosis present

## 2020-05-22 DIAGNOSIS — Z5181 Encounter for therapeutic drug level monitoring: Secondary | ICD-10-CM | POA: Insufficient documentation

## 2020-05-22 DIAGNOSIS — G894 Chronic pain syndrome: Secondary | ICD-10-CM | POA: Diagnosis present

## 2020-05-22 DIAGNOSIS — M1712 Unilateral primary osteoarthritis, left knee: Secondary | ICD-10-CM | POA: Insufficient documentation

## 2020-05-22 DIAGNOSIS — M48062 Spinal stenosis, lumbar region with neurogenic claudication: Secondary | ICD-10-CM | POA: Insufficient documentation

## 2020-05-22 DIAGNOSIS — M5416 Radiculopathy, lumbar region: Secondary | ICD-10-CM | POA: Diagnosis present

## 2020-05-22 DIAGNOSIS — I2510 Atherosclerotic heart disease of native coronary artery without angina pectoris: Secondary | ICD-10-CM

## 2020-05-22 DIAGNOSIS — M546 Pain in thoracic spine: Secondary | ICD-10-CM | POA: Insufficient documentation

## 2020-05-22 DIAGNOSIS — Z79891 Long term (current) use of opiate analgesic: Secondary | ICD-10-CM | POA: Insufficient documentation

## 2020-05-22 DIAGNOSIS — M7062 Trochanteric bursitis, left hip: Secondary | ICD-10-CM | POA: Insufficient documentation

## 2020-05-22 DIAGNOSIS — M542 Cervicalgia: Secondary | ICD-10-CM | POA: Diagnosis present

## 2020-05-22 MED ORDER — MORPHINE SULFATE ER 30 MG PO TBCR
30.0000 mg | EXTENDED_RELEASE_TABLET | Freq: Two times a day (BID) | ORAL | 0 refills | Status: DC
Start: 2020-05-22 — End: 2020-06-20

## 2020-05-22 NOTE — Progress Notes (Signed)
Subjective:    Patient ID: [name], female    DOB: Mar 01, 1951, 70 y.o.   MRN: [id number]  HPI: [name] is a 70 y.o. female who returns for follow up appointment for chronic pain and medication refill. She states her  pain is located in her neck radiating into her bilateral shoulders, mid- lower back pain radiating into her bilateral hips and bilateral lower extremities. Also reports bilateral knee pain. She rates her pain 7. Her  current exercise regime is walking and performing stretching exercises.  [name] Morphine equivalent is 60.00 MME.  UDS ordered today.    Pain Inventory Average Pain 7 Pain Right Now 7 My pain is sharp, burning, dull, stabbing, tingling and aching  In the last 24 hours, has pain interfered with the following? General activity 3 Relation with others 0 Enjoyment of life 4 What TIME of day is your pain at its worst? morning  and night Sleep (in general) Poor  Pain is worse with: walking, bending and standing Pain improves with: medication Relief from Meds: 3  Family History  Problem Relation Age of Onset  . Heart attack Mother   . Colon cancer Mother   . Heart attack Father   . Stroke Sister   . Hemophilia Child   . Cancer Other   . Coronary artery disease Other   . Cancer Brother   . Cancer Maternal Aunt   . Cancer Maternal Uncle   . Cancer Paternal Aunt   . Cancer Paternal Uncle   . Cancer Brother    Social History   Socioeconomic History  . Marital status: Divorced    Spouse name: Not on file  . Number of children: 4  . Years of education: Not on file  . Highest education level: Not on file  Occupational History  . Occupation: Disabled  Tobacco Use  . Smoking status: Never Smoker  . Smokeless tobacco: Never Used  Vaping Use  . Vaping Use: Never used  Substance and Sexual Activity  . Alcohol use: No    Alcohol/week: 0.0 standard drinks  . Drug use: No  . Sexual activity: Not on file  Other Topics Concern  . Not  on file  Social History Narrative   Patient does not get regular exercise   Denies caffeine use    Social Determinants of Health   Financial Resource Strain: Not on file  Food Insecurity: Not on file  Transportation Needs: Not on file  Physical Activity: Not on file  Stress: Not on file  Social Connections: Not on file   Past Surgical History:  Procedure Laterality Date  . ABDOMINAL HYSTERECTOMY    . Arm Surgery Bilateral    due to fall-pt broke both forearms  . BIOPSY N/A 07/01/2013   Procedure: BIOPSY;  Surgeon: Rogene Houston, MD;  [facility]: [facility];  Service: Endoscopy;  Laterality: N/A;  . CHOLECYSTECTOMY    . COLONOSCOPY  08/06/2011   Procedure: COLONOSCOPY;  Surgeon: Rogene Houston, MD;  [facility];  Service: Endoscopy;  Laterality: N/A;  215  . COLONOSCOPY WITH PROPOFOL N/A 12/31/2012   Procedure: COLONOSCOPY WITH PROPOFOL;  Surgeon: Rogene Houston, MD;  Location: [facility];  Service: Endoscopy;  Laterality: N/A;  in cecum at 0814, total withdrawal time 55mn  . COLONOSCOPY WITH PROPOFOL N/A 02/05/2018   Procedure: COLONOSCOPY WITH PROPOFOL;  Surgeon: RRogene Houston MD;  [facility];  Service: Endoscopy;  Laterality: N/A;  1:25  .  CORONARY ANGIOPLASTY WITH STENT PLACEMENT    . CYSTOSCOPY W/ URETERAL STENT PLACEMENT Right 11/10/2019   at [facility]  . CYSTOSCOPY WITH RETROGRADE PYELOGRAM, URETEROSCOPY AND STENT PLACEMENT Right 12/22/2019   Procedure: CYSTOSCOPY WITH RIGHT URETERAL STENT REMOVAL; RIGHT RETROGRADE PYELOGRAM,RIGHT URETEROSCOPY;  Surgeon: Cleon Gustin, MD;  [facility]: [facility];  Service: Urology;  Laterality: Right;  . ESOPHAGEAL DILATION N/A 06/07/2015   Procedure: ESOPHAGEAL DILATION;  Surgeon: Rogene Houston, MD;  [facility];  Service: Endoscopy;  Laterality: N/A;  . ESOPHAGOGASTRODUODENOSCOPY N/A 06/07/2015   Procedure: ESOPHAGOGASTRODUODENOSCOPY (EGD);  Surgeon: Rogene Houston, MD;  [facility];  Service:  Endoscopy;  Laterality: N/A;  2:45 - moved to 1:00 - Ann to notify pt  . ESOPHAGOGASTRODUODENOSCOPY (EGD) WITH PROPOFOL N/A 12/31/2012   Procedure: ESOPHAGOGASTRODUODENOSCOPY (EGD) WITH PROPOFOL;  Surgeon: Rogene Houston, MD;  [facility];  Service: Endoscopy;  Laterality: N/A;  GE junction 37,  . ESOPHAGOGASTRODUODENOSCOPY (EGD) WITH PROPOFOL N/A 07/01/2013   Procedure: ESOPHAGOGASTRODUODENOSCOPY (EGD) WITH PROPOFOL;  Surgeon: Rogene Houston, MD;  [facility];  Service: Endoscopy;  Laterality: N/A;  950  . MALONEY DILATION N/A 12/31/2012   Procedure: MALONEY DILATION;  Surgeon: Rogene Houston, MD;  [facility];  Service: Endoscopy;  Laterality: N/A;  used a # 54,#56  . MALONEY DILATION N/A 07/01/2013   Procedure: Venia Minks DILATION;  Surgeon: Rogene Houston, MD;  [facility];  Service: Endoscopy;  Laterality: N/A;  54/58; no heme present  . POLYPECTOMY  02/05/2018   Procedure: POLYPECTOMY;  Surgeon: Rogene Houston, MD;  [facility];  Service: Endoscopy;;  distal rectum (HS)  . STONE EXTRACTION WITH BASKET Right 12/22/2019   Procedure: STONE EXTRACTION WITH BASKET;  Surgeon: Cleon Gustin, MD;  [facility];  Service: Urology;  Laterality: Right;   Past Surgical History:  Procedure Laterality Date  . ABDOMINAL HYSTERECTOMY    . Arm Surgery Bilateral    due to fall-pt broke both forearms  . BIOPSY N/A 07/01/2013   Procedure: BIOPSY;  Surgeon: Rogene Houston, MD;  [facility];  Service: Endoscopy;  Laterality: N/A;  . CHOLECYSTECTOMY    . COLONOSCOPY  08/06/2011   Procedure: COLONOSCOPY;  Surgeon: Rogene Houston, MD;  [facility];  Service: Endoscopy;  Laterality: N/A;  215  . COLONOSCOPY WITH PROPOFOL N/A 12/31/2012   Procedure: COLONOSCOPY WITH PROPOFOL;  Surgeon: Rogene Houston, MD;  [facility];  Service: Endoscopy;  Laterality: N/A;  in cecum at 0814, total withdrawal time 79mn  . COLONOSCOPY WITH PROPOFOL N/A 02/05/2018    Procedure: COLONOSCOPY WITH PROPOFOL;  Surgeon: RRogene Houston MD;  [facility];  Service: Endoscopy;  Laterality: N/A;  1:25  . CORONARY ANGIOPLASTY WITH STENT PLACEMENT    . CYSTOSCOPY W/ URETERAL STENT PLACEMENT [facility]2019   [facility] . CYSTOSCOPY WITH RETROGRADE PYELOGRAM, URETEROSCOPY AND STENT PLACEMENT Right 12/22/2019   Procedure: CYSTOSCOPY WITH RIGHT URETERAL STENT REMOVAL; RIGHT RETROGRADE PYELOGRAM,RIGHT URETEROSCOPY;  Surgeon: MCleon Gustin MD;  [facility];  Service: Urology;  Laterality: Right;  . ESOPHAGEAL DILATION N/A 06/07/2015   Procedure: ESOPHAGEAL DILATION;  Surgeon: NRogene Houston MD;  [facility];  Service: Endoscopy;  Laterality: N/A;  . ESOPHAGOGASTRODUODENOSCOPY N/A 06/07/2015   Procedure: ESOPHAGOGASTRODUODENOSCOPY (EGD);  Surgeon: NRogene Houston MD;  [facility];  Service: Endoscopy;  Laterality: N/A;  2:45 - moved to 1:00 -  Ann to notify pt  . ESOPHAGOGASTRODUODENOSCOPY (EGD) WITH PROPOFOL N/A 12/31/2012   Procedure: ESOPHAGOGASTRODUODENOSCOPY (EGD) WITH PROPOFOL;  Surgeon: Rogene Houston, MD;  [facility];  Service: Endoscopy;  Laterality: N/A;  GE junction 37,  . ESOPHAGOGASTRODUODENOSCOPY (EGD) WITH PROPOFOL N/A 07/01/2013   Procedure: ESOPHAGOGASTRODUODENOSCOPY (EGD) WITH PROPOFOL;  Surgeon: Rogene Houston, MD;  [facility];  Service: Endoscopy;  Laterality: N/A;  950  . MALONEY DILATION N/A 12/31/2012   Procedure: MALONEY DILATION;  Surgeon: Rogene Houston, MD;  [facility];  Service: Endoscopy;  Laterality: N/A;  used a # 54,#56  . MALONEY DILATION N/A 07/01/2013   Procedure: Venia Minks DILATION;  Surgeon: Rogene Houston, MD;  [facility];  Service: Endoscopy;  Laterality: N/A;  54/58; no heme present  . POLYPECTOMY  02/05/2018   Procedure: POLYPECTOMY;  Surgeon: Rogene Houston, MD;  [facility];  Service: Endoscopy;;  distal rectum (HS)  . STONE EXTRACTION WITH BASKET  Right 12/22/2019   Procedure: STONE EXTRACTION WITH BASKET;  Surgeon: Cleon Gustin, MD;  Location: [facility];  Service: Urology;  Laterality: Right;   Past Medical History:  Diagnosis Date  . Anxiety and depression   . Arthritis   . CAD (coronary artery disease)    Stent placement circumflex coronary 2007, catheterization 2008 patent stents. Normal LV function  . Chest pain   . CHF (congestive heart failure) ([place])   . COPD (chronic obstructive pulmonary disease) ([facility])    no home O2  . Depression   . Diabetes mellitus    Insulin dependent  . Diabetic polyneuropathy ([facility])     severe on multiple medications  . Dyslipidemia   . GERD (gastroesophageal reflux disease)   . Headache(784.0)   . Heart attack ([place])   . Heartburn   . 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 High cholesterol   . Hx of blood clots   . Hypertension   . MI (myocardial infarction) ([place])    2007  . Obstructive sleep apnea    CPAP, setting ?2  . Palpitations   . Sleep apnea   . Tachycardia    There were no vitals taken for this visit.  Opioid Risk Score:   Fall Risk Score:  `1  Depression screen PHQ 2/9  Depression screen [facility] 2/9 01/24/2020 11/18/2019 10/26/2019 09/26/2019 03/24/2019 07/16/2018 06/12/2017  Decreased Interest 0 1 0 0 1 1 3   Down, Depressed, Hopeless 0 1 0 0 1 1 3   PHQ - 2 Score 0 2 0 0 2 2 6   Altered sleeping - - - - - - 3  Tired, decreased energy - - - - - - 3  Change in appetite - - - - - - 0  Feeling bad or failure about yourself  - - - - - - 3  Trouble concentrating - - - - - - 3  Moving slowly or fidgety/restless - - - - - - 1  Suicidal thoughts - - - - - - 0  PHQ-9 Score - - - - - - 19  Difficult doing work/chores - - - - - - Very difficult  Some recent data might be hidden    Review of Systems  Constitutional: Negative.   HENT: Negative.   Eyes: Negative.   Respiratory: Negative.   Cardiovascular: Negative.   Gastrointestinal: Negative.   Endocrine: Negative.   Musculoskeletal: Positive  for arthralgias, back pain and gait problem.  Allergic/Immunologic: Negative.   Hematological: Negative.  Psychiatric/Behavioral: Negative.   All other systems reviewed and are negative.      Objective:   Physical Exam Vitals and nursing note reviewed.  Constitutional:      Appearance: Normal appearance.  Cardiovascular:     Rate and Rhythm: Normal rate and regular rhythm.     Pulses: Normal pulses.     Heart sounds: Normal heart sounds.  Pulmonary:     Effort: Pulmonary effort is normal.     Breath sounds: Normal breath sounds.  Musculoskeletal:     Cervical back: Normal range of motion and neck supple.     Comments: Normal Muscle Bulk and Muscle Testing Reveals:  Upper Extremities: Full ROM and Muscle Strength 5/5 Bilateral AC Joint Tenderness  Thoracic and  Lumbar Hypersensitivity Bilateral Greater Trochanter Tenderness Lower Extremities: Decreased ROM and Muscle Strength 5/5 Arrived in wheelchair   Skin:    General: Skin is warm and dry.  Neurological:     Mental Status: She is alert and oriented to person, place, and time.  Psychiatric:        Mood and Affect: Mood normal.        Behavior: Behavior normal.           Assessment & Plan:  1.Lumbar pain lumbar spondylosis: Encouraged to continue toincrease Activity as tolerated.05/22/2020 Continue current medication regimen. Refilled:MS Contin 30 MG one tablet every12hours #60. BoyfriendJoe is dispensing Ms. Eland Medication she reports. We will continue the opioid monitoring program, this consists of regular clinic visits, examinations, urine drug screen, pill counts as well as use of [place] Controlled Substance Reporting system. A 12 month History has been reviewed on the [place] Controlled Substance Reporting Systemon03/10/2020. 2. Lumbar Radiculitis: Continuecurrent medication regimen withGabapentin.05/22/2020 3. Bilateral Knee Pain/ Degenerative:Continue Voltaren Gel. Continue to  Monitor.05/22/2020 4. Severe diabetic poly neuropathy: Continuecurrent medication regimen withGabapentin.05/22/2020 5. Insomnia: ContinueArmodafinilPsychiatry Following.05/22/2020 6.Anxiety/Depression:Continue Current Medication regimen as prescribed byPCP andPsychiatry.05/22/2020 7. De Quervains Tenosynovitis: No Complaints Today.05/22/2020 8.BilateralGreater Trochanteric Bursitis:Continue with Ice and Heat Therapy. Continue to Monitor.05/22/2020. 9. Cervicalgia/ Cervical Radiculitis:Continue Gabapentin.Continue with HEP as Tolerated and continue to monitor.05/22/2020 10. Chronic Bilateral Thoracic Back Pain: Continue HEP as Tolerated. Continue current medication regimen. Continue to monitor.05/22/2020.  F/U in 1 month

## 2020-05-24 ENCOUNTER — Other Ambulatory Visit: Payer: Self-pay | Admitting: "Endocrinology

## 2020-05-24 DIAGNOSIS — E1159 Type 2 diabetes mellitus with other circulatory complications: Secondary | ICD-10-CM

## 2020-05-30 ENCOUNTER — Telehealth: Payer: Self-pay | Admitting: *Deleted

## 2020-05-30 LAB — TOXASSURE SELECT,+ANTIDEPR,UR

## 2020-05-30 NOTE — Telephone Encounter (Signed)
Urine drug screen for this encounter is consistent for prescribed medication 

## 2020-06-11 ENCOUNTER — Telehealth: Payer: Self-pay

## 2020-06-11 ENCOUNTER — Other Ambulatory Visit: Payer: Self-pay | Admitting: "Endocrinology

## 2020-06-11 DIAGNOSIS — E1159 Type 2 diabetes mellitus with other circulatory complications: Secondary | ICD-10-CM

## 2020-06-11 MED ORDER — TRESIBA FLEXTOUCH 100 UNIT/ML ~~LOC~~ SOPN: 80.0000 [IU] | PEN_INJECTOR | Freq: Every day | SUBCUTANEOUS | 0 refills | Status: DC

## 2020-06-11 MED ORDER — TRESIBA FLEXTOUCH 100 UNIT/ML ~~LOC~~ SOPN: 70.0000 [IU] | PEN_INJECTOR | Freq: Every day | SUBCUTANEOUS | 0 refills | Status: DC

## 2020-06-11 NOTE — Telephone Encounter (Signed)
Laurey Arrow at [facility] called and said that the patient had been taking 80 units of Tresbia and she is now out and can not be filled again until the 31st. Dr Dorris Fetch said he would send in a new RX for her. I called and let Laurey Arrow know at [facility] that the script will say 80 units but patient is to ONLY take 70 units per Dr Dorris Fetch. He will make patient aware & I will call her as well.

## 2020-06-11 NOTE — Telephone Encounter (Signed)
Called and spoke with patient-she said she knows that she was doing wrong. She is going to the pharmacy to get her new script.

## 2020-06-12 ENCOUNTER — Other Ambulatory Visit: Payer: Self-pay | Admitting: *Deleted

## 2020-06-12 MED ORDER — LISINOPRIL 5 MG PO TABS: 5.0000 mg | ORAL_TABLET | Freq: Every day | ORAL | 2 refills | Status: DC

## 2020-06-20 ENCOUNTER — Telehealth: Payer: Self-pay | Admitting: Registered Nurse

## 2020-06-20 MED ORDER — MORPHINE SULFATE ER 30 MG PO TBCR: 30.0000 mg | EXTENDED_RELEASE_TABLET | Freq: Two times a day (BID) | ORAL | 0 refills | Status: DC

## 2020-06-20 NOTE — Telephone Encounter (Signed)
PMP was Reviewed. MS Contin: Last Fill Date was on 05/23/2020. Ms. Eastburn called the office reporting she is ill, she has a scheduled appointment with her PCP. Her appointment was changed to next week. MS Contin e-scribed today, she verbalizes understanding.

## 2020-06-21 ENCOUNTER — Encounter: Payer: Medicare Other | Admitting: Registered Nurse

## 2020-06-27 ENCOUNTER — Encounter: Payer: Medicare Other | Admitting: Registered Nurse

## 2020-07-05 ENCOUNTER — Encounter: Payer: Medicare Other | Admitting: Registered Nurse

## 2020-07-06 LAB — COMPREHENSIVE METABOLIC PANEL
ALT: 17 IU/L (ref 0–32)
AST: 14 IU/L (ref 0–40)
Albumin/Globulin Ratio: 1.2 (ref 1.2–2.2)
Albumin: 3.6 g/dL — ABNORMAL LOW (ref 3.8–4.8)
Alkaline Phosphatase: 111 IU/L (ref 44–121)
BUN/Creatinine Ratio: 25 (ref 12–28)
BUN: 16 mg/dL (ref 8–27)
Bilirubin Total: 0.4 mg/dL (ref 0.0–1.2)
CO2: 23 mmol/L (ref 20–29)
Calcium: 9.4 mg/dL (ref 8.7–10.3)
Chloride: 100 mmol/L (ref 96–106)
Creatinine, Ser: 0.64 mg/dL (ref 0.57–1.00)
Globulin, Total: 2.9 g/dL (ref 1.5–4.5)
Glucose: 223 mg/dL — ABNORMAL HIGH (ref 65–99)
Potassium: 4.9 mmol/L (ref 3.5–5.2)
Sodium: 139 mmol/L (ref 134–144)
Total Protein: 6.5 g/dL (ref 6.0–8.5)
eGFR: 97 mL/min/{1.73_m2} (ref >59)

## 2020-07-06 LAB — T4, FREE: Free T4: 1.36 ng/dL (ref 0.82–1.77)

## 2020-07-06 LAB — MAGNESIUM: Magnesium: 1.7 mg/dL (ref 1.6–2.3)

## 2020-07-06 LAB — TSH: TSH: 2.91 u[IU]/mL (ref 0.450–4.500)

## 2020-07-06 LAB — LIPID PANEL
Chol/HDL Ratio: 2.8 ratio (ref 0.0–4.4)
Cholesterol, Total: 127 mg/dL (ref 100–199)
HDL: 46 mg/dL (ref >39)
LDL Chol Calc (NIH): 43 mg/dL (ref 0–99)
Triglycerides: 241 mg/dL — ABNORMAL HIGH (ref 0–149)
VLDL Cholesterol Cal: 38 mg/dL (ref 5–40)

## 2020-07-08 ENCOUNTER — Other Ambulatory Visit: Payer: Self-pay | Admitting: "Endocrinology

## 2020-07-08 DIAGNOSIS — E1159 Type 2 diabetes mellitus with other circulatory complications: Secondary | ICD-10-CM

## 2020-07-09 ENCOUNTER — Ambulatory Visit (INDEPENDENT_AMBULATORY_CARE_PROVIDER_SITE_OTHER): Payer: Medicare Other | Admitting: "Endocrinology

## 2020-07-09 ENCOUNTER — Encounter: Payer: Self-pay | Admitting: "Endocrinology

## 2020-07-09 ENCOUNTER — Other Ambulatory Visit: Payer: Self-pay

## 2020-07-09 VITALS — BP 140/78 | HR 80 | Ht 64.0 in | Wt 251.8 lb

## 2020-07-09 DIAGNOSIS — E1159 Type 2 diabetes mellitus with other circulatory complications: Secondary | ICD-10-CM

## 2020-07-09 DIAGNOSIS — I10 Essential (primary) hypertension: Secondary | ICD-10-CM

## 2020-07-09 DIAGNOSIS — E559 Vitamin D deficiency, unspecified: Secondary | ICD-10-CM

## 2020-07-09 DIAGNOSIS — E782 Mixed hyperlipidemia: Secondary | ICD-10-CM | POA: Diagnosis not present

## 2020-07-09 DIAGNOSIS — I2510 Atherosclerotic heart disease of native coronary artery without angina pectoris: Secondary | ICD-10-CM | POA: Diagnosis not present

## 2020-07-09 LAB — POCT GLYCOSYLATED HEMOGLOBIN (HGB A1C): HbA1c, POC (controlled diabetic range): 10.4 % — AB (ref 0.0–7.0)

## 2020-07-09 NOTE — Progress Notes (Signed)
07/09/2020   Endocrinology follow-up note   Subjective:    Patient ID: [name], female    DOB: Sep 08, 1950. Patient is returning with his glycemic profile improving from last visit.  She has been on regular follow-up for  management of currently uncontrolled, complicated type 2 diabetes; hyperlipidemia; hypertension.  Past Medical History:  Diagnosis Date  . Anxiety and depression   . Arthritis   . CAD (coronary artery disease)    Stent placement circumflex coronary 2007, catheterization 2008 patent stents. Normal LV function  . Chest pain   . CHF (congestive heart failure) ([place])   . COPD (chronic obstructive pulmonary disease) (HCC)    no home O2  . Depression   . Diabetes mellitus    Insulin dependent  . Diabetic polyneuropathy (H[facility])     severe on multiple medications  . Dyslipidemia   . GERD (gastroesophageal reflux disease)   . Headache(784.0)   . Heart attack ([place])   . Heartburn   . 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 High cholesterol   . Hx of blood clots   . Hypertension   . MI (myocardial infarction) ([place])    2007  . Obstructive sleep apnea    CPAP, setting ?2  . Palpitations   . Sleep apnea   . Tachycardia    Past Surgical History:  Procedure Laterality Date  . ABDOMINAL HYSTERECTOMY    . Arm Surgery Bilateral    due to fall-pt broke both forearms  . BIOPSY N/A 07/01/2013   Procedure: BIOPSY;  Surgeon: Rogene Houston, MD;  Location: [facility];  Service: Endoscopy;  Laterality: N/A;  . CHOLECYSTECTOMY    . COLONOSCOPY  08/06/2011   Procedure: COLONOSCOPY;  Surgeon: Rogene Houston, MD;  [facility];  Service: Endoscopy;  Laterality: N/A;  215  . COLONOSCOPY WITH PROPOFOL N/A 12/31/2012   Procedure: COLONOSCOPY WITH PROPOFOL;  Surgeon: Rogene Houston, MD;  Location: [facility];  Service: Endoscopy;  Laterality: N/A;  in cecum at 0814, total withdrawal time 56mn  . COLONOSCOPY WITH PROPOFOL N/A 02/05/2018   Procedure: COLONOSCOPY WITH PROPOFOL;   Surgeon: RRogene Houston MD;  [facility];  Service: Endoscopy;  Laterality: N/A;  1:25  . CORONARY ANGIOPLASTY WITH STENT PLACEMENT    . CYSTOSCOPY W/ URETERAL STENT PLACEMENT [facility]10/2019   [facility] . CYSTOSCOPY WITH RETROGRADE PYELOGRAM, URETEROSCOPY AND STENT PLACEMENT Right 12/22/2019   Procedure: CYSTOSCOPY WITH RIGHT URETERAL STENT REMOVAL; RIGHT RETROGRADE PYELOGRAM,RIGHT URETEROSCOPY;  Surgeon: MCleon Gustin MD;  Location: [facility];  Service: Urology;  Laterality: Right;  . ESOPHAGEAL DILATION N/A 06/07/2015   Procedure: ESOPHAGEAL DILATION;  Surgeon: NRogene Houston MD;  [facility];  Service: Endoscopy;  Laterality: N/A;  . ESOPHAGOGASTRODUODENOSCOPY N/A 06/07/2015   Procedure: ESOPHAGOGASTRODUODENOSCOPY (EGD);  Surgeon: NRogene Houston MD;  [facility];  Service: Endoscopy;  Laterality: N/A;  2:45 - moved to 1:00 - Ann to notify pt  . ESOPHAGOGASTRODUODENOSCOPY (EGD) WITH PROPOFOL N/A 12/31/2012   Procedure: ESOPHAGOGASTRODUODENOSCOPY (EGD) WITH PROPOFOL;  Surgeon: NRogene Houston MD;  Location: [facility];  Service: Endoscopy;  Laterality: N/A;  GE junction 37,  . ESOPHAGOGASTRODUODENOSCOPY (EGD) WITH PROPOFOL N/A 07/01/2013   Procedure: ESOPHAGOGASTRODUODENOSCOPY (EGD) WITH PROPOFOL;  Surgeon: NRogene Houston MD;  Location: [facility];  Service: Endoscopy;  Laterality: N/A;  950  . MALONEY DILATION N/A 12/31/2012   Procedure: MVenia MinksDILATION;  Surgeon: NRogene Houston MD;  Location:  [facility];  Service: Endoscopy;  Laterality: N/A;  used a # 54,#56  . MALONEY DILATION N/A 07/01/2013   Procedure: Venia Minks DILATION;  Surgeon: Rogene Houston, MD;  Location: [facility];  Service: Endoscopy;  Laterality: N/A;  54/58; no heme present  . POLYPECTOMY  02/05/2018   Procedure: POLYPECTOMY;  Surgeon: Rogene Houston, MD;  [facility];  Service: Endoscopy;;  distal rectum (HS)  . STONE EXTRACTION WITH BASKET Right 12/22/2019   Procedure: STONE  EXTRACTION WITH BASKET;  Surgeon: Cleon Gustin, MD;  Location: [facility];  Service: Urology;  Laterality: Right;   Social History   Socioeconomic History  . Marital status: Divorced    Spouse name: Not on file  . Number of children: 4  . Years of education: Not on file  . Highest education level: Not on file  Occupational History  . Occupation: Disabled  Tobacco Use  . Smoking status: Never Smoker  . Smokeless tobacco: Never Used  Vaping Use  . Vaping Use: Never used  Substance and Sexual Activity  . Alcohol use: No    Alcohol/week: 0.0 standard drinks  . Drug use: No  . Sexual activity: Not on file  Other Topics Concern  . Not on file  Social History Narrative   Patient does not get regular exercise   Denies caffeine use    Social Determinants of Health   Financial Resource Strain: Not on file  Food Insecurity: Not on file  Transportation Needs: Not on file  Physical Activity: Not on file  Stress: Not on file  Social Connections: Not on file   Outpatient Encounter Medications as of 07/09/2020  Medication Sig  . ALPRAZolam (XANAX) 0.5 MG tablet Take 0.5 mg by mouth 2 (two) times daily as needed.  . Armodafinil 150 MG tablet Take 150 mg by mouth daily.  Marland Kitchen aspirin EC 81 MG tablet Take 1 tablet (81 mg total) by mouth daily.  Marland Kitchen atorvastatin (LIPITOR) 40 MG tablet Take 40 mg by mouth daily.  . B-D ULTRAFINE III SHORT PEN 31G X 8 MM MISC Inject into the skin 4 (four) times daily.  Marland Kitchen buPROPion (WELLBUTRIN SR) 100 MG 12 hr tablet Take 100 mg by mouth daily.  . Cholecalciferol (VITAMIN D3) 125 MCG (5000 UT) CAPS TAKE ONE CAPSULE BY MOUTH DAILY (Patient taking differently: Take 5,000 Units by mouth daily.)  . clotrimazole-betamethasone (LOTRISONE) cream Apply to affected area 2 times daily for 21 days  . diclofenac Sodium (VOLTAREN) 1 % GEL APPLY TO BOTH KNEES THREE TIMES DAILY  . diltiazem (CARDIZEM CD) 240 MG 24 hr capsule Take 240 mg by mouth daily.  Marland Kitchen doxepin (SINEQUAN)  25 MG capsule Take 75 mg by mouth at bedtime.   Marland Kitchen estradiol (ESTRACE) 0.5 MG tablet Take 0.5 mg by mouth daily.   . FEROSUL 325 (65 Fe) MG tablet Take 325 mg by mouth every morning.  . furosemide (LASIX) 40 MG tablet Take 1 tablet (40 mg total) by mouth daily.  . furosemide (LASIX) 40 MG tablet Take 1 tablet by mouth daily.  Marland Kitchen gabapentin (NEURONTIN) 400 MG capsule Take 400 mg by mouth 3 (three) times daily.  Marland Kitchen glipiZIDE (GLUCOTROL XL) 5 MG 24 hr tablet TAKE 1 TABLET BY MOUTH EVERY DAY WITH BREAKFAST (Patient taking differently: Take 5 mg by mouth daily with breakfast.)  . lisinopril (ZESTRIL) 5 MG tablet Take 1 tablet (5 mg total) by mouth daily.  . meloxicam (MOBIC) 7.5 MG tablet Take 7.5 mg  by mouth in the morning and at bedtime.   . metFORMIN (GLUCOPHAGE) 500 MG tablet TAKE 1 TABLET BY MOUTH TWICE DAILY (Patient taking differently: Take 500 mg by mouth 2 (two) times daily with a meal.)  . metoCLOPramide (REGLAN) 5 MG tablet Take 5 mg by mouth 3 (three) times daily as needed for nausea or vomiting.   . metoprolol succinate (TOPROL-XL) 100 MG 24 hr tablet Take 100 mg by mouth daily. Take with or immediately following a meal.  . montelukast (SINGULAIR) 10 MG tablet Take 10 mg by mouth at bedtime.   Marland Kitchen morphine (MS CONTIN) 30 MG 12 hr tablet Take 1 tablet (30 mg total) by mouth every 12 (twelve) hours.  . nitroGLYCERIN (NITROSTAT) 0.4 MG SL tablet Place 1 tablet (0.4 mg total) under the tongue every 5 (five) minutes x 3 doses as needed. For chest pains  . NOVOLOG FLEXPEN 100 UNIT/ML FlexPen Inject into the skin.  Marland Kitchen ondansetron (ZOFRAN) 4 MG tablet Take 4 mg by mouth every 6 (six) hours as needed for nausea or vomiting.  . pantoprazole (PROTONIX) 40 MG tablet TAKE 1 TABLET BY MOUTH TWICE DAILY BEFORE MEALS (Patient taking differently: Take 40 mg by mouth 2 (two) times daily before a meal.)  . polyethylene glycol powder (GLYCOLAX/MIRALAX) 17 GM/SCOOP powder Take 1 Container by mouth daily as needed for  moderate constipation.   . potassium chloride (K-DUR,KLOR-CON) 10 MEQ tablet Take 10 mEq by mouth 2 (two) times daily.  Marland Kitchen PROAIR HFA 108 (90 Base) MCG/ACT inhaler Inhale 2 puffs into the lungs every 4 (four) hours as needed for wheezing or shortness of breath.   . promethazine (PHENERGAN) 25 MG tablet Take 1 tablet (25 mg total) by mouth 4 (four) times daily as needed.  . sucralfate (CARAFATE) 1 g tablet Take 1 g by mouth 4 (four) times daily.  . SURE COMFORT INS SYR 1CC/30G 30G X 5/16" 1 ML MISC USE WITH TRESIBA  . topiramate (TOPAMAX) 200 MG tablet Take 200 mg by mouth at bedtime.  . [DISCONTINUED] TRESIBA FLEXTOUCH 100 UNIT/ML FlexTouch Pen Inject 80 Units into the skin at bedtime.   No facility-administered encounter medications on file as of 07/09/2020.   ALLERGIES: Allergies  Allergen Reactions  . Amphetamine-Dextroamphetamine Swelling  . Nitrofuran Derivatives Itching and Swelling  . Amphetamine-Dextroamphet Er Swelling  . Pregabalin Swelling  . Topiramate Other (See Comments)    Tongue tingle  . Verelan [Verapamil] Rash   VACCINATION STATUS:  There is no immunization history on file for this patient.  Diabetes She presents for her follow-up diabetic visit. She has type 2 diabetes mellitus. Onset time: She was diagnosed at approximate age of 3 years. Her disease course has been worsening. There are no hypoglycemic associated symptoms. Pertinent negatives for hypoglycemia include no confusion, headaches, pallor or seizures. Pertinent negatives for diabetes include no chest pain, no fatigue, no polydipsia, no polyphagia and no polyuria. There are no hypoglycemic complications. Symptoms are worsening. Diabetic complications include autonomic neuropathy, heart disease, peripheral neuropathy, PVD and retinopathy. Risk factors for coronary artery disease include dyslipidemia, diabetes mellitus, obesity, hypertension and sedentary lifestyle. Current diabetic treatments: Lantus 65 units  daily at bedtime, Humalog 15 units 3 times a day before meals. She mentions allergy to Victoza, Byetta, and metformin. Her weight is fluctuating minimally. She is following a generally unhealthy diet. When asked about meal planning, she reported none. She has not had a previous visit with a dietitian. She never participates in exercise. Her home blood  glucose trend is increasing steadily. Her breakfast blood glucose range is generally 180-200 mg/dl. Her bedtime blood glucose range is generally >200 mg/dl. Her overall blood glucose range is >200 mg/dl. (She presents with significantly above target glycemic profile both fasting and postprandial.  She did not document her insulin administration on her logs.  Her point-of-care A1c was 10.4%, increasing from 9.5%.  She did not document any hypoglycemia.   ) An ACE inhibitor/angiotensin II receptor blocker is not being taken. Eye exam is current.  Hyperlipidemia This is a chronic problem. The current episode started more than 1 year ago. The problem is uncontrolled. Recent lipid tests were reviewed and are high. Exacerbating diseases include diabetes and obesity. Pertinent negatives include no chest pain, myalgias or shortness of breath. Current antihyperlipidemic treatment includes statins. Risk factors for coronary artery disease include diabetes mellitus, dyslipidemia, hypertension, obesity and a sedentary lifestyle.  Hypertension This is a chronic problem. The current episode started more than 1 year ago. Pertinent negatives include no chest pain, headaches, palpitations or shortness of breath. Risk factors for coronary artery disease include diabetes mellitus, dyslipidemia, obesity and sedentary lifestyle. Hypertensive end-organ damage includes CAD/MI, PVD and retinopathy.    Review of systems: Limited as above.  Objective:    BP 140/78   Pulse 80   Ht 5' 4"  (1.626 m)   Wt 251 lb 12.8 oz (114.2 kg)   BMI 43.22 kg/m   Wt Readings from Last 3  Encounters:  07/09/20 251 lb 12.8 oz (114.2 kg)  05/22/20 249 lb (112.9 kg)  05/07/20 243 lb (110.2 kg)     CMP     Component Value Date/Time   NA 139 07/05/2020 1338   K 4.9 07/05/2020 1338   CL 100 07/05/2020 1338   CO2 23 07/05/2020 1338   GLUCOSE 223 (H) 07/05/2020 1338   GLUCOSE 213 (H) 10/27/2019 1130   BUN 16 07/05/2020 1338   CREATININE 0.64 07/05/2020 1338   CREATININE 0.70 11/02/2018 1105   CALCIUM 9.4 07/05/2020 1338   PROT 6.5 07/05/2020 1338   ALBUMIN 3.6 (L) 07/05/2020 1338   AST 14 07/05/2020 1338   ALT 17 07/05/2020 1338   ALKPHOS 111 07/05/2020 1338   BILITOT 0.4 07/05/2020 1338   GFRNONAA >60 10/27/2019 1130   GFRNONAA 91 11/02/2018 1105   GFRAA >60 10/27/2019 1130   GFRAA 105 11/02/2018 1105   Diabetic Labs (most recent): Lab Results  Component Value Date   HGBA1C 10.4 (A) 07/09/2020   HGBA1C 9.5 02/29/2020   HGBA1C 9.7 (H) 10/27/2019    Lipid Panel     Component Value Date/Time   CHOL 127 07/05/2020 1338   TRIG 241 (H) 07/05/2020 1338   HDL 46 07/05/2020 1338   CHOLHDL 2.8 07/05/2020 1338   CHOLHDL 2.8 07/06/2018 0936   VLDL UNABLE TO CALCULATE IF TRIGLYCERIDE OVER 400 mg/dL 02/18/2017 0528   LDLCALC 43 07/05/2020 1338   LDLCALC 90 07/06/2018 0936     Assessment & Plan:   1. Type 2 diabetes mellitus with vascular disease ([place]) - Patient has currently uncontrolled symptomatic type 2 DM since  70 years of age.  She presents with significantly above target glycemic profile both fasting and postprandial.  She did not document her insulin administration on her logs.  Her point-of-care A1c was 10.4%, increasing from 9.5%.  She did not document any hypoglycemia.     Recent labs reviewed.  - Her diabetes is complicated by obesity/sedentary life, coronary artery disease status post stent  placement and patient remains at a high risk for more acute and chronic complications of diabetes which include CAD, CVA, CKD, retinopathy, and neuropathy. These  are all discussed in detail with the patient.  - I have counseled the patient on diet management and weight loss, by adopting a carbohydrate restricted/protein rich diet.  - she acknowledges that there is a room for improvement in her food and drink choices. - Suggestion is made for her to avoid simple carbohydrates  from her diet including Cakes, Sweet Desserts, Ice Cream, Soda (diet and regular), Sweet Tea, Candies, Chips, Cookies, Store Bought Juices, Alcohol in Excess of  1-2 drinks a day, Artificial Sweeteners,  Coffee Creamer, and "Sugar-free" Products, Lemonade. This will help patient to have more stable blood glucose profile and potentially avoid unintended weight gain.   - Patient is advised to stick to a routine mealtimes to eat 3 meals  a day and avoid unnecessary snacks ( to snack only to correct hypoglycemia).   - I have approached patient with the following individualized plan to manage diabetes and patient agrees:   -She presents with loss of control of her diabetes and glycemic profile.  She is advised to increase her Tresiba to 80 units nightly, continue to monitor blood glucose 4 times a day-before meals and at bedtime and return in 10 days with her meter and logs for evaluation.    -She will likely need multiple daily injections of insulin in order for her to achieve control of diabetes to target.   -Her insurance did not provide coverage for CGM device.  If she presents with significantly above target postprandial glycemic profile, she will be considered for rapid acting insulin. She continues to have normal renal function, advised to continue metformin 500 mg p.o. twice daily, glipizide 5 mg XL p.o. daily at breakfast.   -Patient is encouraged to call clinic for blood glucose levels less than 70 or above 200 mg /dl.  -She has intolerance to GLP-1 receptor agonists.    2) BP/HTN: -Her blood pressure is controlled to near target levels.  She has adequate medications,  advised to continue including metoprolol XL 50 mg p.o. daily.  3) Lipids/HPL: She has uncontrolled hypertriglyceridemia , still high at 439  LDL better at 50, HDL at 77. She is advised to continue Lipitor 40 mg p.o. nightly.  She is educated on the fact that   better control of diabetes will help for triglycerides.   4) Chronic Care/Health Maintenance:  -Patient is on Statin medications and encouraged to continue to follow up with Ophthalmology, Podiatrist at least yearly or according to recommendations, and advised to   stay away from smoking. I have recommended yearly flu vaccine and pneumonia vaccination at least every 5 years;  and  sleep for at least 7 hours a day. -She will benefit from a set of diabetic shoes, paperwork filled for her. She had a moderate temperature 99.1 F this afternoon with no localization.  She is advised to take Tylenol 1 dose and rest.  If she develops fever, she is advised to Contact her PMD.  - I advised patient to maintain close follow up with Glenda Chroman, MD for primary care needs.  I spent 42 minutes in the care of the patient today including review of labs from [place], Lipids, Thyroid Function, Hematology (current and previous including abstractions from other facilities); face-to-face time discussing  her blood glucose readings/logs, discussing hypoglycemia and hyperglycemia episodes and symptoms, medications doses, her options of  short and long term treatment based on the latest standards of care / guidelines;  discussion about incorporating lifestyle medicine;  and documenting the encounter.    Please refer to Patient Instructions for Blood Glucose Monitoring and Insulin/Medications Dosing Guide"  in media tab for additional information. Please  also refer to " Patient Self Inventory" in the Media  tab for reviewed elements of pertinent patient history.  [name] participated in the discussions, expressed understanding, and voiced agreement with the above  plans.  All questions were answered to her satisfaction. she is encouraged to contact clinic should she have any questions or concerns prior to her return visit.    Follow up plan: - Patient to go to emergency room for better evaluation of chest pain. Return in about 10 days (around 07/19/2020) for F/U with Meter and Logs Only - no Labs.  Glade Lloyd, MD Phone: [phone]  Fax: [phone]  This note was partially dictated with voice recognition software. Similar sounding words can be transcribed inadequately or may not  be corrected upon review.  07/09/2020, 4:43 PM

## 2020-07-09 NOTE — Patient Instructions (Signed)

## 2020-07-13 ENCOUNTER — Other Ambulatory Visit: Payer: Self-pay

## 2020-07-13 ENCOUNTER — Encounter: Payer: Self-pay | Admitting: Registered Nurse

## 2020-07-13 ENCOUNTER — Encounter: Payer: Medicare Other | Attending: Registered Nurse | Admitting: Registered Nurse

## 2020-07-13 VITALS — BP 149/82 | HR 79 | Temp 98.5°F | Ht 64.0 in | Wt 245.0 lb

## 2020-07-13 DIAGNOSIS — M48062 Spinal stenosis, lumbar region with neurogenic claudication: Secondary | ICD-10-CM | POA: Diagnosis present

## 2020-07-13 DIAGNOSIS — M5412 Radiculopathy, cervical region: Secondary | ICD-10-CM | POA: Diagnosis present

## 2020-07-13 DIAGNOSIS — M5416 Radiculopathy, lumbar region: Secondary | ICD-10-CM | POA: Insufficient documentation

## 2020-07-13 DIAGNOSIS — G894 Chronic pain syndrome: Secondary | ICD-10-CM | POA: Diagnosis present

## 2020-07-13 DIAGNOSIS — Z79891 Long term (current) use of opiate analgesic: Secondary | ICD-10-CM | POA: Diagnosis present

## 2020-07-13 DIAGNOSIS — M7061 Trochanteric bursitis, right hip: Secondary | ICD-10-CM | POA: Insufficient documentation

## 2020-07-13 DIAGNOSIS — M1712 Unilateral primary osteoarthritis, left knee: Secondary | ICD-10-CM

## 2020-07-13 DIAGNOSIS — M546 Pain in thoracic spine: Secondary | ICD-10-CM | POA: Insufficient documentation

## 2020-07-13 DIAGNOSIS — M542 Cervicalgia: Secondary | ICD-10-CM | POA: Insufficient documentation

## 2020-07-13 DIAGNOSIS — I2510 Atherosclerotic heart disease of native coronary artery without angina pectoris: Secondary | ICD-10-CM

## 2020-07-13 DIAGNOSIS — M1711 Unilateral primary osteoarthritis, right knee: Secondary | ICD-10-CM | POA: Diagnosis present

## 2020-07-13 DIAGNOSIS — M7062 Trochanteric bursitis, left hip: Secondary | ICD-10-CM | POA: Diagnosis present

## 2020-07-13 DIAGNOSIS — Z5181 Encounter for therapeutic drug level monitoring: Secondary | ICD-10-CM | POA: Diagnosis present

## 2020-07-13 MED ORDER — MORPHINE SULFATE ER 30 MG PO TBCR: 30.0000 mg | EXTENDED_RELEASE_TABLET | Freq: Two times a day (BID) | ORAL | 0 refills | Status: DC

## 2020-07-13 NOTE — Progress Notes (Signed)
Subjective:    Patient ID: [name], female    DOB: March 05, 1951, 70 y.o.   MRN: [id number]  HPI: [name] is a 70 y.o. female who returns for follow up appointment for chronic pain and medication refill. She states her pain is located in her neck radiating into he bilateral shoulders, mid- lower back pain radiating into her bilateral hips and bilateral lower extremities. Also reports bilateral knee pain. She rates her pain 7. Her current exercise regime is walking and performing stretching exercises.  [name] equivalent is 60.00 MME.She is also prescribed Alprazolam  by [facility]. We have discussed the black box warning of using opioids and benzodiazepines. I highlighted the dangers of using these drugs together and discussed the adverse events including respiratory suppression, overdose, cognitive impairment and importance of compliance with current regimen. We will continue to monitor and adjust as indicated.   . Last UDS was Performed on 05/22/2020, it was consistent.  Pain Inventory Average Pain 8 Pain Right Now 7 My pain is sharp, burning, dull, stabbing, tingling and aching  In the last 24 hours, has pain interfered with the following? General activity 7 Relation with others 7 Enjoyment of life 7 What TIME of day is your pain at its worst? daytime and night Sleep (in general) Good  Pain is worse with: walking, bending, sitting, inactivity, standing and some activites Pain improves with: rest, heat/ice and medication Relief from Meds: 1  Family History  Problem Relation Age of Onset  . Heart attack Mother   . Colon cancer Mother   . Heart attack Father   . Stroke Sister   . Hemophilia Child   . Cancer Other   . Coronary artery disease Other   . Cancer Brother   . Cancer Maternal Aunt   . Cancer Maternal Uncle   . Cancer Paternal Aunt   . Cancer Paternal Uncle   . Cancer Brother    Social History   Socioeconomic History  . Marital  status: Divorced    Spouse name: Not on file  . Number of children: 4  . Years of education: Not on file  . Highest education level: Not on file  Occupational History  . Occupation: Disabled  Tobacco Use  . Smoking status: Never Smoker  . Smokeless tobacco: Never Used  Vaping Use  . Vaping Use: Never used  Substance and Sexual Activity  . Alcohol use: No    Alcohol/week: 0.0 standard drinks  . Drug use: No  . Sexual activity: Not on file  Other Topics Concern  . Not on file  Social History Narrative   Patient does not get regular exercise   Denies caffeine use    Social Determinants of Health   Financial Resource Strain: Not on file  Food Insecurity: Not on file  Transportation Needs: Not on file  Physical Activity: Not on file  Stress: Not on file  Social Connections: Not on file   Past Surgical History:  Procedure Laterality Date  . ABDOMINAL HYSTERECTOMY    . Arm Surgery Bilateral    due to fall-pt broke both forearms  . BIOPSY N/A 07/01/2013   Procedure: BIOPSY;  Surgeon: Rogene Houston, MD;  [facility];  Service: Endoscopy;  Laterality: N/A;  . CHOLECYSTECTOMY    . COLONOSCOPY  08/06/2011   Procedure: COLONOSCOPY;  Surgeon: Rogene Houston, MD;  [facility];  Service: Endoscopy;  Laterality: N/A;  215  . COLONOSCOPY WITH  PROPOFOL N/A 12/31/2012   Procedure: COLONOSCOPY WITH PROPOFOL;  Surgeon: Rogene Houston, MD;  Location: [facility];  Service: Endoscopy;  Laterality: N/A;  in cecum at 0814, total withdrawal time 33mn  . COLONOSCOPY WITH PROPOFOL N/A 02/05/2018   Procedure: COLONOSCOPY WITH PROPOFOL;  Surgeon: RRogene Houston MD;  [facility];  Service: Endoscopy;  Laterality: N/A;  1:25  . CORONARY ANGIOPLASTY WITH STENT PLACEMENT    . CYSTOSCOPY W/ URETERAL STENT PLACEMENT [facility]   [facility] CYSTOSCOPY WITH RETROGRADE PYELOGRAM, URETEROSCOPY AND STENT PLACEMENT Right 12/22/2019   Procedure: CYSTOSCOPY WITH RIGHT URETERAL  STENT REMOVAL; RIGHT RETROGRADE PYELOGRAM,RIGHT URETEROSCOPY;  Surgeon: MCleon Gustin MD;  [facility]: [facility];  Service: Urology;  Laterality: Right;  . ESOPHAGEAL DILATION N/A 06/07/2015   Procedure: ESOPHAGEAL DILATION;  Surgeon: NRogene Houston MD;  [facility];  Service: Endoscopy;  Laterality: N/A;  . ESOPHAGOGASTRODUODENOSCOPY N/A 06/07/2015   Procedure: ESOPHAGOGASTRODUODENOSCOPY (EGD);  Surgeon: NRogene Houston MD;  [facility];  Service: Endoscopy;  Laterality: N/A;  2:45 - moved to 1:00 - Ann to notify pt  . ESOPHAGOGASTRODUODENOSCOPY (EGD) WITH PROPOFOL N/A 12/31/2012   Procedure: ESOPHAGOGASTRODUODENOSCOPY (EGD) WITH PROPOFOL;  Surgeon: NRogene Houston MD;  [facility];  Service: Endoscopy;  Laterality: N/A;  GE junction 37,  . ESOPHAGOGASTRODUODENOSCOPY (EGD) WITH PROPOFOL N/A 07/01/2013   Procedure: ESOPHAGOGASTRODUODENOSCOPY (EGD) WITH PROPOFOL;  Surgeon: NRogene Houston MD;  [facility];  Service: Endoscopy;  Laterality: N/A;  950  . MALONEY DILATION N/A 12/31/2012   Procedure: MALONEY DILATION;  Surgeon: NRogene Houston MD;  [facility];  Service: Endoscopy;  Laterality: N/A;  used a # 54,#56  . MALONEY DILATION N/A 07/01/2013   Procedure: MVenia MinksDILATION;  Surgeon: NRogene Houston MD;  [facility];  Service: Endoscopy;  Laterality: N/A;  54/58; no heme present  . POLYPECTOMY  02/05/2018   Procedure: POLYPECTOMY;  Surgeon: RRogene Houston MD;  [facility];  Service: Endoscopy;;  distal rectum (HS)  . STONE EXTRACTION WITH BASKET Right 12/22/2019   Procedure: STONE EXTRACTION WITH BASKET;  Surgeon: MCleon Gustin MD;  [facility];  Service: Urology;  Laterality: Right;   Past Surgical History:  Procedure Laterality Date  . ABDOMINAL HYSTERECTOMY    . Arm Surgery Bilateral    due to fall-pt broke both forearms  . BIOPSY N/A 07/01/2013   Procedure: BIOPSY;  Surgeon: NRogene Houston MD;  [facility];   Service: Endoscopy;  Laterality: N/A;  . CHOLECYSTECTOMY    . COLONOSCOPY  08/06/2011   Procedure: COLONOSCOPY;  Surgeon: NRogene Houston MD;  [facility];  Service: Endoscopy;  Laterality: N/A;  215  . COLONOSCOPY WITH PROPOFOL N/A 12/31/2012   Procedure: COLONOSCOPY WITH PROPOFOL;  Surgeon: NRogene Houston MD;  [facility]: [facility];  Service: Endoscopy;  Laterality: N/A;  in cecum at 0814, total withdrawal time 172m  . COLONOSCOPY WITH PROPOFOL N/A 02/05/2018   Procedure: COLONOSCOPY WITH PROPOFOL;  Surgeon: ReRogene HoustonMD;  [facility];  Service: Endoscopy;  Laterality: N/A;  1:25  . CORONARY ANGIOPLASTY WITH STENT PLACEMENT    . CYSTOSCOPY W/ URETERAL STENT PLACEMENT [facility] CYSTOSCOPY WITH RETROGRADE PYELOGRAM, URETEROSCOPY AND STENT PLACEMENT Right 12/22/2019   Procedure: CYSTOSCOPY WITH RIGHT URETERAL STENT REMOVAL; RIGHT RETROGRADE PYELOGRAM,RIGHT URETEROSCOPY;  Surgeon: McCleon GustinMD;  [facility];  Service: Urology;  Laterality: Right;  . ESOPHAGEAL DILATION N/A 06/07/2015   Procedure: ESOPHAGEAL DILATION;  Surgeon: Rogene Houston, MD;  [facility];  Service: Endoscopy;  Laterality: N/A;  . ESOPHAGOGASTRODUODENOSCOPY N/A 06/07/2015   Procedure: ESOPHAGOGASTRODUODENOSCOPY (EGD);  Surgeon: Rogene Houston, MD;  [facility];  Service: Endoscopy;  Laterality: N/A;  2:45 - moved to 1:00 - Ann to notify pt  . ESOPHAGOGASTRODUODENOSCOPY (EGD) WITH PROPOFOL N/A 12/31/2012   Procedure: ESOPHAGOGASTRODUODENOSCOPY (EGD) WITH PROPOFOL;  Surgeon: Rogene Houston, MD;  [facility];  Service: Endoscopy;  Laterality: N/A;  GE junction 37,  . ESOPHAGOGASTRODUODENOSCOPY (EGD) WITH PROPOFOL N/A 07/01/2013   Procedure: ESOPHAGOGASTRODUODENOSCOPY (EGD) WITH PROPOFOL;  Surgeon: Rogene Houston, MD;  [facility];  Service: Endoscopy;  Laterality: N/A;  950  . MALONEY DILATION N/A 12/31/2012   Procedure: MALONEY  DILATION;  Surgeon: Rogene Houston, MD;  [facility];  Service: Endoscopy;  Laterality: N/A;  used a # 54,#56  . MALONEY DILATION N/A 07/01/2013   Procedure: Venia Minks DILATION;  Surgeon: Rogene Houston, MD;  [facility];  Service: Endoscopy;  Laterality: N/A;  54/58; no heme present  . POLYPECTOMY  02/05/2018   Procedure: POLYPECTOMY;  Surgeon: Rogene Houston, MD;  [facility];  Service: Endoscopy;;  distal rectum (HS)  . STONE EXTRACTION WITH BASKET Right 12/22/2019   Procedure: STONE EXTRACTION WITH BASKET;  Surgeon: Cleon Gustin, MD;  [facility]: [facility];  Service: Urology;  Laterality: Right;   Past Medical History:  Diagnosis Date  . Anxiety and depression   . Arthritis   . CAD (coronary artery disease)    Stent placement circumflex coronary 2007, catheterization 2008 patent stents. Normal LV function  . Chest pain   . CHF (congestive heart failure) ([place])   . COPD (chronic obstructive pulmonary disease) ([facility])    no home O2  . Depression   . Diabetes mellitus    Insulin dependent  . Diabetic polyneuropathy ([facility])     severe on multiple medications  . Dyslipidemia   . GERD (gastroesophageal reflux disease)   . Headache(784.0)   . Heart attack ([place])   . Heartburn   . 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 High cholesterol   . Hx of blood clots   . Hypertension   . MI (myocardial infarction) ([place])    2007  . Obstructive sleep apnea    CPAP, setting ?2  . Palpitations   . Sleep apnea   . Tachycardia    BP (!) 149/82   Pulse 79   Temp 98.5 F (36.9 C)   Ht 5' 4"  (1.626 m)   Wt 245 lb (111.1 kg)   SpO2 93%   BMI 42.05 kg/m   Opioid Risk Score:   Fall Risk Score:  `1  Depression screen PHQ 2/9  Depression screen [facility] 2/9 01/24/2020 11/18/2019 10/26/2019 09/26/2019 03/24/2019 07/16/2018 06/12/2017  Decreased Interest 0 1 0 0 1 1 3   Down, Depressed, Hopeless 0 1 0 0 1 1 3   PHQ - 2 Score 0 2 0 0 2 2 6   Altered sleeping - - - - - - 3  Tired, decreased energy - -  - - - - 3  Change in appetite - - - - - - 0  Feeling bad or failure about yourself  - - - - - - 3  Trouble concentrating - - - - - - 3  Moving slowly or  fidgety/restless - - - - - - 1  Suicidal thoughts - - - - - - 0  PHQ-9 Score - - - - - - 19  Difficult doing work/chores - - - - - - Very difficult  Some recent data might be hidden     Review of Systems  Musculoskeletal: Positive for back pain, gait problem and neck pain.       Leg and foot pain swollen  All other systems reviewed and are negative.      Objective:   Physical Exam Vitals and nursing note reviewed.  Constitutional:      Appearance: Normal appearance.  Neck:     Comments: Cervical Paraspinal Tenderness: C=5=C-6 Cardiovascular:     Rate and Rhythm: Normal rate and regular rhythm.     Pulses: Normal pulses.     Heart sounds: Normal heart sounds.  Pulmonary:     Effort: Pulmonary effort is normal.     Breath sounds: Normal breath sounds.  Musculoskeletal:     Cervical back: Normal range of motion and neck supple.     Comments: Normal Muscle Bulk and Muscle Testing Reveals:  Upper Extremities: Full  ROM and Muscle Strength 5/5 Bilateral AC Joint Tenderness  Thoracic and Lumbar Hypersensitivity Bilateral Greater Trochanter Tenderness Lower Extremities : Decreased ROM and Muscle Strength 5/5 Bilateral Lower Extremities Flexion Produces Pain into her bilateral lower extremities and bilateral Patella's Arises from Table slowly using walker for support Narrow Based Gait   Skin:    General: Skin is warm and dry.  Neurological:     Mental Status: She is alert and oriented to person, place, and time.  Psychiatric:        Mood and Affect: Mood normal.        Behavior: Behavior normal.           Assessment & Plan:  1.Lumbar pain lumbar spondylosis: Encouraged to continue toincrease Activity as tolerated.07/13/2020 Continue current medication regimen. Refilled:MS Contin 30 MG one tablet  every12hours #60. BoyfriendJoe is dispensing Ms. Decaire Medication she reports. We will continue the opioid monitoring program, this consists of regular clinic visits, examinations, urine drug screen, pill counts as well as use of [place] Controlled Substance Reporting system. A 12 month History has been reviewed on the [place] Controlled Substance Reporting Systemon04/29/2022. 2. Lumbar Radiculitis: Continuecurrent medication regimen withGabapentin.07/13/2020 3. Bilateral Knee Pain/ Degenerative:Continue Voltaren Gel. Continue to Monitor.07/13/2020 4. Severe diabetic poly neuropathy: Continuecurrent medication regimen withGabapentin.07/13/2020 5. Insomnia: ContinueArmodafinilPsychiatry Following.07/13/2020 6.Anxiety/Depression:Continue Current Medication regimen as prescribed byPCP andPsychiatry.07/13/2020 7. De Quervains Tenosynovitis: No Complaints Today.07/13/2020 8.BilateralGreater Trochanteric Bursitis:Continue with Ice and Heat Therapy. Continue to Monitor.07/13/2020. 9. Cervicalgia/ Cervical Radiculitis:Continue Gabapentin.Continue with HEP as Tolerated and continue to monitor.07/13/2020 10. Chronic Bilateral Thoracic Back Pain: Continue HEP as Tolerated. Continue current medication regimen. Continue to monitor.07/13/2020.  F/U in 1 month

## 2020-07-19 ENCOUNTER — Encounter: Payer: Self-pay | Admitting: "Endocrinology

## 2020-07-19 ENCOUNTER — Ambulatory Visit (INDEPENDENT_AMBULATORY_CARE_PROVIDER_SITE_OTHER): Payer: Medicare Other | Admitting: "Endocrinology

## 2020-07-19 VITALS — BP 136/74 | HR 76 | Ht 64.0 in | Wt 248.2 lb

## 2020-07-19 DIAGNOSIS — E1159 Type 2 diabetes mellitus with other circulatory complications: Secondary | ICD-10-CM

## 2020-07-19 DIAGNOSIS — I2510 Atherosclerotic heart disease of native coronary artery without angina pectoris: Secondary | ICD-10-CM

## 2020-07-19 DIAGNOSIS — E782 Mixed hyperlipidemia: Secondary | ICD-10-CM | POA: Diagnosis not present

## 2020-07-19 DIAGNOSIS — E559 Vitamin D deficiency, unspecified: Secondary | ICD-10-CM | POA: Diagnosis not present

## 2020-07-19 DIAGNOSIS — I10 Essential (primary) hypertension: Secondary | ICD-10-CM

## 2020-07-19 MED ORDER — GLIPIZIDE ER 5 MG PO TB24: 10.0000 mg | ORAL_TABLET | Freq: Every day | ORAL | 3 refills | Status: DC

## 2020-07-19 NOTE — Patient Instructions (Signed)

## 2020-07-19 NOTE — Progress Notes (Signed)
07/19/2020   Endocrinology follow-up note   Subjective:    Patient ID: [name], female    DOB: [birth year]. Patient is returning with his glycemic profile improving from last visit.  She has been on regular follow-up for  management of currently uncontrolled, complicated type 2 diabetes; hyperlipidemia; hypertension.  Past Medical History:  Diagnosis Date  . Anxiety and depression   . Arthritis   . CAD (coronary artery disease)    Stent placement circumflex coronary 2007, catheterization 2008 patent stents. Normal LV function  . Chest pain   . CHF (congestive heart failure) ([place])   . COPD (chronic obstructive pulmonary disease) (H[facility])    no home O2  . Depression   . Diabetes mellitus    Insulin dependent  . Diabetic polyneuropathy (H[facility])     severe on multiple medications  . Dyslipidemia   . GERD (gastroesophageal reflux disease)   . Headache(784.0)   . Heart attack ([place])   . Heartburn   . 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 High cholesterol   . Hx of blood clots   . Hypertension   . MI (myocardial infarction) ([place])    2007  . Obstructive sleep apnea    CPAP, setting ?2  . Palpitations   . Sleep apnea   . Tachycardia    Past Surgical History:  Procedure Laterality Date  . ABDOMINAL HYSTERECTOMY    . Arm Surgery Bilateral    due to fall-pt broke both forearms  . BIOPSY N/A 07/01/2013   Procedure: BIOPSY;  Surgeon: Rogene Houston, MD;  Location: [facility];  Service: Endoscopy;  Laterality: N/A;  . CHOLECYSTECTOMY    . COLONOSCOPY  08/06/2011   Procedure: COLONOSCOPY;  Surgeon: Rogene Houston, MD;  [facility];  Service: Endoscopy;  Laterality: N/A;  215  . COLONOSCOPY WITH PROPOFOL N/A 12/31/2012   Procedure: COLONOSCOPY WITH PROPOFOL;  Surgeon: Rogene Houston, MD;  Location: [facility];  Service: Endoscopy;  Laterality: N/A;  in cecum at 0814, total withdrawal time 78mn  . COLONOSCOPY WITH PROPOFOL N/A 02/05/2018   Procedure: COLONOSCOPY WITH PROPOFOL;   Surgeon: RRogene Houston MD;  [facility];  Service: Endoscopy;  Laterality: N/A;  1:25  . CORONARY ANGIOPLASTY WITH STENT PLACEMENT    . CYSTOSCOPY W/ URETERAL STENT PLACEMENT [facility]10/2019   [facility] . CYSTOSCOPY WITH RETROGRADE PYELOGRAM, URETEROSCOPY AND STENT PLACEMENT Right 12/22/2019   Procedure: CYSTOSCOPY WITH RIGHT URETERAL STENT REMOVAL; RIGHT RETROGRADE PYELOGRAM,RIGHT URETEROSCOPY;  Surgeon: MCleon Gustin MD;  Location: [facility];  Service: Urology;  Laterality: Right;  . ESOPHAGEAL DILATION N/A 06/07/2015   Procedure: ESOPHAGEAL DILATION;  Surgeon: NRogene Houston MD;  [facility];  Service: Endoscopy;  Laterality: N/A;  . ESOPHAGOGASTRODUODENOSCOPY N/A 06/07/2015   Procedure: ESOPHAGOGASTRODUODENOSCOPY (EGD);  Surgeon: NRogene Houston MD;  [facility];  Service: Endoscopy;  Laterality: N/A;  2:45 - moved to 1:00 - Ann to notify pt  . ESOPHAGOGASTRODUODENOSCOPY (EGD) WITH PROPOFOL N/A 12/31/2012   Procedure: ESOPHAGOGASTRODUODENOSCOPY (EGD) WITH PROPOFOL;  Surgeon: NRogene Houston MD;  [facility]: [facility];  Service: Endoscopy;  Laterality: N/A;  GE junction 37,  . ESOPHAGOGASTRODUODENOSCOPY (EGD) WITH PROPOFOL N/A 07/01/2013   Procedure: ESOPHAGOGASTRODUODENOSCOPY (EGD) WITH PROPOFOL;  Surgeon: NRogene Houston MD;  Location: [facility];  Service: Endoscopy;  Laterality: N/A;  950  . MALONEY DILATION N/A 12/31/2012   Procedure: MVenia MinksDILATION;  Surgeon: NRogene Houston MD;  Location:  [facility];  Service: Endoscopy;  Laterality: N/A;  used a # 54,#56  . MALONEY DILATION N/A 07/01/2013   Procedure: Venia Minks DILATION;  Surgeon: Rogene Houston, MD;  Location: [facility];  Service: Endoscopy;  Laterality: N/A;  54/58; no heme present  . POLYPECTOMY  02/05/2018   Procedure: POLYPECTOMY;  Surgeon: Rogene Houston, MD;  [facility];  Service: Endoscopy;;  distal rectum (HS)  . STONE EXTRACTION WITH BASKET Right 12/22/2019   Procedure: STONE  EXTRACTION WITH BASKET;  Surgeon: Cleon Gustin, MD;  Location: [facility];  Service: Urology;  Laterality: Right;   Social History   Socioeconomic History  . Marital status: Divorced    Spouse name: Not on file  . Number of children: 4  . Years of education: Not on file  . Highest education level: Not on file  Occupational History  . Occupation: Disabled  Tobacco Use  . Smoking status: Never Smoker  . Smokeless tobacco: Never Used  Vaping Use  . Vaping Use: Never used  Substance and Sexual Activity  . Alcohol use: No    Alcohol/week: 0.0 standard drinks  . Drug use: No  . Sexual activity: Not on file  Other Topics Concern  . Not on file  Social History Narrative   Patient does not get regular exercise   Denies caffeine use    Social Determinants of Health   Financial Resource Strain: Not on file  Food Insecurity: Not on file  Transportation Needs: Not on file  Physical Activity: Not on file  Stress: Not on file  Social Connections: Not on file   Outpatient Encounter Medications as of 07/19/2020  Medication Sig  . ALPRAZolam (XANAX) 0.5 MG tablet Take 0.5 mg by mouth 2 (two) times daily as needed.  . Armodafinil 150 MG tablet Take 150 mg by mouth daily.  Marland Kitchen aspirin EC 81 MG tablet Take 1 tablet (81 mg total) by mouth daily.  Marland Kitchen atorvastatin (LIPITOR) 40 MG tablet Take 40 mg by mouth daily.  . B-D ULTRAFINE III SHORT PEN 31G X 8 MM MISC Inject into the skin 4 (four) times daily.  Marland Kitchen buPROPion (WELLBUTRIN SR) 100 MG 12 hr tablet Take 100 mg by mouth daily.  . Cholecalciferol (VITAMIN D3) 125 MCG (5000 UT) CAPS TAKE ONE CAPSULE BY MOUTH DAILY (Patient taking differently: Take 5,000 Units by mouth daily.)  . clotrimazole-betamethasone (LOTRISONE) cream Apply to affected area 2 times daily for 21 days  . diclofenac Sodium (VOLTAREN) 1 % GEL APPLY TO BOTH KNEES THREE TIMES DAILY  . diltiazem (CARDIZEM CD) 240 MG 24 hr capsule Take 240 mg by mouth daily.  Marland Kitchen doxepin (SINEQUAN)  25 MG capsule Take 75 mg by mouth at bedtime.   Marland Kitchen estradiol (ESTRACE) 0.5 MG tablet Take 0.5 mg by mouth daily.   . FEROSUL 325 (65 Fe) MG tablet Take 325 mg by mouth every morning.  . furosemide (LASIX) 40 MG tablet Take 1 tablet (40 mg total) by mouth daily.  . furosemide (LASIX) 40 MG tablet Take 1 tablet by mouth daily.  Marland Kitchen gabapentin (NEURONTIN) 400 MG capsule Take 400 mg by mouth 3 (three) times daily.  Marland Kitchen glipiZIDE (GLUCOTROL XL) 5 MG 24 hr tablet Take 2 tablets (10 mg total) by mouth daily with breakfast.  . lisinopril (ZESTRIL) 5 MG tablet Take 1 tablet (5 mg total) by mouth daily.  . meloxicam (MOBIC) 7.5 MG tablet Take 7.5 mg by mouth in the morning and at bedtime.   Marland Kitchen  metFORMIN (GLUCOPHAGE) 500 MG tablet TAKE 1 TABLET BY MOUTH TWICE DAILY (Patient taking differently: Take 500 mg by mouth 2 (two) times daily with a meal.)  . metoCLOPramide (REGLAN) 5 MG tablet Take 5 mg by mouth 3 (three) times daily as needed for nausea or vomiting.   . metoprolol succinate (TOPROL-XL) 100 MG 24 hr tablet Take 100 mg by mouth daily. Take with or immediately following a meal.  . montelukast (SINGULAIR) 10 MG tablet Take 10 mg by mouth at bedtime.   Marland Kitchen morphine (MS CONTIN) 30 MG 12 hr tablet Take 1 tablet (30 mg total) by mouth every 12 (twelve) hours.  . nitroGLYCERIN (NITROSTAT) 0.4 MG SL tablet Place 1 tablet (0.4 mg total) under the tongue every 5 (five) minutes x 3 doses as needed. For chest pains  . NOVOLOG FLEXPEN 100 UNIT/ML FlexPen Inject into the skin.  Marland Kitchen ondansetron (ZOFRAN) 4 MG tablet Take 4 mg by mouth every 6 (six) hours as needed for nausea or vomiting.  . pantoprazole (PROTONIX) 40 MG tablet TAKE 1 TABLET BY MOUTH TWICE DAILY BEFORE MEALS (Patient taking differently: Take 40 mg by mouth 2 (two) times daily before a meal.)  . polyethylene glycol powder (GLYCOLAX/MIRALAX) 17 GM/SCOOP powder Take 1 Container by mouth daily as needed for moderate constipation.   . potassium chloride  (K-DUR,KLOR-CON) 10 MEQ tablet Take 10 mEq by mouth 2 (two) times daily.  Marland Kitchen PROAIR HFA 108 (90 Base) MCG/ACT inhaler Inhale 2 puffs into the lungs every 4 (four) hours as needed for wheezing or shortness of breath.   . promethazine (PHENERGAN) 25 MG tablet Take 1 tablet (25 mg total) by mouth 4 (four) times daily as needed.  . sucralfate (CARAFATE) 1 g tablet Take 1 g by mouth 4 (four) times daily.  . SURE COMFORT INS SYR 1CC/30G 30G X 5/16" 1 ML MISC USE WITH TRESIBA  . topiramate (TOPAMAX) 200 MG tablet Take 200 mg by mouth at bedtime.  . TRESIBA FLEXTOUCH 100 UNIT/ML FlexTouch Pen INJECT 80 UNITS INTO THE SKIN AT BEDTIME  . [DISCONTINUED] glipiZIDE (GLUCOTROL XL) 5 MG 24 hr tablet TAKE 1 TABLET BY MOUTH EVERY DAY WITH BREAKFAST (Patient taking differently: Take 5 mg by mouth daily with breakfast.)   No facility-administered encounter medications on file as of 07/19/2020.   ALLERGIES: Allergies  Allergen Reactions  . Amphetamine-Dextroamphetamine Swelling  . Nitrofuran Derivatives Itching and Swelling  . Amphetamine-Dextroamphet Er Swelling  . Pregabalin Swelling  . Topiramate Other (See Comments)    Tongue tingle  . Verelan [Verapamil] Rash   VACCINATION STATUS:  There is no immunization history on file for this patient.  Diabetes She presents for her follow-up diabetic visit. She has type 2 diabetes mellitus. Onset time: She was diagnosed at approximate age of 75 years. Her disease course has been improving. There are no hypoglycemic associated symptoms. Pertinent negatives for hypoglycemia include no confusion, headaches, pallor or seizures. Pertinent negatives for diabetes include no chest pain, no fatigue, no polydipsia, no polyphagia and no polyuria. There are no hypoglycemic complications. Symptoms are improving. Diabetic complications include autonomic neuropathy, heart disease, peripheral neuropathy, PVD and retinopathy. Risk factors for coronary artery disease include  dyslipidemia, diabetes mellitus, obesity, hypertension and sedentary lifestyle. Current diabetic treatments: Lantus 65 units daily at bedtime, Humalog 15 units 3 times a day before meals. She mentions allergy to Victoza, Byetta, and metformin. Her weight is fluctuating minimally. She is following a generally unhealthy diet. When asked about meal planning, she reported  none. She has not had a previous visit with a dietitian. She never participates in exercise. Her home blood glucose trend is increasing steadily. Her breakfast blood glucose range is generally 140-180 mg/dl. Her bedtime blood glucose range is generally >200 mg/dl. Her overall blood glucose range is >200 mg/dl. (She presents with better fasting glycemic profile, still significantly above target postprandial readings.  Her most recent A1c was 10.4%.  She did not document hypoglycemia.   ) An ACE inhibitor/angiotensin II receptor blocker is not being taken. Eye exam is current.  Hyperlipidemia This is a chronic problem. The current episode started more than 1 year ago. The problem is uncontrolled. Recent lipid tests were reviewed and are high. Exacerbating diseases include diabetes and obesity. Pertinent negatives include no chest pain, myalgias or shortness of breath. Current antihyperlipidemic treatment includes statins. Risk factors for coronary artery disease include diabetes mellitus, dyslipidemia, hypertension, obesity and a sedentary lifestyle.  Hypertension This is a chronic problem. The current episode started more than 1 year ago. Pertinent negatives include no chest pain, headaches, palpitations or shortness of breath. Risk factors for coronary artery disease include diabetes mellitus, dyslipidemia, obesity and sedentary lifestyle. Hypertensive end-organ damage includes CAD/MI, PVD and retinopathy.    Review of systems: Limited as above.  Objective:    BP 136/74   Pulse 76   Ht 5' 4"  (1.626 m)   Wt 248 lb 3.2 oz (112.6 kg)    BMI 42.60 kg/m   Wt Readings from Last 3 Encounters:  07/19/20 248 lb 3.2 oz (112.6 kg)  07/13/20 245 lb (111.1 kg)  07/09/20 251 lb 12.8 oz (114.2 kg)     CMP     Component Value Date/Time   NA 139 07/05/2020 1338   K 4.9 07/05/2020 1338   CL 100 07/05/2020 1338   CO2 23 07/05/2020 1338   GLUCOSE 223 (H) 07/05/2020 1338   GLUCOSE 213 (H) 10/27/2019 1130   BUN 16 07/05/2020 1338   CREATININE 0.64 07/05/2020 1338   CREATININE 0.70 11/02/2018 1105   CALCIUM 9.4 07/05/2020 1338   PROT 6.5 07/05/2020 1338   ALBUMIN 3.6 (L) 07/05/2020 1338   AST 14 07/05/2020 1338   ALT 17 07/05/2020 1338   ALKPHOS 111 07/05/2020 1338   BILITOT 0.4 07/05/2020 1338   GFRNONAA >60 10/27/2019 1130   GFRNONAA 91 11/02/2018 1105   GFRAA >60 10/27/2019 1130   GFRAA 105 11/02/2018 1105   Diabetic Labs (most recent): Lab Results  Component Value Date   HGBA1C 10.4 (A) 07/09/2020   HGBA1C 9.5 02/29/2020   HGBA1C 9.7 (H) 10/27/2019    Lipid Panel     Component Value Date/Time   CHOL 127 07/05/2020 1338   TRIG 241 (H) 07/05/2020 1338   HDL 46 07/05/2020 1338   CHOLHDL 2.8 07/05/2020 1338   CHOLHDL 2.8 07/06/2018 0936   VLDL UNABLE TO CALCULATE IF TRIGLYCERIDE OVER 400 mg/dL 02/18/2017 0528   LDLCALC 43 07/05/2020 1338   LDLCALC 90 07/06/2018 0936     Assessment & Plan:   1. Type 2 diabetes mellitus with vascular disease ([place]) - Patient has currently uncontrolled symptomatic type 2 DM since  70 years of age.  She presents with better fasting glycemic profile, still significantly above target postprandial readings.  Her most recent A1c was 10.4%.  She did not document hypoglycemia.     Recent labs reviewed.  - Her diabetes is complicated by obesity/sedentary life, coronary artery disease status post stent placement and patient remains at  a high risk for more acute and chronic complications of diabetes which include CAD, CVA, CKD, retinopathy, and neuropathy. These are all discussed in  detail with the patient.  - I have counseled the patient on diet management and weight loss, by adopting a carbohydrate restricted/protein rich diet.  - she acknowledges that there is a room for improvement in her food and drink choices. - Suggestion is made for her to avoid simple carbohydrates  from her diet including Cakes, Sweet Desserts, Ice Cream, Soda (diet and regular), Sweet Tea, Candies, Chips, Cookies, Store Bought Juices, Alcohol in Excess of  1-2 drinks a day, Artificial Sweeteners,  Coffee Creamer, and "Sugar-free" Products, Lemonade. This will help patient to have more stable blood glucose profile and potentially avoid unintended weight gain.   - Patient is advised to stick to a routine mealtimes to eat 3 meals  a day and avoid unnecessary snacks ( to snack only to correct hypoglycemia).   - I have approached patient with the following individualized plan to manage diabetes and patient agrees:   -In light of her presentation with significant glycemic burden, she will continue to need at least basal insulin in order for her to maintain control of diabetes to target.  -She is advised to continue Tresiba 80 units nightly associated with monitoring of blood glucose at least twice a day-before breakfast and at bedtime, and any other time as needed.     -She will likely need multiple daily injections of insulin in order for her to achieve control of diabetes to target.   -Her insurance did not provide coverage for CGM device.  If she presents with significantly above target postprandial glycemic profile, she will be considered for rapid acting insulin. She continues to have normal renal function, will continue to benefit from low-dose metformin and glipizide.  She is advised to continue  metformin 500 mg p.o. twice daily, glipizide 5 mg XL p.o. daily at breakfast.   -Patient is encouraged to call clinic for blood glucose levels less than 70 or above 200 mg /dl.  -She has intolerance to  GLP-1 receptor agonists.  2) BP/HTN: -Her blood pressure is controlled to target.  She has adequate medications, advised to continue including metoprolol XL 50 mg p.o. daily.  3) Lipids/HPL: She has uncontrolled hypertriglyceridemia , still high at 439  LDL better at 50, HDL at 77. She is advised to continue Lipitor 40 mg p.o. nightly.   She is educated on the fact that   better control of diabetes will help for triglycerides.   4) Chronic Care/Health Maintenance:  -Patient is on Statin medications and encouraged to continue to follow up with Ophthalmology, Podiatrist at least yearly or according to recommendations, and advised to   stay away from smoking. I have recommended yearly flu vaccine and pneumonia vaccination at least every 5 years;  and  sleep for at least 7 hours a day. -She will benefit from a set of diabetic shoes, paperwork filled for her.   - I advised patient to maintain close follow up with Glenda Chroman, MD for primary care needs.    I spent 32 minutes in the care of the patient today including review of labs from [place], Lipids, Thyroid Function, Hematology (current and previous including abstractions from other facilities); face-to-face time discussing  her blood glucose readings/logs, discussing hypoglycemia and hyperglycemia episodes and symptoms, medications doses, her options of short and long term treatment based on the latest standards of care / guidelines;  discussion about incorporating lifestyle medicine;  and documenting the encounter.    Please refer to Patient Instructions for Blood Glucose Monitoring and Insulin/Medications Dosing Guide"  in media tab for additional information. Please  also refer to " Patient Self Inventory" in the Media  tab for reviewed elements of pertinent patient history.  [name] participated in the discussions, expressed understanding, and voiced agreement with the above plans.  All questions were answered to her satisfaction. she  is encouraged to contact clinic should she have any questions or concerns prior to her return visit.    Follow up plan: - Patient to go to emergency room for better evaluation of chest pain. Return in about 3 months (around 10/23/2020) for Bring Meter and Logs- A1c in Office.  Glade Lloyd, MD Phone: [phone]  Fax: [phone]  This note was partially dictated with voice recognition software. Similar sounding words can be transcribed inadequately or may not  be corrected upon review.  07/19/2020, 3:29 PM

## 2020-07-23 ENCOUNTER — Telehealth: Payer: Self-pay

## 2020-07-23 DIAGNOSIS — E1159 Type 2 diabetes mellitus with other circulatory complications: Secondary | ICD-10-CM

## 2020-07-23 MED ORDER — TRESIBA FLEXTOUCH 100 UNIT/ML ~~LOC~~ SOPN: 80.0000 [IU] | PEN_INJECTOR | Freq: Every day | SUBCUTANEOUS | 0 refills | Status: DC

## 2020-07-23 NOTE — Telephone Encounter (Signed)
Patient requesting a refill on her  TRESIBA FLEXTOUCH 100 UNIT/ML FlexTouch Pen  Please send to [facility], [place]

## 2020-07-23 NOTE — Telephone Encounter (Signed)
Rx sent 

## 2020-08-02 ENCOUNTER — Telehealth: Payer: Self-pay

## 2020-08-02 NOTE — Telephone Encounter (Signed)
Pt notified and agrees. 

## 2020-08-02 NOTE — Telephone Encounter (Signed)
Advise her to increase Tresiba by 10 units , to 90 units qhs.

## 2020-08-02 NOTE — Telephone Encounter (Signed)
Readings:  5/16- 192 breakfast, 259 lunch, did not take it in the evening 5/17- 202 breakfast, 244 lunch, did not take it in the evening 5/18- 201 breakfast, 244 lunch, did not take it in the evening 5/19- 230 breakfast, 286  Please advise.

## 2020-08-07 ENCOUNTER — Observation Stay (HOSPITAL_COMMUNITY)
Admission: EM | Admit: 2020-08-07 | Discharge: 2020-08-08 | Disposition: A | Discharge status: Home / Self Care | Payer: Medicare Other | Attending: Cardiology | Admitting: Cardiology

## 2020-08-07 ENCOUNTER — Emergency Department (HOSPITAL_COMMUNITY): Payer: Medicare Other

## 2020-08-07 ENCOUNTER — Other Ambulatory Visit: Payer: Self-pay

## 2020-08-07 ENCOUNTER — Ambulatory Visit (INDEPENDENT_AMBULATORY_CARE_PROVIDER_SITE_OTHER): Payer: Medicare Other | Admitting: Cardiology

## 2020-08-07 ENCOUNTER — Encounter (HOSPITAL_COMMUNITY): Payer: Self-pay | Admitting: *Deleted

## 2020-08-07 VITALS — BP 142/70 | HR 87 | Ht 64.0 in | Wt 245.0 lb

## 2020-08-07 DIAGNOSIS — G4733 Obstructive sleep apnea (adult) (pediatric): Secondary | ICD-10-CM | POA: Diagnosis present

## 2020-08-07 DIAGNOSIS — I4891 Unspecified atrial fibrillation: Secondary | ICD-10-CM | POA: Insufficient documentation

## 2020-08-07 DIAGNOSIS — E1142 Type 2 diabetes mellitus with diabetic polyneuropathy: Secondary | ICD-10-CM | POA: Diagnosis present

## 2020-08-07 DIAGNOSIS — Z79899 Other long term (current) drug therapy: Secondary | ICD-10-CM | POA: Diagnosis not present

## 2020-08-07 DIAGNOSIS — I25118 Atherosclerotic heart disease of native coronary artery with other forms of angina pectoris: Principal | ICD-10-CM | POA: Insufficient documentation

## 2020-08-07 DIAGNOSIS — R0789 Other chest pain: Secondary | ICD-10-CM | POA: Diagnosis present

## 2020-08-07 DIAGNOSIS — I2510 Atherosclerotic heart disease of native coronary artery without angina pectoris: Secondary | ICD-10-CM | POA: Diagnosis present

## 2020-08-07 DIAGNOSIS — I110 Hypertensive heart disease with heart failure: Secondary | ICD-10-CM | POA: Insufficient documentation

## 2020-08-07 DIAGNOSIS — E119 Type 2 diabetes mellitus without complications: Secondary | ICD-10-CM | POA: Insufficient documentation

## 2020-08-07 DIAGNOSIS — Z20822 Contact with and (suspected) exposure to covid-19: Secondary | ICD-10-CM | POA: Insufficient documentation

## 2020-08-07 DIAGNOSIS — Z7982 Long term (current) use of aspirin: Secondary | ICD-10-CM | POA: Insufficient documentation

## 2020-08-07 DIAGNOSIS — Z794 Long term (current) use of insulin: Secondary | ICD-10-CM | POA: Insufficient documentation

## 2020-08-07 DIAGNOSIS — I200 Unstable angina: Secondary | ICD-10-CM | POA: Insufficient documentation

## 2020-08-07 DIAGNOSIS — G8929 Other chronic pain: Secondary | ICD-10-CM | POA: Diagnosis present

## 2020-08-07 DIAGNOSIS — I5032 Chronic diastolic (congestive) heart failure: Secondary | ICD-10-CM | POA: Diagnosis not present

## 2020-08-07 DIAGNOSIS — I25110 Atherosclerotic heart disease of native coronary artery with unstable angina pectoris: Secondary | ICD-10-CM | POA: Diagnosis not present

## 2020-08-07 DIAGNOSIS — Z7984 Long term (current) use of oral hypoglycemic drugs: Secondary | ICD-10-CM | POA: Diagnosis not present

## 2020-08-07 DIAGNOSIS — E1159 Type 2 diabetes mellitus with other circulatory complications: Secondary | ICD-10-CM | POA: Diagnosis present

## 2020-08-07 DIAGNOSIS — M545 Low back pain, unspecified: Secondary | ICD-10-CM | POA: Diagnosis present

## 2020-08-07 DIAGNOSIS — F32A Depression, unspecified: Secondary | ICD-10-CM | POA: Diagnosis present

## 2020-08-07 DIAGNOSIS — F419 Anxiety disorder, unspecified: Secondary | ICD-10-CM | POA: Diagnosis present

## 2020-08-07 DIAGNOSIS — J449 Chronic obstructive pulmonary disease, unspecified: Secondary | ICD-10-CM | POA: Insufficient documentation

## 2020-08-07 DIAGNOSIS — E782 Mixed hyperlipidemia: Secondary | ICD-10-CM | POA: Diagnosis present

## 2020-08-07 LAB — BASIC METABOLIC PANEL
Anion gap: 8 (ref 5–15)
BUN: 15 mg/dL (ref 8–23)
CO2: 29 mmol/L (ref 22–32)
Calcium: 9.1 mg/dL (ref 8.9–10.3)
Chloride: 101 mmol/L (ref 98–111)
Creatinine, Ser: 0.68 mg/dL (ref 0.44–1.00)
GFR, Estimated: >60 mL/min (ref >60)
Glucose, Bld: 184 mg/dL — ABNORMAL HIGH (ref 70–99)
Potassium: 4.2 mmol/L (ref 3.5–5.1)
Sodium: 138 mmol/L (ref 135–145)

## 2020-08-07 LAB — CBC
HCT: 40.6 % (ref 36.0–46.0)
Hemoglobin: 11.8 g/dL — ABNORMAL LOW (ref 12.0–15.0)
MCH: 21.9 pg — ABNORMAL LOW (ref 26.0–34.0)
MCHC: 29.1 g/dL — ABNORMAL LOW (ref 30.0–36.0)
MCV: 75.3 fL — ABNORMAL LOW (ref 80.0–100.0)
Platelets: 411 10*3/uL — ABNORMAL HIGH (ref 150–400)
RBC: 5.39 MIL/uL — ABNORMAL HIGH (ref 3.87–5.11)
RDW: 16.1 % — ABNORMAL HIGH (ref 11.5–15.5)
WBC: 11.6 10*3/uL — ABNORMAL HIGH (ref 4.0–10.5)
nRBC: 0 % (ref 0.0–0.2)

## 2020-08-07 LAB — RESP PANEL BY RT-PCR (FLU A&B, COVID) ARPGX2
Influenza A by PCR: NEGATIVE
Influenza B by PCR: NEGATIVE
SARS Coronavirus 2 by RT PCR: NEGATIVE

## 2020-08-07 LAB — TROPONIN I (HIGH SENSITIVITY): Troponin I (High Sensitivity): 5 ng/L (ref <18)

## 2020-08-07 MED ORDER — ALPRAZOLAM 0.5 MG PO TABS
0.5000 mg | ORAL_TABLET | Freq: Once | ORAL | Status: AC
  Administered 2020-08-07: 0.5 mg via ORAL
  Filled 2020-08-07: qty 1

## 2020-08-07 NOTE — Patient Instructions (Signed)
Medication Instructions:  Continue all current medications.  Labwork: none  Testing/Procedures: none  Follow-Up: 3 weeks   Any Other Special Instructions Will Be Listed Below (If Applicable). Per [place] - go to ED - [facility] now for evaluation of chest pain.   If you need a refill on your cardiac medications before your next appointment, please call your pharmacy.

## 2020-08-07 NOTE — ED Provider Notes (Signed)
[facility] Provider Note   CSN: [id number] Arrival date & time: 08/07/20  1745     History Chief Complaint  Patient presents with  . Chest Pain    [name] is a 70 y.o. female.  70 year old female with history of coronary artery disease with stent, CHF, COPD, diabetes, prior MI, hyperlipidemia, A. fib, additional history as listed below, sent by cardiology with concern for unstable angina.  Patient reports chest tightness for the past 4 days, intermittent, with shortness of breath, progressively worsening.  Also reports nausea and diaphoresis.  Pain somewhat improved with nitroglycerin.  Pain occurs at rest and with exertion.  Patient went to [facility] ED yesterday however left due to extensive wait, had 1 negative troponin.        Past Medical History:  Diagnosis Date  . Anxiety and depression   . Arthritis   . CAD (coronary artery disease)    Stent placement circumflex coronary 2007, catheterization 2008 patent stents. Normal LV function  . Chest pain   . CHF (congestive heart failure) ([place])   . COPD (chronic obstructive pulmonary disease) (HCC)    no home O2  . Depression   . Diabetes mellitus    Insulin dependent  . Diabetic polyneuropathy (HCC)     severe on multiple medications  . Dyslipidemia   . GERD (gastroesophageal reflux disease)   . Headache(784.0)   . Heart attack ([place])   . Heartburn   . 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 High cholesterol   . Hx of blood clots   . Hypertension   . MI (myocardial infarction) ([place])    2007  . Obstructive sleep apnea    CPAP, setting ?2  . Palpitations   . Sleep apnea   . Tachycardia     Patient Active Problem List   Diagnosis Date Noted  . Nephrolithiasis 10/10/2019  . Vitamin D deficiency 07/08/2018  . History of colonic polyps 01/06/2018  . Family hx of colon cancer 01/06/2018  . Uncontrolled type 2 diabetes mellitus with hyperglycemia ([employer]) 10/07/2017  . Primary osteoarthritis of left  knee 05/15/2017  . Primary osteoarthritis of right knee 05/15/2017  . Chest pain 02/17/2017  . Essential hypertension, benign 05/14/2015  . Morbid obesity due to excess calories ([place]) 05/02/2015  . Dysphagia 04/24/2015  . Arthrosis of right acromioclavicular joint 11/13/2014  . Spondylosis of lumbar region without myelopathy or radiculopathy 11/13/2014  . Painful diabetic neuropathy ([place]) 06/26/2014  . Diarrhea 03/02/2013  . Rectal irritation 03/02/2013  . Chronic low back pain without sciatica 07/29/2012  . Muscle weakness (generalized) 07/29/2012  . Spinal stenosis, lumbar region, without neurogenic claudication 07/19/2012  . Irritable bowel syndrome 08/24/2011  . Pancreatitis 10/17/2010  . CAD (coronary artery disease)   . Type 2 diabetes mellitus with vascular disease ([place])   . Diabetic polyneuropathy ([place])   . Anxiety and depression   . Mixed hyperlipidemia 09/20/2009  . Chronic diastolic heart failure ([place]) 09/20/2009  . OBSTRUCTIVE SLEEP APNEA 09/04/2009  . TACHYCARDIA 11/08/2008  . PALPITATIONS 11/08/2008  . CHEST PAIN-UNSPECIFIED 11/08/2008    Past Surgical History:  Procedure Laterality Date  . ABDOMINAL HYSTERECTOMY    . Arm Surgery Bilateral    due to fall-pt broke both forearms  . BIOPSY N/A 07/01/2013   Procedure: BIOPSY;  Surgeon: Rogene Houston, MD;  Location: [facility] ORS;  Service: Endoscopy;  Laterality: N/A;  . CHOLECYSTECTOMY    . COLONOSCOPY  08/06/2011   Procedure: COLONOSCOPY;  Surgeon:  Rogene Houston, MD;  Location: [facility] SU[facility];  Service: Endoscopy;  Laterality: N/A;  215  . COLONOSCOPY WITH PROPOFOL N/A 12/31/2012   Procedure: COLONOSCOPY WITH PROPOFOL;  Surgeon: Rogene Houston, MD;  Location: [facility];  Service: Endoscopy;  Laterality: N/A;  in cecum at 0814, total withdrawal time 40mn  . COLONOSCOPY WITH PROPOFOL N/A 02/05/2018   Procedure: COLONOSCOPY WITH PROPOFOL;  Surgeon: RRogene Houston MD;  Location: [facility];  Service: Endoscopy;   Laterality: N/A;  1:25  . CORONARY ANGIOPLASTY WITH STENT PLACEMENT    . CYSTOSCOPY W/ URETERAL STENT PLACEMENT Right 11/10/2019   [facility] . CYSTOSCOPY WITH RETROGRADE PYELOGRAM, URETEROSCOPY AND STENT PLACEMENT Right 12/22/2019   Procedure: CYSTOSCOPY WITH RIGHT URETERAL STENT REMOVAL; RIGHT RETROGRADE PYELOGRAM,RIGHT URETEROSCOPY;  Surgeon: MCleon Gustin MD;  Location: [facility] ORS;  Service: Urology;  Laterality: Right;  . ESOPHAGEAL DILATION N/A 06/07/2015   Procedure: ESOPHAGEAL DILATION;  Surgeon: NRogene Houston MD;  Location: [facility];  Service: Endoscopy;  Laterality: N/A;  . ESOPHAGOGASTRODUODENOSCOPY N/A 06/07/2015   Procedure: ESOPHAGOGASTRODUODENOSCOPY (EGD);  Surgeon: NRogene Houston MD;  [facility];  Service: Endoscopy;  Laterality: N/A;  2:45 - moved to 1:00 - Ann to notify pt  . ESOPHAGOGASTRODUODENOSCOPY (EGD) WITH PROPOFOL N/A 12/31/2012   Procedure: ESOPHAGOGASTRODUODENOSCOPY (EGD) WITH PROPOFOL;  Surgeon: NRogene Houston MD;  Location: [facility];  Service: Endoscopy;  Laterality: N/A;  GE junction 37,  . ESOPHAGOGASTRODUODENOSCOPY (EGD) WITH PROPOFOL N/A 07/01/2013   Procedure: ESOPHAGOGASTRODUODENOSCOPY (EGD) WITH PROPOFOL;  Surgeon: NRogene Houston MD;  Location: [facility];  Service: Endoscopy;  Laterality: N/A;  950  . MALONEY DILATION N/A 12/31/2012   Procedure: MALONEY DILATION;  Surgeon: NRogene Houston MD;  Location: [facility];  Service: Endoscopy;  Laterality: N/A;  used a # 54,#56  . MALONEY DILATION N/A 07/01/2013   Procedure: MVenia MinksDILATION;  Surgeon: NRogene Houston MD;  Location: [facility];  Service: Endoscopy;  Laterality: N/A;  54/58; no heme present  . POLYPECTOMY  02/05/2018   Procedure: POLYPECTOMY;  Surgeon: RRogene Houston MD;  [facility];  Service: Endoscopy;;  distal rectum (HS)  . STONE EXTRACTION WITH BASKET Right 12/22/2019   Procedure: STONE EXTRACTION WITH BASKET;  Surgeon: MCleon Gustin MD;  Location: [facility];   Service: Urology;  Laterality: Right;     OB History   No obstetric history on file.     Family History  Problem Relation Age of Onset  . Heart attack Mother   . Colon cancer Mother   . Heart attack Father   . Stroke Sister   . Hemophilia Child   . Cancer Other   . Coronary artery disease Other   . Cancer Brother   . Cancer Maternal Aunt   . Cancer Maternal Uncle   . Cancer Paternal Aunt   . Cancer Paternal Uncle   . Cancer Brother     Social History   Tobacco Use  . Smoking status: Never Smoker  . Smokeless tobacco: Never Used  Vaping Use  . Vaping Use: Never used  Substance Use Topics  . Alcohol use: No    Alcohol/week: 0.0 standard drinks  . Drug use: No    Home Medications Prior to Admission medications   Medication Sig Start Date End Date Taking? Authorizing Provider  ALPRAZolam (Duanne Moron 0.5 MG tablet Take 0.5 mg by mouth 2 (two) times daily as needed. 01/13/20   [provider]  Armodafinil 150 MG tablet Take 150 mg by mouth daily. 12/23/19   [provider]  aspirin EC 81 MG tablet Take 1 tablet (81 mg total) by mouth daily. 02/12/18   Rehman, Mechele Dawley, MD  atorvastatin (LIPITOR) 40 MG tablet Take 40 mg by mouth daily.    [provider]  B-D ULTRAFINE III SHORT PEN 31G X 8 MM MISC Inject into the skin 4 (four) times daily. 08/26/19   [provider]  buPROPion (WELLBUTRIN SR) 100 MG 12 hr tablet Take 100 mg by mouth daily. 04/23/20   [provider]  Cholecalciferol (VITAMIN D3) 125 MCG (5000 UT) CAPS TAKE ONE CAPSULE BY MOUTH DAILY Patient taking differently: Take 5,000 Units by mouth daily. 04/04/19   Cassandria Anger, MD  clotrimazole-betamethasone (LOTRISONE) cream Apply to affected area 2 times daily for 21 days 12/22/19 12/21/20  Cleon Gustin, MD  diclofenac Sodium (VOLTAREN) 1 % GEL APPLY TO BOTH KNEES THREE TIMES DAILY 01/13/20   Kirsteins, Luanna Salk, MD  diltiazem (CARDIZEM CD) 240 MG 24 hr capsule  Take 240 mg by mouth daily. 11/15/19   [provider]  doxepin (SINEQUAN) 25 MG capsule Take 75 mg by mouth at bedtime.  09/23/19   [provider]  estradiol (ESTRACE) 0.5 MG tablet Take 0.5 mg by mouth daily.  09/23/19   [provider]  FEROSUL 325 (65 Fe) MG tablet Take 325 mg by mouth every morning. 11/15/19   [provider]  furosemide (LASIX) 40 MG tablet Take 1 tablet (40 mg total) by mouth daily. 05/07/20 08/05/20  Arnoldo Lenis, MD  furosemide (LASIX) 40 MG tablet Take 1 tablet by mouth daily. 05/07/20 08/05/20  [provider]  gabapentin (NEURONTIN) 400 MG capsule Take 400 mg by mouth 3 (three) times daily. 11/24/16   [provider]  glipiZIDE (GLUCOTROL XL) 5 MG 24 hr tablet Take 2 tablets (10 mg total) by mouth daily with breakfast. 07/19/20   Nida, Marella Chimes, MD  lisinopril (ZESTRIL) 5 MG tablet Take 1 tablet (5 mg total) by mouth daily. 06/12/20   Arnoldo Lenis, MD  meloxicam (MOBIC) 7.5 MG tablet Take 7.5 mg by mouth in the morning and at bedtime.  02/10/18   Rehman, Mechele Dawley, MD  metFORMIN (GLUCOPHAGE) 500 MG tablet TAKE 1 TABLET BY MOUTH TWICE DAILY Patient taking differently: Take 500 mg by mouth 2 (two) times daily with a meal. 03/01/19   Nida, Marella Chimes, MD  metoCLOPramide (REGLAN) 5 MG tablet Take 5 mg by mouth 3 (three) times daily as needed for nausea or vomiting.  11/09/17   [provider]  metoprolol succinate (TOPROL-XL) 100 MG 24 hr tablet Take 100 mg by mouth daily. Take with or immediately following a meal.    [provider]  montelukast (SINGULAIR) 10 MG tablet Take 10 mg by mouth at bedtime.  09/23/19   [provider]  morphine (MS CONTIN) 30 MG 12 hr tablet Take 1 tablet (30 mg total) by mouth every 12 (twelve) hours. 07/13/20   Bayard Hugger, NP  nitroGLYCERIN (NITROSTAT) 0.4 MG SL tablet Place 1 tablet (0.4 mg total) under the tongue every 5 (five) minutes x 3 doses as  needed. For chest pains 10/11/13   Herminio Commons, MD  NOVOLOG FLEXPEN 100 UNIT/ML FlexPen Inject into the skin. 01/05/20   [provider]  ondansetron (ZOFRAN) 4 MG tablet Take 4 mg by mouth every 6 (six) hours as  needed for nausea or vomiting.    [provider]  pantoprazole (PROTONIX) 40 MG tablet TAKE 1 TABLET BY MOUTH TWICE DAILY BEFORE MEALS Patient taking differently: Take 40 mg by mouth 2 (two) times daily before a meal. 06/29/19   Rehman, Mechele Dawley, MD  polyethylene glycol powder (GLYCOLAX/MIRALAX) 17 GM/SCOOP powder Take 1 Container by mouth daily as needed for moderate constipation.  08/10/19   [provider]  potassium chloride (K-DUR,KLOR-CON) 10 MEQ tablet Take 10 mEq by mouth 2 (two) times daily. 11/24/16   [provider]  PROAIR HFA 108 (90 Base) MCG/ACT inhaler Inhale 2 puffs into the lungs every 4 (four) hours as needed for wheezing or shortness of breath.  11/24/16   [provider]  promethazine (PHENERGAN) 25 MG tablet Take 1 tablet (25 mg total) by mouth 4 (four) times daily as needed. 12/29/19   McKenzie, Candee Furbish, MD  sucralfate (CARAFATE) 1 g tablet Take 1 g by mouth 4 (four) times daily. 12/26/19   [provider]  SURE COMFORT INS SYR 1CC/30G 30G X 5/16" 1 ML MISC USE WITH TRESIBA 01/25/20   [provider]  topiramate (TOPAMAX) 200 MG tablet Take 200 mg by mouth at bedtime. 11/24/16   [provider]  TRESIBA FLEXTOUCH 100 UNIT/ML FlexTouch Pen Inject 80 Units into the skin at bedtime. 07/23/20   Cassandria Anger, MD    Allergies    Amphetamine-dextroamphetamine, Nitrofuran derivatives, Amphetamine-dextroamphet er, Pregabalin, Topiramate, and Verelan [verapamil]  Review of Systems   Review of Systems  Constitutional: Positive for diaphoresis. Negative for fever.  Respiratory: Positive for chest tightness and shortness of breath.   Cardiovascular: Positive for chest pain.   Gastrointestinal: Positive for nausea. Negative for abdominal pain and vomiting.  Musculoskeletal: Negative for arthralgias and myalgias.  Skin: Negative for rash and wound.  Allergic/Immunologic: Positive for immunocompromised state.  Neurological: Positive for weakness.  All other systems reviewed and are negative.   Physical Exam Updated Vital Signs BP (!) 147/74   Pulse 92   Temp 99.7 F (37.6 C) (Oral)   Resp 18   Ht 5' 4"  (1.626 m)   Wt 102.5 kg   SpO2 94%   BMI 38.79 kg/m   Physical Exam Vitals and nursing note reviewed.  Constitutional:      Appearance: She is well-developed. She is obese. She is diaphoretic.     Comments: Appears to feel unwell  HENT:     Head: Normocephalic and atraumatic.  Cardiovascular:     Rate and Rhythm: Normal rate and regular rhythm.     Heart sounds: Normal heart sounds. No murmur heard.   Pulmonary:     Effort: Pulmonary effort is normal.     Breath sounds: Normal breath sounds.  Chest:     Chest wall: Tenderness present.  Abdominal:     Palpations: Abdomen is soft.     Tenderness: There is no abdominal tenderness.  Musculoskeletal:     Right lower leg: Edema present.     Left lower leg: Edema present.  Skin:    General: Skin is warm.     Findings: No rash.  Neurological:     Mental Status: She is alert and oriented to person, place, and time.  Psychiatric:        Behavior: Behavior normal.     ED Results / Procedures / Treatments   Labs (all labs ordered are listed, but only abnormal results are displayed) Labs Reviewed  BASIC METABOLIC PANEL -  Abnormal; Notable for the following components:      Result Value   Glucose, Bld 184 (*)    All other components within normal limits  CBC - Abnormal; Notable for the following components:   WBC 11.6 (*)    RBC 5.39 (*)    Hemoglobin 11.8 (*)    MCV 75.3 (*)    MCH 21.9 (*)    MCHC 29.1 (*)    RDW 16.1 (*)    Platelets 411 (*)    All other components within normal  limits  RESP PANEL BY RT-PCR (FLU A&B, COVID) ARPGX2  TROPONIN I (HIGH SENSITIVITY)  TROPONIN I (HIGH SENSITIVITY)    EKG EKG Interpretation  Date/Time:  Tuesday Aug 07 2020 17:55:51 EDT Ventricular Rate:  91 PR Interval:  182 QRS Duration: 74 QT Interval:  374 QTC Calculation: 460 R Axis:   9 Text Interpretation: Normal sinus rhythm Normal ECG Confirmed by Noemi Chapel [phone]) on 08/07/2020 7:21:19 PM   Radiology DG Chest Portable 1 View  Result Date: 08/07/2020 CLINICAL DATA:  Chest pain and shortness of breath. EXAM: PORTABLE CHEST 1 VIEW COMPARISON:  Radiograph yesterday at [facility]  CT 06/19/2020 FINDINGS: Upper normal heart size.The cardiomediastinal contours are normal. The lungs are clear. Pulmonary vasculature is normal. No consolidation, pleural effusion, or pneumothorax. No acute osseous abnormalities are seen. IMPRESSION: No acute chest findings.  No change from radiographs yesterday. Electronically Signed   By: Keith Rake M.D.   On: 08/07/2020 18:22    Procedures Procedures   Medications Ordered in ED Medications - No data to display  ED Course  I have reviewed the triage vital signs and the nursing notes.  Pertinent labs & imaging results that were available during my care of the patient were reviewed by me and considered in my medical decision making (see chart for details).  Clinical Course as of 08/07/20 2115  Tue Aug 07, 1148  2411 70 year old female sent by cardiology today for concern for unstable angina with left-sided chest pressure with shortness of breath intermittent for the past 3 to 4 days.  On arrival, patient appears uncomfortable, she is diaphoretic with chest wall tenderness and lower extremity edema. EKG without acute ischemic changes, troponin is 5.  CBC and BMP without significant changes from priors.  Case discussed with Dr. Marcelle Smiling with cardiology who accepts patient in transfer to [facility].  [LM]    Clinical Course User  Index [LM] Roque Lias   MDM Rules/Calculators/A&P                          Final Clinical Impression(s) / ED Diagnoses Final diagnoses:  Unstable angina [facility])    Rx / DC Orders ED Discharge Orders    None       Roque Lias 08/07/20 2115    Noemi Chapel, MD 08/08/20 [phone]

## 2020-08-07 NOTE — ED Notes (Signed)
Pt stating that she is having a "panic attack" and that she has been out of her medication for a few days. EDP made aware and to order medication.

## 2020-08-07 NOTE — ED Triage Notes (Signed)
Pt with CP for 3 days on the left.  Seen cardiologist in [place] and sent to ED.  + nausea + sob

## 2020-08-07 NOTE — Progress Notes (Signed)
Clinical Summary [name] is a 70 y.o.female seen today for follow up of the following medical problems. This is a focused visit for her history of CAD and recent chest pain.    1. CAD -history of MI in 2007, had PCI to LCX   - 3-4 days of chest pain. Had been ongoing for few - pressure. Under left breast. 7/10 in severity. Can occur at rest or with exertion. Worst with deep breathing. +SOB Hot sweaty, nauseous. Pain lasts 15-20 min, longest is 30 minutes - increasing in frequency, occurring few times a day. Somewhat better with NG. - some DOE which is new  - presented to [facility] ER yesterday with chest pain. DId not complete workup due to very long wait - bp 201/95  - CXR no acute process - trop neg   2. Chronic diastolic HF -   3. OSA   4. Afib - 10/2019 admitted to [facility] - UTI with sepsis, had some afib during that admission. No prior history.  - was started on eliquis at the time but Rx ran out, no longer taking.  04/2020 30 day monitor: no arrhythmias Has not been committed to long term anticoag   5. HTN - compliant with meds Past Medical History:  Diagnosis Date  . Anxiety and depression   . Arthritis   . CAD (coronary artery disease)    Stent placement circumflex coronary 2007, catheterization 2008 patent stents. Normal LV function  . Chest pain   . CHF (congestive heart failure) ([place])   . COPD (chronic obstructive pulmonary disease) (HCC)    no home O2  . Depression   . Diabetes mellitus    Insulin dependent  . Diabetic polyneuropathy (HCC)     severe on multiple medications  . Dyslipidemia   . GERD (gastroesophageal reflux disease)   . Headache(784.0)   . Heart attack ([place])   . Heartburn   . 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 High cholesterol   . Hx of blood clots   . Hypertension   . MI (myocardial infarction) ([place])    2007  . Obstructive sleep apnea    CPAP, setting ?2  . Palpitations   . Sleep apnea   . Tachycardia       Allergies  Allergen Reactions  . Amphetamine-Dextroamphetamine Swelling  . Nitrofuran Derivatives Itching and Swelling  . Amphetamine-Dextroamphet Er Swelling  . Pregabalin Swelling  . Topiramate Other (See Comments)    Tongue tingle  . Verelan [Verapamil] Rash     Current Outpatient Medications  Medication Sig Dispense Refill  . ALPRAZolam (XANAX) 0.5 MG tablet Take 0.5 mg by mouth 2 (two) times daily as needed.    . Armodafinil 150 MG tablet Take 150 mg by mouth daily.    Marland Kitchen aspirin EC 81 MG tablet Take 1 tablet (81 mg total) by mouth daily.    Marland Kitchen atorvastatin (LIPITOR) 40 MG tablet Take 40 mg by mouth daily.    . B-D ULTRAFINE III SHORT PEN 31G X 8 MM MISC Inject into the skin 4 (four) times daily.    Marland Kitchen buPROPion (WELLBUTRIN SR) 100 MG 12 hr tablet Take 100 mg by mouth daily.    . Cholecalciferol (VITAMIN D3) 125 MCG (5000 UT) CAPS TAKE ONE CAPSULE BY MOUTH DAILY (Patient taking differently: Take 5,000 Units by mouth daily.) 90 capsule 0  . clotrimazole-betamethasone (LOTRISONE) cream Apply to affected area 2 times daily for 21 days 15 g 1  .  diclofenac Sodium (VOLTAREN) 1 % GEL APPLY TO BOTH KNEES THREE TIMES DAILY 300 g 2  . diltiazem (CARDIZEM CD) 240 MG 24 hr capsule Take 240 mg by mouth daily.    Marland Kitchen doxepin (SINEQUAN) 25 MG capsule Take 75 mg by mouth at bedtime.     Marland Kitchen estradiol (ESTRACE) 0.5 MG tablet Take 0.5 mg by mouth daily.     . FEROSUL 325 (65 Fe) MG tablet Take 325 mg by mouth every morning.    . furosemide (LASIX) 40 MG tablet Take 1 tablet (40 mg total) by mouth daily. 90 tablet 3  . furosemide (LASIX) 40 MG tablet Take 1 tablet by mouth daily.    Marland Kitchen gabapentin (NEURONTIN) 400 MG capsule Take 400 mg by mouth 3 (three) times daily.  5  . glipiZIDE (GLUCOTROL XL) 5 MG 24 hr tablet Take 2 tablets (10 mg total) by mouth daily with breakfast. 60 tablet 3  . lisinopril (ZESTRIL) 5 MG tablet Take 1 tablet (5 mg total) by mouth daily. 90 tablet 2  . meloxicam (MOBIC)  7.5 MG tablet Take 7.5 mg by mouth in the morning and at bedtime.   4  . metFORMIN (GLUCOPHAGE) 500 MG tablet TAKE 1 TABLET BY MOUTH TWICE DAILY (Patient taking differently: Take 500 mg by mouth 2 (two) times daily with a meal.) 60 tablet 3  . metoCLOPramide (REGLAN) 5 MG tablet Take 5 mg by mouth 3 (three) times daily as needed for nausea or vomiting.   1  . metoprolol succinate (TOPROL-XL) 100 MG 24 hr tablet Take 100 mg by mouth daily. Take with or immediately following a meal.    . montelukast (SINGULAIR) 10 MG tablet Take 10 mg by mouth at bedtime.     Marland Kitchen morphine (MS CONTIN) 30 MG 12 hr tablet Take 1 tablet (30 mg total) by mouth every 12 (twelve) hours. 60 tablet 0  . nitroGLYCERIN (NITROSTAT) 0.4 MG SL tablet Place 1 tablet (0.4 mg total) under the tongue every 5 (five) minutes x 3 doses as needed. For chest pains 25 tablet 3  . NOVOLOG FLEXPEN 100 UNIT/ML FlexPen Inject into the skin.    Marland Kitchen ondansetron (ZOFRAN) 4 MG tablet Take 4 mg by mouth every 6 (six) hours as needed for nausea or vomiting.    . pantoprazole (PROTONIX) 40 MG tablet TAKE 1 TABLET BY MOUTH TWICE DAILY BEFORE MEALS (Patient taking differently: Take 40 mg by mouth 2 (two) times daily before a meal.) 60 tablet 1  . polyethylene glycol powder (GLYCOLAX/MIRALAX) 17 GM/SCOOP powder Take 1 Container by mouth daily as needed for moderate constipation.     . potassium chloride (K-DUR,KLOR-CON) 10 MEQ tablet Take 10 mEq by mouth 2 (two) times daily.  12  . PROAIR HFA 108 (90 Base) MCG/ACT inhaler Inhale 2 puffs into the lungs every 4 (four) hours as needed for wheezing or shortness of breath.   8  . promethazine (PHENERGAN) 25 MG tablet Take 1 tablet (25 mg total) by mouth 4 (four) times daily as needed. 30 tablet 0  . sucralfate (CARAFATE) 1 g tablet Take 1 g by mouth 4 (four) times daily.    . SURE COMFORT INS SYR 1CC/30G 30G X 5/16" 1 ML MISC USE WITH TRESIBA    . topiramate (TOPAMAX) 200 MG tablet Take 200 mg by mouth at bedtime.   3  . TRESIBA FLEXTOUCH 100 UNIT/ML FlexTouch Pen Inject 80 Units into the skin at bedtime. 72 mL 0   No current  facility-administered medications for this visit.     Past Surgical History:  Procedure Laterality Date  . ABDOMINAL HYSTERECTOMY    . Arm Surgery Bilateral    due to fall-pt broke both forearms  . BIOPSY N/A 07/01/2013   Procedure: BIOPSY;  Surgeon: Rogene Houston, MD;  Location: [facility];  Service: Endoscopy;  Laterality: N/A;  . CHOLECYSTECTOMY    . COLONOSCOPY  08/06/2011   Procedure: COLONOSCOPY;  Surgeon: Rogene Houston, MD;  Location: [facility];  Service: Endoscopy;  Laterality: N/A;  215  . COLONOSCOPY WITH PROPOFOL N/A 12/31/2012   Procedure: COLONOSCOPY WITH PROPOFOL;  Surgeon: Rogene Houston, MD;  Location: [facility];  Service: Endoscopy;  Laterality: N/A;  in cecum at 0814, total withdrawal time 31mn  . COLONOSCOPY WITH PROPOFOL N/A 02/05/2018   Procedure: COLONOSCOPY WITH PROPOFOL;  Surgeon: RRogene Houston MD;  Location: [facility];  Service: Endoscopy;  Laterality: N/A;  1:25  . CORONARY ANGIOPLASTY WITH STENT PLACEMENT    . CYSTOSCOPY W/ URETERAL STENT PLACEMENT Right 11/10/2019   [facility] . CYSTOSCOPY WITH RETROGRADE PYELOGRAM, URETEROSCOPY AND STENT PLACEMENT Right 12/22/2019   Procedure: CYSTOSCOPY WITH RIGHT URETERAL STENT REMOVAL; RIGHT RETROGRADE PYELOGRAM,RIGHT URETEROSCOPY;  Surgeon: MCleon Gustin MD;  Location: [facility];  Service: Urology;  Laterality: Right;  . ESOPHAGEAL DILATION N/A 06/07/2015   Procedure: ESOPHAGEAL DILATION;  Surgeon: NRogene Houston MD;  Location: [facility];  Service: Endoscopy;  Laterality: N/A;  . ESOPHAGOGASTRODUODENOSCOPY N/A 06/07/2015   Procedure: ESOPHAGOGASTRODUODENOSCOPY (EGD);  Surgeon: NRogene Houston MD;  [facility];  Service: Endoscopy;  Laterality: N/A;  2:45 - moved to 1:00 - Ann to notify pt  . ESOPHAGOGASTRODUODENOSCOPY (EGD) WITH PROPOFOL N/A 12/31/2012   Procedure:  ESOPHAGOGASTRODUODENOSCOPY (EGD) WITH PROPOFOL;  Surgeon: NRogene Houston MD;  Location: [facility];  Service: Endoscopy;  Laterality: N/A;  GE junction 37,  . ESOPHAGOGASTRODUODENOSCOPY (EGD) WITH PROPOFOL N/A 07/01/2013   Procedure: ESOPHAGOGASTRODUODENOSCOPY (EGD) WITH PROPOFOL;  Surgeon: NRogene Houston MD;  Location: [facility];  Service: Endoscopy;  Laterality: N/A;  950  . MALONEY DILATION N/A 12/31/2012   Procedure: MALONEY DILATION;  Surgeon: NRogene Houston MD;  Location: [facility];  Service: Endoscopy;  Laterality: N/A;  used a # 54,#56  . MALONEY DILATION N/A 07/01/2013   Procedure: MVenia MinksDILATION;  Surgeon: NRogene Houston MD;  Location: [facility];  Service: Endoscopy;  Laterality: N/A;  54/58; no heme present  . POLYPECTOMY  02/05/2018   Procedure: POLYPECTOMY;  Surgeon: RRogene Houston MD;  [facility];  Service: Endoscopy;;  distal rectum (HS)  . STONE EXTRACTION WITH BASKET Right 12/22/2019   Procedure: STONE EXTRACTION WITH BASKET;  Surgeon: MCleon Gustin MD;  Location: [facility];  Service: Urology;  Laterality: Right;     Allergies  Allergen Reactions  . Amphetamine-Dextroamphetamine Swelling  . Nitrofuran Derivatives Itching and Swelling  . Amphetamine-Dextroamphet Er Swelling  . Pregabalin Swelling  . Topiramate Other (See Comments)    Tongue tingle  . Verelan [Verapamil] Rash      Family History  Problem Relation Age of Onset  . Heart attack Mother   . Colon cancer Mother   . Heart attack Father   . Stroke Sister   . Hemophilia Child   . Cancer Other   . Coronary artery disease Other   . Cancer Brother   . Cancer Maternal Aunt   . Cancer Maternal Uncle   .  Cancer Paternal Aunt   . Cancer Paternal Uncle   . Cancer Brother      Social History Ms. Arai reports that she has never smoked. She has never used smokeless tobacco. Ms. Rueckert reports no history of alcohol use.   Review of Systems CONSTITUTIONAL: No weight loss, fever, chills,  weakness or fatigue.  HEENT: Eyes: No visual loss, blurred vision, double vision or yellow sclerae.No hearing loss, sneezing, congestion, runny nose or sore throat.  SKIN: No rash or itching.  CARDIOVASCULAR: per hpi RESPIRATORY:per hpi GASTROINTESTINAL: No anorexia, nausea, vomiting or diarrhea. No abdominal pain or blood.  GENITOURINARY: No burning on urination, no polyuria NEUROLOGICAL: No headache, dizziness, syncope, paralysis, ataxia, numbness or tingling in the extremities. No change in bowel or bladder control.  MUSCULOSKELETAL: No muscle, back pain, joint pain or stiffness.  LYMPHATICS: No enlarged nodes. No history of splenectomy.  PSYCHIATRIC: No history of depression or anxiety.  ENDOCRINOLOGIC: No reports of sweating, cold or heat intolerance. No polyuria or polydipsia.  Marland Kitchen   Physical Examination Vitals:   08/07/20 1614  BP: (!) 142/70  Pulse: 87  SpO2: 93%   Filed Weights   08/07/20 1614  Weight: 245 lb (111.1 kg)    Gen: resting comfortably, no acute distress HEENT: no scleral icterus, pupils equal round and reactive, no palptable cervical adenopathy,  CV: RRR, no m/r/g no jvd Resp: Clear to auscultation bilaterally GI: abdomen is soft, non-tender, non-distended, normal bowel sounds, no hepatosplenomegaly MSK: extremities are warm, no edema.  Skin: warm, no rash Neuro:  no focal deficits Psych: appropriate affect   Diagnostic Studies  Echocardiogram 04/05/2019 ([place]):  Summary 1. The left ventricle is normal in size with mildly increased wall thickness. 2. The left ventricular systolic function is normal, LVEF is visually estimated at > 55%.Grade 2 diastolic dysfunction. 3. The left atrium is mildly dilated in size. 4. The right ventricle is mildly dilated in size, with normal systolic function. 5. The right atrium is mildly dilated in size.   08/2016 nuclear stress  There was no ST segment deviation noted during  stress.  The study is normal.  This is a low risk study.  Nuclear stress EF: 68%.   04/2020 30 day monitor - no arrhytnmias  Assessment and Plan  1. CAD with unstable angina - prior MI in 2007, stent to LCX - 3-4 days of progressing left sided chest pressure with associated SOB. Symptoms increasing in frequency and severity - EKG here without acute ischemic changes - seen in ER [facility] yesterday, did not complete workup but one trop was negative  - symptoms concerning for unstable angina - she will present to [facility] ER. Please complete initial evaluation,  I would anticipate transferring to [facility] this evening and having cardiac cath tomorrow for unstable angina unless something unexpected is found in initial evaluation. She is in agreement for cath.   2. Isolated episode of Afib - from notes episodes during admision at [facility] with sepsis - has not had evidence of recurrence, recent 30 day monitor was benign -has not been committed to anticoag       Arnoldo Lenis, M.D.

## 2020-08-08 ENCOUNTER — Observation Stay (HOSPITAL_BASED_OUTPATIENT_CLINIC_OR_DEPARTMENT_OTHER): Payer: Medicare Other

## 2020-08-08 DIAGNOSIS — J449 Chronic obstructive pulmonary disease, unspecified: Secondary | ICD-10-CM | POA: Diagnosis not present

## 2020-08-08 DIAGNOSIS — R0789 Other chest pain: Secondary | ICD-10-CM | POA: Diagnosis present

## 2020-08-08 DIAGNOSIS — I10 Essential (primary) hypertension: Secondary | ICD-10-CM | POA: Diagnosis not present

## 2020-08-08 DIAGNOSIS — I25118 Atherosclerotic heart disease of native coronary artery with other forms of angina pectoris: Secondary | ICD-10-CM | POA: Diagnosis not present

## 2020-08-08 DIAGNOSIS — Z79899 Other long term (current) drug therapy: Secondary | ICD-10-CM | POA: Diagnosis not present

## 2020-08-08 DIAGNOSIS — R079 Chest pain, unspecified: Secondary | ICD-10-CM | POA: Diagnosis not present

## 2020-08-08 DIAGNOSIS — Z7982 Long term (current) use of aspirin: Secondary | ICD-10-CM | POA: Diagnosis not present

## 2020-08-08 DIAGNOSIS — I110 Hypertensive heart disease with heart failure: Secondary | ICD-10-CM | POA: Diagnosis not present

## 2020-08-08 DIAGNOSIS — Z20822 Contact with and (suspected) exposure to covid-19: Secondary | ICD-10-CM | POA: Diagnosis not present

## 2020-08-08 DIAGNOSIS — I5032 Chronic diastolic (congestive) heart failure: Secondary | ICD-10-CM | POA: Diagnosis not present

## 2020-08-08 DIAGNOSIS — E119 Type 2 diabetes mellitus without complications: Secondary | ICD-10-CM | POA: Diagnosis not present

## 2020-08-08 DIAGNOSIS — Z7984 Long term (current) use of oral hypoglycemic drugs: Secondary | ICD-10-CM | POA: Diagnosis not present

## 2020-08-08 DIAGNOSIS — I4891 Unspecified atrial fibrillation: Secondary | ICD-10-CM | POA: Diagnosis not present

## 2020-08-08 DIAGNOSIS — Z794 Long term (current) use of insulin: Secondary | ICD-10-CM | POA: Diagnosis not present

## 2020-08-08 LAB — PROTIME-INR
INR: 1.1 (ref 0.8–1.2)
Prothrombin Time: 13.7 seconds (ref 11.4–15.2)

## 2020-08-08 LAB — ECHOCARDIOGRAM COMPLETE
AR max vel: 1.83 cm2
AV Area VTI: 1.91 cm2
AV Area mean vel: 1.77 cm2
AV Mean grad: 5 mmHg
AV Peak grad: 7.4 mmHg
Ao pk vel: 1.36 m/s
Area-P 1/2: 3 cm2
Height: 64 in
S' Lateral: 3.4 cm
Weight: 3880.1 oz

## 2020-08-08 LAB — BASIC METABOLIC PANEL
Anion gap: 9 (ref 5–15)
BUN: 10 mg/dL (ref 8–23)
CO2: 28 mmol/L (ref 22–32)
Calcium: 8.7 mg/dL — ABNORMAL LOW (ref 8.9–10.3)
Chloride: 99 mmol/L (ref 98–111)
Creatinine, Ser: 0.67 mg/dL (ref 0.44–1.00)
GFR, Estimated: >60 mL/min (ref >60)
Glucose, Bld: 233 mg/dL — ABNORMAL HIGH (ref 70–99)
Potassium: 3.7 mmol/L (ref 3.5–5.1)
Sodium: 136 mmol/L (ref 135–145)

## 2020-08-08 LAB — CBC
HCT: 35.4 % — ABNORMAL LOW (ref 36.0–46.0)
Hemoglobin: 10.6 g/dL — ABNORMAL LOW (ref 12.0–15.0)
MCH: 22 pg — ABNORMAL LOW (ref 26.0–34.0)
MCHC: 29.9 g/dL — ABNORMAL LOW (ref 30.0–36.0)
MCV: 73.4 fL — ABNORMAL LOW (ref 80.0–100.0)
Platelets: 382 10*3/uL (ref 150–400)
RBC: 4.82 MIL/uL (ref 3.87–5.11)
RDW: 16.1 % — ABNORMAL HIGH (ref 11.5–15.5)
WBC: 11.4 10*3/uL — ABNORMAL HIGH (ref 4.0–10.5)
nRBC: 0 % (ref 0.0–0.2)

## 2020-08-08 LAB — GLUCOSE, CAPILLARY
Glucose-Capillary: 175 mg/dL — ABNORMAL HIGH (ref 70–99)
Glucose-Capillary: 229 mg/dL — ABNORMAL HIGH (ref 70–99)
Glucose-Capillary: 248 mg/dL — ABNORMAL HIGH (ref 70–99)

## 2020-08-08 LAB — BRAIN NATRIURETIC PEPTIDE: B Natriuretic Peptide: 33.5 pg/mL (ref 0.0–100.0)

## 2020-08-08 LAB — LIPID PANEL
Cholesterol: 115 mg/dL (ref 0–200)
HDL: 37 mg/dL — ABNORMAL LOW (ref >40)
LDL Cholesterol: 54 mg/dL (ref 0–99)
Total CHOL/HDL Ratio: 3.1 RATIO
Triglycerides: 118 mg/dL (ref <150)
VLDL: 24 mg/dL (ref 0–40)

## 2020-08-08 LAB — HEMOGLOBIN A1C
Hgb A1c MFr Bld: 11.2 % — ABNORMAL HIGH (ref 4.8–5.6)
Mean Plasma Glucose: 275 mg/dL

## 2020-08-08 LAB — TROPONIN I (HIGH SENSITIVITY): Troponin I (High Sensitivity): 6 ng/L (ref <18)

## 2020-08-08 LAB — HIV ANTIBODY (ROUTINE TESTING W REFLEX): HIV Screen 4th Generation wRfx: NONREACTIVE

## 2020-08-08 MED ORDER — METOPROLOL SUCCINATE ER 100 MG PO TB24
100.0000 mg | ORAL_TABLET | Freq: Every day | ORAL | Status: DC
  Administered 2020-08-08: 100 mg via ORAL
  Filled 2020-08-08: qty 1

## 2020-08-08 MED ORDER — ONDANSETRON HCL 4 MG/2ML IJ SOLN: 4.0000 mg | Freq: Four times a day (QID) | INTRAMUSCULAR | Status: DC | PRN

## 2020-08-08 MED ORDER — ACETAMINOPHEN 325 MG PO TABS
650.0000 mg | ORAL_TABLET | ORAL | Status: DC | PRN
  Administered 2020-08-08: 650 mg via ORAL
  Filled 2020-08-08: qty 2

## 2020-08-08 MED ORDER — FUROSEMIDE 40 MG PO TABS
40.0000 mg | ORAL_TABLET | Freq: Every day | ORAL | Status: DC
  Administered 2020-08-08: 40 mg via ORAL
  Filled 2020-08-08: qty 1

## 2020-08-08 MED ORDER — LISINOPRIL 5 MG PO TABS
5.0000 mg | ORAL_TABLET | Freq: Every day | ORAL | Status: DC
  Administered 2020-08-08: 5 mg via ORAL
  Filled 2020-08-08: qty 1

## 2020-08-08 MED ORDER — DOXEPIN HCL 25 MG PO CAPS
75.0000 mg | ORAL_CAPSULE | Freq: Every day | ORAL | Status: DC
  Filled 2020-08-08: qty 3

## 2020-08-08 MED ORDER — ARMODAFINIL 150 MG PO TABS: 150.0000 mg | ORAL_TABLET | Freq: Every day | ORAL | Status: DC

## 2020-08-08 MED ORDER — DILTIAZEM HCL ER COATED BEADS 240 MG PO CP24
240.0000 mg | ORAL_CAPSULE | Freq: Every day | ORAL | Status: DC
  Administered 2020-08-08: 240 mg via ORAL
  Filled 2020-08-08: qty 1

## 2020-08-08 MED ORDER — SUCRALFATE 1 G PO TABS
1.0000 g | ORAL_TABLET | Freq: Four times a day (QID) | ORAL | Status: DC
  Administered 2020-08-08: 1 g via ORAL
  Filled 2020-08-08 (×2): qty 1

## 2020-08-08 MED ORDER — INSULIN ASPART 100 UNIT/ML IJ SOLN: 0.0000 [IU] | Freq: Every day | INTRAMUSCULAR | Status: DC

## 2020-08-08 MED ORDER — NITROGLYCERIN 0.4 MG SL SUBL: 0.4000 mg | SUBLINGUAL_TABLET | SUBLINGUAL | 3 refills | Status: DC | PRN

## 2020-08-08 MED ORDER — ESTRADIOL 1 MG PO TABS
0.5000 mg | ORAL_TABLET | Freq: Every day | ORAL | Status: DC
  Administered 2020-08-08: 0.5 mg via ORAL
  Filled 2020-08-08: qty 0.5

## 2020-08-08 MED ORDER — NITROGLYCERIN 0.4 MG SL SUBL: 0.4000 mg | SUBLINGUAL_TABLET | SUBLINGUAL | Status: DC | PRN

## 2020-08-08 MED ORDER — MONTELUKAST SODIUM 10 MG PO TABS: 10.0000 mg | ORAL_TABLET | Freq: Every day | ORAL | Status: DC

## 2020-08-08 MED ORDER — ONDANSETRON HCL 4 MG PO TABS: 4.0000 mg | ORAL_TABLET | Freq: Four times a day (QID) | ORAL | Status: DC | PRN

## 2020-08-08 MED ORDER — ASPIRIN EC 81 MG PO TBEC
81.0000 mg | DELAYED_RELEASE_TABLET | Freq: Every day | ORAL | Status: DC
  Administered 2020-08-08: 81 mg via ORAL
  Filled 2020-08-08: qty 1

## 2020-08-08 MED ORDER — BUPROPION HCL ER (SR) 100 MG PO TB12
100.0000 mg | ORAL_TABLET | Freq: Every day | ORAL | Status: DC
  Administered 2020-08-08: 100 mg via ORAL
  Filled 2020-08-08: qty 1

## 2020-08-08 MED ORDER — MORPHINE SULFATE ER 30 MG PO TBCR: 30.0000 mg | EXTENDED_RELEASE_TABLET | Freq: Two times a day (BID) | ORAL | Status: DC

## 2020-08-08 MED ORDER — FERROUS SULFATE 325 (65 FE) MG PO TABS
325.0000 mg | ORAL_TABLET | Freq: Every morning | ORAL | Status: DC
  Administered 2020-08-08: 325 mg via ORAL
  Filled 2020-08-08: qty 1

## 2020-08-08 MED ORDER — ALPRAZOLAM 0.5 MG PO TABS
0.5000 mg | ORAL_TABLET | Freq: Two times a day (BID) | ORAL | Status: DC | PRN
  Administered 2020-08-08: 0.5 mg via ORAL
  Filled 2020-08-08: qty 1

## 2020-08-08 MED ORDER — ATORVASTATIN CALCIUM 40 MG PO TABS
40.0000 mg | ORAL_TABLET | Freq: Every day | ORAL | Status: DC
  Administered 2020-08-08: 40 mg via ORAL
  Filled 2020-08-08: qty 1

## 2020-08-08 MED ORDER — METOCLOPRAMIDE HCL 5 MG PO TABS: 5.0000 mg | ORAL_TABLET | Freq: Three times a day (TID) | ORAL | Status: DC | PRN

## 2020-08-08 MED ORDER — HEPARIN SODIUM (PORCINE) 5000 UNIT/ML IJ SOLN
5000.0000 [IU] | Freq: Three times a day (TID) | INTRAMUSCULAR | Status: DC
  Administered 2020-08-08: 5000 [IU] via SUBCUTANEOUS
  Filled 2020-08-08 (×2): qty 1

## 2020-08-08 MED ORDER — PANTOPRAZOLE SODIUM 40 MG PO TBEC
40.0000 mg | DELAYED_RELEASE_TABLET | Freq: Two times a day (BID) | ORAL | Status: DC
  Administered 2020-08-08: 40 mg via ORAL
  Filled 2020-08-08: qty 1

## 2020-08-08 MED ORDER — GABAPENTIN 400 MG PO CAPS
400.0000 mg | ORAL_CAPSULE | Freq: Three times a day (TID) | ORAL | Status: DC
  Administered 2020-08-08: 400 mg via ORAL
  Filled 2020-08-08: qty 1

## 2020-08-08 MED ORDER — INSULIN ASPART 100 UNIT/ML IJ SOLN
0.0000 [IU] | Freq: Three times a day (TID) | INTRAMUSCULAR | Status: DC
  Administered 2020-08-08 (×2): 7 [IU] via SUBCUTANEOUS

## 2020-08-08 MED ORDER — PERFLUTREN LIPID MICROSPHERE
1.0000 mL | INTRAVENOUS | Status: AC | PRN
  Administered 2020-08-08: 2 mL via INTRAVENOUS
  Filled 2020-08-08: qty 10

## 2020-08-08 MED ORDER — OXYCODONE HCL 5 MG PO TABS
5.0000 mg | ORAL_TABLET | Freq: Four times a day (QID) | ORAL | Status: DC | PRN
  Administered 2020-08-08 (×2): 5 mg via ORAL
  Filled 2020-08-08 (×2): qty 1

## 2020-08-08 MED ORDER — MODAFINIL 100 MG PO TABS
100.0000 mg | ORAL_TABLET | Freq: Every day | ORAL | Status: DC
  Administered 2020-08-08: 100 mg via ORAL
  Filled 2020-08-08: qty 1

## 2020-08-08 MED ORDER — MORPHINE SULFATE ER 30 MG PO TBCR
30.0000 mg | EXTENDED_RELEASE_TABLET | Freq: Two times a day (BID) | ORAL | Status: DC
  Administered 2020-08-08 (×2): 30 mg via ORAL
  Filled 2020-08-08 (×2): qty 1

## 2020-08-08 NOTE — Progress Notes (Signed)
Progress Note  Patient Name: [name] Date of Encounter: 08/08/2020  Primary Cardiologist: Carlyle Dolly, MD  Subjective   Patient has continued to have ongoing persistent chest discomfort worsened with specific short turns in bed and palpation. Not worse with inspiration. Also on home oral morphine for chronic pain in feet, legs, knees, back - was pending rheumatology eval for ankylosing spondylitis per the paper she showed me.  Inpatient Medications    Scheduled Meds: . aspirin EC  81 mg Oral Daily  . atorvastatin  40 mg Oral Daily  . buPROPion  100 mg Oral Daily  . diltiazem  240 mg Oral Daily  . doxepin  75 mg Oral QHS  . estradiol  0.5 mg Oral Daily  . ferrous sulfate  325 mg Oral q morning  . furosemide  40 mg Oral Daily  . gabapentin  400 mg Oral TID  . heparin  5,000 Units Subcutaneous Q8H  . insulin aspart  0-20 Units Subcutaneous TID WC  . insulin aspart  0-5 Units Subcutaneous QHS  . lisinopril  5 mg Oral Daily  . metoprolol succinate  100 mg Oral Daily  . modafinil  100 mg Oral Daily  . montelukast  10 mg Oral QHS  . morphine  30 mg Oral Q12H  . pantoprazole  40 mg Oral BID AC  . sucralfate  1 g Oral QID   Continuous Infusions:  PRN Meds: acetaminophen, ALPRAZolam, metoCLOPramide, nitroGLYCERIN, ondansetron (ZOFRAN) IV, ondansetron, oxyCODONE   Vital Signs    Vitals:   08/08/20 0053 08/08/20 0207 08/08/20 0457 08/08/20 0810  BP: (!) 170/84 (!) 164/74 125/63 (!) 129/54  Pulse: 97 95 89 93  Resp: 16  16 18   Temp: 99 F (37.2 C)  98.7 F (37.1 C) 97.9 F (36.6 C)  TempSrc: Oral  Oral Oral  SpO2: 99%  98% 98%  Weight:      Height:        Intake/Output Summary (Last 24 hours) at 08/08/2020 0938 Last data filed at 08/08/2020 0900 Gross per 24 hour  Intake 462 ml  Output 50 ml  Net 412 ml   Last 3 Weights 08/08/2020 08/07/2020 08/07/2020  Weight (lbs) 242 lb 8.1 oz 226 lb 245 lb  Weight (kg) 110 kg 102.513 kg 111.131 kg     Telemetry     NSR - Personally Reviewed  ECG    NSR no acute STT changes - Personally Reviewed  Physical Exam  * GEN: No acute distress.  HEENT: Normocephalic, atraumatic, sclera non-icteric. Neck: No JVD or bruits. Cardiac: RRR no murmurs, rubs, or gallops.  Respiratory: Clear to auscultation bilaterally. Breathing is unlabored. GI: Soft, nontender, non-distended, BS +x 4. MS: no deformity. Skin: minimal intertriginous erythema under both breasts - no overt signs of candida or skin breakdown Extremities: No clubbing or cyanosis. No edema. Feet are cool which patient states is chronic Neuro:  AAOx3. Follows commands. Psych:  Responds to questions appropriately with a normal affect.  Labs    High Sensitivity Troponin:   Recent Labs  Lab 08/07/20 1935 08/08/20 0452  TROPONINIHS 5 6      Cardiac EnzymesNo results for input(s): TROPONINI in the last 168 hours. No results for input(s): TROPIPOC in the last 168 hours.   Chemistry Recent Labs  Lab 08/07/20 1935 08/08/20 0244  NA 138 136  K 4.2 3.7  CL 101 99  CO2 29 28  GLUCOSE 184* 233*  BUN 15 10  CREATININE 0.68 0.67  CALCIUM 9.1 8.7*  GFRNONAA >60 >60  ANIONGAP 8 9     Hematology Recent Labs  Lab 08/07/20 1935 08/08/20 0244  WBC 11.6* 11.4*  RBC 5.39* 4.82  HGB 11.8* 10.6*  HCT 40.6 35.4*  MCV 75.3* 73.4*  MCH 21.9* 22.0*  MCHC 29.1* 29.9*  RDW 16.1* 16.1*  PLT 411* 382    BNP Recent Labs  Lab 08/08/20 0244  BNP 33.5     DDimer No results for input(s): DDIMER in the last 168 hours.   Radiology    DG Chest Portable 1 View  Result Date: 08/07/2020 CLINICAL DATA:  Chest pain and shortness of breath. EXAM: PORTABLE CHEST 1 VIEW COMPARISON:  Radiograph yesterday at [facility].  CT 06/19/2020 FINDINGS: Upper normal heart size.The cardiomediastinal contours are normal. The lungs are clear. Pulmonary vasculature is normal. No consolidation, pleural effusion, or pneumothorax. No acute osseous abnormalities  are seen. IMPRESSION: No acute chest findings.  No change from radiographs yesterday. Electronically Signed   By: Keith Rake M.D.   On: 08/07/2020 18:22    Cardiac Studies   2D Echo 2014 - Study data: Technically adequate study.  - Left ventricle: The cavity size was normal. Wall thickness  was increased in a pattern of mild LVH. Systolic function  was normal. The estimated ejection fraction was in the  range of 60% to 65%. Doppler parameters are consistent  with abnormal left ventricular relaxation (grade 1  diastolic dysfunction).  - Aortic valve: Valve area: 2.49cm^2(VTI). Valve area:  2.43cm^2 (Vmax).  - Mitral valve: Calcified annulus. Mildly thickened leaflets  .  - Right ventricle: The cavity size was mildly dilated.   LHC 2008  DATE OF PROCEDURE:  08/18/2006  DATE OF DISCHARGE:                            CARDIAC CATHETERIZATION   PROCEDURE:  Left heart catheterization, left ventriculography, coronary  angiography.   INDICATIONS:  Ms. Styron is a 70 year old lady with coronary disease in  the setting of hypertension, diabetes and hypercholesterolemia.  She  underwent placement of drug-eluting stent in her circumflex in 2007.  She now presents with recurrent chest pain, occurring at rest.  She has  associated chest wall tenderness.  Electrocardiogram is unchanged from  previous, and troponins were negative x2.  Nonetheless, given her known  disease, she was recommended to undergo cardiac catheterization, for  definitive exclusion of progression of the disease as the etiology of  her chest discomfort.   PROCEDURAL TECHNIQUE:  Informed consent was obtained.  Underwent 1%  lidocaine, local anesthesia, a 5-French sheath was placed in the right  common femoral artery using the modified Seldinger technique.  While he  had no difficulty with access, the patient developed hypotension and  bradycardia, as well as nausea and diaphoresis, upon placement of  the  sheath.  Symptoms were consistent with vagal episode.  She was treated  with 1 ampule of atropine and IV fluid, with resolution of her symptoms  and improvement in systolic blood pressure from 60 mmHg to 100 mmHg.  The remainder of the procedure, she remained stable.   Coronary angiography and left heart catheterization were performed using  JL-4, JR-4, and pigtail catheters.  We also used a no torque right  catheter.  We injected intra-arterial nitroglycerin into the right  coronary, to assess for possible spasm.  The patient tolerated the  procedure well was transferred to holding room  in stable condition.  Sheaths will be removed there.   COMPLICATIONS:  None.   FINDINGS:  1. LV:  97/5/9.  EF 65% without regional wall motion abnormality.  2. No aortic stenosis or mitral regurgitation.  3. Left main:  Angiographically normal.  4. LAD:  Moderate-sized vessel giving rise to two diagonals.  It is      angiographically normal.  5. Circumflex:  Moderate-sized vessel giving rise to two marginals and      a posterior left ventricular branch.  The previously placed stent      in the distal circumflex is widely patent.  There is a 40% stenosis      of the posterior left ventricular branch.  6. RCA:  Moderate-sized dominant vessel.  There is a 40% stenosis      proximally, which may well be spasm.  It improved but did not      completely resolve with nitroglycerin.   IMPRESSION/PLAN:  Widely patent circumflex stent and mild nonobstructive  disease of the circumflex and RCA.  Suspect a noncardiac etiology to her  chest discomfort.  While spasm has been entertained as a possible  etiology of her chest discomfort in the past, the absence of  electrocardiogram during active absence of electrocardiographic change  during active symptoms argues against this for the current episode.      Ethelle Lyon, MD  Electronically Signed   Patient Profile     70 y.o. female with  CAD s/p MI/stent to Cx 2007, anxiety, depression, chronic diastolic CHF, morbid obesity, COPD, IDDM with polyneuropathy, GERD, chronic pain, dyslipidemia, hemophilia A, HTN, OSA, PAF (per Dr. Nelly Laurence note, occurred 10/2019 in setting of UTI with sepsis, f/u monitor 04/2020 without recurrent afib so not committed to long term anticoag). Seen in office yesterday with worsening chest pressure and nausea. Upon arrival, also had atypical component to symptoms as well.  Last cath 2008 as reviewed above for chest pain - widely patent stent, nonobstructive mild disease of CX and RCA - chest pain felt noncardiac at that time.   Assessment & Plan    1. Chest pain - initially felt possibly unstable angina by Dr. Harl Bowie but upon fellow cardiology evaluation, the pain is focal and reproducible and felt to be more c/w musculoskeletal pain - I agree with this assessment - 2D echo planned - troponins neg x 2 which also argues against cardiac cause - contnue ASA, statin, BB - mild intertriginous erythema beneath both breasts without satellite lesions or skin breakdown, recommend keeping clean/dry and f/u PCP, no signs of secondary infection  2. Essential HTN - recent BP reported to be elevated at outside ER ([facility]) but normalized this AM  - continue diltiazem 263m, Lasix 446m Lisinopril 68m48mToprol 100m64m3. Chronic diastolic CHF - BNP normal and fluid status felt euvolemic - continue home Lasix  4. DM2 - metformin on hold - continue SSI - have asked pharmacy team to help finalize home med rec as this has not been completed, sig on Novolog is missing  5. Anxiety/depression - continue home regimen - also with chronic pain on MS Contin - recommend ongoing management with primary care/primary prescriber to review for polypharmacy  6. Abnormal CBC with microcytic anemia, leukocytosis - similar values seen in prior outside labs - f/u primary care  7. H/o isolated PAF in 2021 in setting of UTI - no  recurrence by 04/2020 monitor and not seen this admission - continue diltiazem, BB - continue OP  surveillance  For questions or updates, please contact [place] Please consult [email or web address] for contact info under Cardiology/STEMI.  Signed, Charlie Pitter, PA-C 08/08/2020, 9:38 AM

## 2020-08-08 NOTE — Progress Notes (Signed)
   08/08/20 0053  Vitals  Temp 99 F (37.2 C)  Temp Source Oral  BP (!) 170/84  MAP (mmHg) 107  BP Location Right Arm  BP Method Automatic  Patient Position (if appropriate) Lying  Pulse Rate 97  Pulse Rate Source Monitor  Resp 16  MEWS COLOR  MEWS Score Color Green  Oxygen Therapy  SpO2 99 %  O2 Device Nasal Cannula  O2 Flow Rate (L/min) 2 L/min  Pain Assessment  Pain Scale 0-10  Pain Score 7  Pain Type Acute pain  Pain Location Chest  Pain Orientation Left  Pain Radiating Towards left arm  Pain Descriptors / Indicators Sharp  Pain Frequency Intermittent  Pain Onset On-going  Pain Intervention(s) MD notified (Comment)  MEWS Score  MEWS Temp 0  MEWS Systolic 0  MEWS Pulse 0  MEWS RR 0  MEWS LOC 0  MEWS Score 0  Pt arrived to the unit. Pain 7/10 in chest under left breast. MD notified of patient arrival.

## 2020-08-08 NOTE — Progress Notes (Signed)
D/C instructions given and reviewed. Tele and IV removed, tolerated well. Family here to transport home.

## 2020-08-08 NOTE — Progress Notes (Signed)
  Echocardiogram 2D Echocardiogram with contrast has been performed.  Merrie Roof F 08/08/2020, 11:20 AM

## 2020-08-08 NOTE — Discharge Summary (Signed)
Discharge Summary    Patient ID: [name] MRN: [id number]; DOB: [birth year]  Admit date: 08/07/2020 Discharge date: 08/08/2020  PCP:  Glenda Chroman, MD   [place] [facility] Cardiologist:  Carlyle Dolly, MD        Discharge Diagnoses    Principal Problem:   Non-cardiac chest pain Active Problems:   Mixed hyperlipidemia   Obstructive sleep apnea   Chronic diastolic heart failure (HCC)   CAD (coronary artery disease)   Type 2 diabetes mellitus with vascular disease ([place])   Diabetic polyneuropathy ([place])   Anxiety and depression   Chronic low back pain without sciatica   Morbid obesity due to excess calories ([place])    Diagnostic Studies/Procedures    2D Echo 08/08/20  1. Left ventricular ejection fraction, by estimation, is 55 to 60%. The  left ventricle has normal function. The left ventricle has no regional  wall motion abnormalities. There is mild concentric left ventricular  hypertrophy. Left ventricular diastolic  parameters are consistent with Grade I diastolic dysfunction (impaired  relaxation). Elevated left atrial pressure.  2. Right ventricular systolic function is normal. The right ventricular  size is normal.  3. The mitral valve is normal in structure. No evidence of mitral valve  regurgitation. No evidence of mitral stenosis.  4. The aortic valve is normal in structure. Aortic valve regurgitation is  trivial. No aortic stenosis is present.  5. The inferior vena cava is normal in size with greater than 50%  respiratory variability, suggesting right atrial pressure of 3 mmHg.   Comparison(s): Prior images unable to be directly viewed, comparison made  by report only.  _____________   History of Present Illness     [name] is a 70 y.o. female withCAD s/p MI/stent to Cx 2007, anxiety, depression, chronic diastolic CHF, morbid obesity, COPD, IDDM with polyneuropathy, GERD, chronic pain, dyslipidemia, hemophilia A, HTN, OSA, PAF (per  Dr. Nelly Laurence note, occurred 10/2019 in setting of UTI with sepsis, f/u monitor 04/2020 without recurrent afib so not committed to long term anticoag). Her last cath was in 2008 showing widely patent stent, nonobstructive mild disease of CX and RCA - chest pain felt noncardiac at that time. NST 2018 was nonischemic.  She was seen in the office yesterday by Dr. Harl Bowie complaining of chest discomfort. She had recently been seen in the [facility] ED and had negative troponin level. She LWBS due to the wait. Due to recurrent/persistent symptoms she was sent to the ED for further evaluation/admission. Here, initial troponin was negative and EKG showed no acute changes.  Hospital Course     1. Chest pain - mixed features when described in the office - initially felt possible unstable angina but upon re-evaluation here, the patient clarified that symptoms are persistent and exquisitely reproducible with even slight movements in bed, reaching over with her arms, and very focally reproducible with palpation - denied symptoms upon inspiration or exertion - troponins remained negative x2 despite fairly persistent pain, arguing against ischemic etiology - 2D echo showed normal EF, grade 1 DD, elevated LA pressure, normal right ventricle, no significant valvular disease, normal IVC, no evidence of pericardial effusion - she was not tachycardic, tachypneic or hypoxic - O2 sat of 89% listed in VS found to be erroneous, rechecked/confirmed and it was 94% RA with no signs of respiratory distress, clear lungs and normal CXR - contnue ASA, statin, BB - chest pain felt musculoskeletal in etiology, superimposed on a backdrop of chronic pain with  possible rheumatologic component - patient was recently referred to rheumatology for concern for ankylosing spondylitis and encouraged to keep this follow-up - mild intertriginous erythema noted beneath both breasts without satellite lesions or skin breakdown, recommend keeping  clean/dry and f/u PCP, no signs of secondary infection   2. Essential HTN - recent BP reported to be elevated at outside ER [facility]) but was frequently normal during admission - the patient was instructed to monitor their blood pressure at home and to call if tending to run higher than 373 systolic  3. Chronic diastolic CHF - BNP normal and fluid status felt euvolemic - continue home Lasix  4. DM2 - she will restart home med regimen at discharge  5. Anxiety/depression - continue home regimen - also with chronic pain on MS Contin - recommend ongoing management with primary care/primary prescriber to review for polypharmacy  6. Abnormal CBC with microcytic anemia, leukocytosis - similar values seen in prior outside labs - f/u primary care  7. H/o isolated PAF in 2021 in setting of UTI - no recurrence by 04/2020 monitor and not seen this admission - continue diltiazem, BB - continue OP surveillance  8. OSA - patient has not recently been using CPAP, encouraged to resume  The patient is felt to have met maximum benefit from inpatient hospitalization. Dr. Debara Pickett has seen and examined the patient today and feels she is stable for discharge. She was advised to schedule a follow-up visit with her PCP/pain management team. She also has follow-up arranged with cardiology in the [place] office.  Did the patient have an acute coronary syndrome (MI, NSTEMI, STEMI, etc) this admission?:  No                               Did the patient have a percutaneous coronary intervention (stent / angioplasty)?:  No.    _____________  Discharge Vitals Blood pressure (!) 167/78, pulse 79, temperature (!) 97.4 F (36.3 C), temperature source Oral, resp. rate 16, height 5' 4"  (1.626 m), weight 110 kg, SpO2 94 %.  Filed Weights   08/07/20 1752 08/08/20 0051  Weight: 102.5 kg 110 kg    Labs & Radiologic Studies    CBC Recent Labs    08/07/20 1935 08/08/20 0244  WBC 11.6* 11.4*  HGB 11.8* 10.6*   HCT 40.6 35.4*  MCV 75.3* 73.4*  PLT 411* 428   Basic Metabolic Panel Recent Labs    08/07/20 1935 08/08/20 0244  NA 138 136  K 4.2 3.7  CL 101 99  CO2 29 28  GLUCOSE 184* 233*  BUN 15 10  CREATININE 0.68 0.67  CALCIUM 9.1 8.7*   High Sensitivity Troponin:   Recent Labs  Lab 08/07/20 1935 08/08/20 0452  TROPONINIHS 5 6    Fasting Lipid Panel Recent Labs    08/08/20 0244  CHOL 115  HDL 37*  LDLCALC 54  TRIG 118  CHOLHDL 3.1    _____________  DG Chest Portable 1 View  Result Date: 08/07/2020 CLINICAL DATA:  Chest pain and shortness of breath. EXAM: PORTABLE CHEST 1 VIEW COMPARISON:  Radiograph yesterday at [facility].  CT 06/19/2020 FINDINGS: Upper normal heart size.The cardiomediastinal contours are normal. The lungs are clear. Pulmonary vasculature is normal. No consolidation, pleural effusion, or pneumothorax. No acute osseous abnormalities are seen. IMPRESSION: No acute chest findings.  No change from radiographs yesterday. Electronically Signed   By: Keith Rake M.D.   On:  08/07/2020 18:22   ECHOCARDIOGRAM COMPLETE  Result Date: 08/08/2020    ECHOCARDIOGRAM REPORT   Patient Name:   [name] Date of Exam: 08/08/2020 Medical Rec #:  [id number]      Height:       64.0 in Accession #:    [id number]     Weight:       242.5 lb Date of Birth:  1950-10-05     BSA:          2.123 m Patient Age:    70 years       BP:           129/54 mmHg Patient Gender: F              HR:           93 bpm. Exam Location:  Inpatient Procedure: 2D Echo, Cardiac Doppler, Color Doppler and Intracardiac            Opacification Agent Indications:    R07.9* Chest pain, unspecified  History:        Patient has prior history of Echocardiogram examinations, most                 recent 02/07/2013.  Sonographer:    Merrie Roof RDCS Referring Phys: [id number] [place]  1. Left ventricular ejection fraction, by estimation, is 55 to 60%. The left ventricle has normal  function. The left ventricle has no regional wall motion abnormalities. There is mild concentric left ventricular hypertrophy. Left ventricular diastolic parameters are consistent with Grade I diastolic dysfunction (impaired relaxation). Elevated left atrial pressure.  2. Right ventricular systolic function is normal. The right ventricular size is normal.  3. The mitral valve is normal in structure. No evidence of mitral valve regurgitation. No evidence of mitral stenosis.  4. The aortic valve is normal in structure. Aortic valve regurgitation is trivial. No aortic stenosis is present.  5. The inferior vena cava is normal in size with greater than 50% respiratory variability, suggesting right atrial pressure of 3 mmHg. Comparison(s): Prior images unable to be directly viewed, comparison made by report only. FINDINGS  Left Ventricle: Left ventricular ejection fraction, by estimation, is 55 to 60%. The left ventricle has normal function. The left ventricle has no regional wall motion abnormalities. Definity contrast agent was given IV to delineate the left ventricular  endocardial borders. The left ventricular internal cavity size was normal in size. There is mild concentric left ventricular hypertrophy. Left ventricular diastolic parameters are consistent with Grade I diastolic dysfunction (impaired relaxation). Elevated left atrial pressure. Right Ventricle: The right ventricular size is normal. No increase in right ventricular wall thickness. Right ventricular systolic function is normal. Left Atrium: Left atrial size was normal in size. Right Atrium: Right atrial size was normal in size. Pericardium: There is no evidence of pericardial effusion. Mitral Valve: The mitral valve is normal in structure. No evidence of mitral valve regurgitation. No evidence of mitral valve stenosis. Tricuspid Valve: The tricuspid valve is normal in structure. Tricuspid valve regurgitation is not demonstrated. No evidence of tricuspid  stenosis. Aortic Valve: The aortic valve is normal in structure. Aortic valve regurgitation is trivial. No aortic stenosis is present. Aortic valve mean gradient measures 5.0 mmHg. Aortic valve peak gradient measures 7.4 mmHg. Aortic valve area, by VTI measures 1.91 cm. Pulmonic Valve: The pulmonic valve was normal in structure. Pulmonic valve regurgitation is not visualized. No evidence of pulmonic stenosis. Aorta: The aortic root is normal  in size and structure. Venous: The inferior vena cava is normal in size with greater than 50% respiratory variability, suggesting right atrial pressure of 3 mmHg. IAS/Shunts: No atrial level shunt detected by color flow Doppler.  LEFT VENTRICLE PLAX 2D LVIDd:         5.00 cm  Diastology LVIDs:         3.40 cm  LV e' medial:    5.77 cm/s LV PW:         1.20 cm  LV E/e' medial:  14.6 LV IVS:        1.30 cm  LV e' lateral:   5.66 cm/s LVOT diam:     2.07 cm  LV E/e' lateral: 14.9 LV SV:         52 LV SV Index:   25 LVOT Area:     3.37 cm  RIGHT VENTRICLE RV Basal diam:  3.80 cm LEFT ATRIUM             Index       RIGHT ATRIUM           Index LA diam:        3.50 cm 1.65 cm/m  RA Area:     16.60 cm LA Vol (A2C):   64.6 ml 30.42 ml/m RA Volume:   44.60 ml  21.00 ml/m LA Vol (A4C):   54.2 ml 25.53 ml/m LA Biplane Vol: 59.3 ml 27.93 ml/m  AORTIC VALVE AV Area (Vmax):    1.83 cm AV Area (Vmean):   1.77 cm AV Area (VTI):     1.91 cm AV Vmax:           136.00 cm/s AV Vmean:          102.000 cm/s AV VTI:            0.275 m AV Peak Grad:      7.4 mmHg AV Mean Grad:      5.0 mmHg LVOT Vmax:         73.80 cm/s LVOT Vmean:        53.600 cm/s LVOT VTI:          0.156 m LVOT/AV VTI ratio: 0.57  AORTA Ao Root diam: 3.00 cm Ao Asc diam:  3.20 cm MITRAL VALVE MV Area (PHT): 3.00 cm     SHUNTS MV Decel Time: 253 msec     Systemic VTI:  0.16 m MV E velocity: 84.40 cm/s   Systemic Diam: 2.07 cm MV A velocity: 109.00 cm/s MV E/A ratio:  0.77 Mihai Croitoru MD Electronically signed by Sanda Klein MD Signature Date/Time: 08/08/2020/12:31:31 PM    Final    Disposition   Pt is being discharged home today in good condition.  Follow-up Plans & Appointments     Follow-up Information    Verta Ellen., NP Follow up.   Specialty: Cardiology Why: [place] location - a follow-up has been arranged for you on Tuesday August 28, 2020 at 1:30 PM (Arrive by 1:15 PM). Katina Dung is one of the PAs that works closely with Dr. Harl Bowie. Contact information: [place] [address] [phone]        Glenda Chroman, MD Follow up.   Specialty: Internal Medicine Why: Please schedule a follow-up with your primary care provider and pain management within 1 week to discuss follow-up plan. Contact information: [place] [address] [place]  Discharge Instructions    Diet - low sodium heart healthy   Complete by: As directed    Discharge instructions   Complete by: As directed    Your chest pain was not felt to be coming from your heart - the features of your chest pain were more consistent with a musculoskeletal pain. The fact that this is worse with specific small movements in bed including reaching or pushing on your chest support this conclusion. Your heart bloodwork was also completely normal. Please make sure to schedule a follow-up visit with the providers that help you with your usual pain management.  Please follow up with your primary care for the redness under your breasts. Keep this area clean and dry. Your bloodwork also showed some abnormalities in your complete blood count with mild anemia and elevated white blood cell count which will need primary care follow-up as an outpatient.  It is important for you to use your CPAP / sleep apnea machine for your blood pressure and heart health. Please make sure to follow up with the team that is managing your sleep apnea.  Please monitor your blood pressure over the  next several days at home, at least 3 hours after taking your medicines. Call your doctor if you tend to get readings of greater than 130 on the top number.  We did send in an updated refill for as-needed nitroglycerin to have on file for more typical heart symptoms since you indicated you had run out.   Increase activity slowly   Complete by: As directed       Discharge Medications   Allergies as of 08/08/2020      Reactions   Amphetamine-dextroamphetamine Swelling   Nitrofuran Derivatives Itching, Swelling   Amphetamine-dextroamphet Er Swelling   Pregabalin Swelling   Topiramate Other (See Comments)   Tongue tingle   Verelan [verapamil] Rash      Medication List    TAKE these medications   ALPRAZolam 0.5 MG tablet Commonly known as: XANAX Take 0.5 mg by mouth 2 (two) times daily as needed for anxiety or sleep.   Armodafinil 150 MG tablet Take 150 mg by mouth daily.   aspirin EC 81 MG tablet Take 1 tablet (81 mg total) by mouth daily.   atorvastatin 40 MG tablet Commonly known as: LIPITOR Take 40 mg by mouth daily.   B-D ULTRAFINE III SHORT PEN 31G X 8 MM Misc Generic drug: Insulin Pen Needle Inject into the skin 4 (four) times daily.   buPROPion 100 MG 12 hr tablet Commonly known as: WELLBUTRIN SR Take 100 mg by mouth daily.   clotrimazole-betamethasone cream Commonly known as: Lotrisone Apply to affected area 2 times daily for 21 days What changed:   how much to take  how to take this  when to take this  additional instructions   diclofenac Sodium 1 % Gel Commonly known as: VOLTAREN APPLY TO BOTH KNEES THREE TIMES DAILY What changed: See the new instructions.   diltiazem 240 MG 24 hr capsule Commonly known as: CARDIZEM CD Take 240 mg by mouth daily.   doxepin 25 MG capsule Commonly known as: SINEQUAN Take 75 mg by mouth at bedtime.   estradiol 0.5 MG tablet Commonly known as: ESTRACE Take 0.5 mg by mouth daily.   FeroSul 325 (65 FE) MG  tablet Generic drug: ferrous sulfate Take 325 mg by mouth every morning.   furosemide 40 MG tablet Commonly known as: LASIX Take 1 tablet by mouth daily.  gabapentin 400 MG capsule Commonly known as: NEURONTIN Take 400 mg by mouth 3 (three) times daily.   glipiZIDE 5 MG 24 hr tablet Commonly known as: GLUCOTROL XL Take 2 tablets (10 mg total) by mouth daily with breakfast.   lisinopril 5 MG tablet Commonly known as: ZESTRIL Take 1 tablet (5 mg total) by mouth daily.   meloxicam 7.5 MG tablet Commonly known as: MOBIC Take 7.5 mg by mouth in the morning and at bedtime.   metFORMIN 500 MG tablet Commonly known as: GLUCOPHAGE TAKE 1 TABLET BY MOUTH TWICE DAILY What changed: when to take this   metoCLOPramide 5 MG tablet Commonly known as: REGLAN Take 5 mg by mouth 3 (three) times daily as needed for nausea or vomiting.   metoprolol succinate 100 MG 24 hr tablet Commonly known as: TOPROL-XL Take 100 mg by mouth daily. Take with or immediately following a meal.   montelukast 10 MG tablet Commonly known as: SINGULAIR Take 10 mg by mouth at bedtime.   morphine 30 MG 12 hr tablet Commonly known as: MS CONTIN Take 1 tablet (30 mg total) by mouth every 12 (twelve) hours.   nitroGLYCERIN 0.4 MG SL tablet Commonly known as: NITROSTAT Place 1 tablet (0.4 mg total) under the tongue every 5 (five) minutes as needed for chest pain (up to 3 doses. If taking 3rd dose call 911). What changed:   when to take this  reasons to take this  additional instructions   NovoLOG FlexPen 100 UNIT/ML FlexPen Generic drug: insulin aspart Inject 90 Units into the skin daily.   ondansetron 4 MG tablet Commonly known as: ZOFRAN Take 4 mg by mouth every other day.   pantoprazole 40 MG tablet Commonly known as: PROTONIX TAKE 1 TABLET BY MOUTH TWICE DAILY BEFORE MEALS   polyethylene glycol 17 g packet Commonly known as: MIRALAX / GLYCOLAX Take 17 g by mouth daily as needed for moderate  constipation.   potassium chloride 10 MEQ tablet Commonly known as: KLOR-CON Take 10 mEq by mouth 2 (two) times daily.   ProAir HFA 108 (90 Base) MCG/ACT inhaler Generic drug: albuterol Inhale 2 puffs into the lungs every 4 (four) hours as needed for wheezing or shortness of breath.   promethazine 25 MG tablet Commonly known as: PHENERGAN Take 1 tablet (25 mg total) by mouth 4 (four) times daily as needed. What changed: reasons to take this   SURE COMFORT INS SYR 1CC/30G 30G X 5/16" 1 ML Misc Generic drug: Insulin Syringe-Needle U-100 1 each by Other route daily.   topiramate 200 MG tablet Commonly known as: TOPAMAX Take 200 mg by mouth at bedtime.   Tyler Aas FlexTouch 100 UNIT/ML FlexTouch Pen Generic drug: insulin degludec Inject 80 Units into the skin at bedtime.   Vitamin D3 125 MCG (5000 UT) Caps TAKE ONE CAPSULE BY MOUTH DAILY          Outstanding Labs/Studies   N/A  Duration of Discharge Encounter   Greater than 30 minutes including physician time.  Signed, Charlie Pitter, PA-C 08/08/2020, 1:36 PM

## 2020-08-08 NOTE — H&P (Signed)
Cardiology Admission History and Physical:   Patient ID: MONACA WADAS MRN: [id number]; DOB: [birth year]   Admission date: 08/07/2020  PCP:  Glenda Chroman, MD   [facility] Cardiologist:  Carlyle Dolly, MD        Chief Complaint:  Chest pain  Patient Profile:   [name] is a 70 y.o. female with CAD, CHF, COPD, DM2, OSA, anxiety, depression, GERD  who is being seen 08/08/2020 for the evaluation of chest pain.  History of Present Illness:   Ms. Inghram she was initially seen by cardiology and was referred to the emergency department at [facility] for concern for unstable angina.  Patient has been reporting ongoing chest pressure and pain for the last several days.  She notes that it has been radiating to her left arm.  She also notes some nausea.  No real change in her symptoms with nitroglycerin.  The pain is worse at rest.  She has been to the emergency department twice now for similar pain.  Notes that particularly worse under her left breast where she has noticed some rash.  She also tells me on arrival to the hospital on transfer that the pain is worse and reproducible with palpation.  However, she does note that it is similar to her previous MI.  No other acute symptoms no other medication changes.  Denies any fevers, chills, sick contacts, palpitations, trauma, syncopal episodes, falls.  Does note some abdominal pain and really comments on pain globally.   Past Medical History:  Diagnosis Date  . Anxiety and depression   . Arthritis   . CAD (coronary artery disease)    Stent placement circumflex coronary 2007, catheterization 2008 patent stents. Normal LV function  . Chest pain   . CHF (congestive heart failure) ([place])   . COPD (chronic obstructive pulmonary disease) (HCC)    no home O2  . Depression   . Diabetes mellitus    Insulin dependent  . Diabetic polyneuropathy (HCC)     severe on multiple medications  . Dyslipidemia   . GERD (gastroesophageal  reflux disease)   . Headache(784.0)   . Heart attack ([place])   . Heartburn   . 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 High cholesterol   . Hx of blood clots   . Hypertension   . MI (myocardial infarction) ([place])    2007  . Obstructive sleep apnea    CPAP, setting ?2  . Palpitations   . Sleep apnea   . Tachycardia     Past Surgical History:  Procedure Laterality Date  . ABDOMINAL HYSTERECTOMY    . Arm Surgery Bilateral    due to fall-pt broke both forearms  . BIOPSY N/A 07/01/2013   Procedure: BIOPSY;  Surgeon: Rogene Houston, MD;  Location: [facility];  Service: Endoscopy;  Laterality: N/A;  . CHOLECYSTECTOMY    . COLONOSCOPY  08/06/2011   Procedure: COLONOSCOPY;  Surgeon: Rogene Houston, MD;  Location: [facility];  Service: Endoscopy;  Laterality: N/A;  215  . COLONOSCOPY WITH PROPOFOL N/A 12/31/2012   Procedure: COLONOSCOPY WITH PROPOFOL;  Surgeon: Rogene Houston, MD;  Location: [facility];  Service: Endoscopy;  Laterality: N/A;  in cecum at 0814, total withdrawal time 58mn  . COLONOSCOPY WITH PROPOFOL N/A 02/05/2018   Procedure: COLONOSCOPY WITH PROPOFOL;  Surgeon: RRogene Houston MD;  Location: [facility];  Service: Endoscopy;  Laterality: N/A;  1:25  . CORONARY ANGIOPLASTY WITH STENT PLACEMENT    .  CYSTOSCOPY W/ URETERAL STENT PLACEMENT Right 11/10/2019   at [facility]  . CYSTOSCOPY WITH RETROGRADE PYELOGRAM, URETEROSCOPY AND STENT PLACEMENT Right 12/22/2019   Procedure: CYSTOSCOPY WITH RIGHT URETERAL STENT REMOVAL; RIGHT RETROGRADE PYELOGRAM,RIGHT URETEROSCOPY;  Surgeon: Cleon Gustin, MD;  Location: [facility];  Service: Urology;  Laterality: Right;  . ESOPHAGEAL DILATION N/A 06/07/2015   Procedure: ESOPHAGEAL DILATION;  Surgeon: Rogene Houston, MD;  [facility];  Service: Endoscopy;  Laterality: N/A;  . ESOPHAGOGASTRODUODENOSCOPY N/A 06/07/2015   Procedure: ESOPHAGOGASTRODUODENOSCOPY (EGD);  Surgeon: Rogene Houston, MD;  [facility];  Service: Endoscopy;   Laterality: N/A;  2:45 - moved to 1:00 - Ann to notify pt  . ESOPHAGOGASTRODUODENOSCOPY (EGD) WITH PROPOFOL N/A 12/31/2012   Procedure: ESOPHAGOGASTRODUODENOSCOPY (EGD) WITH PROPOFOL;  Surgeon: Rogene Houston, MD;  Location: [facility];  Service: Endoscopy;  Laterality: N/A;  GE junction 37,  . ESOPHAGOGASTRODUODENOSCOPY (EGD) WITH PROPOFOL N/A 07/01/2013   Procedure: ESOPHAGOGASTRODUODENOSCOPY (EGD) WITH PROPOFOL;  Surgeon: Rogene Houston, MD;  Location: [facility];  Service: Endoscopy;  Laterality: N/A;  950  . MALONEY DILATION N/A 12/31/2012   Procedure: MALONEY DILATION;  Surgeon: Rogene Houston, MD;  Location: [facility];  Service: Endoscopy;  Laterality: N/A;  used a # 54,#56  . MALONEY DILATION N/A 07/01/2013   Procedure: Venia Minks DILATION;  Surgeon: Rogene Houston, MD;  Location: [facility];  Service: Endoscopy;  Laterality: N/A;  54/58; no heme present  . POLYPECTOMY  02/05/2018   Procedure: POLYPECTOMY;  Surgeon: Rogene Houston, MD;  [facility];  Service: Endoscopy;;  distal rectum (HS)  . STONE EXTRACTION WITH BASKET Right 12/22/2019   Procedure: STONE EXTRACTION WITH BASKET;  Surgeon: Cleon Gustin, MD;  Location: [facility];  Service: Urology;  Laterality: Right;     Medications Prior to Admission: Prior to Admission medications   Medication Sig Start Date End Date Taking? Authorizing Provider  ALPRAZolam Duanne Moron) 0.5 MG tablet Take 0.5 mg by mouth 2 (two) times daily as needed. 01/13/20   [provider]  Armodafinil 150 MG tablet Take 150 mg by mouth daily. 12/23/19   [provider]  aspirin EC 81 MG tablet Take 1 tablet (81 mg total) by mouth daily. 02/12/18   Rehman, Mechele Dawley, MD  atorvastatin (LIPITOR) 40 MG tablet Take 40 mg by mouth daily.    [provider]  B-D ULTRAFINE III SHORT PEN 31G X 8 MM MISC Inject into the skin 4 (four) times daily. 08/26/19   [provider]  buPROPion (WELLBUTRIN SR) 100 MG 12 hr tablet Take 100 mg by mouth  daily. 04/23/20   [provider]  Cholecalciferol (VITAMIN D3) 125 MCG (5000 UT) CAPS TAKE ONE CAPSULE BY MOUTH DAILY Patient taking differently: Take 5,000 Units by mouth daily. 04/04/19   Cassandria Anger, MD  clotrimazole-betamethasone (LOTRISONE) cream Apply to affected area 2 times daily for 21 days 12/22/19 12/21/20  Cleon Gustin, MD  diclofenac Sodium (VOLTAREN) 1 % GEL APPLY TO BOTH KNEES THREE TIMES DAILY 01/13/20   Kirsteins, Luanna Salk, MD  diltiazem (CARDIZEM CD) 240 MG 24 hr capsule Take 240 mg by mouth daily. 11/15/19   [provider]  doxepin (SINEQUAN) 25 MG capsule Take 75 mg by mouth at bedtime.  09/23/19   [provider]  estradiol (ESTRACE) 0.5 MG tablet Take 0.5 mg by mouth daily.  09/23/19   [provider]  FEROSUL 325 (65 Fe) MG tablet Take  325 mg by mouth every morning. 11/15/19   [provider]  furosemide (LASIX) 40 MG tablet Take 1 tablet (40 mg total) by mouth daily. 05/07/20 08/05/20  Arnoldo Lenis, MD  furosemide (LASIX) 40 MG tablet Take 1 tablet by mouth daily. 05/07/20 08/05/20  [provider]  gabapentin (NEURONTIN) 400 MG capsule Take 400 mg by mouth 3 (three) times daily. 11/24/16   [provider]  glipiZIDE (GLUCOTROL XL) 5 MG 24 hr tablet Take 2 tablets (10 mg total) by mouth daily with breakfast. 07/19/20   Nida, Marella Chimes, MD  lisinopril (ZESTRIL) 5 MG tablet Take 1 tablet (5 mg total) by mouth daily. 06/12/20   Arnoldo Lenis, MD  meloxicam (MOBIC) 7.5 MG tablet Take 7.5 mg by mouth in the morning and at bedtime.  02/10/18   Rehman, Mechele Dawley, MD  metFORMIN (GLUCOPHAGE) 500 MG tablet TAKE 1 TABLET BY MOUTH TWICE DAILY Patient taking differently: Take 500 mg by mouth 2 (two) times daily with a meal. 03/01/19   Nida, Marella Chimes, MD  metoCLOPramide (REGLAN) 5 MG tablet Take 5 mg by mouth 3 (three) times daily as needed for nausea or vomiting.  11/09/17   [provider]   metoprolol succinate (TOPROL-XL) 100 MG 24 hr tablet Take 100 mg by mouth daily. Take with or immediately following a meal.    [provider]  montelukast (SINGULAIR) 10 MG tablet Take 10 mg by mouth at bedtime.  09/23/19   [provider]  morphine (MS CONTIN) 30 MG 12 hr tablet Take 1 tablet (30 mg total) by mouth every 12 (twelve) hours. 07/13/20   Bayard Hugger, NP  nitroGLYCERIN (NITROSTAT) 0.4 MG SL tablet Place 1 tablet (0.4 mg total) under the tongue every 5 (five) minutes x 3 doses as needed. For chest pains 10/11/13   Herminio Commons, MD  NOVOLOG FLEXPEN 100 UNIT/ML FlexPen Inject into the skin. 01/05/20   [provider]  ondansetron (ZOFRAN) 4 MG tablet Take 4 mg by mouth every 6 (six) hours as needed for nausea or vomiting.    [provider]  pantoprazole (PROTONIX) 40 MG tablet TAKE 1 TABLET BY MOUTH TWICE DAILY BEFORE MEALS Patient taking differently: Take 40 mg by mouth 2 (two) times daily before a meal. 06/29/19   Rehman, Mechele Dawley, MD  polyethylene glycol powder (GLYCOLAX/MIRALAX) 17 GM/SCOOP powder Take 1 Container by mouth daily as needed for moderate constipation.  08/10/19   [provider]  potassium chloride (K-DUR,KLOR-CON) 10 MEQ tablet Take 10 mEq by mouth 2 (two) times daily. 11/24/16   [provider]  PROAIR HFA 108 (90 Base) MCG/ACT inhaler Inhale 2 puffs into the lungs every 4 (four) hours as needed for wheezing or shortness of breath.  11/24/16   [provider]  promethazine (PHENERGAN) 25 MG tablet Take 1 tablet (25 mg total) by mouth 4 (four) times daily as needed. 12/29/19   McKenzie, Candee Furbish, MD  sucralfate (CARAFATE) 1 g tablet Take 1 g by mouth 4 (four) times daily. 12/26/19   [provider]  SURE COMFORT INS SYR 1CC/30G 30G X 5/16" 1 ML MISC USE WITH TRESIBA 01/25/20   [provider]  topiramate (TOPAMAX) 200 MG tablet Take 200 mg by mouth at bedtime. 11/24/16   [provider]  TRESIBA FLEXTOUCH 100 UNIT/ML FlexTouch Pen Inject 80 Units into the skin at bedtime. 07/23/20   Cassandria Anger, MD     Allergies:  Allergies  Allergen Reactions  . Amphetamine-Dextroamphetamine Swelling  . Nitrofuran Derivatives Itching and Swelling  . Amphetamine-Dextroamphet Er Swelling  . Pregabalin Swelling  . Topiramate Other (See Comments)    Tongue tingle  . Verelan [Verapamil] Rash    Social History:   Social History   Socioeconomic History  . Marital status: Divorced    Spouse name: Not on file  . Number of children: 4  . Years of education: Not on file  . Highest education level: Not on file  Occupational History  . Occupation: Disabled  Tobacco Use  . Smoking status: Never Smoker  . Smokeless tobacco: Never Used  Vaping Use  . Vaping Use: Never used  Substance and Sexual Activity  . Alcohol use: No    Alcohol/week: 0.0 standard drinks  . Drug use: No  . Sexual activity: Not on file  Other Topics Concern  . Not on file  Social History Narrative   Patient does not get regular exercise   Denies caffeine use    Social Determinants of Health   Financial Resource Strain: Not on file  Food Insecurity: Not on file  Transportation Needs: Not on file  Physical Activity: Not on file  Stress: Not on file  Social Connections: Not on file  Intimate Partner Violence: Not on file    Family History:   The patient's family history includes Cancer in her brother, brother, maternal aunt, maternal uncle, paternal aunt, paternal uncle, and another family member; Colon cancer in her mother; Coronary artery disease in an other family member; Heart attack in her father and mother; Hemophilia in her child; Stroke in her sister.    ROS:  Please see the history of present illness.  All other ROS reviewed and negative.     Physical Exam/Data:   Vitals:   08/07/20 2230 08/07/20 2330 08/08/20 0051 08/08/20 0053  BP: (!) 182/89 135/79  (!) 170/84   Pulse: 95 91  97  Resp: 17 18  16   Temp:    99 F (37.2 C)  TempSrc:    Oral  SpO2: 100% 98%  99%  Weight:   110 kg   Height:   5' 4"  (1.626 m)     Intake/Output Summary (Last 24 hours) at 08/08/2020 0148 Last data filed at 08/08/2020 0100 Gross per 24 hour  Intake 222 ml  Output --  Net 222 ml   Last 3 Weights 08/08/2020 08/07/2020 08/07/2020  Weight (lbs) 242 lb 8.1 oz 226 lb 245 lb  Weight (kg) 110 kg 102.513 kg 111.131 kg     Body mass index is 41.63 kg/m.  General:  No acute distress HEENT: normal Lymph: no adenopathy Neck: no JVD Endocrine:  No thryomegaly Vascular: No carotid bruits; FA pulses 2+ bilaterally without bruits  Cardiac:  normal S1, S2; RRR; no murmur  Lungs:  clear to auscultation bilaterally, no wheezing, rhonchi or rales  Abd: soft, nontender, no hepatomegaly  Ext: 1+ pitting edema bilaterally Musculoskeletal:  No deformities, BUE and BLE strength normal and equal Skin: warm and dry  Neuro:  CNs 2-12 intact, no focal abnormalities noted Psych:  Normal affect    EKG:  The ECG that was done  was personally reviewed and demonstrates sinus rhythm no acute changes  Relevant CV Studies: none  Laboratory Data:  High Sensitivity Troponin:   Recent Labs  Lab 08/07/20 1935  TROPONINIHS 5      Chemistry Recent Labs  Lab 08/07/20 1935  NA 138  K 4.2  CL 101  CO2 29  GLUCOSE 184*  BUN 15  CREATININE 0.68  CALCIUM 9.1  GFRNONAA >60  ANIONGAP 8    No results for input(s): PROT, ALBUMIN, AST, ALT, ALKPHOS, BILITOT in the last 168 hours. Hematology Recent Labs  Lab 08/07/20 1935  WBC 11.6*  RBC 5.39*  HGB 11.8*  HCT 40.6  MCV 75.3*  MCH 21.9*  MCHC 29.1*  RDW 16.1*  PLT 411*   BNPNo results for input(s): BNP, PROBNP in the last 168 hours.  DDimer No results for input(s): DDIMER in the last 168 hours.   Radiology/Studies:  DG Chest Portable 1 View  Result Date: 08/07/2020 CLINICAL DATA:  Chest pain and shortness of breath.  EXAM: PORTABLE CHEST 1 VIEW COMPARISON:  Radiograph yesterday at [facility].  CT 06/19/2020 FINDINGS: Upper normal heart size.The cardiomediastinal contours are normal. The lungs are clear. Pulmonary vasculature is normal. No consolidation, pleural effusion, or pneumothorax. No acute osseous abnormalities are seen. IMPRESSION: No acute chest findings.  No change from radiographs yesterday. Electronically Signed   By: Keith Rake M.D.   On: 08/07/2020 18:22     Assessment and Plan:   1. Chest pain.  Given her story and reproducibility on exam, she gets the exact pain when palpating her central chest, I am less likely inclined to believe that this is coronary ischemia driving her chest pain.  She does have a rash under her left breast, however this is an intertrigo rash.  I do not think that she needs an acute coronary evaluation.  We will order an echo just to evaluate and place her in observation.  Her pain appears to be musculoskeletal in nature.  And she tends to have pain on palpation when I touch globally.  Fortunately, troponins were also ruled out at the outside emergency department.  We will repeat basic labs in the morning. Will repeat tropoinin with AM labs to be sure.  2. HTN.  Continue home antihypertensives 3. DM2.  Hold oral meds and start sliding scale 4. Anxiety depression.  Continue home meds.   Risk Assessment/Risk Scores:    HEAR Score (for undifferentiated chest pain):  HEAR Score: 5       Severity of Illness: The appropriate patient status for this patient is OBSERVATION. Observation status is judged to be reasonable and necessary in order to provide the required intensity of service to ensure the patient's safety. The patient's presenting symptoms, physical exam findings, and initial radiographic and laboratory data in the context of their medical condition is felt to place them at decreased risk for further clinical deterioration. Furthermore, it is anticipated that  the patient will be medically stable for discharge from the hospital within 2 midnights of admission. The following factors support the patient status of observation.   " The patient's presenting symptoms include chest pain. " The physical exam findings include reproducible chest pain. " The initial radiographic and laboratory data are negative troponin.     For questions or updates, please contact [place] Please consult [email or web address] for contact info under     Signed, Doyne Keel, MD  08/08/2020 1:48 AM

## 2020-08-08 NOTE — Plan of Care (Signed)

## 2020-08-09 ENCOUNTER — Telehealth: Payer: Self-pay | Admitting: *Deleted

## 2020-08-09 NOTE — Telephone Encounter (Signed)
-----   Message from Nuala Alpha, LPN sent at 4/59/9774 11:59 AM EDT ----- Dr. Johney Frame wanted me to get this to you so you can follow with the pt, since it came to her and not you guys.  She did give some advice on this pts A1C, that was done at the hospital.   Thanks, Karlene Einstein  ----- Message ----- From: Freada Bergeron, MD Sent: 08/09/2020  11:10 AM EDT To: Nuala Alpha, LPN  Looks like this was checked in the hospital but her A1C is very elevated at 11.2. Can we refer her to endocrine as she needs optimization of her DM medications.

## 2020-08-09 NOTE — Telephone Encounter (Signed)
Patient informed and says her endocrionologist is Dr. Dorris Fetch in [place]. Advised that results will be sent to his office and that she needed to contact their office for an evaluation ASAP. Verbalized understanding of plan.

## 2020-08-10 ENCOUNTER — Encounter: Payer: Self-pay | Admitting: Registered Nurse

## 2020-08-10 ENCOUNTER — Other Ambulatory Visit: Payer: Self-pay

## 2020-08-10 ENCOUNTER — Encounter: Payer: Medicare Other | Attending: Registered Nurse | Admitting: Registered Nurse

## 2020-08-10 VITALS — BP 126/63 | Ht 64.0 in | Wt 242.2 lb

## 2020-08-10 DIAGNOSIS — Z5181 Encounter for therapeutic drug level monitoring: Secondary | ICD-10-CM | POA: Insufficient documentation

## 2020-08-10 DIAGNOSIS — M7061 Trochanteric bursitis, right hip: Secondary | ICD-10-CM | POA: Insufficient documentation

## 2020-08-10 DIAGNOSIS — M5412 Radiculopathy, cervical region: Secondary | ICD-10-CM | POA: Insufficient documentation

## 2020-08-10 DIAGNOSIS — M48062 Spinal stenosis, lumbar region with neurogenic claudication: Secondary | ICD-10-CM | POA: Diagnosis not present

## 2020-08-10 DIAGNOSIS — M542 Cervicalgia: Secondary | ICD-10-CM | POA: Diagnosis not present

## 2020-08-10 DIAGNOSIS — M546 Pain in thoracic spine: Secondary | ICD-10-CM | POA: Insufficient documentation

## 2020-08-10 DIAGNOSIS — M1712 Unilateral primary osteoarthritis, left knee: Secondary | ICD-10-CM | POA: Insufficient documentation

## 2020-08-10 DIAGNOSIS — I200 Unstable angina: Secondary | ICD-10-CM

## 2020-08-10 DIAGNOSIS — Z79891 Long term (current) use of opiate analgesic: Secondary | ICD-10-CM | POA: Insufficient documentation

## 2020-08-10 DIAGNOSIS — M7062 Trochanteric bursitis, left hip: Secondary | ICD-10-CM | POA: Insufficient documentation

## 2020-08-10 DIAGNOSIS — G894 Chronic pain syndrome: Secondary | ICD-10-CM | POA: Insufficient documentation

## 2020-08-10 DIAGNOSIS — M1711 Unilateral primary osteoarthritis, right knee: Secondary | ICD-10-CM | POA: Insufficient documentation

## 2020-08-10 DIAGNOSIS — M5416 Radiculopathy, lumbar region: Secondary | ICD-10-CM | POA: Insufficient documentation

## 2020-08-10 MED ORDER — MORPHINE SULFATE ER 30 MG PO TBCR: 30.0000 mg | EXTENDED_RELEASE_TABLET | Freq: Two times a day (BID) | ORAL | 0 refills | Status: DC

## 2020-08-10 NOTE — Telephone Encounter (Signed)
Called pt, no answer, VM is full.

## 2020-08-10 NOTE — Progress Notes (Signed)
Subjective:    Patient ID: [name], female    DOB: 1950/12/29, 70 y.o.   MRN: [id number]  HPI: [name] is a 70 y.o. female whose appointment was changed to a My-Chart video visit , [name] called office requesting My-Chart video visit. She was recently discharged from the hospital. Her appointment was changed to a My=Chart Video visit. [name] agrees with My-Chart Video visit and verbalizes understanding. She states her  pain is located in her neck radiating into her left shoulder, mid- lower back pain radiating into her bilateral hips and left lower extremity. She rates her pain 8. Her current exercise regime is walking.   [name] was admitted to [facility] on 08/07/2020 and discharged on 08/08/2020 for chest pain, note was reviewed.   [name] Morphine equivalent is 60.00 MME. She  is also prescribed Alprazolam by Dr. Woody Seller. We have discussed the black box warning of using opioids and benzodiazepines. I highlighted the dangers of using these drugs together and discussed the adverse events including respiratory suppression, overdose, cognitive impairment and importance of compliance with current regimen. We will continue to monitor and adjust as indicated.   Last UDS was Performed on 05/22/2020, it was consistent.    Pain Inventory Average Pain 8 Pain Right Now 8 My pain is constant, sharp and aching  In the last 24 hours, has pain interfered with the following? General activity 8 Relation with others 8 Enjoyment of life 8 What TIME of day is your pain at its worst? varies Sleep (in general) Poor  Pain is worse with: walking, bending and some activites Pain improves with: rest and medication Relief from Meds: 3  Family History  Problem Relation Age of Onset  . Heart attack Mother   . Colon cancer Mother   . Heart attack Father   . Stroke Sister   . Hemophilia Child   . Cancer Other   . Coronary artery disease Other   . Cancer Brother   .  Cancer Maternal Aunt   . Cancer Maternal Uncle   . Cancer Paternal Aunt   . Cancer Paternal Uncle   . Cancer Brother    Social History   Socioeconomic History  . Marital status: Divorced    Spouse name: Not on file  . Number of children: 4  . Years of education: Not on file  . Highest education level: Not on file  Occupational History  . Occupation: Disabled  Tobacco Use  . Smoking status: Never Smoker  . Smokeless tobacco: Never Used  Vaping Use  . Vaping Use: Never used  Substance and Sexual Activity  . Alcohol use: No    Alcohol/week: 0.0 standard drinks  . Drug use: No  . Sexual activity: Not on file  Other Topics Concern  . Not on file  Social History Narrative   Patient does not get regular exercise   Denies caffeine use    Social Determinants of Health   Financial Resource Strain: Not on file  Food Insecurity: Not on file  Transportation Needs: Not on file  Physical Activity: Not on file  Stress: Not on file  Social Connections: Not on file   Past Surgical History:  Procedure Laterality Date  . ABDOMINAL HYSTERECTOMY    . Arm Surgery Bilateral    due to fall-pt broke both forearms  . BIOPSY N/A 07/01/2013   Procedure: BIOPSY;  Surgeon: Rogene Houston, MD;  Location: [facility];  Service: Endoscopy;  Laterality: N/A;  . CHOLECYSTECTOMY    . COLONOSCOPY  08/06/2011   Procedure: COLONOSCOPY;  Surgeon: Rogene Houston, MD;  [facility];  Service: Endoscopy;  Laterality: N/A;  215  . COLONOSCOPY WITH PROPOFOL N/A 12/31/2012   Procedure: COLONOSCOPY WITH PROPOFOL;  Surgeon: Rogene Houston, MD;  Location: [facility];  Service: Endoscopy;  Laterality: N/A;  in cecum at 0814, total withdrawal time 90mn  . COLONOSCOPY WITH PROPOFOL N/A 02/05/2018   Procedure: COLONOSCOPY WITH PROPOFOL;  Surgeon: RRogene Houston MD;  [facility];  Service: Endoscopy;  Laterality: N/A;  1:25  . CORONARY ANGIOPLASTY WITH STENT PLACEMENT    . CYSTOSCOPY W/ URETERAL  STENT PLACEMENT Right 11/10/2019   at [facility] . CYSTOSCOPY WITH RETROGRADE PYELOGRAM, URETEROSCOPY AND STENT PLACEMENT Right 12/22/2019   Procedure: CYSTOSCOPY WITH RIGHT URETERAL STENT REMOVAL; RIGHT RETROGRADE PYELOGRAM,RIGHT URETEROSCOPY;  Surgeon: MCleon Gustin MD;  [facility]: [facility];  Service: Urology;  Laterality: Right;  . ESOPHAGEAL DILATION N/A 06/07/2015   Procedure: ESOPHAGEAL DILATION;  Surgeon: NRogene Houston MD;  [facility];  Service: Endoscopy;  Laterality: N/A;  . ESOPHAGOGASTRODUODENOSCOPY N/A 06/07/2015   Procedure: ESOPHAGOGASTRODUODENOSCOPY (EGD);  Surgeon: NRogene Houston MD;  [facility];  Service: Endoscopy;  Laterality: N/A;  2:45 - moved to 1:00 - Ann to notify pt  . ESOPHAGOGASTRODUODENOSCOPY (EGD) WITH PROPOFOL N/A 12/31/2012   Procedure: ESOPHAGOGASTRODUODENOSCOPY (EGD) WITH PROPOFOL;  Surgeon: NRogene Houston MD;  [facility];  Service: Endoscopy;  Laterality: N/A;  GE junction 37,  . ESOPHAGOGASTRODUODENOSCOPY (EGD) WITH PROPOFOL N/A 07/01/2013   Procedure: ESOPHAGOGASTRODUODENOSCOPY (EGD) WITH PROPOFOL;  Surgeon: NRogene Houston MD;  [facility];  Service: Endoscopy;  Laterality: N/A;  950  . MALONEY DILATION N/A 12/31/2012   Procedure: MALONEY DILATION;  Surgeon: NRogene Houston MD;  [facility]: [facility];  Service: Endoscopy;  Laterality: N/A;  used a # 54,#56  . MALONEY DILATION N/A 07/01/2013   Procedure: MVenia MinksDILATION;  Surgeon: NRogene Houston MD;  [facility];  Service: Endoscopy;  Laterality: N/A;  54/58; no heme present  . POLYPECTOMY  02/05/2018   Procedure: POLYPECTOMY;  Surgeon: RRogene Houston MD;  [facility];  Service: Endoscopy;;  distal rectum (HS)  . STONE EXTRACTION WITH BASKET Right 12/22/2019   Procedure: STONE EXTRACTION WITH BASKET;  Surgeon: MCleon Gustin MD;  [facility];  Service: Urology;  Laterality: Right;   Past Surgical History:  Procedure Laterality Date  .  ABDOMINAL HYSTERECTOMY    . Arm Surgery Bilateral    due to fall-pt broke both forearms  . BIOPSY N/A 07/01/2013   Procedure: BIOPSY;  Surgeon: NRogene Houston MD;  [facility];  Service: Endoscopy;  Laterality: N/A;  . CHOLECYSTECTOMY    . COLONOSCOPY  08/06/2011   Procedure: COLONOSCOPY;  Surgeon: NRogene Houston MD;  [facility];  Service: Endoscopy;  Laterality: N/A;  215  . COLONOSCOPY WITH PROPOFOL N/A 12/31/2012   Procedure: COLONOSCOPY WITH PROPOFOL;  Surgeon: NRogene Houston MD;  [facility];  Service: Endoscopy;  Laterality: N/A;  in cecum at 0814, total withdrawal time 143m  . COLONOSCOPY WITH PROPOFOL N/A 02/05/2018   Procedure: COLONOSCOPY WITH PROPOFOL;  Surgeon: ReRogene HoustonMD;  [facility];  Service: Endoscopy;  Laterality: N/A;  1:25  . CORONARY ANGIOPLASTY WITH STENT PLACEMENT    . CYSTOSCOPY W/ URETERAL STENT PLACEMENT Right  11/10/2019   at [facility]  . CYSTOSCOPY WITH RETROGRADE PYELOGRAM, URETEROSCOPY AND STENT PLACEMENT Right 12/22/2019   Procedure: CYSTOSCOPY WITH RIGHT URETERAL STENT REMOVAL; RIGHT RETROGRADE PYELOGRAM,RIGHT URETEROSCOPY;  Surgeon: Cleon Gustin, MD;  [facility];  Service: Urology;  Laterality: Right;  . ESOPHAGEAL DILATION N/A 06/07/2015   Procedure: ESOPHAGEAL DILATION;  Surgeon: Rogene Houston, MD;  [facility];  Service: Endoscopy;  Laterality: N/A;  . ESOPHAGOGASTRODUODENOSCOPY N/A 06/07/2015   Procedure: ESOPHAGOGASTRODUODENOSCOPY (EGD);  Surgeon: Rogene Houston, MD;  [facility];  Service: Endoscopy;  Laterality: N/A;  2:45 - moved to 1:00 - Ann to notify pt  . ESOPHAGOGASTRODUODENOSCOPY (EGD) WITH PROPOFOL N/A 12/31/2012   Procedure: ESOPHAGOGASTRODUODENOSCOPY (EGD) WITH PROPOFOL;  Surgeon: Rogene Houston, MD;  [facility]: [facility];  Service: Endoscopy;  Laterality: N/A;  GE junction 37,  . ESOPHAGOGASTRODUODENOSCOPY (EGD) WITH PROPOFOL N/A 07/01/2013   Procedure:  ESOPHAGOGASTRODUODENOSCOPY (EGD) WITH PROPOFOL;  Surgeon: Rogene Houston, MD;  [facility];  Service: Endoscopy;  Laterality: N/A;  950  . MALONEY DILATION N/A 12/31/2012   Procedure: MALONEY DILATION;  Surgeon: Rogene Houston, MD;  [facility]: [facility];  Service: Endoscopy;  Laterality: N/A;  used a # 54,#56  . MALONEY DILATION N/A 07/01/2013   Procedure: Venia Minks DILATION;  Surgeon: Rogene Houston, MD;  [facility]: [facility];  Service: Endoscopy;  Laterality: N/A;  54/58; no heme present  . POLYPECTOMY  02/05/2018   Procedure: POLYPECTOMY;  Surgeon: Rogene Houston, MD;  [facility];  Service: Endoscopy;;  distal rectum (HS)  . STONE EXTRACTION WITH BASKET Right 12/22/2019   Procedure: STONE EXTRACTION WITH BASKET;  Surgeon: Cleon Gustin, MD;  Location: [facility];  Service: Urology;  Laterality: Right;   Past Medical History:  Diagnosis Date  . Anxiety and depression   . Arthritis   . CAD (coronary artery disease)    Stent placement circumflex coronary 2007, catheterization 2008 patent stents. Normal LV function  . Chest pain   . CHF (congestive heart failure) ([place])   . COPD (chronic obstructive pulmonary disease) (H[facility])    no home O2  . Depression   . Diabetes mellitus    Insulin dependent  . Diabetic polyneuropathy (H[facility])     severe on multiple medications  . Dyslipidemia   . GERD (gastroesophageal reflux disease)   . Headache(784.0)   . Heart attack ([place])   . Heartburn   . 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 High cholesterol   . Hx of blood clots   . Hypertension   . MI (myocardial infarction) ([place])    2007  . Obstructive sleep apnea    CPAP, setting ?2  . Palpitations   . Sleep apnea   . Tachycardia    BP 126/63   Ht 5' 4"  (1.626 m)   Wt 242 lb 3.2 oz (109.9 kg)   BMI 41.57 kg/m   Opioid Risk Score:   Fall Risk Score:  `1  Depression screen PHQ 2/9  Depression screen [facility] 2/9 01/24/2020 11/18/2019 10/26/2019 09/26/2019 03/24/2019 07/16/2018 06/12/2017  Decreased  Interest 0 1 0 0 1 1 3   Down, Depressed, Hopeless 0 1 0 0 1 1 3   PHQ - 2 Score 0 2 0 0 2 2 6   Altered sleeping - - - - - - 3  Tired, decreased energy - - - - - - 3  Change in appetite - - - - - - 0  Feeling bad or  failure about yourself  - - - - - - 3  Trouble concentrating - - - - - - 3  Moving slowly or fidgety/restless - - - - - - 1  Suicidal thoughts - - - - - - 0  PHQ-9 Score - - - - - - 19  Difficult doing work/chores - - - - - - Very difficult  Some recent data might be hidden      Review of Systems  Constitutional: Negative.   HENT: Negative.   Eyes: Negative.   Respiratory: Negative.   Cardiovascular: Negative.   Gastrointestinal: Negative.   Endocrine: Negative.   Genitourinary: Negative.   Musculoskeletal: Positive for gait problem.       PAIN IN LEGS , WHOLE BODY ACHING  Skin: Negative.   Allergic/Immunologic: Negative.   Hematological: Negative.   Psychiatric/Behavioral: Negative.        Objective:   Physical Exam Vitals and nursing note reviewed.  Musculoskeletal:     Comments: No Physical Exam Performed: My-Chart Video Visit           Assessment & Plan:  1.Lumbar pain lumbar spondylosis: Encouraged to continue toincrease Activity as tolerated.08/10/2020 Continue current medication regimen. Refilled:MS Contin 30 MG one tablet every12hours #60. BoyfriendJoe is dispensing Ms. Makara Medication she reports. We will continue the opioid monitoring program, this consists of regular clinic visits, examinations, urine drug screen, pill counts as well as use of [place] Controlled Substance Reporting system. A 12 month History has been reviewed on the [place] Controlled Substance Reporting Systemon05/27/2022. 2. Lumbar Radiculitis: Continuecurrent medication regimen withGabapentin.08/10/2020 3. Bilateral Knee Pain/ Degenerative:Continue Voltaren Gel. Continue to Monitor.08/10/2020 4. Severe diabetic poly neuropathy:  Continuecurrent medication regimen withGabapentin.08/10/2020 5. Insomnia: ContinueArmodafinilPsychiatry Following.08/10/2020 6.Anxiety/Depression:Continue Current Medication regimen as prescribed byPCP andPsychiatry.08/10/2020 7. De Quervains Tenosynovitis: No Complaints Today.08/10/2020 8.BilateralGreater Trochanteric Bursitis:Continue with Ice and Heat Therapy. Continue to Monitor.08/10/2020. 9. Cervicalgia/ Cervical Radiculitis:Continue Gabapentin.Continue with HEP as Tolerated and continue to monitor.08/10/2020 10. Chronic Bilateral Thoracic Back Pain: Continue HEP as Tolerated. Continue current medication regimen. Continue to monitor.08/10/2020.  F/U in 1 month  My-Chart Video Visit/ telephone: 100% audio Established Patient Location of Patient: In her Home Location of Provider: In the Office

## 2020-08-16 ENCOUNTER — Ambulatory Visit: Payer: Medicare Other | Admitting: "Endocrinology

## 2020-08-17 ENCOUNTER — Telehealth: Payer: Self-pay | Admitting: "Endocrinology

## 2020-08-17 MED ORDER — BLOOD GLUCOSE MONITOR KIT: 1.0000 | PACK | Freq: Two times a day (BID) | 0 refills | Status: DC

## 2020-08-17 NOTE — Telephone Encounter (Signed)
Rx sent 

## 2020-08-17 NOTE — Telephone Encounter (Signed)
Patient calling asking if we can call in a new meter to [facility] due to hers not working anymore. She said she is unsure what the name of it is. Please advise.

## 2020-08-20 ENCOUNTER — Ambulatory Visit (INDEPENDENT_AMBULATORY_CARE_PROVIDER_SITE_OTHER): Payer: Medicare Other | Admitting: "Endocrinology

## 2020-08-20 ENCOUNTER — Encounter: Payer: Self-pay | Admitting: "Endocrinology

## 2020-08-20 VITALS — BP 160/78 | HR 84 | Ht 64.0 in | Wt 264.0 lb

## 2020-08-20 DIAGNOSIS — E1159 Type 2 diabetes mellitus with other circulatory complications: Secondary | ICD-10-CM

## 2020-08-20 DIAGNOSIS — E782 Mixed hyperlipidemia: Secondary | ICD-10-CM

## 2020-08-20 DIAGNOSIS — I10 Essential (primary) hypertension: Secondary | ICD-10-CM | POA: Diagnosis not present

## 2020-08-20 DIAGNOSIS — I200 Unstable angina: Secondary | ICD-10-CM | POA: Diagnosis not present

## 2020-08-20 DIAGNOSIS — E559 Vitamin D deficiency, unspecified: Secondary | ICD-10-CM

## 2020-08-20 MED ORDER — FIASP FLEXTOUCH 100 UNIT/ML ~~LOC~~ SOPN: 10.0000 [IU] | PEN_INJECTOR | Freq: Three times a day (TID) | SUBCUTANEOUS | 2 refills | Status: DC

## 2020-08-20 MED ORDER — TRESIBA FLEXTOUCH 200 UNIT/ML ~~LOC~~ SOPN: 90.0000 [IU] | PEN_INJECTOR | Freq: Every day | SUBCUTANEOUS | 2 refills | Status: DC

## 2020-08-20 NOTE — Progress Notes (Signed)
08/20/2020   Endocrinology follow-up note   Subjective:    Patient ID: [name], female    DOB: 04-19-1950. Patient is returning with worsening glycemic profile postprandially. She has been on regular follow-up for  management of currently uncontrolled, complicated type 2 diabetes; hyperlipidemia; hypertension.  Past Medical History:  Diagnosis Date  . Anxiety and depression   . Arthritis   . CAD (coronary artery disease)    Stent placement circumflex coronary 2007, catheterization 2008 patent stents. Normal LV function  . Chest pain   . CHF (congestive heart failure) ([place])   . COPD (chronic obstructive pulmonary disease) (HCC)    no home O2  . Depression   . Diabetes mellitus    Insulin dependent  . Diabetic polyneuropathy (H[facility])     severe on multiple medications  . Dyslipidemia   . GERD (gastroesophageal reflux disease)   . Headache(784.0)   . Heart attack ([place])   . Heartburn   . 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 High cholesterol   . Hx of blood clots   . Hypertension   . MI (myocardial infarction) ([place])    2007  . Obstructive sleep apnea    CPAP, setting ?2  . Palpitations   . Sleep apnea   . Tachycardia    Past Surgical History:  Procedure Laterality Date  . ABDOMINAL HYSTERECTOMY    . Arm Surgery Bilateral    due to fall-pt broke both forearms  . BIOPSY N/A 07/01/2013   Procedure: BIOPSY;  Surgeon: Rogene Houston, MD;  Location: [facility];  Service: Endoscopy;  Laterality: N/A;  . CHOLECYSTECTOMY    . COLONOSCOPY  08/06/2011   Procedure: COLONOSCOPY;  Surgeon: Rogene Houston, MD;  [facility];  Service: Endoscopy;  Laterality: N/A;  215  . COLONOSCOPY WITH PROPOFOL N/A 12/31/2012   Procedure: COLONOSCOPY WITH PROPOFOL;  Surgeon: Rogene Houston, MD;  Location: [facility];  Service: Endoscopy;  Laterality: N/A;  in cecum at 0814, total withdrawal time 80mn  . COLONOSCOPY WITH PROPOFOL N/A 02/05/2018   Procedure: COLONOSCOPY WITH PROPOFOL;  Surgeon:  RRogene Houston MD;  [facility];  Service: Endoscopy;  Laterality: N/A;  1:25  . CORONARY ANGIOPLASTY WITH STENT PLACEMENT    . CYSTOSCOPY W/ URETERAL STENT PLACEMENT Right [facility]/10/2019   [facility] CYSTOSCOPY WITH RETROGRADE PYELOGRAM, URETEROSCOPY AND STENT PLACEMENT Right 12/22/2019   Procedure: CYSTOSCOPY WITH RIGHT URETERAL STENT REMOVAL; RIGHT RETROGRADE PYELOGRAM,RIGHT URETEROSCOPY;  Surgeon: MCleon Gustin MD;  Location: [facility];  Service: Urology;  Laterality: Right;  . ESOPHAGEAL DILATION N/A 06/07/2015   Procedure: ESOPHAGEAL DILATION;  Surgeon: NRogene Houston MD;  [facility];  Service: Endoscopy;  Laterality: N/A;  . ESOPHAGOGASTRODUODENOSCOPY N/A 06/07/2015   Procedure: ESOPHAGOGASTRODUODENOSCOPY (EGD);  Surgeon: NRogene Houston MD;  [facility];  Service: Endoscopy;  Laterality: N/A;  2:45 - moved to 1:00 - Ann to notify pt  . ESOPHAGOGASTRODUODENOSCOPY (EGD) WITH PROPOFOL N/A 12/31/2012   Procedure: ESOPHAGOGASTRODUODENOSCOPY (EGD) WITH PROPOFOL;  Surgeon: NRogene Houston MD;  Location: [facility];  Service: Endoscopy;  Laterality: N/A;  GE junction 37,  . ESOPHAGOGASTRODUODENOSCOPY (EGD) WITH PROPOFOL N/A 07/01/2013   Procedure: ESOPHAGOGASTRODUODENOSCOPY (EGD) WITH PROPOFOL;  Surgeon: NRogene Houston MD;  Location: [facility];  Service: Endoscopy;  Laterality: N/A;  950  . MALONEY DILATION N/A 12/31/2012   Procedure: MALONEY DILATION;  Surgeon: NRogene Houston MD;  Location: [facility];  Service:  Endoscopy;  Laterality: N/A;  used a # 54,#56  . MALONEY DILATION N/A 07/01/2013   Procedure: Venia Minks DILATION;  Surgeon: Rogene Houston, MD;  Location: [facility];  Service: Endoscopy;  Laterality: N/A;  54/58; no heme present  . POLYPECTOMY  02/05/2018   Procedure: POLYPECTOMY;  Surgeon: Rogene Houston, MD;  [facility];  Service: Endoscopy;;  distal rectum (HS)  . STONE EXTRACTION WITH BASKET Right 12/22/2019   Procedure: STONE EXTRACTION  WITH BASKET;  Surgeon: Cleon Gustin, MD;  Location: [facility];  Service: Urology;  Laterality: Right;   Social History   Socioeconomic History  . Marital status: Divorced    Spouse name: Not on file  . Number of children: 4  . Years of education: Not on file  . Highest education level: Not on file  Occupational History  . Occupation: Disabled  Tobacco Use  . Smoking status: Never Smoker  . Smokeless tobacco: Never Used  Vaping Use  . Vaping Use: Never used  Substance and Sexual Activity  . Alcohol use: No    Alcohol/week: 0.0 standard drinks  . Drug use: No  . Sexual activity: Not on file  Other Topics Concern  . Not on file  Social History Narrative   Patient does not get regular exercise   Denies caffeine use    Social Determinants of Health   Financial Resource Strain: Not on file  Food Insecurity: Not on file  Transportation Needs: Not on file  Physical Activity: Not on file  Stress: Not on file  Social Connections: Not on file   Outpatient Encounter Medications as of 08/20/2020  Medication Sig  . insulin aspart (FIASP FLEXTOUCH) 100 UNIT/ML FlexTouch Pen Inject 10-16 Units into the skin 3 (three) times daily before meals.  . insulin degludec (TRESIBA FLEXTOUCH) 200 UNIT/ML FlexTouch Pen Inject 90 Units into the skin at bedtime.  . [DISCONTINUED] TRESIBA FLEXTOUCH 100 UNIT/ML FlexTouch Pen Inject 80 Units into the skin at bedtime. (Patient taking differently: Inject 90 Units into the skin at bedtime.)  . ALPRAZolam (XANAX) 0.5 MG tablet Take 0.5 mg by mouth 2 (two) times daily as needed for anxiety or sleep.  . Armodafinil 150 MG tablet Take 150 mg by mouth daily.  Marland Kitchen aspirin EC 81 MG tablet Take 1 tablet (81 mg total) by mouth daily.  Marland Kitchen atorvastatin (LIPITOR) 40 MG tablet Take 40 mg by mouth daily.  . B-D ULTRAFINE III SHORT PEN 31G X 8 MM MISC Inject into the skin 4 (four) times daily.  . blood glucose meter kit and supplies KIT 1 each by Does not apply route 2  (two) times daily. Dispense based on patient and insurance preference. Use bid as directed. E11.65  . buPROPion (WELLBUTRIN SR) 100 MG 12 hr tablet Take 100 mg by mouth daily.  . Cholecalciferol (VITAMIN D3) 125 MCG (5000 UT) CAPS TAKE ONE CAPSULE BY MOUTH DAILY (Patient taking differently: Take 5,000 Units by mouth daily.)  . clotrimazole-betamethasone (LOTRISONE) cream Apply to affected area 2 times daily for 21 days (Patient taking differently: Apply 1 application topically 2 (two) times daily.)  . diclofenac Sodium (VOLTAREN) 1 % GEL APPLY TO BOTH KNEES THREE TIMES DAILY (Patient taking differently: Apply 2 g topically in the morning, at noon, and at bedtime.)  . diltiazem (CARDIZEM CD) 240 MG 24 hr capsule Take 240 mg by mouth daily.  Marland Kitchen doxepin (SINEQUAN) 25 MG capsule Take 75 mg by mouth at bedtime.   Marland Kitchen estradiol (ESTRACE)  0.5 MG tablet Take 0.5 mg by mouth daily.   . FEROSUL 325 (65 Fe) MG tablet Take 325 mg by mouth every morning.  . furosemide (LASIX) 40 MG tablet Take 1 tablet by mouth daily.  Marland Kitchen gabapentin (NEURONTIN) 400 MG capsule Take 400 mg by mouth 3 (three) times daily.  Marland Kitchen glipiZIDE (GLUCOTROL XL) 5 MG 24 hr tablet Take 2 tablets (10 mg total) by mouth daily with breakfast.  . lisinopril (ZESTRIL) 5 MG tablet Take 1 tablet (5 mg total) by mouth daily.  . meloxicam (MOBIC) 7.5 MG tablet Take 7.5 mg by mouth in the morning and at bedtime.   . metFORMIN (GLUCOPHAGE) 500 MG tablet TAKE 1 TABLET BY MOUTH TWICE DAILY (Patient taking differently: Take 500 mg by mouth 2 (two) times daily with a meal.)  . metoCLOPramide (REGLAN) 5 MG tablet Take 5 mg by mouth 3 (three) times daily as needed for nausea or vomiting.   . metoprolol succinate (TOPROL-XL) 100 MG 24 hr tablet Take 100 mg by mouth daily. Take with or immediately following a meal.  . montelukast (SINGULAIR) 10 MG tablet Take 10 mg by mouth at bedtime.   Marland Kitchen morphine (MS CONTIN) 30 MG 12 hr tablet Take 1 tablet (30 mg total) by mouth  every 12 (twelve) hours.  . nitroGLYCERIN (NITROSTAT) 0.4 MG SL tablet Place 1 tablet (0.4 mg total) under the tongue every 5 (five) minutes as needed for chest pain (up to 3 doses. If taking 3rd dose call 911).  . ondansetron (ZOFRAN) 4 MG tablet Take 4 mg by mouth every other day.  . pantoprazole (PROTONIX) 40 MG tablet TAKE 1 TABLET BY MOUTH TWICE DAILY BEFORE MEALS (Patient taking differently: Take 40 mg by mouth 2 (two) times daily before a meal.)  . polyethylene glycol (MIRALAX / GLYCOLAX) 17 g packet Take 17 g by mouth daily as needed for moderate constipation.  . potassium chloride (K-DUR,KLOR-CON) 10 MEQ tablet Take 10 mEq by mouth 2 (two) times daily.  Marland Kitchen PROAIR HFA 108 (90 Base) MCG/ACT inhaler Inhale 2 puffs into the lungs every 4 (four) hours as needed for wheezing or shortness of breath.   . promethazine (PHENERGAN) 25 MG tablet Take 1 tablet (25 mg total) by mouth 4 (four) times daily as needed. (Patient taking differently: Take 25 mg by mouth 4 (four) times daily as needed for nausea or vomiting.)  . SURE COMFORT INS SYR 1CC/30G 30G X 5/16" 1 ML MISC 1 each by Other route daily.  Marland Kitchen topiramate (TOPAMAX) 200 MG tablet Take 200 mg by mouth at bedtime.  . [DISCONTINUED] NOVOLOG FLEXPEN 100 UNIT/ML FlexPen Inject 90 Units into the skin daily. (Patient not taking: Reported on 08/20/2020)   No facility-administered encounter medications on file as of 08/20/2020.   ALLERGIES: Allergies  Allergen Reactions  . Amphetamine-Dextroamphetamine Swelling  . Nitrofuran Derivatives Itching and Swelling  . Amphetamine-Dextroamphet Er Swelling  . Pregabalin Swelling  . Topiramate Other (See Comments)    Tongue tingle  . Verelan [Verapamil] Rash   VACCINATION STATUS:  There is no immunization history on file for this patient.  Diabetes She presents for her follow-up diabetic visit. She has type 2 diabetes mellitus. Onset time: She was diagnosed at approximate age of 49 years. Her disease course  has been worsening. There are no hypoglycemic associated symptoms. Pertinent negatives for hypoglycemia include no confusion, headaches, pallor or seizures. Pertinent negatives for diabetes include no chest pain, no fatigue, no polydipsia, no polyphagia and no polyuria.  There are no hypoglycemic complications. Symptoms are worsening. Diabetic complications include autonomic neuropathy, heart disease, peripheral neuropathy, PVD and retinopathy. Risk factors for coronary artery disease include dyslipidemia, diabetes mellitus, obesity, hypertension and sedentary lifestyle. Current diabetic treatments: Lantus 65 units daily at bedtime, Humalog 15 units 3 times a day before meals. She mentions allergy to Victoza, Byetta, and metformin. Her weight is increasing steadily. She is following a generally unhealthy diet. When asked about meal planning, she reported none. She has not had a previous visit with a dietitian. She never participates in exercise. Her home blood glucose trend is increasing steadily. Her breakfast blood glucose range is generally 140-180 mg/dl. Her lunch blood glucose range is generally >200 mg/dl. Her dinner blood glucose range is generally >200 mg/dl. Her bedtime blood glucose range is generally >200 mg/dl. Her overall blood glucose range is >200 mg/dl. (She presents with near target fasting glycemic profile, however significantly above target postprandial readings.  Her most recent A1c was 11.2%.  She did not document or report hypoglycemia.     ) An ACE inhibitor/angiotensin II receptor blocker is not being taken. Eye exam is current.  Hyperlipidemia This is a chronic problem. The current episode started more than 1 year ago. The problem is uncontrolled. Recent lipid tests were reviewed and are high. Exacerbating diseases include diabetes and obesity. Pertinent negatives include no chest pain, myalgias or shortness of breath. Current antihyperlipidemic treatment includes statins. Risk factors  for coronary artery disease include diabetes mellitus, dyslipidemia, hypertension, obesity and a sedentary lifestyle.  Hypertension This is a chronic problem. The current episode started more than 1 year ago. Pertinent negatives include no chest pain, headaches, palpitations or shortness of breath. Risk factors for coronary artery disease include diabetes mellitus, dyslipidemia, obesity and sedentary lifestyle. Hypertensive end-organ damage includes CAD/MI, PVD and retinopathy.    Review of systems: Limited as above.  Objective:    BP (!) 160/78   Pulse 84   Ht _0  (1.626 m)   Wt 264 lb (119.7 kg)   BMI 45.32 kg/m   Wt Readings from Last 3 Encounters:  08/20/20 264 lb (119.7 kg)  08/10/20 242 lb 3.2 oz (109.9 kg)  08/08/20 242 lb 8.1 oz (110 kg)     CMP     Component Value Date/Time   NA 136 08/08/2020 0244   NA 139 07/05/2020 1338   K 3.7 08/08/2020 0244   CL 99 08/08/2020 0244   CO2 28 08/08/2020 0244   GLUCOSE 233 (H) 08/08/2020 0244   BUN 10 08/08/2020 0244   BUN 16 07/05/2020 1338   CREATININE 0.67 08/08/2020 0244   CREATININE 0.70 11/02/2018 1105   CALCIUM 8.7 (L) 08/08/2020 0244   PROT 6.5 07/05/2020 1338   ALBUMIN 3.6 (L) 07/05/2020 1338   AST 14 07/05/2020 1338   ALT 17 07/05/2020 1338   ALKPHOS 111 07/05/2020 1338   BILITOT 0.4 07/05/2020 1338   GFRNONAA >60 08/08/2020 0244   GFRNONAA 91 11/02/2018 1105   GFRAA >60 10/27/2019 1130   GFRAA 105 11/02/2018 1105   Diabetic Labs (most recent): Lab Results  Component Value Date   HGBA1C 11.2 (H) 08/08/2020   HGBA1C 10.4 (A) 07/09/2020   HGBA1C 9.5 02/29/2020    Lipid Panel     Component Value Date/Time   CHOL 115 08/08/2020 0244   CHOL 127 07/05/2020 1338   TRIG 118 08/08/2020 0244   HDL 37 (L) 08/08/2020 0244   HDL 46 07/05/2020 1338   CHOLHDL 3.1  08/08/2020 0244   VLDL 24 08/08/2020 0244   LDLCALC 54 08/08/2020 0244   LDLCALC 43 07/05/2020 1338   LDLCALC 90 07/06/2018 0936     Assessment &  Plan:   1. Type 2 diabetes mellitus with vascular disease ([place]) - Patient has currently uncontrolled symptomatic type 2 DM since  70 years of age.  She presents with near target fasting glycemic profile, however significantly above target postprandial readings.  Her most recent A1c was 11.2%.  She did not document or report hypoglycemia.     Recent labs reviewed.  - Her diabetes is complicated by obesity/sedentary life, coronary artery disease status post stent placement and patient remains at a high risk for more acute and chronic complications of diabetes which include CAD, CVA, CKD, retinopathy, and neuropathy. These are all discussed in detail with the patient.  - I have counseled the patient on diet management and weight loss, by adopting a carbohydrate restricted/protein rich diet.  - she acknowledges that there is a room for improvement in her food and drink choices. - Suggestion is made for her to avoid simple carbohydrates  from her diet including Cakes, Sweet Desserts, Ice Cream, Soda (diet and regular), Sweet Tea, Candies, Chips, Cookies, Store Bought Juices, Alcohol in Excess of  1-2 drinks a day, Artificial Sweeteners,  Coffee Creamer, and "Sugar-free" Products, Lemonade. This will help patient to have more stable blood glucose profile and potentially avoid unintended weight gain.   - Patient is advised to stick to a routine mealtimes to eat 3 meals  a day and avoid unnecessary snacks ( to snack only to correct hypoglycemia).   - I have approached patient with the following individualized plan to manage diabetes and patient agrees:   -In light of her presentation with significantly above target postprandial glycemic profile, she will need prandial insulin.    -She is advised to increase Tresiba to 90 units nightly, discussed and initiated Fiasp 10 units 3 times daily AC for Premeal blood glucose readings between 90 and 150 mg per DL, plus correction dose of Fiasp for blood glucose  readings above 150 mg/day.    -The need for monitoring blood glucose strictly-before meals and at bedtime is discussed with patient.   -She is warned not to take insulin without proper monitoring of blood glucose.  -Her insurance did not provide coverage for CGM device.  -She continues to have normal renal function, will continue to benefit from low-dose metformin and glipizide.  She is advised to continue metformin 500 mg p.o. twice daily, glipizide 5 mg p.o. daily at breakfast.  -Patient is encouraged to call clinic for blood glucose levels less than 70 or above 200 mg /dl.  -She has intolerance to GLP-1 receptor agonists.  2) BP/HTN: -Her blood pressure is not controlled to target.  She has adequate medications, advised to continue including metoprolol XL 50 mg p.o. daily.  3) Lipids/HPL: She has uncontrolled hypertriglyceridemia , still high at 439  LDL better at 50, HDL at 77. She is advised to continue Lipitor 40 mg p.o. nightly.   She is educated on the fact that   better control of diabetes will help for triglycerides.   4) Chronic Care/Health Maintenance:  -Patient is on Statin medications and encouraged to continue to follow up with Ophthalmology, Podiatrist at least yearly or according to recommendations, and advised to   stay away from smoking. I have recommended yearly flu vaccine and pneumonia vaccination at least every 5 years;  and  sleep for at least 7 hours a day. -She will benefit from a set of diabetic shoes, paperwork filled for her.   - I advised patient to maintain close follow up with Glenda Chroman, MD for primary care needs.      I spent 30 minutes in the care of the patient today including review of labs from [facility], Lipids, Thyroid Function, Hematology (current and previous including abstractions from other facilities); face-to-face time discussing  her blood glucose readings/logs, discussing hypoglycemia and hyperglycemia episodes and symptoms, medications  doses, her options of short and long term treatment based on the latest standards of care / guidelines;  discussion about incorporating lifestyle medicine;  and documenting the encounter.    Please refer to Patient Instructions for Blood Glucose Monitoring and Insulin/Medications Dosing Guide"  in media tab for additional information. Please  also refer to " Patient Self Inventory" in the Media  tab for reviewed elements of pertinent patient history.  [name] participated in the discussions, expressed understanding, and voiced agreement with the above plans.  All questions were answered to her satisfaction. she is encouraged to contact clinic should she have any questions or concerns prior to her return visit.    Follow up plan: - Patient to go to emergency room for better evaluation of chest pain. Return in about 2 weeks (around 09/03/2020) for F/U with Meter and Logs Only - no Labs.  Glade Lloyd, MD Phone: [phone]  Fax: [phone]  This note was partially dictated with voice recognition software. Similar sounding words can be transcribed inadequately or may not  be corrected upon review.  08/20/2020, 2:57 PM

## 2020-08-20 NOTE — Patient Instructions (Signed)

## 2020-08-27 NOTE — Progress Notes (Signed)
Cardiology Office Note  Date: 08/28/2020   ID: [name], DOB 01/15/1951, MRN [id number]  PCP:  Glenda Chroman, MD  Cardiologist:  Carlyle Dolly, MD Electrophysiologist:  None   Chief Complaint: 3 week follow up.  History of Present Illness: [name] is a 70 y.o. female with a history of CAD/MI, COPD, DM 2, chest pain, chronic diastolic heart failure, OSA, atrial fibrillation, HTN,  Last seen by Dr. Harl Bowie on 08/07/2020.  She had been experiencing 3 to 4 days of chest pain described as pressure-like under left breast 7 out of 10 in severity.  Could occur at rest or with exertion.  Associated with shortness of breath, hot, sweaty, nausea.  Pain lasting between 15 and 20 minutes.  Had been increasing in frequency occurring a few times a day.  Was improved with sublingual nitroglycerin.  She was sent to [facility] emergency room for complete initial evaluation.  Anticipating transfer to [facility] and having cardiac catheterization the next day for unstable angina.  She was in agreement for catheterization.   She presented later to [facility].  Initial troponin was negative and EKG showed no acute changes.  Troponins remained negative x2 despite fairly persistent pain.  Echo showed normal EF, G1 DD, elevated LA pressure, normal RV, no valvular disease, normal IVC, no evidence of pericardial effusion.  Chest pain was felt to be musculoskeletal in etiology.  She was continuing aspirin, statin, and beta-blocker.  Blood pressure was normal during admission.Marland Kitchen  BNP was normal and she was euvolemic. HgbA1c was 11.2. She was planning to follow up with endocrinologist Dr [name].  She is here for 3-week follow-up.  She states she has had no further chest pain.  However she stated when the chest pain occurred prior to her presentation to [facility] sublingual nitroglycerin had resolved the pain.  She has not had a stress test since 2018.  She has multiple risk factors and pre-existing CAD.  She  denies any current anginal or exertional symptoms.  She is not very active on a daily basis.  She states she does have sleep apnea and currently does not use a CPAP machine.  She states during the [place] pandemic she was supposed to be evaluated further at [facility] but this apparently fell through at some point and she has had no further follow-up.  She does have some chronic dyspnea.  Her diabetes is not well controlled and she sees Dr. Dorris Hopkins.  He has issues with morbid obesity with a BMI of 41.  Past Medical History:  Diagnosis Date   Anxiety and depression    Arthritis    CAD (coronary artery disease)    Stent placement circumflex coronary 2007, catheterization 2008 patent stents. 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    High cholesterol    Hx of blood clots    Hypertension    MI (myocardial infarction) ([place])    2007   Obstructive sleep apnea    CPAP, setting ?2   Palpitations  Sleep apnea    Tachycardia     Past Surgical History:  Procedure Laterality Date   ABDOMINAL HYSTERECTOMY     Arm Surgery Bilateral    due to fall-pt broke both forearms   BIOPSY N/A 07/01/2013   Procedure: BIOPSY;  Surgeon: Rogene Houston, MD;  Location: [facility] ORS;  Service: Endoscopy;  Laterality: N/A;   CHOLECYSTECTOMY     COLONOSCOPY  08/06/2011   Procedure: COLONOSCOPY;  Surgeon: Rogene Houston, MD;  Location: [facility] END[facility];  Service: Endoscopy;  Laterality: N/A;  215   COLONOSCOPY WITH PROPOFOL N/A 12/31/2012   Procedure: COLONOSCOPY WITH PROPOFOL;  Surgeon: Rogene Houston, MD;  Location: [facility] ORS;  Service: Endoscopy;  Laterality: N/A;   in cecum at 0814, total withdrawal time 60mn   COLONOSCOPY WITH PROPOFOL N/A 02/05/2018   Procedure: COLONOSCOPY WITH PROPOFOL;  Surgeon: RRogene Houston MD;  Location: [facility];  Service: Endoscopy;  Laterality: N/A;  1:25   CORONARY ANGIOPLASTY WITH STENT PLACEMENT     CYSTOSCOPY W/ URETERAL STENT PLACEMENT Right 11/10/2019   at [facility] URETEROSCOPY AND STENT PLACEMENT Right 12/22/2019   Procedure: CYSTOSCOPY WITH RIGHT URETERAL STENT REMOVAL; RIGHT RETROGRADE PYELOGRAM,RIGHT URETEROSCOPY;  Surgeon: MCleon Gustin MD;  Location: [facility] OR[facility];  Service: Urology;  Laterality: Right;   ESOPHAGEAL DILATION N/A 06/07/2015   Procedure: ESOPHAGEAL DILATION;  Surgeon: NRogene Houston MD;  [facility];  Service: Endoscopy;  Laterality: N/A;   ESOPHAGOGASTRODUODENOSCOPY N/A 06/07/2015   Procedure: ESOPHAGOGASTRODUODENOSCOPY (EGD);  Surgeon: NRogene Houston MD;  [facility];  Service: Endoscopy;  Laterality: N/A;  2:45 - moved to 1:00 - Ann to notify pt   ESOPHAGOGASTRODUODENOSCOPY (EGD) WITH PROPOFOL N/A 12/31/2012   Procedure: ESOPHAGOGASTRODUODENOSCOPY (EGD) WITH PROPOFOL;  Surgeon: NRogene Houston MD;  Location: [facility];  Service: Endoscopy;  Laterality: N/A;  GE junction 37,   ESOPHAGOGASTRODUODENOSCOPY (EGD) WITH PROPOFOL N/A 07/01/2013   Procedure: ESOPHAGOGASTRODUODENOSCOPY (EGD) WITH PROPOFOL;  Surgeon: NRogene Houston MD;  Location: [facility];  Service: Endoscopy;  Laterality: N/A;  [place] 12/31/2012   Procedure: MALONEY DILATION;  Surgeon: NRogene Houston MD;  Location: [facility];  Service: Endoscopy;  Laterality: N/A;  used a # 54,#56   MALONEY DILATION N/A 07/01/2013   Procedure: MALONEY DILATION;  Surgeon: NRogene Houston MD;  Location: [facility];  Service: Endoscopy;  Laterality: N/A;  54/58; no heme present   POLYPECTOMY  02/05/2018   Procedure: POLYPECTOMY;  Surgeon: RRogene Houston MD;  [facility];  Service: Endoscopy;;  distal rectum (HS)   STONE EXTRACTION WITH BASKET Right 12/22/2019   Procedure: STONE EXTRACTION WITH BASKET;  Surgeon: MCleon Gustin MD;  Location: [facility];  Service: Urology;  Laterality: Right;    Current Outpatient Medications  Medication Sig Dispense Refill   ALPRAZolam (XANAX) 0.5 MG tablet Take 0.5 mg by mouth 2 (two) times daily as needed for anxiety or sleep.     Armodafinil 150 MG tablet Take 150 mg by mouth daily.     aspirin EC 81 MG tablet Take 1 tablet (81 mg total) by mouth daily.     atorvastatin (LIPITOR) 40 MG tablet Take 40 mg by mouth daily.     B-D ULTRAFINE III SHORT PEN 31G X 8 MM MISC Inject into the skin 4 (four) times daily.     blood glucose meter kit and supplies KIT 1  each by Does not apply route 2 (two) times daily. Dispense based on patient and insurance preference. Use bid as directed. E11.65 1 each 0   buPROPion (WELLBUTRIN SR) 100 MG 12 hr tablet Take 100 mg by mouth daily.     Cholecalciferol (VITAMIN D3) 125 MCG (5000 UT) CAPS TAKE ONE CAPSULE BY MOUTH DAILY (Patient taking differently: Take 5,000 Units by mouth daily.) 90 capsule 0   clotrimazole-betamethasone (LOTRISONE) cream Apply to affected area 2 times daily for 21 days (Patient taking differently: Apply 1 application topically 2 (two) times daily.) 15 g 1   diclofenac Sodium (VOLTAREN) 1 % GEL APPLY TO BOTH KNEES THREE TIMES DAILY (Patient taking differently: Apply 2 g topically in the morning, at noon, and at bedtime.) 300 g 2   diltiazem (CARDIZEM CD) 240 MG 24 hr capsule Take 240 mg by mouth daily.     doxepin (SINEQUAN) 25 MG capsule Take 75 mg by mouth at bedtime.      estradiol (ESTRACE) 0.5 MG tablet Take 0.5 mg by mouth daily.      FEROSUL 325 (65 Fe) MG tablet Take 325 mg by mouth every morning.     furosemide (LASIX) 40 MG tablet Take 1 tablet by mouth daily.     gabapentin (NEURONTIN) 400 MG capsule Take 400 mg by mouth 3 (three) times daily.  5    glipiZIDE (GLUCOTROL XL) 5 MG 24 hr tablet Take 2 tablets (10 mg total) by mouth daily with breakfast. 60 tablet 3   insulin aspart (FIASP FLEXTOUCH) 100 UNIT/ML FlexTouch Pen Inject 10-16 Units into the skin 3 (three) times daily before meals. 15 mL 2   insulin degludec (TRESIBA FLEXTOUCH) 200 UNIT/ML FlexTouch Pen Inject 90 Units into the skin at bedtime. 18 mL 2   lisinopril (ZESTRIL) 5 MG tablet Take 1 tablet (5 mg total) by mouth daily. 90 tablet 2   meloxicam (MOBIC) 7.5 MG tablet Take 7.5 mg by mouth in the morning and at bedtime.   4   metFORMIN (GLUCOPHAGE) 500 MG tablet TAKE 1 TABLET BY MOUTH TWICE DAILY (Patient taking differently: Take 500 mg by mouth 2 (two) times daily with a meal.) 60 tablet 3   metoCLOPramide (REGLAN) 5 MG tablet Take 5 mg by mouth 3 (three) times daily as needed for nausea or vomiting.   1   metoprolol succinate (TOPROL-XL) 100 MG 24 hr tablet Take 100 mg by mouth daily. Take with or immediately following a meal.     montelukast (SINGULAIR) 10 MG tablet Take 10 mg by mouth at bedtime.      morphine (MS CONTIN) 30 MG 12 hr tablet Take 1 tablet (30 mg total) by mouth every 12 (twelve) hours. 60 tablet 0   nitroGLYCERIN (NITROSTAT) 0.4 MG SL tablet Place 1 tablet (0.4 mg total) under the tongue every 5 (five) minutes as needed for chest pain (up to 3 doses. If taking 3rd dose call 911). 25 tablet 3   ondansetron (ZOFRAN) 4 MG tablet Take 4 mg by mouth every other day.     pantoprazole (PROTONIX) 40 MG tablet TAKE 1 TABLET BY MOUTH TWICE DAILY BEFORE MEALS (Patient taking differently: Take 40 mg by mouth 2 (two) times daily before a meal.) 60 tablet 1   polyethylene glycol (MIRALAX / GLYCOLAX) 17 g packet Take 17 g by mouth daily as needed for moderate constipation.     potassium chloride (K-DUR,KLOR-CON) 10 MEQ tablet Take 10 mEq by mouth 2 (two) times daily.  12   PROAIR HFA 108 (90 Base) MCG/ACT inhaler Inhale 2 puffs into the lungs every 4 (four) hours as needed for  wheezing or shortness of breath.   8   promethazine (PHENERGAN) 25 MG tablet Take 1 tablet (25 mg total) by mouth 4 (four) times daily as needed. (Patient taking differently: Take 25 mg by mouth 4 (four) times daily as needed for nausea or vomiting.) 30 tablet 0   SURE COMFORT INS SYR 1CC/30G 30G X 5/16" 1 ML MISC 1 each by Other route daily.     topiramate (TOPAMAX) 200 MG tablet Take 200 mg by mouth at bedtime.  3   No current facility-administered medications for this visit.   Allergies:  Amphetamine-dextroamphetamine, Nitrofuran derivatives, Amphetamine-dextroamphet er, Pregabalin, Topiramate, and Verelan [verapamil]   Social History: The patient  reports that she has never smoked. She has never used smokeless tobacco. She reports that she does not drink alcohol and does not use drugs.   Family History: The patient's family history includes Cancer in her brother, brother, maternal aunt, maternal uncle, paternal aunt, paternal uncle, and another family member; Colon cancer in her mother; Coronary artery disease in an other family member; Heart attack in her father and mother; Hemophilia in her child; Stroke in her sister.   ROS:  Please see the history of present illness. Otherwise, complete review of systems is positive for none.  All other systems are reviewed and negative.   Physical Exam: VS:  BP 132/70   Pulse 82   Ht 5' 4"  (1.626 m)   Wt 240 lb 12.8 oz (109.2 kg)   SpO2 93%   BMI 41.33 kg/m , BMI Body mass index is 41.33 kg/m.  Wt Readings from Last 3 Encounters:  08/28/20 240 lb 12.8 oz (109.2 kg)  08/20/20 264 lb (119.7 kg)  08/10/20 242 lb 3.2 oz (109.9 kg)    General: Morbidly obese patient appears comfortable at rest. Neck: Supple, no elevated JVP or carotid bruits, no thyromegaly. Lungs: Clear to auscultation, nonlabored breathing at rest. Cardiac: Regular rate and rhythm, no S3 or significant systolic murmur, no pericardial rub. Extremities: No pitting edema, distal  pulses 2+. Skin: Warm and dry. Musculoskeletal: No kyphosis. Neuropsychiatric: Alert and oriented x3, affect grossly appropriate.  ECG:  EKG 08/07/2020 heart rate of 91 normal sinus rhythm.  Recent Labwork: 07/05/2020: ALT 17; AST 14; Magnesium 1.7; TSH 2.910 08/08/2020: B Natriuretic Peptide 33.5; BUN 10; Creatinine, Ser 0.67; Hemoglobin 10.6; Platelets 382; Potassium 3.7; Sodium 136     Component Value Date/Time   CHOL 115 08/08/2020 0244   CHOL 127 07/05/2020 1338   TRIG 118 08/08/2020 0244   HDL 37 (L) 08/08/2020 0244   HDL 46 07/05/2020 1338   CHOLHDL 3.1 08/08/2020 0244   VLDL 24 08/08/2020 0244   LDLCALC 54 08/08/2020 0244   LDLCALC 43 07/05/2020 1338   LDLCALC 90 07/06/2018 0936    Other Studies Reviewed Today:  Echocardiogram 08/08/2020  1. Left ventricular ejection fraction, by estimation, is 55 to 60%. The left ventricle has normal function. The left ventricle has no regional wall motion abnormalities. There is mild concentric left ventricular hypertrophy. Left ventricular diastolic parameters are consistent with Grade I diastolic dysfunction (impaired relaxation). Elevated left atrial pressure. 2. Right ventricular systolic function is normal. The right ventricular size is normal. 3. The mitral valve is normal in structure. No evidence of mitral valve regurgitation. No evidence of mitral stenosis. 4. The aortic valve is normal in structure. Aortic  valve regurgitation is trivial. No aortic stenosis is present. 5. The inferior vena cava is normal in size with greater than 50% respiratory variability, suggesting right atrial pressure of 3 mmHg. Comparison(s): Prior images unable to be directly viewed, comparison made by report only    Echocardiogram 04/05/2019 [facility]):  Summary   1. The left ventricle is normal in size with mildly increased wall thickness.   2. The left ventricular systolic function is normal, LVEF is visually estimated at > 55%. Grade 2  diastolic dysfunction.   3. The left atrium is mildly dilated in size.   4. The right ventricle is mildly dilated in size, with normal systolic function.   5. The right atrium is mildly dilated  in size.     08/2016 nuclear stress There was no ST segment deviation noted during stress. The study is normal. This is a low risk study. Nuclear stress EF: 68%.     04/2020 30 day monitor - no arrhytnmias  Assessment and Plan:  1. CAD in native artery   2. Essential hypertension, benign   3. Non-cardiac chest pain   4. Uncontrolled type 2 diabetes mellitus with hyperglycemia ([place])    1. CAD in native artery chest pain History of CAD/MI with previous stents.  Recent presentation for chest pain and was ruled out for ACS at [facility].  She states nitroglycerin relieved her chest pain.  Please schedule follow-up Lexiscan Myoview to assess for ischemia.  Continue aspirin 81 mg daily.  Continue sublingual nitroglycerin as needed.  Continue Toprol-XL 100 mg daily.  Continue atorvastatin 40 mg p.o. daily.  2. Essential hypertension, benign Blood pressure is reasonably controlled today at 132/70.  Continue Toprol-XL 100 mg daily.  Continue Cardizem CD 240 mg daily.  Continue lisinopril 5 mg daily.  Continue Lasix 40 mg daily.  Continue potassium supplementation 10 mEq daily.  3. Non-cardiac chest pain Recent ED visit with diagnosis of chest wall pain.  4. Uncontrolled type 2 diabetes mellitus with hyperglycemia [facility]) Patient states her diabetes is a little better control.  She is seeing Dr. Dorris Hopkins endocrinology.  5.  Obstructive sleep apnea Patient has a history of obstructive sleep apnea.  She needs follow-up with pulmonology.  Please refer her to Dr. Elsworth Soho for her sleep apnea.  She may need a new CPAP machine and possible repeat sleep study  Medication Adjustments/Labs and Tests Ordered: Current medicines are reviewed at length with the patient today.  Concerns regarding medicines are  outlined above.   Disposition: Follow-up with Dr. Harl Bowie or APP 6 to 8 weeks  Signed, Levell July, NP 08/28/2020 1:50 PM    [facility] [place], [place], [place] [address] Phone: [phone]; Fax: [phone]

## 2020-08-28 ENCOUNTER — Ambulatory Visit (INDEPENDENT_AMBULATORY_CARE_PROVIDER_SITE_OTHER): Payer: Medicare Other | Admitting: Family Medicine

## 2020-08-28 ENCOUNTER — Encounter: Payer: Self-pay | Admitting: Family Medicine

## 2020-08-28 ENCOUNTER — Encounter: Payer: Self-pay | Admitting: *Deleted

## 2020-08-28 VITALS — BP 132/70 | HR 82 | Ht 64.0 in | Wt 240.8 lb

## 2020-08-28 DIAGNOSIS — I2510 Atherosclerotic heart disease of native coronary artery without angina pectoris: Secondary | ICD-10-CM | POA: Diagnosis not present

## 2020-08-28 DIAGNOSIS — I10 Essential (primary) hypertension: Secondary | ICD-10-CM

## 2020-08-28 DIAGNOSIS — R0789 Other chest pain: Secondary | ICD-10-CM

## 2020-08-28 DIAGNOSIS — R079 Chest pain, unspecified: Secondary | ICD-10-CM

## 2020-08-28 DIAGNOSIS — E1165 Type 2 diabetes mellitus with hyperglycemia: Secondary | ICD-10-CM

## 2020-08-28 DIAGNOSIS — G4733 Obstructive sleep apnea (adult) (pediatric): Secondary | ICD-10-CM

## 2020-08-28 NOTE — Patient Instructions (Addendum)
Medication Instructions:  Your physician recommends that you continue on your current medications as directed. Please refer to the Current Medication list given to you today.  Labwork: none  Testing/Procedures: Your physician has requested that you have a lexiscan myoview. For further information please visit [email or web address]. Please follow instruction sheet, as given.  Follow-Up: Your physician recommends that you schedule a follow-up appointment in: 6-8 weeks  Any Other Special Instructions Will Be Listed Below (If Applicable). You have been referred to [facility] Pulmonology  If you need a refill on your cardiac medications before your next appointment, please call your pharmacy.

## 2020-09-03 ENCOUNTER — Encounter: Payer: Self-pay | Admitting: "Endocrinology

## 2020-09-03 ENCOUNTER — Other Ambulatory Visit: Payer: Self-pay

## 2020-09-03 ENCOUNTER — Ambulatory Visit (INDEPENDENT_AMBULATORY_CARE_PROVIDER_SITE_OTHER): Payer: Medicare Other | Admitting: "Endocrinology

## 2020-09-03 VITALS — BP 146/84 | HR 88 | Ht 64.0 in | Wt 241.8 lb

## 2020-09-03 DIAGNOSIS — E559 Vitamin D deficiency, unspecified: Secondary | ICD-10-CM | POA: Diagnosis not present

## 2020-09-03 DIAGNOSIS — I200 Unstable angina: Secondary | ICD-10-CM

## 2020-09-03 DIAGNOSIS — E1159 Type 2 diabetes mellitus with other circulatory complications: Secondary | ICD-10-CM | POA: Diagnosis not present

## 2020-09-03 DIAGNOSIS — E782 Mixed hyperlipidemia: Secondary | ICD-10-CM | POA: Diagnosis not present

## 2020-09-03 DIAGNOSIS — I10 Essential (primary) hypertension: Secondary | ICD-10-CM | POA: Diagnosis not present

## 2020-09-03 MED ORDER — ACCU-CHEK GUIDE VI STRP: ORAL_STRIP | 2 refills | Status: DC

## 2020-09-03 MED ORDER — ACCU-CHEK GUIDE ME W/DEVICE KIT: 1.0000 | PACK | 0 refills | Status: DC

## 2020-09-03 NOTE — Progress Notes (Signed)
09/03/2020   Endocrinology follow-up note   Subjective:    Patient ID: [name], female    DOB: 01-03-1951. Patient is returning with incomplete glycemic profile, still significantly above target.  She has not used her rapid acting insulin correctly.  She is kept informed Premeal blood glucose readings between 90-150 mg/day.   -She is on follow-up for currently uncontrolled, complicated type 2 diabetes; hyperlipidemia; hypertension.  Past Medical History:  Diagnosis Date   Anxiety and depression    Arthritis    CAD (coronary artery disease)    Stent placement circumflex coronary 2007, catheterization 2008 patent stents. 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    High cholesterol    Hx of blood clots    Hypertension    MI (myocardial infarction) ([place])    2007   Obstructive sleep apnea    CPAP, setting ?2   Palpitations    Sleep apnea    Tachycardia    Past Surgical History:  Procedure Laterality Date   ABDOMINAL HYSTERECTOMY     Arm Surgery Bilateral    due to fall-pt broke both forearms   BIOPSY N/A 07/01/2013   Procedure: BIOPSY;  Surgeon: Rogene Houston, MD;  Location: [facility];  Service: Endoscopy;  Laterality: N/A;   CHOLECYSTECTOMY     COLONOSCOPY  08/06/2011   Procedure: COLONOSCOPY;  Surgeon: Rogene Houston, MD;  [facility];  Service: Endoscopy;  Laterality: N/A;  215   COLONOSCOPY WITH PROPOFOL N/A 12/31/2012   Procedure: COLONOSCOPY WITH PROPOFOL;  Surgeon: Rogene Houston, MD;  Location: [facility];  Service: Endoscopy;  Laterality: N/A;  in cecum at 0814, total withdrawal time 76mn    COLONOSCOPY WITH PROPOFOL N/A 02/05/2018   Procedure: COLONOSCOPY WITH PROPOFOL;  Surgeon: RRogene Houston MD;  [facility];  Service: Endoscopy;  Laterality: N/A;  1:25   CORONARY ANGIOPLASTY WITH STENT PLACEMENT     CYSTOSCOPY W/ URETERAL STENT PLACEMENT Right 11/10/2019   [facility] [place] PLACEMENT Right 12/22/2019   Procedure: CYSTOSCOPY WITH RIGHT URETERAL STENT REMOVAL; RIGHT RETROGRADE PYELOGRAM,RIGHT URETEROSCOPY;  Surgeon: MCleon Gustin MD;  Location: [facility];  Service: Urology;  Laterality: Right;   ESOPHAGEAL DILATION N/A 06/07/2015   Procedure: ESOPHAGEAL DILATION;  Surgeon: NRogene Houston MD;  [facility];  Service: Endoscopy;  Laterality: N/A;   ESOPHAGOGASTRODUODENOSCOPY N/A 06/07/2015   Procedure: ESOPHAGOGASTRODUODENOSCOPY (EGD);  Surgeon: NRogene Houston MD;  [facility];  Service: Endoscopy;  Laterality: N/A;  2:45 - moved to 1:00 - Ann to notify pt   ESOPHAGOGASTRODUODENOSCOPY (EGD) WITH PROPOFOL N/A 12/31/2012   Procedure: ESOPHAGOGASTRODUODENOSCOPY (EGD) WITH PROPOFOL;  Surgeon: NRogene Houston MD;  Location: [facility];  Service: Endoscopy;  Laterality: N/A;  GE junction 37,   ESOPHAGOGASTRODUODENOSCOPY (EGD) WITH PROPOFOL N/A 07/01/2013   Procedure: ESOPHAGOGASTRODUODENOSCOPY (EGD) WITH PROPOFOL;  Surgeon: NRogene Houston MD;  [facility]: [facility];  Service: Endoscopy;  Laterality: N/A;  950  MALONEY DILATION N/A 12/31/2012   Procedure: MALONEY DILATION;  Surgeon: Rogene Houston, MD;  Location: [facility];  Service: Endoscopy;  Laterality: N/A;  used a # 54,#56   MALONEY DILATION N/A 07/01/2013   Procedure: MALONEY DILATION;  Surgeon: Rogene Houston, MD;  Location: [facility];  Service: Endoscopy;  Laterality: N/A;  54/58; no heme present   POLYPECTOMY  02/05/2018   Procedure: POLYPECTOMY;  Surgeon: Rogene Houston, MD;  [facility];  Service: Endoscopy;;  distal rectum (HS)    STONE EXTRACTION WITH BASKET Right 12/22/2019   Procedure: STONE EXTRACTION WITH BASKET;  Surgeon: Cleon Gustin, MD;  Location: [facility];  Service: Urology;  Laterality: Right;   Social History   Socioeconomic History   Marital status: Divorced    Spouse name: Not on file   Number of children: 4   Years of education: Not on file   Highest education level: Not on file  Occupational History   Occupation: Disabled  Tobacco Use   Smoking status: Never   Smokeless tobacco: Never  Vaping Use   Vaping Use: Never used  Substance and Sexual Activity   Alcohol use: No    Alcohol/week: 0.0 standard drinks   Drug use: No   Sexual activity: Not on file  Other Topics Concern   Not on file  Social History Narrative   Patient does not get regular exercise   Denies caffeine use    Social Determinants of Health   Financial Resource Strain: Not on file  Food Insecurity: Not on file  Transportation Needs: Not on file  Physical Activity: Not on file  Stress: Not on file  Social Connections: Not on file   Outpatient Encounter Medications as of 09/03/2020  Medication Sig   Blood Glucose Monitoring Suppl (ACCU-CHEK GUIDE ME) w/Device KIT 1 Piece by Does not apply route as directed.   glucose blood (ACCU-CHEK GUIDE) test strip Use as instructed   ALPRAZolam (XANAX) 0.5 MG tablet Take 0.5 mg by mouth 2 (two) times daily as needed for anxiety or sleep.   Armodafinil 150 MG tablet Take 150 mg by mouth daily.   aspirin EC 81 MG tablet Take 1 tablet (81 mg total) by mouth daily.   atorvastatin (LIPITOR) 40 MG tablet Take 40 mg by mouth daily.   B-D ULTRAFINE III SHORT PEN 31G X 8 MM MISC Inject into the skin 4 (four) times daily.   blood glucose meter kit and supplies KIT 1 each by Does not apply route 2 (two) times daily. Dispense based on patient and insurance preference. Use bid as directed. E11.65   buPROPion (WELLBUTRIN SR) 100 MG 12 hr tablet Take 100 mg by mouth daily.    Cholecalciferol (VITAMIN D3) 125 MCG (5000 UT) CAPS TAKE ONE CAPSULE BY MOUTH DAILY (Patient taking differently: Take 5,000 Units by mouth daily.)   clotrimazole-betamethasone (LOTRISONE) cream Apply to affected area 2 times daily for 21 days (Patient taking differently: Apply 1 application topically 2 (two) times daily.)   diclofenac Sodium (VOLTAREN) 1 % GEL APPLY TO BOTH KNEES THREE TIMES DAILY (Patient taking differently: Apply 2 g topically in the morning, at noon, and at bedtime.)   diltiazem (CARDIZEM CD) 240 MG 24 hr capsule Take 240 mg by mouth daily.   doxepin (SINEQUAN) 25 MG capsule Take 75 mg by mouth at bedtime.    estradiol (ESTRACE) 0.5 MG tablet Take 0.5 mg by mouth daily.    FEROSUL 325 (65 Fe) MG tablet  Take 325 mg by mouth every morning.   furosemide (LASIX) 40 MG tablet Take 1 tablet by mouth daily.   gabapentin (NEURONTIN) 400 MG capsule Take 400 mg by mouth 3 (three) times daily.   glipiZIDE (GLUCOTROL XL) 5 MG 24 hr tablet Take 2 tablets (10 mg total) by mouth daily with breakfast.   insulin aspart (FIASP FLEXTOUCH) 100 UNIT/ML FlexTouch Pen Inject 10-16 Units into the skin 3 (three) times daily before meals.   insulin degludec (TRESIBA FLEXTOUCH) 200 UNIT/ML FlexTouch Pen Inject 90 Units into the skin at bedtime.   lisinopril (ZESTRIL) 5 MG tablet Take 1 tablet (5 mg total) by mouth daily.   meloxicam (MOBIC) 7.5 MG tablet Take 7.5 mg by mouth in the morning and at bedtime.    metFORMIN (GLUCOPHAGE) 500 MG tablet TAKE 1 TABLET BY MOUTH TWICE DAILY (Patient taking differently: Take 500 mg by mouth 2 (two) times daily with a meal.)   metoCLOPramide (REGLAN) 5 MG tablet Take 5 mg by mouth 3 (three) times daily as needed for nausea or vomiting.    metoprolol succinate (TOPROL-XL) 100 MG 24 hr tablet Take 100 mg by mouth daily. Take with or immediately following a meal.   montelukast (SINGULAIR) 10 MG tablet Take 10 mg by mouth at bedtime.    morphine (MS CONTIN) 30 MG 12 hr  tablet Take 1 tablet (30 mg total) by mouth every 12 (twelve) hours.   nitroGLYCERIN (NITROSTAT) 0.4 MG SL tablet Place 1 tablet (0.4 mg total) under the tongue every 5 (five) minutes as needed for chest pain (up to 3 doses. If taking 3rd dose call 911).   ondansetron (ZOFRAN) 4 MG tablet Take 4 mg by mouth every other day.   pantoprazole (PROTONIX) 40 MG tablet TAKE 1 TABLET BY MOUTH TWICE DAILY BEFORE MEALS (Patient taking differently: Take 40 mg by mouth 2 (two) times daily before a meal.)   polyethylene glycol (MIRALAX / GLYCOLAX) 17 g packet Take 17 g by mouth daily as needed for moderate constipation.   potassium chloride (K-DUR,KLOR-CON) 10 MEQ tablet Take 10 mEq by mouth 2 (two) times daily.   PROAIR HFA 108 (90 Base) MCG/ACT inhaler Inhale 2 puffs into the lungs every 4 (four) hours as needed for wheezing or shortness of breath.    promethazine (PHENERGAN) 25 MG tablet Take 1 tablet (25 mg total) by mouth 4 (four) times daily as needed. (Patient taking differently: Take 25 mg by mouth 4 (four) times daily as needed for nausea or vomiting.)   SURE COMFORT INS SYR 1CC/30G 30G X 5/16" 1 ML MISC 1 each by Other route daily.   topiramate (TOPAMAX) 200 MG tablet Take 200 mg by mouth at bedtime.   No facility-administered encounter medications on file as of 09/03/2020.   ALLERGIES: Allergies  Allergen Reactions   Amphetamine-Dextroamphetamine Swelling   Nitrofuran Derivatives Itching and Swelling   Amphetamine-Dextroamphet Er Swelling   Pregabalin Swelling   Topiramate Other (See Comments)    Tongue tingle   Verelan [Verapamil] Rash   VACCINATION STATUS:  There is no immunization history on file for this patient.  Diabetes She presents for her follow-up diabetic visit. She has type 2 diabetes mellitus. Onset time: She was diagnosed at approximate age of 38 years. Her disease course has been worsening. There are no hypoglycemic associated symptoms. Pertinent negatives for hypoglycemia  include no confusion, headaches, pallor or seizures. Pertinent negatives for diabetes include no chest pain, no fatigue, no polydipsia, no polyphagia and no  polyuria. There are no hypoglycemic complications. Symptoms are worsening. Diabetic complications include autonomic neuropathy, heart disease, peripheral neuropathy, PVD and retinopathy. Risk factors for coronary artery disease include dyslipidemia, diabetes mellitus, obesity, hypertension and sedentary lifestyle. Current diabetic treatments: Lantus 65 units daily at bedtime, Humalog 15 units 3 times a day before meals. She mentions allergy to Victoza, Byetta, and metformin. Her weight is increasing steadily. She is following a generally unhealthy diet. When asked about meal planning, she reported none. She has not had a previous visit with a dietitian. She never participates in exercise. Her home blood glucose trend is increasing steadily. Her breakfast blood glucose range is generally 140-180 mg/dl. Her lunch blood glucose range is generally >200 mg/dl. Her dinner blood glucose range is generally >200 mg/dl. Her bedtime blood glucose range is generally >200 mg/dl. Her overall blood glucose range is >200 mg/dl. (She presents with near target fasting glycemic profile, however significantly above target postprandial readings.  Her most recent A1c was 11.2%.  She did not document or report hypoglycemia.     ) An ACE inhibitor/angiotensin II receptor blocker is not being taken. Eye exam is current.  Hyperlipidemia This is a chronic problem. The current episode started more than 1 year ago. The problem is uncontrolled. Recent lipid tests were reviewed and are high. Exacerbating diseases include diabetes and obesity. Pertinent negatives include no chest pain, myalgias or shortness of breath. Current antihyperlipidemic treatment includes statins. Risk factors for coronary artery disease include diabetes mellitus, dyslipidemia, hypertension, obesity and a  sedentary lifestyle.  Hypertension This is a chronic problem. The current episode started more than 1 year ago. Pertinent negatives include no chest pain, headaches, palpitations or shortness of breath. Risk factors for coronary artery disease include diabetes mellitus, dyslipidemia, obesity and sedentary lifestyle. Hypertensive end-organ damage includes CAD/MI, PVD and retinopathy.   Review of systems: Limited as above.  Objective:    BP (!) 146/84   Pulse 88   Ht _0  (1.626 m)   Wt 241 lb 12.8 oz (109.7 kg)   BMI 41.50 kg/m   Wt Readings from Last 3 Encounters:  09/03/20 241 lb 12.8 oz (109.7 kg)  08/28/20 240 lb 12.8 oz (109.2 kg)  08/20/20 264 lb (119.7 kg)     CMP     Component Value Date/Time   NA 136 08/08/2020 0244   NA 139 07/05/2020 1338   K 3.7 08/08/2020 0244   CL 99 08/08/2020 0244   CO2 28 08/08/2020 0244   GLUCOSE 233 (H) 08/08/2020 0244   BUN 10 08/08/2020 0244   BUN 16 07/05/2020 1338   CREATININE 0.67 08/08/2020 0244   CREATININE 0.70 11/02/2018 1105   CALCIUM 8.7 (L) 08/08/2020 0244   PROT 6.5 07/05/2020 1338   ALBUMIN 3.6 (L) 07/05/2020 1338   AST 14 07/05/2020 1338   ALT 17 07/05/2020 1338   ALKPHOS 111 07/05/2020 1338   BILITOT 0.4 07/05/2020 1338   GFRNONAA >60 08/08/2020 0244   GFRNONAA 91 11/02/2018 1105   GFRAA >60 10/27/2019 1130   GFRAA 105 11/02/2018 1105   Diabetic Labs (most recent): Lab Results  Component Value Date   HGBA1C 11.2 (H) 08/08/2020   HGBA1C 10.4 (A) 07/09/2020   HGBA1C 9.5 02/29/2020    Lipid Panel     Component Value Date/Time   CHOL 115 08/08/2020 0244   CHOL 127 07/05/2020 1338   TRIG 118 08/08/2020 0244   HDL 37 (L) 08/08/2020 0244   HDL 46 07/05/2020 1338  CHOLHDL 3.1 08/08/2020 0244   VLDL 24 08/08/2020 0244   LDLCALC 54 08/08/2020 0244   LDLCALC 43 07/05/2020 1338   LDLCALC 90 07/06/2018 0936     Assessment & Plan:   1. Type 2 diabetes mellitus with vascular disease ([place]) - Patient has  currently uncontrolled symptomatic type 2 DM since  70 years of age.  She presents with significantly above target glycemic profile postprandially.  Her recent A1c was 11.2%.  She did not document any hypoglycemia.  She has not utilized her prandial insulin properly.      Recent labs reviewed.  - Her diabetes is complicated by obesity/sedentary life, coronary artery disease status post stent placement and patient remains at a high risk for more acute and chronic complications of diabetes which include CAD, CVA, CKD, retinopathy, and neuropathy. These are all discussed in detail with the patient.  - I have counseled the patient on diet management and weight loss, by adopting a carbohydrate restricted/protein rich diet. - she acknowledges that there is a room for improvement in her food and drink choices. - Suggestion is made for her to avoid simple carbohydrates  from her diet including Cakes, Sweet Desserts, Ice Cream, Soda (diet and regular), Sweet Tea, Candies, Chips, Cookies, Store Bought Juices, Alcohol in Excess of  1-2 drinks a day, Artificial Sweeteners,  Coffee Creamer, and "Sugar-free" Products, Lemonade. This will help patient to have more stable blood glucose profile and potentially avoid unintended weight gain.   - Patient is advised to stick to a routine mealtimes to eat 3 meals  a day and avoid unnecessary snacks ( to snack only to correct hypoglycemia).   - I have approached patient with the following individualized plan to manage diabetes and patient agrees:   -In light of her presentation with significantly above target postprandial glycemic profile, she will need prandial insulin.    -She is advised to continue Tresiba 90 units nightly, emphasized the need for prandial insulin.  She is advised to resume and continue Fiasp 10 units 3 times daily AC for Premeal blood glucose readings between 90 and 150 mg per DL, plus correction dose of Fiasp for blood glucose readings above 150  mg/day.    -The need for monitoring blood glucose strictly-before meals and at bedtime is discussed with patient.  I discussed and prescribed and new set of Accu-Chek testing supplies for her. -She is warned not to take insulin without proper monitoring of blood glucose.  -Her insurance did not provide coverage for CGM device.  -She continues to have normal renal function, will continue to benefit from low-dose metformin and glipizide.  She is advised to continue metformin 500 mg p.o. twice daily, glipizide 5 mg p.o. daily at breakfast.  -Patient is encouraged to call clinic for blood glucose levels less than 70 or above 200 mg /dl.  -She has intolerance to GLP-1 receptor agonists.  2) BP/HTN: -Her blood pressure is not controlled to target.  She has adequate medications, advised to continue including metoprolol XL 50 mg p.o. daily.  3) Lipids/HPL: She has uncontrolled hypertriglyceridemia , still high at 439  LDL better at 50, HDL at 77. She is advised to continue Lipitor 40 mg p.o. nightly.     She is educated on the fact that   better control of diabetes will help for triglycerides.   4) Chronic Care/Health Maintenance:  -Patient is on Statin medications and encouraged to continue to follow up with Ophthalmology, Podiatrist at least yearly or according  to recommendations, and advised to   stay away from smoking. I have recommended yearly flu vaccine and pneumonia vaccination at least every 5 years;  and  sleep for at least 7 hours a day. -She will benefit from a set of diabetic shoes, paperwork filled for her.   - I advised patient to maintain close follow up with Glenda Chroman, MD for primary care needs.     I spent 40 minutes in the care of the patient today including review of labs from [facility], Lipids, Thyroid Function, Hematology (current and previous including abstractions from other facilities); face-to-face time discussing  her blood glucose readings/logs, discussing  hypoglycemia and hyperglycemia episodes and symptoms, medications doses, her options of short and long term treatment based on the latest standards of care / guidelines;  discussion about incorporating lifestyle medicine;  and documenting the encounter.    Please refer to Patient Instructions for Blood Glucose Monitoring and Insulin/Medications Dosing Guide"  in media tab for additional information. Please  also refer to " Patient Self Inventory" in the Media  tab for reviewed elements of pertinent patient history.  [name] participated in the discussions, expressed understanding, and voiced agreement with the above plans.  All questions were answered to her satisfaction. she is encouraged to contact clinic should she have any questions or concerns prior to her return visit.   Follow up plan: - Patient to go to emergency room for better evaluation of chest pain. Return in about 10 weeks (around 11/12/2020) for Bring Meter and Logs- A1c in Office.  Glade Lloyd, MD Phone: [phone]  Fax: [phone]  This note was partially dictated with voice recognition software. Similar sounding words can be transcribed inadequately or may not  be corrected upon review.  09/03/2020, 2:58 PM

## 2020-09-03 NOTE — Patient Instructions (Signed)

## 2020-09-10 ENCOUNTER — Ambulatory Visit (HOSPITAL_COMMUNITY)
Admission: RE | Admit: 2020-09-10 | Discharge: 2020-09-10 | Disposition: A | Discharge status: Home / Self Care | Payer: Medicare Other | Source: Ambulatory Visit | Attending: Family Medicine | Admitting: Family Medicine

## 2020-09-10 ENCOUNTER — Other Ambulatory Visit: Payer: Self-pay

## 2020-09-10 DIAGNOSIS — R079 Chest pain, unspecified: Secondary | ICD-10-CM | POA: Insufficient documentation

## 2020-09-10 LAB — NM MYOCAR MULTI W/SPECT W/WALL MOTION / EF
LV dias vol: 59 mL (ref 46–106)
LV sys vol: 32 mL
Peak HR: 97 {beats}/min
RATE: 0.31
Rest HR: 80 {beats}/min
SDS: 4
SRS: 0
SSS: 4
TID: 0.9

## 2020-09-10 MED ORDER — TECHNETIUM TC 99M TETROFOSMIN IV KIT
10.0000 | PACK | Freq: Once | INTRAVENOUS | Status: AC | PRN
  Administered 2020-09-10: 9 via INTRAVENOUS

## 2020-09-10 MED ORDER — SODIUM CHLORIDE FLUSH 0.9 % IV SOLN
INTRAVENOUS | Status: AC
  Administered 2020-09-10: 10 mL via INTRAVENOUS
  Filled 2020-09-10: qty 10

## 2020-09-10 MED ORDER — REGADENOSON 0.4 MG/5ML IV SOLN
INTRAVENOUS | Status: AC
  Administered 2020-09-10: 0.4 mg via INTRAVENOUS
  Filled 2020-09-10: qty 5

## 2020-09-10 MED ORDER — TECHNETIUM TC 99M TETROFOSMIN IV KIT
30.0000 | PACK | Freq: Once | INTRAVENOUS | Status: AC | PRN
  Administered 2020-09-10: 27 via INTRAVENOUS

## 2020-09-11 ENCOUNTER — Encounter: Payer: Medicare Other | Attending: Registered Nurse | Admitting: Registered Nurse

## 2020-09-11 ENCOUNTER — Encounter: Payer: Self-pay | Admitting: Registered Nurse

## 2020-09-11 ENCOUNTER — Telehealth: Payer: Self-pay | Admitting: *Deleted

## 2020-09-11 VITALS — BP 150/86 | HR 82 | Temp 97.8°F | Ht 64.0 in | Wt 241.2 lb

## 2020-09-11 DIAGNOSIS — M542 Cervicalgia: Secondary | ICD-10-CM | POA: Diagnosis present

## 2020-09-11 DIAGNOSIS — M5412 Radiculopathy, cervical region: Secondary | ICD-10-CM | POA: Insufficient documentation

## 2020-09-11 DIAGNOSIS — M546 Pain in thoracic spine: Secondary | ICD-10-CM | POA: Diagnosis present

## 2020-09-11 DIAGNOSIS — G894 Chronic pain syndrome: Secondary | ICD-10-CM | POA: Insufficient documentation

## 2020-09-11 DIAGNOSIS — M7061 Trochanteric bursitis, right hip: Secondary | ICD-10-CM | POA: Insufficient documentation

## 2020-09-11 DIAGNOSIS — I200 Unstable angina: Secondary | ICD-10-CM

## 2020-09-11 DIAGNOSIS — M5416 Radiculopathy, lumbar region: Secondary | ICD-10-CM | POA: Diagnosis present

## 2020-09-11 DIAGNOSIS — M48062 Spinal stenosis, lumbar region with neurogenic claudication: Secondary | ICD-10-CM | POA: Diagnosis present

## 2020-09-11 DIAGNOSIS — M1711 Unilateral primary osteoarthritis, right knee: Secondary | ICD-10-CM

## 2020-09-11 DIAGNOSIS — Z79891 Long term (current) use of opiate analgesic: Secondary | ICD-10-CM

## 2020-09-11 DIAGNOSIS — E1140 Type 2 diabetes mellitus with diabetic neuropathy, unspecified: Secondary | ICD-10-CM | POA: Diagnosis present

## 2020-09-11 DIAGNOSIS — Z5181 Encounter for therapeutic drug level monitoring: Secondary | ICD-10-CM | POA: Insufficient documentation

## 2020-09-11 DIAGNOSIS — M7062 Trochanteric bursitis, left hip: Secondary | ICD-10-CM | POA: Diagnosis present

## 2020-09-11 DIAGNOSIS — M1712 Unilateral primary osteoarthritis, left knee: Secondary | ICD-10-CM | POA: Diagnosis present

## 2020-09-11 MED ORDER — MORPHINE SULFATE ER 30 MG PO TBCR: 30.0000 mg | EXTENDED_RELEASE_TABLET | Freq: Two times a day (BID) | ORAL | 0 refills | Status: DC

## 2020-09-11 NOTE — Telephone Encounter (Signed)
-----   Message from Verta Ellen., NP sent at 09/11/2020  7:56 AM EDT ----- Please call the patient and let her know there was no evidence of lack of blood flow through the coronary arteries in her heart on her recent stress test.  It was determined to be an intermediate risk test due to the calculated pumping function.  However on her echocardiogram her pumping function was normal.  Essentially a low risk stress test.  Thank you  Verta Ellen, NP  09/11/2020 7:54 AM

## 2020-09-11 NOTE — Telephone Encounter (Signed)
Patient informed. Copy sent to PCP °

## 2020-09-11 NOTE — Progress Notes (Signed)
Subjective:    Patient ID: [name], female    DOB: 01/30/1951, 70 y.o.   MRN: [id number]  HPI: [name] is a 70 y.o. female who returns for follow up appointment for chronic pain and medication refill. She states her pain is located in her neck radiating into her bilateral shoulders, mid- lower back pain radiating into her bilateral hips and bilateral lower extremities. She rates her pain 7.  Her current exercise regime is walking and performing stretching exercises.   [name] Morphine equivalent is 60.00  MME.   UDS ordered today.   Pain Inventory Average Pain 7 Pain Right Now 7 My pain is sharp, burning, stabbing, tingling, and aching  In the last 24 hours, has pain interfered with the following? General activity 3 Relation with others 3 Enjoyment of life 5 What TIME of day is your pain at its worst? morning  and night Sleep (in general) Fair  Pain is worse with: walking, bending, and standing Pain improves with: rest and medication Relief from Meds: 5  Family History  Problem Relation Age of Onset   Heart attack Mother    Colon cancer Mother    Heart attack Father    Stroke Sister    Hemophilia Child    Cancer Other    Coronary artery disease Other    Cancer Brother    Cancer Maternal Aunt    Cancer Maternal Uncle    Cancer Paternal Aunt    Cancer Paternal Uncle    Cancer Brother    Social History   Socioeconomic History   Marital status: Divorced    Spouse name: Not on file   Number of children: 4   Years of education: Not on file   Highest education level: Not on file  Occupational History   Occupation: Disabled  Tobacco Use   Smoking status: Never   Smokeless tobacco: Never  Vaping Use   Vaping Use: Never used  Substance and Sexual Activity   Alcohol use: No    Alcohol/week: 0.0 standard drinks   Drug use: No   Sexual activity: Not on file  Other Topics Concern   Not on file  Social History Narrative   Patient does not get  regular exercise   Denies caffeine use    Social Determinants of Health   Financial Resource Strain: Not on file  Food Insecurity: Not on file  Transportation Needs: Not on file  Physical Activity: Not on file  Stress: Not on file  Social Connections: Not on file   Past Surgical History:  Procedure Laterality Date   ABDOMINAL HYSTERECTOMY     Arm Surgery Bilateral    due to fall-pt broke both forearms   BIOPSY N/A 07/01/2013   Procedure: BIOPSY;  Surgeon: Rogene Houston, MD;  [facility]: [facility];  Service: Endoscopy;  Laterality: N/A;   CHOLECYSTECTOMY     COLONOSCOPY  08/06/2011   Procedure: COLONOSCOPY;  Surgeon: Rogene Houston, MD;  [facility];  Service: Endoscopy;  Laterality: N/A;  215   COLONOSCOPY WITH PROPOFOL N/A 12/31/2012   Procedure: COLONOSCOPY WITH PROPOFOL;  Surgeon: Rogene Houston, MD;  Location: [facility];  Service: Endoscopy;  Laterality: N/A;  in cecum at 0814, total withdrawal time 71mn   COLONOSCOPY WITH PROPOFOL N/A 02/05/2018   Procedure: COLONOSCOPY WITH PROPOFOL;  Surgeon: RRogene Houston MD;  [facility];  Service: Endoscopy;  Laterality: N/A;  1:25   CORONARY ANGIOPLASTY WITH STENT  PLACEMENT     CYSTOSCOPY W/ URETERAL STENT PLACEMENT Right 11/10/2019   at [place], URETEROSCOPY AND STENT PLACEMENT Right 12/22/2019   Procedure: CYSTOSCOPY WITH RIGHT URETERAL STENT REMOVAL; RIGHT RETROGRADE PYELOGRAM,RIGHT URETEROSCOPY;  Surgeon: Cleon Gustin, MD;  Location: [facility];  Service: Urology;  Laterality: Right;   ESOPHAGEAL DILATION N/A 06/07/2015   Procedure: ESOPHAGEAL DILATION;  Surgeon: Rogene Houston, MD;  [facility];  Service: Endoscopy;  Laterality: N/A;   ESOPHAGOGASTRODUODENOSCOPY N/A 06/07/2015   Procedure: ESOPHAGOGASTRODUODENOSCOPY (EGD);  Surgeon: Rogene Houston, MD;  [facility];  Service: Endoscopy;  Laterality: N/A;  2:45 - moved to 1:00 - Ann to notify pt    ESOPHAGOGASTRODUODENOSCOPY (EGD) WITH PROPOFOL N/A 12/31/2012   Procedure: ESOPHAGOGASTRODUODENOSCOPY (EGD) WITH PROPOFOL;  Surgeon: Rogene Houston, MD;  [facility];  Service: Endoscopy;  Laterality: N/A;  GE junction 37,   ESOPHAGOGASTRODUODENOSCOPY (EGD) WITH PROPOFOL N/A 07/01/2013   Procedure: ESOPHAGOGASTRODUODENOSCOPY (EGD) WITH PROPOFOL;  Surgeon: Rogene Houston, MD;  [facility];  Service: Endoscopy;  Laterality: N/A;  [place] N/A 12/31/2012   Procedure: MALONEY DILATION;  Surgeon: Rogene Houston, MD;  [facility];  Service: Endoscopy;  Laterality: N/A;  used a # 54,#56   MALONEY DILATION N/A 07/01/2013   Procedure: MALONEY DILATION;  Surgeon: Rogene Houston, MD;  [facility];  Service: Endoscopy;  Laterality: N/A;  54/58; no heme present   POLYPECTOMY  02/05/2018   Procedure: POLYPECTOMY;  Surgeon: Rogene Houston, MD;  [facility];  Service: Endoscopy;;  distal rectum (HS)   STONE EXTRACTION WITH BASKET Right 12/22/2019   Procedure: STONE EXTRACTION WITH BASKET;  Surgeon: Cleon Gustin, MD;  [facility];  Service: Urology;  Laterality: Right;   Past Surgical History:  Procedure Laterality Date   ABDOMINAL HYSTERECTOMY     Arm Surgery Bilateral    due to fall-pt broke both forearms   BIOPSY N/A 07/01/2013   Procedure: BIOPSY;  Surgeon: Rogene Houston, MD;  [facility];  Service: Endoscopy;  Laterality: N/A;   CHOLECYSTECTOMY     COLONOSCOPY  08/06/2011   Procedure: COLONOSCOPY;  Surgeon: Rogene Houston, MD;  [facility];  Service: Endoscopy;  Laterality: N/A;  215   COLONOSCOPY WITH PROPOFOL N/A 12/31/2012   Procedure: COLONOSCOPY WITH PROPOFOL;  Surgeon: Rogene Houston, MD;  [facility];  Service: Endoscopy;  Laterality: N/A;  in cecum at 0814, total withdrawal time 3mn   COLONOSCOPY WITH PROPOFOL N/A 02/05/2018   Procedure: COLONOSCOPY WITH PROPOFOL;  Surgeon: RRogene Houston MD;  [facility];  Service: Endoscopy;  Laterality: N/A;  1:25   CORONARY ANGIOPLASTY WITH STENT PLACEMENT     CYSTOSCOPY W/ URETERAL STENT PLACEMENT Right 11/10/2019   [place] AND STENT PLACEMENT Right 12/22/2019   Procedure: CYSTOSCOPY WITH RIGHT URETERAL STENT REMOVAL; RIGHT RETROGRADE PYELOGRAM,RIGHT URETEROSCOPY;  Surgeon: MCleon Gustin MD;  [facility];  Service: Urology;  Laterality: Right;   ESOPHAGEAL DILATION N/A 06/07/2015   Procedure: ESOPHAGEAL DILATION;  Surgeon: NRogene Houston MD;  [facility];  Service: Endoscopy;  Laterality: N/A;   ESOPHAGOGASTRODUODENOSCOPY N/A 06/07/2015   Procedure: ESOPHAGOGASTRODUODENOSCOPY (EGD);  Surgeon: NRogene Houston MD;  [facility];  Service: Endoscopy;  Laterality: N/A;  2:45 - moved to 1:00 - Ann to notify  pt   ESOPHAGOGASTRODUODENOSCOPY (EGD) WITH PROPOFOL N/A 12/31/2012   Procedure: ESOPHAGOGASTRODUODENOSCOPY (EGD) WITH PROPOFOL;  Surgeon: Rogene Houston, MD;  [facility];  Service: Endoscopy;  Laterality: N/A;  GE junction 37,   ESOPHAGOGASTRODUODENOSCOPY (EGD) WITH PROPOFOL N/A 07/01/2013   Procedure: ESOPHAGOGASTRODUODENOSCOPY (EGD) WITH PROPOFOL;  Surgeon: Rogene Houston, MD;  [facility];  Service: Endoscopy;  Laterality: N/A;  [place] N/A 12/31/2012   Procedure: MALONEY DILATION;  Surgeon: Rogene Houston, MD;  [facility];  Service: Endoscopy;  Laterality: N/A;  used a # 54,#56   MALONEY DILATION N/A 07/01/2013   Procedure: MALONEY DILATION;  Surgeon: Rogene Houston, MD;  [facility];  Service: Endoscopy;  Laterality: N/A;  54/58; no heme present   POLYPECTOMY  02/05/2018   Procedure: POLYPECTOMY;  Surgeon: Rogene Houston, MD;  [facility];  Service: Endoscopy;;  distal rectum (HS)   STONE EXTRACTION WITH BASKET Right 12/22/2019   Procedure: STONE EXTRACTION WITH BASKET;  Surgeon: Cleon Gustin, MD;   [facility];  Service: Urology;  Laterality: Right;   Past Medical History:  Diagnosis Date   Anxiety and depression    Arthritis    CAD (coronary artery disease)    Stent placement circumflex coronary 2007, catheterization 2008 patent stents. 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    High cholesterol    Hx of blood clots    Hypertension    MI (myocardial infarction) ([place])    2007   Obstructive sleep apnea    CPAP, setting ?2   Palpitations    Sleep apnea    Tachycardia    BP (!) 150/86 (BP Location: Right Arm, Patient Position: Sitting, Cuff Size: Large)   Pulse 82   Temp 97.8 F (36.6 C) (Oral)   Ht 5' 4"  (1.626 m)   Wt 241 lb 3.2 oz (109.4 kg)   SpO2 93%   BMI 41.40 kg/m   Opioid Risk Score:   Fall Risk Score:  `1  Depression screen PHQ 2/9  Depression screen [facility] 2/9 01/24/2020 11/18/2019 10/26/2019 09/26/2019 03/24/2019 07/16/2018 06/12/2017  Decreased Interest 0 1 0 0 1 1 3   Down, Depressed, Hopeless 0 1 0 0 1 1 3   PHQ - 2 Score 0 2 0 0 2 2 6   Altered sleeping - - - - - - 3  Tired, decreased energy - - - - - - 3  Change in appetite - - - - - - 0  Feeling bad or failure about yourself  - - - - - - 3  Trouble concentrating - - - - - - 3  Moving slowly or fidgety/restless - - - - - - 1  Suicidal thoughts - - - - - - 0  PHQ-9 Score - - - - - - 19  Difficult doing work/chores - - - - - - Very difficult  Some recent data might be hidden     Review of Systems  Constitutional: Negative.   HENT:  Negative.    Eyes: Negative.   Respiratory: Negative.    Cardiovascular: Negative.   Gastrointestinal:  Negative.   Endocrine: Negative.   Genitourinary: Negative.   Musculoskeletal:  Positive for back pain and gait problem.       Lower back  Skin: Negative.   Allergic/Immunologic: Negative.   Hematological: Negative.   Psychiatric/Behavioral: Negative.        Objective:   Physical Exam Vitals and nursing note reviewed.  Constitutional:      Appearance: Normal appearance.  Cardiovascular:     Rate and Rhythm: Normal rate and regular rhythm.     Pulses: Normal pulses.     Heart sounds: Normal heart sounds.  Pulmonary:     Effort: Pulmonary effort is normal.     Breath sounds: Normal breath sounds.  Musculoskeletal:     Cervical back: Normal range of motion and neck supple.     Comments: Normal Muscle Bulk and Muscle Testing Reveals:  Upper Extremities: Full ROM and Muscle Strength 5/5 Bilateral AC Joint Tenderness Thoracic and Lumbar Hypersensitivity Bilateral Greater Trochanter Tenderness Lower Extremities: Full ROM and Muscle Strength 5/5 Arises from Table Slowly using walker Antalgic  Gait     Skin:    General: Skin is warm and dry.  Neurological:     Mental Status: She is alert and oriented to person, place, and time.  Psychiatric:        Mood and Affect: Mood normal.        Behavior: Behavior normal.         Assessment & Plan:  1.Lumbar pain lumbar spondylosis:  Encouraged to continue to increase Activity as tolerated. 09/11/2020 Continue current medication regimen.  Refilled: MS Contin 30 MG one tablet every 12 hours #60. Boyfriend Joe is dispensing Ms. Karan Medication she reports. We will continue the opioid monitoring program, this consists of regular clinic visits, examinations, urine drug screen, pill counts as well as use of [place] Controlled Substance Reporting system. A 12 month History has been reviewed on the [place] on 09/11/2020.  2. Lumbar Radiculitis: Continue current medication regimen with  Gabapentin. 09/11/2020 3. Bilateral Knee Pain/ Degenerative: Continue Voltaren Gel. Continue to Monitor. 09/11/2020 4. Severe diabetic poly neuropathy: Continue current medication regimen with Gabapentin. 09/11/2020 5. Insomnia: Continue Armodafinil Psychiatry Following. 09/11/2020 6.Anxiety/Depression: Continue Current Medication regimen as prescribed by PCP and   Psychiatry. 09/11/2020 7. De Quervains Tenosynovitis: No Complaints Today. 09/11/2020 8. Bilateral  Greater Trochanteric Bursitis: Continue with Ice and Heat Therapy. Continue to Monitor. 09/11/2020.  9. Cervicalgia/ Cervical Radiculitis:Continue Gabapentin. Continue with HEP as Tolerated and continue to monitor. 09/11/2020 10. Chronic Bilateral Thoracic Back Pain: Continue HEP as Tolerated. Continue current medication regimen. Continue to monitor. 09/11/2020.   F/U in 1 month

## 2020-09-18 ENCOUNTER — Telehealth: Payer: Self-pay

## 2020-09-18 MED ORDER — ACCU-CHEK GUIDE ME W/DEVICE KIT: 1.0000 | PACK | 0 refills | Status: DC

## 2020-09-18 NOTE — Telephone Encounter (Signed)
Patient left a VM stating she needs her Blood Glucose Monitoring Suppl (ACCU-CHEK GUIDE ME) w/Device KIT sent to [facility]

## 2020-09-18 NOTE — Telephone Encounter (Signed)
Rx sent 

## 2020-09-19 LAB — TOXASSURE SELECT,+ANTIDEPR,UR

## 2020-09-21 ENCOUNTER — Telehealth: Payer: Self-pay | Admitting: *Deleted

## 2020-09-21 NOTE — Telephone Encounter (Signed)
Urine drug screen for this encounter is consistent for prescribed medication 

## 2020-10-08 NOTE — Progress Notes (Deleted)
Cardiology Office Note  Date: 10/08/2020   ID: Amen, Dargis Jun 01, 1950, MRN [id number]  PCP:  Glenda Chroman, MD  Cardiologist:  Carlyle Dolly, MD Electrophysiologist:  None   Chief Complaint: Wants to discuss pulmonary referral  History of Present Illness: [name] is a 70 y.o. female with a history of CAD/MI, COPD, DM 2, chest pain, chronic diastolic heart failure, OSA, atrial fibrillation, HTN,  Last seen by Dr. Harl Bowie on 08/07/2020.  She had been experiencing 3 to 4 days of chest pain described as pressure-like under left breast 7 out of 10 in severity.  Could occur at rest or with exertion.  Associated with shortness of breath, hot, sweaty, nausea.  Pain lasting between 15 and 20 minutes.  Had been increasing in frequency occurring a few times a day.  Was improved with sublingual nitroglycerin.  She was sent to [facility] emergency room for complete initial evaluation.  Anticipating transfer to [facility] and having cardiac catheterization the next day for unstable angina.  She was in agreement for catheterization.   She presented later to [facility].  Initial troponin was negative and EKG showed no acute changes.  Troponins remained negative x2 despite fairly persistent pain.  Echo showed normal EF, G1 DD, elevated LA pressure, normal RV, no valvular disease, normal IVC, no evidence of pericardial effusion.  Chest pain was felt to be musculoskeletal in etiology.  She was continuing aspirin, statin, and beta-blocker.  Blood pressure was normal during admission.Marland Kitchen  BNP was normal and she was euvolemic. HgbA1c was 11.2. She was planning to follow up with endocrinologist Dr Dorris Fetch.  She is here for 3-week follow-up.  She states she has had no further chest pain.  However she stated when the chest pain occurred prior to her presentation to [facility] sublingual nitroglycerin had resolved the pain.  She has not had a stress test since 2018.  She has multiple risk factors and  pre-existing CAD.  She denies any current anginal or exertional symptoms.  She is not very active on a daily basis.  She states she does have sleep apnea and currently does not use a CPAP machine.  She states during the [place] pandemic she was supposed to be evaluated further at [facility] but this apparently fell through at some point and she has had no further follow-up.  She does have some chronic dyspnea.  Her diabetes is not well controlled and she sees Dr. Dorris Fetch.  He has issues with morbid obesity with a BMI of 41.   Wants to discuss pulmonary referral  Past Medical History:  Diagnosis Date   Anxiety and depression    Arthritis    CAD (coronary artery disease)    Stent placement circumflex coronary 2007, catheterization 2008 patent stents. 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    High cholesterol    Hx of blood clots    Hypertension    MI (myocardial infarction) ([place])    2007   Obstructive sleep apnea  CPAP, setting ?2   Palpitations    Sleep apnea    Tachycardia     Past Surgical History:  Procedure Laterality Date   ABDOMINAL HYSTERECTOMY     Arm Surgery Bilateral    due to fall-pt broke both forearms   BIOPSY N/A 07/01/2013   Procedure: BIOPSY;  Surgeon: Rogene Houston, MD;  Location: [facility];  Service: Endoscopy;  Laterality: N/A;   CHOLECYSTECTOMY     COLONOSCOPY  08/06/2011   Procedure: COLONOSCOPY;  Surgeon: Rogene Houston, MD;  Location: [facility];  Service: Endoscopy;  Laterality: N/A;  215   COLONOSCOPY WITH PROPOFOL N/A 12/31/2012   Procedure: COLONOSCOPY WITH PROPOFOL;  Surgeon: Rogene Houston, MD;  Location: [facility];  Service: Endoscopy;  Laterality: N/A;  in cecum at 0814, total withdrawal time 36mn   COLONOSCOPY WITH PROPOFOL N/A 02/05/2018   Procedure: COLONOSCOPY WITH PROPOFOL;  Surgeon: RRogene Houston MD;  Location: [facility];  Service: Endoscopy;  Laterality: N/A;  1:25   CORONARY ANGIOPLASTY WITH STENT PLACEMENT     CYSTOSCOPY W/ URETERAL STENT PLACEMENT Right 11/10/2019   [place]AC[place] Right 12/22/2019   Procedure: CYSTOSCOPY WITH RIGHT URETERAL STENT REMOVAL; RIGHT RETROGRADE PYELOGRAM,RIGHT URETEROSCOPY;  Surgeon: MCleon Gustin MD;  Location: [facility];  Service: Urology;  Laterality: Right;   ESOPHAGEAL DILATION N/A 06/07/2015   Procedure: ESOPHAGEAL DILATION;  Surgeon: NRogene Houston MD;  [facility];  Service: Endoscopy;  Laterality: N/A;   ESOPHAGOGASTRODUODENOSCOPY N/A 06/07/2015   Procedure: ESOPHAGOGASTRODUODENOSCOPY (EGD);  Surgeon: NRogene Houston MD;  [facility];  Service: Endoscopy;  Laterality: N/A;  2:45 - moved to 1:00 - Ann to notify pt   ESOPHAGOGASTRODUODENOSCOPY (EGD) WITH PROPOFOL N/A 12/31/2012   Procedure: ESOPHAGOGASTRODUODENOSCOPY (EGD) WITH PROPOFOL;  Surgeon: NRogene Houston MD;  Location: [facility];  Service: Endoscopy;  Laterality: N/A;  GE junction 37,   ESOPHAGOGASTRODUODENOSCOPY (EGD) WITH PROPOFOL N/A 07/01/2013   Procedure: ESOPHAGOGASTRODUODENOSCOPY (EGD) WITH PROPOFOL;  Surgeon: NRogene Houston MD;  Location: [facility];  Service: Endoscopy;  Laterality: N/A;  [id number] 12/31/2012   Procedure: MALONEY DILATION;  Surgeon: NRogene Houston MD;  Location: [facility];  Service: Endoscopy;  Laterality: N/A;  used a # 54,#56   MALONEY DILATION N/A 07/01/2013   Procedure: MALONEY DILATION;  Surgeon: NRogene Houston MD;  Location: [facility];  Service: Endoscopy;  Laterality: N/A;  54/58; no heme present   POLYPECTOMY  02/05/2018    Procedure: POLYPECTOMY;  Surgeon: RRogene Houston MD;  [facility];  Service: Endoscopy;;  distal rectum (HS)   STONE EXTRACTION WITH BASKET Right 12/22/2019   Procedure: STONE EXTRACTION WITH BASKET;  Surgeon: MCleon Gustin MD;  Location: [facility];  Service: Urology;  Laterality: Right;    Current Outpatient Medications  Medication Sig Dispense Refill   ALPRAZolam (XANAX) 0.5 MG tablet Take 0.5 mg by mouth 2 (two) times daily as needed for anxiety or sleep.     amoxicillin (AMOXIL) 500 MG capsule Take 500 mg by mouth every 8 (eight) hours.     Armodafinil 150 MG tablet Take 150 mg by mouth daily.     aspirin EC 81 MG tablet Take 1 tablet (81 mg total) by mouth daily.     atorvastatin (LIPITOR) 40 MG tablet Take 40 mg by mouth daily.     B-D ULTRAFINE III  SHORT PEN 31G X 8 MM MISC Inject into the skin 4 (four) times daily.     blood glucose meter kit and supplies KIT 1 each by Does not apply route 2 (two) times daily. Dispense based on patient and insurance preference. Use bid as directed. E11.65 1 each 0   Blood Glucose Monitoring Suppl (ACCU-CHEK GUIDE ME) w/Device KIT 1 Piece by Does not apply route as directed. Test BG qid. E11.65 1 kit 0   buPROPion (WELLBUTRIN SR) 100 MG 12 hr tablet Take 100 mg by mouth daily.     Cholecalciferol (VITAMIN D3) 125 MCG (5000 UT) CAPS TAKE ONE CAPSULE BY MOUTH DAILY (Patient taking differently: Take 5,000 Units by mouth daily.) 90 capsule 0   clotrimazole-betamethasone (LOTRISONE) cream Apply to affected area 2 times daily for 21 days (Patient taking differently: Apply 1 application topically 2 (two) times daily.) 15 g 1   diclofenac Sodium (VOLTAREN) 1 % GEL APPLY TO BOTH KNEES THREE TIMES DAILY (Patient taking differently: Apply 2 g topically in the morning, at noon, and at bedtime.) 300 g 2   diltiazem (CARDIZEM CD) 240 MG 24 hr capsule Take 240 mg by mouth daily.     doxepin (SINEQUAN) 25 MG capsule Take 75 mg by mouth at bedtime.       estradiol (ESTRACE) 0.5 MG tablet Take 0.5 mg by mouth daily.      FEROSUL 325 (65 Fe) MG tablet Take 325 mg by mouth every morning.     furosemide (LASIX) 40 MG tablet Take 1 tablet by mouth daily.     gabapentin (NEURONTIN) 400 MG capsule Take 400 mg by mouth 3 (three) times daily.  5   glipiZIDE (GLUCOTROL XL) 5 MG 24 hr tablet Take 2 tablets (10 mg total) by mouth daily with breakfast. 60 tablet 3   glucose blood (ACCU-CHEK GUIDE) test strip Use as instructed 150 each 2   insulin aspart (FIASP FLEXTOUCH) 100 UNIT/ML FlexTouch Pen Inject 10-16 Units into the skin 3 (three) times daily before meals. 15 mL 2   insulin degludec (TRESIBA FLEXTOUCH) 200 UNIT/ML FlexTouch Pen Inject 90 Units into the skin at bedtime. 18 mL 2   lisinopril (ZESTRIL) 5 MG tablet Take 1 tablet (5 mg total) by mouth daily. 90 tablet 2   meloxicam (MOBIC) 7.5 MG tablet Take 7.5 mg by mouth in the morning and at bedtime.   4   metFORMIN (GLUCOPHAGE) 500 MG tablet TAKE 1 TABLET BY MOUTH TWICE DAILY (Patient taking differently: Take 500 mg by mouth 2 (two) times daily with a meal.) 60 tablet 3   metoCLOPramide (REGLAN) 5 MG tablet Take 5 mg by mouth 3 (three) times daily as needed for nausea or vomiting.   1   metoprolol succinate (TOPROL-XL) 100 MG 24 hr tablet Take 100 mg by mouth daily. Take with or immediately following a meal.     montelukast (SINGULAIR) 10 MG tablet Take 10 mg by mouth at bedtime.      morphine (MS CONTIN) 30 MG 12 hr tablet Take 1 tablet (30 mg total) by mouth every 12 (twelve) hours. 60 tablet 0   nitroGLYCERIN (NITROSTAT) 0.4 MG SL tablet Place 1 tablet (0.4 mg total) under the tongue every 5 (five) minutes as needed for chest pain (up to 3 doses. If taking 3rd dose call 911). 25 tablet 3   ondansetron (ZOFRAN) 4 MG tablet Take 4 mg by mouth every other day.     pantoprazole (PROTONIX) 40 MG tablet  TAKE 1 TABLET BY MOUTH TWICE DAILY BEFORE MEALS (Patient taking differently: Take 40 mg by mouth 2 (two)  times daily before a meal.) 60 tablet 1   polyethylene glycol (MIRALAX / GLYCOLAX) 17 g packet Take 17 g by mouth daily as needed for moderate constipation.     potassium chloride (K-DUR,KLOR-CON) 10 MEQ tablet Take 10 mEq by mouth 2 (two) times daily.  12   PROAIR HFA 108 (90 Base) MCG/ACT inhaler Inhale 2 puffs into the lungs every 4 (four) hours as needed for wheezing or shortness of breath.   8   promethazine (PHENERGAN) 25 MG tablet Take 1 tablet (25 mg total) by mouth 4 (four) times daily as needed. (Patient taking differently: Take 25 mg by mouth 4 (four) times daily as needed for nausea or vomiting.) 30 tablet 0   SURE COMFORT INS SYR 1CC/30G 30G X 5/16" 1 ML MISC 1 each by Other route daily.     topiramate (TOPAMAX) 200 MG tablet Take 200 mg by mouth at bedtime.  3   No current facility-administered medications for this visit.   Allergies:  Amphetamine-dextroamphetamine, Nitrofuran derivatives, Amphetamine-dextroamphet er, Pregabalin, Topiramate, and Verelan [verapamil]   Social History: The patient  reports that she has never smoked. She has never used smokeless tobacco. She reports that she does not drink alcohol and does not use drugs.   Family History: The patient's family history includes Cancer in her brother, brother, maternal aunt, maternal uncle, paternal aunt, paternal uncle, and another family member; Colon cancer in her mother; Coronary artery disease in an other family member; Heart attack in her father and mother; Hemophilia in her child; Stroke in her sister.   ROS:  Please see the history of present illness. Otherwise, complete review of systems is positive for none.  All other systems are reviewed and negative.   Physical Exam: VS:  There were no vitals taken for this visit., BMI There is no height or weight on file to calculate BMI.  Wt Readings from Last 3 Encounters:  09/11/20 241 lb 3.2 oz (109.4 kg)  09/03/20 241 lb 12.8 oz (109.7 kg)  08/28/20 240 lb 12.8 oz  (109.2 kg)    General: Morbidly obese patient appears comfortable at rest. Neck: Supple, no elevated JVP or carotid bruits, no thyromegaly. Lungs: Clear to auscultation, nonlabored breathing at rest. Cardiac: Regular rate and rhythm, no S3 or significant systolic murmur, no pericardial rub. Extremities: No pitting edema, distal pulses 2+. Skin: Warm and dry. Musculoskeletal: No kyphosis. Neuropsychiatric: Alert and oriented x3, affect grossly appropriate.  ECG:  EKG 08/07/2020 heart rate of 91 normal sinus rhythm.  Recent Labwork: 07/05/2020: ALT 17; AST 14; Magnesium 1.7; TSH 2.910 08/08/2020: B Natriuretic Peptide 33.5; BUN 10; Creatinine, Ser 0.67; Hemoglobin 10.6; Platelets 382; Potassium 3.7; Sodium 136     Component Value Date/Time   CHOL 115 08/08/2020 0244   CHOL 127 07/05/2020 1338   TRIG 118 08/08/2020 0244   HDL 37 (L) 08/08/2020 0244   HDL 46 07/05/2020 1338   CHOLHDL 3.1 08/08/2020 0244   VLDL 24 08/08/2020 0244   LDLCALC 54 08/08/2020 0244   LDLCALC 43 07/05/2020 1338   LDLCALC 90 07/06/2018 0936    Other Studies Reviewed Today:  Echocardiogram 08/08/2020  1. Left ventricular ejection fraction, by estimation, is 55 to 60%. The left ventricle has normal function. The left ventricle has no regional wall motion abnormalities. There is mild concentric left ventricular hypertrophy. Left ventricular diastolic parameters are consistent with  Grade I diastolic dysfunction (impaired relaxation). Elevated left atrial pressure. 2. Right ventricular systolic function is normal. The right ventricular size is normal. 3. The mitral valve is normal in structure. No evidence of mitral valve regurgitation. No evidence of mitral stenosis. 4. The aortic valve is normal in structure. Aortic valve regurgitation is trivial. No aortic stenosis is present. 5. The inferior vena cava is normal in size with greater than 50% respiratory variability, suggesting right atrial pressure of 3  mmHg. Comparison(s): Prior images unable to be directly viewed, comparison made by report only    Echocardiogram 04/05/2019 [facility]):  Summary   1. The left ventricle is normal in size with mildly increased wall thickness.   2. The left ventricular systolic function is normal, LVEF is visually estimated at > 55%. Grade 2 diastolic dysfunction.   3. The left atrium is mildly dilated in size.   4. The right ventricle is mildly dilated in size, with normal systolic function.   5. The right atrium is mildly dilated  in size.     08/2016 nuclear stress There was no ST segment deviation noted during stress. The study is normal. This is a low risk study. Nuclear stress EF: 68%.     04/2020 30 day monitor - no arrhytnmias  Assessment and Plan:  1. CAD in native artery   2. Essential hypertension, benign   3. Non-cardiac chest pain   4. Uncontrolled type 2 diabetes mellitus with hyperglycemia ([place])   5. Obstructive sleep apnea     1. CAD in native artery chest pain History of CAD/MI with previous stents.  Recent presentation for chest pain and was ruled out for ACS at [facility].  She states nitroglycerin relieved her chest pain.  Please schedule follow-up Lexiscan Myoview to assess for ischemia.  Continue aspirin 81 mg daily.  Continue sublingual nitroglycerin as needed.  Continue Toprol-XL 100 mg daily.  Continue atorvastatin 40 mg p.o. daily.  2. Essential hypertension, benign Blood pressure is reasonably controlled today at 132/70.  Continue Toprol-XL 100 mg daily.  Continue Cardizem CD 240 mg daily.  Continue lisinopril 5 mg daily.  Continue Lasix 40 mg daily.  Continue potassium supplementation 10 mEq daily.  3. Non-cardiac chest pain Recent ED visit with diagnosis of chest wall pain.  4. Uncontrolled type 2 diabetes mellitus with hyperglycemia [facility]) Patient states her diabetes is a little better controlled.  She is seeing Dr. Dorris Fetch endocrinology.  5.   Obstructive sleep apnea Patient has a history of obstructive sleep apnea.  She needs follow-up with pulmonology.  Please refer her to Dr. Elsworth Soho for her sleep apnea.  She may need a new CPAP machine and possible repeat sleep study  Medication Adjustments/Labs and Tests Ordered: Current medicines are reviewed at length with the patient today.  Concerns regarding medicines are outlined above.   Disposition: Follow-up with Dr. Harl Bowie or APP 6 to 8 weeks  Signed, Levell July, NP 10/08/2020 1:31 PM    [facility] [place], [place], [place] [address] Phone: [phone]; Fax: [phone]

## 2020-10-09 ENCOUNTER — Ambulatory Visit: Payer: Medicare Other | Admitting: Family Medicine

## 2020-10-09 DIAGNOSIS — I2510 Atherosclerotic heart disease of native coronary artery without angina pectoris: Secondary | ICD-10-CM

## 2020-10-09 DIAGNOSIS — E1165 Type 2 diabetes mellitus with hyperglycemia: Secondary | ICD-10-CM

## 2020-10-09 DIAGNOSIS — R0789 Other chest pain: Secondary | ICD-10-CM

## 2020-10-09 DIAGNOSIS — I10 Essential (primary) hypertension: Secondary | ICD-10-CM

## 2020-10-09 DIAGNOSIS — G4733 Obstructive sleep apnea (adult) (pediatric): Secondary | ICD-10-CM

## 2020-10-10 ENCOUNTER — Other Ambulatory Visit: Payer: Self-pay | Admitting: "Endocrinology

## 2020-10-11 ENCOUNTER — Encounter: Payer: Self-pay | Admitting: Registered Nurse

## 2020-10-11 ENCOUNTER — Other Ambulatory Visit: Payer: Self-pay

## 2020-10-11 ENCOUNTER — Encounter: Payer: Medicare Other | Attending: Registered Nurse | Admitting: Registered Nurse

## 2020-10-11 VITALS — BP 161/96 | HR 86 | Ht 64.0 in | Wt 241.0 lb

## 2020-10-11 DIAGNOSIS — M7062 Trochanteric bursitis, left hip: Secondary | ICD-10-CM | POA: Insufficient documentation

## 2020-10-11 DIAGNOSIS — M546 Pain in thoracic spine: Secondary | ICD-10-CM | POA: Diagnosis not present

## 2020-10-11 DIAGNOSIS — M542 Cervicalgia: Secondary | ICD-10-CM

## 2020-10-11 DIAGNOSIS — M1712 Unilateral primary osteoarthritis, left knee: Secondary | ICD-10-CM

## 2020-10-11 DIAGNOSIS — Z79891 Long term (current) use of opiate analgesic: Secondary | ICD-10-CM

## 2020-10-11 DIAGNOSIS — M7061 Trochanteric bursitis, right hip: Secondary | ICD-10-CM

## 2020-10-11 DIAGNOSIS — M5412 Radiculopathy, cervical region: Secondary | ICD-10-CM | POA: Diagnosis not present

## 2020-10-11 DIAGNOSIS — M5416 Radiculopathy, lumbar region: Secondary | ICD-10-CM

## 2020-10-11 DIAGNOSIS — Z5181 Encounter for therapeutic drug level monitoring: Secondary | ICD-10-CM

## 2020-10-11 DIAGNOSIS — G894 Chronic pain syndrome: Secondary | ICD-10-CM

## 2020-10-11 DIAGNOSIS — M48062 Spinal stenosis, lumbar region with neurogenic claudication: Secondary | ICD-10-CM | POA: Diagnosis not present

## 2020-10-11 DIAGNOSIS — M1711 Unilateral primary osteoarthritis, right knee: Secondary | ICD-10-CM

## 2020-10-11 DIAGNOSIS — I200 Unstable angina: Secondary | ICD-10-CM

## 2020-10-11 MED ORDER — MORPHINE SULFATE ER 30 MG PO TBCR: 30.0000 mg | EXTENDED_RELEASE_TABLET | Freq: Two times a day (BID) | ORAL | 0 refills | Status: DC

## 2020-10-11 NOTE — Progress Notes (Signed)
Subjective:    Patient ID: [name], female    DOB: 09-05-1950, 70 y.o.   MRN: [id number]  HPI: [name] is a 70 y.o. female whose appointment was changed to a My-Chart Video Visit, [name] called the office yesterday reporting she had nausea,diarrhea and not feeling well. She was instructed to call her PCP, she verbalizes understanding. [name] agrees with  My-Chart Video visit and verbalizes understanding. She  states her pain is located in her neck radiating into her bilateral shoulders, lower back pain radiating into her bilateral hips and bilateral lower extremities. She also reports bilateral knee and bilateral feet pain. She rates her pain 7. Her current exercise regime is walking in her home with her walker she states.    [name] Morphine equivalent is 60.00 MME. She is also prescribed Alprazolam by Dr. Woody Seller .We have discussed the black box warning of using opioids and benzodiazepines. I highlighted the dangers of using these drugs together and discussed the adverse events including respiratory suppression, overdose, cognitive impairment and importance of compliance with current regimen. We will continue to monitor and adjust as indicated.     Last UDS was {erformed on 09/11/2020, it was consistent.    Pain Inventory Average Pain 8 Pain Right Now 7 My pain is constant, sharp, burning, stabbing, tingling, and aching  In the last 24 hours, has pain interfered with the following? General activity 10 Relation with others 5 Enjoyment of life 9 What TIME of day is your pain at its worst? morning  and night Sleep (in general) Poor  Pain is worse with: walking, sitting, and inactivity Pain improves with: rest, medication, and heat Relief from Meds: 8  Family History  Problem Relation Age of Onset   Heart attack Mother    Colon cancer Mother    Heart attack Father    Stroke Sister    Hemophilia Child    Cancer Other    Coronary artery disease Other     Cancer Brother    Cancer Maternal Aunt    Cancer Maternal Uncle    Cancer Paternal Aunt    Cancer Paternal Uncle    Cancer Brother    Social History   Socioeconomic History   Marital status: Divorced    Spouse name: Not on file   Number of children: 4   Years of education: Not on file   Highest education level: Not on file  Occupational History   Occupation: Disabled  Tobacco Use   Smoking status: Never   Smokeless tobacco: Never  Vaping Use   Vaping Use: Never used  Substance and Sexual Activity   Alcohol use: No    Alcohol/week: 0.0 standard drinks   Drug use: No   Sexual activity: Not on file  Other Topics Concern   Not on file  Social History Narrative   Patient does not get regular exercise   Denies caffeine use    Social Determinants of Health   Financial Resource Strain: Not on file  Food Insecurity: Not on file  Transportation Needs: Not on file  Physical Activity: Not on file  Stress: Not on file  Social Connections: Not on file   Past Surgical History:  Procedure Laterality Date   ABDOMINAL HYSTERECTOMY     Arm Surgery Bilateral    due to fall-pt broke both forearms   BIOPSY N/A 07/01/2013   Procedure: BIOPSY;  Surgeon: Rogene Houston, MD;  [facility];  Service: Endoscopy;  Laterality: N/A;   CHOLECYSTECTOMY     COLONOSCOPY  08/06/2011   Procedure: COLONOSCOPY;  Surgeon: Rogene Houston, MD;  [facility];  Service: Endoscopy;  Laterality: N/A;  215   COLONOSCOPY WITH PROPOFOL N/A 12/31/2012   Procedure: COLONOSCOPY WITH PROPOFOL;  Surgeon: Rogene Houston, MD;  [facility]: [facility];  Service: Endoscopy;  Laterality: N/A;  in cecum at 0814, total withdrawal time 19mn   COLONOSCOPY WITH PROPOFOL N/A 02/05/2018   Procedure: COLONOSCOPY WITH PROPOFOL;  Surgeon: RRogene Houston MD;  [facility];  Service: Endoscopy;  Laterality: N/A;  1:25   CORONARY ANGIOPLASTY WITH STENT PLACEMENT     CYSTOSCOPY W/ URETERAL STENT PLACEMENT  Right 11/10/2019   [place]RE[place] AND STENT PLACEMENT Right 12/22/2019   Procedure: CYSTOSCOPY WITH RIGHT URETERAL STENT REMOVAL; RIGHT RETROGRADE PYELOGRAM,RIGHT URETEROSCOPY;  Surgeon: MCleon Gustin MD;  [facility];  Service: Urology;  Laterality: Right;   ESOPHAGEAL DILATION N/A 06/07/2015   Procedure: ESOPHAGEAL DILATION;  Surgeon: NRogene Houston MD;  [facility];  Service: Endoscopy;  Laterality: N/A;   ESOPHAGOGASTRODUODENOSCOPY N/A 06/07/2015   Procedure: ESOPHAGOGASTRODUODENOSCOPY (EGD);  Surgeon: NRogene Houston MD;  [facility];  Service: Endoscopy;  Laterality: N/A;  2:45 - moved to 1:00 - Ann to notify pt   ESOPHAGOGASTRODUODENOSCOPY (EGD) WITH PROPOFOL N/A 12/31/2012   Procedure: ESOPHAGOGASTRODUODENOSCOPY (EGD) WITH PROPOFOL;  Surgeon: NRogene Houston MD;  [facility];  Service: Endoscopy;  Laterality: N/A;  GE junction 37,   ESOPHAGOGASTRODUODENOSCOPY (EGD) WITH PROPOFOL N/A 07/01/2013   Procedure: ESOPHAGOGASTRODUODENOSCOPY (EGD) WITH PROPOFOL;  Surgeon: NRogene Houston MD;  [facility];  Service: Endoscopy;  Laterality: N/A;  [place]/A 12/31/2012   Procedure: MALONEY DILATION;  Surgeon: NRogene Houston MD;  [facility];  Service: Endoscopy;  Laterality: N/A;  used a # 54,#56   MALONEY DILATION N/A 07/01/2013   Procedure: MALONEY DILATION;  Surgeon: NRogene Houston MD;  [facility];  Service: Endoscopy;  Laterality: N/A;  54/58; no heme present   POLYPECTOMY  02/05/2018   Procedure: POLYPECTOMY;  Surgeon: RRogene Houston MD;  [facility];  Service: Endoscopy;;  distal rectum (HS)   STONE EXTRACTION WITH BASKET Right 12/22/2019   Procedure: STONE EXTRACTION WITH BASKET;  Surgeon: MCleon Gustin MD;  [facility];  Service: Urology;  Laterality: Right;   Past Surgical History:  Procedure Laterality Date   ABDOMINAL HYSTERECTOMY     Arm  Surgery Bilateral    due to fall-pt broke both forearms   BIOPSY N/A 07/01/2013   Procedure: BIOPSY;  Surgeon: NRogene Houston MD;  [facility];  Service: Endoscopy;  Laterality: N/A;   CHOLECYSTECTOMY     COLONOSCOPY  08/06/2011   Procedure: COLONOSCOPY;  Surgeon: NRogene Houston MD;  [facility];  Service: Endoscopy;  Laterality: N/A;  215   COLONOSCOPY WITH PROPOFOL N/A 12/31/2012   Procedure: COLONOSCOPY WITH PROPOFOL;  Surgeon: NRogene Houston MD;  [facility];  Service: Endoscopy;  Laterality: N/A;  in cecum at 0814, total withdrawal time 132m   COLONOSCOPY WITH PROPOFOL N/A 02/05/2018   Procedure: COLONOSCOPY WITH PROPOFOL;  Surgeon: ReRogene HoustonMD;  [facility];  Service: Endoscopy;  Laterality: N/A;  1:25   CORONARY ANGIOPLASTY WITH STENT PLACEMENT     CYSTOSCOPY W/ URETERAL STENT PLACEMENT Right  11/10/2019   at [place], URETEROSCOPY AND STENT PLACEMENT Right 12/22/2019   Procedure: CYSTOSCOPY WITH RIGHT URETERAL STENT REMOVAL; RIGHT RETROGRADE PYELOGRAM,RIGHT URETEROSCOPY;  Surgeon: Cleon Gustin, MD;  [facility];  Service: Urology;  Laterality: Right;   ESOPHAGEAL DILATION N/A 06/07/2015   Procedure: ESOPHAGEAL DILATION;  Surgeon: Rogene Houston, MD;  [facility];  Service: Endoscopy;  Laterality: N/A;   ESOPHAGOGASTRODUODENOSCOPY N/A 06/07/2015   Procedure: ESOPHAGOGASTRODUODENOSCOPY (EGD);  Surgeon: Rogene Houston, MD;  [facility];  Service: Endoscopy;  Laterality: N/A;  2:45 - moved to 1:00 - Ann to notify pt   ESOPHAGOGASTRODUODENOSCOPY (EGD) WITH PROPOFOL N/A 12/31/2012   Procedure: ESOPHAGOGASTRODUODENOSCOPY (EGD) WITH PROPOFOL;  Surgeon: Rogene Houston, MD;  [facility];  Service: Endoscopy;  Laterality: N/A;  GE junction 37,   ESOPHAGOGASTRODUODENOSCOPY (EGD) WITH PROPOFOL N/A 07/01/2013   Procedure: ESOPHAGOGASTRODUODENOSCOPY (EGD) WITH PROPOFOL;  Surgeon:  Rogene Houston, MD;  [facility];  Service: Endoscopy;  Laterality: N/A;  [place] N/A 12/31/2012   Procedure: MALONEY DILATION;  Surgeon: Rogene Houston, MD;  [facility];  Service: Endoscopy;  Laterality: N/A;  used a # 54,#56   MALONEY DILATION N/A 07/01/2013   Procedure: MALONEY DILATION;  Surgeon: Rogene Houston, MD;  [facility];  Service: Endoscopy;  Laterality: N/A;  54/58; no heme present   POLYPECTOMY  02/05/2018   Procedure: POLYPECTOMY;  Surgeon: Rogene Houston, MD;  [facility];  Service: Endoscopy;;  distal rectum (HS)   STONE EXTRACTION WITH BASKET Right 12/22/2019   Procedure: STONE EXTRACTION WITH BASKET;  Surgeon: Cleon Gustin, MD;  [facility];  Service: Urology;  Laterality: Right;   Past Medical History:  Diagnosis Date   Anxiety and depression    Arthritis    CAD (coronary artery disease)    Stent placement circumflex coronary 2007, catheterization 2008 patent stents. 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    High cholesterol    Hx of blood clots    Hypertension    MI (myocardial infarction) ([place])    2007   Obstructive sleep apnea    CPAP, setting ?2   Palpitations    Sleep apnea    Tachycardia    BP (!) 161/96   Pulse 86   Ht 5' 4"  (1.626 m)   Wt 241 lb (109.3 kg)   BMI 41.37 kg/m   Opioid Risk Score:   Fall Risk Score:  `1  Depression screen PHQ 2/9  Depression screen [facility] 2/9 10/11/2020 01/24/2020 11/18/2019 10/26/2019 09/26/2019 03/24/2019 07/16/2018  Decreased Interest 1 0 1 0 0 1 1  Down, Depressed, Hopeless - 0 1 0 0 1 1  PHQ - 2 Score  1 0 2 0 0 2 2  Altered sleeping - - - - - - -  Tired, decreased energy - - - - - - -  Change in appetite - - - - - - -  Feeling bad or failure about yourself  - - - - - - -  Trouble concentrating - - - - - - -  Moving slowly or fidgety/restless - - - - - - -  Suicidal thoughts - - - - - - -  PHQ-9 Score - - - - - - -  Difficult doing work/chores - - - - - - -  Some recent data might be hidden    Review of Systems  Musculoskeletal:  Positive for back pain, gait problem and neck pain.       Pain in both shoulders & feet  All other systems reviewed and are negative.     Objective:   Physical Exam Vitals and nursing note reviewed.  Musculoskeletal:     Comments: No Physical Exam Performed: My-Chart Video Visit          Assessment & Plan:  1.Lumbar pain lumbar spondylosis:  Encouraged to continue to increase Activity as tolerated. 10/11/2020 Continue current medication regimen.  Refilled: MS Contin 30 MG one tablet every 12 hours #60. Boyfriend Joe is dispensing Ms. Birchler Medication she reports. We will continue the opioid monitoring program, this consists of regular clinic visits, examinations, urine drug screen, pill counts as well as use of [place] Controlled Substance Reporting system. A 12 month History has been reviewed on the [place] on 10/11/2020.  2. Lumbar Radiculitis: Continue current medication regimen with Gabapentin. 10/11/2020 3. Bilateral Knee Pain/ Degenerative: Continue Voltaren Gel. Continue to Monitor. 10/11/2020 4. Severe diabetic poly neuropathy: Continue current medication regimen with Gabapentin. 10/11/2020 5. Insomnia: Continue Armodafinil Psychiatry Following. 10/11/2020 6.Anxiety/Depression: Continue Current Medication regimen as prescribed by PCP and   Psychiatry. 10/11/2020 7. De Quervains Tenosynovitis: No Complaints Today. 10/11/2020 8. Bilateral  Greater Trochanteric Bursitis: Continue with Ice  and Heat Therapy. Continue to Monitor. 10/11/2020.  9. Cervicalgia/ Cervical Radiculitis:Continue Gabapentin. Continue with HEP as Tolerated and continue to monitor. 10/11/2020 10. Chronic Bilateral Thoracic Back Pain: Continue HEP as Tolerated. Continue current medication regimen. Continue to monitor. 10/11/2020.   F/U in 1 month  My- Chart Video Visit Established Patient Location of Patient: In Her Home Location of Provider: In the Office

## 2020-10-12 ENCOUNTER — Telehealth: Payer: Self-pay

## 2020-10-12 NOTE — Telephone Encounter (Signed)
Error

## 2020-10-25 ENCOUNTER — Ambulatory Visit: Payer: Medicare Other | Admitting: "Endocrinology

## 2020-11-13 ENCOUNTER — Encounter: Payer: Medicare Other | Attending: Registered Nurse | Admitting: Registered Nurse

## 2020-11-13 ENCOUNTER — Encounter: Payer: Self-pay | Admitting: Registered Nurse

## 2020-11-13 ENCOUNTER — Other Ambulatory Visit: Payer: Self-pay

## 2020-11-13 VITALS — BP 120/77 | HR 85 | Temp 99.0°F | Ht 64.0 in | Wt 240.0 lb

## 2020-11-13 DIAGNOSIS — G894 Chronic pain syndrome: Secondary | ICD-10-CM | POA: Diagnosis present

## 2020-11-13 DIAGNOSIS — M5416 Radiculopathy, lumbar region: Secondary | ICD-10-CM

## 2020-11-13 DIAGNOSIS — M5412 Radiculopathy, cervical region: Secondary | ICD-10-CM | POA: Diagnosis present

## 2020-11-13 DIAGNOSIS — M546 Pain in thoracic spine: Secondary | ICD-10-CM | POA: Diagnosis present

## 2020-11-13 DIAGNOSIS — Z5181 Encounter for therapeutic drug level monitoring: Secondary | ICD-10-CM

## 2020-11-13 DIAGNOSIS — M1712 Unilateral primary osteoarthritis, left knee: Secondary | ICD-10-CM

## 2020-11-13 DIAGNOSIS — M1711 Unilateral primary osteoarthritis, right knee: Secondary | ICD-10-CM | POA: Diagnosis present

## 2020-11-13 DIAGNOSIS — M7061 Trochanteric bursitis, right hip: Secondary | ICD-10-CM

## 2020-11-13 DIAGNOSIS — M7062 Trochanteric bursitis, left hip: Secondary | ICD-10-CM

## 2020-11-13 DIAGNOSIS — M48062 Spinal stenosis, lumbar region with neurogenic claudication: Secondary | ICD-10-CM | POA: Diagnosis present

## 2020-11-13 DIAGNOSIS — M542 Cervicalgia: Secondary | ICD-10-CM | POA: Diagnosis present

## 2020-11-13 DIAGNOSIS — I200 Unstable angina: Secondary | ICD-10-CM

## 2020-11-13 DIAGNOSIS — Z79891 Long term (current) use of opiate analgesic: Secondary | ICD-10-CM | POA: Diagnosis present

## 2020-11-13 MED ORDER — MORPHINE SULFATE ER 30 MG PO TBCR: 30.0000 mg | EXTENDED_RELEASE_TABLET | Freq: Two times a day (BID) | ORAL | 0 refills | Status: DC

## 2020-11-13 NOTE — Progress Notes (Signed)
Subjective:    Patient ID: [name], female    DOB: 03-Nov-1950, 70 y.o.   MRN: [id number]  HPI: [name] is a 70 y.o. female who returns for follow up appointment for chronic pain and medication refill. She states her pain is located in her neck radiating into her bilateral shoulders, mid- lower back pain radiaiting into her bilateral hips and bilateral lower extremities. Also reports bilateral knee pain.. She rates her pain 7. Her current exercise regime is walking and performing stretching exercises.   [name] Morphine equivalent is 60.00 MME. She  is also prescribed Alprazolam  by Dr. Woody Seller .We have discussed the black box warning of using opioids and benzodiazepines. I highlighted the dangers of using these drugs together and discussed the adverse events including respiratory suppression, overdose, cognitive impairment and importance of compliance with current regimen. We will continue to monitor and adjust as indicated.     Last UDS was Performed on 09/11/2020, it was consistent.    Pain Inventory Average Pain 7 Pain Right Now 7 My pain is sharp, burning, stabbing, tingling, and aching  In the last 24 hours, has pain interfered with the following? General activity 7 Relation with others 7 Enjoyment of life 7 What TIME of day is your pain at its worst? morning  and night Sleep (in general) Good  Pain is worse with: walking, bending, sitting, inactivity, and standing Pain improves with: rest and medication Relief from Meds: 5  walk with assistance use a cane use a walker how many minutes can you walk?   ability to climb steps?  no do you drive?  no use a wheelchair transfers alone  disabled: date disabled . Do you have any goals in this area?  no  bladder control problems bowel control problems numbness tremor tingling trouble walking dizziness confusion depression anxiety loss of taste or smell  Any changes since last visit?  no  Any changes  since last visit?  no    Family History  Problem Relation Age of Onset   Heart attack Mother    Colon cancer Mother    Heart attack Father    Stroke Sister    Hemophilia Child    Cancer Other    Coronary artery disease Other    Cancer Brother    Cancer Maternal Aunt    Cancer Maternal Uncle    Cancer Paternal Aunt    Cancer Paternal Uncle    Cancer Brother    Social History   Socioeconomic History   Marital status: Divorced    Spouse name: Not on file   Number of children: 4   Years of education: Not on file   Highest education level: Not on file  Occupational History   Occupation: Disabled  Tobacco Use   Smoking status: Never   Smokeless tobacco: Never  Vaping Use   Vaping Use: Never used  Substance and Sexual Activity   Alcohol use: No    Alcohol/week: 0.0 standard drinks   Drug use: No   Sexual activity: Not on file  Other Topics Concern   Not on file  Social History Narrative   Patient does not get regular exercise   Denies caffeine use    Social Determinants of Health   Financial Resource Strain: Not on file  Food Insecurity: Not on file  Transportation Needs: Not on file  Physical Activity: Not on file  Stress: Not on file  Social Connections: Not on file   Past  Surgical History:  Procedure Laterality Date   ABDOMINAL HYSTERECTOMY     Arm Surgery Bilateral    due to fall-pt broke both forearms   BIOPSY N/A 07/01/2013   Procedure: BIOPSY;  Surgeon: Rogene Houston, MD;  [facility];  Service: Endoscopy;  Laterality: N/A;   CHOLECYSTECTOMY     COLONOSCOPY  08/06/2011   Procedure: COLONOSCOPY;  Surgeon: Rogene Houston, MD;  [facility];  Service: Endoscopy;  Laterality: N/A;  215   COLONOSCOPY WITH PROPOFOL N/A 12/31/2012   Procedure: COLONOSCOPY WITH PROPOFOL;  Surgeon: Rogene Houston, MD;  [facility]: [facility];  Service: Endoscopy;  Laterality: N/A;  in cecum at 0814, total withdrawal time 37mn   COLONOSCOPY WITH PROPOFOL N/A  02/05/2018   Procedure: COLONOSCOPY WITH PROPOFOL;  Surgeon: RRogene Houston MD;  [facility];  Service: Endoscopy;  Laterality: N/A;  1:25   CORONARY ANGIOPLASTY WITH STENT PLACEMENT     CYSTOSCOPY W/ URETERAL STENT PLACEMENT Right 11/10/2019   [place] URETEROSCOPY AND STENT PLACEMENT Right 12/22/2019   Procedure: CYSTOSCOPY WITH RIGHT URETERAL STENT REMOVAL; RIGHT RETROGRADE PYELOGRAM,RIGHT URETEROSCOPY;  Surgeon: MCleon Gustin MD;  [facility];  Service: Urology;  Laterality: Right;   ESOPHAGEAL DILATION N/A 06/07/2015   Procedure: ESOPHAGEAL DILATION;  Surgeon: NRogene Houston MD;  [facility];  Service: Endoscopy;  Laterality: N/A;   ESOPHAGOGASTRODUODENOSCOPY N/A 06/07/2015   Procedure: ESOPHAGOGASTRODUODENOSCOPY (EGD);  Surgeon: NRogene Houston MD;  [facility];  Service: Endoscopy;  Laterality: N/A;  2:45 - moved to 1:00 - Ann to notify pt   ESOPHAGOGASTRODUODENOSCOPY (EGD) WITH PROPOFOL N/A 12/31/2012   Procedure: ESOPHAGOGASTRODUODENOSCOPY (EGD) WITH PROPOFOL;  Surgeon: NRogene Houston MD;  [facility];  Service: Endoscopy;  Laterality: N/A;  GE junction 37,   ESOPHAGOGASTRODUODENOSCOPY (EGD) WITH PROPOFOL N/A 07/01/2013   Procedure: ESOPHAGOGASTRODUODENOSCOPY (EGD) WITH PROPOFOL;  Surgeon: NRogene Houston MD;  [facility];  Service: Endoscopy;  Laterality: N/A;  9[place]/A 12/31/2012   Procedure: MALONEY DILATION;  Surgeon: NRogene Houston MD;  [facility];  Service: Endoscopy;  Laterality: N/A;  used a # 54,#56   MALONEY DILATION N/A 07/01/2013   Procedure: MALONEY DILATION;  Surgeon: NRogene Houston MD;  [facility];  Service: Endoscopy;  Laterality: N/A;  54/58; no heme present   POLYPECTOMY  02/05/2018   Procedure: POLYPECTOMY;  Surgeon: RRogene Houston MD;  [facility];  Service: Endoscopy;;  distal rectum (HS)   STONE EXTRACTION WITH BASKET  Right 12/22/2019   Procedure: STONE EXTRACTION WITH BASKET;  Surgeon: MCleon Gustin MD;  [facility]: [facility];  Service: Urology;  Laterality: Right;   Past Medical History:  Diagnosis Date   Anxiety and depression    Arthritis    CAD (coronary artery disease)    Stent placement circumflex coronary 2007, catheterization 2008 patent stents. 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  High cholesterol    Hx of blood clots    Hypertension    MI (myocardial infarction) ([place])    2007   Obstructive sleep apnea    CPAP, setting ?2   Palpitations    Sleep apnea    Tachycardia    BP 120/77   Pulse 85   Temp 99 F (37.2 C)   Ht 5' 4"  (1.626 m)   Wt 240 lb (108.9 kg)   SpO2 91%   BMI 41.20 kg/m   Opioid Risk Score:   Fall Risk Score:  `1  Depression screen PHQ 2/9  Depression screen [facility] 2/9 10/11/2020 01/24/2020 11/18/2019 10/26/2019 09/26/2019 03/24/2019 07/16/2018  Decreased Interest 1 0 1 0 0 1 1  Down, Depressed, Hopeless - 0 1 0 0 1 1  PHQ - 2 Score 1 0 2 0 0 2 2  Altered sleeping - - - - - - -  Tired, decreased energy - - - - - - -  Change in appetite - - - - - - -  Feeling bad or failure about yourself  - - - - - - -  Trouble concentrating - - - - - - -  Moving slowly or fidgety/restless - - - - - - -  Suicidal thoughts - - - - - - -  PHQ-9 Score - - - - - - -  Difficult doing work/chores - - - - - - -  Some recent data might be hidden    Review of Systems  Constitutional: Negative.   HENT: Negative.    Eyes: Negative.   Respiratory: Negative.    Cardiovascular: Negative.   Gastrointestinal: Negative.    Endocrine: Negative.   Genitourinary: Negative.   Musculoskeletal:  Positive for arthralgias, back pain, gait problem, myalgias, neck pain and neck stiffness.  Skin: Negative.   Allergic/Immunologic: Negative.   Neurological:  Positive for dizziness, tremors, weakness and numbness.       Tingling  Psychiatric/Behavioral:  Positive for confusion and dysphoric mood. The patient is nervous/anxious.   All other systems reviewed and are negative.     Objective:   Physical Exam Vitals and nursing note reviewed.  Constitutional:      Appearance: Normal appearance. She is obese.  Neck:     Comments: Cervical Paraspinal Tenderness: C-5-C-6 Cardiovascular:     Rate and Rhythm: Normal rate and regular rhythm.     Pulses: Normal pulses.     Heart sounds: Normal heart sounds.  Pulmonary:     Effort: Pulmonary effort is normal.     Breath sounds: Normal breath sounds.  Musculoskeletal:     Cervical back: Normal range of motion and neck supple.     Comments: Normal Muscle Bulk and Muscle Testing Reveals:  Upper Extremities: Full ROM and Muscle Strength 5/5 Bilateral AC Joint Tenderness Thoracic and Lumbar Hypersensitivity Bilateral Greater Trochanter Tenderness Lower Extremities: Full ROM and Muscle Strength 5/5 Arises from Table Slowly using walker for support Antalgic Gait     Skin:    General: Skin is warm and dry.  Neurological:     Mental Status: She is alert and oriented to person, place, and time.  Psychiatric:        Mood and Affect: Mood normal.        Behavior: Behavior normal.         Assessment & Plan:  1.Lumbar pain lumbar spondylosis:  Encouraged to continue to increase Activity as tolerated. 11/13/2020 Continue current medication regimen.  Refilled:  MS Contin 30 MG one tablet every 12 hours #60. Boyfriend Joe is dispensing Ms. Abdo Medication she reports. We will continue the opioid monitoring program, this consists of regular clinic visits, examinations,  urine drug screen, pill counts as well as use of [place] Controlled Substance Reporting system. A 12 month History has been reviewed on the [place] on 11/13/2020.  2. Lumbar Radiculitis: Continue current medication regimen with Gabapentin. 11/13/2020 3. Bilateral Knee Pain/ Degenerative: Continue Voltaren Gel. Continue to Monitor. 11/13/2020 4. Severe diabetic poly neuropathy: Continue current medication regimen with Gabapentin. 11/13/2020 5. Insomnia: Continue Armodafinil Psychiatry Following. 11/13/2020 6.Anxiety/Depression: Continue Current Medication regimen as prescribed by PCP and   Psychiatry. 11/13/2020 7. De Quervains Tenosynovitis: No Complaints Today. 11/13/2020 8. Bilateral  Greater Trochanteric Bursitis: Continue with Ice and Heat Therapy. Continue to Monitor. 11/13/2020.  9. Cervicalgia/ Cervical Radiculitis:Continue Gabapentin. Continue with HEP as Tolerated and continue to monitor. 11/13/2020 10. Chronic Bilateral Thoracic Back Pain: Continue HEP as Tolerated. Continue current medication regimen. Continue to monitor. 11/13/2020.   F/U in 1 month

## 2020-11-14 ENCOUNTER — Ambulatory Visit: Payer: Medicare Other | Admitting: "Endocrinology

## 2020-11-16 ENCOUNTER — Other Ambulatory Visit: Payer: Self-pay | Admitting: *Deleted

## 2020-11-16 MED ORDER — LISINOPRIL 5 MG PO TABS
5.0000 mg | ORAL_TABLET | Freq: Every day | ORAL | 2 refills | Status: DC
Start: 2020-11-16 — End: 2021-05-08

## 2020-11-24 ENCOUNTER — Other Ambulatory Visit: Payer: Self-pay | Admitting: "Endocrinology

## 2020-11-27 ENCOUNTER — Encounter: Payer: Self-pay | Admitting: "Endocrinology

## 2020-11-27 ENCOUNTER — Other Ambulatory Visit: Payer: Self-pay

## 2020-11-27 ENCOUNTER — Ambulatory Visit (INDEPENDENT_AMBULATORY_CARE_PROVIDER_SITE_OTHER): Payer: Medicare Other | Admitting: "Endocrinology

## 2020-11-27 VITALS — BP 140/92 | HR 92 | Ht 64.0 in | Wt 258.0 lb

## 2020-11-27 DIAGNOSIS — E782 Mixed hyperlipidemia: Secondary | ICD-10-CM | POA: Diagnosis not present

## 2020-11-27 DIAGNOSIS — E559 Vitamin D deficiency, unspecified: Secondary | ICD-10-CM | POA: Diagnosis not present

## 2020-11-27 DIAGNOSIS — I200 Unstable angina: Secondary | ICD-10-CM

## 2020-11-27 DIAGNOSIS — E1159 Type 2 diabetes mellitus with other circulatory complications: Secondary | ICD-10-CM | POA: Diagnosis not present

## 2020-11-27 DIAGNOSIS — I10 Essential (primary) hypertension: Secondary | ICD-10-CM | POA: Diagnosis not present

## 2020-11-27 LAB — POCT GLYCOSYLATED HEMOGLOBIN (HGB A1C): HbA1c, POC (controlled diabetic range): 10.1 % — AB (ref 0.0–7.0)

## 2020-11-27 MED ORDER — FIASP FLEXTOUCH 100 UNIT/ML ~~LOC~~ SOPN: 14.0000 [IU] | PEN_INJECTOR | Freq: Three times a day (TID) | SUBCUTANEOUS | 2 refills | Status: DC

## 2020-11-27 MED ORDER — TRESIBA FLEXTOUCH 200 UNIT/ML ~~LOC~~ SOPN: 100.0000 [IU] | PEN_INJECTOR | Freq: Every day | SUBCUTANEOUS | 2 refills | Status: DC

## 2020-11-27 NOTE — Progress Notes (Signed)
11/27/2020   Endocrinology follow-up note   Subjective:    Patient ID: [name], female    DOB: 10/17/1950. Patient is returning with incomplete glycemic profile, still significantly above target.  She still has not used her rapid acting insulin correctly.  -She is on follow-up for currently uncontrolled, complicated type 2 diabetes; hyperlipidemia; hypertension.  Past Medical History:  Diagnosis Date   Anxiety and depression    Arthritis    CAD (coronary artery disease)    Stent placement circumflex coronary 2007, catheterization 2008 patent stents. 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    High cholesterol    Hx of blood clots    Hypertension    MI (myocardial infarction) ([place])    2007   Obstructive sleep apnea    CPAP, setting ?2   Palpitations    Sleep apnea    Tachycardia    Past Surgical History:  Procedure Laterality Date   ABDOMINAL HYSTERECTOMY     Arm Surgery Bilateral    due to fall-pt broke both forearms   BIOPSY N/A 07/01/2013   Procedure: BIOPSY;  Surgeon: Rogene Houston, MD;  Location: [facility];  Service: Endoscopy;  Laterality: N/A;   CHOLECYSTECTOMY     COLONOSCOPY  08/06/2011   Procedure: COLONOSCOPY;  Surgeon: Rogene Houston, MD;  [facility];  Service: Endoscopy;  Laterality: N/A;  215   COLONOSCOPY WITH PROPOFOL N/A 12/31/2012   Procedure: COLONOSCOPY WITH PROPOFOL;  Surgeon: Rogene Houston, MD;  Location: [facility];  Service: Endoscopy;  Laterality: N/A;  in cecum at 0814, total withdrawal time 19mn   COLONOSCOPY WITH PROPOFOL N/A 02/05/2018   Procedure: COLONOSCOPY WITH  PROPOFOL;  Surgeon: RRogene Houston MD;  [facility];  Service: Endoscopy;  Laterality: N/A;  1:25   CORONARY ANGIOPLASTY WITH STENT PLACEMENT     CYSTOSCOPY W/ URETERAL STENT PLACEMENT Right [place]/10/2019   [place] PLACEMENT Right 12/22/2019   Procedure: CYSTOSCOPY WITH RIGHT URETERAL STENT REMOVAL; RIGHT RETROGRADE PYELOGRAM,RIGHT URETEROSCOPY;  Surgeon: MCleon Gustin MD;  Location: [facility];  Service: Urology;  Laterality: Right;   ESOPHAGEAL DILATION N/A 06/07/2015   Procedure: ESOPHAGEAL DILATION;  Surgeon: NRogene Houston MD;  [facility];  Service: Endoscopy;  Laterality: N/A;   ESOPHAGOGASTRODUODENOSCOPY N/A 06/07/2015   Procedure: ESOPHAGOGASTRODUODENOSCOPY (EGD);  Surgeon: NRogene Houston MD;  [facility];  Service: Endoscopy;  Laterality: N/A;  2:45 - moved to 1:00 - Ann to notify pt   ESOPHAGOGASTRODUODENOSCOPY (EGD) WITH PROPOFOL N/A 12/31/2012   Procedure: ESOPHAGOGASTRODUODENOSCOPY (EGD) WITH PROPOFOL;  Surgeon: NRogene Houston MD;  Location: [facility];  Service: Endoscopy;  Laterality: N/A;  GE junction 37,   ESOPHAGOGASTRODUODENOSCOPY (EGD) WITH PROPOFOL N/A 07/01/2013   Procedure: ESOPHAGOGASTRODUODENOSCOPY (EGD) WITH PROPOFOL;  Surgeon: NRogene Houston MD;  [facility]: [facility];  Service: Endoscopy;  Laterality: N/A;  [place] 12/31/2012   Procedure: MALONEY DILATION;  Surgeon:  Rogene Houston, MD;  Location: [facility];  Service: Endoscopy;  Laterality: N/A;  used a # 54,#56   MALONEY DILATION N/A 07/01/2013   Procedure: MALONEY DILATION;  Surgeon: Rogene Houston, MD;  Location: [facility];  Service: Endoscopy;  Laterality: N/A;  54/58; no heme present   POLYPECTOMY  02/05/2018   Procedure: POLYPECTOMY;  Surgeon: Rogene Houston, MD;  [facility];  Service: Endoscopy;;  distal rectum (HS)   STONE EXTRACTION WITH BASKET Right 12/22/2019   Procedure: STONE  EXTRACTION WITH BASKET;  Surgeon: Cleon Gustin, MD;  Location: [facility];  Service: Urology;  Laterality: Right;   Social History   Socioeconomic History   Marital status: Divorced    Spouse name: Not on file   Number of children: 4   Years of education: Not on file   Highest education level: Not on file  Occupational History   Occupation: Disabled  Tobacco Use   Smoking status: Never   Smokeless tobacco: Never  Vaping Use   Vaping Use: Never used  Substance and Sexual Activity   Alcohol use: No    Alcohol/week: 0.0 standard drinks   Drug use: No   Sexual activity: Not on file  Other Topics Concern   Not on file  Social History Narrative   Patient does not get regular exercise   Denies caffeine use    Social Determinants of Health   Financial Resource Strain: Not on file  Food Insecurity: Not on file  Transportation Needs: Not on file  Physical Activity: Not on file  Stress: Not on file  Social Connections: Not on file   Outpatient Encounter Medications as of 11/27/2020  Medication Sig   ALPRAZolam (XANAX) 0.5 MG tablet Take 0.5 mg by mouth 2 (two) times daily as needed for anxiety or sleep.   Armodafinil 150 MG tablet Take 150 mg by mouth daily.   aspirin EC 81 MG tablet Take 1 tablet (81 mg total) by mouth daily.   atorvastatin (LIPITOR) 40 MG tablet Take 40 mg by mouth daily.   B-D ULTRAFINE III SHORT PEN 31G X 8 MM MISC Inject into the skin 4 (four) times daily.   blood glucose meter kit and supplies KIT 1 each by Does not apply route 2 (two) times daily. Dispense based on patient and insurance preference. Use bid as directed. E11.65   Blood Glucose Monitoring Suppl (ACCU-CHEK GUIDE ME) w/Device KIT 1 Piece by Does not apply route as directed. Test BG qid. E11.65   Blood Glucose Monitoring Suppl (TRUE METRIX METER) DEVI USE AS DIRECTED TO TEST BLOOD SUGAR TWICE DAILY   buPROPion (WELLBUTRIN SR) 100 MG 12 hr tablet Take 100 mg by mouth daily.   Cholecalciferol  (VITAMIN D3) 125 MCG (5000 UT) CAPS TAKE ONE CAPSULE BY MOUTH DAILY (Patient taking differently: Take 5,000 Units by mouth daily.)   clotrimazole-betamethasone (LOTRISONE) cream Apply to affected area 2 times daily for 21 days (Patient taking differently: Apply 1 application topically 2 (two) times daily.)   diclofenac Sodium (VOLTAREN) 1 % GEL APPLY TO BOTH KNEES THREE TIMES DAILY (Patient taking differently: Apply 2 g topically in the morning, at noon, and at bedtime.)   diltiazem (CARDIZEM CD) 240 MG 24 hr capsule Take 240 mg by mouth daily.   doxepin (SINEQUAN) 25 MG capsule Take 75 mg by mouth at bedtime.    estradiol (ESTRACE) 0.5 MG tablet Take 0.5 mg by mouth daily.    FEROSUL 325 (65 Fe) MG  tablet Take 325 mg by mouth every morning.   furosemide (LASIX) 40 MG tablet Take 1 tablet by mouth daily.   gabapentin (NEURONTIN) 400 MG capsule Take 400 mg by mouth 3 (three) times daily.   glucose blood (ACCU-CHEK GUIDE) test strip Use as instructed   insulin aspart (FIASP FLEXTOUCH) 100 UNIT/ML FlexTouch Pen Inject 14-20 Units into the skin 3 (three) times daily before meals.   insulin degludec (TRESIBA FLEXTOUCH) 200 UNIT/ML FlexTouch Pen Inject 100 Units into the skin at bedtime.   lisinopril (ZESTRIL) 5 MG tablet Take 1 tablet (5 mg total) by mouth daily.   meloxicam (MOBIC) 7.5 MG tablet Take 7.5 mg by mouth in the morning and at bedtime.    metFORMIN (GLUCOPHAGE) 500 MG tablet TAKE 1 TABLET BY MOUTH TWICE DAILY (Patient taking differently: Take 500 mg by mouth 2 (two) times daily with a meal.)   metoCLOPramide (REGLAN) 5 MG tablet Take 5 mg by mouth 3 (three) times daily as needed for nausea or vomiting.    metoprolol succinate (TOPROL-XL) 100 MG 24 hr tablet Take 100 mg by mouth daily. Take with or immediately following a meal.   montelukast (SINGULAIR) 10 MG tablet Take 10 mg by mouth at bedtime.    morphine (MS CONTIN) 30 MG 12 hr tablet Take 1 tablet (30 mg total) by mouth every 12  (twelve) hours.   nitroGLYCERIN (NITROSTAT) 0.4 MG SL tablet Place 1 tablet (0.4 mg total) under the tongue every 5 (five) minutes as needed for chest pain (up to 3 doses. If taking 3rd dose call 911).   ondansetron (ZOFRAN) 4 MG tablet Take 4 mg by mouth every other day.   pantoprazole (PROTONIX) 40 MG tablet TAKE 1 TABLET BY MOUTH TWICE DAILY BEFORE MEALS (Patient taking differently: Take 40 mg by mouth 2 (two) times daily before a meal.)   polyethylene glycol (MIRALAX / GLYCOLAX) 17 g packet Take 17 g by mouth daily as needed for moderate constipation.   potassium chloride (K-DUR,KLOR-CON) 10 MEQ tablet Take 10 mEq by mouth 2 (two) times daily.   PROAIR HFA 108 (90 Base) MCG/ACT inhaler Inhale 2 puffs into the lungs every 4 (four) hours as needed for wheezing or shortness of breath.    promethazine (PHENERGAN) 25 MG tablet Take 1 tablet (25 mg total) by mouth 4 (four) times daily as needed. (Patient taking differently: Take 25 mg by mouth 4 (four) times daily as needed for nausea or vomiting.)   SURE COMFORT INS SYR 1CC/30G 30G X 5/16" 1 ML MISC 1 each by Other route daily.   topiramate (TOPAMAX) 200 MG tablet Take 200 mg by mouth at bedtime.   [DISCONTINUED] FIASP FLEXTOUCH 100 UNIT/ML FlexTouch Pen INJECT 10-16 UNITS INTO THE SKIN THREE TIMES DAILY BEFORE MEALS   [DISCONTINUED] glipiZIDE (GLUCOTROL XL) 5 MG 24 hr tablet Take 1 tablet (5 mg total) by mouth daily with breakfast.   [DISCONTINUED] insulin degludec (TRESIBA FLEXTOUCH) 200 UNIT/ML FlexTouch Pen Inject 90 Units into the skin at bedtime.   No facility-administered encounter medications on file as of 11/27/2020.   ALLERGIES: Allergies  Allergen Reactions   Amphetamine-Dextroamphetamine Swelling   Nitrofuran Derivatives Itching and Swelling   Amphetamine-Dextroamphet Er Swelling   Pregabalin Swelling   Topiramate Other (See Comments)    Tongue tingle   Verelan [Verapamil] Rash   VACCINATION STATUS:  There is no immunization  history on file for this patient.  Diabetes She presents for her follow-up diabetic visit. She has type 2  diabetes mellitus. Onset time: She was diagnosed at approximate age of 26 years. Her disease course has been worsening. There are no hypoglycemic associated symptoms. Pertinent negatives for hypoglycemia include no confusion, headaches, pallor or seizures. Pertinent negatives for diabetes include no chest pain, no fatigue, no polydipsia, no polyphagia and no polyuria. There are no hypoglycemic complications. Symptoms are improving. Diabetic complications include autonomic neuropathy, heart disease, peripheral neuropathy, PVD and retinopathy. Risk factors for coronary artery disease include dyslipidemia, diabetes mellitus, obesity, hypertension and sedentary lifestyle. Current diabetic treatments: Lantus 65 units daily at bedtime, Humalog 15 units 3 times a day before meals. She mentions allergy to Victoza, Byetta, and metformin. Her weight is increasing steadily. She is following a generally unhealthy diet. When asked about meal planning, she reported none. She has not had a previous visit with a dietitian. She never participates in exercise. Her home blood glucose trend is increasing steadily. Her breakfast blood glucose range is generally >200 mg/dl. Her bedtime blood glucose range is generally >200 mg/dl. Her overall blood glucose range is >200 mg/dl. (She presents with slightly better A1c of 10.1% improved from 11.2%.  However, she continues to have persistent hyperglycemia.  Admittedly, she misses at least a third of her prandial insulin injection opportunities.  She did not have any hypoglycemia.    ) An ACE inhibitor/angiotensin II receptor blocker is not being taken. Eye exam is current.  Hyperlipidemia This is a chronic problem. The current episode started more than 1 year ago. The problem is uncontrolled. Recent lipid tests were reviewed and are high. Exacerbating diseases include diabetes and  obesity. Pertinent negatives include no chest pain, myalgias or shortness of breath. Current antihyperlipidemic treatment includes statins. Risk factors for coronary artery disease include diabetes mellitus, dyslipidemia, hypertension, obesity and a sedentary lifestyle.  Hypertension This is a chronic problem. The current episode started more than 1 year ago. Pertinent negatives include no chest pain, headaches, palpitations or shortness of breath. Risk factors for coronary artery disease include diabetes mellitus, dyslipidemia, obesity and sedentary lifestyle. Hypertensive end-organ damage includes CAD/MI, PVD and retinopathy.   Review of systems: Limited as above.  Objective:    BP (!) 140/92   Pulse 92   Ht 5' 4"  (1.626 m)   Wt 258 lb (117 kg)   BMI 44.29 kg/m   Wt Readings from Last 3 Encounters:  11/27/20 258 lb (117 kg)  11/13/20 240 lb (108.9 kg)  10/11/20 241 lb (109.3 kg)     CMP     Component Value Date/Time   NA 136 08/08/2020 0244   NA 139 07/05/2020 1338   K 3.7 08/08/2020 0244   CL 99 08/08/2020 0244   CO2 28 08/08/2020 0244   GLUCOSE 233 (H) 08/08/2020 0244   BUN 10 08/08/2020 0244   BUN 16 07/05/2020 1338   CREATININE 0.67 08/08/2020 0244   CREATININE 0.70 11/02/2018 1105   CALCIUM 8.7 (L) 08/08/2020 0244   PROT 6.5 07/05/2020 1338   ALBUMIN 3.6 (L) 07/05/2020 1338   AST 14 07/05/2020 1338   ALT 17 07/05/2020 1338   ALKPHOS 111 07/05/2020 1338   BILITOT 0.4 07/05/2020 1338   GFRNONAA >60 08/08/2020 0244   GFRNONAA 91 11/02/2018 1105   GFRAA >60 10/27/2019 1130   GFRAA 105 11/02/2018 1105   Diabetic Labs (most recent): Lab Results  Component Value Date   HGBA1C 10.1 (A) 11/27/2020   HGBA1C 11.2 (H) 08/08/2020   HGBA1C 10.4 (A) 07/09/2020    Lipid Panel  Component Value Date/Time   CHOL 115 08/08/2020 0244   CHOL 127 07/05/2020 1338   TRIG 118 08/08/2020 0244   HDL 37 (L) 08/08/2020 0244   HDL 46 07/05/2020 1338   CHOLHDL 3.1 08/08/2020  0244   VLDL 24 08/08/2020 0244   LDLCALC 54 08/08/2020 0244   LDLCALC 43 07/05/2020 1338   LDLCALC 90 07/06/2018 0936     Assessment & Plan:   1. Type 2 diabetes mellitus with vascular disease ([place]) - Patient has currently uncontrolled symptomatic type 2 DM since  70 years of age.  She presents with slightly better A1c of 10.1% improved from 11.2%.  However, she continues to have persistent hyperglycemia.  Admittedly, she misses at least a third of her prandial insulin injection opportunities.  She did not have any hypoglycemia.      Recent labs reviewed.  - Her diabetes is complicated by obesity/sedentary life, coronary artery disease status post stent placement and patient remains at a high risk for more acute and chronic complications of diabetes which include CAD, CVA, CKD, retinopathy, and neuropathy. These are all discussed in detail with the patient.  - I have counseled the patient on diet management and weight loss, by adopting a carbohydrate restricted/protein rich diet.  - she acknowledges that there is a room for improvement in her food and drink choices. - Suggestion is made for her to avoid simple carbohydrates  from her diet including Cakes, Sweet Desserts, Ice Cream, Soda (diet and regular), Sweet Tea, Candies, Chips, Cookies, Store Bought Juices, Alcohol in Excess of  1-2 drinks a day, Artificial Sweeteners,  Coffee Creamer, and "Sugar-free" Products, Lemonade. This will help patient to have more stable blood glucose profile and potentially avoid unintended weight gain.  - Patient is advised to stick to a routine mealtimes to eat 3 meals  a day and avoid unnecessary snacks ( to snack only to correct hypoglycemia).   - I have approached patient with the following individualized plan to manage diabetes and patient agrees:   -In light of her presentation with significantly above target postprandial glycemic profile, she will need prandial insulin.    -She is advised to  increase Tresiba to 100 units nightly, increase Fiasp to 14  units 3 times daily AC for Premeal blood glucose readings between 90 and 150 mg per DL, plus correction dose of Fiasp for blood glucose readings above 150 mg/day.    -The need for monitoring blood glucose strictly-before meals and at bedtime is discussed with patient.  I discussed and prescribed and new set of Accu-Chek testing supplies for her. -She is warned not to take insulin without proper monitoring of blood glucose.  -Her insurance did not provide coverage for CGM device.  -She continues to have normal renal function, will continue to benefit from low-dose metformin-500 mg p.o. twice daily.  At this point, she is advised to discontinue glipizide.    -Patient is encouraged to call clinic for blood glucose levels less than 70 or above 200 mg /dl.  -She has intolerance to GLP-1 receptor agonists.  2) BP/HTN: Her blood pressures not controlled to target.  She has adequate medications, advised to continue including metoprolol XL 50 mg p.o. daily.  3) Lipids/HPL: She has uncontrolled hypertriglyceridemia , still high at 439  LDL better at 50, HDL at 77. She is advised to continue Lipitor 40 mg p.o. nightly.    She is educated on the fact that   better control of diabetes will help for  triglycerides.   4) Chronic Care/Health Maintenance:  -Patient is on Statin medications and encouraged to continue to follow up with Ophthalmology, Podiatrist at least yearly or according to recommendations, and advised to   stay away from smoking. I have recommended yearly flu vaccine and pneumonia vaccination at least every 5 years;  and  sleep for at least 7 hours a day. -She will benefit from a set of diabetic shoes, paperwork filled for her.   - I advised patient to maintain close follow up with Glenda Chroman, MD for primary care needs.   I spent 41 minutes in the care of the patient today including review of labs from [facility], Lipids, Thyroid  Function, Hematology (current and previous including abstractions from other facilities); face-to-face time discussing  her blood glucose readings/logs, discussing hypoglycemia and hyperglycemia episodes and symptoms, medications doses, her options of short and long term treatment based on the latest standards of care / guidelines;  discussion about incorporating lifestyle medicine;  and documenting the encounter.    Please refer to Patient Instructions for Blood Glucose Monitoring and Insulin/Medications Dosing Guide"  in media tab for additional information. Please  also refer to " Patient Self Inventory" in the Media  tab for reviewed elements of pertinent patient history.  [name] participated in the discussions, expressed understanding, and voiced agreement with the above plans.  All questions were answered to her satisfaction. she is encouraged to contact clinic should she have any questions or concerns prior to her return visit.   Follow up plan: - Patient to go to emergency room for better evaluation of chest pain. Return in about 3 months (around 02/26/2021) for F/U with Pre-visit Labs, Meter, Logs, A1c here.Glade Lloyd, MD Phone: [phone]  Fax: [phone]  This note was partially dictated with voice recognition software. Similar sounding words can be transcribed inadequately or may not  be corrected upon review.  11/27/2020, 1:42 PM

## 2020-11-27 NOTE — Patient Instructions (Signed)

## 2020-12-08 ENCOUNTER — Other Ambulatory Visit: Payer: Self-pay | Admitting: "Endocrinology

## 2020-12-11 ENCOUNTER — Other Ambulatory Visit: Payer: Self-pay

## 2020-12-11 ENCOUNTER — Encounter: Payer: Medicare Other | Attending: Registered Nurse | Admitting: Registered Nurse

## 2020-12-11 ENCOUNTER — Encounter: Payer: Self-pay | Admitting: Registered Nurse

## 2020-12-11 ENCOUNTER — Other Ambulatory Visit: Payer: Self-pay | Admitting: "Endocrinology

## 2020-12-11 VITALS — BP 133/74 | HR 78 | Temp 99.0°F

## 2020-12-11 DIAGNOSIS — M5412 Radiculopathy, cervical region: Secondary | ICD-10-CM | POA: Insufficient documentation

## 2020-12-11 DIAGNOSIS — M5416 Radiculopathy, lumbar region: Secondary | ICD-10-CM | POA: Diagnosis present

## 2020-12-11 DIAGNOSIS — M542 Cervicalgia: Secondary | ICD-10-CM | POA: Diagnosis present

## 2020-12-11 DIAGNOSIS — M48062 Spinal stenosis, lumbar region with neurogenic claudication: Secondary | ICD-10-CM | POA: Insufficient documentation

## 2020-12-11 DIAGNOSIS — M7061 Trochanteric bursitis, right hip: Secondary | ICD-10-CM | POA: Diagnosis present

## 2020-12-11 DIAGNOSIS — M1711 Unilateral primary osteoarthritis, right knee: Secondary | ICD-10-CM | POA: Insufficient documentation

## 2020-12-11 DIAGNOSIS — I200 Unstable angina: Secondary | ICD-10-CM

## 2020-12-11 DIAGNOSIS — G894 Chronic pain syndrome: Secondary | ICD-10-CM | POA: Diagnosis present

## 2020-12-11 DIAGNOSIS — M546 Pain in thoracic spine: Secondary | ICD-10-CM | POA: Diagnosis present

## 2020-12-11 DIAGNOSIS — M7062 Trochanteric bursitis, left hip: Secondary | ICD-10-CM | POA: Diagnosis present

## 2020-12-11 DIAGNOSIS — M1712 Unilateral primary osteoarthritis, left knee: Secondary | ICD-10-CM | POA: Diagnosis present

## 2020-12-11 DIAGNOSIS — Z79891 Long term (current) use of opiate analgesic: Secondary | ICD-10-CM | POA: Diagnosis present

## 2020-12-11 DIAGNOSIS — Z5181 Encounter for therapeutic drug level monitoring: Secondary | ICD-10-CM | POA: Diagnosis present

## 2020-12-11 MED ORDER — MORPHINE SULFATE ER 30 MG PO TBCR: 30.0000 mg | EXTENDED_RELEASE_TABLET | Freq: Two times a day (BID) | ORAL | 0 refills | Status: DC

## 2020-12-11 NOTE — Progress Notes (Signed)
Subjective:    Patient ID: [name], female    DOB: Jun 02, 1950, 70 y.o.   MRN: [id number]  HPI: [name] is a 70 y.o. female who returns for follow up appointment for chronic pain and medication refill. She states her pain is located in her neck radiating into her bilateral shoulders, upper- lower back pain radiating into her bilateral hips and bilateral lower extremities. She also reports bilateral knee pain and bilateral feep pain with tingling and burning. She rates her pain 8. Her current exercise regime is walking and performing stretching exercises.  [name] Morphine equivalent is 60.00 MME.   Last UDS was Performed on 09/11/2020, it was consistent.    Pain Inventory Average Pain 7 Pain Right Now 8 My pain is sharp, burning, dull, stabbing, tingling, and aching  In the last 24 hours, has pain interfered with the following? General activity 10 Relation with others 10 Enjoyment of life 10 What TIME of day is your pain at its worst? morning  and night Sleep (in general) Fair  Pain is worse with: walking, bending, sitting, inactivity, standing, unsure, and some activites Pain improves with: medication Relief from Meds: 2  Family History  Problem Relation Age of Onset   Heart attack Mother    Colon cancer Mother    Heart attack Father    Stroke Sister    Hemophilia Child    Cancer Other    Coronary artery disease Other    Cancer Brother    Cancer Maternal Aunt    Cancer Maternal Uncle    Cancer Paternal Aunt    Cancer Paternal Uncle    Cancer Brother    Social History   Socioeconomic History   Marital status: Divorced    Spouse name: Not on file   Number of children: 4   Years of education: Not on file   Highest education level: Not on file  Occupational History   Occupation: Disabled  Tobacco Use   Smoking status: Never   Smokeless tobacco: Never  Vaping Use   Vaping Use: Never used  Substance and Sexual Activity   Alcohol use: No     Alcohol/week: 0.0 standard drinks   Drug use: No   Sexual activity: Not on file  Other Topics Concern   Not on file  Social History Narrative   Patient does not get regular exercise   Denies caffeine use    Social Determinants of Health   Financial Resource Strain: Not on file  Food Insecurity: Not on file  Transportation Needs: Not on file  Physical Activity: Not on file  Stress: Not on file  Social Connections: Not on file   Past Surgical History:  Procedure Laterality Date   ABDOMINAL HYSTERECTOMY     Arm Surgery Bilateral    due to fall-pt broke both forearms   BIOPSY N/A 07/01/2013   Procedure: BIOPSY;  Surgeon: Rogene Houston, MD;  [facility]: [facility];  Service: Endoscopy;  Laterality: N/A;   CHOLECYSTECTOMY     COLONOSCOPY  08/06/2011   Procedure: COLONOSCOPY;  Surgeon: Rogene Houston, MD;  [facility];  Service: Endoscopy;  Laterality: N/A;  215   COLONOSCOPY WITH PROPOFOL N/A 12/31/2012   Procedure: COLONOSCOPY WITH PROPOFOL;  Surgeon: Rogene Houston, MD;  Location: [facility];  Service: Endoscopy;  Laterality: N/A;  in cecum at 0814, total withdrawal time 35mn   COLONOSCOPY WITH PROPOFOL N/A 02/05/2018   Procedure: COLONOSCOPY WITH PROPOFOL;  Surgeon: RLaural Golden  Mechele Dawley, MD;  [facility];  Service: Endoscopy;  Laterality: N/A;  1:25   CORONARY ANGIOPLASTY WITH STENT PLACEMENT     CYSTOSCOPY W/ URETERAL STENT PLACEMENT Right 11/10/2019   at [place], URETEROSCOPY AND STENT PLACEMENT Right 12/22/2019   Procedure: CYSTOSCOPY WITH RIGHT URETERAL STENT REMOVAL; RIGHT RETROGRADE PYELOGRAM,RIGHT URETEROSCOPY;  Surgeon: Cleon Gustin, MD;  [facility]: [facility];  Service: Urology;  Laterality: Right;   ESOPHAGEAL DILATION N/A 06/07/2015   Procedure: ESOPHAGEAL DILATION;  Surgeon: Rogene Houston, MD;  [facility];  Service: Endoscopy;  Laterality: N/A;   ESOPHAGOGASTRODUODENOSCOPY N/A 06/07/2015   Procedure:  ESOPHAGOGASTRODUODENOSCOPY (EGD);  Surgeon: Rogene Houston, MD;  [facility];  Service: Endoscopy;  Laterality: N/A;  2:45 - moved to 1:00 - Ann to notify pt   ESOPHAGOGASTRODUODENOSCOPY (EGD) WITH PROPOFOL N/A 12/31/2012   Procedure: ESOPHAGOGASTRODUODENOSCOPY (EGD) WITH PROPOFOL;  Surgeon: Rogene Houston, MD;  [facility];  Service: Endoscopy;  Laterality: N/A;  GE junction 37,   ESOPHAGOGASTRODUODENOSCOPY (EGD) WITH PROPOFOL N/A 07/01/2013   Procedure: ESOPHAGOGASTRODUODENOSCOPY (EGD) WITH PROPOFOL;  Surgeon: Rogene Houston, MD;  [facility];  Service: Endoscopy;  Laterality: N/A;  [place] N/A 12/31/2012   Procedure: MALONEY DILATION;  Surgeon: Rogene Houston, MD;  [facility];  Service: Endoscopy;  Laterality: N/A;  used a # 54,#56   MALONEY DILATION N/A 07/01/2013   Procedure: MALONEY DILATION;  Surgeon: Rogene Houston, MD;  [facility];  Service: Endoscopy;  Laterality: N/A;  54/58; no heme present   POLYPECTOMY  02/05/2018   Procedure: POLYPECTOMY;  Surgeon: Rogene Houston, MD;  [facility];  Service: Endoscopy;;  distal rectum (HS)   STONE EXTRACTION WITH BASKET Right 12/22/2019   Procedure: STONE EXTRACTION WITH BASKET;  Surgeon: Cleon Gustin, MD;  [facility];  Service: Urology;  Laterality: Right;   Past Surgical History:  Procedure Laterality Date   ABDOMINAL HYSTERECTOMY     Arm Surgery Bilateral    due to fall-pt broke both forearms   BIOPSY N/A 07/01/2013   Procedure: BIOPSY;  Surgeon: Rogene Houston, MD;  [facility];  Service: Endoscopy;  Laterality: N/A;   CHOLECYSTECTOMY     COLONOSCOPY  08/06/2011   Procedure: COLONOSCOPY;  Surgeon: Rogene Houston, MD;  [facility];  Service: Endoscopy;  Laterality: N/A;  215   COLONOSCOPY WITH PROPOFOL N/A 12/31/2012   Procedure: COLONOSCOPY WITH PROPOFOL;  Surgeon: Rogene Houston, MD;  [facility];  Service: Endoscopy;  Laterality: N/A;  in  cecum at 0814, total withdrawal time 21mn   COLONOSCOPY WITH PROPOFOL N/A 02/05/2018   Procedure: COLONOSCOPY WITH PROPOFOL;  Surgeon: RRogene Houston MD;  [facility];  Service: Endoscopy;  Laterality: N/A;  1:25   CORONARY ANGIOPLASTY WITH STENT PLACEMENT     CYSTOSCOPY W/ URETERAL STENT PLACEMENT Right 11/10/2019   [place]AC[place] Right 12/22/2019   Procedure: CYSTOSCOPY WITH RIGHT URETERAL STENT REMOVAL; RIGHT RETROGRADE PYELOGRAM,RIGHT URETEROSCOPY;  Surgeon: MCleon Gustin MD;  [facility];  Service: Urology;  Laterality: Right;   ESOPHAGEAL DILATION N/A 06/07/2015   Procedure: ESOPHAGEAL DILATION;  Surgeon: NRogene Houston MD;  [facility];  Service: Endoscopy;  Laterality: N/A;   ESOPHAGOGASTRODUODENOSCOPY N/A 06/07/2015   Procedure: ESOPHAGOGASTRODUODENOSCOPY (EGD);  Surgeon: NRogene Houston  MD;  [facility];  Service: Endoscopy;  Laterality: N/A;  2:45 - moved to 1:00 - Ann to notify pt   ESOPHAGOGASTRODUODENOSCOPY (EGD) WITH PROPOFOL N/A 12/31/2012   Procedure: ESOPHAGOGASTRODUODENOSCOPY (EGD) WITH PROPOFOL;  Surgeon: Rogene Houston, MD;  [facility];  Service: Endoscopy;  Laterality: N/A;  GE junction 37,   ESOPHAGOGASTRODUODENOSCOPY (EGD) WITH PROPOFOL N/A 07/01/2013   Procedure: ESOPHAGOGASTRODUODENOSCOPY (EGD) WITH PROPOFOL;  Surgeon: Rogene Houston, MD;  [facility];  Service: Endoscopy;  Laterality: N/A;  [place] N/A 12/31/2012   Procedure: MALONEY DILATION;  Surgeon: Rogene Houston, MD;  [facility];  Service: Endoscopy;  Laterality: N/A;  used a # 54,#56   MALONEY DILATION N/A 07/01/2013   Procedure: MALONEY DILATION;  Surgeon: Rogene Houston, MD;  [facility];  Service: Endoscopy;  Laterality: N/A;  54/58; no heme present   POLYPECTOMY  02/05/2018   Procedure: POLYPECTOMY;  Surgeon: Rogene Houston, MD;  [facility];  Service: Endoscopy;;  distal rectum (HS)   STONE EXTRACTION WITH BASKET Right 12/22/2019   Procedure: STONE EXTRACTION WITH BASKET;  Surgeon: Cleon Gustin, MD;  [facility];  Service: Urology;  Laterality: Right;   Past Medical History:  Diagnosis Date   Anxiety and depression    Arthritis    CAD (coronary artery disease)    Stent placement circumflex coronary 2007, catheterization 2008 patent stents. 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    High cholesterol    Hx of blood clots    Hypertension    MI (myocardial infarction) ([place])    2007   Obstructive sleep apnea    CPAP, setting ?2   Palpitations    Sleep apnea    Tachycardia    BP 133/74   Pulse 78   Temp 99 F (37.2 C) (Oral)   SpO2 91%   Opioid Risk Score:   Fall Risk Score:  `1  Depression screen PHQ 2/9  Depression screen [facility] 2/9 10/11/2020 01/24/2020 11/18/2019 10/26/2019 09/26/2019 03/24/2019 07/16/2018  Decreased Interest 1 0 1 0 0 1 1  Down, Depressed, Hopeless - 0 1 0 0 1 1  PHQ - 2 Score 1 0 2 0 0 2 2  Altered sleeping - - - - - - -  Tired, decreased energy - - - - - - -  Change in appetite - - - - - - -  Feeling bad or failure about yourself  - - - - - - -  Trouble concentrating - - - - - - -  Moving slowly or fidgety/restless - - - - - - -  Suicidal thoughts - - - - - - -  PHQ-9 Score - - - - - - -  Difficult doing work/chores - - - - - - -  Some recent data might be hidden      Review of Systems  Musculoskeletal:  Positive for arthralgias, back pain and myalgias.  Neurological:  Positive for weakness.  All other systems  reviewed and are negative.     Objective:   Physical Exam Vitals and nursing note reviewed.  Constitutional:      Appearance: Normal appearance.  Neck:     Comments: Cervical Paraspinal Tenderness: C-5-C-6 Cardiovascular:     Rate and Rhythm: Normal rate and regular rhythm.     Pulses: Normal pulses.     Heart sounds: Normal heart sounds.  Pulmonary:     Effort: Pulmonary effort is normal.     Breath sounds: Normal breath sounds.  Musculoskeletal:     Cervical back: Normal range of motion and neck supple.     Right lower leg: Edema present.     Left lower leg: Edema present.     Comments: Normal Muscle Bulk and Muscle Testing Reveals:  Upper Extremities: Full ROM and Muscle Strength 5/5 Bilateral AC Joint Tenderness  Thoracic and Lumbar Hypersensitivity Bilateral Greater Trochanter Tenderness  Lower Extremities: Full ROM and Muscle Strength 5/5 Bilateral Lower Extremities Flexion Produces Pain into her Bilateral Lower Extremity and Bilateral Feet Arrived in wheelchair    Skin:    General: Skin is warm and dry.  Neurological:     Mental Status: She is alert and oriented to person, place, and time.  Psychiatric:        Mood and Affect: Mood normal.        Behavior: Behavior normal.         Assessment & Plan:  1.Lumbar pain lumbar spondylosis:  Encouraged to continue to increase Activity as tolerated. 12/11/2020 Continue current medication regimen.  Refilled: MS Contin 30 MG one tablet every 12 hours #60. Boyfriend Joe is dispensing [name] Medication she reports. We will continue the opioid monitoring program, this consists of regular clinic visits, examinations, urine drug screen, pill counts as well as use of [place] Controlled Substance Reporting system. A 12 month History has been reviewed on the [place] on 12/11/2020.  2. Lumbar Radiculitis: Continue current medication regimen with Gabapentin. 12/11/2020 3.  Bilateral Knee Pain/ Degenerative: Continue Voltaren Gel. Continue to Monitor. 12/11/2020 4. Severe diabetic poly neuropathy: Continue current medication regimen with Gabapentin. 12/11/2020 5. Insomnia: Continue Armodafinil Psychiatry Following. 12/11/2020 6.Anxiety/Depression: Continue Current Medication regimen as prescribed by PCP and   Psychiatry. 12/11/2020 7. [place]erv[place] Tenosynovitis: No Complaints Today. 12/11/2020 8. Bilateral  Greater Trochanteric Bursitis: Continue with Ice and Heat Therapy. Continue to Monitor. 12/11/2020.  9. Cervicalgia/ Cervical Radiculitis:Continue Gabapentin. Continue with HEP as Tolerated and continue to monitor. 12/11/2020 10. Chronic Bilateral Thoracic Back Pain: Continue HEP as Tolerated. Continue current medication regimen. Continue to monitor. 12/11/2020.   F/U in 1 month

## 2021-01-08 ENCOUNTER — Telehealth: Payer: Self-pay | Admitting: Family Medicine

## 2021-01-08 NOTE — Telephone Encounter (Signed)
Pt c/o swelling: STAT is pt has developed SOB within 24 hours  If swelling, where is the swelling located? From the knee down   How much weight have you gained and in what time span? Not sure. Patient does not weigh herself daily   Have you gained 3 pounds in a day or 5 pounds in a week?   Do you have a log of your daily weights (if so, list)? ni  Are you currently taking a fluid pill? Yes 2 daily   Are you currently SOB? A little bit, it comes and goes for the past few days   Have you traveled recently? No  Caregiver called with patient in the background. The Caregiver said the patient's legs are very swollen and painful. She almost went to the hospital last night. She was hoping to get the patient an appointment to be seen today,

## 2021-01-08 NOTE — Telephone Encounter (Signed)
Keep appt with Jonni Sanger, would continue higher diuretic dose for now  Zandra Abts MD

## 2021-01-08 NOTE — Telephone Encounter (Signed)
Reports swelling both feet Reports SOB Denies dizziness or chest pain Reports symptoms started 2 weeks ago and has gradually worsened Takes furosemide 80 mg daily since last week, adjusted by PCP visit. Takes potassium 10 meq twice daily. Says she sees no improvement Does not weigh daily but says she will start Appointment arranged by schedulers to see Emily Dung NP for 01/10/21 @2 :30 pm

## 2021-01-09 NOTE — Progress Notes (Deleted)
Cardiology Office Note  Date: 01/09/2021   ID: Emily, Hopkins 29-Jun-1950, MRN [id number]  PCP:  Glenda Chroman, MD  Cardiologist:  Carlyle Dolly, MD Electrophysiologist:  None   Chief Complaint: Swelling in both legs  History of Present Illness: [name] is a 70 y.o. female with a history of CAD/MI, COPD, DM 2, chest pain, chronic diastolic heart failure, OSA, atrial fibrillation, HTN,  Last seen by Dr. Harl Bowie on 08/07/2020.  She had been experiencing 3 to 4 days of chest pain described as pressure-like under left breast 7 out of 10 in severity.  Could occur at rest or with exertion.  Associated with shortness of breath, hot, sweaty, nausea.  Pain lasting between 15 and 20 minutes.  Had been increasing in frequency occurring a few times a day.  Was improved with sublingual nitroglycerin.  She was sent to [facility] emergency room for complete initial evaluation.  Anticipating transfer to [facility] and having cardiac catheterization the next day for unstable angina.  She was in agreement for catheterization.   She presented later to [facility].  Initial troponin was negative and EKG showed no acute changes.  Troponins remained negative x2 despite fairly persistent pain.  Echo showed normal EF, G1 DD, elevated LA pressure, normal RV, no valvular disease, normal IVC, no evidence of pericardial effusion.  Chest pain was felt to be musculoskeletal in etiology.  She was continuing aspirin, statin, and beta-blocker.  Blood pressure was normal during admission.Marland Kitchen  BNP was normal and she was euvolemic. HgbA1c was 11.2. She was planning to follow up with endocrinologist Dr Dorris Fetch.  She is here for 3-week follow-up.  She states she has had no further chest pain.  However she stated when the chest pain occurred prior to her presentation to [facility] sublingual nitroglycerin had resolved the pain.  She has not had a stress test since 2018.  She has multiple risk factors and pre-existing CAD.   She denies any current anginal or exertional symptoms.  She is not very active on a daily basis.  She states she does have sleep apnea and currently does not use a CPAP machine.  She states during the [place] pandemic she was supposed to be evaluated further at [facility] but this apparently fell through at some point and she has had no further follow-up.  She does have some chronic dyspnea.  Her diabetes is not well controlled and she sees Dr. Dorris Fetch.  She has issues with morbid obesity with a BMI of 41.  Past Medical History:  Diagnosis Date   Anxiety and depression    Arthritis    CAD (coronary artery disease)    Stent placement circumflex coronary 2007, catheterization 2008 patent stents. 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    High cholesterol    Hx of blood clots    Hypertension    MI (myocardial infarction) ([place])    2007   Obstructive sleep apnea    CPAP, setting ?2   Palpitations  Sleep apnea    Tachycardia     Past Surgical History:  Procedure Laterality Date   ABDOMINAL HYSTERECTOMY     Arm Surgery Bilateral    due to fall-pt broke both forearms   BIOPSY N/A 07/01/2013   Procedure: BIOPSY;  Surgeon: Rogene Houston, MD;  Location: [facility] OR[facility];  Service: Endoscopy;  Laterality: N/A;   CHOLECYSTECTOMY     COLONOSCOPY  08/06/2011   Procedure: COLONOSCOPY;  Surgeon: Rogene Houston, MD;  Location: [facility] ENDO SUITE;  Service: Endoscopy;  Laterality: N/A;  215   COLONOSCOPY WITH PROPOFOL N/A 12/31/2012   Procedure: COLONOSCOPY WITH PROPOFOL;  Surgeon: Rogene Houston, MD;  Location: [facility] ORS;  Service: Endoscopy;  Laterality:  N/A;  in cecum at 0814, total withdrawal time 43mn   COLONOSCOPY WITH PROPOFOL N/A 02/05/2018   Procedure: COLONOSCOPY WITH PROPOFOL;  Surgeon: RRogene Houston MD;  Location: [facility] END[facility];  Service: Endoscopy;  Laterality: N/A;  1:25   CORONARY ANGIOPLASTY WITH STENT PLACEMENT     CYSTOSCOPY W/ URETERAL STENT PLACEMENT Right 11/10/2019   at [facility] URETEROSCOPY AND STENT PLACEMENT Right 12/22/2019   Procedure: CYSTOSCOPY WITH RIGHT URETERAL STENT REMOVAL; RIGHT RETROGRADE PYELOGRAM,RIGHT URETEROSCOPY;  Surgeon: MCleon Gustin MD;  Location: [facility] ORS;  Service: Urology;  Laterality: Right;   ESOPHAGEAL DILATION N/A 06/07/2015   Procedure: ESOPHAGEAL DILATION;  Surgeon: NRogene Houston MD;  Location: [facility];  Service: Endoscopy;  Laterality: N/A;   ESOPHAGOGASTRODUODENOSCOPY N/A 06/07/2015   Procedure: ESOPHAGOGASTRODUODENOSCOPY (EGD);  Surgeon: NRogene Houston MD;  Location: [facility];  Service: Endoscopy;  Laterality: N/A;  2:45 - moved to 1:00 - Ann to notify pt   ESOPHAGOGASTRODUODENOSCOPY (EGD) WITH PROPOFOL N/A 12/31/2012   Procedure: ESOPHAGOGASTRODUODENOSCOPY (EGD) WITH PROPOFOL;  Surgeon: NRogene Houston MD;  Location: [facility];  Service: Endoscopy;  Laterality: N/A;  GE junction 37,   ESOPHAGOGASTRODUODENOSCOPY (EGD) WITH PROPOFOL N/A 07/01/2013   Procedure: ESOPHAGOGASTRODUODENOSCOPY (EGD) WITH PROPOFOL;  Surgeon: NRogene Houston MD;  Location: [facility];  Service: Endoscopy;  Laterality: N/A;  [place] 12/31/2012   Procedure: MALONEY DILATION;  Surgeon: NRogene Houston MD;  Location: [facility];  Service: Endoscopy;  Laterality: N/A;  used a # 54,#56   MALONEY DILATION N/A 07/01/2013   Procedure: MALONEY DILATION;  Surgeon: NRogene Houston MD;  Location: [facility];  Service: Endoscopy;  Laterality: N/A;  54/58; no heme present   POLYPECTOMY  02/05/2018   Procedure: POLYPECTOMY;  Surgeon: RRogene Houston MD;  Location: [facility]  END[facility];  Service: Endoscopy;;  distal rectum (HS)   STONE EXTRACTION WITH BASKET Right 12/22/2019   Procedure: STONE EXTRACTION WITH BASKET;  Surgeon: MCleon Gustin MD;  Location: [facility];  Service: Urology;  Laterality: Right;    Current Outpatient Medications  Medication Sig Dispense Refill   ALPRAZolam (XANAX) 0.5 MG tablet Take 0.5 mg by mouth 2 (two) times daily as needed for anxiety or sleep.     Armodafinil 150 MG tablet Take 150 mg by mouth daily.     aspirin EC 81 MG tablet Take 1 tablet (81 mg total) by mouth daily.     atorvastatin (LIPITOR) 40 MG tablet Take 40 mg by mouth daily.     B-D ULTRAFINE III SHORT PEN 31G X 8 MM MISC Inject into the skin 4 (four) times daily.     blood glucose meter kit and supplies KIT 1  each by Does not apply route 2 (two) times daily. Dispense based on patient and insurance preference. Use bid as directed. E11.65 1 each 0   Blood Glucose Monitoring Suppl (ACCU-CHEK GUIDE ME) w/Device KIT Use to test BG qid. E11.65 1 kit 0   Blood Glucose Monitoring Suppl (TRUE METRIX METER) DEVI USE AS DIRECTED TO TEST BLOOD SUGAR TWICE DAILY     buPROPion (WELLBUTRIN SR) 100 MG 12 hr tablet Take 100 mg by mouth daily.     Cholecalciferol (VITAMIN D3) 125 MCG (5000 UT) CAPS TAKE ONE CAPSULE BY MOUTH DAILY (Patient taking differently: Take 5,000 Units by mouth daily.) 90 capsule 0   diclofenac Sodium (VOLTAREN) 1 % GEL APPLY TO BOTH KNEES THREE TIMES DAILY (Patient taking differently: Apply 2 g topically in the morning, at noon, and at bedtime.) 300 g 2   diltiazem (CARDIZEM CD) 240 MG 24 hr capsule Take 240 mg by mouth daily.     doxepin (SINEQUAN) 25 MG capsule Take 75 mg by mouth at bedtime.      estradiol (ESTRACE) 0.5 MG tablet Take 0.5 mg by mouth daily.      FEROSUL 325 (65 Fe) MG tablet Take 325 mg by mouth every morning.     furosemide (LASIX) 40 MG tablet Take 1 tablet by mouth daily.     gabapentin (NEURONTIN) 400 MG capsule Take 400 mg by mouth 3  (three) times daily.  5   glucose blood (ACCU-CHEK GUIDE) test strip Use as instructed 150 each 2   insulin aspart (FIASP FLEXTOUCH) 100 UNIT/ML FlexTouch Pen Inject into the skin.     insulin degludec (TRESIBA FLEXTOUCH) 200 UNIT/ML FlexTouch Pen Inject 100 Units into the skin at bedtime. 18 mL 2   insulin degludec (TRESIBA FLEXTOUCH) 200 UNIT/ML FlexTouch Pen Inject into the skin.     lisinopril (ZESTRIL) 5 MG tablet Take 1 tablet (5 mg total) by mouth daily. 90 tablet 2   meloxicam (MOBIC) 7.5 MG tablet Take 7.5 mg by mouth in the morning and at bedtime.   4   metFORMIN (GLUCOPHAGE) 500 MG tablet TAKE 1 TABLET BY MOUTH TWICE DAILY (Patient taking differently: Take 500 mg by mouth 2 (two) times daily with a meal.) 60 tablet 3   metoCLOPramide (REGLAN) 5 MG tablet Take 5 mg by mouth 3 (three) times daily as needed for nausea or vomiting.   1   metoprolol succinate (TOPROL-XL) 100 MG 24 hr tablet Take 100 mg by mouth daily. Take with or immediately following a meal.     montelukast (SINGULAIR) 10 MG tablet Take 10 mg by mouth at bedtime.      morphine (MS CONTIN) 30 MG 12 hr tablet Take 1 tablet (30 mg total) by mouth every 12 (twelve) hours. 60 tablet 0   nitroGLYCERIN (NITROSTAT) 0.4 MG SL tablet Place 1 tablet (0.4 mg total) under the tongue every 5 (five) minutes as needed for chest pain (up to 3 doses. If taking 3rd dose call 911). 25 tablet 3   ondansetron (ZOFRAN) 4 MG tablet Take 4 mg by mouth every other day.     pantoprazole (PROTONIX) 40 MG tablet TAKE 1 TABLET BY MOUTH TWICE DAILY BEFORE MEALS (Patient taking differently: Take 40 mg by mouth 2 (two) times daily before a meal.) 60 tablet 1   polyethylene glycol (MIRALAX / GLYCOLAX) 17 g packet Take 17 g by mouth daily as needed for moderate constipation.     potassium chloride (KLOR-CON) 10 MEQ tablet Take  10 mEq by mouth 2 (two) times daily.     PROAIR HFA 108 (90 Base) MCG/ACT inhaler Inhale 2 puffs into the lungs every 4 (four) hours  as needed for wheezing or shortness of breath.   8   promethazine (PHENERGAN) 25 MG tablet Take 1 tablet (25 mg total) by mouth 4 (four) times daily as needed. (Patient taking differently: Take 25 mg by mouth 4 (four) times daily as needed for nausea or vomiting.) 30 tablet 0   SURE COMFORT INS SYR 1CC/30G 30G X 5/16" 1 ML MISC 1 each by Other route daily.     topiramate (TOPAMAX) 200 MG tablet Take 200 mg by mouth at bedtime.  3   TRESIBA FLEXTOUCH 100 UNIT/ML FlexTouch Pen SMARTSIG:80 Unit(s) SUB-Q Every Night     No current facility-administered medications for this visit.   Allergies:  Amphetamine-dextroamphetamine, Nitrofuran derivatives, Amphetamine-dextroamphet er, Pregabalin, Topiramate, and Verelan [verapamil]   Social History: The patient  reports that she has never smoked. She has never used smokeless tobacco. She reports that she does not drink alcohol and does not use drugs.   Family History: The patient's family history includes Cancer in her brother, brother, maternal aunt, maternal uncle, paternal aunt, paternal uncle, and another family member; Colon cancer in her mother; Coronary artery disease in an other family member; Heart attack in her father and mother; Hemophilia in her child; Stroke in her sister.   ROS:  Please see the history of present illness. Otherwise, complete review of systems is positive for none.  All other systems are reviewed and negative.   Physical Exam: VS:  There were no vitals taken for this visit., BMI There is no height or weight on file to calculate BMI.  Wt Readings from Last 3 Encounters:  11/27/20 258 lb (117 kg)  11/13/20 240 lb (108.9 kg)  10/11/20 241 lb (109.3 kg)    General: Morbidly obese patient appears comfortable at rest. Neck: Supple, no elevated JVP or carotid bruits, no thyromegaly. Lungs: Clear to auscultation, nonlabored breathing at rest. Cardiac: Regular rate and rhythm, no S3 or significant systolic murmur, no pericardial  rub. Extremities: No pitting edema, distal pulses 2+. Skin: Warm and dry. Musculoskeletal: No kyphosis. Neuropsychiatric: Alert and oriented x3, affect grossly appropriate.  ECG:  EKG 08/07/2020 heart rate of 91 normal sinus rhythm.  Recent Labwork: 07/05/2020: ALT 17; AST 14; Magnesium 1.7; TSH 2.910 08/08/2020: B Natriuretic Peptide 33.5; BUN 10; Creatinine, Ser 0.67; Hemoglobin 10.6; Platelets 382; Potassium 3.7; Sodium 136     Component Value Date/Time   CHOL 115 08/08/2020 0244   CHOL 127 07/05/2020 1338   TRIG 118 08/08/2020 0244   HDL 37 (L) 08/08/2020 0244   HDL 46 07/05/2020 1338   CHOLHDL 3.1 08/08/2020 0244   VLDL 24 08/08/2020 0244   LDLCALC 54 08/08/2020 0244   LDLCALC 43 07/05/2020 1338   LDLCALC 90 07/06/2018 0936    Other Studies Reviewed Today:  Echocardiogram 08/08/2020  1. Left ventricular ejection fraction, by estimation, is 55 to 60%. The left ventricle has normal function. The left ventricle has no regional wall motion abnormalities. There is mild concentric left ventricular hypertrophy. Left ventricular diastolic parameters are consistent with Grade I diastolic dysfunction (impaired relaxation). Elevated left atrial pressure. 2. Right ventricular systolic function is normal. The right ventricular size is normal. 3. The mitral valve is normal in structure. No evidence of mitral valve regurgitation. No evidence of mitral stenosis. 4. The aortic valve is normal in structure.  Aortic valve regurgitation is trivial. No aortic stenosis is present. 5. The inferior vena cava is normal in size with greater than 50% respiratory variability, suggesting right atrial pressure of 3 mmHg. Comparison(s): Prior images unable to be directly viewed, comparison made by report only    Echocardiogram 04/05/2019 [facility]):  Summary   1. The left ventricle is normal in size with mildly increased wall thickness.   2. The left ventricular systolic function is normal,  LVEF is visually estimated at > 55%. Grade 2 diastolic dysfunction.   3. The left atrium is mildly dilated in size.   4. The right ventricle is mildly dilated in size, with normal systolic function.   5. The right atrium is mildly dilated  in size.     08/2016 nuclear stress There was no ST segment deviation noted during stress. The study is normal. This is a low risk study. Nuclear stress EF: 68%.     04/2020 30 day monitor - no arrhytnmias  Assessment and Plan:  1. CAD in native artery   2. Essential hypertension, benign   3. Non-cardiac chest pain   4. Uncontrolled type 2 diabetes mellitus with hyperglycemia ([place])   5. Obstructive sleep apnea     1. CAD in native artery chest pain History of CAD/MI with previous stents.  Recent presentation for chest pain and was ruled out for ACS at [facility].  She states nitroglycerin relieved her chest pain.  Please schedule follow-up Lexiscan Myoview to assess for ischemia.  Continue aspirin 81 mg daily.  Continue sublingual nitroglycerin as needed.  Continue Toprol-XL 100 mg daily.  Continue atorvastatin 40 mg p.o. daily.  2. Essential hypertension, benign Blood pressure is reasonably controlled today at 132/70.  Continue Toprol-XL 100 mg daily.  Continue Cardizem CD 240 mg daily.  Continue lisinopril 5 mg daily.  Continue Lasix 40 mg daily.  Continue potassium supplementation 10 mEq daily.  3. Non-cardiac chest pain Recent ED visit with diagnosis of chest wall pain.  4. Uncontrolled type 2 diabetes mellitus with hyperglycemia [facility]) Patient states her diabetes is a little better control.  She is seeing Dr. Dorris Fetch endocrinology.  5.  Obstructive sleep apnea Patient has a history of obstructive sleep apnea.  She needs follow-up with pulmonology.  Please refer her to Dr. Elsworth Soho for her sleep apnea.  She may need a new CPAP machine and possible repeat sleep study  Medication Adjustments/Labs and Tests Ordered: Current medicines are  reviewed at length with the patient today.  Concerns regarding medicines are outlined above.   Disposition: Follow-up with Dr. Harl Bowie or APP 6 to 8 weeks  Signed, Levell July, NP 01/09/2021 9:03 PM    [facility] [place], [place], [place] [address] Phone: [phone]; Fax: [phone]

## 2021-01-10 ENCOUNTER — Ambulatory Visit: Payer: 59 | Admitting: Family Medicine

## 2021-01-11 ENCOUNTER — Other Ambulatory Visit: Payer: Self-pay

## 2021-01-11 ENCOUNTER — Encounter: Payer: Medicare Other | Admitting: Registered Nurse

## 2021-01-16 ENCOUNTER — Telehealth: Payer: Self-pay | Admitting: Registered Nurse

## 2021-01-16 MED ORDER — MORPHINE SULFATE ER 30 MG PO TBCR: 30.0000 mg | EXTENDED_RELEASE_TABLET | Freq: Two times a day (BID) | ORAL | 0 refills | Status: DC

## 2021-01-16 NOTE — Telephone Encounter (Signed)
Received phone call from [facility], states that she will not have enough medicine until her next visit on January 31, 2021.

## 2021-01-16 NOTE — Telephone Encounter (Signed)
[name] was admitted to [facility] on 01/10/2021 and discharged home on 01/14/2021. She was admitted with Acute Cystitis without hematuria. Discharge summary was reviewed.  PMP was Reviewed.MS Contin was e-scribed today.  [name] appointment was scheduled. [name] is aware of the above and verbalizes understanding.

## 2021-01-18 ENCOUNTER — Other Ambulatory Visit: Payer: Self-pay | Admitting: "Endocrinology

## 2021-01-18 MED ORDER — HUMALOG KWIKPEN 200 UNIT/ML ~~LOC~~ SOPN: 14.0000 [IU] | PEN_INJECTOR | Freq: Three times a day (TID) | SUBCUTANEOUS | 2 refills | Status: DC

## 2021-01-25 ENCOUNTER — Other Ambulatory Visit: Payer: Self-pay | Admitting: "Endocrinology

## 2021-01-31 ENCOUNTER — Encounter: Payer: 59 | Admitting: Registered Nurse

## 2021-02-01 ENCOUNTER — Other Ambulatory Visit: Payer: Self-pay

## 2021-02-01 ENCOUNTER — Encounter: Payer: 59 | Attending: Registered Nurse | Admitting: Registered Nurse

## 2021-02-01 VITALS — BP 109/69 | HR 73

## 2021-02-01 DIAGNOSIS — M1711 Unilateral primary osteoarthritis, right knee: Secondary | ICD-10-CM

## 2021-02-01 DIAGNOSIS — M5412 Radiculopathy, cervical region: Secondary | ICD-10-CM

## 2021-02-01 DIAGNOSIS — M5416 Radiculopathy, lumbar region: Secondary | ICD-10-CM | POA: Diagnosis present

## 2021-02-01 DIAGNOSIS — Z79891 Long term (current) use of opiate analgesic: Secondary | ICD-10-CM | POA: Diagnosis present

## 2021-02-01 DIAGNOSIS — E1140 Type 2 diabetes mellitus with diabetic neuropathy, unspecified: Secondary | ICD-10-CM | POA: Diagnosis present

## 2021-02-01 DIAGNOSIS — Z5181 Encounter for therapeutic drug level monitoring: Secondary | ICD-10-CM | POA: Diagnosis present

## 2021-02-01 DIAGNOSIS — M546 Pain in thoracic spine: Secondary | ICD-10-CM | POA: Diagnosis present

## 2021-02-01 DIAGNOSIS — G894 Chronic pain syndrome: Secondary | ICD-10-CM | POA: Diagnosis present

## 2021-02-01 DIAGNOSIS — M48062 Spinal stenosis, lumbar region with neurogenic claudication: Secondary | ICD-10-CM

## 2021-02-01 DIAGNOSIS — M1712 Unilateral primary osteoarthritis, left knee: Secondary | ICD-10-CM | POA: Diagnosis present

## 2021-02-01 DIAGNOSIS — M542 Cervicalgia: Secondary | ICD-10-CM | POA: Diagnosis present

## 2021-02-01 MED ORDER — MORPHINE SULFATE ER 30 MG PO TBCR: 30.0000 mg | EXTENDED_RELEASE_TABLET | Freq: Two times a day (BID) | ORAL | 0 refills | Status: DC

## 2021-02-01 NOTE — Progress Notes (Signed)
Subjective:    Patient ID: [name], female    DOB: Jul 25, 1950, 70 y.o.   MRN: [id number]  HPI: [name] is a 70 y.o. female who returns for follow up appointment for chronic pain and medication refill. She states her  pain is located in her neck radiating into her bilateral shoulders, mid- lower back pain radiating into her bilateral lower extremities and bilateral feet with tingling and numbness. She also reports bilateral knee pain. She rates her pain 8. Her current exercise regime is walking with the walker in her home, she arrived in wheelchair today.   [name] [facility] and discharged home on 01/14/2021, she was admitted with Acute Cystitis hematuria, discharge summary was reviewed.   [name] Morphine equivalent is 60.00 MME.   UDS was ordered today.    Pain Inventory Average Pain 7 Pain Right Now 8 My pain is aching  In the last 24 hours, has pain interfered with the following? General activity 10 Relation with others 10 Enjoyment of life 0 What TIME of day is your pain at its worst? morning  and night Sleep (in general) Fair  Pain is worse with: walking, bending, sitting, inactivity, standing, and some activites Pain improves with: medication Relief from Meds: 8  Family History  Problem Relation Age of Onset   Heart attack Mother    Colon cancer Mother    Heart attack Father    Stroke Sister    Hemophilia Child    Cancer Other    Coronary artery disease Other    Cancer Brother    Cancer Maternal Aunt    Cancer Maternal Uncle    Cancer Paternal Aunt    Cancer Paternal Uncle    Cancer Brother    Social History   Socioeconomic History   Marital status: Divorced    Spouse name: Not on file   Number of children: 4   Years of education: Not on file   Highest education level: Not on file  Occupational History   Occupation: Disabled  Tobacco Use   Smoking status: Never   Smokeless tobacco: Never  Vaping Use   Vaping  Use: Never used  Substance and Sexual Activity   Alcohol use: No    Alcohol/week: 0.0 standard drinks   Drug use: No   Sexual activity: Not on file  Other Topics Concern   Not on file  Social History Narrative   Patient does not get regular exercise   Denies caffeine use    Social Determinants of Health   Financial Resource Strain: Not on file  Food Insecurity: Not on file  Transportation Needs: Not on file  Physical Activity: Not on file  Stress: Not on file  Social Connections: Not on file   Past Surgical History:  Procedure Laterality Date   ABDOMINAL HYSTERECTOMY     Arm Surgery Bilateral    due to fall-pt broke both forearms   BIOPSY N/A 07/01/2013   Procedure: BIOPSY;  Surgeon: Rogene Houston, MD;  [facility]: [facility];  Service: Endoscopy;  Laterality: N/A;   CHOLECYSTECTOMY     COLONOSCOPY  08/06/2011   Procedure: COLONOSCOPY;  Surgeon: Rogene Houston, MD;  [facility];  Service: Endoscopy;  Laterality: N/A;  215   COLONOSCOPY WITH PROPOFOL N/A 12/31/2012   Procedure: COLONOSCOPY WITH PROPOFOL;  Surgeon: Rogene Houston, MD;  Location: [facility];  Service: Endoscopy;  Laterality: N/A;  in cecum at 0814, total  withdrawal time 36mn   COLONOSCOPY WITH PROPOFOL N/A 02/05/2018   Procedure: COLONOSCOPY WITH PROPOFOL;  Surgeon: RRogene Houston MD;  [facility];  Service: Endoscopy;  Laterality: N/A;  1:25   CORONARY ANGIOPLASTY WITH STENT PLACEMENT     CYSTOSCOPY W/ URETERAL STENT PLACEMENT Right [place]/10/2019   [place]ACEMENT Right 12/22/2019   Procedure: CYSTOSCOPY WITH RIGHT URETERAL STENT REMOVAL; RIGHT RETROGRADE PYELOGRAM,RIGHT URETEROSCOPY;  Surgeon: MCleon Gustin MD;  [facility]: [facility];  Service: Urology;  Laterality: Right;   ESOPHAGEAL DILATION N/A 06/07/2015   Procedure: ESOPHAGEAL DILATION;  Surgeon: NRogene Houston MD;  [facility];  Service: Endoscopy;   Laterality: N/A;   ESOPHAGOGASTRODUODENOSCOPY N/A 06/07/2015   Procedure: ESOPHAGOGASTRODUODENOSCOPY (EGD);  Surgeon: NRogene Houston MD;  [facility];  Service: Endoscopy;  Laterality: N/A;  2:45 - moved to 1:00 - Ann to notify pt   ESOPHAGOGASTRODUODENOSCOPY (EGD) WITH PROPOFOL N/A 12/31/2012   Procedure: ESOPHAGOGASTRODUODENOSCOPY (EGD) WITH PROPOFOL;  Surgeon: NRogene Houston MD;  [facility];  Service: Endoscopy;  Laterality: N/A;  GE junction 37,   ESOPHAGOGASTRODUODENOSCOPY (EGD) WITH PROPOFOL N/A 07/01/2013   Procedure: ESOPHAGOGASTRODUODENOSCOPY (EGD) WITH PROPOFOL;  Surgeon: NRogene Houston MD;  [facility];  Service: Endoscopy;  Laterality: N/A;  [place] 12/31/2012   Procedure: MALONEY DILATION;  Surgeon: NRogene Houston MD;  [facility];  Service: Endoscopy;  Laterality: N/A;  used a # 54,#56   MALONEY DILATION N/A 07/01/2013   Procedure: MALONEY DILATION;  Surgeon: NRogene Houston MD;  [facility];  Service: Endoscopy;  Laterality: N/A;  54/58; no heme present   POLYPECTOMY  02/05/2018   Procedure: POLYPECTOMY;  Surgeon: RRogene Houston MD;  [facility];  Service: Endoscopy;;  distal rectum (HS)   STONE EXTRACTION WITH BASKET Right 12/22/2019   Procedure: STONE EXTRACTION WITH BASKET;  Surgeon: MCleon Gustin MD;  [facility];  Service: Urology;  Laterality: Right;   Past Surgical History:  Procedure Laterality Date   ABDOMINAL HYSTERECTOMY     Arm Surgery Bilateral    due to fall-pt broke both forearms   BIOPSY N/A 07/01/2013   Procedure: BIOPSY;  Surgeon: NRogene Houston MD;  [facility];  Service: Endoscopy;  Laterality: N/A;   CHOLECYSTECTOMY     COLONOSCOPY  08/06/2011   Procedure: COLONOSCOPY;  Surgeon: NRogene Houston MD;  [facility];  Service: Endoscopy;  Laterality: N/A;  215   COLONOSCOPY WITH PROPOFOL N/A 12/31/2012   Procedure: COLONOSCOPY WITH PROPOFOL;  Surgeon: NRogene Houston MD;  [facility];  Service: Endoscopy;  Laterality: N/A;  in cecum at 0814, total withdrawal time 176m   COLONOSCOPY WITH PROPOFOL N/A 02/05/2018   Procedure: COLONOSCOPY WITH PROPOFOL;  Surgeon: ReRogene HoustonMD;  [facility];  Service: Endoscopy;  Laterality: N/A;  1:25   CORONARY ANGIOPLASTY WITH STENT PLACEMENT     CYSTOSCOPY W/ URETERAL STENT PLACEMENT Right [place]10/2019   [place] 12/22/2019   Procedure: CYSTOSCOPY WITH RIGHT URETERAL STENT REMOVAL; RIGHT RETROGRADE PYELOGRAM,RIGHT URETEROSCOPY;  Surgeon: McCleon GustinMD;  [facility];  Service: Urology;  Laterality: Right;   ESOPHAGEAL DILATION N/A 06/07/2015   Procedure: ESOPHAGEAL DILATION;  Surgeon: NaRogene HoustonMD;  [facility];  Service:  Endoscopy;  Laterality: N/A;   ESOPHAGOGASTRODUODENOSCOPY N/A 06/07/2015   Procedure: ESOPHAGOGASTRODUODENOSCOPY (EGD);  Surgeon: Rogene Houston, MD;  [facility];  Service: Endoscopy;  Laterality: N/A;  2:45 - moved to 1:00 - Ann to notify pt   ESOPHAGOGASTRODUODENOSCOPY (EGD) WITH PROPOFOL N/A 12/31/2012   Procedure: ESOPHAGOGASTRODUODENOSCOPY (EGD) WITH PROPOFOL;  Surgeon: Rogene Houston, MD;  [facility];  Service: Endoscopy;  Laterality: N/A;  GE junction 37,   ESOPHAGOGASTRODUODENOSCOPY (EGD) WITH PROPOFOL N/A 07/01/2013   Procedure: ESOPHAGOGASTRODUODENOSCOPY (EGD) WITH PROPOFOL;  Surgeon: Rogene Houston, MD;  [facility];  Service: Endoscopy;  Laterality: N/A;  [place] N/A 12/31/2012   Procedure: MALONEY DILATION;  Surgeon: Rogene Houston, MD;  [facility];  Service: Endoscopy;  Laterality: N/A;  used a # 54,#56   MALONEY DILATION N/A 07/01/2013   Procedure: MALONEY DILATION;  Surgeon: Rogene Houston, MD;  [facility];  Service: Endoscopy;  Laterality: N/A;  54/58; no heme present   POLYPECTOMY  02/05/2018    Procedure: POLYPECTOMY;  Surgeon: Rogene Houston, MD;  [facility];  Service: Endoscopy;;  distal rectum (HS)   STONE EXTRACTION WITH BASKET Right 12/22/2019   Procedure: STONE EXTRACTION WITH BASKET;  Surgeon: Cleon Gustin, MD;  [facility];  Service: Urology;  Laterality: Right;   Past Medical History:  Diagnosis Date   Anxiety and depression    Arthritis    CAD (coronary artery disease)    Stent placement circumflex coronary 2007, catheterization 2008 patent stents. 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    High cholesterol    Hx of blood clots    Hypertension    MI (myocardial infarction) ([place])    2007   Obstructive sleep apnea    CPAP, setting ?2   Palpitations    Sleep apnea    Tachycardia    BP 109/69   Pulse 73   SpO2 98%   Opioid Risk Score:   Fall Risk Score:  `1  Depression screen PHQ 2/9  Depression screen [facility] 2/9 10/11/2020 01/24/2020 11/18/2019 10/26/2019 09/26/2019 03/24/2019 07/16/2018  Decreased Interest 1 0 1 0 0 1 1  Down, Depressed, Hopeless - 0 1 0 0 1 1  PHQ - 2 Score 1 0 2 0 0 2 2  Altered sleeping - - - - - - -  Tired, decreased energy - - - - - - -  Change in appetite - - - - - - -  Feeling bad or failure about yourself  - - - - - - -  Trouble concentrating - - - - - - -  Moving slowly or fidgety/restless - - - - - - -  Suicidal thoughts - - - - - - -  PHQ-9 Score - - - - - - -  Difficult doing work/chores - - - - - - -  Some recent data might be hidden     Review of Systems  Musculoskeletal:  Positive for back  pain and gait problem.  Neurological:  Positive for  weakness.  All other systems reviewed and are negative.     Objective:   Physical Exam Vitals and nursing note reviewed.  Constitutional:      Appearance: Normal appearance.  Cardiovascular:     Rate and Rhythm: Normal rate and regular rhythm.     Pulses: Normal pulses.     Heart sounds: Normal heart sounds.  Pulmonary:     Effort: Pulmonary effort is normal.     Breath sounds: Normal breath sounds.     Comments: Continuous Oxygen @ 2 liters nasal cannula Musculoskeletal:     Cervical back: Normal range of motion and neck supple.     Comments: Normal Muscle Bulk and Muscle Testing Reveals:  Upper Extremities: Full ROM and Muscle Strength 4/5 Bilateral AC Joint Tenderness Thoracic and  Lumbar Hypersensitivity Right Greater Trochanter Tenderness Lower Extremities: Decreased ROM and Muscle Strength 4/5 Bilateral Lower Extremities Flexion Produces Pain into her Lumbar and lower extremities Arrived in wheelchair      Skin:    General: Skin is warm and dry.  Neurological:     Mental Status: She is alert and oriented to person, place, and time.  Psychiatric:        Mood and Affect: Mood normal.        Behavior: Behavior normal.         Assessment & Plan:  1.Lumbar pain lumbar spondylosis:  Encouraged to continue to increase Activity as tolerated. 02/01/2021 Continue current medication regimen.  Refilled: MS Contin 30 MG one tablet every 12 hours #60. Boyfriend Joe is dispensing Ms. Vilar Medication she reports. We will continue the opioid monitoring program, this consists of regular clinic visits, examinations, urine drug screen, pill counts as well as use of [place] Controlled Substance Reporting system. A 12 month History has been reviewed on the [place] on 02/01/2021.  2. Lumbar Radiculitis: Continue current medication regimen with Gabapentin. 02/01/2021 3. Bilateral Knee Pain/ Degenerative: Continue Voltaren Gel.  Continue to Monitor. 02/01/2021 4. Severe diabetic poly neuropathy: Continue current medication regimen with Gabapentin. 02/01/2021 5. Insomnia: Continue Armodafinil Psychiatry Following. 02/01/2021 6.Anxiety/Depression: Continue Current Medication regimen as prescribed by PCP and   Psychiatry. 02/01/2021 7. [place] Tenosynovitis: No Complaints Today. 02/01/2021 8. Bilateral  Greater Trochanteric Bursitis: Continue with Ice and Heat Therapy. Continue to Monitor. 02/01/2021.  9. Cervicalgia/ Cervical Radiculitis:Continue Gabapentin. Continue with HEP as Tolerated and continue to monitor. 02/01/2021 10. Chronic Bilateral Thoracic Back Pain: Continue HEP as Tolerated. Continue current medication regimen. Continue to monitor. 02/01/2021.   F/U in 1 month

## 2021-02-04 ENCOUNTER — Ambulatory Visit: Payer: Medicare Other | Admitting: Registered Nurse

## 2021-02-05 ENCOUNTER — Encounter: Payer: Self-pay | Admitting: Registered Nurse

## 2021-02-12 ENCOUNTER — Other Ambulatory Visit: Payer: Self-pay | Admitting: "Endocrinology

## 2021-02-12 ENCOUNTER — Telehealth: Payer: Self-pay | Admitting: "Endocrinology

## 2021-02-12 LAB — TOXASSURE SELECT,+ANTIDEPR,UR

## 2021-02-12 MED ORDER — TOUJEO MAX SOLOSTAR 300 UNIT/ML ~~LOC~~ SOPN: 100.0000 [IU] | PEN_INJECTOR | Freq: Every day | SUBCUTANEOUS | 2 refills | Status: DC

## 2021-02-12 NOTE — Telephone Encounter (Signed)
Tried to call pt, did not receive an answer and was unable to leave a message.

## 2021-02-12 NOTE — Telephone Encounter (Signed)
Patient states that her insurance will no longer her [place]. Can you switch it to something else?

## 2021-02-13 ENCOUNTER — Telehealth: Payer: Self-pay | Admitting: *Deleted

## 2021-02-13 NOTE — Telephone Encounter (Signed)
Urine drug screen for this encounter is consistent for prescribed medication 

## 2021-02-13 NOTE — Telephone Encounter (Signed)
Discussed with pt, she stated she was able to get her tresiba until the end of the year but her insurance starting in January will no longer cover it. Pt made aware Dr.Nida sent in a Rx for toujeo to replace the tresiba. Understanding voiced.

## 2021-02-27 ENCOUNTER — Ambulatory Visit: Payer: Medicare Other | Admitting: "Endocrinology

## 2021-03-14 ENCOUNTER — Other Ambulatory Visit: Payer: Self-pay

## 2021-03-14 ENCOUNTER — Encounter: Payer: 59 | Attending: Registered Nurse | Admitting: Registered Nurse

## 2021-03-14 DIAGNOSIS — M48062 Spinal stenosis, lumbar region with neurogenic claudication: Secondary | ICD-10-CM

## 2021-03-14 DIAGNOSIS — M1711 Unilateral primary osteoarthritis, right knee: Secondary | ICD-10-CM | POA: Insufficient documentation

## 2021-03-14 DIAGNOSIS — M542 Cervicalgia: Secondary | ICD-10-CM | POA: Diagnosis not present

## 2021-03-14 DIAGNOSIS — M7062 Trochanteric bursitis, left hip: Secondary | ICD-10-CM | POA: Insufficient documentation

## 2021-03-14 DIAGNOSIS — Z5181 Encounter for therapeutic drug level monitoring: Secondary | ICD-10-CM

## 2021-03-14 DIAGNOSIS — G894 Chronic pain syndrome: Secondary | ICD-10-CM

## 2021-03-14 DIAGNOSIS — M5412 Radiculopathy, cervical region: Secondary | ICD-10-CM | POA: Diagnosis not present

## 2021-03-14 DIAGNOSIS — M546 Pain in thoracic spine: Secondary | ICD-10-CM | POA: Insufficient documentation

## 2021-03-14 DIAGNOSIS — M7061 Trochanteric bursitis, right hip: Secondary | ICD-10-CM | POA: Insufficient documentation

## 2021-03-14 DIAGNOSIS — Z79891 Long term (current) use of opiate analgesic: Secondary | ICD-10-CM | POA: Insufficient documentation

## 2021-03-14 DIAGNOSIS — E1140 Type 2 diabetes mellitus with diabetic neuropathy, unspecified: Secondary | ICD-10-CM | POA: Insufficient documentation

## 2021-03-14 DIAGNOSIS — M1712 Unilateral primary osteoarthritis, left knee: Secondary | ICD-10-CM

## 2021-03-14 DIAGNOSIS — M5416 Radiculopathy, lumbar region: Secondary | ICD-10-CM

## 2021-03-14 MED ORDER — MORPHINE SULFATE ER 30 MG PO TBCR: 30.0000 mg | EXTENDED_RELEASE_TABLET | Freq: Two times a day (BID) | ORAL | 0 refills | Status: DC

## 2021-03-14 NOTE — Progress Notes (Signed)
Subjective:    Patient ID: [name], female    DOB: 14-Mar-1951, 70 y.o.   MRN: [id number]  HPI: [name] is a 70 y.o. female whose appointment was changed to a My- Chart Video Visit, due to lack of transportation. [name] and I  discussed the limitations of evaluation and management by telemedicine and the availability of in person appointments. The patient expressed understanding and agreed to proceed with My-Chart Video Visit. She  states her pain is located in her neck radiating into her bilateral shoulders, mid- lower back pain radiating into her bilateral hips and bilateral lower extremities. Also reports bilateral feet with tingling and burning. She rates her pain 9. Her current exercise regime is walking in her home.   [name] Morphine equivalent is 60.00 MME. She is also prescribed Alprazolam  by Dr. Woody Seller .We have discussed the black box warning of using opioids and benzodiazepines. I highlighted the dangers of using these drugs together and discussed the adverse events including respiratory suppression, overdose, cognitive impairment and importance of compliance with current regimen. We will continue to monitor and adjust as indicated.    Last UDS was Performed on 02/01/2021, it was consistent.     Pain Inventory Average Pain 8 Pain Right Now 9 My pain is aching  In the last 24 hours, has pain interfered with the following? General activity 10 Relation with others 10 Enjoyment of life 0 What TIME of day is your pain at its worst? morning  and night Sleep (in general) Poor  Pain is worse with: walking, bending, sitting, standing, and some activites Pain improves with: rest and medication Relief from Meds: 6  Family History  Problem Relation Age of Onset   Heart attack Mother    Colon cancer Mother    Heart attack Father    Stroke Sister    Hemophilia Child    Cancer Other    Coronary artery disease Other    Cancer Brother    Cancer Maternal Aunt     Cancer Maternal Uncle    Cancer Paternal Aunt    Cancer Paternal Uncle    Cancer Brother    Social History   Socioeconomic History   Marital status: Divorced    Spouse name: Not on file   Number of children: 4   Years of education: Not on file   Highest education level: Not on file  Occupational History   Occupation: Disabled  Tobacco Use   Smoking status: Never   Smokeless tobacco: Never  Vaping Use   Vaping Use: Never used  Substance and Sexual Activity   Alcohol use: No    Alcohol/week: 0.0 standard drinks   Drug use: No   Sexual activity: Not on file  Other Topics Concern   Not on file  Social History Narrative   Patient does not get regular exercise   Denies caffeine use    Social Determinants of Health   Financial Resource Strain: Not on file  Food Insecurity: Not on file  Transportation Needs: Not on file  Physical Activity: Not on file  Stress: Not on file  Social Connections: Not on file   Past Surgical History:  Procedure Laterality Date   ABDOMINAL HYSTERECTOMY     Arm Surgery Bilateral    due to fall-pt broke both forearms   BIOPSY N/A 07/01/2013   Procedure: BIOPSY;  Surgeon: Rogene Houston, MD;  [facility]: [facility];  Service: Endoscopy;  Laterality: N/A;   CHOLECYSTECTOMY  COLONOSCOPY  08/06/2011   Procedure: COLONOSCOPY;  Surgeon: Rogene Houston, MD;  [facility];  Service: Endoscopy;  Laterality: N/A;  215   COLONOSCOPY WITH PROPOFOL N/A 12/31/2012   Procedure: COLONOSCOPY WITH PROPOFOL;  Surgeon: Rogene Houston, MD;  Location: [facility];  Service: Endoscopy;  Laterality: N/A;  in cecum at 0814, total withdrawal time 89min   COLONOSCOPY WITH PROPOFOL N/A 02/05/2018   Procedure: COLONOSCOPY WITH PROPOFOL;  Surgeon: Rogene Houston, MD;  [facility];  Service: Endoscopy;  Laterality: N/A;  1:25   CORONARY ANGIOPLASTY WITH STENT PLACEMENT     CYSTOSCOPY W/ URETERAL STENT PLACEMENT Right 11/10/2019   at [place], URETEROSCOPY AND STENT PLACEMENT Right 12/22/2019   Procedure: CYSTOSCOPY WITH RIGHT URETERAL STENT REMOVAL; RIGHT RETROGRADE PYELOGRAM,RIGHT URETEROSCOPY;  Surgeon: Cleon Gustin, MD;  Location: [facility];  Service: Urology;  Laterality: Right;   ESOPHAGEAL DILATION N/A 06/07/2015   Procedure: ESOPHAGEAL DILATION;  Surgeon: Rogene Houston, MD;  [facility];  Service: Endoscopy;  Laterality: N/A;   ESOPHAGOGASTRODUODENOSCOPY N/A 06/07/2015   Procedure: ESOPHAGOGASTRODUODENOSCOPY (EGD);  Surgeon: Rogene Houston, MD;  [facility];  Service: Endoscopy;  Laterality: N/A;  2:45 - moved to 1:00 - Ann to notify pt   ESOPHAGOGASTRODUODENOSCOPY (EGD) WITH PROPOFOL N/A 12/31/2012   Procedure: ESOPHAGOGASTRODUODENOSCOPY (EGD) WITH PROPOFOL;  Surgeon: Rogene Houston, MD;  [facility];  Service: Endoscopy;  Laterality: N/A;  GE junction 37,   ESOPHAGOGASTRODUODENOSCOPY (EGD) WITH PROPOFOL N/A 07/01/2013   Procedure: ESOPHAGOGASTRODUODENOSCOPY (EGD) WITH PROPOFOL;  Surgeon: Rogene Houston, MD;  [facility];  Service: Endoscopy;  Laterality: N/A;  [place] N/A 12/31/2012   Procedure: MALONEY DILATION;  Surgeon: Rogene Houston, MD;  [facility];  Service: Endoscopy;  Laterality: N/A;  used a # 54,#56   MALONEY DILATION N/A 07/01/2013   Procedure: MALONEY DILATION;  Surgeon: Rogene Houston, MD;  [facility]: [facility];  Service: Endoscopy;  Laterality: N/A;  54/58; no heme present   POLYPECTOMY  02/05/2018   Procedure: POLYPECTOMY;  Surgeon: Rogene Houston, MD;  [facility];  Service: Endoscopy;;  distal rectum (HS)   STONE EXTRACTION WITH BASKET Right 12/22/2019   Procedure: STONE EXTRACTION WITH BASKET;  Surgeon: Cleon Gustin, MD;  [facility]: [facility];  Service: Urology;  Laterality: Right;   Past Surgical History:  Procedure Laterality Date   ABDOMINAL HYSTERECTOMY     Arm Surgery Bilateral    due to fall-pt broke both  forearms   BIOPSY N/A 07/01/2013   Procedure: BIOPSY;  Surgeon: Rogene Houston, MD;  [facility];  Service: Endoscopy;  Laterality: N/A;   CHOLECYSTECTOMY     COLONOSCOPY  08/06/2011   Procedure: COLONOSCOPY;  Surgeon: Rogene Houston, MD;  [facility];  Service: Endoscopy;  Laterality: N/A;  215   COLONOSCOPY WITH PROPOFOL N/A 12/31/2012   Procedure: COLONOSCOPY WITH PROPOFOL;  Surgeon: Rogene Houston, MD;  [facility];  Service: Endoscopy;  Laterality: N/A;  in cecum at 0814, total withdrawal time 12min   COLONOSCOPY WITH PROPOFOL N/A 02/05/2018   Procedure: COLONOSCOPY WITH PROPOFOL;  Surgeon: Rogene Houston, MD;  [facility];  Service: Endoscopy;  Laterality: N/A;  1:25   CORONARY ANGIOPLASTY WITH STENT PLACEMENT     CYSTOSCOPY W/ URETERAL STENT PLACEMENT Right 11/10/2019   at [place]  RETROGRADE PYELOGRAM, URETEROSCOPY AND STENT PLACEMENT Right 12/22/2019   Procedure: CYSTOSCOPY WITH RIGHT URETERAL STENT REMOVAL; RIGHT RETROGRADE PYELOGRAM,RIGHT URETEROSCOPY;  Surgeon: Cleon Gustin, MD;  [facility];  Service: Urology;  Laterality: Right;   ESOPHAGEAL DILATION N/A 06/07/2015   Procedure: ESOPHAGEAL DILATION;  Surgeon: Rogene Houston, MD;  [facility];  Service: Endoscopy;  Laterality: N/A;   ESOPHAGOGASTRODUODENOSCOPY N/A 06/07/2015   Procedure: ESOPHAGOGASTRODUODENOSCOPY (EGD);  Surgeon: Rogene Houston, MD;  [facility];  Service: Endoscopy;  Laterality: N/A;  2:45 - moved to 1:00 - Ann to notify pt   ESOPHAGOGASTRODUODENOSCOPY (EGD) WITH PROPOFOL N/A 12/31/2012   Procedure: ESOPHAGOGASTRODUODENOSCOPY (EGD) WITH PROPOFOL;  Surgeon: Rogene Houston, MD;  [facility];  Service: Endoscopy;  Laterality: N/A;  GE junction 37,   ESOPHAGOGASTRODUODENOSCOPY (EGD) WITH PROPOFOL N/A 07/01/2013   Procedure: ESOPHAGOGASTRODUODENOSCOPY (EGD) WITH PROPOFOL;  Surgeon: Rogene Houston, MD;  [facility];   Service: Endoscopy;  Laterality: N/A;  [place] N/A 12/31/2012   Procedure: MALONEY DILATION;  Surgeon: Rogene Houston, MD;  [facility];  Service: Endoscopy;  Laterality: N/A;  used a # 54,#56   MALONEY DILATION N/A 07/01/2013   Procedure: MALONEY DILATION;  Surgeon: Rogene Houston, MD;  [facility];  Service: Endoscopy;  Laterality: N/A;  54/58; no heme present   POLYPECTOMY  02/05/2018   Procedure: POLYPECTOMY;  Surgeon: Rogene Houston, MD;  [facility];  Service: Endoscopy;;  distal rectum (HS)   STONE EXTRACTION WITH BASKET Right 12/22/2019   Procedure: STONE EXTRACTION WITH BASKET;  Surgeon: Cleon Gustin, MD;  Location: [facility];  Service: Urology;  Laterality: Right;   Past Medical History:  Diagnosis Date   Anxiety and depression    Arthritis    CAD (coronary artery disease)    Stent placement circumflex coronary 2007, catheterization 2008 patent stents. 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  Opioid Risk Score:   Fall Risk Score:  `1  Depression screen PHQ 2/9  Depression screen [facility] 2/9 10/11/2020 01/24/2020 11/18/2019 10/26/2019 09/26/2019 03/24/2019 07/16/2018  Decreased Interest 1 0 1 0 0 1 1  Down, Depressed, Hopeless - 0 1 0 0 1 1  PHQ - 2 Score 1 0 2 0 0 2 2  Altered sleeping - - - - - - -  Tired, decreased energy - - - - - - -   Change in appetite - - - - - - -  Feeling bad or failure about yourself  - - - - - - -  Trouble concentrating - - - - - - -  Moving slowly or  fidgety/restless - - - - - - -  Suicidal thoughts - - - - - - -  PHQ-9 Score - - - - - - -  Difficult doing work/chores - - - - - - -  Some recent data might be hidden     Review of Systems  Musculoskeletal:  Positive for back pain and gait problem.  Neurological:  Positive for weakness.  All other systems reviewed and are negative.     Objective:   Physical Exam Vitals and nursing note reviewed.  Musculoskeletal:     Comments: No Physical Exam Performed: My-Chart Video Visit.         Assessment & Plan:  1.Lumbar pain lumbar spondylosis:  Encouraged to continue to increase Activity as tolerated. 03/14/2021 Continue current medication regimen.  Refilled: MS Contin 30 MG one tablet every 12 hours #60. Boyfriend Joe is dispensing Ms. Tortora Medication she reports. We will continue the opioid monitoring program, this consists of regular clinic visits, examinations, urine drug screen, pill counts as well as use of [place] Controlled Substance Reporting system. A 12 month History has been reviewed on the [place] on 03/14/2021.  2. Lumbar Radiculitis: Continue current medication regimen with Gabapentin. 03/14/2021 3. Bilateral Knee Pain/ Degenerative: Continue Voltaren Gel. Continue to Monitor. 03/14/2021 4. Severe diabetic poly neuropathy: Continue current medication regimen with Gabapentin. 03/14/2021 5. Insomnia: Continue Armodafinil Psychiatry Following. 03/14/2021 6.Anxiety/Depression: Continue Current Medication regimen as prescribed by PCP and   Psychiatry. 03/14/2021 7. De Quervains Tenosynovitis: No Complaints Today. 03/14/2021 8. Bilateral  Greater Trochanteric Bursitis: Continue with Ice and Heat Therapy. Continue to Monitor. 03/14/2021.  9. Cervicalgia/ Cervical  Radiculitis:Continue Gabapentin. Continue with HEP as Tolerated and continue to monitor. 03/14/2021 10. Chronic Bilateral Thoracic Back Pain: Continue HEP as Tolerated. Continue current medication regimen. Continue to monitor. 03/14/2021.   F/U in 1 month  My Chart Video Visit Established Patient Location Of Patient: In Her Home Location of Provider: In The Office

## 2021-03-22 LAB — COMPREHENSIVE METABOLIC PANEL
ALT: 13 IU/L (ref 0–32)
AST: 19 IU/L (ref 0–40)
Albumin/Globulin Ratio: 1.2 (ref 1.2–2.2)
Albumin: 3.3 g/dL — ABNORMAL LOW (ref 3.8–4.8)
Alkaline Phosphatase: 99 IU/L (ref 44–121)
BUN/Creatinine Ratio: 15 (ref 12–28)
BUN: 10 mg/dL (ref 8–27)
Bilirubin Total: 0.4 mg/dL (ref 0.0–1.2)
CO2: 24 mmol/L (ref 20–29)
Calcium: 9.1 mg/dL (ref 8.7–10.3)
Chloride: 101 mmol/L (ref 96–106)
Creatinine, Ser: 0.68 mg/dL (ref 0.57–1.00)
Globulin, Total: 2.8 g/dL (ref 1.5–4.5)
Glucose: 229 mg/dL — ABNORMAL HIGH (ref 70–99)
Potassium: 5.4 mmol/L — ABNORMAL HIGH (ref 3.5–5.2)
Sodium: 140 mmol/L (ref 134–144)
Total Protein: 6.1 g/dL (ref 6.0–8.5)
eGFR: 95 mL/min/{1.73_m2} (ref >59)

## 2021-03-22 LAB — T4, FREE: Free T4: 1.3 ng/dL (ref 0.82–1.77)

## 2021-03-22 LAB — TSH: TSH: 2.17 u[IU]/mL (ref 0.450–4.500)

## 2021-03-24 ENCOUNTER — Encounter: Payer: Self-pay | Admitting: Registered Nurse

## 2021-04-02 ENCOUNTER — Ambulatory Visit: Payer: Medicaid Other | Admitting: "Endocrinology

## 2021-04-10 ENCOUNTER — Ambulatory Visit: Payer: Medicaid Other | Admitting: "Endocrinology

## 2021-04-16 ENCOUNTER — Encounter: Payer: 59 | Attending: Registered Nurse | Admitting: Registered Nurse

## 2021-04-16 ENCOUNTER — Other Ambulatory Visit: Payer: Self-pay

## 2021-04-16 ENCOUNTER — Encounter: Payer: Self-pay | Admitting: Registered Nurse

## 2021-04-16 VITALS — BP 129/78 | HR 86 | Ht 64.0 in | Wt 256.0 lb

## 2021-04-16 DIAGNOSIS — M5416 Radiculopathy, lumbar region: Secondary | ICD-10-CM | POA: Insufficient documentation

## 2021-04-16 DIAGNOSIS — M546 Pain in thoracic spine: Secondary | ICD-10-CM | POA: Insufficient documentation

## 2021-04-16 DIAGNOSIS — M1712 Unilateral primary osteoarthritis, left knee: Secondary | ICD-10-CM | POA: Insufficient documentation

## 2021-04-16 DIAGNOSIS — M48062 Spinal stenosis, lumbar region with neurogenic claudication: Secondary | ICD-10-CM | POA: Insufficient documentation

## 2021-04-16 DIAGNOSIS — M5412 Radiculopathy, cervical region: Secondary | ICD-10-CM | POA: Diagnosis present

## 2021-04-16 DIAGNOSIS — Z79891 Long term (current) use of opiate analgesic: Secondary | ICD-10-CM | POA: Insufficient documentation

## 2021-04-16 DIAGNOSIS — G894 Chronic pain syndrome: Secondary | ICD-10-CM | POA: Insufficient documentation

## 2021-04-16 DIAGNOSIS — M542 Cervicalgia: Secondary | ICD-10-CM | POA: Insufficient documentation

## 2021-04-16 DIAGNOSIS — E1140 Type 2 diabetes mellitus with diabetic neuropathy, unspecified: Secondary | ICD-10-CM | POA: Diagnosis present

## 2021-04-16 DIAGNOSIS — Z5181 Encounter for therapeutic drug level monitoring: Secondary | ICD-10-CM | POA: Diagnosis present

## 2021-04-16 DIAGNOSIS — L601 Onycholysis: Secondary | ICD-10-CM | POA: Insufficient documentation

## 2021-04-16 DIAGNOSIS — M1711 Unilateral primary osteoarthritis, right knee: Secondary | ICD-10-CM | POA: Insufficient documentation

## 2021-04-16 MED ORDER — MORPHINE SULFATE ER 30 MG PO TBCR: 30.0000 mg | EXTENDED_RELEASE_TABLET | Freq: Two times a day (BID) | ORAL | 0 refills | Status: DC

## 2021-04-16 NOTE — Progress Notes (Signed)
Subjective:    Patient ID: [name], female    DOB: 07-Aug-1950, 70 y.o.   MRN: [id number]  HPI: [name] is a 71 y.o. female who returns for follow up appointment for chronic pain and medication refill. She states her  pain is located in her mid- lower back radiating into her bilateral lower extremities and bilateral knee pain. She also reports tingling and burning into her bilateral feet. She  rates her pain 7. Her current exercise regime is walking and performing stretching exercises.  [name] states she had a fall in her home on 04/12/2021, she was walking to the bathroom and lost her balance and fell backwards. She hit head against the table. She went to [facility] ED, note was reviewed. She was educated on [employer], she verbalizes understanding.   [name] reports her left great toe nail is separating from her nail bed ( Onycholysis) for over a year, she was instructed to F/U with her Podiatrist. She verbalizes understanding.   [name] was discharged on Oxycodone, narcotic contract was reviewed, she states she used the Oxycodone x1. She will bring oxycodone at her next visit to be destroyed, per office policy, she verbalizes understanding.   [name] Morphine equivalent is 60.00 MME.   Last UDS was Performed on 02/01/2021, it was consistent.     Pain Inventory Average Pain 7 Pain Right Now 7 My pain is sharp, burning, dull, stabbing, tingling, and aching  In the last 24 hours, has pain interfered with the following? General activity 5 Relation with others 5 Enjoyment of life 7 What TIME of day is your pain at its worst? morning  and night Sleep (in general) Fair  Pain is worse with: walking, bending, sitting, inactivity, standing, and some activites Pain improves with: rest, pacing activities, medication, and injections Relief from Meds: 3  Family History  Problem Relation Age of Onset   Heart attack Mother    Colon cancer Mother    Heart  attack Father    Stroke Sister    Hemophilia Child    Cancer Other    Coronary artery disease Other    Cancer Brother    Cancer Maternal Aunt    Cancer Maternal Uncle    Cancer Paternal Aunt    Cancer Paternal Uncle    Cancer Brother    Social History   Socioeconomic History   Marital status: Divorced    Spouse name: Not on file   Number of children: 4   Years of education: Not on file   Highest education level: Not on file  Occupational History   Occupation: Disabled  Tobacco Use   Smoking status: Never   Smokeless tobacco: Never  Vaping Use   Vaping Use: Never used  Substance and Sexual Activity   Alcohol use: No    Alcohol/week: 0.0 standard drinks   Drug use: No   Sexual activity: Not on file  Other Topics Concern   Not on file  Social History Narrative   Patient does not get regular exercise   Denies caffeine use    Social Determinants of Health   Financial Resource Strain: Not on file  Food Insecurity: Not on file  Transportation Needs: Not on file  Physical Activity: Not on file  Stress: Not on file  Social Connections: Not on file   Past Surgical History:  Procedure Laterality Date   ABDOMINAL HYSTERECTOMY     Arm Surgery Bilateral  due to fall-pt broke both forearms   BIOPSY N/A 07/01/2013   Procedure: BIOPSY;  Surgeon: Rogene Houston, MD;  Location: [facility];  Service: Endoscopy;  Laterality: N/A;   CHOLECYSTECTOMY     COLONOSCOPY  08/06/2011   Procedure: COLONOSCOPY;  Surgeon: Rogene Houston, MD;  [facility];  Service: Endoscopy;  Laterality: N/A;  215   COLONOSCOPY WITH PROPOFOL N/A 12/31/2012   Procedure: COLONOSCOPY WITH PROPOFOL;  Surgeon: Rogene Houston, MD;  Location: [facility];  Service: Endoscopy;  Laterality: N/A;  in cecum at 0814, total withdrawal time 32min   COLONOSCOPY WITH PROPOFOL N/A 02/05/2018   Procedure: COLONOSCOPY WITH PROPOFOL;  Surgeon: Rogene Houston, MD;  [facility];  Service: Endoscopy;   Laterality: N/A;  1:25   CORONARY ANGIOPLASTY WITH STENT PLACEMENT     CYSTOSCOPY W/ URETERAL STENT PLACEMENT Right 11/10/2019   at [place], URETEROSCOPY AND STENT PLACEMENT Right 12/22/2019   Procedure: CYSTOSCOPY WITH RIGHT URETERAL STENT REMOVAL; RIGHT RETROGRADE PYELOGRAM,RIGHT URETEROSCOPY;  Surgeon: Cleon Gustin, MD;  Location: [facility];  Service: Urology;  Laterality: Right;   ESOPHAGEAL DILATION N/A 06/07/2015   Procedure: ESOPHAGEAL DILATION;  Surgeon: Rogene Houston, MD;  [facility];  Service: Endoscopy;  Laterality: N/A;   ESOPHAGOGASTRODUODENOSCOPY N/A 06/07/2015   Procedure: ESOPHAGOGASTRODUODENOSCOPY (EGD);  Surgeon: Rogene Houston, MD;  [facility];  Service: Endoscopy;  Laterality: N/A;  2:45 - moved to 1:00 - Ann to notify pt   ESOPHAGOGASTRODUODENOSCOPY (EGD) WITH PROPOFOL N/A 12/31/2012   Procedure: ESOPHAGOGASTRODUODENOSCOPY (EGD) WITH PROPOFOL;  Surgeon: Rogene Houston, MD;  [facility];  Service: Endoscopy;  Laterality: N/A;  GE junction 37,   ESOPHAGOGASTRODUODENOSCOPY (EGD) WITH PROPOFOL N/A 07/01/2013   Procedure: ESOPHAGOGASTRODUODENOSCOPY (EGD) WITH PROPOFOL;  Surgeon: Rogene Houston, MD;  [facility];  Service: Endoscopy;  Laterality: N/A;  [place] N/A 12/31/2012   Procedure: MALONEY DILATION;  Surgeon: Rogene Houston, MD;  [facility];  Service: Endoscopy;  Laterality: N/A;  used a # 54,#56   MALONEY DILATION N/A 07/01/2013   Procedure: MALONEY DILATION;  Surgeon: Rogene Houston, MD;  [facility];  Service: Endoscopy;  Laterality: N/A;  54/58; no heme present   POLYPECTOMY  02/05/2018   Procedure: POLYPECTOMY;  Surgeon: Rogene Houston, MD;  [facility];  Service: Endoscopy;;  distal rectum (HS)   STONE EXTRACTION WITH BASKET Right 12/22/2019   Procedure: STONE EXTRACTION WITH BASKET;  Surgeon: Cleon Gustin, MD;  [facility];  Service:  Urology;  Laterality: Right;   Past Surgical History:  Procedure Laterality Date   ABDOMINAL HYSTERECTOMY     Arm Surgery Bilateral    due to fall-pt broke both forearms   BIOPSY N/A 07/01/2013   Procedure: BIOPSY;  Surgeon: Rogene Houston, MD;  [facility];  Service: Endoscopy;  Laterality: N/A;   CHOLECYSTECTOMY     COLONOSCOPY  08/06/2011   Procedure: COLONOSCOPY;  Surgeon: Rogene Houston, MD;  [facility];  Service: Endoscopy;  Laterality: N/A;  215   COLONOSCOPY WITH PROPOFOL N/A 12/31/2012   Procedure: COLONOSCOPY WITH PROPOFOL;  Surgeon: Rogene Houston, MD;  [facility];  Service: Endoscopy;  Laterality: N/A;  in cecum at 0814, total withdrawal time 70min   COLONOSCOPY WITH PROPOFOL N/A 02/05/2018   Procedure: COLONOSCOPY WITH PROPOFOL;  Surgeon: Rogene Houston, MD;  [facility];  Service: Endoscopy;  Laterality: N/A;  1:25   CORONARY ANGIOPLASTY WITH STENT PLACEMENT     CYSTOSCOPY W/ URETERAL STENT PLACEMENT Right 11/10/2019   at [place], URETEROSCOPY AND STENT PLACEMENT Right 12/22/2019   Procedure: CYSTOSCOPY WITH RIGHT URETERAL STENT REMOVAL; RIGHT RETROGRADE PYELOGRAM,RIGHT URETEROSCOPY;  Surgeon: Cleon Gustin, MD;  [facility];  Service: Urology;  Laterality: Right;   ESOPHAGEAL DILATION N/A 06/07/2015   Procedure: ESOPHAGEAL DILATION;  Surgeon: Rogene Houston, MD;  [facility];  Service: Endoscopy;  Laterality: N/A;   ESOPHAGOGASTRODUODENOSCOPY N/A 06/07/2015   Procedure: ESOPHAGOGASTRODUODENOSCOPY (EGD);  Surgeon: Rogene Houston, MD;  [facility];  Service: Endoscopy;  Laterality: N/A;  2:45 - moved to 1:00 - Ann to notify pt   ESOPHAGOGASTRODUODENOSCOPY (EGD) WITH PROPOFOL N/A 12/31/2012   Procedure: ESOPHAGOGASTRODUODENOSCOPY (EGD) WITH PROPOFOL;  Surgeon: Rogene Houston, MD;  [facility];  Service: Endoscopy;  Laterality: N/A;  GE junction 37,    ESOPHAGOGASTRODUODENOSCOPY (EGD) WITH PROPOFOL N/A 07/01/2013   Procedure: ESOPHAGOGASTRODUODENOSCOPY (EGD) WITH PROPOFOL;  Surgeon: Rogene Houston, MD;  [facility];  Service: Endoscopy;  Laterality: N/A;  [place] N/A 12/31/2012   Procedure: MALONEY DILATION;  Surgeon: Rogene Houston, MD;  [facility];  Service: Endoscopy;  Laterality: N/A;  used a # 54,#56   MALONEY DILATION N/A 07/01/2013   Procedure: MALONEY DILATION;  Surgeon: Rogene Houston, MD;  [facility];  Service: Endoscopy;  Laterality: N/A;  54/58; no heme present   POLYPECTOMY  02/05/2018   Procedure: POLYPECTOMY;  Surgeon: Rogene Houston, MD;  [facility];  Service: Endoscopy;;  distal rectum (HS)   STONE EXTRACTION WITH BASKET Right 12/22/2019   Procedure: STONE EXTRACTION WITH BASKET;  Surgeon: Cleon Gustin, MD;  Location: [facility];  Service: Urology;  Laterality: Right;   Past Medical History:  Diagnosis Date   Anxiety and depression    Arthritis    CAD (coronary artery disease)    Stent placement circumflex coronary 2007, catheterization 2008 patent stents. 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    High cholesterol    Hx of blood clots    Hypertension    MI (myocardial infarction) ([place])    2007   Obstructive sleep apnea    CPAP, setting ?2   Palpitations    Sleep apnea    Tachycardia    BP 129/78    Pulse 86    Ht 5\' 4"  (1.626 m)    Wt 256 lb (116.1 kg)    SpO2 91%    BMI 43.94 kg/m   Opioid Risk Score:   Fall Risk Score:  `1  Depression screen PHQ 2/9  Depression screen [facility] 2/9 10/11/2020 01/24/2020  11/18/2019 10/26/2019 09/26/2019 03/24/2019 07/16/2018  Decreased Interest 1 0 1 0 0 1 1  Down, Depressed, Hopeless - 0 1 0 0 1 1  PHQ - 2 Score 1 0 2  0 0 2 2  Altered sleeping - - - - - - -  Tired, decreased energy - - - - - - -  Change in appetite - - - - - - -  Feeling bad or failure about yourself  - - - - - - -  Trouble concentrating - - - - - - -  Moving slowly or fidgety/restless - - - - - - -  Suicidal thoughts - - - - - - -  PHQ-9 Score - - - - - - -  Difficult doing work/chores - - - - - - -  Some recent data might be hidden      Review of Systems  Musculoskeletal:  Positive for back pain.       Pain down the back of legs Bilateral knee pain  All other systems reviewed and are negative.     Objective:   Physical Exam Vitals and nursing note reviewed.  Constitutional:      Appearance: Normal appearance.  Cardiovascular:     Rate and Rhythm: Normal rate and regular rhythm.     Pulses: Normal pulses.     Heart sounds: Normal heart sounds.  Pulmonary:     Effort: Pulmonary effort is normal.     Breath sounds: Normal breath sounds.  Musculoskeletal:     Cervical back: Normal range of motion and neck supple.     Comments: Normal Muscle Bulk and Muscle Testing Reveals:  Upper Extremities: Full ROM and Muscle Strength 5/5 Bilateral AC Joint Tenderness Thoracic and Lumbar Hypersensitivity Bilateral Greater Trochanter Tenderness Lower Extremities:Full ROM and Muscle Strength 5/5  Bilateral Lower Extremities Flexion Produces Pain into her Bilateral Lower Extremities and Bilateral Feet Arises from Table Slowly, using walker for support Narrow Based Gait        Skin:    General: Skin is warm and dry.  Neurological:     Mental Status: She is alert and oriented to person, place, and time.  Psychiatric:        Mood and Affect: Mood normal.        Behavior: Behavior normal.         Assessment & Plan:  1.Lumbar pain lumbar spondylosis:  Encouraged to continue to increase  Activity as tolerated. 04/16/2021 Continue current medication regimen.  Refilled: MS Contin 30 MG one tablet every 12 hours #60. Boyfriend Joe is dispensing Ms. Pinney Medication she reports. We will continue the opioid monitoring program, this consists of regular clinic visits, examinations, urine drug screen, pill counts as well as use of [place] Controlled Substance Reporting system. A 12 month History has been reviewed on the [facility] on 04/16/2021.  2. Lumbar Radiculitis: Continue current medication regimen with Gabapentin. 04/16/2021 3. Bilateral Knee Pain/ Degenerative: Continue Voltaren Gel. Continue to Monitor. 04/16/2021 4. Severe diabetic poly neuropathy: Continue current medication regimen with Gabapentin. 04/16/2021 5. Insomnia: Continue Armodafinil Psychiatry Following. 04/16/2021 6.Anxiety/Depression: Continue Current Medication regimen as prescribed by PCP and   Psychiatry. 04/16/2021 7. De Quervains Tenosynovitis: No Complaints Today. 04/16/2021 8. Bilateral  Greater Trochanteric Bursitis: Continue with Ice and Heat Therapy. Continue to Monitor. 04/16/2021.  9. Cervicalgia/ Cervical Radiculitis:Continue Gabapentin. Continue with HEP as Tolerated and continue to monitor. 04/16/2021 10. Chronic Bilateral Thoracic Back Pain: Continue HEP as Tolerated. Continue current medication regimen. Continue to monitor. 04/16/2021.  11. Onycholysis: Left Great Toe: She will scheduled an appointment with her Podiatrist, she states.  F/U in 1 month

## 2021-04-18 ENCOUNTER — Other Ambulatory Visit: Payer: Self-pay

## 2021-04-18 ENCOUNTER — Encounter: Payer: Self-pay | Admitting: "Endocrinology

## 2021-04-18 ENCOUNTER — Ambulatory Visit (INDEPENDENT_AMBULATORY_CARE_PROVIDER_SITE_OTHER): Payer: 59 | Admitting: "Endocrinology

## 2021-04-18 VITALS — BP 158/86 | HR 88 | Ht 64.0 in | Wt 256.0 lb

## 2021-04-18 DIAGNOSIS — E782 Mixed hyperlipidemia: Secondary | ICD-10-CM | POA: Diagnosis not present

## 2021-04-18 DIAGNOSIS — I10 Essential (primary) hypertension: Secondary | ICD-10-CM | POA: Diagnosis not present

## 2021-04-18 DIAGNOSIS — E1159 Type 2 diabetes mellitus with other circulatory complications: Secondary | ICD-10-CM

## 2021-04-18 LAB — POCT GLYCOSYLATED HEMOGLOBIN (HGB A1C): HbA1c, POC (controlled diabetic range): 9.1 % — AB (ref 0.0–7.0)

## 2021-04-18 MED ORDER — INSULIN LISPRO PROT & LISPRO (75-25 MIX) 100 UNIT/ML KWIKPEN: 50.0000 [IU] | PEN_INJECTOR | Freq: Two times a day (BID) | SUBCUTANEOUS | 2 refills | Status: DC

## 2021-04-18 NOTE — Patient Instructions (Signed)

## 2021-04-18 NOTE — Progress Notes (Signed)
04/18/2021   Endocrinology follow-up note   Subjective:    Patient ID: [name], female    DOB: 09-15-1950. Patient is returning with incomplete glycemic profile, still significantly above target.  She still has not used her rapid acting insulin correctly.  -She is on follow-up for currently uncontrolled, complicated type 2 diabetes; hyperlipidemia; hypertension.  Past Medical History:  Diagnosis Date   Anxiety and depression    Arthritis    CAD (coronary artery disease)    Stent placement circumflex coronary 2007, catheterization 2008 patent stents. 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    High cholesterol    Hx of blood clots    Hypertension    MI (myocardial infarction) ([place])    2007   Obstructive sleep apnea    CPAP, setting ?2   Palpitations    Sleep apnea    Tachycardia    Past Surgical History:  Procedure Laterality Date   ABDOMINAL HYSTERECTOMY     Arm Surgery Bilateral    due to fall-pt broke both forearms   BIOPSY N/A 07/01/2013   Procedure: BIOPSY;  Surgeon: Rogene Houston, MD;  Location: [facility];  Service: Endoscopy;  Laterality: N/A;   CHOLECYSTECTOMY     COLONOSCOPY  08/06/2011   Procedure: COLONOSCOPY;  Surgeon: Rogene Houston, MD;  [facility];  Service: Endoscopy;  Laterality: N/A;  215   COLONOSCOPY WITH PROPOFOL N/A 12/31/2012   Procedure: COLONOSCOPY WITH PROPOFOL;  Surgeon: Rogene Houston, MD;  Location: [facility];  Service: Endoscopy;  Laterality: N/A;  in cecum at 0814, total withdrawal time 68mn   COLONOSCOPY WITH PROPOFOL N/A 02/05/2018   Procedure: COLONOSCOPY WITH  PROPOFOL;  Surgeon: RRogene Houston MD;  [facility];  Service: Endoscopy;  Laterality: N/A;  1:25   CORONARY ANGIOPLASTY WITH STENT PLACEMENT     CYSTOSCOPY W/ URETERAL STENT PLACEMENT Right 11/10/2019   [place] URETEROSCOPY AND STENT PLACEMENT Right 12/22/2019   Procedure: CYSTOSCOPY WITH RIGHT URETERAL STENT REMOVAL; RIGHT RETROGRADE PYELOGRAM,RIGHT URETEROSCOPY;  Surgeon: MCleon Gustin MD;  Location: [facility];  Service: Urology;  Laterality: Right;   ESOPHAGEAL DILATION N/A 06/07/2015   Procedure: ESOPHAGEAL DILATION;  Surgeon: NRogene Houston MD;  [facility];  Service: Endoscopy;  Laterality: N/A;   ESOPHAGOGASTRODUODENOSCOPY N/A 06/07/2015   Procedure: ESOPHAGOGASTRODUODENOSCOPY (EGD);  Surgeon: NRogene Houston MD;  [facility];  Service: Endoscopy;  Laterality: N/A;  2:45 - moved to 1:00 - Ann to notify pt   ESOPHAGOGASTRODUODENOSCOPY (EGD) WITH PROPOFOL N/A 12/31/2012   Procedure: ESOPHAGOGASTRODUODENOSCOPY (EGD) WITH PROPOFOL;  Surgeon: NRogene Houston MD;  [facility]: [facility];  Service: Endoscopy;  Laterality: N/A;  GE junction 37,   ESOPHAGOGASTRODUODENOSCOPY (EGD) WITH PROPOFOL N/A 07/01/2013   Procedure: ESOPHAGOGASTRODUODENOSCOPY (EGD) WITH PROPOFOL;  Surgeon: NRogene Houston MD;  Location[facility];  Service: Endoscopy;  Laterality: N/A;  [place] 12/[place]/2012   Procedure: MALONEY DILATION;  Surgeon:  Rogene Houston, MD;  Location: [facility];  Service: Endoscopy;  Laterality: N/A;  used a # 54,#56   MALONEY DILATION N/A 07/01/2013   Procedure: MALONEY DILATION;  Surgeon: Rogene Houston, MD;  Location: [facility];  Service: Endoscopy;  Laterality: N/A;  54/58; no heme present   POLYPECTOMY  02/05/2018   Procedure: POLYPECTOMY;  Surgeon: Rogene Houston, MD;  [facility];  Service: Endoscopy;;  distal rectum (HS)   STONE EXTRACTION WITH BASKET Right 12/22/2019   Procedure: STONE  EXTRACTION WITH BASKET;  Surgeon: Cleon Gustin, MD;  Location: [facility];  Service: Urology;  Laterality: Right;   Social History   Socioeconomic History   Marital status: Divorced    Spouse name: Not on file   Number of children: 4   Years of education: Not on file   Highest education level: Not on file  Occupational History   Occupation: Disabled  Tobacco Use   Smoking status: Never   Smokeless tobacco: Never  Vaping Use   Vaping Use: Never used  Substance and Sexual Activity   Alcohol use: No    Alcohol/week: 0.0 standard drinks   Drug use: No   Sexual activity: Not on file  Other Topics Concern   Not on file  Social History Narrative   Patient does not get regular exercise   Denies caffeine use    Social Determinants of Health   Financial Resource Strain: Not on file  Food Insecurity: Not on file  Transportation Needs: Not on file  Physical Activity: Not on file  Stress: Not on file  Social Connections: Not on file   Outpatient Encounter Medications as of 04/18/2021  Medication Sig   Insulin Lispro Prot & Lispro (HUMALOG MIX 75/25 KWIKPEN) (75-25) 100 UNIT/ML Kwikpen Inject 50 Units into the skin 2 (two) times daily before a meal.   ALPRAZolam (XANAX) 0.5 MG tablet Take 0.5 mg by mouth 2 (two) times daily as needed for anxiety or sleep.   Armodafinil 150 MG tablet Take 150 mg by mouth daily.   aspirin EC 81 MG tablet Take 1 tablet (81 mg total) by mouth daily.   atorvastatin (LIPITOR) 40 MG tablet Take 40 mg by mouth daily.   B-D ULTRAFINE III SHORT PEN 31G X 8 MM MISC Inject into the skin 4 (four) times daily.   blood glucose meter kit and supplies KIT 1 each by Does not apply route 2 (two) times daily. Dispense based on patient and insurance preference. Use bid as directed. E11.65   Blood Glucose Monitoring Suppl (ACCU-CHEK GUIDE ME) w/Device KIT Use to test BG qid. E11.65   Blood Glucose Monitoring Suppl (TRUE METRIX METER) DEVI USE AS DIRECTED TO TEST BLOOD  SUGAR TWICE DAILY   buPROPion (WELLBUTRIN SR) 100 MG 12 hr tablet Take 100 mg by mouth daily.   Cholecalciferol (VITAMIN D3) 125 MCG (5000 UT) CAPS TAKE ONE CAPSULE BY MOUTH DAILY (Patient taking differently: Take 5,000 Units by mouth daily.)   diclofenac Sodium (VOLTAREN) 1 % GEL APPLY TO BOTH KNEES THREE TIMES DAILY (Patient taking differently: Apply 2 g topically in the morning, at noon, and at bedtime.)   diltiazem (CARDIZEM CD) 240 MG 24 hr capsule Take 240 mg by mouth daily.   doxepin (SINEQUAN) 25 MG capsule Take 75 mg by mouth at bedtime.    estradiol (ESTRACE) 0.5 MG tablet Take 0.5 mg by mouth daily.    FEROSUL 325 (65 Fe) MG tablet Take 325 mg by  mouth every morning.   furosemide (LASIX) 40 MG tablet Take 1 tablet by mouth daily.   gabapentin (NEURONTIN) 400 MG capsule Take 400 mg by mouth 3 (three) times daily.   glucose blood (ACCU-CHEK GUIDE) test strip Use as instructed   lisinopril (ZESTRIL) 5 MG tablet Take 1 tablet (5 mg total) by mouth daily.   meloxicam (MOBIC) 7.5 MG tablet Take 7.5 mg by mouth in the morning and at bedtime.    metFORMIN (GLUCOPHAGE) 500 MG tablet TAKE 1 TABLET BY MOUTH TWICE DAILY (Patient taking differently: Take 500 mg by mouth 2 (two) times daily with a meal.)   methocarbamol (ROBAXIN) 500 MG tablet Take 500 mg by mouth 2 (two) times daily.   metoCLOPramide (REGLAN) 5 MG tablet Take 5 mg by mouth 3 (three) times daily as needed for nausea or vomiting.    metoprolol succinate (TOPROL-XL) 100 MG 24 hr tablet Take 100 mg by mouth daily. Take with or immediately following a meal.   montelukast (SINGULAIR) 10 MG tablet Take 10 mg by mouth at bedtime.    morphine (MS CONTIN) 30 MG 12 hr tablet Take 1 tablet (30 mg total) by mouth every 12 (twelve) hours.   nitroGLYCERIN (NITROSTAT) 0.4 MG SL tablet Place 1 tablet (0.4 mg total) under the tongue every 5 (five) minutes as needed for chest pain (up to 3 doses. If taking 3rd dose call 911).   ondansetron (ZOFRAN) 4  MG tablet Take 4 mg by mouth every other day.   oxyCODONE-acetaminophen (PERCOCET/ROXICET) 5-325 MG tablet Take 1 tablet by mouth every 6 (six) hours as needed.   pantoprazole (PROTONIX) 40 MG tablet TAKE 1 TABLET BY MOUTH TWICE DAILY BEFORE MEALS (Patient taking differently: Take 40 mg by mouth 2 (two) times daily before a meal.)   polyethylene glycol (MIRALAX / GLYCOLAX) 17 g packet Take 17 g by mouth daily as needed for moderate constipation.   potassium chloride (KLOR-CON) 10 MEQ tablet Take 10 mEq by mouth 2 (two) times daily.   PROAIR HFA 108 (90 Base) MCG/ACT inhaler Inhale 2 puffs into the lungs every 4 (four) hours as needed for wheezing or shortness of breath.    promethazine (PHENERGAN) 25 MG tablet Take 1 tablet (25 mg total) by mouth 4 (four) times daily as needed. (Patient taking differently: Take 25 mg by mouth 4 (four) times daily as needed for nausea or vomiting.)   SURE COMFORT INS SYR 1CC/30G 30G X 5/16" 1 ML MISC 1 each by Other route daily.   topiramate (TOPAMAX) 200 MG tablet Take 200 mg by mouth at bedtime.   [DISCONTINUED] insulin glargine, 2 Unit Dial, (TOUJEO MAX SOLOSTAR) 300 UNIT/ML Solostar Pen Inject 100 Units into the skin at bedtime.   [DISCONTINUED] insulin lispro (HUMALOG KWIKPEN) 200 UNIT/ML KwikPen Inject 14-20 Units into the skin 3 (three) times daily before meals. (Patient taking differently: Inject 16 Units into the skin 3 (three) times daily before meals.)   No facility-administered encounter medications on file as of 04/18/2021.   ALLERGIES: Allergies  Allergen Reactions   Amphetamine-Dextroamphetamine Swelling   Nitrofuran Derivatives Itching and Swelling   Amphetamine-Dextroamphet Er Swelling   Pregabalin Swelling   Topiramate Other (See Comments)    Tongue tingle   Verelan [Verapamil] Rash   VACCINATION STATUS:  There is no immunization history on file for this patient.  Diabetes She presents for her follow-up diabetic visit. She has type 2  diabetes mellitus. Onset time: She was diagnosed at approximate age of 65  years. Her disease course has been improving. There are no hypoglycemic associated symptoms. Pertinent negatives for hypoglycemia include no confusion, headaches, pallor or seizures. Pertinent negatives for diabetes include no chest pain, no fatigue, no polydipsia, no polyphagia and no polyuria. There are no hypoglycemic complications. Symptoms are improving. Diabetic complications include autonomic neuropathy, heart disease, peripheral neuropathy, PVD and retinopathy. Risk factors for coronary artery disease include dyslipidemia, diabetes mellitus, obesity, hypertension and sedentary lifestyle. Current diabetic treatments: Lantus 65 units daily at bedtime, Humalog 15 units 3 times a day before meals. She mentions allergy to Victoza, [place], and metformin. Her weight is increasing steadily. She is following a generally unhealthy diet. When asked about meal planning, she reported none. She has not had a previous visit with a dietitian. She never participates in exercise. Her home blood glucose trend is fluctuating minimally. Her breakfast blood glucose range is generally >200 mg/dl. Her bedtime blood glucose range is generally >200 mg/dl. Her overall blood glucose range is >200 mg/dl. (She presents with slight improvement in her glycemic profile and A1c of 9.1%, generally improving from 11.2% 2 visits ago.  She denies hypoglycemia.  However she struggles connecting the current basal/bolus insulin regimen.  She misses half of her prandial insulin injection opportunities.  ) An ACE inhibitor/angiotensin II receptor blocker is not being taken. Eye exam is current.  Hyperlipidemia This is a chronic problem. The current episode started more than 1 year ago. The problem is uncontrolled. Recent lipid tests were reviewed and are high. Exacerbating diseases include diabetes and obesity. Pertinent negatives include no chest pain, myalgias or  shortness of breath. Current antihyperlipidemic treatment includes statins. Risk factors for coronary artery disease include diabetes mellitus, dyslipidemia, hypertension, obesity and a sedentary lifestyle.  Hypertension This is a chronic problem. The current episode started more than 1 year ago. Pertinent negatives include no chest pain, headaches, palpitations or shortness of breath. Risk factors for coronary artery disease include diabetes mellitus, dyslipidemia, obesity and sedentary lifestyle. Hypertensive end-organ damage includes CAD/MI, PVD and retinopathy.   Review of systems: Limited as above.  Objective:    BP (!) 158/86    Pulse 88    Ht _0  (1.626 m)    Wt 256 lb (116.1 kg)    BMI 43.94 kg/m   Wt Readings from Last 3 Encounters:  04/18/21 256 lb (116.1 kg)  04/16/21 256 lb (116.1 kg)  11/27/20 258 lb (117 kg)     CMP     Component Value Date/Time   NA 140 03/21/2021 1139   K 5.4 (H) 03/21/2021 1139   CL 101 03/21/2021 1139   CO2 24 03/21/2021 1139   GLUCOSE 229 (H) 03/21/2021 1139   GLUCOSE 233 (H) 08/08/2020 0244   BUN 10 03/21/2021 1139   CREATININE 0.68 03/21/2021 1139   CREATININE 0.70 11/02/2018 1105   CALCIUM 9.1 03/21/2021 1139   PROT 6.1 03/21/2021 1139   ALBUMIN 3.3 (L) 03/21/2021 1139   AST 19 03/21/2021 1139   ALT 13 03/21/2021 1139   ALKPHOS 99 03/21/2021 1139   BILITOT 0.4 03/21/2021 1139   GFRNONAA >60 08/08/2020 0244   GFRNONAA 91 11/02/2018 1105   GFRAA >60 10/27/2019 1130   GFRAA 105 11/02/2018 1105   Diabetic Labs (most recent): Lab Results  Component Value Date   HGBA1C 9.1 (A) 04/18/2021   HGBA1C 10.1 (A) 11/27/2020   HGBA1C 11.2 (H) 08/08/2020    Lipid Panel     Component Value Date/Time   CHOL 115 08/08/2020  0244   CHOL 127 07/05/2020 1338   TRIG 118 08/08/2020 0244   HDL 37 (L) 08/08/2020 0244   HDL 46 07/05/2020 1338   CHOLHDL 3.1 08/08/2020 0244   VLDL 24 08/08/2020 0244   LDLCALC 54 08/08/2020 0244   LDLCALC 43  07/05/2020 1338   LDLCALC 90 07/06/2018 0936     Assessment & Plan:   1. Type 2 diabetes mellitus with vascular disease ([place]) - Patient has currently uncontrolled symptomatic type 2 DM since  71 years of age.  She presents with slight improvement in her glycemic profile and A1c of 9.1%, generally improving from 11.2% 2 visits ago.  She denies hypoglycemia.  However she struggles connecting the current basal/bolus insulin regimen.  She misses half of her prandial insulin injection opportunities.    Recent labs reviewed.  - Her diabetes is complicated by obesity/sedentary life, coronary artery disease status post stent placement and patient remains at a high risk for more acute and chronic complications of diabetes which include CAD, CVA, CKD, retinopathy, and neuropathy. These are all discussed in detail with the patient.  - I have counseled the patient on diet management and weight loss, by adopting a carbohydrate restricted/protein rich diet.  - she acknowledges that there is a room for improvement in her food and drink choices. - Suggestion is made for her to avoid simple carbohydrates  from her diet including Cakes, Sweet Desserts, Ice Cream, Soda (diet and regular), Sweet Tea, Candies, Chips, Cookies, Store Bought Juices, Alcohol , Artificial Sweeteners,  Coffee Creamer, and "Sugar-free" Products, Lemonade. This will help patient to have more stable blood glucose profile and potentially avoid unintended weight gain.  The following Lifestyle Medicine recommendations according to [facility]) were discussed and and offered to patient and she  agrees to start the journey:  A. Whole Foods, Plant-Based Nutrition comprising of fruits and vegetables, plant-based proteins, whole-grain carbohydrates was discussed in detail with the patient.   A list for source of those nutrients were also provided to the patient.  Patient will use only water or unsweetened tea for  hydration. B.  The need to stay away from risky substances including alcohol, smoking; obtaining 7 to 9 hours of restorative sleep, at least 150 minutes of moderate intensity exercise weekly, the importance of healthy social connections,  and stress management techniques were discussed.   - Patient is advised to stick to a routine mealtimes to eat 3 meals  a day and avoid unnecessary snacks ( to snack only to correct hypoglycemia).   - I have approached patient with the following individualized plan to manage diabetes and patient agrees:   -In light of her presentation with significantly above target postprandial glycemic profile, and that with the fact that she cannot continue her current basal/bolus insulin regimen, she is being considered for premixed insulin to use twice daily for simplicity reasons.   -I advised her to discontinue [place].  I discussed and initiated Humalog 75/25 50 units with breakfast and 50 units with supper only if glucose readings are above 90 and only if she is eating.    She is advised and encouraged to continue monitoring blood glucose 4 times a day-daily before 3 meals and at bedtime.   -She is warned not to take insulin without proper monitoring of blood glucose.  -Her insurance did not provide coverage for CGM device.  -She continues to have normal renal function, will continue to benefit from low-dose  metformin.  She is advised to continue metformin 500 mg p.o. twice daily.     -Patient is encouraged to call clinic for blood glucose levels less than 70 or above 200 mg /dl.  -She has intolerance to GLP-1 receptor agonists.  2) BP/HTN: -Her blood pressure is not controlled to target.  She has adequate medications, advised to continue including metoprolol XL 50 mg p.o. daily.  3) Lipids/HPL: She has uncontrolled hypertriglyceridemia , still high at 439  LDL better at 50, HDL at 77. She is advised to continue Lipitor 40 mg p.o. nightly.   She is  educated on the fact that   better control of diabetes will help for triglycerides.   4) Chronic Care/Health Maintenance:  -Patient is on Statin medications and encouraged to continue to follow up with Ophthalmology, Podiatrist at least yearly or according to recommendations, and advised to   stay away from smoking. I have recommended yearly flu vaccine and pneumonia vaccination at least every 5 years;  and  sleep for at least 7 hours a day. -She will benefit from a set of diabetic shoes, paperwork filled for her.   - I advised patient to maintain close follow up with Glenda Chroman, MD for primary care needs.  I spent 41 minutes in the care of the patient today including review of labs from [place], Lipids, Thyroid Function, Hematology (current and previous including abstractions from other facilities); face-to-face time discussing  her blood glucose readings/logs, discussing hypoglycemia and hyperglycemia episodes and symptoms, medications doses, her options of short and long term treatment based on the latest standards of care / guidelines;  discussion about incorporating lifestyle medicine;  and documenting the encounter.    Please refer to Patient Instructions for Blood Glucose Monitoring and Insulin/Medications Dosing Guide"  in media tab for additional information. Please  also refer to " Patient Self Inventory" in the Media  tab for reviewed elements of pertinent patient history.  [name] participated in the discussions, expressed understanding, and voiced agreement with the above plans.  All questions were answered to her satisfaction. she is encouraged to contact clinic should she have any questions or concerns prior to her return visit.   Follow up plan: - Patient to go to emergency room for better evaluation of chest pain. Return in about 3 months (around 07/16/2021) for Bring Meter and Logs- A1c in Office.  Glade Lloyd, MD Phone: [phone]  Fax: [phone]  This note was  partially dictated with voice recognition software. Similar sounding words can be transcribed inadequately or may not  be corrected upon review.  04/18/2021, 6:03 PM

## 2021-05-08 ENCOUNTER — Other Ambulatory Visit: Payer: Self-pay | Admitting: Cardiology

## 2021-05-13 ENCOUNTER — Encounter: Payer: 59 | Admitting: Registered Nurse

## 2021-05-14 ENCOUNTER — Other Ambulatory Visit: Payer: Self-pay | Admitting: "Endocrinology

## 2021-05-15 DIAGNOSIS — G4733 Obstructive sleep apnea (adult) (pediatric): Secondary | ICD-10-CM | POA: Diagnosis not present

## 2021-05-15 DIAGNOSIS — R0902 Hypoxemia: Secondary | ICD-10-CM | POA: Diagnosis not present

## 2021-05-15 DIAGNOSIS — I509 Heart failure, unspecified: Secondary | ICD-10-CM | POA: Diagnosis not present

## 2021-05-16 ENCOUNTER — Encounter: Payer: Self-pay | Admitting: Registered Nurse

## 2021-05-16 ENCOUNTER — Other Ambulatory Visit: Payer: Self-pay

## 2021-05-16 ENCOUNTER — Encounter: Payer: Medicare Other | Attending: Registered Nurse | Admitting: Registered Nurse

## 2021-05-16 VITALS — BP 155/79 | HR 82 | Ht 64.0 in | Wt 254.6 lb

## 2021-05-16 DIAGNOSIS — M48062 Spinal stenosis, lumbar region with neurogenic claudication: Secondary | ICD-10-CM | POA: Diagnosis not present

## 2021-05-16 DIAGNOSIS — M5412 Radiculopathy, cervical region: Secondary | ICD-10-CM | POA: Insufficient documentation

## 2021-05-16 DIAGNOSIS — M5416 Radiculopathy, lumbar region: Secondary | ICD-10-CM | POA: Insufficient documentation

## 2021-05-16 DIAGNOSIS — Z79891 Long term (current) use of opiate analgesic: Secondary | ICD-10-CM | POA: Insufficient documentation

## 2021-05-16 DIAGNOSIS — M546 Pain in thoracic spine: Secondary | ICD-10-CM | POA: Insufficient documentation

## 2021-05-16 DIAGNOSIS — M7062 Trochanteric bursitis, left hip: Secondary | ICD-10-CM | POA: Insufficient documentation

## 2021-05-16 DIAGNOSIS — M542 Cervicalgia: Secondary | ICD-10-CM | POA: Insufficient documentation

## 2021-05-16 DIAGNOSIS — M1712 Unilateral primary osteoarthritis, left knee: Secondary | ICD-10-CM | POA: Diagnosis not present

## 2021-05-16 DIAGNOSIS — G894 Chronic pain syndrome: Secondary | ICD-10-CM | POA: Insufficient documentation

## 2021-05-16 DIAGNOSIS — M1711 Unilateral primary osteoarthritis, right knee: Secondary | ICD-10-CM | POA: Insufficient documentation

## 2021-05-16 DIAGNOSIS — M7061 Trochanteric bursitis, right hip: Secondary | ICD-10-CM | POA: Diagnosis not present

## 2021-05-16 DIAGNOSIS — Z5181 Encounter for therapeutic drug level monitoring: Secondary | ICD-10-CM | POA: Insufficient documentation

## 2021-05-16 MED ORDER — MORPHINE SULFATE ER 30 MG PO TBCR: 30.0000 mg | EXTENDED_RELEASE_TABLET | Freq: Two times a day (BID) | ORAL | 0 refills | Status: DC

## 2021-05-16 NOTE — Addendum Note (Signed)
Addended by: Casilda Carls on: 05/16/2021 01:59 PM ? ? Modules accepted: Orders ? ?

## 2021-05-16 NOTE — Progress Notes (Signed)
Subjective:    Patient ID: [name], female    DOB: Feb 13, 1951, 71 y.o.   MRN: [id number]  HPI: [name] is a 71 y.o. female who returns for follow up appointment for chronic pain and medication refill. She states her pain is located in her neck radiating into her bilateral shoulders, mid- lower back ain radiating into her bilateral hips and bilateral lower extremities. She also reports generalized joint pain. She  rates her pain 10. Her current exercise regime is walking and performing stretching exercises.  [name] had a fall on 04/12/2021, she states she was walking to the bathroom, she fell backwards. She went to [facility] to be evaluated, note was reviewed.   [name] asked if their was a Pain clinic in [place], she states her boyfriend is getting tired bringing her to [place]. We discussed transportation service and she can ask her PCP for referral. She verbalizes understanding.   [name] Morphine equivalent is 60.00 MME. She  is also prescribed Alprazolam by Dr. Woody Seller .We have discussed the black box warning of using opioids and benzodiazepines. I highlighted the dangers of using these drugs together and discussed the adverse events including respiratory suppression, overdose, cognitive impairment and importance of compliance with current regimen. We will continue to monitor and adjust as indicated.    UDS ordered today.     Pain Inventory Average Pain 10 Pain Right Now 10 My pain is constant, sharp, burning, stabbing, tingling, and aching  In the last 24 hours, has pain interfered with the following? General activity 10 Relation with others 10 Enjoyment of life 10 What TIME of day is your pain at its worst? morning , daytime, evening, and night Sleep (in general) Poor  Pain is worse with: walking, bending, sitting, inactivity, standing, unsure, and some activites Pain improves with: medication Relief from Meds: 8  Family History  Problem Relation Age of Onset    Heart attack Mother    Colon cancer Mother    Heart attack Father    Stroke Sister    Hemophilia Child    Cancer Other    Coronary artery disease Other    Cancer Brother    Cancer Maternal Aunt    Cancer Maternal Uncle    Cancer Paternal Aunt    Cancer Paternal Uncle    Cancer Brother    Social History   Socioeconomic History   Marital status: Divorced    Spouse name: Not on file   Number of children: 4   Years of education: Not on file   Highest education level: Not on file  Occupational History   Occupation: Disabled  Tobacco Use   Smoking status: Never   Smokeless tobacco: Never  Vaping Use   Vaping Use: Never used  Substance and Sexual Activity   Alcohol use: No    Alcohol/week: 0.0 standard drinks   Drug use: No   Sexual activity: Not on file  Other Topics Concern   Not on file  Social History Narrative   Patient does not get regular exercise   Denies caffeine use    Social Determinants of Health   Financial Resource Strain: Not on file  Food Insecurity: Not on file  Transportation Needs: Not on file  Physical Activity: Not on file  Stress: Not on file  Social Connections: Not on file   Past Surgical History:  Procedure Laterality Date   ABDOMINAL HYSTERECTOMY     Arm Surgery Bilateral    due  to fall-pt broke both forearms   BIOPSY N/A 07/01/2013   Procedure: BIOPSY;  Surgeon: Rogene Houston, MD;  [facility];  Service: Endoscopy;  Laterality: N/A;   CHOLECYSTECTOMY     COLONOSCOPY  08/06/2011   Procedure: COLONOSCOPY;  Surgeon: Rogene Houston, MD;  [facility];  Service: Endoscopy;  Laterality: N/A;  215   COLONOSCOPY WITH PROPOFOL N/A 12/31/2012   Procedure: COLONOSCOPY WITH PROPOFOL;  Surgeon: Rogene Houston, MD;  Location: [facility];  Service: Endoscopy;  Laterality: N/A;  in cecum at 0814, total withdrawal time 51mn   COLONOSCOPY WITH PROPOFOL N/A 02/05/2018   Procedure: COLONOSCOPY WITH PROPOFOL;  Surgeon: RRogene Houston  MD;  [facility];  Service: Endoscopy;  Laterality: N/A;  1:25   CORONARY ANGIOPLASTY WITH STENT PLACEMENT     CYSTOSCOPY W/ URETERAL STENT PLACEMENT Right 11/10/2019   [place] URE[place] AND STENT PLACEMENT Right 12/22/2019   Procedure: CYSTOSCOPY WITH RIGHT URETERAL STENT REMOVAL; RIGHT RETROGRADE PYELOGRAM,RIGHT URETEROSCOPY;  Surgeon: MCleon Gustin MD;  [facility];  Service: Urology;  Laterality: Right;   ESOPHAGEAL DILATION N/A 06/07/2015   Procedure: ESOPHAGEAL DILATION;  Surgeon: NRogene Houston MD;  [facility];  Service: Endoscopy;  Laterality: N/A;   ESOPHAGOGASTRODUODENOSCOPY N/A 06/07/2015   Procedure: ESOPHAGOGASTRODUODENOSCOPY (EGD);  Surgeon: NRogene Houston MD;  [facility];  Service: Endoscopy;  Laterality: N/A;  2:45 - moved to 1:00 - Ann to notify pt   ESOPHAGOGASTRODUODENOSCOPY (EGD) WITH PROPOFOL N/A 12/31/2012   Procedure: ESOPHAGOGASTRODUODENOSCOPY (EGD) WITH PROPOFOL;  Surgeon: NRogene Houston MD;  [facility];  Service: Endoscopy;  Laterality: N/A;  GE junction 37,   ESOPHAGOGASTRODUODENOSCOPY (EGD) WITH PROPOFOL N/A 07/01/2013   Procedure: ESOPHAGOGASTRODUODENOSCOPY (EGD) WITH PROPOFOL;  Surgeon: NRogene Houston MD;  [facility];  Service: Endoscopy;  Laterality: N/A;  [place] 12/31/2012   Procedure: MALONEY DILATION;  Surgeon: NRogene Houston MD;  [facility];  Service: Endoscopy;  Laterality: N/A;  used a # 54,#56   MALONEY DILATION N/A 07/01/2013   Procedure: MALONEY DILATION;  Surgeon: NRogene Houston MD;  [facility];  Service: Endoscopy;  Laterality: N/A;  54/58; no heme present   POLYPECTOMY  02/05/2018   Procedure: POLYPECTOMY;  Surgeon: RRogene Houston MD;  [facility];  Service: Endoscopy;;  distal rectum (HS)   STONE EXTRACTION WITH BASKET Right 12/22/2019   Procedure: STONE EXTRACTION WITH BASKET;  Surgeon: MCleon Gustin MD;  [facility];  Service: Urology;  Laterality: Right;   Past Surgical History:  Procedure Laterality Date   ABDOMINAL HYSTERECTOMY     Arm Surgery Bilateral    due to fall-pt broke both forearms   BIOPSY N/A 07/01/2013   Procedure: BIOPSY;  Surgeon: NRogene Houston MD;  [facility];  Service: Endoscopy;  Laterality: N/A;   CHOLECYSTECTOMY     COLONOSCOPY  08/06/2011   Procedure: COLONOSCOPY;  Surgeon: NRogene Houston MD;  [facility];  Service: Endoscopy;  Laterality: N/A;  215   COLONOSCOPY WITH PROPOFOL N/A 12/31/2012   Procedure: COLONOSCOPY WITH PROPOFOL;  Surgeon: NRogene Houston MD;  [facility];  Service: Endoscopy;  Laterality: N/A;  in cecum at 0814, total withdrawal time 152m   COLONOSCOPY WITH PROPOFOL N/A 02/05/2018   Procedure: COLONOSCOPY WITH PROPOFOL;  Surgeon: ReRogene HoustonMD;  [facility]  [facility];  Service: Endoscopy;  Laterality: N/A;  1:25   CORONARY ANGIOPLASTY WITH STENT PLACEMENT     CYSTOSCOPY W/ URETERAL STENT PLACEMENT Right 11/10/2019   at [place], URETEROSCOPY AND STENT PLACEMENT Right 12/22/2019   Procedure: CYSTOSCOPY WITH RIGHT URETERAL STENT REMOVAL; RIGHT RETROGRADE PYELOGRAM,RIGHT URETEROSCOPY;  Surgeon: Cleon Gustin, MD;  [facility];  Service: Urology;  Laterality: Right;   ESOPHAGEAL DILATION N/A 06/07/2015   Procedure: ESOPHAGEAL DILATION;  Surgeon: Rogene Houston, MD;  [facility];  Service: Endoscopy;  Laterality: N/A;   ESOPHAGOGASTRODUODENOSCOPY N/A 06/07/2015   Procedure: ESOPHAGOGASTRODUODENOSCOPY (EGD);  Surgeon: Rogene Houston, MD;  [facility];  Service: Endoscopy;  Laterality: N/A;  2:45 - moved to 1:00 - Ann to notify pt   ESOPHAGOGASTRODUODENOSCOPY (EGD) WITH PROPOFOL N/A 12/31/2012   Procedure: ESOPHAGOGASTRODUODENOSCOPY (EGD) WITH PROPOFOL;  Surgeon: Rogene Houston, MD;  [facility];  Service: Endoscopy;   Laterality: N/A;  GE junction 37,   ESOPHAGOGASTRODUODENOSCOPY (EGD) WITH PROPOFOL N/A 07/01/2013   Procedure: ESOPHAGOGASTRODUODENOSCOPY (EGD) WITH PROPOFOL;  Surgeon: Rogene Houston, MD;  [facility];  Service: Endoscopy;  Laterality: N/A;  [place] N/A 12/31/2012   Procedure: MALONEY DILATION;  Surgeon: Rogene Houston, MD;  [facility];  Service: Endoscopy;  Laterality: N/A;  used a # 54,#56   MALONEY DILATION N/A 07/01/2013   Procedure: MALONEY DILATION;  Surgeon: Rogene Houston, MD;  [facility];  Service: Endoscopy;  Laterality: N/A;  54/58; no heme present   POLYPECTOMY  02/05/2018   Procedure: POLYPECTOMY;  Surgeon: Rogene Houston, MD;  [facility];  Service: Endoscopy;;  distal rectum (HS)   STONE EXTRACTION WITH BASKET Right 12/22/2019   Procedure: STONE EXTRACTION WITH BASKET;  Surgeon: Cleon Gustin, MD;  Location[facility];  Service: Urology;  Laterality: Right;   Past Medical History:  Diagnosis Date   Anxiety and depression    Arthritis    CAD (coronary artery disease)    Stent placement circumflex coronary 2007, catheterization 2008 patent stents. 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    High cholesterol    Hx of blood clots    Hypertension    MI (myocardial infarction) ([place])    2007   Obstructive sleep apnea    CPAP, setting ?2   Palpitations    Sleep apnea    Tachycardia    BP (!) 155/79    Pulse 82    Ht 5' 4"  (1.626 m)    Wt 254 lb 9.6 oz (115.5 kg)    SpO2 94%    BMI 43.70 kg/m   Opioid Risk Score:   Fall Risk Score:  `1  Depression screen PHQ  2/9  Depression screen [facility] 2/9 05/16/2021 10/11/2020 01/24/2020 11/18/2019 10/26/2019 09/26/2019 03/24/2019  Decreased Interest 0 1 0 1 0 0 1  Down, Depressed, Hopeless 3 - 0 1 0 0 1  PHQ - 2 Score 3  1 0 2 0 0 2  Altered sleeping - - - - - - -  Tired, decreased energy - - - - - - -  Change in appetite - - - - - - -  Feeling bad or failure about yourself  - - - - - - -  Trouble concentrating - - - - - - -  Moving slowly or fidgety/restless - - - - - - -  Suicidal thoughts - - - - - - -  PHQ-9 Score - - - - - - -  Difficult doing work/chores - - - - - - -  Some recent data might be hidden     Review of Systems  Constitutional: Negative.   HENT: Negative.    Eyes: Negative.   Respiratory: Negative.    Cardiovascular: Negative.   Gastrointestinal: Negative.   Endocrine: Negative.   Genitourinary: Negative.   Musculoskeletal:  Positive for back pain.  Skin: Negative.   Allergic/Immunologic: Negative.   Neurological: Negative.   Hematological: Negative.   Psychiatric/Behavioral: Negative.        Objective:   Physical Exam Vitals and nursing note reviewed.  Constitutional:      Appearance: Normal appearance.  Cardiovascular:     Rate and Rhythm: Normal rate and regular rhythm.     Pulses: Normal pulses.     Heart sounds: Normal heart sounds.  Pulmonary:     Effort: Pulmonary effort is normal.     Breath sounds: Normal breath sounds.  Musculoskeletal:     Cervical back: Normal range of motion and neck supple.     Comments: Normal Muscle Bulk and Muscle Testing Reveals:  Upper Extremities:Full  ROM and Muscle Strength 5/5 Bilateral AC Joint Tenderness Thoracic and Lumbar Hypersensitivity Bilateral Greater Trochanter Tenderness Lower Extremities: Full ROM and Muscle Strength 5/5 Arises from Table Slowly using cane for support Antalgic  Gait     Skin:    General: Skin is warm and dry.  Neurological:     Mental Status: She is alert and oriented to person, place, and time.   Psychiatric:        Mood and Affect: Mood normal.        Behavior: Behavior normal.         Assessment & Plan:  1.Lumbar pain lumbar spondylosis:  Encouraged to continue to increase Activity as tolerated. 05/16/2021 Continue current medication regimen.  Refilled: MS Contin 30 MG one tablet every 12 hours #60. Boyfriend Joe is dispensing Ms. Hilaire Medication she reports. We will continue the opioid monitoring program, this consists of regular clinic visits, examinations, urine drug screen, pill counts as well as use of [place] Controlled Substance Reporting system. A 12 month History has been reviewed on the [place] on 05/16/2021.  2. Lumbar Radiculitis: Continue current medication regimen with Gabapentin. 05/16/2021 3. Bilateral Knee Pain/ Degenerative: Continue Voltaren Gel. Continue to Monitor. 05/16/2021 4. Severe diabetic poly neuropathy: Continue current medication regimen with Gabapentin. 05/16/2021 5. Insomnia: Continue Armodafinil Psychiatry Following. 05/16/2021 6.Anxiety/Depression: Continue Current Medication regimen as prescribed by PCP and   Psychiatry. 05/16/2021 7. De Quervains Tenosynovitis: No Complaints Today. 05/16/2021 8. Bilateral  Greater Trochanteric Bursitis: Continue with Ice and Heat Therapy. Continue to Monitor. 05/16/2021.  9. Cervicalgia/ Cervical Radiculitis:Continue Gabapentin. Continue with HEP as Tolerated and continue to monitor. 05/16/2021 10. Chronic Bilateral Thoracic Back Pain: Continue HEP as Tolerated. Continue current medication regimen. Continue to monitor. 05/16/2021.  11. Onycholysis: Left Great Toe: She  will F/U with her Podiatrist, she states.   F/U in 1 month

## 2021-05-22 ENCOUNTER — Telehealth: Payer: Self-pay | Admitting: *Deleted

## 2021-05-22 LAB — DRUG TOX MONITOR 1 W/CONF, ORAL FLD
Alprazolam: 0.54 ng/mL — ABNORMAL HIGH (ref <0.50)
Amphetamines: NEGATIVE ng/mL (ref <10)
Barbiturates: NEGATIVE ng/mL (ref <10)
Benzodiazepines: POSITIVE ng/mL — AB (ref <0.50)
Buprenorphine: NEGATIVE ng/mL (ref <0.10)
Chlordiazepoxide: NEGATIVE ng/mL (ref <0.50)
Clonazepam: NEGATIVE ng/mL (ref <0.50)
Cocaine: NEGATIVE ng/mL (ref <5.0)
Codeine: NEGATIVE ng/mL (ref <2.5)
Diazepam: NEGATIVE ng/mL (ref <0.50)
Dihydrocodeine: NEGATIVE ng/mL (ref <2.5)
Fentanyl: NEGATIVE ng/mL (ref <0.10)
Flunitrazepam: NEGATIVE ng/mL (ref <0.50)
Flurazepam: NEGATIVE ng/mL (ref <0.50)
Heroin Metabolite: NEGATIVE ng/mL (ref <1.0)
Hydrocodone: NEGATIVE ng/mL (ref <2.5)
Hydromorphone: NEGATIVE ng/mL (ref <2.5)
Lorazepam: NEGATIVE ng/mL (ref <0.50)
MARIJUANA: NEGATIVE ng/mL (ref <2.5)
MDMA: NEGATIVE ng/mL (ref <10)
Meprobamate: NEGATIVE ng/mL (ref <2.5)
Methadone: NEGATIVE ng/mL (ref <5.0)
Midazolam: NEGATIVE ng/mL (ref <0.50)
Morphine: 4.2 ng/mL — ABNORMAL HIGH (ref <2.5)
Nicotine Metabolite: NEGATIVE ng/mL (ref <5.0)
Nordiazepam: NEGATIVE ng/mL (ref <0.50)
Norhydrocodone: NEGATIVE ng/mL (ref <2.5)
Noroxycodone: NEGATIVE ng/mL (ref <2.5)
Opiates: POSITIVE ng/mL — AB (ref <2.5)
Oxazepam: NEGATIVE ng/mL (ref <0.50)
Oxycodone: NEGATIVE ng/mL (ref <2.5)
Oxymorphone: NEGATIVE ng/mL (ref <2.5)
Phencyclidine: NEGATIVE ng/mL (ref <10)
Tapentadol: NEGATIVE ng/mL (ref <5.0)
Temazepam: NEGATIVE ng/mL (ref <0.50)
Tramadol: NEGATIVE ng/mL (ref <5.0)
Triazolam: NEGATIVE ng/mL (ref <0.50)
Zolpidem: NEGATIVE ng/mL (ref <5.0)

## 2021-05-22 LAB — DRUG TOX ALC METAB W/CON, ORAL FLD

## 2021-05-22 NOTE — Telephone Encounter (Signed)
Oral swab drug screen was consistent for prescribed medications.  ?

## 2021-05-28 ENCOUNTER — Telehealth: Payer: Self-pay | Admitting: Registered Nurse

## 2021-05-28 DIAGNOSIS — M179 Osteoarthritis of knee, unspecified: Secondary | ICD-10-CM | POA: Diagnosis not present

## 2021-05-28 DIAGNOSIS — R296 Repeated falls: Secondary | ICD-10-CM | POA: Diagnosis not present

## 2021-05-28 MED ORDER — MORPHINE SULFATE ER 30 MG PO TBCR: 30.0000 mg | EXTENDED_RELEASE_TABLET | Freq: Two times a day (BID) | ORAL | 0 refills | Status: DC

## 2021-05-28 NOTE — Telephone Encounter (Signed)
See note: scheduled changed/.  ?

## 2021-05-28 NOTE — Telephone Encounter (Signed)
PMP was Reviewed.  ?MS Contin e-scribed for April due to appointment scheduled change.  ?Ms.Marland Kitchen Daffin is aware.  ?

## 2021-06-13 DIAGNOSIS — K219 Gastro-esophageal reflux disease without esophagitis: Secondary | ICD-10-CM | POA: Diagnosis not present

## 2021-06-13 DIAGNOSIS — E7800 Pure hypercholesterolemia, unspecified: Secondary | ICD-10-CM | POA: Diagnosis not present

## 2021-06-13 DIAGNOSIS — I10 Essential (primary) hypertension: Secondary | ICD-10-CM | POA: Diagnosis not present

## 2021-06-13 DIAGNOSIS — E1165 Type 2 diabetes mellitus with hyperglycemia: Secondary | ICD-10-CM | POA: Diagnosis not present

## 2021-06-18 ENCOUNTER — Ambulatory Visit: Payer: Medicare Other | Admitting: Registered Nurse

## 2021-06-24 ENCOUNTER — Encounter (INDEPENDENT_AMBULATORY_CARE_PROVIDER_SITE_OTHER): Payer: Self-pay | Admitting: *Deleted

## 2021-07-02 ENCOUNTER — Ambulatory Visit: Payer: 59 | Admitting: "Endocrinology

## 2021-07-02 ENCOUNTER — Encounter: Payer: Self-pay | Admitting: "Endocrinology

## 2021-07-02 ENCOUNTER — Ambulatory Visit (INDEPENDENT_AMBULATORY_CARE_PROVIDER_SITE_OTHER): Payer: 59 | Admitting: "Endocrinology

## 2021-07-02 ENCOUNTER — Encounter: Payer: 59 | Attending: Registered Nurse | Admitting: Registered Nurse

## 2021-07-02 ENCOUNTER — Encounter: Payer: Self-pay | Admitting: Registered Nurse

## 2021-07-02 VITALS — BP 148/80 | HR 81 | Ht 64.0 in | Wt 252.6 lb

## 2021-07-02 VITALS — BP 159/79 | HR 86 | Ht 64.0 in | Wt 253.4 lb

## 2021-07-02 DIAGNOSIS — M5412 Radiculopathy, cervical region: Secondary | ICD-10-CM | POA: Diagnosis present

## 2021-07-02 DIAGNOSIS — M1711 Unilateral primary osteoarthritis, right knee: Secondary | ICD-10-CM | POA: Diagnosis present

## 2021-07-02 DIAGNOSIS — E1159 Type 2 diabetes mellitus with other circulatory complications: Secondary | ICD-10-CM | POA: Diagnosis not present

## 2021-07-02 DIAGNOSIS — M542 Cervicalgia: Secondary | ICD-10-CM | POA: Diagnosis present

## 2021-07-02 DIAGNOSIS — E782 Mixed hyperlipidemia: Secondary | ICD-10-CM

## 2021-07-02 DIAGNOSIS — M7061 Trochanteric bursitis, right hip: Secondary | ICD-10-CM | POA: Diagnosis present

## 2021-07-02 DIAGNOSIS — M48062 Spinal stenosis, lumbar region with neurogenic claudication: Secondary | ICD-10-CM | POA: Diagnosis present

## 2021-07-02 DIAGNOSIS — M5416 Radiculopathy, lumbar region: Secondary | ICD-10-CM

## 2021-07-02 DIAGNOSIS — M1712 Unilateral primary osteoarthritis, left knee: Secondary | ICD-10-CM

## 2021-07-02 DIAGNOSIS — Z5181 Encounter for therapeutic drug level monitoring: Secondary | ICD-10-CM

## 2021-07-02 DIAGNOSIS — G894 Chronic pain syndrome: Secondary | ICD-10-CM | POA: Diagnosis present

## 2021-07-02 DIAGNOSIS — M546 Pain in thoracic spine: Secondary | ICD-10-CM | POA: Diagnosis present

## 2021-07-02 DIAGNOSIS — E559 Vitamin D deficiency, unspecified: Secondary | ICD-10-CM | POA: Diagnosis not present

## 2021-07-02 DIAGNOSIS — E1140 Type 2 diabetes mellitus with diabetic neuropathy, unspecified: Secondary | ICD-10-CM

## 2021-07-02 DIAGNOSIS — I10 Essential (primary) hypertension: Secondary | ICD-10-CM

## 2021-07-02 DIAGNOSIS — Z79891 Long term (current) use of opiate analgesic: Secondary | ICD-10-CM

## 2021-07-02 DIAGNOSIS — M7062 Trochanteric bursitis, left hip: Secondary | ICD-10-CM | POA: Diagnosis present

## 2021-07-02 MED ORDER — INSULIN LISPRO PROT & LISPRO (75-25 MIX) 100 UNIT/ML KWIKPEN
60.0000 [IU] | PEN_INJECTOR | Freq: Two times a day (BID) | SUBCUTANEOUS | 2 refills | Status: DC
Start: 2021-07-02 — End: 2021-09-03

## 2021-07-02 MED ORDER — ACCU-CHEK GUIDE ME W/DEVICE KIT: 1.0000 | PACK | 0 refills | Status: DC

## 2021-07-02 MED ORDER — ACCU-CHEK GUIDE VI STRP: ORAL_STRIP | 2 refills | Status: DC

## 2021-07-02 MED ORDER — MORPHINE SULFATE ER 30 MG PO TBCR
30.0000 mg | EXTENDED_RELEASE_TABLET | Freq: Two times a day (BID) | ORAL | 0 refills | Status: DC
Start: 2021-07-02 — End: 2021-08-09

## 2021-07-02 NOTE — Patient Instructions (Signed)

## 2021-07-02 NOTE — Progress Notes (Signed)
? ?Subjective:  ? ? Patient ID: [name], female    DOB: February 15, 1951, 71 y.o.   MRN: [id number] ? ?HPI: [name] is a 71 y.o. female who returns for follow up appointment for chronic pain and medication refill. She states her pain is located in her neck radiating into her bilateral shoulders, mid- lower back pain radiating into her bilateral hips and bilateral lower extremities. She also reports bilateral feet pain with tingling and numbness. She rates her pain 7. Her current exercise regime is walking and performing stretching exercises. ? ?Ms. Sipos Morphine equivalent is 60.00 MME.   Last Oral Swab was Performed on 05/16/2021, it was consistent.  ?  ? ?Pain Inventory ?Average Pain 7 ?Pain Right Now 7 ?My pain is sharp, burning, dull, stabbing, tingling, and aching ? ?LOCATION OF PAIN  head, hand, fingers, back, hip, knee, leg, ankle ? ?BOWEL ?Number of stools per week: unknown ?History of colostomy Yes  ?Incontinent No  ? ?BLADDER ?Normal ?Bladder incontinence No  ?Frequent urination No  ?Leakage with coughing Yes  ?Difficulty starting stream No  ?Incomplete bladder emptying No  ? ? ?Mobility ?walk with assistance ?use a cane ?use a walker ?ability to climb steps?  no ?do you drive?  no ?Do you have any goals in this area?  no ? ?Function ?not employed: date last employed . ?disabled: date disabled . ? ?Neuro/Psych ?weakness ?tremor ?tingling ?loss of taste or smell ? ?Prior Studies ?Any changes since last visit?  no ? ?Physicians involved in your care ?Any changes since last visit?  no ? ? ?Family History  ?Problem Relation Age of Onset  ? Heart attack Mother   ? Colon cancer Mother   ? Heart attack Father   ? Stroke Sister   ? Hemophilia Child   ? Cancer Other   ? Coronary artery disease Other   ? Cancer Brother   ? Cancer Maternal Aunt   ? Cancer Maternal Uncle   ? Cancer Paternal Aunt   ? Cancer Paternal Uncle   ? Cancer Brother   ? ?Social History  ? ?Socioeconomic History  ? Marital status:  Divorced  ?  Spouse name: Not on file  ? Number of children: 4  ? Years of education: Not on file  ? Highest education level: Not on file  ?Occupational History  ? Occupation: Disabled  ?Tobacco Use  ? Smoking status: Never  ? Smokeless tobacco: Never  ?Vaping Use  ? Vaping Use: Never used  ?Substance and Sexual Activity  ? Alcohol use: No  ?  Alcohol/week: 0.0 standard drinks  ? Drug use: No  ? Sexual activity: Not on file  ?Other Topics Concern  ? Not on file  ?Social History Narrative  ? Patient does not get regular exercise  ? Denies caffeine use   ? ?Social Determinants of Health  ? ?Financial Resource Strain: Not on file  ?Food Insecurity: Not on file  ?Transportation Needs: Not on file  ?Physical Activity: Not on file  ?Stress: Not on file  ?Social Connections: Not on file  ? ?Past Surgical History:  ?Procedure Laterality Date  ? ABDOMINAL HYSTERECTOMY    ? Arm Surgery Bilateral   ? due to fall-pt broke both forearms  ? BIOPSY N/A 07/01/2013  ? Procedure: BIOPSY;  Surgeon: Rogene Houston, MD;  [facility];  Service: Endoscopy;  Laterality: N/A;  ? CHOLECYSTECTOMY    ? COLONOSCOPY  08/06/2011  ? Procedure: COLONOSCOPY;  Surgeon: Mechele Dawley  Laural Golden, MD;  [facility];  Service: Endoscopy;  Laterality: N/A;  215  ? COLONOSCOPY WITH PROPOFOL N/A 12/31/2012  ? Procedure: COLONOSCOPY WITH PROPOFOL;  Surgeon: Rogene Houston, MD;  [facility]: [facility];  Service: Endoscopy;  Laterality: N/A;  in cecum at 0814, total withdrawal time 32mn  ? COLONOSCOPY WITH PROPOFOL N/A 02/05/2018  ? Procedure: COLONOSCOPY WITH PROPOFOL;  Surgeon: RRogene Houston MD;  [facility];  Service: Endoscopy;  Laterality: N/A;  1:25  ? CORONARY ANGIOPLASTY WITH STENT PLACEMENT    ? CYSTOSCOPY W/ URETERAL STENT PLACEMENT Right [facility]10/2019  ? [facility] ? CYSTOSCOPY WITH RETROGRADE PYELOGRAM, URETEROSCOPY AND STENT PLACEMENT Right 12/22/2019  ? Procedure: CYSTOSCOPY WITH RIGHT URETERAL STENT REMOVAL; RIGHT RETROGRADE  PYELOGRAM,RIGHT URETEROSCOPY;  Surgeon: MCleon Gustin MD;  [facility];  Service: Urology;  Laterality: Right;  ? ESOPHAGEAL DILATION N/A 06/07/2015  ? Procedure: ESOPHAGEAL DILATION;  Surgeon: NRogene Houston MD;  [facility];  Service: Endoscopy;  Laterality: N/A;  ? ESOPHAGOGASTRODUODENOSCOPY N/A 06/07/2015  ? Procedure: ESOPHAGOGASTRODUODENOSCOPY (EGD);  Surgeon: NRogene Houston MD;  [facility];  Service: Endoscopy;  Laterality: N/A;  2:45 - moved to 1:00 - Ann to notify pt  ? ESOPHAGOGASTRODUODENOSCOPY (EGD) WITH PROPOFOL N/A 12/31/2012  ? Procedure: ESOPHAGOGASTRODUODENOSCOPY (EGD) WITH PROPOFOL;  Surgeon: NRogene Houston MD;  [facility];  Service: Endoscopy;  Laterality: N/A;  GE junction 37,  ? ESOPHAGOGASTRODUODENOSCOPY (EGD) WITH PROPOFOL N/A 07/01/2013  ? Procedure: ESOPHAGOGASTRODUODENOSCOPY (EGD) WITH PROPOFOL;  Surgeon: NRogene Houston MD;  [facility]: [facility];  Service: Endoscopy;  Laterality: N/A;  950  ? MALONEY DILATION N/A 12/31/2012  ? Procedure: MALONEY DILATION;  Surgeon: NRogene Houston MD;  [facility];  Service: Endoscopy;  Laterality: N/A;  used a # 54,#56  ? MALONEY DILATION N/A 07/01/2013  ? Procedure: MALONEY DILATION;  Surgeon: NRogene Houston MD;  [facility];  Service: Endoscopy;  Laterality: N/A;  54/58; no heme present  ? POLYPECTOMY  02/05/2018  ? Procedure: POLYPECTOMY;  Surgeon: RRogene Houston MD;  [facility];  Service: Endoscopy;;  distal rectum (HS)  ? STONE EXTRACTION WITH BASKET Right 12/22/2019  ? Procedure: STONE EXTRACTION WITH BASKET;  Surgeon: MCleon Gustin MD;  [facility]: [facility];  Service: Urology;  Laterality: Right;  ? ?Past Medical History:  ?Diagnosis Date  ? Anxiety and depression   ? Arthritis   ? CAD (coronary artery disease)   ? Stent placement circumflex coronary 2007, catheterization 2008 patent stents. Normal LV function  ? Chest pain   ? [place]   ? COPD  (ch[place] obstructive [place] disease) ([place]   ? no home O2  ? Depression   ? Diabetes mellitus   ? Insulin dependent  ? Diabetic polyneuropathy ([place]   ?  severe on multiple medications  ? Dyslipidemia   ? GERD (gastroesophageal reflux disease)   ? Headache(784.0)   ? Heart attack ([place]   ? Heartburn   ? 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 High cholesterol   ? Hx of blood clots   ? Hypertension   ? [place]   ? 2007  ? Obstructive sleep apnea   ? CPAP, setting ?2  ? Palpitations   ? Sleep apnea   ? Tachycardia   ? ?BP (!) 160/80   Pulse 86   Ht 5' 4"  (1.626 m)   Wt 252 lb  9.6 oz (114.6 kg)   SpO2 91%   BMI 43.36 kg/m?  ? ?Opioid Risk Score:   ?Fall Risk Score:  `1 ? ?Depression screen PHQ 2/9 ? ? ?  05/16/2021  ?  1:15 PM 10/11/2020  ?  1:04 PM 01/24/2020  ?  1:19 PM 11/18/2019  ? 10:36 AM 10/26/2019  ?  1:03 PM 09/26/2019  ?  2:12 PM 03/24/2019  ?  1:22 PM  ?Depression screen PHQ 2/9  ?Decreased Interest 0 1 0 1 0 0 1  ?Down, Depressed, Hopeless 3  0 1 0 0 1  ?PHQ - 2 Score 3 1 0 2 0 0 2  ?  ? ?Review of Systems  ?Constitutional:  Positive for unexpected weight change.  ?HENT: Negative.    ?Eyes: Negative.   ?Respiratory:  Positive for apnea.   ?Cardiovascular: Negative.   ?Gastrointestinal:  Positive for diarrhea, nausea and vomiting.  ?Endocrine: Negative.   ?Genitourinary: Negative.   ?Musculoskeletal:  Positive for back pain, gait problem and neck pain.  ?Skin: Negative.   ?Allergic/Immunologic: Negative.   ?Neurological:  Positive for tremors, weakness and headaches.  ?Hematological: Negative.   ?Psychiatric/Behavioral: Negative.    ? ?   ?Objective:  ? Physical Exam ?Vitals and nursing note reviewed.  ?Constitutional:   ?   Appearance: Normal appearance.  ?Cardiovascular:  ?   Rate and Rhythm: Normal rate and regular rhythm.  ?   Pulses: Normal pulses.  ?   Heart sounds: Normal heart sounds.  ?Pulmonary:  ?   Effort: Pulmonary effort is normal.  ?   Breath sounds: Normal breath sounds.   ?Musculoskeletal:  ?   Cervical back: Normal range of motion and neck supple.  ?   Comments: Normal Muscle Bulk and Muscle Testing Reveals:  ?Upper Extremities: Full ROM and Muscle Strength 5/5 ?Thoracic and Lumbar Hypersensitivity

## 2021-07-02 NOTE — Progress Notes (Signed)
? ?07/02/2021 ? ? ?Endocrinology follow-up note ? ? ?Subjective:  ? ? Patient ID: [name], female    DOB: Jul 26, 1950. Patient is returning with incomplete glycemic profile, still significantly above target.  She still has not used her rapid acting insulin correctly.  -She is on follow-up for currently uncontrolled, complicated type 2 diabetes; hyperlipidemia; hypertension. ? ?Past Medical History:  ?Diagnosis Date  ? Anxiety and depression   ? Arthritis   ? CAD (coronary artery disease)   ? Stent placement circumflex coronary 2007, catheterization 2008 patent stents. Normal LV function  ? Chest pain   ? CHF (congestive heart failure) ([place])   ? COPD (chronic obstructive pulmonary disease) ([place])   ? no home O2  ? Depression   ? Diabetes mellitus   ? Insulin dependent  ? Diabetic polyneuropathy ([place])   ?  severe on multiple medications  ? Dyslipidemia   ? GERD (gastroesophageal reflux disease)   ? Headache(784.0)   ? Heart attack ([place])   ? Heartburn   ? 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 High cholesterol   ? Hx of blood clots   ? Hypertension   ? MI (myocardial infarction) ([place])   ? 2007  ? Obstructive sleep apnea   ? CPAP, setting ?2  ? Palpitations   ? Sleep apnea   ? Tachycardia   ? ?Past Surgical History:  ?Procedure Laterality Date  ? ABDOMINAL HYSTERECTOMY    ? Arm Surgery Bilateral   ? due to fall-pt broke both forearms  ? BIOPSY N/A 07/01/2013  ? Procedure: BIOPSY;  Surgeon: Rogene Houston, MD;  [facility];  Service: Endoscopy;  Laterality: N/A;  ? CHOLECYSTECTOMY    ? COLONOSCOPY  08/06/2011  ? Procedure: COLONOSCOPY;  Surgeon: Rogene Houston, MD;  [facility];  Service: Endoscopy;  Laterality: N/A;  215  ? COLONOSCOPY WITH PROPOFOL N/A 12/31/2012  ? Procedure: COLONOSCOPY WITH PROPOFOL;  Surgeon: Rogene Houston, MD;  Location: [facility];  Service: Endoscopy;  Laterality: N/A;  in cecum at 0814, total withdrawal time 58mn  ? COLONOSCOPY WITH PROPOFOL N/A 02/05/2018  ? Procedure: COLONOSCOPY WITH  PROPOFOL;  Surgeon: RRogene Houston MD;  [facility];  Service: Endoscopy;  Laterality: N/A;  1:25  ? CORONARY ANGIOPLASTY WITH STENT PLACEMENT    ? CYSTOSCOPY W/ URETERAL STENT PLACEMENT [facility]  [facility] ? CYSTOSCOPY WITH RETROGRADE PYELOGRAM, URETEROSCOPY AND STENT PLACEMENT Right 12/22/2019  ? Procedure: CYSTOSCOPY WITH RIGHT URETERAL STENT REMOVAL; RIGHT RETROGRADE PYELOGRAM,RIGHT URETEROSCOPY;  Surgeon: MCleon Gustin MD;  [facility]: [facility];  Service: Urology;  Laterality: Right;  ? ESOPHAGEAL DILATION N/A 06/07/2015  ? Procedure: ESOPHAGEAL DILATION;  Surgeon: NRogene Houston MD;  [facility];  Service: Endoscopy;  Laterality: N/A;  ? ESOPHAGOGASTRODUODENOSCOPY N/A 06/07/2015  ? Procedure: ESOPHAGOGASTRODUODENOSCOPY (EGD);  Surgeon: NRogene Houston MD;  [facility];  Service: Endoscopy;  Laterality: N/A;  2:45 - moved to 1:00 - Ann to notify pt  ? ESOPHAGOGASTRODUODENOSCOPY (EGD) WITH PROPOFOL N/A 12/31/2012  ? Procedure: ESOPHAGOGASTRODUODENOSCOPY (EGD) WITH PROPOFOL;  Surgeon: NRogene Houston MD;  [facility];  Service: Endoscopy;  Laterality: N/A;  GE junction 37,  ? ESOPHAGOGASTRODUODENOSCOPY (EGD) WITH PROPOFOL N/A 07/01/2013  ? Procedure: ESOPHAGOGASTRODUODENOSCOPY (EGD) WITH PROPOFOL;  Surgeon: NRogene Houston MD;  [facility];  Service: Endoscopy;  Laterality: N/A;  950  ? MALONEY DILATION N/A 12/31/2012  ? Procedure: MALONEY DILATION;  Surgeon:  Rogene Houston, MD;  [facility]: [facility];  Service: Endoscopy;  Laterality: N/A;  used a # 54,#56  ? MALONEY DILATION N/A 07/01/2013  ? Procedure: MALONEY DILATION;  Surgeon: Rogene Houston, MD;  [facility];  Service: Endoscopy;  Laterality: N/A;  54/58; no heme present  ? POLYPECTOMY  02/05/2018  ? Procedure: POLYPECTOMY;  Surgeon: Rogene Houston, MD;  [facility];  Service: Endoscopy;;  distal rectum (HS)  ? STONE EXTRACTION WITH BASKET Right 12/22/2019  ? Procedure: STONE  EXTRACTION WITH BASKET;  Surgeon: Cleon Gustin, MD;  [facility]: [facility];  Service: Urology;  Laterality: Right;  ? ?Social History  ? ?Socioeconomic History  ? Marital status: Divorced  ?  Spouse name: Not on file  ? Number of children: 4  ? Years of education: Not on file  ? Highest education level: Not on file  ?Occupational History  ? Occupation: Disabled  ?Tobacco Use  ? Smoking status: Never  ? Smokeless tobacco: Never  ?Vaping Use  ? Vaping Use: Never used  ?Substance and Sexual Activity  ? Alcohol use: No  ?  Alcohol/week: 0.0 standard drinks  ? Drug use: No  ? Sexual activity: Not on file  ?Other Topics Concern  ? Not on file  ?Social History Narrative  ? Patient does not get regular exercise  ? Denies caffeine use   ? ?Social Determinants of Health  ? ?Financial Resource Strain: Not on file  ?Food Insecurity: Not on file  ?Transportation Needs: Not on file  ?Physical Activity: Not on file  ?Stress: Not on file  ?Social Connections: Not on file  ? ?Outpatient Encounter Medications as of 07/02/2021  ?Medication Sig  ? Blood Glucose Monitoring Suppl (ACCU-CHEK GUIDE ME) w/Device KIT 1 Piece by Does not apply route as directed.  ? glucose blood (ACCU-CHEK GUIDE) test strip Use as instructed  ? ALPRAZolam (XANAX) 0.5 MG tablet Take 0.5 mg by mouth 2 (two) times daily as needed for anxiety or sleep.  ? Armodafinil 150 MG tablet Take 150 mg by mouth daily.  ? aspirin EC 81 MG tablet Take 1 tablet (81 mg total) by mouth daily.  ? atorvastatin (LIPITOR) 40 MG tablet Take 40 mg by mouth daily.  ? B-D ULTRAFINE III SHORT PEN 31G X 8 MM MISC Inject into the skin 4 (four) times daily.  ? buPROPion (WELLBUTRIN SR) 100 MG 12 hr tablet Take 100 mg by mouth daily.  ? Cholecalciferol (VITAMIN D3) 125 MCG (5000 UT) CAPS TAKE ONE CAPSULE BY MOUTH DAILY (Patient taking differently: Take 5,000 Units by mouth daily.)  ? diclofenac Sodium (VOLTAREN) 1 % GEL APPLY TO BOTH KNEES THREE TIMES DAILY (Patient taking differently:  Apply 2 g topically in the morning, at noon, and at bedtime.)  ? diltiazem (CARDIZEM CD) 240 MG 24 hr capsule Take 240 mg by mouth daily.  ? doxepin (SINEQUAN) 25 MG capsule Take 75 mg by mouth at bedtime.   ? estradiol (ESTRACE) 0.5 MG tablet Take 0.5 mg by mouth daily.   ? FEROSUL 325 (65 Fe) MG tablet Take 325 mg by mouth every morning.  ? furosemide (LASIX) 40 MG tablet Take 1 tablet by mouth daily.  ? gabapentin (NEURONTIN) 400 MG capsule Take 400 mg by mouth 3 (three) times daily.  ? Insulin Lispro Prot & Lispro (HUMALOG MIX 75/25 KWIKPEN) (75-25) 100 UNIT/ML Kwikpen Inject 60 Units into the skin 2 (two) times daily before a meal.  ? lisinopril (ZESTRIL) 5  MG tablet TAKE 1 TABLET BY MOUTH DAILY  ? meloxicam (MOBIC) 7.5 MG tablet Take 7.5 mg by mouth in the morning and at bedtime.   ? metFORMIN (GLUCOPHAGE) 500 MG tablet TAKE 1 TABLET BY MOUTH TWICE DAILY (Patient taking differently: Take 500 mg by mouth 2 (two) times daily with a meal.)  ? methocarbamol (ROBAXIN) 500 MG tablet Take 500 mg by mouth 2 (two) times daily.  ? metoCLOPramide (REGLAN) 5 MG tablet Take 5 mg by mouth 3 (three) times daily as needed for nausea or vomiting.   ? metoprolol succinate (TOPROL-XL) 100 MG 24 hr tablet Take 100 mg by mouth daily. Take with or immediately following a meal.  ? montelukast (SINGULAIR) 10 MG tablet Take 10 mg by mouth at bedtime.   ? nitroGLYCERIN (NITROSTAT) 0.4 MG SL tablet Place 1 tablet (0.4 mg total) under the tongue every 5 (five) minutes as needed for chest pain (up to 3 doses. If taking 3rd dose call 911).  ? ondansetron (ZOFRAN) 4 MG tablet Take 4 mg by mouth every other day.  ? pantoprazole (PROTONIX) 40 MG tablet TAKE 1 TABLET BY MOUTH TWICE DAILY BEFORE MEALS (Patient taking differently: Take 40 mg by mouth 2 (two) times daily before a meal.)  ? polyethylene glycol (MIRALAX / GLYCOLAX) 17 g packet Take 17 g by mouth daily as needed for moderate constipation.  ? potassium chloride (KLOR-CON) 10 MEQ  tablet Take 10 mEq by mouth 2 (two) times daily.  ? PROAIR HFA 108 (90 Base) MCG/ACT inhaler Inhale 2 puffs into the lungs every 4 (four) hours as needed for wheezing or shortness of breath.   ? promethazine (PHE

## 2021-07-11 ENCOUNTER — Ambulatory Visit (INDEPENDENT_AMBULATORY_CARE_PROVIDER_SITE_OTHER): Payer: 59 | Admitting: Cardiology

## 2021-07-11 ENCOUNTER — Encounter: Payer: Self-pay | Admitting: Cardiology

## 2021-07-11 VITALS — BP 168/90 | HR 89 | Ht 64.0 in | Wt 250.0 lb

## 2021-07-11 DIAGNOSIS — I2510 Atherosclerotic heart disease of native coronary artery without angina pectoris: Secondary | ICD-10-CM

## 2021-07-11 DIAGNOSIS — G4733 Obstructive sleep apnea (adult) (pediatric): Secondary | ICD-10-CM

## 2021-07-11 DIAGNOSIS — I5033 Acute on chronic diastolic (congestive) heart failure: Secondary | ICD-10-CM | POA: Diagnosis not present

## 2021-07-11 DIAGNOSIS — Z79899 Other long term (current) drug therapy: Secondary | ICD-10-CM

## 2021-07-11 DIAGNOSIS — I10 Essential (primary) hypertension: Secondary | ICD-10-CM

## 2021-07-11 MED ORDER — FUROSEMIDE 40 MG PO TABS: 60.0000 mg | ORAL_TABLET | Freq: Every day | ORAL | 3 refills | Status: DC

## 2021-07-11 MED ORDER — ISOSORBIDE MONONITRATE ER 30 MG PO TB24: 15.0000 mg | ORAL_TABLET | Freq: Every day | ORAL | 6 refills | Status: DC

## 2021-07-11 NOTE — Addendum Note (Signed)
Addended by: Sung Amabile on: 07/11/2021 04:35 PM ? ? Modules accepted: Orders ? ?

## 2021-07-11 NOTE — Progress Notes (Signed)
? ? ? ?Clinical Summary ?[name] is a 71 y.o.female seen today for follow up of the following medical problems. ? ?1. CAD ?-history of MI in 2007, had PCI to LCX ? 08/2020 nuclear stress no ischemia ? 07/2020 echo: LVEF 55-60%, no WMAs, grade I dd, normal RV ? ?-chronic chest pains ?- pain midchest, pressure like pain. Can occur at rest or with activity. Not positional. Lasts 20-30 minutes. Can be better with prevacid but also NG. 2-3 epsidoes per week. ?-  ?  ?2. Chronic diastolic HF ?- increased swelling ?- compliant with lasix 40mg daily ?  ?3. OSA ? - was to establish with pulmionary ?  ?4. Afib ?- 10/2019 admitted to [facility] ?- UTI with sepsis, had some afib during that admission. No prior history.  ?- was started on eliquis at the time but Rx ran out, no longer taking.  ? 04/2020 30 day monitor: no arrhythmias ?Has not been committed to long term anticoag ?  ?  ?5. HTN ?- compliant with meds ?Past Medical History:  ?Diagnosis Date  ? Anxiety and depression   ? Arthritis   ? CAD (coronary artery disease)   ? Stent placement circumflex coronary 2007, catheterization 2008 patent stents. Normal LV function  ? Chest pain   ? CHF (congestive heart failure) ([facility])   ? COPD (chronic obstructive pulmonary disease) ([facility])   ? no home O2  ? Depression   ? Diabetes mellitus   ? Insulin dependent  ? Diabetic polyneuropathy ([facility])   ?  severe on multiple medications  ? Dyslipidemia   ? GERD (gastroesophageal reflux disease)   ? Headache(784.0)   ? Heart attack ([facility])   ? Heartburn   ? 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 High cholesterol   ? Hx of blood clots   ? Hypertension   ? MI (myocardial infarction) ([facility])   ? 2007  ? Obstructive sleep apnea   ? CPAP, setting ?2  ? Palpitations   ? Sleep apnea   ? Tachycardia   ? ? ? ?Allergies  ?Allergen Reactions  ? Amphetamine-Dextroamphetamine Swelling  ? Nitrofuran Derivatives Itching and Swelling  ? Amphetamine-Dextroamphet Er Swelling  ? Pregabalin Swelling  ? Topiramate Other  (See Comments)  ?  Tongue tingle  ? Verelan [Verapamil] Rash  ? ? ? ?Current Outpatient Medications  ?Medication Sig Dispense Refill  ? ALPRAZolam (XANAX) 0.5 MG tablet Take 0.5 mg by mouth 2 (two) times daily as needed for anxiety or sleep.    ? Armodafinil 150 MG tablet Take 150 mg by mouth daily.    ? aspirin EC 81 MG tablet Take 1 tablet (81 mg total) by mouth daily.    ? atorvastatin (LIPITOR) 40 MG tablet Take 40 mg by mouth daily.    ? B-D ULTRAFINE III SHORT PEN 31G X 8 MM MISC Inject into the skin 4 (four) times daily.    ? Blood Glucose Monitoring Suppl (ACCU-CHEK GUIDE ME) w/Device KIT 1 Piece by Does not apply route as directed. 1 kit 0  ? buPROPion (WELLBUTRIN SR) 100 MG 12 hr tablet Take 100 mg by mouth daily.    ? Cholecalciferol (VITAMIN D3) 125 MCG (5000 UT) CAPS TAKE ONE CAPSULE BY MOUTH DAILY (Patient taking differently: Take 5,000 Units by mouth daily.) 90 capsule 0  ? diclofenac Sodium (VOLTAREN) 1 % GEL APPLY TO BOTH KNEES THREE TIMES DAILY (Patient taking differently: Apply 2 g topically in the morning, at noon, and at bedtime.) 300   g 2  ? diltiazem (CARDIZEM CD) 240 MG 24 hr capsule Take 240 mg by mouth daily.    ? doxepin (SINEQUAN) 25 MG capsule Take 75 mg by mouth at bedtime.     ? estradiol (ESTRACE) 0.5 MG tablet Take 0.5 mg by mouth daily.     ? FEROSUL 325 (65 Fe) MG tablet Take 325 mg by mouth every morning.    ? furosemide (LASIX) 40 MG tablet Take 1 tablet by mouth daily.    ? gabapentin (NEURONTIN) 400 MG capsule Take 400 mg by mouth 3 (three) times daily.  5  ? glucose blood (ACCU-CHEK GUIDE) test strip Use as instructed 150 each 2  ? Insulin Lispro Prot & Lispro (HUMALOG MIX 75/25 KWIKPEN) (75-25) 100 UNIT/ML Kwikpen Inject 60 Units into the skin 2 (two) times daily before a meal. 30 mL 2  ? lisinopril (ZESTRIL) 5 MG tablet TAKE 1 TABLET BY MOUTH DAILY 90 tablet 2  ? meloxicam (MOBIC) 7.5 MG tablet Take 7.5 mg by mouth in the morning and at bedtime.   4  ? metFORMIN (GLUCOPHAGE)  500 MG tablet TAKE 1 TABLET BY MOUTH TWICE DAILY (Patient taking differently: Take 500 mg by mouth 2 (two) times daily with a meal.) 60 tablet 3  ? methocarbamol (ROBAXIN) 500 MG tablet Take 500 mg by mouth 2 (two) times daily.    ? metoCLOPramide (REGLAN) 5 MG tablet Take 5 mg by mouth 3 (three) times daily as needed for nausea or vomiting.   1  ? metoprolol succinate (TOPROL-XL) 100 MG 24 hr tablet Take 100 mg by mouth daily. Take with or immediately following a meal.    ? montelukast (SINGULAIR) 10 MG tablet Take 10 mg by mouth at bedtime.     ? morphine (MS CONTIN) 30 MG 12 hr tablet Take 1 tablet (30 mg total) by mouth every 12 (twelve) hours. Do Not Fill Before 07/11/2021 60 tablet 0  ? nitroGLYCERIN (NITROSTAT) 0.4 MG SL tablet Place 1 tablet (0.4 mg total) under the tongue every 5 (five) minutes as needed for chest pain (up to 3 doses. If taking 3rd dose call 911). 25 tablet 3  ? ondansetron (ZOFRAN) 4 MG tablet Take 4 mg by mouth every other day.    ? pantoprazole (PROTONIX) 40 MG tablet TAKE 1 TABLET BY MOUTH TWICE DAILY BEFORE MEALS (Patient taking differently: Take 40 mg by mouth 2 (two) times daily before a meal.) 60 tablet 1  ? polyethylene glycol (MIRALAX / GLYCOLAX) 17 g packet Take 17 g by mouth daily as needed for moderate constipation.    ? potassium chloride (KLOR-CON) 10 MEQ tablet Take 10 mEq by mouth 2 (two) times daily.    ? PROAIR HFA 108 (90 Base) MCG/ACT inhaler Inhale 2 puffs into the lungs every 4 (four) hours as needed for wheezing or shortness of breath.   8  ? promethazine (PHENERGAN) 25 MG tablet Take 1 tablet (25 mg total) by mouth 4 (four) times daily as needed. (Patient taking differently: Take 25 mg by mouth 4 (four) times daily as needed for nausea or vomiting.) 30 tablet 0  ? SURE COMFORT INS SYR 1CC/30G 30G X 5/16" 1 ML MISC 1 each by Other route daily.    ? topiramate (TOPAMAX) 200 MG tablet Take 200 mg by mouth at bedtime.  3  ? ?No current facility-administered medications  for this visit.  ? ? ? ?Past Surgical History:  ?Procedure Laterality Date  ? ABDOMINAL HYSTERECTOMY    ?  Arm Surgery Bilateral   ? due to fall-pt broke both forearms  ? BIOPSY N/A 07/01/2013  ? Procedure: BIOPSY;  Surgeon: Najeeb U Rehman, MD;  [facility];  Service: Endoscopy;  Laterality: N/A;  ? CHOLECYSTECTOMY    ? COLONOSCOPY  08/06/2011  ? Procedure: COLONOSCOPY;  Surgeon: Najeeb U Rehman, MD;  [facility];  Service: Endoscopy;  Laterality: N/A;  215  ? COLONOSCOPY WITH PROPOFOL N/A 12/31/2012  ? Procedure: COLONOSCOPY WITH PROPOFOL;  Surgeon: Najeeb U Rehman, MD;  Location: [facility];  Service: Endoscopy;  Laterality: N/A;  in cecum at 0814, total withdrawal time 11min  ? COLONOSCOPY WITH PROPOFOL N/A 02/05/2018  ? Procedure: COLONOSCOPY WITH PROPOFOL;  Surgeon: Rehman, Najeeb U, MD;  [facility];  Service: Endoscopy;  Laterality: N/A;  1:25  ? CORONARY ANGIOPLASTY WITH STENT PLACEMENT    ? CYSTOSCOPY W/ URETERAL STENT PLACEMENT Right 11/10/2019  ? at [facility]  ? CYSTOSCOPY WITH RETROGRADE PYELOGRAM, URETEROSCOPY AND STENT PLACEMENT Right 12/22/2019  ? Procedure: CYSTOSCOPY WITH RIGHT URETERAL STENT REMOVAL; RIGHT RETROGRADE PYELOGRAM,RIGHT URETEROSCOPY;  Surgeon: McKenzie, Patrick L, MD;  Location: [facility];  Service: Urology;  Laterality: Right;  ? ESOPHAGEAL DILATION N/A 06/07/2015  ? Procedure: ESOPHAGEAL DILATION;  Surgeon: Najeeb U Rehman, MD;  [facility];  Service: Endoscopy;  Laterality: N/A;  ? ESOPHAGOGASTRODUODENOSCOPY N/A 06/07/2015  ? Procedure: ESOPHAGOGASTRODUODENOSCOPY (EGD);  Surgeon: Najeeb U Rehman, MD;  [facility];  Service: Endoscopy;  Laterality: N/A;  2:45 - moved to 1:00 - Ann to notify pt  ? ESOPHAGOGASTRODUODENOSCOPY (EGD) WITH PROPOFOL N/A 12/31/2012  ? Procedure: ESOPHAGOGASTRODUODENOSCOPY (EGD) WITH PROPOFOL;  Surgeon: Najeeb U Rehman, MD;  [facility]: [facility];  Service: Endoscopy;  Laterality: N/A;  GE junction 37,  ?  ESOPHAGOGASTRODUODENOSCOPY (EGD) WITH PROPOFOL N/A 07/01/2013  ? Procedure: ESOPHAGOGASTRODUODENOSCOPY (EGD) WITH PROPOFOL;  Surgeon: Najeeb U Rehman, MD;  Location: [facility];  Service: Endoscopy;  Laterality: N/A;  950  ? MALON

## 2021-07-11 NOTE — Patient Instructions (Addendum)
Medication Instructions:  ?Begin Imdur 10m daily ?Increase Lasix to 614mdaily ?Continue all other medications.    ? ?Labwork: ?BMET, BNP, MG - orders given today ?Please do in 2 weeks (around 07/25/21) ?Office will contact with results via phone or letter.    ? ?Testing/Procedures: ?none ? ?Follow-Up: ?4 months  ? ?Any Other Special Instructions Will Be Listed Below (If Applicable). ?You have been referred to:  Pulmonary  ? ?If you need a refill on your cardiac medications before your next appointment, please call your pharmacy. ? ?

## 2021-07-12 ENCOUNTER — Telehealth: Payer: Self-pay | Admitting: "Endocrinology

## 2021-07-12 NOTE — Telephone Encounter (Signed)
Readings:Per the aid, she leaves at 4 pm and Delorice is not checking her sugar after that. ? ? ?4/24- 168 in the morning, 284 at supper ?4/25- 167 in the morning, did not check at supper or bedtime ?4/26 -279 in the morning, did not check at supper or bedtime ?4/27-244 in the morning, did not check at supper or bedtime ?4/28- 435 this morning ? ?Please call Aid [phone] ? ?Taking 60 units  ?

## 2021-07-15 NOTE — Telephone Encounter (Signed)
I called her aid and she said she no longer is her aid. I called pt and left a VM to call back to give instructions ?

## 2021-07-16 ENCOUNTER — Ambulatory Visit: Payer: 59 | Admitting: "Endocrinology

## 2021-08-09 ENCOUNTER — Encounter: Payer: Self-pay | Admitting: Registered Nurse

## 2021-08-09 ENCOUNTER — Encounter: Payer: 59 | Attending: Registered Nurse | Admitting: Registered Nurse

## 2021-08-09 VITALS — BP 110/68 | HR 90 | Ht 64.0 in | Wt 248.4 lb

## 2021-08-09 DIAGNOSIS — M1712 Unilateral primary osteoarthritis, left knee: Secondary | ICD-10-CM | POA: Diagnosis present

## 2021-08-09 DIAGNOSIS — G894 Chronic pain syndrome: Secondary | ICD-10-CM | POA: Insufficient documentation

## 2021-08-09 DIAGNOSIS — M7061 Trochanteric bursitis, right hip: Secondary | ICD-10-CM

## 2021-08-09 DIAGNOSIS — Z5181 Encounter for therapeutic drug level monitoring: Secondary | ICD-10-CM | POA: Diagnosis present

## 2021-08-09 DIAGNOSIS — M546 Pain in thoracic spine: Secondary | ICD-10-CM

## 2021-08-09 DIAGNOSIS — M48062 Spinal stenosis, lumbar region with neurogenic claudication: Secondary | ICD-10-CM | POA: Insufficient documentation

## 2021-08-09 DIAGNOSIS — M1711 Unilateral primary osteoarthritis, right knee: Secondary | ICD-10-CM | POA: Diagnosis present

## 2021-08-09 DIAGNOSIS — M542 Cervicalgia: Secondary | ICD-10-CM | POA: Diagnosis present

## 2021-08-09 DIAGNOSIS — M5412 Radiculopathy, cervical region: Secondary | ICD-10-CM | POA: Insufficient documentation

## 2021-08-09 DIAGNOSIS — M7062 Trochanteric bursitis, left hip: Secondary | ICD-10-CM | POA: Diagnosis present

## 2021-08-09 DIAGNOSIS — M5416 Radiculopathy, lumbar region: Secondary | ICD-10-CM | POA: Diagnosis present

## 2021-08-09 DIAGNOSIS — Z79891 Long term (current) use of opiate analgesic: Secondary | ICD-10-CM | POA: Diagnosis present

## 2021-08-09 MED ORDER — MORPHINE SULFATE ER 30 MG PO TBCR: 30.0000 mg | EXTENDED_RELEASE_TABLET | Freq: Two times a day (BID) | ORAL | 0 refills | Status: DC

## 2021-08-09 NOTE — Progress Notes (Unsigned)
Subjective:    Patient ID: [name], female    DOB: Aug 29, 1950, 71 y.o.   MRN: [id number]  HPI: [name] is a 71 y.o. female who returns for follow up appointment for chronic pain and medication refill. states *** pain is located in  ***. rates pain ***. current exercise regime is walking and performing stretching exercises.  [name] Morphine equivalent is *** MME.   Last Oral Swab was Performed on 05/16/2021, it was consistent.    Pain Inventory Average Pain 10 Pain Right Now 10 My pain is sharp, burning, dull, stabbing, and aching  In the last 24 hours, has pain interfered with the following? General activity 10 Relation with others 10 Enjoyment of life 10 What TIME of day is your pain at its worst? morning , daytime, evening, and night Sleep (in general) Poor  Pain is worse with: walking, bending, sitting, inactivity, standing, unsure, and some activites Pain improves with: rest, heat/ice, therapy/exercise, and pacing activities Relief from Meds: 0  Family History  Problem Relation Age of Onset   Heart attack Mother    Colon cancer Mother    Heart attack Father    Stroke Sister    Hemophilia Child    Cancer Other    Coronary artery disease Other    Cancer Brother    Cancer Maternal Aunt    Cancer Maternal Uncle    Cancer Paternal Aunt    Cancer Paternal Uncle    Cancer Brother    Social History   Socioeconomic History   Marital status: Divorced    Spouse name: Not on file   Number of children: 4   Years of education: Not on file   Highest education level: Not on file  Occupational History   Occupation: Disabled  Tobacco Use   Smoking status: Never   Smokeless tobacco: Never  Vaping Use   Vaping Use: Never used  Substance and Sexual Activity   Alcohol use: No    Alcohol/week: 0.0 standard drinks   Drug use: No   Sexual activity: Not on file  Other Topics Concern   Not on file  Social History Narrative   Patient does not get regular  exercise   Denies caffeine use    Social Determinants of Health   Financial Resource Strain: Not on file  Food Insecurity: Not on file  Transportation Needs: Not on file  Physical Activity: Not on file  Stress: Not on file  Social Connections: Not on file   Past Surgical History:  Procedure Laterality Date   ABDOMINAL HYSTERECTOMY     Arm Surgery Bilateral    due to fall-pt broke both forearms   BIOPSY N/A 07/01/2013   Procedure: BIOPSY;  Surgeon: Rogene Houston, MD;  [facility]: [facility];  Service: Endoscopy;  Laterality: N/A;   CHOLECYSTECTOMY     COLONOSCOPY  08/06/2011   Procedure: COLONOSCOPY;  Surgeon: Rogene Houston, MD;  [facility];  Service: Endoscopy;  Laterality: N/A;  215   COLONOSCOPY WITH PROPOFOL N/A 12/31/2012   Procedure: COLONOSCOPY WITH PROPOFOL;  Surgeon: Rogene Houston, MD;  Location: [facility];  Service: Endoscopy;  Laterality: N/A;  in cecum at 0814, total withdrawal time 64mn   COLONOSCOPY WITH PROPOFOL N/A 02/05/2018   Procedure: COLONOSCOPY WITH PROPOFOL;  Surgeon: RRogene Houston MD;  [facility];  Service: Endoscopy;  Laterality: N/A;  1:25   CORONARY ANGIOPLASTY WITH STENT PLACEMENT     CYSTOSCOPY W/  URETERAL STENT PLACEMENT Right 11/10/2019   at [place], URETEROSCOPY AND STENT PLACEMENT Right 12/22/2019   Procedure: CYSTOSCOPY WITH RIGHT URETERAL STENT REMOVAL; RIGHT RETROGRADE PYELOGRAM,RIGHT URETEROSCOPY;  Surgeon: Cleon Gustin, MD;  Location: [facility];  Service: Urology;  Laterality: Right;   ESOPHAGEAL DILATION N/A 06/07/2015   Procedure: ESOPHAGEAL DILATION;  Surgeon: Rogene Houston, MD;  [facility];  Service: Endoscopy;  Laterality: N/A;   ESOPHAGOGASTRODUODENOSCOPY N/A 06/07/2015   Procedure: ESOPHAGOGASTRODUODENOSCOPY (EGD);  Surgeon: Rogene Houston, MD;  [facility];  Service: Endoscopy;  Laterality: N/A;  2:45 - moved to 1:00 - Ann to notify pt    ESOPHAGOGASTRODUODENOSCOPY (EGD) WITH PROPOFOL N/A 12/31/2012   Procedure: ESOPHAGOGASTRODUODENOSCOPY (EGD) WITH PROPOFOL;  Surgeon: Rogene Houston, MD;  [facility]: [facility];  Service: Endoscopy;  Laterality: N/A;  GE junction 37,   ESOPHAGOGASTRODUODENOSCOPY (EGD) WITH PROPOFOL N/A 07/01/2013   Procedure: ESOPHAGOGASTRODUODENOSCOPY (EGD) WITH PROPOFOL;  Surgeon: Rogene Houston, MD;  [facility];  Service: Endoscopy;  Laterality: N/A;  [place] N/A 12/31/2012   Procedure: MALONEY DILATION;  Surgeon: Rogene Houston, MD;  [facility];  Service: Endoscopy;  Laterality: N/A;  used a # 54,#56   MALONEY DILATION N/A 07/01/2013   Procedure: MALONEY DILATION;  Surgeon: Rogene Houston, MD;  [facility]: [facility];  Service: Endoscopy;  Laterality: N/A;  54/58; no heme present   POLYPECTOMY  02/05/2018   Procedure: POLYPECTOMY;  Surgeon: Rogene Houston, MD;  [facility];  Service: Endoscopy;;  distal rectum (HS)   STONE EXTRACTION WITH BASKET Right 12/22/2019   Procedure: STONE EXTRACTION WITH BASKET;  Surgeon: Cleon Gustin, MD;  [facility]: [facility];  Service: Urology;  Laterality: Right;   Past Surgical History:  Procedure Laterality Date   ABDOMINAL HYSTERECTOMY     Arm Surgery Bilateral    due to fall-pt broke both forearms   BIOPSY N/A 07/01/2013   Procedure: BIOPSY;  Surgeon: Rogene Houston, MD;  [facility];  Service: Endoscopy;  Laterality: N/A;   CHOLECYSTECTOMY     COLONOSCOPY  08/06/2011   Procedure: COLONOSCOPY;  Surgeon: Rogene Houston, MD;  [facility];  Service: Endoscopy;  Laterality: N/A;  215   COLONOSCOPY WITH PROPOFOL N/A 12/31/2012   Procedure: COLONOSCOPY WITH PROPOFOL;  Surgeon: Rogene Houston, MD;  [facility];  Service: Endoscopy;  Laterality: N/A;  in cecum at 0814, total withdrawal time 38mn   COLONOSCOPY WITH PROPOFOL N/A 02/05/2018   Procedure: COLONOSCOPY WITH PROPOFOL;  Surgeon: RRogene Houston MD;  [facility];  Service: Endoscopy;  Laterality: N/A;  1:25   CORONARY ANGIOPLASTY WITH STENT PLACEMENT     CYSTOSCOPY W/ URETERAL STENT PLACEMENT Right 11/10/2019   at [facility] URETEROSCOPY AND STENT PLACEMENT Right 12/22/2019   Procedure: CYSTOSCOPY WITH RIGHT URETERAL STENT REMOVAL; RIGHT RETROGRADE PYELOGRAM,RIGHT URETEROSCOPY;  Surgeon: MCleon Gustin MD;  [facility];  Service: Urology;  Laterality: Right;   ESOPHAGEAL DILATION N/A 06/07/2015   Procedure: ESOPHAGEAL DILATION;  Surgeon: NRogene Houston MD;  [facility];  Service: Endoscopy;  Laterality: N/A;   ESOPHAGOGASTRODUODENOSCOPY N/A 06/07/2015   Procedure: ESOPHAGOGASTRODUODENOSCOPY (EGD);  Surgeon: NRogene Houston MD;  [facility];  Service: Endoscopy;  Laterality: N/A;  2:45 - moved to 1:00 - Ann to notify pt   ESOPHAGOGASTRODUODENOSCOPY (EGD) WITH PROPOFOL  N/A 12/31/2012   Procedure: ESOPHAGOGASTRODUODENOSCOPY (EGD) WITH PROPOFOL;  Surgeon: Rogene Houston, MD;  [facility];  Service: Endoscopy;  Laterality: N/A;  GE junction 37,   ESOPHAGOGASTRODUODENOSCOPY (EGD) WITH PROPOFOL N/A 07/01/2013   Procedure: ESOPHAGOGASTRODUODENOSCOPY (EGD) WITH PROPOFOL;  Surgeon: Rogene Houston, MD;  [facility];  Service: Endoscopy;  Laterality: N/A;  [place] N/A 12/31/2012   Procedure: MALONEY DILATION;  Surgeon: Rogene Houston, MD;  [facility];  Service: Endoscopy;  Laterality: N/A;  used a # 54,#56   MALONEY DILATION N/A 07/01/2013   Procedure: MALONEY DILATION;  Surgeon: Rogene Houston, MD;  [facility];  Service: Endoscopy;  Laterality: N/A;  54/58; no heme present   POLYPECTOMY  02/05/2018   Procedure: POLYPECTOMY;  Surgeon: Rogene Houston, MD;  [facility];  Service: Endoscopy;;  distal rectum (HS)   STONE EXTRACTION WITH BASKET Right 12/22/2019   Procedure: STONE EXTRACTION WITH BASKET;  Surgeon: Cleon Gustin, MD;   [facility];  Service: Urology;  Laterality: Right;   Past Medical History:  Diagnosis Date   Anxiety and depression    Arthritis    CAD (coronary artery disease)    Stent placement circumflex coronary 2007, catheterization 2008 patent stents. 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    High cholesterol    Hx of blood clots    Hypertension    MI (myocardial infarction) ([place])    2007   Obstructive sleep apnea    CPAP, setting ?2   Palpitations    Sleep apnea    Tachycardia    BP 110/68   Pulse 90   Ht 5' 4"  (1.626 m)   Wt 248 lb 6.4 oz (112.7 kg)   SpO2 (!) 88%   BMI 42.64 kg/m   Opioid Risk Score:   Fall Risk Score:  `1  Depression screen PHQ 2/9     05/16/2021    1:15 PM 10/11/2020    1:04 PM 01/24/2020    1:19 PM 11/18/2019   10:36 AM 10/26/2019    1:03 PM 09/26/2019    2:12 PM 03/24/2019    1:22 PM  Depression screen PHQ 2/9  Decreased Interest 0 1 0 1 0 0 1  Down, Depressed, Hopeless 3  0 1 0 0 1  PHQ - 2 Score 3 1 0 2 0 0 2      Review of Systems  Musculoskeletal:  Positive for back pain.       Bilateral knee pain Bilateral foot pain  All other systems reviewed and are negative.     Objective:   Physical Exam        Assessment & Plan:  1.Lumbar pain lumbar spondylosis:  Encouraged to continue to increase Activity as tolerated. 07/02/2021 Continue current medication regimen.  Refilled: MS Contin 30 MG one tablet every 12 hours #60. Boyfriend Joe is dispensing [name] Medication she reports. We will continue the opioid monitoring program, this consists of regular clinic visits,  examinations, urine drug screen, pill counts as well as use of [place] Controlled Substance Reporting system. A 12 month History has been reviewed on the [facility] on 07/02/2021.  2. Lumbar Radiculitis: Continue current medication regimen with Gabapentin. 07/02/2021 3. Bilateral Knee Pain/ Degenerative: Continue Voltaren Gel. Continue to Monitor. 07/02/2021 4. Severe diabetic poly neuropathy: Continue current medication regimen with Gabapentin. 07/02/2021 5. Insomnia: Continue Armodafinil Psychiatry Following. 07/02/2021 6.Anxiety/Depression: Continue Current Medication regimen as prescribed by PCP and   Psychiatry. 07/02/2021 7. De Quervains Tenosynovitis: No Complaints Today. 07/02/2021 8. Bilateral  Greater Trochanteric Bursitis: Continue with Ice and Heat Therapy. Continue to Monitor. 07/02/2021.  9. Cervicalgia/ Cervical Radiculitis:Continue Gabapentin. Continue with HEP as Tolerated and continue to monitor. 07/02/2021 10. Chronic Bilateral Thoracic Back Pain: Continue HEP as Tolerated. Continue current medication regimen. Continue to monitor. 07/02/2021.  11. Onycholysis: Left Great Toe: She has a scheduled F/U appointment with her Podiatrist, she states.    F/U in 1 month

## 2021-08-29 ENCOUNTER — Ambulatory Visit (INDEPENDENT_AMBULATORY_CARE_PROVIDER_SITE_OTHER): Payer: 59 | Admitting: Gastroenterology

## 2021-09-02 ENCOUNTER — Ambulatory Visit (INDEPENDENT_AMBULATORY_CARE_PROVIDER_SITE_OTHER): Payer: 59 | Admitting: Gastroenterology

## 2021-09-03 ENCOUNTER — Encounter (INDEPENDENT_AMBULATORY_CARE_PROVIDER_SITE_OTHER): Payer: Self-pay | Admitting: *Deleted

## 2021-09-03 ENCOUNTER — Encounter: Payer: Self-pay | Admitting: "Endocrinology

## 2021-09-03 ENCOUNTER — Ambulatory Visit (INDEPENDENT_AMBULATORY_CARE_PROVIDER_SITE_OTHER): Payer: 59 | Admitting: "Endocrinology

## 2021-09-03 VITALS — BP 114/62 | HR 88 | Ht 64.0 in | Wt 246.0 lb

## 2021-09-03 DIAGNOSIS — E1159 Type 2 diabetes mellitus with other circulatory complications: Secondary | ICD-10-CM | POA: Diagnosis not present

## 2021-09-03 DIAGNOSIS — I10 Essential (primary) hypertension: Secondary | ICD-10-CM | POA: Diagnosis not present

## 2021-09-03 DIAGNOSIS — E559 Vitamin D deficiency, unspecified: Secondary | ICD-10-CM

## 2021-09-03 DIAGNOSIS — E782 Mixed hyperlipidemia: Secondary | ICD-10-CM | POA: Diagnosis not present

## 2021-09-03 LAB — POCT GLYCOSYLATED HEMOGLOBIN (HGB A1C): HbA1c, POC (controlled diabetic range): 9.9 % — AB (ref 0.0–7.0)

## 2021-09-03 MED ORDER — INSULIN LISPRO PROT & LISPRO (75-25 MIX) 100 UNIT/ML KWIKPEN: 70.0000 [IU] | PEN_INJECTOR | Freq: Two times a day (BID) | SUBCUTANEOUS | 2 refills | Status: DC

## 2021-09-03 NOTE — Patient Instructions (Signed)

## 2021-09-03 NOTE — Progress Notes (Signed)
09/03/2021  Endocrinology follow-up note   Subjective:    Patient ID: [name], female    DOB: 1950/10/25. Patient is returning with incomplete log for glycemic profile.  Her documented readings and her meter average history significant above Target.  Past Medical History:  Diagnosis Date   Anxiety and depression    Arthritis    CAD (coronary artery disease)    Stent placement circumflex coronary 2007, catheterization 2008 patent stents. 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    High cholesterol    Hx of blood clots    Hypertension    MI (myocardial infarction) ([place])    2007   Obstructive sleep apnea    CPAP, setting ?2   Palpitations    Sleep apnea    Tachycardia    Past Surgical History:  Procedure Laterality Date   ABDOMINAL HYSTERECTOMY     Arm Surgery Bilateral    due to fall-pt broke both forearms   BIOPSY N/A 07/01/2013   Procedure: BIOPSY;  Surgeon: Rogene Houston, MD;  Location: [facility];  Service: Endoscopy;  Laterality: N/A;   CHOLECYSTECTOMY     COLONOSCOPY  08/06/2011   Procedure: COLONOSCOPY;  Surgeon: Rogene Houston, MD;  [facility];  Service: Endoscopy;  Laterality: N/A;  215   COLONOSCOPY WITH PROPOFOL N/A 12/31/2012   Procedure: COLONOSCOPY WITH PROPOFOL;  Surgeon: Rogene Houston, MD;  Location: [facility];  Service: Endoscopy;  Laterality: N/A;  in cecum at 0814, total withdrawal time 30mn   COLONOSCOPY WITH PROPOFOL N/A 02/05/2018   Procedure: COLONOSCOPY WITH PROPOFOL;  Surgeon: RRogene Houston MD;  [facility];  Service: Endoscopy;  Laterality: N/A;  1:25    CORONARY ANGIOPLASTY WITH STENT PLACEMENT     CYSTOSCOPY W/ URETERAL STENT PLACEMENT Right 11/10/2019   at [facility] URETEROSCOPY AND STENT PLACEMENT Right 12/22/2019   Procedure: CYSTOSCOPY WITH RIGHT URETERAL STENT REMOVAL; RIGHT RETROGRADE PYELOGRAM,RIGHT URETEROSCOPY;  Surgeon: MCleon Gustin MD;  Location: [facility];  Service: Urology;  Laterality: Right;   ESOPHAGEAL DILATION N/A 06/07/2015   Procedure: ESOPHAGEAL DILATION;  Surgeon: NRogene Houston MD;  [facility];  Service: Endoscopy;  Laterality: N/A;   ESOPHAGOGASTRODUODENOSCOPY N/A 06/07/2015   Procedure: ESOPHAGOGASTRODUODENOSCOPY (EGD);  Surgeon: NRogene Houston MD;  [facility];  Service: Endoscopy;  Laterality: N/A;  2:45 - moved to 1:00 - Ann to notify pt   ESOPHAGOGASTRODUODENOSCOPY (EGD) WITH PROPOFOL N/A 12/31/2012   Procedure: ESOPHAGOGASTRODUODENOSCOPY (EGD) WITH PROPOFOL;  Surgeon: NRogene Houston MD;  [facility]: [facility];  Service: Endoscopy;  Laterality: N/A;  GE junction 37,   ESOPHAGOGASTRODUODENOSCOPY (EGD) WITH PROPOFOL N/A 07/01/2013   Procedure: ESOPHAGOGASTRODUODENOSCOPY (EGD) WITH PROPOFOL;  Surgeon: NRogene Houston MD;  [facility];  Service: Endoscopy;  Laterality: N/A;  [id number] 12/31/2012   Procedure: MALONEY DILATION;  Surgeon: NRogene Houston MD;  Location: [facility];  Service: Endoscopy;  Laterality: N/A;  used  a # 54,#56   MALONEY DILATION N/A 07/01/2013   Procedure: MALONEY DILATION;  Surgeon: Rogene Houston, MD;  Location: [facility];  Service: Endoscopy;  Laterality: N/A;  54/58; no heme present   POLYPECTOMY  02/05/2018   Procedure: POLYPECTOMY;  Surgeon: Rogene Houston, MD;  [facility];  Service: Endoscopy;;  distal rectum (HS)   STONE EXTRACTION WITH BASKET Right 12/22/2019   Procedure: STONE EXTRACTION WITH BASKET;  Surgeon: Cleon Gustin, MD;  Location: [facility];  Service: Urology;  Laterality: Right;    Social History   Socioeconomic History   Marital status: Divorced    Spouse name: Not on file   Number of children: 4   Years of education: Not on file   Highest education level: Not on file  Occupational History   Occupation: Disabled  Tobacco Use   Smoking status: Never   Smokeless tobacco: Never  Vaping Use   Vaping Use: Never used  Substance and Sexual Activity   Alcohol use: No    Alcohol/week: 0.0 standard drinks of alcohol   Drug use: No   Sexual activity: Not on file  Other Topics Concern   Not on file  Social History Narrative   Patient does not get regular exercise   Denies caffeine use    Social Determinants of Health   Financial Resource Strain: Not on file  Food Insecurity: Not on file  Transportation Needs: Not on file  Physical Activity: Not on file  Stress: Not on file  Social Connections: Not on file   Outpatient Encounter Medications as of 09/03/2021  Medication Sig   ALPRAZolam (XANAX) 0.5 MG tablet Take 0.5 mg by mouth 2 (two) times daily as needed for anxiety or sleep.   Armodafinil 150 MG tablet Take 150 mg by mouth daily.   aspirin EC 81 MG tablet Take 1 tablet (81 mg total) by mouth daily.   atorvastatin (LIPITOR) 40 MG tablet Take 40 mg by mouth daily.   B-D ULTRAFINE III SHORT PEN 31G X 8 MM MISC Inject into the skin 4 (four) times daily.   Blood Glucose Monitoring Suppl (ACCU-CHEK GUIDE ME) w/Device KIT 1 Piece by Does not apply route as directed.   buPROPion (WELLBUTRIN SR) 100 MG 12 hr tablet Take 100 mg by mouth daily.   Cholecalciferol (VITAMIN D3) 125 MCG (5000 UT) CAPS TAKE ONE CAPSULE BY MOUTH DAILY (Patient taking differently: Take 5,000 Units by mouth daily.)   diclofenac Sodium (VOLTAREN) 1 % GEL APPLY TO BOTH KNEES THREE TIMES DAILY (Patient taking differently: Apply 2 g topically in the morning, at noon, and at bedtime.)   diltiazem (CARDIZEM CD) 240 MG 24 hr capsule Take 240 mg by mouth daily.   doxepin (SINEQUAN) 25 MG capsule  Take 75 mg by mouth at bedtime.    estradiol (ESTRACE) 0.5 MG tablet Take 0.5 mg by mouth daily.    FEROSUL 325 (65 Fe) MG tablet Take 325 mg by mouth every morning.   furosemide (LASIX) 40 MG tablet Take 1.5 tablets (60 mg total) by mouth daily.   gabapentin (NEURONTIN) 400 MG capsule Take 400 mg by mouth 3 (three) times daily.   glucose blood (ACCU-CHEK GUIDE) test strip Use as instructed   Insulin Lispro Prot & Lispro (HUMALOG MIX 75/25 KWIKPEN) (75-25) 100 UNIT/ML Kwikpen Inject 70 Units into the skin 2 (two) times daily before a meal.   isosorbide mononitrate (IMDUR) 30 MG 24 hr tablet Take 0.5 tablets (15 mg total)  by mouth daily.   lisinopril (ZESTRIL) 5 MG tablet TAKE 1 TABLET BY MOUTH DAILY   meloxicam (MOBIC) 7.5 MG tablet Take 7.5 mg by mouth in the morning and at bedtime.    metFORMIN (GLUCOPHAGE) 500 MG tablet TAKE 1 TABLET BY MOUTH TWICE DAILY (Patient taking differently: Take 1,000 mg by mouth 2 (two) times daily with a meal.)   methocarbamol (ROBAXIN) 500 MG tablet Take 500 mg by mouth 2 (two) times daily.   metoCLOPramide (REGLAN) 5 MG tablet Take 5 mg by mouth 3 (three) times daily as needed for nausea or vomiting.    metoprolol succinate (TOPROL-XL) 100 MG 24 hr tablet Take 100 mg by mouth daily. Take with or immediately following a meal.   montelukast (SINGULAIR) 10 MG tablet Take 10 mg by mouth at bedtime.    morphine (MS CONTIN) 30 MG 12 hr tablet Take 1 tablet (30 mg total) by mouth every 12 (twelve) hours. Do Not Fill Before 09/05/2021   nitroGLYCERIN (NITROSTAT) 0.4 MG SL tablet Place 1 tablet (0.4 mg total) under the tongue every 5 (five) minutes as needed for chest pain (up to 3 doses. If taking 3rd dose call 911).   ondansetron (ZOFRAN) 4 MG tablet Take 4 mg by mouth every other day.   pantoprazole (PROTONIX) 40 MG tablet TAKE 1 TABLET BY MOUTH TWICE DAILY BEFORE MEALS (Patient taking differently: Take 40 mg by mouth 2 (two) times daily before a meal.)   polyethylene  glycol (MIRALAX / GLYCOLAX) 17 g packet Take 17 g by mouth daily as needed for moderate constipation.   potassium chloride (KLOR-CON) 10 MEQ tablet Take 10 mEq by mouth 2 (two) times daily.   PROAIR HFA 108 (90 Base) MCG/ACT inhaler Inhale 2 puffs into the lungs every 4 (four) hours as needed for wheezing or shortness of breath.    promethazine (PHENERGAN) 25 MG tablet Take 1 tablet (25 mg total) by mouth 4 (four) times daily as needed. (Patient taking differently: Take 25 mg by mouth 4 (four) times daily as needed for nausea or vomiting.)   SURE COMFORT INS SYR 1CC/30G 30G X 5/16" 1 ML MISC 1 each by Other route daily.   topiramate (TOPAMAX) 200 MG tablet Take 200 mg by mouth at bedtime.   [DISCONTINUED] Insulin Lispro Prot & Lispro (HUMALOG MIX 75/25 KWIKPEN) (75-25) 100 UNIT/ML Kwikpen Inject 60 Units into the skin 2 (two) times daily before a meal. (Patient taking differently: Inject 62 Units into the skin 2 (two) times daily before a meal.)   No facility-administered encounter medications on file as of 09/03/2021.   ALLERGIES: Allergies  Allergen Reactions   Amphetamine-Dextroamphetamine Swelling   Nitrofuran Derivatives Itching and Swelling   Amphetamine-Dextroamphet Er Swelling   Pregabalin Swelling   Topiramate Other (See Comments)    Tongue tingle   Verelan [Verapamil] Rash   VACCINATION STATUS:  There is no immunization history on file for this patient.  Diabetes She presents for her follow-up diabetic visit. She has type 2 diabetes mellitus. Onset time: She was diagnosed at approximate age of 54 years. Her disease course has been worsening. There are no hypoglycemic associated symptoms. Pertinent negatives for hypoglycemia include no confusion, headaches, pallor or seizures. Pertinent negatives for diabetes include no chest pain, no fatigue, no polydipsia, no polyphagia and no polyuria. There are no hypoglycemic complications. Symptoms are worsening. Diabetic complications include  autonomic neuropathy, heart disease, peripheral neuropathy, PVD and retinopathy. Risk factors for coronary artery disease include dyslipidemia, diabetes mellitus,  obesity, hypertension and sedentary lifestyle. Current diabetic treatments: Lantus 65 units daily at bedtime, Humalog 15 units 3 times a day before meals. She mentions allergy to Victoza, Byetta, and metformin. Her weight is fluctuating minimally. She is following a generally unhealthy diet. When asked about meal planning, she reported none. She has not had a previous visit with a dietitian. She never participates in exercise. Her home blood glucose trend is increasing steadily. Her breakfast blood glucose range is generally >200 mg/dl. Her dinner blood glucose range is generally >200 mg/dl. Her bedtime blood glucose range is generally >200 mg/dl. Her overall blood glucose range is >200 mg/dl. (She was recently switched to Humalog 75/25 due to the fact that she could not connect multiple daily injections of insulin to treat her diabetes.  She presents with persistent hyperglycemia.  Point-of-care A1c is 9.9%.  She is not documenting her activities completely.  No hypoglycemia documented.     ) An ACE inhibitor/angiotensin II receptor blocker is not being taken. Eye exam is current.  Hyperlipidemia This is a chronic problem. The current episode started more than 1 year ago. The problem is uncontrolled. Recent lipid tests were reviewed and are high. Exacerbating diseases include diabetes and obesity. Pertinent negatives include no chest pain, myalgias or shortness of breath. Current antihyperlipidemic treatment includes statins. Risk factors for coronary artery disease include diabetes mellitus, dyslipidemia, hypertension, obesity and a sedentary lifestyle.  Hypertension This is a chronic problem. The current episode started more than 1 year ago. Pertinent negatives include no chest pain, headaches, palpitations or shortness of breath. Risk factors  for coronary artery disease include diabetes mellitus, dyslipidemia, obesity and sedentary lifestyle. Hypertensive end-organ damage includes CAD/MI, PVD and retinopathy.    Review of systems: Limited as above.  Objective:    BP 114/62   Pulse 88   Ht 5' 4"  (1.626 m)   Wt 246 lb (111.6 kg)   BMI 42.23 kg/m   Wt Readings from Last 3 Encounters:  09/03/21 246 lb (111.6 kg)  08/09/21 248 lb 6.4 oz (112.7 kg)  07/11/21 250 lb (113.4 kg)     CMP     Component Value Date/Time   NA 140 03/21/2021 1139   K 5.4 (H) 03/21/2021 1139   CL 101 03/21/2021 1139   CO2 24 03/21/2021 1139   GLUCOSE 229 (H) 03/21/2021 1139   GLUCOSE 233 (H) 08/08/2020 0244   BUN 10 03/21/2021 1139   CREATININE 0.68 03/21/2021 1139   CREATININE 0.70 11/02/2018 1105   CALCIUM 9.1 03/21/2021 1139   PROT 6.1 03/21/2021 1139   ALBUMIN 3.3 (L) 03/21/2021 1139   AST 19 03/21/2021 1139   ALT 13 03/21/2021 1139   ALKPHOS 99 03/21/2021 1139   BILITOT 0.4 03/21/2021 1139   GFRNONAA >60 08/08/2020 0244   GFRNONAA 91 11/02/2018 1105   GFRAA >60 10/27/2019 1130   GFRAA 105 11/02/2018 1105   Diabetic Labs (most recent): Lab Results  Component Value Date   HGBA1C 9.9 (A) 09/03/2021   HGBA1C 9.1 (A) 04/18/2021   HGBA1C 10.1 (A) 11/27/2020   MICROALBUR <0.2 02/09/2017    Lipid Panel     Component Value Date/Time   CHOL 115 08/08/2020 0244   CHOL 127 07/05/2020 1338   TRIG 118 08/08/2020 0244   HDL 37 (L) 08/08/2020 0244   HDL 46 07/05/2020 1338   CHOLHDL 3.1 08/08/2020 0244   VLDL 24 08/08/2020 0244   LDLCALC 54 08/08/2020 0244   LDLCALC 43 07/05/2020 1338  [place] 90 07/06/2018 0936     Assessment & Plan:   1. Type 2 diabetes mellitus with vascular disease ([place]) - Patient has currently uncontrolled symptomatic type 2 DM since  71 years of age.  She was recently switched to Humalog 75/25 due to the fact that she could not connect multiple daily injections of insulin to treat her diabetes.  She  presents with persistent hyperglycemia.  Point-of-care A1c is 9.9%.  She is not documenting her activities completely.  No hypoglycemia documented.      Recent labs reviewed.  - Her diabetes is complicated by obesity/sedentary life, coronary artery disease status post stent placement and patient remains at a high risk for more acute and chronic complications of diabetes which include CAD, CVA, CKD, retinopathy, and neuropathy. These are all discussed in detail with the patient.  - I have counseled the patient on diet management and weight loss, by adopting a carbohydrate restricted/protein rich diet.  - she acknowledges that there is a room for improvement in her food and drink choices. - Suggestion is made for her to avoid simple carbohydrates  from her diet including Cakes, Sweet Desserts, Ice Cream, Soda (diet and regular), Sweet Tea, Candies, Chips, Cookies, Store Bought Juices, Alcohol , Artificial Sweeteners,  Coffee Creamer, and "Sugar-free" Products, Lemonade. This will help patient to have more stable blood glucose profile and potentially avoid unintended weight gain.  The following Lifestyle Medicine recommendations according to [place]  [facility]) were discussed and and offered to patient and she  agrees to start the journey:  A. Whole Foods, Plant-Based Nutrition comprising of fruits and vegetables, plant-based proteins, whole-grain carbohydrates was discussed in detail with the patient.   A list for source of those nutrients were also provided to the patient.  Patient will use only water or unsweetened tea for hydration. B.  The need to stay away from risky substances including alcohol, smoking; obtaining 7 to 9 hours of restorative sleep, at least 150 minutes of moderate intensity exercise weekly, the importance of healthy social connections,  and stress management techniques were discussed. C.  A full color page of  Calorie density of various food groups per  pound showing examples of each food groups was provided to the patient.  - Patient is advised to stick to a routine mealtimes to eat 3 meals  a day and avoid unnecessary snacks ( to snack only to correct hypoglycemia).   - I have approached patient with the following individualized plan to manage diabetes and patient agrees:   -In light of her presentation with significantly above target glycemic profile, she will need a higher dose of insulin in order for her to achieve control of diabetes to target.    Accordingly, I discussed and increase her Humalog 75/25 to 70 units with breakfast, 70 units with supper  only if glucose readings are above 90 and only if she is eating.    She is advised and encouraged to continue monitoring blood glucose 4 times a day-daily before 3 meals and at bedtime.   -She is warned not to take insulin without proper monitoring of blood glucose.  -Her insurance did not provide coverage for CGM device.  -She continues to have normal renal function, she will continue to benefit from low-dose metformin.  I discussed and continued metformin 500 mg p.o. twice daily.    -Patient is encouraged to call clinic for blood glucose levels less than 70 or above 200 mg /dl.  -  She has intolerance to GLP-1 receptor agonists.  2) BP/HTN: -Her blood pressure is controlled to target.  She has adequate medications, advised to continue including metoprolol XL 50 mg p.o. daily.  3) Lipids/HPL: She has uncontrolled hypertriglyceridemia , still high at 439  LDL better at 50, HDL at 77. She is advised to continue Lipitor 40 mg daily nightly.  She is educated on the fact that   better control of diabetes will help for triglycerides.  She is also educated on the link between her diet and her cholesterol levels.  She would benefit from WF PB diet in terms of her high cholesterol as well.   4) Chronic Care/Health Maintenance:  -Patient is on Statin medications and encouraged to continue to  follow up with Ophthalmology, Podiatrist at least yearly or according to recommendations, and advised to   stay away from smoking. I have recommended yearly flu vaccine and pneumonia vaccination at least every 5 years;  and  sleep for at least 7 hours a day. -She will benefit from a set of diabetic shoes, paperwork filled for her.   - I advised patient to maintain close follow up with Glenda Chroman, MD for primary care needs.  I spent 41 minutes in the care of the patient today including review of labs from [place], Lipids, Thyroid Function, Hematology (current and previous including abstractions from other facilities); face-to-face time discussing  her blood glucose readings/logs, discussing hypoglycemia and hyperglycemia episodes and symptoms, medications doses, her options of short and long term treatment based on the latest standards of care / guidelines;  discussion about incorporating lifestyle medicine;  and documenting the encounter.    Please refer to Patient Instructions for Blood Glucose Monitoring and Insulin/Medications Dosing Guide"  in media tab for additional information. Please  also refer to " Patient Self Inventory" in the Media  tab for reviewed elements of pertinent patient history.  [name] participated in the discussions, expressed understanding, and voiced agreement with the above plans.  All questions were answered to her satisfaction. she is encouraged to contact clinic should she have any questions or concerns prior to her return visit.   Follow up plan: - Patient to go to emergency room for better evaluation of chest pain. Return in about 5 weeks (around 10/08/2021) for F/U with Meter/CGM /Logs Only - no Labs.  Glade Lloyd, MD Phone: [phone]  Fax: [phone]  This note was partially dictated with voice recognition software. Similar sounding words can be transcribed inadequately or may not  be corrected upon review.  09/03/2021, 6:03 PM

## 2021-09-27 ENCOUNTER — Telehealth: Payer: Self-pay | Admitting: Registered Nurse
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
## 2021-09-27 NOTE — Telephone Encounter (Signed)
She has 21 pills remaining

## 2021-10-02 ENCOUNTER — Telehealth: Payer: Self-pay | Admitting: *Deleted

## 2021-10-02 NOTE — Telephone Encounter (Signed)
Placed a call to Ms. Scadden, no answer. Left message for  Ms. Gann to return the call.
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
## 2021-10-03 ENCOUNTER — Encounter: Payer: Medicare Other | Admitting: Registered Nurse

## 2021-10-07 ENCOUNTER — Telehealth: Payer: Self-pay

## 2021-10-07 NOTE — Telephone Encounter (Signed)
Patient is calling for a refill on MS Contin. Per PMP, last fill was 09/09/21

## 2021-10-08 ENCOUNTER — Ambulatory Visit (INDEPENDENT_AMBULATORY_CARE_PROVIDER_SITE_OTHER): Payer: 59 | Admitting: "Endocrinology

## 2021-10-08 ENCOUNTER — Encounter: Payer: Self-pay | Admitting: "Endocrinology

## 2021-10-08 VITALS — BP 124/76 | HR 76 | Ht 64.0 in | Wt 245.0 lb

## 2021-10-08 DIAGNOSIS — E559 Vitamin D deficiency, unspecified: Secondary | ICD-10-CM | POA: Diagnosis not present

## 2021-10-08 DIAGNOSIS — E1159 Type 2 diabetes mellitus with other circulatory complications: Secondary | ICD-10-CM | POA: Diagnosis not present

## 2021-10-08 DIAGNOSIS — E782 Mixed hyperlipidemia: Secondary | ICD-10-CM | POA: Diagnosis not present

## 2021-10-08 DIAGNOSIS — I10 Essential (primary) hypertension: Secondary | ICD-10-CM

## 2021-10-08 MED ORDER — MORPHINE SULFATE ER 30 MG PO TBCR: 30.0000 mg | EXTENDED_RELEASE_TABLET | Freq: Two times a day (BID) | ORAL | 0 refills | Status: DC

## 2021-10-08 NOTE — Telephone Encounter (Signed)
PMP was Reviewed.  MS Contin e-scribed today.

## 2021-10-08 NOTE — Addendum Note (Signed)
Addended by: Bayard Hugger on: 10/08/2021 08:20 AM   Modules accepted: Orders

## 2021-10-08 NOTE — Patient Instructions (Signed)
                                     Advice for Weight Management  -For most of us the best way to lose weight is by diet management. Generally speaking, diet management means consuming less calories intentionally which over time brings about progressive weight loss.  This can be achieved more effectively by avoiding ultra processed carbohydrates, processed meats, unhealthy fats.    It is critically important to know your numbers: how much calorie you are consuming and how much calorie you need. More importantly, our carbohydrates sources should be unprocessed naturally occurring  complex starch food items.  It is always important to balance nutrition also by  appropriate intake of proteins (mainly plant-based), healthy fats/oils, plenty of fruits and vegetables.   -The American College of Lifestyle Medicine (ACL M) recommends nutrition derived mostly from Whole Food, Plant Predominant Sources example an apple instead of applesauce or apple pie. Eat Plenty of vegetables, Mushrooms, fruits, Legumes, Whole Grains, Nuts, seeds in lieu of processed meats, processed snacks/pastries red meat, poultry, eggs.  Use only water or unsweetened tea for hydration.  The College also recommends the need to stay away from risky substances including alcohol, smoking; obtaining 7-9 hours of restorative sleep, at least 150 minutes of moderate intensity exercise weekly, importance of healthy social connections, and being mindful of stress and seek help when it is overwhelming.    -Sticking to a routine mealtime to eat 3 meals a day and avoiding unnecessary snacks is shown to have a big role in weight control. Under normal circumstances, the only time we burn stored energy is when we are hungry, so allow  some hunger to take place- hunger means no food between appropriate meal times, only water.  It is not advisable to starve.   -It is better to avoid simple carbohydrates including:  Cakes, Sweet Desserts, Ice Cream, Soda (diet and regular), Sweet Tea, Candies, Chips, Cookies, Store Bought Juices, Alcohol in Excess of  1-2 drinks a day, Lemonade,  Artificial Sweeteners, Doughnuts, Coffee Creamers, "Sugar-free" Products, etc, etc.  This is not a complete list.....    -Consulting with certified diabetes educators is proven to provide you with the most accurate and current information on diet.  Also, you may be  interested in discussing diet options/exchanges , we can schedule a visit with [name], RDN, CDE for individualized nutrition education.  -Exercise: If you are able: 30 -60 minutes a day ,4 days a week, or 150 minutes of moderate intensity exercise weekly.    The longer the better if tolerated.  Combine stretch, strength, and aerobic activities.  If you were told in the past that you have high risk for cardiovascular diseases, or if you are currently symptomatic, you may seek evaluation by your heart doctor prior to initiating moderate to intense exercise programs.                                  Additional Care Considerations for Diabetes/Prediabetes   -Diabetes  is a chronic disease.  The most important care consideration is regular follow-up with your diabetes care provider with the goal being avoiding or delaying its complications and to take advantage of advances in medications and technology.  If appropriate actions are taken early enough, type 2 diabetes can even be   reversed.  Seek information from the right source.  - Whole Food, Plant Predominant Nutrition is highly recommended: Eat Plenty of vegetables, Mushrooms, fruits, Legumes, Whole Grains, Nuts, seeds in lieu of processed meats, processed snacks/pastries red meat, poultry, eggs as recommended by American College of  Lifestyle Medicine (ACLM).  -Type 2 diabetes is known to coexist with other important comorbidities such as high blood pressure and high cholesterol.  It is critical to control not only the  diabetes but also the high blood pressure and high cholesterol to minimize and delay the risk of complications including coronary artery disease, stroke, amputations, blindness, etc.  The good news is that this diet recommendation for type 2 diabetes is also very helpful for managing high cholesterol and high blood blood pressure.  - Studies showed that people with diabetes will benefit from a class of medications known as ACE inhibitors and statins.  Unless there are specific reasons not to be on these medications, the standard of care is to consider getting one from these groups of medications at an optimal doses.  These medications are generally considered safe and proven to help protect the heart and the kidneys.    - People with diabetes are encouraged to initiate and maintain regular follow-up with eye doctors, foot doctors, dentists , and if necessary heart and kidney doctors.     - It is highly recommended that people with diabetes quit smoking or stay away from smoking, and get yearly  flu vaccine and pneumonia vaccine at least every 5 years.  See above for additional recommendations on exercise, sleep, stress management , and healthy social connections.      

## 2021-10-08 NOTE — Progress Notes (Signed)
10/08/2021  Endocrinology follow-up note   Subjective:    Patient ID: [name], female    DOB: 1950-05-01. Patient is returning with incomplete log for glycemic profile.  Her documented readings and her meter average history significant above Target.  Past Medical History:  Diagnosis Date  . Anxiety and depression   . Arthritis   . CAD (coronary artery disease)    Stent placement circumflex coronary 2007, catheterization 2008 patent stents. Normal LV function  . Chest pain   . CHF (congestive heart failure) ([place])   . COPD (chronic obstructive pulmonary disease) (HCC)    no home O2  . Depression   . Diabetes mellitus    Insulin dependent  . Diabetic polyneuropathy (H[facility])     severe on multiple medications  . Dyslipidemia   . GERD (gastroesophageal reflux disease)   . Headache(784.0)   . Heart attack ([place])   . Heartburn   . 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 High cholesterol   . Hx of blood clots   . Hypertension   . MI (myocardial infarction) ([place])    2007  . Obstructive sleep apnea    CPAP, setting ?2  . Palpitations   . Sleep apnea   . Tachycardia    Past Surgical History:  Procedure Laterality Date  . ABDOMINAL HYSTERECTOMY    . Arm Surgery Bilateral    due to fall-pt broke both forearms  . BIOPSY N/A 07/01/2013   Procedure: BIOPSY;  Surgeon: Rogene Houston, MD;  Location: [facility];  Service: Endoscopy;  Laterality: N/A;  . CHOLECYSTECTOMY    . COLONOSCOPY  08/06/2011   Procedure: COLONOSCOPY;  Surgeon: Rogene Houston, MD;  [facility];  Service: Endoscopy;  Laterality: N/A;  215  . COLONOSCOPY WITH PROPOFOL N/A 12/31/2012   Procedure: COLONOSCOPY WITH PROPOFOL;  Surgeon: Rogene Houston, MD;  Location: [facility];  Service: Endoscopy;  Laterality: N/A;  in cecum at 0814, total withdrawal time 8mn  . COLONOSCOPY WITH PROPOFOL N/A 02/05/2018   Procedure: COLONOSCOPY WITH PROPOFOL;  Surgeon: RRogene Houston MD;  [facility];  Service:  Endoscopy;  Laterality: N/A;  1:25  . CORONARY ANGIOPLASTY WITH STENT PLACEMENT    . CYSTOSCOPY W/ URETERAL STENT PLACEMENT Right [facility]10/2019   [facility] . CYSTOSCOPY WITH RETROGRADE PYELOGRAM, URETEROSCOPY AND STENT PLACEMENT Right 12/22/2019   Procedure: CYSTOSCOPY WITH RIGHT URETERAL STENT REMOVAL; RIGHT RETROGRADE PYELOGRAM,RIGHT URETEROSCOPY;  Surgeon: MCleon Gustin MD;  Location: [facility];  Service: Urology;  Laterality: Right;  . ESOPHAGEAL DILATION N/A 06/07/2015   Procedure: ESOPHAGEAL DILATION;  Surgeon: NRogene Houston MD;  [facility];  Service: Endoscopy;  Laterality: N/A;  . ESOPHAGOGASTRODUODENOSCOPY N/A 06/07/2015   Procedure: ESOPHAGOGASTRODUODENOSCOPY (EGD);  Surgeon: NRogene Houston MD;  [facility];  Service: Endoscopy;  Laterality: N/A;  2:45 - moved to 1:00 - Ann to notify pt  . ESOPHAGOGASTRODUODENOSCOPY (EGD) WITH PROPOFOL N/A 12/31/2012   Procedure: ESOPHAGOGASTRODUODENOSCOPY (EGD) WITH PROPOFOL;  Surgeon: NRogene Houston MD;  Location: [facility];  Service: Endoscopy;  Laterality: N/A;  GE junction 37,  . ESOPHAGOGASTRODUODENOSCOPY (EGD) WITH PROPOFOL N/A 07/01/2013   Procedure: ESOPHAGOGASTRODUODENOSCOPY (EGD) WITH PROPOFOL;  Surgeon: NRogene Houston MD;  Location: [facility];  Service: Endoscopy;  Laterality: N/A;  950  . MALONEY DILATION N/A 12/31/2012   Procedure: MALONEY DILATION;  Surgeon: NRogene Houston MD;  Location: [facility];  Service: Endoscopy;  Laterality: N/A;  used  a # 54,#56  . MALONEY DILATION N/A 07/01/2013   Procedure: Venia Minks DILATION;  Surgeon: Rogene Houston, MD;  Location: [facility];  Service: Endoscopy;  Laterality: N/A;  54/58; no heme present  . POLYPECTOMY  02/05/2018   Procedure: POLYPECTOMY;  Surgeon: Rogene Houston, MD;  [facility];  Service: Endoscopy;;  distal rectum (HS)  . STONE EXTRACTION WITH BASKET Right 12/22/2019   Procedure: STONE EXTRACTION WITH BASKET;  Surgeon: Cleon Gustin, MD;  Location: [facility];  Service: Urology;  Laterality: Right;   Social History   Socioeconomic History  . Marital status: Divorced    Spouse name: Not on file  . Number of children: 4  . Years of education: Not on file  . Highest education level: Not on file  Occupational History  . Occupation: Disabled  Tobacco Use  . Smoking status: Never  . Smokeless tobacco: Never  Vaping Use  . Vaping Use: Never used  Substance and Sexual Activity  . Alcohol use: No    Alcohol/week: 0.0 standard drinks of alcohol  . Drug use: No  . Sexual activity: Not on file  Other Topics Concern  . Not on file  Social History Narrative   Patient does not get regular exercise   Denies caffeine use    Social Determinants of Health   Financial Resource Strain: Not on file  Food Insecurity: Not on file  Transportation Needs: Not on file  Physical Activity: Not on file  Stress: Not on file  Social Connections: Not on file   Outpatient Encounter Medications as of 10/08/2021  Medication Sig  . ALPRAZolam (XANAX) 0.5 MG tablet Take 0.5 mg by mouth 2 (two) times daily as needed for anxiety or sleep.  . Armodafinil 150 MG tablet Take 150 mg by mouth daily.  Marland Kitchen aspirin EC 81 MG tablet Take 1 tablet (81 mg total) by mouth daily.  Marland Kitchen atorvastatin (LIPITOR) 40 MG tablet Take 40 mg by mouth daily.  . B-D ULTRAFINE III SHORT PEN 31G X 8 MM MISC Inject into the skin 4 (four) times daily.  . Blood Glucose Monitoring Suppl (ACCU-CHEK GUIDE ME) w/Device KIT 1 Piece by Does not apply route as directed.  Marland Kitchen buPROPion (WELLBUTRIN SR) 100 MG 12 hr tablet Take 100 mg by mouth daily.  . Cholecalciferol (VITAMIN D3) 125 MCG (5000 UT) CAPS TAKE ONE CAPSULE BY MOUTH DAILY (Patient taking differently: Take 5,000 Units by mouth daily.)  . diclofenac Sodium (VOLTAREN) 1 % GEL APPLY TO BOTH KNEES THREE TIMES DAILY (Patient taking differently: Apply 2 g topically in the morning, at noon, and at bedtime.)  . diltiazem (CARDIZEM CD) 240 MG 24 hr  capsule Take 240 mg by mouth daily.  Marland Kitchen doxepin (SINEQUAN) 25 MG capsule Take 75 mg by mouth at bedtime.   Marland Kitchen estradiol (ESTRACE) 0.5 MG tablet Take 0.5 mg by mouth daily.   . FEROSUL 325 (65 Fe) MG tablet Take 325 mg by mouth every morning.  . furosemide (LASIX) 40 MG tablet Take 1.5 tablets (60 mg total) by mouth daily.  Marland Kitchen gabapentin (NEURONTIN) 400 MG capsule Take 400 mg by mouth 3 (three) times daily.  Marland Kitchen glucose blood (ACCU-CHEK GUIDE) test strip Use as instructed  . Insulin Lispro Prot & Lispro (HUMALOG MIX 75/25 KWIKPEN) (75-25) 100 UNIT/ML Kwikpen Inject 70 Units into the skin 2 (two) times daily before a meal.  . isosorbide mononitrate (IMDUR) 30 MG 24 hr tablet Take 0.5 tablets (15 mg total)  by mouth daily.  Marland Kitchen lisinopril (ZESTRIL) 5 MG tablet TAKE 1 TABLET BY MOUTH DAILY  . meloxicam (MOBIC) 7.5 MG tablet Take 7.5 mg by mouth in the morning and at bedtime.   . metFORMIN (GLUCOPHAGE) 500 MG tablet TAKE 1 TABLET BY MOUTH TWICE DAILY (Patient taking differently: Take 1,000 mg by mouth 2 (two) times daily with a meal.)  . methocarbamol (ROBAXIN) 500 MG tablet Take 500 mg by mouth 2 (two) times daily.  . metoCLOPramide (REGLAN) 5 MG tablet Take 5 mg by mouth 3 (three) times daily as needed for nausea or vomiting.   . metoprolol succinate (TOPROL-XL) 100 MG 24 hr tablet Take 100 mg by mouth daily. Take with or immediately following a meal.  . montelukast (SINGULAIR) 10 MG tablet Take 10 mg by mouth at bedtime.   Marland Kitchen morphine (MS CONTIN) 30 MG 12 hr tablet Take 1 tablet (30 mg total) by mouth every 12 (twelve) hours.  . nitroGLYCERIN (NITROSTAT) 0.4 MG SL tablet Place 1 tablet (0.4 mg total) under the tongue every 5 (five) minutes as needed for chest pain (up to 3 doses. If taking 3rd dose call 911).  . ondansetron (ZOFRAN) 4 MG tablet Take 4 mg by mouth every other day.  . pantoprazole (PROTONIX) 40 MG tablet TAKE 1 TABLET BY MOUTH TWICE DAILY BEFORE MEALS (Patient taking differently: Take 40 mg by  mouth 2 (two) times daily before a meal.)  . polyethylene glycol (MIRALAX / GLYCOLAX) 17 g packet Take 17 g by mouth daily as needed for moderate constipation.  . potassium chloride (KLOR-CON) 10 MEQ tablet Take 10 mEq by mouth 2 (two) times daily.  Marland Kitchen PROAIR HFA 108 (90 Base) MCG/ACT inhaler Inhale 2 puffs into the lungs every 4 (four) hours as needed for wheezing or shortness of breath.   . promethazine (PHENERGAN) 25 MG tablet Take 1 tablet (25 mg total) by mouth 4 (four) times daily as needed. (Patient taking differently: Take 25 mg by mouth 4 (four) times daily as needed for nausea or vomiting.)  . SURE COMFORT INS SYR 1CC/30G 30G X 5/16" 1 ML MISC 1 each by Other route daily.  Marland Kitchen topiramate (TOPAMAX) 200 MG tablet Take 200 mg by mouth at bedtime.   No facility-administered encounter medications on file as of 10/08/2021.   ALLERGIES: Allergies  Allergen Reactions  . Amphetamine-Dextroamphetamine Swelling  . Nitrofuran Derivatives Itching and Swelling  . Amphetamine-Dextroamphet Er Swelling  . Pregabalin Swelling  . Topiramate Other (See Comments)    Tongue tingle  . Verelan [Verapamil] Rash   VACCINATION STATUS:  There is no immunization history on file for this patient.  Diabetes She presents for her follow-up diabetic visit. She has type 2 diabetes mellitus. Onset time: She was diagnosed at approximate age of 56 years. Her disease course has been worsening. There are no hypoglycemic associated symptoms. Pertinent negatives for hypoglycemia include no confusion, headaches, pallor or seizures. Pertinent negatives for diabetes include no chest pain, no fatigue, no polydipsia, no polyphagia and no polyuria. There are no hypoglycemic complications. Symptoms are worsening. Diabetic complications include autonomic neuropathy, heart disease, peripheral neuropathy, PVD and retinopathy. Risk factors for coronary artery disease include dyslipidemia, diabetes mellitus, obesity, hypertension and  sedentary lifestyle. Current diabetic treatments: Lantus 65 units daily at bedtime, Humalog 15 units 3 times a day before meals. She mentions allergy to Victoza, Byetta, and metformin. Her weight is fluctuating minimally. She is following a generally unhealthy diet. When asked about meal planning, she reported  none. She has not had a previous visit with a dietitian. She never participates in exercise. Her home blood glucose trend is increasing steadily. Her breakfast blood glucose range is generally >200 mg/dl. Her dinner blood glucose range is generally >200 mg/dl. Her bedtime blood glucose range is generally >200 mg/dl. Her overall blood glucose range is >200 mg/dl. (She was recently switched to Humalog 75/25 due to the fact that she could not connect multiple daily injections of insulin to treat her diabetes.  She presents with persistent hyperglycemia.  Point-of-care A1c is 9.9%.  She is not documenting her activities completely.  No hypoglycemia documented.     ) An ACE inhibitor/angiotensin II receptor blocker is not being taken. Eye exam is current.  Hyperlipidemia This is a chronic problem. The current episode started more than 1 year ago. The problem is uncontrolled. Recent lipid tests were reviewed and are high. Exacerbating diseases include diabetes and obesity. Pertinent negatives include no chest pain, myalgias or shortness of breath. Current antihyperlipidemic treatment includes statins. Risk factors for coronary artery disease include diabetes mellitus, dyslipidemia, hypertension, obesity and a sedentary lifestyle.  Hypertension This is a chronic problem. The current episode started more than 1 year ago. Pertinent negatives include no chest pain, headaches, palpitations or shortness of breath. Risk factors for coronary artery disease include diabetes mellitus, dyslipidemia, obesity and sedentary lifestyle. Hypertensive end-organ damage includes CAD/MI, PVD and retinopathy.    Review of  systems: Limited as above.  Objective:    BP 124/76   Pulse 76   Ht 5' 4"  (1.626 m)   Wt 245 lb (111.1 kg)   BMI 42.05 kg/m   Wt Readings from Last 3 Encounters:  10/08/21 245 lb (111.1 kg)  09/03/21 246 lb (111.6 kg)  08/09/21 248 lb 6.4 oz (112.7 kg)     CMP     Component Value Date/Time   NA 140 03/21/2021 1139   K 5.4 (H) 03/21/2021 1139   CL 101 03/21/2021 1139   CO2 24 03/21/2021 1139   GLUCOSE 229 (H) 03/21/2021 1139   GLUCOSE 233 (H) 08/08/2020 0244   BUN 10 03/21/2021 1139   CREATININE 0.68 03/21/2021 1139   CREATININE 0.70 11/02/2018 1105   CALCIUM 9.1 03/21/2021 1139   PROT 6.1 03/21/2021 1139   ALBUMIN 3.3 (L) 03/21/2021 1139   AST 19 03/21/2021 1139   ALT 13 03/21/2021 1139   ALKPHOS 99 03/21/2021 1139   BILITOT 0.4 03/21/2021 1139   GFRNONAA >60 08/08/2020 0244   GFRNONAA 91 11/02/2018 1105   GFRAA >60 10/27/2019 1130   GFRAA 105 11/02/2018 1105   Diabetic Labs (most recent): Lab Results  Component Value Date   HGBA1C 9.9 (A) 09/03/2021   HGBA1C 9.1 (A) 04/18/2021   HGBA1C 10.1 (A) 11/27/2020   MICROALBUR <0.2 02/09/2017    Lipid Panel     Component Value Date/Time   CHOL 115 08/08/2020 0244   CHOL 127 07/05/2020 1338   TRIG 118 08/08/2020 0244   HDL 37 (L) 08/08/2020 0244   HDL 46 07/05/2020 1338   CHOLHDL 3.1 08/08/2020 0244   VLDL 24 08/08/2020 0244   LDLCALC 54 08/08/2020 0244   LDLCALC 43 07/05/2020 1338   LDLCALC 90 07/06/2018 0936     Assessment & Plan:   1. Type 2 diabetes mellitus with vascular disease ([place]) - Patient has currently uncontrolled symptomatic type 2 DM since  71 years of age.  She was recently switched to Humalog 75/25 due to the fact that  she could not connect multiple daily injections of insulin to treat her diabetes.  She presents with persistent hyperglycemia.  Point-of-care A1c is 9.9%.  She is not documenting her activities completely.  No hypoglycemia documented.      Recent labs reviewed.  - Her  diabetes is complicated by obesity/sedentary life, coronary artery disease status post stent placement and patient remains at a high risk for more acute and chronic complications of diabetes which include CAD, CVA, CKD, retinopathy, and neuropathy. These are all discussed in detail with the patient.  - I have counseled the patient on diet management and weight loss, by adopting a carbohydrate restricted/protein rich diet.  - she acknowledges that there is a room for improvement in her food and drink choices. - Suggestion is made for her to avoid simple carbohydrates  from her diet including Cakes, Sweet Desserts, Ice Cream, Soda (diet and regular), Sweet Tea, Candies, Chips, Cookies, Store Bought Juices, Alcohol , Artificial Sweeteners,  Coffee Creamer, and "Sugar-free" Products, Lemonade. This will help patient to have more stable blood glucose profile and potentially avoid unintended weight gain.  The following Lifestyle Medicine recommendations according to [place]  [facility]) were discussed and and offered to patient and she  agrees to start the journey:  A. Whole Foods, Plant-Based Nutrition comprising of fruits and vegetables, plant-based proteins, whole-grain carbohydrates was discussed in detail with the patient.   A list for source of those nutrients were also provided to the patient.  Patient will use only water or unsweetened tea for hydration. B.  The need to stay away from risky substances including alcohol, smoking; obtaining 7 to 9 hours of restorative sleep, at least 150 minutes of moderate intensity exercise weekly, the importance of healthy social connections,  and stress management techniques were discussed. C.  A full color page of  Calorie density of various food groups per pound showing examples of each food groups was provided to the patient.  - Patient is advised to stick to a routine mealtimes to eat 3 meals  a day and avoid unnecessary snacks ( to  snack only to correct hypoglycemia).   - I have approached patient with the following individualized plan to manage diabetes and patient agrees:   -In light of her presentation with significantly above target glycemic profile, she will need a higher dose of insulin in order for her to achieve control of diabetes to target.    Accordingly, I discussed and increase her Humalog 75/25 to 70 units with breakfast, 70 units with supper  only if glucose readings are above 90 and only if she is eating.    She is advised and encouraged to continue monitoring blood glucose 4 times a day-daily before 3 meals and at bedtime.   -She is warned not to take insulin without proper monitoring of blood glucose.  -Her insurance did not provide coverage for CGM device.  -She continues to have normal renal function, she will continue to benefit from low-dose metformin.  I discussed and continued metformin 500 mg p.o. twice daily.    -Patient is encouraged to call clinic for blood glucose levels less than 70 or above 200 mg /dl.  -She has intolerance to GLP-1 receptor agonists.  2) BP/HTN: -Her blood pressure is controlled to target.  She has adequate medications, advised to continue including metoprolol XL 50 mg p.o. daily.  3) Lipids/HPL: She has uncontrolled hypertriglyceridemia , still high at 439  LDL better at 50, HDL  at 30. She is advised to continue Lipitor 40 mg daily nightly.  She is educated on the fact that   better control of diabetes will help for triglycerides.  She is also educated on the link between her diet and her cholesterol levels.  She would benefit from WF PB diet in terms of her high cholesterol as well.   4) Chronic Care/Health Maintenance:  -Patient is on Statin medications and encouraged to continue to follow up with Ophthalmology, Podiatrist at least yearly or according to recommendations, and advised to   stay away from smoking. I have recommended yearly flu vaccine and pneumonia  vaccination at least every 5 years;  and  sleep for at least 7 hours a day. -She will benefit from a set of diabetic shoes, paperwork filled for her.   - I advised patient to maintain close follow up with Glenda Chroman, MD for primary care needs.  I spent 41 minutes in the care of the patient today including review of labs from [facility], Lipids, Thyroid Function, Hematology (current and previous including abstractions from other facilities); face-to-face time discussing  her blood glucose readings/logs, discussing hypoglycemia and hyperglycemia episodes and symptoms, medications doses, her options of short and long term treatment based on the latest standards of care / guidelines;  discussion about incorporating lifestyle medicine;  and documenting the encounter.    Please refer to Patient Instructions for Blood Glucose Monitoring and Insulin/Medications Dosing Guide"  in media tab for additional information. Please  also refer to " Patient Self Inventory" in the Media  tab for reviewed elements of pertinent patient history.  [name] participated in the discussions, expressed understanding, and voiced agreement with the above plans.  All questions were answered to her satisfaction. she is encouraged to contact clinic should she have any questions or concerns prior to her return visit.   Follow up plan: - Patient to go to emergency room for better evaluation of chest pain. Return in about 3 months (around 01/08/2022) for F/U with Pre-visit Labs, Meter/CGM/Logs, A1c here.  Glade Lloyd, MD Phone: [phone]  Fax: [phone]  This note was partially dictated with voice recognition software. Similar sounding words can be transcribed inadequately or may not  be corrected upon review.  10/08/2021, 8:54 PM

## 2021-10-11 ENCOUNTER — Encounter: Payer: Medicare Other | Admitting: Registered Nurse

## 2021-10-15 DIAGNOSIS — G4733 Obstructive sleep apnea (adult) (pediatric): Secondary | ICD-10-CM | POA: Diagnosis not present

## 2021-10-15 DIAGNOSIS — I509 Heart failure, unspecified: Secondary | ICD-10-CM | POA: Diagnosis not present

## 2021-10-15 DIAGNOSIS — R0902 Hypoxemia: Secondary | ICD-10-CM | POA: Diagnosis not present

## 2021-10-17 ENCOUNTER — Encounter: Payer: Self-pay | Admitting: Registered Nurse

## 2021-10-17 ENCOUNTER — Encounter: Payer: Medicare Other | Attending: Registered Nurse | Admitting: Registered Nurse

## 2021-10-17 VITALS — BP 109/69 | HR 77 | Ht 64.0 in | Wt 250.0 lb

## 2021-10-17 DIAGNOSIS — M5412 Radiculopathy, cervical region: Secondary | ICD-10-CM

## 2021-10-17 DIAGNOSIS — M7061 Trochanteric bursitis, right hip: Secondary | ICD-10-CM

## 2021-10-17 DIAGNOSIS — Z79891 Long term (current) use of opiate analgesic: Secondary | ICD-10-CM | POA: Diagnosis not present

## 2021-10-17 DIAGNOSIS — M48062 Spinal stenosis, lumbar region with neurogenic claudication: Secondary | ICD-10-CM

## 2021-10-17 DIAGNOSIS — M1712 Unilateral primary osteoarthritis, left knee: Secondary | ICD-10-CM | POA: Diagnosis not present

## 2021-10-17 DIAGNOSIS — Z5181 Encounter for therapeutic drug level monitoring: Secondary | ICD-10-CM

## 2021-10-17 DIAGNOSIS — G894 Chronic pain syndrome: Secondary | ICD-10-CM

## 2021-10-17 DIAGNOSIS — M7062 Trochanteric bursitis, left hip: Secondary | ICD-10-CM | POA: Insufficient documentation

## 2021-10-17 DIAGNOSIS — M5416 Radiculopathy, lumbar region: Secondary | ICD-10-CM

## 2021-10-17 DIAGNOSIS — M1711 Unilateral primary osteoarthritis, right knee: Secondary | ICD-10-CM | POA: Diagnosis not present

## 2021-10-17 DIAGNOSIS — M542 Cervicalgia: Secondary | ICD-10-CM

## 2021-10-17 DIAGNOSIS — M546 Pain in thoracic spine: Secondary | ICD-10-CM

## 2021-10-17 MED ORDER — MORPHINE SULFATE ER 30 MG PO TBCR: 30.0000 mg | EXTENDED_RELEASE_TABLET | Freq: Two times a day (BID) | ORAL | 0 refills | Status: DC

## 2021-10-17 NOTE — Progress Notes (Signed)
Subjective:    Patient ID: [name], female    DOB: 1951/02/05, 71 y.o.   MRN: [id number]  HPI: [name] is a 71 y.o. female who returns for follow up appointment for chronic pain and medication refill. She states her pain is located in her neck radiating into her bilateral shoulders, mid- lower back pain radiating into her bilateral hips and bilateral lower extremities. She rates her pain 8. Her current exercise regime is walking and performing stretching exercises.  [name] Morphine equivalent is 60.00 MME.She  is also prescribed Alprazolam  by Dr. Woody Seller .We have discussed the black box warning of using opioids and benzodiazepines. I highlighted the dangers of using these drugs together and discussed the adverse events including respiratory suppression, overdose, cognitive impairment and importance of compliance with current regimen. We will continue to monitor and adjust as indicated.     Last Oral Swab was Performed on 05/16/2021, it was consistent.     [name] has an appointment with a Pain Management office in [place], she will let this provider know if they accept her. She states she needs to find office closer to her home due to transportation issues.    Pain Inventory Average Pain 7 Pain Right Now 8 My pain is sharp, burning, dull, tingling, and aching  In the last 24 hours, has pain interfered with the following? General activity 6 Relation with others 6 Enjoyment of life 6 What TIME of day is your pain at its worst? night Sleep (in general) Fair  Pain is worse with: walking, bending, sitting, and standing Pain improves with: rest and heat/ice Relief from Meds: 0  Family History  Problem Relation Age of Onset   Heart attack Mother    Colon cancer Mother    Heart attack Father    Stroke Sister    Hemophilia Child    Cancer Other    Coronary artery disease Other    Cancer Brother    Cancer Maternal Aunt    Cancer Maternal Uncle    Cancer Paternal Aunt     Cancer Paternal Uncle    Cancer Brother    Social History   Socioeconomic History   Marital status: Divorced    Spouse name: Not on file   Number of children: 4   Years of education: Not on file   Highest education level: Not on file  Occupational History   Occupation: Disabled  Tobacco Use   Smoking status: Never   Smokeless tobacco: Never  Vaping Use   Vaping Use: Never used  Substance and Sexual Activity   Alcohol use: No    Alcohol/week: 0.0 standard drinks of alcohol   Drug use: No   Sexual activity: Not on file  Other Topics Concern   Not on file  Social History Narrative   Patient does not get regular exercise   Denies caffeine use    Social Determinants of Health   Financial Resource Strain: Not on file  Food Insecurity: Not on file  Transportation Needs: Not on file  Physical Activity: Not on file  Stress: Not on file  Social Connections: Not on file   Past Surgical History:  Procedure Laterality Date   ABDOMINAL HYSTERECTOMY     Arm Surgery Bilateral    due to fall-pt broke both forearms   BIOPSY N/A 07/01/2013   Procedure: BIOPSY;  Surgeon: Rogene Houston, MD;  Location: [facility];  Service: Endoscopy;  Laterality: N/A;   CHOLECYSTECTOMY  COLONOSCOPY  08/06/2011   Procedure: COLONOSCOPY;  Surgeon: Rogene Houston, MD;  [facility];  Service: Endoscopy;  Laterality: N/A;  215   COLONOSCOPY WITH PROPOFOL N/A 12/31/2012   Procedure: COLONOSCOPY WITH PROPOFOL;  Surgeon: Rogene Houston, MD;  Location: [facility];  Service: Endoscopy;  Laterality: N/A;  in cecum at 0814, total withdrawal time 11mn   COLONOSCOPY WITH PROPOFOL N/A 02/05/2018   Procedure: COLONOSCOPY WITH PROPOFOL;  Surgeon: RRogene Houston MD;  [facility];  Service: Endoscopy;  Laterality: N/A;  1:25   CORONARY ANGIOPLASTY WITH STENT PLACEMENT     CYSTOSCOPY W/ URETERAL STENT PLACEMENT Right 11/10/2019   [place] [facility] [place]  AND STENT PLACEMENT Right 12/22/2019   Procedure: CYSTOSCOPY WITH RIGHT URETERAL STENT REMOVAL; RIGHT RETROGRADE PYELOGRAM,RIGHT URETEROSCOPY;  Surgeon: MCleon Gustin MD;  Location: [facility];  Service: Urology;  Laterality: Right;   ESOPHAGEAL DILATION N/A 06/07/2015   Procedure: ESOPHAGEAL DILATION;  Surgeon: NRogene Houston MD;  [facility];  Service: Endoscopy;  Laterality: N/A;   ESOPHAGOGASTRODUODENOSCOPY N/A 06/07/2015   Procedure: ESOPHAGOGASTRODUODENOSCOPY (EGD);  Surgeon: NRogene Houston MD;  [facility];  Service: Endoscopy;  Laterality: N/A;  2:45 - moved to 1:00 - Ann to notify pt   ESOPHAGOGASTRODUODENOSCOPY (EGD) WITH PROPOFOL N/A 12/31/2012   Procedure: ESOPHAGOGASTRODUODENOSCOPY (EGD) WITH PROPOFOL;  Surgeon: NRogene Houston MD;  [facility]: [facility];  Service: Endoscopy;  Laterality: N/A;  GE junction 37,   ESOPHAGOGASTRODUODENOSCOPY (EGD) WITH PROPOFOL N/A 07/01/2013   Procedure: ESOPHAGOGASTRODUODENOSCOPY (EGD) WITH PROPOFOL;  Surgeon: NRogene Houston MD;  [facility];  Service: Endoscopy;  Laterality: N/A;  9[place]N/A 12/31/2012   Procedure: MALONEY DILATION;  Surgeon: NRogene Houston MD;  [facility]: [facility];  Service: Endoscopy;  Laterality: N/A;  used a # 54,#56   MALONEY DILATION N/A 07/01/2013   Procedure: MALONEY DILATION;  Surgeon: NRogene Houston MD;  [facility]: [facility];  Service: Endoscopy;  Laterality: N/A;  54/58; no heme present   POLYPECTOMY  02/05/2018   Procedure: POLYPECTOMY;  Surgeon: RRogene Houston MD;  [facility];  Service: Endoscopy;;  distal rectum (HS)   STONE EXTRACTION WITH BASKET Right 12/22/2019   Procedure: STONE EXTRACTION WITH BASKET;  Surgeon: MCleon Gustin MD;  [facility]: [facility];  Service: Urology;  Laterality: Right;   Past Surgical History:  Procedure Laterality Date   ABDOMINAL HYSTERECTOMY     Arm Surgery Bilateral    due to fall-pt broke both forearms   BIOPSY N/A 07/01/2013    Procedure: BIOPSY;  Surgeon: NRogene Houston MD;  [facility];  Service: Endoscopy;  Laterality: N/A;   CHOLECYSTECTOMY     COLONOSCOPY  08/06/2011   Procedure: COLONOSCOPY;  Surgeon: NRogene Houston MD;  [facility];  Service: Endoscopy;  Laterality: N/A;  215   COLONOSCOPY WITH PROPOFOL N/A 12/31/2012   Procedure: COLONOSCOPY WITH PROPOFOL;  Surgeon: NRogene Houston MD;  [facility]: [facility];  Service: Endoscopy;  Laterality: N/A;  in cecum at 0814, total withdrawal time 123m   COLONOSCOPY WITH PROPOFOL N/A 02/05/2018   Procedure: COLONOSCOPY WITH PROPOFOL;  Surgeon: ReRogene HoustonMD;  [facility];  Service: Endoscopy;  Laterality: N/A;  1:25   CORONARY ANGIOPLASTY WITH STENT PLACEMENT     CYSTOSCOPY W/ URETERAL STENT PLACEMENT [place]   [place] [facility]  RETROGRADE PYELOGRAM, URETEROSCOPY AND STENT PLACEMENT Right 12/22/2019   Procedure: CYSTOSCOPY WITH RIGHT URETERAL STENT REMOVAL; RIGHT RETROGRADE PYELOGRAM,RIGHT URETEROSCOPY;  Surgeon: Cleon Gustin, MD;  [facility];  Service: Urology;  Laterality: Right;   ESOPHAGEAL DILATION N/A 06/07/2015   Procedure: ESOPHAGEAL DILATION;  Surgeon: Rogene Houston, MD;  [facility];  Service: Endoscopy;  Laterality: N/A;   ESOPHAGOGASTRODUODENOSCOPY N/A 06/07/2015   Procedure: ESOPHAGOGASTRODUODENOSCOPY (EGD);  Surgeon: Rogene Houston, MD;  [facility];  Service: Endoscopy;  Laterality: N/A;  2:45 - moved to 1:00 - Ann to notify pt   ESOPHAGOGASTRODUODENOSCOPY (EGD) WITH PROPOFOL N/A 12/31/2012   Procedure: ESOPHAGOGASTRODUODENOSCOPY (EGD) WITH PROPOFOL;  Surgeon: Rogene Houston, MD;  [facility];  Service: Endoscopy;  Laterality: N/A;  GE junction 37,   ESOPHAGOGASTRODUODENOSCOPY (EGD) WITH PROPOFOL N/A 07/01/2013   Procedure: ESOPHAGOGASTRODUODENOSCOPY (EGD) WITH PROPOFOL;  Surgeon: Rogene Houston, MD;  [facility]: [facility];  Service: Endoscopy;  Laterality: N/A;   [place] N/A 12/31/2012   Procedure: MALONEY DILATION;  Surgeon: Rogene Houston, MD;  [facility];  Service: Endoscopy;  Laterality: N/A;  used a # 54,#56   MALONEY DILATION N/A 07/01/2013   Procedure: MALONEY DILATION;  Surgeon: Rogene Houston, MD;  [facility];  Service: Endoscopy;  Laterality: N/A;  54/58; no heme present   POLYPECTOMY  02/05/2018   Procedure: POLYPECTOMY;  Surgeon: Rogene Houston, MD;  [facility];  Service: Endoscopy;;  distal rectum (HS)   STONE EXTRACTION WITH BASKET Right 12/22/2019   Procedure: STONE EXTRACTION WITH BASKET;  Surgeon: Cleon Gustin, MD;  Location: [facility];  Service: Urology;  Laterality: Right;   Past Medical History:  Diagnosis Date   Anxiety and depression    Arthritis    CAD (coronary artery disease)    Stent placement circumflex coronary 2007, catheterization 2008 patent stents. 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    High cholesterol    Hx of blood clots    Hypertension    MI (myocardial infarction) ([place])    2007   Obstructive sleep apnea    CPAP, setting ?2   Palpitations    Sleep apnea    Tachycardia    BP 109/69   Pulse 77   Ht 5' 4"  (1.626 m)   Wt 250 lb (113.4 kg)   SpO2 98%   BMI 42.91 kg/m   Opioid Risk Score:   Fall Risk Score:  `1  Depression screen PHQ 2/9     10/17/2021    1:56 PM 08/09/2021    1:00 PM 05/16/2021    1:15 PM 10/11/2020    1:04 PM 01/24/2020    1:19 PM 11/18/2019   10:36 AM 10/26/2019    1:03 PM  Depression screen PHQ 2/9  Decreased Interest 3 1 0 1 0 1 0  Down, Depressed, Hopeless 3 1 3   0 1 0   PHQ - 2 Score 6 2 3 1  0 2 0  Altered sleeping 0        Tired, decreased energy 1        Change in appetite 0        Feeling bad or failure about yourself  0        Trouble concentrating 1        Moving slowly or fidgety/restless 0        Suicidal thoughts 0        PHQ-9 Score 8        Difficult doing work/chores Not difficult at all            Review of Systems  Musculoskeletal:  Positive for back pain.       Bilateral thigh and leg pain   All other systems reviewed and are negative.      Objective:   Physical Exam Vitals and nursing note reviewed.  Constitutional:      Appearance: Normal appearance.  Cardiovascular:     Rate and Rhythm: Normal rate and regular rhythm.     Pulses: Normal pulses.     Heart sounds: Normal heart sounds.  Pulmonary:     Effort: Pulmonary effort is normal.     Breath sounds: Normal breath sounds.  Musculoskeletal:     Cervical back: Normal range of motion and neck supple.     Comments: Normal Muscle Bulk and Muscle Testing Reveals:  Upper Extremities: Full ROM and Muscle Strength 5/5 Bilateral AC Joint Tenderness Thoracic and Lumbar Hypersensitivity Bilateral Greater Trochanter Tenderness Lower Extremities: Full ROM and Muscle Strength 5/5 Arises from Table Slowly using walker for support Antalgic  Gait     Skin:    General: Skin is warm and dry.  Neurological:     Mental Status: She is alert and oriented to person, place, and time.  Psychiatric:        Mood and Affect: Mood normal.        Behavior: Behavior normal.         Assessment & Plan:  1.Lumbar pain lumbar spondylosis:  Encouraged to continue to increase Activity as tolerated. 10/17/2021 Continue current medication regimen.  Refilled: MS Contin 30 MG one tablet every 12 hours #60. Boyfriend Joe is dispensing Ms. Skaggs Medication she reports. We will continue the opioid monitoring program, this consists of regular clinic visits, examinations, urine drug screen, pill  counts as well as use of [place] Controlled Substance Reporting system. A 12 month History has been reviewed on the [facility] on 10/17/2021.  2. Lumbar Radiculitis: Continue current medication regimen with Gabapentin. 10/17/2021 3. Bilateral Knee Pain/ Degenerative: Continue Voltaren Gel. Continue to Monitor. 10/17/2021 4. Severe diabetic poly neuropathy: Continue current medication regimen with Gabapentin. 10/17/2021 5. Insomnia: Continue Armodafinil Psychiatry Following. 10/17/2021 6.Anxiety/Depression: Continue Current Medication regimen as prescribed by PCP and   Psychiatry. 10/17/2021 7. De Quervains Tenosynovitis: No Complaints Today. 10/17/2021 8. Bilateral  Greater Trochanteric Bursitis: Continue with Ice and Heat Therapy. Continue to Monitor. 10/17/2021.  9. Cervicalgia/ Cervical Radiculitis:Continue Gabapentin. Continue with HEP as Tolerated and continue to monitor. 10/17/2021 10. Chronic Bilateral Thoracic Back Pain: Continue HEP as Tolerated. Continue current medication regimen. Continue to monitor. 10/17/2021.     F/U in 1 month

## 2021-10-28 DIAGNOSIS — M179 Osteoarthritis of knee, unspecified: Secondary | ICD-10-CM | POA: Diagnosis not present

## 2021-10-28 DIAGNOSIS — R296 Repeated falls: Secondary | ICD-10-CM | POA: Diagnosis not present

## 2021-11-14 ENCOUNTER — Ambulatory Visit: Payer: 59 | Admitting: Cardiology

## 2021-11-14 NOTE — Progress Notes (Deleted)
Clinical Summary Ms. Laird is a 71 y.o.female  seen today for follow up of the following medical problems.   1. CAD -history of MI in 2007, had PCI to LCX  08/2020 nuclear stress no ischemia  07/2020 echo: LVEF 55-60%, no WMAs, grade I dd, normal RV   -chronic chest pains - pain midchest, pressure like pain. Can occur at rest or with activity. Not positional. Lasts 20-30 minutes. Can be better with prevacid but also NG. 2-3 epsidoes per week.   -last visit started imdur 32m daily.    2. Chronic diastolic HF - increased swelling - compliant with lasix 462mdaily   3. OSA  - was to establish with pulmionary   4. Afib - 10/[facility], had some afib during that admission. No prior history.  - was started on eliquis at the time but Rx ran out, no longer taking.   04/2020 30 day monitor: no arrhythmias Has not been committed to long term anticoag     5. HTN - compliant with meds Past Medical History:  Diagnosis Date   Anxiety and depression    Arthritis    CAD (coronary artery disease)    Stent placement circumflex coronary 2007, catheterization 2008 patent stents. 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    High cholesterol    Hx of blood clots    Hypertension    [place]   [place]   Obstructive sleep apnea    CPAP, setting ?2   Palpitations    Sleep apnea    Tachycardia      Allergies  Allergen Reactions   Amphetamine-Dextroamphetamine Swelling   Nitrofuran Derivatives Itching and Swelling   Amphetamine-Dextroamphet Er  Swelling   Pregabalin Swelling   Topiramate Other (See Comments)    Tongue tingle   Verelan [Verapamil] Rash     Current Outpatient Medications  Medication Sig Dispense Refill   ALPRAZolam (XANAX) 0.5 MG tablet Take 0.5 mg by mouth 2 (two) times daily as needed for anxiety or sleep.     Armodafinil 150 MG tablet Take 150 mg by mouth daily.     aspirin EC 81 MG tablet Take 1 tablet (81 mg total) by mouth daily.     atorvastatin (LIPITOR) 40 MG tablet Take 40 mg by mouth daily.     B-D ULTRAFINE III SHORT PEN 31G X 8 MM MISC Inject into the skin 4 (four) times daily.     Blood Glucose Monitoring Suppl (ACCU-CHEK GUIDE ME) w/Device KIT 1 Piece by Does not apply route as directed. 1 kit 0   buPROPion (WELLBUTRIN SR) 100 MG 12 hr tablet Take 100 mg by mouth daily.     Cholecalciferol (VITAMIN D3) 125 MCG (5000 UT) CAPS TAKE ONE CAPSULE BY MOUTH DAILY (Patient taking differently: Take 5,000 Units by mouth daily.) 90 capsule 0   diclofenac Sodium (VOLTAREN) 1 % GEL Apply topically. (Patient not taking: Reported on 10/17/2021)  diltiazem (CARDIZEM CD) 240 MG 24 hr capsule Take 240 mg by mouth daily.     doxepin (SINEQUAN) 25 MG capsule Take 75 mg by mouth at bedtime.      estradiol (ESTRACE) 0.5 MG tablet Take 0.5 mg by mouth daily.      ferrous sulfate 325 (65 FE) MG EC tablet Take by mouth.     furosemide (LASIX) 40 MG tablet Take 1.5 tablets (60 mg total) by mouth daily. 135 tablet 3   gabapentin (NEURONTIN) 400 MG capsule Take 400 mg by mouth 3 (three) times daily.  5   glipiZIDE (GLUCOTROL XL) 5 MG 24 hr tablet Take 5 mg by mouth daily.     glucose blood (ACCU-CHEK GUIDE) test strip Use as instructed 150 each 2   insulin glargine, 2 Unit Dial, (TOUJEO MAX SOLOSTAR) 300 UNIT/ML Solostar Pen Inject into the skin.     insulin lispro (HUMALOG KWIKPEN) 200 UNIT/ML KwikPen Inject into the skin.     Insulin Lispro Prot & Lispro (HUMALOG MIX 75/25 KWIKPEN) (75-25) 100 UNIT/ML Kwikpen Inject 70  Units into the skin 2 (two) times daily before a meal. 45 mL 2   isosorbide mononitrate (IMDUR) 30 MG 24 hr tablet Take 0.5 tablets (15 mg total) by mouth daily. 15 tablet 6   lisinopril (ZESTRIL) 5 MG tablet TAKE 1 TABLET BY MOUTH DAILY 90 tablet 2   meloxicam (MOBIC) 7.5 MG tablet Take by mouth.     metFORMIN (GLUCOPHAGE) 1000 MG tablet Take 1,000 mg by mouth 2 (two) times daily.     metFORMIN (GLUCOPHAGE) 500 MG tablet TAKE 1 TABLET BY MOUTH TWICE DAILY (Patient taking differently: Take 1,000 mg by mouth 2 (two) times daily with a meal.) 60 tablet 3   methocarbamol (ROBAXIN) 500 MG tablet Take 500 mg by mouth 2 (two) times daily.     metoCLOPramide (REGLAN) 5 MG tablet Take 5 mg by mouth 3 (three) times daily as needed for nausea or vomiting.   1   metoprolol succinate (TOPROL-XL) 100 MG 24 hr tablet Take 100 mg by mouth daily. Take with or immediately following a meal.     montelukast (SINGULAIR) 10 MG tablet Take 10 mg by mouth at bedtime.      morphine (MS CONTIN) 30 MG 12 hr tablet Take 1 tablet (30 mg total) by mouth every 12 (twelve) hours. Do Not Fill Before 11/03/2021 60 tablet 0   nitroGLYCERIN (NITROSTAT) 0.4 MG SL tablet Place 1 tablet (0.4 mg total) under the tongue every 5 (five) minutes as needed for chest pain (up to 3 doses. If taking 3rd dose call 911). 25 tablet 3   ondansetron (ZOFRAN) 4 MG tablet Take 4 mg by mouth every other day.     pantoprazole (PROTONIX) 40 MG tablet TAKE 1 TABLET BY MOUTH TWICE DAILY BEFORE MEALS (Patient taking differently: Take 40 mg by mouth 2 (two) times daily before a meal.) 60 tablet 1   polyethylene glycol (MIRALAX / GLYCOLAX) 17 g packet Take 17 g by mouth daily as needed for moderate constipation.     potassium chloride (KLOR-CON) 10 MEQ tablet Take 10 mEq by mouth 2 (two) times daily.     PROAIR HFA 108 (90 Base) MCG/ACT inhaler Inhale 2 puffs into the lungs every 4 (four) hours as needed for wheezing or shortness of breath.   8   promethazine  (PHENERGAN) 25 MG tablet Take 1 tablet (25 mg total) by mouth 4 (four) times daily as needed. (Patient  taking differently: Take 25 mg by mouth 4 (four) times daily as needed for nausea or vomiting.) 30 tablet 0   sucralfate (CARAFATE) 1 g tablet      SURE COMFORT INS SYR 1CC/30G 30G X 5/16" 1 ML MISC 1 each by Other route daily.     topiramate (TOPAMAX) 200 MG tablet Take 200 mg by mouth at bedtime.  3   No current facility-administered medications for this visit.     Past Surgical History:  Procedure Laterality Date   ABDOMINAL HYSTERECTOMY     Arm Surgery Bilateral    due to fall-pt broke both forearms   BIOPSY N/A 07/01/2013   Procedure: BIOPSY;  Surgeon: Rogene Houston, MD;  Location: [facility];  Service: Endoscopy;  Laterality: N/A;   CHOLECYSTECTOMY     COLONOSCOPY  08/06/2011   Procedure: COLONOSCOPY;  Surgeon: Rogene Houston, MD;  Location: [facility];  Service: Endoscopy;  Laterality: N/A;  215   COLONOSCOPY WITH PROPOFOL N/A 12/31/2012   Procedure: COLONOSCOPY WITH PROPOFOL;  Surgeon: Rogene Houston, MD;  Location: [facility];  Service: Endoscopy;  Laterality: N/A;  in cecum at 0814, total withdrawal time 78mn   COLONOSCOPY WITH PROPOFOL N/A 02/05/2018   Procedure: COLONOSCOPY WITH PROPOFOL;  Surgeon: RRogene Houston MD;  [facility]: [facility];  Service: Endoscopy;  Laterality: N/A;  1:25   CORONARY ANGIOPLASTY WITH STENT PLACEMENT     CYSTOSCOPY W/ URETERAL STENT PLACEMENT [place]10/2019   [place] 12/22/2019   Procedure: CYSTOSCOPY WITH RIGHT URETERAL STENT REMOVAL; RIGHT RETROGRADE PYELOGRAM,RIGHT URETEROSCOPY;  Surgeon: MCleon Gustin MD;  Location: [facility];  Service: Urology;  Laterality: Right;   ESOPHAGEAL DILATION N/A 06/07/2015   Procedure: ESOPHAGEAL DILATION;  Surgeon: NRogene Houston MD;  [facility];  Service: Endoscopy;  Laterality: N/A;   ESOPHAGOGASTRODUODENOSCOPY  N/A 06/07/2015   Procedure: ESOPHAGOGASTRODUODENOSCOPY (EGD);  Surgeon: NRogene Houston MD;  [facility];  Service: Endoscopy;  Laterality: N/A;  2:45 - moved to 1:00 - Ann to notify pt   ESOPHAGOGASTRODUODENOSCOPY (EGD) WITH PROPOFOL N/A 12/31/2012   Procedure: ESOPHAGOGASTRODUODENOSCOPY (EGD) WITH PROPOFOL;  Surgeon: NRogene Houston MD;  Location: [facility];  Service: Endoscopy;  Laterality: N/A;  GE junction 37,   ESOPHAGOGASTRODUODENOSCOPY (EGD) WITH PROPOFOL N/A 07/01/2013   Procedure: ESOPHAGOGASTRODUODENOSCOPY (EGD) WITH PROPOFOL;  Surgeon: NRogene Houston MD;  Location: [facility];  Service: Endoscopy;  Laterality: N/A;  [id number] 12/31/2012   Procedure: MALONEY DILATION;  Surgeon: NRogene Houston MD;  Location: [facility];  Service: Endoscopy;  Laterality: N/A;  used a # 54,#56   MALONEY DILATION N/A 07/01/2013   Procedure: MALONEY DILATION;  Surgeon: NRogene Houston MD;  Location: [facility];  Service: Endoscopy;  Laterality: N/A;  54/58; no heme present   POLYPECTOMY  02/05/2018   Procedure: POLYPECTOMY;  Surgeon: RRogene Houston MD;  [facility];  Service: Endoscopy;;  distal rectum (HS)   STONE EXTRACTION WITH BASKET Right 12/22/2019   Procedure: STONE EXTRACTION WITH BASKET;  Surgeon: MCleon Gustin MD;  Location: [facility];  Service: Urology;  Laterality: Right;     Allergies  Allergen Reactions   Amphetamine-Dextroamphetamine Swelling   Nitrofuran Derivatives Itching and Swelling   Amphetamine-Dextroamphet Er Swelling   Pregabalin Swelling   Topiramate Other (See Comments)    Tongue tingle   Verelan [Verapamil] Rash  Family History  Problem Relation Age of Onset   Heart attack Mother    Colon cancer Mother    Heart attack Father    Stroke Sister    Hemophilia Child    Cancer Other    Coronary artery disease Other    Cancer Brother    Cancer Maternal Aunt    Cancer Maternal Uncle    Cancer Paternal Aunt    Cancer Paternal  Uncle    Cancer Brother      Social History Ms. Boer reports that she has never smoked. She has never used smokeless tobacco. Ms. Osorno reports no history of alcohol use.   Review of Systems CONSTITUTIONAL: No weight loss, fever, chills, weakness or fatigue.  HEENT: Eyes: No visual loss, blurred vision, double vision or yellow sclerae.No hearing loss, sneezing, congestion, runny nose or sore throat.  SKIN: No rash or itching.  CARDIOVASCULAR:  RESPIRATORY: No shortness of breath, cough or sputum.  GASTROINTESTINAL: No anorexia, nausea, vomiting or diarrhea. No abdominal pain or blood.  GENITOURINARY: No burning on urination, no polyuria NEUROLOGICAL: No headache, dizziness, syncope, paralysis, ataxia, numbness or tingling in the extremities. No change in bowel or bladder control.  MUSCULOSKELETAL: No muscle, back pain, joint pain or stiffness.  LYMPHATICS: No enlarged nodes. No history of splenectomy.  PSYCHIATRIC: No history of depression or anxiety.  ENDOCRINOLOGIC: No reports of sweating, cold or heat intolerance. No polyuria or polydipsia.  Marland Kitchen   Physical Examination There were no vitals filed for this visit. There were no vitals filed for this visit.  Gen: resting comfortably, no acute distress HEENT: no scleral icterus, pupils equal round and reactive, no palptable cervical adenopathy,  CV Resp: Clear to auscultation bilaterally GI: abdomen is soft, non-tender, non-distended, normal bowel sounds, no hepatosplenomegaly MSK: extremities are warm, no edema.  Skin: warm, no rash Neuro:  no focal deficits Psych: appropriate affect   Diagnostic Studies Echocardiogram 04/05/2019 ([place]):  Summary   1. The left ventricle is normal in size with mildly increased wall thickness.   2. The left ventricular systolic function is normal, LVEF is visually estimated at > 55%. Grade 2 diastolic dysfunction.   3. The left atrium is mildly dilated in size.   4. The right  ventricle is mildly dilated in size, with normal systolic function.   5. The right atrium is mildly dilated  in size.     08/2016 nuclear stress There was no ST segment deviation noted during stress. The study is normal. This is a low risk study. Nuclear stress EF: 68%.     04/2020 30 day monitor - no arrhytnmias     08/2020 nuclear stress No diagnostic ST segment changes to indicate ischemia. Very small, mild intensity, apical anteroseptal defect that is reversible and consistent with either variable breast attenuation or small ischemic territory. This is an intermediate risk study based on calculated ejection fraction, otherwise low risk in terms of perfusion imaging. Suggest echocardiogram for corroboration. Nuclear stress EF: 45%. There was no ST segment deviation noted during stress.    Assessment and Plan     1. CAD  - chronic chest pains, recent nuclear stress without significant ischemia - pain can improve with antacid but also NG - trial of imdur 19m daily, perhaps vasospasm or microvasc disease.  - EKG shows SR, no acute ischemic changes   2. Acute on chronic diastolic HF - volume up, increase lasix to 643mdaily. Check bmet/mg/bnp in 2 weeks   3.  HTN - elevated bps, monitor with additional diuresis for now     Arnoldo Lenis, M.D.

## 2021-11-15 DIAGNOSIS — I509 Heart failure, unspecified: Secondary | ICD-10-CM | POA: Diagnosis not present

## 2021-11-15 DIAGNOSIS — R0902 Hypoxemia: Secondary | ICD-10-CM | POA: Diagnosis not present

## 2021-11-15 DIAGNOSIS — G4733 Obstructive sleep apnea (adult) (pediatric): Secondary | ICD-10-CM | POA: Diagnosis not present

## 2021-11-23 ENCOUNTER — Other Ambulatory Visit: Payer: Self-pay | Admitting: "Endocrinology

## 2021-11-27 DIAGNOSIS — Z299 Encounter for prophylactic measures, unspecified: Secondary | ICD-10-CM | POA: Diagnosis not present

## 2021-11-27 DIAGNOSIS — Z7189 Other specified counseling: Secondary | ICD-10-CM | POA: Diagnosis not present

## 2021-11-27 DIAGNOSIS — E7800 Pure hypercholesterolemia, unspecified: Secondary | ICD-10-CM | POA: Diagnosis not present

## 2021-11-27 DIAGNOSIS — Z23 Encounter for immunization: Secondary | ICD-10-CM | POA: Diagnosis not present

## 2021-11-27 DIAGNOSIS — R5383 Other fatigue: Secondary | ICD-10-CM | POA: Diagnosis not present

## 2021-11-27 DIAGNOSIS — Z79899 Other long term (current) drug therapy: Secondary | ICD-10-CM | POA: Diagnosis not present

## 2021-11-27 DIAGNOSIS — I10 Essential (primary) hypertension: Secondary | ICD-10-CM | POA: Diagnosis not present

## 2021-11-27 DIAGNOSIS — Z0000 Encounter for general adult medical examination without abnormal findings: Secondary | ICD-10-CM | POA: Diagnosis not present

## 2021-11-28 DIAGNOSIS — M179 Osteoarthritis of knee, unspecified: Secondary | ICD-10-CM | POA: Diagnosis not present

## 2021-11-28 DIAGNOSIS — R296 Repeated falls: Secondary | ICD-10-CM | POA: Diagnosis not present

## 2021-12-03 DIAGNOSIS — K858 Other acute pancreatitis without necrosis or infection: Secondary | ICD-10-CM | POA: Diagnosis not present

## 2021-12-03 DIAGNOSIS — K3184 Gastroparesis: Secondary | ICD-10-CM | POA: Diagnosis not present

## 2021-12-03 DIAGNOSIS — M542 Cervicalgia: Secondary | ICD-10-CM | POA: Diagnosis not present

## 2021-12-03 DIAGNOSIS — M25552 Pain in left hip: Secondary | ICD-10-CM | POA: Diagnosis not present

## 2021-12-03 DIAGNOSIS — K589 Irritable bowel syndrome without diarrhea: Secondary | ICD-10-CM | POA: Diagnosis not present

## 2021-12-03 DIAGNOSIS — Z79891 Long term (current) use of opiate analgesic: Secondary | ICD-10-CM | POA: Diagnosis not present

## 2021-12-03 DIAGNOSIS — M179 Osteoarthritis of knee, unspecified: Secondary | ICD-10-CM | POA: Diagnosis not present

## 2021-12-03 DIAGNOSIS — G894 Chronic pain syndrome: Secondary | ICD-10-CM | POA: Diagnosis not present

## 2021-12-03 DIAGNOSIS — M546 Pain in thoracic spine: Secondary | ICD-10-CM | POA: Diagnosis not present

## 2021-12-03 DIAGNOSIS — Z79899 Other long term (current) drug therapy: Secondary | ICD-10-CM | POA: Diagnosis not present

## 2021-12-04 ENCOUNTER — Encounter: Payer: Medicare Other | Attending: Registered Nurse | Admitting: Registered Nurse

## 2021-12-04 DIAGNOSIS — G894 Chronic pain syndrome: Secondary | ICD-10-CM | POA: Insufficient documentation

## 2021-12-04 DIAGNOSIS — M542 Cervicalgia: Secondary | ICD-10-CM | POA: Insufficient documentation

## 2021-12-04 DIAGNOSIS — M5416 Radiculopathy, lumbar region: Secondary | ICD-10-CM | POA: Insufficient documentation

## 2021-12-04 DIAGNOSIS — Z5181 Encounter for therapeutic drug level monitoring: Secondary | ICD-10-CM | POA: Insufficient documentation

## 2021-12-04 DIAGNOSIS — M546 Pain in thoracic spine: Secondary | ICD-10-CM | POA: Insufficient documentation

## 2021-12-04 DIAGNOSIS — M1712 Unilateral primary osteoarthritis, left knee: Secondary | ICD-10-CM | POA: Insufficient documentation

## 2021-12-04 DIAGNOSIS — M48062 Spinal stenosis, lumbar region with neurogenic claudication: Secondary | ICD-10-CM | POA: Insufficient documentation

## 2021-12-04 DIAGNOSIS — Z79891 Long term (current) use of opiate analgesic: Secondary | ICD-10-CM | POA: Insufficient documentation

## 2021-12-04 DIAGNOSIS — M1711 Unilateral primary osteoarthritis, right knee: Secondary | ICD-10-CM | POA: Insufficient documentation

## 2021-12-04 DIAGNOSIS — M7061 Trochanteric bursitis, right hip: Secondary | ICD-10-CM | POA: Insufficient documentation

## 2021-12-04 DIAGNOSIS — M7062 Trochanteric bursitis, left hip: Secondary | ICD-10-CM | POA: Insufficient documentation

## 2021-12-04 DIAGNOSIS — M5412 Radiculopathy, cervical region: Secondary | ICD-10-CM | POA: Insufficient documentation

## 2021-12-09 ENCOUNTER — Other Ambulatory Visit: Payer: Self-pay | Admitting: Registered Nurse

## 2021-12-10 DIAGNOSIS — Z713 Dietary counseling and surveillance: Secondary | ICD-10-CM | POA: Diagnosis not present

## 2021-12-10 DIAGNOSIS — I10 Essential (primary) hypertension: Secondary | ICD-10-CM | POA: Diagnosis not present

## 2021-12-10 DIAGNOSIS — E1165 Type 2 diabetes mellitus with hyperglycemia: Secondary | ICD-10-CM | POA: Diagnosis not present

## 2021-12-10 DIAGNOSIS — Z299 Encounter for prophylactic measures, unspecified: Secondary | ICD-10-CM | POA: Diagnosis not present

## 2021-12-12 DIAGNOSIS — L97511 Non-pressure chronic ulcer of other part of right foot limited to breakdown of skin: Secondary | ICD-10-CM | POA: Diagnosis not present

## 2021-12-12 DIAGNOSIS — E1142 Type 2 diabetes mellitus with diabetic polyneuropathy: Secondary | ICD-10-CM | POA: Diagnosis not present

## 2021-12-12 DIAGNOSIS — L03032 Cellulitis of left toe: Secondary | ICD-10-CM | POA: Diagnosis not present

## 2021-12-15 DIAGNOSIS — I509 Heart failure, unspecified: Secondary | ICD-10-CM | POA: Diagnosis not present

## 2021-12-15 DIAGNOSIS — R0902 Hypoxemia: Secondary | ICD-10-CM | POA: Diagnosis not present

## 2021-12-15 DIAGNOSIS — G4733 Obstructive sleep apnea (adult) (pediatric): Secondary | ICD-10-CM | POA: Diagnosis not present

## 2021-12-31 DIAGNOSIS — M25552 Pain in left hip: Secondary | ICD-10-CM | POA: Diagnosis not present

## 2021-12-31 DIAGNOSIS — K589 Irritable bowel syndrome without diarrhea: Secondary | ICD-10-CM | POA: Diagnosis not present

## 2021-12-31 DIAGNOSIS — K3184 Gastroparesis: Secondary | ICD-10-CM | POA: Diagnosis not present

## 2021-12-31 DIAGNOSIS — M546 Pain in thoracic spine: Secondary | ICD-10-CM | POA: Diagnosis not present

## 2021-12-31 DIAGNOSIS — M542 Cervicalgia: Secondary | ICD-10-CM | POA: Diagnosis not present

## 2021-12-31 DIAGNOSIS — G894 Chronic pain syndrome: Secondary | ICD-10-CM | POA: Diagnosis not present

## 2021-12-31 DIAGNOSIS — M179 Osteoarthritis of knee, unspecified: Secondary | ICD-10-CM | POA: Diagnosis not present

## 2021-12-31 DIAGNOSIS — R109 Unspecified abdominal pain: Secondary | ICD-10-CM | POA: Diagnosis not present

## 2021-12-31 DIAGNOSIS — M797 Fibromyalgia: Secondary | ICD-10-CM | POA: Diagnosis not present

## 2021-12-31 DIAGNOSIS — M5440 Lumbago with sciatica, unspecified side: Secondary | ICD-10-CM | POA: Diagnosis not present

## 2022-01-01 DIAGNOSIS — E1159 Type 2 diabetes mellitus with other circulatory complications: Secondary | ICD-10-CM | POA: Diagnosis not present

## 2022-01-02 ENCOUNTER — Other Ambulatory Visit: Payer: Self-pay | Admitting: "Endocrinology

## 2022-01-02 DIAGNOSIS — L97511 Non-pressure chronic ulcer of other part of right foot limited to breakdown of skin: Secondary | ICD-10-CM | POA: Diagnosis not present

## 2022-01-02 LAB — COMPREHENSIVE METABOLIC PANEL
ALT: 11 IU/L (ref 0–32)
AST: 11 IU/L (ref 0–40)
Albumin/Globulin Ratio: 1.7 (ref 1.2–2.2)
Albumin: 3.5 g/dL — ABNORMAL LOW (ref 3.9–4.9)
Alkaline Phosphatase: 81 IU/L (ref 44–121)
BUN/Creatinine Ratio: 24 (ref 12–28)
BUN: 17 mg/dL (ref 8–27)
Bilirubin Total: 0.3 mg/dL (ref 0.0–1.2)
CO2: 26 mmol/L (ref 20–29)
Calcium: 8.6 mg/dL — ABNORMAL LOW (ref 8.7–10.3)
Chloride: 104 mmol/L (ref 96–106)
Creatinine, Ser: 0.71 mg/dL (ref 0.57–1.00)
Globulin, Total: 2.1 g/dL (ref 1.5–4.5)
Glucose: 70 mg/dL (ref 70–99)
Potassium: 4.7 mmol/L (ref 3.5–5.2)
Sodium: 140 mmol/L (ref 134–144)
Total Protein: 5.6 g/dL — ABNORMAL LOW (ref 6.0–8.5)
eGFR: 93 mL/min/{1.73_m2} (ref >59)

## 2022-01-02 LAB — LIPID PANEL
Chol/HDL Ratio: 2.7 ratio (ref 0.0–4.4)
Cholesterol, Total: 128 mg/dL (ref 100–199)
HDL: 48 mg/dL (ref >39)
LDL Chol Calc (NIH): 46 mg/dL (ref 0–99)
Triglycerides: 212 mg/dL — ABNORMAL HIGH (ref 0–149)
VLDL Cholesterol Cal: 34 mg/dL (ref 5–40)

## 2022-01-02 LAB — TSH: TSH: 3.59 u[IU]/mL (ref 0.450–4.500)

## 2022-01-02 LAB — T4, FREE: Free T4: 1.5 ng/dL (ref 0.82–1.77)

## 2022-01-08 ENCOUNTER — Ambulatory Visit: Payer: 59 | Admitting: "Endocrinology

## 2022-01-15 DIAGNOSIS — I509 Heart failure, unspecified: Secondary | ICD-10-CM | POA: Diagnosis not present

## 2022-01-15 DIAGNOSIS — R0902 Hypoxemia: Secondary | ICD-10-CM | POA: Diagnosis not present

## 2022-01-15 DIAGNOSIS — G4733 Obstructive sleep apnea (adult) (pediatric): Secondary | ICD-10-CM | POA: Diagnosis not present

## 2022-01-25 ENCOUNTER — Encounter (INDEPENDENT_AMBULATORY_CARE_PROVIDER_SITE_OTHER): Payer: Self-pay | Admitting: Gastroenterology

## 2022-01-27 ENCOUNTER — Ambulatory Visit (INDEPENDENT_AMBULATORY_CARE_PROVIDER_SITE_OTHER): Payer: Medicare Other | Admitting: "Endocrinology

## 2022-01-27 ENCOUNTER — Encounter: Payer: Self-pay | Admitting: "Endocrinology

## 2022-01-27 VITALS — BP 144/84 | HR 84 | Ht 64.0 in | Wt 247.4 lb

## 2022-01-27 DIAGNOSIS — E782 Mixed hyperlipidemia: Secondary | ICD-10-CM | POA: Diagnosis not present

## 2022-01-27 DIAGNOSIS — E559 Vitamin D deficiency, unspecified: Secondary | ICD-10-CM | POA: Diagnosis not present

## 2022-01-27 DIAGNOSIS — E1159 Type 2 diabetes mellitus with other circulatory complications: Secondary | ICD-10-CM | POA: Diagnosis not present

## 2022-01-27 DIAGNOSIS — I10 Essential (primary) hypertension: Secondary | ICD-10-CM | POA: Diagnosis not present

## 2022-01-27 LAB — POCT GLYCOSYLATED HEMOGLOBIN (HGB A1C): HbA1c, POC (controlled diabetic range): 9 % — AB (ref 0.0–7.0)

## 2022-01-27 MED ORDER — INSULIN LISPRO PROT & LISPRO (75-25 MIX) 100 UNIT/ML KWIKPEN: 80.0000 [IU] | PEN_INJECTOR | Freq: Two times a day (BID) | SUBCUTANEOUS | 3 refills | Status: DC

## 2022-01-27 MED ORDER — ACCU-CHEK GUIDE ME W/DEVICE KIT: 1.0000 | PACK | 0 refills | Status: AC

## 2022-01-27 NOTE — Progress Notes (Signed)
01/27/2022  Endocrinology follow-up note   Subjective:    Patient ID: [name], female    DOB: 1950/08/09. Patient is returning with incomplete log for glycemic profile.  Her documented readings and her meter average history significant above Target.  Past Medical History:  Diagnosis Date   Anxiety and depression    Arthritis    CAD (coronary artery disease)    Stent placement circumflex coronary 2007, catheterization 2008 patent stents. 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    High cholesterol    Hx of blood clots    Hypertension    MI (myocardial infarction) ([place])    2007   Obstructive sleep apnea    CPAP, setting ?2   Palpitations    Sleep apnea    Tachycardia    Past Surgical History:  Procedure Laterality Date   ABDOMINAL HYSTERECTOMY     Arm Surgery Bilateral    due to fall-pt broke both forearms   BIOPSY N/A 07/01/2013   Procedure: BIOPSY;  Surgeon: Rogene Houston, MD;  Location: [facility];  Service: Endoscopy;  Laterality: N/A;   CHOLECYSTECTOMY     COLONOSCOPY  08/06/2011   Procedure: COLONOSCOPY;  Surgeon: Rogene Houston, MD;  [facility]: [facility];  Service: Endoscopy;  Laterality: N/A;  215   COLONOSCOPY WITH PROPOFOL N/A 12/31/2012   Procedure: COLONOSCOPY WITH PROPOFOL;  Surgeon: Rogene Houston, MD;  Location: [facility];  Service: Endoscopy;  Laterality: N/A;  in cecum at 0814, total withdrawal time 10mn   COLONOSCOPY WITH PROPOFOL N/A 02/05/2018   Procedure: COLONOSCOPY WITH PROPOFOL;  Surgeon: RRogene Houston MD;  [facility];  Service: Endoscopy;  Laterality: N/A;  1:25    CORONARY ANGIOPLASTY WITH STENT PLACEMENT     CYSTOSCOPY W/ URETERAL STENT PLACEMENT Right 11/10/2019   at [facility] URETEROSCOPY AND STENT PLACEMENT Right 12/22/2019   Procedure: CYSTOSCOPY WITH RIGHT URETERAL STENT REMOVAL; RIGHT RETROGRADE PYELOGRAM,RIGHT URETEROSCOPY;  Surgeon: MCleon Gustin MD;  Location: [facility];  Service: Urology;  Laterality: Right;   ESOPHAGEAL DILATION N/A 06/07/2015   Procedure: ESOPHAGEAL DILATION;  Surgeon: NRogene Houston MD;  [facility];  Service: Endoscopy;  Laterality: N/A;   ESOPHAGOGASTRODUODENOSCOPY N/A 06/07/2015   Procedure: ESOPHAGOGASTRODUODENOSCOPY (EGD);  Surgeon: NRogene Houston MD;  [facility];  Service: Endoscopy;  Laterality: N/A;  2:45 - moved to 1:00 - Ann to notify pt   ESOPHAGOGASTRODUODENOSCOPY (EGD) WITH PROPOFOL N/A 12/31/2012   Procedure: ESOPHAGOGASTRODUODENOSCOPY (EGD) WITH PROPOFOL;  Surgeon: NRogene Houston MD;  Location: [facility];  Service: Endoscopy;  Laterality: N/A;  GE junction 37,   ESOPHAGOGASTRODUODENOSCOPY (EGD) WITH PROPOFOL N/A 07/01/2013   Procedure: ESOPHAGOGASTRODUODENOSCOPY (EGD) WITH PROPOFOL;  Surgeon: NRogene Houston MD;  [facility];  Service: Endoscopy;  Laterality: N/A;  [place] 12/[place]/2012   Procedure: MALONEY DILATION;  Surgeon: NRogene Houston MD;  Location: [facility];  Service: Endoscopy;  Laterality: N/A;  used  a # 54,#56   MALONEY DILATION N/A 07/01/2013   Procedure: MALONEY DILATION;  Surgeon: Rogene Houston, MD;  Location: [facility];  Service: Endoscopy;  Laterality: N/A;  54/58; no heme present   POLYPECTOMY  02/05/2018   Procedure: POLYPECTOMY;  Surgeon: Rogene Houston, MD;  [facility];  Service: Endoscopy;;  distal rectum (HS)   STONE EXTRACTION WITH BASKET Right 12/22/2019   Procedure: STONE EXTRACTION WITH BASKET;  Surgeon: Cleon Gustin, MD;  Location: [facility];  Service: Urology;  Laterality: Right;    Social History   Socioeconomic History   Marital status: Divorced    Spouse name: Not on file   Number of children: 4   Years of education: Not on file   Highest education level: Not on file  Occupational History   Occupation: Disabled  Tobacco Use   Smoking status: Never   Smokeless tobacco: Never  Vaping Use   Vaping Use: Never used  Substance and Sexual Activity   Alcohol use: No    Alcohol/week: 0.0 standard drinks of alcohol   Drug use: No   Sexual activity: Not on file  Other Topics Concern   Not on file  Social History Narrative   Patient does not get regular exercise   Denies caffeine use    Social Determinants of Health   Financial Resource Strain: Not on file  Food Insecurity: Not on file  Transportation Needs: Not on file  Physical Activity: Not on file  Stress: Not on file  Social Connections: Not on file   Outpatient Encounter Medications as of 01/27/2022  Medication Sig   oxyCODONE-acetaminophen (PERCOCET) 7.5-325 MG tablet Take 1 tablet by mouth every 6 (six) hours as needed.   ALPRAZolam (XANAX) 0.5 MG tablet Take 0.5 mg by mouth 2 (two) times daily as needed for anxiety or sleep.   Armodafinil 150 MG tablet Take 150 mg by mouth daily.   aspirin EC 81 MG tablet Take 1 tablet (81 mg total) by mouth daily.   atorvastatin (LIPITOR) 40 MG tablet Take 40 mg by mouth daily.   B-D ULTRAFINE III SHORT PEN 31G X 8 MM MISC Inject into the skin 4 (four) times daily.   Blood Glucose Monitoring Suppl (ACCU-CHEK GUIDE ME) w/Device KIT 1 Piece by Does not apply route as directed.   buPROPion (WELLBUTRIN SR) 100 MG 12 hr tablet Take 100 mg by mouth daily.   Cholecalciferol (VITAMIN D3) 125 MCG (5000 UT) CAPS TAKE ONE CAPSULE BY MOUTH DAILY (Patient taking differently: Take 5,000 Units by mouth daily.)   diclofenac Sodium (VOLTAREN) 1 % GEL Apply topically. (Patient not taking: Reported on 10/17/2021)   diltiazem (CARDIZEM CD) 240 MG 24 hr capsule Take 240 mg by mouth  daily.   doxepin (SINEQUAN) 25 MG capsule Take 75 mg by mouth at bedtime.    estradiol (ESTRACE) 0.5 MG tablet Take 0.5 mg by mouth daily.    ferrous sulfate 325 (65 FE) MG EC tablet Take by mouth.   furosemide (LASIX) 40 MG tablet Take 1.5 tablets (60 mg total) by mouth daily.   gabapentin (NEURONTIN) 400 MG capsule Take 600 mg by mouth 3 (three) times daily.   glipiZIDE (GLUCOTROL XL) 5 MG 24 hr tablet Take 5 mg by mouth daily.   glucose blood (ACCU-CHEK GUIDE) test strip USE AS DIRECTED   Insulin Lispro Prot & Lispro (HUMALOG 75/25 MIX) (75-25) 100 UNIT/ML Kwikpen Inject 80 Units into the skin 2 (two) times daily before a  meal.   isosorbide mononitrate (IMDUR) 30 MG 24 hr tablet Take 0.5 tablets (15 mg total) by mouth daily.   lisinopril (ZESTRIL) 5 MG tablet TAKE 1 TABLET BY MOUTH DAILY   meloxicam (MOBIC) 7.5 MG tablet Take by mouth.   metFORMIN (GLUCOPHAGE) 1000 MG tablet Take 1,000 mg by mouth 2 (two) times daily.   metFORMIN (GLUCOPHAGE) 500 MG tablet TAKE 1 TABLET BY MOUTH TWICE DAILY (Patient taking differently: Take 1,000 mg by mouth 2 (two) times daily with a meal.)   methocarbamol (ROBAXIN) 500 MG tablet Take 500 mg by mouth 2 (two) times daily.   metoCLOPramide (REGLAN) 5 MG tablet Take 5 mg by mouth 3 (three) times daily as needed for nausea or vomiting.    metoprolol succinate (TOPROL-XL) 100 MG 24 hr tablet Take 100 mg by mouth daily. Take with or immediately following a meal.   montelukast (SINGULAIR) 10 MG tablet Take 10 mg by mouth at bedtime.    nitroGLYCERIN (NITROSTAT) 0.4 MG SL tablet Place 1 tablet (0.4 mg total) under the tongue every 5 (five) minutes as needed for chest pain (up to 3 doses. If taking 3rd dose call 911).   ondansetron (ZOFRAN) 4 MG tablet Take 4 mg by mouth every other day.   pantoprazole (PROTONIX) 40 MG tablet TAKE 1 TABLET BY MOUTH TWICE DAILY BEFORE MEALS (Patient taking differently: Take 40 mg by mouth 2 (two) times daily before a meal.)    polyethylene glycol (MIRALAX / GLYCOLAX) 17 g packet Take 17 g by mouth daily as needed for moderate constipation.   potassium chloride (KLOR-CON) 10 MEQ tablet Take 10 mEq by mouth 2 (two) times daily.   PROAIR HFA 108 (90 Base) MCG/ACT inhaler Inhale 2 puffs into the lungs every 4 (four) hours as needed for wheezing or shortness of breath.    promethazine (PHENERGAN) 25 MG tablet Take 1 tablet (25 mg total) by mouth 4 (four) times daily as needed. (Patient taking differently: Take 25 mg by mouth 4 (four) times daily as needed for nausea or vomiting.)   sucralfate (CARAFATE) 1 g tablet    SURE COMFORT INS SYR 1CC/30G 30G X 5/16" 1 ML MISC 1 each by Other route daily.   topiramate (TOPAMAX) 200 MG tablet Take 200 mg by mouth at bedtime.   [DISCONTINUED] Blood Glucose Monitoring Suppl (ACCU-CHEK GUIDE ME) w/Device KIT 1 Piece by Does not apply route as directed.   [DISCONTINUED] insulin glargine, 2 Unit Dial, (TOUJEO MAX SOLOSTAR) 300 UNIT/ML Solostar Pen Inject into the skin.   [DISCONTINUED] insulin lispro (HUMALOG KWIKPEN) 200 UNIT/ML KwikPen Inject into the skin.   [DISCONTINUED] Insulin Lispro Prot & Lispro (HUMALOG 75/25 MIX) (75-25) 100 UNIT/ML Kwikpen INJECT 70 UNITS SUBCUTANEOUSLY TWICE DAILY BEFORE A MEAL (Patient taking differently: 84 Units 2 (two) times daily with a meal.)   [DISCONTINUED] morphine (MS CONTIN) 30 MG 12 hr tablet Take 1 tablet (30 mg total) by mouth every 12 (twelve) hours. Do Not Fill Before 11/03/2021   No facility-administered encounter medications on file as of 01/27/2022.   ALLERGIES: Allergies  Allergen Reactions   Amphetamine-Dextroamphetamine Swelling   Nitrofuran Derivatives Itching and Swelling   Amphetamine-Dextroamphet Er Swelling   Pregabalin Swelling   Topiramate Other (See Comments)    Tongue tingle   Verelan [Verapamil] Rash   VACCINATION STATUS:  There is no immunization history on file for this patient.  Diabetes She presents for her  follow-up diabetic visit. She has type 2 diabetes mellitus. Onset time: She  was diagnosed at approximate age of 74 years. Her disease course has been improving. There are no hypoglycemic associated symptoms. Pertinent negatives for hypoglycemia include no confusion, headaches, pallor or seizures. Pertinent negatives for diabetes include no chest pain, no fatigue, no polydipsia, no polyphagia and no polyuria. There are no hypoglycemic complications. Symptoms are improving. Diabetic complications include autonomic neuropathy, heart disease, peripheral neuropathy, PVD and retinopathy. Risk factors for coronary artery disease include dyslipidemia, diabetes mellitus, obesity, hypertension and sedentary lifestyle. Current diabetic treatments: Lantus 65 units daily at bedtime, Humalog 15 units 3 times a day before meals. She mentions allergy to Victoza, Byetta, and metformin. Her weight is fluctuating minimally. She is following a generally unhealthy diet. When asked about meal planning, she reported none. She has not had a previous visit with a dietitian. She never participates in exercise. Her home blood glucose trend is fluctuating minimally. Her breakfast blood glucose range is generally 180-200 mg/dl. Her lunch blood glucose range is generally >200 mg/dl. Her dinner blood glucose range is generally >200 mg/dl. Her bedtime blood glucose range is generally >200 mg/dl. Her overall blood glucose range is >200 mg/dl. (She presents with her meter showing incomplete glycemic profile.  Her point-of-care A1c is 9%, improving from 9.9% during her last visit.   She did not document any hypoglycemia. ) An ACE inhibitor/angiotensin II receptor blocker is not being taken. Eye exam is current.  Hyperlipidemia This is a chronic problem. The current episode started more than 1 year ago. The problem is uncontrolled. Recent lipid tests were reviewed and are high. Exacerbating diseases include diabetes and obesity. Pertinent negatives  include no chest pain, myalgias or shortness of breath. Current antihyperlipidemic treatment includes statins. Risk factors for coronary artery disease include diabetes mellitus, dyslipidemia, hypertension, obesity and a sedentary lifestyle.  Hypertension This is a chronic problem. The current episode started more than 1 year ago. Pertinent negatives include no chest pain, headaches, palpitations or shortness of breath. Risk factors for coronary artery disease include diabetes mellitus, dyslipidemia, obesity and sedentary lifestyle. Hypertensive end-organ damage includes CAD/MI, PVD and retinopathy.    Review of systems: Limited as above.  Objective:    BP (!) 144/84   Pulse 84   Ht 5' 4" (1.626 m)   Wt 247 lb 6.4 oz (112.2 kg)   BMI 42.47 kg/m   Wt Readings from Last 3 Encounters:  01/27/22 247 lb 6.4 oz (112.2 kg)  10/17/21 250 lb (113.4 kg)  10/08/21 245 lb (111.1 kg)     CMP     Component Value Date/Time   NA 140 01/01/2022 1357   K 4.7 01/01/2022 1357   CL 104 01/01/2022 1357   CO2 26 01/01/2022 1357   GLUCOSE 70 01/01/2022 1357   GLUCOSE 233 (H) 08/08/2020 0244   BUN 17 01/01/2022 1357   CREATININE 0.71 01/01/2022 1357   CREATININE 0.70 11/02/2018 1105   CALCIUM 8.6 (L) 01/01/2022 1357   PROT 5.6 (L) 01/01/2022 1357   ALBUMIN 3.5 (L) 01/01/2022 1357   AST 11 01/01/2022 1357   ALT 11 01/01/2022 1357   ALKPHOS 81 01/01/2022 1357   BILITOT 0.3 01/01/2022 1357   GFRNONAA >60 08/08/2020 0244   GFRNONAA 91 11/02/2018 1105   GFRAA >60 10/27/2019 1130   GFRAA 105 11/02/2018 1105   Diabetic Labs (most recent): Lab Results  Component Value Date   HGBA1C 9.0 (A) 01/27/2022   HGBA1C 9.9 (A) 09/03/2021   HGBA1C 9.1 (A) 04/18/2021   MICROALBUR <0.2 02/09/2017  Lipid Panel     Component Value Date/Time   CHOL 128 01/01/2022 1357   TRIG 212 (H) 01/01/2022 1357   HDL 48 01/01/2022 1357   CHOLHDL 2.7 01/01/2022 1357   CHOLHDL 3.1 08/08/2020 0244   VLDL 24  08/08/2020 0244   LDLCALC 46 01/01/2022 1357   LDLCALC 90 07/06/2018 0936     Assessment & Plan:   1. Type 2 diabetes mellitus with vascular disease ([place]) - Patient has currently uncontrolled symptomatic type 2 DM since  71 years of age.  She presents with her meter showing incomplete glycemic profile.  Her point-of-care A1c is 9%, improving from 9.9% during her last visit.   She did not document any hypoglycemia.   Recent labs reviewed.  - Her diabetes is complicated by obesity/sedentary life, coronary artery disease status post stent placement and patient remains at a high risk for more acute and chronic complications of diabetes which include CAD, CVA, CKD, retinopathy, and neuropathy. These are all discussed in detail with the patient.  - I have counseled the patient on diet management and weight loss, by adopting a carbohydrate restricted/protein rich diet. - she acknowledges that there is a room for improvement in her food and drink choices. - Suggestion is made for her to avoid simple carbohydrates  from her diet including Cakes, Sweet Desserts, Ice Cream, Soda (diet and regular), Sweet Tea, Candies, Chips, Cookies, Store Bought Juices, Alcohol , Artificial Sweeteners,  Coffee Creamer, and "Sugar-free" Products, Lemonade. This will help patient to have more stable blood glucose profile and potentially avoid unintended weight gain.  The following Lifestyle Medicine recommendations according to [place]  [facility]) were discussed and and offered to patient and she  agrees to start the journey:  A. Whole Foods, Plant-Based Nutrition comprising of fruits and vegetables, plant-based proteins, whole-grain carbohydrates was discussed in detail with the patient.   A list for source of those nutrients were also provided to the patient.  Patient will use only water or unsweetened tea for hydration. B.  The need to stay away from risky substances including alcohol,  smoking; obtaining 7 to 9 hours of restorative sleep, at least 150 minutes of moderate intensity exercise weekly, the importance of healthy social connections,  and stress management techniques were discussed. C.  A full color page of  Calorie density of various food groups per pound showing examples of each food groups was provided to the patient.    - Patient is advised to stick to a routine mealtimes to eat 3 meals  a day and avoid unnecessary snacks ( to snack only to correct hypoglycemia).   - I have approached patient with the following individualized plan to manage diabetes and patient agrees:   -She presents with slight improvement in her glycemic profile.  She is advised to Increase  Humalog 75/25 to 80 units with breakfast and 80 units with supper  only if glucose readings are above 90 and only if she is eating.    She is advised and encouraged to continue monitoring blood glucose 4 times a day-daily before 3 meals and at bedtime.   -She is warned not to take insulin without proper monitoring of blood glucose.  -Her insurance did not provide coverage for CGM device.  -She continues to have normal renal function, she will continue to benefit from low-dose metformin- metformin 500 mg p.o. twice daily.    -Patient is encouraged to call clinic for blood glucose levels less than  70 or above 200 mg /dl.  -She has intolerance to GLP-1 receptor agonists.  He is advised to continue glipizide 5 mg XL p.o. daily at breakfast.  2) BP/HTN: Her blood pressure is not controlled to target.  She has adequate medications, advised to continue including metoprolol XL 50 mg p.o. daily.  3) Lipids/HPL: She has uncontrolled hypertriglyceridemia , proving to 212 from 439.  Her LDL is also improving to 46 from 77.  She is advised to continue Lipitor 40 mg daily nightly.  She is educated on the fact that   better control of diabetes will help for triglycerides.  She is also educated on the link between her  diet and her cholesterol levels.  She would benefit from WF PB diet in terms of her high cholesterol as well.   4) Chronic Care/Health Maintenance:  -Patient is on Statin medications and encouraged to continue to follow up with Ophthalmology, Podiatrist at least yearly or according to recommendations, and advised to   stay away from smoking. I have recommended yearly flu vaccine and pneumonia vaccination at least every 5 years;  and  sleep for at least 7 hours a day. -She will benefit from a set of diabetic shoes, paperwork filled for her.   - I advised patient to maintain close follow up with Glenda Chroman, MD for primary care needs.    I spent 41 minutes in the care of the patient today including review of labs from [place], Lipids, Thyroid Function, Hematology (current and previous including abstractions from other facilities); face-to-face time discussing  her blood glucose readings/logs, discussing hypoglycemia and hyperglycemia episodes and symptoms, medications doses, her options of short and long term treatment based on the latest standards of care / guidelines;  discussion about incorporating lifestyle medicine;  and documenting the encounter. Risk reduction counseling performed per USPSTF guidelines to reduce  obesity and cardiovascular risk factors.     Please refer to Patient Instructions for Blood Glucose Monitoring and Insulin/Medications Dosing Guide"  in media tab for additional information. Please  also refer to " Patient Self Inventory" in the Media  tab for reviewed elements of pertinent patient history.  [name] participated in the discussions, expressed understanding, and voiced agreement with the above plans.  All questions were answered to her satisfaction. she is encouraged to contact clinic should she have any questions or concerns prior to her return visit.   Follow up plan: - Patient to go to emergency room for better evaluation of chest pain. Return in about 4  months (around 05/28/2022) for Bring Meter/CGM Device/Logs- A1c in Office.  Glade Lloyd, MD Phone: [phone]  Fax: [phone]  This note was partially dictated with voice recognition software. Similar sounding words can be transcribed inadequately or may not  be corrected upon review.  01/27/2022, 1:05 PM

## 2022-01-27 NOTE — Patient Instructions (Signed)

## 2022-01-28 DIAGNOSIS — R109 Unspecified abdominal pain: Secondary | ICD-10-CM | POA: Diagnosis not present

## 2022-01-28 DIAGNOSIS — G894 Chronic pain syndrome: Secondary | ICD-10-CM | POA: Diagnosis not present

## 2022-01-28 DIAGNOSIS — K858 Other acute pancreatitis without necrosis or infection: Secondary | ICD-10-CM | POA: Diagnosis not present

## 2022-01-28 DIAGNOSIS — K589 Irritable bowel syndrome without diarrhea: Secondary | ICD-10-CM | POA: Diagnosis not present

## 2022-01-28 DIAGNOSIS — M179 Osteoarthritis of knee, unspecified: Secondary | ICD-10-CM | POA: Diagnosis not present

## 2022-01-28 DIAGNOSIS — M546 Pain in thoracic spine: Secondary | ICD-10-CM | POA: Diagnosis not present

## 2022-01-28 DIAGNOSIS — M5440 Lumbago with sciatica, unspecified side: Secondary | ICD-10-CM | POA: Diagnosis not present

## 2022-01-28 DIAGNOSIS — K3184 Gastroparesis: Secondary | ICD-10-CM | POA: Diagnosis not present

## 2022-01-28 DIAGNOSIS — M25552 Pain in left hip: Secondary | ICD-10-CM | POA: Diagnosis not present

## 2022-01-28 DIAGNOSIS — M2550 Pain in unspecified joint: Secondary | ICD-10-CM | POA: Diagnosis not present

## 2022-01-28 DIAGNOSIS — M542 Cervicalgia: Secondary | ICD-10-CM | POA: Diagnosis not present

## 2022-02-04 ENCOUNTER — Other Ambulatory Visit: Payer: Self-pay | Admitting: Cardiology

## 2022-02-25 DIAGNOSIS — K858 Other acute pancreatitis without necrosis or infection: Secondary | ICD-10-CM | POA: Diagnosis not present

## 2022-02-25 DIAGNOSIS — M797 Fibromyalgia: Secondary | ICD-10-CM | POA: Diagnosis not present

## 2022-02-25 DIAGNOSIS — M25552 Pain in left hip: Secondary | ICD-10-CM | POA: Diagnosis not present

## 2022-02-25 DIAGNOSIS — M179 Osteoarthritis of knee, unspecified: Secondary | ICD-10-CM | POA: Diagnosis not present

## 2022-02-25 DIAGNOSIS — K3184 Gastroparesis: Secondary | ICD-10-CM | POA: Diagnosis not present

## 2022-02-25 DIAGNOSIS — M546 Pain in thoracic spine: Secondary | ICD-10-CM | POA: Diagnosis not present

## 2022-02-25 DIAGNOSIS — M542 Cervicalgia: Secondary | ICD-10-CM | POA: Diagnosis not present

## 2022-02-25 DIAGNOSIS — K589 Irritable bowel syndrome without diarrhea: Secondary | ICD-10-CM | POA: Diagnosis not present

## 2022-02-25 DIAGNOSIS — M5440 Lumbago with sciatica, unspecified side: Secondary | ICD-10-CM | POA: Diagnosis not present

## 2022-02-25 DIAGNOSIS — G894 Chronic pain syndrome: Secondary | ICD-10-CM | POA: Diagnosis not present

## 2022-03-10 ENCOUNTER — Other Ambulatory Visit: Payer: Self-pay | Admitting: Cardiology

## 2022-03-29 ENCOUNTER — Other Ambulatory Visit: Payer: Self-pay | Admitting: "Endocrinology

## 2022-04-10 ENCOUNTER — Other Ambulatory Visit: Payer: Self-pay | Admitting: Cardiology

## 2022-04-17 DIAGNOSIS — I10 Essential (primary) hypertension: Secondary | ICD-10-CM | POA: Diagnosis not present

## 2022-04-17 DIAGNOSIS — Z299 Encounter for prophylactic measures, unspecified: Secondary | ICD-10-CM | POA: Diagnosis not present

## 2022-04-17 DIAGNOSIS — J449 Chronic obstructive pulmonary disease, unspecified: Secondary | ICD-10-CM | POA: Diagnosis not present

## 2022-04-17 DIAGNOSIS — Z09 Encounter for follow-up examination after completed treatment for conditions other than malignant neoplasm: Secondary | ICD-10-CM | POA: Diagnosis not present

## 2022-04-17 DIAGNOSIS — E1165 Type 2 diabetes mellitus with hyperglycemia: Secondary | ICD-10-CM | POA: Diagnosis not present

## 2022-04-21 DIAGNOSIS — E1165 Type 2 diabetes mellitus with hyperglycemia: Secondary | ICD-10-CM | POA: Diagnosis not present

## 2022-04-21 DIAGNOSIS — K3184 Gastroparesis: Secondary | ICD-10-CM | POA: Diagnosis not present

## 2022-04-21 DIAGNOSIS — I110 Hypertensive heart disease with heart failure: Secondary | ICD-10-CM | POA: Diagnosis not present

## 2022-04-21 DIAGNOSIS — I5032 Chronic diastolic (congestive) heart failure: Secondary | ICD-10-CM | POA: Diagnosis not present

## 2022-04-21 DIAGNOSIS — E1143 Type 2 diabetes mellitus with diabetic autonomic (poly)neuropathy: Secondary | ICD-10-CM | POA: Diagnosis not present

## 2022-04-21 DIAGNOSIS — N390 Urinary tract infection, site not specified: Secondary | ICD-10-CM | POA: Diagnosis not present

## 2022-04-22 ENCOUNTER — Other Ambulatory Visit: Payer: Self-pay | Admitting: Cardiology

## 2022-04-23 DIAGNOSIS — I5032 Chronic diastolic (congestive) heart failure: Secondary | ICD-10-CM | POA: Diagnosis not present

## 2022-04-23 DIAGNOSIS — K3184 Gastroparesis: Secondary | ICD-10-CM | POA: Diagnosis not present

## 2022-04-23 DIAGNOSIS — N390 Urinary tract infection, site not specified: Secondary | ICD-10-CM | POA: Diagnosis not present

## 2022-04-23 DIAGNOSIS — I110 Hypertensive heart disease with heart failure: Secondary | ICD-10-CM | POA: Diagnosis not present

## 2022-04-23 DIAGNOSIS — E1143 Type 2 diabetes mellitus with diabetic autonomic (poly)neuropathy: Secondary | ICD-10-CM | POA: Diagnosis not present

## 2022-04-23 DIAGNOSIS — E1165 Type 2 diabetes mellitus with hyperglycemia: Secondary | ICD-10-CM | POA: Diagnosis not present

## 2022-04-29 ENCOUNTER — Other Ambulatory Visit: Payer: Self-pay | Admitting: "Endocrinology

## 2022-04-29 DIAGNOSIS — M25552 Pain in left hip: Secondary | ICD-10-CM | POA: Diagnosis not present

## 2022-04-29 DIAGNOSIS — M546 Pain in thoracic spine: Secondary | ICD-10-CM | POA: Diagnosis not present

## 2022-04-29 DIAGNOSIS — M797 Fibromyalgia: Secondary | ICD-10-CM | POA: Diagnosis not present

## 2022-04-29 DIAGNOSIS — M2550 Pain in unspecified joint: Secondary | ICD-10-CM | POA: Diagnosis not present

## 2022-04-29 DIAGNOSIS — M5440 Lumbago with sciatica, unspecified side: Secondary | ICD-10-CM | POA: Diagnosis not present

## 2022-04-29 DIAGNOSIS — R109 Unspecified abdominal pain: Secondary | ICD-10-CM | POA: Diagnosis not present

## 2022-04-29 DIAGNOSIS — M179 Osteoarthritis of knee, unspecified: Secondary | ICD-10-CM | POA: Diagnosis not present

## 2022-04-29 DIAGNOSIS — Z79891 Long term (current) use of opiate analgesic: Secondary | ICD-10-CM | POA: Diagnosis not present

## 2022-04-29 DIAGNOSIS — K858 Other acute pancreatitis without necrosis or infection: Secondary | ICD-10-CM | POA: Diagnosis not present

## 2022-04-29 DIAGNOSIS — G894 Chronic pain syndrome: Secondary | ICD-10-CM | POA: Diagnosis not present

## 2022-04-29 DIAGNOSIS — K589 Irritable bowel syndrome without diarrhea: Secondary | ICD-10-CM | POA: Diagnosis not present

## 2022-04-29 DIAGNOSIS — M542 Cervicalgia: Secondary | ICD-10-CM | POA: Diagnosis not present

## 2022-04-30 DIAGNOSIS — E1165 Type 2 diabetes mellitus with hyperglycemia: Secondary | ICD-10-CM | POA: Diagnosis not present

## 2022-04-30 DIAGNOSIS — N390 Urinary tract infection, site not specified: Secondary | ICD-10-CM | POA: Diagnosis not present

## 2022-04-30 DIAGNOSIS — E1143 Type 2 diabetes mellitus with diabetic autonomic (poly)neuropathy: Secondary | ICD-10-CM | POA: Diagnosis not present

## 2022-04-30 DIAGNOSIS — K3184 Gastroparesis: Secondary | ICD-10-CM | POA: Diagnosis not present

## 2022-04-30 DIAGNOSIS — I110 Hypertensive heart disease with heart failure: Secondary | ICD-10-CM | POA: Diagnosis not present

## 2022-04-30 DIAGNOSIS — I5032 Chronic diastolic (congestive) heart failure: Secondary | ICD-10-CM | POA: Diagnosis not present

## 2022-05-06 DIAGNOSIS — E1143 Type 2 diabetes mellitus with diabetic autonomic (poly)neuropathy: Secondary | ICD-10-CM | POA: Diagnosis not present

## 2022-05-06 DIAGNOSIS — I110 Hypertensive heart disease with heart failure: Secondary | ICD-10-CM | POA: Diagnosis not present

## 2022-05-06 DIAGNOSIS — I5032 Chronic diastolic (congestive) heart failure: Secondary | ICD-10-CM | POA: Diagnosis not present

## 2022-05-06 DIAGNOSIS — E1165 Type 2 diabetes mellitus with hyperglycemia: Secondary | ICD-10-CM | POA: Diagnosis not present

## 2022-05-08 ENCOUNTER — Other Ambulatory Visit: Payer: Self-pay

## 2022-05-08 ENCOUNTER — Emergency Department (HOSPITAL_COMMUNITY): Payer: 59

## 2022-05-08 ENCOUNTER — Encounter (HOSPITAL_COMMUNITY): Payer: Self-pay | Admitting: Emergency Medicine

## 2022-05-08 ENCOUNTER — Inpatient Hospital Stay (HOSPITAL_COMMUNITY)
Admission: EM | Admit: 2022-05-08 | Discharge: 2022-05-12 | DRG: 069 | Disposition: A | Discharge status: Skilled Nursing Facility | Payer: 59 | Attending: Family Medicine | Admitting: Family Medicine

## 2022-05-08 DIAGNOSIS — R29810 Facial weakness: Secondary | ICD-10-CM | POA: Diagnosis present

## 2022-05-08 DIAGNOSIS — N390 Urinary tract infection, site not specified: Secondary | ICD-10-CM | POA: Diagnosis present

## 2022-05-08 DIAGNOSIS — J449 Chronic obstructive pulmonary disease, unspecified: Secondary | ICD-10-CM | POA: Diagnosis present

## 2022-05-08 DIAGNOSIS — I5032 Chronic diastolic (congestive) heart failure: Secondary | ICD-10-CM | POA: Diagnosis present

## 2022-05-08 DIAGNOSIS — Z791 Long term (current) use of non-steroidal anti-inflammatories (NSAID): Secondary | ICD-10-CM

## 2022-05-08 DIAGNOSIS — Z86718 Personal history of other venous thrombosis and embolism: Secondary | ICD-10-CM | POA: Diagnosis not present

## 2022-05-08 DIAGNOSIS — E1142 Type 2 diabetes mellitus with diabetic polyneuropathy: Secondary | ICD-10-CM | POA: Diagnosis present

## 2022-05-08 DIAGNOSIS — I4891 Unspecified atrial fibrillation: Secondary | ICD-10-CM | POA: Diagnosis not present

## 2022-05-08 DIAGNOSIS — I25119 Atherosclerotic heart disease of native coronary artery with unspecified angina pectoris: Secondary | ICD-10-CM | POA: Diagnosis not present

## 2022-05-08 DIAGNOSIS — E1159 Type 2 diabetes mellitus with other circulatory complications: Secondary | ICD-10-CM

## 2022-05-08 DIAGNOSIS — I10 Essential (primary) hypertension: Secondary | ICD-10-CM | POA: Diagnosis not present

## 2022-05-08 DIAGNOSIS — K219 Gastro-esophageal reflux disease without esophagitis: Secondary | ICD-10-CM | POA: Diagnosis present

## 2022-05-08 DIAGNOSIS — Z79899 Other long term (current) drug therapy: Secondary | ICD-10-CM

## 2022-05-08 DIAGNOSIS — G4733 Obstructive sleep apnea (adult) (pediatric): Secondary | ICD-10-CM | POA: Diagnosis not present

## 2022-05-08 DIAGNOSIS — F32A Depression, unspecified: Secondary | ICD-10-CM | POA: Diagnosis not present

## 2022-05-08 DIAGNOSIS — E7800 Pure hypercholesterolemia, unspecified: Secondary | ICD-10-CM | POA: Diagnosis not present

## 2022-05-08 DIAGNOSIS — E1165 Type 2 diabetes mellitus with hyperglycemia: Secondary | ICD-10-CM | POA: Diagnosis present

## 2022-05-08 DIAGNOSIS — K5900 Constipation, unspecified: Secondary | ICD-10-CM | POA: Diagnosis not present

## 2022-05-08 DIAGNOSIS — I672 Cerebral atherosclerosis: Secondary | ICD-10-CM | POA: Diagnosis present

## 2022-05-08 DIAGNOSIS — G459 Transient cerebral ischemic attack, unspecified: Secondary | ICD-10-CM | POA: Diagnosis not present

## 2022-05-08 DIAGNOSIS — I2510 Atherosclerotic heart disease of native coronary artery without angina pectoris: Secondary | ICD-10-CM | POA: Diagnosis present

## 2022-05-08 DIAGNOSIS — Z9181 History of falling: Secondary | ICD-10-CM

## 2022-05-08 DIAGNOSIS — Z823 Family history of stroke: Secondary | ICD-10-CM

## 2022-05-08 DIAGNOSIS — Z6841 Body Mass Index (BMI) 40.0 and over, adult: Secondary | ICD-10-CM

## 2022-05-08 DIAGNOSIS — Z794 Long term (current) use of insulin: Secondary | ICD-10-CM | POA: Diagnosis not present

## 2022-05-08 DIAGNOSIS — I6621 Occlusion and stenosis of right posterior cerebral artery: Secondary | ICD-10-CM | POA: Diagnosis not present

## 2022-05-08 DIAGNOSIS — Z955 Presence of coronary angioplasty implant and graft: Secondary | ICD-10-CM

## 2022-05-08 DIAGNOSIS — Z7984 Long term (current) use of oral hypoglycemic drugs: Secondary | ICD-10-CM

## 2022-05-08 DIAGNOSIS — Z888 Allergy status to other drugs, medicaments and biological substances status: Secondary | ICD-10-CM

## 2022-05-08 DIAGNOSIS — Z8249 Family history of ischemic heart disease and other diseases of the circulatory system: Secondary | ICD-10-CM

## 2022-05-08 DIAGNOSIS — Z1401 Asymptomatic hemophilia A carrier: Secondary | ICD-10-CM

## 2022-05-08 DIAGNOSIS — I6389 Other cerebral infarction: Secondary | ICD-10-CM | POA: Diagnosis not present

## 2022-05-08 DIAGNOSIS — I252 Old myocardial infarction: Secondary | ICD-10-CM

## 2022-05-08 DIAGNOSIS — Z79890 Hormone replacement therapy (postmenopausal): Secondary | ICD-10-CM

## 2022-05-08 DIAGNOSIS — B9620 Unspecified Escherichia coli [E. coli] as the cause of diseases classified elsewhere: Secondary | ICD-10-CM | POA: Diagnosis present

## 2022-05-08 DIAGNOSIS — R531 Weakness: Secondary | ICD-10-CM | POA: Diagnosis present

## 2022-05-08 DIAGNOSIS — R0902 Hypoxemia: Secondary | ICD-10-CM | POA: Diagnosis not present

## 2022-05-08 DIAGNOSIS — Z90710 Acquired absence of both cervix and uterus: Secondary | ICD-10-CM

## 2022-05-08 DIAGNOSIS — F419 Anxiety disorder, unspecified: Secondary | ICD-10-CM | POA: Diagnosis present

## 2022-05-08 DIAGNOSIS — Z9049 Acquired absence of other specified parts of digestive tract: Secondary | ICD-10-CM

## 2022-05-08 DIAGNOSIS — Z7982 Long term (current) use of aspirin: Secondary | ICD-10-CM

## 2022-05-08 DIAGNOSIS — I639 Cerebral infarction, unspecified: Secondary | ICD-10-CM | POA: Diagnosis not present

## 2022-05-08 DIAGNOSIS — Z743 Need for continuous supervision: Secondary | ICD-10-CM | POA: Diagnosis not present

## 2022-05-08 DIAGNOSIS — R6889 Other general symptoms and signs: Secondary | ICD-10-CM | POA: Diagnosis not present

## 2022-05-08 DIAGNOSIS — I110 Hypertensive heart disease with heart failure: Secondary | ICD-10-CM | POA: Diagnosis not present

## 2022-05-08 DIAGNOSIS — Z8619 Personal history of other infectious and parasitic diseases: Secondary | ICD-10-CM

## 2022-05-08 DIAGNOSIS — Z832 Family history of diseases of the blood and blood-forming organs and certain disorders involving the immune mechanism: Secondary | ICD-10-CM

## 2022-05-08 DIAGNOSIS — M1990 Unspecified osteoarthritis, unspecified site: Secondary | ICD-10-CM | POA: Diagnosis present

## 2022-05-08 DIAGNOSIS — K760 Fatty (change of) liver, not elsewhere classified: Secondary | ICD-10-CM | POA: Diagnosis not present

## 2022-05-08 DIAGNOSIS — I6603 Occlusion and stenosis of bilateral middle cerebral arteries: Secondary | ICD-10-CM | POA: Diagnosis not present

## 2022-05-08 DIAGNOSIS — G319 Degenerative disease of nervous system, unspecified: Secondary | ICD-10-CM | POA: Diagnosis not present

## 2022-05-08 DIAGNOSIS — K5730 Diverticulosis of large intestine without perforation or abscess without bleeding: Secondary | ICD-10-CM | POA: Diagnosis not present

## 2022-05-08 DIAGNOSIS — R4781 Slurred speech: Secondary | ICD-10-CM | POA: Diagnosis not present

## 2022-05-08 LAB — APTT: aPTT: 30 seconds (ref 24–36)

## 2022-05-08 LAB — ETHANOL: Alcohol, Ethyl (B): <10 mg/dL (ref <10)

## 2022-05-08 LAB — COMPREHENSIVE METABOLIC PANEL
ALT: 14 U/L (ref 0–44)
AST: 21 U/L (ref 15–41)
Albumin: 2.9 g/dL — ABNORMAL LOW (ref 3.5–5.0)
Alkaline Phosphatase: 62 U/L (ref 38–126)
Anion gap: 10 (ref 5–15)
BUN: 15 mg/dL (ref 8–23)
CO2: 30 mmol/L (ref 22–32)
Calcium: 8.3 mg/dL — ABNORMAL LOW (ref 8.9–10.3)
Chloride: 98 mmol/L (ref 98–111)
Creatinine, Ser: 0.69 mg/dL (ref 0.44–1.00)
GFR, Estimated: >60 mL/min (ref >60)
Glucose, Bld: 201 mg/dL — ABNORMAL HIGH (ref 70–99)
Potassium: 3.9 mmol/L (ref 3.5–5.1)
Sodium: 138 mmol/L (ref 135–145)
Total Bilirubin: 0.7 mg/dL (ref 0.3–1.2)
Total Protein: 6 g/dL — ABNORMAL LOW (ref 6.5–8.1)

## 2022-05-08 LAB — DIFFERENTIAL
Abs Immature Granulocytes: 0.03 10*3/uL (ref 0.00–0.07)
Basophils Absolute: 0 10*3/uL (ref 0.0–0.1)
Basophils Relative: 0 %
Eosinophils Absolute: 0.1 10*3/uL (ref 0.0–0.5)
Eosinophils Relative: 2 %
Immature Granulocytes: 0 %
Lymphocytes Relative: 24 %
Lymphs Abs: 2.1 10*3/uL (ref 0.7–4.0)
Monocytes Absolute: 0.7 10*3/uL (ref 0.1–1.0)
Monocytes Relative: 8 %
Neutro Abs: 5.6 10*3/uL (ref 1.7–7.7)
Neutrophils Relative %: 66 %

## 2022-05-08 LAB — PROTIME-INR
INR: 0.9 (ref 0.8–1.2)
Prothrombin Time: 12.4 seconds (ref 11.4–15.2)

## 2022-05-08 LAB — CBC
HCT: 36.7 % (ref 36.0–46.0)
Hemoglobin: 11.4 g/dL — ABNORMAL LOW (ref 12.0–15.0)
MCH: 26.3 pg (ref 26.0–34.0)
MCHC: 31.1 g/dL (ref 30.0–36.0)
MCV: 84.6 fL (ref 80.0–100.0)
Platelets: 348 10*3/uL (ref 150–400)
RBC: 4.34 MIL/uL (ref 3.87–5.11)
RDW: 16.2 % — ABNORMAL HIGH (ref 11.5–15.5)
WBC: 8.6 10*3/uL (ref 4.0–10.5)
nRBC: 0 % (ref 0.0–0.2)

## 2022-05-08 LAB — GLUCOSE, CAPILLARY: Glucose-Capillary: 79 mg/dL (ref 70–99)

## 2022-05-08 LAB — TROPONIN I (HIGH SENSITIVITY)
Troponin I (High Sensitivity): 8 ng/L (ref <18)
Troponin I (High Sensitivity): 7 ng/L (ref <18)

## 2022-05-08 MED ORDER — CLOPIDOGREL BISULFATE 75 MG PO TABS
300.0000 mg | ORAL_TABLET | Freq: Once | ORAL | Status: AC
  Administered 2022-05-08: 300 mg via ORAL
  Filled 2022-05-08: qty 4

## 2022-05-08 MED ORDER — ATORVASTATIN CALCIUM 40 MG PO TABS
80.0000 mg | ORAL_TABLET | Freq: Every day | ORAL | Status: DC
  Administered 2022-05-09 – 2022-05-12 (×4): 80 mg via ORAL
  Filled 2022-05-08 (×4): qty 2

## 2022-05-08 MED ORDER — BUPROPION HCL ER (SR) 100 MG PO TB12
100.0000 mg | ORAL_TABLET | Freq: Every day | ORAL | Status: DC
  Administered 2022-05-09 – 2022-05-12 (×4): 100 mg via ORAL
  Filled 2022-05-08 (×4): qty 1

## 2022-05-08 MED ORDER — ISOSORBIDE MONONITRATE ER 30 MG PO TB24: 15.0000 mg | ORAL_TABLET | Freq: Every day | ORAL | Status: DC

## 2022-05-08 MED ORDER — ALPRAZOLAM 0.5 MG PO TABS
0.5000 mg | ORAL_TABLET | Freq: Two times a day (BID) | ORAL | Status: DC | PRN
  Administered 2022-05-08 – 2022-05-12 (×7): 0.5 mg via ORAL
  Filled 2022-05-08 (×7): qty 1

## 2022-05-08 MED ORDER — FUROSEMIDE 40 MG PO TABS
60.0000 mg | ORAL_TABLET | Freq: Every day | ORAL | Status: DC
  Administered 2022-05-09 – 2022-05-12 (×4): 60 mg via ORAL
  Filled 2022-05-08 (×4): qty 2

## 2022-05-08 MED ORDER — INSULIN LISPRO PROT & LISPRO (75-25 MIX) 100 UNIT/ML KWIKPEN: 30.0000 [IU] | PEN_INJECTOR | Freq: Two times a day (BID) | SUBCUTANEOUS | Status: DC

## 2022-05-08 MED ORDER — GABAPENTIN 300 MG PO CAPS
600.0000 mg | ORAL_CAPSULE | Freq: Three times a day (TID) | ORAL | Status: DC
  Administered 2022-05-08 – 2022-05-12 (×11): 600 mg via ORAL
  Filled 2022-05-08 (×11): qty 2

## 2022-05-08 MED ORDER — ACETAMINOPHEN 325 MG PO TABS
650.0000 mg | ORAL_TABLET | ORAL | Status: DC | PRN
  Administered 2022-05-08 – 2022-05-12 (×8): 650 mg via ORAL
  Filled 2022-05-08 (×8): qty 2

## 2022-05-08 MED ORDER — INSULIN ASPART 100 UNIT/ML IJ SOLN
0.0000 [IU] | Freq: Three times a day (TID) | INTRAMUSCULAR | Status: DC
  Administered 2022-05-09 (×2): 5 [IU] via SUBCUTANEOUS
  Administered 2022-05-09: 8 [IU] via SUBCUTANEOUS
  Administered 2022-05-10: 5 [IU] via SUBCUTANEOUS
  Administered 2022-05-10 – 2022-05-11 (×3): 8 [IU] via SUBCUTANEOUS
  Administered 2022-05-11: 5 [IU] via SUBCUTANEOUS
  Administered 2022-05-11: 15 [IU] via SUBCUTANEOUS
  Administered 2022-05-12: 8 [IU] via SUBCUTANEOUS
  Administered 2022-05-12: 11 [IU] via SUBCUTANEOUS

## 2022-05-08 MED ORDER — ACETAMINOPHEN 160 MG/5ML PO SOLN: 650.0000 mg | ORAL | Status: DC | PRN

## 2022-05-08 MED ORDER — INSULIN ASPART 100 UNIT/ML IJ SOLN
0.0000 [IU] | Freq: Every day | INTRAMUSCULAR | Status: DC
  Administered 2022-05-10: 3 [IU] via SUBCUTANEOUS

## 2022-05-08 MED ORDER — ASPIRIN 81 MG PO TBEC
81.0000 mg | DELAYED_RELEASE_TABLET | Freq: Every day | ORAL | Status: DC
  Administered 2022-05-09 – 2022-05-12 (×4): 81 mg via ORAL
  Filled 2022-05-08 (×4): qty 1

## 2022-05-08 MED ORDER — CLOPIDOGREL BISULFATE 75 MG PO TABS
75.0000 mg | ORAL_TABLET | Freq: Every day | ORAL | Status: DC
  Administered 2022-05-09 – 2022-05-12 (×4): 75 mg via ORAL
  Filled 2022-05-08 (×4): qty 1

## 2022-05-08 MED ORDER — ACETAMINOPHEN 650 MG RE SUPP: 650.0000 mg | RECTAL | Status: DC | PRN

## 2022-05-08 MED ORDER — STROKE: EARLY STAGES OF RECOVERY BOOK: Freq: Once | Status: AC

## 2022-05-08 MED ORDER — INSULIN ASPART PROT & ASPART (70-30 MIX) 100 UNIT/ML ~~LOC~~ SUSP
30.0000 [IU] | Freq: Two times a day (BID) | SUBCUTANEOUS | Status: DC
  Administered 2022-05-09 – 2022-05-11 (×6): 30 [IU] via SUBCUTANEOUS
  Filled 2022-05-08: qty 10

## 2022-05-08 MED ORDER — ENOXAPARIN SODIUM 40 MG/0.4ML IJ SOSY
40.0000 mg | PREFILLED_SYRINGE | INTRAMUSCULAR | Status: DC
  Administered 2022-05-08 – 2022-05-11 (×4): 40 mg via SUBCUTANEOUS
  Filled 2022-05-08 (×4): qty 0.4

## 2022-05-08 MED ORDER — SENNOSIDES-DOCUSATE SODIUM 8.6-50 MG PO TABS: 1.0000 | ORAL_TABLET | Freq: Every evening | ORAL | Status: DC | PRN

## 2022-05-08 MED ORDER — PANTOPRAZOLE SODIUM 40 MG PO TBEC
40.0000 mg | DELAYED_RELEASE_TABLET | Freq: Two times a day (BID) | ORAL | Status: DC
  Administered 2022-05-09 – 2022-05-12 (×7): 40 mg via ORAL
  Filled 2022-05-08 (×7): qty 1

## 2022-05-08 MED ORDER — IOHEXOL 350 MG/ML SOLN
75.0000 mL | Freq: Once | INTRAVENOUS | Status: AC | PRN
  Administered 2022-05-08: 75 mL via INTRAVENOUS

## 2022-05-08 MED ORDER — DOXEPIN HCL 25 MG PO CAPS
75.0000 mg | ORAL_CAPSULE | Freq: Every day | ORAL | Status: DC
  Administered 2022-05-08 – 2022-05-11 (×4): 75 mg via ORAL
  Filled 2022-05-08 (×4): qty 3

## 2022-05-08 NOTE — Assessment & Plan Note (Signed)
Allow for permissive hypertension -Hold imdur, metoprolol, lisinopril

## 2022-05-08 NOTE — Consult Note (Signed)
[facility] Telemedicine Consult   Requesting Provider: Dr. Rogene Houston Consult Participants: Dr. Jerelyn Charles, Telespecialist RN Laqueta Jean   Bedside RN [place] Location of the provider: [facility]) Location of the patient: [facility] 05  This consult was provided via telemedicine with 2-way video and audio communication. The patient/family was informed that care would be provided in this way and agreed to receive care in this manner.   Chief Complaint: left sided wekaness and slurred speech  HPI: 72 year old woman with a complex medical history that includes coronary artery disease, CHF, COPD, diabetes, obesity, sleep apnea, prior history of MI status post PCI with stent placement, anxiety, depression, presenting via [facility] with last known well at 2:50 PM for left arm numbness and weakness along with slurred speech.  Blood pressures were 220/140, heart rate 93, CBG 187. She was evaluated by the EDP and brought in for a CT scan. I received a code stroke page at 1557 hrs.  I was on the camera at 1559 hrs. On my evaluation, she had weakness in all 4 limbs, mild slurred speech and subjective heaviness of the left side. A lot of the weakness based on observation on camera was effort dependent and due to known focality, I did not deem her to be a TNKase candidate. Stat vessel imaging was ordered to rule out emergent LVO although clinical exam was not very consistent with the LVO but given her risk factors, I do not want to miss an emergent occlusion.   On review of systems, she also had mild chest discomfort and headache when all the symptoms started as well.  Son came by later and reports that she also had some left-sided facial droop which now appears to have resolved   Review of cardiology notes-question of atrial fibrillation at [facility], was started on Eliquis but then stopped taking it.  Now not on Eliquis but cannot confirm because she gets her medications in a  prepackaged box from the pharmacy but the notes from cardiology in our system mention that she is not on Eliquis.   Past Medical History:  Diagnosis Date   Anxiety and depression    Arthritis    CAD (coronary artery disease)    Stent placement circumflex coronary 2007, catheterization 2008 patent stents. 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  Current Outpatient Medications:    ACCU-CHEK GUIDE test strip, USE AS DIRECTED, Disp: 150 strip, Rfl: 2   ALPRAZolam (XANAX) 0.5 MG tablet, Take 0.5 mg by mouth 2 (two) times daily as needed for anxiety or sleep., Disp: , Rfl:    Armodafinil 150 MG tablet, Take 150 mg by mouth daily., Disp: , Rfl:  aspirin EC 81 MG tablet, Take 1 tablet (81 mg total) by mouth daily., Disp: , Rfl:    atorvastatin (LIPITOR) 40 MG tablet, Take 40 mg by mouth daily., Disp: , Rfl:    B-D ULTRAFINE III SHORT PEN 31G X 8 MM MISC, Inject into the skin 4 (four) times daily., Disp: , Rfl:    Blood Glucose Monitoring Suppl (ACCU-CHEK GUIDE ME) w/Device KIT, 1 Piece by Does not apply route as directed., Disp: 1 kit, Rfl: 0   buPROPion (WELLBUTRIN SR) 100 MG 12 hr tablet, Take 100 mg by mouth daily., Disp: , Rfl:    Cholecalciferol (VITAMIN D3) 125 MCG (5000 UT) CAPS, TAKE ONE CAPSULE BY MOUTH DAILY (Patient taking differently: Take 5,000 Units by  mouth daily.), Disp: 90 capsule, Rfl: 0   diclofenac Sodium (VOLTAREN) 1 % GEL, Apply topically. (Patient not taking: Reported on 10/17/2021), Disp: , Rfl:    diltiazem (CARDIZEM CD) 240 MG 24 hr capsule, Take 240 mg by mouth daily., Disp: , Rfl:    doxepin (SINEQUAN) 25 MG capsule, Take 75 mg by mouth at bedtime. , Disp: , Rfl:    estradiol (ESTRACE) 0.5 MG tablet, Take 0.5 mg by mouth daily. , Disp: , Rfl:    ferrous sulfate 325 (65 FE) MG EC tablet, Take by mouth., Disp: , Rfl:    furosemide (LASIX) 40 MG tablet, Take 1.5 tablets (60 mg total) by mouth daily., Disp: 135 tablet, Rfl: 3   gabapentin (NEURONTIN) 400 MG capsule, Take 600 mg by mouth 3 (three) times daily., Disp: , Rfl: 5   glipiZIDE (GLUCOTROL XL) 5 MG 24 hr tablet, Take 5 mg by mouth daily., Disp: , Rfl:    Insulin Lispro Prot & Lispro (HUMALOG 75/25 MIX) (75-25) 100 UNIT/ML Kwikpen, Inject 80 Units into the skin 2 (two) times daily with a meal., Disp: 135 mL, Rfl: 0   isosorbide mononitrate (IMDUR) 30 MG 24 hr tablet, TAKE 1/2 TABLET BY MOUTH DAILY (Need TO make APPOINTMENT FOR NEXT refills), Disp: 3 tablet, Rfl: 0   lisinopril (ZESTRIL) 5 MG tablet, TAKE 1 TABLET BY MOUTH DAILY (Make APPOINTMENT FOR NEXT refills), Disp: 7 tablet, Rfl: 0   meloxicam (MOBIC) 7.5 MG tablet, Take by mouth., Disp: , Rfl:    metFORMIN (GLUCOPHAGE) 1000 MG tablet, Take 1,000 mg by mouth 2 (two) times daily., Disp: , Rfl:    metFORMIN (GLUCOPHAGE) 500 MG tablet, TAKE 1 TABLET BY MOUTH TWICE DAILY (Patient taking differently: Take 1,000 mg by mouth 2 (two) times daily with a meal.), Disp: 60 tablet, Rfl: 3   methocarbamol (ROBAXIN) 500 MG tablet, Take 500 mg by mouth 2 (two) times daily., Disp: , Rfl:    metoCLOPramide (REGLAN) 5 MG tablet, Take 5 mg by mouth 3 (three) times daily as needed for nausea or vomiting. , Disp: , Rfl: 1   metoprolol succinate (TOPROL-XL) 100 MG 24 hr tablet, Take 100 mg by mouth daily. Take with or immediately following a meal.,  Disp: , Rfl:    montelukast (SINGULAIR) 10 MG tablet, Take 10 mg by mouth at bedtime. , Disp: , Rfl:    nitroGLYCERIN (NITROSTAT) 0.4 MG SL tablet, Place 1 tablet (0.4 mg total) under the tongue every 5 (five) minutes as needed for chest pain (up to 3 doses. If taking 3rd dose call 911)., Disp: 25 tablet, Rfl: 3   ondansetron (ZOFRAN) 4 MG tablet, Take 4 mg by mouth every other day., Disp: , Rfl:    oxyCODONE-acetaminophen (PERCOCET) 7.5-325  MG tablet, Take 1 tablet by mouth every 6 (six) hours as needed., Disp: , Rfl:    pantoprazole (PROTONIX) 40 MG tablet, TAKE 1 TABLET BY MOUTH TWICE DAILY BEFORE MEALS (Patient taking differently: Take 40 mg by mouth 2 (two) times daily before a meal.), Disp: 60 tablet, Rfl: 1   polyethylene glycol (MIRALAX / GLYCOLAX) 17 g packet, Take 17 g by mouth daily as needed for moderate constipation., Disp: , Rfl:    potassium chloride (KLOR-CON) 10 MEQ tablet, Take 10 mEq by mouth 2 (two) times daily., Disp: , Rfl:    PROAIR HFA 108 (90 Base) MCG/ACT inhaler, Inhale 2 puffs into the lungs every 4 (four) hours as needed for wheezing or shortness of breath. , Disp: , Rfl: 8   promethazine (PHENERGAN) 25 MG tablet, Take 1 tablet (25 mg total) by mouth 4 (four) times daily as needed. (Patient taking differently: Take 25 mg by mouth 4 (four) times daily as needed for nausea or vomiting.), Disp: 30 tablet, Rfl: 0   sucralfate (CARAFATE) 1 g tablet, , Disp: , Rfl:    SURE COMFORT INS SYR 1CC/30G 30G X 5/16" 1 ML MISC, 1 each by Other route daily., Disp: , Rfl:    topiramate (TOPAMAX) 200 MG tablet, Take 200 mg by mouth at bedtime., Disp: , Rfl: 3    LKW: 2:50 PM tpa given?: No, nonfocal exam IR Thrombectomy? No, no LVO Modified Rankin Scale: 2-Slight disability-UNABLE to perform all activities but does not need assistance Time of teleneurologist evaluation: 1559 hrs  Exam: Vitals:   05/08/22 1555  BP: (!) 179/95  Pulse: 93  Resp: 19  SpO2: 97%    General: Awake  alert Neurological exam Awake alert oriented x 3 Mildly dysarthric No aphasia Cranial nerves: Pupils equal round react light, extraocular movements intact, visual fields full, face appears symmetric, tongue and palate midline. Motor examination with effort dependent looking weakness/generalized weakness with drift in all 4 extremities which looks pretty symmetric. Sensation: Reports subjective sensation of heaviness on the left arm, otherwise intact. Coordination difficult to assess given her overall weakness.   NIHSS 1A: Level of Consciousness - 0 1B: Ask Month and Age - 0 1C: 'Blink Eyes' & 'Squeeze Hands' - 0 2: Test Horizontal Extraocular Movements - 0 3: Test Visual Fields - 0 4: Test Facial Palsy - 0 5A: Test Left Arm Motor Drift - 2 5B: Test Right Arm Motor Drift - 2 6A: Test Left Leg Motor Drift - 2 6B: Test Right Leg Motor Drift - 2 7: Test Limb Ataxia - 0 8: Test Sensation - 1 9: Test Language/Aphasia- 0 10: Test Dysarthria - 1 11: Test Extinction/Inattention - 0 NIHSS score: 10   Imaging Reviewed: CT head with chronic changes as seen before-unchanged from prior CT head with bilateral thalamic capsular remote infarcts.  No acute changes. CT angio head and neck with multiple areas of intracranial atherosclerotic disease in bilateral anterior and posterior circulation.  Formal read pending.  Labs reviewed in epic and pertinent values follow: CBC    Component Value Date/Time   WBC 11.4 (H) 08/08/2020 0244   RBC 4.82 08/08/2020 0244   HGB 10.6 (L) 08/08/2020 0244   HCT 35.4 (L) 08/08/2020 0244   PLT 382 08/08/2020 0244   MCV 73.4 (L) 08/08/2020 0244   MCH 22.0 (L) 08/08/2020 0244   MCHC 29.9 (L) 08/08/2020 0244   RDW 16.1 (H) 08/08/2020 0244   LYMPHSABS 2.8 02/17/2017 1409   MONOABS 0.5 02/17/2017 1409  EOSABS 0.1 02/17/2017 1409   BASOSABS 0.0 02/17/2017 1409   CMP     Component Value Date/Time   NA 140 01/01/2022 1357   K 4.7 01/01/2022 1357   CL 104  01/01/2022 1357   CO2 26 01/01/2022 1357   GLUCOSE 70 01/01/2022 1357   GLUCOSE 233 (H) 08/08/2020 0244   BUN 17 01/01/2022 1357   CREATININE 0.71 01/01/2022 1357   CREATININE 0.70 11/02/2018 1105   CALCIUM 8.6 (L) 01/01/2022 1357   PROT 5.6 (L) 01/01/2022 1357   ALBUMIN 3.5 (L) 01/01/2022 1357   AST 11 01/01/2022 1357   ALT 11 01/01/2022 1357   ALKPHOS 81 01/01/2022 1357   BILITOT 0.3 01/01/2022 1357   GFRNONAA >60 08/08/2020 0244   GFRNONAA 91 11/02/2018 1105   GFRAA >60 10/27/2019 1130   GFRAA 105 11/02/2018 1105   Assessment: 72 year old woman with an extensive cerebrovascular risk factor history presenting for evaluation of sudden onset of slurred speech and left-sided numbness along with some mild chest pain at start. Code stroke was activated due to sudden onset of focal neurological deficits. On my examination she has equal drift in all 4 extremities with subjective left arm sensory changes without any other objective findings.  I do not really appreciate a good facial asymmetry on the camera. At this time, I do not think she is a candidate for TNKase due to nonfocal exam. Stat CT angio head and neck with no emergent LVO.  Scattered intracranial atherosclerosis seen. Given the fact that she did have a localized left-sided symptoms, I would recommend admission for TIA workup.  Recommendations:  Admit to hospitalist Frequent neurochecks MRI brain without contrast 2D echo She is on aspirin at home I would recommend aspirin 81+ Plavix 75-for 3 months followed by aspirin only. I would also recommend high intensity statin-atorvastatin 80. Check A1c Check LDL Permissive hypertension-allow for blood pressures to be as high as 220 and treat only if higher than that on a as needed basis. After about 2 to 3 days, start normalizing blood pressure for an eventual goal of normotension. PT OT Speech therapy N.p.o. until clears stroke swallow screen May need consideration for repeat  prolonged cardiac monitoring-can discuss this with cardiology outpatient.  Remote strokes look more small vessel etiology and not cardioembolic though.  Plan was discussed with patient and son at bedside Plan was relayed to Dr. Rogene Houston.  Please contact the on-call neurologist for routine consultations in the morning for a follow-up.  -- Amie Portland, MD Neurologist [facility] Pager: [phone]

## 2022-05-08 NOTE — Assessment & Plan Note (Addendum)
Reports slurred speech, numbness next to left upper and lower extremity.  Reports her speech is not quite back to baseline, on my evaluation speech is clear is clear and fluent without evidence of aphasia, both lower extremities are weak, and she has good and equal strength to bilateral upper extremities.  Head CT, CTA head and neck-unremarkable, negative for large vessel occlusion, shows moderate to severe stenosis of M2 MCA, P2 PCA, M1 MCA. - MRI both negative for immediately motion degraded, but no evidence of acute infarct, or acute cranial abnormality. -Code stroke called, patient not deemed to be TPA candidate, nonfocal exam. -Teleneurology recommends aspirin and Plavix, Atorvastatin 80 mg daily, allow for permissive hypertension. - ECHO

## 2022-05-08 NOTE — ED Notes (Signed)
To room to administer medications and update VS.  Pt has gotten herself up out of bed and is sitting in chair at bedside.  Pt has complete control of both arms and speech is clear.

## 2022-05-08 NOTE — ED Notes (Signed)
ED TO INPATIENT HANDOFF REPORT  ED Nurse Name and Phone #: Clent Ridges Name/Age/Gender [name] 72 y.o. female Room/Bed: APA05/APA05  Code Status   Code Status: Prior  Home/SNF/Other Home Patient oriented to: self, place, time, and situation Is this baseline? Yes   Triage Complete: Triage complete  Chief Complaint TIA (transient ischemic attack) [G45.9]  Triage Note Pt brought in by [facility] emergency traffic with code stroke called in the field. [id number] and witnessed by her husband. EMS reports pt has left arm numbness and weakness along with slurred speech. BP 220/140, HR 93, CBG 187 for EMS.   Dr. Roderic Palau at EMS bay doors to assess pt upon arrival.    Allergies Allergies  Allergen Reactions   Amphetamine-Dextroamphetamine Swelling   Nitrofuran Derivatives Itching and Swelling   Amphetamine-Dextroamphet Er Swelling   Pregabalin Swelling   Topiramate Other (See Comments)    Tongue tingle   Verelan [Verapamil] Rash    Level of Care/Admitting Diagnosis ED Disposition     ED Disposition  Admit   Condition  --   [place]: [facility] [id number]  Level of Care: Telemetry [5]  Covid Evaluation: Asymptomatic - no recent exposure (last 10 days) testing not required  Diagnosis: TIA (transient ischemic attack) [id number]  Admitting Physician: Bethena Roys [id number]  Attending Physician: Kara Pacer          B Medical/Surgery History Past Medical History:  Diagnosis Date   Anxiety and depression    Arthritis    CAD (coronary artery disease)    Stent placement circumflex coronary 2007, catheterization 2008 patent stents. Normal LV function   Chest pain    CHF (congestive heart failure) (HCC)    COPD (chronic obstructive pulmonary disease) (HCC)    no home O2   Depression    Diabetes mellitus    Insulin dependent   Diabetic polyneuropathy (HCC)     severe on multiple medications   Dyslipidemia    GERD  (gastroesophageal reflux disease)    Headache(784.0)    Heart attack (HCC)    Heartburn    Hemophilia A carrier    High cholesterol    Hx of blood clots    Hypertension    MI (myocardial infarction) ([place])    2007   Obstructive sleep apnea    CPAP, setting ?2   Palpitations    Sleep apnea    Tachycardia    Past Surgical History:  Procedure Laterality Date   ABDOMINAL HYSTERECTOMY     Arm Surgery Bilateral    due to fall-pt broke both forearms   BIOPSY N/A 07/01/2013   Procedure: BIOPSY;  Surgeon: Rogene Houston, MD;  Location: [facility] ORS;  Service: Endoscopy;  Laterality: N/A;   CHOLECYSTECTOMY     COLONOSCOPY  08/06/2011   Procedure: COLONOSCOPY;  Surgeon: Rogene Houston, MD;  Location: [facility] SU[facility];  Service: Endoscopy;  Laterality: N/A;  215   COLONOSCOPY WITH PROPOFOL N/A 12/31/2012   Procedure: COLONOSCOPY WITH PROPOFOL;  Surgeon: Rogene Houston, MD;  Location: [facility];  Service: Endoscopy;  Laterality: N/A;  in cecum at 0814, total withdrawal time 76mn   COLONOSCOPY WITH PROPOFOL N/A 02/05/2018   Procedure: COLONOSCOPY WITH PROPOFOL;  Surgeon: RRogene Houston MD;  Location: [facility];  Service: Endoscopy;  Laterality: N/A;  1:25   CORONARY ANGIOPLASTY WITH STENT PLACEMENT     CYSTOSCOPY W/ URETERAL ST[facility] PLACEMENT [facility]  CYSTOSCOPY WITH RETROGRADE PYELOGRAM, URETEROSCOPY AND STENT PLACEMENT Right 12/22/2019   Procedure: CYSTOSCOPY WITH RIGHT URETERAL STENT REMOVAL; RIGHT RETROGRADE PYELOGRAM,RIGHT URETEROSCOPY;  Surgeon: Cleon Gustin, MD;  Location: [facility];  Service: Urology;  Laterality: Right;   ESOPHAGEAL DILATION N/A 06/07/2015   Procedure: ESOPHAGEAL DILATION;  Surgeon: Rogene Houston, MD;  Location: [facility];  Service: Endoscopy;  Laterality: N/A;   ESOPHAGOGASTRODUODENOSCOPY N/A 06/07/2015   Procedure: ESOPHAGOGASTRODUODENOSCOPY (EGD);  Surgeon: Rogene Houston, MD;  Location[facility];  Service: Endoscopy;  Laterality: N/A;   2:45 - moved to 1:00 - Ann to notify pt   ESOPHAGOGASTRODUODENOSCOPY (EGD) WITH PROPOFOL N/A 12/31/2012   Procedure: ESOPHAGOGASTRODUODENOSCOPY (EGD) WITH PROPOFOL;  Surgeon: Rogene Houston, MD;  Location: [facility];  Service: Endoscopy;  Laterality: N/A;  GE junction 37,   ESOPHAGOGASTRODUODENOSCOPY (EGD) WITH PROPOFOL N/A 07/01/2013   Procedure: ESOPHAGOGASTRODUODENOSCOPY (EGD) WITH PROPOFOL;  Surgeon: Rogene Houston, MD;  Location: [facility];  Service: Endoscopy;  Laterality: N/A;  [place] N/A 12/31/2012   Procedure: MALONEY DILATION;  Surgeon: Rogene Houston, MD;  Location: [facility];  Service: Endoscopy;  Laterality: N/A;  used a # 54,#56   MALONEY DILATION N/A 07/01/2013   Procedure: MALONEY DILATION;  Surgeon: Rogene Houston, MD;  Location: [facility];  Service: Endoscopy;  Laterality: N/A;  54/58; no heme present   POLYPECTOMY  02/05/2018   Procedure: POLYPECTOMY;  Surgeon: Rogene Houston, MD;  Location: [facility];  Service: Endoscopy;;  distal rectum (HS)   STONE EXTRACTION WITH BASKET Right 12/22/2019   Procedure: STONE EXTRACTION WITH BASKET;  Surgeon: Cleon Gustin, MD;  Location: [facility];  Service: Urology;  Laterality: Right;     A IV Location/Drains/Wounds Patient Lines/Drains/Airways Status     Active Line/Drains/Airways     Name Placement date Placement time Site Days   Peripheral IV 05/08/22 20 G Right Forearm 05/08/22  1624  Forearm  less than 1            Intake/Output Last 24 hours No intake or output data in the 24 hours ending 05/08/22 1811  Labs/Imaging Results for orders placed or performed during the hospital encounter of 05/08/22 (from the past 48 hour(s))  Ethanol     Status: None   Collection Time: 05/08/22  4:40 PM  Result Value Ref Range   Alcohol, Ethyl (B) <10 <10 mg/dL    Comment: (NOTE) Lowest detectable limit for serum alcohol is 10 mg/dL.  For medical purposes only. Performed at [facility], [address]  [place], [place] [address]   Protime-INR     Status: None   Collection Time: 05/08/22  4:40 PM  Result Value Ref Range   Prothrombin Time 12.4 11.4 - 15.2 seconds   INR 0.9 0.8 - 1.2    Comment: (NOTE) INR goal varies based on device and disease states. Performed at [facility], [address], [place], [place] [address]   APTT     Status: None   Collection Time: 05/08/22  4:40 PM  Result Value Ref Range   aPTT 30 24 - 36 seconds    Comment: Performed at [facility], [address], [place], [place] [address]  CBC     Status: Abnormal   Collection Time: 05/08/22  4:40 PM  Result Value Ref Range   WBC 8.6 4.0 - 10.5 K/uL   RBC 4.34 3.87 - 5.11 MIL/uL   Hemoglobin 11.4 (L) 12.0 - 15.0  g/dL   HCT 36.7 36.0 - 46.0 %   MCV 84.6 80.0 - 100.0 fL   MCH 26.3 26.0 - 34.0 pg   MCHC 31.1 30.0 - 36.0 g/dL   RDW 16.2 (H) 11.5 - 15.5 %   Platelets 348 150 - 400 K/uL   nRBC 0.0 0.0 - 0.2 %    Comment: Performed at [facility], [address] [place], [place] [address]  Differential     Status: None   Collection Time: 05/08/22  4:40 PM  Result Value Ref Range   Neutrophils Relative % 66 %   Neutro Abs 5.6 1.7 - 7.7 K/uL   Lymphocytes Relative 24 %   Lymphs Abs 2.1 0.7 - 4.0 K/uL   Monocytes Relative 8 %   Monocytes Absolute 0.7 0.1 - 1.0 K/uL   Eosinophils Relative 2 %   Eosinophils Absolute 0.1 0.0 - 0.5 K/uL   Basophils Relative 0 %   Basophils Absolute 0.0 0.0 - 0.1 K/uL   Immature Granulocytes 0 %   Abs Immature Granulocytes 0.03 0.00 - 0.07 K/uL    Comment: Performed at [facility], [address], [place], [place] [address]  Comprehensive metabolic panel     Status: Abnormal   Collection Time: 05/08/22  4:40 PM  Result Value Ref Range   Sodium 138 135 - 145 mmol/L   Potassium 3.9 3.5 - 5.1 mmol/L   Chloride 98 98 - 111 mmol/L   CO2 30 22 - 32 mmol/L   Glucose, Bld 201 (H) 70 - 99 mg/dL    Comment: Glucose reference range applies only to samples taken after fasting for at least 8  hours.   BUN 15 8 - 23 mg/dL   Creatinine, Ser 0.69 0.44 - 1.00 mg/dL   Calcium 8.3 (L) 8.9 - 10.3 mg/dL   Total Protein 6.0 (L) 6.5 - 8.1 g/dL   Albumin 2.9 (L) 3.5 - 5.0 g/dL   AST 21 15 - 41 U/L   ALT 14 0 - 44 U/L   Alkaline Phosphatase 62 38 - 126 U/L   Total Bilirubin 0.7 0.3 - 1.2 mg/dL   GFR, Estimated >60 >60 mL/min    Comment: (NOTE) Calculated using the CKD-EPI Creatinine Equation (2021)    Anion gap 10 5 - 15    Comment: Performed at [facility], [address], [place], [place] [address]   MR Brain Wo Contrast (neuro protocol)  Result Date: 05/08/2022 CLINICAL DATA:  Provided history: Neuro deficit, acute, stroke suspected. EXAM: MRI HEAD WITHOUT CONTRAST TECHNIQUE: Multiplanar, multiecho pulse sequences of the brain and surrounding structures were obtained without intravenous contrast. COMPARISON:  Non-contrast head CT and CT angiogram head/neck performed earlier today 05/08/2022. Report from brain MRI 02/20/2009 (images currently unavailable). FINDINGS: Intermittently motion degraded examination. Most notably, the sagittal T1-weighted sequence is moderately motion degraded. Brain: Mild generalized parenchymal atrophy. Chronic cortical/subcortical infarct within right parietooccipital lobes. Mild chronic hemosiderin deposition within the infarction territory. Small chronic lacunar infarct within the left corona radiata. Mild-to-moderate multifocal T2 FLAIR hyperintense signal abnormality elsewhere within the cerebral white matter, nonspecific but compatible with chronic small vessel ischemic disease. Multiple T2 hyperintense foci within the bilateral deep gray nuclei, likely reflecting a combination of prominent perivascular spaces and chronic lacunar infarcts. Chronic lacunar infarcts within the pons. Background mild pontine chronic small vessel ischemic disease. There is no acute infarct. No evidence of an intracranial mass. No extra-axial fluid collection. No midline shift.  Vascular: Maintained flow voids within the proximal  large arterial vessels. Skull and upper cervical spine: No suspicious marrow lesion. Sinuses/Orbits: No mass or acute finding within the imaged orbits. Trace mucosal thickening within the left frontal, bilateral ethmoid and left maxillary sinuses. Other: Trace fluid within the bilateral mastoid air cells. IMPRESSION: 1. Intermittently motion degraded examination. 2. No evidence of an acute intracranial abnormality. Specifically, the diffusion-weighted imaging is of good quality and there is no evidence of an acute infarct. 3. Parenchymal atrophy, chronic small vessel ischemic disease and chronic infarcts as described. Electronically Signed   By: Kellie Simmering D.O.   On: 05/08/2022 17:56   CT ANGIO HEAD NECK W WO CM  Result Date: 05/08/2022 CLINICAL DATA:  Stroke/TIA, determine embolic source EXAM: CT ANGIOGRAPHY HEAD AND NECK TECHNIQUE: Multidetector CT imaging of the head and neck was performed using the standard protocol during bolus administration of intravenous contrast. Multiplanar CT image reconstructions and MIPs were obtained to evaluate the vascular anatomy. Carotid stenosis measurements (when applicable) are obtained utilizing NASCET criteria, using the distal internal carotid diameter as the denominator. RADIATION DOSE REDUCTION: This exam was performed according to the departmental dose-optimization program which includes automated exposure control, adjustment of the mA and/or kV according to patient size and/or use of iterative reconstruction technique. CONTRAST:  33m OMNIPAQUE IOHEXOL 350 MG/ML SOLN COMPARISON:  None Available. FINDINGS: CTA NECK FINDINGS Aortic arch: Great vessel origins are patent without significant stenosis. Right carotid system: No evidence of dissection, stenosis (50% or greater), or occlusion. Left carotid system: No evidence of dissection, stenosis (50% or greater), or occlusion. Vertebral arteries: Left dominant. Both  vertebral arteries are patent without hemodynamically significant stenosis. Skeleton: No evidence of acute abnormality on limited assessment. Multilevel facet and uncovertebral hypertrophy with varying degrees of neural foraminal stenosis. Other neck: No evidence of acute abnormality on limited assessment. Upper chest: Biapical pleuroparenchymal scarring. Review of the MIP images confirms the above findings CTA HEAD FINDINGS Anterior circulation: Bilateral intracranial ICAs are patent with mild-to-moderate narrowing due to calcific atherosclerosis. Moderate stenosis of the right M1 MCA. Severe stenosis of both proximal and more distal M2 MCA branches. A more distal M2 MCA branch stenosis is nearly occlusive. Severe stenosis of a distal left M2 MCA branch. Bilateral ACAs are patent without high-grade proximal stenosis. Posterior circulation: Bilateral intradural vertebral arteries and basilar artery are patent without significant stenosis. Bilateral PCAs are patent with multifocal severe right P2 PCA stenosis. Multifocal mild left P2 PCA stenosis. Venous sinuses: As permitted by contrast timing, patent. Review of the MIP images confirms the above findings IMPRESSION: 1. No emergent large vessel occlusion. 2. Severe bilateral M2 MCA branch stenosis. 3. Multifocal severe right P2 PCA stenosis. 4. Moderate right proximal M1 MCA stenosis. Findings discussed with Dr. ARory Percyvia telephone at 4:35 p.m. Electronically Signed   By: FMargaretha SheffieldM.D.   On: 05/08/2022 16:47   CT HEAD CODE STROKE WO CONTRAST  Result Date: 05/08/2022 CLINICAL DATA:  Code stroke. Neuro deficit, acute, stroke suspected. EXAM: CT HEAD WITHOUT CONTRAST TECHNIQUE: Contiguous axial images were obtained from the base of the skull through the vertex without intravenous contrast. RADIATION DOSE REDUCTION: This exam was performed according to the departmental dose-optimization program which includes automated exposure control, adjustment of the mA  and/or kV according to patient size and/or use of iterative reconstruction technique. COMPARISON:  None Available. FINDINGS: Brain: Remote right occipital infarct. Remote perforator infarct in the left basal ganglia additional patchy white matter hypodensities, nonspecific but compatible with chronic microvascular ischemic disease. No  evidence of acute large vascular territory infarct, acute hemorrhage, mass lesion, midline shift or hydrocephalus. Vascular: No hyperdense vessel identified. Skull: Normal. Negative for fracture or focal lesion. Sinuses/Orbits: No acute finding. Other: None. ASPECTS [facility] Stroke Program Early CT Score) total score (0-10 with 10 being normal): 10. IMPRESSION: 1. No evidence of acute intracranial abnormality.  ASPECTS is 10. 2. Remote infarcts and chronic microvascular ischemic disease, as above. Code stroke imaging results were communicated on 05/08/2022 at 4:03 pm to provider [email or web address] via telephone, who verbally acknowledged these results. Electronically Signed   By: Margaretha Sheffield M.D.   On: 05/08/2022 16:04    Pending Labs Unresulted Labs (From admission, onward)     Start     Ordered   05/08/22 1554  Urine rapid drug screen (hosp performed)  Once,   STAT        05/08/22 1554   05/08/22 1554  Urinalysis, Routine w reflex microscopic -Urine, Clean Catch  Once,   URGENT       Question:  Specimen Source  Answer:  Urine, Clean Catch   05/08/22 1554            Vitals/Pain Today's Vitals   05/08/22 1630 05/08/22 1638 05/08/22 1646 05/08/22 1700  BP: (!) 200/78   (!) 178/115  Pulse: 90   84  Resp: 20   (!) 21  Temp:  98.3 F (36.8 C)    SpO2: 93%   94%  Weight:   113.4 kg   Height:   5' 4"$  (1.626 m)   PainSc:   8      Isolation Precautions No active isolations  Medications Medications  clopidogrel (PLAVIX) tablet 75 mg (has no administration in time range)  iohexol (OMNIPAQUE) 350 MG/ML injection 75 mL (75 mLs Intravenous Contrast Given 05/08/22  1628)  clopidogrel (PLAVIX) tablet 300 mg (300 mg Oral Given 05/08/22 1807)    Mobility walks with person assist     Focused Assessments Cardiac Assessment Handoff:    Lab Results  Component Value Date   CKTOTAL 29 08/19/2006   CKMB 0.6 08/19/2006   TROPONINI <0.03 02/18/2017   No results found for: "DDIMER" Does the Patient currently have chest pain? No   , Neuro Assessment Handoff:  Swallow screen pass? Yes    NIH Stroke Scale  Dizziness Present: No Headache Present: Yes Interval: Neuro change Level of Consciousness (1a.)   : Alert, keenly responsive LOC Questions (1b. )   : Answers both questions correctly LOC Commands (1c. )   : Performs both tasks correctly Best Gaze (2. )  : Normal Visual (3. )  : No visual loss Facial Palsy (4. )    : Normal symmetrical movements Motor Arm, Left (5a. )   : No drift Motor Arm, Right (5b. ) : No drift Motor Leg, Left (6a. )  : No drift Motor Leg, Right (6b. ) : No drift Limb Ataxia (7. ): Absent Sensory (8. )  : Normal, no sensory loss Best Language (9. )  : No aphasia Dysarthria (10. ): Normal Extinction/Inattention (11.)   : No Abnormality Complete NIHSS TOTAL: 0 Last date known well: 05/08/22 Last time known well: 1450 Neuro Assessment:   Neuro Checks:   Initial (05/08/22 1649)  Has TPA been given? No If patient is a Neuro Trauma and patient is going to OR before floor call report to [place] nurse: [phone] or [phone]  , Pulmonary Assessment Handoff:  Lung sounds:   O2 Device: Room Air  R Recommendations: See Admitting Provider Note  Report given to:   Additional Notes:    

## 2022-05-08 NOTE — Progress Notes (Signed)
1539 Stroke cart activated and elert sent to [facility] for prearrival of pt with c/o L arm weakness/numbness and elevated BP. LKW 1450 per pt's son. 1549 pt arrives via stretcher with EMS to stroke assessment area. EDP Rogene Houston, MD) assessing pt upon arrival.  59 pt to CT 1557 [facility] paged 1559 [facility] Rory Percy, MD) connected via stroke cart and assessing pt in CT.  1628 pt transported back to from from CT. [facility] continuing assessment at this time.

## 2022-05-08 NOTE — H&P (Signed)
History and Physical    [name] [id number] DOB: 27-Jul-1950 DOA: 05/08/2022  PCP: Glenda Chroman, MD   Patient coming from: Home  I have personally briefly reviewed patient's old medical records in [place]  Chief Complaint: Slurred speech  HPI: [name] is a 72 y.o. female with medical history significant for diabetes mellitus, OSA, hypertension, diastolic CHF, coronary artery disease. Patient presented to the ED with complaints of numbness and weakness of her left upper and lower extremity.  She reports slurred speech, and family reported left facial droop.  She was last known well at 2:50 PM.  At the time of my evaluation, she tells me her speech has improved but is not completely back to baseline.  She was transiently on Eliquis 2021 for atrial fibrillation in setting of UTI with sepsis at [facility].  She is no longer on anticoagulation.   Now she also reports chest pain today started while she was in the ED resting.  Chronic chest pains, unrelated to activity, left-sided, radiating down her left arm.  She reports a history of heart attack.  ED Course: Blood pressure systolic 123456 to 123456.   Code stroke called. Head CT shows remote infarct and chronic microvascular disease. CTA head and neck no large vessel occlusion. Evaluated by teleneurologist, patient noted tPA candidate. MRI brain-intermittently motion degraded, no evidence of acute intracranial abnormality, no evidence of acute infarct.  Review of Systems: As per HPI all other systems reviewed and negative.  Past Medical History:  Diagnosis Date   Anxiety and depression    Arthritis    CAD (coronary artery disease)    Stent placement circumflex coronary 2007, catheterization 2008 patent stents. 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    High cholesterol    Hx of blood clots    Hypertension    MI (myocardial infarction) ([place])    2007   Obstructive sleep apnea    CPAP, setting ?2   Palpitations    Sleep apnea    Tachycardia     Past Surgical History:  Procedure Laterality Date   ABDOMINAL HYSTERECTOMY     Arm Surgery Bilateral    due to fall-pt broke both forearms   BIOPSY N/A 07/01/2013   Procedure: BIOPSY;  Surgeon: Rogene Houston, MD;  Location: [facility];  Service: Endoscopy;  Laterality: N/A;   CHOLECYSTECTOMY     COLONOSCOPY  08/06/2011   Procedure: COLONOSCOPY;  Surgeon: Rogene Houston, MD;  [facility];  Service: Endoscopy;  Laterality: N/A;  215   COLONOSCOPY WITH PROPOFOL N/A 12/31/2012   Procedure: COLONOSCOPY WITH PROPOFOL;  Surgeon: Rogene Houston, MD;  Location: [facility];  Service: Endoscopy;  Laterality: N/A;  in cecum at 0814, total withdrawal time 94mn   COLONOSCOPY WITH PROPOFOL N/A 02/05/2018   Procedure: COLONOSCOPY WITH PROPOFOL;  Surgeon: RRogene Houston MD;  [facility];  Service: Endoscopy;  Laterality: N/A;  1:25   CORONARY ANGIOPLASTY WITH STENT PLACEMENT  CYSTOSCOPY W/ URETERAL STENT PLACEMENT Right 11/10/2019   at [place], URETEROSCOPY AND STENT PLACEMENT Right 12/22/2019   Procedure: CYSTOSCOPY WITH RIGHT URETERAL STENT REMOVAL; RIGHT RETROGRADE PYELOGRAM,RIGHT URETEROSCOPY;  Surgeon: Cleon Gustin, MD;  Location: [facility];  Service: Urology;  Laterality: Right;   ESOPHAGEAL DILATION N/A 06/07/2015   Procedure: ESOPHAGEAL DILATION;  Surgeon: Rogene Houston, MD;  [facility];  Service: Endoscopy;  Laterality: N/A;   ESOPHAGOGASTRODUODENOSCOPY N/A 06/07/2015   Procedure:  ESOPHAGOGASTRODUODENOSCOPY (EGD);  Surgeon: Rogene Houston, MD;  [facility];  Service: Endoscopy;  Laterality: N/A;  2:45 - moved to 1:00 - Ann to notify pt   ESOPHAGOGASTRODUODENOSCOPY (EGD) WITH PROPOFOL N/A 12/31/2012   Procedure: ESOPHAGOGASTRODUODENOSCOPY (EGD) WITH PROPOFOL;  Surgeon: Rogene Houston, MD;  Location: [facility];  Service: Endoscopy;  Laterality: N/A;  GE junction 37,   ESOPHAGOGASTRODUODENOSCOPY (EGD) WITH PROPOFOL N/A 07/01/2013   Procedure: ESOPHAGOGASTRODUODENOSCOPY (EGD) WITH PROPOFOL;  Surgeon: Rogene Houston, MD;  Location: [facility];  Service: Endoscopy;  Laterality: N/A;  [place] N/A 12/31/2012   Procedure: MALONEY DILATION;  Surgeon: Rogene Houston, MD;  Location: [facility];  Service: Endoscopy;  Laterality: N/A;  used a # 54,#56   MALONEY DILATION N/A 07/01/2013   Procedure: MALONEY DILATION;  Surgeon: Rogene Houston, MD;  Location: [facility];  Service: Endoscopy;  Laterality: N/A;  54/58; no heme present   POLYPECTOMY  02/05/2018   Procedure: POLYPECTOMY;  Surgeon: Rogene Houston, MD;  [facility];  Service: Endoscopy;;  distal rectum (HS)   STONE EXTRACTION WITH BASKET Right 12/22/2019   Procedure: STONE EXTRACTION WITH BASKET;  Surgeon: Cleon Gustin, MD;  Location: [facility];  Service: Urology;  Laterality: Right;     reports that she has never smoked. She has never used smokeless tobacco. She reports that she does not drink alcohol and does not use drugs.  Allergies  Allergen Reactions   Amphetamine-Dextroamphetamine Swelling   Nitrofuran Derivatives Itching and Swelling   Amphetamine-Dextroamphet Er Swelling   Pregabalin Swelling   Topiramate Other (See Comments)    Tongue tingle   Verelan [Verapamil] Rash    Family History  Problem Relation Age of Onset   Heart attack Mother    Colon cancer Mother    Heart attack Father    Stroke Sister    Hemophilia Child    Cancer Other    Coronary artery disease Other     Cancer Brother    Cancer Maternal Aunt    Cancer Maternal Uncle    Cancer Paternal Aunt    Cancer Paternal Uncle    Cancer Brother    Prior to Admission medications   Medication Sig Start Date End Date Taking? Authorizing Provider  ACCU-CHEK GUIDE test strip USE AS DIRECTED 04/29/22   Cassandria Anger, MD  ALPRAZolam Duanne Moron) 0.5 MG tablet Take 0.5 mg by mouth 2 (two) times daily as needed for anxiety or sleep. 01/13/20   [provider]  Armodafinil 150 MG tablet Take 150 mg by mouth daily. 12/23/19   [provider]  aspirin EC 81 MG tablet Take 1 tablet (81 mg total) by mouth daily. 02/12/18   Rehman, Mechele Dawley, MD  atorvastatin (LIPITOR) 40 MG tablet Take 40 mg by mouth daily.    [provider]  B-D ULTRAFINE III SHORT PEN 31G X 8 MM MISC Inject into the skin 4 (four) times daily.  08/26/19   [provider]  Blood Glucose Monitoring Suppl (ACCU-CHEK GUIDE ME) w/Device KIT 1 Piece by Does not apply route as directed. 01/27/22   Cassandria Anger, MD  buPROPion (WELLBUTRIN SR) 100 MG 12 hr tablet Take 100 mg by mouth daily. 04/23/20   [provider]  Cholecalciferol (VITAMIN D3) 125 MCG (5000 UT) CAPS TAKE ONE CAPSULE BY MOUTH DAILY Patient taking differently: Take 5,000 Units by mouth daily. 04/04/19   Cassandria Anger, MD  diclofenac Sodium (VOLTAREN) 1 % GEL Apply topically. Patient not taking: Reported on 10/17/2021 09/23/19   [provider]  diltiazem (CARDIZEM CD) 240 MG 24 hr capsule Take 240 mg by mouth daily. 11/15/19   [provider]  doxepin (SINEQUAN) 25 MG capsule Take 75 mg by mouth at bedtime.  09/23/19   [provider]  estradiol (ESTRACE) 0.5 MG tablet Take 0.5 mg by mouth daily.  09/23/19   [provider]  ferrous sulfate 325 (65 FE) MG EC tablet Take by mouth. 06/20/21 06/20/22  [provider]  furosemide (LASIX) 40 MG tablet Take 1.5 tablets (60 mg total) by mouth daily. 07/11/21    Arnoldo Lenis, MD  gabapentin (NEURONTIN) 400 MG capsule Take 600 mg by mouth 3 (three) times daily. 11/24/16   [provider]  glipiZIDE (GLUCOTROL XL) 5 MG 24 hr tablet Take 5 mg by mouth daily. 09/16/21   [provider]  Insulin Lispro Prot & Lispro (HUMALOG 75/25 MIX) (75-25) 100 UNIT/ML Kwikpen Inject 80 Units into the skin 2 (two) times daily with a meal. 04/01/22   Nida, Marella Chimes, MD  isosorbide mononitrate (IMDUR) 30 MG 24 hr tablet TAKE 1/2 TABLET BY MOUTH DAILY (Need TO make APPOINTMENT FOR NEXT refills) 04/23/22   Arnoldo Lenis, MD  lisinopril (ZESTRIL) 5 MG tablet TAKE 1 TABLET BY MOUTH DAILY (Make APPOINTMENT FOR NEXT refills) 04/23/22   Arnoldo Lenis, MD  meloxicam (MOBIC) 7.5 MG tablet Take by mouth. 09/26/21   [provider]  metFORMIN (GLUCOPHAGE) 1000 MG tablet Take 1,000 mg by mouth 2 (two) times daily. 09/16/21   [provider]  metFORMIN (GLUCOPHAGE) 500 MG tablet TAKE 1 TABLET BY MOUTH TWICE DAILY Patient taking differently: Take 1,000 mg by mouth 2 (two) times daily with a meal. 03/01/19   Nida, Marella Chimes, MD  methocarbamol (ROBAXIN) 500 MG tablet Take 500 mg by mouth 2 (two) times daily. 04/12/21   [provider]  metoCLOPramide (REGLAN) 5 MG tablet Take 5 mg by mouth 3 (three) times daily as needed for nausea or vomiting.  11/09/17   [provider]  metoprolol succinate (TOPROL-XL) 100 MG 24 hr tablet Take 100 mg by mouth daily. Take with or immediately following a meal.    [provider]  montelukast (SINGULAIR) 10 MG tablet Take 10 mg by mouth at bedtime.  09/23/19   [provider]  nitroGLYCERIN (NITROSTAT) 0.4 MG SL tablet Place 1 tablet (0.4 mg total) under the tongue every 5 (five) minutes as needed for chest pain (up to 3 doses. If taking 3rd dose call 911). 08/08/20   Dunn, Lisbeth Renshaw N, PA-C  ondansetron (ZOFRAN) 4 MG tablet Take 4 mg by mouth every other day.    [provider]  oxyCODONE-acetaminophen (PERCOCET) 7.5-325 MG tablet Take 1 tablet by mouth every 6 (six) hours as needed. 12/31/21   [provider]  pantoprazole (PROTONIX) 40 MG tablet TAKE 1 TABLET BY MOUTH TWICE  DAILY BEFORE MEALS Patient taking differently: Take 40 mg by mouth 2 (two) times daily before a meal. 06/29/19   Rehman, Mechele Dawley, MD  polyethylene glycol (MIRALAX / GLYCOLAX) 17 g packet Take 17 g by mouth daily as needed for moderate constipation.    [provider]  potassium chloride (KLOR-CON) 10 MEQ tablet Take 10 mEq by mouth 2 (two) times daily. 11/21/20   [provider]  PROAIR HFA 108 (90 Base) MCG/ACT inhaler Inhale 2 puffs into the lungs every 4 (four) hours as needed for wheezing or shortness of breath.  11/24/16   [provider]  promethazine (PHENERGAN) 25 MG tablet Take 1 tablet (25 mg total) by mouth 4 (four) times daily as needed. Patient taking differently: Take 25 mg by mouth 4 (four) times daily as needed for nausea or vomiting. 12/29/19   McKenzie, Candee Furbish, MD  sucralfate (CARAFATE) 1 g tablet  09/30/19   [provider]  SURE COMFORT INS SYR 1CC/30G 30G X 5/16" 1 ML MISC 1 each by Other route daily. 01/25/20   [provider]  topiramate (TOPAMAX) 200 MG tablet Take 200 mg by mouth at bedtime. 11/24/16   [provider]    Physical Exam: Vitals:   05/08/22 1555 05/08/22 1630 05/08/22 1638 05/08/22 1646  BP: (!) 179/95 (!) 200/78    Pulse: 93 90    Resp: 19 20    Temp:   98.3 F (36.8 C)   SpO2: 97% 93%    Weight:    113.4 kg  Height:    5' 4"$  (1.626 m)    Constitutional: NAD, calm, comfortable Vitals:   05/08/22 1555 05/08/22 1630 05/08/22 1638 05/08/22 1646  BP: (!) 179/95 (!) 200/78    Pulse: 93 90    Resp: 19 20    Temp:   98.3 F (36.8 C)   SpO2: 97% 93%    Weight:    113.4 kg  Height:    5' 4"$  (1.626 m)   Eyes: PERRL, lids and conjunctivae normal ENMT: Mucous membranes are  moist.   Neck: normal, supple, no masses, no thyromegaly Respiratory: clear to auscultation bilaterally, no wheezing, no crackles. Normal respiratory effort. No accessory muscle use.  Cardiovascular: Regular rate and rhythm, no murmurs / rubs / gallops.  Trace to 1+ pitting to lower extremity edema to mid leg   Abdomen: no tenderness, no masses palpated. No hepatosplenomegaly. Bowel sounds positive.  Musculoskeletal: no clubbing / cyanosis. No joint deformity upper and lower extremities.  Skin: no rashes, lesions, ulcers. No induration Neurologic: Slight flattening of actually the right nasolabial fold on passive exam, but this disappears when she smiles, and face is otherwise symmetric, good grip strength bilaterally, 5/5 strength bilateral upper extremities, 4/5 strength bilateral lower extremity.  Speech clear and fluent without evidence of aphasia. Psychiatric: Normal judgment and insight. Alert and oriented x 3. Normal mood.   Labs on Admission: I have personally reviewed following labs and imaging studies  CBC: Recent Labs  Lab 05/08/22 1640  WBC 8.6  NEUTROABS 5.6  HGB 11.4*  HCT 36.7  MCV 84.6  PLT 0000000   Basic Metabolic Panel: Recent Labs  Lab 05/08/22 1640  NA 138  K 3.9  CL 98  CO2 30  GLUCOSE 201*  BUN 15  CREATININE 0.69  CALCIUM 8.3*   GFR: Estimated Creatinine Clearance: 81.9 mL/min (by C-G formula based on SCr of 0.69 mg/dL). Liver Function Tests: Recent Labs  Lab 05/08/22 1640  AST 21  ALT 14  ALKPHOS 62  BILITOT 0.7  PROT 6.0*  ALBUMIN 2.9*   Coagulation Profile: Recent Labs  Lab 05/08/22 1640  INR 0.9    Radiological Exams on Admission: CT ANGIO HEAD NECK W WO CM  Result Date: 05/08/2022 CLINICAL DATA:  Stroke/TIA, determine embolic source EXAM: CT ANGIOGRAPHY HEAD AND NECK TECHNIQUE: Multidetector CT imaging of the head and neck was performed using the standard protocol during bolus administration of intravenous contrast. Multiplanar CT  image reconstructions and MIPs were obtained to evaluate the vascular anatomy. Carotid stenosis measurements (when applicable) are obtained utilizing NASCET criteria, using the distal internal carotid diameter as the denominator. RADIATION DOSE REDUCTION: This exam was performed according to the departmental dose-optimization program which includes automated exposure control, adjustment of the mA and/or kV according to patient size and/or use of iterative reconstruction technique. CONTRAST:  32m OMNIPAQUE IOHEXOL 350 MG/ML SOLN COMPARISON:  None Available. FINDINGS: CTA NECK FINDINGS Aortic arch: Great vessel origins are patent without significant stenosis. Right carotid system: No evidence of dissection, stenosis (50% or greater), or occlusion. Left carotid system: No evidence of dissection, stenosis (50% or greater), or occlusion. Vertebral arteries: Left dominant. Both vertebral arteries are patent without hemodynamically significant stenosis. Skeleton: No evidence of acute abnormality on limited assessment. Multilevel facet and uncovertebral hypertrophy with varying degrees of neural foraminal stenosis. Other neck: No evidence of acute abnormality on limited assessment. Upper chest: Biapical pleuroparenchymal scarring. Review of the MIP images confirms the above findings CTA HEAD FINDINGS Anterior circulation: Bilateral intracranial ICAs are patent with mild-to-moderate narrowing due to calcific atherosclerosis. Moderate stenosis of the right M1 MCA. Severe stenosis of both proximal and more distal M2 MCA branches. A more distal M2 MCA branch stenosis is nearly occlusive. Severe stenosis of a distal left M2 MCA branch. Bilateral ACAs are patent without high-grade proximal stenosis. Posterior circulation: Bilateral intradural vertebral arteries and basilar artery are patent without significant stenosis. Bilateral PCAs are patent with multifocal severe right P2 PCA stenosis. Multifocal mild left P2 PCA stenosis.  Venous sinuses: As permitted by contrast timing, patent. Review of the MIP images confirms the above findings IMPRESSION: 1. No emergent large vessel occlusion. 2. Severe bilateral M2 MCA branch stenosis. 3. Multifocal severe right P2 PCA stenosis. 4. Moderate right proximal M1 MCA stenosis. Findings discussed with Dr. ARory Percyvia telephone at 4:35 p.m. Electronically Signed   By: FMargaretha SheffieldM.D.   On: 05/08/2022 16:47   CT HEAD CODE STROKE WO CONTRAST  Result Date: 05/08/2022 CLINICAL DATA:  Code stroke. Neuro deficit, acute, stroke suspected. EXAM: CT HEAD WITHOUT CONTRAST TECHNIQUE: Contiguous axial images were obtained from the base of the skull through the vertex without intravenous contrast. RADIATION DOSE REDUCTION: This exam was performed according to the departmental dose-optimization program which includes automated exposure control, adjustment of the mA and/or kV according to patient size and/or use of iterative reconstruction technique. COMPARISON:  None Available. FINDINGS: Brain: Remote right occipital infarct. Remote perforator infarct in the left basal ganglia additional patchy white matter hypodensities, nonspecific but compatible with chronic microvascular ischemic disease. No evidence of acute large vascular territory infarct, acute hemorrhage, mass lesion, midline shift or hydrocephalus. Vascular: No hyperdense vessel identified. Skull: Normal. Negative for fracture or focal lesion. Sinuses/Orbits: No acute finding. Other: None. ASPECTS ([facility] Early CT Score) total score (0-10 with 10 being normal): 10. IMPRESSION: 1. No evidence of acute intracranial abnormality.  ASPECTS is 10. 2. Remote infarcts and chronic microvascular  ischemic disease, as above. Code stroke imaging results were communicated on 05/08/2022 at 4:03 pm to provider [email or web address] via telephone, who verbally acknowledged these results. Electronically Signed   By: Margaretha Sheffield M.D.   On: 05/08/2022 16:04     EKG: Independently reviewed.  Sinus rhythm, rate 90, QTc 462.  No significant change from prior.  Assessment/Plan Principal Problem:   TIA (transient ischemic attack) Active Problems:   CAD (coronary artery disease)   Obstructive sleep apnea   Chronic diastolic heart failure (HCC)   Type 2 diabetes mellitus with vascular disease (HCC)   Essential hypertension, benign  Assessment and Plan: * TIA (transient ischemic attack) Reports slurred speech, numbness next to left upper and lower extremity.  Reports her speech is not quite back to baseline, on my evaluation speech is clear is clear and fluent without evidence of aphasia, both lower extremities are weak, and she has good and equal strength to bilateral upper extremities.  Head CT, CTA head and neck-unremarkable, negative for large vessel occlusion, shows moderate to severe stenosis of M2 MCA, P2 PCA, M1 MCA. - MRI both negative for immediately motion degraded, but no evidence of acute infarct, or acute cranial abnormality. -Code stroke called, patient not deemed to be TPA candidate, nonfocal exam. -Teleneurology recommends aspirin and Plavix, Atorvastatin 80 mg daily, allow for permissive hypertension. - ECHO  CAD (coronary artery disease) Reports intermittent chronic chest pains, not related to activity.  Reports chest pains today.  Follows with Dr. Harl Bowie. PCI to LCx- 2007.  Nuclear stress 08/2020 no ischemia. -Troponin x 2 -EKG unchanged. -Follow up Echo -Aspirin, Plavix, statins.  Essential hypertension, benign Allow for permissive hypertension -Hold imdur, metoprolol, lisinopril  Chronic diastolic heart failure (HCC) Stable and Compensated.  Last echo 07/2020, EF of 55 to 60%, with grade 1 DD. - Resume home Lasix 60 mg daily in a.m.  Obstructive sleep apnea On 2 L of O2 at night and during the day as needed   DVT prophylaxis: Lovenox Code Status: Full code Family Communication: None at bedside Disposition Plan:  1-  2 days Consults called:  Code stroke- teleneurologist in ED. Admission status: Obs  tele    Author: Bethena Roys, MD 05/08/2022 8:46 PM  For on call review [email or web address].

## 2022-05-08 NOTE — ED Notes (Signed)
Patient transported to MRI 

## 2022-05-08 NOTE — Assessment & Plan Note (Signed)
Stable and Compensated.  Last echo 07/2020, EF of 55 to 60%, with grade 1 DD. - Resume home Lasix 60 mg daily in a.m.

## 2022-05-08 NOTE — ED Provider Notes (Addendum)
[facility] Provider Note   CSN: [id number] Arrival date & time: 05/08/22  1549  An emergency department physician performed an initial assessment on this suspected stroke patient at 2.  History  Chief Complaint  Patient presents with   Code Stroke    [name] is a 72 y.o. female.  Patient brought in by EMS.  Last known normal was 1450.  Patient with a complaint of left upper extremity numbness weakness and speech difficulty.  At the bridge seem like both lower extremities were weak both upper extremities were weak.  Patient stated speech was off.  Code stroke initiated patient taken immediately to head CT.  Radiologist states no evidence of an acute infarct on head CT.  But there is evidence of old.  Patient's blood pressure by EMS was 220/140 heart rate 93 blood sugar was 187.  Blood pressure upon arrival here was 179/95 oxygen saturation is 97% on room air.  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  Patient not on any blood thinners.  Patient is on Lasix patient is on Imdur patient on metformin patient on Toprol patient on lisinopril possible to Toprol as she is no longer taking.  Surgical history significant for abdominal hysterectomy.  Patient is never used tobacco products.         Home Medications Prior to Admission medications   Medication Sig Start Date End Date Taking? Authorizing Provider  ACCU-CHEK GUIDE test strip USE AS DIRECTED 04/29/22   Cassandria Anger, MD  ALPRAZolam Duanne Moron) 0.5 MG tablet Take 0.5 mg by mouth 2 (two) times daily as needed for anxiety or sleep. 01/13/20   [provider]  Armodafinil 150 MG tablet Take 150 mg by mouth daily. 12/23/19   [provider]  aspirin EC 81 MG tablet Take 1 tablet (81 mg total) by mouth daily. 02/12/18   Rehman, Mechele Dawley, MD   atorvastatin (LIPITOR) 40 MG tablet Take 40 mg by mouth daily.    [provider]  B-D ULTRAFINE III SHORT PEN 31G X 8 MM MISC Inject into the skin 4 (four) times daily. 08/26/19   [provider]  Blood Glucose Monitoring Suppl (ACCU-CHEK GUIDE ME) w/Device KIT 1 Piece by Does not apply route as directed. 01/27/22   Cassandria Anger, MD  buPROPion (WELLBUTRIN SR) 100 MG 12 hr tablet Take 100 mg by mouth daily. 04/23/20   [provider]  Cholecalciferol (VITAMIN D3) 125 MCG (5000 UT) CAPS TAKE ONE CAPSULE BY MOUTH DAILY Patient taking differently: Take 5,000 Units by mouth daily. 04/04/19   Cassandria Anger, MD  diclofenac Sodium (VOLTAREN) 1 % GEL Apply topically. Patient not taking: Reported on 10/17/2021 09/23/19   [provider]  diltiazem (CARDIZEM CD) 240 MG 24 hr capsule Take 240 mg by mouth daily. 11/15/19   [provider]  doxepin (SINEQUAN) 25 MG capsule Take 75 mg by mouth at bedtime.  09/23/19   [provider]  estradiol (ESTRACE) 0.5 MG tablet Take 0.5 mg by mouth daily.  09/23/19   [provider]  ferrous sulfate 325 (65 FE) MG EC tablet Take by mouth. 06/20/21 06/20/22  [provider]  furosemide (LASIX) 40 MG tablet Take 1.5 tablets (60 mg total) by mouth daily. 07/11/21   Arnoldo Lenis, MD  gabapentin (NEURONTIN) 400 MG capsule Take 600 mg by mouth 3 (three) times  daily. 11/24/16   [provider]  glipiZIDE (GLUCOTROL XL) 5 MG 24 hr tablet Take 5 mg by mouth daily. 09/16/21   [provider]  Insulin Lispro Prot & Lispro (HUMALOG 75/25 MIX) (75-25) 100 UNIT/ML Kwikpen Inject 80 Units into the skin 2 (two) times daily with a meal. 04/01/22   Nida, Marella Chimes, MD  isosorbide mononitrate (IMDUR) 30 MG 24 hr tablet TAKE 1/2 TABLET BY MOUTH DAILY (Need TO make APPOINTMENT FOR NEXT refills) 04/23/22   Arnoldo Lenis, MD  lisinopril (ZESTRIL) 5 MG tablet TAKE 1 TABLET BY MOUTH DAILY (Make  APPOINTMENT FOR NEXT refills) 04/23/22   Arnoldo Lenis, MD  meloxicam (MOBIC) 7.5 MG tablet Take by mouth. 09/26/21   [provider]  metFORMIN (GLUCOPHAGE) 1000 MG tablet Take 1,000 mg by mouth 2 (two) times daily. 09/16/21   [provider]  metFORMIN (GLUCOPHAGE) 500 MG tablet TAKE 1 TABLET BY MOUTH TWICE DAILY Patient taking differently: Take 1,000 mg by mouth 2 (two) times daily with a meal. 03/01/19   Nida, Marella Chimes, MD  methocarbamol (ROBAXIN) 500 MG tablet Take 500 mg by mouth 2 (two) times daily. 04/12/21   [provider]  metoCLOPramide (REGLAN) 5 MG tablet Take 5 mg by mouth 3 (three) times daily as needed for nausea or vomiting.  11/09/17   [provider]  metoprolol succinate (TOPROL-XL) 100 MG 24 hr tablet Take 100 mg by mouth daily. Take with or immediately following a meal.    [provider]  montelukast (SINGULAIR) 10 MG tablet Take 10 mg by mouth at bedtime.  09/23/19   [provider]  nitroGLYCERIN (NITROSTAT) 0.4 MG SL tablet Place 1 tablet (0.4 mg total) under the tongue every 5 (five) minutes as needed for chest pain (up to 3 doses. If taking 3rd dose call 911). 08/08/20   Dunn, Lisbeth Renshaw N, PA-C  ondansetron (ZOFRAN) 4 MG tablet Take 4 mg by mouth every other day.    [provider]  oxyCODONE-acetaminophen (PERCOCET) 7.5-325 MG tablet Take 1 tablet by mouth every 6 (six) hours as needed. 12/31/21   [provider]  pantoprazole (PROTONIX) 40 MG tablet TAKE 1 TABLET BY MOUTH TWICE DAILY BEFORE MEALS Patient taking differently: Take 40 mg by mouth 2 (two) times daily before a meal. 06/29/19   Rehman, Mechele Dawley, MD  polyethylene glycol (MIRALAX / GLYCOLAX) 17 g packet Take 17 g by mouth daily as needed for moderate constipation.    [provider]  potassium chloride (KLOR-CON) 10 MEQ tablet Take 10 mEq by mouth 2 (two) times daily. 11/21/20   [provider]  PROAIR HFA 108 (90 Base) MCG/ACT  inhaler Inhale 2 puffs into the lungs every 4 (four) hours as needed for wheezing or shortness of breath.  11/24/16   [provider]  promethazine (PHENERGAN) 25 MG tablet Take 1 tablet (25 mg total) by mouth 4 (four) times daily as needed. Patient taking differently: Take 25 mg by mouth 4 (four) times daily as needed for nausea or vomiting. 12/29/19   McKenzie, Candee Furbish, MD  sucralfate (CARAFATE) 1 g tablet  09/30/19   [provider]  SURE COMFORT INS SYR 1CC/30G 30G X 5/16" 1 ML MISC 1 each by Other route daily. 01/25/20   [provider]  topiramate (TOPAMAX) 200 MG tablet Take 200 mg by mouth at bedtime. 11/24/16   [provider]      Allergies    Amphetamine-dextroamphetamine, Nitrofuran derivatives,  Amphetamine-dextroamphet er, Pregabalin, Topiramate, and Verelan [verapamil]    Review of Systems   Review of Systems  Constitutional:  Negative for chills and fever.  HENT:  Negative for ear pain and sore throat.   Eyes:  Negative for pain and visual disturbance.  Respiratory:  Negative for cough and shortness of breath.   Cardiovascular:  Negative for chest pain and palpitations.  Gastrointestinal:  Negative for abdominal pain and vomiting.  Genitourinary:  Negative for dysuria and hematuria.  Musculoskeletal:  Negative for arthralgias and back pain.  Skin:  Negative for color change and rash.  Neurological:  Positive for speech difficulty, weakness and numbness. Negative for seizures and syncope.  All other systems reviewed and are negative.   Physical Exam Updated Vital Signs BP (!) 200/78   Pulse 90   Temp 98.3 F (36.8 C)   Resp 20   Ht 1.626 m (5' 4"$ )   Wt 113.4 kg   SpO2 93%   BMI 42.91 kg/m  Physical Exam Vitals and nursing note reviewed.  Constitutional:      General: She is not in acute distress.    Appearance: Normal appearance. She is well-developed.  HENT:     Head: Normocephalic and atraumatic.  Eyes:     Extraocular  Movements: Extraocular movements intact.     Conjunctiva/sclera: Conjunctivae normal.     Pupils: Pupils are equal, round, and reactive to light.  Cardiovascular:     Rate and Rhythm: Normal rate and regular rhythm.     Heart sounds: No murmur heard. Pulmonary:     Effort: Pulmonary effort is normal. No respiratory distress.     Breath sounds: Normal breath sounds.  Abdominal:     Palpations: Abdomen is soft.     Tenderness: There is no abdominal tenderness.  Musculoskeletal:        General: No swelling.     Cervical back: Normal range of motion and neck supple.  Skin:    General: Skin is warm and dry.     Capillary Refill: Capillary refill takes less than 2 seconds.  Neurological:     Mental Status: She is alert.     Cranial Nerves: Cranial nerve deficit present.     Sensory: Sensory deficit present.     Motor: Weakness present.     Comments: Patient speech is off according to patient.  Able to formulate words but slow with doing it.  Patient seemed to have weakness to both upper extremities and both lower extremities.  May be left a little greater than right.  Patient states numbness to the left upper extremity.  Psychiatric:        Mood and Affect: Mood normal.     ED Results / Procedures / Treatments   Labs (all labs ordered are listed, but only abnormal results are displayed) Labs Reviewed  CBC - Abnormal; Notable for the following components:      Result Value   Hemoglobin 11.4 (*)    RDW 16.2 (*)    All other components within normal limits  DIFFERENTIAL  ETHANOL  PROTIME-INR  APTT  COMPREHENSIVE METABOLIC PANEL  RAPID URINE DRUG SCREEN, HOSP PERFORMED  URINALYSIS, ROUTINE W REFLEX MICROSCOPIC  I-STAT CHEM 8, ED    EKG EKG Interpretation  Date/Time:  Thursday May 08 2022 16:33:42 EST Ventricular Rate:  90 PR Interval:  207 QRS Duration: 92 QT Interval:  377 QTC Calculation: 462 R Axis:   52 Text Interpretation: Sinus rhythm Confirmed by Fredia Sorrow (  [id number]) on 05/08/2022 4:37:27 PM  Radiology CT ANGIO HEAD NECK W WO CM  Result Date: 05/08/2022 CLINICAL DATA:  Stroke/TIA, determine embolic source EXAM: CT ANGIOGRAPHY HEAD AND NECK TECHNIQUE: Multidetector CT imaging of the head and neck was performed using the standard protocol during bolus administration of intravenous contrast. Multiplanar CT image reconstructions and MIPs were obtained to evaluate the vascular anatomy. Carotid stenosis measurements (when applicable) are obtained utilizing NASCET criteria, using the distal internal carotid diameter as the denominator. RADIATION DOSE REDUCTION: This exam was performed according to the departmental dose-optimization program which includes automated exposure control, adjustment of the mA and/or kV according to patient size and/or use of iterative reconstruction technique. CONTRAST:  15m OMNIPAQUE IOHEXOL 350 MG/ML SOLN COMPARISON:  None Available. FINDINGS: CTA NECK FINDINGS Aortic arch: Great vessel origins are patent without significant stenosis. Right carotid system: No evidence of dissection, stenosis (50% or greater), or occlusion. Left carotid system: No evidence of dissection, stenosis (50% or greater), or occlusion. Vertebral arteries: Left dominant. Both vertebral arteries are patent without hemodynamically significant stenosis. Skeleton: No evidence of acute abnormality on limited assessment. Multilevel facet and uncovertebral hypertrophy with varying degrees of neural foraminal stenosis. Other neck: No evidence of acute abnormality on limited assessment. Upper chest: Biapical pleuroparenchymal scarring. Review of the MIP images confirms the above findings CTA HEAD FINDINGS Anterior circulation: Bilateral intracranial ICAs are patent with mild-to-moderate narrowing due to calcific atherosclerosis. Moderate stenosis of the right M1 MCA. Severe stenosis of both proximal and more distal M2 MCA branches. A more distal M2 MCA branch stenosis is  nearly occlusive. Severe stenosis of a distal left M2 MCA branch. Bilateral ACAs are patent without high-grade proximal stenosis. Posterior circulation: Bilateral intradural vertebral arteries and basilar artery are patent without significant stenosis. Bilateral PCAs are patent with multifocal severe right P2 PCA stenosis. Multifocal mild left P2 PCA stenosis. Venous sinuses: As permitted by contrast timing, patent. Review of the MIP images confirms the above findings IMPRESSION: 1. No emergent large vessel occlusion. 2. Severe bilateral M2 MCA branch stenosis. 3. Multifocal severe right P2 PCA stenosis. 4. Moderate right proximal M1 MCA stenosis. Findings discussed with Dr. ARory Percyvia telephone at 4:35 p.m. Electronically Signed   By: FMargaretha SheffieldM.D.   On: 05/08/2022 16:47   CT HEAD CODE STROKE WO CONTRAST  Result Date: 05/08/2022 CLINICAL DATA:  Code stroke. Neuro deficit, acute, stroke suspected. EXAM: CT HEAD WITHOUT CONTRAST TECHNIQUE: Contiguous axial images were obtained from the base of the skull through the vertex without intravenous contrast. RADIATION DOSE REDUCTION: This exam was performed according to the departmental dose-optimization program which includes automated exposure control, adjustment of the mA and/or kV according to patient size and/or use of iterative reconstruction technique. COMPARISON:  None Available. FINDINGS: Brain: Remote right occipital infarct. Remote perforator infarct in the left basal ganglia additional patchy white matter hypodensities, nonspecific but compatible with chronic microvascular ischemic disease. No evidence of acute large vascular territory infarct, acute hemorrhage, mass lesion, midline shift or hydrocephalus. Vascular: No hyperdense vessel identified. Skull: Normal. Negative for fracture or focal lesion. Sinuses/Orbits: No acute finding. Other: None. ASPECTS ([facility] Early CT Score) total score (0-10 with 10 being normal): 10. IMPRESSION:  1. No evidence of acute intracranial abnormality.  ASPECTS is 10. 2. Remote infarcts and chronic microvascular ischemic disease, as above. Code stroke imaging results were communicated on 05/08/2022 at 4:03 pm to provider Glee Lashomb via telephone, who verbally acknowledged these results. Electronically Signed   By:  Margaretha Sheffield M.D.   On: 05/08/2022 16:04    Procedures Procedures    Medications Ordered in ED Medications  clopidogrel (PLAVIX) tablet 300 mg (has no administration in time range)  clopidogrel (PLAVIX) tablet 75 mg (has no administration in time range)  iohexol (OMNIPAQUE) 350 MG/ML injection 75 mL (75 mLs Intravenous Contrast Given 05/08/22 1628)    ED Course/ Medical Decision Making/ A&P                             Medical Decision Making Amount and/or Complexity of Data Reviewed Labs: ordered. Radiology: ordered.  Risk Decision regarding hospitalization.   Patient last known normal at 1450.  The reported speech abnormalities left upper extremity weakness and numbness.  Patient has significant risk factors for stroke head CT suggest old strokes.  Patient had code stroke order set initiated.  Since she was in the 4 and half hour window.  Teleneurologist is not sure whether there is a stroke or not.  He is ordering CT angio head and neck certainly not a candidate at this time for TNK.  He also sort of got global weakness.  After the CT angio head and neck MRI will be done.  CRITICAL CARE Performed by: Fredia Sorrow Total critical care time: 60 minutes Critical care time was exclusive of separately billable procedures and treating other patients. Critical care was necessary to treat or prevent imminent or life-threatening deterioration. Critical care was time spent personally by me on the following activities: development of treatment plan with patient and/or surrogate as well as nursing, discussions with consultants, evaluation of patient's response to  treatment, examination of patient, obtaining history from patient or surrogate, ordering and performing treatments and interventions, ordering and review of laboratory studies, ordering and review of radiographic studies, pulse oximetry and re-evaluation of patient's condition.  D/W Rory Percy neurohospitalist got additional information from the patient's son it sounds like there was left facial droop.  He thinks this is probably a TIA and is recommending admission CT angio head and neck without any acute findings.  MRI ordered but he is recommending admission regardless.  Will contact hospitalist for admission stated patient could stay up here.  Final Clinical Impression(s) / ED Diagnoses Final diagnoses:  TIA (transient ischemic attack)    Rx / DC Orders ED Discharge Orders     None         Fredia Sorrow, MD 05/08/22 1616    Fredia Sorrow, MD 05/08/22 [phone]

## 2022-05-08 NOTE — ED Notes (Signed)
Pt taken to CT on EMS stretcher with 2 RNs at bedside and tele-stroke RN on the monitor.

## 2022-05-08 NOTE — ED Triage Notes (Signed)
Pt brought in by [facility] emergency traffic with code stroke called in the field. [id number] and witnessed by her husband. EMS reports pt has left arm numbness and weakness along with slurred speech. BP 220/140, HR 93, CBG 187 for EMS.   Dr. Roderic Palau at EMS bay doors to assess pt upon arrival.

## 2022-05-08 NOTE — ED Notes (Signed)
Pt ambulatory to BR with standby assist.

## 2022-05-08 NOTE — Assessment & Plan Note (Signed)
Reports intermittent chronic chest pains, not related to activity.  Reports chest pains today.  Follows with Dr. Harl Bowie. PCI to LCx- 2007.  Nuclear stress 08/2020 no ischemia. -Troponin x 2 -EKG unchanged. -Follow up Echo -Aspirin, Plavix, statins.

## 2022-05-08 NOTE — Assessment & Plan Note (Signed)
On 2 L of O2 at night and during the day as needed

## 2022-05-09 ENCOUNTER — Observation Stay (HOSPITAL_COMMUNITY): Payer: 59

## 2022-05-09 ENCOUNTER — Other Ambulatory Visit (HOSPITAL_COMMUNITY): Payer: Self-pay | Admitting: *Deleted

## 2022-05-09 DIAGNOSIS — K760 Fatty (change of) liver, not elsewhere classified: Secondary | ICD-10-CM | POA: Diagnosis not present

## 2022-05-09 DIAGNOSIS — K5730 Diverticulosis of large intestine without perforation or abscess without bleeding: Secondary | ICD-10-CM | POA: Diagnosis not present

## 2022-05-09 DIAGNOSIS — G459 Transient cerebral ischemic attack, unspecified: Secondary | ICD-10-CM

## 2022-05-09 DIAGNOSIS — K5900 Constipation, unspecified: Secondary | ICD-10-CM | POA: Diagnosis not present

## 2022-05-09 LAB — URINALYSIS, W/ REFLEX TO CULTURE (INFECTION SUSPECTED)
Bilirubin Urine: NEGATIVE
Glucose, UA: >=500 mg/dL — AB
Ketones, ur: NEGATIVE mg/dL
Nitrite: POSITIVE — AB
Protein, ur: 30 mg/dL — AB
Specific Gravity, Urine: 1.01 (ref 1.005–1.030)
WBC, UA: >50 WBC/hpf (ref 0–5)
pH: 6 (ref 5.0–8.0)

## 2022-05-09 LAB — GLUCOSE, CAPILLARY
Glucose-Capillary: 221 mg/dL — ABNORMAL HIGH (ref 70–99)
Glucose-Capillary: 216 mg/dL — ABNORMAL HIGH (ref 70–99)
Glucose-Capillary: 240 mg/dL — ABNORMAL HIGH (ref 70–99)
Glucose-Capillary: 291 mg/dL — ABNORMAL HIGH (ref 70–99)
Glucose-Capillary: 156 mg/dL — ABNORMAL HIGH (ref 70–99)

## 2022-05-09 LAB — LIPID PANEL
Cholesterol: 152 mg/dL (ref 0–200)
HDL: 51 mg/dL (ref >40)
LDL Cholesterol: 78 mg/dL (ref 0–99)
Total CHOL/HDL Ratio: 3 RATIO
Triglycerides: 113 mg/dL (ref <150)
VLDL: 23 mg/dL (ref 0–40)

## 2022-05-09 LAB — ECHOCARDIOGRAM COMPLETE
Area-P 1/2: 5.27 cm2
Height: 64 in
S' Lateral: 2.9 cm
Weight: 3915.37 oz

## 2022-05-09 LAB — HIV ANTIBODY (ROUTINE TESTING W REFLEX): HIV Screen 4th Generation wRfx: NONREACTIVE

## 2022-05-09 LAB — HEMOGLOBIN A1C
Hgb A1c MFr Bld: 8.3 % — ABNORMAL HIGH (ref 4.8–5.6)
Mean Plasma Glucose: 191.51 mg/dL

## 2022-05-09 MED ORDER — METOPROLOL SUCCINATE ER 50 MG PO TB24
100.0000 mg | ORAL_TABLET | Freq: Every day | ORAL | Status: DC
  Administered 2022-05-09 – 2022-05-12 (×4): 100 mg via ORAL
  Filled 2022-05-09 (×4): qty 2

## 2022-05-09 MED ORDER — BISACODYL 10 MG RE SUPP
10.0000 mg | Freq: Once | RECTAL | Status: AC
  Administered 2022-05-09: 10 mg via RECTAL
  Filled 2022-05-09: qty 1

## 2022-05-09 MED ORDER — OXYCODONE HCL 5 MG PO TABS
5.0000 mg | ORAL_TABLET | Freq: Three times a day (TID) | ORAL | Status: DC | PRN
  Administered 2022-05-09 – 2022-05-12 (×5): 5 mg via ORAL
  Filled 2022-05-09 (×5): qty 1

## 2022-05-09 MED ORDER — SODIUM CHLORIDE 0.9 % IV SOLN
1.0000 g | INTRAVENOUS | Status: DC
  Administered 2022-05-10 – 2022-05-11 (×3): 1 g via INTRAVENOUS
  Filled 2022-05-09 (×3): qty 10

## 2022-05-09 MED ORDER — IOHEXOL 300 MG/ML  SOLN
100.0000 mL | Freq: Once | INTRAMUSCULAR | Status: AC | PRN
  Administered 2022-05-09: 100 mL via INTRAVENOUS

## 2022-05-09 MED ORDER — PERFLUTREN LIPID MICROSPHERE
1.0000 mL | INTRAVENOUS | Status: AC | PRN
  Administered 2022-05-09: 3 mL via INTRAVENOUS

## 2022-05-09 MED ORDER — IOHEXOL 9 MG/ML PO SOLN
ORAL | Status: AC
  Filled 2022-05-09: qty 1000

## 2022-05-09 MED ORDER — SENNOSIDES-DOCUSATE SODIUM 8.6-50 MG PO TABS
2.0000 | ORAL_TABLET | Freq: Every day | ORAL | Status: DC
  Administered 2022-05-09 – 2022-05-11 (×3): 2 via ORAL
  Filled 2022-05-09 (×3): qty 2

## 2022-05-09 NOTE — Plan of Care (Signed)
  Problem: Acute Rehab OT Goals (only OT should resolve) Goal: Pt. Will Perform Grooming Flowsheets (Taken 05/09/2022 1006) Pt Will Perform Grooming:  with modified independence  sitting Goal: Pt. Will Perform Lower Body Bathing Flowsheets (Taken 05/09/2022 1006) Pt Will Perform Lower Body Bathing:  with mod assist  sitting/lateral leans Goal: Pt. Will Perform Upper Body Dressing Flowsheets (Taken 05/09/2022 1006) Pt Will Perform Upper Body Dressing:  with modified independence  sitting Goal: Pt. Will Transfer To Toilet Flowsheets (Taken 05/09/2022 1006) Pt Will Transfer to Toilet:  with modified independence  ambulating Goal: Pt. Will Perform Toileting-Clothing Manipulation Flowsheets (Taken 05/09/2022 1006) Pt Will Perform Toileting - Clothing Manipulation and hygiene:  with modified independence  sitting/lateral leans Goal: Pt/Caregiver Will Perform Home Exercise Program Flowsheets (Taken 05/09/2022 1006) Pt/caregiver will Perform Home Exercise Program:  Increased ROM  Increased strength  Independently  Both right and left upper extremity  Chrisa Hassan OT, MOT

## 2022-05-09 NOTE — Care Management Obs Status (Signed)
[facility] NOTIFICATION   Patient Details  Name: [name] MRN: [id number] Date of Birth: [birth year]   Medicare Observation Status Notification Given:  Yes    [name] 05/09/2022, 11:56 AM

## 2022-05-09 NOTE — Care Plan (Signed)
Reviewed MRI brain without contrast.  Did not show any acute stroke.  Agree with Dr.Arora's plan.  Discussed with Dr. Denton Brick. Please do not hesitate to contact us for any further questions or concerns  Cotton Beckley Barbra Sarks

## 2022-05-09 NOTE — Progress Notes (Signed)
*  PRELIMINARY RESULTS* Echocardiogram 2D Echocardiogram has been performed with Definity.  Samuel Germany 05/09/2022, 2:37 PM

## 2022-05-09 NOTE — Progress Notes (Signed)
SLP Cancellation Note  Patient Details Name: [name] MRN: [id number] DOB: June 23, 1950   Cancelled treatment:       Reason Eval/Treat Not Completed: SLP screened, no needs identified, will sign off. Slurred speech has resolved and language/cognition are functioning at baseline. There are no further ST needs noted at this time, ST will sign off, thank you.  [name] H. Roddie Mc, CCC-SLP Speech Language Pathologist    Wende Bushy 05/09/2022, 9:54 AM

## 2022-05-09 NOTE — Evaluation (Signed)
Physical Therapy Evaluation Patient Details Name: [name] MRN: [id number] DOB: November 18, 1950 Today's Date: 05/09/2022  History of Present Illness  [name] is a 72 y.o. female with medical history significant for diabetes mellitus, OSA, hypertension, diastolic CHF, coronary artery disease.  Patient presented to the ED with complaints of numbness and weakness of her left upper and lower extremity.  She reports slurred speech, and family reported left facial droop.  She was last known well at 2:50 PM.  At the time of my evaluation, she tells me her speech has improved but is not completely back to baseline.  She was transiently on Eliquis 2021 for atrial fibrillation in setting of UTI with sepsis at [facility].  She is no longer on anticoagulation.    Now she also reports chest pain today started while she was in the ED resting.  Chronic chest pains, unrelated to activity, left-sided, radiating down her left arm.  She reports a history of heart attack.   Clinical Impression  Patient limited for functional mobility as stated below secondary to BLE weakness, fatigue and poor standing balance. Patient able to transition to seated EOB without assist but requires use of bed rails. She demonstrates good sitting balance and sitting tolerance at EOB. She requires min/mod assist to transfer to standing with RW and fatigues quickly with initial attempt requiring seated rest break. Able to complete again and ambulate to toilet requiring assist for balance and weakness. Patient limited by fatigue and is assisted back to bed at end of session. Patient will benefit from continued physical therapy in hospital and recommended venue below to increase strength, balance, endurance for safe ADLs and gait.        Recommendations for follow up therapy are one component of a multi-disciplinary discharge planning process, led by the attending physician.  Recommendations may be updated based on patient  status, additional functional criteria and insurance authorization.  Follow Up Recommendations Skilled nursing-short term rehab (<3 hours/day) Can patient physically be transported by private vehicle: Yes    Assistance Recommended at Discharge Intermittent Supervision/Assistance  Patient can return home with the following  A lot of help with walking and/or transfers;A lot of help with bathing/dressing/bathroom    Equipment Recommendations None recommended by PT  Recommendations for Other Services       Functional Status Assessment Patient has had a recent decline in their functional status and demonstrates the ability to make significant improvements in function in a reasonable and predictable amount of time.     Precautions / Restrictions Precautions Precautions: Fall Restrictions Weight Bearing Restrictions: No      Mobility  Bed Mobility Overal bed mobility: Modified Independent             General bed mobility comments: slow, labored, heavy use of bedrails    Transfers Overall transfer level: Needs assistance Equipment used: Rolling walker (2 wheels) Transfers: Sit to/from Stand Sit to Stand: Min assist, Mod assist           General transfer comment: assist to power up to standing with RW for BLE weakness    Ambulation/Gait Ambulation/Gait assistance: Min assist, Mod assist Gait Distance (Feet): 15 Feet Assistive device: Rolling walker (2 wheels) Gait Pattern/deviations: Knees buckling, Step-through pattern, Decreased stride length Gait velocity: decreased     General Gait Details: slow, labored, intermittent unsteadiness  Stairs            Wheelchair Mobility    Modified Rankin (Stroke Patients Only)  Balance Overall balance assessment: Needs assistance Sitting-balance support: No upper extremity supported, Feet supported Sitting balance-Leahy Scale: Good Sitting balance - Comments: seated EOB   Standing balance support:  Reliant on assistive device for balance, Bilateral upper extremity supported Standing balance-Leahy Scale: Poor Standing balance comment: fair/poor with RW                             Pertinent Vitals/Pain Pain Assessment Pain Assessment: 0-10 Pain Score: 8  Pain Location: chest/stomach Pain Descriptors / Indicators: Grimacing, Guarding Pain Intervention(s): Limited activity within patient's tolerance, Monitored during session, Repositioned    Home Living Family/patient expects to be discharged to:: Private residence Living Arrangements: Spouse/significant other Available Help at Discharge: Family;Personal care attendant Type of Home: House Home Access: Ramped entrance       Home Layout: One level Home Equipment: Rollator (4 wheels);BSC/3in1;Shower seat;Wheelchair - manual      Prior Function Prior Level of Function : Needs assist             Mobility Comments: Patient states household ambulation with rollator ADLs Comments: assisted by aid with all ADL, ait 5x/week for 8 hours/day     Hand Dominance        Extremity/Trunk Assessment   Upper Extremity Assessment Upper Extremity Assessment: Defer to OT evaluation    Lower Extremity Assessment Lower Extremity Assessment: Generalized weakness    Cervical / Trunk Assessment Cervical / Trunk Assessment: Normal  Communication   Communication: No difficulties  Cognition Arousal/Alertness: Awake/alert Behavior During Therapy: WFL for tasks assessed/performed Overall Cognitive Status: Within Functional Limits for tasks assessed                                          General Comments      Exercises     Assessment/Plan    PT Assessment Patient needs continued PT services  PT Problem List Decreased strength;Decreased activity tolerance;Decreased balance;Decreased mobility       PT Treatment Interventions DME instruction;Therapeutic exercise;Gait training;Balance  training;Stair training;Neuromuscular re-education;Functional mobility training;Therapeutic activities;Patient/family education    PT Goals (Current goals can be found in the Care Plan section)  Acute Rehab PT Goals Patient Stated Goal: go to rehab and go home PT Goal Formulation: With patient Time For Goal Achievement: 05/23/22 Potential to Achieve Goals: Good    Frequency Min 3X/week     Co-evaluation PT/OT/SLP Co-Evaluation/Treatment: Yes Reason for Co-Treatment: To address functional/ADL transfers PT goals addressed during session: Mobility/safety with mobility;Balance;Proper use of DME;Strengthening/ROM         AM-PAC PT "6 Clicks" Mobility  Outcome Measure Help needed turning from your back to your side while in a flat bed without using bedrails?: A Little Help needed moving from lying on your back to sitting on the side of a flat bed without using bedrails?: A Lot Help needed moving to and from a bed to a chair (including a wheelchair)?: A Lot Help needed standing up from a chair using your arms (e.g., wheelchair or bedside chair)?: A Lot Help needed to walk in hospital room?: A Lot Help needed climbing 3-5 steps with a railing? : A Lot 6 Click Score: 13    End of Session Equipment Utilized During Treatment: Gait belt Activity Tolerance: Patient tolerated treatment well;Patient limited by fatigue Patient left: in bed;with call bell/phone within reach;with bed alarm set  Nurse Communication: Mobility status PT Visit Diagnosis: Unsteadiness on feet (R26.81);Other abnormalities of gait and mobility (R26.89);Muscle weakness (generalized) (M62.81)    Time: [id number] PT Time Calculation (min) (ACUTE ONLY): 23 min   Charges:   PT Evaluation $PT Eval Low Complexity: 1 Low PT Treatments $Therapeutic Activity: 8-22 mins        9:17 AM, 05/09/22 Mearl Latin PT, DPT Physical Therapist at [facility]

## 2022-05-09 NOTE — TOC Initial Note (Addendum)
Transition of Care [facility]) - Initial/Assessment Note    Patient Details  Name: [name] MRN: [id number] Date of Birth: 14-Dec-1950  [facility] CM/SW Contact:    Boneta Lucks, RN Phone Number: 05/09/2022, 1:15 PM  Clinical Narrative:   Patient admitted with TIA, lives at home with her spouse. Pt is recommending SNF. Patient want to go to [facility]. PASSR is pending. Uploaded documents requested. Insurance Auth started, TOC to follow for offers and to add facility to [employer].       Addendum Vedia Coffer PASSR#   [id number]  Expected Discharge Plan: Skilled Nursing Facility Barriers to Discharge: Continued Medical Work up  Patient Goals and CMS Choice Patient states their goals for this hospitalization and ongoing recovery are:: agreeable to SNF CMS Medicare.gov Compare Post Acute Care list provided to:: Patient Choice offered to / list presented to : Patient    Expected Discharge Plan and Services     Living arrangements for the past 2 months: Single Family Home                     Prior Living Arrangements/Services Living arrangements for the past 2 months: Single Family Home Lives with:: Spouse          Activities of Daily Living Home Assistive Devices/Equipment: Environmental consultant (specify type), Shower chair with back, Bedside commode/3-in-1, Blood pressure cuff, CBG Meter ADL Screening (condition at time of admission) Patient's cognitive ability adequate to safely complete daily activities?: Yes Is the patient deaf or have difficulty hearing?: No Does the patient have difficulty seeing, even when wearing glasses/contacts?: Yes Does the patient have difficulty concentrating, remembering, or making decisions?: No Patient able to express need for assistance with ADLs?: Yes Does the patient have difficulty dressing or bathing?: Yes Independently performs ADLs?: Yes (appropriate for developmental age) Does the patient have difficulty walking or climbing stairs?:  Yes Weakness of Legs: Both Weakness of Arms/Hands: Both  Permission Sought/Granted        Emotional Assessment   Attitude/Demeanor/Rapport: Engaged Affect (typically observed): Accepting Orientation: : Oriented to Self, Oriented to Place, Oriented to  Time, Oriented to Situation Alcohol / Substance Use: Not Applicable Psych Involvement: No (comment)  Admission diagnosis:  TIA (transient ischemic attack) [G45.9] Patient Active Problem List   Diagnosis Date Noted   TIA (transient ischemic attack) 05/08/2022   Unstable angina ([place]) 08/07/2020   Nephrolithiasis 10/10/2019   Vitamin D deficiency 07/08/2018   History of colonic polyps 01/06/2018   Family hx of colon cancer 01/06/2018   Uncontrolled type 2 diabetes mellitus with hyperglycemia ([place]) 10/07/2017   Primary osteoarthritis of left knee 05/15/2017   Primary osteoarthritis of right knee 05/15/2017   Non-cardiac chest pain 02/17/2017   Essential hypertension, benign 05/14/2015   Morbid obesity ([place]) 05/02/2015   Dysphagia 04/24/2015   Arthrosis of right acromioclavicular joint 11/13/2014   Spondylosis of lumbar region without myelopathy or radiculopathy 11/13/2014   Painful diabetic neuropathy ([place]) 06/26/2014   Diarrhea 03/02/2013   Rectal irritation 03/02/2013   Chronic low back pain without sciatica 07/29/2012   Muscle weakness (generalized) 07/29/2012   Spinal stenosis, lumbar region, without neurogenic claudication 07/19/2012   Irritable bowel syndrome 08/24/2011   Pancreatitis 10/17/2010   CAD (coronary artery disease)    Type 2 diabetes mellitus with vascular disease ([place])    Diabetic polyneuropathy ([place])    Anxiety and depression    Mixed hyperlipidemia 09/20/2009   Chronic diastolic heart failure ([place]) 09/20/2009  Obstructive sleep apnea 09/04/2009   TACHYCARDIA 11/08/2008   PALPITATIONS 11/08/2008   CHEST PAIN-UNSPECIFIED 11/08/2008   PCP:  Glenda Chroman, MD Pharmacy:   [facility], [place] [id number] [place] [id number] Phone: [phone] Fax: [phone]     Social Determinants of Health (SDOH) Social History: SDOH Screenings   Food Insecurity: No Food Insecurity (05/08/2022)  Housing: Low Risk  (05/08/2022)  Transportation Needs: No Transportation Needs (05/08/2022)  Utilities: Not At Risk (05/08/2022)  Depression (PHQ2-9): Medium Risk (10/17/2021)  Tobacco Use: Low Risk  (05/08/2022)   SDOH Interventions:    Readmission Risk Interventions     No data to display

## 2022-05-09 NOTE — Evaluation (Signed)
Occupational Therapy Evaluation Patient Details Name: [name] MRN: [id number] DOB: 26-Oct-1950 Today's Date: 05/09/2022   History of Present Illness [name] is a 72 y.o. female with medical history significant for diabetes mellitus, OSA, hypertension, diastolic CHF, coronary artery disease.  Patient presented to the ED with complaints of numbness and weakness of her left upper and lower extremity.  She reports slurred speech, and family reported left facial droop.  She was last known well at 2:50 PM.  At the time of my evaluation, she tells me her speech has improved but is not completely back to baseline.  She was transiently on Eliquis 2021 for atrial fibrillation in setting of UTI with sepsis at [facility].  She is no longer on anticoagulation.    Now she also reports chest pain today started while she was in the ED resting.  Chronic chest pains, unrelated to activity, left-sided, radiating down her left arm.  She reports a history of heart attack.   Clinical Impression   Pt agreeable to PT and OT co-evaluation. Pt requires assist at baseline. Pt is weak and requires assist to boost from EOB and to ambulate to the toilet. Pt assisted for ADL's at baseline. More assist needed than usual for ambulation and transfers. Pt will benefit from continued OT in the hospital and recommended venue below to increase strength, balance, and endurance for safe ADL's.         Recommendations for follow up therapy are one component of a multi-disciplinary discharge planning process, led by the attending physician.  Recommendations may be updated based on patient status, additional functional criteria and insurance authorization.   Follow Up Recommendations  Skilled nursing-short term rehab (<3 hours/day)     Assistance Recommended at Discharge Intermittent Supervision/Assistance  Patient can return home with the following A lot of help with walking and/or transfers;A lot of help with  bathing/dressing/bathroom;Assistance with cooking/housework;Assist for transportation    Functional Status Assessment  Patient has had a recent decline in their functional status and/or demonstrates limited ability to make significant improvements in function in a reasonable and predictable amount of time  Equipment Recommendations  None recommended by OT           Precautions / Restrictions Precautions Precautions: Fall Restrictions Weight Bearing Restrictions: No      Mobility Bed Mobility Overal bed mobility: Modified Independent             General bed mobility comments: slow, labored, heavy use of bedrails    Transfers Overall transfer level: Needs assistance Equipment used: Rolling walker (2 wheels) Transfers: Sit to/from Stand Sit to Stand: Min assist, Mod assist           General transfer comment: assist to power up to standing with RW for BLE weakness      Balance Overall balance assessment: Needs assistance Sitting-balance support: No upper extremity supported, Feet supported Sitting balance-Leahy Scale: Good Sitting balance - Comments: seated EOB   Standing balance support: Reliant on assistive device for balance, Bilateral upper extremity supported Standing balance-Leahy Scale: Poor Standing balance comment: fair/poor with RW                           ADL either performed or assessed with clinical judgement   ADL Overall ADL's : Needs assistance/impaired     Grooming: Sitting;Minimal assistance   Upper Body Bathing: Minimal assistance;Sitting   Lower Body Bathing: Maximal assistance;Sitting/lateral leans  Upper Body Dressing : Minimal assistance;Sitting   Lower Body Dressing: Maximal assistance;Sitting/lateral leans   Toilet Transfer: Moderate assistance;Minimal assistance;Ambulation;Rolling walker (2 wheels) Toilet Transfer Details (indicate cue type and reason): EOB to toilet and back. Toileting- Clothing Manipulation  and Hygiene: Set up;Sitting/lateral lean Toileting - Clothing Manipulation Details (indicate cue type and reason): Pt was able to compelte peri-care seated on the toilet.     Functional mobility during ADLs: Minimal assistance;Moderate assistance;Rolling walker (2 wheels)       Vision Baseline Vision/History: 1 Wears glasses Ability to See in Adequate Light: 1 Impaired Patient Visual Report: No change from baseline Vision Assessment?: No apparent visual deficits Additional Comments: Outisde baseline impairments.                Pertinent Vitals/Pain Pain Assessment Pain Assessment: 0-10 Pain Score: 8  Pain Location: chest/stomach Pain Descriptors / Indicators: Grimacing, Guarding Pain Intervention(s): Limited activity within patient's tolerance, Monitored during session, Repositioned     Hand Dominance  R hand    Extremity/Trunk Assessment Upper Extremity Assessment Upper Extremity Assessment: Generalized weakness (3-/5 shoulder flexion. Generally weak overall.)   Lower Extremity Assessment Lower Extremity Assessment: Defer to PT evaluation   Cervical / Trunk Assessment Cervical / Trunk Assessment: Normal   Communication Communication Communication: No difficulties   Cognition Arousal/Alertness: Awake/alert Behavior During Therapy: WFL for tasks assessed/performed Overall Cognitive Status: Within Functional Limits for tasks assessed                                                        Home Living Family/patient expects to be discharged to:: Private residence Living Arrangements: Spouse/significant other Available Help at Discharge: Family;Personal care attendant Type of Home: House Home Access: Ramped entrance     Home Layout: One level     Bathroom Shower/Tub: Teacher, early years/pre: Handicapped height     Home Equipment: Rollator (4 wheels);BSC/3in1;Shower seat;Wheelchair - manual          Prior  Functioning/Environment Prior Level of Function : Needs assist             Mobility Comments: Patient states household ambulation with rollator ADLs Comments: assisted by aid with all ADL, ait 5x/week for 8 hours/day        OT Problem List: Decreased strength;Decreased activity tolerance;Impaired balance (sitting and/or standing)      OT Treatment/Interventions: Self-care/ADL training;Therapeutic exercise;Therapeutic activities;Patient/family education;Balance training    OT Goals(Current goals can be found in the care plan section) Acute Rehab OT Goals Patient Stated Goal: Get stronger at a SNF. OT Goal Formulation: With patient Time For Goal Achievement: 05/23/22 Potential to Achieve Goals: Good  OT Frequency: Min 2X/week    Co-evaluation PT/OT/SLP Co-Evaluation/Treatment: Yes Reason for Co-Treatment: To address functional/ADL transfers PT goals addressed during session: Mobility/safety with mobility;Balance;Proper use of DME;Strengthening/ROM OT goals addressed during session: ADL's and self-care      AM-PAC OT "6 Clicks" Daily Activity     Outcome Measure Help from another person eating meals?: None Help from another person taking care of personal grooming?: A Little Help from another person toileting, which includes using toliet, bedpan, or urinal?: A Little Help from another person bathing (including washing, rinsing, drying)?: A Lot Help from another person to put on and taking off regular upper body clothing?: A Little Help  from another person to put on and taking off regular lower body clothing?: A Lot 6 Click Score: 17   End of Session Equipment Utilized During Treatment: Rolling walker (2 wheels)  Activity Tolerance: Patient tolerated treatment well Patient left: in bed;with call bell/phone within reach  OT Visit Diagnosis: Unsteadiness on feet (R26.81);Other abnormalities of gait and mobility (R26.89);Muscle weakness (generalized) (M62.81)                 Time: [id number] OT Time Calculation (min): 24 min Charges:  OT General Charges $OT Visit: 1 Visit OT Evaluation $OT Eval Low Complexity: 1 Low  Shaymus Eveleth OT, MOT   Larey Seat 05/09/2022, 10:02 AM

## 2022-05-09 NOTE — Progress Notes (Signed)
PROGRESS NOTE     [name], is a 72 y.o. female, DOB - 09/05/1950, [id number]  Admit date - 05/08/2022   Admitting Physician Ejiroghene Arlyce Dice, MD  Outpatient Primary MD for the patient is [name], [name], MD  LOS - 0  Chief Complaint  Patient presents with   Code Stroke        Brief Narrative:  72 y.o. female with medical history significant for diabetes mellitus, OSA, hypertension, diastolic CHF, coronary artery disease     -Assessment and Plan:  1)Abd Pain--Patient with lower abdominal area pain, subjective chills, also complains of back discomfort -UA and CT abdomen and pelvis requested  2)TIA (transient ischemic attack) -Left-sided weakness improved but not completely resolved according to patient - Head CT, CTA head and neck-unremarkable, negative for large vessel occlusion, shows moderate to severe stenosis of M2 MCA, P2 PCA, M1 MCA. - MRI Brain is negative ,  motion degraded, but no evidence of acute infarct, or acute cranial abnormality. -Code stroke called, patient not deemed to be TPA candidate, nonfocal exam. -Teleneurology recommends aspirin and Plavix, Atorvastatin 80 mg daily for 90 days, and then aspirin monotherapy after that allow for permissive hypertension. - ECHO  CAD (coronary artery disease) -No further chest pains Follows with Dr. Harl Bowie. PCI to LCx- 2007.  Nuclear stress 08/2020 no ischemia. -Troponin x 2 unremarkable -EKG unchanged. -Follow up Echo with preserved EF -Aspirin, Plavix, statins. -Restart Toprol-XL  Essential hypertension, benign Allow for permissive hypertension -Hold imdur, metoprolol, lisinopril  Chronic diastolic heart failure (HCC) Stable and Compensated.  Last echo 07/2020, EF of 55 to 60%, with grade 1 DD. - Resume home Lasix 60 mg daily in a.m.  Obstructive sleep apnea On 2 L of O2 at night and during the day as needed  Morbid Obesity- -Low calorie diet, portion control and increase physical activity  discussed with patient -Body mass index is 42 kg/m.  Left-sided weakness--physical therapy recommends SNF rehab --awaiting insurance authorization to go to SNF rehab  DM2-last A1c 8.3 reflecting uncontrolled diabetes with hyperglycemia PTA -Hold glipizide and metformin -Continue insulin regimen  Disposition: The patient is from: Home              Anticipated d/c is to: SNF              Anticipated d/c date is: 1 day              Patient currently is medically stable to d/c. Barriers: Not Clinically Stable-   Code Status :  -  Code Status: Full Code   Family Communication:    NA (patient is alert, awake and coherent)   DVT Prophylaxis  :   - SCDs   enoxaparin (LOVENOX) injection 40 mg Start: 05/08/22 2200   Lab Results  Component Value Date   PLT 348 05/08/2022    Inpatient Medications  Scheduled Meds:  aspirin EC  81 mg Oral Daily   atorvastatin  80 mg Oral Daily   buPROPion ER  100 mg Oral Daily   clopidogrel  75 mg Oral Daily   doxepin  75 mg Oral QHS   enoxaparin (LOVENOX) injection  40 mg Subcutaneous Q24H   furosemide  60 mg Oral Daily   gabapentin  600 mg Oral TID   insulin aspart  0-15 Units Subcutaneous TID WC   insulin aspart  0-5 Units Subcutaneous QHS   insulin aspart protamine- aspart  30 Units Subcutaneous BID WC   pantoprazole  40 mg  Oral BID AC   senna-docusate  2 tablet Oral QHS   Continuous Infusions: PRN Meds:.acetaminophen **OR** acetaminophen (TYLENOL) oral liquid 160 mg/5 mL **OR** acetaminophen, ALPRAZolam, oxyCODONE   Anti-infectives (From admission, onward)    None       Subjective: Madisin Preast today has no fevers, no emesis,  No further chest pain,    Patient with lower abdominal area pain, subjective chills, also complains of back discomfort -- Last BM about 24 hours ago  Objective: Vitals:   05/08/22 2001 05/08/22 2035 05/09/22 0453 05/09/22 1349  BP: (!) 187/78  (!) 162/83 (!) 153/73  Pulse: 83  (!) 101 (!) 108  Resp: '20   20 20  '$ Temp: 98.3 F (36.8 C)  98.3 F (36.8 C) 98.2 F (36.8 C)  TempSrc: Oral  Oral Oral  SpO2: 94% 94% 97% 97%  Weight:      Height: '5\' 4"'$  (1.626 m)       Intake/Output Summary (Last 24 hours) at 05/09/2022 1851 Last data filed at 05/09/2022 1819 Gross per 24 hour  Intake 720 ml  Output 1900 ml  Net -1180 ml   Filed Weights   05/08/22 1646 05/08/22 1957  Weight: 113.4 kg 111 kg    Physical Exam  Gen:- Awake Alert, no acute distress HEENT:- [place].AT, No sclera icterus Neck-Supple Neck,No JVD,.  Lungs-  CTAB , fair symmetrical air movement CV- S1, S2 normal, regular  Abd-  +ve B.Sounds, Abd Soft, no abdominal tenderness towards the middle,  voluntary guarding, no rebound , increased truncal adiposity extremity/Skin:- No  edema, pedal pulses present  Psych-affect is appropriate, oriented x3 Neuro-subtle left-sided weakness, no additional new focal deficits, no tremors  Data Reviewed: I have personally reviewed following labs and imaging studies  CBC: Recent Labs  Lab 05/08/22 1640  WBC 8.6  NEUTROABS 5.6  HGB 11.4*  HCT 36.7  MCV 84.6  PLT 0000000   Basic Metabolic Panel: Recent Labs  Lab 05/08/22 1640  NA 138  K 3.9  CL 98  CO2 30  GLUCOSE 201*  BUN 15  CREATININE 0.69  CALCIUM 8.3*   GFR: Estimated Creatinine Clearance: 80.9 mL/min (by C-G formula based on SCr of 0.69 mg/dL). Liver Function Tests: Recent Labs  Lab 05/08/22 1640  AST 21  ALT 14  ALKPHOS 62  BILITOT 0.7  PROT 6.0*  ALBUMIN 2.9*    HbA1C: Recent Labs    05/09/22 0759  HGBA1C 8.3*   Radiology Studies: ECHOCARDIOGRAM COMPLETE  Result Date: 05/09/2022    ECHOCARDIOGRAM REPORT   Patient Name:   [name] Date of Exam: 05/09/2022 Medical Rec #:  [id number]      Height:       64.0 in Accession #:    [id number]     Weight:       244.7 lb Date of Birth:  [birth year]     BSA:          2.132 m Patient Age:    39 years       BP:           162/83 mmHg Patient Gender: F              HR:            101 bpm. Exam Location:  [facility] Procedure: 2D Echo, Cardiac Doppler and Color Doppler Indications:    TIA G45.9  History:        Patient has prior history of Echocardiogram examinations, most  recent 08/08/2020. CHF, CAD and Previous Myocardial Infarction,                 TIA and COPD; Risk Factors:Hypertension, Diabetes and                 Dyslipidemia. Morbid obesity.  Sonographer:    Alvino Chapel RCS Referring Phys: [phone] Leanne Chang [facility]  Sonographer Comments: Technically difficult study due to poor echo windows. Patient c/o chest, left apical chest and abdomen as painfull. IMPRESSIONS  1. Left ventricular ejection fraction, by estimation, is 60 to 65%. The left ventricle has normal function. The left ventricle has no regional wall motion abnormalities. There is moderate concentric left ventricular hypertrophy. Left ventricular diastolic parameters are indeterminate.  2. Right ventricular systolic function is normal. The right ventricular size is normal. Tricuspid regurgitation signal is inadequate for assessing PA pressure.  3. The mitral valve is grossly normal. Trivial mitral valve regurgitation.  4. The aortic valve is tricuspid. There is mild calcification of the aortic valve. Aortic valve regurgitation is not visualized.  5. The inferior vena cava is normal in size with greater than 50% respiratory variability, suggesting right atrial pressure of 3 mmHg. Comparison(s): Prior images reviewed side by side. LVEF remains normal range at 60-65%. FINDINGS  Left Ventricle: Left ventricular ejection fraction, by estimation, is 60 to 65%. The left ventricle has normal function. The left ventricle has no regional wall motion abnormalities. Definity contrast agent was given IV to delineate the left ventricular  endocardial borders. The left ventricular internal cavity size was normal in size. There is moderate concentric left ventricular hypertrophy. Left ventricular diastolic  parameters are indeterminate. Right Ventricle: The right ventricular size is normal. No increase in right ventricular wall thickness. Right ventricular systolic function is normal. Tricuspid regurgitation signal is inadequate for assessing PA pressure. Left Atrium: Left atrial size was normal in size. Right Atrium: Right atrial size was normal in size. Pericardium: There is no evidence of pericardial effusion. Presence of epicardial fat layer. Mitral Valve: The mitral valve is grossly normal. Mild mitral annular calcification. Trivial mitral valve regurgitation. Tricuspid Valve: The tricuspid valve is grossly normal. Tricuspid valve regurgitation is trivial. Aortic Valve: The aortic valve is tricuspid. There is mild calcification of the aortic valve. Aortic valve regurgitation is not visualized. Pulmonic Valve: The pulmonic valve was grossly normal. Pulmonic valve regurgitation is not visualized. Aorta: The aortic root is normal in size and structure. Venous: The inferior vena cava is normal in size with greater than 50% respiratory variability, suggesting right atrial pressure of 3 mmHg. IAS/Shunts: No atrial level shunt detected by color flow Doppler.  LEFT VENTRICLE PLAX 2D LVIDd:         4.40 cm LVIDs:         2.90 cm LV PW:         1.30 cm LV IVS:        1.50 cm LVOT diam:     1.80 cm LV SV:         43 LV SV Index:   20 LVOT Area:     2.54 cm  RIGHT VENTRICLE RV S prime:     14.40 cm/s TAPSE (M-mode): 1.9 cm LEFT ATRIUM             Index        RIGHT ATRIUM           Index LA diam:        2.70 cm 1.27 cm/m  RA Area:     11.50 cm LA Vol (A2C):   38.6 ml 18.11 ml/m  RA Volume:   22.50 ml  10.56 ml/m LA Vol (A4C):   58.3 ml 27.35 ml/m LA Biplane Vol: 51.8 ml 24.30 ml/m  AORTIC VALVE LVOT Vmax:   110.00 cm/s LVOT Vmean:  73.700 cm/s LVOT VTI:    0.170 m  AORTA Ao Root diam: 3.20 cm MITRAL VALVE MV Area (PHT): 5.27 cm     SHUNTS MV Decel Time: 144 msec     Systemic VTI:  0.17 m MV E velocity: 110.00 cm/s   Systemic Diam: 1.80 cm Rozann Lesches MD Electronically signed by Rozann Lesches MD Signature Date/Time: 05/09/2022/2:47:48 PM    Final    MR Brain Wo Contrast (neuro protocol)  Result Date: 05/08/2022 CLINICAL DATA:  Provided history: Neuro deficit, acute, stroke suspected. EXAM: MRI HEAD WITHOUT CONTRAST TECHNIQUE: Multiplanar, multiecho pulse sequences of the brain and surrounding structures were obtained without intravenous contrast. COMPARISON:  Non-contrast head CT and CT angiogram head/neck performed earlier today 05/08/2022. Report from brain MRI 02/20/2009 (images currently unavailable). FINDINGS: Intermittently motion degraded examination. Most notably, the sagittal T1-weighted sequence is moderately motion degraded. Brain: Mild generalized parenchymal atrophy. Chronic cortical/subcortical infarct within right parietooccipital lobes. Mild chronic hemosiderin deposition within the infarction territory. Small chronic lacunar infarct within the left corona radiata. Mild-to-moderate multifocal T2 FLAIR hyperintense signal abnormality elsewhere within the cerebral white matter, nonspecific but compatible with chronic small vessel ischemic disease. Multiple T2 hyperintense foci within the bilateral deep gray nuclei, likely reflecting a combination of prominent perivascular spaces and chronic lacunar infarcts. Chronic lacunar infarcts within the pons. Background mild pontine chronic small vessel ischemic disease. There is no acute infarct. No evidence of an intracranial mass. No extra-axial fluid collection. No midline shift. Vascular: Maintained flow voids within the proximal large arterial vessels. Skull and upper cervical spine: No suspicious marrow lesion. Sinuses/Orbits: No mass or acute finding within the imaged orbits. Trace mucosal thickening within the left frontal, bilateral ethmoid and left maxillary sinuses. Other: Trace fluid within the bilateral mastoid air cells. IMPRESSION: 1. Intermittently  motion degraded examination. 2. No evidence of an acute intracranial abnormality. Specifically, the diffusion-weighted imaging is of good quality and there is no evidence of an acute infarct. 3. Parenchymal atrophy, chronic small vessel ischemic disease and chronic infarcts as described. Electronically Signed   By: Kellie Simmering D.O.   On: 05/08/2022 17:56   CT ANGIO HEAD NECK W WO CM  Result Date: 05/08/2022 CLINICAL DATA:  Stroke/TIA, determine embolic source EXAM: CT ANGIOGRAPHY HEAD AND NECK TECHNIQUE: Multidetector CT imaging of the head and neck was performed using the standard protocol during bolus administration of intravenous contrast. Multiplanar CT image reconstructions and MIPs were obtained to evaluate the vascular anatomy. Carotid stenosis measurements (when applicable) are obtained utilizing NASCET criteria, using the distal internal carotid diameter as the denominator. RADIATION DOSE REDUCTION: This exam was performed according to the departmental dose-optimization program which includes automated exposure control, adjustment of the mA and/or kV according to patient size and/or use of iterative reconstruction technique. CONTRAST:  73m OMNIPAQUE IOHEXOL 350 MG/ML SOLN COMPARISON:  None Available. FINDINGS: CTA NECK FINDINGS Aortic arch: Great vessel origins are patent without significant stenosis. Right carotid system: No evidence of dissection, stenosis (50% or greater), or occlusion. Left carotid system: No evidence of dissection, stenosis (50% or greater), or occlusion. Vertebral arteries: Left dominant. Both vertebral arteries are patent without hemodynamically significant stenosis. Skeleton: No  evidence of acute abnormality on limited assessment. Multilevel facet and uncovertebral hypertrophy with varying degrees of neural foraminal stenosis. Other neck: No evidence of acute abnormality on limited assessment. Upper chest: Biapical pleuroparenchymal scarring. Review of the MIP images confirms  the above findings CTA HEAD FINDINGS Anterior circulation: Bilateral intracranial ICAs are patent with mild-to-moderate narrowing due to calcific atherosclerosis. Moderate stenosis of the right M1 MCA. Severe stenosis of both proximal and more distal M2 MCA branches. A more distal M2 MCA branch stenosis is nearly occlusive. Severe stenosis of a distal left M2 MCA branch. Bilateral ACAs are patent without high-grade proximal stenosis. Posterior circulation: Bilateral intradural vertebral arteries and basilar artery are patent without significant stenosis. Bilateral PCAs are patent with multifocal severe right P2 PCA stenosis. Multifocal mild left P2 PCA stenosis. Venous sinuses: As permitted by contrast timing, patent. Review of the MIP images confirms the above findings IMPRESSION: 1. No emergent large vessel occlusion. 2. Severe bilateral M2 MCA branch stenosis. 3. Multifocal severe right P2 PCA stenosis. 4. Moderate right proximal M1 MCA stenosis. Findings discussed with Dr. Rory Percy via telephone at 4:35 p.m. Electronically Signed   By: Margaretha Sheffield M.D.   On: 05/08/2022 16:47   CT HEAD CODE STROKE WO CONTRAST  Result Date: 05/08/2022 CLINICAL DATA:  Code stroke. Neuro deficit, acute, stroke suspected. EXAM: CT HEAD WITHOUT CONTRAST TECHNIQUE: Contiguous axial images were obtained from the base of the skull through the vertex without intravenous contrast. RADIATION DOSE REDUCTION: This exam was performed according to the departmental dose-optimization program which includes automated exposure control, adjustment of the mA and/or kV according to patient size and/or use of iterative reconstruction technique. COMPARISON:  None Available. FINDINGS: Brain: Remote right occipital infarct. Remote perforator infarct in the left basal ganglia additional patchy white matter hypodensities, nonspecific but compatible with chronic microvascular ischemic disease. No evidence of acute large vascular territory infarct,  acute hemorrhage, mass lesion, midline shift or hydrocephalus. Vascular: No hyperdense vessel identified. Skull: Normal. Negative for fracture or focal lesion. Sinuses/Orbits: No acute finding. Other: None. ASPECTS [facility] Stroke Program Early CT Score) total score (0-10 with 10 being normal): 10. IMPRESSION: 1. No evidence of acute intracranial abnormality.  ASPECTS is 10. 2. Remote infarcts and chronic microvascular ischemic disease, as above. Code stroke imaging results were communicated on 05/08/2022 at 4:03 pm to provider [email or web address] via telephone, who verbally acknowledged these results. Electronically Signed   By: Margaretha Sheffield M.D.   On: 05/08/2022 16:04     Scheduled Meds:  aspirin EC  81 mg Oral Daily   atorvastatin  80 mg Oral Daily   buPROPion ER  100 mg Oral Daily   clopidogrel  75 mg Oral Daily   doxepin  75 mg Oral QHS   enoxaparin (LOVENOX) injection  40 mg Subcutaneous Q24H   furosemide  60 mg Oral Daily   gabapentin  600 mg Oral TID   insulin aspart  0-15 Units Subcutaneous TID WC   insulin aspart  0-5 Units Subcutaneous QHS   insulin aspart protamine- aspart  30 Units Subcutaneous BID WC   pantoprazole  40 mg Oral BID AC   senna-docusate  2 tablet Oral QHS   Continuous Infusions:   LOS: 0 days    Roxan Hockey M.D on 05/09/2022 at 6:51 PM  Go to [email or web address] - for contact info  [facility] - Office  [phone]  If 7PM-7AM, please contact night-coverage [email or web address] 05/09/2022, 6:51 PM

## 2022-05-09 NOTE — [place] FL2 (Signed)
[place] MEDICAID FL2 LEVEL OF CARE FORM     IDENTIFICATION  Patient Name: [name] Birthdate: 1950/10/20 Sex: female Admission Date (Current Location): 05/08/2022  [facility] and [place] Number:  [employer] and Address:  [facility] [address] [place]      Provider Number: [id number]  Attending Physician Name and Address:  Roxan Hockey, MD  Relative Name and Phone Number:  Alyese, Laroy) [phone]    Current Level of Care: Hospital Recommended Level of Care: [place] Prior Approval Number:    Date Approved/Denied:   PASRR Number: Pending  Discharge Plan: SNF    Current Diagnoses: Patient Active Problem List   Diagnosis Date Noted   TIA (transient ischemic attack) 05/08/2022   Unstable angina ([place]) 08/07/2020   Nephrolithiasis 10/10/2019   Vitamin D deficiency 07/08/2018   History of colonic polyps 01/06/2018   Family hx of colon cancer 01/06/2018   Uncontrolled type 2 diabetes mellitus with hyperglycemia ([place]) 10/07/2017   Primary osteoarthritis of left knee 05/15/2017   Primary osteoarthritis of right knee 05/15/2017   Non-cardiac chest pain 02/17/2017   Essential hypertension, benign 05/14/2015   Morbid obesity ([place]) 05/02/2015   Dysphagia 04/24/2015   Arthrosis of right acromioclavicular joint 11/13/2014   Spondylosis of lumbar region without myelopathy or radiculopathy 11/13/2014   Painful diabetic neuropathy ([place]) 06/26/2014   Diarrhea 03/02/2013   Rectal irritation 03/02/2013   Chronic low back pain without sciatica 07/29/2012   Muscle weakness (generalized) 07/29/2012   Spinal stenosis, lumbar region, without neurogenic claudication 07/19/2012   Irritable bowel syndrome 08/24/2011   Pancreatitis 10/17/2010   CAD (coronary artery disease)    Type 2 diabetes mellitus with vascular disease ([place])    Diabetic polyneuropathy ([place])    Anxiety and depression    Mixed hyperlipidemia  09/20/2009   Chronic diastolic heart failure ([place]) 09/20/2009   Obstructive sleep apnea 09/04/2009   TACHYCARDIA 11/08/2008   PALPITATIONS 11/08/2008   CHEST PAIN-UNSPECIFIED 11/08/2008    Orientation RESPIRATION BLADDER Height & Weight     Self, Time, Situation, Place  Normal Continent Weight: 111 kg Height:  '5\' 4"'$  (162.6 cm)  BEHAVIORAL SYMPTOMS/MOOD NEUROLOGICAL BOWEL NUTRITION STATUS      Continent Diet (See DC summary)  AMBULATORY STATUS COMMUNICATION OF NEEDS Skin   Extensive Assist Verbally Normal                       Personal Care Assistance Level of Assistance  Bathing, Feeding, Dressing Bathing Assistance: Maximum assistance Feeding assistance: Limited assistance Dressing Assistance: Maximum assistance     Functional Limitations Info  Sight, Hearing, Speech Sight Info: Impaired Hearing Info: Adequate Speech Info: Adequate    SPECIAL CARE FACTORS FREQUENCY  PT (By licensed PT)     PT Frequency: 5 times a week              Contractures Contractures Info: Not present    Additional Factors Info  Code Status Code Status Info: Full             Current Medications (05/09/2022):  This is the current hospital active medication list Current Facility-Administered Medications  Medication Dose Route Frequency Provider Last Rate Last Admin   acetaminophen (TYLENOL) tablet 650 mg  650 mg Oral Q4H PRN Emokpae, Ejiroghene E, MD   650 mg at 05/09/22 0853   Or   acetaminophen (TYLENOL) 160 MG/5ML solution 650 mg  650 mg Per Tube Q4H  PRN Bethena Roys, MD       Or   acetaminophen (TYLENOL) suppository 650 mg  650 mg Rectal Q4H PRN Emokpae, Ejiroghene E, MD       ALPRAZolam Duanne Moron) tablet 0.5 mg  0.5 mg Oral BID PRN Emokpae, Ejiroghene E, MD   0.5 mg at 05/08/22 2158   aspirin EC tablet 81 mg  81 mg Oral Daily Emokpae, Ejiroghene E, MD   81 mg at 05/09/22 0844   atorvastatin (LIPITOR) tablet 80 mg  80 mg Oral Daily Emokpae, Ejiroghene E, MD   80 mg at  05/09/22 0844   buPROPion ER (WELLBUTRIN SR) 12 hr tablet 100 mg  100 mg Oral Daily Emokpae, Ejiroghene E, MD   100 mg at 05/09/22 0845   clopidogrel (PLAVIX) tablet 75 mg  75 mg Oral Daily Emokpae, Ejiroghene E, MD   75 mg at 05/09/22 0845   doxepin (SINEQUAN) capsule 75 mg  75 mg Oral QHS Emokpae, Ejiroghene E, MD   75 mg at 05/08/22 2158   enoxaparin (LOVENOX) injection 40 mg  40 mg Subcutaneous Q24H Emokpae, Ejiroghene E, MD   40 mg at 05/08/22 2157   furosemide (LASIX) tablet 60 mg  60 mg Oral Daily Emokpae, Ejiroghene E, MD   60 mg at 05/09/22 0845   gabapentin (NEURONTIN) capsule 600 mg  600 mg Oral TID Emokpae, Ejiroghene E, MD   600 mg at 05/09/22 0845   insulin aspart (novoLOG) injection 0-15 Units  0-15 Units Subcutaneous TID WC Emokpae, Ejiroghene E, MD   5 Units at 05/09/22 1232   insulin aspart (novoLOG) injection 0-5 Units  0-5 Units Subcutaneous QHS Emokpae, Ejiroghene E, MD       insulin aspart protamine- aspart (NOVOLOG MIX 70/30) injection 30 Units  30 Units Subcutaneous BID WC Emokpae, Ejiroghene E, MD   30 Units at 05/09/22 0846   pantoprazole (PROTONIX) EC tablet 40 mg  40 mg Oral BID AC Emokpae, Ejiroghene E, MD   40 mg at 05/09/22 0844   senna-docusate (Senokot-S) tablet 1 tablet  1 tablet Oral QHS PRN Emokpae, Ejiroghene E, MD         Discharge Medications: Please see discharge summary for a list of discharge medications.  Relevant Imaging Results:  Relevant Lab Results:   Additional Information SS# [id number]  Boneta Lucks, RN

## 2022-05-09 NOTE — TOC Progression Note (Signed)
30 day  Note    Patient Details  Name: [name] MRN: [id number] Date of Birth: 1950-08-21  Transition of Care [facility]) CM/SW Contact  Boneta Lucks, RN Phone Number: 05/09/2022, 1:12 PM                          To whom it May Concern: Please be advised that the above name patient will require a short-term nursing home stay- anticipated 30 days or less rehabilitation and strengthening. The plan is for return home.

## 2022-05-09 NOTE — Plan of Care (Signed)
  Problem: Acute Rehab PT Goals(only PT should resolve) Goal: Patient Will Transfer Sit To/From Stand Outcome: Progressing Flowsheets (Taken 05/09/2022 0919) Patient will transfer sit to/from stand:  with minimal assist  with min guard assist Goal: Pt Will Transfer Bed To Chair/Chair To Bed Outcome: Progressing Flowsheets (Taken 05/09/2022 0919) Pt will Transfer Bed to Chair/Chair to Bed:  min guard assist  with min assist Goal: Pt Will Ambulate Outcome: Progressing Flowsheets (Taken 05/09/2022 0919) Pt will Ambulate:  50 feet  with min guard assist  with minimal assist  with rolling walker Goal: Pt/caregiver will Perform Home Exercise Program Outcome: Progressing Flowsheets (Taken 05/09/2022 0919) Pt/caregiver will Perform Home Exercise Program:  For increased strengthening  For improved balance  Independently  9:20 AM, 05/09/22 Mearl Latin PT, DPT Physical Therapist at [facility]

## 2022-05-10 DIAGNOSIS — M6281 Muscle weakness (generalized): Secondary | ICD-10-CM | POA: Diagnosis not present

## 2022-05-10 DIAGNOSIS — Z86718 Personal history of other venous thrombosis and embolism: Secondary | ICD-10-CM | POA: Diagnosis not present

## 2022-05-10 DIAGNOSIS — G4733 Obstructive sleep apnea (adult) (pediatric): Secondary | ICD-10-CM | POA: Diagnosis not present

## 2022-05-10 DIAGNOSIS — E1142 Type 2 diabetes mellitus with diabetic polyneuropathy: Secondary | ICD-10-CM | POA: Diagnosis present

## 2022-05-10 DIAGNOSIS — R279 Unspecified lack of coordination: Secondary | ICD-10-CM | POA: Diagnosis not present

## 2022-05-10 DIAGNOSIS — Z6841 Body Mass Index (BMI) 40.0 and over, adult: Secondary | ICD-10-CM | POA: Diagnosis not present

## 2022-05-10 DIAGNOSIS — Z8249 Family history of ischemic heart disease and other diseases of the circulatory system: Secondary | ICD-10-CM | POA: Diagnosis not present

## 2022-05-10 DIAGNOSIS — I110 Hypertensive heart disease with heart failure: Secondary | ICD-10-CM | POA: Diagnosis present

## 2022-05-10 DIAGNOSIS — D649 Anemia, unspecified: Secondary | ICD-10-CM | POA: Diagnosis not present

## 2022-05-10 DIAGNOSIS — Z955 Presence of coronary angioplasty implant and graft: Secondary | ICD-10-CM | POA: Diagnosis not present

## 2022-05-10 DIAGNOSIS — E7800 Pure hypercholesterolemia, unspecified: Secondary | ICD-10-CM | POA: Diagnosis present

## 2022-05-10 DIAGNOSIS — G8192 Hemiplegia, unspecified affecting left dominant side: Secondary | ICD-10-CM | POA: Diagnosis not present

## 2022-05-10 DIAGNOSIS — I252 Old myocardial infarction: Secondary | ICD-10-CM | POA: Diagnosis not present

## 2022-05-10 DIAGNOSIS — E1165 Type 2 diabetes mellitus with hyperglycemia: Secondary | ICD-10-CM | POA: Diagnosis not present

## 2022-05-10 DIAGNOSIS — R531 Weakness: Secondary | ICD-10-CM | POA: Diagnosis present

## 2022-05-10 DIAGNOSIS — I2510 Atherosclerotic heart disease of native coronary artery without angina pectoris: Secondary | ICD-10-CM | POA: Diagnosis not present

## 2022-05-10 DIAGNOSIS — R4781 Slurred speech: Secondary | ICD-10-CM | POA: Diagnosis present

## 2022-05-10 DIAGNOSIS — E785 Hyperlipidemia, unspecified: Secondary | ICD-10-CM | POA: Diagnosis not present

## 2022-05-10 DIAGNOSIS — I5032 Chronic diastolic (congestive) heart failure: Secondary | ICD-10-CM | POA: Diagnosis not present

## 2022-05-10 DIAGNOSIS — G459 Transient cerebral ischemic attack, unspecified: Secondary | ICD-10-CM | POA: Diagnosis not present

## 2022-05-10 DIAGNOSIS — R29810 Facial weakness: Secondary | ICD-10-CM | POA: Diagnosis present

## 2022-05-10 DIAGNOSIS — L97512 Non-pressure chronic ulcer of other part of right foot with fat layer exposed: Secondary | ICD-10-CM | POA: Diagnosis not present

## 2022-05-10 DIAGNOSIS — G8929 Other chronic pain: Secondary | ICD-10-CM | POA: Diagnosis not present

## 2022-05-10 DIAGNOSIS — L97522 Non-pressure chronic ulcer of other part of left foot with fat layer exposed: Secondary | ICD-10-CM | POA: Diagnosis not present

## 2022-05-10 DIAGNOSIS — K219 Gastro-esophageal reflux disease without esophagitis: Secondary | ICD-10-CM | POA: Diagnosis not present

## 2022-05-10 DIAGNOSIS — J449 Chronic obstructive pulmonary disease, unspecified: Secondary | ICD-10-CM | POA: Diagnosis present

## 2022-05-10 DIAGNOSIS — I4891 Unspecified atrial fibrillation: Secondary | ICD-10-CM | POA: Diagnosis present

## 2022-05-10 DIAGNOSIS — B9620 Unspecified Escherichia coli [E. coli] as the cause of diseases classified elsewhere: Secondary | ICD-10-CM | POA: Diagnosis present

## 2022-05-10 DIAGNOSIS — F32A Depression, unspecified: Secondary | ICD-10-CM | POA: Diagnosis present

## 2022-05-10 DIAGNOSIS — E11621 Type 2 diabetes mellitus with foot ulcer: Secondary | ICD-10-CM | POA: Diagnosis not present

## 2022-05-10 DIAGNOSIS — E1140 Type 2 diabetes mellitus with diabetic neuropathy, unspecified: Secondary | ICD-10-CM | POA: Diagnosis not present

## 2022-05-10 DIAGNOSIS — R1314 Dysphagia, pharyngoesophageal phase: Secondary | ICD-10-CM | POA: Diagnosis not present

## 2022-05-10 DIAGNOSIS — I672 Cerebral atherosclerosis: Secondary | ICD-10-CM | POA: Diagnosis present

## 2022-05-10 DIAGNOSIS — E559 Vitamin D deficiency, unspecified: Secondary | ICD-10-CM | POA: Diagnosis not present

## 2022-05-10 DIAGNOSIS — Z794 Long term (current) use of insulin: Secondary | ICD-10-CM | POA: Diagnosis not present

## 2022-05-10 DIAGNOSIS — N390 Urinary tract infection, site not specified: Secondary | ICD-10-CM | POA: Diagnosis not present

## 2022-05-10 DIAGNOSIS — Z79899 Other long term (current) drug therapy: Secondary | ICD-10-CM | POA: Diagnosis not present

## 2022-05-10 LAB — GLUCOSE, CAPILLARY
Glucose-Capillary: 211 mg/dL — ABNORMAL HIGH (ref 70–99)
Glucose-Capillary: 287 mg/dL — ABNORMAL HIGH (ref 70–99)
Glucose-Capillary: 293 mg/dL — ABNORMAL HIGH (ref 70–99)
Glucose-Capillary: 297 mg/dL — ABNORMAL HIGH (ref 70–99)

## 2022-05-10 NOTE — Progress Notes (Signed)
Patient complained on lower abdomen/pelvic area pain that was was not eased with medication. Raised bed and gave pillows to splint the area and it gave relief enough to sleep. She was able to stand at bedside and was transported by wheelchair to CT scan. Tolerated well.

## 2022-05-10 NOTE — Progress Notes (Signed)
PROGRESS NOTE     [name], is a 72 y.o. female, DOB - Dec 25, 1950, [id number]  Admit date - 05/08/2022   Admitting Physician Emily Banh Denton Brick, MD  Outpatient Primary MD for the patient is [name], [name], MD  LOS - 0  Chief Complaint  Patient presents with   Code Stroke        Brief Narrative:  72 y.o. female with medical history significant for diabetes mellitus, OSA, hypertension, diastolic CHF, coronary artery disease     -Assessment and Plan:  1)Abd Pain--Patient with lower abdominal area pain, subjective chills, also complains of back discomfort -UA and CT abdomen and pelvis consistent with UTI without Pyelo -IV Rocephin as ordered pending culture data  2)TIA (transient ischemic attack) -Left-sided weakness improved but not completely resolved according to patient - Head CT, CTA head and neck-unremarkable, negative for large vessel occlusion, Hopkins moderate to severe stenosis of M2 MCA, P2 PCA, M1 MCA. - MRI Brain is negative , motion degraded, but no evidence of acute infarct, or acute cranial abnormality. -Code stroke called, patient Not deemed to be TPA candidate, nonfocal exam. -Teleneurology recommends aspirin and Plavix, Atorvastatin 80 mg daily for 90 days, and then aspirin monotherapy after that for secondary stroke Prevention (Per The multicenter SAMMPRIS trial) - ECHO with EF of 60 to 65 %.. With moderate LVH  CAD (coronary artery disease) -No further chest pains Follows with Dr. Harl Hopkins. PCI to LCx- 2007.  Nuclear stress 08/2020 no ischemia. -Troponin x 2 unremarkable -EKG unchanged. -Follow up Echo with preserved EF -Aspirin, Plavix, statins as above -Restart Toprol-XL  Essential hypertension, benign Allow for permissive hypertension -Hold imdur,, lisinopril -Restart Metoprolol  Chronic diastolic heart failure (HCC) Stable and Compensated.  Last echo 07/2020, EF of 55 to 60%, with grade 1 DD. - Resume home Lasix 60 mg daily in  a.m.  Obstructive sleep apnea On 2 L of O2 at night and during the day as needed  Morbid Obesity- -Low calorie diet, portion control and increase physical activity discussed with patient -Body mass index is 42 kg/m.  Left-sided weakness--physical therapy recommends SNF rehab --awaiting insurance authorization to go to SNF rehab  DM2-last A1c 8.3 reflecting uncontrolled diabetes with hyperglycemia PTA -Hold glipizide and metformin -Continue insulin regimen  Wounds-- R great toe and then L top posterior to great toe patient states bunion removal in January by Dr [name] --- -wounds  looks dry with no drainage  Disposition: The patient is from: Home              Anticipated d/c is to: SNF              Anticipated d/c date is: 1 day              Patient currently is medically stable to d/c. Barriers: Not Clinically Stable-   Code Status :  -  Code Status: Full Code   Family Communication:    NA (patient is alert, awake and coherent)   DVT Prophylaxis  :   - SCDs   enoxaparin (LOVENOX) injection 40 mg Start: 05/08/22 2200   Lab Results  Component Value Date   PLT 348 05/08/2022    Inpatient Medications  Scheduled Meds:  aspirin EC  81 mg Oral Daily   atorvastatin  80 mg Oral Daily   buPROPion ER  100 mg Oral Daily   clopidogrel  75 mg Oral Daily   doxepin  75 mg Oral QHS   enoxaparin (LOVENOX) injection  40 mg Subcutaneous Q24H   furosemide  60 mg Oral Daily   gabapentin  600 mg Oral TID   insulin aspart  0-15 Units Subcutaneous TID WC   insulin aspart  0-5 Units Subcutaneous QHS   insulin aspart protamine- aspart  30 Units Subcutaneous BID WC   metoprolol succinate  100 mg Oral Daily   pantoprazole  40 mg Oral BID AC   senna-docusate  2 tablet Oral QHS   Continuous Infusions:  cefTRIAXone (ROCEPHIN)  IV 1 g (05/10/22 0109)   PRN Meds:.acetaminophen **OR** acetaminophen (TYLENOL) oral liquid 160 mg/5 mL **OR** acetaminophen, ALPRAZolam,  oxyCODONE   Anti-infectives (From admission, onward)    Start     Dose/Rate Route Frequency Ordered Stop   05/09/22 2330  cefTRIAXone (ROCEPHIN) 1 g in sodium chloride 0.9 % 100 mL IVPB        1 g 200 mL/hr over 30 Minutes Intravenous Every 24 hours 05/09/22 2237         Subjective: [name] today has no fevers, no emesis,  No further chest pain,    -Dysuria and abdominal pain improving  Objective: Vitals:   05/09/22 1349 05/09/22 2017 05/10/22 0540 05/10/22 0829  BP: (!) 153/73 138/72 (!) 137/58 (!) 149/55  Pulse: (!) 108 (!) 107 87 87  Resp: 20 20 (!) 22 20  Temp: 98.2 F (36.8 C) 97.9 F (36.6 C) 99.5 F (37.5 C) 99 F (37.2 C)  TempSrc: Oral Oral Oral Oral  SpO2: 97% 96% 100% 97%  Weight:      Height:        Intake/Output Summary (Last 24 hours) at 05/10/2022 1725 Last data filed at 05/10/2022 1545 Gross per 24 hour  Intake 580 ml  Output 1500 ml  Net -920 ml   Filed Weights   05/08/22 1646 05/08/22 1957  Weight: 113.4 kg 111 kg    Physical Exam  Gen:- Awake Alert, no acute distress HEENT:- [place]rise.AT, No sclera icterus Neck-Supple Neck,No JVD,.  Lungs-  CTAB , fair symmetrical air movement CV- S1, S2 normal, regular  Abd-  +ve B.Sounds, Abd Soft,  increased truncal adiposity, no CVA area tenderness, suprapubic area abdominal tenderness improving extremity/Skin:-pedal pulses present, R great toe and then L top posterior to great toe patient states bunion removal in January by Dr [name] --- -wounds  looks dry with no drainage  Psych-affect is appropriate, oriented x3 Neuro-subtle left-sided weakness, no additional new focal deficits, no tremors  Data Reviewed: I have personally reviewed following labs and imaging studies  CBC: Recent Labs  Lab 05/08/22 1640  WBC 8.6  NEUTROABS 5.6  HGB 11.4*  HCT 36.7  MCV 84.6  PLT 0000000   Basic Metabolic Panel: Recent Labs  Lab 05/08/22 1640  NA 138  K 3.9  CL 98  CO2 30  GLUCOSE 201*  BUN 15   CREATININE 0.69  CALCIUM 8.3*   GFR: Estimated Creatinine Clearance: 80.9 mL/min (by C-G formula based on SCr of 0.69 mg/dL). Liver Function Tests: Recent Labs  Lab 05/08/22 1640  AST 21  ALT 14  ALKPHOS 62  BILITOT 0.7  PROT 6.0*  ALBUMIN 2.9*    HbA1C: Recent Labs    05/09/22 0759  HGBA1C 8.3*   Radiology Studies: CT ABDOMEN PELVIS W CONTRAST  Result Date: 05/09/2022 CLINICAL DATA:  Abdominal vein. EXAM: CT ABDOMEN AND PELVIS WITH CONTRAST TECHNIQUE: Multidetector CT imaging of the abdomen and pelvis was performed using the standard protocol following bolus administration of intravenous contrast.  RADIATION DOSE REDUCTION: This exam was performed according to the departmental dose-optimization program which includes automated exposure control, adjustment of the mA and/or kV according to patient size and/or use of iterative reconstruction technique. CONTRAST:  182m OMNIPAQUE IOHEXOL 300 MG/ML  SOLN COMPARISON:  CT without contrast 10/10/2019. FINDINGS: Lower chest: Lung bases show mild subpleural reticulation without infiltrates. The cardiac size is normal. There is scattered three-vessel calcific CAD. Hepatobiliary: Surgically absent gallbladder, as before. No biliary dilatation. Enlarged liver 22.5 cm in length with moderate steatosis. No mass. No portal vein dilatation. Pancreas: No abnormality. Spleen: No mass. No splenomegaly. 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 1.2 cm calcified splenic artery aneurysm at the hilum of the spleen. Adrenals/Urinary Tract: Stable 2 cm left adrenal nodule presumably an adenoma. No right adrenal mass. The bilateral kidneys enhance homogeneously. No or urinary obstruction noted. There is mild thickening in the bladder with perivesical stranding compatible with cystitis. Mild bladder distention without wall mass. Stomach/Bowel: No dilatation or wall thickening including the appendix. Moderate retained stool ascending colon. Uncomplicated sigmoid diverticulosis.  Vascular/Lymphatic: Aortic atherosclerosis. No enlarged abdominal or pelvic lymph nodes. Reproductive: Status post hysterectomy. No adnexal masses. Other: Right-sided laxity and outward protrusion of the abdominal wall. No incarcerated hernia. Minimal pelvic ascites adjacent the bladder. No free air or free hemorrhage. No abscess. Musculoskeletal: Chronic findings of osteonecrosis over portions of the left femoral head. No articular surface collapse. Mild hip DJD. Degenerative changes thoracic and lumbar spine. IMPRESSION: 1. CT findings consistent with cystitis. 2. Constipation and diverticulosis. 3. Moderate hepatic steatosis. 4. Aortic and coronary artery atherosclerosis. 5. Right-sided laxity and outward protrusion of the abdominal wall. No incarcerated hernia. 6. 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 7. Stable 2 cm left adrenal nodule presumably an adenoma. 8. 1.2 cm calcified splenic artery aneurysm at the splenic hilum. Aortic Atherosclerosis (ICD10-I70.0). Electronically Signed   By: KTelford NabM.D.   On: 05/09/2022 21:24   ECHOCARDIOGRAM COMPLETE  Result Date: 05/09/2022    ECHOCARDIOGRAM REPORT   Patient Name:   ARUDELL NAUMANNDate of Exam: 05/09/2022 Medical Rec #:  [id number]     Height:       64.0 in Accession #:    [id number]    Weight:       244.7 lb Date of Birth:  [birth year]    BSA:          2.132 m Patient Age:    89years       BP:           162/83 mmHg Patient Gender: F              HR:           101 bpm. [facility]am [facility]  [facility]cedure: 2D Echo, Cardiac Doppler and Color Doppler Indications:    TIA G45.9  History:        Patient has prior history of Echocardiogram examinations, most                 recent 08/08/2020. CHF, CAD and Previous Myocardial Infarction,                 TIA and COPD; Risk Factors:Hypertension, Diabetes and                 Dyslipidemia. Morbid obesity.  Sonographer:    BAlvino ChapelRCS Referring Phys: [phone] ChangEVa [facility]  Sonographer Comments: Technically difficult study due to poor echo windows.  Patient c/o chest, left apical chest and abdomen as painfull. IMPRESSIONS  1. Left ventricular ejection fraction, by estimation, is 60 to 65%. The left ventricle has normal function. The left ventricle has no regional wall motion abnormalities. There is moderate concentric left ventricular hypertrophy. Left ventricular diastolic parameters are indeterminate.  2. Right ventricular systolic function is normal. The right ventricular size is normal. Tricuspid regurgitation signal is inadequate for assessing PA pressure.  3. The mitral valve is grossly normal. Trivial mitral valve regurgitation.  4. The aortic valve is tricuspid. There is mild calcification of the aortic valve. Aortic valve regurgitation is not visualized.  5. The inferior vena cava is normal in size with greater than 50% respiratory variability, suggesting right atrial pressure of 3 mmHg. Comparison(s): Prior images reviewed side by side. LVEF remains normal range at 60-65%. FINDINGS  Left Ventricle: Left ventricular ejection fraction, by estimation, is 60 to 65%. The left ventricle has normal function. The left ventricle has no regional wall motion abnormalities. Definity contrast agent was given IV to delineate the left ventricular  endocardial borders. The left ventricular internal cavity size was normal in size. There is moderate concentric left ventricular hypertrophy. Left ventricular diastolic parameters are indeterminate. Right Ventricle: The right ventricular size is normal. No increase in right ventricular wall thickness. Right ventricular systolic function is normal. Tricuspid regurgitation signal is inadequate for assessing PA pressure. Left Atrium: Left atrial size was normal in size. Right Atrium: Right atrial size was normal in size. Pericardium: There is no evidence of pericardial effusion. Presence of epicardial fat layer. Mitral Valve: The mitral valve is  grossly normal. Mild mitral annular calcification. Trivial mitral valve regurgitation. Tricuspid Valve: The tricuspid valve is grossly normal. Tricuspid valve regurgitation is trivial. Aortic Valve: The aortic valve is tricuspid. There is mild calcification of the aortic valve. Aortic valve regurgitation is not visualized. Pulmonic Valve: The pulmonic valve was grossly normal. Pulmonic valve regurgitation is not visualized. Aorta: The aortic root is normal in size and structure. Venous: The inferior vena cava is normal in size with greater than 50% respiratory variability, suggesting right atrial pressure of 3 mmHg. IAS/Shunts: No atrial level shunt detected by color flow Doppler.  LEFT VENTRICLE PLAX 2D LVIDd:         4.40 cm LVIDs:         2.90 cm LV PW:         1.30 cm LV IVS:        1.50 cm LVOT diam:     1.80 cm LV SV:         43 LV SV Index:   20 LVOT Area:     2.54 cm  RIGHT VENTRICLE RV S prime:     14.40 cm/s TAPSE (M-mode): 1.9 cm LEFT ATRIUM             Index        RIGHT ATRIUM           Index LA diam:        2.70 cm 1.27 cm/m   RA Area:     11.50 cm LA Vol (A2C):   38.6 ml 18.11 ml/m  RA Volume:   22.50 ml  10.56 ml/m LA Vol (A4C):   58.3 ml 27.35 ml/m LA Biplane Vol: 51.8 ml 24.30 ml/m  AORTIC VALVE LVOT Vmax:   110.00 cm/s LVOT Vmean:  73.700 cm/s LVOT VTI:    0.170 m  AORTA Ao Root diam: 3.20 cm MITRAL VALVE MV  Area (PHT): 5.27 cm     SHUNTS MV Decel Time: 144 msec     Systemic VTI:  0.17 m MV E velocity: 110.00 cm/s  Systemic Diam: 1.80 cm Rozann Lesches MD Electronically signed by Rozann Lesches MD Signature Date/Time: 05/09/2022/2:47:48 PM    Final    MR Brain Wo Contrast (neuro protocol)  Result Date: 05/08/2022 CLINICAL DATA:  Provided history: Neuro deficit, acute, stroke suspected. EXAM: MRI HEAD WITHOUT CONTRAST TECHNIQUE: Multiplanar, multiecho pulse sequences of the brain and surrounding structures were obtained without intravenous contrast. COMPARISON:  Non-contrast head CT  and CT angiogram head/neck performed earlier today 05/08/2022. Report from brain MRI 02/20/2009 (images currently unavailable). FINDINGS: Intermittently motion degraded examination. Most notably, the sagittal T1-weighted sequence is moderately motion degraded. Brain: Mild generalized parenchymal atrophy. Chronic cortical/subcortical infarct within right parietooccipital lobes. Mild chronic hemosiderin deposition within the infarction territory. Small chronic lacunar infarct within the left corona radiata. Mild-to-moderate multifocal T2 FLAIR hyperintense signal abnormality elsewhere within the cerebral white matter, nonspecific but compatible with chronic small vessel ischemic disease. Multiple T2 hyperintense foci within the bilateral deep gray nuclei, likely reflecting a combination of prominent perivascular spaces and chronic lacunar infarcts. Chronic lacunar infarcts within the pons. Background mild pontine chronic small vessel ischemic disease. There is no acute infarct. No evidence of an intracranial mass. No extra-axial fluid collection. No midline shift. Vascular: Maintained flow voids within the proximal large arterial vessels. Skull and upper cervical spine: No suspicious marrow lesion. Sinuses/Orbits: No mass or acute finding within the imaged orbits. Trace mucosal thickening within the left frontal, bilateral ethmoid and left maxillary sinuses. Other: Trace fluid within the bilateral mastoid air cells. IMPRESSION: 1. Intermittently motion degraded examination. 2. No evidence of an acute intracranial abnormality. Specifically, the diffusion-weighted imaging is of good quality and there is no evidence of an acute infarct. 3. Parenchymal atrophy, chronic small vessel ischemic disease and chronic infarcts as described. Electronically Signed   By: Kellie Simmering D.O.   On: 05/08/2022 17:56     Scheduled Meds:  aspirin EC  81 mg Oral Daily   atorvastatin  80 mg Oral Daily   buPROPion ER  100 mg Oral Daily    clopidogrel  75 mg Oral Daily   doxepin  75 mg Oral QHS   enoxaparin (LOVENOX) injection  40 mg Subcutaneous Q24H   furosemide  60 mg Oral Daily   gabapentin  600 mg Oral TID   insulin aspart  0-15 Units Subcutaneous TID WC   insulin aspart  0-5 Units Subcutaneous QHS   insulin aspart protamine- aspart  30 Units Subcutaneous BID WC   metoprolol succinate  100 mg Oral Daily   pantoprazole  40 mg Oral BID AC   senna-docusate  2 tablet Oral QHS   Continuous Infusions:  cefTRIAXone (ROCEPHIN)  IV 1 g (05/10/22 0109)     LOS: 0 days    Roxan Hockey M.D on 05/10/2022 at 5:25 PM  Go to [email or web address] - for contact info  [facility] - Office  [phone]  If 7PM-7AM, please contact night-coverage [email or web address] 05/10/2022, 5:25 PM

## 2022-05-11 LAB — GLUCOSE, CAPILLARY
Glucose-Capillary: 249 mg/dL — ABNORMAL HIGH (ref 70–99)
Glucose-Capillary: 354 mg/dL — ABNORMAL HIGH (ref 70–99)
Glucose-Capillary: 268 mg/dL — ABNORMAL HIGH (ref 70–99)
Glucose-Capillary: 188 mg/dL — ABNORMAL HIGH (ref 70–99)

## 2022-05-11 MED ORDER — ISOSORBIDE MONONITRATE ER 60 MG PO TB24
30.0000 mg | ORAL_TABLET | Freq: Every day | ORAL | Status: DC
  Administered 2022-05-11 – 2022-05-12 (×2): 30 mg via ORAL
  Filled 2022-05-11 (×2): qty 1

## 2022-05-11 MED ORDER — HYDRALAZINE HCL 20 MG/ML IJ SOLN: 10.0000 mg | Freq: Four times a day (QID) | INTRAMUSCULAR | Status: DC | PRN

## 2022-05-11 NOTE — Progress Notes (Signed)
PROGRESS NOTE     [name], is a 72 y.o. female, DOB - [birth year], [id number]  Admit date - 05/08/2022   Admitting Physician Gicela Schwarting Denton Brick, MD  Outpatient Primary MD for the patient is [name], [name], MD  LOS - 1  Chief Complaint  Patient presents with   Code Stroke        Brief Narrative:  72 y.o. female with medical history significant for diabetes mellitus, OSA, hypertension, diastolic CHF, coronary artery disease     -Assessment and Plan:  1) E. coli UTI ---  -UA and CT abdomen and pelvis consistent with UTI without Pyelo -c/n IV Rocephin as ordered pending sensitivity of E. Coli  2)TIA (transient ischemic attack) -Left-sided weakness improved but not completely resolved according to patient - Head CT, CTA head and neck-unremarkable, negative for large vessel occlusion, shows moderate to severe stenosis of M2 MCA, P2 PCA, M1 MCA. - MRI Brain is negative , motion degraded, but no evidence of acute infarct, or acute cranial abnormality. -Code stroke called, patient Not deemed to be TPA candidate, nonfocal exam. -Teleneurology recommends aspirin and Plavix, Atorvastatin 80 mg daily for 90 days, and then aspirin monotherapy after that for secondary stroke Prevention (Per The multicenter SAMMPRIS trial) - ECHO with EF of 60 to 65 %.. With moderate LVH  CAD (coronary artery disease) -No further chest pains Follows with Dr. Harl Bowie. PCI to LCx- 2007.  Nuclear stress 08/2020 no ischemia. -Troponin x 2 unremarkable -EKG unchanged. -Follow up Echo with preserved EF -Aspirin, Plavix, statins as above -Restart Toprol-XL  Essential hypertension, benign Allow for permissive hypertension -Hold imdur,, lisinopril -Restart Metoprolol  Chronic diastolic heart failure (HCC) Stable and Compensated.  Last echo 07/2020, EF of 55 to 60%, with grade 1 DD. - Resume home Lasix 60 mg daily in a.m.  Obstructive sleep apnea On 2 L of O2 at night and during the day as  needed  Morbid Obesity- -Low calorie diet, portion control and increase physical activity discussed with patient -Body mass index is 42 kg/m.  Left-sided weakness--physical therapy recommends SNF rehab --awaiting insurance authorization to go to SNF rehab  DM2-last A1c 8.3 reflecting uncontrolled diabetes with hyperglycemia PTA -Hold glipizide and metformin -Continue insulin regimen  Wounds-- R great toe and then L top posterior to great toe patient states bunion removal in January by Dr Irving Shows --- -wounds  looks dry with no drainage  Disposition: The patient is from: Home              Anticipated d/c is to: SNF--- anticipate discharge to SNF on 05/12/2022              Anticipated d/c date is: 1 day              Patient currently is medically stable to d/c. Barriers: Not Clinically Stable-   Code Status :  -  Code Status: Full Code   Family Communication:    NA (patient is alert, awake and coherent)   DVT Prophylaxis  :   - SCDs   enoxaparin (LOVENOX) injection 40 mg Start: 05/08/22 2200  Lab Results  Component Value Date   PLT 348 05/08/2022   Inpatient Medications  Scheduled Meds:  aspirin EC  81 mg Oral Daily   atorvastatin  80 mg Oral Daily   buPROPion ER  100 mg Oral Daily   clopidogrel  75 mg Oral Daily   doxepin  75 mg Oral QHS   enoxaparin (LOVENOX) injection  40  mg Subcutaneous Q24H   furosemide  60 mg Oral Daily   gabapentin  600 mg Oral TID   insulin aspart  0-15 Units Subcutaneous TID WC   insulin aspart  0-5 Units Subcutaneous QHS   insulin aspart protamine- aspart  30 Units Subcutaneous BID WC   isosorbide mononitrate  30 mg Oral Daily   metoprolol succinate  100 mg Oral Daily   pantoprazole  40 mg Oral BID AC   senna-docusate  2 tablet Oral QHS   Continuous Infusions:  cefTRIAXone (ROCEPHIN)  IV 1 g (05/10/22 2230)   PRN Meds:.acetaminophen **OR** acetaminophen (TYLENOL) oral liquid 160 mg/5 mL **OR** acetaminophen, ALPRAZolam, hydrALAZINE,  oxyCODONE   Anti-infectives (From admission, onward)    Start     Dose/Rate Route Frequency Ordered Stop   05/09/22 2330  cefTRIAXone (ROCEPHIN) 1 g in sodium chloride 0.9 % 100 mL IVPB        1 g 200 mL/hr over 30 Minutes Intravenous Every 24 hours 05/09/22 2237         Subjective: [name] today has no fevers, no emesis,  No further chest pain,    -Dysuria and abdominal pain improving -No back pain -Oral intake is fair  Objective: Vitals:   05/10/22 2120 05/11/22 0525 05/11/22 1020 05/11/22 1256  BP: (!) 167/70 (!) 163/93 (!) 168/67 (!) 152/76  Pulse: 86 80 88 83  Resp: '19 18 20 18  '$ Temp: 97.8 F (36.6 C) 97.6 F (36.4 C) 97.7 F (36.5 C) 98.4 F (36.9 C)  TempSrc: Oral Axillary Oral   SpO2: 95% 95% 96% 94%  Weight:      Height:        Intake/Output Summary (Last 24 hours) at 05/11/2022 1338 Last data filed at 05/11/2022 0526 Gross per 24 hour  Intake 580 ml  Output 2100 ml  Net -1520 ml   Filed Weights   05/08/22 1646 05/08/22 1957  Weight: 113.4 kg 111 kg    Physical Exam  Gen:- Awake Alert, no acute distress HEENT:- [place].AT, No sclera icterus Neck-Supple Neck,No JVD,.  Lungs-  CTAB , fair symmetrical air movement CV- S1, S2 normal, regular  Abd-  +ve B.Sounds, Abd Soft,  increased truncal adiposity, no CVA area tenderness, suprapubic area abdominal tenderness improving extremity/Skin:-pedal pulses present, R great toe and then L top posterior to great toe patient states bunion removal in January by Dr Irving Shows --- -wounds  looks dry with no drainage  Psych-affect is appropriate, oriented x3 Neuro-no significant focal weakness at this time patient has generalized weakness , no tremors  Data Reviewed: I have personally reviewed following labs and imaging studies  CBC: Recent Labs  Lab 05/08/22 1640  WBC 8.6  NEUTROABS 5.6  HGB 11.4*  HCT 36.7  MCV 84.6  PLT 0000000   Basic Metabolic Panel: Recent Labs  Lab 05/08/22 1640  NA 138  K 3.9  CL 98   CO2 30  GLUCOSE 201*  BUN 15  CREATININE 0.69  CALCIUM 8.3*   GFR: Estimated Creatinine Clearance: 80.9 mL/min (by C-G formula based on SCr of 0.69 mg/dL). Liver Function Tests: Recent Labs  Lab 05/08/22 1640  AST 21  ALT 14  ALKPHOS 62  BILITOT 0.7  PROT 6.0*  ALBUMIN 2.9*    HbA1C: Recent Labs    05/09/22 0759  HGBA1C 8.3*   Radiology Studies: CT ABDOMEN PELVIS W CONTRAST  Result Date: 05/09/2022 CLINICAL DATA:  Abdominal vein. EXAM: CT ABDOMEN AND PELVIS WITH CONTRAST TECHNIQUE: Multidetector CT  imaging of the abdomen and pelvis was performed using the standard protocol following bolus administration of intravenous contrast. RADIATION DOSE REDUCTION: This exam was performed according to the departmental dose-optimization program which includes automated exposure control, adjustment of the mA and/or kV according to patient size and/or use of iterative reconstruction technique. CONTRAST:  167m OMNIPAQUE IOHEXOL 300 MG/ML  SOLN COMPARISON:  CT without contrast 10/10/2019. FINDINGS: Lower chest: Lung bases show mild subpleural reticulation without infiltrates. The cardiac size is normal. There is scattered three-vessel calcific CAD. Hepatobiliary: Surgically absent gallbladder, as before. No biliary dilatation. Enlarged liver 22.5 cm in length with moderate steatosis. No mass. No portal vein dilatation. Pancreas: No abnormality. Spleen: No mass. No splenomegaly. 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 1.2 cm calcified splenic artery aneurysm at the hilum of the spleen. Adrenals/Urinary Tract: Stable 2 cm left adrenal nodule presumably an adenoma. No right adrenal mass. The bilateral kidneys enhance homogeneously. No or urinary obstruction noted. There is mild thickening in the bladder with perivesical stranding compatible with cystitis. Mild bladder distention without wall mass. Stomach/Bowel: No dilatation or wall thickening including the appendix. Moderate retained stool ascending colon.  Uncomplicated sigmoid diverticulosis. Vascular/Lymphatic: Aortic atherosclerosis. No enlarged abdominal or pelvic lymph nodes. Reproductive: Status post hysterectomy. No adnexal masses. Other: Right-sided laxity and outward protrusion of the abdominal wall. No incarcerated hernia. Minimal pelvic ascites adjacent the bladder. No free air or free hemorrhage. No abscess. Musculoskeletal: Chronic findings of osteonecrosis over portions of the left femoral head. No articular surface collapse. Mild hip DJD. Degenerative changes thoracic and lumbar spine. IMPRESSION: 1. CT findings consistent with cystitis. 2. Constipation and diverticulosis. 3. Moderate hepatic steatosis. 4. Aortic and coronary artery atherosclerosis. 5. Right-sided laxity and outward protrusion of the abdominal wall. No incarcerated hernia. 6. 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 7. Stable 2 cm left adrenal nodule presumably an adenoma. 8. 1.2 cm calcified splenic artery aneurysm at the splenic hilum. Aortic Atherosclerosis (ICD10-I70.0). Electronically Signed   By: KTelford NabM.D.   On: 05/09/2022 21:24   ECHOCARDIOGRAM COMPLETE  Result Date: 05/09/2022    ECHOCARDIOGRAM REPORT   Patient Name:   Emily LEGRODate of Exam: 05/09/2022 Medical Rec #:  [id number]     Height:       64.0 in Accession #:    [id number]    Weight:       244.7 lb Date of Birth:  104-19-1952    BSA:          2.132 m Patient Age:    89years       BP:           162/83 mmHg Patient Gender: F              HR:           101 bpm. [facility]  [facility]: 2D Echo, Cardiac Doppler and Color Doppler Indications:    TIA G45.9  History:        Patient has prior history of Echocardiogram examinations, most                 recent 08/08/2020. CHF, CAD and Previous Myocardial Infarction,                 TIA and COPD; Risk Factors:Hypertension, Diabetes and                 Dyslipidemia. Morbid obesity.  Sonographer:    BAlvino ChapelRCS  Referring  Phys: [id number] [place]  Sonographer Comments: Technically difficult study due to poor echo windows. Patient c/o chest, left apical chest and abdomen as painfull. IMPRESSIONS  1. Left ventricular ejection fraction, by estimation, is 60 to 65%. The left ventricle has normal function. The left ventricle has no regional wall motion abnormalities. There is moderate concentric left ventricular hypertrophy. Left ventricular diastolic parameters are indeterminate.  2. Right ventricular systolic function is normal. The right ventricular size is normal. Tricuspid regurgitation signal is inadequate for assessing PA pressure.  3. The mitral valve is grossly normal. Trivial mitral valve regurgitation.  4. The aortic valve is tricuspid. There is mild calcification of the aortic valve. Aortic valve regurgitation is not visualized.  5. The inferior vena cava is normal in size with greater than 50% respiratory variability, suggesting right atrial pressure of 3 mmHg. Comparison(s): Prior images reviewed side by side. LVEF remains normal range at 60-65%. FINDINGS  Left Ventricle: Left ventricular ejection fraction, by estimation, is 60 to 65%. The left ventricle has normal function. The left ventricle has no regional wall motion abnormalities. Definity contrast agent was given IV to delineate the left ventricular  endocardial borders. The left ventricular internal cavity size was normal in size. There is moderate concentric left ventricular hypertrophy. Left ventricular diastolic parameters are indeterminate. Right Ventricle: The right ventricular size is normal. No increase in right ventricular wall thickness. Right ventricular systolic function is normal. Tricuspid regurgitation signal is inadequate for assessing PA pressure. Left Atrium: Left atrial size was normal in size. Right Atrium: Right atrial size was normal in size. Pericardium: There is no evidence of pericardial effusion. Presence of epicardial fat layer. Mitral  Valve: The mitral valve is grossly normal. Mild mitral annular calcification. Trivial mitral valve regurgitation. Tricuspid Valve: The tricuspid valve is grossly normal. Tricuspid valve regurgitation is trivial. Aortic Valve: The aortic valve is tricuspid. There is mild calcification of the aortic valve. Aortic valve regurgitation is not visualized. Pulmonic Valve: The pulmonic valve was grossly normal. Pulmonic valve regurgitation is not visualized. Aorta: The aortic root is normal in size and structure. Venous: The inferior vena cava is normal in size with greater than 50% respiratory variability, suggesting right atrial pressure of 3 mmHg. IAS/Shunts: No atrial level shunt detected by color flow Doppler.  LEFT VENTRICLE PLAX 2D LVIDd:         4.40 cm LVIDs:         2.90 cm LV PW:         1.30 cm LV IVS:        1.50 cm LVOT diam:     1.80 cm LV SV:         43 LV SV Index:   20 LVOT Area:     2.54 cm  RIGHT VENTRICLE RV S prime:     14.40 cm/s TAPSE (M-mode): 1.9 cm LEFT ATRIUM             Index        RIGHT ATRIUM           Index LA diam:        2.70 cm 1.27 cm/m   RA Area:     11.50 cm LA Vol (A2C):   38.6 ml 18.11 ml/m  RA Volume:   22.50 ml  10.56 ml/m LA Vol (A4C):   58.3 ml 27.35 ml/m LA Biplane Vol: 51.8 ml 24.30 ml/m  AORTIC VALVE LVOT Vmax:   110.00 cm/s LVOT Vmean:  73.700  cm/s LVOT VTI:    0.170 m  AORTA Ao Root diam: 3.20 cm MITRAL VALVE MV Area (PHT): 5.27 cm     SHUNTS MV Decel Time: 144 msec     Systemic VTI:  0.17 m MV E velocity: 110.00 cm/s  Systemic Diam: 1.80 cm Rozann Lesches MD Electronically signed by Rozann Lesches MD Signature Date/Time: 05/09/2022/2:47:48 PM    Final     Scheduled Meds:  aspirin EC  81 mg Oral Daily   atorvastatin  80 mg Oral Daily   buPROPion ER  100 mg Oral Daily   clopidogrel  75 mg Oral Daily   doxepin  75 mg Oral QHS   enoxaparin (LOVENOX) injection  40 mg Subcutaneous Q24H   furosemide  60 mg Oral Daily   gabapentin  600 mg Oral TID   insulin  aspart  0-15 Units Subcutaneous TID WC   insulin aspart  0-5 Units Subcutaneous QHS   insulin aspart protamine- aspart  30 Units Subcutaneous BID WC   isosorbide mononitrate  30 mg Oral Daily   metoprolol succinate  100 mg Oral Daily   pantoprazole  40 mg Oral BID AC   senna-docusate  2 tablet Oral QHS   Continuous Infusions:  cefTRIAXone (ROCEPHIN)  IV 1 g (05/10/22 2230)    LOS: 1 day   Roxan Hockey M.D on 05/11/2022 at 1:38 PM  Go to [email or web address] - for contact info  [facility] - Office  [phone]  If 7PM-7AM, please contact night-coverage [email or web address] 05/11/2022, 1:38 PM

## 2022-05-12 DIAGNOSIS — M6281 Muscle weakness (generalized): Secondary | ICD-10-CM | POA: Diagnosis not present

## 2022-05-12 DIAGNOSIS — I5032 Chronic diastolic (congestive) heart failure: Secondary | ICD-10-CM | POA: Diagnosis not present

## 2022-05-12 DIAGNOSIS — E1341 Other specified diabetes mellitus with diabetic mononeuropathy: Secondary | ICD-10-CM | POA: Diagnosis not present

## 2022-05-12 DIAGNOSIS — Z794 Long term (current) use of insulin: Secondary | ICD-10-CM | POA: Diagnosis not present

## 2022-05-12 DIAGNOSIS — R4701 Aphasia: Secondary | ICD-10-CM | POA: Diagnosis not present

## 2022-05-12 DIAGNOSIS — E11621 Type 2 diabetes mellitus with foot ulcer: Secondary | ICD-10-CM | POA: Diagnosis not present

## 2022-05-12 DIAGNOSIS — N390 Urinary tract infection, site not specified: Secondary | ICD-10-CM | POA: Diagnosis not present

## 2022-05-12 DIAGNOSIS — E785 Hyperlipidemia, unspecified: Secondary | ICD-10-CM | POA: Diagnosis not present

## 2022-05-12 DIAGNOSIS — L97512 Non-pressure chronic ulcer of other part of right foot with fat layer exposed: Secondary | ICD-10-CM | POA: Diagnosis not present

## 2022-05-12 DIAGNOSIS — R279 Unspecified lack of coordination: Secondary | ICD-10-CM | POA: Diagnosis not present

## 2022-05-12 DIAGNOSIS — G8192 Hemiplegia, unspecified affecting left dominant side: Secondary | ICD-10-CM | POA: Diagnosis not present

## 2022-05-12 DIAGNOSIS — L97522 Non-pressure chronic ulcer of other part of left foot with fat layer exposed: Secondary | ICD-10-CM | POA: Diagnosis not present

## 2022-05-12 DIAGNOSIS — I10 Essential (primary) hypertension: Secondary | ICD-10-CM | POA: Diagnosis not present

## 2022-05-12 DIAGNOSIS — G8929 Other chronic pain: Secondary | ICD-10-CM | POA: Diagnosis not present

## 2022-05-12 DIAGNOSIS — I69354 Hemiplegia and hemiparesis following cerebral infarction affecting left non-dominant side: Secondary | ICD-10-CM | POA: Diagnosis not present

## 2022-05-12 DIAGNOSIS — K219 Gastro-esophageal reflux disease without esophagitis: Secondary | ICD-10-CM | POA: Diagnosis not present

## 2022-05-12 DIAGNOSIS — E119 Type 2 diabetes mellitus without complications: Secondary | ICD-10-CM | POA: Diagnosis not present

## 2022-05-12 DIAGNOSIS — G4733 Obstructive sleep apnea (adult) (pediatric): Secondary | ICD-10-CM | POA: Diagnosis not present

## 2022-05-12 DIAGNOSIS — I2510 Atherosclerotic heart disease of native coronary artery without angina pectoris: Secondary | ICD-10-CM | POA: Diagnosis not present

## 2022-05-12 DIAGNOSIS — E1140 Type 2 diabetes mellitus with diabetic neuropathy, unspecified: Secondary | ICD-10-CM | POA: Diagnosis not present

## 2022-05-12 DIAGNOSIS — E559 Vitamin D deficiency, unspecified: Secondary | ICD-10-CM | POA: Diagnosis not present

## 2022-05-12 DIAGNOSIS — G459 Transient cerebral ischemic attack, unspecified: Secondary | ICD-10-CM | POA: Diagnosis not present

## 2022-05-12 DIAGNOSIS — D649 Anemia, unspecified: Secondary | ICD-10-CM | POA: Diagnosis not present

## 2022-05-12 DIAGNOSIS — E1165 Type 2 diabetes mellitus with hyperglycemia: Secondary | ICD-10-CM | POA: Diagnosis not present

## 2022-05-12 DIAGNOSIS — R5381 Other malaise: Secondary | ICD-10-CM | POA: Diagnosis not present

## 2022-05-12 DIAGNOSIS — R1314 Dysphagia, pharyngoesophageal phase: Secondary | ICD-10-CM | POA: Diagnosis not present

## 2022-05-12 DIAGNOSIS — K3184 Gastroparesis: Secondary | ICD-10-CM | POA: Diagnosis not present

## 2022-05-12 LAB — BASIC METABOLIC PANEL
Anion gap: 11 (ref 5–15)
BUN: 15 mg/dL (ref 8–23)
CO2: 32 mmol/L (ref 22–32)
Calcium: 8.5 mg/dL — ABNORMAL LOW (ref 8.9–10.3)
Chloride: 98 mmol/L (ref 98–111)
Creatinine, Ser: 0.78 mg/dL (ref 0.44–1.00)
GFR, Estimated: >60 mL/min (ref >60)
Glucose, Bld: 243 mg/dL — ABNORMAL HIGH (ref 70–99)
Potassium: 3.7 mmol/L (ref 3.5–5.1)
Sodium: 141 mmol/L (ref 135–145)

## 2022-05-12 LAB — GLUCOSE, CAPILLARY
Glucose-Capillary: 253 mg/dL — ABNORMAL HIGH (ref 70–99)
Glucose-Capillary: 312 mg/dL — ABNORMAL HIGH (ref 70–99)

## 2022-05-12 LAB — URINE CULTURE: Culture: >=100000 — AB

## 2022-05-12 LAB — CBC
HCT: 40.3 % (ref 36.0–46.0)
Hemoglobin: 12 g/dL (ref 12.0–15.0)
MCH: 25.9 pg — ABNORMAL LOW (ref 26.0–34.0)
MCHC: 29.8 g/dL — ABNORMAL LOW (ref 30.0–36.0)
MCV: 86.9 fL (ref 80.0–100.0)
Platelets: 323 10*3/uL (ref 150–400)
RBC: 4.64 MIL/uL (ref 3.87–5.11)
RDW: 16.4 % — ABNORMAL HIGH (ref 11.5–15.5)
WBC: 7.8 10*3/uL (ref 4.0–10.5)
nRBC: 0 % (ref 0.0–0.2)

## 2022-05-12 MED ORDER — BISACODYL 10 MG RE SUPP
10.0000 mg | Freq: Once | RECTAL | Status: DC
  Filled 2022-05-12: qty 1

## 2022-05-12 MED ORDER — POLYETHYLENE GLYCOL 3350 17 G PO PACK
17.0000 g | PACK | Freq: Once | ORAL | Status: AC
  Administered 2022-05-12: 17 g via ORAL
  Filled 2022-05-12: qty 1

## 2022-05-12 MED ORDER — METHOCARBAMOL 500 MG PO TABS: 500.0000 mg | ORAL_TABLET | Freq: Two times a day (BID) | ORAL | 3 refills | Status: DC

## 2022-05-12 MED ORDER — SENNOSIDES-DOCUSATE SODIUM 8.6-50 MG PO TABS: 2.0000 | ORAL_TABLET | Freq: Every day | ORAL | 2 refills | Status: AC

## 2022-05-12 MED ORDER — CEPHALEXIN 500 MG PO CAPS: 500.0000 mg | ORAL_CAPSULE | Freq: Three times a day (TID) | ORAL | 0 refills | Status: AC

## 2022-05-12 MED ORDER — BUPROPION HCL ER (SR) 100 MG PO TB12: 100.0000 mg | ORAL_TABLET | Freq: Every day | ORAL | 2 refills | Status: AC

## 2022-05-12 MED ORDER — OXYCODONE-ACETAMINOPHEN 10-325 MG PO TABS: 1.0000 | ORAL_TABLET | Freq: Four times a day (QID) | ORAL | 0 refills | Status: DC | PRN

## 2022-05-12 MED ORDER — INSULIN LISPRO PROT & LISPRO (75-25 MIX) 100 UNIT/ML KWIKPEN: 90.0000 [IU] | PEN_INJECTOR | Freq: Two times a day (BID) | SUBCUTANEOUS | 2 refills | Status: AC

## 2022-05-12 MED ORDER — INSULIN ASPART PROT & ASPART (70-30 MIX) 100 UNIT/ML ~~LOC~~ SUSP
40.0000 [IU] | Freq: Two times a day (BID) | SUBCUTANEOUS | Status: DC
  Filled 2022-05-12: qty 10

## 2022-05-12 MED ORDER — CLOPIDOGREL BISULFATE 75 MG PO TABS: 75.0000 mg | ORAL_TABLET | Freq: Every day | ORAL | 0 refills | Status: DC

## 2022-05-12 MED ORDER — METFORMIN HCL 1000 MG PO TABS: 1000.0000 mg | ORAL_TABLET | Freq: Two times a day (BID) | ORAL | 3 refills | Status: AC

## 2022-05-12 MED ORDER — ALPRAZOLAM 0.5 MG PO TABS: 0.5000 mg | ORAL_TABLET | Freq: Two times a day (BID) | ORAL | 0 refills | Status: AC | PRN

## 2022-05-12 MED ORDER — ATORVASTATIN CALCIUM 80 MG PO TABS: 40.0000 mg | ORAL_TABLET | Freq: Every day | ORAL | 2 refills | Status: DC

## 2022-05-12 MED ORDER — ACETAMINOPHEN 325 MG PO TABS: 650.0000 mg | ORAL_TABLET | ORAL | 4 refills | Status: AC | PRN

## 2022-05-12 MED ORDER — ASPIRIN EC 81 MG PO TBEC: 81.0000 mg | DELAYED_RELEASE_TABLET | Freq: Every day | ORAL | 11 refills | Status: AC

## 2022-05-12 NOTE — Discharge Summary (Signed)
[name], is a 72 y.o. female  DOB [birth year]  MRN [id number].  Admission date:  05/08/2022  Admitting Physician  Roxan Hockey, MD  Discharge Date:  05/12/2022   Primary MD  Glenda Chroman, MD  Recommendations for primary care physician for things to follow:   1)-Take Aspirin 81 mg daily along with Plavix 75 mg daily for 21 days then after that STOP the Plavix  and continue ONLY Aspirin 81 mg daily indefinitely-- 2)Avoid ibuprofen/Advil/Aleve/Motrin/Goody Powders/Naproxen/BC powders/Meloxicam/Diclofenac/Indomethacin and other Nonsteroidal anti-inflammatory medications as these will make you more likely to bleed and can cause stomach ulcers, can also cause Kidney problems. 3)Dry gauze dressing to right and left bunion areas--change as needed  Admission Diagnosis  TIA (transient ischemic attack) [G45.9]   Discharge Diagnosis  TIA (transient ischemic attack) [G45.9]    Principal Problem:   TIA (transient ischemic attack) Active Problems:   CAD (coronary artery disease)   Obstructive sleep apnea   Chronic diastolic heart failure (HCC)   Type 2 diabetes mellitus with vascular disease ([place])   Essential hypertension, benign      Past Medical History:  Diagnosis Date   Anxiety and depression    Arthritis    CAD (coronary artery disease)    Stent placement circumflex coronary 2007, catheterization 2008 patent stents. 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    High cholesterol    Hx of blood clots    Hypertension    MI (myocardial  infarction) ([place])    2007   Obstructive sleep apnea    CPAP, setting ?2   Palpitations    Sleep apnea    Tachycardia     Past Surgical History:  Procedure Laterality Date   ABDOMINAL HYSTERECTOMY     Arm Surgery Bilateral    due to fall-pt broke both forearms   BIOPSY N/A 07/01/2013   Procedure: BIOPSY;  Surgeon: Rogene Houston, MD;  Location: [facility];  Service: Endoscopy;  Laterality: N/A;   CHOLECYSTECTOMY     COLONOSCOPY  08/06/2011   Procedure: COLONOSCOPY;  Surgeon: Rogene Houston, MD;  [facility];  Service: Endoscopy;  Laterality: N/A;  215   COLONOSCOPY WITH PROPOFOL N/A 12/31/2012   Procedure: COLONOSCOPY WITH PROPOFOL;  Surgeon: Rogene Houston, MD;  Location: [facility];  Service: Endoscopy;  Laterality: N/A;  in cecum at 0814, total withdrawal time 32mn   COLONOSCOPY WITH PROPOFOL N/A 02/05/2018   Procedure: COLONOSCOPY WITH PROPOFOL;  Surgeon: RRogene Houston MD;  [facility];  Service: Endoscopy;  Laterality: N/A;  1:25   CORONARY ANGIOPLASTY WITH STENT PLACEMENT     CYSTOSCOPY W/ URETERAL STENT PLACEMENT Right 11/10/2019   at [place], URETEROSCOPY AND STENT PLACEMENT Right 12/22/2019   Procedure: CYSTOSCOPY WITH RIGHT URETERAL STENT REMOVAL; RIGHT RETROGRADE PYELOGRAM,RIGHT URETEROSCOPY;  Surgeon: Cleon Gustin, MD;  Location: [facility];  Service: Urology;  Laterality: Right;   ESOPHAGEAL DILATION N/A 06/07/2015   Procedure: ESOPHAGEAL DILATION;  Surgeon: Rogene Houston, MD;  [facility];  Service: Endoscopy;  Laterality: N/A;   ESOPHAGOGASTRODUODENOSCOPY N/A 06/07/2015   Procedure: ESOPHAGOGASTRODUODENOSCOPY (EGD);  Surgeon: Rogene Houston, MD;  [facility];  Service: Endoscopy;  Laterality: N/A;  2:45 - moved to 1:00 - Ann to notify pt   ESOPHAGOGASTRODUODENOSCOPY (EGD) WITH PROPOFOL N/A 12/31/2012   Procedure: ESOPHAGOGASTRODUODENOSCOPY (EGD) WITH PROPOFOL;  Surgeon: Rogene Houston,  MD;  [facility];  Service: Endoscopy;  Laterality: N/A;  GE junction 37,   ESOPHAGOGASTRODUODENOSCOPY (EGD) WITH PROPOFOL N/A 07/01/2013   Procedure: ESOPHAGOGASTRODUODENOSCOPY (EGD) WITH PROPOFOL;  Surgeon: Rogene Houston, MD;  Location: [facility];  Service: Endoscopy;  Laterality: N/A;  Central N/A 12/31/2012   Procedure: MALONEY DILATION;  Surgeon: Rogene Houston, MD;  Location: [facility];  Service: Endoscopy;  Laterality: N/A;  used a # 54,#56   MALONEY DILATION N/A 07/01/2013   Procedure: MALONEY DILATION;  Surgeon: Rogene Houston, MD;  Location: [facility];  Service: Endoscopy;  Laterality: N/A;  54/58; no heme present   POLYPECTOMY  02/05/2018   Procedure: POLYPECTOMY;  Surgeon: Rogene Houston, MD;  [facility];  Service: Endoscopy;;  distal rectum (HS)   STONE EXTRACTION WITH BASKET Right 12/22/2019   Procedure: STONE EXTRACTION WITH BASKET;  Surgeon: Cleon Gustin, MD;  Location: [facility];  Service: Urology;  Laterality: Right;     HPI  from the history and physical done on the day of admission:    Chief Complaint: Slurred speech   HPI: [name] is a 72 y.o. female with medical history significant for diabetes mellitus, OSA, hypertension, diastolic CHF, coronary artery disease. Patient presented to the ED with complaints of numbness and weakness of her left upper and lower extremity.  She reports slurred speech, and family reported left facial droop.  She was last known well at 2:50 PM.  At the time of my evaluation, she tells me her speech has improved but is not completely back to baseline.  She was transiently on Eliquis 2021 for atrial fibrillation in setting of UTI with sepsis at [facility].  She is no longer on anticoagulation.   Now she also reports chest pain today started while she was in the ED resting.  Chronic chest pains, unrelated to activity, left-sided, radiating down her left arm.  She reports a history of heart attack.   ED  Course: Blood pressure systolic 123456 to 123456.   Code stroke called. Head CT shows remote infarct and chronic microvascular disease. CTA head and neck no large vessel occlusion. Evaluated by teleneurologist, patient noted tPA candidate. MRI brain-intermittently motion degraded, no evidence of acute intracranial abnormality, no evidence of acute infarct.   Review of Systems: As per HPI all other systems reviewed and negative.      Hospital Course:     Brief Narrative:  72 y.o. female with medical history significant for diabetes mellitus, OSA, hypertension, diastolic CHF, coronary artery disease      -Assessment  and Plan:   1) E. coli UTI ---  -UA and CT abdomen and pelvis consistent with UTI without Pyelo -treated with IV Rocephin, okay to discharge on p.o. Keflex   2)TIA (transient ischemic attack) -Left-sided weakness improved but not completely resolved according to patient - Head CT, CTA head and neck-unremarkable, negative for large vessel occlusion, shows moderate to severe stenosis of M2 MCA, P2 PCA, M1 MCA. - MRI Brain is negative , motion degraded, but no evidence of acute infarct, or acute cranial abnormality. -Code stroke called, patient Not deemed to be TPA candidate, nonfocal exam. -Teleneurology recommends aspirin and Plavix, Atorvastatin 80 mg daily for 90 days, and then aspirin monotherapy after that for secondary stroke Prevention (Per The multicenter SAMMPRIS trial) - ECHO with EF of 60 to 65 %.. With moderate LVH -Discharge to SNF rehab   CAD (coronary artery disease) -No further chest pains Follows with Dr. Harl Bowie. PCI to LCx- 2007.  Nuclear stress 08/2020 no ischemia. -Troponin x 2 unremarkable -EKG unchanged. -Follow up Echo with preserved EF -Aspirin, Plavix, atorvastatin as above -Restart Toprol-XL   Essential hypertension, benign Allowed for permissive hypertension -c/n  imdur,, lisinopril -c/n  Metoprolol   Chronic diastolic heart failure  (HCC) Stable and Compensated.  Last echo 07/2020, EF of 55 to 60%, with grade 1 DD. - c/n home Lasix 60 mg daily in a.m.   Obstructive sleep apnea On 2 L of O2 at night and during the day as needed   Morbid Obesity- -Low calorie diet, portion control and increase physical activity discussed with patient -Body mass index is 42 kg/m.   Left-sided weakness--physical therapy recommends SNF rehab -dc to  SNF rehab   DM2-last A1c 8.3 reflecting uncontrolled diabetes with hyperglycemia PTA -stop glipizide  -c/n  metformin -Continue insulin regimen   Wounds-- R great toe and then L top posterior to great toe patient states bunion removal in January by Dr Irving Shows --- -wounds  looks dry with no drainage -Protective dry gauze dressing over the bunion areas advised   Disposition: The patient is from: Home              Anticipated d/c is to: SNF--- anticipate discharge to SNF on 05/12/2022        Discharge Condition: stable  Follow UP   Contact information for after-discharge care     Destination     [facility] .   Service: Skilled Nursing Contact information: [address] [place] [phone]                     Consults obtained -neurology  Diet and Activity recommendation:  As advised  Discharge Instructions    Discharge Instructions     Ambulatory referral to Neurology   Complete by: As directed    An appointment is requested in approximately: 4-6 weeks   Call MD for:  difficulty breathing, headache or visual disturbances   Complete by: As directed    Call MD for:  persistant dizziness or light-headedness   Complete by: As directed    Call MD for:  persistant nausea and vomiting   Complete by: As directed    Call MD for:  temperature >100.4   Complete by: As directed    Change dressing (specify)   Complete by: As directed    Dry gauze dressing to right and left bunion areas--change as needed   Diet -  low sodium heart healthy   Complete  by: As directed    Diet Carb Modified   Complete by: As directed    Discharge instructions   Complete by: As directed    1)-Take Aspirin 81 mg daily along with Plavix 75 mg daily for 21 days then after that STOP the Plavix  and continue ONLY Aspirin 81 mg daily indefinitely-- 2)Avoid ibuprofen/Advil/Aleve/Motrin/Goody Powders/Naproxen/BC powders/Meloxicam/Diclofenac/Indomethacin and other Nonsteroidal anti-inflammatory medications as these will make you more likely to bleed and can cause stomach ulcers, can also cause Kidney problems. 3)Dry gauze dressing to right and left bunion areas--change as needed   Increase activity slowly   Complete by: As directed        Discharge Medications     Allergies as of 05/12/2022       Reactions   Amphetamine-dextroamphetamine Swelling   Nitrofuran Derivatives Itching, Swelling   Amphetamine-dextroamphet Er Swelling   Pregabalin Swelling   Topiramate Other (See Comments)   Tongue tingle   Verelan [verapamil] Rash        Medication List     STOP taking these medications    Accu-Chek Guide test strip Generic drug: glucose blood   glipiZIDE 5 MG 24 hr tablet Commonly known as: GLUCOTROL XL   potassium chloride 10 MEQ tablet Commonly known as: KLOR-CON       TAKE these medications    Accu-Chek Guide Me w/Device Kit 1 Piece by Does not apply route as directed.   acetaminophen 325 MG tablet Commonly known as: TYLENOL Take 2 tablets (650 mg total) by mouth every 4 (four) hours as needed for mild pain (or temp > 37.5 C (99.5 F)).   ALPRAZolam 0.5 MG tablet Commonly known as: XANAX Take 1 tablet (0.5 mg total) by mouth 2 (two) times daily as needed for anxiety or sleep.   aspirin EC 81 MG tablet Take 1 tablet (81 mg total) by mouth daily with breakfast. -Take Aspirin 81 mg daily along with Plavix 75 mg daily for 21 days then after that STOP the Plavix  and continue ONLY Aspirin 81 mg daily  indefinitely-- What changed:  when to take this additional instructions   atorvastatin 80 MG tablet Commonly known as: LIPITOR Take 0.5 tablets (40 mg total) by mouth daily. What changed: medication strength   B-D ULTRAFINE III SHORT PEN 31G X 8 MM Misc Generic drug: Insulin Pen Needle Inject into the skin 4 (four) times daily.   buPROPion ER 100 MG 12 hr tablet Commonly known as: WELLBUTRIN SR Take 1 tablet (100 mg total) by mouth daily.   cephALEXin 500 MG capsule Commonly known as: Keflex Take 1 capsule (500 mg total) by mouth 3 (three) times daily for 3 days.   clopidogrel 75 MG tablet Commonly known as: PLAVIX Take 1 tablet (75 mg total) by mouth daily. -Take Aspirin 81 mg daily along with Plavix 75 mg daily for 21 days then after that STOP the Plavix  and continue ONLY Aspirin 81 mg daily indefinitely-- Start taking on: May 13, 2022   diclofenac Sodium 1 % Gel Commonly known as: VOLTAREN Apply topically.   doxepin 25 MG capsule Commonly known as: SINEQUAN Take 75 mg by mouth at bedtime.   ferrous sulfate 325 (65 FE) MG EC tablet Take by mouth.   furosemide 40 MG tablet Commonly known as: LASIX Take 1.5 tablets (60 mg total) by mouth daily.   gabapentin 400 MG capsule Commonly known as: NEURONTIN Take 600 mg by mouth 3 (three) times daily.   Insulin Lispro Prot &  Lispro (75-25) 100 UNIT/ML Kwikpen Commonly known as: HUMALOG 75/25 MIX Inject 90 Units into the skin 2 (two) times daily with a meal. What changed: how much to take   isosorbide mononitrate 30 MG 24 hr tablet Commonly known as: IMDUR TAKE 1/2 TABLET BY MOUTH DAILY (Need TO make APPOINTMENT FOR NEXT refills) What changed: See the new instructions.   lidocaine 5 % ointment Commonly known as: XYLOCAINE Apply topically 2 (two) times daily.   lisinopril 5 MG tablet Commonly known as: ZESTRIL TAKE 1 TABLET BY MOUTH DAILY (Make APPOINTMENT FOR NEXT refills) What changed: See the new  instructions.   meloxicam 7.5 MG tablet Commonly known as: MOBIC Take by mouth.   metFORMIN 1000 MG tablet Commonly known as: GLUCOPHAGE Take 1 tablet (1,000 mg total) by mouth 2 (two) times daily with a meal. What changed:  medication strength how much to take when to take this   methocarbamol 500 MG tablet Commonly known as: ROBAXIN Take 1 tablet (500 mg total) by mouth 2 (two) times daily.   metoCLOPramide 5 MG tablet Commonly known as: REGLAN Take 5 mg by mouth 3 (three) times daily as needed for nausea or vomiting.   metoprolol succinate 100 MG 24 hr tablet Commonly known as: TOPROL-XL Take 100 mg by mouth daily. Take with or immediately following a meal.   montelukast 10 MG tablet Commonly known as: SINGULAIR Take 10 mg by mouth at bedtime.   naloxone 4 MG/0.1ML Liqd nasal spray kit Commonly known as: NARCAN SMARTSIG:Both Nares   nitroGLYCERIN 0.4 MG SL tablet Commonly known as: NITROSTAT Place 1 tablet (0.4 mg total) under the tongue every 5 (five) minutes as needed for chest pain (up to 3 doses. If taking 3rd dose call 911).   ondansetron 4 MG tablet Commonly known as: ZOFRAN Take 4 mg by mouth every other day.   oxyCODONE-acetaminophen 10-325 MG tablet Commonly known as: PERCOCET Take 1 tablet by mouth every 6 (six) hours as needed for pain. What changed: Another medication with the same name was removed. Continue taking this medication, and follow the directions you see here.   pantoprazole 40 MG tablet Commonly known as: PROTONIX TAKE 1 TABLET BY MOUTH TWICE DAILY BEFORE MEALS   polyethylene glycol 17 g packet Commonly known as: MIRALAX / GLYCOLAX Take 17 g by mouth daily as needed for moderate constipation.   ProAir HFA 108 (90 Base) MCG/ACT inhaler Generic drug: albuterol Inhale 2 puffs into the lungs every 4 (four) hours as needed for wheezing or shortness of breath.   promethazine 25 MG tablet Commonly known as: PHENERGAN Take 1 tablet (25  mg total) by mouth 4 (four) times daily as needed. What changed: reasons to take this   senna-docusate 8.6-50 MG tablet Commonly known as: Senokot-S Take 2 tablets by mouth at bedtime.   SSD 1 % cream Generic drug: silver sulfADIAZINE Apply topically 2 (two) times daily.   SURE COMFORT INS SYR 1CC/30G 30G X 5/16" 1 ML Misc Generic drug: Insulin Syringe-Needle U-100 1 each by Other route daily.   Vitamin D3 125 MCG (5000 UT) capsule Generic drug: Cholecalciferol TAKE ONE CAPSULE BY MOUTH DAILY               Discharge Care Instructions  (From admission, onward)           Start     Ordered   05/12/22 0000  Change dressing (specify)       Comments: Dry gauze dressing to right and  left bunion areas--change as needed   05/12/22 1138            Major procedures and Radiology Reports - PLEASE review detailed and final reports for all details, in brief -   CT ABDOMEN PELVIS W CONTRAST  Result Date: 05/09/2022 CLINICAL DATA:  Abdominal vein. EXAM: CT ABDOMEN AND PELVIS WITH CONTRAST TECHNIQUE: Multidetector CT imaging of the abdomen and pelvis was performed using the standard protocol following bolus administration of intravenous contrast. RADIATION DOSE REDUCTION: This exam was performed according to the departmental dose-optimization program which includes automated exposure control, adjustment of the mA and/or kV according to patient size and/or use of iterative reconstruction technique. CONTRAST:  128m OMNIPAQUE IOHEXOL 300 MG/ML  SOLN COMPARISON:  CT without contrast 10/10/2019. FINDINGS: Lower chest: Lung bases show mild subpleural reticulation without infiltrates. The cardiac size is normal. There is scattered three-vessel calcific CAD. Hepatobiliary: Surgically absent gallbladder, as before. No biliary dilatation. Enlarged liver 22.5 cm in length with moderate steatosis. No mass. No portal vein dilatation. Pancreas: No abnormality. Spleen: No mass. No splenomegaly. Old  central splenic infarct. 1.2 cm calcified splenic artery aneurysm at the hilum of the spleen. Adrenals/Urinary Tract: Stable 2 cm left adrenal nodule presumably an adenoma. No right adrenal mass. The bilateral kidneys enhance homogeneously. No or urinary obstruction noted. There is mild thickening in the bladder with perivesical stranding compatible with cystitis. Mild bladder distention without wall mass. Stomach/Bowel: No dilatation or wall thickening including the appendix. Moderate retained stool ascending colon. Uncomplicated sigmoid diverticulosis. Vascular/Lymphatic: Aortic atherosclerosis. No enlarged abdominal or pelvic lymph nodes. Reproductive: Status post hysterectomy. No adnexal masses. Other: Right-sided laxity and outward protrusion of the abdominal wall. No incarcerated hernia. Minimal pelvic ascites adjacent the bladder. No free air or free hemorrhage. No abscess. Musculoskeletal: Chronic findings of osteonecrosis over portions of the left femoral head. No articular surface collapse. Mild hip DJD. Degenerative changes thoracic and lumbar spine. IMPRESSION: 1. CT findings consistent with cystitis. 2. Constipation and diverticulosis. 3. Moderate hepatic steatosis. 4. Aortic and coronary artery atherosclerosis. 5. Right-sided laxity and outward protrusion of the abdominal wall. No incarcerated hernia. 6. 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 7. Stable 2 cm left adrenal nodule presumably an adenoma. 8. 1.2 cm calcified splenic artery aneurysm at the splenic hilum. Aortic Atherosclerosis (ICD10-I70.0). Electronically Signed   By: KTelford NabM.D.   On: 05/09/2022 21:24   ECHOCARDIOGRAM COMPLETE  Result Date: 05/09/2022    ECHOCARDIOGRAM REPORT   Patient Name:   Emily WOFFORDDate of Exam: 05/09/2022 Medical Rec #:  [id number]     Height:       64.0 in Accession #:    [id number]    Weight:       244.7 lb Date of Birth:  [birth year]    BSA:          2.132 m Patient Age:    89 years       BP:           162/83 mmHg Patient Gender: F              HR:           101 bpm. [facility]D Echo, Cardiac Doppler and Color Doppler Indications:    TIA G45.9  History:        Patient has prior history of Echocardiogram examinations, most  recent 08/08/2020. CHF, CAD and Previous Myocardial Infarction,                 TIA and COPD; Risk Factors:Hypertension, Diabetes and                 Dyslipidemia. Morbid obesity.  Sonographer:    Alvino Chapel RCS Referring Phys: [phone] Leanne Chang [facility]  Sonographer Comments: Technically difficult study due to poor echo windows. Patient c/o chest, left apical chest and abdomen as painfull. IMPRESSIONS  1. Left ventricular ejection fraction, by estimation, is 60 to 65%. The left ventricle has normal function. The left ventricle has no regional wall motion abnormalities. There is moderate concentric left ventricular hypertrophy. Left ventricular diastolic parameters are indeterminate.  2. Right ventricular systolic function is normal. The right ventricular size is normal. Tricuspid regurgitation signal is inadequate for assessing PA pressure.  3. The mitral valve is grossly normal. Trivial mitral valve regurgitation.  4. The aortic valve is tricuspid. There is mild calcification of the aortic valve. Aortic valve regurgitation is not visualized.  5. The inferior vena cava is normal in size with greater than 50% respiratory variability, suggesting right atrial pressure of 3 mmHg. Comparison(s): Prior images reviewed side by side. LVEF remains normal range at 60-65%. FINDINGS  Left Ventricle: Left ventricular ejection fraction, by estimation, is 60 to 65%. The left ventricle has normal function. The left ventricle has no regional wall motion abnormalities. Definity contrast agent was given IV to delineate the left ventricular  endocardial borders. The left ventricular internal cavity size was normal in size. There is moderate  concentric left ventricular hypertrophy. Left ventricular diastolic parameters are indeterminate. Right Ventricle: The right ventricular size is normal. No increase in right ventricular wall thickness. Right ventricular systolic function is normal. Tricuspid regurgitation signal is inadequate for assessing PA pressure. Left Atrium: Left atrial size was normal in size. Right Atrium: Right atrial size was normal in size. Pericardium: There is no evidence of pericardial effusion. Presence of epicardial fat layer. Mitral Valve: The mitral valve is grossly normal. Mild mitral annular calcification. Trivial mitral valve regurgitation. Tricuspid Valve: The tricuspid valve is grossly normal. Tricuspid valve regurgitation is trivial. Aortic Valve: The aortic valve is tricuspid. There is mild calcification of the aortic valve. Aortic valve regurgitation is not visualized. Pulmonic Valve: The pulmonic valve was grossly normal. Pulmonic valve regurgitation is not visualized. Aorta: The aortic root is normal in size and structure. Venous: The inferior vena cava is normal in size with greater than 50% respiratory variability, suggesting right atrial pressure of 3 mmHg. IAS/Shunts: No atrial level shunt detected by color flow Doppler.  LEFT VENTRICLE PLAX 2D LVIDd:         4.40 cm LVIDs:         2.90 cm LV PW:         1.30 cm LV IVS:        1.50 cm LVOT diam:     1.80 cm LV SV:         43 LV SV Index:   20 LVOT Area:     2.54 cm  RIGHT VENTRICLE RV S prime:     14.40 cm/s TAPSE (M-mode): 1.9 cm LEFT ATRIUM             Index        RIGHT ATRIUM           Index LA diam:        2.70 cm 1.27 cm/m  RA Area:     11.50 cm LA Vol (A2C):   38.6 ml 18.11 ml/m  RA Volume:   22.50 ml  10.56 ml/m LA Vol (A4C):   58.3 ml 27.35 ml/m LA Biplane Vol: 51.8 ml 24.30 ml/m  AORTIC VALVE LVOT Vmax:   110.00 cm/s LVOT Vmean:  73.700 cm/s LVOT VTI:    0.170 m  AORTA Ao Root diam: 3.20 cm MITRAL VALVE MV Area (PHT): 5.27 cm     SHUNTS MV Decel  Time: 144 msec     Systemic VTI:  0.17 m MV E velocity: 110.00 cm/s  Systemic Diam: 1.80 cm Rozann Lesches MD Electronically signed by Rozann Lesches MD Signature Date/Time: 05/09/2022/2:47:48 PM    Final    MR Brain Wo Contrast (neuro protocol)  Result Date: 05/08/2022 CLINICAL DATA:  Provided history: Neuro deficit, acute, stroke suspected. EXAM: MRI HEAD WITHOUT CONTRAST TECHNIQUE: Multiplanar, multiecho pulse sequences of the brain and surrounding structures were obtained without intravenous contrast. COMPARISON:  Non-contrast head CT and CT angiogram head/neck performed earlier today 05/08/2022. Report from brain MRI 02/20/2009 (images currently unavailable). FINDINGS: Intermittently motion degraded examination. Most notably, the sagittal T1-weighted sequence is moderately motion degraded. Brain: Mild generalized parenchymal atrophy. Chronic cortical/subcortical infarct within right parietooccipital lobes. Mild chronic hemosiderin deposition within the infarction territory. Small chronic lacunar infarct within the left corona radiata. Mild-to-moderate multifocal T2 FLAIR hyperintense signal abnormality elsewhere within the cerebral white matter, nonspecific but compatible with chronic small vessel ischemic disease. Multiple T2 hyperintense foci within the bilateral deep gray nuclei, likely reflecting a combination of prominent perivascular spaces and chronic lacunar infarcts. Chronic lacunar infarcts within the pons. Background mild pontine chronic small vessel ischemic disease. There is no acute infarct. No evidence of an intracranial mass. No extra-axial fluid collection. No midline shift. Vascular: Maintained flow voids within the proximal large arterial vessels. Skull and upper cervical spine: No suspicious marrow lesion. Sinuses/Orbits: No mass or acute finding within the imaged orbits. Trace mucosal thickening within the left frontal, bilateral ethmoid and left maxillary sinuses. Other: Trace fluid  within the bilateral mastoid air cells. IMPRESSION: 1. Intermittently motion degraded examination. 2. No evidence of an acute intracranial abnormality. Specifically, the diffusion-weighted imaging is of good quality and there is no evidence of an acute infarct. 3. Parenchymal atrophy, chronic small vessel ischemic disease and chronic infarcts as described. Electronically Signed   By: Kellie Simmering D.O.   On: 05/08/2022 17:56   CT ANGIO HEAD NECK W WO CM  Result Date: 05/08/2022 CLINICAL DATA:  Stroke/TIA, determine embolic source EXAM: CT ANGIOGRAPHY HEAD AND NECK TECHNIQUE: Multidetector CT imaging of the head and neck was performed using the standard protocol during bolus administration of intravenous contrast. Multiplanar CT image reconstructions and MIPs were obtained to evaluate the vascular anatomy. Carotid stenosis measurements (when applicable) are obtained utilizing NASCET criteria, using the distal internal carotid diameter as the denominator. RADIATION DOSE REDUCTION: This exam was performed according to the departmental dose-optimization program which includes automated exposure control, adjustment of the mA and/or kV according to patient size and/or use of iterative reconstruction technique. CONTRAST:  96m OMNIPAQUE IOHEXOL 350 MG/ML SOLN COMPARISON:  None Available. FINDINGS: CTA NECK FINDINGS Aortic arch: Great vessel origins are patent without significant stenosis. Right carotid system: No evidence of dissection, stenosis (50% or greater), or occlusion. Left carotid system: No evidence of dissection, stenosis (50% or greater), or occlusion. Vertebral arteries: Left dominant. Both vertebral arteries are patent without hemodynamically significant stenosis. Skeleton: No  evidence of acute abnormality on limited assessment. Multilevel facet and uncovertebral hypertrophy with varying degrees of neural foraminal stenosis. Other neck: No evidence of acute abnormality on limited assessment. Upper chest:  Biapical pleuroparenchymal scarring. Review of the MIP images confirms the above findings CTA HEAD FINDINGS Anterior circulation: Bilateral intracranial ICAs are patent with mild-to-moderate narrowing due to calcific atherosclerosis. Moderate stenosis of the right M1 MCA. Severe stenosis of both proximal and more distal M2 MCA branches. A more distal M2 MCA branch stenosis is nearly occlusive. Severe stenosis of a distal left M2 MCA branch. Bilateral ACAs are patent without high-grade proximal stenosis. Posterior circulation: Bilateral intradural vertebral arteries and basilar artery are patent without significant stenosis. Bilateral PCAs are patent with multifocal severe right P2 PCA stenosis. Multifocal mild left P2 PCA stenosis. Venous sinuses: As permitted by contrast timing, patent. Review of the MIP images confirms the above findings IMPRESSION: 1. No emergent large vessel occlusion. 2. Severe bilateral M2 MCA branch stenosis. 3. Multifocal severe right P2 PCA stenosis. 4. Moderate right proximal M1 MCA stenosis. Findings discussed with Dr. Rory Percy via telephone at 4:35 p.m. Electronically Signed   By: Margaretha Sheffield M.D.   On: 05/08/2022 16:47   CT HEAD CODE STROKE WO CONTRAST  Result Date: 05/08/2022 CLINICAL DATA:  Code stroke. Neuro deficit, acute, stroke suspected. EXAM: CT HEAD WITHOUT CONTRAST TECHNIQUE: Contiguous axial images were obtained from the base of the skull through the vertex without intravenous contrast. RADIATION DOSE REDUCTION: This exam was performed according to the departmental dose-optimization program which includes automated exposure control, adjustment of the mA and/or kV according to patient size and/or use of iterative reconstruction technique. COMPARISON:  None Available. FINDINGS: Brain: Remote right occipital infarct. Remote perforator infarct in the left basal ganglia additional patchy white matter hypodensities, nonspecific but compatible with chronic microvascular  ischemic disease. No evidence of acute large vascular territory infarct, acute hemorrhage, mass lesion, midline shift or hydrocephalus. Vascular: No hyperdense vessel identified. Skull: Normal. Negative for fracture or focal lesion. Sinuses/Orbits: No acute finding. Other: None. ASPECTS [facility] Stroke Program Early CT Score) total score (0-10 with 10 being normal): 10. IMPRESSION: 1. No evidence of acute intracranial abnormality.  ASPECTS is 10. 2. Remote infarcts and chronic microvascular ischemic disease, as above. Code stroke imaging results were communicated on 05/08/2022 at 4:03 pm to provider ZACKOWSKI via telephone, who verbally acknowledged these results. Electronically Signed   By: Margaretha Sheffield M.D.   On: 05/08/2022 16:04    Micro Results   Recent Results (from the past 240 hour(s))  Urine Culture     Status: Abnormal   Collection Time: 05/09/22  5:22 PM   Specimen: Urine, Random  Result Value Ref Range Status   Specimen Description   Final    URINE, RANDOM Performed at [facility], [address] [place], [place] [address]    Special Requests   Final    URINE, CLEAN CATCH Performed at [facility], [address] [place],  [address]    Culture >=100,000 COLONIES/mL ESCHERICHIA COLI (A)  Final   Report Status 05/12/2022 FINAL  Final   Organism ID, Bacteria ESCHERICHIA COLI (A)  Final      Susceptibility   Escherichia coli - MIC*    AMPICILLIN <=2 SENSITIVE Sensitive     CEFAZOLIN <=4 SENSITIVE Sensitive     CEFEPIME <=0.12 SENSITIVE Sensitive     CEFTRIAXONE <=0.25 SENSITIVE Sensitive     CIPROFLOXACIN <=0.25 SENSITIVE Sensitive  GENTAMICIN <=1 SENSITIVE Sensitive     IMIPENEM <=0.25 SENSITIVE Sensitive     NITROFURANTOIN <=16 SENSITIVE Sensitive     TRIMETH/SULFA <=20 SENSITIVE Sensitive     AMPICILLIN/SULBACTAM <=2 SENSITIVE Sensitive     PIP/TAZO <=4 SENSITIVE Sensitive     * >=100,000 COLONIES/mL ESCHERICHIA COLI    Today   Subjective     [name] today has no new complaints No fever  Or chills   No Nausea, Vomiting or Diarrhea Dysuria has resolved  Patient has been seen and examined prior to discharge   Objective   Blood pressure (!) 157/65, pulse 77, temperature 97.6 F (36.4 C), temperature source Oral, resp. rate 19, height '5\' 4"'$  (1.626 m), weight 111 kg, SpO2 100 %.   Intake/Output Summary (Last 24 hours) at 05/12/2022 1139 Last data filed at 05/12/2022 0900 Gross per 24 hour  Intake 720 ml  Output 2400 ml  Net -1680 ml    Exam Gen:- Awake Alert, morbidly obese, no acute distress HEENT:- [place].AT, No sclera icterus Neck-Supple Neck,No JVD,.  Lungs-  CTAB , fair symmetrical air movement CV- S1, S2 normal, regular  Abd-  +ve B.Sounds, Abd Soft,  increased truncal adiposity, no CVA area tenderness, no abdominal tenderness extremity/Skin:-pedal pulses present, R great toe and then L top posterior to great toe patient states bunion removal in January by Dr Irving Shows --- -wounds  looks dry with no drainage  Psych-affect is appropriate, oriented x3 Neuro-no significant focal weakness at this time patient has generalized weakness , no tremors -  Media Information  Document Information  Photos  Lt 1 st Metatarsal area Bunion  05/11/2022 13:54  Attached To:  Hospital Encounter on 05/08/22  Source Information  Roxan Hockey, MD  [facility]     Media Information  Document Information  Photos  Rt Big toe  05/11/2022 13:53  Attached To:  Hospital Encounter on 05/08/22  Source Information  Roxan Hockey, MD  [facility]      Data Review   CBC w Diff:  Lab Results  Component Value Date   WBC 7.8 05/12/2022   HGB 12.0 05/12/2022   HCT 40.3 05/12/2022   PLT 323 05/12/2022   LYMPHOPCT 24 05/08/2022   MONOPCT 8 05/08/2022   EOSPCT 2 05/08/2022   BASOPCT 0 05/08/2022    CMP:  Lab Results  Component Value Date   NA 141 05/12/2022   NA 140 01/01/2022   K 3.7 05/12/2022   CL 98  05/12/2022   CO2 32 05/12/2022   BUN 15 05/12/2022   BUN 17 01/01/2022   CREATININE 0.78 05/12/2022   CREATININE 0.70 11/02/2018   PROT 6.0 (L) 05/08/2022   PROT 5.6 (L) 01/01/2022   ALBUMIN 2.9 (L) 05/08/2022   ALBUMIN 3.5 (L) 01/01/2022   BILITOT 0.7 05/08/2022   BILITOT 0.3 01/01/2022   ALKPHOS 62 05/08/2022   AST 21 05/08/2022   ALT 14 05/08/2022  .  Total Discharge time is about 33 minutes  Roxan Hockey M.D on 05/12/2022 at 11:39 AM  Go to [email or web address] -  for contact info  [facility] - Office  [phone]

## 2022-05-12 NOTE — Progress Notes (Signed)
Patient being discharged to [facility]. EMS transporting patient, IV removed, discharge paper work placed in discharge packet for receiving facility. Report was called at 12:27 to [facility].

## 2022-05-12 NOTE — Care Management Important Message (Signed)
Important Message  Patient Details  Name: [name] MRN: [id number] Date of Birth: 1950-12-28   Medicare Important Message Given:  Yes     [name] 05/12/2022, 11:34 AM

## 2022-05-12 NOTE — Discharge Instructions (Signed)
1)-Take Aspirin 81 mg daily along with Plavix 75 mg daily for 21 days then after that STOP the Plavix  and continue ONLY Aspirin 81 mg daily indefinitely-- 2)Avoid ibuprofen/Advil/Aleve/Motrin/Goody Powders/Naproxen/BC powders/Meloxicam/Diclofenac/Indomethacin and other Nonsteroidal anti-inflammatory medications as these will make you more likely to bleed and can cause stomach ulcers, can also cause Kidney problems. 3)Dry gauze dressing to right and left bunion areas--change as needed

## 2022-05-12 NOTE — Plan of Care (Addendum)
Pt alert and oriented x 4. But reports forgetfulness. Med compliant. Pt received 1 dose of oxy and 1 dose of xanax this shift. Feet elevated on pillows. Pt able to turn and reposition self during overnight.  BM 2/25 per pt. Pt pending d/c to SNF awaiting approval. BS at hs was 188 no coverage given. Vitals stable. NIH 0 every 4 hours Problem: Education: Goal: Knowledge of General Education information will improve Description: Including pain rating scale, medication(s)/side effects and non-pharmacologic comfort measures Outcome: Progressing   Problem: Health Behavior/Discharge Planning: Goal: Ability to manage health-related needs will improve Outcome: Progressing   Problem: Clinical Measurements: Goal: Ability to maintain clinical measurements within normal limits will improve Outcome: Progressing Goal: Will remain free from infection Outcome: Progressing Goal: Diagnostic test results will improve Outcome: Progressing Goal: Respiratory complications will improve Outcome: Progressing Goal: Cardiovascular complication will be avoided Outcome: Progressing   Problem: Activity: Goal: Risk for activity intolerance will decrease Outcome: Progressing   Problem: Nutrition: Goal: Adequate nutrition will be maintained Outcome: Progressing   Problem: Coping: Goal: Level of anxiety will decrease Outcome: Progressing   Problem: Elimination: Goal: Will not experience complications related to bowel motility Outcome: Progressing Goal: Will not experience complications related to urinary retention Outcome: Progressing   Problem: Pain Managment: Goal: General experience of comfort will improve Outcome: Progressing   Problem: Safety: Goal: Ability to remain free from injury will improve Outcome: Progressing   Problem: Skin Integrity: Goal: Risk for impaired skin integrity will decrease Outcome: Progressing   Problem: Education: Goal: Ability to describe self-care measures that may  prevent or decrease complications (Diabetes Survival Skills Education) will improve Outcome: Progressing Goal: Individualized Educational Video(s) Outcome: Progressing   Problem: Coping: Goal: Ability to adjust to condition or change in health will improve Outcome: Progressing   Problem: Fluid Volume: Goal: Ability to maintain a balanced intake and output will improve Outcome: Progressing   Problem: Health Behavior/Discharge Planning: Goal: Ability to identify and utilize available resources and services will improve Outcome: Progressing Goal: Ability to manage health-related needs will improve Outcome: Progressing   Problem: Metabolic: Goal: Ability to maintain appropriate glucose levels will improve Outcome: Progressing   Problem: Nutritional: Goal: Maintenance of adequate nutrition will improve Outcome: Progressing Goal: Progress toward achieving an optimal weight will improve Outcome: Progressing   Problem: Skin Integrity: Goal: Risk for impaired skin integrity will decrease Outcome: Progressing   Problem: Tissue Perfusion: Goal: Adequacy of tissue perfusion will improve Outcome: Progressing   Problem: Education: Goal: Knowledge of disease or condition will improve Outcome: Progressing Goal: Knowledge of secondary prevention will improve (MUST DOCUMENT ALL) Outcome: Progressing Goal: Knowledge of patient specific risk factors will improve Elta [place] N/A or DELETE if not current risk factor) Outcome: Progressing   Problem: Ischemic Stroke/TIA Tissue Perfusion: Goal: Complications of ischemic stroke/TIA will be minimized Outcome: Progressing   Problem: Coping: Goal: Will verbalize positive feelings about self Outcome: Progressing Goal: Will identify appropriate support needs Outcome: Progressing   Problem: Health Behavior/Discharge Planning: Goal: Ability to manage health-related needs will improve Outcome: Progressing Goal: Goals will be collaboratively  established with patient/family Outcome: Progressing   Problem: Self-Care: Goal: Ability to participate in self-care as condition permits will improve Outcome: Progressing Goal: Verbalization of feelings and concerns over difficulty with self-care will improve Outcome: Progressing Goal: Ability to communicate needs accurately will improve Outcome: Progressing   Problem: Nutrition: Goal: Risk of aspiration will decrease Outcome: Progressing Goal: Dietary intake will improve Outcome: Progressing

## 2022-05-12 NOTE — Inpatient Diabetes Management (Signed)
Inpatient Diabetes Program Recommendations  AACE/ADA: New Consensus Statement on Inpatient Glycemic Control   Target Ranges:  Prepandial:   less than 140 mg/dL      Peak postprandial:   less than 180 mg/dL (1-2 hours)      Critically ill patients:  140 - 180 mg/dL    Latest Reference Range & Units 05/11/22 07:37 05/11/22 11:21 05/11/22 16:20 05/11/22 21:47 05/12/22 07:28  Glucose-Capillary 70 - 99 mg/dL 249 (H) 354 (H) 268 (H) 188 (H) 253 (H)   Review of Glycemic Control  Diabetes history: DM2 Outpatient Diabetes medications: Glipizide 5 mg daily, Metformin 1000 mg BID, Humalog 75/25 87 units BID Current orders for Inpatient glycemic control: 70/30 30 units BID, Novolog 0-15 units TID with meals, Novolog 0-5 units QHS  Inpatient Diabetes Program Recommendations:    Insulin: Please consider increasing 70/30 to 40 units QAM with breakfast and 70/30 35 units QPM with supper.  Thanks, Barnie Alderman, RN, MSN, [place] Diabetes Coordinator Inpatient Diabetes Program [phone] (Team Pager from 8am to [place])

## 2022-05-12 NOTE — TOC Transition Note (Signed)
Transition of Care [facility]) - CM/SW Discharge Note   Patient Details  Name: MARYSA HIRAYAMA MRN: [id number] Date of Birth: 10-31-1950  [facility]) CM/SW Contact:  Shade Flood, LCSW Phone Number: 05/12/2022, 12:39 PM   Clinical Narrative:     Pt stable for dc today per MD. Presented SNF bed offers to pt who selected [facility]. Insurance updated and pt is authorized for SNF.  Updated [facility] can take pt today. DC clinical sent electronically. RN to call report. EMS arranged.  No other TOC needs for dc.  Final next level of care: Skilled Nursing Facility Barriers to Discharge: Barriers Resolved   Patient Goals and CMS Choice CMS Medicare.gov Compare Post Acute Care list provided to:: Patient Choice offered to / list presented to : Patient  Discharge Placement                Patient chooses bed at: [facility] Patient to be transferred to facility by: EMS Name of family member notified: pt only Patient and family notified of of transfer: 05/12/22  Discharge Plan and Services Additional resources added to the After Visit Summary for                                       Social Determinants of Health (SDOH) Interventions SDOH Screenings   Food Insecurity: No Food Insecurity (05/08/2022)  Housing: Low Risk  (05/08/2022)  Transportation Needs: No Transportation Needs (05/08/2022)  Utilities: Not At Risk (05/08/2022)  Depression (PHQ2-9): Medium Risk (10/17/2021)  Tobacco Use: Low Risk  (05/08/2022)     Readmission Risk Interventions     No data to display

## 2022-05-13 DIAGNOSIS — R4701 Aphasia: Secondary | ICD-10-CM | POA: Diagnosis not present

## 2022-05-13 DIAGNOSIS — R5381 Other malaise: Secondary | ICD-10-CM | POA: Diagnosis not present

## 2022-05-13 DIAGNOSIS — I69354 Hemiplegia and hemiparesis following cerebral infarction affecting left non-dominant side: Secondary | ICD-10-CM | POA: Diagnosis not present

## 2022-05-13 DIAGNOSIS — N390 Urinary tract infection, site not specified: Secondary | ICD-10-CM | POA: Diagnosis not present

## 2022-05-14 DIAGNOSIS — K3184 Gastroparesis: Secondary | ICD-10-CM | POA: Diagnosis not present

## 2022-05-14 DIAGNOSIS — K219 Gastro-esophageal reflux disease without esophagitis: Secondary | ICD-10-CM | POA: Diagnosis not present

## 2022-05-14 DIAGNOSIS — I10 Essential (primary) hypertension: Secondary | ICD-10-CM | POA: Diagnosis not present

## 2022-05-14 DIAGNOSIS — I5032 Chronic diastolic (congestive) heart failure: Secondary | ICD-10-CM | POA: Diagnosis not present

## 2022-05-14 DIAGNOSIS — I2510 Atherosclerotic heart disease of native coronary artery without angina pectoris: Secondary | ICD-10-CM | POA: Diagnosis not present

## 2022-05-14 DIAGNOSIS — E785 Hyperlipidemia, unspecified: Secondary | ICD-10-CM | POA: Diagnosis not present

## 2022-05-14 DIAGNOSIS — I69354 Hemiplegia and hemiparesis following cerebral infarction affecting left non-dominant side: Secondary | ICD-10-CM | POA: Diagnosis not present

## 2022-05-14 DIAGNOSIS — E1341 Other specified diabetes mellitus with diabetic mononeuropathy: Secondary | ICD-10-CM | POA: Diagnosis not present

## 2022-05-14 DIAGNOSIS — R4701 Aphasia: Secondary | ICD-10-CM | POA: Diagnosis not present

## 2022-05-14 DIAGNOSIS — E119 Type 2 diabetes mellitus without complications: Secondary | ICD-10-CM | POA: Diagnosis not present

## 2022-05-14 DIAGNOSIS — Z794 Long term (current) use of insulin: Secondary | ICD-10-CM | POA: Diagnosis not present

## 2022-05-14 DIAGNOSIS — G459 Transient cerebral ischemic attack, unspecified: Secondary | ICD-10-CM | POA: Diagnosis not present

## 2022-05-23 ENCOUNTER — Other Ambulatory Visit: Payer: Self-pay | Admitting: *Deleted

## 2022-05-27 ENCOUNTER — Other Ambulatory Visit: Payer: Self-pay | Admitting: Cardiology

## 2022-05-27 ENCOUNTER — Other Ambulatory Visit: Payer: Self-pay | Admitting: *Deleted

## 2022-05-27 NOTE — Patient Outreach (Signed)
Foster Coordinator follow up. Verified in [facility] Ms. Arentz discharged from [facility] on 05/23/22. Screening for potential [facility] care coordination services as benefit of health plan and Primary Care Provider.   Attempted to reach Ms. Shadden via telephone numbers on file to discuss [facility] services. Telephone numbers not in service.   Called telephone number on file in [facility]. Left HIPAA compliant voicemail message to request return call.  Attempted to reach SNF social worker to inquire about home health arrangements. Not available.   Unable to reach Ms. Lacki to discuss [facility] services at this time.   Marthenia Rolling, MSN, RN,BSN [place] Acute Care Coordinator [phone] (Direct dial)

## 2022-05-28 ENCOUNTER — Ambulatory Visit: Payer: 59 | Admitting: "Endocrinology

## 2022-06-10 ENCOUNTER — Ambulatory Visit: Payer: 59 | Admitting: "Endocrinology

## 2022-06-13 ENCOUNTER — Ambulatory Visit: Payer: 59 | Admitting: Nurse Practitioner

## 2022-06-24 ENCOUNTER — Encounter: Payer: 59 | Attending: Registered Nurse | Admitting: Registered Nurse

## 2022-06-24 DIAGNOSIS — G894 Chronic pain syndrome: Secondary | ICD-10-CM | POA: Insufficient documentation

## 2022-06-24 DIAGNOSIS — G8929 Other chronic pain: Secondary | ICD-10-CM | POA: Insufficient documentation

## 2022-06-24 DIAGNOSIS — M5412 Radiculopathy, cervical region: Secondary | ICD-10-CM | POA: Insufficient documentation

## 2022-06-24 DIAGNOSIS — M25512 Pain in left shoulder: Secondary | ICD-10-CM | POA: Insufficient documentation

## 2022-06-24 DIAGNOSIS — M7061 Trochanteric bursitis, right hip: Secondary | ICD-10-CM | POA: Insufficient documentation

## 2022-06-24 DIAGNOSIS — M48062 Spinal stenosis, lumbar region with neurogenic claudication: Secondary | ICD-10-CM | POA: Insufficient documentation

## 2022-06-24 DIAGNOSIS — M542 Cervicalgia: Secondary | ICD-10-CM | POA: Insufficient documentation

## 2022-06-24 DIAGNOSIS — M7062 Trochanteric bursitis, left hip: Secondary | ICD-10-CM | POA: Insufficient documentation

## 2022-06-24 DIAGNOSIS — M25511 Pain in right shoulder: Secondary | ICD-10-CM | POA: Insufficient documentation

## 2022-06-24 DIAGNOSIS — M5416 Radiculopathy, lumbar region: Secondary | ICD-10-CM | POA: Insufficient documentation

## 2022-06-24 DIAGNOSIS — M1711 Unilateral primary osteoarthritis, right knee: Secondary | ICD-10-CM | POA: Insufficient documentation

## 2022-06-24 DIAGNOSIS — Z79891 Long term (current) use of opiate analgesic: Secondary | ICD-10-CM | POA: Insufficient documentation

## 2022-06-24 DIAGNOSIS — Z5181 Encounter for therapeutic drug level monitoring: Secondary | ICD-10-CM | POA: Insufficient documentation

## 2022-06-24 DIAGNOSIS — M546 Pain in thoracic spine: Secondary | ICD-10-CM | POA: Insufficient documentation

## 2022-06-24 DIAGNOSIS — M1712 Unilateral primary osteoarthritis, left knee: Secondary | ICD-10-CM | POA: Insufficient documentation

## 2022-07-03 ENCOUNTER — Encounter: Payer: Self-pay | Admitting: Registered Nurse

## 2022-07-03 ENCOUNTER — Encounter (HOSPITAL_BASED_OUTPATIENT_CLINIC_OR_DEPARTMENT_OTHER): Payer: 59 | Admitting: Registered Nurse

## 2022-07-03 VITALS — BP 155/76 | HR 86 | Ht 64.0 in | Wt 250.0 lb

## 2022-07-03 DIAGNOSIS — M546 Pain in thoracic spine: Secondary | ICD-10-CM

## 2022-07-03 DIAGNOSIS — M7061 Trochanteric bursitis, right hip: Secondary | ICD-10-CM

## 2022-07-03 DIAGNOSIS — M25511 Pain in right shoulder: Secondary | ICD-10-CM | POA: Diagnosis present

## 2022-07-03 DIAGNOSIS — M48062 Spinal stenosis, lumbar region with neurogenic claudication: Secondary | ICD-10-CM | POA: Diagnosis present

## 2022-07-03 DIAGNOSIS — M7062 Trochanteric bursitis, left hip: Secondary | ICD-10-CM

## 2022-07-03 DIAGNOSIS — M5412 Radiculopathy, cervical region: Secondary | ICD-10-CM

## 2022-07-03 DIAGNOSIS — M5416 Radiculopathy, lumbar region: Secondary | ICD-10-CM | POA: Diagnosis present

## 2022-07-03 DIAGNOSIS — G894 Chronic pain syndrome: Secondary | ICD-10-CM

## 2022-07-03 DIAGNOSIS — M1711 Unilateral primary osteoarthritis, right knee: Secondary | ICD-10-CM | POA: Diagnosis present

## 2022-07-03 DIAGNOSIS — Z5181 Encounter for therapeutic drug level monitoring: Secondary | ICD-10-CM | POA: Diagnosis present

## 2022-07-03 DIAGNOSIS — M542 Cervicalgia: Secondary | ICD-10-CM

## 2022-07-03 DIAGNOSIS — M1712 Unilateral primary osteoarthritis, left knee: Secondary | ICD-10-CM | POA: Diagnosis present

## 2022-07-03 DIAGNOSIS — M25512 Pain in left shoulder: Secondary | ICD-10-CM

## 2022-07-03 DIAGNOSIS — Z79891 Long term (current) use of opiate analgesic: Secondary | ICD-10-CM | POA: Diagnosis present

## 2022-07-03 DIAGNOSIS — G8929 Other chronic pain: Secondary | ICD-10-CM | POA: Diagnosis present

## 2022-07-03 NOTE — Progress Notes (Signed)
Subjective:    Patient ID: [name], female    DOB: May 15, 1950, 72 y.o.   MRN: [id number]  HPI: [name] is a 72 y.o. female who returns for follow up appointment for chronic pain and medication refill. She states her pain is located in her neck radiating into her bilateral shoulders, mid- lower back pain radiating into her bilateral  hips and bilateral lower extremities.She also reports bilateral knee pain , bilateral hands and bilateral feet with tingling and burning. She rates her pain 8. Her current exercise regime is walking with her walker  and performing stretching exercises.  [name] was admitted to [facility] on 06/06/2022 and discharged 06/09/2022 for Peripheral edema, note was reviewed. She was discharged to [facility] and [facility].    Oral Swab was Ordered today.    She was a patient at [facility]: PMP was Reviewed. Notes was requested.  The above was discussed with Dr Wynn Banker, he is aware [name] would like to return to our practice. Oral Swab was Performed awaiting results. She report no relief of her pain with Butrans patch. Patches will be destroyed per office policy at her next visit, she verbalizes understanding.    CNA in Room: [name]    Pain Inventory Average Pain 9 Pain Right Now 8 My pain is constant, sharp, burning, dull, stabbing, tingling, and aching  In the last 24 hours, has pain interfered with the following? General activity 10 Relation with others 5 Enjoyment of life 10 What TIME of day is your pain at its worst? morning , daytime, evening, night, and varies Sleep (in general) Poor  Pain is worse with: walking, bending, sitting, inactivity, standing, and some activites Pain improves with: medication Relief from Meds: 7  walk with assistance use a walker ability to climb steps?  no do you drive?  no use a wheelchair Do you have any goals in this area?  yes  retired  bowel control  problems weakness numbness tingling trouble walking spasms dizziness confusion depression anxiety loss of taste or smell  CT/MRI  New patient    Family History  Problem Relation Age of Onset   Heart attack Mother    Colon cancer Mother    Heart attack Father    Stroke Sister    Hemophilia Child    Cancer Other    Coronary artery disease Other    Cancer Brother    Cancer Maternal Aunt    Cancer Maternal Uncle    Cancer Paternal Aunt    Cancer Paternal Uncle    Cancer Brother    Social History   Socioeconomic History   Marital status: Divorced    Spouse name: Not on file   Number of children: 4   Years of education: Not on file   Highest education level: Not on file  Occupational History   Occupation: Disabled  Tobacco Use   Smoking status: Never   Smokeless tobacco: Never  Vaping Use   Vaping Use: Never used  Substance and Sexual Activity   Alcohol use: No    Alcohol/week: 0.0 standard drinks of alcohol   Drug use: No   Sexual activity: Not on file  Other Topics Concern   Not on file  Social History Narrative   Patient does not get regular exercise   Denies caffeine use    Social Determinants of Health   Financial Resource Strain: Not on file  Food Insecurity: No Food Insecurity (05/08/2022)   Hunger  Vital Sign    Worried About Programme researcher, broadcasting/film/video in the Last Year: Never true    Ran Out of Food in the Last Year: Never true  Transportation Needs: No Transportation Needs (05/08/2022)   PRAPARE - Administrator, Civil Service (Medical): No    Lack of Transportation (Non-Medical): No  Physical Activity: Not on file  Stress: Not on file  Social Connections: Not on file   Past Surgical History:  Procedure Laterality Date   ABDOMINAL HYSTERECTOMY     Arm Surgery Bilateral    due to fall-pt broke both forearms   BIOPSY N/A 07/01/2013   Procedure: BIOPSY;  Surgeon: Malissa Hippo, MD;  Location: [facility];  Service: Endoscopy;  Laterality:  N/A;   CHOLECYSTECTOMY     COLONOSCOPY  08/06/2011   Procedure: COLONOSCOPY;  Surgeon: Malissa Hippo, MD;  [facility]: [facility];  Service: Endoscopy;  Laterality: N/A;  215   COLONOSCOPY WITH PROPOFOL N/A 12/31/2012   Procedure: COLONOSCOPY WITH PROPOFOL;  Surgeon: Malissa Hippo, MD;  Location: [facility];  Service: Endoscopy;  Laterality: N/A;  in cecum at 0814, total withdrawal time   COLONOSCOPY WITH PROPOFOL N/A 02/05/2018   Procedure: COLONOSCOPY WITH PROPOFOL;  Surgeon: Malissa Hippo, MD;  [facility];  Service: Endoscopy;  Laterality: N/A;  1:25   CORONARY ANGIOPLASTY WITH STENT PLACEMENT     CYSTOSCOPY W/ URETERAL STENT PLACEMENT Right 11/10/2019   at [facility]   CYSTOSCOPY WITH RETROGRADE PYELOGRAM, URETEROSCOPY AND STENT PLACEMENT Right 12/22/2019   Procedure: CYSTOSCOPY WITH RIGHT URETERAL STENT REMOVAL; RIGHT RETROGRADE PYELOGRAM,RIGHT URETEROSCOPY;  Surgeon: Malen Gauze, MD;  Location: [facility];  Service: Urology;  Laterality: Right;   ESOPHAGEAL DILATION N/A 06/07/2015   Procedure: ESOPHAGEAL DILATION;  Surgeon: Malissa Hippo, MD;  [facility];  Service: Endoscopy;  Laterality: N/A;   ESOPHAGOGASTRODUODENOSCOPY N/A 06/07/2015   Procedure: ESOPHAGOGASTRODUODENOSCOPY (EGD);  Surgeon: Malissa Hippo, MD;  [facility];  Service: Endoscopy;  Laterality: N/A;  2:45 - moved to 1:00 - Ann to notify pt   ESOPHAGOGASTRODUODENOSCOPY (EGD) WITH PROPOFOL N/A 12/31/2012   Procedure: ESOPHAGOGASTRODUODENOSCOPY (EGD) WITH PROPOFOL;  Surgeon: Malissa Hippo, MD;  Location: [facility];  Service: Endoscopy;  Laterality: N/A;  GE junction 37,   ESOPHAGOGASTRODUODENOSCOPY (EGD) WITH PROPOFOL N/A 07/01/2013   Procedure: ESOPHAGOGASTRODUODENOSCOPY (EGD) WITH PROPOFOL;  Surgeon: Malissa Hippo, MD;  Location: [facility];  Service: Endoscopy;  Laterality: N/A;  950   MALONEY DILATION N/A 12/31/2012   Procedure: MALONEY DILATION;  Surgeon: Malissa Hippo, MD;   Location: [facility];  Service: Endoscopy;  Laterality: N/A;  used a # 54,#56   MALONEY DILATION N/A 07/01/2013   Procedure: MALONEY DILATION;  Surgeon: Malissa Hippo, MD;  Location: [facility];  Service: Endoscopy;  Laterality: N/A;  54/58; no heme present   POLYPECTOMY  02/05/2018   Procedure: POLYPECTOMY;  Surgeon: Malissa Hippo, MD;  [facility];  Service: Endoscopy;;  distal rectum (HS)   STONE EXTRACTION WITH BASKET Right 12/22/2019   Procedure: STONE EXTRACTION WITH BASKET;  Surgeon: Malen Gauze, MD;  Location: [facility];  Service: Urology;  Laterality: Right;   Past Medical History:  Diagnosis Date   Anxiety and depression    Arthritis    CAD (coronary artery disease)    Stent placement circumflex coronary 2007, catheterization 2008 patent stents. 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    High cholesterol    Hx of blood clots    Hypertension    MI (myocardial infarction)    2007   Obstructive sleep apnea    CPAP, setting ?2   Palpitations    Sleep apnea    Tachycardia    BP (!) 171/80   Pulse 86   Ht  (1.626 m)   Wt 250 lb (113.4 kg)   SpO2 94%   BMI 42.91 kg/m   Opioid Risk Score:   Fall Risk Score:  `1  Depression screen PHQ 2/9     10/17/2021    1:56 PM 08/09/2021    1:00 PM 05/16/2021    1:15 PM 10/11/2020    1:04 PM 01/24/2020    1:19 PM 11/18/2019   10:36 AM 10/26/2019    1:03 PM  Depression screen PHQ 2/9  Decreased Interest 3 1 0 1 0 1 0  Down, Depressed, Hopeless 0 1 0  PHQ - 2 Score 0 2 0  Altered sleeping 0        Tired, decreased energy 1        Change in appetite 0         Feeling bad or failure about yourself  0        Trouble concentrating 1        Moving slowly or fidgety/restless 0        Suicidal thoughts 0        PHQ-9 Score 8        Difficult doing work/chores Not difficult at all           Review of Systems  Constitutional:  Positive for unexpected weight change.  Respiratory:  Positive for apnea and shortness of breath.   Gastrointestinal:  Positive for abdominal pain and nausea.  Endocrine:       Night sweats High blood sugar  Musculoskeletal:  Positive for back pain and neck pain.       B/L knee/hand feet pain  Skin:  Positive for rash.  Neurological:  Positive for weakness.  Psychiatric/Behavioral:  Positive for sleep disturbance.   All other systems reviewed and are negative.      Objective:   Physical Exam Vitals and nursing note reviewed.  Constitutional:      Appearance: Normal appearance.  Neck:     Comments: Cervical Paraspinal Tenderness: C-5-C-6 Cardiovascular:     Rate and Rhythm: Normal rate and regular rhythm.     Pulses: Normal pulses.     Heart sounds: Normal heart sounds.  Pulmonary:     Effort: Pulmonary effort is normal.     Breath sounds: Normal breath sounds.  Musculoskeletal:     Cervical back: Normal range of motion and neck supple.     Comments: Normal Muscle Bulk and Muscle Testing Reveals:  Upper Extremities: Decreased ROM 90 Degrees  and Muscle Strength 5/5 Thoracic and Lumbar Hypersensitivity Bilateral Greater Trochanter Tenderness Lower Extremities: Full ROM and Muscle Strength 5/5 Arises from Table slowly using walker for  support Antalgic  Gait     Skin:    General: Skin is warm and dry.  Neurological:     Mental Status: She is alert and oriented to person, place, and time.  Psychiatric:        Mood and Affect: Mood normal.        Behavior: Behavior normal.         Assessment & Plan:  1.Lumbar pain lumbar spondylosis:  Encouraged to continue to increase Activity as tolerated.  07/03/2022 Continue current medication regimen. Continue current medication regimen with Oxycodone. Oral swab Pending.  We will continue the opioid monitoring program, this consists of regular clinic visits, examinations, urine drug screen, pill counts as well as use of [place] Controlled Substance Reporting system. A 12 month History has been reviewed on the [place] Controlled Substance Reporting System on 07/03/2022.  2. Lumbar Radiculitis: Continue current medication regimen with Gabapentin. 07/03/2022 3. Bilateral Knee Pain/ Degenerative: Continue Voltaren Gel. Continue to Monitor. 07/03/2022 4. Severe diabetic poly neuropathy: Continue current medication regimen with Gabapentin. 07/03/2022 5. Insomnia: No complaints today. Continue to Monitor.  07/03/2022 6.Anxiety/Depression: Continue Current Medication regimen as prescribed by PCP and   Psychiatry. 07/03/2022 7. De Quervains Tenosynovitis: No Complaints Today. 04/018/2024 8. Bilateral  Greater Trochanteric Bursitis: Continue with Ice and Heat Therapy. Continue to Monitor. 07/03/2022.  9. Cervicalgia/ Cervical Radiculitis:Continue Gabapentin. Continue with HEP as Tolerated and continue to monitor. 07/03/2022 10. Chronic Bilateral Thoracic Back Pain: Continue HEP as Tolerated. Continue current medication regimen. Continue to monitor. 07/03/2022.     F/U in 1 month

## 2022-07-04 ENCOUNTER — Telehealth: Payer: Self-pay | Admitting: *Deleted

## 2022-07-04 NOTE — Telephone Encounter (Signed)
Patient's aide called back with count on Oxycodone #40 tabs.

## 2022-07-07 LAB — DRUG TOX MONITOR 1 W/CONF, ORAL FLD
Alprazolam: 1.14 ng/mL — ABNORMAL HIGH (ref <0.50)
Amphetamines: NEGATIVE ng/mL (ref <10)
Barbiturates: NEGATIVE ng/mL (ref <10)
Benzodiazepines: POSITIVE ng/mL — AB (ref <0.50)
Buprenorphine: NEGATIVE ng/mL (ref <0.10)
Chlordiazepoxide: NEGATIVE ng/mL (ref <0.50)
Clonazepam: NEGATIVE ng/mL (ref <0.50)
Cocaine: NEGATIVE ng/mL (ref <5.0)
Codeine: NEGATIVE ng/mL (ref <2.5)
Diazepam: NEGATIVE ng/mL (ref <0.50)
Dihydrocodeine: NEGATIVE ng/mL (ref <2.5)
Fentanyl: NEGATIVE ng/mL (ref <0.10)
Flunitrazepam: NEGATIVE ng/mL (ref <0.50)
Flurazepam: NEGATIVE ng/mL (ref <0.50)
Heroin Metabolite: NEGATIVE ng/mL (ref <1.0)
Hydrocodone: NEGATIVE ng/mL (ref <2.5)
Hydromorphone: NEGATIVE ng/mL (ref <2.5)
Lorazepam: NEGATIVE ng/mL (ref <0.50)
MARIJUANA: NEGATIVE ng/mL (ref <2.5)
MDMA: NEGATIVE ng/mL (ref <10)
Meprobamate: NEGATIVE ng/mL (ref <2.5)
Methadone: NEGATIVE ng/mL (ref <5.0)
Midazolam: NEGATIVE ng/mL (ref <0.50)
Morphine: NEGATIVE ng/mL (ref <2.5)
Nicotine Metabolite: NEGATIVE ng/mL (ref <5.0)
Nordiazepam: NEGATIVE ng/mL (ref <0.50)
Norhydrocodone: NEGATIVE ng/mL (ref <2.5)
Noroxycodone: 7.1 ng/mL — ABNORMAL HIGH (ref <2.5)
Opiates: POSITIVE ng/mL — AB (ref <2.5)
Oxazepam: NEGATIVE ng/mL (ref <0.50)
Oxycodone: 26.1 ng/mL — ABNORMAL HIGH (ref <2.5)
Oxymorphone: NEGATIVE ng/mL (ref <2.5)
Phencyclidine: NEGATIVE ng/mL (ref <10)
Tapentadol: NEGATIVE ng/mL (ref <5.0)
Temazepam: NEGATIVE ng/mL (ref <0.50)
Tramadol: NEGATIVE ng/mL (ref <5.0)
Triazolam: NEGATIVE ng/mL (ref <0.50)
Zolpidem: NEGATIVE ng/mL (ref <5.0)

## 2022-07-07 LAB — DRUG TOX ALC METAB W/CON, ORAL FLD: Alcohol Metabolite: NEGATIVE ng/mL (ref <25)

## 2022-07-08 ENCOUNTER — Ambulatory Visit: Payer: Self-pay | Admitting: Nurse Practitioner

## 2022-07-08 ENCOUNTER — Telehealth: Payer: Self-pay

## 2022-07-08 NOTE — Telephone Encounter (Signed)
Called identified as patient Aide. Call back phone [phone].   Aris Lot wanted to know if her UDS result was back? She has about 7 day supply of Oxycodone 10-325 on hand.   Filled  Written  ID  Drug  QTY  Days  Prescriber  RX #  Dispenser  Refill  Daily Dose*  Pymt Type  PMP  06/27/2022 06/27/2022 1  Oxycodone-Acetaminophen 10-325 60.00 [address] [place] [id number] [place] [phone]) 0/0 60.00 MME Medicare [place] 06/11/2022 03/31/2022 1  Alprazolam 0.5 Mg Tablet 90.00 [address] [id number] [place] (0252) 2/2 3.00 LME Medicare [place]

## 2022-07-09 MED ORDER — OXYCODONE-ACETAMINOPHEN 10-325 MG PO TABS: 1.0000 | ORAL_TABLET | Freq: Four times a day (QID) | ORAL | 0 refills | Status: DC | PRN

## 2022-07-09 NOTE — Telephone Encounter (Signed)
UDS was Reviewed. PMP was reviewed.  Oxycodone e-scribed to pharmacy. Call placed to Ms. Heymann regarding the above and she was educated on the [employer] and she verbalizes understanding.  She stated Butrans Patch was not effective controlling her pain on 07/03/2022, she will bring Patches on her next visit to be destroyed. She verbalizes understanding. She and her aide is aware not to use the Butrans Patch on her visit on 07/03/2022, she verbalizes understanding.

## 2022-07-10 ENCOUNTER — Other Ambulatory Visit: Payer: Self-pay

## 2022-07-10 ENCOUNTER — Emergency Department (HOSPITAL_COMMUNITY): Payer: 59

## 2022-07-10 ENCOUNTER — Inpatient Hospital Stay (HOSPITAL_COMMUNITY)
Admission: EM | Admit: 2022-07-10 | Discharge: 2022-07-14 | DRG: 872 | Disposition: A | Discharge status: Home Health | Payer: 59 | Attending: Family Medicine | Admitting: Family Medicine

## 2022-07-10 DIAGNOSIS — E876 Hypokalemia: Secondary | ICD-10-CM | POA: Diagnosis present

## 2022-07-10 DIAGNOSIS — Z800 Family history of malignant neoplasm of digestive organs: Secondary | ICD-10-CM

## 2022-07-10 DIAGNOSIS — Z9049 Acquired absence of other specified parts of digestive tract: Secondary | ICD-10-CM

## 2022-07-10 DIAGNOSIS — I5032 Chronic diastolic (congestive) heart failure: Secondary | ICD-10-CM | POA: Diagnosis present

## 2022-07-10 DIAGNOSIS — J449 Chronic obstructive pulmonary disease, unspecified: Secondary | ICD-10-CM | POA: Diagnosis present

## 2022-07-10 DIAGNOSIS — R6520 Severe sepsis without septic shock: Secondary | ICD-10-CM | POA: Diagnosis not present

## 2022-07-10 DIAGNOSIS — F419 Anxiety disorder, unspecified: Secondary | ICD-10-CM | POA: Diagnosis present

## 2022-07-10 DIAGNOSIS — I110 Hypertensive heart disease with heart failure: Secondary | ICD-10-CM | POA: Diagnosis present

## 2022-07-10 DIAGNOSIS — Z832 Family history of diseases of the blood and blood-forming organs and certain disorders involving the immune mechanism: Secondary | ICD-10-CM

## 2022-07-10 DIAGNOSIS — E1142 Type 2 diabetes mellitus with diabetic polyneuropathy: Secondary | ICD-10-CM | POA: Diagnosis present

## 2022-07-10 DIAGNOSIS — Z79899 Other long term (current) drug therapy: Secondary | ICD-10-CM

## 2022-07-10 DIAGNOSIS — N12 Tubulo-interstitial nephritis, not specified as acute or chronic: Secondary | ICD-10-CM | POA: Diagnosis not present

## 2022-07-10 DIAGNOSIS — E1165 Type 2 diabetes mellitus with hyperglycemia: Secondary | ICD-10-CM

## 2022-07-10 DIAGNOSIS — E872 Acidosis, unspecified: Secondary | ICD-10-CM | POA: Diagnosis present

## 2022-07-10 DIAGNOSIS — D649 Anemia, unspecified: Secondary | ICD-10-CM | POA: Diagnosis present

## 2022-07-10 DIAGNOSIS — Z794 Long term (current) use of insulin: Secondary | ICD-10-CM

## 2022-07-10 DIAGNOSIS — Z86718 Personal history of other venous thrombosis and embolism: Secondary | ICD-10-CM

## 2022-07-10 DIAGNOSIS — Z8249 Family history of ischemic heart disease and other diseases of the circulatory system: Secondary | ICD-10-CM | POA: Diagnosis not present

## 2022-07-10 DIAGNOSIS — G4733 Obstructive sleep apnea (adult) (pediatric): Secondary | ICD-10-CM | POA: Diagnosis present

## 2022-07-10 DIAGNOSIS — R079 Chest pain, unspecified: Secondary | ICD-10-CM | POA: Diagnosis present

## 2022-07-10 DIAGNOSIS — F32A Depression, unspecified: Secondary | ICD-10-CM | POA: Diagnosis present

## 2022-07-10 DIAGNOSIS — Z1152 Encounter for screening for COVID-19: Secondary | ICD-10-CM | POA: Diagnosis not present

## 2022-07-10 DIAGNOSIS — Z1401 Asymptomatic hemophilia A carrier: Secondary | ICD-10-CM

## 2022-07-10 DIAGNOSIS — I10 Essential (primary) hypertension: Secondary | ICD-10-CM | POA: Diagnosis present

## 2022-07-10 DIAGNOSIS — E7800 Pure hypercholesterolemia, unspecified: Secondary | ICD-10-CM | POA: Diagnosis present

## 2022-07-10 DIAGNOSIS — Z955 Presence of coronary angioplasty implant and graft: Secondary | ICD-10-CM | POA: Diagnosis not present

## 2022-07-10 DIAGNOSIS — Z8673 Personal history of transient ischemic attack (TIA), and cerebral infarction without residual deficits: Secondary | ICD-10-CM

## 2022-07-10 DIAGNOSIS — I2510 Atherosclerotic heart disease of native coronary artery without angina pectoris: Secondary | ICD-10-CM | POA: Diagnosis present

## 2022-07-10 DIAGNOSIS — A419 Sepsis, unspecified organism: Secondary | ICD-10-CM | POA: Diagnosis not present

## 2022-07-10 DIAGNOSIS — Z90710 Acquired absence of both cervix and uterus: Secondary | ICD-10-CM

## 2022-07-10 DIAGNOSIS — I252 Old myocardial infarction: Secondary | ICD-10-CM | POA: Diagnosis not present

## 2022-07-10 DIAGNOSIS — Z7902 Long term (current) use of antithrombotics/antiplatelets: Secondary | ICD-10-CM

## 2022-07-10 DIAGNOSIS — R54 Age-related physical debility: Secondary | ICD-10-CM | POA: Diagnosis present

## 2022-07-10 DIAGNOSIS — A4151 Sepsis due to Escherichia coli [E. coli]: Secondary | ICD-10-CM | POA: Diagnosis not present

## 2022-07-10 DIAGNOSIS — Z888 Allergy status to other drugs, medicaments and biological substances status: Secondary | ICD-10-CM

## 2022-07-10 DIAGNOSIS — Z6841 Body Mass Index (BMI) 40.0 and over, adult: Secondary | ICD-10-CM

## 2022-07-10 DIAGNOSIS — M1990 Unspecified osteoarthritis, unspecified site: Secondary | ICD-10-CM | POA: Diagnosis present

## 2022-07-10 DIAGNOSIS — Z7984 Long term (current) use of oral hypoglycemic drugs: Secondary | ICD-10-CM

## 2022-07-10 DIAGNOSIS — Z791 Long term (current) use of non-steroidal anti-inflammatories (NSAID): Secondary | ICD-10-CM

## 2022-07-10 DIAGNOSIS — Z823 Family history of stroke: Secondary | ICD-10-CM

## 2022-07-10 DIAGNOSIS — Z7982 Long term (current) use of aspirin: Secondary | ICD-10-CM

## 2022-07-10 DIAGNOSIS — K219 Gastro-esophageal reflux disease without esophagitis: Secondary | ICD-10-CM | POA: Diagnosis present

## 2022-07-10 LAB — COMPREHENSIVE METABOLIC PANEL WITH GFR
ALT: 15 U/L (ref 0–44)
AST: 14 U/L — ABNORMAL LOW (ref 15–41)
Albumin: 2.8 g/dL — ABNORMAL LOW (ref 3.5–5.0)
Alkaline Phosphatase: 69 U/L (ref 38–126)
Anion gap: 7 (ref 5–15)
BUN: 16 mg/dL (ref 8–23)
CO2: 32 mmol/L (ref 22–32)
Calcium: 8.5 mg/dL — ABNORMAL LOW (ref 8.9–10.3)
Chloride: 96 mmol/L — ABNORMAL LOW (ref 98–111)
Creatinine, Ser: 0.95 mg/dL (ref 0.44–1.00)
GFR, Estimated: >60 mL/min (ref >60)
Glucose, Bld: 220 mg/dL — ABNORMAL HIGH (ref 70–99)
Potassium: 3.8 mmol/L (ref 3.5–5.1)
Sodium: 135 mmol/L (ref 135–145)
Total Bilirubin: 1.1 mg/dL (ref 0.3–1.2)
Total Protein: 6.4 g/dL — ABNORMAL LOW (ref 6.5–8.1)

## 2022-07-10 LAB — URINALYSIS, ROUTINE W REFLEX MICROSCOPIC
Bilirubin Urine: NEGATIVE
Glucose, UA: 50 mg/dL — AB
Ketones, ur: NEGATIVE mg/dL
Nitrite: POSITIVE — AB
Protein, ur: NEGATIVE mg/dL
Specific Gravity, Urine: 1.014 (ref 1.005–1.030)
pH: 5 (ref 5.0–8.0)

## 2022-07-10 LAB — CBC WITH DIFFERENTIAL/PLATELET
Abs Immature Granulocytes: 0.05 10*3/uL (ref 0.00–0.07)
Basophils Absolute: 0.1 10*3/uL (ref 0.0–0.1)
Basophils Relative: 0 %
Eosinophils Absolute: 0 10*3/uL (ref 0.0–0.5)
Eosinophils Relative: 0 %
HCT: 37.7 % (ref 36.0–46.0)
Hemoglobin: 11.8 g/dL — ABNORMAL LOW (ref 12.0–15.0)
Immature Granulocytes: 0 %
Lymphocytes Relative: 14 %
Lymphs Abs: 2.1 10*3/uL (ref 0.7–4.0)
MCH: 26.4 pg (ref 26.0–34.0)
MCHC: 31.3 g/dL (ref 30.0–36.0)
MCV: 84.3 fL (ref 80.0–100.0)
Monocytes Absolute: 1.6 10*3/uL — ABNORMAL HIGH (ref 0.1–1.0)
Monocytes Relative: 11 %
Neutro Abs: 11 10*3/uL — ABNORMAL HIGH (ref 1.7–7.7)
Neutrophils Relative %: 75 %
Platelets: 349 10*3/uL (ref 150–400)
RBC: 4.47 MIL/uL (ref 3.87–5.11)
RDW: 15.4 % (ref 11.5–15.5)
WBC: 14.9 10*3/uL — ABNORMAL HIGH (ref 4.0–10.5)
nRBC: 0 % (ref 0.0–0.2)

## 2022-07-10 LAB — RESP PANEL BY RT-PCR (RSV, FLU A&B, COVID)  RVPGX2
Influenza A by PCR: NEGATIVE
Influenza B by PCR: NEGATIVE
Resp Syncytial Virus by PCR: NEGATIVE
SARS Coronavirus 2 by RT PCR: NEGATIVE

## 2022-07-10 LAB — LIPASE, BLOOD: Lipase: 22 U/L (ref 11–51)

## 2022-07-10 LAB — GLUCOSE, CAPILLARY
Glucose-Capillary: 316 mg/dL — ABNORMAL HIGH (ref 70–99)
Glucose-Capillary: 379 mg/dL — ABNORMAL HIGH (ref 70–99)

## 2022-07-10 LAB — BRAIN NATRIURETIC PEPTIDE: B Natriuretic Peptide: 29 pg/mL (ref 0.0–100.0)

## 2022-07-10 LAB — PROTIME-INR
INR: 1 (ref 0.8–1.2)
Prothrombin Time: 13 seconds (ref 11.4–15.2)

## 2022-07-10 LAB — LACTIC ACID, PLASMA
Lactic Acid, Venous: 2 mmol/L (ref 0.5–1.9)
Lactic Acid, Venous: 1.7 mmol/L (ref 0.5–1.9)

## 2022-07-10 LAB — TROPONIN I (HIGH SENSITIVITY)
Troponin I (High Sensitivity): 4 ng/L (ref <18)
Troponin I (High Sensitivity): 6 ng/L (ref <18)

## 2022-07-10 MED ORDER — NITROGLYCERIN 0.4 MG SL SUBL: 0.4000 mg | SUBLINGUAL_TABLET | SUBLINGUAL | Status: DC | PRN

## 2022-07-10 MED ORDER — HYDRALAZINE HCL 20 MG/ML IJ SOLN: 5.0000 mg | INTRAMUSCULAR | Status: DC | PRN

## 2022-07-10 MED ORDER — INSULIN ASPART 100 UNIT/ML IJ SOLN
0.0000 [IU] | Freq: Three times a day (TID) | INTRAMUSCULAR | Status: DC
  Administered 2022-07-11 (×2): 8 [IU] via SUBCUTANEOUS
  Administered 2022-07-11: 11 [IU] via SUBCUTANEOUS
  Administered 2022-07-12 (×3): 8 [IU] via SUBCUTANEOUS
  Administered 2022-07-13: 3 [IU] via SUBCUTANEOUS
  Administered 2022-07-13: 5 [IU] via SUBCUTANEOUS
  Administered 2022-07-13: 8 [IU] via SUBCUTANEOUS
  Administered 2022-07-14: 2 [IU] via SUBCUTANEOUS

## 2022-07-10 MED ORDER — ACETAMINOPHEN 325 MG PO TABS
650.0000 mg | ORAL_TABLET | Freq: Four times a day (QID) | ORAL | Status: DC | PRN
  Administered 2022-07-10: 650 mg via ORAL
  Filled 2022-07-10: qty 2

## 2022-07-10 MED ORDER — MONTELUKAST SODIUM 10 MG PO TABS
10.0000 mg | ORAL_TABLET | Freq: Every day | ORAL | Status: DC
  Administered 2022-07-10 – 2022-07-13 (×4): 10 mg via ORAL
  Filled 2022-07-10 (×4): qty 1

## 2022-07-10 MED ORDER — DOXEPIN HCL 25 MG PO CAPS
75.0000 mg | ORAL_CAPSULE | Freq: Every day | ORAL | Status: DC
  Administered 2022-07-10 – 2022-07-13 (×4): 75 mg via ORAL
  Filled 2022-07-10 (×4): qty 3

## 2022-07-10 MED ORDER — BUPROPION HCL ER (SR) 100 MG PO TB12
100.0000 mg | ORAL_TABLET | Freq: Every day | ORAL | Status: DC
  Administered 2022-07-11 – 2022-07-14 (×4): 100 mg via ORAL
  Filled 2022-07-10 (×4): qty 1

## 2022-07-10 MED ORDER — ASPIRIN 81 MG PO TBEC
243.0000 mg | DELAYED_RELEASE_TABLET | Freq: Once | ORAL | Status: AC
  Administered 2022-07-10: 243 mg via ORAL
  Filled 2022-07-10: qty 3

## 2022-07-10 MED ORDER — ONDANSETRON HCL 4 MG/2ML IJ SOLN: 4.0000 mg | Freq: Four times a day (QID) | INTRAMUSCULAR | Status: DC | PRN

## 2022-07-10 MED ORDER — INSULIN ASPART 100 UNIT/ML IJ SOLN
0.0000 [IU] | Freq: Every day | INTRAMUSCULAR | Status: DC
  Administered 2022-07-10 – 2022-07-11 (×2): 5 [IU] via SUBCUTANEOUS
  Administered 2022-07-12: 3 [IU] via SUBCUTANEOUS
  Administered 2022-07-13: 2 [IU] via SUBCUTANEOUS

## 2022-07-10 MED ORDER — LACTATED RINGERS IV BOLUS
500.0000 mL | Freq: Once | INTRAVENOUS | Status: AC
  Administered 2022-07-10: 500 mL via INTRAVENOUS

## 2022-07-10 MED ORDER — SODIUM CHLORIDE 0.9 % IV SOLN: 2.0000 g | Freq: Three times a day (TID) | INTRAVENOUS | Status: DC

## 2022-07-10 MED ORDER — ALPRAZOLAM 0.5 MG PO TABS
0.5000 mg | ORAL_TABLET | Freq: Two times a day (BID) | ORAL | Status: DC | PRN
  Administered 2022-07-10 – 2022-07-13 (×4): 0.5 mg via ORAL
  Filled 2022-07-10 (×4): qty 1

## 2022-07-10 MED ORDER — ONDANSETRON HCL 4 MG PO TABS: 4.0000 mg | ORAL_TABLET | Freq: Four times a day (QID) | ORAL | Status: DC | PRN

## 2022-07-10 MED ORDER — VANCOMYCIN HCL 2000 MG/400ML IV SOLN
2000.0000 mg | Freq: Once | INTRAVENOUS | Status: AC
  Administered 2022-07-10: 2000 mg via INTRAVENOUS
  Filled 2022-07-10: qty 400

## 2022-07-10 MED ORDER — SODIUM CHLORIDE 0.9 % IV SOLN
2.0000 g | Freq: Once | INTRAVENOUS | Status: AC
  Administered 2022-07-10: 2 g via INTRAVENOUS
  Filled 2022-07-10: qty 12.5

## 2022-07-10 MED ORDER — IOHEXOL 350 MG/ML SOLN
100.0000 mL | Freq: Once | INTRAVENOUS | Status: AC | PRN
  Administered 2022-07-10: 100 mL via INTRAVENOUS

## 2022-07-10 MED ORDER — VANCOMYCIN HCL IN DEXTROSE 1-5 GM/200ML-% IV SOLN: 1000.0000 mg | Freq: Once | INTRAVENOUS | Status: DC

## 2022-07-10 MED ORDER — ATORVASTATIN CALCIUM 40 MG PO TABS
40.0000 mg | ORAL_TABLET | Freq: Every day | ORAL | Status: DC
  Administered 2022-07-10 – 2022-07-14 (×5): 40 mg via ORAL
  Filled 2022-07-10 (×6): qty 1

## 2022-07-10 MED ORDER — PANTOPRAZOLE SODIUM 40 MG PO TBEC
40.0000 mg | DELAYED_RELEASE_TABLET | Freq: Two times a day (BID) | ORAL | Status: DC
  Administered 2022-07-11 – 2022-07-14 (×7): 40 mg via ORAL
  Filled 2022-07-10 (×7): qty 1

## 2022-07-10 MED ORDER — METRONIDAZOLE 500 MG/100ML IV SOLN
500.0000 mg | Freq: Once | INTRAVENOUS | Status: AC
  Administered 2022-07-10: 500 mg via INTRAVENOUS
  Filled 2022-07-10: qty 100

## 2022-07-10 MED ORDER — METOPROLOL SUCCINATE ER 50 MG PO TB24
100.0000 mg | ORAL_TABLET | Freq: Every day | ORAL | Status: DC
  Administered 2022-07-11 – 2022-07-14 (×4): 100 mg via ORAL
  Filled 2022-07-10 (×4): qty 2

## 2022-07-10 MED ORDER — OXYCODONE-ACETAMINOPHEN 5-325 MG PO TABS
1.0000 | ORAL_TABLET | Freq: Four times a day (QID) | ORAL | Status: DC | PRN
  Administered 2022-07-10 – 2022-07-13 (×6): 1 via ORAL
  Filled 2022-07-10 (×6): qty 1

## 2022-07-10 MED ORDER — GABAPENTIN 300 MG PO CAPS
300.0000 mg | ORAL_CAPSULE | Freq: Three times a day (TID) | ORAL | Status: DC
  Administered 2022-07-10 – 2022-07-14 (×11): 300 mg via ORAL
  Filled 2022-07-10 (×11): qty 1

## 2022-07-10 MED ORDER — SODIUM CHLORIDE 0.9 % IV SOLN: 2.0000 g | INTRAVENOUS | Status: DC

## 2022-07-10 MED ORDER — ASPIRIN 81 MG PO TBEC
81.0000 mg | DELAYED_RELEASE_TABLET | Freq: Every day | ORAL | Status: DC
  Administered 2022-07-11 – 2022-07-14 (×4): 81 mg via ORAL
  Filled 2022-07-10 (×4): qty 1

## 2022-07-10 MED ORDER — OXYCODONE HCL 5 MG PO TABS
5.0000 mg | ORAL_TABLET | Freq: Four times a day (QID) | ORAL | Status: DC | PRN
  Administered 2022-07-10 – 2022-07-13 (×6): 5 mg via ORAL
  Filled 2022-07-10 (×6): qty 1

## 2022-07-10 MED ORDER — ENOXAPARIN SODIUM 60 MG/0.6ML IJ SOSY
60.0000 mg | PREFILLED_SYRINGE | INTRAMUSCULAR | Status: DC
  Administered 2022-07-10 – 2022-07-13 (×4): 60 mg via SUBCUTANEOUS
  Filled 2022-07-10 (×4): qty 0.6

## 2022-07-10 MED ORDER — SODIUM CHLORIDE 0.9 % IV SOLN
2.0000 g | INTRAVENOUS | Status: DC
  Administered 2022-07-11 – 2022-07-12 (×2): 2 g via INTRAVENOUS
  Filled 2022-07-10 (×3): qty 20

## 2022-07-10 MED ORDER — MELATONIN 3 MG PO TABS
6.0000 mg | ORAL_TABLET | Freq: Every evening | ORAL | Status: DC | PRN
  Administered 2022-07-10: 6 mg via ORAL
  Filled 2022-07-10: qty 2

## 2022-07-10 MED ORDER — ACETAMINOPHEN 500 MG PO TABS
1000.0000 mg | ORAL_TABLET | Freq: Once | ORAL | Status: AC
  Administered 2022-07-10: 1000 mg via ORAL
  Filled 2022-07-10: qty 2

## 2022-07-10 MED ORDER — ISOSORBIDE MONONITRATE ER 60 MG PO TB24
30.0000 mg | ORAL_TABLET | Freq: Every day | ORAL | Status: DC
  Administered 2022-07-11 – 2022-07-14 (×4): 30 mg via ORAL
  Filled 2022-07-10 (×4): qty 1

## 2022-07-10 MED ORDER — INSULIN GLARGINE-YFGN 100 UNIT/ML ~~LOC~~ SOLN
25.0000 [IU] | Freq: Two times a day (BID) | SUBCUTANEOUS | Status: DC
  Administered 2022-07-10: 25 [IU] via SUBCUTANEOUS
  Filled 2022-07-10 (×4): qty 0.25

## 2022-07-10 MED ORDER — VANCOMYCIN HCL 1250 MG/250ML IV SOLN: 1250.0000 mg | INTRAVENOUS | Status: DC

## 2022-07-10 MED ORDER — NITROGLYCERIN 2 % TD OINT
1.0000 [in_us] | TOPICAL_OINTMENT | Freq: Once | TRANSDERMAL | Status: AC
  Administered 2022-07-10: 1 [in_us] via TOPICAL
  Filled 2022-07-10: qty 1

## 2022-07-10 MED ORDER — OXYCODONE-ACETAMINOPHEN 10-325 MG PO TABS: 1.0000 | ORAL_TABLET | Freq: Four times a day (QID) | ORAL | Status: DC | PRN

## 2022-07-10 MED ORDER — DOCUSATE SODIUM 100 MG PO CAPS
100.0000 mg | ORAL_CAPSULE | Freq: Two times a day (BID) | ORAL | Status: DC
  Administered 2022-07-12 – 2022-07-14 (×5): 100 mg via ORAL
  Filled 2022-07-10 (×8): qty 1

## 2022-07-10 NOTE — ED Triage Notes (Signed)
Pt c/o chest pain that started last night. Pt c/o n/v with chest pain all night. Pt took 81 mg ASA this morning. [facility]S gave 1 nitro and it helped the chest pain. NO visible sweating noted.

## 2022-07-10 NOTE — ED Provider Notes (Signed)
EMERGENCY DEPARTMENT AT [facility] Provider Note   CSN: [id number] Arrival date & time: 07/10/22  1254     History  Chief Complaint  Patient presents with   Chest Pain    CLEMENTINE SOULLIERE is a 72 y.o. female.  72 year old female with past medical history significant for CAD, CHF presents today for evaluation of chest pain since last night that has persisted through this morning.  It did improve and route after receiving nitro.  For the past 2 days she also reports fever.  Denies any URI symptoms, or dysuria.  She does have abdominal pain which she said started last night.  This is new.  No emesis.  She is compliant with her Plavix, and aspirin.  Outside of the 81 mg aspirin this morning she has not had any additional aspirin.  Endorses some shortness of breath.  Denies lightheadedness, or diaphoresis.  Pain does not radiate anywhere.  It is left-sided.  The history is provided by the patient. No language interpreter was used.       Home Medications Prior to Admission medications   Medication Sig Start Date End Date Taking? Authorizing Provider  acetaminophen (TYLENOL) 325 MG tablet Take 2 tablets (650 mg total) by mouth every 4 (four) hours as needed for mild pain (or temp > 37.5 C (99.5 F)). 05/12/22   Shon Hale, MD  ALPRAZolam Prudy Feeler) 0.5 MG tablet Take 1 tablet (0.5 mg total) by mouth 2 (two) times daily as needed for anxiety or sleep. 05/12/22   Shon Hale, MD  aspirin EC 81 MG tablet Take 1 tablet (81 mg total) by mouth daily with breakfast. -Take Aspirin 81 mg daily along with Plavix 75 mg daily for 21 days then after that STOP the Plavix  and continue ONLY Aspirin 81 mg daily indefinitely-- 05/12/22   Shon Hale, MD  atorvastatin (LIPITOR) 80 MG tablet Take 0.5 tablets (40 mg total) by mouth daily. 05/12/22   Shon Hale, MD  B-D ULTRAFINE III SHORT PEN 31G X 8 MM MISC Inject into the skin 4 (four) times daily. 08/26/19   [provider]  Blood Glucose Monitoring Suppl (ACCU-CHEK GUIDE ME) w/Device KIT 1 Piece by Does not apply route as directed. 01/27/22   Roma Kayser, MD  buPROPion ER (WELLBUTRIN SR) 100 MG 12 hr tablet Take 1 tablet (100 mg total) by mouth daily. 05/12/22   Shon Hale, MD  Cholecalciferol (VITAMIN D3) 125 MCG (5000 UT) CAPS TAKE ONE CAPSULE BY MOUTH DAILY Patient taking differently: Take 5,000 Units by mouth daily. 04/04/19   Roma Kayser, MD  clopidogrel (PLAVIX) 75 MG tablet Take 1 tablet (75 mg total) by mouth daily. -Take Aspirin 81 mg daily along with Plavix 75 mg daily for 21 days then after that STOP the Plavix  and continue ONLY Aspirin 81 mg daily indefinitely-- 05/13/22   Shon Hale, MD  diclofenac Sodium (VOLTAREN) 1 % GEL Apply topically. 09/23/19   [provider]  doxepin (SINEQUAN) 25 MG capsule Take 75 mg by mouth at bedtime.  09/23/19   [provider]  ferrous sulfate 325 (65 FE) MG EC tablet Take by mouth. 06/20/21 07/03/22  [provider]  furosemide (LASIX) 40 MG tablet Take 1.5 tablets (60 mg total) by mouth daily. 07/11/21   Antoine Poche, MD  gabapentin (NEURONTIN) 400 MG capsule Take 600 mg by mouth 3 (three) times daily. 11/24/16   [provider]  Insulin Lispro Prot &  Lispro (HUMALOG 75/25 MIX) (75-25) 100 UNIT/ML Kwikpen Inject 90 Units into the skin 2 (two) times daily with a meal. 05/12/22   Emokpae, Courage, MD  isosorbide mononitrate (IMDUR) 30 MG 24 hr tablet TAKE 1/2 TABLET BY MOUTH DAILY (Need TO make APPOINTMENT FOR NEXT refills) 05/27/22   Antoine Poche, MD  lidocaine (XYLOCAINE) 5 % ointment Apply topically 2 (two) times daily. 01/17/22   [provider]  lisinopril (ZESTRIL) 5 MG tablet TAKE 1 TABLET BY MOUTH DAILY (Make APPOINTMENT FOR NEXT refills) 05/27/22   Antoine Poche, MD  meloxicam (MOBIC) 7.5 MG tablet Take by mouth. 09/26/21   [provider]  metFORMIN (GLUCOPHAGE)  1000 MG tablet Take 1 tablet (1,000 mg total) by mouth 2 (two) times daily with a meal. 05/12/22   Emokpae, Courage, MD  methocarbamol (ROBAXIN) 500 MG tablet Take 1 tablet (500 mg total) by mouth 2 (two) times daily. 05/12/22   Shon Hale, MD  metoCLOPramide (REGLAN) 5 MG tablet Take 5 mg by mouth 3 (three) times daily as needed for nausea or vomiting.  11/09/17   [provider]  metoprolol succinate (TOPROL-XL) 100 MG 24 hr tablet Take 100 mg by mouth daily. Take with or immediately following a meal.    [provider]  montelukast (SINGULAIR) 10 MG tablet Take 10 mg by mouth at bedtime.  09/23/19   [provider]  naloxone [facility]) nasal spray 4 mg/0.1 mL SMARTSIG:Both Nares 02/25/22   [provider]  nitroGLYCERIN (NITROSTAT) 0.4 MG SL tablet Place 1 tablet (0.4 mg total) under the tongue every 5 (five) minutes as needed for chest pain (up to 3 doses. If taking 3rd dose call 911). 08/08/20   Dunn, Kriste Basque N, PA-C  ondansetron (ZOFRAN) 4 MG tablet Take 4 mg by mouth every other day.    [provider]  oxyCODONE-acetaminophen (PERCOCET) 10-325 MG tablet Take 1 tablet by mouth every 6 (six) hours as needed for pain. 07/09/22   Jones Bales, NP  pantoprazole (PROTONIX) 40 MG tablet TAKE 1 TABLET BY MOUTH TWICE DAILY BEFORE MEALS Patient taking differently: Take 40 mg by mouth 2 (two) times daily before a meal. 06/29/19   Rehman, Joline Maxcy, MD  polyethylene glycol (MIRALAX / GLYCOLAX) 17 g packet Take 17 g by mouth daily as needed for moderate constipation.    [provider]  PROAIR HFA 108 [id number] Base) MCG/ACT inhaler Inhale 2 puffs into the lungs every 4 (four) hours as needed for wheezing or shortness of breath.  11/24/16   [provider]  promethazine (PHENERGAN) 25 MG tablet Take 1 tablet (25 mg total) by mouth 4 (four) times daily as needed. Patient taking differently: Take 25 mg by mouth 4 (four) times daily as needed for nausea or  vomiting. 12/29/19   McKenzie, Mardene Celeste, MD  senna-docusate (SENOKOT-S) 8.6-50 MG tablet Take 2 tablets by mouth at bedtime. 05/12/22   Shon Hale, MD  SSD 1 % cream Apply topically 2 (two) times daily. 12/12/21   [provider]  SURE COMFORT INS SYR 1CC/30G 30G X 5/16" 1 ML MISC 1 each by Other route daily. 01/25/20   [provider]      Allergies    Amphetamine-dextroamphetamine, Nitrofuran derivatives, Amphetamine-dextroamphet er, Pregabalin, Topiramate, and Verelan [verapamil]    Review of Systems   Review of Systems  HENT:  Negative for congestion.   Respiratory:  Positive for shortness of breath. Negative for cough.   Cardiovascular:  Positive  for chest pain. Negative for palpitations and leg swelling.  Gastrointestinal:  Positive for abdominal distention and abdominal pain. Negative for nausea and vomiting.  Genitourinary:  Negative for dysuria.  Neurological:  Negative for syncope and light-headedness.  All other systems reviewed and are negative.   Physical Exam Updated Vital Signs BP (!) 147/67   Pulse (!) 101   Temp (!) 102.7 F (39.3 C) (Oral)   Resp 18   Ht 5\' 4"  (1.626 m)   Wt 113.4 kg   SpO2 90%   BMI 42.91 kg/m  Physical Exam Vitals and nursing note reviewed.  Constitutional:      General: She is not in acute distress.    Appearance: Normal appearance. She is not ill-appearing.  HENT:     Head: Normocephalic and atraumatic.     Nose: Nose normal.  Eyes:     General: No scleral icterus.    Extraocular Movements: Extraocular movements intact.     Conjunctiva/sclera: Conjunctivae normal.  Cardiovascular:     Rate and Rhythm: Regular rhythm. Tachycardia present.     Pulses: Normal pulses.  Pulmonary:     Effort: Pulmonary effort is normal. No respiratory distress.     Breath sounds: Normal breath sounds. No wheezing or rales.  Abdominal:     General: There is distension.     Tenderness: There is abdominal tenderness.   Musculoskeletal:        General: Normal range of motion.     Cervical back: Normal range of motion.     Right lower leg: No edema.     Left lower leg: No edema.  Skin:    General: Skin is warm and dry.  Neurological:     General: No focal deficit present.     Mental Status: She is alert. Mental status is at baseline.     ED Results / Procedures / Treatments   Labs (all labs ordered are listed, but only abnormal results are displayed) Labs Reviewed  CULTURE, BLOOD (ROUTINE X 2)  CULTURE, BLOOD (ROUTINE X 2)  RESP PANEL BY RT-PCR (RSV, FLU A&B, COVID)  RVPGX2  CBC WITH DIFFERENTIAL/PLATELET  PROTIME-INR  LACTIC ACID, PLASMA  LACTIC ACID, PLASMA  URINALYSIS, ROUTINE W REFLEX MICROSCOPIC  COMPREHENSIVE METABOLIC PANEL  BRAIN NATRIURETIC PEPTIDE  LIPASE, BLOOD  TROPONIN I (HIGH SENSITIVITY)    EKG EKG Interpretation  Date/Time:  Thursday July 10 2022 13:02:36 EDT Ventricular Rate:  101 PR Interval:  197 QRS Duration: 89 QT Interval:  352 QTC Calculation: 457 R Axis:   17 Text Interpretation: Sinus tachycardia Probable anteroseptal infarct, old No acute changes No significant change since last tracing Confirmed by Derwood Kaplan [phone]) on 07/10/2022 1:26:33 PM  Radiology DG Chest Portable 1 View  Result Date: 07/10/2022 CLINICAL DATA:  Chest pain. EXAM: PORTABLE CHEST 1 VIEW COMPARISON:  None Available. FINDINGS: Similar mild enlargement the cardiac silhouette. No consolidation. No visible pleural effusions or pneumothorax. Polyarticular degenerative change. IMPRESSION: No active disease.  Similar cardiomegaly. Electronically Signed   By: Feliberto Harts M.D.   On: 07/10/2022 13:16    Procedures .Critical Care  Performed by: Marita Kansas, PA-C Authorized by: Marita Kansas, PA-C   Critical care provider statement:    Critical care time (minutes):  31   Critical care was necessary to treat or prevent imminent or life-threatening deterioration of the following  conditions:  Sepsis   Critical care was time spent personally by me on the following activities:  Development of treatment plan with  patient or surrogate, discussions with consultants, evaluation of patient's response to treatment, examination of patient, ordering and review of laboratory studies, ordering and review of radiographic studies, ordering and performing treatments and interventions, pulse oximetry, re-evaluation of patient's condition and review of old charts   Care discussed with: admitting provider       Medications Ordered in ED Medications  nitroGLYCERIN (NITROGLYN) 2 % ointment 1 inch (has no administration in time range)  acetaminophen (TYLENOL) tablet 1,000 mg (has no administration in time range)  ceFEPIme (MAXIPIME) 2 g in sodium chloride 0.9 % 100 mL IVPB (has no administration in time range)  metroNIDAZOLE (FLAGYL) IVPB 500 mg (has no administration in time range)  vancomycin (VANCOCIN) IVPB 1000 mg/200 mL premix (has no administration in time range)    ED Course/ Medical Decision Making/ A&P                             Medical Decision Making Amount and/or Complexity of Data Reviewed Labs: ordered. Radiology: ordered.  Risk OTC drugs. Prescription drug management. Decision regarding hospitalization.   Medical Decision Making / ED Course   This patient presents to the ED for concern of chest pain, abdominal pain, fever this involves an extensive number of treatment options, and is a complaint that carries with it a high risk of complications and morbidity.  The differential diagnosis includes ACS, pneumonia, PE, intra-abdominal infection, sepsis, UTI  MDM: 72 year old female presents with above-mentioned complaints.  She does meet SIRS criteria upon arrival.  No apparent source.  Will obtain ACS workup as well as initiate sepsis workup.  Will limit large volume resuscitation given she has history of CHF.  Chest x-ray without acute cardiopulmonary process.   EKG without acute ischemic changes.  Will obtain remainder of the blood work as well as CT imaging.  PE study ordered.  She does endorse history of blood clots.  Primarily in the lower extremities.  I see no documented history of this.  Workup reveals leukocytosis on CBC with some left shift.  Mild anemia.  No other acute concerns.  CMP with preserved renal function, glucose 220 otherwise without acute findings.  UA does show evidence of UTI.  She was initially given broad-spectrum antibiotics.  However her IV got dislodged.  Unsure of how much vancomycin she has received.  She has received cefepime already which should be sufficient to cover her source.  Initially had lactic acidosis of 2.0.  Cleared after 500 mL fluid bolus.  Troponin negative x 2.  COVID, flu, RSV negative.  Urine culture ordered.  Blood cultures collected.  Discussed with hospitalist will evaluate patient for admission.   She does have collaterals which indicate potential subclavian occlusion.  However she states this was mentioned to her before.  This is not new.  Chest pain improved from 8/10 upon arrival to 2/10 at the time of admission.   Lab Tests: -I ordered, reviewed, and interpreted labs.   The pertinent results include:   Labs Reviewed  CULTURE, BLOOD (ROUTINE X 2)  CULTURE, BLOOD (ROUTINE X 2)  RESP PANEL BY RT-PCR (RSV, FLU A&B, COVID)  RVPGX2  CBC WITH DIFFERENTIAL/PLATELET  PROTIME-INR  LACTIC ACID, PLASMA  LACTIC ACID, PLASMA  URINALYSIS, ROUTINE W REFLEX MICROSCOPIC  COMPREHENSIVE METABOLIC PANEL  BRAIN NATRIURETIC PEPTIDE  LIPASE, BLOOD  TROPONIN I (HIGH SENSITIVITY)      EKG  EKG Interpretation  Date/Time:  Thursday July 10 2022 13:02:36 EDT  Ventricular Rate:  101 PR Interval:  197 QRS Duration: 89 QT Interval:  352 QTC Calculation: 457 R Axis:   17 Text Interpretation: Sinus tachycardia Probable anteroseptal infarct, old No acute changes No significant change since last tracing Confirmed  by Derwood Kaplan [phone]) on 07/10/2022 1:26:33 PM         Imaging Studies ordered: I ordered imaging studies including CTA chest PE study, CT abdomen pelvis with contrast, chest x-ray I independently visualized and interpreted imaging. I agree with the radiologist interpretation   Medicines ordered and prescription drug management: Meds ordered this encounter  Medications   nitroGLYCERIN (NITROGLYN) 2 % ointment 1 inch   acetaminophen (TYLENOL) tablet 1,000 mg   ceFEPIme (MAXIPIME) 2 g in sodium chloride 0.9 % 100 mL IVPB    Order Specific Question:   Antibiotic Indication:    Answer:   Other Indication (list below)    Order Specific Question:   Other Indication:    Answer:   Unknown source   metroNIDAZOLE (FLAGYL) IVPB 500 mg    Order Specific Question:   Antibiotic Indication:    Answer:   Other Indication (list below)    Order Specific Question:   Other Indication:    Answer:   Unknown source   DISCONTD: vancomycin (VANCOCIN) IVPB 1000 mg/200 mL premix    Order Specific Question:   Indication:    Answer:   Other Indication (list below)    Order Specific Question:   Other Indication:    Answer:   Unknown source   vancomycin (VANCOREADY) IVPB 2000 mg/400 mL    Order Specific Question:   Indication:    Answer:   Other Indication (list below)   aspirin EC tablet 243 mg    -I have reviewed the patients home medicines and have made adjustments as needed  Critical interventions Broad-spectrum antibiotics, sepsis workup, 500 mill fluid bolus for lactic acidosis   Reevaluation: After the interventions noted above, I reevaluated the patient and found that they have :improved  Co morbidities that complicate the patient evaluation  Past Medical History:  Diagnosis Date   Anxiety and depression    Arthritis    CAD (coronary artery disease)    Stent placement circumflex coronary 2007, catheterization 2008 patent stents. 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  Final Clinical Impression(s) / ED Diagnoses Final diagnoses:  None    Rx / DC Orders ED Discharge Orders     None         Marita Kansas, PA-C 07/10/22 1823    Derwood Kaplan, MD 07/11/22 1001

## 2022-07-10 NOTE — Progress Notes (Signed)
Pharmacy Antibiotic Note  [name] is a 72 y.o. female admitted on 07/10/2022 with  unknown source of infection .  Pharmacy has been consulted for vancomycin and cefepime dosing.  Plan: Vancomycin 2000 mg IV x 1 dose. Vancomycin 1250 mg IV every 24 hours. Cefepime 2000 mg IV every 8 hours. Monitor labs, c/s, and vanco levels as indicated.  Height:  (162.6 cm) Weight: 113.4 kg (250 lb) IBW/kg (Calculated) : 54.7  Temp (24hrs), Avg:102.7 F (39.3 C), Min:102.7 F (39.3 C), Max:102.7 F (39.3 C)  Recent Labs  Lab 07/10/22 1345  WBC 14.9*  CREATININE 0.95    Estimated Creatinine Clearance: 69 mL/min (by C-G formula based on SCr of 0.95 mg/dL).    Allergies  Allergen Reactions   Amphetamine-Dextroamphetamine Swelling   Nitrofuran Derivatives Itching and Swelling   Amphetamine-Dextroamphet Er Swelling   Pregabalin Swelling   Topiramate Other (See Comments)    Tongue tingle   Verelan [Verapamil] Rash    Antimicrobials this admission: Vanco 4/25 >> Cefepime 4/25 >>  Microbiology results: 4/25 BCx: pending   Thank you for allowing pharmacy to be a part of this patient's care.  Judeth Cornfield, PharmD Clinical Pharmacist 07/10/2022 2:37 PM

## 2022-07-10 NOTE — H&P (Signed)
TRH H&P   Patient Demographics:    [name], is a 72 y.o. female  MRN: [id number]   DOB - 11-23-1950  Admit Date - 07/10/2022  Outpatient Primary MD for the patient is Ignatius Specking, MD  Referring MD/NP/PA: Doneta Public    Patient coming from: home   Chief Complaint  Patient presents with   Chest Pain      HPI:    [name]  is a 72 y.o. female,  with medical history significant for diabetes mellitus, OSA, hypertension, diastolic CHF, coronary artery disease.  Admission last February for TIA/CVA, as well family reports she had an admission at [facility] last month for UTI, as well she had a visit to ED in ED yesterday for altered mental status with negative MRI brain. -Patient presents to ED secondary to complaints of chest pain, since last night, that has persisted till this morning, did not improve after receiving her nitro, she was chest pain-free upon my evaluation, she is very poor historian, she did have some fever for last couple days, but denies any upper respiratory symptoms, no dysuria, no polyuria, she does report abdominal pain as well started yesterday, no vomitin. -In ED she was noted to have fever 102.7, lactic acid elevated at 2, leukocytosis at 14.9, CT abdomen pelvis significant for left pyelonephritis, troponins negative x 2, [facility] requested to admit   Review of systems:     A full 10 point Review of Systems was done, except as stated above, all other Review of Systems were negative.   With Past History of the following :    Past Medical History:  Diagnosis Date   Anxiety and depression    Arthritis    CAD (coronary artery disease)    Stent placement circumflex coronary 2007, catheterization 2008 patent stents. 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    High cholesterol    Hx of blood clots    Hypertension    MI (myocardial infarction) (HCC)    2007   Obstructive sleep apnea    CPAP, setting ?2   Palpitations    Sleep apnea    Tachycardia       Past Surgical History:  Procedure Laterality Date   ABDOMINAL HYSTERECTOMY     Arm Surgery Bilateral    due to fall-pt broke both forearms   BIOPSY N/A 07/01/2013   Procedure: BIOPSY;  Surgeon: Malissa Hippo,  MD;  Location: [facility];  Service: Endoscopy;  Laterality: N/A;   CHOLECYSTECTOMY     COLONOSCOPY  08/06/2011   Procedure: COLONOSCOPY;  Surgeon: Malissa Hippo, MD;  Location: [facility];  Service: Endoscopy;  Laterality: N/A;  215   COLONOSCOPY WITH PROPOFOL N/A 12/31/2012   Procedure: COLONOSCOPY WITH PROPOFOL;  Surgeon: Malissa Hippo, MD;  Location: [facility];  Service: Endoscopy;  Laterality: N/A;  in cecum at 0814, total withdrawal time   COLONOSCOPY WITH PROPOFOL N/A 02/05/2018   Procedure: COLONOSCOPY WITH PROPOFOL;  Surgeon: Malissa Hippo, MD;  [facility]: [facility];  Service: Endoscopy;  Laterality: N/A;  1:25   CORONARY ANGIOPLASTY WITH STENT PLACEMENT     CYSTOSCOPY W/ URETERAL STENT PLACEMENT Right 11/10/2019   at [facility]   CYSTOSCOPY WITH RETROGRADE PYELOGRAM, URETEROSCOPY AND STENT PLACEMENT Right 12/22/2019   Procedure: CYSTOSCOPY WITH RIGHT URETERAL STENT REMOVAL; RIGHT RETROGRADE PYELOGRAM,RIGHT URETEROSCOPY;  Surgeon: Malen Gauze, MD;  Location: [facility];  Service: Urology;  Laterality: Right;   ESOPHAGEAL DILATION N/A 06/07/2015   Procedure: ESOPHAGEAL DILATION;  Surgeon: Malissa Hippo, MD;  [facility];  Service: Endoscopy;  Laterality: N/A;   ESOPHAGOGASTRODUODENOSCOPY N/A  06/07/2015   Procedure: ESOPHAGOGASTRODUODENOSCOPY (EGD);  Surgeon: Malissa Hippo, MD;  [facility];  Service: Endoscopy;  Laterality: N/A;  2:45 - moved to 1:00 - Ann to notify pt   ESOPHAGOGASTRODUODENOSCOPY (EGD) WITH PROPOFOL N/A 12/31/2012   Procedure: ESOPHAGOGASTRODUODENOSCOPY (EGD) WITH PROPOFOL;  Surgeon: Malissa Hippo, MD;  Location: [facility];  Service: Endoscopy;  Laterality: N/A;  GE junction 37,   ESOPHAGOGASTRODUODENOSCOPY (EGD) WITH PROPOFOL N/A 07/01/2013   Procedure: ESOPHAGOGASTRODUODENOSCOPY (EGD) WITH PROPOFOL;  Surgeon: Malissa Hippo, MD;  Location: [facility];  Service: Endoscopy;  Laterality: N/A;  950   MALONEY DILATION N/A 12/31/2012   Procedure: MALONEY DILATION;  Surgeon: Malissa Hippo, MD;  Location: [facility];  Service: Endoscopy;  Laterality: N/A;  used a # 54,#56   MALONEY DILATION N/A 07/01/2013   Procedure: MALONEY DILATION;  Surgeon: Malissa Hippo, MD;  Location: [facility];  Service: Endoscopy;  Laterality: N/A;  54/58; no heme present   POLYPECTOMY  02/05/2018   Procedure: POLYPECTOMY;  Surgeon: Malissa Hippo, MD;  [facility];  Service: Endoscopy;;  distal rectum (HS)   STONE EXTRACTION WITH BASKET Right 12/22/2019   Procedure: STONE EXTRACTION WITH BASKET;  Surgeon: Malen Gauze, MD;  Location: [facility];  Service: Urology;  Laterality: Right;      Social History:     Social History   Tobacco Use   Smoking status: Never   Smokeless tobacco: Never  Substance Use Topics   Alcohol use: No    Alcohol/week: 0.0 standard drinks of alcohol       Family History :     Family History  Problem Relation Age of Onset   Heart attack Mother    Colon cancer Mother    Heart attack Father    Stroke Sister    Hemophilia Child    Cancer Other    Coronary artery disease Other    Cancer Brother    Cancer Maternal Aunt    Cancer Maternal Uncle    Cancer Paternal Aunt    Cancer Paternal Uncle    Cancer Brother       Home  Medications:   Prior to Admission medications   Medication Sig Start Date End Date Taking? Authorizing Provider  acetaminophen (TYLENOL) 325 MG tablet Take 2 tablets (650 mg total) by mouth every 4 (four) hours as needed for mild pain (or temp > 37.5 C (99.5 F)). 05/12/22  Yes Shon Hale, MD  ALPRAZolam Prudy Feeler) 0.5 MG tablet Take 1 tablet (0.5 mg total) by mouth 2 (two) times daily as needed for anxiety or sleep. 05/12/22  Yes Shon Hale, MD  aspirin EC 81 MG tablet Take 1 tablet (81 mg total) by mouth daily with breakfast. -Take Aspirin 81 mg daily along with Plavix 75 mg daily for 21 days then after that STOP the Plavix  and continue ONLY Aspirin 81 mg daily indefinitely-- 05/12/22  Yes Emokpae, Courage, MD  atorvastatin (LIPITOR) 40 MG tablet Take 40 mg by mouth daily. 06/25/22  Yes [provider]  atorvastatin (LIPITOR) 80 MG tablet Take 0.5 tablets (40 mg total) by mouth daily. 05/12/22  Yes Shon Hale, MD  buPROPion ER (WELLBUTRIN SR) 100 MG 12 hr tablet Take 1 tablet (100 mg total) by mouth daily. 05/12/22  Yes Emokpae, Courage, MD  Cholecalciferol (VITAMIN D3) 125 MCG (5000 UT) CAPS TAKE ONE CAPSULE BY MOUTH DAILY Patient taking differently: Take 5,000 Units by mouth daily. 04/04/19  Yes Nida, Denman George, MD  diclofenac Sodium (VOLTAREN) 1 % GEL Apply topically. 09/23/19  Yes [provider]  doxepin (SINEQUAN) 25 MG capsule Take 75 mg by mouth at bedtime.  09/23/19  Yes [provider]  ferrous sulfate 325 (65 FE) MG EC tablet Take by mouth. 06/20/21 07/10/22 Yes [provider]  furosemide (LASIX) 40 MG tablet Take 1.5 tablets (60 mg total) by mouth daily. 07/11/21  Yes Branch, Dorothe Pea, MD  gabapentin (NEURONTIN) 400 MG capsule Take 600 mg by mouth 3 (three) times daily. 11/24/16  Yes [provider]  glipiZIDE (GLUCOTROL XL) 5 MG 24 hr tablet Take 5 mg by mouth daily. 06/25/22  Yes [provider]  Insulin Lispro Prot &  Lispro (HUMALOG 75/25 MIX) (75-25) 100 UNIT/ML Kwikpen Inject 90 Units into the skin 2 (two) times daily with a meal. 05/12/22  Yes Emokpae, Courage, MD  isosorbide mononitrate (IMDUR) 30 MG 24 hr tablet TAKE 1/2 TABLET BY MOUTH DAILY (Need TO make APPOINTMENT FOR NEXT refills) Patient taking differently: Take 30 mg by mouth daily. Take 1/2 tablet daily 05/27/22  Yes Branch, Dorothe Pea, MD  lidocaine (XYLOCAINE) 5 % ointment Apply topically 2 (two) times daily. 01/17/22  Yes [provider]  meloxicam (MOBIC) 7.5 MG tablet Take 7.5 mg by mouth daily. 09/26/21  Yes [provider]  metFORMIN (GLUCOPHAGE) 1000 MG tablet Take 1 tablet (1,000 mg total) by mouth 2 (two) times daily with a meal. 05/12/22  Yes Emokpae, Courage, MD  methocarbamol (ROBAXIN) 500 MG tablet Take 1 tablet (500 mg total) by mouth 2 (two) times daily. 05/12/22  Yes Shon Hale, MD  metoCLOPramide (REGLAN) 5 MG tablet Take 5 mg by mouth 3 (three) times daily as needed for nausea or vomiting.  11/09/17  Yes [provider]  metoprolol succinate (TOPROL-XL) 100 MG 24 hr tablet Take 100 mg by mouth daily. Take with or immediately following a meal.   Yes [provider]  montelukast (SINGULAIR) 10 MG tablet Take 10 mg by mouth at bedtime.  09/23/19  Yes [provider]  Multiple Vitamin (MULTIVITAMIN) capsule Take 1 capsule by mouth daily.   Yes [provider]  naloxone Jonelle Sports) nasal spray 4 mg/0.1 mL SMARTSIG:Both Nares 02/25/22  Yes [provider]  nitroGLYCERIN (NITROSTAT) 0.4 MG SL tablet Place 1 tablet (0.4 mg total) under the tongue every 5 (five) minutes as needed for chest pain (up to 3 doses. If taking 3rd dose call 911). 08/08/20  Yes Dunn, Dayna N, PA-C  ondansetron (ZOFRAN) 4 MG tablet Take 4 mg by mouth every other day.   Yes [provider]  oxyCODONE-acetaminophen (PERCOCET) 10-325 MG tablet Take 1 tablet by mouth every 6 (six) hours as needed for pain.  07/09/22  Yes Jones Bales, NP  pantoprazole (PROTONIX) 40 MG tablet TAKE 1 TABLET BY MOUTH TWICE DAILY BEFORE MEALS Patient taking differently: Take 40 mg by mouth 2 (two) times daily before a meal. 06/29/19  Yes Rehman, Joline Maxcy, MD  polyethylene glycol (MIRALAX / GLYCOLAX) 17 g packet Take 17 g by mouth daily as needed for moderate constipation.   Yes [provider]  potassium chloride (KLOR-CON) 10 MEQ tablet Take 10 mEq by mouth 2 (two) times daily. 06/25/22  Yes [provider]  PROAIR HFA 108 (90 Base) MCG/ACT inhaler Inhale 2 puffs into the lungs every 4 (four) hours as needed for wheezing or shortness of breath.  11/24/16  Yes [provider]  promethazine (PHENERGAN) 25 MG tablet Take 1 tablet (25 mg total) by mouth 4 (four) times daily as needed. Patient taking differently: Take 25 mg by mouth 4 (four) times daily as needed for nausea or vomiting. 12/29/19  Yes McKenzie, Mardene Celeste, MD  senna-docusate (SENOKOT-S) 8.6-50 MG tablet Take 2 tablets by mouth at bedtime. 05/12/22  Yes Emokpae, Courage, MD  SSD 1 % cream Apply topically 2 (two) times daily. 12/12/21  Yes [provider]  B-D ULTRAFINE III SHORT PEN 31G X 8 MM MISC Inject into the skin 4 (four) times daily. 08/26/19   [provider]  Blood Glucose Monitoring Suppl (ACCU-CHEK GUIDE ME) w/Device KIT 1 Piece by Does not apply route as directed. 01/27/22   Roma Kayser, MD  clopidogrel (PLAVIX) 75 MG tablet Take 1 tablet (75 mg total) by mouth daily. -Take Aspirin 81 mg daily along with Plavix 75 mg daily for 21 days then after that STOP the Plavix  and continue ONLY Aspirin 81 mg daily indefinitely-- Patient not taking: Reported on 07/10/2022 05/13/22   Shon Hale, MD  lisinopril (ZESTRIL) 5 MG tablet TAKE 1 TABLET BY MOUTH DAILY (Make APPOINTMENT FOR NEXT refills) Patient not taking: Reported on 07/10/2022 05/27/22   Antoine Poche, MD  nitrofurantoin (MACRODANTIN) 50 MG  capsule Take 50 mg by mouth daily. Patient not taking: Reported on 07/10/2022 06/11/22   [provider]  SURE COMFORT INS SYR 1CC/30G 30G X 5/16" 1 ML MISC 1 each by Other route daily. 01/25/20   [provider]     Allergies:     Allergies  Allergen Reactions   Amphetamine-Dextroamphetamine Swelling   Nitrofuran Derivatives Itching and Swelling   Amphetamine-Dextroamphet Er Swelling   Pregabalin Swelling   Topiramate Other (See Comments)    Tongue tingle   Verelan [Verapamil] Rash     Physical Exam:   Vitals  Blood pressure 120/63, pulse 96, temperature 99.5 F (37.5 C), temperature source Oral, resp. rate 20, height 5\' 4"  (1.626 m), weight 113.4 kg, SpO2 94 %.   1. General frail elderly female, lying in bed in NAD,    2.  Impaired judgment and insight, oriented x 2  3. No F.N deficits, ALL C.Nerves Intact, Strength 5/5 all 4 extremities, Sensation intact all 4  extremities, Plantars down going.  4. Ears and Eyes appear Normal, Conjunctivae clear, PERRLA. Moist Oral Mucosa.  5. Supple Neck, No JVD, No cervical lymphadenopathy appriciated, No Carotid Bruits.  6. Symmetrical Chest wall movement, Good air movement bilaterally, CTAB.  7. RRR, No Gallops, Rubs or Murmurs, No Parasternal Heave.  8. Positive Bowel Sounds, Abdomen Soft, close CVA tenderness, but much more significant on the left side  9.  No Cyanosis, Normal Skin Turgor, No Skin Rash or Bruise.  10. Good muscle tone,  joints appear normal , no effusions, Normal ROM.    Data Review:    CBC Recent Labs  Lab 07/10/22 1345  WBC 14.9*  HGB 11.8*  HCT 37.7  PLT 349  MCV 84.3  MCH 26.4  MCHC 31.3  RDW 15.4  LYMPHSABS 2.1  MONOABS 1.6*  EOSABS 0.0  BASOSABS 0.1   ------------------------------------------------------------------------------------------------------------------  Chemistries  Recent Labs  Lab 07/10/22 1345  NA 135  K 3.8  CL 96*  CO2 32  GLUCOSE 220*  BUN 16   CREATININE 0.95  CALCIUM 8.5*  AST 14*  ALT 15  ALKPHOS 69  BILITOT 1.1   ------------------------------------------------------------------------------------------------------------------ estimated creatinine clearance is 69 mL/min (by C-G formula based on SCr of 0.95 mg/dL). ------------------------------------------------------------------------------------------------------------------ No results for input(s): "TSH", "T4TOTAL", "T3FREE", "THYROIDAB" in the last 72 hours.  Invalid input(s): "FREET3"  Coagulation profile Recent Labs  Lab 07/10/22 1345  INR 1.0   ------------------------------------------------------------------------------------------------------------------- No results for input(s): "DDIMER" in the last 72 hours. -------------------------------------------------------------------------------------------------------------------  Cardiac Enzymes No results for input(s): "CKMB", "TROPONINI", "MYOGLOBIN" in the last 168 hours.  Invalid input(s): "CK" ------------------------------------------------------------------------------------------------------------------    Component Value Date/Time   BNP 29.0 07/10/2022 1350     ---------------------------------------------------------------------------------------------------------------  Urinalysis    Component Value Date/Time   COLORURINE YELLOW 07/10/2022 1620   APPEARANCEUR HAZY (A) 07/10/2022 1620   APPEARANCEUR Cloudy (A) 11/23/2019 1038   LABSPEC 1.014 07/10/2022 1620   PHURINE 5.0 07/10/2022 1620   GLUCOSEU 50 (A) 07/10/2022 1620   HGBUR SMALL (A) 07/10/2022 1620   BILIRUBINUR NEGATIVE 07/10/2022 1620   BILIRUBINUR Negative 11/23/2019 1038   KETONESUR NEGATIVE 07/10/2022 1620   PROTEINUR NEGATIVE 07/10/2022 1620   UROBILINOGEN 0.2 12/02/2012 1520   NITRITE POSITIVE (A) 07/10/2022 1620   LEUKOCYTESUR MODERATE (A) 07/10/2022 1620     ----------------------------------------------------------------------------------------------------------------   Imaging Results:    CT Angio Chest PE W and/or Wo Contrast  Result Date: 07/10/2022 CLINICAL DATA:  Chest and abdominal pain. EXAM: CT ANGIOGRAPHY CHEST CT ABDOMEN AND PELVIS WITH CONTRAST TECHNIQUE: Multidetector CT imaging of the chest was performed using the standard protocol during bolus administration of intravenous contrast. Multiplanar CT image reconstructions and MIPs were obtained to evaluate the vascular anatomy. Multidetector CT imaging of the abdomen and pelvis was performed using the standard protocol during bolus administration of intravenous contrast. RADIATION DOSE REDUCTION: This exam was performed according to the departmental dose-optimization program which includes automated exposure control, adjustment of the mA and/or kV according to patient size and/or use of iterative reconstruction technique. CONTRAST:  OMNIPAQUE IOHEXOL 350 MG/ML SOLN COMPARISON:  CT abdomen/pelvis 05/09/2022 FINDINGS: CTA CHEST FINDINGS Cardiovascular: Extensive left-sided chest wall collateral vessels are noted. Suspect significant stenosis or partial occlusion of the left subclavian vein. The left brachiocephalic vein is normal. The heart is normal in size. No pericardial effusion. Prominent pericardial and epicardial fat. The aorta is normal in caliber. No dissection. Scattered atherosclerotic calcifications. The branch vessels are patent. The pulmonary arterial tree is suboptimally  opacified but no findings to suggest pulmonary embolism. Mediastinum/Nodes: Small scattered mediastinal and hilar lymph nodes but no mass or adenopathy. The esophagus is unremarkable. The thyroid gland is unremarkable. Lungs/Pleura: No significant pulmonary findings. No infiltrates, edema or effusions. Streaky areas of basilar scarring change in atelectasis. No worrisome pulmonary lesions. No pneumothorax.  Musculoskeletal: No breast masses, supraclavicular or axillary adenopathy. The bony thorax is intact. Review of the MIP images confirms the above findings. CT ABDOMEN and PELVIS FINDINGS Hepatobiliary: Diffuse and fairly marked fatty infiltration of the liver but no focal hepatic lesions or intrahepatic biliary dilatation. Gallbladder is surgically absent. Mild associated common bile duct dilatation. The portal and hepatic veins are patent. Pancreas: No mass, inflammation or ductal dilatation. Spleen: Normal size.  No focal lesions. Adrenals/Urinary Tract: Stable low-attenuation left adrenal gland nodule measuring 22 mm. This measured 13 Hounsfield units on a prior noncontrast abdominal CT scan from 2021 and is consistent with a benign adenoma. No further imaging evaluation or follow-up is necessary. The right adrenal gland is normal. Few patchy areas of decreased perfusion involving the left kidney not present on the prior CT scan and suspicious for pyelonephritis. Mild renal cortical scarring changes involving the right kidney. No renal calculi or hydroureteronephrosis. The bladder is unremarkable. Stomach/Bowel: The stomach, duodenum, small and colon grossly normal. No acute inflammatory process, mass lesions obstructive findings. Vascular/Lymphatic: Moderate atherosclerotic calcifications involving the aorta and branch vessels but no aneurysm or dissection. The major venous structures are patent. No mesenteric or retroperitoneal mass or adenopathy. Reproductive: Surgically absent. The uterus is surgically absent. Both ovaries are still present appear. Other: Marked diffuse bulging and thinning of the anterior abdominal wall without discrete hernia. Musculoskeletal: Significant thoracolumbar scoliosis and degenerative changes. No acute bony findings. 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 Review of the MIP images confirms the above findings. IMPRESSION: 1. Normal caliber thoracic aorta. No aneurysm or  dissection. Scattered aortic and coronary artery calcifications. 2. No acute pulmonary findings. 3. Extensive left-sided chest wall collateral vessels. Suspect significant stenosis or partial occlusion of the left subclavian vein. 4. Few patchy areas of decreased perfusion involving the left kidney not present on the prior CT scan and suspicious for pyelonephritis. Recommend correlation with urinalysis. 5. Diffuse and fairly marked fatty infiltration of the liver. 6. Status post cholecystectomy with mild associated common bile duct dilatation. 7. Stable 22 mm benign left adrenal gland adenoma. 8. Marked diffuse bulging and thinning of the anterior abdominal wall without discrete hernia. 9. 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 10. Aortic atherosclerosis. Aortic Atherosclerosis (ICD10-I70.0). Electronically Signed   By: Rudie Meyer M.D.   On: 07/10/2022 16:45   CT ABDOMEN PELVIS W CONTRAST  Result Date: 07/10/2022 CLINICAL DATA:  Chest and abdominal pain. EXAM: CT ANGIOGRAPHY CHEST CT ABDOMEN AND PELVIS WITH CONTRAST TECHNIQUE: Multidetector CT imaging of the chest was performed using the standard protocol during bolus administration of intravenous contrast. Multiplanar CT image reconstructions and MIPs were obtained to evaluate the vascular anatomy. Multidetector CT imaging of the abdomen and pelvis was performed using the standard protocol during bolus administration of intravenous contrast. RADIATION DOSE REDUCTION: This exam was performed according to the departmental dose-optimization program which includes automated exposure control, adjustment of the mA and/or kV according to patient size and/or use of iterative reconstruction technique. CONTRAST:  OMNIPAQUE IOHEXOL 350 MG/ML SOLN COMPARISON:  CT abdomen/pelvis 05/09/2022 FINDINGS: CTA CHEST FINDINGS Cardiovascular: Extensive left-sided chest wall collateral vessels are noted. Suspect significant stenosis  or partial occlusion of the left  subclavian vein. The left brachiocephalic vein is normal. The heart is normal in size. No pericardial effusion. Prominent pericardial and epicardial fat. The aorta is normal in caliber. No dissection. Scattered atherosclerotic calcifications. The branch vessels are patent. The pulmonary arterial tree is suboptimally opacified but no findings to suggest pulmonary embolism. Mediastinum/Nodes: Small scattered mediastinal and hilar lymph nodes but no mass or adenopathy. The esophagus is unremarkable. The thyroid gland is unremarkable. Lungs/Pleura: No significant pulmonary findings. No infiltrates, edema or effusions. Streaky areas of basilar scarring change in atelectasis. No worrisome pulmonary lesions. No pneumothorax. Musculoskeletal: No breast masses, supraclavicular or axillary adenopathy. The bony thorax is intact. Review of the MIP images confirms the above findings. CT ABDOMEN and PELVIS FINDINGS Hepatobiliary: Diffuse and fairly marked fatty infiltration of the liver but no focal hepatic lesions or intrahepatic biliary dilatation. Gallbladder is surgically absent. Mild associated common bile duct dilatation. The portal and hepatic veins are patent. Pancreas: No mass, inflammation or ductal dilatation. Spleen: Normal size.  No focal lesions. Adrenals/Urinary Tract: Stable low-attenuation left adrenal gland nodule measuring 22 mm. This measured 13 Hounsfield units on a prior noncontrast abdominal CT scan from 2021 and is consistent with a benign adenoma. No further imaging evaluation or follow-up is necessary. The right adrenal gland is normal. Few patchy areas of decreased perfusion involving the left kidney not present on the prior CT scan and suspicious for pyelonephritis. Mild renal cortical scarring changes involving the right kidney. No renal calculi or hydroureteronephrosis. The bladder is unremarkable. Stomach/Bowel: The stomach, duodenum, small and colon grossly normal. No acute inflammatory process,  mass lesions obstructive findings. Vascular/Lymphatic: Moderate atherosclerotic calcifications involving the aorta and branch vessels but no aneurysm or dissection. The major venous structures are patent. No mesenteric or retroperitoneal mass or adenopathy. Reproductive: Surgically absent. The uterus is surgically absent. Both ovaries are still present appear. Other: Marked diffuse bulging and thinning of the anterior abdominal wall without discrete hernia. Musculoskeletal: Significant thoracolumbar scoliosis and degenerative changes. No acute bony findings. 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 Review of the MIP images confirms the above findings. IMPRESSION: 1. Normal caliber thoracic aorta. No aneurysm or dissection. Scattered aortic and coronary artery calcifications. 2. No acute pulmonary findings. 3. Extensive left-sided chest wall collateral vessels. Suspect significant stenosis or partial occlusion of the left subclavian vein. 4. Few patchy areas of decreased perfusion involving the left kidney not present on the prior CT scan and suspicious for pyelonephritis. Recommend correlation with urinalysis. 5. Diffuse and fairly marked fatty infiltration of the liver. 6. Status post cholecystectomy with mild associated common bile duct dilatation. 7. Stable 22 mm benign left adrenal gland adenoma. 8. Marked diffuse bulging and thinning of the anterior abdominal wall without discrete hernia. 9. 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 10. Aortic atherosclerosis. Aortic Atherosclerosis (ICD10-I70.0). Electronically Signed   By: Rudie Meyer M.D.   On: 07/10/2022 16:45   DG Chest Portable 1 View  Result Date: 07/10/2022 CLINICAL DATA:  Chest pain. EXAM: PORTABLE CHEST 1 VIEW COMPARISON:  None Available. FINDINGS: Similar mild enlargement the cardiac silhouette. No consolidation. No visible pleural effusions or pneumothorax. Polyarticular degenerative change. IMPRESSION: No active disease.  Similar  cardiomegaly. Electronically Signed   By: Feliberto Harts M.D.   On: 07/10/2022 13:16    EKG:  Vent. rate 101 BPM PR interval 197 ms QRS duration 89 ms QT/QTcB 352/457 ms P-R-T axes 30 17 75 Sinus tachycardia Probable  anteroseptal infarct, old  Assessment & Plan:    Principal Problem:   Sepsis (HCC) Active Problems:   CAD (coronary artery disease)   Obstructive sleep apnea   Chronic diastolic heart failure (HCC)   CHEST PAIN-UNSPECIFIED   Anxiety and depression   Morbid obesity (HCC)   Essential hypertension, benign   Uncontrolled type 2 diabetes mellitus with hyperglycemia    Sepsis secondary to left pyelonephritis -Sepsis present on admission,fever 102.7, lactic acid elevated at 2, leukocytosis at 14.9, -CT abdomen pelvis significant for with left pyonephritis -Delirium analysis -Follow-up blood cultures. -Received IV cefepime initially, will narrow to IV Rocephin given recent urine culture 05/09/2022 showing pansensitive E. Coli   Chest pain -Nontypical features, currently chest pain-free, troponins negative x 2, EKG nonacute  History of CVA/TIA -  continue with aspirin statin, beta-blockers, and Imdur.  CAD -Continue with aspirin, statin, Imdur and Toprol-XL  Essential hypertension, benign Blood pressure acceptable, continue with metoprolol, Imdur and resume lisinopril once more stable   Chronic diastolic heart failure (HCC) Appears to be a euvolemic, will hold Lasix for now given sepsis, but will hold on aggressive IV hydration  Obstructive sleep apnea On 2 L of O2 at night and during the day as needed   Morbid Obesity- Body mass index is 42.91 kg/m.   DM2 -Hold metformin she is on insulin 75/25 90 units twice daily, I will keep her on lower dose Semglee 25 units twice daily and will add insulin sliding scale     DVT Prophylaxis Heparin   AM Labs Ordered, also please review Full Orders  Family Communication: Admission, patients condition and plan  of care including tests being ordered have been discussed with the patient and daughter in law at bedside* who indicate understanding and agree with the plan and Code Status.  Code Status Full  Likely DC to  home  Condition GUARDED    Consults called: none    Admission status: inpatient    Time spent in minutes : 70 minutes   Huey Bienenstock M.D on 07/10/2022 at 6:56 PM   [facility] - Office  [phone]

## 2022-07-11 DIAGNOSIS — R6520 Severe sepsis without septic shock: Secondary | ICD-10-CM | POA: Diagnosis not present

## 2022-07-11 DIAGNOSIS — A419 Sepsis, unspecified organism: Secondary | ICD-10-CM | POA: Diagnosis not present

## 2022-07-11 LAB — CULTURE, BLOOD (ROUTINE X 2)

## 2022-07-11 LAB — BASIC METABOLIC PANEL
Anion gap: 8 (ref 5–15)
BUN: 13 mg/dL (ref 8–23)
CO2: 31 mmol/L (ref 22–32)
Calcium: 8.6 mg/dL — ABNORMAL LOW (ref 8.9–10.3)
Chloride: 98 mmol/L (ref 98–111)
Creatinine, Ser: 0.8 mg/dL (ref 0.44–1.00)
GFR, Estimated: >60 mL/min (ref >60)
Glucose, Bld: 287 mg/dL — ABNORMAL HIGH (ref 70–99)
Potassium: 3.5 mmol/L (ref 3.5–5.1)
Sodium: 137 mmol/L (ref 135–145)

## 2022-07-11 LAB — URINE CULTURE

## 2022-07-11 LAB — MRSA NEXT GEN BY PCR, NASAL: MRSA by PCR Next Gen: NOT DETECTED

## 2022-07-11 LAB — CBC
HCT: 41.1 % (ref 36.0–46.0)
Hemoglobin: 12.6 g/dL (ref 12.0–15.0)
MCH: 26.3 pg (ref 26.0–34.0)
MCHC: 30.7 g/dL (ref 30.0–36.0)
MCV: 85.8 fL (ref 80.0–100.0)
Platelets: 319 10*3/uL (ref 150–400)
RBC: 4.79 MIL/uL (ref 3.87–5.11)
RDW: 15.2 % (ref 11.5–15.5)
WBC: 14.7 10*3/uL — ABNORMAL HIGH (ref 4.0–10.5)
nRBC: 0 % (ref 0.0–0.2)

## 2022-07-11 LAB — GLUCOSE, CAPILLARY
Glucose-Capillary: 296 mg/dL — ABNORMAL HIGH (ref 70–99)
Glucose-Capillary: 286 mg/dL — ABNORMAL HIGH (ref 70–99)
Glucose-Capillary: 350 mg/dL — ABNORMAL HIGH (ref 70–99)
Glucose-Capillary: 371 mg/dL — ABNORMAL HIGH (ref 70–99)

## 2022-07-11 MED ORDER — BACID PO TABS: 2.0000 | ORAL_TABLET | Freq: Three times a day (TID) | ORAL | Status: DC

## 2022-07-11 MED ORDER — LACTATED RINGERS IV BOLUS
500.0000 mL | Freq: Once | INTRAVENOUS | Status: AC
  Administered 2022-07-11: 500 mL via INTRAVENOUS

## 2022-07-11 MED ORDER — RISAQUAD PO CAPS
1.0000 | ORAL_CAPSULE | Freq: Two times a day (BID) | ORAL | Status: DC
  Administered 2022-07-11 – 2022-07-14 (×7): 1 via ORAL
  Filled 2022-07-11 (×7): qty 1

## 2022-07-11 MED ORDER — INSULIN GLARGINE-YFGN 100 UNIT/ML ~~LOC~~ SOLN
28.0000 [IU] | Freq: Two times a day (BID) | SUBCUTANEOUS | Status: DC
  Administered 2022-07-11 (×2): 28 [IU] via SUBCUTANEOUS
  Filled 2022-07-11 (×5): qty 0.28

## 2022-07-11 NOTE — Progress Notes (Signed)
PROGRESS NOTE    Patient: [name]                            PCP: Ignatius Specking, MD                    DOB: [birth year]            DOA: 07/10/2022 UXL:[id number]             DOS: 07/11/2022, 2:20 PM   LOS: 1 day   Date of Service: The patient was seen and examined on 07/11/2022  Subjective:   The patient was seen and examined this morning. Hemodynamically stable. Denies any shortness of breath chest pain.. No issues overnight .  Brief Narrative:   Madalaine Portier  is a 72 y.o. female,  with medical history significant for diabetes mellitus, OSA, hypertension, diastolic CHF, coronary artery disease.  Admission last February for TIA/CVA, as well family reports she had an admission at [facility] last month for UTI, as well she had a visit to ED in ED yesterday for altered mental status with negative MRI brain. -Patient presents to ED secondary to complaints of chest pain, since last night, that has persisted till this morning, did not improve after receiving her nitro, she was chest pain-free upon my evaluation, she is very poor historian, she did have some fever for last couple days, but denies any upper respiratory symptoms, no dysuria, no polyuria, she does report abdominal pain as well started yesterday, no vomitin.   ED:  she was noted to have fever 102.7, lactic acid elevated at 2, leukocytosis at 14.9, CT abdomen pelvis significant for left pyelonephritis, troponins negative x 2,    Assessment & Plan:     Principal Problem:   Sepsis (HCC) Active Problems:   CAD (coronary artery disease)   Obstructive sleep apnea   Chronic diastolic heart failure (HCC)   CHEST PAIN-UNSPECIFIED   Anxiety and depression   Morbid obesity (HCC)   Essential hypertension, benign   Uncontrolled type 2 diabetes mellitus with hyperglycemia      Sepsis secondary to left pyelonephritis -Stable now -Current vitals: Blood pressure (!) 135/54, pulse 91, temperature 97.8 F (36.6 C), RR 18, On  RA SpO2 94 %.  -ZDG:[id number] present on admission,with Tmax of 102.7, lactic acid elevated at 2, leukocytosis at 14.9, -CT abdomen pelvis significant for with left pyonephritis -Urine culture -- -blood cultures-- -Received IV cefepime initially, will narrow to IV Rocephin given recent urine culture 05/09/2022 showing pansensitive E. Coli     Chest pain -Nontypical features, currently chest pain-free, troponins negative x 2, EKG nonacute -Resolved  History of CVA/TIA -  continue with aspirin statin, beta-blockers, and Imdur.   CAD -Continue with aspirin, statin, Imdur and Toprol-XL   Essential hypertension, benign Blood pressure stable -We will continue with metoprolol, Imdur and resume lisinopril    Chronic diastolic heart failure (HCC) Appears to be a euvolemic, will hold Lasix for now given sepsis, but will hold on aggressive IV hydration   Obstructive sleep apnea On 2 L of O2 at night and during the day as needed   Morbid Obesity- Body mass index is 42.91 kg/m.   DM2 -Hold metformin she is on insulin 75/25 90 units twice daily, I will keep her on lower dose Semglee 25 units twice daily and will add insulin sliding scale  Skin Assessment: I have examined the patient's skin and I agree with the wound assessment as performed by wound care team As outlined belowe: Pressure Injury 05/08/22 Toe (Comment  which one) Anterior;Right Stage 1 -  Intact skin with non-blanchable redness of a localized area usually over a bony prominence. (Active)  05/08/22 2000  Location: Toe (Comment  which one)  Location Orientation: Anterior;Right  Staging: Stage 1 -  Intact skin with non-blanchable redness of a localized area usually over a bony prominence.  Wound Description (Comments):   Present on Admission: Yes     Pressure Injury 05/08/22 Toe (Comment  which one) Anterior;Left Stage 1 -  Intact skin with non-blanchable redness of a localized area usually over a bony  prominence. (Active)  05/08/22 2000  Location: Toe (Comment  which one)  Location Orientation: Anterior;Left  Staging: Stage 1 -  Intact skin with non-blanchable redness of a localized area usually over a bony prominence.  Wound Description (Comments):   Present on Admission:      ---------------------------------------------------------------------------------------------------------------------------------------------------- Cultures; Blood Cultures x 2 >> NGT Urine Culture  >>> NGT     ------------------------------------------------------------------------------------------------------------------------------------------------  DVT prophylaxis:     Code Status:   Code Status: Full Code  Family Communication: No family member present at bedside- attempt will be made to update daily The above findings and plan of care has been discussed with patient (and family)  in detail,  they expressed understanding and agreement of above. -Advance care planning has been discussed.   Admission status:   Status is: Inpatient Remains inpatient appropriate because: Needing IV fluids, IV antibiotics treated sepsis due to pyelonephritis   Disposition: From  - home             Planning for discharge in 1-2 days: to   Procedures:   No admission procedures for hospital encounter.   Antimicrobials:  Anti-infectives (From admission, onward)    Start     Dose/Rate Route Frequency Ordered Stop   07/11/22 1500  vancomycin (VANCOREADY) IVPB 1250 mg/250 mL  Status:  Discontinued        1,250 mg 166.7 mL/hr over 90 Minutes Intravenous Every 24 hours 07/10/22 1436 07/10/22 1855   07/10/22 2200  ceFEPIme (MAXIPIME) 2 g in sodium chloride 0.9 % 100 mL IVPB  Status:  Discontinued        2 g 200 mL/hr over 30 Minutes Intravenous Every 8 hours 07/10/22 1430 07/10/22 1817   07/10/22 1900  cefTRIAXone (ROCEPHIN) 2 g in sodium chloride 0.9 % 100 mL IVPB        2 g 200 mL/hr over 30 Minutes  Intravenous Every 24 hours 07/10/22 1855 07/17/22 1859   07/10/22 1830  ceFEPIme (MAXIPIME) 2 g in sodium chloride 0.9 % 100 mL IVPB  Status:  Discontinued        2 g 200 mL/hr over 30 Minutes Intravenous STAT 07/10/22 1817 07/10/22 1820   07/10/22 1345  vancomycin (VANCOREADY) IVPB 2000 mg/400 mL        2,000 mg 200 mL/hr over 120 Minutes Intravenous  Once 07/10/22 1331 07/10/22 1801   07/10/22 1330  ceFEPIme (MAXIPIME) 2 g in sodium chloride 0.9 % 100 mL IVPB        2 g 200 mL/hr over 30 Minutes Intravenous  Once 07/10/22 1325 07/10/22 1422   07/10/22 1330  metroNIDAZOLE (FLAGYL) IVPB 500 mg        500 mg 100 mL/hr over 60 Minutes Intravenous  Once 07/10/22 1325 07/10/22 1452  07/10/22 1330  vancomycin (VANCOCIN) IVPB 1000 mg/200 mL premix  Status:  Discontinued        1,000 mg 200 mL/hr over 60 Minutes Intravenous  Once 07/10/22 1325 07/10/22 1331        Medication:   acidophilus  1 capsule Oral BID   aspirin EC  81 mg Oral Q breakfast   atorvastatin  40 mg Oral Daily   buPROPion ER  100 mg Oral Daily   docusate sodium  100 mg Oral BID   doxepin  75 mg Oral QHS   enoxaparin (LOVENOX) injection  60 mg Subcutaneous Q24H   gabapentin  300 mg Oral TID   insulin aspart  0-15 Units Subcutaneous TID WC   insulin aspart  0-5 Units Subcutaneous QHS   insulin glargine-yfgn  28 Units Subcutaneous BID   isosorbide mononitrate  30 mg Oral Daily   metoprolol succinate  100 mg Oral Daily   montelukast  10 mg Oral QHS   pantoprazole  40 mg Oral BID AC    acetaminophen, ALPRAZolam, hydrALAZINE, melatonin, nitroGLYCERIN, ondansetron **OR** ondansetron (ZOFRAN) IV, oxyCODONE-acetaminophen **AND** oxyCODONE   Objective:   Vitals:   07/11/22 0242 07/11/22 0540 07/11/22 0911 07/11/22 0911  BP: (!) 116/31 (!) 119/48 (!) 135/54 (!) 135/54  Pulse: (!) 102 (!) 101 91 91  Resp: 19 18 18    Temp: 99.3 F (37.4 C)  97.8 F (36.6 C)   TempSrc: Oral  Oral   SpO2: 91%  94% 94%  Weight:       Height:        Intake/Output Summary (Last 24 hours) at 07/11/2022 1420 Last data filed at 07/11/2022 1610 Gross per 24 hour  Intake 480 ml  Output 1300 ml  Net -820 ml   Filed Weights   07/10/22 1302  Weight: 113.4 kg     Physical examination:   Constitution:  Alert, cooperative, no distress,  Appears calm and comfortable  Psychiatric:   Normal and stable mood and affect, cognition intact,   HEENT:        Normocephalic, PERRL, otherwise with in Normal limits  Chest:         Chest symmetric Cardio vascular:  S1/S2, RRR, No murmure, No Rubs or Gallops  pulmonary: Clear to auscultation bilaterally, respirations unlabored, negative wheezes / crackles Abdomen: Soft, non-tender, non-distended, bowel sounds,no masses, no organomegaly Muscular skeletal: Limited exam - in bed, able to move all 4 extremities,   Neuro: CNII-XII intact. , normal motor and sensation, reflexes intact  Extremities: No pitting edema lower extremities, +2 pulses  Skin: Dry, warm to touch, negative for any Rashes, No open wounds Wounds: per nursing documentation   ------------------------------------------------------------------------------------------------------------------------------------------    LABs:     Latest Ref Rng & Units 07/11/2022    4:29 AM 07/10/2022    1:45 PM 05/12/2022    6:41 AM  CBC  WBC 4.0 - 10.5 K/uL 14.7  14.9  7.8   Hemoglobin 12.0 - 15.0 g/dL 96.0  45.4  09.8   Hematocrit 36.0 - 46.0 % 41.1  37.7  40.3   Platelets 150 - 400 K/uL 319  349  323       Latest Ref Rng & Units 07/11/2022    4:29 AM 07/10/2022    1:45 PM 05/12/2022    6:41 AM  CMP  Glucose 70 - 99 mg/dL 119  147  829   BUN 8 - 23 mg/dL 13  16  15    Creatinine 0.44 - 1.00  mg/dL 1.61  0.96  0.45   Sodium 135 - 145 mmol/L 137  135  141   Potassium 3.5 - 5.1 mmol/L 3.5  3.8  3.7   Chloride 98 - 111 mmol/L 98  96  98   CO2 22 - 32 mmol/L 31  32  32   Calcium 8.9 - 10.3 mg/dL 8.6  8.5  8.5   Total Protein 6.5 -  8.1 g/dL  6.4    Total Bilirubin 0.3 - 1.2 mg/dL  1.1    Alkaline Phos 38 - 126 U/L  69    AST 15 - 41 U/L  14    ALT 0 - 44 U/L  15         Micro Results Recent Results (from the past 240 hour(s))  Culture, blood (Routine x 2)     Status: None (Preliminary result)   Collection Time: 07/10/22  1:13 PM   Specimen: BLOOD  Result Value Ref Range Status   Specimen Description BLOOD LEFT ARM  Final   Special Requests   Final    BOTTLES DRAWN AEROBIC AND ANAEROBIC Blood Culture results may not be optimal due to an inadequate volume of blood received in culture bottles   Culture   Final    NO GROWTH < 24 HOURS Performed at [facility], [address] [place], [place] [address]    Report Status PENDING  Incomplete  Culture, blood (Routine x 2)     Status: None (Preliminary result)   Collection Time: 07/10/22  1:45 PM   Specimen: BLOOD  Result Value Ref Range Status   Specimen Description BLOOD BLOOD RIGHT ARM  Final   Special Requests   Final    BOTTLES DRAWN AEROBIC AND ANAEROBIC Blood Culture adequate volume   Culture   Final    NO GROWTH < 24 HOURS Performed at [facility], [address] [place], [place] [address]    Report Status PENDING  Incomplete  Resp panel by RT-PCR (RSV, Flu A&B, Covid) Anterior Nasal Swab     Status: None   Collection Time: 07/10/22  2:00 PM   Specimen: Anterior Nasal Swab  Result Value Ref Range Status   SARS Coronavirus 2 by RT PCR NEGATIVE NEGATIVE Final    Comment: (NOTE) SARS-CoV-2 target nucleic acids are NOT DETECTED.  The SARS-CoV-2 RNA is generally detectable in upper respiratory specimens during the acute phase of infection. The lowest concentration of SARS-CoV-2 viral copies this assay can detect is 138 copies/mL. A negative result does not preclude SARS-Cov-2 infection and should not be used as the sole basis for treatment or other patient management decisions. A negative result may occur with  improper specimen collection/handling,  submission of specimen other than nasopharyngeal swab, presence of viral mutation(s) within the areas targeted by this assay, and inadequate number of viral copies(<138 copies/mL). A negative result must be combined with clinical observations, patient history, and epidemiological information. The expected result is Negative.  Fact Sheet for Patients:  BloggerCourse.com  Fact Sheet for Healthcare Providers:  SeriousBroker.it  This test is no t yet approved or cleared by the [place] FDA and  has been authorized for detection and/or diagnosis of SARS-CoV-2 by FDA under an Emergency Use Authorization (EUA). This EUA will remain  in effect (meaning this test can be used) for the duration of the COVID-19 declaration under Section 564(b)(1) of the Act, 21 U.S.C.section 360bbb-3(b)(1), unless the authorization is terminated  or revoked sooner.  Influenza A by PCR NEGATIVE NEGATIVE Final   Influenza B by PCR NEGATIVE NEGATIVE Final    Comment: (NOTE) The Xpert Xpress SARS-CoV-2/FLU/RSV plus assay is intended as an aid in the diagnosis of influenza from Nasopharyngeal swab specimens and should not be used as a sole basis for treatment. Nasal washings and aspirates are unacceptable for Xpert Xpress SARS-CoV-2/FLU/RSV testing.  Fact Sheet for Patients: BloggerCourse.com  Fact Sheet for Healthcare Providers: SeriousBroker.it  This test is not yet approved or cleared by the [place] FDA and has been authorized for detection and/or diagnosis of SARS-CoV-2 by FDA under an Emergency Use Authorization (EUA). This EUA will remain in effect (meaning this test can be used) for the duration of the COVID-19 declaration under Section 564(b)(1) of the Act, 21 U.S.C. section 360bbb-3(b)(1), unless the authorization is terminated or revoked.     Resp Syncytial Virus by PCR NEGATIVE  NEGATIVE Final    Comment: (NOTE) Fact Sheet for Patients: BloggerCourse.com  Fact Sheet for Healthcare Providers: SeriousBroker.it  This test is not yet approved or cleared by the [place] FDA and has been authorized for detection and/or diagnosis of SARS-CoV-2 by FDA under an Emergency Use Authorization (EUA). This EUA will remain in effect (meaning this test can be used) for the duration of the COVID-19 declaration under Section 564(b)(1) of the Act, 21 U.S.C. section 360bbb-3(b)(1), unless the authorization is terminated or revoked.  Performed at [facility], [address], [place], [place] [address]     Radiology Reports CT Angio Chest PE W and/or Wo Contrast  Result Date: 07/10/2022 CLINICAL DATA:  Chest and abdominal pain. EXAM: CT ANGIOGRAPHY CHEST CT ABDOMEN AND PELVIS WITH CONTRAST TECHNIQUE: Multidetector CT imaging of the chest was performed using the standard protocol during bolus administration of intravenous contrast. Multiplanar CT image reconstructions and MIPs were obtained to evaluate the vascular anatomy. Multidetector CT imaging of the abdomen and pelvis was performed using the standard protocol during bolus administration of intravenous contrast. RADIATION DOSE REDUCTION: This exam was performed according to the departmental dose-optimization program which includes automated exposure control, adjustment of the mA and/or kV according to patient size and/or use of iterative reconstruction technique. CONTRAST:  OMNIPAQUE IOHEXOL 350 MG/ML SOLN COMPARISON:  CT abdomen/pelvis 05/09/2022 FINDINGS: CTA CHEST FINDINGS Cardiovascular: Extensive left-sided chest wall collateral vessels are noted. Suspect significant stenosis or partial occlusion of the left subclavian vein. The left brachiocephalic vein is normal. The heart is normal in size. No pericardial effusion. Prominent pericardial and epicardial fat. The aorta is  normal in caliber. No dissection. Scattered atherosclerotic calcifications. The branch vessels are patent. The pulmonary arterial tree is suboptimally opacified but no findings to suggest pulmonary embolism. Mediastinum/Nodes: Small scattered mediastinal and hilar lymph nodes but no mass or adenopathy. The esophagus is unremarkable. The thyroid gland is unremarkable. Lungs/Pleura: No significant pulmonary findings. No infiltrates, edema or effusions. Streaky areas of basilar scarring change in atelectasis. No worrisome pulmonary lesions. No pneumothorax. Musculoskeletal: No breast masses, supraclavicular or axillary adenopathy. The bony thorax is intact. Review of the MIP images confirms the above findings. CT ABDOMEN and PELVIS FINDINGS Hepatobiliary: Diffuse and fairly marked fatty infiltration of the liver but no focal hepatic lesions or intrahepatic biliary dilatation. Gallbladder is surgically absent. Mild associated common bile duct dilatation. The portal and hepatic veins are patent. Pancreas: No mass, inflammation or ductal dilatation. Spleen: Normal size.  No focal lesions. Adrenals/Urinary Tract: Stable low-attenuation left adrenal gland nodule measuring 22 mm. This measured 13  Hounsfield units on a prior noncontrast abdominal CT scan from 2021 and is consistent with a benign adenoma. No further imaging evaluation or follow-up is necessary. The right adrenal gland is normal. Few patchy areas of decreased perfusion involving the left kidney not present on the prior CT scan and suspicious for pyelonephritis. Mild renal cortical scarring changes involving the right kidney. No renal calculi or hydroureteronephrosis. The bladder is unremarkable. Stomach/Bowel: The stomach, duodenum, small and colon grossly normal. No acute inflammatory process, mass lesions obstructive findings. Vascular/Lymphatic: Moderate atherosclerotic calcifications involving the aorta and branch vessels but no aneurysm or dissection. The  major venous structures are patent. No mesenteric or retroperitoneal mass or adenopathy. Reproductive: Surgically absent. The uterus is surgically absent. Both ovaries are still present appear. Other: Marked diffuse bulging and thinning of the anterior abdominal wall without discrete hernia. Musculoskeletal: Significant thoracolumbar scoliosis and degenerative changes. No acute bony findings. 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 Review of the MIP images confirms the above findings. IMPRESSION: 1. Normal caliber thoracic aorta. No aneurysm or dissection. Scattered aortic and coronary artery calcifications. 2. No acute pulmonary findings. 3. Extensive left-sided chest wall collateral vessels. Suspect significant stenosis or partial occlusion of the left subclavian vein. 4. Few patchy areas of decreased perfusion involving the left kidney not present on the prior CT scan and suspicious for pyelonephritis. Recommend correlation with urinalysis. 5. Diffuse and fairly marked fatty infiltration of the liver. 6. Status post cholecystectomy with mild associated common bile duct dilatation. 7. Stable 22 mm benign left adrenal gland adenoma. 8. Marked diffuse bulging and thinning of the anterior abdominal wall without discrete hernia. 9. 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 10. Aortic atherosclerosis. Aortic Atherosclerosis (ICD10-I70.0). Electronically Signed   By: Rudie Meyer M.D.   On: 07/10/2022 16:45   CT ABDOMEN PELVIS W CONTRAST  Result Date: 07/10/2022 CLINICAL DATA:  Chest and abdominal pain. EXAM: CT ANGIOGRAPHY CHEST CT ABDOMEN AND PELVIS WITH CONTRAST TECHNIQUE: Multidetector CT imaging of the chest was performed using the standard protocol during bolus administration of intravenous contrast. Multiplanar CT image reconstructions and MIPs were obtained to evaluate the vascular anatomy. Multidetector CT imaging of the abdomen and pelvis was performed using the standard protocol during bolus  administration of intravenous contrast. RADIATION DOSE REDUCTION: This exam was performed according to the departmental dose-optimization program which includes automated exposure control, adjustment of the mA and/or kV according to patient size and/or use of iterative reconstruction technique. CONTRAST:  OMNIPAQUE IOHEXOL 350 MG/ML SOLN COMPARISON:  CT abdomen/pelvis 05/09/2022 FINDINGS: CTA CHEST FINDINGS Cardiovascular: Extensive left-sided chest wall collateral vessels are noted. Suspect significant stenosis or partial occlusion of the left subclavian vein. The left brachiocephalic vein is normal. The heart is normal in size. No pericardial effusion. Prominent pericardial and epicardial fat. The aorta is normal in caliber. No dissection. Scattered atherosclerotic calcifications. The branch vessels are patent. The pulmonary arterial tree is suboptimally opacified but no findings to suggest pulmonary embolism. Mediastinum/Nodes: Small scattered mediastinal and hilar lymph nodes but no mass or adenopathy. The esophagus is unremarkable. The thyroid gland is unremarkable. Lungs/Pleura: No significant pulmonary findings. No infiltrates, edema or effusions. Streaky areas of basilar scarring change in atelectasis. No worrisome pulmonary lesions. No pneumothorax. Musculoskeletal: No breast masses, supraclavicular or axillary adenopathy. The bony thorax is intact. Review of the MIP images confirms the above findings. CT ABDOMEN and PELVIS FINDINGS Hepatobiliary: Diffuse and fairly marked fatty infiltration of the liver but no focal hepatic  lesions or intrahepatic biliary dilatation. Gallbladder is surgically absent. Mild associated common bile duct dilatation. The portal and hepatic veins are patent. Pancreas: No mass, inflammation or ductal dilatation. Spleen: Normal size.  No focal lesions. Adrenals/Urinary Tract: Stable low-attenuation left adrenal gland nodule measuring 22 mm. This measured 13 Hounsfield units  on a prior noncontrast abdominal CT scan from 2021 and is consistent with a benign adenoma. No further imaging evaluation or follow-up is necessary. The right adrenal gland is normal. Few patchy areas of decreased perfusion involving the left kidney not present on the prior CT scan and suspicious for pyelonephritis. Mild renal cortical scarring changes involving the right kidney. No renal calculi or hydroureteronephrosis. The bladder is unremarkable. Stomach/Bowel: The stomach, duodenum, small and colon grossly normal. No acute inflammatory process, mass lesions obstructive findings. Vascular/Lymphatic: Moderate atherosclerotic calcifications involving the aorta and branch vessels but no aneurysm or dissection. The major venous structures are patent. No mesenteric or retroperitoneal mass or adenopathy. Reproductive: Surgically absent. The uterus is surgically absent. Both ovaries are still present appear. Other: Marked diffuse bulging and thinning of the anterior abdominal wall without discrete hernia. Musculoskeletal: Significant thoracolumbar scoliosis and degenerative changes. No acute bony findings. 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 Review of the MIP images confirms the above findings. IMPRESSION: 1. Normal caliber thoracic aorta. No aneurysm or dissection. Scattered aortic and coronary artery calcifications. 2. No acute pulmonary findings. 3. Extensive left-sided chest wall collateral vessels. Suspect significant stenosis or partial occlusion of the left subclavian vein. 4. Few patchy areas of decreased perfusion involving the left kidney not present on the prior CT scan and suspicious for pyelonephritis. Recommend correlation with urinalysis. 5. Diffuse and fairly marked fatty infiltration of the liver. 6. Status post cholecystectomy with mild associated common bile duct dilatation. 7. Stable 22 mm benign left adrenal gland adenoma. 8. Marked diffuse bulging and thinning of the anterior abdominal  wall without discrete hernia. 9. 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 10. Aortic atherosclerosis. Aortic Atherosclerosis (ICD10-I70.0). Electronically Signed   By: Rudie Meyer M.D.   On: 07/10/2022 16:45    SIGNED: Kendell Bane, MD, FHM. FAAFP. [facility] - [facility] Time spent - 35 min.  In seeing, evaluating and examining the patient. Reviewing medical records, labs, drawn plan of care. [facility],  Pager (please use [email or web address] to page/ text) Please use Epic Secure Chat for non-urgent communication (7AM-7PM)  If 7PM-7AM, please contact night-coverage [email or web address], 07/11/2022, 2:20 PM

## 2022-07-11 NOTE — Progress Notes (Signed)
  Transition of Care [facility]) Screening Note   Patient Details  Name: [name] Date of Birth: 1951/01/16   Transition of Care [facility]) CM/SW Contact:    Villa Herb, LCSWA Phone Number: 07/11/2022, 10:24 AM    Transition of Care Department [facility]) has reviewed patient and no TOC needs have been identified at this time. We will continue to monitor patient advancement through interdisciplinary progression rounds. If new patient transition needs arise, please place a TOC consult.

## 2022-07-11 NOTE — Hospital Course (Signed)
[name]  is a 72 y.o. female,  with medical history significant for diabetes mellitus, OSA, hypertension, diastolic CHF, coronary artery disease.  Admission last February for TIA/CVA, as well family reports she had an admission at [facility] last month for UTI, as well she had a visit to ED in ED yesterday for altered mental status with negative MRI brain. -Patient presents to ED secondary to complaints of chest pain, since last night, that has persisted till this morning, did not improve after receiving her nitro, she was chest pain-free upon my evaluation, she is very poor historian, she did have some fever for last couple days, but denies any upper respiratory symptoms, no dysuria, no polyuria, she does report abdominal pain as well started yesterday, no vomitin.   ED:  she was noted to have fever 102.7, lactic acid elevated at 2, leukocytosis at 14.9, CT abdomen pelvis significant for left pyelonephritis, troponins negative x 2,    Assessment & Plan:     Principal Problem:   Sepsis (HCC) Active Problems:   CAD (coronary artery disease)   Obstructive sleep apnea   Chronic diastolic heart failure (HCC)   CHEST PAIN-UNSPECIFIED   Anxiety and depression   Morbid obesity (HCC)   Essential hypertension, benign   Uncontrolled type 2 diabetes mellitus with hyperglycemia      Sepsis secondary to left pyelonephritis -Stable now -Current vitals: Blood pressure (!) 135/54, pulse 91, temperature 97.8 F (36.6 C), RR 18, On RA SpO2 94 %.  -ZOX:WRUEAV present on admission,with Tmax of 102.7, lactic acid elevated at 2, leukocytosis at 14.9, -CT abdomen pelvis significant for with left pyonephritis -Urine culture -- -blood cultures-- -Received IV cefepime initially, will narrow to IV Rocephin given recent urine culture 05/09/2022 showing pansensitive E. Coli     Chest pain -Nontypical features, currently chest pain-free, troponins negative x 2, EKG nonacute -Resolved  History of  CVA/TIA -  continue with aspirin statin, beta-blockers, and Imdur.   CAD -Continue with aspirin, statin, Imdur and Toprol-XL   Essential hypertension, benign Blood pressure stable -We will continue with metoprolol, Imdur and resume lisinopril    Chronic diastolic heart failure (HCC) Appears to be a euvolemic, will hold Lasix for now given sepsis, but will hold on aggressive IV hydration   Obstructive sleep apnea On 2 L of O2 at night and during the day as needed   Morbid Obesity- Body mass index is 42.91 kg/m.   DM2 -Hold metformin she is on insulin 75/25 90 units twice daily, I will keep her on lower dose Semglee 25 units twice daily and will add insulin sliding scale

## 2022-07-12 DIAGNOSIS — R6520 Severe sepsis without septic shock: Secondary | ICD-10-CM | POA: Diagnosis not present

## 2022-07-12 DIAGNOSIS — A419 Sepsis, unspecified organism: Secondary | ICD-10-CM | POA: Diagnosis not present

## 2022-07-12 LAB — CBC
HCT: 35.6 % — ABNORMAL LOW (ref 36.0–46.0)
Hemoglobin: 11 g/dL — ABNORMAL LOW (ref 12.0–15.0)
MCH: 26.5 pg (ref 26.0–34.0)
MCHC: 30.9 g/dL (ref 30.0–36.0)
MCV: 85.8 fL (ref 80.0–100.0)
Platelets: 300 10*3/uL (ref 150–400)
RBC: 4.15 MIL/uL (ref 3.87–5.11)
RDW: 15.1 % (ref 11.5–15.5)
WBC: 10.6 10*3/uL — ABNORMAL HIGH (ref 4.0–10.5)
nRBC: 0 % (ref 0.0–0.2)

## 2022-07-12 LAB — URINALYSIS, W/ REFLEX TO CULTURE (INFECTION SUSPECTED)
Bacteria, UA: NONE SEEN
Bilirubin Urine: NEGATIVE
Glucose, UA: >=500 mg/dL — AB
Ketones, ur: NEGATIVE mg/dL
Nitrite: NEGATIVE
Protein, ur: NEGATIVE mg/dL
Specific Gravity, Urine: 1.02 (ref 1.005–1.030)
WBC, UA: >50 WBC/hpf (ref 0–5)
pH: 6 (ref 5.0–8.0)

## 2022-07-12 LAB — COMPREHENSIVE METABOLIC PANEL
ALT: 11 U/L (ref 0–44)
AST: 11 U/L — ABNORMAL LOW (ref 15–41)
Albumin: 2.3 g/dL — ABNORMAL LOW (ref 3.5–5.0)
Alkaline Phosphatase: 68 U/L (ref 38–126)
Anion gap: 7 (ref 5–15)
BUN: 11 mg/dL (ref 8–23)
CO2: 30 mmol/L (ref 22–32)
Calcium: 8.3 mg/dL — ABNORMAL LOW (ref 8.9–10.3)
Chloride: 99 mmol/L (ref 98–111)
Creatinine, Ser: 0.72 mg/dL (ref 0.44–1.00)
GFR, Estimated: >60 mL/min (ref >60)
Glucose, Bld: 271 mg/dL — ABNORMAL HIGH (ref 70–99)
Potassium: 3.5 mmol/L (ref 3.5–5.1)
Sodium: 136 mmol/L (ref 135–145)
Total Bilirubin: 0.5 mg/dL (ref 0.3–1.2)
Total Protein: 5.9 g/dL — ABNORMAL LOW (ref 6.5–8.1)

## 2022-07-12 LAB — GLUCOSE, CAPILLARY
Glucose-Capillary: 275 mg/dL — ABNORMAL HIGH (ref 70–99)
Glucose-Capillary: 289 mg/dL — ABNORMAL HIGH (ref 70–99)
Glucose-Capillary: 254 mg/dL — ABNORMAL HIGH (ref 70–99)
Glucose-Capillary: 263 mg/dL — ABNORMAL HIGH (ref 70–99)

## 2022-07-12 LAB — CULTURE, BLOOD (ROUTINE X 2)

## 2022-07-12 MED ORDER — VITAMIN D 25 MCG (1000 UNIT) PO TABS
5000.0000 [IU] | ORAL_TABLET | Freq: Every day | ORAL | Status: DC
  Administered 2022-07-12 – 2022-07-14 (×3): 5000 [IU] via ORAL
  Filled 2022-07-12 (×2): qty 5

## 2022-07-12 MED ORDER — ADULT MULTIVITAMIN W/MINERALS CH
1.0000 | ORAL_TABLET | Freq: Every day | ORAL | Status: DC
  Administered 2022-07-12 – 2022-07-14 (×3): 1 via ORAL
  Filled 2022-07-12 (×3): qty 1

## 2022-07-12 MED ORDER — INSULIN ASPART PROT & ASPART (70-30 MIX) 100 UNIT/ML ~~LOC~~ SUSP
90.0000 [IU] | Freq: Two times a day (BID) | SUBCUTANEOUS | Status: DC
  Administered 2022-07-12 – 2022-07-14 (×4): 90 [IU] via SUBCUTANEOUS
  Filled 2022-07-12: qty 10

## 2022-07-12 NOTE — Progress Notes (Addendum)
Previous iv infiltrated. 4 attempts were made to access and iv with no success. ED was called to see if there was someone here to do ultrasound iv. The ED states they will not have anybody till 7 pm. MD Shahmehdi informed.

## 2022-07-12 NOTE — Progress Notes (Signed)
PROGRESS NOTE    Patient: [name]                            PCP: Ignatius Specking, MD                    DOB: Jul 05, 1950            DOA: 07/10/2022 [id number]:[id number]             DOS: 07/12/2022, 12:00 PM   LOS: 2 days   Date of Service: The patient was seen and examined on 07/12/2022  Subjective:   The patient was seen and examined this morning. Hemodynamically stable. Denies any shortness of breath chest pain.. No issues overnight .  Brief Narrative:   [name]  is a 72 y.o. female,  with medical history significant for diabetes mellitus, OSA, hypertension, diastolic CHF, coronary artery disease.  Admission last February for TIA/CVA, as well family reports she had an admission at [facility] last month for UTI, as well she had a visit to ED in ED yesterday for altered mental status with negative MRI brain. -Patient presents to ED secondary to complaints of chest pain, since last night, that has persisted till this morning, did not improve after receiving her nitro, she was chest pain-free upon my evaluation, she is very poor historian, she did have some fever for last couple days, but denies any upper respiratory symptoms, no dysuria, no polyuria, she does report abdominal pain as well started yesterday, no vomitin.   ED:  she was noted to have fever 102.7, lactic acid elevated at 2, leukocytosis at 14.9, CT abdomen pelvis significant for left pyelonephritis, troponins negative x 2,    Assessment & Plan:     Principal Problem:   Sepsis (HCC) Active Problems:   CAD (coronary artery disease)   Obstructive sleep apnea   Chronic diastolic heart failure (HCC)   CHEST PAIN-UNSPECIFIED   Anxiety and depression   Morbid obesity (HCC)   Essential hypertension, benign   Uncontrolled type 2 diabetes mellitus with hyperglycemia      Sepsis secondary to left pyelonephritis -Sepsis physiology resolved, hemodynamically stable -Current vitals: Blood pressure (!) 147/55, pulse  86, temperature 98.7 F (37.1 C), RR 18,  SpO2 92 %.  -NFA:OZHYQM present on admission,with Tmax of 102.7, lactic acid elevated at 2, leukocytosis at 14.9, -CT abdomen pelvis significant for with left pyonephritis -Urine culture --multiple species -blood cultures--no growth to date -Received IV cefepime initially, will narrow to IV Rocephin given recent urine culture 05/09/2022 showing pansensitive E. Coli     Chest pain -Nontypical features, currently chest pain-free, troponins negative x 2, EKG nonacute -Denies of having any further chest pain,  History of CVA/TIA -  continue with aspirin statin, beta-blockers, and Imdur.   CAD -Continue with aspirin, statin, Imdur and Toprol-XL   Essential hypertension, benign Blood pressure stable -We will continue with metoprolol, Imdur and resume lisinopril    Chronic diastolic heart failure (HCC) -Continue to hold Lasix for now given sepsis, but will hold on aggressive IV hydration   Obstructive sleep apnea On 2 L of O2 at night and during the day as needed -While awake satting 92% on room air   Morbid Obesity- Body mass index is 42.91 kg/m.   DM2 -Hold metformin she is on insulin 75/25 90 units twice daily, I will keep her on lower dose Semglee 25 units twice daily and  will add insulin sliding scale           Skin Assessment: I have examined the patient's skin and I agree with the wound assessment as performed by wound care team As outlined belowe: Pressure Injury 05/08/22 Toe (Comment  which one) Anterior;Right Stage 1 -  Intact skin with non-blanchable redness of a localized area usually over a bony prominence. (Active)  05/08/22 2000  Location: Toe (Comment  which one)  Location Orientation: Anterior;Right  Staging: Stage 1 -  Intact skin with non-blanchable redness of a localized area usually over a bony prominence.  Wound Description (Comments):   Present on Admission: Yes     Pressure Injury 05/08/22 Toe (Comment   which one) Anterior;Left Stage 1 -  Intact skin with non-blanchable redness of a localized area usually over a bony prominence. (Active)  05/08/22 2000  Location: Toe (Comment  which one)  Location Orientation: Anterior;Left  Staging: Stage 1 -  Intact skin with non-blanchable redness of a localized area usually over a bony prominence.  Wound Description (Comments):   Present on Admission:      ---------------------------------------------------------------------------------------------------------------------------------------------------- Cultures; Blood Cultures x 2 >> NGT Urine Culture  >>> NGT     ------------------------------------------------------------------------------------------------------------------------------------------------  DVT prophylaxis:     Code Status:   Code Status: Full Code  Family Communication: No family member present at bedside- attempt will be made to update daily The above findings and plan of care has been discussed with patient (and family)  in detail,  they expressed understanding and agreement of above. -Advance care planning has been discussed.   Admission status:   Status is: Inpatient Remains inpatient appropriate because: Needing IV fluids, IV antibiotics treated sepsis due to pyelonephritis   Disposition: From  - home             Planning for discharge in 1-2 days: to   Procedures:   No admission procedures for hospital encounter.   Antimicrobials:  Anti-infectives (From admission, onward)    Start     Dose/Rate Route Frequency Ordered Stop   07/11/22 1500  vancomycin (VANCOREADY) IVPB 1250 mg/250 mL  Status:  Discontinued        1,250 mg 166.7 mL/hr over 90 Minutes Intravenous Every 24 hours 07/10/22 1436 07/10/22 1855   07/10/22 2200  ceFEPIme (MAXIPIME) 2 g in sodium chloride 0.9 % 100 mL IVPB  Status:  Discontinued        2 g 200 mL/hr over 30 Minutes Intravenous Every 8 hours 07/10/22 1430 07/10/22 1817   07/10/22  1900  cefTRIAXone (ROCEPHIN) 2 g in sodium chloride 0.9 % 100 mL IVPB        2 g 200 mL/hr over 30 Minutes Intravenous Every 24 hours 07/10/22 1855 07/17/22 1859   07/10/22 1830  ceFEPIme (MAXIPIME) 2 g in sodium chloride 0.9 % 100 mL IVPB  Status:  Discontinued        2 g 200 mL/hr over 30 Minutes Intravenous STAT 07/10/22 1817 07/10/22 1820   07/10/22 1345  vancomycin (VANCOREADY) IVPB 2000 mg/400 mL        2,000 mg 200 mL/hr over 120 Minutes Intravenous  Once 07/10/22 1331 07/10/22 1801   07/10/22 1330  ceFEPIme (MAXIPIME) 2 g in sodium chloride 0.9 % 100 mL IVPB        2 g 200 mL/hr over 30 Minutes Intravenous  Once 07/10/22 1325 07/10/22 1422   07/10/22 1330  metroNIDAZOLE (FLAGYL) IVPB 500 mg  500 mg 100 mL/hr over 60 Minutes Intravenous  Once 07/10/22 1325 07/10/22 1452   07/10/22 1330  vancomycin (VANCOCIN) IVPB 1000 mg/200 mL premix  Status:  Discontinued        1,000 mg 200 mL/hr over 60 Minutes Intravenous  Once 07/10/22 1325 07/10/22 1331        Medication:   acidophilus  1 capsule Oral BID   aspirin EC  81 mg Oral Q breakfast   atorvastatin  40 mg Oral Daily   buPROPion ER  100 mg Oral Daily   cholecalciferol  5,000 Units Oral Daily   docusate sodium  100 mg Oral BID   doxepin  75 mg Oral QHS   enoxaparin (LOVENOX) injection  60 mg Subcutaneous Q24H   gabapentin  300 mg Oral TID   insulin aspart  0-15 Units Subcutaneous TID WC   insulin aspart  0-5 Units Subcutaneous QHS   insulin aspart protamine- aspart  90 Units Subcutaneous BID WC   isosorbide mononitrate  30 mg Oral Daily   metoprolol succinate  100 mg Oral Daily   montelukast  10 mg Oral QHS   multivitamin with minerals  1 tablet Oral Daily   pantoprazole  40 mg Oral BID AC    acetaminophen, ALPRAZolam, hydrALAZINE, melatonin, nitroGLYCERIN, ondansetron **OR** ondansetron (ZOFRAN) IV, oxyCODONE-acetaminophen **AND** oxyCODONE   Objective:   Vitals:   07/11/22 0911 07/11/22 2023 07/12/22 0527  07/12/22 0903  BP: (!) 135/54 (!) 140/62 (!) 152/54 (!) 147/55  Pulse: 91 87 87 86  Resp:  18 18   Temp:  99.4 F (37.4 C) 98.7 F (37.1 C)   TempSrc:  Oral Oral   SpO2: 94% 94% 92%   Weight:      Height:        Intake/Output Summary (Last 24 hours) at 07/12/2022 1200 Last data filed at 07/12/2022 0536 Gross per 24 hour  Intake 100 ml  Output 800 ml  Net -700 ml   Filed Weights   07/10/22 1302  Weight: 113.4 kg     Physical examination:        General:  AAO x 3,  cooperative, no distress;   HEENT:  Normocephalic, PERRL, otherwise with in Normal limits   Neuro:  CNII-XII intact. , normal motor and sensation, reflexes intact   Lungs:   Clear to auscultation BL, Respirations unlabored,  No wheezes / crackles  Cardio:    S1/S2, RRR, No murmure, No Rubs or Gallops   Abdomen:  Soft, non-tender, bowel sounds active all four quadrants, no guarding or peritoneal signs.  Muscular  skeletal:  Limited exam -global generalized weaknesses - in bed, able to move all 4 extremities,   2+ pulses,  symmetric, No pitting edema  Skin:  Dry, warm to touch, negative for any Rashes,  Wounds: Please see nursing documentation  Pressure Injury 05/08/22 Toe (Comment  which one) Anterior;Right Stage 1 -  Intact skin with non-blanchable redness of a localized area usually over a bony prominence. (Active)  05/08/22 2000  Location: Toe (Comment  which one)  Location Orientation: Anterior;Right  Staging: Stage 1 -  Intact skin with non-blanchable redness of a localized area usually over a bony prominence.  Wound Description (Comments):   Present on Admission: Yes     Pressure Injury 05/08/22 Toe (Comment  which one) Anterior;Left Stage 1 -  Intact skin with non-blanchable redness of a localized area usually over a bony prominence. (Active)  05/08/22 2000  Location: Toe (Comment  which  one)  Location Orientation: Anterior;Left  Staging: Stage 1 -  Intact skin with non-blanchable redness of a  localized area usually over a bony prominence.  Wound Description (Comments):   Present on Admission:         ----------------------------------------------------------------------------------------------------------------------------    LABs:     Latest Ref Rng & Units 07/12/2022    4:39 AM 07/11/2022    4:29 AM 07/10/2022    1:45 PM  CBC  WBC 4.0 - 10.5 K/uL 10.6  14.7  14.9   Hemoglobin 12.0 - 15.0 g/dL 16.1  09.6  04.5   Hematocrit 36.0 - 46.0 % 35.6  41.1  37.7   Platelets 150 - 400 K/uL 300  319  349       Latest Ref Rng & Units 07/12/2022    4:39 AM 07/11/2022    4:29 AM 07/10/2022    1:45 PM  CMP  Glucose 70 - 99 mg/dL 409  811  914   BUN 8 - 23 mg/dL 11  13  16    Creatinine 0.44 - 1.00 mg/dL 7.82  9.56  2.13   Sodium 135 - 145 mmol/L 136  137  135   Potassium 3.5 - 5.1 mmol/L 3.5  3.5  3.8   Chloride 98 - 111 mmol/L 99  98  96   CO2 22 - 32 mmol/L 30  31  32   Calcium 8.9 - 10.3 mg/dL 8.3  8.6  8.5   Total Protein 6.5 - 8.1 g/dL 5.9   6.4   Total Bilirubin 0.3 - 1.2 mg/dL 0.5   1.1   Alkaline Phos 38 - 126 U/L 68   69   AST 15 - 41 U/L 11   14   ALT 0 - 44 U/L 11   15        Micro Results Recent Results (from the past 240 hour(s))  Culture, blood (Routine x 2)     Status: None (Preliminary result)   Collection Time: 07/10/22  1:13 PM   Specimen: BLOOD  Result Value Ref Range Status   Specimen Description BLOOD LEFT ARM  Final   Special Requests   Final    BOTTLES DRAWN AEROBIC AND ANAEROBIC Blood Culture results may not be optimal due to an inadequate volume of blood received in culture bottles   Culture   Final    NO GROWTH 2 DAYS Performed at [facility], [address] [place], [place] [address]    Report Status PENDING  Incomplete  Culture, blood (Routine x 2)     Status: None (Preliminary result)   Collection Time: 07/10/22  1:45 PM   Specimen: BLOOD  Result Value Ref Range Status   Specimen Description BLOOD BLOOD RIGHT ARM  Final   Special  Requests   Final    BOTTLES DRAWN AEROBIC AND ANAEROBIC Blood Culture adequate volume   Culture   Final    NO GROWTH 2 DAYS Performed at [facility], [address], [place], [place] [address]    Report Status PENDING  Incomplete  Resp panel by RT-PCR (RSV, Flu A&B, Covid) Anterior Nasal Swab     Status: None   Collection Time: 07/10/22  2:00 PM   Specimen: Anterior Nasal Swab  Result Value Ref Range Status   SARS Coronavirus 2 by RT PCR NEGATIVE NEGATIVE Final    Comment: (NOTE) SARS-CoV-2 target nucleic acids are NOT DETECTED.  The SARS-CoV-2 RNA is generally detectable in upper respiratory specimens during the acute phase  of infection. The lowest concentration of SARS-CoV-2 viral copies this assay can detect is 138 copies/mL. A negative result does not preclude SARS-Cov-2 infection and should not be used as the sole basis for treatment or other patient management decisions. A negative result may occur with  improper specimen collection/handling, submission of specimen other than nasopharyngeal swab, presence of viral mutation(s) within the areas targeted by this assay, and inadequate number of viral copies(<138 copies/mL). A negative result must be combined with clinical observations, patient history, and epidemiological information. The expected result is Negative.  Fact Sheet for Patients:  BloggerCourse.com  Fact Sheet for Healthcare Providers:  SeriousBroker.it  This test is no t yet approved or cleared by the [place] FDA and  has been authorized for detection and/or diagnosis of SARS-CoV-2 by FDA under an Emergency Use Authorization (EUA). This EUA will remain  in effect (meaning this test can be used) for the duration of the COVID-19 declaration under Section 564(b)(1) of the Act, 21 U.S.C.section 360bbb-3(b)(1), unless the authorization is terminated  or revoked sooner.       Influenza A by PCR NEGATIVE  NEGATIVE Final   Influenza B by PCR NEGATIVE NEGATIVE Final    Comment: (NOTE) The Xpert Xpress SARS-CoV-2/FLU/RSV plus assay is intended as an aid in the diagnosis of influenza from Nasopharyngeal swab specimens and should not be used as a sole basis for treatment. Nasal washings and aspirates are unacceptable for Xpert Xpress SARS-CoV-2/FLU/RSV testing.  Fact Sheet for Patients: BloggerCourse.com  Fact Sheet for Healthcare Providers: SeriousBroker.it  This test is not yet approved or cleared by the [place] FDA and has been authorized for detection and/or diagnosis of SARS-CoV-2 by FDA under an Emergency Use Authorization (EUA). This EUA will remain in effect (meaning this test can be used) for the duration of the COVID-19 declaration under Section 564(b)(1) of the Act, 21 U.S.C. section 360bbb-3(b)(1), unless the authorization is terminated or revoked.     Resp Syncytial Virus by PCR NEGATIVE NEGATIVE Final    Comment: (NOTE) Fact Sheet for Patients: BloggerCourse.com  Fact Sheet for Healthcare Providers: SeriousBroker.it  This test is not yet approved or cleared by the [place] FDA and has been authorized for detection and/or diagnosis of SARS-CoV-2 by FDA under an Emergency Use Authorization (EUA). This EUA will remain in effect (meaning this test can be used) for the duration of the COVID-19 declaration under Section 564(b)(1) of the Act, 21 U.S.C. section 360bbb-3(b)(1), unless the authorization is terminated or revoked.  Performed at [facility], [address], [place], [place] [address]   Urine Culture     Status: Abnormal   Collection Time: 07/10/22  4:20 PM   Specimen: Urine, Clean Catch  Result Value Ref Range Status   Specimen Description   Final    URINE, CLEAN CATCH Performed at [facility], [address], [place], [place] [address]    Special  Requests   Final    NONE Performed at [facility], [address] [place], [place] [address]    Culture MULTIPLE SPECIES PRESENT, SUGGEST RECOLLECTION (A)  Final   Report Status 07/11/2022 FINAL  Final  MRSA Next Gen by PCR, Nasal     Status: None   Collection Time: 07/11/22 11:30 AM   Specimen: Nasal Mucosa; Nasal Swab  Result Value Ref Range Status   MRSA by PCR Next Gen NOT DETECTED NOT DETECTED Final    Comment: (NOTE) The GeneXpert MRSA Assay (FDA approved for NASAL specimens only), is  one component of a comprehensive MRSA colonization surveillance program. It is not intended to diagnose MRSA infection nor to guide or monitor treatment for MRSA infections. Test performance is not FDA approved in patients less than 84 years old. Performed at [facility], [address] [place], [place] [address]     Radiology Reports No results found.  SIGNED: Kendell Bane, MD, FHM. FAAFP. [facility] - [facility] Time spent - 35 min.  In seeing, evaluating and examining the patient. Reviewing medical records, labs, drawn plan of care. [facility],  Pager (please use [email or web address] to page/ text) Please use Epic Secure Chat for non-urgent communication (7AM-7PM)  If 7PM-7AM, please contact night-coverage [email or web address], 07/12/2022, 12:00 PM

## 2022-07-12 NOTE — TOC Progression Note (Signed)
Transition of Care [facility]) - Progression Note    Patient Details  Name: [name] MRN: [id number] Date of Birth: 1950/07/09  Transition [facility]) CM/SW Contact  Catalina Gravel, [place] Phone Number: 07/12/2022, 2:51 PM  Clinical Narrative:    Pt h/o indicates there was HHRN/ prior to hospitalization.  CSW attempted to contact [facility] to clarify. No response as of yet. Dc expected 1-2 days.     Barriers to Discharge: Continued Medical Work up  Expected Discharge Plan and Services                                               Social Determinants of Health (SDOH) Interventions SDOH Screenings   Food Insecurity: No Food Insecurity (05/08/2022)  Housing: Low Risk  (05/08/2022)  Transportation Needs: No Transportation Needs (05/08/2022)  Utilities: Not At Risk (05/08/2022)  Depression (PHQ2-9): Medium Risk (10/17/2021)  Tobacco Use: Low Risk  (07/03/2022)    Readmission Risk Interventions     No data to display

## 2022-07-13 DIAGNOSIS — R6520 Severe sepsis without septic shock: Secondary | ICD-10-CM | POA: Diagnosis not present

## 2022-07-13 DIAGNOSIS — A419 Sepsis, unspecified organism: Secondary | ICD-10-CM | POA: Diagnosis not present

## 2022-07-13 LAB — COMPREHENSIVE METABOLIC PANEL
ALT: 11 U/L (ref 0–44)
AST: 12 U/L — ABNORMAL LOW (ref 15–41)
Albumin: 2.2 g/dL — ABNORMAL LOW (ref 3.5–5.0)
Alkaline Phosphatase: 64 U/L (ref 38–126)
Anion gap: 7 (ref 5–15)
BUN: 10 mg/dL (ref 8–23)
CO2: 31 mmol/L (ref 22–32)
Calcium: 8.4 mg/dL — ABNORMAL LOW (ref 8.9–10.3)
Chloride: 102 mmol/L (ref 98–111)
Creatinine, Ser: 0.62 mg/dL (ref 0.44–1.00)
GFR, Estimated: >60 mL/min (ref >60)
Glucose, Bld: 134 mg/dL — ABNORMAL HIGH (ref 70–99)
Potassium: 3.2 mmol/L — ABNORMAL LOW (ref 3.5–5.1)
Sodium: 140 mmol/L (ref 135–145)
Total Bilirubin: 0.6 mg/dL (ref 0.3–1.2)
Total Protein: 5.8 g/dL — ABNORMAL LOW (ref 6.5–8.1)

## 2022-07-13 LAB — CBC
HCT: 34.3 % — ABNORMAL LOW (ref 36.0–46.0)
Hemoglobin: 10.9 g/dL — ABNORMAL LOW (ref 12.0–15.0)
MCH: 26.8 pg (ref 26.0–34.0)
MCHC: 31.8 g/dL (ref 30.0–36.0)
MCV: 84.5 fL (ref 80.0–100.0)
Platelets: 312 10*3/uL (ref 150–400)
RBC: 4.06 MIL/uL (ref 3.87–5.11)
RDW: 15.1 % (ref 11.5–15.5)
WBC: 8.7 10*3/uL (ref 4.0–10.5)
nRBC: 0 % (ref 0.0–0.2)

## 2022-07-13 LAB — GLUCOSE, CAPILLARY
Glucose-Capillary: 157 mg/dL — ABNORMAL HIGH (ref 70–99)
Glucose-Capillary: 235 mg/dL — ABNORMAL HIGH (ref 70–99)
Glucose-Capillary: 281 mg/dL — ABNORMAL HIGH (ref 70–99)
Glucose-Capillary: 234 mg/dL — ABNORMAL HIGH (ref 70–99)

## 2022-07-13 LAB — URINE CULTURE

## 2022-07-13 MED ORDER — FUROSEMIDE 40 MG PO TABS
40.0000 mg | ORAL_TABLET | Freq: Every day | ORAL | Status: DC
  Administered 2022-07-13 – 2022-07-14 (×2): 40 mg via ORAL
  Filled 2022-07-13 (×2): qty 1

## 2022-07-13 MED ORDER — CIPROFLOXACIN HCL 250 MG PO TABS
500.0000 mg | ORAL_TABLET | Freq: Two times a day (BID) | ORAL | Status: DC
  Administered 2022-07-13 – 2022-07-14 (×3): 500 mg via ORAL
  Filled 2022-07-13 (×3): qty 2

## 2022-07-13 NOTE — Progress Notes (Signed)
PROGRESS NOTE    Patient: [name]                            PCP: Ignatius Specking, MD                    DOB: January 06, 1951            DOA: 07/10/2022 ZOX:[id number]             DOS: 07/13/2022, 11:05 AM   LOS: 3 days   Date of Service: The patient was seen and examined on 07/13/2022  Subjective:   The patient was seen and examined this morning, complain of generalized weakness, mild left-sided CVA tenderness Was hemodynamically stable, denies any chest pain or shortness of breath  Brief Narrative:   Marinna Blane  is a 72 y.o. female,  with medical history significant for diabetes mellitus, OSA, hypertension, diastolic CHF, coronary artery disease.  Admission last February for TIA/CVA, as well family reports she had an admission at [facility] last month for UTI, as well she had a visit to ED in ED yesterday for altered mental status with negative MRI brain. -Patient presents to ED secondary to complaints of chest pain, since last night, that has persisted till this morning, did not improve after receiving her nitro, she was chest pain-free upon my evaluation, she is very poor historian, she did have some fever for last couple days, but denies any upper respiratory symptoms, no dysuria, no polyuria, she does report abdominal pain as well started yesterday, no vomitin.   ED:  she was noted to have fever 102.7, lactic acid elevated at 2, leukocytosis at 14.9, CT abdomen pelvis significant for left pyelonephritis, troponins negative x 2,    Assessment & Plan:     Principal Problem:   Sepsis (HCC) Active Problems:   CAD (coronary artery disease)   Obstructive sleep apnea   Chronic diastolic heart failure (HCC)   CHEST PAIN-UNSPECIFIED   Anxiety and depression   Morbid obesity (HCC)   Essential hypertension, benign   Uncontrolled type 2 diabetes mellitus with hyperglycemia      Sepsis secondary to left pyelonephritis -Sepsis physiology resolved, -Hemodynamically stable now  --afebrile normotensive, improved leukocytosis, -WJX:BJYNWG present on admission,with Tmax of 102.7, lactic acid elevated at 2, leukocytosis at 14.9, -CT abdomen pelvis significant for with left pyonephritis -Urine culture --multiple species-repeating UA -blood cultures--no growth to date -Received IV cefepime initially, will narrow to IV Rocephin given recent urine culture 05/09/2022 showing pansensitive E. Coli -Changing IV antibiotics to ciprofloxacin PO      Chest pain -Nontypical features, currently chest pain-free, troponins negative x 2, EKG nonacute -Denies of having any further chest pain,  History of CVA/TIA -  continue with aspirin statin, beta-blockers, and Imdur.   CAD -Continue with aspirin, statin, Imdur and Toprol-XL   Essential hypertension, benign Blood pressure stable -We will continue with metoprolol, Imdur and resume lisinopril    Chronic diastolic heart failure (HCC) -Continue to hold Lasix for sepsis, >> will resume now  Obstructive sleep apnea On 2 L of O2 at night and during the day as needed -Satting 98% on 2 L of oxygen   Morbid Obesity- Body mass index is 42.91 kg/m.   Hypokalemia  -Trending,  repleting p.o.  DM2 -Hold metformin she is on insulin 75/25 90 units twice daily,  Checking CBG q. ACHS, SSI coverage Will resume with 70/ 30 titrate  units--- clear 90 units twice a day      Skin Assessment: I have examined the patient's skin and I agree with the wound assessment as performed by wound care team As outlined belowe: Pressure Injury 05/08/22 Toe (Comment  which one) Anterior;Right Stage 1 -  Intact skin with non-blanchable redness of a localized area usually over a bony prominence. (Active)  05/08/22 2000  Location: Toe (Comment  which one)  Location Orientation: Anterior;Right  Staging: Stage 1 -  Intact skin with non-blanchable redness of a localized area usually over a bony prominence.  Wound Description (Comments):   Present on  Admission: Yes     Pressure Injury 05/08/22 Toe (Comment  which one) Anterior;Left Stage 1 -  Intact skin with non-blanchable redness of a localized area usually over a bony prominence. (Active)  05/08/22 2000  Location: Toe (Comment  which one)  Location Orientation: Anterior;Left  Staging: Stage 1 -  Intact skin with non-blanchable redness of a localized area usually over a bony prominence.  Wound Description (Comments):   Present on Admission:      ---------------------------------------------------------------------------------------------------------------------------------------------------- Cultures; Blood Cultures x 2 >> NGT Urine Culture  >>> nonspecific multiple species--- pending UA and culture  -------------------------------------------------------------------------------------------------------------------------  DVT prophylaxis:  CDs- Lovenox   Code Status:   Code Status: Full Code  Family Communication: No family member present at bedside- attempt will be made to update daily The above findings and plan of care has been discussed with patient (and family)  in detail,  they expressed understanding and agreement of above. -Advance care planning has been discussed.   Admission status:   Status is: Inpatient Remains inpatient appropriate because: Needing IV fluids, IV antibiotics treated sepsis due to pyelonephritis   Disposition: From  - home             Planning for discharge in 1-2 days: to home with home health  Procedures:   No admission procedures for hospital encounter.   Antimicrobials:  Anti-infectives (From admission, onward)    Start     Dose/Rate Route Frequency Ordered Stop   07/13/22 0845  ciprofloxacin (CIPRO) tablet 500 mg        500 mg Oral 2 times daily 07/13/22 0754     07/11/22 1500  vancomycin (VANCOREADY) IVPB 1250 mg/250 mL  Status:  Discontinued        1,250 mg 166.7 mL/hr over 90 Minutes Intravenous Every 24 hours 07/10/22 1436  07/10/22 1855   07/10/22 2200  ceFEPIme (MAXIPIME) 2 g in sodium chloride 0.9 % 100 mL IVPB  Status:  Discontinued        2 g 200 mL/hr over 30 Minutes Intravenous Every 8 hours 07/10/22 1430 07/10/22 1817   07/10/22 1900  cefTRIAXone (ROCEPHIN) 2 g in sodium chloride 0.9 % 100 mL IVPB  Status:  Discontinued        2 g 200 mL/hr over 30 Minutes Intravenous Every 24 hours 07/10/22 1855 07/13/22 0754   07/10/22 1830  ceFEPIme (MAXIPIME) 2 g in sodium chloride 0.9 % 100 mL IVPB  Status:  Discontinued        2 g 200 mL/hr over 30 Minutes Intravenous STAT 07/10/22 1817 07/10/22 1820   07/10/22 1345  vancomycin (VANCOREADY) IVPB 2000 mg/400 mL        2,000 mg 200 mL/hr over 120 Minutes Intravenous  Once 07/10/22 1331 07/10/22 1801   07/10/22 1330  ceFEPIme (MAXIPIME) 2 g in sodium chloride 0.9 % 100 mL IVPB  2 g 200 mL/hr over 30 Minutes Intravenous  Once 07/10/22 1325 07/10/22 1422   07/10/22 1330  metroNIDAZOLE (FLAGYL) IVPB 500 mg        500 mg 100 mL/hr over 60 Minutes Intravenous  Once 07/10/22 1325 07/10/22 1452   07/10/22 1330  vancomycin (VANCOCIN) IVPB 1000 mg/200 mL premix  Status:  Discontinued        1,000 mg 200 mL/hr over 60 Minutes Intravenous  Once 07/10/22 1325 07/10/22 1331        Medication:   acidophilus  1 capsule Oral BID   aspirin EC  81 mg Oral Q breakfast   atorvastatin  40 mg Oral Daily   buPROPion ER  100 mg Oral Daily   cholecalciferol  5,000 Units Oral Daily   ciprofloxacin  500 mg Oral BID   docusate sodium  100 mg Oral BID   doxepin  75 mg Oral QHS   enoxaparin (LOVENOX) injection  60 mg Subcutaneous Q24H   furosemide  40 mg Oral Daily   gabapentin  300 mg Oral TID   insulin aspart  0-15 Units Subcutaneous TID WC   insulin aspart  0-5 Units Subcutaneous QHS   insulin aspart protamine- aspart  90 Units Subcutaneous BID WC   isosorbide mononitrate  30 mg Oral Daily   metoprolol succinate  100 mg Oral Daily   montelukast  10 mg Oral QHS    multivitamin with minerals  1 tablet Oral Daily   pantoprazole  40 mg Oral BID AC    acetaminophen, ALPRAZolam, hydrALAZINE, melatonin, nitroGLYCERIN, ondansetron **OR** ondansetron (ZOFRAN) IV, oxyCODONE-acetaminophen **AND** oxyCODONE   Objective:   Vitals:   07/12/22 1503 07/12/22 2013 07/13/22 0447 07/13/22 0906  BP: 131/65  (!) 131/50 (!) 149/64  Pulse: 71 91 72 79  Resp: 20 20 18    Temp: 98.5 F (36.9 C) 98.7 F (37.1 C) 98.7 F (37.1 C)   TempSrc: Oral Oral Oral   SpO2: 95% 92% 98%   Weight:      Height:        Intake/Output Summary (Last 24 hours) at 07/13/2022 1105 Last data filed at 07/13/2022 0900 Gross per 24 hour  Intake 697 ml  Output 1850 ml  Net -1153 ml   Filed Weights   07/10/22 1302  Weight: 113.4 kg     Physical examination:         General:  AAO x 3,  cooperative, no distress;   HEENT:  Normocephalic, PERRL, otherwise with in Normal limits   Neuro:  CNII-XII intact. , normal motor and sensation, reflexes intact   Lungs:   Clear to auscultation BL, Respirations unlabored,  No wheezes / crackles  Cardio:    S1/S2, RRR, No murmure, No Rubs or Gallops   Abdomen:  Soft, non-tender, bowel sounds active all four quadrants, no guarding or peritoneal signs.  Muscular  skeletal:  CVA tenderness,  Limited exam -global generalized weaknesses - in bed, able to move all 4 extremities,   2+ pulses,  symmetric, No pitting edema  Skin:  Dry, warm to touch, negative for any Rashes,  Wounds: Please see nursing documentation  Pressure Injury 05/08/22 Toe (Comment  which one) Anterior;Right Stage 1 -  Intact skin with non-blanchable redness of a localized area usually over a bony prominence. (Active)  05/08/22 2000  Location: Toe (Comment  which one)  Location Orientation: Anterior;Right  Staging: Stage 1 -  Intact skin with non-blanchable redness of a localized area usually over a bony  prominence.  Wound Description (Comments):   Present on Admission:  Yes     Pressure Injury 05/08/22 Toe (Comment  which one) Anterior;Left Stage 1 -  Intact skin with non-blanchable redness of a localized area usually over a bony prominence. (Active)  05/08/22 2000  Location: Toe (Comment  which one)  Location Orientation: Anterior;Left  Staging: Stage 1 -  Intact skin with non-blanchable redness of a localized area usually over a bony prominence.  Wound Description (Comments):   Present on Admission:            ----------------------------------------------------------------------------------------------------------------------------    LABs:     Latest Ref Rng & Units 07/13/2022    4:31 AM 07/12/2022    4:39 AM 07/11/2022    4:29 AM  CBC  WBC 4.0 - 10.5 K/uL 8.7  10.6  14.7   Hemoglobin 12.0 - 15.0 g/dL 16.1  09.6  04.5   Hematocrit 36.0 - 46.0 % 34.3  35.6  41.1   Platelets 150 - 400 K/uL 312  300  319       Latest Ref Rng & Units 07/13/2022    4:31 AM 07/12/2022    4:39 AM 07/11/2022    4:29 AM  CMP  Glucose 70 - 99 mg/dL 409  811  914   BUN 8 - 23 mg/dL 10  11  13    Creatinine 0.44 - 1.00 mg/dL 7.82  9.56  2.13   Sodium 135 - 145 mmol/L 140  136  137   Potassium 3.5 - 5.1 mmol/L 3.2  3.5  3.5   Chloride 98 - 111 mmol/L 102  99  98   CO2 22 - 32 mmol/L 31  30  31    Calcium 8.9 - 10.3 mg/dL 8.4  8.3  8.6   Total Protein 6.5 - 8.1 g/dL 5.8  5.9    Total Bilirubin 0.3 - 1.2 mg/dL 0.6  0.5    Alkaline Phos 38 - 126 U/L 64  68    AST 15 - 41 U/L 12  11    ALT 0 - 44 U/L 11  11         Micro Results Recent Results (from the past 240 hour(s))  Culture, blood (Routine x 2)     Status: None (Preliminary result)   Collection Time: 07/10/22  1:13 PM   Specimen: BLOOD  Result Value Ref Range Status   Specimen Description BLOOD LEFT ARM  Final   Special Requests   Final    BOTTLES DRAWN AEROBIC AND ANAEROBIC Blood Culture results may not be optimal due to an inadequate volume of blood received in culture bottles   Culture   Final     NO GROWTH 3 DAYS Performed at [facility], [address], [place], [place] [address]    Report Status PENDING  Incomplete  Culture, blood (Routine x 2)     Status: None (Preliminary result)   Collection Time: 07/10/22  1:45 PM   Specimen: BLOOD  Result Value Ref Range Status   Specimen Description BLOOD BLOOD RIGHT ARM  Final   Special Requests   Final    BOTTLES DRAWN AEROBIC AND ANAEROBIC Blood Culture adequate volume   Culture   Final    NO GROWTH 3 DAYS Performed at [facility], [address] [place], [place] [address]    Report Status PENDING  Incomplete  Resp panel by RT-PCR (RSV, Flu A&B, Covid) Anterior Nasal Swab     Status: None   Collection  Time: 07/10/22  2:00 PM   Specimen: Anterior Nasal Swab  Result Value Ref Range Status   SARS Coronavirus 2 by RT PCR NEGATIVE NEGATIVE Final    Comment: (NOTE) SARS-CoV-2 target nucleic acids are NOT DETECTED.  The SARS-CoV-2 RNA is generally detectable in upper respiratory specimens during the acute phase of infection. The lowest concentration of SARS-CoV-2 viral copies this assay can detect is 138 copies/mL. A negative result does not preclude SARS-Cov-2 infection and should not be used as the sole basis for treatment or other patient management decisions. A negative result may occur with  improper specimen collection/handling, submission of specimen other than nasopharyngeal swab, presence of viral mutation(s) within the areas targeted by this assay, and inadequate number of viral copies(<138 copies/mL). A negative result must be combined with clinical observations, patient history, and epidemiological information. The expected result is Negative.  Fact Sheet for Patients:  BloggerCourse.com  Fact Sheet for Healthcare Providers:  SeriousBroker.it  This test is no t yet approved or cleared by the [place] FDA and  has been authorized for detection and/or diagnosis of  SARS-CoV-2 by FDA under an Emergency Use Authorization (EUA). This EUA will remain  in effect (meaning this test can be used) for the duration of the COVID-19 declaration under Section 564(b)(1) of the Act, 21 U.S.C.section 360bbb-3(b)(1), unless the authorization is terminated  or revoked sooner.       Influenza A by PCR NEGATIVE NEGATIVE Final   Influenza B by PCR NEGATIVE NEGATIVE Final    Comment: (NOTE) The Xpert Xpress SARS-CoV-2/FLU/RSV plus assay is intended as an aid in the diagnosis of influenza from Nasopharyngeal swab specimens and should not be used as a sole basis for treatment. Nasal washings and aspirates are unacceptable for Xpert Xpress SARS-CoV-2/FLU/RSV testing.  Fact Sheet for Patients: BloggerCourse.com  Fact Sheet for Healthcare Providers: SeriousBroker.it  This test is not yet approved or cleared by the [place] FDA and has been authorized for detection and/or diagnosis of SARS-CoV-2 by FDA under an Emergency Use Authorization (EUA). This EUA will remain in effect (meaning this test can be used) for the duration of the COVID-19 declaration under Section 564(b)(1) of the Act, 21 U.S.C. section 360bbb-3(b)(1), unless the authorization is terminated or revoked.     Resp Syncytial Virus by PCR NEGATIVE NEGATIVE Final    Comment: (NOTE) Fact Sheet for Patients: BloggerCourse.com  Fact Sheet for Healthcare Providers: SeriousBroker.it  This test is not yet approved or cleared by the [place] FDA and has been authorized for detection and/or diagnosis of SARS-CoV-2 by FDA under an Emergency Use Authorization (EUA). This EUA will remain in effect (meaning this test can be used) for the duration of the COVID-19 declaration under Section 564(b)(1) of the Act, 21 U.S.C. section 360bbb-3(b)(1), unless the authorization is terminated  or revoked.  Performed at [facility], [address] [place], [place] [address]   Urine Culture     Status: Abnormal   Collection Time: 07/10/22  4:20 PM   Specimen: Urine, Clean Catch  Result Value Ref Range Status   Specimen Description   Final    URINE, CLEAN CATCH Performed at [facility], [address], [place], [place] [address]    Special Requests   Final    NONE Performed at [facility], [address], [place], [place] [address]    Culture MULTIPLE SPECIES PRESENT, SUGGEST RECOLLECTION (A)  Final   Report Status 07/11/2022 FINAL  Final  MRSA Next Gen by PCR,  Nasal     Status: None   Collection Time: 07/11/22 11:30 AM   Specimen: Nasal Mucosa; Nasal Swab  Result Value Ref Range Status   MRSA by PCR Next Gen NOT DETECTED NOT DETECTED Final    Comment: (NOTE) The GeneXpert MRSA Assay (FDA approved for NASAL specimens only), is one component of a comprehensive MRSA colonization surveillance program. It is not intended to diagnose MRSA infection nor to guide or monitor treatment for MRSA infections. Test performance is not FDA approved in patients less than 32 years old. Performed at [facility], [address], [place], [place] [address]     Radiology Reports No results found.  SIGNED: Kendell Bane, MD, FHM. FAAFP. [facility] - [facility] Time spent - 35 min.  In seeing, evaluating and examining the patient. Reviewing medical records, labs, drawn plan of care. [facility],  Pager (please use [email or web address] to page/ text) Please use Epic Secure Chat for non-urgent communication (7AM-7PM)  If 7PM-7AM, please contact night-coverage [email or web address], 07/13/2022, 11:05 AM

## 2022-07-13 NOTE — Plan of Care (Signed)
  Problem: Fluid Volume: Goal: Hemodynamic stability will improve Outcome: Not Progressing   Problem: Clinical Measurements: Goal: Signs and symptoms of infection will decrease Outcome: Not Progressing   Problem: Clinical Measurements: Goal: Will remain free from infection Outcome: Not Progressing   Problem: Coping: Goal: Level of anxiety will decrease Outcome: Not Progressing

## 2022-07-13 NOTE — TOC Progression Note (Signed)
Transition of Care [facility]) - Progression Note    Patient Details  Name: BRITTAIN SMITHEY MRN: [id number] Date of Birth: Aug 09, 1950  Transition of Care [facility]) CM/SW Contact  Catalina Gravel, [place] Phone Number: 07/13/2022, 3:37 PM  Clinical Narrative:    CSW contacted [facility], verified pt active for nursing, no longer PT/OT active.  Should pt need those they can resume. Shared with DR. During progression. DC Monday     Barriers to Discharge: Continued Medical Work up  Expected Discharge Plan and Services                                               Social Determinants of Health (SDOH) Interventions SDOH Screenings   Food Insecurity: No Food Insecurity (05/08/2022)  Housing: Low Risk  (05/08/2022)  Transportation Needs: No Transportation Needs (05/08/2022)  Utilities: Not At Risk (05/08/2022)  Depression (PHQ2-9): Medium Risk (10/17/2021)  Tobacco Use: Low Risk  (07/03/2022)    Readmission Risk Interventions     No data to display

## 2022-07-14 ENCOUNTER — Other Ambulatory Visit: Payer: Self-pay | Admitting: *Deleted

## 2022-07-14 DIAGNOSIS — R6520 Severe sepsis without septic shock: Secondary | ICD-10-CM | POA: Diagnosis not present

## 2022-07-14 DIAGNOSIS — A4151 Sepsis due to Escherichia coli [E. coli]: Secondary | ICD-10-CM

## 2022-07-14 LAB — COMPREHENSIVE METABOLIC PANEL
ALT: 13 U/L (ref 0–44)
AST: 14 U/L — ABNORMAL LOW (ref 15–41)
Albumin: 2.2 g/dL — ABNORMAL LOW (ref 3.5–5.0)
Alkaline Phosphatase: 65 U/L (ref 38–126)
Anion gap: 9 (ref 5–15)
BUN: 10 mg/dL (ref 8–23)
CO2: 32 mmol/L (ref 22–32)
Calcium: 8.5 mg/dL — ABNORMAL LOW (ref 8.9–10.3)
Chloride: 100 mmol/L (ref 98–111)
Creatinine, Ser: 0.72 mg/dL (ref 0.44–1.00)
GFR, Estimated: >60 mL/min (ref >60)
Glucose, Bld: 91 mg/dL (ref 70–99)
Potassium: 3.1 mmol/L — ABNORMAL LOW (ref 3.5–5.1)
Sodium: 141 mmol/L (ref 135–145)
Total Bilirubin: 0.6 mg/dL (ref 0.3–1.2)
Total Protein: 6 g/dL — ABNORMAL LOW (ref 6.5–8.1)

## 2022-07-14 LAB — CBC
HCT: 35 % — ABNORMAL LOW (ref 36.0–46.0)
Hemoglobin: 11 g/dL — ABNORMAL LOW (ref 12.0–15.0)
MCH: 26.8 pg (ref 26.0–34.0)
MCHC: 31.4 g/dL (ref 30.0–36.0)
MCV: 85.2 fL (ref 80.0–100.0)
Platelets: 344 10*3/uL (ref 150–400)
RBC: 4.11 MIL/uL (ref 3.87–5.11)
RDW: 14.9 % (ref 11.5–15.5)
WBC: 7.3 10*3/uL (ref 4.0–10.5)
nRBC: 0 % (ref 0.0–0.2)

## 2022-07-14 LAB — URINE CULTURE: Culture: 10000 — AB

## 2022-07-14 LAB — CULTURE, BLOOD (ROUTINE X 2)
Culture: NO GROWTH
Special Requests: ADEQUATE

## 2022-07-14 LAB — GLUCOSE, CAPILLARY: Glucose-Capillary: 134 mg/dL — ABNORMAL HIGH (ref 70–99)

## 2022-07-14 MED ORDER — GABAPENTIN 300 MG PO CAPS: 300.0000 mg | ORAL_CAPSULE | Freq: Three times a day (TID) | ORAL | 0 refills | Status: AC

## 2022-07-14 MED ORDER — CIPROFLOXACIN HCL 500 MG PO TABS: 500.0000 mg | ORAL_TABLET | Freq: Two times a day (BID) | ORAL | 0 refills | Status: AC

## 2022-07-14 NOTE — Care Management Important Message (Signed)
Important Message  Patient Details  Name: BRISEIDA GITTINGS MRN: [id number] Date of Birth: [birth year]   Medicare Important Message Given:  Yes     Corey Harold 07/14/2022, 11:33 AM

## 2022-07-14 NOTE — Discharge Summary (Signed)
Physician Discharge Summary   Patient: [name] MRN: [id number] DOB: 1951-01-15  Admit date:     07/10/2022  Discharge date: 07/14/22  Discharge Physician: Kendell Bane   PCP: Ignatius Specking, MD   Recommendations at discharge:  -Follow-up with PCP and cardiologist in 2-4 weeks Recommending following up with urologist in 1-2 weeks  Discharge Diagnoses: Principal Problem:   Sepsis (HCC) Active Problems:   CAD (coronary artery disease)   Obstructive sleep apnea   Chronic diastolic heart failure (HCC)   CHEST PAIN-UNSPECIFIED   Anxiety and depression   Morbid obesity (HCC)   Essential hypertension, benign   Uncontrolled type 2 diabetes mellitus with hyperglycemia (HCC)  [name]  is a 72 y.o. female,  with medical history significant for diabetes mellitus, OSA, hypertension, diastolic CHF, coronary artery disease.  Admission last February for TIA/CVA, as well family reports she had an admission at [facility] last month for UTI, as well she had a visit to ED in ED yesterday for altered mental status with negative MRI brain. -Patient presents to ED secondary to complaints of chest pain, since last night, that has persisted till this morning, did not improve after receiving her nitro, she was chest pain-free upon my evaluation, she is very poor historian, she did have some fever for last couple days, but denies any upper respiratory symptoms, no dysuria, no polyuria, she does report abdominal pain as well started yesterday, no vomitin.    ED:  she was noted to have fever 102.7, lactic acid elevated at 2, leukocytosis at 14.9, CT abdomen pelvis significant for left pyelonephritis, troponins negative x 2,      Assessment & Plan:     Principal Problem:   Sepsis (HCC) Active Problems:   CAD (coronary artery disease)   Obstructive sleep apnea   Chronic diastolic heart failure (HCC)   CHEST PAIN-UNSPECIFIED   Anxiety and depression   Morbid obesity (HCC)   Essential  hypertension, benign   Uncontrolled type 2 diabetes mellitus with hyperglycemia      Sepsis secondary to left pyelonephritis -Sepsis physiology resolved, -Hemodynamically stable now --afebrile normotensive, improved leukocytosis, -NUU:VOZDGU present on admission,with Tmax of 102.7, lactic acid elevated at 2, leukocytosis at 14.9, -CT abdomen pelvis significant for with left pyonephritis -Urine culture --multiple species-repeating UA -blood cultures--no growth to date -Received IV cefepime initially, will narrow to IV Rocephin given recent urine culture 05/09/2022 showing pansensitive E. Coli -Changing IV antibiotics to ciprofloxacin PO --- for total of 10 days of treatment -Follow-up with urology as an outpatient     Chest pain -Nontypical features, currently chest pain-free, troponins negative x 2, EKG nonacute -Denies of having any further chest pain,   History of CVA/TIA -  continue with aspirin statin, beta-blockers, and Imdur.   CAD -Continue with aspirin, statin, Imdur and Toprol-XL   Essential hypertension, benign Blood pressure stable -We will continue with metoprolol, Imdur including low-dose lisinopril   Chronic diastolic heart failure (HCC) --Resume Lasix -Recommending daily weight, -Diabetic diet   Obstructive sleep apnea On 2 L of O2 at night and during the day as needed -Satting 98% on 2 L of oxygen   Morbid Obesity- Body mass index is 42.91 kg/m. Recommended close follow-up with PCP, referral to weight loss programs   Hypokalemia  -Resolved was repleted   DM2 -Resuming home regimen including metformin -Strict diabetic diet         Skin Assessment: I have examined the patient's skin and I agree  with the wound assessment as performed by wound care team As outlined belowe: Pressure Injury 05/08/22 Toe (Comment  which one) Anterior;Right Stage 1 -  Intact skin with non-blanchable redness of a localized area usually over a bony prominence. (Active)   05/08/22 2000  Location: Toe (Comment  which one)  Location Orientation: Anterior;Right  Staging: Stage 1 -  Intact skin with non-blanchable redness of a localized area usually over a bony prominence.  Wound Description (Comments):   Present on Admission: Yes     Pressure Injury 05/08/22 Toe (Comment  which one) Anterior;Left Stage 1 -  Intact skin with non-blanchable redness of a localized area usually over a bony prominence. (Active)  05/08/22 2000  Location: Toe (Comment  which one)  Location Orientation: Anterior;Left  Staging: Stage 1 -  Intact skin with non-blanchable redness of a localized area usually over a bony prominence.  Wound Description (Comments):   Present on Admission:        Disposition: Home health Diet recommendation:  Discharge Diet Orders (From admission, onward)     Start     Ordered   07/14/22 0000  Diet - low sodium heart healthy        07/14/22 1046           Carb modified diet DISCHARGE MEDICATION: Allergies as of 07/14/2022       Reactions   Amphetamine-dextroamphetamine Swelling   Nitrofuran Derivatives Itching, Swelling   Amphetamine-dextroamphet Er Swelling   Pregabalin Swelling   Topiramate Other (See Comments)   Tongue tingle   Verelan [verapamil] Rash        Medication List     STOP taking these medications    clopidogrel 75 MG tablet Commonly known as: PLAVIX   methocarbamol 500 MG tablet Commonly known as: ROBAXIN   nitrofurantoin 50 MG capsule Commonly known as: MACRODANTIN   promethazine 25 MG tablet Commonly known as: PHENERGAN       TAKE these medications    Accu-Chek Guide Me w/Device Kit 1 Piece by Does not apply route as directed.   acetaminophen 325 MG tablet Commonly known as: TYLENOL Take 2 tablets (650 mg total) by mouth every 4 (four) hours as needed for mild pain (or temp > 37.5 C (99.5 F)).   ALPRAZolam 0.5 MG tablet Commonly known as: XANAX Take 1 tablet (0.5 mg total) by mouth 2 (two)  times daily as needed for anxiety or sleep.   aspirin EC 81 MG tablet Take 1 tablet (81 mg total) by mouth daily with breakfast. -Take Aspirin 81 mg daily along with Plavix 75 mg daily for 21 days then after that STOP the Plavix  and continue ONLY Aspirin 81 mg daily indefinitely--   atorvastatin 40 MG tablet Commonly known as: LIPITOR Take 40 mg by mouth daily. What changed: Another medication with the same name was removed. Continue taking this medication, and follow the directions you see here.   B-D ULTRAFINE III SHORT PEN 31G X 8 MM Misc Generic drug: Insulin Pen Needle Inject into the skin 4 (four) times daily.   buPROPion ER 100 MG 12 hr tablet Commonly known as: WELLBUTRIN SR Take 1 tablet (100 mg total) by mouth daily.   ciprofloxacin 500 MG tablet Commonly known as: CIPRO Take 1 tablet (500 mg total) by mouth 2 (two) times daily for 6 days.   diclofenac Sodium 1 % Gel Commonly known as: VOLTAREN Apply topically.   doxepin 25 MG capsule Commonly known as: SINEQUAN Take 75 mg  by mouth at bedtime.   ferrous sulfate 325 (65 FE) MG EC tablet Take by mouth.   furosemide 40 MG tablet Commonly known as: LASIX Take 1.5 tablets (60 mg total) by mouth daily.   gabapentin 300 MG capsule Commonly known as: NEURONTIN Take 1 capsule (300 mg total) by mouth 3 (three) times daily. What changed:  medication strength how much to take   glipiZIDE 5 MG 24 hr tablet Commonly known as: GLUCOTROL XL Take 5 mg by mouth daily.   Insulin Lispro Prot & Lispro (75-25) 100 UNIT/ML Kwikpen Commonly known as: HUMALOG 75/25 MIX Inject 90 Units into the skin 2 (two) times daily with a meal.   isosorbide mononitrate 30 MG 24 hr tablet Commonly known as: IMDUR TAKE 1/2 TABLET BY MOUTH DAILY (Need TO make APPOINTMENT FOR NEXT refills) What changed: See the new instructions.   lidocaine 5 % ointment Commonly known as: XYLOCAINE Apply topically 2 (two) times daily.   lisinopril 5 MG  tablet Commonly known as: ZESTRIL TAKE 1 TABLET BY MOUTH DAILY (Make APPOINTMENT FOR NEXT refills)   meloxicam 7.5 MG tablet Commonly known as: MOBIC Take 7.5 mg by mouth daily.   metFORMIN 1000 MG tablet Commonly known as: GLUCOPHAGE Take 1 tablet (1,000 mg total) by mouth 2 (two) times daily with a meal.   metoCLOPramide 5 MG tablet Commonly known as: REGLAN Take 5 mg by mouth 3 (three) times daily as needed for nausea or vomiting.   metoprolol succinate 100 MG 24 hr tablet Commonly known as: TOPROL-XL Take 100 mg by mouth daily. Take with or immediately following a meal.   montelukast 10 MG tablet Commonly known as: SINGULAIR Take 10 mg by mouth at bedtime.   multivitamin capsule Take 1 capsule by mouth daily.   naloxone 4 MG/0.1ML Liqd nasal spray kit Commonly known as: NARCAN SMARTSIG:Both Nares   nitroGLYCERIN 0.4 MG SL tablet Commonly known as: NITROSTAT Place 1 tablet (0.4 mg total) under the tongue every 5 (five) minutes as needed for chest pain (up to 3 doses. If taking 3rd dose call 911).   ondansetron 4 MG tablet Commonly known as: ZOFRAN Take 4 mg by mouth every other day.   oxyCODONE-acetaminophen 10-325 MG tablet Commonly known as: PERCOCET Take 1 tablet by mouth every 6 (six) hours as needed for pain.   pantoprazole 40 MG tablet Commonly known as: PROTONIX TAKE 1 TABLET BY MOUTH TWICE DAILY BEFORE MEALS   polyethylene glycol 17 g packet Commonly known as: MIRALAX / GLYCOLAX Take 17 g by mouth daily as needed for moderate constipation.   potassium chloride 10 MEQ tablet Commonly known as: KLOR-CON Take 10 mEq by mouth 2 (two) times daily.   ProAir HFA 108 (90 Base) MCG/ACT inhaler Generic drug: albuterol Inhale 2 puffs into the lungs every 4 (four) hours as needed for wheezing or shortness of breath.   senna-docusate 8.6-50 MG tablet Commonly known as: Senokot-S Take 2 tablets by mouth at bedtime.   SSD 1 % cream Generic drug: silver  sulfADIAZINE Apply topically 2 (two) times daily.   SURE COMFORT INS SYR 1CC/30G 30G X 5/16" 1 ML Misc Generic drug: Insulin Syringe-Needle U-100 1 each by Other route daily.   Vitamin D3 125 MCG (5000 UT) Caps TAKE ONE CAPSULE BY MOUTH DAILY        Discharge Exam: Filed Weights   07/10/22 1302  Weight: 113.4 kg        General:  AAO x 3,  cooperative, no  distress;   HEENT:  Normocephalic, PERRL, otherwise with in Normal limits   Neuro:  CNII-XII intact. , normal motor and sensation, reflexes intact   Lungs:   Clear to auscultation BL, Respirations unlabored,  No wheezes / crackles  Cardio:    S1/S2, RRR, No murmure, No Rubs or Gallops   Abdomen:  Soft, non-tender, bowel sounds active all four quadrants, no guarding or peritoneal signs.  Muscular  skeletal:  Limited exam -global generalized weaknesses - in bed, able to move all 4 extremities,   2+ pulses,  symmetric, No pitting edema  Skin:  Dry, warm to touch, negative for any Rashes,  Wounds: Please see nursing documentation  Pressure Injury 05/08/22 Toe (Comment  which one) Anterior;Right Stage 1 -  Intact skin with non-blanchable redness of a localized area usually over a bony prominence. (Active)  05/08/22 2000  Location: Toe (Comment  which one)  Location Orientation: Anterior;Right  Staging: Stage 1 -  Intact skin with non-blanchable redness of a localized area usually over a bony prominence.  Wound Description (Comments):   Present on Admission: Yes     Pressure Injury 05/08/22 Toe (Comment  which one) Anterior;Left Stage 1 -  Intact skin with non-blanchable redness of a localized area usually over a bony prominence. (Active)  05/08/22 2000  Location: Toe (Comment  which one)  Location Orientation: Anterior;Left  Staging: Stage 1 -  Intact skin with non-blanchable redness of a localized area usually over a bony prominence.  Wound Description (Comments):   Present on Admission:           Condition at  discharge: fair  The results of significant diagnostics from this hospitalization (including imaging, microbiology, ancillary and laboratory) are listed below for reference.   Imaging Studies: CT Angio Chest PE W and/or Wo Contrast  Result Date: 07/10/2022 CLINICAL DATA:  Chest and abdominal pain. EXAM: CT ANGIOGRAPHY CHEST CT ABDOMEN AND PELVIS WITH CONTRAST TECHNIQUE: Multidetector CT imaging of the chest was performed using the standard protocol during bolus administration of intravenous contrast. Multiplanar CT image reconstructions and MIPs were obtained to evaluate the vascular anatomy. Multidetector CT imaging of the abdomen and pelvis was performed using the standard protocol during bolus administration of intravenous contrast. RADIATION DOSE REDUCTION: This exam was performed according to the departmental dose-optimization program which includes automated exposure control, adjustment of the mA and/or kV according to patient size and/or use of iterative reconstruction technique. CONTRAST:  OMNIPAQUE IOHEXOL 350 MG/ML SOLN COMPARISON:  CT abdomen/pelvis 05/09/2022 FINDINGS: CTA CHEST FINDINGS Cardiovascular: Extensive left-sided chest wall collateral vessels are noted. Suspect significant stenosis or partial occlusion of the left subclavian vein. The left brachiocephalic vein is normal. The heart is normal in size. No pericardial effusion. Prominent pericardial and epicardial fat. The aorta is normal in caliber. No dissection. Scattered atherosclerotic calcifications. The branch vessels are patent. The pulmonary arterial tree is suboptimally opacified but no findings to suggest pulmonary embolism. Mediastinum/Nodes: Small scattered mediastinal and hilar lymph nodes but no mass or adenopathy. The esophagus is unremarkable. The thyroid gland is unremarkable. Lungs/Pleura: No significant pulmonary findings. No infiltrates, edema or effusions. Streaky areas of basilar scarring change in atelectasis.  No worrisome pulmonary lesions. No pneumothorax. Musculoskeletal: No breast masses, supraclavicular or axillary adenopathy. The bony thorax is intact. Review of the MIP images confirms the above findings. CT ABDOMEN and PELVIS FINDINGS Hepatobiliary: Diffuse and fairly marked fatty infiltration of the liver but no focal hepatic lesions or intrahepatic biliary dilatation. Gallbladder is  surgically absent. Mild associated common bile duct dilatation. The portal and hepatic veins are patent. Pancreas: No mass, inflammation or ductal dilatation. Spleen: Normal size.  No focal lesions. Adrenals/Urinary Tract: Stable low-attenuation left adrenal gland nodule measuring 22 mm. This measured 13 Hounsfield units on a prior noncontrast abdominal CT scan from 2021 and is consistent with a benign adenoma. No further imaging evaluation or follow-up is necessary. The right adrenal gland is normal. Few patchy areas of decreased perfusion involving the left kidney not present on the prior CT scan and suspicious for pyelonephritis. Mild renal cortical scarring changes involving the right kidney. No renal calculi or hydroureteronephrosis. The bladder is unremarkable. Stomach/Bowel: The stomach, duodenum, small and colon grossly normal. No acute inflammatory process, mass lesions obstructive findings. Vascular/Lymphatic: Moderate atherosclerotic calcifications involving the aorta and branch vessels but no aneurysm or dissection. The major venous structures are patent. No mesenteric or retroperitoneal mass or adenopathy. Reproductive: Surgically absent. The uterus is surgically absent. Both ovaries are still present appear. Other: Marked diffuse bulging and thinning of the anterior abdominal wall without discrete hernia. Musculoskeletal: Significant thoracolumbar scoliosis and degenerative changes. No acute bony findings. 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 Review of the MIP images confirms the above findings. IMPRESSION: 1.  Normal caliber thoracic aorta. No aneurysm or dissection. Scattered aortic and coronary artery calcifications. 2. No acute pulmonary findings. 3. Extensive left-sided chest wall collateral vessels. Suspect significant stenosis or partial occlusion of the left subclavian vein. 4. Few patchy areas of decreased perfusion involving the left kidney not present on the prior CT scan and suspicious for pyelonephritis. Recommend correlation with urinalysis. 5. Diffuse and fairly marked fatty infiltration of the liver. 6. Status post cholecystectomy with mild associated common bile duct dilatation. 7. Stable 22 mm benign left adrenal gland adenoma. 8. Marked diffuse bulging and thinning of the anterior abdominal wall without discrete hernia. 9. 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 10. Aortic atherosclerosis. Aortic Atherosclerosis (ICD10-I70.0). Electronically Signed   By: Rudie Meyer M.D.   On: 07/10/2022 16:45   CT ABDOMEN PELVIS W CONTRAST  Result Date: 07/10/2022 CLINICAL DATA:  Chest and abdominal pain. EXAM: CT ANGIOGRAPHY CHEST CT ABDOMEN AND PELVIS WITH CONTRAST TECHNIQUE: Multidetector CT imaging of the chest was performed using the standard protocol during bolus administration of intravenous contrast. Multiplanar CT image reconstructions and MIPs were obtained to evaluate the vascular anatomy. Multidetector CT imaging of the abdomen and pelvis was performed using the standard protocol during bolus administration of intravenous contrast. RADIATION DOSE REDUCTION: This exam was performed according to the departmental dose-optimization program which includes automated exposure control, adjustment of the mA and/or kV according to patient size and/or use of iterative reconstruction technique. CONTRAST:  OMNIPAQUE IOHEXOL 350 MG/ML SOLN COMPARISON:  CT abdomen/pelvis 05/09/2022 FINDINGS: CTA CHEST FINDINGS Cardiovascular: Extensive left-sided chest wall collateral vessels are noted. Suspect  significant stenosis or partial occlusion of the left subclavian vein. The left brachiocephalic vein is normal. The heart is normal in size. No pericardial effusion. Prominent pericardial and epicardial fat. The aorta is normal in caliber. No dissection. Scattered atherosclerotic calcifications. The branch vessels are patent. The pulmonary arterial tree is suboptimally opacified but no findings to suggest pulmonary embolism. Mediastinum/Nodes: Small scattered mediastinal and hilar lymph nodes but no mass or adenopathy. The esophagus is unremarkable. The thyroid gland is unremarkable. Lungs/Pleura: No significant pulmonary findings. No infiltrates, edema or effusions. Streaky areas of basilar scarring change in atelectasis. No worrisome pulmonary lesions.  No pneumothorax. Musculoskeletal: No breast masses, supraclavicular or axillary adenopathy. The bony thorax is intact. Review of the MIP images confirms the above findings. CT ABDOMEN and PELVIS FINDINGS Hepatobiliary: Diffuse and fairly marked fatty infiltration of the liver but no focal hepatic lesions or intrahepatic biliary dilatation. Gallbladder is surgically absent. Mild associated common bile duct dilatation. The portal and hepatic veins are patent. Pancreas: No mass, inflammation or ductal dilatation. Spleen: Normal size.  No focal lesions. Adrenals/Urinary Tract: Stable low-attenuation left adrenal gland nodule measuring 22 mm. This measured 13 Hounsfield units on a prior noncontrast abdominal CT scan from 2021 and is consistent with a benign adenoma. No further imaging evaluation or follow-up is necessary. The right adrenal gland is normal. Few patchy areas of decreased perfusion involving the left kidney not present on the prior CT scan and suspicious for pyelonephritis. Mild renal cortical scarring changes involving the right kidney. No renal calculi or hydroureteronephrosis. The bladder is unremarkable. Stomach/Bowel: The stomach, duodenum, small and  colon grossly normal. No acute inflammatory process, mass lesions obstructive findings. Vascular/Lymphatic: Moderate atherosclerotic calcifications involving the aorta and branch vessels but no aneurysm or dissection. The major venous structures are patent. No mesenteric or retroperitoneal mass or adenopathy. Reproductive: Surgically absent. The uterus is surgically absent. Both ovaries are still present appear. Other: Marked diffuse bulging and thinning of the anterior abdominal wall without discrete hernia. Musculoskeletal: Significant thoracolumbar scoliosis and degenerative changes. No acute bony findings. 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 Review of the MIP images confirms the above findings. IMPRESSION: 1. Normal caliber thoracic aorta. No aneurysm or dissection. Scattered aortic and coronary artery calcifications. 2. No acute pulmonary findings. 3. Extensive left-sided chest wall collateral vessels. Suspect significant stenosis or partial occlusion of the left subclavian vein. 4. Few patchy areas of decreased perfusion involving the left kidney not present on the prior CT scan and suspicious for pyelonephritis. Recommend correlation with urinalysis. 5. Diffuse and fairly marked fatty infiltration of the liver. 6. Status post cholecystectomy with mild associated common bile duct dilatation. 7. Stable 22 mm benign left adrenal gland adenoma. 8. Marked diffuse bulging and thinning of the anterior abdominal wall without discrete hernia. 9. 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 10. Aortic atherosclerosis. Aortic Atherosclerosis (ICD10-I70.0). Electronically Signed   By: Rudie Meyer M.D.   On: 07/10/2022 16:45   DG Chest Portable 1 View  Result Date: 07/10/2022 CLINICAL DATA:  Chest pain. EXAM: PORTABLE CHEST 1 VIEW COMPARISON:  None Available. FINDINGS: Similar mild enlargement the cardiac silhouette. No consolidation. No visible pleural effusions or pneumothorax. Polyarticular  degenerative change. IMPRESSION: No active disease.  Similar cardiomegaly. Electronically Signed   By: Feliberto Harts M.D.   On: 07/10/2022 13:16    Microbiology: Results for orders placed or performed during the hospital encounter of 07/10/22  Culture, blood (Routine x 2)     Status: None (Preliminary result)   Collection Time: 07/10/22  1:13 PM   Specimen: BLOOD  Result Value Ref Range Status   Specimen Description BLOOD LEFT ARM  Final   Special Requests   Final    BOTTLES DRAWN AEROBIC AND ANAEROBIC Blood Culture results may not be optimal due to an inadequate volume of blood received in culture bottles   Culture   Final    NO GROWTH 4 DAYS Performed at [facility], [address], [place], [place] [address]    Report Status PENDING  Incomplete  Culture, blood (Routine x 2)  Status: None (Preliminary result)   Collection Time: 07/10/22  1:45 PM   Specimen: BLOOD  Result Value Ref Range Status   Specimen Description BLOOD BLOOD RIGHT ARM  Final   Special Requests   Final    BOTTLES DRAWN AEROBIC AND ANAEROBIC Blood Culture adequate volume   Culture   Final    NO GROWTH 4 DAYS Performed at [facility], [address], [place], [place] [address]    Report Status PENDING  Incomplete  Resp panel by RT-PCR (RSV, Flu A&B, Covid) Anterior Nasal Swab     Status: None   Collection Time: 07/10/22  2:00 PM   Specimen: Anterior Nasal Swab  Result Value Ref Range Status   SARS Coronavirus 2 by RT PCR NEGATIVE NEGATIVE Final    Comment: (NOTE) SARS-CoV-2 target nucleic acids are NOT DETECTED.  The SARS-CoV-2 RNA is generally detectable in upper respiratory specimens during the acute phase of infection. The lowest concentration of SARS-CoV-2 viral copies this assay can detect is 138 copies/mL. A negative result does not preclude SARS-Cov-2 infection and should not be used as the sole basis for treatment or other patient management decisions. A negative result may occur with   improper specimen collection/handling, submission of specimen other than nasopharyngeal swab, presence of viral mutation(s) within the areas targeted by this assay, and inadequate number of viral copies(<138 copies/mL). A negative result must be combined with clinical observations, patient history, and epidemiological information. The expected result is Negative.  Fact Sheet for Patients:  BloggerCourse.com  Fact Sheet for Healthcare Providers:  SeriousBroker.it  This test is no t yet approved or cleared by the [place] FDA and  has been authorized for detection and/or diagnosis of SARS-CoV-2 by FDA under an Emergency Use Authorization (EUA). This EUA will remain  in effect (meaning this test can be used) for the duration of the COVID-19 declaration under Section 564(b)(1) of the Act, 21 U.S.C.section 360bbb-3(b)(1), unless the authorization is terminated  or revoked sooner.       Influenza A by PCR NEGATIVE NEGATIVE Final   Influenza B by PCR NEGATIVE NEGATIVE Final    Comment: (NOTE) The Xpert Xpress SARS-CoV-2/FLU/RSV plus assay is intended as an aid in the diagnosis of influenza from Nasopharyngeal swab specimens and should not be used as a sole basis for treatment. Nasal washings and aspirates are unacceptable for Xpert Xpress SARS-CoV-2/FLU/RSV testing.  Fact Sheet for Patients: BloggerCourse.com  Fact Sheet for Healthcare Providers: SeriousBroker.it  This test is not yet approved or cleared by the [place] FDA and has been authorized for detection and/or diagnosis of SARS-CoV-2 by FDA under an Emergency Use Authorization (EUA). This EUA will remain in effect (meaning this test can be used) for the duration of the COVID-19 declaration under Section 564(b)(1) of the Act, 21 U.S.C. section 360bbb-3(b)(1), unless the authorization is terminated or revoked.      Resp Syncytial Virus by PCR NEGATIVE NEGATIVE Final    Comment: (NOTE) Fact Sheet for Patients: BloggerCourse.com  Fact Sheet for Healthcare Providers: SeriousBroker.it  This test is not yet approved or cleared by the [place] FDA and has been authorized for detection and/or diagnosis of SARS-CoV-2 by FDA under an Emergency Use Authorization (EUA). This EUA will remain in effect (meaning this test can be used) for the duration of the COVID-19 declaration under Section 564(b)(1) of the Act, 21 U.S.C. section 360bbb-3(b)(1), unless the authorization is terminated or revoked.  Performed at [facility], [address] [place], [place]  [address]   Urine Culture     Status: Abnormal   Collection Time: 07/10/22  4:20 PM   Specimen: Urine, Clean Catch  Result Value Ref Range Status   Specimen Description   Final    URINE, CLEAN CATCH Performed at [facility], [address], [place], [place] [address]    Special Requests   Final    NONE Performed at [facility], [address], [place], [place] [address]    Culture MULTIPLE SPECIES PRESENT, SUGGEST RECOLLECTION (A)  Final   Report Status 07/11/2022 FINAL  Final  MRSA Next Gen by PCR, Nasal     Status: None   Collection Time: 07/11/22 11:30 AM   Specimen: Nasal Mucosa; Nasal Swab  Result Value Ref Range Status   MRSA by PCR Next Gen NOT DETECTED NOT DETECTED Final    Comment: (NOTE) The GeneXpert MRSA Assay (FDA approved for NASAL specimens only), is one component of a comprehensive MRSA colonization surveillance program. It is not intended to diagnose MRSA infection nor to guide or monitor treatment for MRSA infections. Test performance is not FDA approved in patients less than 7 years old. Performed at [facility], [address], [place], [place] [address]   Urine Culture     Status: Abnormal   Collection Time: 07/12/22  3:35 PM   Specimen: Urine, Random  Result  Value Ref Range Status   Specimen Description   Final    URINE, RANDOM Performed at [facility], [address], [place], [place] [address]    Special Requests   Final    NONE Reflexed from [phone] Performed at [facility], [address], [place], [place] [address]    Culture (A)  Final    10,000 COLONIES/mL MULTIPLE SPECIES PRESENT, SUGGEST RECOLLECTION   Report Status 07/14/2022 FINAL  Final    Labs: CBC: Recent Labs  Lab 07/10/22 1345 07/11/22 0429 07/12/22 0439 07/13/22 0431 07/14/22 0423  WBC 14.9* 14.7* 10.6* 8.7 7.3  NEUTROABS 11.0*  --   --   --   --   HGB 11.8* 12.6 11.0* 10.9* 11.0*  HCT 37.7 41.1 35.6* 34.3* 35.0*  MCV 84.3 85.8 85.8 84.5 85.2  PLT 349 319 300 312 344   Basic Metabolic Panel: Recent Labs  Lab 07/10/22 1345 07/11/22 0429 07/12/22 0439 07/13/22 0431 07/14/22 0423  NA 135 137 136 140 141  K 3.8 3.5 3.5 3.2* 3.1*  CL 96* 98 99 102 100  CO2 32 31 30 31  32  GLUCOSE 220* 287* 271* 134* 91  BUN 16 13 11 10 10   CREATININE 0.95 0.80 0.72 0.62 0.72  CALCIUM 8.5* 8.6* 8.3* 8.4* 8.5*   Liver Function Tests: Recent Labs  Lab 07/10/22 1345 07/12/22 0439 07/13/22 0431 07/14/22 0423  AST 14* 11* 12* 14*  ALT 15 11 11 13   ALKPHOS 69 68 64 65  BILITOT 1.1 0.5 0.6 0.6  PROT 6.4* 5.9* 5.8* 6.0*  ALBUMIN 2.8* 2.3* 2.2* 2.2*   CBG: Recent Labs  Lab 07/13/22 0732 07/13/22 1049 07/13/22 1659 07/13/22 2042 07/14/22 0715  GLUCAP 157* 235* 281* 234* 134*    Discharge time spent: greater than 30 minutes.  Signed: Kendell Bane, MD [facility] 07/14/2022

## 2022-07-14 NOTE — Consult Note (Signed)
[facility] Customer service manager [facility]) Accountable Care Organization (ACO) [facility] Liaison Note  07/14/2022  [name] 02/10/1951 [id number]  Location: [place] Liaison screened the patient remotely at [facility].  Insurance: [employer]   [name] is a 72 y.o. female who is a Primary Care Patient of [name], Angelina Pih, MD [facility]). The patient was screened for readmission hospitalization with noted medium risk score for unplanned readmission risk with 2 IP in 6 months.  The patient was assessed for potential [facility]) Care Management service needs for post hospital transition for care coordination. Review of patient's electronic medical record reveals patient is admitted with Sepsis. [facility] liaison attempted outreach call however unsuccessful.   Plan: [facility] Liaison will continue to follow progress and disposition to asess for post hospital community care coordination/management needs.  Referral request for community care coordination: Will refer due to admitting diagnosis for [facility] RN care coordinator. Pt to be discharged today with [facility] PT/OT/RN involvement.   [facility] Care Management/Population Health does not replace or interfere with any arrangements made by the Inpatient Transition of Care team.   For questions contact:   Elliot Cousin, RN, BSN [facility] Liaison [place]   [facility]  Population Health Office Hours MTWF 8:00 am to 6 pm off on Thursday [phone] mobile [phone] [Office toll free line][facility] Office Hours are M-F 8:30 - 5 pm 24 hour nurse advise line [phone] Conceirge  [email or web address]

## 2022-07-14 NOTE — Progress Notes (Signed)
Nsg Discharge Note  Admit Date:  07/10/2022 Discharge date: 07/14/2022   [name] to be D/C'd Home per MD order.  AVS completed.  Copy for chart, and copy for patient signed, and dated. Patient/caregiver able to verbalize understanding.  Discharge Medication: Allergies as of 07/14/2022       Reactions   Amphetamine-dextroamphetamine Swelling   Nitrofuran Derivatives Itching, Swelling   Amphetamine-dextroamphet Er Swelling   Pregabalin Swelling   Topiramate Other (See Comments)   Tongue tingle   Verelan [verapamil] Rash        Medication List     STOP taking these medications    clopidogrel 75 MG tablet Commonly known as: PLAVIX   methocarbamol 500 MG tablet Commonly known as: ROBAXIN   nitrofurantoin 50 MG capsule Commonly known as: MACRODANTIN   promethazine 25 MG tablet Commonly known as: PHENERGAN       TAKE these medications    Accu-Chek Guide Me w/Device Kit 1 Piece by Does not apply route as directed.   acetaminophen 325 MG tablet Commonly known as: TYLENOL Take 2 tablets (650 mg total) by mouth every 4 (four) hours as needed for mild pain (or temp > 37.5 C (99.5 F)).   ALPRAZolam 0.5 MG tablet Commonly known as: XANAX Take 1 tablet (0.5 mg total) by mouth 2 (two) times daily as needed for anxiety or sleep.   aspirin EC 81 MG tablet Take 1 tablet (81 mg total) by mouth daily with breakfast. -Take Aspirin 81 mg daily along with Plavix 75 mg daily for 21 days then after that STOP the Plavix  and continue ONLY Aspirin 81 mg daily indefinitely--   atorvastatin 40 MG tablet Commonly known as: LIPITOR Take 40 mg by mouth daily. What changed: Another medication with the same name was removed. Continue taking this medication, and follow the directions you see here.   B-D ULTRAFINE III SHORT PEN 31G X 8 MM Misc Generic drug: Insulin Pen Needle Inject into the skin 4 (four) times daily.   buPROPion ER 100 MG 12 hr tablet Commonly known as:  WELLBUTRIN SR Take 1 tablet (100 mg total) by mouth daily.   ciprofloxacin 500 MG tablet Commonly known as: CIPRO Take 1 tablet (500 mg total) by mouth 2 (two) times daily for 6 days.   diclofenac Sodium 1 % Gel Commonly known as: VOLTAREN Apply topically.   doxepin 25 MG capsule Commonly known as: SINEQUAN Take 75 mg by mouth at bedtime.   ferrous sulfate 325 (65 FE) MG EC tablet Take by mouth.   furosemide 40 MG tablet Commonly known as: LASIX Take 1.5 tablets (60 mg total) by mouth daily.   gabapentin 300 MG capsule Commonly known as: NEURONTIN Take 1 capsule (300 mg total) by mouth 3 (three) times daily. What changed:  medication strength how much to take   glipiZIDE 5 MG 24 hr tablet Commonly known as: GLUCOTROL XL Take 5 mg by mouth daily.   Insulin Lispro Prot & Lispro (75-25) 100 UNIT/ML Kwikpen Commonly known as: HUMALOG 75/25 MIX Inject 90 Units into the skin 2 (two) times daily with a meal.   isosorbide mononitrate 30 MG 24 hr tablet Commonly known as: IMDUR TAKE 1/2 TABLET BY MOUTH DAILY (Need TO make APPOINTMENT FOR NEXT refills) What changed: See the new instructions.   lidocaine 5 % ointment Commonly known as: XYLOCAINE Apply topically 2 (two) times daily.   lisinopril 5 MG tablet Commonly known as: ZESTRIL TAKE 1 TABLET BY MOUTH DAILY (  Make APPOINTMENT FOR NEXT refills)   meloxicam 7.5 MG tablet Commonly known as: MOBIC Take 7.5 mg by mouth daily.   metFORMIN 1000 MG tablet Commonly known as: GLUCOPHAGE Take 1 tablet (1,000 mg total) by mouth 2 (two) times daily with a meal.   metoCLOPramide 5 MG tablet Commonly known as: REGLAN Take 5 mg by mouth 3 (three) times daily as needed for nausea or vomiting.   metoprolol succinate 100 MG 24 hr tablet Commonly known as: TOPROL-XL Take 100 mg by mouth daily. Take with or immediately following a meal.   montelukast 10 MG tablet Commonly known as: SINGULAIR Take 10 mg by mouth at bedtime.    multivitamin capsule Take 1 capsule by mouth daily.   naloxone 4 MG/0.1ML Liqd nasal spray kit Commonly known as: NARCAN SMARTSIG:Both Nares   nitroGLYCERIN 0.4 MG SL tablet Commonly known as: NITROSTAT Place 1 tablet (0.4 mg total) under the tongue every 5 (five) minutes as needed for chest pain (up to 3 doses. If taking 3rd dose call 911).   ondansetron 4 MG tablet Commonly known as: ZOFRAN Take 4 mg by mouth every other day.   oxyCODONE-acetaminophen 10-325 MG tablet Commonly known as: PERCOCET Take 1 tablet by mouth every 6 (six) hours as needed for pain.   pantoprazole 40 MG tablet Commonly known as: PROTONIX TAKE 1 TABLET BY MOUTH TWICE DAILY BEFORE MEALS   polyethylene glycol 17 g packet Commonly known as: MIRALAX / GLYCOLAX Take 17 g by mouth daily as needed for moderate constipation.   potassium chloride 10 MEQ tablet Commonly known as: KLOR-CON Take 10 mEq by mouth 2 (two) times daily.   ProAir HFA 108 (90 Base) MCG/ACT inhaler Generic drug: albuterol Inhale 2 puffs into the lungs every 4 (four) hours as needed for wheezing or shortness of breath.   senna-docusate 8.6-50 MG tablet Commonly known as: Senokot-S Take 2 tablets by mouth at bedtime.   SSD 1 % cream Generic drug: silver sulfADIAZINE Apply topically 2 (two) times daily.   SURE COMFORT INS SYR 1CC/30G 30G X 5/16" 1 ML Misc Generic drug: Insulin Syringe-Needle U-100 1 each by Other route daily.   Vitamin D3 125 MCG (5000 UT) Caps TAKE ONE CAPSULE BY MOUTH DAILY        Discharge Assessment: Vitals:   07/13/22 2041 07/14/22 0336  BP: (!) 151/63 (!) 172/73  Pulse: 76 77  Resp: 18 16  Temp: 98.4 F (36.9 C) 97.9 F (36.6 C)  SpO2: 95% 98%   Skin clean, dry and intact without evidence of skin break down, no evidence of skin tears noted. IV catheter discontinued intact. Site without signs and symptoms of complications - no redness or edema noted at insertion site, patient denies c/o pain  - only slight tenderness at site.  Dressing with slight pressure applied.  D/c Instructions-Education: Discharge instructions given to patient/family with verbalized understanding. D/c education completed with patient/family including follow up instructions, medication list, d/c activities limitations if indicated, with other d/c instructions as indicated by MD - patient able to verbalize understanding, all questions fully answered. Patient instructed to return to ED, call 911, or call MD for any changes in condition.  Patient escorted via WC, and D/C home via private auto.  Demetrio Lapping, LPN 1/61/0960 45:40 AM

## 2022-07-14 NOTE — Evaluation (Signed)
Physical Therapy Evaluation Patient Details Name: [name] MRN: [id number] DOB: [birth year] Today's Date: 07/14/2022  History of Present Illness  [name]  is a 72 y.o. female,  with medical history significant for diabetes mellitus, OSA, hypertension, diastolic CHF, coronary artery disease.  Admission last February for TIA/CVA, as well family reports she had an admission at [facility] last month for UTI, as well she had a visit to ED in ED yesterday for altered mental status with negative MRI brain.  -Patient presents to ED secondary to complaints of chest pain, since last night, that has persisted till this morning, did not improve after receiving her nitro, she was chest pain-free upon my evaluation, she is very poor historian, she did have some fever for last couple days, but denies any upper respiratory symptoms, no dysuria, no polyuria, she does report abdominal pain as well started yesterday, no vomitin.   Clinical Impression  Patient functioning near baseline for functional mobility and gait demonstrating good return for bed mobility, transfers and ambulating in room/hallway without loss of balance.  Plan:  Patient discharged from physical therapy to care of nursing for ambulation daily as tolerated for length of stay.         Recommendations for follow up therapy are one component of a multi-disciplinary discharge planning process, led by the attending physician.  Recommendations may be updated based on patient status, additional functional criteria and insurance authorization.  Follow Up Recommendations       Assistance Recommended at Discharge Set up Supervision/Assistance  Patient can return home with the following  Help with stairs or ramp for entrance;Assistance with cooking/housework    Equipment Recommendations None recommended by PT  Recommendations for Other Services       Functional Status Assessment Patient has had a recent decline in their functional  status and/or demonstrates limited ability to make significant improvements in function in a reasonable and predictable amount of time     Precautions / Restrictions Precautions Precautions: Fall Restrictions Weight Bearing Restrictions: No      Mobility  Bed Mobility Overal bed mobility: Modified Independent                  Transfers Overall transfer level: Modified independent                      Ambulation/Gait Ambulation/Gait assistance: Modified independent (Device/Increase time) Gait Distance (Feet): 100 Feet Assistive device: Rolling walker (2 wheels) Gait Pattern/deviations: WFL(Within Functional Limits) Gait velocity: decreased     General Gait Details: grossly WFL with good return for ambulation in room, hallway without loss of balance  Stairs            Wheelchair Mobility    Modified Rankin (Stroke Patients Only)       Balance Overall balance assessment: Needs assistance Sitting-balance support: Feet supported Sitting balance-Leahy Scale: Good Sitting balance - Comments: seated at EOB   Standing balance support: During functional activity, Bilateral upper extremity supported Standing balance-Leahy Scale: Fair Standing balance comment: fair/good using RW                             Pertinent Vitals/Pain Pain Assessment Pain Assessment: No/denies pain    Home Living Family/patient expects to be discharged to:: Private residence Living Arrangements: Spouse/significant other Available Help at Discharge: Family;Personal care attendant Type of Home: House Home Access: Ramped entrance       Home  Layout: One level Home Equipment: Rollator (4 wheels);BSC/3in1;Shower seat;Wheelchair - manual      Prior Function Prior Level of Function : Needs assist       Physical Assist : Mobility (physical);ADLs (physical) Mobility (physical): Bed mobility;Transfers;Gait;Stairs   Mobility Comments: Patient states household  ambulation with rollator ADLs Comments: assisted by aid with all ADL, ait 5x/week for 8 hours/day     Hand Dominance        Extremity/Trunk Assessment   Upper Extremity Assessment Upper Extremity Assessment: Overall WFL for tasks assessed    Lower Extremity Assessment Lower Extremity Assessment: Overall WFL for tasks assessed    Cervical / Trunk Assessment Cervical / Trunk Assessment: Normal  Communication   Communication: No difficulties  Cognition Arousal/Alertness: Awake/alert Behavior During Therapy: WFL for tasks assessed/performed Overall Cognitive Status: Within Functional Limits for tasks assessed                                          General Comments      Exercises     Assessment/Plan    PT Assessment Patient does not need any further PT services  PT Problem List         PT Treatment Interventions      PT Goals (Current goals can be found in the Care Plan section)  Acute Rehab PT Goals Patient Stated Goal: return home with family, home aides to assist PT Goal Formulation: With patient Time For Goal Achievement: 07/14/22 Potential to Achieve Goals: Good    Frequency       Co-evaluation               AM-PAC PT "6 Clicks" Mobility  Outcome Measure Help needed turning from your back to your side while in a flat bed without using bedrails?: None Help needed moving from lying on your back to sitting on the side of a flat bed without using bedrails?: None Help needed moving to and from a bed to a chair (including a wheelchair)?: None Help needed standing up from a chair using your arms (e.g., wheelchair or bedside chair)?: None Help needed to walk in hospital room?: None Help needed climbing 3-5 steps with a railing? : A Little 6 Click Score: 23    End of Session   Activity Tolerance: Patient tolerated treatment well Patient left: in chair;with call bell/phone within reach Nurse Communication: Mobility status PT Visit  Diagnosis: Unsteadiness on feet (R26.81);Other abnormalities of gait and mobility (R26.89);Muscle weakness (generalized) (M62.81)    Time: 0945-1000 PT Time Calculation (min) (ACUTE ONLY): 15 min   Charges:   PT Evaluation $PT Eval Low Complexity: 1 Low PT Treatments $Therapeutic Activity: 8-22 mins        12:23 PM, 07/14/22 Ocie Bob, MPT Physical Therapist with [facility] [phone] office [phone] mobile phone

## 2022-07-14 NOTE — TOC Transition Note (Signed)
Transition of Care [facility]) - CM/SW Discharge Note   Patient Details  Name: [name] MRN: [id number] Date of Birth: 07-06-1950  Transition of [facility]) CM/SW Contact:  Leitha Bleak, RN Phone Number: 07/14/2022, 1:09 PM   Clinical Narrative:   Patient discharged home. Confirmed [facility] accepted and orders are placed. [facility] did accept yesterday.     Final next level of care: Home w Home Health Services Barriers to Discharge: Continued Medical Work up   Patient Goals and CMS Choice      Discharge Placement               Patient and family notified of of transfer: 07/14/22  Discharge Plan and Services Additional resources added to the After Visit Summary for                      Social Determinants of Health (SDOH) Interventions SDOH Screenings   Food Insecurity: No Food Insecurity (05/08/2022)  Housing: Low Risk  (05/08/2022)  Transportation Needs: No Transportation Needs (05/08/2022)  Utilities: Not At Risk (05/08/2022)  Depression (PHQ2-9): Medium Risk (10/17/2021)  Tobacco Use: Low Risk  (07/03/2022)     Readmission Risk Interventions     No data to display

## 2022-07-15 ENCOUNTER — Other Ambulatory Visit: Payer: Self-pay | Admitting: Cardiology

## 2022-07-15 LAB — CULTURE, BLOOD (ROUTINE X 2)

## 2022-07-16 ENCOUNTER — Telehealth: Payer: Self-pay | Admitting: *Deleted

## 2022-07-16 DIAGNOSIS — E7800 Pure hypercholesterolemia, unspecified: Secondary | ICD-10-CM | POA: Diagnosis not present

## 2022-07-16 DIAGNOSIS — I4891 Unspecified atrial fibrillation: Secondary | ICD-10-CM | POA: Diagnosis not present

## 2022-07-16 DIAGNOSIS — G4733 Obstructive sleep apnea (adult) (pediatric): Secondary | ICD-10-CM | POA: Diagnosis not present

## 2022-07-16 DIAGNOSIS — D649 Anemia, unspecified: Secondary | ICD-10-CM | POA: Diagnosis not present

## 2022-07-16 DIAGNOSIS — G8929 Other chronic pain: Secondary | ICD-10-CM | POA: Diagnosis not present

## 2022-07-16 DIAGNOSIS — I69320 Aphasia following cerebral infarction: Secondary | ICD-10-CM | POA: Diagnosis not present

## 2022-07-16 DIAGNOSIS — M1990 Unspecified osteoarthritis, unspecified site: Secondary | ICD-10-CM | POA: Diagnosis not present

## 2022-07-16 DIAGNOSIS — Z7982 Long term (current) use of aspirin: Secondary | ICD-10-CM | POA: Diagnosis not present

## 2022-07-16 DIAGNOSIS — K219 Gastro-esophageal reflux disease without esophagitis: Secondary | ICD-10-CM | POA: Diagnosis not present

## 2022-07-16 DIAGNOSIS — I110 Hypertensive heart disease with heart failure: Secondary | ICD-10-CM | POA: Diagnosis not present

## 2022-07-16 DIAGNOSIS — I69354 Hemiplegia and hemiparesis following cerebral infarction affecting left non-dominant side: Secondary | ICD-10-CM | POA: Diagnosis not present

## 2022-07-16 DIAGNOSIS — I255 Ischemic cardiomyopathy: Secondary | ICD-10-CM | POA: Diagnosis not present

## 2022-07-16 DIAGNOSIS — K5900 Constipation, unspecified: Secondary | ICD-10-CM | POA: Diagnosis not present

## 2022-07-16 DIAGNOSIS — H538 Other visual disturbances: Secondary | ICD-10-CM | POA: Diagnosis not present

## 2022-07-16 DIAGNOSIS — I2510 Atherosclerotic heart disease of native coronary artery without angina pectoris: Secondary | ICD-10-CM | POA: Diagnosis not present

## 2022-07-16 DIAGNOSIS — E559 Vitamin D deficiency, unspecified: Secondary | ICD-10-CM | POA: Diagnosis not present

## 2022-07-16 DIAGNOSIS — E1142 Type 2 diabetes mellitus with diabetic polyneuropathy: Secondary | ICD-10-CM | POA: Diagnosis not present

## 2022-07-16 DIAGNOSIS — J449 Chronic obstructive pulmonary disease, unspecified: Secondary | ICD-10-CM | POA: Diagnosis not present

## 2022-07-16 DIAGNOSIS — Z7902 Long term (current) use of antithrombotics/antiplatelets: Secondary | ICD-10-CM | POA: Diagnosis not present

## 2022-07-16 DIAGNOSIS — R4781 Slurred speech: Secondary | ICD-10-CM | POA: Diagnosis not present

## 2022-07-16 DIAGNOSIS — I5032 Chronic diastolic (congestive) heart failure: Secondary | ICD-10-CM | POA: Diagnosis not present

## 2022-07-16 NOTE — Progress Notes (Signed)
  Care Coordination   Note   07/16/2022 Name: [name] MRN: [id number] DOB: Feb 20, 1951  [name] is a 72 y.o. year old female who sees [name], Dhruv B, MD for primary care. I reached out to [name] by phone today to offer care coordination services.  [name] was given information about Care Coordination services today including:   The Care Coordination services include support from the care team which includes your Nurse Coordinator, Clinical Social Worker, or Pharmacist.  The Care Coordination team is here to help remove barriers to the health concerns and goals most important to you. Care Coordination services are voluntary, and the patient may decline or stop services at any time by request to their care team member.   Care Coordination Consent Status: Patient agreed to services and verbal consent obtained.   Follow up plan:  Telephone appointment with care coordination team member scheduled for:  07/21/22  Encounter Outcome:  Pt. Scheduled  [facility] Coordination Care Guide  Direct Dial: [phone]

## 2022-07-17 DIAGNOSIS — K5900 Constipation, unspecified: Secondary | ICD-10-CM | POA: Diagnosis not present

## 2022-07-17 DIAGNOSIS — E559 Vitamin D deficiency, unspecified: Secondary | ICD-10-CM | POA: Diagnosis not present

## 2022-07-17 DIAGNOSIS — M1990 Unspecified osteoarthritis, unspecified site: Secondary | ICD-10-CM | POA: Diagnosis not present

## 2022-07-17 DIAGNOSIS — E7800 Pure hypercholesterolemia, unspecified: Secondary | ICD-10-CM | POA: Diagnosis not present

## 2022-07-17 DIAGNOSIS — I5032 Chronic diastolic (congestive) heart failure: Secondary | ICD-10-CM | POA: Diagnosis not present

## 2022-07-17 DIAGNOSIS — R4781 Slurred speech: Secondary | ICD-10-CM | POA: Diagnosis not present

## 2022-07-17 DIAGNOSIS — G8929 Other chronic pain: Secondary | ICD-10-CM | POA: Diagnosis not present

## 2022-07-17 DIAGNOSIS — I2510 Atherosclerotic heart disease of native coronary artery without angina pectoris: Secondary | ICD-10-CM | POA: Diagnosis not present

## 2022-07-17 DIAGNOSIS — Z7902 Long term (current) use of antithrombotics/antiplatelets: Secondary | ICD-10-CM | POA: Diagnosis not present

## 2022-07-17 DIAGNOSIS — I255 Ischemic cardiomyopathy: Secondary | ICD-10-CM | POA: Diagnosis not present

## 2022-07-17 DIAGNOSIS — I110 Hypertensive heart disease with heart failure: Secondary | ICD-10-CM | POA: Diagnosis not present

## 2022-07-17 DIAGNOSIS — J449 Chronic obstructive pulmonary disease, unspecified: Secondary | ICD-10-CM | POA: Diagnosis not present

## 2022-07-17 DIAGNOSIS — K219 Gastro-esophageal reflux disease without esophagitis: Secondary | ICD-10-CM | POA: Diagnosis not present

## 2022-07-17 DIAGNOSIS — E1142 Type 2 diabetes mellitus with diabetic polyneuropathy: Secondary | ICD-10-CM | POA: Diagnosis not present

## 2022-07-17 DIAGNOSIS — D649 Anemia, unspecified: Secondary | ICD-10-CM | POA: Diagnosis not present

## 2022-07-17 DIAGNOSIS — I69354 Hemiplegia and hemiparesis following cerebral infarction affecting left non-dominant side: Secondary | ICD-10-CM | POA: Diagnosis not present

## 2022-07-17 DIAGNOSIS — Z7982 Long term (current) use of aspirin: Secondary | ICD-10-CM | POA: Diagnosis not present

## 2022-07-17 DIAGNOSIS — I69320 Aphasia following cerebral infarction: Secondary | ICD-10-CM | POA: Diagnosis not present

## 2022-07-17 DIAGNOSIS — H538 Other visual disturbances: Secondary | ICD-10-CM | POA: Diagnosis not present

## 2022-07-17 DIAGNOSIS — G4733 Obstructive sleep apnea (adult) (pediatric): Secondary | ICD-10-CM | POA: Diagnosis not present

## 2022-07-17 DIAGNOSIS — I4891 Unspecified atrial fibrillation: Secondary | ICD-10-CM | POA: Diagnosis not present

## 2022-07-18 DIAGNOSIS — Z09 Encounter for follow-up examination after completed treatment for conditions other than malignant neoplasm: Secondary | ICD-10-CM | POA: Diagnosis not present

## 2022-07-18 DIAGNOSIS — Z299 Encounter for prophylactic measures, unspecified: Secondary | ICD-10-CM | POA: Diagnosis not present

## 2022-07-18 DIAGNOSIS — I10 Essential (primary) hypertension: Secondary | ICD-10-CM | POA: Diagnosis not present

## 2022-07-21 ENCOUNTER — Encounter: Payer: Self-pay | Admitting: *Deleted

## 2022-07-21 ENCOUNTER — Ambulatory Visit: Payer: Self-pay | Admitting: *Deleted

## 2022-07-21 NOTE — Patient Outreach (Signed)
Care Coordination   Initial Visit Note   07/21/2022 Name: [name] MRN: [id number] DOB: 1950-08-24  [name] is a 72 y.o. year old female who sees [name], Dhruv B, MD for primary care. I spoke with  [name] by phone today.  What matters to the patients health and wellness today?  Improving since hospital discharge, preventing hospitalizations, scheduling a post discharge urology follow-up    Goals Addressed             This Visit's Progress    Manage CHF       Care Coordination Goals: Patient will take medications as directed Patient will keep all medical appointments Patient will notify provider of any new or worsening symptoms Patient will check and record blood pressure daily and reach out to provider with any readings outside of recommended range Patient will check and record weight each morning after urinating and will call cardiologist with weight gain of >2 lbs overnight of >5 lbs in one week Patient will follow a low sodium heart healthy diet Patient will call RN Care Coordinator [phone] with any resource or care coordination needs     Manage Diabetes       Care Coordination Goals: Patient will follow-up with PCP every 3 months or as recommended Patient will take medication as prescribed and reach out to provider with any negative side effects Patient will continue to monitor and record blood sugar 4 times per day and as needed with glucometer, and will call PCP with any readings outside of recommended range Patient will take blood sugar log and meter to provider visits for review Patient will follow a modified carbohydrate diet and decrease simple carbohydrates and sugars Patient will increase activity level as tolerated with an ultimate goal of at least 150 minutes of exercise per week Patient will check feet daily for sores, wounds, calluses, etc and will notify provider of any abnormal findings Patient will have yearly eye exams to check for or  monitor diabetic retinopathy Patient will reach out to RN Care Coordinator [phone] with any care coordination or resource needs      Prevent Hospitalizations       Care Coordination Goals: Patient will take medications as prescribed Patient will follow-up with PCP as directed Patient will keep appointment with cardiologist on 08/01/22 Patient will schedule appointment with urologist in [place] Patient will use assistive devices for ambulation and ADLs Patient will report any new or worsening symptoms to provider Patient will continue to work with [facility] Patient will follow a low sodium heart healthy diet Patient will reach out to RN Care Coordinator [phone] as needed        SDOH assessments and interventions completed:  Yes  SDOH Interventions Today    Flowsheet Row Most Recent Value  SDOH Interventions   Food Insecurity Interventions Intervention Not Indicated  Housing Interventions Intervention Not Indicated  Transportation Interventions Patient Resources (Friends/Family)  Utilities Interventions Intervention Not Indicated  Financial Strain Interventions Intervention Not Indicated  Physical Activity Interventions Other (Comments)  [currently working with Home Health PT but is not doing moderate or strenuous exercise]        Care Coordination Interventions:  Yes, provided  Interventions Today    Flowsheet Row Most Recent Value  Chronic Disease   Chronic disease during today's visit Diabetes, Congestive Heart Failure (CHF)  General Interventions   General Interventions Discussed/Reviewed General Interventions Discussed, General Interventions Reviewed, Labs, Doctor Visits, Annual Foot Exam, Durable Medical  Equipment (DME), Community Resources  [verified [facility] PT. Patient has a CNA that comes Mon-Fri 7am-3pm]  Labs Hgb A1c every 3 months, Kidney Function  Doctor Visits Discussed/Reviewed Doctor Visits Discussed, Specialist,  Doctor Visits Reviewed, PCP  Annabell Sabal and discussed recent hospitalization for sepsis secondary to UTI. Advised to f/u with urologist. discussed recent PCP visit. Reviewed upcoming cardio, neuro, and pain management appointments]  Durable Medical Equipment (DME) Bed side commode, Walker, BP Cuff, Glucomoter, Shower bench  PCP/Specialist Visits Compliance with follow-up visit  Exercise Interventions   Exercise Discussed/Reviewed Exercise Discussed, Exercise Reviewed, Physical Activity, Assistive device use and maintanence  [working with home health PT]  Physical Activity Discussed/Reviewed Physical Activity Discussed, Physical Activity Reviewed  Education Interventions   Education Provided Provided Printed Education, Provided Education  [Secure email with contact information for local urologists. Patient will call to schedule hosp f/u]  Provided Verbal Education On Nutrition, Labs, Blood Sugar Monitoring, Mental Health/Coping with Illness, When to see the doctor, [facility], Medication, Exercise  Labs Reviewed Hgb A1c, Kidney Function  Nutrition Interventions   Nutrition Discussed/Reviewed Nutrition Discussed, Nutrition Reviewed, Decreasing salt, Carbohydrate meal planning, Portion sizes, Fluid intake  Pharmacy Interventions   Pharmacy Dicussed/Reviewed Pharmacy Topics Discussed, Pharmacy Topics Reviewed, Medications and their functions  Safety Interventions   Safety Discussed/Reviewed Safety Discussed, Fall Risk, Safety Reviewed, Home Safety  Home Safety Assistive Devices       Follow up plan: Follow up call scheduled for 08/05/22    Encounter Outcome:  Pt. Visit Completed   Demetrios Loll, BSN, RN-BC RN Care Coordinator [facility]  [facility] Direct Dial: [phone] Main #: [phone]

## 2022-07-22 DIAGNOSIS — Z794 Long term (current) use of insulin: Secondary | ICD-10-CM | POA: Diagnosis not present

## 2022-07-22 DIAGNOSIS — Z9181 History of falling: Secondary | ICD-10-CM | POA: Diagnosis not present

## 2022-07-22 DIAGNOSIS — I5033 Acute on chronic diastolic (congestive) heart failure: Secondary | ICD-10-CM | POA: Diagnosis not present

## 2022-07-22 DIAGNOSIS — Z8673 Personal history of transient ischemic attack (TIA), and cerebral infarction without residual deficits: Secondary | ICD-10-CM | POA: Diagnosis not present

## 2022-07-22 DIAGNOSIS — Z7984 Long term (current) use of oral hypoglycemic drugs: Secondary | ICD-10-CM | POA: Diagnosis not present

## 2022-07-22 DIAGNOSIS — I110 Hypertensive heart disease with heart failure: Secondary | ICD-10-CM | POA: Diagnosis not present

## 2022-07-22 DIAGNOSIS — N3000 Acute cystitis without hematuria: Secondary | ICD-10-CM | POA: Diagnosis not present

## 2022-07-22 DIAGNOSIS — E1165 Type 2 diabetes mellitus with hyperglycemia: Secondary | ICD-10-CM | POA: Diagnosis not present

## 2022-07-22 DIAGNOSIS — F32A Depression, unspecified: Secondary | ICD-10-CM | POA: Diagnosis not present

## 2022-07-22 DIAGNOSIS — G4733 Obstructive sleep apnea (adult) (pediatric): Secondary | ICD-10-CM | POA: Diagnosis not present

## 2022-07-22 DIAGNOSIS — E1142 Type 2 diabetes mellitus with diabetic polyneuropathy: Secondary | ICD-10-CM | POA: Diagnosis not present

## 2022-07-22 DIAGNOSIS — N10 Acute tubulo-interstitial nephritis: Secondary | ICD-10-CM | POA: Diagnosis not present

## 2022-07-22 DIAGNOSIS — E1143 Type 2 diabetes mellitus with diabetic autonomic (poly)neuropathy: Secondary | ICD-10-CM | POA: Diagnosis not present

## 2022-07-24 DIAGNOSIS — K219 Gastro-esophageal reflux disease without esophagitis: Secondary | ICD-10-CM | POA: Diagnosis not present

## 2022-07-24 DIAGNOSIS — G8929 Other chronic pain: Secondary | ICD-10-CM | POA: Diagnosis not present

## 2022-07-24 DIAGNOSIS — I5032 Chronic diastolic (congestive) heart failure: Secondary | ICD-10-CM | POA: Diagnosis not present

## 2022-07-24 DIAGNOSIS — E559 Vitamin D deficiency, unspecified: Secondary | ICD-10-CM | POA: Diagnosis not present

## 2022-07-24 DIAGNOSIS — I2510 Atherosclerotic heart disease of native coronary artery without angina pectoris: Secondary | ICD-10-CM | POA: Diagnosis not present

## 2022-07-24 DIAGNOSIS — R4781 Slurred speech: Secondary | ICD-10-CM | POA: Diagnosis not present

## 2022-07-24 DIAGNOSIS — G4733 Obstructive sleep apnea (adult) (pediatric): Secondary | ICD-10-CM | POA: Diagnosis not present

## 2022-07-24 DIAGNOSIS — E7800 Pure hypercholesterolemia, unspecified: Secondary | ICD-10-CM | POA: Diagnosis not present

## 2022-07-24 DIAGNOSIS — K5900 Constipation, unspecified: Secondary | ICD-10-CM | POA: Diagnosis not present

## 2022-07-24 DIAGNOSIS — Z7902 Long term (current) use of antithrombotics/antiplatelets: Secondary | ICD-10-CM | POA: Diagnosis not present

## 2022-07-24 DIAGNOSIS — I110 Hypertensive heart disease with heart failure: Secondary | ICD-10-CM | POA: Diagnosis not present

## 2022-07-24 DIAGNOSIS — E1142 Type 2 diabetes mellitus with diabetic polyneuropathy: Secondary | ICD-10-CM | POA: Diagnosis not present

## 2022-07-24 DIAGNOSIS — I69354 Hemiplegia and hemiparesis following cerebral infarction affecting left non-dominant side: Secondary | ICD-10-CM | POA: Diagnosis not present

## 2022-07-24 DIAGNOSIS — I69320 Aphasia following cerebral infarction: Secondary | ICD-10-CM | POA: Diagnosis not present

## 2022-07-24 DIAGNOSIS — H538 Other visual disturbances: Secondary | ICD-10-CM | POA: Diagnosis not present

## 2022-07-24 DIAGNOSIS — I10 Essential (primary) hypertension: Secondary | ICD-10-CM | POA: Diagnosis not present

## 2022-07-24 DIAGNOSIS — Z7982 Long term (current) use of aspirin: Secondary | ICD-10-CM | POA: Diagnosis not present

## 2022-07-24 DIAGNOSIS — D649 Anemia, unspecified: Secondary | ICD-10-CM | POA: Diagnosis not present

## 2022-07-24 DIAGNOSIS — I4891 Unspecified atrial fibrillation: Secondary | ICD-10-CM | POA: Diagnosis not present

## 2022-07-24 DIAGNOSIS — J449 Chronic obstructive pulmonary disease, unspecified: Secondary | ICD-10-CM | POA: Diagnosis not present

## 2022-07-24 DIAGNOSIS — I255 Ischemic cardiomyopathy: Secondary | ICD-10-CM | POA: Diagnosis not present

## 2022-07-24 DIAGNOSIS — M1990 Unspecified osteoarthritis, unspecified site: Secondary | ICD-10-CM | POA: Diagnosis not present

## 2022-07-24 DIAGNOSIS — R519 Headache, unspecified: Secondary | ICD-10-CM | POA: Diagnosis not present

## 2022-07-25 ENCOUNTER — Ambulatory Visit: Payer: Self-pay | Admitting: Nurse Practitioner

## 2022-07-28 ENCOUNTER — Encounter: Payer: 59 | Attending: Registered Nurse | Admitting: Registered Nurse

## 2022-07-28 ENCOUNTER — Telehealth: Payer: Self-pay | Admitting: *Deleted

## 2022-07-28 ENCOUNTER — Encounter: Payer: Self-pay | Admitting: Registered Nurse

## 2022-07-28 VITALS — BP 153/83 | HR 73 | Ht 64.0 in | Wt 252.0 lb

## 2022-07-28 DIAGNOSIS — M542 Cervicalgia: Secondary | ICD-10-CM | POA: Diagnosis not present

## 2022-07-28 DIAGNOSIS — M7061 Trochanteric bursitis, right hip: Secondary | ICD-10-CM | POA: Diagnosis not present

## 2022-07-28 DIAGNOSIS — M546 Pain in thoracic spine: Secondary | ICD-10-CM | POA: Insufficient documentation

## 2022-07-28 DIAGNOSIS — M48062 Spinal stenosis, lumbar region with neurogenic claudication: Secondary | ICD-10-CM

## 2022-07-28 DIAGNOSIS — M25512 Pain in left shoulder: Secondary | ICD-10-CM | POA: Insufficient documentation

## 2022-07-28 DIAGNOSIS — M5416 Radiculopathy, lumbar region: Secondary | ICD-10-CM | POA: Diagnosis not present

## 2022-07-28 DIAGNOSIS — M5412 Radiculopathy, cervical region: Secondary | ICD-10-CM | POA: Diagnosis not present

## 2022-07-28 DIAGNOSIS — G8929 Other chronic pain: Secondary | ICD-10-CM | POA: Insufficient documentation

## 2022-07-28 DIAGNOSIS — N12 Tubulo-interstitial nephritis, not specified as acute or chronic: Secondary | ICD-10-CM | POA: Diagnosis not present

## 2022-07-28 DIAGNOSIS — M1712 Unilateral primary osteoarthritis, left knee: Secondary | ICD-10-CM | POA: Insufficient documentation

## 2022-07-28 DIAGNOSIS — Z5181 Encounter for therapeutic drug level monitoring: Secondary | ICD-10-CM | POA: Diagnosis not present

## 2022-07-28 DIAGNOSIS — M1711 Unilateral primary osteoarthritis, right knee: Secondary | ICD-10-CM | POA: Diagnosis not present

## 2022-07-28 DIAGNOSIS — A419 Sepsis, unspecified organism: Secondary | ICD-10-CM | POA: Diagnosis not present

## 2022-07-28 DIAGNOSIS — M7062 Trochanteric bursitis, left hip: Secondary | ICD-10-CM | POA: Insufficient documentation

## 2022-07-28 DIAGNOSIS — Z79891 Long term (current) use of opiate analgesic: Secondary | ICD-10-CM | POA: Diagnosis not present

## 2022-07-28 DIAGNOSIS — M25511 Pain in right shoulder: Secondary | ICD-10-CM | POA: Insufficient documentation

## 2022-07-28 DIAGNOSIS — G894 Chronic pain syndrome: Secondary | ICD-10-CM

## 2022-07-28 DIAGNOSIS — E1140 Type 2 diabetes mellitus with diabetic neuropathy, unspecified: Secondary | ICD-10-CM | POA: Diagnosis not present

## 2022-07-28 DIAGNOSIS — J449 Chronic obstructive pulmonary disease, unspecified: Secondary | ICD-10-CM | POA: Diagnosis not present

## 2022-07-28 MED ORDER — OXYCODONE-ACETAMINOPHEN 10-325 MG PO TABS: 1.0000 | ORAL_TABLET | Freq: Four times a day (QID) | ORAL | 0 refills | Status: DC | PRN

## 2022-07-28 NOTE — Telephone Encounter (Signed)
Oral swab drug screen was consistent for prescribed medications.  ?

## 2022-07-28 NOTE — Progress Notes (Signed)
Subjective:    Patient ID: [name], female    DOB: December 25, 1950, 72 y.o.   MRN: [id number]  HPI: [name] is a 72 y.o. female who returns for follow up appointment for chronic pain and medication refill. She states her  pain is located in her neck radiating into her bilateral shoulders, mid- lower back pain radiating into her bilateral hips and bilateral lower extremities. She also reports bilateral feet pain with tingling and burning. She rates her pain 3. Her current exercise regime is walking short distances  and performing stretching exercises.  [name] has a diagnosis of Essential Hypertension, she states she is compliant with her anti-hypertensive medication. Blood Pressure was re-checked. She will keep a blood pressure log and F/U with her PCP.   [name] was admitted to [facility] on 07/10/2022 and discharged on 07/14/2022. She was admitted with Sepsis secondary to Left Pyelonephritis. Discharged Summary was reviewed.   [name] Morphine equivalent is 60.00 MME.   Last Oral Swab was Performed on 07/03/2022, it was consistent.    Pain Inventory Average Pain 5 Pain Right Now 3 My pain is intermittent, constant, sharp, burning, dull, stabbing, tingling, and aching  In the last 24 hours, has pain interfered with the following? General activity 1 Relation with others 0 Enjoyment of life 1 What TIME of day is your pain at its worst? morning  and night Sleep (in general) Fair  Pain is worse with: walking, bending, standing, and some activites Pain improves with: rest, heat/ice, and medication Relief from Meds:  fair  Family History  Problem Relation Age of Onset   Heart attack Mother    Colon cancer Mother    Heart attack Father    Stroke Sister    Hemophilia Child    Cancer Other    Coronary artery disease Other    Cancer Brother    Cancer Maternal Aunt    Cancer Maternal Uncle    Cancer Paternal Aunt    Cancer Paternal Uncle    Cancer Brother     Social History   Socioeconomic History   Marital status: Divorced    Spouse name: Not on file   Number of children: 4   Years of education: Not on file   Highest education level: Not on file  Occupational History   Occupation: Disabled  Tobacco Use   Smoking status: Never   Smokeless tobacco: Never  Vaping Use   Vaping Use: Never used  Substance and Sexual Activity   Alcohol use: No    Alcohol/week: 0.0 standard drinks of alcohol   Drug use: No   Sexual activity: Not on file  Other Topics Concern   Not on file  Social History Narrative   Patient does not get regular exercise   Denies caffeine use    Social Determinants of Health   Financial Resource Strain: Low Risk  (07/21/2022)   Overall Financial Resource Strain (CARDIA)    Difficulty of Paying Living Expenses: Not very hard  Food Insecurity: No Food Insecurity (07/21/2022)   Hunger Vital Sign    Worried About Running Out of Food in the Last Year: Never true    Ran Out of Food in the Last Year: Never true  Transportation Needs: No Transportation Needs (07/21/2022)   PRAPARE - Administrator, Civil Service (Medical): No    Lack of Transportation (Non-Medical): No  Physical Activity: Inactive (07/21/2022)   Exercise Vital Sign    Days  of Exercise per Week: 0 days    Minutes of Exercise per Session: 0 min  Stress: Not on file  Social Connections: Not on file   Past Surgical History:  Procedure Laterality Date   ABDOMINAL HYSTERECTOMY     Arm Surgery Bilateral    due to fall-pt broke both forearms   BIOPSY N/A 07/01/2013   Procedure: BIOPSY;  Surgeon: Malissa Hippo, MD;  [facility]: [facility];  Service: Endoscopy;  Laterality: N/A;   CHOLECYSTECTOMY     COLONOSCOPY  08/06/2011   Procedure: COLONOSCOPY;  Surgeon: Malissa Hippo, MD;  [facility];  Service: Endoscopy;  Laterality: N/A;  215   COLONOSCOPY WITH PROPOFOL N/A 12/31/2012   Procedure: COLONOSCOPY WITH PROPOFOL;  Surgeon: Malissa Hippo,  MD;  Location: [facility];  Service: Endoscopy;  Laterality: N/A;  in cecum at 0814, total withdrawal time   COLONOSCOPY WITH PROPOFOL N/A 02/05/2018   Procedure: COLONOSCOPY WITH PROPOFOL;  Surgeon: Malissa Hippo, MD;  [facility];  Service: Endoscopy;  Laterality: N/A;  1:25   CORONARY ANGIOPLASTY WITH STENT PLACEMENT     CYSTOSCOPY W/ URETERAL STENT PLACEMENT Right 11/10/2019   at [facility]   CYSTOSCOPY WITH RETROGRADE PYELOGRAM, URETEROSCOPY AND STENT PLACEMENT Right 12/22/2019   Procedure: CYSTOSCOPY WITH RIGHT URETERAL STENT REMOVAL; RIGHT RETROGRADE PYELOGRAM,RIGHT URETEROSCOPY;  Surgeon: Malen Gauze, MD;  Location: [facility];  Service: Urology;  Laterality: Right;   ESOPHAGEAL DILATION N/A 06/07/2015   Procedure: ESOPHAGEAL DILATION;  Surgeon: Malissa Hippo, MD;  [facility];  Service: Endoscopy;  Laterality: N/A;   ESOPHAGOGASTRODUODENOSCOPY N/A 06/07/2015   Procedure: ESOPHAGOGASTRODUODENOSCOPY (EGD);  Surgeon: Malissa Hippo, MD;  [facility];  Service: Endoscopy;  Laterality: N/A;  2:45 - moved to 1:00 - Ann to notify pt   ESOPHAGOGASTRODUODENOSCOPY (EGD) WITH PROPOFOL N/A 12/31/2012   Procedure: ESOPHAGOGASTRODUODENOSCOPY (EGD) WITH PROPOFOL;  Surgeon: Malissa Hippo, MD;  [facility];  Service: Endoscopy;  Laterality: N/A;  GE junction 37,   ESOPHAGOGASTRODUODENOSCOPY (EGD) WITH PROPOFOL N/A 07/01/2013   Procedure: ESOPHAGOGASTRODUODENOSCOPY (EGD) WITH PROPOFOL;  Surgeon: Malissa Hippo, MD;  [facility];  Service: Endoscopy;  Laterality: N/A;  950   MALONEY DILATION N/A 12/31/2012   Procedure: MALONEY DILATION;  Surgeon: Malissa Hippo, MD;  [facility]: [facility];  Service: Endoscopy;  Laterality: N/A;  used a # 54,#56   MALONEY DILATION N/A 07/01/2013   Procedure: MALONEY DILATION;  Surgeon: Malissa Hippo, MD;  [facility]: [facility];  Service: Endoscopy;  Laterality: N/A;  54/58; no heme present   POLYPECTOMY  02/05/2018   Procedure:  POLYPECTOMY;  Surgeon: Malissa Hippo, MD;  [facility];  Service: Endoscopy;;  distal rectum (HS)   STONE EXTRACTION WITH BASKET Right 12/22/2019   Procedure: STONE EXTRACTION WITH BASKET;  Surgeon: Malen Gauze, MD;  [facility]: [facility];  Service: Urology;  Laterality: Right;   Past Surgical History:  Procedure Laterality Date   ABDOMINAL HYSTERECTOMY     Arm Surgery Bilateral    due to fall-pt broke both forearms   BIOPSY N/A 07/01/2013   Procedure: BIOPSY;  Surgeon: Malissa Hippo, MD;  [facility];  Service: Endoscopy;  Laterality: N/A;   CHOLECYSTECTOMY     COLONOSCOPY  08/06/2011   Procedure: COLONOSCOPY;  Surgeon: Malissa Hippo, MD;  [facility];  Service: Endoscopy;  Laterality: N/A;  215   COLONOSCOPY WITH PROPOFOL N/A 12/31/2012  Procedure: COLONOSCOPY WITH PROPOFOL;  Surgeon: Malissa Hippo, MD;  [facility];  Service: Endoscopy;  Laterality: N/A;  in cecum at 0814, total withdrawal time   COLONOSCOPY WITH PROPOFOL N/A 02/05/2018   Procedure: COLONOSCOPY WITH PROPOFOL;  Surgeon: Malissa Hippo, MD;  [facility];  Service: Endoscopy;  Laterality: N/A;  1:25   CORONARY ANGIOPLASTY WITH STENT PLACEMENT     CYSTOSCOPY W/ URETERAL STENT PLACEMENT Right 11/10/2019   at [facility]   CYSTOSCOPY WITH RETROGRADE PYELOGRAM, URETEROSCOPY AND STENT PLACEMENT Right 12/22/2019   Procedure: CYSTOSCOPY WITH RIGHT URETERAL STENT REMOVAL; RIGHT RETROGRADE PYELOGRAM,RIGHT URETEROSCOPY;  Surgeon: Malen Gauze, MD;  [facility];  Service: Urology;  Laterality: Right;   ESOPHAGEAL DILATION N/A 06/07/2015   Procedure: ESOPHAGEAL DILATION;  Surgeon: Malissa Hippo, MD;  [facility];  Service: Endoscopy;  Laterality: N/A;   ESOPHAGOGASTRODUODENOSCOPY N/A 06/07/2015   Procedure: ESOPHAGOGASTRODUODENOSCOPY (EGD);  Surgeon: Malissa Hippo, MD;  [facility];  Service: Endoscopy;  Laterality: N/A;  2:45 - moved to 1:00  - Ann to notify pt   ESOPHAGOGASTRODUODENOSCOPY (EGD) WITH PROPOFOL N/A 12/31/2012   Procedure: ESOPHAGOGASTRODUODENOSCOPY (EGD) WITH PROPOFOL;  Surgeon: Malissa Hippo, MD;  [facility];  Service: Endoscopy;  Laterality: N/A;  GE junction 37,   ESOPHAGOGASTRODUODENOSCOPY (EGD) WITH PROPOFOL N/A 07/01/2013   Procedure: ESOPHAGOGASTRODUODENOSCOPY (EGD) WITH PROPOFOL;  Surgeon: Malissa Hippo, MD;  [facility];  Service: Endoscopy;  Laterality: N/A;  950   MALONEY DILATION N/A 12/31/2012   Procedure: MALONEY DILATION;  Surgeon: Malissa Hippo, MD;  [facility]: [facility];  Service: Endoscopy;  Laterality: N/A;  used a # 54,#56   MALONEY DILATION N/A 07/01/2013   Procedure: MALONEY DILATION;  Surgeon: Malissa Hippo, MD;  [facility]: [facility];  Service: Endoscopy;  Laterality: N/A;  54/58; no heme present   POLYPECTOMY  02/05/2018   Procedure: POLYPECTOMY;  Surgeon: Malissa Hippo, MD;  [facility];  Service: Endoscopy;;  distal rectum (HS)   STONE EXTRACTION WITH BASKET Right 12/22/2019   Procedure: STONE EXTRACTION WITH BASKET;  Surgeon: Malen Gauze, MD;  Location: [facility];  Service: Urology;  Laterality: Right;   Past Medical History:  Diagnosis Date   Anxiety and depression    Arthritis    CAD (coronary artery disease)    Stent placement circumflex coronary 2007, catheterization 2008 patent stents. 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    High cholesterol    Hx of blood clots    Hypertension    MI (myocardial infarction) (HCC)    2007   Obstructive sleep apnea    CPAP, setting ?2   Palpitations     Sleep apnea    Tachycardia    BP (!) 153/83   Pulse 73   Ht 5\' 4"  (1.626 m)   Wt 252 lb (114.3 kg)   SpO2 94%   BMI 43.26 kg/m   Opioid Risk Score:   Fall Risk Score:  `1  Depression screen PHQ 2/9     07/28/2022   11:51 AM 10/17/2021    1:56 PM 08/09/2021    1:00 PM 05/16/2021    1:15 PM 10/11/2020    1:04 PM 01/24/2020    1:19 PM 11/18/2019   10:36 AM  Depression screen PHQ 2/9  Decreased Interest 1 3 1  0 1 0 1  Down, Depressed, Hopeless 1 3 1 3   0 1  PHQ - 2 Score 2 6 2 3 1  0 2  Altered sleeping  0       Tired, decreased energy  1       Change in appetite  0       Feeling bad or failure about yourself   0       Trouble concentrating  1       Moving slowly or fidgety/restless  0       Suicidal thoughts  0       PHQ-9 Score  8       Difficult doing work/chores  Not difficult at all         Review of Systems  Musculoskeletal:  Positive for back pain and neck pain.       Pain in both shoulders       Objective:   Physical Exam Vitals and nursing note reviewed.  Constitutional:      Appearance: Normal appearance.  Neck:     Comments: Cervical Paraspinal Tenderness: C-5-C-6  Cardiovascular:     Rate and Rhythm: Normal rate and regular rhythm.     Pulses: Normal pulses.     Heart sounds: Normal heart sounds.  Pulmonary:     Effort: Pulmonary effort is normal.     Breath sounds: Normal breath sounds.  Musculoskeletal:     Cervical back: Normal range of motion and neck supple.     Comments: Normal Muscle Bulk and Muscle Testing Reveals:  Upper Extremities: Full ROM and Muscle Strength 5/5 Bilateral AC Joint Tenderness  Thoracic and Lumbar Hypersensitivity Lower Extremities: Full ROM and Muscle Strength 5/5 Arises from Table slowly using cane for support Antalgic  Gait     Skin:    General: Skin is warm and dry.  Neurological:     Mental Status: She is alert and oriented to person, place, and time.  Psychiatric:        Mood and Affect: Mood normal.         Behavior: Behavior normal.           Assessment & Plan:  1.Lumbar pain lumbar spondylosis:  Encouraged to continue to increase Activity as tolerated. 07/28/2022 Continue current medication regimen. Refilled Oxycodone 10/325 mg one tablet every 6 hours as needed for pain. #120.  We will continue the opioid monitoring program, this consists of regular clinic visits, examinations, urine drug screen, pill counts as well as use of [place] Controlled Substance Reporting system. A 12 month History has been reviewed on the [place] Controlled Substance Reporting System on 07/28/2022.  2. Lumbar Radiculitis: Continue current medication regimen with Gabapentin. 07/28/2022 3. Bilateral Knee Pain/ Degenerative: Continue Voltaren Gel. Continue to Monitor. 07/28/2022 4. Severe diabetic poly neuropathy: Continue current medication regimen with Gabapentin. 07/28/2022 5. Insomnia: No complaints today. Continue to Monitor.  07/28/2022 6.Anxiety/Depression: Continue Current Medication regimen as prescribed by PCP and   Psychiatry. 07/28/2022 7. De Quervains Tenosynovitis: No Complaints Today. 07/28/2022 8. Bilateral  Greater Trochanteric Bursitis: Continue with Ice and Heat Therapy. Continue to Monitor. 07/28/2022.  9. Cervicalgia/ Cervical Radiculitis:Continue Gabapentin. Continue with HEP as Tolerated and continue to monitor. 07/28/2022 10. Chronic Bilateral Thoracic Back Pain: Continue HEP as Tolerated. Continue current medication regimen. Continue to monitor. 07/28/2022.     F/U in 1 month

## 2022-07-29 DIAGNOSIS — A419 Sepsis, unspecified organism: Secondary | ICD-10-CM | POA: Diagnosis not present

## 2022-07-29 DIAGNOSIS — Z794 Long term (current) use of insulin: Secondary | ICD-10-CM | POA: Diagnosis not present

## 2022-07-29 DIAGNOSIS — F32A Depression, unspecified: Secondary | ICD-10-CM | POA: Diagnosis not present

## 2022-07-29 DIAGNOSIS — I69320 Aphasia following cerebral infarction: Secondary | ICD-10-CM | POA: Diagnosis not present

## 2022-07-29 DIAGNOSIS — Z7982 Long term (current) use of aspirin: Secondary | ICD-10-CM | POA: Diagnosis not present

## 2022-07-29 DIAGNOSIS — E7800 Pure hypercholesterolemia, unspecified: Secondary | ICD-10-CM | POA: Diagnosis not present

## 2022-07-29 DIAGNOSIS — N12 Tubulo-interstitial nephritis, not specified as acute or chronic: Secondary | ICD-10-CM | POA: Diagnosis not present

## 2022-07-29 DIAGNOSIS — M1990 Unspecified osteoarthritis, unspecified site: Secondary | ICD-10-CM | POA: Diagnosis not present

## 2022-07-29 DIAGNOSIS — I252 Old myocardial infarction: Secondary | ICD-10-CM | POA: Diagnosis not present

## 2022-07-29 DIAGNOSIS — I110 Hypertensive heart disease with heart failure: Secondary | ICD-10-CM | POA: Diagnosis not present

## 2022-07-29 DIAGNOSIS — E1140 Type 2 diabetes mellitus with diabetic neuropathy, unspecified: Secondary | ICD-10-CM | POA: Diagnosis not present

## 2022-07-29 DIAGNOSIS — I69354 Hemiplegia and hemiparesis following cerebral infarction affecting left non-dominant side: Secondary | ICD-10-CM | POA: Diagnosis not present

## 2022-07-29 DIAGNOSIS — K219 Gastro-esophageal reflux disease without esophagitis: Secondary | ICD-10-CM | POA: Diagnosis not present

## 2022-07-29 DIAGNOSIS — D649 Anemia, unspecified: Secondary | ICD-10-CM | POA: Diagnosis not present

## 2022-07-29 DIAGNOSIS — I2510 Atherosclerotic heart disease of native coronary artery without angina pectoris: Secondary | ICD-10-CM | POA: Diagnosis not present

## 2022-07-29 DIAGNOSIS — J449 Chronic obstructive pulmonary disease, unspecified: Secondary | ICD-10-CM | POA: Diagnosis not present

## 2022-07-29 DIAGNOSIS — Z7902 Long term (current) use of antithrombotics/antiplatelets: Secondary | ICD-10-CM | POA: Diagnosis not present

## 2022-07-29 DIAGNOSIS — G4733 Obstructive sleep apnea (adult) (pediatric): Secondary | ICD-10-CM | POA: Diagnosis not present

## 2022-07-29 DIAGNOSIS — E1165 Type 2 diabetes mellitus with hyperglycemia: Secondary | ICD-10-CM | POA: Diagnosis not present

## 2022-07-29 DIAGNOSIS — I5032 Chronic diastolic (congestive) heart failure: Secondary | ICD-10-CM | POA: Diagnosis not present

## 2022-07-29 DIAGNOSIS — E559 Vitamin D deficiency, unspecified: Secondary | ICD-10-CM | POA: Diagnosis not present

## 2022-07-29 DIAGNOSIS — Z7984 Long term (current) use of oral hypoglycemic drugs: Secondary | ICD-10-CM | POA: Diagnosis not present

## 2022-08-01 ENCOUNTER — Ambulatory Visit: Payer: Self-pay | Admitting: Nurse Practitioner

## 2022-08-04 DIAGNOSIS — D649 Anemia, unspecified: Secondary | ICD-10-CM | POA: Diagnosis not present

## 2022-08-04 DIAGNOSIS — I110 Hypertensive heart disease with heart failure: Secondary | ICD-10-CM | POA: Diagnosis not present

## 2022-08-04 DIAGNOSIS — E559 Vitamin D deficiency, unspecified: Secondary | ICD-10-CM | POA: Diagnosis not present

## 2022-08-04 DIAGNOSIS — I2510 Atherosclerotic heart disease of native coronary artery without angina pectoris: Secondary | ICD-10-CM | POA: Diagnosis not present

## 2022-08-04 DIAGNOSIS — E1165 Type 2 diabetes mellitus with hyperglycemia: Secondary | ICD-10-CM | POA: Diagnosis not present

## 2022-08-04 DIAGNOSIS — Z7982 Long term (current) use of aspirin: Secondary | ICD-10-CM | POA: Diagnosis not present

## 2022-08-04 DIAGNOSIS — J449 Chronic obstructive pulmonary disease, unspecified: Secondary | ICD-10-CM | POA: Diagnosis not present

## 2022-08-04 DIAGNOSIS — I5032 Chronic diastolic (congestive) heart failure: Secondary | ICD-10-CM | POA: Diagnosis not present

## 2022-08-04 DIAGNOSIS — I69320 Aphasia following cerebral infarction: Secondary | ICD-10-CM | POA: Diagnosis not present

## 2022-08-04 DIAGNOSIS — A419 Sepsis, unspecified organism: Secondary | ICD-10-CM | POA: Diagnosis not present

## 2022-08-04 DIAGNOSIS — Z7984 Long term (current) use of oral hypoglycemic drugs: Secondary | ICD-10-CM | POA: Diagnosis not present

## 2022-08-04 DIAGNOSIS — G4733 Obstructive sleep apnea (adult) (pediatric): Secondary | ICD-10-CM | POA: Diagnosis not present

## 2022-08-04 DIAGNOSIS — I252 Old myocardial infarction: Secondary | ICD-10-CM | POA: Diagnosis not present

## 2022-08-04 DIAGNOSIS — E7800 Pure hypercholesterolemia, unspecified: Secondary | ICD-10-CM | POA: Diagnosis not present

## 2022-08-04 DIAGNOSIS — K219 Gastro-esophageal reflux disease without esophagitis: Secondary | ICD-10-CM | POA: Diagnosis not present

## 2022-08-04 DIAGNOSIS — I69354 Hemiplegia and hemiparesis following cerebral infarction affecting left non-dominant side: Secondary | ICD-10-CM | POA: Diagnosis not present

## 2022-08-04 DIAGNOSIS — Z7902 Long term (current) use of antithrombotics/antiplatelets: Secondary | ICD-10-CM | POA: Diagnosis not present

## 2022-08-04 DIAGNOSIS — E1140 Type 2 diabetes mellitus with diabetic neuropathy, unspecified: Secondary | ICD-10-CM | POA: Diagnosis not present

## 2022-08-04 DIAGNOSIS — F32A Depression, unspecified: Secondary | ICD-10-CM | POA: Diagnosis not present

## 2022-08-04 DIAGNOSIS — M1990 Unspecified osteoarthritis, unspecified site: Secondary | ICD-10-CM | POA: Diagnosis not present

## 2022-08-04 DIAGNOSIS — Z794 Long term (current) use of insulin: Secondary | ICD-10-CM | POA: Diagnosis not present

## 2022-08-04 DIAGNOSIS — N12 Tubulo-interstitial nephritis, not specified as acute or chronic: Secondary | ICD-10-CM | POA: Diagnosis not present

## 2022-08-05 ENCOUNTER — Ambulatory Visit: Payer: Self-pay | Admitting: *Deleted

## 2022-08-05 NOTE — Patient Outreach (Signed)
  Care Coordination   08/05/2022 Name: [name] MRN: [id number] DOB: 05/15/1950   Care Coordination Outreach Attempts:  An unsuccessful telephone outreach was attempted for a scheduled appointment today.  Follow Up Plan:  Additional outreach attempts will be made to offer the patient care coordination information and services.   Encounter Outcome:  No Answer   Care Coordination Interventions:  No, not indicated    Demetrios Loll, BSN, RN-BC RN Care Coordinator [facility]  [facility] Direct Dial: [phone] Main #: [phone]

## 2022-08-06 ENCOUNTER — Telehealth: Payer: Self-pay | Admitting: *Deleted

## 2022-08-06 DIAGNOSIS — I2510 Atherosclerotic heart disease of native coronary artery without angina pectoris: Secondary | ICD-10-CM | POA: Diagnosis not present

## 2022-08-06 DIAGNOSIS — I252 Old myocardial infarction: Secondary | ICD-10-CM | POA: Diagnosis not present

## 2022-08-06 DIAGNOSIS — Z794 Long term (current) use of insulin: Secondary | ICD-10-CM | POA: Diagnosis not present

## 2022-08-06 DIAGNOSIS — E1165 Type 2 diabetes mellitus with hyperglycemia: Secondary | ICD-10-CM | POA: Diagnosis not present

## 2022-08-06 DIAGNOSIS — R739 Hyperglycemia, unspecified: Secondary | ICD-10-CM | POA: Diagnosis not present

## 2022-08-06 DIAGNOSIS — I10 Essential (primary) hypertension: Secondary | ICD-10-CM | POA: Diagnosis not present

## 2022-08-06 DIAGNOSIS — D649 Anemia, unspecified: Secondary | ICD-10-CM | POA: Diagnosis not present

## 2022-08-06 DIAGNOSIS — M549 Dorsalgia, unspecified: Secondary | ICD-10-CM | POA: Diagnosis not present

## 2022-08-06 DIAGNOSIS — N12 Tubulo-interstitial nephritis, not specified as acute or chronic: Secondary | ICD-10-CM | POA: Diagnosis not present

## 2022-08-06 DIAGNOSIS — I110 Hypertensive heart disease with heart failure: Secondary | ICD-10-CM | POA: Diagnosis not present

## 2022-08-06 DIAGNOSIS — E559 Vitamin D deficiency, unspecified: Secondary | ICD-10-CM | POA: Diagnosis not present

## 2022-08-06 DIAGNOSIS — I69320 Aphasia following cerebral infarction: Secondary | ICD-10-CM | POA: Diagnosis not present

## 2022-08-06 DIAGNOSIS — Z7982 Long term (current) use of aspirin: Secondary | ICD-10-CM | POA: Diagnosis not present

## 2022-08-06 DIAGNOSIS — Z7951 Long term (current) use of inhaled steroids: Secondary | ICD-10-CM | POA: Diagnosis not present

## 2022-08-06 DIAGNOSIS — E1140 Type 2 diabetes mellitus with diabetic neuropathy, unspecified: Secondary | ICD-10-CM | POA: Diagnosis not present

## 2022-08-06 DIAGNOSIS — M1990 Unspecified osteoarthritis, unspecified site: Secondary | ICD-10-CM | POA: Diagnosis not present

## 2022-08-06 DIAGNOSIS — Z66 Do not resuscitate: Secondary | ICD-10-CM | POA: Diagnosis not present

## 2022-08-06 DIAGNOSIS — R600 Localized edema: Secondary | ICD-10-CM | POA: Diagnosis not present

## 2022-08-06 DIAGNOSIS — J449 Chronic obstructive pulmonary disease, unspecified: Secondary | ICD-10-CM | POA: Diagnosis not present

## 2022-08-06 DIAGNOSIS — G8929 Other chronic pain: Secondary | ICD-10-CM | POA: Diagnosis not present

## 2022-08-06 DIAGNOSIS — R000 Tachycardia, unspecified: Secondary | ICD-10-CM | POA: Diagnosis not present

## 2022-08-06 DIAGNOSIS — I69354 Hemiplegia and hemiparesis following cerebral infarction affecting left non-dominant side: Secondary | ICD-10-CM | POA: Diagnosis not present

## 2022-08-06 DIAGNOSIS — F32A Depression, unspecified: Secondary | ICD-10-CM | POA: Diagnosis not present

## 2022-08-06 DIAGNOSIS — A419 Sepsis, unspecified organism: Secondary | ICD-10-CM | POA: Diagnosis not present

## 2022-08-06 DIAGNOSIS — Z79899 Other long term (current) drug therapy: Secondary | ICD-10-CM | POA: Diagnosis not present

## 2022-08-06 DIAGNOSIS — I5032 Chronic diastolic (congestive) heart failure: Secondary | ICD-10-CM | POA: Diagnosis not present

## 2022-08-06 DIAGNOSIS — Z888 Allergy status to other drugs, medicaments and biological substances status: Secondary | ICD-10-CM | POA: Diagnosis not present

## 2022-08-06 DIAGNOSIS — G4733 Obstructive sleep apnea (adult) (pediatric): Secondary | ICD-10-CM | POA: Diagnosis not present

## 2022-08-06 DIAGNOSIS — Z7984 Long term (current) use of oral hypoglycemic drugs: Secondary | ICD-10-CM | POA: Diagnosis not present

## 2022-08-06 DIAGNOSIS — K219 Gastro-esophageal reflux disease without esophagitis: Secondary | ICD-10-CM | POA: Diagnosis not present

## 2022-08-06 DIAGNOSIS — E119 Type 2 diabetes mellitus without complications: Secondary | ICD-10-CM | POA: Diagnosis not present

## 2022-08-06 DIAGNOSIS — R410 Disorientation, unspecified: Secondary | ICD-10-CM | POA: Diagnosis not present

## 2022-08-06 DIAGNOSIS — E7800 Pure hypercholesterolemia, unspecified: Secondary | ICD-10-CM | POA: Diagnosis not present

## 2022-08-06 DIAGNOSIS — Z7902 Long term (current) use of antithrombotics/antiplatelets: Secondary | ICD-10-CM | POA: Diagnosis not present

## 2022-08-06 NOTE — Telephone Encounter (Signed)
Oral swab drug screen was consistent for prescribed medications.  ?

## 2022-08-12 DIAGNOSIS — A419 Sepsis, unspecified organism: Secondary | ICD-10-CM | POA: Diagnosis not present

## 2022-08-12 DIAGNOSIS — F32A Depression, unspecified: Secondary | ICD-10-CM | POA: Diagnosis not present

## 2022-08-12 DIAGNOSIS — Z7984 Long term (current) use of oral hypoglycemic drugs: Secondary | ICD-10-CM | POA: Diagnosis not present

## 2022-08-12 DIAGNOSIS — I69354 Hemiplegia and hemiparesis following cerebral infarction affecting left non-dominant side: Secondary | ICD-10-CM | POA: Diagnosis not present

## 2022-08-12 DIAGNOSIS — Z7982 Long term (current) use of aspirin: Secondary | ICD-10-CM | POA: Diagnosis not present

## 2022-08-12 DIAGNOSIS — K219 Gastro-esophageal reflux disease without esophagitis: Secondary | ICD-10-CM | POA: Diagnosis not present

## 2022-08-12 DIAGNOSIS — I5032 Chronic diastolic (congestive) heart failure: Secondary | ICD-10-CM | POA: Diagnosis not present

## 2022-08-12 DIAGNOSIS — M1990 Unspecified osteoarthritis, unspecified site: Secondary | ICD-10-CM | POA: Diagnosis not present

## 2022-08-12 DIAGNOSIS — J449 Chronic obstructive pulmonary disease, unspecified: Secondary | ICD-10-CM | POA: Diagnosis not present

## 2022-08-12 DIAGNOSIS — I252 Old myocardial infarction: Secondary | ICD-10-CM | POA: Diagnosis not present

## 2022-08-12 DIAGNOSIS — E559 Vitamin D deficiency, unspecified: Secondary | ICD-10-CM | POA: Diagnosis not present

## 2022-08-12 DIAGNOSIS — E1165 Type 2 diabetes mellitus with hyperglycemia: Secondary | ICD-10-CM | POA: Diagnosis not present

## 2022-08-12 DIAGNOSIS — I110 Hypertensive heart disease with heart failure: Secondary | ICD-10-CM | POA: Diagnosis not present

## 2022-08-12 DIAGNOSIS — E1140 Type 2 diabetes mellitus with diabetic neuropathy, unspecified: Secondary | ICD-10-CM | POA: Diagnosis not present

## 2022-08-12 DIAGNOSIS — G4733 Obstructive sleep apnea (adult) (pediatric): Secondary | ICD-10-CM | POA: Diagnosis not present

## 2022-08-12 DIAGNOSIS — D649 Anemia, unspecified: Secondary | ICD-10-CM | POA: Diagnosis not present

## 2022-08-12 DIAGNOSIS — I69320 Aphasia following cerebral infarction: Secondary | ICD-10-CM | POA: Diagnosis not present

## 2022-08-12 DIAGNOSIS — I2510 Atherosclerotic heart disease of native coronary artery without angina pectoris: Secondary | ICD-10-CM | POA: Diagnosis not present

## 2022-08-12 DIAGNOSIS — E7800 Pure hypercholesterolemia, unspecified: Secondary | ICD-10-CM | POA: Diagnosis not present

## 2022-08-12 DIAGNOSIS — Z7902 Long term (current) use of antithrombotics/antiplatelets: Secondary | ICD-10-CM | POA: Diagnosis not present

## 2022-08-12 DIAGNOSIS — Z794 Long term (current) use of insulin: Secondary | ICD-10-CM | POA: Diagnosis not present

## 2022-08-12 DIAGNOSIS — N12 Tubulo-interstitial nephritis, not specified as acute or chronic: Secondary | ICD-10-CM | POA: Diagnosis not present

## 2022-08-13 DIAGNOSIS — E1165 Type 2 diabetes mellitus with hyperglycemia: Secondary | ICD-10-CM | POA: Diagnosis not present

## 2022-08-13 DIAGNOSIS — I5032 Chronic diastolic (congestive) heart failure: Secondary | ICD-10-CM | POA: Diagnosis not present

## 2022-08-13 DIAGNOSIS — E1140 Type 2 diabetes mellitus with diabetic neuropathy, unspecified: Secondary | ICD-10-CM | POA: Diagnosis not present

## 2022-08-13 DIAGNOSIS — G4733 Obstructive sleep apnea (adult) (pediatric): Secondary | ICD-10-CM | POA: Diagnosis not present

## 2022-08-13 DIAGNOSIS — I2510 Atherosclerotic heart disease of native coronary artery without angina pectoris: Secondary | ICD-10-CM | POA: Diagnosis not present

## 2022-08-13 DIAGNOSIS — M1990 Unspecified osteoarthritis, unspecified site: Secondary | ICD-10-CM | POA: Diagnosis not present

## 2022-08-13 DIAGNOSIS — J449 Chronic obstructive pulmonary disease, unspecified: Secondary | ICD-10-CM | POA: Diagnosis not present

## 2022-08-13 DIAGNOSIS — I252 Old myocardial infarction: Secondary | ICD-10-CM | POA: Diagnosis not present

## 2022-08-13 DIAGNOSIS — E7800 Pure hypercholesterolemia, unspecified: Secondary | ICD-10-CM | POA: Diagnosis not present

## 2022-08-13 DIAGNOSIS — D649 Anemia, unspecified: Secondary | ICD-10-CM | POA: Diagnosis not present

## 2022-08-13 DIAGNOSIS — I69354 Hemiplegia and hemiparesis following cerebral infarction affecting left non-dominant side: Secondary | ICD-10-CM | POA: Diagnosis not present

## 2022-08-13 DIAGNOSIS — Z794 Long term (current) use of insulin: Secondary | ICD-10-CM | POA: Diagnosis not present

## 2022-08-13 DIAGNOSIS — I69320 Aphasia following cerebral infarction: Secondary | ICD-10-CM | POA: Diagnosis not present

## 2022-08-13 DIAGNOSIS — A419 Sepsis, unspecified organism: Secondary | ICD-10-CM | POA: Diagnosis not present

## 2022-08-13 DIAGNOSIS — I110 Hypertensive heart disease with heart failure: Secondary | ICD-10-CM | POA: Diagnosis not present

## 2022-08-13 DIAGNOSIS — Z7982 Long term (current) use of aspirin: Secondary | ICD-10-CM | POA: Diagnosis not present

## 2022-08-13 DIAGNOSIS — N12 Tubulo-interstitial nephritis, not specified as acute or chronic: Secondary | ICD-10-CM | POA: Diagnosis not present

## 2022-08-13 DIAGNOSIS — Z7902 Long term (current) use of antithrombotics/antiplatelets: Secondary | ICD-10-CM | POA: Diagnosis not present

## 2022-08-13 DIAGNOSIS — K219 Gastro-esophageal reflux disease without esophagitis: Secondary | ICD-10-CM | POA: Diagnosis not present

## 2022-08-13 DIAGNOSIS — F32A Depression, unspecified: Secondary | ICD-10-CM | POA: Diagnosis not present

## 2022-08-13 DIAGNOSIS — Z7984 Long term (current) use of oral hypoglycemic drugs: Secondary | ICD-10-CM | POA: Diagnosis not present

## 2022-08-13 DIAGNOSIS — E559 Vitamin D deficiency, unspecified: Secondary | ICD-10-CM | POA: Diagnosis not present

## 2022-08-19 DIAGNOSIS — J449 Chronic obstructive pulmonary disease, unspecified: Secondary | ICD-10-CM | POA: Diagnosis not present

## 2022-08-19 DIAGNOSIS — I69354 Hemiplegia and hemiparesis following cerebral infarction affecting left non-dominant side: Secondary | ICD-10-CM | POA: Diagnosis not present

## 2022-08-19 DIAGNOSIS — E1140 Type 2 diabetes mellitus with diabetic neuropathy, unspecified: Secondary | ICD-10-CM | POA: Diagnosis not present

## 2022-08-19 DIAGNOSIS — E7800 Pure hypercholesterolemia, unspecified: Secondary | ICD-10-CM | POA: Diagnosis not present

## 2022-08-19 DIAGNOSIS — G4733 Obstructive sleep apnea (adult) (pediatric): Secondary | ICD-10-CM | POA: Diagnosis not present

## 2022-08-19 DIAGNOSIS — Z7984 Long term (current) use of oral hypoglycemic drugs: Secondary | ICD-10-CM | POA: Diagnosis not present

## 2022-08-19 DIAGNOSIS — E1165 Type 2 diabetes mellitus with hyperglycemia: Secondary | ICD-10-CM | POA: Diagnosis not present

## 2022-08-19 DIAGNOSIS — Z7902 Long term (current) use of antithrombotics/antiplatelets: Secondary | ICD-10-CM | POA: Diagnosis not present

## 2022-08-19 DIAGNOSIS — I2510 Atherosclerotic heart disease of native coronary artery without angina pectoris: Secondary | ICD-10-CM | POA: Diagnosis not present

## 2022-08-19 DIAGNOSIS — M1990 Unspecified osteoarthritis, unspecified site: Secondary | ICD-10-CM | POA: Diagnosis not present

## 2022-08-19 DIAGNOSIS — D649 Anemia, unspecified: Secondary | ICD-10-CM | POA: Diagnosis not present

## 2022-08-19 DIAGNOSIS — I110 Hypertensive heart disease with heart failure: Secondary | ICD-10-CM | POA: Diagnosis not present

## 2022-08-19 DIAGNOSIS — I252 Old myocardial infarction: Secondary | ICD-10-CM | POA: Diagnosis not present

## 2022-08-19 DIAGNOSIS — I69320 Aphasia following cerebral infarction: Secondary | ICD-10-CM | POA: Diagnosis not present

## 2022-08-19 DIAGNOSIS — E559 Vitamin D deficiency, unspecified: Secondary | ICD-10-CM | POA: Diagnosis not present

## 2022-08-19 DIAGNOSIS — F32A Depression, unspecified: Secondary | ICD-10-CM | POA: Diagnosis not present

## 2022-08-19 DIAGNOSIS — K219 Gastro-esophageal reflux disease without esophagitis: Secondary | ICD-10-CM | POA: Diagnosis not present

## 2022-08-19 DIAGNOSIS — A419 Sepsis, unspecified organism: Secondary | ICD-10-CM | POA: Diagnosis not present

## 2022-08-19 DIAGNOSIS — N12 Tubulo-interstitial nephritis, not specified as acute or chronic: Secondary | ICD-10-CM | POA: Diagnosis not present

## 2022-08-19 DIAGNOSIS — Z7982 Long term (current) use of aspirin: Secondary | ICD-10-CM | POA: Diagnosis not present

## 2022-08-19 DIAGNOSIS — Z794 Long term (current) use of insulin: Secondary | ICD-10-CM | POA: Diagnosis not present

## 2022-08-19 DIAGNOSIS — I5032 Chronic diastolic (congestive) heart failure: Secondary | ICD-10-CM | POA: Diagnosis not present

## 2022-08-20 ENCOUNTER — Encounter: Payer: Self-pay | Admitting: Nurse Practitioner

## 2022-08-20 ENCOUNTER — Ambulatory Visit: Payer: 59 | Attending: Nurse Practitioner | Admitting: Nurse Practitioner

## 2022-08-20 VITALS — BP 154/82 | HR 76 | Ht 64.0 in | Wt 250.2 lb

## 2022-08-20 DIAGNOSIS — E1165 Type 2 diabetes mellitus with hyperglycemia: Secondary | ICD-10-CM | POA: Diagnosis not present

## 2022-08-20 DIAGNOSIS — I2510 Atherosclerotic heart disease of native coronary artery without angina pectoris: Secondary | ICD-10-CM | POA: Diagnosis not present

## 2022-08-20 DIAGNOSIS — I10 Essential (primary) hypertension: Secondary | ICD-10-CM | POA: Diagnosis not present

## 2022-08-20 DIAGNOSIS — Z7984 Long term (current) use of oral hypoglycemic drugs: Secondary | ICD-10-CM

## 2022-08-20 DIAGNOSIS — I5032 Chronic diastolic (congestive) heart failure: Secondary | ICD-10-CM

## 2022-08-20 DIAGNOSIS — Z79899 Other long term (current) drug therapy: Secondary | ICD-10-CM

## 2022-08-20 MED ORDER — SEMAGLUTIDE(0.25 OR 0.5MG/DOS) 2 MG/3ML ~~LOC~~ SOPN: 0.2500 mg | PEN_INJECTOR | SUBCUTANEOUS | 1 refills | Status: DC

## 2022-08-20 MED ORDER — LISINOPRIL 20 MG PO TABS: 20.0000 mg | ORAL_TABLET | Freq: Every day | ORAL | 3 refills | Status: DC

## 2022-08-20 NOTE — Patient Instructions (Addendum)
Medication Instructions:  Your physician has recommended you make the following change in your medication:  Increase lisinopril to 20 mg daily Start ozempic 0.25 mg subcutaneously for 4 weeks, then increase to 0.5 mg weekly Continue other medications the same  Labwork: BMET in 1-2 weeks (08/27/2022) Non-fasting [facility] ([address] [place])  Testing/Procedures: none  Follow-Up: Your physician recommends that you schedule a follow-up appointment in: 6 months with Dr. Wyline Mood.  Any Other Special Instructions Will Be Listed Below (If Applicable). Your physician has requested that you regularly monitor and record your blood pressure readings at home. Please use the same machine at the same time of day to check your readings and record them to bring to your follow-up visit.   If you need a refill on your cardiac medications before your next appointment, please call your pharmacy.

## 2022-08-20 NOTE — Progress Notes (Addendum)
Office Visit    Patient Name: [name] Date of Encounter: 08/20/2022  PCP:  Ignatius Specking, MD   [facility]  Cardiologist:  Dina Rich, MD  Advanced Practice Provider:  No care team member to display Electrophysiologist:  None   Chief Complaint    [name] is a 72 y.o. female with a hx of CAD, chronic diastolic CHF, A-fib, hypertension, type 2 diabetes, diabetic neuropathy, history of palpitations, history of chest pain, COPD, and OSA, who presents today for scheduled follow-up.  Past Medical History    Past Medical History:  Diagnosis Date   Anxiety and depression    Arthritis    CAD (coronary artery disease)    Stent placement circumflex coronary 2007, catheterization 2008 patent stents. 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    High cholesterol    Hx of blood clots    Hypertension    MI (myocardial infarction) (HCC)    2007   Obstructive sleep apnea    CPAP, setting ?2   Palpitations    Sleep apnea    Tachycardia    Past Surgical History:  Procedure Laterality Date   ABDOMINAL HYSTERECTOMY     Arm Surgery Bilateral    due to fall-pt broke both forearms   BIOPSY N/A 07/01/2013   Procedure: BIOPSY;  Surgeon: Malissa Hippo, MD;  Location: [facility];  Service: Endoscopy;  Laterality: N/A;   CHOLECYSTECTOMY     COLONOSCOPY  08/06/2011   Procedure: COLONOSCOPY;  Surgeon: Malissa Hippo, MD;  Location: [facility];  Service: Endoscopy;  Laterality: N/A;  215   COLONOSCOPY WITH PROPOFOL N/A 12/31/2012   Procedure: COLONOSCOPY WITH PROPOFOL;  Surgeon: Malissa Hippo,  MD;  Location: [facility];  Service: Endoscopy;  Laterality: N/A;  in cecum at 0814, total withdrawal time   COLONOSCOPY WITH PROPOFOL N/A 02/05/2018   Procedure: COLONOSCOPY WITH PROPOFOL;  Surgeon: Malissa Hippo, MD;  [facility]: [facility];  Service: Endoscopy;  Laterality: N/A;  1:25   CORONARY ANGIOPLASTY WITH STENT PLACEMENT     CYSTOSCOPY W/ URETERAL STENT PLACEMENT Right 11/10/2019   at [facility]   CYSTOSCOPY WITH RETROGRADE PYELOGRAM, URETEROSCOPY AND STENT PLACEMENT Right 12/22/2019   Procedure: CYSTOSCOPY WITH RIGHT URETERAL STENT REMOVAL; RIGHT RETROGRADE PYELOGRAM,RIGHT URETEROSCOPY;  Surgeon: Malen Gauze, MD;  Location: [facility];  Service: Urology;  Laterality: Right;   ESOPHAGEAL DILATION N/A 06/07/2015   Procedure: ESOPHAGEAL DILATION;  Surgeon: Malissa Hippo, MD;  [facility];  Service: Endoscopy;  Laterality: N/A;   ESOPHAGOGASTRODUODENOSCOPY N/A 06/07/2015   Procedure: ESOPHAGOGASTRODUODENOSCOPY (EGD);  Surgeon: Malissa Hippo, MD;  [facility];  Service: Endoscopy;  Laterality: N/A;  2:45 - moved to 1:00 - Ann to notify pt   ESOPHAGOGASTRODUODENOSCOPY (EGD) WITH PROPOFOL N/A 12/31/2012   Procedure: ESOPHAGOGASTRODUODENOSCOPY (EGD) WITH PROPOFOL;  Surgeon: Malissa Hippo, MD;  Location: [facility];  Service: Endoscopy;  Laterality: N/A;  GE junction 37,   ESOPHAGOGASTRODUODENOSCOPY (EGD) WITH  PROPOFOL N/A 07/01/2013   Procedure: ESOPHAGOGASTRODUODENOSCOPY (EGD) WITH PROPOFOL;  Surgeon: Malissa Hippo, MD;  Location: [facility];  Service: Endoscopy;  Laterality: N/A;  950   MALONEY DILATION N/A 12/31/2012   Procedure: MALONEY DILATION;  Surgeon: Malissa Hippo, MD;  Location: [facility];  Service: Endoscopy;  Laterality: N/A;  used a # 54,#56   MALONEY DILATION N/A 07/01/2013   Procedure: MALONEY DILATION;  Surgeon: Malissa Hippo, MD;  Location: [facility];  Service: Endoscopy;  Laterality: N/A;  54/58; no heme present   POLYPECTOMY  02/05/2018   Procedure:  POLYPECTOMY;  Surgeon: Malissa Hippo, MD;  [facility];  Service: Endoscopy;;  distal rectum (HS)   STONE EXTRACTION WITH BASKET Right 12/22/2019   Procedure: STONE EXTRACTION WITH BASKET;  Surgeon: Malen Gauze, MD;  Location: [facility];  Service: Urology;  Laterality: Right;    Allergies  Allergies  Allergen Reactions   Amphetamine-Dextroamphetamine Swelling   Nitrofuran Derivatives Itching and Swelling   Amphetamine-Dextroamphet Er Swelling   Pregabalin Swelling   Topiramate Other (See Comments)    Tongue tingle   Verelan [Verapamil] Rash    History of Present Illness    [name] is a 72 y.o. female with a PMH as mentioned above.  Previous cardiovascular history includes MI in 2007, at that time received PCI to left circumflex.    Admitted to [facility] in 2021 due to UTI with sepsis, had some A-fib in that setting during admission, was started on Eliquis at the time, however she stopped taking this.  In 2022 a 30-day event monitor was negative for any arrhythmias.  NST in 2022 was negative for ischemia.  Echocardiogram around that time revealed EF 55 to 60%, no wall motion abnormalities, grade 1 DD.  Last seen by Dr. Dina Rich on July 11, 2021.  She noted chronic chest pains, she stated that pain could improve with antacid but also with nitroglycerin.  Imdur 50 mg daily was trialed.  She appeared volume up, Lasix increased to 60 mg daily.  In the interim, she has had multiple ED and hospital admissions since last office visit.  More recently, admitted due to TIA in February 2024, MRI of the brain was negative, was started on DAPT with aspirin and Plavix for 21 days, then instructed to stop Plavix and continue only aspirin indefinitely.  Was also treated for UTI.  Admitted to [facility] due to sepsis in April 2024, also presented with chest pain.  CT of abdomen was significant for left pyelonephritis, troponins were negative.  Was treated with antibiotics,  will follow-up with urology outpatient.  Recent ED visit at [facility] for altered mental status. CT of head negative, showed multifocal chronic infarcts with atrophy and chronic small vessel ischemic changes.  Her symptoms improved after receiving pain medication.   Today she presents for follow-up.  She states she is doing well. BP has been elevated at home. Denies any chest pain, shortness of breath, palpitations, syncope, presyncope, dizziness, orthopnea, PND, swelling or significant weight changes, acute bleeding, or claudication. Wanting to know about weight loss medication.   EKGs/Labs/Other Studies Reviewed:   The following studies were reviewed today:   EKG:  EKG is not ordered today.    Echo 04/2022:  1. Left ventricular ejection fraction, by estimation, is 60 to 65%. The  left ventricle has normal function. The left ventricle has no regional  wall motion abnormalities. There is moderate concentric left ventricular  hypertrophy. Left  ventricular  diastolic parameters are indeterminate.   2. Right ventricular systolic function is normal. The right ventricular  size is normal. Tricuspid regurgitation signal is inadequate for assessing  PA pressure.   3. The mitral valve is grossly normal. Trivial mitral valve  regurgitation.   4. The aortic valve is tricuspid. There is mild calcification of the  aortic valve. Aortic valve regurgitation is not visualized.   5. The inferior vena cava is normal in size with greater than 50%  respiratory variability, suggesting right atrial pressure of 3 mmHg.   Comparison(s): Prior images reviewed side by side. LVEF remains normal  range at 60-65%.  Myoview 08/2020: No diagnostic ST segment changes to indicate ischemia. Very small, mild intensity, apical anteroseptal defect that is reversible and consistent with either variable breast attenuation or small ischemic territory. This is an intermediate risk study based on calculated ejection  fraction, otherwise low risk in terms of perfusion imaging. Suggest echocardiogram for corroboration. Nuclear stress EF: 45%. There was no ST segment deviation noted during stress.  Recent Labs: 01/01/2022: TSH 3.590 07/10/2022: B Natriuretic Peptide 29.0 07/14/2022: ALT 13; BUN 10; Creatinine, Ser 0.72; Hemoglobin 11.0; Platelets 344; Potassium 3.1; Sodium 141  Recent Lipid Panel    Component Value Date/Time   CHOL 152 05/09/2022 0759   CHOL 128 01/01/2022 1357   TRIG 113 05/09/2022 0759   HDL 51 05/09/2022 0759   HDL 48 01/01/2022 1357   CHOLHDL 3.0 05/09/2022 0759   VLDL 23 05/09/2022 0759   LDLCALC 78 05/09/2022 0759   LDLCALC 46 01/01/2022 1357   LDLCALC 90 07/06/2018 0936    Risk Assessment/Calculations:   CHA2DS2-VASc Score = 5  This indicates a 7.2% annual risk of stroke. The patient's score is based upon: CHF History: 1 HTN History: 1 Diabetes History: 1 Stroke History: 0 Vascular Disease History: 0 Age Score: 1 Gender Score: 1   Home Medications   Current Meds  Medication Sig   acetaminophen (TYLENOL) 325 MG tablet Take 2 tablets (650 mg total) by mouth every 4 (four) hours as needed for mild pain (or temp > 37.5 C (99.5 F)).   ALPRAZolam (XANAX) 0.5 MG tablet Take 1 tablet (0.5 mg total) by mouth 2 (two) times daily as needed for anxiety or sleep.   aspirin EC 81 MG tablet Take 1 tablet (81 mg total) by mouth daily with breakfast. -Take Aspirin 81 mg daily along with Plavix 75 mg daily for 21 days then after that STOP the Plavix  and continue ONLY Aspirin 81 mg daily indefinitely--   atorvastatin (LIPITOR) 40 MG tablet Take 40 mg by mouth daily.   B-D ULTRAFINE III SHORT PEN 31G X 8 MM MISC Inject into the skin 4 (four) times daily.   Blood Glucose Monitoring Suppl (ACCU-CHEK GUIDE ME) w/Device KIT 1 Piece by Does not apply route as directed.   buPROPion ER (WELLBUTRIN SR) 100 MG 12 hr tablet Take 1 tablet (100 mg total) by mouth daily.   Cholecalciferol  (VITAMIN D3) 125 MCG (5000 UT) CAPS TAKE ONE CAPSULE BY MOUTH DAILY (Patient taking differently: Take 5,000 Units by mouth daily.)   diclofenac Sodium (VOLTAREN) 1 % GEL Apply topically.   doxepin (SINEQUAN) 25 MG capsule Take 75 mg by mouth at bedtime.    FEROSUL 325 (65 Fe) MG tablet Take 325 mg by mouth every morning.   furosemide (LASIX) 40 MG tablet TAKE 1 AND 1/2 TABLETS BY MOUTH DAILY   gabapentin (NEURONTIN) 300 MG capsule Take  1 capsule (300 mg total) by mouth 3 (three) times daily.   glipiZIDE (GLUCOTROL XL) 5 MG 24 hr tablet Take 5 mg by mouth daily.   Insulin Lispro Prot & Lispro (HUMALOG 75/25 MIX) (75-25) 100 UNIT/ML Kwikpen Inject 90 Units into the skin 2 (two) times daily with a meal.   isosorbide mononitrate (IMDUR) 30 MG 24 hr tablet TAKE 1/2 TABLET BY MOUTH DAILY (Need TO make APPOINTMENT FOR NEXT refills) (Patient taking differently: Take 30 mg by mouth daily. Take 1/2 tablet daily)   lidocaine (XYLOCAINE) 5 % ointment Apply topically 2 (two) times daily.   lisinopril (ZESTRIL) 20 MG tablet Take 1 tablet (20 mg total) by mouth daily.   meloxicam (MOBIC) 7.5 MG tablet Take 7.5 mg by mouth daily.   metFORMIN (GLUCOPHAGE) 1000 MG tablet Take 1 tablet (1,000 mg total) by mouth 2 (two) times daily with a meal.   methocarbamol (ROBAXIN) 500 MG tablet Take 500 mg by mouth 2 (two) times daily.   metoCLOPramide (REGLAN) 5 MG tablet Take 5 mg by mouth 3 (three) times daily as needed for nausea or vomiting.    metoprolol succinate (TOPROL-XL) 100 MG 24 hr tablet Take 100 mg by mouth daily. Take with or immediately following a meal.   montelukast (SINGULAIR) 10 MG tablet Take 10 mg by mouth at bedtime.    Multiple Vitamin (MULTIVITAMIN) capsule Take 1 capsule by mouth daily.   naloxone (NARCAN) nasal spray 4 mg/0.1 mL SMARTSIG:Both Nares   nitroGLYCERIN (NITROSTAT) 0.4 MG SL tablet Place 1 tablet (0.4 mg total) under the tongue every 5 (five) minutes as needed for chest pain (up to 3  doses. If taking 3rd dose call 911).   ondansetron (ZOFRAN) 4 MG tablet Take 4 mg by mouth every other day.   oxyCODONE-acetaminophen (PERCOCET) 10-325 MG tablet Take 1 tablet by mouth every 6 (six) hours as needed for pain.   pantoprazole (PROTONIX) 40 MG tablet TAKE 1 TABLET BY MOUTH TWICE DAILY BEFORE MEALS (Patient taking differently: Take 40 mg by mouth 2 (two) times daily before a meal.)   polyethylene glycol (MIRALAX / GLYCOLAX) 17 g packet Take 17 g by mouth daily as needed for moderate constipation.   potassium chloride (KLOR-CON) 10 MEQ tablet Take 10 mEq by mouth 2 (two) times daily.   PROAIR HFA 108 (90 Base) MCG/ACT inhaler Inhale 2 puffs into the lungs every 4 (four) hours as needed for wheezing or shortness of breath.    promethazine (PHENERGAN) 25 MG tablet Take 25 mg by mouth 4 (four) times daily as needed.   Semaglutide,0.25 or 0.5MG /DOS, 2 MG/3ML SOPN Inject 0.25 mg into the skin once a week. For 4 weeks, then increase to 0.5 mg weekly   senna-docusate (SENOKOT-S) 8.6-50 MG tablet Take 2 tablets by mouth at bedtime.   SURE COMFORT INS SYR 1CC/30G 30G X 5/16" 1 ML MISC 1 each by Other route daily.   lisinopril (ZESTRIL) 10 MG tablet Take 10 mg by mouth daily.     Review of Systems   All other systems reviewed and are otherwise negative except as noted above.  Physical Exam    VS:  BP (!) 154/82   Pulse 76   Ht 5\' 4"  (1.626 m)   Wt 250 lb 3.2 oz (113.5 kg)   SpO2 95%   BMI 42.95 kg/m  , BMI Body mass index is 42.95 kg/m.  Wt Readings from Last 3 Encounters:  08/20/22 250 lb 3.2 oz (113.5 kg)  07/28/22  252 lb (114.3 kg)  07/10/22 250 lb (113.4 kg)    Repeat BP: 162/78  GEN: Morbidly obese, 72 y.o. female in no acute distress. HEENT: normal. Neck: Supple, no JVD, carotid bruits, or masses. Cardiac: S1/S2, RRR, no murmurs, rubs, or gallops. No clubbing, cyanosis, edema.  Radials/PT 2+ and equal bilaterally.  Respiratory:  Respirations regular and unlabored, clear  to auscultation bilaterally. GI: Soft, nontender, nondistended. MS: No deformity or atrophy. Skin: Warm and dry, no rash. Neuro:  Strength and sensation are intact. Psych: Normal affect.  Assessment & Plan    CAD Stable with no anginal symptoms. No indication for ischemic evaluation. Continue Aspirin, atorvastatin, Imdur, Toprol XL, NTG PRN, and increasing lisinopril to 20 mg daily. Will obtain BMET in 1-2 weeks per protocol - see below. Heart healthy diet and regular cardiovascular exercise encouraged.   Chronic diastolic CHF Stage C, NYHA class I-II symptoms. Euvolemic and well compensated on exam. Continue Imdur, Toprol XL, furosemide and potassium supplement, and increasing lisinopril to 20 mg daily. Low sodium diet, fluid restriction <2L, and daily weights encouraged. Educated to contact our office for weight gain of 2 lbs overnight or 5 lbs in one week. Will obtain BMET per protocol.   HTN, medication management BP has been consistently elevated and not at goal. Discussed SBP goal < 130. Discussed to monitor BP at home at least 2 hours after medications and sitting for 5-10 minutes. Will increase lisinopril to 20 mg daily, will obtain BMET in 1-2 weeks. Continue imdur and Toprol XL. Given BP log. Heart healthy diet and regular cardiovascular exercise encouraged.   T2DM Last A1C 8.3. Blood sugars not at goal. Will initiate Ozempic, see blow. Continue rest of medication regimen. Diabetic, heart healthy diet and regular cardiovascular exercise encouraged. Continue to follow with PCP.  Morbid obesity BMI 42.95. Requesting medication to lose weight. Discussed Ozempic, and she is agreeable to proceed. Patient denied any personal or family history of medullary thyroid carcinoma or personal or family history of Multiple Endocrine Neoplasia syndrome type 2. Educated patient about medication. Will begin Ozempic. Will benefit from cardiac perspective as this will also help improve CBG and A1C  level. Weight loss via diet and exercise encouraged. Discussed the impact being overweight would have on cardiovascular risk.   Disposition: Follow up in 6 month(s) with Dina Rich, MD or [facility].  Signed, Sharlene Dory, NP 08/22/2022, 3:27 PM  [facility]

## 2022-08-25 DIAGNOSIS — Z7984 Long term (current) use of oral hypoglycemic drugs: Secondary | ICD-10-CM | POA: Diagnosis not present

## 2022-08-25 DIAGNOSIS — F32A Depression, unspecified: Secondary | ICD-10-CM | POA: Diagnosis not present

## 2022-08-25 DIAGNOSIS — J449 Chronic obstructive pulmonary disease, unspecified: Secondary | ICD-10-CM | POA: Diagnosis not present

## 2022-08-25 DIAGNOSIS — D649 Anemia, unspecified: Secondary | ICD-10-CM | POA: Diagnosis not present

## 2022-08-25 DIAGNOSIS — Z7982 Long term (current) use of aspirin: Secondary | ICD-10-CM | POA: Diagnosis not present

## 2022-08-25 DIAGNOSIS — N12 Tubulo-interstitial nephritis, not specified as acute or chronic: Secondary | ICD-10-CM | POA: Diagnosis not present

## 2022-08-25 DIAGNOSIS — I69354 Hemiplegia and hemiparesis following cerebral infarction affecting left non-dominant side: Secondary | ICD-10-CM | POA: Diagnosis not present

## 2022-08-25 DIAGNOSIS — E1165 Type 2 diabetes mellitus with hyperglycemia: Secondary | ICD-10-CM | POA: Diagnosis not present

## 2022-08-25 DIAGNOSIS — I2510 Atherosclerotic heart disease of native coronary artery without angina pectoris: Secondary | ICD-10-CM | POA: Diagnosis not present

## 2022-08-25 DIAGNOSIS — Z794 Long term (current) use of insulin: Secondary | ICD-10-CM | POA: Diagnosis not present

## 2022-08-25 DIAGNOSIS — E559 Vitamin D deficiency, unspecified: Secondary | ICD-10-CM | POA: Diagnosis not present

## 2022-08-25 DIAGNOSIS — E7800 Pure hypercholesterolemia, unspecified: Secondary | ICD-10-CM | POA: Diagnosis not present

## 2022-08-25 DIAGNOSIS — Z7902 Long term (current) use of antithrombotics/antiplatelets: Secondary | ICD-10-CM | POA: Diagnosis not present

## 2022-08-25 DIAGNOSIS — M1990 Unspecified osteoarthritis, unspecified site: Secondary | ICD-10-CM | POA: Diagnosis not present

## 2022-08-25 DIAGNOSIS — G4733 Obstructive sleep apnea (adult) (pediatric): Secondary | ICD-10-CM | POA: Diagnosis not present

## 2022-08-25 DIAGNOSIS — A419 Sepsis, unspecified organism: Secondary | ICD-10-CM | POA: Diagnosis not present

## 2022-08-25 DIAGNOSIS — K219 Gastro-esophageal reflux disease without esophagitis: Secondary | ICD-10-CM | POA: Diagnosis not present

## 2022-08-25 DIAGNOSIS — I5032 Chronic diastolic (congestive) heart failure: Secondary | ICD-10-CM | POA: Diagnosis not present

## 2022-08-25 DIAGNOSIS — E1140 Type 2 diabetes mellitus with diabetic neuropathy, unspecified: Secondary | ICD-10-CM | POA: Diagnosis not present

## 2022-08-25 DIAGNOSIS — I110 Hypertensive heart disease with heart failure: Secondary | ICD-10-CM | POA: Diagnosis not present

## 2022-08-25 DIAGNOSIS — I69320 Aphasia following cerebral infarction: Secondary | ICD-10-CM | POA: Diagnosis not present

## 2022-08-25 DIAGNOSIS — I252 Old myocardial infarction: Secondary | ICD-10-CM | POA: Diagnosis not present

## 2022-08-26 ENCOUNTER — Encounter: Payer: 59 | Attending: Registered Nurse | Admitting: Registered Nurse

## 2022-08-26 ENCOUNTER — Encounter: Payer: Self-pay | Admitting: Registered Nurse

## 2022-08-26 VITALS — BP 136/77 | HR 84 | Ht 64.0 in | Wt 245.0 lb

## 2022-08-26 DIAGNOSIS — M48062 Spinal stenosis, lumbar region with neurogenic claudication: Secondary | ICD-10-CM | POA: Diagnosis not present

## 2022-08-26 DIAGNOSIS — M5416 Radiculopathy, lumbar region: Secondary | ICD-10-CM | POA: Insufficient documentation

## 2022-08-26 DIAGNOSIS — M7062 Trochanteric bursitis, left hip: Secondary | ICD-10-CM | POA: Diagnosis not present

## 2022-08-26 DIAGNOSIS — G8929 Other chronic pain: Secondary | ICD-10-CM | POA: Insufficient documentation

## 2022-08-26 DIAGNOSIS — M546 Pain in thoracic spine: Secondary | ICD-10-CM | POA: Insufficient documentation

## 2022-08-26 DIAGNOSIS — Z5181 Encounter for therapeutic drug level monitoring: Secondary | ICD-10-CM | POA: Diagnosis not present

## 2022-08-26 DIAGNOSIS — M542 Cervicalgia: Secondary | ICD-10-CM | POA: Insufficient documentation

## 2022-08-26 DIAGNOSIS — M25512 Pain in left shoulder: Secondary | ICD-10-CM | POA: Insufficient documentation

## 2022-08-26 DIAGNOSIS — M7061 Trochanteric bursitis, right hip: Secondary | ICD-10-CM | POA: Diagnosis not present

## 2022-08-26 DIAGNOSIS — M1712 Unilateral primary osteoarthritis, left knee: Secondary | ICD-10-CM | POA: Diagnosis not present

## 2022-08-26 DIAGNOSIS — M25511 Pain in right shoulder: Secondary | ICD-10-CM | POA: Insufficient documentation

## 2022-08-26 DIAGNOSIS — Z79891 Long term (current) use of opiate analgesic: Secondary | ICD-10-CM | POA: Insufficient documentation

## 2022-08-26 DIAGNOSIS — M1711 Unilateral primary osteoarthritis, right knee: Secondary | ICD-10-CM | POA: Insufficient documentation

## 2022-08-26 DIAGNOSIS — G894 Chronic pain syndrome: Secondary | ICD-10-CM | POA: Diagnosis not present

## 2022-08-26 DIAGNOSIS — M5412 Radiculopathy, cervical region: Secondary | ICD-10-CM | POA: Diagnosis not present

## 2022-08-26 MED ORDER — OXYCODONE-ACETAMINOPHEN 10-325 MG PO TABS: 1.0000 | ORAL_TABLET | Freq: Four times a day (QID) | ORAL | 0 refills | Status: DC | PRN

## 2022-08-26 NOTE — Progress Notes (Unsigned)
Subjective:    Patient ID: [name], female    DOB: 02/18/1951, 72 y.o.   MRN: [id number]  HPI: [name] is a 72 y.o. female who returns for follow up appointment for chronic pain and medication refill. states *** pain is located in  ***. rates pain ***. current exercise regime is walking and performing stretching exercises.  [name] Morphine equivalent is 60.00 MME.   she is also prescribed *** by Dr. Marland Kitchen .We have discussed the black box warning of using opioids and benzodiazepines. I highlighted the dangers of using these drugs together and discussed the adverse events including respiratory suppression, overdose, cognitive impairment and importance of compliance with current regimen. We will continue to monitor and adjust as indicated.  she is being closely monitored and under the care of her psychiatrist________. she verbalizes understanding.      [name] last UDS was Performed on 07/03/2022, it was consistent.    Pain Inventory Average Pain 8 Pain Right Now 8 My pain is sharp, burning, stabbing, and tingling  In the last 24 hours, has pain interfered with the following? General activity 10 Relation with others 10 Enjoyment of life 10 What TIME of day is your pain at its worst? morning , evening, and night Sleep (in general) Fair  Pain is worse with: walking, bending, sitting, inactivity, and standing Pain improves with: medication Relief from Meds: 7  Family History  Problem Relation Age of Onset   Heart attack Mother    Colon cancer Mother    Heart attack Father    Stroke Sister    Hemophilia Child    Cancer Other    Coronary artery disease Other    Cancer Brother    Cancer Maternal Aunt    Cancer Maternal Uncle    Cancer Paternal Aunt    Cancer Paternal Uncle    Cancer Brother    Social History   Socioeconomic History   Marital status: Divorced    Spouse name: Not on file   Number of children: 4   Years of education: Not on file   Highest  education level: Not on file  Occupational History   Occupation: Disabled  Tobacco Use   Smoking status: Never   Smokeless tobacco: Never  Vaping Use   Vaping Use: Never used  Substance and Sexual Activity   Alcohol use: No    Alcohol/week: 0.0 standard drinks of alcohol   Drug use: No   Sexual activity: Not on file  Other Topics Concern   Not on file  Social History Narrative   Patient does not get regular exercise   Denies caffeine use    Social Determinants of Health   Financial Resource Strain: Low Risk  (07/21/2022)   Overall Financial Resource Strain (CARDIA)    Difficulty of Paying Living Expenses: Not very hard  Food Insecurity: No Food Insecurity (07/21/2022)   Hunger Vital Sign    Worried About Running Out of Food in the Last Year: Never true    Ran Out of Food in the Last Year: Never true  Transportation Needs: No Transportation Needs (07/21/2022)   PRAPARE - Administrator, Civil Service (Medical): No    Lack of Transportation (Non-Medical): No  Physical Activity: Inactive (07/21/2022)   Exercise Vital Sign    Days of Exercise per Week: 0 days    Minutes of Exercise per Session: 0 min  Stress: Not on file  Social Connections: Not on file  Past Surgical History:  Procedure Laterality Date   ABDOMINAL HYSTERECTOMY     Arm Surgery Bilateral    due to fall-pt broke both forearms   BIOPSY N/A 07/01/2013   Procedure: BIOPSY;  Surgeon: Malissa Hippo, MD;  Location: [facility];  Service: Endoscopy;  Laterality: N/A;   CHOLECYSTECTOMY     COLONOSCOPY  08/06/2011   Procedure: COLONOSCOPY;  Surgeon: Malissa Hippo, MD;  [facility];  Service: Endoscopy;  Laterality: N/A;  215   COLONOSCOPY WITH PROPOFOL N/A 12/31/2012   Procedure: COLONOSCOPY WITH PROPOFOL;  Surgeon: Malissa Hippo, MD;  Location: [facility];  Service: Endoscopy;  Laterality: N/A;  in cecum at 0814, total withdrawal time   COLONOSCOPY WITH PROPOFOL N/A 02/05/2018   Procedure:  COLONOSCOPY WITH PROPOFOL;  Surgeon: Malissa Hippo, MD;  [facility];  Service: Endoscopy;  Laterality: N/A;  1:25   CORONARY ANGIOPLASTY WITH STENT PLACEMENT     CYSTOSCOPY W/ URETERAL STENT PLACEMENT Right 11/10/2019   at [facility]   CYSTOSCOPY WITH RETROGRADE PYELOGRAM, URETEROSCOPY AND STENT PLACEMENT Right 12/22/2019   Procedure: CYSTOSCOPY WITH RIGHT URETERAL STENT REMOVAL; RIGHT RETROGRADE PYELOGRAM,RIGHT URETEROSCOPY;  Surgeon: Malen Gauze, MD;  Location: [facility];  Service: Urology;  Laterality: Right;   ESOPHAGEAL DILATION N/A 06/07/2015   Procedure: ESOPHAGEAL DILATION;  Surgeon: Malissa Hippo, MD;  [facility];  Service: Endoscopy;  Laterality: N/A;   ESOPHAGOGASTRODUODENOSCOPY N/A 06/07/2015   Procedure: ESOPHAGOGASTRODUODENOSCOPY (EGD);  Surgeon: Malissa Hippo, MD;  [facility];  Service: Endoscopy;  Laterality: N/A;  2:45 - moved to 1:00 - Ann to notify pt   ESOPHAGOGASTRODUODENOSCOPY (EGD) WITH PROPOFOL N/A 12/31/2012   Procedure: ESOPHAGOGASTRODUODENOSCOPY (EGD) WITH PROPOFOL;  Surgeon: Malissa Hippo, MD;  [facility]: [facility];  Service: Endoscopy;  Laterality: N/A;  GE junction 37,   ESOPHAGOGASTRODUODENOSCOPY (EGD) WITH PROPOFOL N/A 07/01/2013   Procedure: ESOPHAGOGASTRODUODENOSCOPY (EGD) WITH PROPOFOL;  Surgeon: Malissa Hippo, MD;  [facility]: [facility];  Service: Endoscopy;  Laterality: N/A;  950   MALONEY DILATION N/A 12/31/2012   Procedure: MALONEY DILATION;  Surgeon: Malissa Hippo, MD;  [facility]: [facility];  Service: Endoscopy;  Laterality: N/A;  used a # 54,#56   MALONEY DILATION N/A 07/01/2013   Procedure: MALONEY DILATION;  Surgeon: Malissa Hippo, MD;  [facility]: [facility];  Service: Endoscopy;  Laterality: N/A;  54/58; no heme present   POLYPECTOMY  02/05/2018   Procedure: POLYPECTOMY;  Surgeon: Malissa Hippo, MD;  [facility];  Service: Endoscopy;;  distal rectum (HS)   STONE EXTRACTION WITH BASKET Right 12/22/2019    Procedure: STONE EXTRACTION WITH BASKET;  Surgeon: Malen Gauze, MD;  [facility]: [facility];  Service: Urology;  Laterality: Right;   Past Surgical History:  Procedure Laterality Date   ABDOMINAL HYSTERECTOMY     Arm Surgery Bilateral    due to fall-pt broke both forearms   BIOPSY N/A 07/01/2013   Procedure: BIOPSY;  Surgeon: Malissa Hippo, MD;  [facility];  Service: Endoscopy;  Laterality: N/A;   CHOLECYSTECTOMY     COLONOSCOPY  08/06/2011   Procedure: COLONOSCOPY;  Surgeon: Malissa Hippo, MD;  [facility];  Service: Endoscopy;  Laterality: N/A;  215   COLONOSCOPY WITH PROPOFOL N/A 12/31/2012   Procedure: COLONOSCOPY WITH PROPOFOL;  Surgeon: Malissa Hippo, MD;  [facility]: [facility];  Service: Endoscopy;  Laterality: N/A;  in cecum at 0814, total withdrawal time    COLONOSCOPY WITH PROPOFOL N/A 02/05/2018   Procedure: COLONOSCOPY WITH PROPOFOL;  Surgeon: Malissa Hippo, MD;  [facility];  Service: Endoscopy;  Laterality: N/A;  1:25   CORONARY ANGIOPLASTY WITH STENT PLACEMENT     CYSTOSCOPY W/ URETERAL STENT PLACEMENT Right 11/10/2019   at [facility]   CYSTOSCOPY WITH RETROGRADE PYELOGRAM, URETEROSCOPY AND STENT PLACEMENT Right 12/22/2019   Procedure: CYSTOSCOPY WITH RIGHT URETERAL STENT REMOVAL; RIGHT RETROGRADE PYELOGRAM,RIGHT URETEROSCOPY;  Surgeon: Malen Gauze, MD;  [facility];  Service: Urology;  Laterality: Right;   ESOPHAGEAL DILATION N/A 06/07/2015   Procedure: ESOPHAGEAL DILATION;  Surgeon: Malissa Hippo, MD;  [facility];  Service: Endoscopy;  Laterality: N/A;   ESOPHAGOGASTRODUODENOSCOPY N/A 06/07/2015   Procedure: ESOPHAGOGASTRODUODENOSCOPY (EGD);  Surgeon: Malissa Hippo, MD;  [facility];  Service: Endoscopy;  Laterality: N/A;  2:45 - moved to 1:00 - Ann to notify pt   ESOPHAGOGASTRODUODENOSCOPY (EGD) WITH PROPOFOL N/A 12/31/2012   Procedure: ESOPHAGOGASTRODUODENOSCOPY (EGD) WITH PROPOFOL;  Surgeon: Malissa Hippo, MD;  [facility];  Service: Endoscopy;  Laterality: N/A;  GE junction 37,   ESOPHAGOGASTRODUODENOSCOPY (EGD) WITH PROPOFOL N/A 07/01/2013   Procedure: ESOPHAGOGASTRODUODENOSCOPY (EGD) WITH PROPOFOL;  Surgeon: Malissa Hippo, MD;  [facility];  Service: Endoscopy;  Laterality: N/A;  950   MALONEY DILATION N/A 12/31/2012   Procedure: MALONEY DILATION;  Surgeon: Malissa Hippo, MD;  [facility];  Service: Endoscopy;  Laterality: N/A;  used a # 54,#56   MALONEY DILATION N/A 07/01/2013   Procedure: MALONEY DILATION;  Surgeon: Malissa Hippo, MD;  [facility];  Service: Endoscopy;  Laterality: N/A;  54/58; no heme present   POLYPECTOMY  02/05/2018   Procedure: POLYPECTOMY;  Surgeon: Malissa Hippo, MD;  [facility];  Service: Endoscopy;;  distal rectum (HS)   STONE EXTRACTION WITH BASKET Right 12/22/2019   Procedure: STONE EXTRACTION WITH BASKET;  Surgeon: Malen Gauze, MD;  Location: [facility];  Service: Urology;  Laterality: Right;   Past Medical History:  Diagnosis Date   Anxiety and depression    Arthritis    CAD (coronary artery disease)    Stent placement circumflex coronary 2007, catheterization 2008 patent stents. 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    High cholesterol    Hx of blood clots    Hypertension    MI (myocardial infarction) (HCC)    2007   Obstructive sleep apnea    CPAP, setting ?2   Palpitations    Sleep apnea    Tachycardia    Ht 5\' 4"  (1.626 m)   Wt 245 lb (111.1 kg)   BMI 42.05 kg/m   Opioid Risk Score:   Fall Risk Score:  `1  Depression screen PHQ  2/9     08/26/2022    1:53 PM 07/28/2022   11:51 AM 10/17/2021    1:56 PM 08/09/2021    1:00 PM 05/16/2021    1:15 PM 10/11/2020  1:04 PM 01/24/2020    1:19 PM  Depression screen PHQ 2/9  Decreased Interest 1 1 3 1  0 1 0  Down, Depressed, Hopeless 1 1 3 1 3   0  PHQ - 2 Score 2 2 6 2 3 1  0  Altered sleeping   0      Tired, decreased energy   1      Change in appetite   0      Feeling bad or failure about yourself    0      Trouble concentrating   1      Moving slowly or fidgety/restless   0      Suicidal thoughts   0      PHQ-9 Score   8      Difficult doing work/chores   Not difficult at all        Review of Systems     Objective:   Physical Exam        Assessment & Plan:

## 2022-08-28 ENCOUNTER — Ambulatory Visit (INDEPENDENT_AMBULATORY_CARE_PROVIDER_SITE_OTHER): Payer: 59 | Admitting: Neurology

## 2022-08-28 ENCOUNTER — Encounter: Payer: Self-pay | Admitting: Neurology

## 2022-08-28 VITALS — BP 138/73 | HR 80 | Ht 64.0 in | Wt 245.0 lb

## 2022-08-28 DIAGNOSIS — Z8673 Personal history of transient ischemic attack (TIA), and cerebral infarction without residual deficits: Secondary | ICD-10-CM | POA: Diagnosis not present

## 2022-08-28 DIAGNOSIS — G44209 Tension-type headache, unspecified, not intractable: Secondary | ICD-10-CM

## 2022-08-28 DIAGNOSIS — G4452 New daily persistent headache (NDPH): Secondary | ICD-10-CM | POA: Diagnosis not present

## 2022-08-28 DIAGNOSIS — E1142 Type 2 diabetes mellitus with diabetic polyneuropathy: Secondary | ICD-10-CM

## 2022-08-28 MED ORDER — TOPIRAMATE 50 MG PO TABS
50.0000 mg | ORAL_TABLET | Freq: Two times a day (BID) | ORAL | 3 refills | Status: DC
Start: 2022-08-28 — End: 2023-01-08

## 2022-08-28 NOTE — Progress Notes (Signed)
[facility] [address] [place]. [place] [address] [phone]       OFFICE CONSULT NOTE  [name] Date of Birth:  1950-10-27 Medical Record Number:  [id number]   Referring MD:  Dr Melynda Ripple  Reason for Referral:  TIA  HPI: [name] is a 72 year old Caucasian lady seen today for office consultation visit.  History is obtained from husband and review of electronic medical records and I personally reviewed pertinent available imaging films in PACS.  She has acomplex medical history that includes coronary artery disease, CHF, COPD, diabetes, obesity, sleep apnea, prior history of MI status post PCI with stent placement, anxiety, depression, she presented on 05/08/2022 for evaluation for left arm numbness and weakness along with slurred speech.  In the emergency room her blood pressure was significantly elevated at 220/140.  CT head was unremarkable.  Code stroke was called and neurohospitalist on evaluation found mild generalized weakness in all 4 extremities with subjective heaviness on the left side and some slurred speech.  [name] was not considered candidate for thrombolysis or thrombectomy.  CT scan as well as MRI scan of the brain were obtained which showed no acute abnormality.  CT angiogram showed severe bilateral M2 branch stenosis as well as moderate right P2 and right M1 stenosis as well.  Echocardiogram showed ejection fraction of 60 to 65%.  LDL cholesterol was 78 mg percent and hemoglobin A1c was 8.8.  [name] was advised to be on aspirin.  He states is done well since then.  She is back to her outpatient MRI scans done on 06/09/2022 as well as 07/09/2022: Which she states is because of headaches but imaging report documents slurred speech and further scan.  [name]'s chief complaint today is new onset of nearly daily persistent headaches.  This began in January.  Describes the headache is now occurring every other day.  Describes this as being retro-orbital and  burning sensation.  At times it is in the back of the head as well.  This varies in severity from 4/10 to 7/10.  She can tolerate the headache most occasions but she does take Mobic every day for arthritis as well as some Tylenol partial relief.  She does admit to mild nausea and light and occasional sound sensitivity with her headaches but denies any visual disturbance accompanying the headache.  She denies any prior known history of migraine headaches.  She does complain of some neck muscle tightness and pain during these headaches.  She also complains of upper respiratory failure in the feet, diabetic neuropathy.  She is currently on gabapentin 300 mg 3 times daily she is developed some leg swelling and weight gain on that.  She has never tried Topamax or Lyrica..  Last CT scan of the head was on 08/06/2022 which showed no acute abnormality.  ROS:   14 system review of systems is positive for headache, nausea, tingling, numbness, burning and all other systems negative  PMH:  Past Medical History:  Diagnosis Date   Anxiety and depression    Arthritis    CAD (coronary artery disease)    Stent placement circumflex coronary 2007, catheterization 2008 patent stents. 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    High cholesterol    Hx of blood clots    Hypertension    MI (myocardial infarction) (HCC)    2007   Obstructive sleep apnea    CPAP, setting ?2   Palpitations    Sleep apnea    Tachycardia     Social History:  Social History   Socioeconomic History   Marital status: Divorced    Spouse name: Not on file   Number of  children: 4   Years of education: Not on file   Highest education level: Not on file  Occupational History   Occupation: Disabled  Tobacco Use   Smoking status: Never   Smokeless tobacco: Never  Vaping Use   Vaping Use: Never used  Substance and Sexual Activity   Alcohol use: No    Alcohol/week: 0.0 standard drinks of alcohol   Drug use: No   Sexual activity: Not on file  Other Topics Concern   Not on file  Social History Narrative   [name] does not get regular exercise   Denies caffeine use    Social Determinants of Health   Financial Resource Strain: Low Risk  (07/21/2022)   Overall Financial Resource Strain (CARDIA)    Difficulty of Paying Living Expenses: Not very hard  Food Insecurity: No Food Insecurity (07/21/2022)   Hunger Vital Sign    Worried About Running Out of Food in the Last Year: Never true    Ran Out of Food in the Last Year: Never true  Transportation Needs: No Transportation Needs (07/21/2022)   PRAPARE - Administrator, Civil Service (Medical): No    Lack of Transportation (Non-Medical): No  Physical Activity: Inactive (07/21/2022)   Exercise Vital Sign    Days of Exercise per Week: 0 days    Minutes of Exercise per Session: 0 min  Stress: Not on file  Social Connections: Not on file  Intimate Partner Violence: Not At Risk (05/08/2022)   Humiliation, Afraid, Rape, and Kick questionnaire    Fear of Current or Ex-Partner: No    Emotionally Abused: No    Physically Abused: No    Sexually Abused: No    Medications:   Current Outpatient Medications on File Prior to Visit  Medication Sig Dispense Refill   acetaminophen (TYLENOL) 325 MG tablet Take 2 tablets (650 mg total) by mouth every 4 (four) hours as needed for mild pain (or temp > 37.5 C (99.5 F)). 100 tablet 4   ALPRAZolam (XANAX) 0.5 MG tablet Take 1 tablet (0.5 mg total) by mouth 2 (two) times daily as needed for anxiety or sleep. 12 tablet 0   aspirin EC 81 MG tablet Take 1 tablet (81 mg  total) by mouth daily with breakfast. -Take Aspirin 81 mg daily along with Plavix 75 mg daily for 21 days then after that STOP the Plavix  and continue ONLY Aspirin 81 mg daily indefinitely-- 30 tablet 11   atorvastatin (LIPITOR) 40 MG tablet Take 40 mg by mouth daily.     B-D ULTRAFINE III SHORT PEN 31G X 8 MM MISC Inject into the skin 4 (four) times daily.     Blood Glucose Monitoring Suppl (ACCU-CHEK GUIDE ME) w/Device KIT 1 Piece by Does not apply route as directed. 1 kit 0   buPROPion ER (WELLBUTRIN SR) 100 MG 12  hr tablet Take 1 tablet (100 mg total) by mouth daily. 30 tablet 2   Cholecalciferol (VITAMIN D3) 125 MCG (5000 UT) CAPS TAKE ONE CAPSULE BY MOUTH DAILY ([name] taking differently: Take 5,000 Units by mouth daily.) 90 capsule 0   diclofenac Sodium (VOLTAREN) 1 % GEL Apply topically.     doxepin (SINEQUAN) 25 MG capsule Take 75 mg by mouth at bedtime.      FEROSUL 325 (65 Fe) MG tablet Take 325 mg by mouth every morning.     furosemide (LASIX) 40 MG tablet TAKE 1 AND 1/2 TABLETS BY MOUTH DAILY 135 tablet 0   glipiZIDE (GLUCOTROL XL) 5 MG 24 hr tablet Take 5 mg by mouth daily.     Insulin Lispro Prot & Lispro (HUMALOG 75/25 MIX) (75-25) 100 UNIT/ML Kwikpen Inject 90 Units into the skin 2 (two) times daily with a meal. 135 mL 2   isosorbide mononitrate (IMDUR) 30 MG 24 hr tablet TAKE 1/2 TABLET BY MOUTH DAILY (Need TO make APPOINTMENT FOR NEXT refills) ([name] taking differently: Take 30 mg by mouth daily. Take 1/2 tablet daily) 3 tablet 0   lidocaine (XYLOCAINE) 5 % ointment Apply topically 2 (two) times daily.     lisinopril (ZESTRIL) 20 MG tablet Take 1 tablet (20 mg total) by mouth daily. 90 tablet 3   meloxicam (MOBIC) 7.5 MG tablet Take 7.5 mg by mouth daily.     metFORMIN (GLUCOPHAGE) 1000 MG tablet Take 1 tablet (1,000 mg total) by mouth 2 (two) times daily with a meal. 60 tablet 3   methocarbamol (ROBAXIN) 500 MG tablet Take 500 mg by mouth 2 (two) times daily.      metoCLOPramide (REGLAN) 5 MG tablet Take 5 mg by mouth 3 (three) times daily as needed for nausea or vomiting.   1   metoprolol succinate (TOPROL-XL) 100 MG 24 hr tablet Take 100 mg by mouth daily. Take with or immediately following a meal.     montelukast (SINGULAIR) 10 MG tablet Take 10 mg by mouth at bedtime.      Multiple Vitamin (MULTIVITAMIN) capsule Take 1 capsule by mouth daily.     naloxone (NARCAN) nasal spray 4 mg/0.1 mL SMARTSIG:Both Nares     nitroGLYCERIN (NITROSTAT) 0.4 MG SL tablet Place 1 tablet (0.4 mg total) under the tongue every 5 (five) minutes as needed for chest pain (up to 3 doses. If taking 3rd dose call 911). 25 tablet 3   ondansetron (ZOFRAN) 4 MG tablet Take 4 mg by mouth every other day.     oxyCODONE-acetaminophen (PERCOCET) 10-325 MG tablet Take 1 tablet by mouth every 6 (six) hours as needed for pain. 120 tablet 0   pantoprazole (PROTONIX) 40 MG tablet TAKE 1 TABLET BY MOUTH TWICE DAILY BEFORE MEALS ([name] taking differently: Take 40 mg by mouth 2 (two) times daily before a meal.) 60 tablet 1   polyethylene glycol (MIRALAX / GLYCOLAX) 17 g packet Take 17 g by mouth daily as needed for moderate constipation.     potassium chloride (KLOR-CON) 10 MEQ tablet Take 10 mEq by mouth 2 (two) times daily.     PROAIR HFA 108 (90 Base) MCG/ACT inhaler Inhale 2 puffs into the lungs every 4 (four) hours as needed for wheezing or shortness of breath.   8   promethazine (PHENERGAN) 25 MG tablet Take 25 mg by mouth 4 (four) times daily as needed.     Semaglutide,0.25 or 0.5MG /DOS, 2 MG/3ML SOPN Inject 0.25 mg into the skin once  a week. For 4 weeks, then increase to 0.5 mg weekly 3 mL 1   senna-docusate (SENOKOT-S) 8.6-50 MG tablet Take 2 tablets by mouth at bedtime. 60 tablet 2   SSD 1 % cream Apply topically 2 (two) times daily.     SURE COMFORT INS SYR 1CC/30G 30G X 5/16" 1 ML MISC 1 each by Other route daily.     ferrous sulfate 325 (65 FE) MG EC tablet Take by mouth.      gabapentin (NEURONTIN) 300 MG capsule Take 1 capsule (300 mg total) by mouth 3 (three) times daily. 90 capsule 0   No current facility-administered medications on file prior to visit.    Allergies:   Allergies  Allergen Reactions   Amphetamine-Dextroamphetamine Swelling   Nitrofuran Derivatives Itching and Swelling   Amphetamine-Dextroamphet Er Swelling   Pregabalin Swelling   Topiramate Other (See Comments)    Tongue tingle   Verelan [Verapamil] Rash    Physical Exam General: Obese middle-aged Caucasian lady seated, in no evident distress Head: head normocephalic and atraumatic.   Neck: supple with no carotid or supraclavicular bruits Cardiovascular: regular rate and rhythm, no murmurs Musculoskeletal: no deformity Skin:  no rash/petichiae Vascular:  Normal pulses all extremities  Neurologic Exam Mental Status: Awake and fully alert. Oriented to place and time. Recent and remote memory intact. Attention span, concentration and fund of knowledge appropriate. Mood and affect appropriate.  Cranial Nerves: Fundoscopic exam reveals sharp disc margins. Pupils equal, briskly reactive to light. Extraocular movements full without nystagmus. Visual fields full to confrontation. Hearing intact. Facial sensation intact. Face, tongue, palate moves normally and symmetrically.  Motor: Normal bulk and tone. Normal strength in all tested extremity muscles. Sensory.:  Diminished touch , pinprick , position and vibratory sensation in both feet from.  Coordination: Rapid alternating movements normal in all extremities. Finger-to-nose and heel-to-shin performed accurately bilaterally.  Unable to stand on a narrow-based or on either foot unsupported.  Romberg's positive. Gait and Station: Arises from chair without difficulty. Stance is normal. Gait is broad-based and uses a wheeled walker.  Unable to heel, toe and tandem walk without difficulty.  Reflexes: 1+ and symmetric. Toes downgoing.       ASSESSMENT: 72 year old lady with new onset persistent headaches since 6 months likely transformed tension headaches with component of analgesic rebound.  She also has lower extremity paresthesias from diabetic peripheral neuropathy    PLAN:I had a long discussion with the [name] regarding her almost daily headaches history of present transformed tension headaches and high recommend she minimize taking over-the-counter pain pills like Tylenol and do regular neck stretching and diabetes.  Trial of Topamax 50 mg twice daily for the headaches and neuropathy pain from diabetic neuropathy.  Aspirin for stroke prevention and aggressive risk factor modification with strict control of diabetes with hemoglobin A1c goal below 7, lipids with LDL cholesterol goal below 70 mg percent and hypertension with blood pressure goal below 130/90.  She will return for follow-up in the future in 3 months with my nurse practitioner or call earlier if necessary.  Greater than 50% time during this 45-minute consultation visit was spent on counseling and coordination of care about daily persistent headache as well as paresthesias and discussing evaluation and treatment  Delia Heady, MD Note: This document was prepared with digital dictation and possible smart phrase technology. Any transcriptional errors that result from this process are unintentional.

## 2022-08-28 NOTE — Patient Instructions (Addendum)
I had a long discussion with the patient regarding her new persistent headache for the last 6 months likely represent transformed migraine headaches with tension features.  There may also be a component of analgesic rebound.  Recommend she minimize taking over-the-counter pain pills do regular neck stretching and diabetes.  Trial of Topamax 50 mg twice daily for the headaches and neuropathy pain from diabetic neuropathy.  Aspirin for stroke prevention and aggressive risk factor modification with strict control of diabetes with hemoglobin A1c goal below 7, lipids with LDL cholesterol goal below 70 mg percent and hypertension with blood pressure goal below 130/90.  She will return for follow-up in the future in 3 months with my nurse practitioner or call earlier if necessary.  Neck Exercises Ask your health care provider which exercises are safe for you. Do exercises exactly as told by your health care provider and adjust them as directed. It is normal to feel mild stretching, pulling, tightness, or discomfort as you do these exercises. Stop right away if you feel sudden pain or your pain gets worse. Do not begin these exercises until told by your health care provider. Neck exercises can be important for many reasons. They can improve strength and maintain flexibility in your neck, which will help your upper back and prevent neck pain. Stretching exercises Rotation neck stretching  Sit in a chair or stand up. Place your feet flat on the floor, shoulder-width apart. Slowly turn your head (rotate) to the right until a slight stretch is felt. Turn it all the way to the right so you can look over your right shoulder. Do not tilt or tip your head. Hold this position for 10-30 seconds. Slowly turn your head (rotate) to the left until a slight stretch is felt. Turn it all the way to the left so you can look over your left shoulder. Do not tilt or tip your head. Hold this position for 10-30 seconds. Repeat  __________ times. Complete this exercise __________ times a day. Neck retraction  Sit in a sturdy chair or stand up. Look straight ahead. Do not bend your neck. Use your fingers to push your chin backward (retraction). Do not bend your neck for this movement. Continue to face straight ahead. If you are doing the exercise properly, you will feel a slight sensation in your throat and a stretch at the back of your neck. Hold the stretch for 1-2 seconds. Repeat __________ times. Complete this exercise __________ times a day. Strengthening exercises Neck press  Lie on your back on a firm bed or on the floor with a pillow under your head. Use your neck muscles to push your head down on the pillow and straighten your spine. Hold the position as well as you can. Keep your head facing up (in a neutral position) and your chin tucked. Slowly count to 5 while holding this position. Repeat __________ times. Complete this exercise __________ times a day. Isometrics These are exercises in which you strengthen the muscles in your neck while keeping your neck still (isometrics). Sit in a supportive chair and place your hand on your forehead. Keep your head and face facing straight ahead. Do not flex or extend your neck while doing isometrics. Push forward with your head and neck while pushing back with your hand. Hold for 10 seconds. Do the sequence again, this time putting your hand against the back of your head. Use your head and neck to push backward against the hand pressure. Finally, do the same  exercise on either side of your head, pushing sideways against the pressure of your hand. Repeat __________ times. Complete this exercise __________ times a day. Prone head lifts  Lie face-down (prone position), resting on your elbows so that your chest and upper back are raised. Start with your head facing downward, near your chest. Position your chin either on or near your chest. Slowly lift your head  upward. Lift until you are looking straight ahead. Then continue lifting your head as far back as you can comfortably stretch. Hold your head up for 5 seconds. Then slowly lower it to your starting position. Repeat __________ times. Complete this exercise __________ times a day. Supine head lifts  Lie on your back (supine position), bending your knees to point to the ceiling and keeping your feet flat on the floor. Lift your head slowly off the floor, raising your chin toward your chest. Hold for 5 seconds. Repeat __________ times. Complete this exercise __________ times a day. Scapular retraction  Stand with your arms at your sides. Look straight ahead. Slowly pull both shoulders (scapulae) backward and downward (retraction) until you feel a stretch between your shoulder blades in your upper back. Hold for 10-30 seconds. Relax and repeat. Repeat __________ times. Complete this exercise __________ times a day. Contact a health care provider if: Your neck pain or discomfort gets worse when you do an exercise. Your neck pain or discomfort does not improve within 2 hours after you exercise. If you have any of these problems, stop exercising right away. Do not do the exercises again unless your health care provider says that you can. Get help right away if: You develop sudden, severe neck pain. If this happens, stop exercising right away. Do not do the exercises again unless your health care provider says that you can. This information is not intended to replace advice given to you by your health care provider. Make sure you discuss any questions you have with your health care provider. Document Revised: 08/28/2020 Document Reviewed: 08/28/2020 Elsevier Patient Education  2024 [employer]   Tension Headache, Adult A tension headache is a feeling of pain, pressure, or aching over the front and sides of the head. The pain can be dull, or it can feel tight. There are two types of tension  headache: Episodic tension headache. This is when the headaches happen fewer than 15 days a month. Chronic tension headache. This is when the headaches happen more than 15 days a month during a 96-month period. A tension headache can last from 30 minutes to several days. It is the most common kind of headache. Tension headaches are not normally associated with nausea or vomiting, and they do not get worse with physical activity. What are the causes? The exact cause of this condition is not known. Tension headaches are often triggered by stress, anxiety, or depression. Other triggers may include: Alcohol. Too much caffeine or caffeine withdrawal. Respiratory infections, such as colds, flu, or sinus infections. Dental problems or teeth clenching. Fatigue. Holding your head and neck in the same position for a long period of time, such as while using a computer. Smoking. Arthritis of the neck. What are the signs or symptoms? Symptoms of this condition include: A feeling of pressure or tightness around the head. Dull, aching head pain. Pain over the front and sides of the head. Tenderness in the muscles of the head, neck, and shoulders. How is this diagnosed? This condition may be diagnosed based on your symptoms, your medical  history, and a physical exam. If your symptoms are severe or unusual, you may have imaging tests, such as a CT scan or an MRI of your head. Your vision may also be checked. How is this treated? This condition may be treated with lifestyle changes and with medicines that help relieve symptoms. Follow these instructions at home: Managing pain Take over-the-counter and prescription medicines only as told by your health care provider. When you have a headache, lie down in a dark, quiet room. If directed, put ice on your head and neck. To do this: Put ice in a plastic bag. Place a towel between your skin and the bag. Leave the ice on for 20 minutes, 2-3 times a  day. Remove the ice if your skin turns bright red. This is very important. If you cannot feel pain, heat, or cold, you have a greater risk of damage to the area. If directed, apply heat to the back of your neck as often as told by your health care provider. Use the heat source that your health care provider recommends, such as a moist heat pack or a heating pad. Place a towel between your skin and the heat source. Leave the heat on for 20-30 minutes. Remove the heat if your skin turns bright red. This is especially important if you are unable to feel pain, heat, or cold. You have a greater risk of getting burned. Eating and drinking Eat meals on a regular schedule. If you drink alcohol: Limit how much you have to: 0-1 drink a day for women who are not pregnant. 0-2 drinks a day for men. Know how much alcohol is in your drink. In the [place] one drink equals one 12 oz bottle of beer (355 mL), one 5 oz glass of wine (148 mL), or one 1 oz glass of hard liquor (44 mL). Drink enough fluid to keep your urine pale yellow. Decrease your caffeine intake, or stop using caffeine. Lifestyle Get 7-9 hours of sleep each night, or get the amount of sleep recommended by your health care provider. At bedtime, remove computers, phones, and tablets from your room. Find ways to manage your stress. This may include: Exercise. Deep breathing exercises. Yoga. Listening to music. Positive mental imagery. Try to sit up straight and avoid tensing your muscles. Do not use any products that contain nicotine or tobacco. These include cigarettes, chewing tobacco, and vaping devices, such as e-cigarettes. If you need help quitting, ask your health care provider. General instructions  Avoid any headache triggers. Keep a journal to help find out what may trigger your headaches. For example, write down: What you eat and drink. How much sleep you get. Any change to your diet or medicines. Keep all follow-up visits. This  is important. Contact a health care provider if: Your headache does not get better. Your headache comes back. You are sensitive to sounds, light, or smells because of a headache. You have nausea or you vomit. Your stomach hurts. Get help right away if: You suddenly develop a severe headache, along with any of the following: A stiff neck. Nausea and vomiting. Confusion. Weakness in one part or one side of your body. Double vision or loss of vision. Shortness of breath. Rash. Unusual sleepiness. Fever or chills. Trouble speaking. Pain in your eye or ear. Trouble walking or balancing. Feeling faint or passing out. Summary A tension headache is a feeling of pain, pressure, or aching over the front and sides of the head. A tension headache can  last from 30 minutes to several days. It is the most common kind of headache. This condition may be diagnosed based on your symptoms, your medical history, and a physical exam. This condition may be treated with lifestyle changes and with medicines that help relieve symptoms. This information is not intended to replace advice given to you by your health care provider. Make sure you discuss any questions you have with your health care provider. Document Revised: 12/01/2019 Document Reviewed: 12/01/2019 Elsevier Patient Education  2024 [employer].

## 2022-09-02 DIAGNOSIS — I2510 Atherosclerotic heart disease of native coronary artery without angina pectoris: Secondary | ICD-10-CM | POA: Diagnosis not present

## 2022-09-02 DIAGNOSIS — A419 Sepsis, unspecified organism: Secondary | ICD-10-CM | POA: Diagnosis not present

## 2022-09-02 DIAGNOSIS — E7800 Pure hypercholesterolemia, unspecified: Secondary | ICD-10-CM | POA: Diagnosis not present

## 2022-09-02 DIAGNOSIS — I69354 Hemiplegia and hemiparesis following cerebral infarction affecting left non-dominant side: Secondary | ICD-10-CM | POA: Diagnosis not present

## 2022-09-02 DIAGNOSIS — I69320 Aphasia following cerebral infarction: Secondary | ICD-10-CM | POA: Diagnosis not present

## 2022-09-02 DIAGNOSIS — M1990 Unspecified osteoarthritis, unspecified site: Secondary | ICD-10-CM | POA: Diagnosis not present

## 2022-09-02 DIAGNOSIS — E1165 Type 2 diabetes mellitus with hyperglycemia: Secondary | ICD-10-CM | POA: Diagnosis not present

## 2022-09-02 DIAGNOSIS — G4733 Obstructive sleep apnea (adult) (pediatric): Secondary | ICD-10-CM | POA: Diagnosis not present

## 2022-09-02 DIAGNOSIS — K219 Gastro-esophageal reflux disease without esophagitis: Secondary | ICD-10-CM | POA: Diagnosis not present

## 2022-09-02 DIAGNOSIS — D649 Anemia, unspecified: Secondary | ICD-10-CM | POA: Diagnosis not present

## 2022-09-02 DIAGNOSIS — E1140 Type 2 diabetes mellitus with diabetic neuropathy, unspecified: Secondary | ICD-10-CM | POA: Diagnosis not present

## 2022-09-02 DIAGNOSIS — Z7902 Long term (current) use of antithrombotics/antiplatelets: Secondary | ICD-10-CM | POA: Diagnosis not present

## 2022-09-02 DIAGNOSIS — Z7982 Long term (current) use of aspirin: Secondary | ICD-10-CM | POA: Diagnosis not present

## 2022-09-02 DIAGNOSIS — I110 Hypertensive heart disease with heart failure: Secondary | ICD-10-CM | POA: Diagnosis not present

## 2022-09-02 DIAGNOSIS — Z7984 Long term (current) use of oral hypoglycemic drugs: Secondary | ICD-10-CM | POA: Diagnosis not present

## 2022-09-02 DIAGNOSIS — F32A Depression, unspecified: Secondary | ICD-10-CM | POA: Diagnosis not present

## 2022-09-02 DIAGNOSIS — J449 Chronic obstructive pulmonary disease, unspecified: Secondary | ICD-10-CM | POA: Diagnosis not present

## 2022-09-02 DIAGNOSIS — I5032 Chronic diastolic (congestive) heart failure: Secondary | ICD-10-CM | POA: Diagnosis not present

## 2022-09-02 DIAGNOSIS — I252 Old myocardial infarction: Secondary | ICD-10-CM | POA: Diagnosis not present

## 2022-09-02 DIAGNOSIS — Z794 Long term (current) use of insulin: Secondary | ICD-10-CM | POA: Diagnosis not present

## 2022-09-02 DIAGNOSIS — E559 Vitamin D deficiency, unspecified: Secondary | ICD-10-CM | POA: Diagnosis not present

## 2022-09-02 DIAGNOSIS — N12 Tubulo-interstitial nephritis, not specified as acute or chronic: Secondary | ICD-10-CM | POA: Diagnosis not present

## 2022-09-26 ENCOUNTER — Encounter: Payer: 59 | Admitting: Registered Nurse

## 2022-10-02 ENCOUNTER — Telehealth: Payer: Self-pay | Admitting: *Deleted

## 2022-10-02 DIAGNOSIS — R5383 Other fatigue: Secondary | ICD-10-CM | POA: Diagnosis not present

## 2022-10-02 DIAGNOSIS — G629 Polyneuropathy, unspecified: Secondary | ICD-10-CM | POA: Diagnosis not present

## 2022-10-02 NOTE — Telephone Encounter (Signed)
Requesting refill on Oxycodone at [facility] Last fill 09/04/22 Pt has appt to see [facility] 10/06/22

## 2022-10-03 MED ORDER — OXYCODONE-ACETAMINOPHEN 10-325 MG PO TABS: 1.0000 | ORAL_TABLET | Freq: Four times a day (QID) | ORAL | 0 refills | Status: DC | PRN

## 2022-10-03 NOTE — Addendum Note (Signed)
Addended by: Erick Colace on: 10/03/2022 02:06 PM   Modules accepted: Orders

## 2022-10-06 ENCOUNTER — Ambulatory Visit: Payer: 59 | Admitting: Registered Nurse

## 2022-10-06 ENCOUNTER — Encounter: Payer: Self-pay | Admitting: Registered Nurse

## 2022-10-06 ENCOUNTER — Encounter: Payer: 59 | Attending: Registered Nurse | Admitting: Registered Nurse

## 2022-10-06 VITALS — BP 100/66 | HR 81 | Ht 64.0 in | Wt 237.0 lb

## 2022-10-06 DIAGNOSIS — Z79891 Long term (current) use of opiate analgesic: Secondary | ICD-10-CM | POA: Diagnosis not present

## 2022-10-06 DIAGNOSIS — G8929 Other chronic pain: Secondary | ICD-10-CM | POA: Diagnosis not present

## 2022-10-06 DIAGNOSIS — M542 Cervicalgia: Secondary | ICD-10-CM | POA: Diagnosis not present

## 2022-10-06 DIAGNOSIS — G894 Chronic pain syndrome: Secondary | ICD-10-CM | POA: Insufficient documentation

## 2022-10-06 DIAGNOSIS — M25512 Pain in left shoulder: Secondary | ICD-10-CM | POA: Insufficient documentation

## 2022-10-06 DIAGNOSIS — M7062 Trochanteric bursitis, left hip: Secondary | ICD-10-CM | POA: Insufficient documentation

## 2022-10-06 DIAGNOSIS — M25511 Pain in right shoulder: Secondary | ICD-10-CM | POA: Diagnosis not present

## 2022-10-06 DIAGNOSIS — M5416 Radiculopathy, lumbar region: Secondary | ICD-10-CM | POA: Diagnosis not present

## 2022-10-06 DIAGNOSIS — Z5181 Encounter for therapeutic drug level monitoring: Secondary | ICD-10-CM | POA: Diagnosis not present

## 2022-10-06 DIAGNOSIS — M1711 Unilateral primary osteoarthritis, right knee: Secondary | ICD-10-CM | POA: Insufficient documentation

## 2022-10-06 DIAGNOSIS — M7061 Trochanteric bursitis, right hip: Secondary | ICD-10-CM | POA: Insufficient documentation

## 2022-10-06 DIAGNOSIS — M5412 Radiculopathy, cervical region: Secondary | ICD-10-CM | POA: Diagnosis not present

## 2022-10-06 DIAGNOSIS — M1712 Unilateral primary osteoarthritis, left knee: Secondary | ICD-10-CM | POA: Insufficient documentation

## 2022-10-06 DIAGNOSIS — M48062 Spinal stenosis, lumbar region with neurogenic claudication: Secondary | ICD-10-CM | POA: Insufficient documentation

## 2022-10-06 DIAGNOSIS — M546 Pain in thoracic spine: Secondary | ICD-10-CM | POA: Insufficient documentation

## 2022-10-06 MED ORDER — OXYCODONE-ACETAMINOPHEN 10-325 MG PO TABS: 1.0000 | ORAL_TABLET | Freq: Four times a day (QID) | ORAL | 0 refills | Status: DC | PRN

## 2022-10-06 NOTE — Progress Notes (Signed)
Subjective:    Patient ID: [name], female    DOB: [birth year], 72 y.o.   MRN: [id number]  HPI: [name] is a 72 y.o. female who returns for follow up appointment for chronic pain and medication refill. She states her pain is located in her neck radiating into her bilateral shoulders, mid- lower back pain radiating into her bilateral hips R>L and bilateral lower extremities. She rates her pain 8. Her current exercise regime is walking and performing stretching exercises.  Ms. Wehbe Morphine equivalent is 60.00 MME. She is also prescribed Alprazolam by Denton Meek .We have discussed the black box warning of using opioids and benzodiazepines. I highlighted the dangers of using these drugs together and discussed the adverse events including respiratory suppression, overdose, cognitive impairment and importance of compliance with current regimen. We will continue to monitor and adjust as indicated.   Last Oral Swab was Performed on 07/03/2022, it was consistent.    Pain Inventory Average Pain 8 Pain Right Now 8 My pain is constant, sharp, burning, dull, stabbing, tingling, and aching  In the last 24 hours, has pain interfered with the following? General activity 0 Relation with others 0 Enjoyment of life 0 What TIME of day is your pain at its worst? morning , daytime, evening, and night Sleep (in general) NA  Pain is worse with: walking, bending, sitting, inactivity, and standing Pain improves with: medication Relief from Meds: 7  Family History  Problem Relation Age of Onset   Heart attack Mother    Colon cancer Mother    Heart attack Father    Stroke Sister    Hemophilia Child    Cancer Other    Coronary artery disease Other    Cancer Brother    Cancer Maternal Aunt    Cancer Maternal Uncle    Cancer Paternal Aunt    Cancer Paternal Uncle    Cancer Brother    Social History   Socioeconomic History   Marital status: Divorced    Spouse name: Not on file    Number of children: 4   Years of education: Not on file   Highest education level: Not on file  Occupational History   Occupation: Disabled  Tobacco Use   Smoking status: Never   Smokeless tobacco: Never  Vaping Use   Vaping status: Never Used  Substance and Sexual Activity   Alcohol use: No    Alcohol/week: 0.0 standard drinks of alcohol   Drug use: No   Sexual activity: Not on file  Other Topics Concern   Not on file  Social History Narrative   Patient does not get regular exercise   Denies caffeine use    Social Determinants of Health   Financial Resource Strain: Low Risk  (07/21/2022)   Overall Financial Resource Strain (CARDIA)    Difficulty of Paying Living Expenses: Not very hard  Food Insecurity: No Food Insecurity (07/21/2022)   Hunger Vital Sign    Worried About Running Out of Food in the Last Year: Never true    Ran Out of Food in the Last Year: Never true  Transportation Needs: No Transportation Needs (07/21/2022)   PRAPARE - Administrator, Civil Service (Medical): No    Lack of Transportation (Non-Medical): No  Physical Activity: Inactive (07/21/2022)   Exercise Vital Sign    Days of Exercise per Week: 0 days    Minutes of Exercise per Session: 0 min  Stress: Stress Concern Present (10/18/2018)  Received from [facility], [facility]   [facility] - Occupational Stress Questionnaire    Feeling of Stress : To some extent  Social Connections: Moderately Integrated (10/18/2018)   Received from [facility], [facility]   Social Connection and Isolation Panel [NHANES]    Frequency of Communication with Friends and Family: More than three times a week    Frequency of Social Gatherings with Friends and Family: More than three times a week    Attends Religious Services: More than 4 times per year    Active Member of [employer] or Organizations: No    Attends Banker Meetings: Never    Marital Status:  Living with partner   Past Surgical History:  Procedure Laterality Date   ABDOMINAL HYSTERECTOMY     Arm Surgery Bilateral    due to fall-pt broke both forearms   BIOPSY N/A 07/01/2013   Procedure: BIOPSY;  Surgeon: Malissa Hippo, MD;  [facility]: [facility];  Service: Endoscopy;  Laterality: N/A;   CHOLECYSTECTOMY     COLONOSCOPY  08/06/2011   Procedure: COLONOSCOPY;  Surgeon: Malissa Hippo, MD;  [facility];  Service: Endoscopy;  Laterality: N/A;  215   COLONOSCOPY WITH PROPOFOL N/A 12/31/2012   Procedure: COLONOSCOPY WITH PROPOFOL;  Surgeon: Malissa Hippo, MD;  Location: [facility];  Service: Endoscopy;  Laterality: N/A;  in cecum at 0814, total withdrawal time   COLONOSCOPY WITH PROPOFOL N/A 02/05/2018   Procedure: COLONOSCOPY WITH PROPOFOL;  Surgeon: Malissa Hippo, MD;  [facility];  Service: Endoscopy;  Laterality: N/A;  1:25   CORONARY ANGIOPLASTY WITH STENT PLACEMENT     CYSTOSCOPY W/ URETERAL STENT PLACEMENT Right 11/10/2019   at [facility]   CYSTOSCOPY WITH RETROGRADE PYELOGRAM, URETEROSCOPY AND STENT PLACEMENT Right 12/22/2019   Procedure: CYSTOSCOPY WITH RIGHT URETERAL STENT REMOVAL; RIGHT RETROGRADE PYELOGRAM,RIGHT URETEROSCOPY;  Surgeon: Malen Gauze, MD;  Location: [facility];  Service: Urology;  Laterality: Right;   ESOPHAGEAL DILATION N/A 06/07/2015   Procedure: ESOPHAGEAL DILATION;  Surgeon: Malissa Hippo, MD;  [facility];  Service: Endoscopy;  Laterality: N/A;   ESOPHAGOGASTRODUODENOSCOPY N/A 06/07/2015   Procedure: ESOPHAGOGASTRODUODENOSCOPY (EGD);  Surgeon: Malissa Hippo, MD;  [facility];  Service: Endoscopy;  Laterality: N/A;  2:45 - moved to 1:00 - Ann to notify pt   ESOPHAGOGASTRODUODENOSCOPY (EGD) WITH PROPOFOL N/A 12/31/2012   Procedure: ESOPHAGOGASTRODUODENOSCOPY (EGD) WITH PROPOFOL;  Surgeon: Malissa Hippo, MD;  [facility]: [facility];  Service: Endoscopy;  Laterality: N/A;  GE junction 37,    ESOPHAGOGASTRODUODENOSCOPY (EGD) WITH PROPOFOL N/A 07/01/2013   Procedure: ESOPHAGOGASTRODUODENOSCOPY (EGD) WITH PROPOFOL;  Surgeon: Malissa Hippo, MD;  [facility]: [facility];  Service: Endoscopy;  Laterality: N/A;  950   MALONEY DILATION N/A 12/31/2012   Procedure: MALONEY DILATION;  Surgeon: Malissa Hippo, MD;  [facility]: [facility];  Service: Endoscopy;  Laterality: N/A;  used a # 54,#56   MALONEY DILATION N/A 07/01/2013   Procedure: MALONEY DILATION;  Surgeon: Malissa Hippo, MD;  [facility]: [facility];  Service: Endoscopy;  Laterality: N/A;  54/58; no heme present   POLYPECTOMY  02/05/2018   Procedure: POLYPECTOMY;  Surgeon: Malissa Hippo, MD;  [facility];  Service: Endoscopy;;  distal rectum (HS)   STONE EXTRACTION WITH BASKET Right 12/22/2019   Procedure: STONE EXTRACTION WITH BASKET;  Surgeon: Malen Gauze, MD;  [facility]: [facility];  Service: Urology;  Laterality: Right;   Past Surgical History:  Procedure Laterality Date   ABDOMINAL HYSTERECTOMY     Arm Surgery Bilateral    due to fall-pt broke both forearms   BIOPSY N/A 07/01/2013   Procedure: BIOPSY;  Surgeon: Malissa Hippo, MD;  [facility];  Service: Endoscopy;  Laterality: N/A;   CHOLECYSTECTOMY     COLONOSCOPY  08/06/2011   Procedure: COLONOSCOPY;  Surgeon: Malissa Hippo, MD;  [facility];  Service: Endoscopy;  Laterality: N/A;  215   COLONOSCOPY WITH PROPOFOL N/A 12/31/2012   Procedure: COLONOSCOPY WITH PROPOFOL;  Surgeon: Malissa Hippo, MD;  [facility]: [facility];  Service: Endoscopy;  Laterality: N/A;  in cecum at 0814, total withdrawal time   COLONOSCOPY WITH PROPOFOL N/A 02/05/2018   Procedure: COLONOSCOPY WITH PROPOFOL;  Surgeon: Malissa Hippo, MD;  [facility];  Service: Endoscopy;  Laterality: N/A;  1:25   CORONARY ANGIOPLASTY WITH STENT PLACEMENT     CYSTOSCOPY W/ URETERAL STENT PLACEMENT Right 11/10/2019   at [facility]   CYSTOSCOPY WITH RETROGRADE PYELOGRAM, URETEROSCOPY AND  STENT PLACEMENT Right 12/22/2019   Procedure: CYSTOSCOPY WITH RIGHT URETERAL STENT REMOVAL; RIGHT RETROGRADE PYELOGRAM,RIGHT URETEROSCOPY;  Surgeon: Malen Gauze, MD;  [facility]: [facility];  Service: Urology;  Laterality: Right;   ESOPHAGEAL DILATION N/A 06/07/2015   Procedure: ESOPHAGEAL DILATION;  Surgeon: Malissa Hippo, MD;  [facility];  Service: Endoscopy;  Laterality: N/A;   ESOPHAGOGASTRODUODENOSCOPY N/A 06/07/2015   Procedure: ESOPHAGOGASTRODUODENOSCOPY (EGD);  Surgeon: Malissa Hippo, MD;  [facility];  Service: Endoscopy;  Laterality: N/A;  2:45 - moved to 1:00 - Ann to notify pt   ESOPHAGOGASTRODUODENOSCOPY (EGD) WITH PROPOFOL N/A 12/31/2012   Procedure: ESOPHAGOGASTRODUODENOSCOPY (EGD) WITH PROPOFOL;  Surgeon: Malissa Hippo, MD;  [facility];  Service: Endoscopy;  Laterality: N/A;  GE junction 37,   ESOPHAGOGASTRODUODENOSCOPY (EGD) WITH PROPOFOL N/A 07/01/2013   Procedure: ESOPHAGOGASTRODUODENOSCOPY (EGD) WITH PROPOFOL;  Surgeon: Malissa Hippo, MD;  [facility]: [facility];  Service: Endoscopy;  Laterality: N/A;  950   MALONEY DILATION N/A 12/31/2012   Procedure: MALONEY DILATION;  Surgeon: Malissa Hippo, MD;  [facility]: [facility];  Service: Endoscopy;  Laterality: N/A;  used a # 54,#56   MALONEY DILATION N/A 07/01/2013   Procedure: MALONEY DILATION;  Surgeon: Malissa Hippo, MD;  [facility]: [facility];  Service: Endoscopy;  Laterality: N/A;  54/58; no heme present   POLYPECTOMY  02/05/2018   Procedure: POLYPECTOMY;  Surgeon: Malissa Hippo, MD;  [facility];  Service: Endoscopy;;  distal rectum (HS)   STONE EXTRACTION WITH BASKET Right 12/22/2019   Procedure: STONE EXTRACTION WITH BASKET;  Surgeon: Malen Gauze, MD;  [facility]: [facility];  Service: Urology;  Laterality: Right;   Past Medical History:  Diagnosis Date   Anxiety and depression    Arthritis    CAD (coronary artery disease)    Stent placement circumflex coronary 2007,  catheterization 2008 patent stents. 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  Opioid Risk Score:   Fall Risk Score:  `1  Depression screen PHQ 2/9     08/26/2022    1:53 PM 07/28/2022   11:51 AM 10/17/2021    1:56 PM 08/09/2021    1:00 PM 05/16/2021    1:15 PM 10/11/2020    1:04 PM 01/24/2020    1:19 PM  Depression screen PHQ 2/9  Decreased Interest 1 1 3 1  0 1 0  Down, Depressed, Hopeless 1 1 3 1 3   0  PHQ - 2 Score 2 2 6 2 3 1  0  Altered sleeping   0      Tired, decreased energy   1      Change in appetite   0      Feeling bad or failure about yourself    0      Trouble concentrating   1      Moving slowly or fidgety/restless   0      Suicidal thoughts   0      PHQ-9 Score   8      Difficult doing work/chores   Not difficult at all          Review of Systems  Constitutional: Negative.   HENT: Negative.    Eyes: Negative.   Respiratory: Negative.    Cardiovascular: Negative.   Gastrointestinal: Negative.   Endocrine: Negative.   Genitourinary: Negative.   Musculoskeletal:  Positive for arthralgias, back pain, gait problem and myalgias.  Skin: Negative.   Allergic/Immunologic: Negative.   Neurological:  Positive for numbness.       Tingling  Hematological: Negative.   Psychiatric/Behavioral:  Positive for dysphoric mood.   All other systems reviewed and are  negative.      Objective:   Physical Exam Vitals and nursing note reviewed.  Constitutional:      Appearance: Normal appearance. She is obese.  Neck:     Comments: Cervical Paraspinal Tenderness: C-5-C-6  Cardiovascular:     Rate and Rhythm: Normal rate and regular rhythm.     Pulses: Normal pulses.     Heart sounds: Normal heart sounds.  Pulmonary:     Effort: Pulmonary effort is normal.     Breath sounds: Normal breath sounds.  Musculoskeletal:     Cervical back: Normal range of motion and neck supple.     Comments: Normal Muscle Bulk and Muscle Testing Reveals:  Upper Extremities: Full ROM and Muscle Strength 5/5 Bilateral AC Joint Tenderness  Thoracic and Lumbar Hypersensitivity Bilateral Greater Trochanter Tenderness Lower Extremities: Full ROM and Muscle Strength 5/5 Arises from Table slowly using walker for support Narrow Based  Gait     Skin:    General: Skin is warm and dry.  Neurological:     Mental Status: She is alert and oriented to person, place, and time.  Psychiatric:        Mood and Affect: Mood normal.        Behavior: Behavior normal.         Assessment & Plan:  1.Lumbar pain lumbar spondylosis:  Encouraged to continue to increase Activity as tolerated.  10/06/2022 Continue current medication regimen. Refilled Oxycodone 10/325 mg one tablet every 6 hours as needed for pain. #120.  We will continue the opioid monitoring program, this consists of regular clinic visits, examinations, urine drug screen, pill counts as well as use of [place] Controlled Substance Reporting system. A 12 month History has been reviewed on the [place] Controlled Substance Reporting System on 10/06/2022.  2. Lumbar Radiculitis: Continue current medication regimen with Gabapentin. 10/06/2022 3. Bilateral Knee Pain/ Degenerative: Continue Voltaren Gel. Continue to Monitor. 10/06/2022 4. Severe diabetic poly neuropathy: Continue current medication regimen with  Gabapentin. 10/06/2022 5. Insomnia: No complaints today. Continue to Monitor.  10/06/2022 6.Anxiety/Depression: Continue Current Medication regimen as prescribed by PCP and   Psychiatry. 10/06/2022 7. De Quervains Tenosynovitis: No Complaints Today. 10/06/2022 8. Bilateral  Greater Trochanteric Bursitis: Continue with Ice and Heat Therapy. Continue to Monitor. 10/06/2022.  9. Cervicalgia/ Cervical Radiculitis:Continue Gabapentin. Continue with HEP as Tolerated and continue to monitor. 10/06/2022 10. Chronic Bilateral Thoracic Back Pain: Continue HEP as Tolerated. Continue current medication regimen. Continue to monitor. 10/06/2022.     F/U in 1 month

## 2022-10-07 ENCOUNTER — Other Ambulatory Visit: Payer: Self-pay | Admitting: Cardiology

## 2022-10-09 ENCOUNTER — Other Ambulatory Visit: Payer: Self-pay | Admitting: Nurse Practitioner

## 2022-10-17 ENCOUNTER — Other Ambulatory Visit: Payer: Self-pay | Admitting: "Endocrinology

## 2022-10-28 ENCOUNTER — Institutional Professional Consult (permissible substitution): Payer: 59 | Admitting: Pulmonary Disease

## 2022-10-31 ENCOUNTER — Encounter: Payer: 59 | Admitting: Registered Nurse

## 2022-11-01 ENCOUNTER — Other Ambulatory Visit: Payer: Self-pay | Admitting: "Endocrinology

## 2022-11-04 ENCOUNTER — Telehealth: Payer: Self-pay | Admitting: Nurse Practitioner

## 2022-11-04 ENCOUNTER — Telehealth: Payer: Self-pay

## 2022-11-04 ENCOUNTER — Encounter: Payer: 59 | Attending: Registered Nurse | Admitting: Registered Nurse

## 2022-11-04 ENCOUNTER — Encounter: Payer: Self-pay | Admitting: Registered Nurse

## 2022-11-04 VITALS — BP 109/68 | HR 86 | Ht 64.0 in | Wt 237.0 lb

## 2022-11-04 DIAGNOSIS — M7062 Trochanteric bursitis, left hip: Secondary | ICD-10-CM | POA: Insufficient documentation

## 2022-11-04 DIAGNOSIS — M48062 Spinal stenosis, lumbar region with neurogenic claudication: Secondary | ICD-10-CM

## 2022-11-04 DIAGNOSIS — Z79891 Long term (current) use of opiate analgesic: Secondary | ICD-10-CM

## 2022-11-04 DIAGNOSIS — M7061 Trochanteric bursitis, right hip: Secondary | ICD-10-CM | POA: Diagnosis not present

## 2022-11-04 DIAGNOSIS — Z5181 Encounter for therapeutic drug level monitoring: Secondary | ICD-10-CM | POA: Diagnosis not present

## 2022-11-04 DIAGNOSIS — M1712 Unilateral primary osteoarthritis, left knee: Secondary | ICD-10-CM

## 2022-11-04 DIAGNOSIS — G894 Chronic pain syndrome: Secondary | ICD-10-CM | POA: Diagnosis not present

## 2022-11-04 DIAGNOSIS — M1711 Unilateral primary osteoarthritis, right knee: Secondary | ICD-10-CM | POA: Diagnosis not present

## 2022-11-04 DIAGNOSIS — M5416 Radiculopathy, lumbar region: Secondary | ICD-10-CM | POA: Diagnosis not present

## 2022-11-04 MED ORDER — OXYCODONE-ACETAMINOPHEN 10-325 MG PO TABS: 1.0000 | ORAL_TABLET | Freq: Four times a day (QID) | ORAL | 0 refills | Status: DC | PRN

## 2022-11-04 MED ORDER — SEMAGLUTIDE(0.25 OR 0.5MG/DOS) 2 MG/3ML ~~LOC~~ SOPN: 0.2500 mg | PEN_INJECTOR | SUBCUTANEOUS | 1 refills | Status: DC

## 2022-11-04 NOTE — Progress Notes (Signed)
Subjective:    Patient ID: [name], female    DOB: 08-30-1950, 72 y.o.   MRN: [id number]  HPI: [name] is a 72 y.o. female who returns for follow up appointment for chronic pain and medication refill. She states her pain is located in her lower back radiating into her bilateral lower extremities, bilateral hip pain and bilateral knee pain. She rates her pain 8.Her  current exercise regime is walking and performing stretching exercises.   [name] Morphine equivalent is 60.00 MME.  She is also prescribed Alprazolam  by  Denton Meek We have discussed the black box warning of using opioids and benzodiazepines. I highlighted the dangers of using these drugs together and discussed the adverse events including respiratory suppression, overdose, cognitive impairment and importance of compliance with current regimen. We will continue to monitor and adjust as indicated.     Last Oral Swab was Performed on 07/03/2022, it was consistent.     Pain Inventory Average Pain 8 Pain Right Now 8 My pain is constant, sharp, burning, dull, stabbing, tingling, and aching  In the last 24 hours, has pain interfered with the following? General activity 8 Relation with others 8 Enjoyment of life 8 What TIME of day is your pain at its worst? morning  and night Sleep (in general) Fair  Pain is worse with: walking, bending, standing, and some activites Pain improves with: rest and medication heat Relief from Meds: 8  Family History  Problem Relation Age of Onset   Heart attack Mother    Colon cancer Mother    Heart attack Father    Stroke Sister    Hemophilia Child    Cancer Other    Coronary artery disease Other    Cancer Brother    Cancer Maternal Aunt    Cancer Maternal Uncle    Cancer Paternal Aunt    Cancer Paternal Uncle    Cancer Brother    Social History   Socioeconomic History   Marital status: Divorced    Spouse name: Not on file   Number of children: 4   Years  of education: Not on file   Highest education level: Not on file  Occupational History   Occupation: Disabled  Tobacco Use   Smoking status: Never   Smokeless tobacco: Never  Vaping Use   Vaping status: Never Used  Substance and Sexual Activity   Alcohol use: No    Alcohol/week: 0.0 standard drinks of alcohol   Drug use: No   Sexual activity: Not on file  Other Topics Concern   Not on file  Social History Narrative   Patient does not get regular exercise   Denies caffeine use    Social Determinants of Health   Financial Resource Strain: Low Risk  (07/21/2022)   Overall Financial Resource Strain (CARDIA)    Difficulty of Paying Living Expenses: Not very hard  Food Insecurity: No Food Insecurity (07/21/2022)   Hunger Vital Sign    Worried About Running Out of Food in the Last Year: Never true    Ran Out of Food in the Last Year: Never true  Transportation Needs: No Transportation Needs (07/21/2022)   PRAPARE - Administrator, Civil Service (Medical): No    Lack of Transportation (Non-Medical): No  Physical Activity: Inactive (07/21/2022)   Exercise Vital Sign    Days of Exercise per Week: 0 days    Minutes of Exercise per Session: 0 min  Stress: Stress  Concern Present (10/18/2018)   Received from [facility], [facility]   [facility] of Occupational Health - Occupational Stress Questionnaire    Feeling of Stress : To some extent  Social Connections: Moderately Integrated (10/18/2018)   Received from [facility], [facility]   Social Connection and Isolation Panel [NHANES]    Frequency of Communication with Friends and Family: More than three times a week    Frequency of Social Gatherings with Friends and Family: More than three times a week    Attends Religious Services: More than 4 times per year    Active Member of [employer] or Organizations: No    Attends Banker Meetings: Never    Marital Status: Living with partner   Past Surgical  History:  Procedure Laterality Date   ABDOMINAL HYSTERECTOMY     Arm Surgery Bilateral    due to fall-pt broke both forearms   BIOPSY N/A 07/01/2013   Procedure: BIOPSY;  Surgeon: Malissa Hippo, MD;  Location: [facility];  Service: Endoscopy;  Laterality: N/A;   CHOLECYSTECTOMY     COLONOSCOPY  08/06/2011   Procedure: COLONOSCOPY;  Surgeon: Malissa Hippo, MD;  [facility];  Service: Endoscopy;  Laterality: N/A;  215   COLONOSCOPY WITH PROPOFOL N/A 12/31/2012   Procedure: COLONOSCOPY WITH PROPOFOL;  Surgeon: Malissa Hippo, MD;  Location: [facility];  Service: Endoscopy;  Laterality: N/A;  in cecum at 0814, total withdrawal time   COLONOSCOPY WITH PROPOFOL N/A 02/05/2018   Procedure: COLONOSCOPY WITH PROPOFOL;  Surgeon: Malissa Hippo, MD;  [facility];  Service: Endoscopy;  Laterality: N/A;  1:25   CORONARY ANGIOPLASTY WITH STENT PLACEMENT     CYSTOSCOPY W/ URETERAL STENT PLACEMENT Right 11/10/2019   at [facility]   CYSTOSCOPY WITH RETROGRADE PYELOGRAM, URETEROSCOPY AND STENT PLACEMENT Right 12/22/2019   Procedure: CYSTOSCOPY WITH RIGHT URETERAL STENT REMOVAL; RIGHT RETROGRADE PYELOGRAM,RIGHT URETEROSCOPY;  Surgeon: Malen Gauze, MD;  Location: [facility];  Service: Urology;  Laterality: Right;   ESOPHAGEAL DILATION N/A 06/07/2015   Procedure: ESOPHAGEAL DILATION;  Surgeon: Malissa Hippo, MD;  [facility];  Service: Endoscopy;  Laterality: N/A;   ESOPHAGOGASTRODUODENOSCOPY N/A 06/07/2015   Procedure: ESOPHAGOGASTRODUODENOSCOPY (EGD);  Surgeon: Malissa Hippo, MD;  [facility];  Service: Endoscopy;  Laterality: N/A;  2:45 - moved to 1:00 - Ann to notify pt   ESOPHAGOGASTRODUODENOSCOPY (EGD) WITH PROPOFOL N/A 12/31/2012   Procedure: ESOPHAGOGASTRODUODENOSCOPY (EGD) WITH PROPOFOL;  Surgeon: Malissa Hippo, MD;  [facility]: [facility];  Service: Endoscopy;  Laterality: N/A;  GE junction 37,   ESOPHAGOGASTRODUODENOSCOPY (EGD) WITH PROPOFOL N/A 07/01/2013    Procedure: ESOPHAGOGASTRODUODENOSCOPY (EGD) WITH PROPOFOL;  Surgeon: Malissa Hippo, MD;  [facility]: [facility];  Service: Endoscopy;  Laterality: N/A;  950   MALONEY DILATION N/A 12/31/2012   Procedure: MALONEY DILATION;  Surgeon: Malissa Hippo, MD;  [facility]: [facility];  Service: Endoscopy;  Laterality: N/A;  used a # 54,#56   MALONEY DILATION N/A 07/01/2013   Procedure: MALONEY DILATION;  Surgeon: Malissa Hippo, MD;  [facility]: [facility];  Service: Endoscopy;  Laterality: N/A;  54/58; no heme present   POLYPECTOMY  02/05/2018   Procedure: POLYPECTOMY;  Surgeon: Malissa Hippo, MD;  [facility];  Service: Endoscopy;;  distal rectum (HS)   STONE EXTRACTION WITH BASKET Right 12/22/2019   Procedure: STONE EXTRACTION WITH BASKET;  Surgeon: Malen Gauze, MD;  [facility];  Service: Urology;  Laterality: Right;   Past Surgical History:  Procedure Laterality Date   ABDOMINAL HYSTERECTOMY     Arm Surgery Bilateral    due to fall-pt broke both forearms   BIOPSY N/A 07/01/2013   Procedure: BIOPSY;  Surgeon: Malissa Hippo, MD;  [facility];  Service: Endoscopy;  Laterality: N/A;   CHOLECYSTECTOMY     COLONOSCOPY  08/06/2011   Procedure: COLONOSCOPY;  Surgeon: Malissa Hippo, MD;  [facility];  Service: Endoscopy;  Laterality: N/A;  215   COLONOSCOPY WITH PROPOFOL N/A 12/31/2012   Procedure: COLONOSCOPY WITH PROPOFOL;  Surgeon: Malissa Hippo, MD;  [facility]: [facility];  Service: Endoscopy;  Laterality: N/A;  in cecum at 0814, total withdrawal time   COLONOSCOPY WITH PROPOFOL N/A 02/05/2018   Procedure: COLONOSCOPY WITH PROPOFOL;  Surgeon: Malissa Hippo, MD;  [facility];  Service: Endoscopy;  Laterality: N/A;  1:25   CORONARY ANGIOPLASTY WITH STENT PLACEMENT     CYSTOSCOPY W/ URETERAL STENT PLACEMENT Right 11/10/2019   at [facility]   CYSTOSCOPY WITH RETROGRADE PYELOGRAM, URETEROSCOPY AND STENT PLACEMENT Right 12/22/2019   Procedure: CYSTOSCOPY WITH  RIGHT URETERAL STENT REMOVAL; RIGHT RETROGRADE PYELOGRAM,RIGHT URETEROSCOPY;  Surgeon: Malen Gauze, MD;  [facility]: [facility];  Service: Urology;  Laterality: Right;   ESOPHAGEAL DILATION N/A 06/07/2015   Procedure: ESOPHAGEAL DILATION;  Surgeon: Malissa Hippo, MD;  [facility];  Service: Endoscopy;  Laterality: N/A;   ESOPHAGOGASTRODUODENOSCOPY N/A 06/07/2015   Procedure: ESOPHAGOGASTRODUODENOSCOPY (EGD);  Surgeon: Malissa Hippo, MD;  [facility];  Service: Endoscopy;  Laterality: N/A;  2:45 - moved to 1:00 - Ann to notify pt   ESOPHAGOGASTRODUODENOSCOPY (EGD) WITH PROPOFOL N/A 12/31/2012   Procedure: ESOPHAGOGASTRODUODENOSCOPY (EGD) WITH PROPOFOL;  Surgeon: Malissa Hippo, MD;  [facility];  Service: Endoscopy;  Laterality: N/A;  GE junction 37,   ESOPHAGOGASTRODUODENOSCOPY (EGD) WITH PROPOFOL N/A 07/01/2013   Procedure: ESOPHAGOGASTRODUODENOSCOPY (EGD) WITH PROPOFOL;  Surgeon: Malissa Hippo, MD;  [facility]: [facility];  Service: Endoscopy;  Laterality: N/A;  950   MALONEY DILATION N/A 12/31/2012   Procedure: MALONEY DILATION;  Surgeon: Malissa Hippo, MD;  [facility]: [facility];  Service: Endoscopy;  Laterality: N/A;  used a # 54,#56   MALONEY DILATION N/A 07/01/2013   Procedure: MALONEY DILATION;  Surgeon: Malissa Hippo, MD;  [facility];  Service: Endoscopy;  Laterality: N/A;  54/58; no heme present   POLYPECTOMY  02/05/2018   Procedure: POLYPECTOMY;  Surgeon: Malissa Hippo, MD;  [facility];  Service: Endoscopy;;  distal rectum (HS)   STONE EXTRACTION WITH BASKET Right 12/22/2019   Procedure: STONE EXTRACTION WITH BASKET;  Surgeon: Malen Gauze, MD;  Location: [facility];  Service: Urology;  Laterality: Right;   Past Medical History:  Diagnosis Date   Anxiety and depression    Arthritis    CAD (coronary artery disease)    Stent placement circumflex coronary 2007, catheterization 2008 patent stents. 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  Opioid Risk Score:   Fall Risk Score:  `1  Depression screen [facility] 2/9     10/06/2022    9:48 AM 08/26/2022    1:53 PM 07/28/2022   11:51 AM 10/17/2021    1:56 PM 08/09/2021    1:00 PM 05/16/2021    1:15 PM 10/11/2020    1:04 PM  Depression screen PHQ 2/9  Decreased Interest 1 1 1 3 1  0 1  Down, Depressed, Hopeless 1 1 1 3 1 3    PHQ - 2 Score 2 2 2 6 2 3 1   Altered sleeping    0     Tired, decreased energy    1     Change in appetite    0     Feeling bad or failure about yourself     0     Trouble concentrating    1     Moving slowly or fidgety/restless    0     Suicidal thoughts    0     PHQ-9 Score    8     Difficult doing work/chores    Not difficult at all       Review of Systems  Musculoskeletal:  Positive for back pain and gait problem.       Pain both legs from the hip down to toes  All other systems reviewed and are negative.      Objective:   Physical Exam Vitals and nursing note reviewed.  Constitutional:      Appearance: Normal appearance.  Cardiovascular:     Rate and Rhythm: Normal rate and regular rhythm.     Pulses: Normal pulses.     Heart sounds: Normal heart sounds.  Pulmonary:     Effort: Pulmonary effort is normal.     Breath sounds: Normal breath sounds.  Musculoskeletal:     Cervical back: Normal range of motion and neck supple.      Comments: Normal Muscle Bulk and Muscle Testing Reveals:  Upper Extremities: Full ROM and Muscle Strength 5/5 Right AC Joint Tenderness  Lumbar Paraspinal Tenderness: L-3-L-5 Bilateral Greater Trochanter Tenderness Lower Extremities: Full ROM and Muscle Strength 5/5 Arises from Table slowly using walker for support Antalgic  Gait     Skin:    General: Skin is warm and dry.  Neurological:     Mental Status: She is alert and oriented to person, place, and time.  Psychiatric:        Mood and Affect: Mood normal.        Behavior: Behavior normal.         Assessment & Plan:  1.Lumbar pain lumbar spondylosis:  Encouraged to continue to increase Activity as tolerated. 11/04/2022 Continue current medication regimen. Refilled Oxycodone 10/325 mg one tablet every 6 hours as needed for pain. #120.  We will continue the opioid monitoring program, this consists of regular clinic visits, examinations, urine drug screen, pill counts as well as use of [place] Controlled Substance Reporting system. A 12 month History has been reviewed on the [place] Controlled Substance Reporting System on  11/04/2022.  2. Lumbar Radiculitis: Continue current medication regimen with Gabapentin. 11/04/2022 3. Bilateral Knee Pain/ Degenerative: Continue Voltaren Gel. Continue to Monitor. 11/04/2022 4. Severe diabetic poly neuropathy: Continue current medication regimen with Gabapentin. 11/04/2022 5. Insomnia: No complaints today. Continue to Monitor.  11/04/2022 6.Anxiety/Depression: Continue Current Medication regimen as prescribed by PCP and   Psychiatry. 11/04/2022 7. De Quervains Tenosynovitis: No Complaints Today. 11/04/2022 8. Bilateral  Greater Trochanteric Bursitis: Continue with Ice and Heat Therapy. Continue to Monitor. 11/04/2022.  9. Cervicalgia/ Cervical Radiculitis:Continue Gabapentin. Continue with HEP as Tolerated and continue to monitor. 11/04/2022 10. Chronic Bilateral Thoracic Back Pain:  Continue HEP as Tolerated. Continue current medication regimen. Continue to monitor. 11/04/2022.     F/U in 1 month

## 2022-11-04 NOTE — Telephone Encounter (Signed)
Refill completed.

## 2022-11-04 NOTE — Telephone Encounter (Signed)
Tried to call pt but did not receive an answer and was unable to leave a message. 

## 2022-11-04 NOTE — Telephone Encounter (Signed)
*  STAT* If patient is at the pharmacy, call can be transferred to refill team.   1. Which medications need to be refilled? (please list name of each medication and dose if known) Semaglutide,0.25 or 0.5MG /DOS, 2 MG/3ML SOPN      4. Which pharmacy/location (including street and city if local pharmacy) is medication to be sent to? [facility] - [place], [place] - [address]     5. Do they need a 30 day or 90 day supply?  90

## 2022-11-11 ENCOUNTER — Encounter: Payer: Self-pay | Admitting: Registered Nurse

## 2022-11-19 ENCOUNTER — Other Ambulatory Visit: Payer: Self-pay | Admitting: "Endocrinology

## 2022-11-27 ENCOUNTER — Institutional Professional Consult (permissible substitution): Payer: 59 | Admitting: Primary Care

## 2022-12-01 ENCOUNTER — Encounter: Payer: Self-pay | Admitting: Primary Care

## 2022-12-01 ENCOUNTER — Encounter: Payer: 59 | Admitting: Registered Nurse

## 2022-12-02 DIAGNOSIS — R5383 Other fatigue: Secondary | ICD-10-CM | POA: Diagnosis not present

## 2022-12-02 DIAGNOSIS — I4891 Unspecified atrial fibrillation: Secondary | ICD-10-CM | POA: Diagnosis not present

## 2022-12-02 DIAGNOSIS — D6869 Other thrombophilia: Secondary | ICD-10-CM | POA: Diagnosis not present

## 2022-12-02 DIAGNOSIS — Z7189 Other specified counseling: Secondary | ICD-10-CM | POA: Diagnosis not present

## 2022-12-02 DIAGNOSIS — E7800 Pure hypercholesterolemia, unspecified: Secondary | ICD-10-CM | POA: Diagnosis not present

## 2022-12-02 DIAGNOSIS — Z299 Encounter for prophylactic measures, unspecified: Secondary | ICD-10-CM | POA: Diagnosis not present

## 2022-12-02 DIAGNOSIS — I10 Essential (primary) hypertension: Secondary | ICD-10-CM | POA: Diagnosis not present

## 2022-12-02 DIAGNOSIS — Z79899 Other long term (current) drug therapy: Secondary | ICD-10-CM | POA: Diagnosis not present

## 2022-12-02 DIAGNOSIS — E1165 Type 2 diabetes mellitus with hyperglycemia: Secondary | ICD-10-CM | POA: Diagnosis not present

## 2022-12-02 DIAGNOSIS — Z0000 Encounter for general adult medical examination without abnormal findings: Secondary | ICD-10-CM | POA: Diagnosis not present

## 2022-12-04 ENCOUNTER — Other Ambulatory Visit: Payer: Self-pay | Admitting: *Deleted

## 2022-12-04 ENCOUNTER — Encounter: Payer: Self-pay | Admitting: Registered Nurse

## 2022-12-04 ENCOUNTER — Encounter: Payer: 59 | Attending: Registered Nurse | Admitting: Registered Nurse

## 2022-12-04 VITALS — BP 109/69 | HR 85 | Ht 64.0 in | Wt 236.0 lb

## 2022-12-04 DIAGNOSIS — M25511 Pain in right shoulder: Secondary | ICD-10-CM | POA: Diagnosis not present

## 2022-12-04 DIAGNOSIS — M5412 Radiculopathy, cervical region: Secondary | ICD-10-CM | POA: Insufficient documentation

## 2022-12-04 DIAGNOSIS — M1712 Unilateral primary osteoarthritis, left knee: Secondary | ICD-10-CM | POA: Diagnosis not present

## 2022-12-04 DIAGNOSIS — M7061 Trochanteric bursitis, right hip: Secondary | ICD-10-CM | POA: Insufficient documentation

## 2022-12-04 DIAGNOSIS — M1711 Unilateral primary osteoarthritis, right knee: Secondary | ICD-10-CM | POA: Insufficient documentation

## 2022-12-04 DIAGNOSIS — M25512 Pain in left shoulder: Secondary | ICD-10-CM | POA: Diagnosis not present

## 2022-12-04 DIAGNOSIS — M542 Cervicalgia: Secondary | ICD-10-CM | POA: Diagnosis not present

## 2022-12-04 DIAGNOSIS — Z79891 Long term (current) use of opiate analgesic: Secondary | ICD-10-CM | POA: Diagnosis not present

## 2022-12-04 DIAGNOSIS — M546 Pain in thoracic spine: Secondary | ICD-10-CM | POA: Insufficient documentation

## 2022-12-04 DIAGNOSIS — G894 Chronic pain syndrome: Secondary | ICD-10-CM | POA: Diagnosis not present

## 2022-12-04 DIAGNOSIS — M48062 Spinal stenosis, lumbar region with neurogenic claudication: Secondary | ICD-10-CM | POA: Insufficient documentation

## 2022-12-04 DIAGNOSIS — G8929 Other chronic pain: Secondary | ICD-10-CM | POA: Insufficient documentation

## 2022-12-04 DIAGNOSIS — M5416 Radiculopathy, lumbar region: Secondary | ICD-10-CM | POA: Diagnosis not present

## 2022-12-04 DIAGNOSIS — Z5181 Encounter for therapeutic drug level monitoring: Secondary | ICD-10-CM | POA: Diagnosis not present

## 2022-12-04 DIAGNOSIS — M7062 Trochanteric bursitis, left hip: Secondary | ICD-10-CM | POA: Insufficient documentation

## 2022-12-04 MED ORDER — OXYCODONE-ACETAMINOPHEN 10-325 MG PO TABS: 1.0000 | ORAL_TABLET | Freq: Four times a day (QID) | ORAL | 0 refills | Status: DC | PRN

## 2022-12-04 NOTE — Progress Notes (Signed)
Subjective:    Patient ID: [name], female    DOB: Jan 22, 1951, 72 y.o.   MRN: [id number]  HPI: [name] is a 72 y.o. female who returns for follow up appointment for chronic pain and medication refill. She states her pain is located in her neck radiating into her bilateral shoulders mid- lower back pain radiating into her bilateral hips and bilateral lower extremities. Also reports bilateral knee pain and bilateral feet with tingling and burning.. She rates her pain 4. Her current exercise regime is walking and performing stretching exercises.  Ms. Mai Morphine equivalent is 60.00.00 MME. She is also prescribed Alprazolam  by Denton Meek .We have discussed the black box warning of using opioids and benzodiazepines. I highlighted the dangers of using these drugs together and discussed the adverse events including respiratory suppression, overdose, cognitive impairment and importance of compliance with current regimen. We will continue to monitor and adjust as indicated.    Oral Swab was Performed today.   Vitals: 109/69  P 85 96%  Pain Inventory Average Pain 5 Pain Right Now 4 My pain is constant, sharp, burning, dull, and stabbing  In the last 24 hours, has pain interfered with the following? General activity 4 Relation with others 1 Enjoyment of life 2 What TIME of day is your pain at its worst? morning  and night Sleep (in general) Poor  Pain is worse with: walking, bending, and standing Pain improves with: rest and medication Relief from Meds: 4  Family History  Problem Relation Age of Onset   Heart attack Mother    Colon cancer Mother    Heart attack Father    Stroke Sister    Hemophilia Child    Cancer Other    Coronary artery disease Other    Cancer Brother    Cancer Maternal Aunt    Cancer Maternal Uncle    Cancer Paternal Aunt    Cancer Paternal Uncle    Cancer Brother    Social History   Socioeconomic History   Marital status: Divorced     Spouse name: Not on file   Number of children: 4   Years of education: Not on file   Highest education level: Not on file  Occupational History   Occupation: Disabled  Tobacco Use   Smoking status: Never   Smokeless tobacco: Never  Vaping Use   Vaping status: Never Used  Substance and Sexual Activity   Alcohol use: No    Alcohol/week: 0.0 standard drinks of alcohol   Drug use: No   Sexual activity: Not on file  Other Topics Concern   Not on file  Social History Narrative   Patient does not get regular exercise   Denies caffeine use    Social Determinants of Health   Financial Resource Strain: Low Risk  (07/21/2022)   Overall Financial Resource Strain (CARDIA)    Difficulty of Paying Living Expenses: Not very hard  Food Insecurity: No Food Insecurity (07/21/2022)   Hunger Vital Sign    Worried About Running Out of Food in the Last Year: Never true    Ran Out of Food in the Last Year: Never true  Transportation Needs: No Transportation Needs (07/21/2022)   PRAPARE - Administrator, Civil Service (Medical): No    Lack of Transportation (Non-Medical): No  Physical Activity: Inactive (07/21/2022)   Exercise Vital Sign    Days of Exercise per Week: 0 days    Minutes of Exercise  per Session: 0 min  Stress: Stress Concern Present (10/18/2018)   [facility], [facility]   [facility] - Occupational Stress Questionnaire    Feeling of Stress : To some extent  Social Connections: Moderately Integrated (10/18/2018)   Received from [facility], [facility]   Social Connection and Isolation Panel [NHANES]    Frequency of Communication with Friends and Family: More than three times a week    Frequency of Social Gatherings with Friends and Family: More than three times a week    Attends Religious Services: More than 4 times per year    Active Member of [employer] or Organizations: No    Attends Banker Meetings: Never     Marital Status: Living with partner   Past Surgical History:  Procedure Laterality Date   ABDOMINAL HYSTERECTOMY     Arm Surgery Bilateral    due to fall-pt broke both forearms   BIOPSY N/A 07/01/2013   Procedure: BIOPSY;  Surgeon: Malissa Hippo, MD;  Location: [facility];  Service: Endoscopy;  Laterality: N/A;   CHOLECYSTECTOMY     COLONOSCOPY  08/06/2011   Procedure: COLONOSCOPY;  Surgeon: Malissa Hippo, MD;  [facility];  Service: Endoscopy;  Laterality: N/A;  215   COLONOSCOPY WITH PROPOFOL N/A 12/31/2012   Procedure: COLONOSCOPY WITH PROPOFOL;  Surgeon: Malissa Hippo, MD;  Location: [facility];  Service: Endoscopy;  Laterality: N/A;  in cecum at 0814, total withdrawal time   COLONOSCOPY WITH PROPOFOL N/A 02/05/2018   Procedure: COLONOSCOPY WITH PROPOFOL;  Surgeon: Malissa Hippo, MD;  [facility];  Service: Endoscopy;  Laterality: N/A;  1:25   CORONARY ANGIOPLASTY WITH STENT PLACEMENT     CYSTOSCOPY W/ URETERAL STENT PLACEMENT Right 11/10/2019   at [facility]   CYSTOSCOPY WITH RETROGRADE PYELOGRAM, URETEROSCOPY AND STENT PLACEMENT Right 12/22/2019   Procedure: CYSTOSCOPY WITH RIGHT URETERAL STENT REMOVAL; RIGHT RETROGRADE PYELOGRAM,RIGHT URETEROSCOPY;  Surgeon: Malen Gauze, MD;  Location: [facility];  Service: Urology;  Laterality: Right;   ESOPHAGEAL DILATION N/A 06/07/2015   Procedure: ESOPHAGEAL DILATION;  Surgeon: Malissa Hippo, MD;  [facility];  Service: Endoscopy;  Laterality: N/A;   ESOPHAGOGASTRODUODENOSCOPY N/A 06/07/2015   Procedure: ESOPHAGOGASTRODUODENOSCOPY (EGD);  Surgeon: Malissa Hippo, MD;  [facility];  Service: Endoscopy;  Laterality: N/A;  2:45 - moved to 1:00 - Ann to notify pt   ESOPHAGOGASTRODUODENOSCOPY (EGD) WITH PROPOFOL N/A 12/31/2012   Procedure: ESOPHAGOGASTRODUODENOSCOPY (EGD) WITH PROPOFOL;  Surgeon: Malissa Hippo, MD;  [facility]: [facility];  Service: Endoscopy;  Laterality: N/A;  GE junction 37,    ESOPHAGOGASTRODUODENOSCOPY (EGD) WITH PROPOFOL N/A 07/01/2013   Procedure: ESOPHAGOGASTRODUODENOSCOPY (EGD) WITH PROPOFOL;  Surgeon: Malissa Hippo, MD;  [facility]: [facility];  Service: Endoscopy;  Laterality: N/A;  950   MALONEY DILATION N/A 12/31/2012   Procedure: MALONEY DILATION;  Surgeon: Malissa Hippo, MD;  [facility]: [facility];  Service: Endoscopy;  Laterality: N/A;  used a # 54,#56   MALONEY DILATION N/A 07/01/2013   Procedure: MALONEY DILATION;  Surgeon: Malissa Hippo, MD;  [facility]: [facility];  Service: Endoscopy;  Laterality: N/A;  54/58; no heme present   POLYPECTOMY  02/05/2018   Procedure: POLYPECTOMY;  Surgeon: Malissa Hippo, MD;  [facility];  Service: Endoscopy;;  distal rectum (HS)   STONE EXTRACTION WITH BASKET Right 12/22/2019   Procedure: STONE EXTRACTION WITH BASKET;  Surgeon: Malen Gauze, MD;  [facility]: [facility];  Service: Urology;  Laterality: Right;   Past Surgical History:  Procedure Laterality Date   ABDOMINAL HYSTERECTOMY     Arm Surgery Bilateral    due to fall-pt broke both forearms   BIOPSY N/A 07/01/2013   Procedure: BIOPSY;  Surgeon: Malissa Hippo, MD;  [facility];  Service: Endoscopy;  Laterality: N/A;   CHOLECYSTECTOMY     COLONOSCOPY  08/06/2011   Procedure: COLONOSCOPY;  Surgeon: Malissa Hippo, MD;  [facility];  Service: Endoscopy;  Laterality: N/A;  215   COLONOSCOPY WITH PROPOFOL N/A 12/31/2012   Procedure: COLONOSCOPY WITH PROPOFOL;  Surgeon: Malissa Hippo, MD;  [facility]: [facility];  Service: Endoscopy;  Laterality: N/A;  in cecum at 0814, total withdrawal time   COLONOSCOPY WITH PROPOFOL N/A 02/05/2018   Procedure: COLONOSCOPY WITH PROPOFOL;  Surgeon: Malissa Hippo, MD;  [facility];  Service: Endoscopy;  Laterality: N/A;  1:25   CORONARY ANGIOPLASTY WITH STENT PLACEMENT     CYSTOSCOPY W/ URETERAL STENT PLACEMENT Right 11/10/2019   at [facility]   CYSTOSCOPY WITH RETROGRADE PYELOGRAM, URETEROSCOPY AND  STENT PLACEMENT Right 12/22/2019   Procedure: CYSTOSCOPY WITH RIGHT URETERAL STENT REMOVAL; RIGHT RETROGRADE PYELOGRAM,RIGHT URETEROSCOPY;  Surgeon: Malen Gauze, MD;  [facility]: [facility];  Service: Urology;  Laterality: Right;   ESOPHAGEAL DILATION N/A 06/07/2015   Procedure: ESOPHAGEAL DILATION;  Surgeon: Malissa Hippo, MD;  [facility];  Service: Endoscopy;  Laterality: N/A;   ESOPHAGOGASTRODUODENOSCOPY N/A 06/07/2015   Procedure: ESOPHAGOGASTRODUODENOSCOPY (EGD);  Surgeon: Malissa Hippo, MD;  [facility];  Service: Endoscopy;  Laterality: N/A;  2:45 - moved to 1:00 - Ann to notify pt   ESOPHAGOGASTRODUODENOSCOPY (EGD) WITH PROPOFOL N/A 12/31/2012   Procedure: ESOPHAGOGASTRODUODENOSCOPY (EGD) WITH PROPOFOL;  Surgeon: Malissa Hippo, MD;  [facility];  Service: Endoscopy;  Laterality: N/A;  GE junction 37,   ESOPHAGOGASTRODUODENOSCOPY (EGD) WITH PROPOFOL N/A 07/01/2013   Procedure: ESOPHAGOGASTRODUODENOSCOPY (EGD) WITH PROPOFOL;  Surgeon: Malissa Hippo, MD;  [facility]: [facility];  Service: Endoscopy;  Laterality: N/A;  950   MALONEY DILATION N/A 12/31/2012   Procedure: MALONEY DILATION;  Surgeon: Malissa Hippo, MD;  [facility];  Service: Endoscopy;  Laterality: N/A;  used a # 54,#56   MALONEY DILATION N/A 07/01/2013   Procedure: MALONEY DILATION;  Surgeon: Malissa Hippo, MD;  [facility];  Service: Endoscopy;  Laterality: N/A;  54/58; no heme present   POLYPECTOMY  02/05/2018   Procedure: POLYPECTOMY;  Surgeon: Malissa Hippo, MD;  [facility];  Service: Endoscopy;;  distal rectum (HS)   STONE EXTRACTION WITH BASKET Right 12/22/2019   Procedure: STONE EXTRACTION WITH BASKET;  Surgeon: Malen Gauze, MD;  [facility]: [facility];  Service: Urology;  Laterality: Right;   Past Medical History:  Diagnosis Date   Anxiety and depression    Arthritis    CAD (coronary artery disease)    Stent placement circumflex coronary 2007,  catheterization 2008 patent stents. 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    High cholesterol    Hx of blood clots    Hypertension    MI (myocardial infarction) (HCC)    2007   Obstructive sleep apnea    CPAP, setting ?2   Palpitations    Sleep apnea    Tachycardia    Ht 5\' 4"  (1.626 m)   Wt 236 lb (107 kg)   BMI 40.51 kg/m   Opioid Risk Score:   Fall Risk Score:  `1  Depression screen [facility] 2/9     11/04/2022   10:59 AM 10/06/2022    9:48 AM 08/26/2022    1:53 PM 07/28/2022   11:51 AM 10/17/2021    1:56 PM 08/09/2021    1:00 PM 05/16/2021    1:15 PM  Depression screen PHQ 2/9  Decreased Interest 0 1 1 1 3 1  0  Down, Depressed, Hopeless 0 1 1 1 3 1 3   PHQ - 2 Score 0 2 2 2 6 2 3   Altered sleeping     0    Tired, decreased energy     1    Change in appetite     0    Feeling bad or failure about yourself      0    Trouble concentrating     1    Moving slowly or fidgety/restless     0    Suicidal thoughts     0    PHQ-9 Score     8    Difficult doing work/chores     Not difficult at all        Review of Systems  Musculoskeletal:        B/L knee foot  hand RT leg   All other systems reviewed and are negative.      Objective:   Physical Exam Vitals and nursing note reviewed.  Constitutional:      Appearance: Normal appearance.  Neck:     Comments: Cervical Paraspinal Tenderness: C-5-C-6 Cardiovascular:     Rate and Rhythm: Normal rate and regular rhythm.     Pulses: Normal pulses.     Heart sounds: Normal heart sounds.  Pulmonary:     Effort: Pulmonary effort is normal.     Breath sounds: Normal  breath sounds.  Musculoskeletal:     Cervical back: Normal range of motion and neck supple.     Comments: Normal Muscle Bulk and Muscle Testing Reveals:  Upper Extremities: Full ROM and Muscle Strength 5/5 Bilateral AC Joint Tenderness  Thoracic and Lumbar Hypersensitivity Bilateral Greater Trochanter Tenderness Lower Extremities: Full ROM and Muscle Strength 5/5 Arises from Table slowly using walker for support Antalgic  Gait     Skin:    General: Skin is warm and dry.  Neurological:     Mental Status: She is alert and oriented to person, place, and time.  Psychiatric:        Mood and Affect: Mood normal.        Behavior: Behavior normal.         Assessment & Plan:  1.Lumbar pain lumbar spondylosis:  Encouraged to continue to increase Activity as tolerated. 12/04/2022 Continue current medication regimen. Refilled Oxycodone 10/325 mg one tablet every 6 hours as needed for pain. #120.  We will continue the opioid monitoring program, this consists of regular clinic visits, examinations, urine drug screen, pill counts as well as use of [address] Controlled  Substance Reporting system. A 12 month History has been reviewed on the [place] Controlled Substance Reporting System on 12/04/2022.  2. Lumbar Radiculitis: Continue current medication regimen with Gabapentin. 12/04/2022 3. Bilateral Knee Pain/ Degenerative: Continue Voltaren Gel. Continue to Monitor. 12/04/2022 4. Severe diabetic poly neuropathy: Continue current medication regimen with Gabapentin. 12/04/2022 5. Insomnia: No complaints today. Continue to Monitor.  12/04/2022 6.Anxiety/Depression: Continue Current Medication regimen as prescribed by PCP and   Psychiatry. 12/04/2022 7. De Quervains Tenosynovitis: No Complaints Today. 12/04/2022 8. Bilateral  Greater Trochanteric Bursitis: Continue with Ice and Heat Therapy. Continue to Monitor. 12/04/2022.  9. Cervicalgia/ Cervical Radiculitis:Continue Gabapentin.  Continue with HEP as Tolerated and continue to monitor. 12/04/2022 10. Chronic Bilateral Thoracic Back Pain: Continue HEP as Tolerated. Continue current medication regimen. Continue to monitor. 12/04/2022.     F/U in 1 month

## 2022-12-15 DIAGNOSIS — M159 Polyosteoarthritis, unspecified: Secondary | ICD-10-CM | POA: Diagnosis not present

## 2022-12-15 DIAGNOSIS — R0902 Hypoxemia: Secondary | ICD-10-CM | POA: Diagnosis not present

## 2022-12-15 DIAGNOSIS — G4733 Obstructive sleep apnea (adult) (pediatric): Secondary | ICD-10-CM | POA: Diagnosis not present

## 2022-12-15 DIAGNOSIS — I509 Heart failure, unspecified: Secondary | ICD-10-CM | POA: Diagnosis not present

## 2022-12-15 DIAGNOSIS — M797 Fibromyalgia: Secondary | ICD-10-CM | POA: Diagnosis not present

## 2022-12-30 ENCOUNTER — Encounter: Payer: 59 | Admitting: Registered Nurse

## 2023-01-01 ENCOUNTER — Encounter: Payer: Self-pay | Admitting: Registered Nurse

## 2023-01-01 ENCOUNTER — Encounter: Payer: 59 | Attending: Registered Nurse | Admitting: Registered Nurse

## 2023-01-01 VITALS — BP 112/70 | HR 81 | Ht 64.0 in | Wt 241.0 lb

## 2023-01-01 DIAGNOSIS — M1712 Unilateral primary osteoarthritis, left knee: Secondary | ICD-10-CM | POA: Diagnosis not present

## 2023-01-01 DIAGNOSIS — M7061 Trochanteric bursitis, right hip: Secondary | ICD-10-CM | POA: Insufficient documentation

## 2023-01-01 DIAGNOSIS — M7062 Trochanteric bursitis, left hip: Secondary | ICD-10-CM | POA: Insufficient documentation

## 2023-01-01 DIAGNOSIS — M25511 Pain in right shoulder: Secondary | ICD-10-CM | POA: Diagnosis not present

## 2023-01-01 DIAGNOSIS — Z79891 Long term (current) use of opiate analgesic: Secondary | ICD-10-CM | POA: Insufficient documentation

## 2023-01-01 DIAGNOSIS — M5412 Radiculopathy, cervical region: Secondary | ICD-10-CM | POA: Insufficient documentation

## 2023-01-01 DIAGNOSIS — G894 Chronic pain syndrome: Secondary | ICD-10-CM | POA: Diagnosis not present

## 2023-01-01 DIAGNOSIS — Z5181 Encounter for therapeutic drug level monitoring: Secondary | ICD-10-CM | POA: Insufficient documentation

## 2023-01-01 DIAGNOSIS — G8929 Other chronic pain: Secondary | ICD-10-CM | POA: Insufficient documentation

## 2023-01-01 DIAGNOSIS — M25512 Pain in left shoulder: Secondary | ICD-10-CM | POA: Diagnosis not present

## 2023-01-01 DIAGNOSIS — M48062 Spinal stenosis, lumbar region with neurogenic claudication: Secondary | ICD-10-CM | POA: Insufficient documentation

## 2023-01-01 DIAGNOSIS — M5416 Radiculopathy, lumbar region: Secondary | ICD-10-CM | POA: Insufficient documentation

## 2023-01-01 DIAGNOSIS — M1711 Unilateral primary osteoarthritis, right knee: Secondary | ICD-10-CM | POA: Diagnosis not present

## 2023-01-01 DIAGNOSIS — M546 Pain in thoracic spine: Secondary | ICD-10-CM | POA: Diagnosis not present

## 2023-01-01 DIAGNOSIS — M542 Cervicalgia: Secondary | ICD-10-CM | POA: Insufficient documentation

## 2023-01-01 MED ORDER — OXYCODONE-ACETAMINOPHEN 10-325 MG PO TABS: 1.0000 | ORAL_TABLET | Freq: Four times a day (QID) | ORAL | 0 refills | Status: DC | PRN

## 2023-01-01 NOTE — Progress Notes (Signed)
Subjective:    Patient ID: [name], female    DOB: [birth year], 72 y.o.   MRN: [id number]  HPI: [name] is a 72 y.o. female who returns for follow up appointment for chronic pain and medication refill. She states her pain is located in her neck radiating into her bilateral shoulders, mid- lower back pain radiating into her bilateral lower extremities, bilateral hip pain and bilateral knee pain. She rates her pain 5. His current exercise regime is walking with walker and performing stretching exercises.  Ms. Malmgren Morphine equivalent is 60.00 MME.      Pain Inventory Average Pain 7 Pain Right Now 5 My pain is sharp, burning, dull, stabbing, tingling, and aching  In the last 24 hours, has pain interfered with the following? General activity 4 Relation with others 4 Enjoyment of life 4 What TIME of day is your pain at its worst? morning  and night Sleep (in general) Fair  Pain is worse with: walking, bending, inactivity, and some activites Pain improves with: therapy/exercise, pacing activities, and medication Relief from Meds: 5  Family History  Problem Relation Age of Onset   Heart attack Mother    Colon cancer Mother    Heart attack Father    Stroke Sister    Hemophilia Child    Cancer Other    Coronary artery disease Other    Cancer Brother    Cancer Maternal Aunt    Cancer Maternal Uncle    Cancer Paternal Aunt    Cancer Paternal Uncle    Cancer Brother    Social History   Socioeconomic History   Marital status: Divorced    Spouse name: Not on file   Number of children: 4   Years of education: Not on file   Highest education level: Not on file  Occupational History   Occupation: Disabled  Tobacco Use   Smoking status: Never   Smokeless tobacco: Never  Vaping Use   Vaping status: Never Used  Substance and Sexual Activity   Alcohol use: No    Alcohol/week: 0.0 standard drinks of alcohol   Drug use: No   Sexual activity: Not on file  Other  Topics Concern   Not on file  Social History Narrative   Patient does not get regular exercise   Denies caffeine use    Social Determinants of Health   Financial Resource Strain: Low Risk  (07/21/2022)   Overall Financial Resource Strain (CARDIA)    Difficulty of Paying Living Expenses: Not very hard  Food Insecurity: No Food Insecurity (07/21/2022)   Hunger Vital Sign    Worried About Running Out of Food in the Last Year: Never true    Ran Out of Food in the Last Year: Never true  Transportation Needs: No Transportation Needs (07/21/2022)   PRAPARE - Administrator, Civil Service (Medical): No    Lack of Transportation (Non-Medical): No  Physical Activity: Inactive (07/21/2022)   Exercise Vital Sign    Days of Exercise per Week: 0 days    Minutes of Exercise per Session: 0 min  Stress: Stress Concern Present (10/18/2018)   Received from [facility] - Occupational Stress Questionnaire    Feeling of Stress : To some extent  Social Connections: Moderately Integrated (10/18/2018)   Received from [facility]   Social Connection and Isolation Panel [NHANES]    Frequency of Communication  with Friends and Family: More than three times a week    Frequency of Social Gatherings with Friends and Family: More than three times a week    Attends Religious Services: More than 4 times per year    Active Member of Clubs or Organizations: No    Attends Banker Meetings: Never    Marital Status: Living with partner   Past Surgical History:  Procedure Laterality Date   ABDOMINAL HYSTERECTOMY     Arm Surgery Bilateral    due to fall-pt broke both forearms   BIOPSY N/A 07/01/2013   Procedure: BIOPSY;  Surgeon: Malissa Hippo, MD;  Location: [facility];  Service: Endoscopy;  Laterality: N/A;   CHOLECYSTECTOMY     COLONOSCOPY  08/06/2011   Procedure: COLONOSCOPY;  Surgeon: Malissa Hippo, MD;  [facility];  Service: Endoscopy;  Laterality: N/A;  215   COLONOSCOPY WITH PROPOFOL N/A 12/31/2012   Procedure: COLONOSCOPY WITH PROPOFOL;  Surgeon: Malissa Hippo, MD;  Location: [facility];  Service: Endoscopy;  Laterality: N/A;  in cecum at 0814, total withdrawal time   COLONOSCOPY WITH PROPOFOL N/A 02/05/2018   Procedure: COLONOSCOPY WITH PROPOFOL;  Surgeon: Malissa Hippo, MD;  [facility];  Service: Endoscopy;  Laterality: N/A;  1:25   CORONARY ANGIOPLASTY WITH STENT PLACEMENT     CYSTOSCOPY W/ URETERAL STENT PLACEMENT Right 11/10/2019   at [facility]   CYSTOSCOPY WITH RETROGRADE PYELOGRAM, URETEROSCOPY AND STENT PLACEMENT Right 12/22/2019   Procedure: CYSTOSCOPY WITH RIGHT URETERAL STENT REMOVAL; RIGHT RETROGRADE PYELOGRAM,RIGHT URETEROSCOPY;  Surgeon: Malen Gauze, MD;  Location: [facility];  Service: Urology;  Laterality: Right;   ESOPHAGEAL DILATION N/A 06/07/2015   Procedure: ESOPHAGEAL DILATION;  Surgeon: Malissa Hippo, MD;  [facility];  Service: Endoscopy;  Laterality: N/A;   ESOPHAGOGASTRODUODENOSCOPY N/A 06/07/2015   Procedure: ESOPHAGOGASTRODUODENOSCOPY (EGD);  Surgeon: Malissa Hippo, MD;  [facility];  Service: Endoscopy;  Laterality: N/A;  2:45 - moved to 1:00 - Ann to notify pt   ESOPHAGOGASTRODUODENOSCOPY (EGD) WITH PROPOFOL N/A 12/31/2012   Procedure: ESOPHAGOGASTRODUODENOSCOPY (EGD) WITH PROPOFOL;  Surgeon: Malissa Hippo, MD;  [facility]: [facility];  Service: Endoscopy;  Laterality: N/A;  GE junction 37,   ESOPHAGOGASTRODUODENOSCOPY (EGD) WITH PROPOFOL N/A 07/01/2013   Procedure: ESOPHAGOGASTRODUODENOSCOPY (EGD) WITH PROPOFOL;  Surgeon: Malissa Hippo, MD;  [facility]: [facility];  Service: Endoscopy;  Laterality: N/A;  950   MALONEY DILATION N/A 12/31/2012   Procedure: MALONEY DILATION;  Surgeon: Malissa Hippo, MD;  [facility]: [facility];  Service: Endoscopy;  Laterality: N/A;  used a # 54,#56   MALONEY DILATION N/A 07/01/2013   Procedure:  MALONEY DILATION;  Surgeon: Malissa Hippo, MD;  [facility]: [facility];  Service: Endoscopy;  Laterality: N/A;  54/58; no heme present   POLYPECTOMY  02/05/2018   Procedure: POLYPECTOMY;  Surgeon: Malissa Hippo, MD;  [facility];  Service: Endoscopy;;  distal rectum (HS)   STONE EXTRACTION WITH BASKET Right 12/22/2019   Procedure: STONE EXTRACTION WITH BASKET;  Surgeon: Malen Gauze, MD;  [facility]: [facility];  Service: Urology;  Laterality: Right;   Past Surgical History:  Procedure Laterality Date   ABDOMINAL HYSTERECTOMY     Arm Surgery Bilateral    due to fall-pt broke both forearms   BIOPSY N/A 07/01/2013   Procedure: BIOPSY;  Surgeon: Malissa Hippo, MD;  [facility];  Service: Endoscopy;  Laterality: N/A;  CHOLECYSTECTOMY     COLONOSCOPY  08/06/2011   Procedure: COLONOSCOPY;  Surgeon: Malissa Hippo, MD;  [facility];  Service: Endoscopy;  Laterality: N/A;  215   COLONOSCOPY WITH PROPOFOL N/A 12/31/2012   Procedure: COLONOSCOPY WITH PROPOFOL;  Surgeon: Malissa Hippo, MD;  [facility]: [facility];  Service: Endoscopy;  Laterality: N/A;  in cecum at 0814, total withdrawal time   COLONOSCOPY WITH PROPOFOL N/A 02/05/2018   Procedure: COLONOSCOPY WITH PROPOFOL;  Surgeon: Malissa Hippo, MD;  [facility];  Service: Endoscopy;  Laterality: N/A;  1:25   CORONARY ANGIOPLASTY WITH STENT PLACEMENT     CYSTOSCOPY W/ URETERAL STENT PLACEMENT Right 11/10/2019   at [facility]   CYSTOSCOPY WITH RETROGRADE PYELOGRAM, URETEROSCOPY AND STENT PLACEMENT Right 12/22/2019   Procedure: CYSTOSCOPY WITH RIGHT URETERAL STENT REMOVAL; RIGHT RETROGRADE PYELOGRAM,RIGHT URETEROSCOPY;  Surgeon: Malen Gauze, MD;  [facility]: [facility];  Service: Urology;  Laterality: Right;   ESOPHAGEAL DILATION N/A 06/07/2015   Procedure: ESOPHAGEAL DILATION;  Surgeon: Malissa Hippo, MD;  [facility];  Service: Endoscopy;  Laterality: N/A;   ESOPHAGOGASTRODUODENOSCOPY N/A  06/07/2015   Procedure: ESOPHAGOGASTRODUODENOSCOPY (EGD);  Surgeon: Malissa Hippo, MD;  [facility];  Service: Endoscopy;  Laterality: N/A;  2:45 - moved to 1:00 - Ann to notify pt   ESOPHAGOGASTRODUODENOSCOPY (EGD) WITH PROPOFOL N/A 12/31/2012   Procedure: ESOPHAGOGASTRODUODENOSCOPY (EGD) WITH PROPOFOL;  Surgeon: Malissa Hippo, MD;  [facility]: [facility];  Service: Endoscopy;  Laterality: N/A;  GE junction 37,   ESOPHAGOGASTRODUODENOSCOPY (EGD) WITH PROPOFOL N/A 07/01/2013   Procedure: ESOPHAGOGASTRODUODENOSCOPY (EGD) WITH PROPOFOL;  Surgeon: Malissa Hippo, MD;  [facility]: [facility];  Service: Endoscopy;  Laterality: N/A;  950   MALONEY DILATION N/A 12/31/2012   Procedure: MALONEY DILATION;  Surgeon: Malissa Hippo, MD;  [facility]: [facility];  Service: Endoscopy;  Laterality: N/A;  used a # 54,#56   MALONEY DILATION N/A 07/01/2013   Procedure: MALONEY DILATION;  Surgeon: Malissa Hippo, MD;  [facility]: [facility];  Service: Endoscopy;  Laterality: N/A;  54/58; no heme present   POLYPECTOMY  02/05/2018   Procedure: POLYPECTOMY;  Surgeon: Malissa Hippo, MD;  [facility];  Service: Endoscopy;;  distal rectum (HS)   STONE EXTRACTION WITH BASKET Right 12/22/2019   Procedure: STONE EXTRACTION WITH BASKET;  Surgeon: Malen Gauze, MD;  [facility]: [facility];  Service: Urology;  Laterality: Right;   Past Medical History:  Diagnosis Date   Anxiety and depression    Arthritis    CAD (coronary artery disease)    Stent placement circumflex coronary 2007, catheterization 2008 patent stents. 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  Opioid Risk Score:   Fall Risk Score:  `1  Depression screen [facility] 2/9     12/04/2022    2:27 PM 11/04/2022   10:59 AM 10/06/2022    9:48 AM 08/26/2022    1:53 PM 07/28/2022   11:51 AM 10/17/2021    1:56 PM 08/09/2021    1:00 PM  Depression screen PHQ 2/9  Decreased Interest 1 0 1 1 1 3 1   Down, Depressed, Hopeless 1 0 1 1 1 3 1   PHQ - 2 Score 2 0 2 2 2 6 2   Altered sleeping      0   Tired, decreased energy      1   Change in appetite      0   Feeling bad or failure about yourself       0   Trouble concentrating      1   Moving slowly or fidgety/restless      0   Suicidal thoughts      0   PHQ-9 Score      8   Difficult doing work/chores      Not difficult at all       Review of Systems  Musculoskeletal:        B/L arm pain   All other systems reviewed and are negative.     Objective:   Physical Exam Vitals and nursing note reviewed.  Constitutional:      Appearance: Normal appearance. She is obese.  Neck:     Comments: Cervical Paraspinal Tenderness: C-5-C-6  Cardiovascular:     Rate and Rhythm: Normal rate and regular rhythm.     Pulses: Normal pulses.     Heart sounds:     No gallop.  Pulmonary:     Effort: Pulmonary effort is normal.     Breath sounds: Normal breath sounds.  Musculoskeletal:     Cervical back: Normal range of motion and neck supple.     Comments: Normal Muscle Bulk and Muscle Testing Reveals:  Upper Extremities: Full ROM and Muscle Strength 5/5  Bilateral AC Joint Tenderness   Thoracic,and  Lumbar Hypersensitivity Bilateral Greater Trochanter Tenderness Lower Extremities: Decreased ROM and Muscle Strength 5/5 Bilateral Lower Extremities Flexion Produces Pain into his Bilateral Lower Extremities and  Bilateral Feet  Arises from Table slowly using walker for support Antalgic Gait     Skin:    General: Skin is warm and dry.  Neurological:     Mental Status: She is alert and oriented to person, place, and time.  Psychiatric:        Mood and Affect: Mood normal.        Behavior: Behavior normal.         Assessment & Plan:  1.Lumbar pain lumbar spondylosis:  Encouraged to continue to increase Activity as tolerated. 01/01/2023 Continue current medication regimen. Refilled Oxycodone 10/325 mg one tablet every 6 hours as needed for pain. #120.  We will continue the opioid monitoring program, this consists of regular clinic visits, examinations, urine drug screen, pill counts as well as use of [place] Controlled Substance Reporting system. A 12 month History has been reviewed on the [place] Controlled Substance Reporting System on 01/01/2023.  2. Lumbar Radiculitis: Continue current medication regimen with Gabapentin. 01/01/2023 3. Bilateral Knee Pain/ Degenerative: Continue Voltaren Gel. Continue to Monitor. 01/01/2023 4. Severe diabetic poly neuropathy: Continue current medication regimen with Gabapentin. 01/01/2023 5. Insomnia: No complaints today. Continue  to Monitor.  01/01/2023 6.Anxiety/Depression: Continue Current Medication regimen as prescribed by PCP and   Psychiatry. 01/01/2023 7. De Quervains Tenosynovitis: No Complaints Today. 01/01/2023 8. Bilateral  Greater Trochanteric Bursitis: Continue with Ice and Heat Therapy. Continue to Monitor. 01/01/2023.  9. Cervicalgia/ Cervical Radiculitis:Continue Gabapentin. Continue with HEP as Tolerated and continue to monitor. 01/01/2023 10. Chronic Bilateral Thoracic Back Pain: Continue HEP as Tolerated. Continue current medication regimen. Continue to monitor. 01/01/2023.     F/U in 1 month

## 2023-01-02 ENCOUNTER — Other Ambulatory Visit: Payer: Self-pay | Admitting: Nurse Practitioner

## 2023-01-05 NOTE — Progress Notes (Deleted)
[facility] [address] [place]. [place] [address] [phone]       OFFICE FOLLOW UP NOTE  [name] Date of Birth:  [birth year] Medical Record Number:  [id number]    Primary neurologist: Dr. Pearlean Brownie Reason for visit: TIA, daily headaches   No chief complaint on file.      HPI:   Update 01/06/2023 JM: Patient returns for follow-up visit.  Patient was started on topiramate at prior visit for complaints of daily headaches, discussed limiting OTC pain relievers as likely causing rebound headaches. Reports headaches ***.   Followed by pain management for chronic pain syndrome, on gabapentin 300mg  TID for cervicalgia, lumbar radiculitis and diabetic polyneuropathy. Also on meloxicam, Robaxin and oxycodone  Denies new stroke/TIA symptoms.  Compliant on aspirin and atorvastatin.  Routinely follows with PCP for stroke risk factor management.        Consult visit 08/28/2022 Dr. Pearlean Brownie: [name] is a 72 year old Caucasian lady seen today for office consultation visit.  History is obtained from husband and review of electronic medical records and I personally reviewed pertinent available imaging films in PACS.  She has acomplex medical history that includes coronary artery disease, CHF, COPD, diabetes, obesity, sleep apnea, prior history of MI status post PCI with stent placement, anxiety, depression, she presented on 05/08/2022 for evaluation for left arm numbness and weakness along with slurred speech.  In the emergency room her blood pressure was significantly elevated at 220/140.  CT head was unremarkable.  Code stroke was called and neurohospitalist on evaluation found mild generalized weakness in all 4 extremities with subjective heaviness on the left side and some slurred speech.  Patient was not considered candidate for thrombolysis or thrombectomy.  CT scan as well as MRI scan of the brain were obtained which showed no acute abnormality.  CT angiogram  showed severe bilateral M2 branch stenosis as well as moderate right P2 and right M1 stenosis as well.  Echocardiogram showed ejection fraction of 60 to 65%.  LDL cholesterol was 78 mg percent and hemoglobin A1c was 8.8.  Patient was advised to be on aspirin.  He states is done well since then.  She is back to her outpatient MRI scans done on 06/09/2022 as well as 07/09/2022: Which she states is because of headaches but imaging report documents slurred speech and further scan.  Patient's chief complaint today is new onset of nearly daily persistent headaches.  This began in January.  Describes the headache is now occurring every other day.  Describes this as being retro-orbital and burning sensation.  At times it is in the back of the head as well.  This varies in severity from 4/10 to 7/10.  She can tolerate the headache most occasions but she does take Mobic every day for arthritis as well as some Tylenol partial relief.  She does admit to mild nausea and light and occasional sound sensitivity with her headaches but denies any visual disturbance accompanying the headache.  She denies any prior known history of migraine headaches.  She does complain of some neck muscle tightness and pain during these headaches.  She also complains of upper respiratory failure in the feet, diabetic neuropathy.  She is currently on gabapentin 300 mg 3 times daily she is developed some leg swelling and weight gain on that.  She has never tried Topamax or Lyrica..  Last CT scan of the head was on 08/06/2022 which showed no acute abnormality.    ROS:   14  system review of systems is positive for headache, nausea, tingling, numbness, burning and all other systems negative  PMH:  Past Medical History:  Diagnosis Date   Anxiety and depression    Arthritis    CAD (coronary artery disease)    Stent placement circumflex coronary 2007, catheterization 2008 patent stents. 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    High cholesterol    Hx of blood clots    Hypertension    MI (myocardial infarction) (HCC)    2007   Obstructive sleep apnea    CPAP, setting ?2   Palpitations    Sleep apnea    Tachycardia     Social History:  Social History   Socioeconomic History   Marital status: Divorced    Spouse name: Not on file   Number of children: 4   Years of education: Not on file   Highest education level: Not on file  Occupational History   Occupation: Disabled  Tobacco Use   Smoking status: Never   Smokeless tobacco: Never  Vaping Use   Vaping status: Never Used  Substance and Sexual Activity   Alcohol use: No    Alcohol/week: 0.0 standard drinks of alcohol   Drug use: No   Sexual activity: Not on file  Other Topics Concern   Not on file  Social History Narrative   Patient does not get regular exercise   Denies caffeine use    Social Determinants of Health   Financial Resource Strain: Low Risk  (07/21/2022)   Overall Financial Resource Strain (CARDIA)    Difficulty of Paying Living Expenses: Not very hard  Food Insecurity: No Food Insecurity (07/21/2022)   Hunger Vital Sign    Worried About Running Out of Food in the Last Year: Never true    Ran Out of Food in the Last Year: Never true  Transportation Needs: No Transportation Needs (07/21/2022)   PRAPARE - Administrator, Civil Service (Medical): No    Lack of Transportation (Non-Medical): No  Physical Activity: Inactive (07/21/2022)   Exercise Vital Sign    Days of Exercise per Week: 0 days    Minutes of Exercise per Session: 0 min  Stress: Stress Concern Present (10/18/2018)    Received from [facility], [facility] of Occupational Health - Occupational Stress Questionnaire    Feeling of Stress : To some extent  Social Connections: Moderately Integrated (10/18/2018)   Received from [facility], [facility]   Social Connection and Isolation Panel [NHANES]    Frequency of Communication with Friends and Family: More than three times a week    Frequency of Social Gatherings with Friends and Family: More than three times a week    Attends Religious Services: More than 4 times per year    Active Member of Clubs or Organizations: No    Attends Banker Meetings: Never    Marital Status: Living with partner  Intimate Partner Violence: Not At Risk (05/08/2022)  Humiliation, Afraid, Rape, and Kick questionnaire    Fear of Current or Ex-Partner: No    Emotionally Abused: No    Physically Abused: No    Sexually Abused: No    Medications:   Current Outpatient Medications on File Prior to Visit  Medication Sig Dispense Refill   ACCU-CHEK GUIDE test strip See admin instructions.     acetaminophen (TYLENOL) 325 MG tablet Take 2 tablets (650 mg total) by mouth every 4 (four) hours as needed for mild pain (or temp > 37.5 C (99.5 F)). 100 tablet 4   ALPRAZolam (XANAX) 0.5 MG tablet Take 1 tablet (0.5 mg total) by mouth 2 (two) times daily as needed for anxiety or sleep. 12 tablet 0   aspirin EC 81 MG tablet Take 1 tablet (81 mg total) by mouth daily with breakfast. -Take Aspirin 81 mg daily along with Plavix 75 mg daily for 21 days then after that STOP the Plavix  and continue ONLY Aspirin 81 mg daily indefinitely-- 30 tablet 11   atorvastatin (LIPITOR) 40 MG tablet Take 40 mg by mouth daily.     B-D ULTRAFINE III SHORT PEN 31G X 8 MM MISC Inject into the skin 4 (four) times daily.     Blood Glucose Monitoring Suppl (ACCU-CHEK GUIDE ME) w/Device KIT 1 Piece by Does not apply route as directed. 1 kit 0   buPROPion ER (WELLBUTRIN  SR) 100 MG 12 hr tablet Take 1 tablet (100 mg total) by mouth daily. 30 tablet 2   Cholecalciferol (VITAMIN D3) 125 MCG (5000 UT) CAPS TAKE ONE CAPSULE BY MOUTH DAILY (Patient taking differently: Take 5,000 Units by mouth daily.) 90 capsule 0   diclofenac Sodium (VOLTAREN) 1 % GEL Apply topically.     doxepin (SINEQUAN) 25 MG capsule Take 75 mg by mouth at bedtime.      FEROSUL 325 (65 Fe) MG tablet Take 325 mg by mouth every morning.     ferrous sulfate 325 (65 FE) MG EC tablet Take by mouth.     furosemide (LASIX) 40 MG tablet TAKE 1 AND 1/2 TABLETS BY MOUTH DAILY 135 tablet 2   gabapentin (NEURONTIN) 300 MG capsule Take 1 capsule (300 mg total) by mouth 3 (three) times daily. 90 capsule 0   glipiZIDE (GLUCOTROL XL) 5 MG 24 hr tablet Take 5 mg by mouth daily.     glucose blood (ACCU-CHEK GUIDE) test strip USE AS DIRECTED FOUR TIMES DAILY 150 strip 2   Insulin Lispro Prot & Lispro (HUMALOG 75/25 MIX) (75-25) 100 UNIT/ML Kwikpen Inject 90 Units into the skin 2 (two) times daily with a meal. 135 mL 2   isosorbide mononitrate (IMDUR) 30 MG 24 hr tablet TAKE 1/2 TABLET BY MOUTH DAILY (Need TO make APPOINTMENT FOR NEXT refills) (Patient taking differently: Take 30 mg by mouth daily. Take 1/2 tablet daily) 3 tablet 0   lidocaine (XYLOCAINE) 5 % ointment Apply topically 2 (two) times daily.     lisinopril (ZESTRIL) 20 MG tablet Take 1 tablet (20 mg total) by mouth daily. 90 tablet 3   meloxicam (MOBIC) 7.5 MG tablet Take 7.5 mg by mouth daily.     metFORMIN (GLUCOPHAGE) 1000 MG tablet Take 1 tablet (1,000 mg total) by mouth 2 (two) times daily with a meal. 60 tablet 3   methocarbamol (ROBAXIN) 500 MG tablet Take 500 mg by mouth 2 (two) times daily.     metoCLOPramide (REGLAN) 5 MG tablet Take 5 mg by mouth 3 (three) times daily  as needed for nausea or vomiting.   1   metoprolol succinate (TOPROL-XL) 100 MG 24 hr tablet Take 100 mg by mouth daily. Take with or immediately following a meal.      montelukast (SINGULAIR) 10 MG tablet Take 10 mg by mouth at bedtime.      Multiple Vitamin (MULTIVITAMIN) capsule Take 1 capsule by mouth daily.     naloxone (NARCAN) nasal spray 4 mg/0.1 mL SMARTSIG:Both Nares     nitroGLYCERIN (NITROSTAT) 0.4 MG SL tablet Place 1 tablet (0.4 mg total) under the tongue every 5 (five) minutes as needed for chest pain (up to 3 doses. If taking 3rd dose call 911). 25 tablet 3   ondansetron (ZOFRAN) 4 MG tablet Take 4 mg by mouth every other day.     oxyCODONE-acetaminophen (PERCOCET) 10-325 MG tablet Take 1 tablet by mouth every 6 (six) hours as needed for pain. 120 tablet 0   OZEMPIC, 0.25 OR 0.5 MG/DOSE, 2 MG/3ML SOPN INJECT 0.25 MG UNDER THE SKIN WEEKLY FOR 4 WEEKS, THEN increase TO 0.5 MG UNDER THE SKIN WEEKLY 3 mL 1   pantoprazole (PROTONIX) 40 MG tablet TAKE 1 TABLET BY MOUTH TWICE DAILY BEFORE MEALS (Patient taking differently: Take 40 mg by mouth 2 (two) times daily before a meal.) 60 tablet 1   polyethylene glycol (MIRALAX / GLYCOLAX) 17 g packet Take 17 g by mouth daily as needed for moderate constipation.     potassium chloride (KLOR-CON) 10 MEQ tablet Take 10 mEq by mouth 2 (two) times daily.     PROAIR HFA 108 (90 Base) MCG/ACT inhaler Inhale 2 puffs into the lungs every 4 (four) hours as needed for wheezing or shortness of breath.   8   promethazine (PHENERGAN) 25 MG tablet Take 25 mg by mouth 4 (four) times daily as needed.     senna-docusate (SENOKOT-S) 8.6-50 MG tablet Take 2 tablets by mouth at bedtime. 60 tablet 2   SSD 1 % cream Apply topically 2 (two) times daily.     SURE COMFORT INS SYR 1CC/30G 30G X 5/16" 1 ML MISC 1 each by Other route daily.     topiramate (TOPAMAX) 50 MG tablet Take 1 tablet (50 mg total) by mouth 2 (two) times daily. 60 tablet 3   No current facility-administered medications on file prior to visit.    Allergies:   Allergies  Allergen Reactions   Amphetamine-Dextroamphetamine Swelling   Nitrofuran Derivatives Itching  and Swelling   Amphetamine-Dextroamphet Er Swelling   Pregabalin Swelling   Topiramate Other (See Comments)    Tongue tingle   Verelan [Verapamil] Rash    Physical Exam There were no vitals filed for this visit. There is no height or weight on file to calculate BMI.   General: Obese middle-aged Caucasian lady seated, in no evident distress Head: head normocephalic and atraumatic.   Neck: supple with no carotid or supraclavicular bruits Cardiovascular: regular rate and rhythm, no murmurs Musculoskeletal: no deformity Skin:  no rash/petichiae Vascular:  Normal pulses all extremities  Neurologic Exam Mental Status: Awake and fully alert. Oriented to place and time. Recent and remote memory intact. Attention span, concentration and fund of knowledge appropriate. Mood and affect appropriate.  Cranial Nerves: Pupils equal, briskly reactive to light. Extraocular movements full without nystagmus. Visual fields full to confrontation. Hearing intact. Facial sensation intact. Face, tongue, palate moves normally and symmetrically.  Motor: Normal bulk and tone. Normal strength in all tested extremity muscles. Sensory.:  Diminished touch , pinprick ,  position and vibratory sensation in both feet from.  Coordination: Rapid alternating movements normal in all extremities. Finger-to-nose and heel-to-shin performed accurately bilaterally.  Unable to stand on a narrow-based or on either foot unsupported.  Romberg's positive. Gait and Station: Arises from chair without difficulty. Stance is normal. Gait is broad-based and uses a wheeled walker.  Unable to heel, toe and tandem walk without difficulty.  Reflexes: 1+ and symmetric. Toes downgoing.      ASSESSMENT: 71 year old lady with hx of TIA in 04/2022 and complaints of new onset persistent headaches since 02/2022 likely transformed tension headaches with component of analgesic rebound.  She also has lower extremity paresthesias from diabetic peripheral  neuropathy    PLAN:  1.  Chronic headaches  -Continue topiramate 50 mg twice daily  -Continue to follow with pain management, cervicalgia likely contributing ***  -MR brain 04/2022 no findings contributing to headaches  2. TIA  -Continue aspirin and atorvastatin for secondary stroke prevention measures managed/prescribed by PCP  -Continue to follow with PCP for aggressive stroke risk factor management including BP goal<130/90, HLD with LDL goal<70 and DM with A1c.<7    I had a long discussion with the patient regarding her almost daily headaches history of present transformed tension headaches and high recommend she minimize taking over-the-counter pain pills like Tylenol and do regular neck stretching and diabetes.  Trial of Topamax 50 mg twice daily for the headaches and neuropathy pain from diabetic neuropathy.  Aspirin for stroke prevention and aggressive risk factor modification with strict control of diabetes with hemoglobin A1c goal below 7, lipids with LDL cholesterol goal below 70 mg percent and hypertension with blood pressure goal below 130/90.  She will return for follow-up in the future in 3 months with my nurse practitioner or call earlier if necessary.     I spent *** minutes of face-to-face and non-face-to-face time with patient.  This included previsit chart review, lab review, study review, order entry, electronic health record documentation, patient education and discussion regarding above diagnoses and treatment plan and answered all other questions to patient's satisfaction  Ihor Viswanathan, [facility]  [place]  [facility] [address] [place], [place] [address]  Phone [phone] Fax [phone] Note: This document was prepared with digital dictation and possible smart phrase technology. Any transcriptional errors that result from this process are unintentional.

## 2023-01-06 ENCOUNTER — Ambulatory Visit: Payer: 59 | Admitting: Adult Health

## 2023-01-08 ENCOUNTER — Other Ambulatory Visit: Payer: Self-pay | Admitting: Neurology

## 2023-01-16 DIAGNOSIS — G4733 Obstructive sleep apnea (adult) (pediatric): Secondary | ICD-10-CM | POA: Diagnosis not present

## 2023-01-16 DIAGNOSIS — M797 Fibromyalgia: Secondary | ICD-10-CM | POA: Diagnosis not present

## 2023-01-16 DIAGNOSIS — I509 Heart failure, unspecified: Secondary | ICD-10-CM | POA: Diagnosis not present

## 2023-01-16 DIAGNOSIS — R0902 Hypoxemia: Secondary | ICD-10-CM | POA: Diagnosis not present

## 2023-01-16 DIAGNOSIS — M159 Polyosteoarthritis, unspecified: Secondary | ICD-10-CM | POA: Diagnosis not present

## 2023-01-29 ENCOUNTER — Encounter: Payer: 59 | Attending: Registered Nurse | Admitting: Registered Nurse

## 2023-01-29 ENCOUNTER — Encounter: Payer: Self-pay | Admitting: Registered Nurse

## 2023-01-29 VITALS — BP 110/71 | HR 85 | Ht 64.0 in | Wt 238.0 lb

## 2023-01-29 DIAGNOSIS — Z5181 Encounter for therapeutic drug level monitoring: Secondary | ICD-10-CM

## 2023-01-29 DIAGNOSIS — M546 Pain in thoracic spine: Secondary | ICD-10-CM | POA: Diagnosis not present

## 2023-01-29 DIAGNOSIS — M7061 Trochanteric bursitis, right hip: Secondary | ICD-10-CM | POA: Diagnosis not present

## 2023-01-29 DIAGNOSIS — M5412 Radiculopathy, cervical region: Secondary | ICD-10-CM

## 2023-01-29 DIAGNOSIS — M7062 Trochanteric bursitis, left hip: Secondary | ICD-10-CM | POA: Diagnosis not present

## 2023-01-29 DIAGNOSIS — M542 Cervicalgia: Secondary | ICD-10-CM | POA: Diagnosis not present

## 2023-01-29 DIAGNOSIS — G894 Chronic pain syndrome: Secondary | ICD-10-CM

## 2023-01-29 DIAGNOSIS — G8929 Other chronic pain: Secondary | ICD-10-CM | POA: Diagnosis not present

## 2023-01-29 DIAGNOSIS — M25512 Pain in left shoulder: Secondary | ICD-10-CM | POA: Diagnosis not present

## 2023-01-29 DIAGNOSIS — M5416 Radiculopathy, lumbar region: Secondary | ICD-10-CM | POA: Diagnosis not present

## 2023-01-29 DIAGNOSIS — M48062 Spinal stenosis, lumbar region with neurogenic claudication: Secondary | ICD-10-CM | POA: Diagnosis not present

## 2023-01-29 DIAGNOSIS — M25511 Pain in right shoulder: Secondary | ICD-10-CM | POA: Diagnosis not present

## 2023-01-29 DIAGNOSIS — M1712 Unilateral primary osteoarthritis, left knee: Secondary | ICD-10-CM | POA: Diagnosis not present

## 2023-01-29 DIAGNOSIS — M2550 Pain in unspecified joint: Secondary | ICD-10-CM | POA: Diagnosis not present

## 2023-01-29 DIAGNOSIS — M1711 Unilateral primary osteoarthritis, right knee: Secondary | ICD-10-CM

## 2023-01-29 MED ORDER — OXYCODONE-ACETAMINOPHEN 10-325 MG PO TABS: 1.0000 | ORAL_TABLET | Freq: Four times a day (QID) | ORAL | 0 refills | Status: DC | PRN

## 2023-01-29 NOTE — Progress Notes (Addendum)
Subjective:    Patient ID: [name], female    DOB: Jul 17, 1950, 72 y.o.   MRN: [id number]  HPI: [name] is a 72 y.o. female who returns for follow up appointment for chronic pain and medication refill. She states her pain is located in her neck radiating into her bilateral shoulders, mid- lower back pain radiating into her bilateral hips and bilateral lower extremities. Also reports bilateral knee pain and generalized joint pain.She rates her pain 7. Her current exercise regime is walking and performing stretching exercises.  [name] reports she has been having right mid-sternal muscular pain, she denies chest pain and SOB, she refuses ED evaluation.   [name] Morphine equivalent is  60.00 MME. She is also prescribed Alprazolam  by Denton Meek .We have discussed the black box warning of using opioids and benzodiazepines. I highlighted the dangers of using these drugs together and discussed the adverse events including respiratory suppression, overdose, cognitive impairment and importance of compliance with current regimen. We will continue to monitor and adjust as indicated.   Pain Inventory Average Pain 6 Pain Right Now 7 My pain is sharp, burning, dull, stabbing, tingling, and aching  In the last 24 hours, has pain interfered with the following? General activity 8 Relation with others 8 Enjoyment of life 8 What TIME of day is your pain at its worst? morning , daytime, evening, and night Sleep (in general) Poor  Pain is worse with: walking, bending, standing, and some activites Pain improves with: medication Relief from Meds: 4  Family History  Problem Relation Age of Onset   Heart attack Mother    Colon cancer Mother    Heart attack Father    Stroke Sister    Hemophilia Child    Cancer Other    Coronary artery disease Other    Cancer Brother    Cancer Maternal Aunt    Cancer Maternal Uncle    Cancer Paternal Aunt    Cancer Paternal Uncle    Cancer  Brother    Social History   Socioeconomic History   Marital status: Divorced    Spouse name: Not on file   Number of children: 4   Years of education: Not on file   Highest education level: Not on file  Occupational History   Occupation: Disabled  Tobacco Use   Smoking status: Never   Smokeless tobacco: Never  Vaping Use   Vaping status: Never Used  Substance and Sexual Activity   Alcohol use: No    Alcohol/week: 0.0 standard drinks of alcohol   Drug use: No   Sexual activity: Not on file  Other Topics Concern   Not on file  Social History Narrative   Patient does not get regular exercise   Denies caffeine use    Social Determinants of Health   Financial Resource Strain: Low Risk  (07/21/2022)   Overall Financial Resource Strain (CARDIA)    Difficulty of Paying Living Expenses: Not very hard  Food Insecurity: No Food Insecurity (07/21/2022)   Hunger Vital Sign    Worried About Running Out of Food in the Last Year: Never true    Ran Out of Food in the Last Year: Never true  Transportation Needs: No Transportation Needs (07/21/2022)   PRAPARE - Administrator, Civil Service (Medical): No    Lack of Transportation (Non-Medical): No  Physical Activity: Inactive (07/21/2022)   Exercise Vital Sign    Days of Exercise per Week:  0 days    Minutes of Exercise per Session: 0 min  Stress: Stress Concern Present (10/18/2018)   Received from [facility], [facility] - Occupational Stress Questionnaire    Feeling of Stress : To some extent  Social Connections: Moderately Integrated (10/18/2018)   [facility], [facility]   Social Connection and Isolation Panel [NHANES]    Frequency of Communication with Friends and Family: More than three times a week    Frequency of Social Gatherings with Friends and Family: More than three times a week    Attends Religious Services: More than 4 times per year    Active  Member of [employer] or Organizations: No    Attends Banker Meetings: Never    Marital Status: Living with partner   Past Surgical History:  Procedure Laterality Date   ABDOMINAL HYSTERECTOMY     Arm Surgery Bilateral    due to fall-pt broke both forearms   BIOPSY N/A 07/01/2013   Procedure: BIOPSY;  Surgeon: Malissa Hippo, MD;  Location: [facility];  Service: Endoscopy;  Laterality: N/A;   CHOLECYSTECTOMY     COLONOSCOPY  08/06/2011   Procedure: COLONOSCOPY;  Surgeon: Malissa Hippo, MD;  [facility];  Service: Endoscopy;  Laterality: N/A;  215   COLONOSCOPY WITH PROPOFOL N/A 12/31/2012   Procedure: COLONOSCOPY WITH PROPOFOL;  Surgeon: Malissa Hippo, MD;  Location: [facility];  Service: Endoscopy;  Laterality: N/A;  in cecum at 0814, total withdrawal time   COLONOSCOPY WITH PROPOFOL N/A 02/05/2018   Procedure: COLONOSCOPY WITH PROPOFOL;  Surgeon: Malissa Hippo, MD;  [facility];  Service: Endoscopy;  Laterality: N/A;  1:25   CORONARY ANGIOPLASTY WITH STENT PLACEMENT     CYSTOSCOPY W/ URETERAL STENT PLACEMENT Right 11/10/2019   at [facility]   CYSTOSCOPY WITH RETROGRADE PYELOGRAM, URETEROSCOPY AND STENT PLACEMENT Right 12/22/2019   Procedure: CYSTOSCOPY WITH RIGHT URETERAL STENT REMOVAL; RIGHT RETROGRADE PYELOGRAM,RIGHT URETEROSCOPY;  Surgeon: Malen Gauze, MD;  Location: [facility];  Service: Urology;  Laterality: Right;   ESOPHAGEAL DILATION N/A 06/07/2015   Procedure: ESOPHAGEAL DILATION;  Surgeon: Malissa Hippo, MD;  [facility];  Service: Endoscopy;  Laterality: N/A;   ESOPHAGOGASTRODUODENOSCOPY N/A 06/07/2015   Procedure: ESOPHAGOGASTRODUODENOSCOPY (EGD);  Surgeon: Malissa Hippo, MD;  [facility];  Service: Endoscopy;  Laterality: N/A;  2:45 - moved to 1:00 - Ann to notify pt   ESOPHAGOGASTRODUODENOSCOPY (EGD) WITH PROPOFOL N/A 12/31/2012   Procedure: ESOPHAGOGASTRODUODENOSCOPY (EGD) WITH PROPOFOL;  Surgeon: Malissa Hippo,  MD;  [facility]: [facility];  Service: Endoscopy;  Laterality: N/A;  GE junction 37,   ESOPHAGOGASTRODUODENOSCOPY (EGD) WITH PROPOFOL N/A 07/01/2013   Procedure: ESOPHAGOGASTRODUODENOSCOPY (EGD) WITH PROPOFOL;  Surgeon: Malissa Hippo, MD;  [facility]: [facility];  Service: Endoscopy;  Laterality: N/A;  950   MALONEY DILATION N/A 12/31/2012   Procedure: MALONEY DILATION;  Surgeon: Malissa Hippo, MD;  [facility]: [facility];  Service: Endoscopy;  Laterality: N/A;  used a # 54,#56   MALONEY DILATION N/A 07/01/2013   Procedure: MALONEY DILATION;  Surgeon: Malissa Hippo, MD;  [facility]: [facility];  Service: Endoscopy;  Laterality: N/A;  54/58; no heme present   POLYPECTOMY  02/05/2018   Procedure: POLYPECTOMY;  Surgeon: Malissa Hippo, MD;  [facility];  Service: Endoscopy;;  distal rectum (HS)   STONE EXTRACTION WITH BASKET Right 12/22/2019  Procedure: STONE EXTRACTION WITH BASKET;  Surgeon: Malen Gauze, MD;  [facility]: [facility];  Service: Urology;  Laterality: Right;   Past Surgical History:  Procedure Laterality Date   ABDOMINAL HYSTERECTOMY     Arm Surgery Bilateral    due to fall-pt broke both forearms   BIOPSY N/A 07/01/2013   Procedure: BIOPSY;  Surgeon: Malissa Hippo, MD;  [facility];  Service: Endoscopy;  Laterality: N/A;   CHOLECYSTECTOMY     COLONOSCOPY  08/06/2011   Procedure: COLONOSCOPY;  Surgeon: Malissa Hippo, MD;  [facility];  Service: Endoscopy;  Laterality: N/A;  215   COLONOSCOPY WITH PROPOFOL N/A 12/31/2012   Procedure: COLONOSCOPY WITH PROPOFOL;  Surgeon: Malissa Hippo, MD;  [facility]: [facility];  Service: Endoscopy;  Laterality: N/A;  in cecum at 0814, total withdrawal time   COLONOSCOPY WITH PROPOFOL N/A 02/05/2018   Procedure: COLONOSCOPY WITH PROPOFOL;  Surgeon: Malissa Hippo, MD;  [facility];  Service: Endoscopy;  Laterality: N/A;  1:25   CORONARY ANGIOPLASTY WITH STENT PLACEMENT     CYSTOSCOPY W/ URETERAL STENT PLACEMENT  Right 11/10/2019   at [facility]   CYSTOSCOPY WITH RETROGRADE PYELOGRAM, URETEROSCOPY AND STENT PLACEMENT Right 12/22/2019   Procedure: CYSTOSCOPY WITH RIGHT URETERAL STENT REMOVAL; RIGHT RETROGRADE PYELOGRAM,RIGHT URETEROSCOPY;  Surgeon: Malen Gauze, MD;  [facility]: [facility];  Service: Urology;  Laterality: Right;   ESOPHAGEAL DILATION N/A 06/07/2015   Procedure: ESOPHAGEAL DILATION;  Surgeon: Malissa Hippo, MD;  [facility];  Service: Endoscopy;  Laterality: N/A;   ESOPHAGOGASTRODUODENOSCOPY N/A 06/07/2015   Procedure: ESOPHAGOGASTRODUODENOSCOPY (EGD);  Surgeon: Malissa Hippo, MD;  [facility];  Service: Endoscopy;  Laterality: N/A;  2:45 - moved to 1:00 - Ann to notify pt   ESOPHAGOGASTRODUODENOSCOPY (EGD) WITH PROPOFOL N/A 12/31/2012   Procedure: ESOPHAGOGASTRODUODENOSCOPY (EGD) WITH PROPOFOL;  Surgeon: Malissa Hippo, MD;  [facility];  Service: Endoscopy;  Laterality: N/A;  GE junction 37,   ESOPHAGOGASTRODUODENOSCOPY (EGD) WITH PROPOFOL N/A 07/01/2013   Procedure: ESOPHAGOGASTRODUODENOSCOPY (EGD) WITH PROPOFOL;  Surgeon: Malissa Hippo, MD;  [facility]: [facility];  Service: Endoscopy;  Laterality: N/A;  950   MALONEY DILATION N/A 12/31/2012   Procedure: MALONEY DILATION;  Surgeon: Malissa Hippo, MD;  [facility];  Service: Endoscopy;  Laterality: N/A;  used a # 54,#56   MALONEY DILATION N/A 07/01/2013   Procedure: MALONEY DILATION;  Surgeon: Malissa Hippo, MD;  [facility];  Service: Endoscopy;  Laterality: N/A;  54/58; no heme present   POLYPECTOMY  02/05/2018   Procedure: POLYPECTOMY;  Surgeon: Malissa Hippo, MD;  [facility];  Service: Endoscopy;;  distal rectum (HS)   STONE EXTRACTION WITH BASKET Right 12/22/2019   Procedure: STONE EXTRACTION WITH BASKET;  Surgeon: Malen Gauze, MD;  [facility]: [facility];  Service: Urology;  Laterality: Right;   Past Medical History:  Diagnosis Date   Anxiety and depression    Arthritis    CAD  (coronary artery disease)    Stent placement circumflex coronary 2007, catheterization 2008 patent stents. 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    High cholesterol    Hx of blood clots    Hypertension    MI (myocardial infarction) (HCC)    2007   Obstructive sleep apnea    CPAP, setting ?2   Palpitations    Sleep apnea    Tachycardia    BP 110/71   Pulse 85   Ht 5\' 4"  (1.626 m)   Wt 238 lb (108 kg)   SpO2 97%   BMI 40.85 kg/m   Opioid Risk Score:   Fall Risk Score:  `1  Depression screen PHQ 2/9     01/01/2023    1:51 PM 12/04/2022    2:27 PM 11/04/2022   10:59 AM 10/06/2022    9:48 AM 08/26/2022    1:53 PM 07/28/2022   11:51 AM 10/17/2021    1:56 PM  Depression screen PHQ 2/9  Decreased Interest 0 1 0 1 1 1 3   Down, Depressed, Hopeless 0 1 0 1 1 1 3   PHQ - 2 Score 0 2 0 2 2 2 6   Altered sleeping       0  Tired, decreased energy       1  Change in appetite       0  Feeling bad or failure about yourself        0  Trouble concentrating       1  Moving slowly or fidgety/restless       0  Suicidal thoughts       0  PHQ-9 Score       8  Difficult doing work/chores       Not difficult at all      Review of Systems     Objective:   Physical Exam Vitals and nursing note reviewed.  Constitutional:      Appearance: Normal appearance. She is obese.  Neck:     Comments: Cervical Paraspinal Tenderness: C-5- C-6  Cardiovascular:     Rate and Rhythm: Normal rate and regular rhythm.     Pulses: Normal pulses.     Heart sounds: Normal heart sounds.  Pulmonary:     Effort: Pulmonary effort is normal.      Breath sounds: Normal breath sounds.  Musculoskeletal:     Comments: Normal Muscle Bulk and Muscle Testing Reveals:  Upper Extremities: Full ROM and Muscle Strength 5/5 Bilateral AC Joint Tenderness Thoracic and Lumbar Hypersensitivity Bilateral Greater Trochanter Tenderness Lower Extremities: Full ROM and Muscle Strength 5/5 Bilateral Lower Extremities Flexion Produces Pain into her Bilateral Lower Extremities and Bilateral Knees  Arises from Chair slowly using walker for support Antalgic  Gait     Skin:    General: Skin is warm and dry.  Neurological:     Mental Status: She is alert and oriented to person, place, and time.  Psychiatric:        Mood and Affect: Mood normal.        Behavior: Behavior normal.           Assessment & Plan:  1.Lumbar pain lumbar spondylosis:  Encouraged to continue to increase Activity as tolerated. 01/29/2023 Continue current medication regimen. Refilled Oxycodone 10/325 mg one tablet every 6 hours as needed for pain. #120.  We will continue the opioid monitoring program, this consists of regular clinic visits, examinations, urine drug screen, pill counts as well as use of [place] Controlled Substance Reporting system. A 12 month History has been reviewed on the [place] Controlled Substance Reporting System on 01/29/2023.  2. Lumbar Radiculitis: Continue current medication regimen with Gabapentin. 01/29/2023 3. Bilateral Knee Pain/ Degenerative: Continue Voltaren Gel. Continue to Monitor. 01/29/2023 4. Severe diabetic poly neuropathy: Continue current medication regimen with Gabapentin. 01/29/2023 5. Insomnia: No complaints today. Continue to Monitor.  01/29/2023 6.Anxiety/Depression: Continue Current Medication regimen as prescribed by PCP and   Psychiatry. 01/29/2023 7. De Quervains Tenosynovitis: No Complaints Today. 01/29/2023 8. Bilateral  Greater Trochanteric Bursitis: Continue with Ice and Heat Therapy. Continue to Monitor.  01/29/2023.  9. Cervicalgia/ Cervical Radiculitis:Continue Gabapentin. Continue with HEP as Tolerated and continue to monitor. 01/29/2023 10. Chronic Bilateral Thoracic Back Pain: Continue HEP as Tolerated. Continue current medication regimen. Continue to monitor. 01/29/2023.     F/U in 1 month

## 2023-02-02 ENCOUNTER — Institutional Professional Consult (permissible substitution): Payer: 59 | Admitting: Primary Care

## 2023-02-06 ENCOUNTER — Encounter (INDEPENDENT_AMBULATORY_CARE_PROVIDER_SITE_OTHER): Payer: Self-pay | Admitting: *Deleted

## 2023-02-14 DIAGNOSIS — K219 Gastro-esophageal reflux disease without esophagitis: Secondary | ICD-10-CM | POA: Diagnosis not present

## 2023-02-14 DIAGNOSIS — I10 Essential (primary) hypertension: Secondary | ICD-10-CM | POA: Diagnosis not present

## 2023-02-14 DIAGNOSIS — R21 Rash and other nonspecific skin eruption: Secondary | ICD-10-CM | POA: Diagnosis not present

## 2023-02-14 DIAGNOSIS — L239 Allergic contact dermatitis, unspecified cause: Secondary | ICD-10-CM | POA: Diagnosis not present

## 2023-02-14 DIAGNOSIS — I2510 Atherosclerotic heart disease of native coronary artery without angina pectoris: Secondary | ICD-10-CM | POA: Diagnosis not present

## 2023-02-14 DIAGNOSIS — J449 Chronic obstructive pulmonary disease, unspecified: Secondary | ICD-10-CM | POA: Diagnosis not present

## 2023-02-14 DIAGNOSIS — L209 Atopic dermatitis, unspecified: Secondary | ICD-10-CM | POA: Diagnosis not present

## 2023-02-14 DIAGNOSIS — E119 Type 2 diabetes mellitus without complications: Secondary | ICD-10-CM | POA: Diagnosis not present

## 2023-02-24 ENCOUNTER — Ambulatory Visit: Payer: 59 | Admitting: Cardiology

## 2023-02-24 NOTE — Progress Notes (Unsigned)
Clinical Summary [name] is a 72 y.o.female  seen today for follow up of the following medical problems.   1. CAD -history of MI in 2007, had PCI to LCX  08/2020 nuclear stress no ischemia  07/2020 echo: LVEF 55-60%, no WMAs, grade I dd, normal RV   -chronic chest pains - pain midchest, pressure like pain. Can occur at rest or with activity. Not positional. Lasts 20-30 minutes. Can be better with prevacid but also NG. 2-3 epsidoes per week.  - last visit tried adding imdur 15mg  daily.   2. Chronic diastolic HF - increased swelling - compliant with lasix 40mg  daily   3. OSA  - was to establish with pulmionary   4. Afib - 10/2019 admitted to [facility] - UTI with sepsis, had some afib during that admission. No prior history.  - was started on eliquis at the time but Rx ran out, no longer taking.   04/2020 30 day monitor: no arrhythmias Has not been committed to long term anticoag     5. HTN - compliant with meds  6. Morbid obesity - started on ozempic Past Medical History:  Diagnosis Date   Anxiety and depression    Arthritis    CAD (coronary artery disease)    Stent placement circumflex coronary 2007, catheterization 2008 patent stents. 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     acetaminophen (TYLENOL) 325 MG tablet Take 2 tablets (650 mg total) by mouth every 4 (four) hours as needed for mild pain (or temp > 37.5 C (99.5 F)). 100 tablet 4   ALPRAZolam (XANAX) 0.5 MG tablet Take 1 tablet (0.5 mg total) by mouth 2 (two) times daily as needed for anxiety or sleep. 12 tablet 0   aspirin EC 81 MG tablet Take 1 tablet (81 mg total) by mouth daily with breakfast. -Take Aspirin 81 mg daily along with Plavix 75 mg daily for 21 days then after that STOP the Plavix  and continue ONLY Aspirin 81 mg daily indefinitely-- 30 tablet 11   atorvastatin (LIPITOR) 40 MG tablet Take 40 mg by mouth daily.     B-D ULTRAFINE III SHORT PEN 31G X 8 MM MISC Inject into the skin 4 (four) times daily.     Blood Glucose Monitoring Suppl (ACCU-CHEK GUIDE ME) w/Device KIT 1 Piece by  Does not apply route as directed. 1 kit 0   buPROPion ER (WELLBUTRIN SR) 100 MG 12 hr tablet Take 1 tablet (100 mg total) by mouth daily. 30 tablet 2   Cholecalciferol (VITAMIN D3) 125 MCG (5000 UT) CAPS TAKE ONE CAPSULE BY MOUTH DAILY (Patient taking differently: Take 5,000 Units by mouth daily.) 90 capsule 0   diclofenac Sodium (VOLTAREN) 1 % GEL Apply topically.     doxepin (SINEQUAN) 25 MG capsule Take 75 mg by mouth at bedtime.      FEROSUL 325 (65 Fe) MG tablet Take 325 mg by mouth every morning.     ferrous sulfate 325 (65 FE) MG EC tablet Take by mouth.     furosemide (LASIX) 40 MG tablet TAKE 1 AND 1/2 TABLETS BY MOUTH DAILY 135 tablet 2   gabapentin (NEURONTIN) 300 MG capsule Take 1 capsule (300 mg total) by mouth 3 (three) times daily. 90 capsule 0   glipiZIDE (GLUCOTROL XL) 5 MG 24 hr tablet Take 5 mg by mouth daily.     glucose blood  (ACCU-CHEK GUIDE) test strip USE AS DIRECTED FOUR TIMES DAILY 150 strip 2   Insulin Lispro Prot & Lispro (HUMALOG 75/25 MIX) (75-25) 100 UNIT/ML Kwikpen Inject 90 Units into the skin 2 (two) times daily with a meal. 135 mL 2   isosorbide mononitrate (IMDUR) 30 MG 24 hr tablet TAKE 1/2 TABLET BY MOUTH DAILY (Need TO make APPOINTMENT FOR NEXT refills) (Patient taking differently: Take 30 mg by mouth daily. Take 1/2 tablet daily) 3 tablet 0   lidocaine (XYLOCAINE) 5 % ointment Apply topically 2 (two) times daily.     lisinopril (ZESTRIL) 20 MG tablet Take 1 tablet (20 mg total) by mouth daily. 90 tablet 3   meloxicam (MOBIC) 7.5 MG tablet Take 7.5 mg by mouth daily.     metFORMIN (GLUCOPHAGE) 1000 MG tablet Take 1 tablet (1,000 mg total) by mouth 2 (two) times daily with a meal. 60 tablet 3   methocarbamol (ROBAXIN) 500 MG tablet Take 500 mg by mouth 2 (two) times daily.     metoCLOPramide (REGLAN) 5 MG tablet Take 5 mg by mouth 3 (three) times daily as needed for nausea or vomiting.   1   metoprolol succinate (TOPROL-XL) 100 MG 24 hr tablet Take 100 mg by mouth daily. Take with or immediately following a meal.     montelukast (SINGULAIR) 10 MG tablet Take 10 mg by mouth at bedtime.      Multiple Vitamin (MULTIVITAMIN) capsule Take 1 capsule by mouth daily.     naloxone (NARCAN) nasal spray 4 mg/0.1 mL SMARTSIG:Both Nares     nitroGLYCERIN (NITROSTAT) 0.4 MG SL tablet Place 1 tablet (0.4 mg total) under the tongue every 5 (five) minutes as needed for chest pain (up to 3 doses. If taking 3rd dose call 911). 25 tablet 3   ondansetron (ZOFRAN) 4 MG tablet Take 4 mg by mouth every other day.     oxyCODONE-acetaminophen (PERCOCET) 10-325 MG tablet Take 1 tablet by mouth every 6 (six) hours as needed for pain. 120 tablet 0   OZEMPIC, 0.25 OR 0.5 MG/DOSE, 2 MG/3ML SOPN INJECT 0.25 MG UNDER THE SKIN WEEKLY FOR 4 WEEKS, THEN increase TO 0.5 MG UNDER THE SKIN WEEKLY 3 mL 1   pantoprazole (PROTONIX) 40 MG tablet  TAKE 1 TABLET BY MOUTH TWICE DAILY BEFORE MEALS (Patient taking differently: Take 40 mg by mouth 2 (two) times daily before a meal.) 60 tablet 1  polyethylene glycol (MIRALAX / GLYCOLAX) 17 g packet Take 17 g by mouth daily as needed for moderate constipation.     potassium chloride (KLOR-CON) 10 MEQ tablet Take 10 mEq by mouth 2 (two) times daily.     PROAIR HFA 108 (90 Base) MCG/ACT inhaler Inhale 2 puffs into the lungs every 4 (four) hours as needed for wheezing or shortness of breath.   8   promethazine (PHENERGAN) 25 MG tablet Take 25 mg by mouth 4 (four) times daily as needed.     senna-docusate (SENOKOT-S) 8.6-50 MG tablet Take 2 tablets by mouth at bedtime. 60 tablet 2   SSD 1 % cream Apply topically 2 (two) times daily.     SURE COMFORT INS SYR 1CC/30G 30G X 5/16" 1 ML MISC 1 each by Other route daily.     topiramate (TOPAMAX) 50 MG tablet TAKE 1 TABLET BY MOUTH TWICE DAILY 60 tablet 3   No current facility-administered medications for this visit.     Past Surgical History:  Procedure Laterality Date   ABDOMINAL HYSTERECTOMY     Arm Surgery Bilateral    due to fall-pt broke both forearms   BIOPSY N/A 07/01/2013   Procedure: BIOPSY;  Surgeon: Malissa Hippo, MD;  Location: [facility];  Service: Endoscopy;  Laterality: N/A;   CHOLECYSTECTOMY     COLONOSCOPY  08/06/2011   Procedure: COLONOSCOPY;  Surgeon: Malissa Hippo, MD;  Location: [facility];  Service: Endoscopy;  Laterality: N/A;  215   COLONOSCOPY WITH PROPOFOL N/A 12/31/2012   Procedure: COLONOSCOPY WITH PROPOFOL;  Surgeon: Malissa Hippo, MD;  Location: [facility];  Service: Endoscopy;  Laterality: N/A;  in cecum at 0814, total withdrawal time   COLONOSCOPY WITH PROPOFOL N/A 02/05/2018   Procedure: COLONOSCOPY WITH PROPOFOL;  Surgeon: Malissa Hippo, MD;  [facility]: [facility];  Service: Endoscopy;  Laterality: N/A;  1:25   CORONARY ANGIOPLASTY WITH STENT PLACEMENT     CYSTOSCOPY W/ URETERAL STENT PLACEMENT Right  11/10/2019   at [facility]   CYSTOSCOPY WITH RETROGRADE PYELOGRAM, URETEROSCOPY AND STENT PLACEMENT Right 12/22/2019   Procedure: CYSTOSCOPY WITH RIGHT URETERAL STENT REMOVAL; RIGHT RETROGRADE PYELOGRAM,RIGHT URETEROSCOPY;  Surgeon: Malen Gauze, MD;  Location: [facility];  Service: Urology;  Laterality: Right;   ESOPHAGEAL DILATION N/A 06/07/2015   Procedure: ESOPHAGEAL DILATION;  Surgeon: Malissa Hippo, MD;  [facility]: [facility];  Service: Endoscopy;  Laterality: N/A;   ESOPHAGOGASTRODUODENOSCOPY N/A 06/07/2015   Procedure: ESOPHAGOGASTRODUODENOSCOPY (EGD);  Surgeon: Malissa Hippo, MD;  [facility]: [facility];  Service: Endoscopy;  Laterality: N/A;  2:45 - moved to 1:00 - Ann to notify pt   ESOPHAGOGASTRODUODENOSCOPY (EGD) WITH PROPOFOL N/A 12/31/2012   Procedure: ESOPHAGOGASTRODUODENOSCOPY (EGD) WITH PROPOFOL;  Surgeon: Malissa Hippo, MD;  Location: [facility];  Service: Endoscopy;  Laterality: N/A;  GE junction 37,   ESOPHAGOGASTRODUODENOSCOPY (EGD) WITH PROPOFOL N/A 07/01/2013   Procedure: ESOPHAGOGASTRODUODENOSCOPY (EGD) WITH PROPOFOL;  Surgeon: Malissa Hippo, MD;  Location: [facility];  Service: Endoscopy;  Laterality: N/A;  950   MALONEY DILATION N/A 12/31/2012   Procedure: MALONEY DILATION;  Surgeon: Malissa Hippo, MD;  Location: [facility];  Service: Endoscopy;  Laterality: N/A;  used a # 54,#56   MALONEY DILATION N/A 07/01/2013   Procedure: MALONEY DILATION;  Surgeon: Malissa Hippo, MD;  Location: [facility];  Service: Endoscopy;  Laterality: N/A;  54/58; no heme present   POLYPECTOMY  02/05/2018   Procedure: POLYPECTOMY;  Surgeon:  Malissa Hippo, MD;  [facility];  Service: Endoscopy;;  distal rectum (HS)   STONE EXTRACTION WITH BASKET Right 12/22/2019   Procedure: STONE EXTRACTION WITH BASKET;  Surgeon: Malen Gauze, MD;  Location: [facility];  Service: Urology;  Laterality: Right;     Allergies  Allergen Reactions   Amphetamine-Dextroamphetamine Swelling   Nitrofuran  Derivatives Itching and Swelling   Amphetamine-Dextroamphet Er Swelling   Pregabalin Swelling   Topiramate Other (See Comments)    Tongue tingle   Verelan [Verapamil] Rash      Family History  Problem Relation Age of Onset   Heart attack Mother    Colon cancer Mother    Heart attack Father    Stroke Sister    Hemophilia Child    Cancer Other    Coronary artery disease Other    Cancer Brother    Cancer Maternal Aunt    Cancer Maternal Uncle    Cancer Paternal Aunt    Cancer Paternal Uncle    Cancer Brother      Social History Ms. Hilligoss reports that she has never smoked. She has never used smokeless tobacco. Ms. Weidner reports no history of alcohol use.   Review of Systems CONSTITUTIONAL: No weight loss, fever, chills, weakness or fatigue.  HEENT: Eyes: No visual loss, blurred vision, double vision or yellow sclerae.No hearing loss, sneezing, congestion, runny nose or sore throat.  SKIN: No rash or itching.  CARDIOVASCULAR:  RESPIRATORY: No shortness of breath, cough or sputum.  GASTROINTESTINAL: No anorexia, nausea, vomiting or diarrhea. No abdominal pain or blood.  GENITOURINARY: No burning on urination, no polyuria NEUROLOGICAL: No headache, dizziness, syncope, paralysis, ataxia, numbness or tingling in the extremities. No change in bowel or bladder control.  MUSCULOSKELETAL: No muscle, back pain, joint pain or stiffness.  LYMPHATICS: No enlarged nodes. No history of splenectomy.  PSYCHIATRIC: No history of depression or anxiety.  ENDOCRINOLOGIC: No reports of sweating, cold or heat intolerance. No polyuria or polydipsia.  Marland Kitchen   Physical Examination There were no vitals filed for this visit. There were no vitals filed for this visit.  Gen: resting comfortably, no acute distress HEENT: no scleral icterus, pupils equal round and reactive, no palptable cervical adenopathy,  CV Resp: Clear to auscultation bilaterally GI: abdomen is soft, non-tender, non-distended,  normal bowel sounds, no hepatosplenomegaly MSK: extremities are warm, no edema.  Skin: warm, no rash Neuro:  no focal deficits Psych: appropriate affect   Diagnostic Studies  Echocardiogram 04/05/2019 [facility]):  Summary   1. The left ventricle is normal in size with mildly increased wall thickness.   2. The left ventricular systolic function is normal, LVEF is visually estimated at > 55%. Grade 2 diastolic dysfunction.   3. The left atrium is mildly dilated in size.   4. The right ventricle is mildly dilated in size, with normal systolic function.   5. The right atrium is mildly dilated  in size.     08/2016 nuclear stress There was no ST segment deviation noted during stress. The study is normal. This is a low risk study. Nuclear stress EF: 68%.     04/2020 30 day monitor - no arrhytnmias     08/2020 nuclear stress No diagnostic ST segment changes to indicate ischemia. Very small, mild intensity, apical anteroseptal defect that is reversible and consistent with either variable breast attenuation or small ischemic territory. This is an intermediate risk study based on calculated ejection fraction, otherwise low risk in  terms of perfusion imaging. Suggest echocardiogram for corroboration. Nuclear stress EF: 45%. There was no ST segment deviation noted during stress.   Assessment and Plan   1. CAD  - chronic chest pains, recent nuclear stress without significant ischemia - pain can improve with antacid but also NG - trial of imdur 15mg  daily, perhaps vasospasm or microvasc disease.  - EKG shows SR, no acute ischemic changes   2. Acute on chronic diastolic HF - volume up, increase lasix to 60mg  daily. Check bmet/mg/bnp in 2 weeks   3. HTN - elevated bps, monitor with additional diuresis for now     Antoine Poche, M.D., F.A.C.C.

## 2023-02-26 ENCOUNTER — Encounter: Payer: 59 | Attending: Registered Nurse | Admitting: Registered Nurse

## 2023-02-26 ENCOUNTER — Encounter: Payer: Self-pay | Admitting: Registered Nurse

## 2023-02-26 VITALS — BP 111/70 | HR 83 | Ht 64.0 in | Wt 238.0 lb

## 2023-02-26 DIAGNOSIS — Z5181 Encounter for therapeutic drug level monitoring: Secondary | ICD-10-CM

## 2023-02-26 DIAGNOSIS — M25512 Pain in left shoulder: Secondary | ICD-10-CM | POA: Diagnosis not present

## 2023-02-26 DIAGNOSIS — Z79891 Long term (current) use of opiate analgesic: Secondary | ICD-10-CM | POA: Diagnosis not present

## 2023-02-26 DIAGNOSIS — M1711 Unilateral primary osteoarthritis, right knee: Secondary | ICD-10-CM | POA: Diagnosis not present

## 2023-02-26 DIAGNOSIS — M7061 Trochanteric bursitis, right hip: Secondary | ICD-10-CM

## 2023-02-26 DIAGNOSIS — M48062 Spinal stenosis, lumbar region with neurogenic claudication: Secondary | ICD-10-CM

## 2023-02-26 DIAGNOSIS — M25511 Pain in right shoulder: Secondary | ICD-10-CM | POA: Insufficient documentation

## 2023-02-26 DIAGNOSIS — M5416 Radiculopathy, lumbar region: Secondary | ICD-10-CM | POA: Diagnosis not present

## 2023-02-26 DIAGNOSIS — M1712 Unilateral primary osteoarthritis, left knee: Secondary | ICD-10-CM | POA: Diagnosis not present

## 2023-02-26 DIAGNOSIS — M7062 Trochanteric bursitis, left hip: Secondary | ICD-10-CM | POA: Diagnosis not present

## 2023-02-26 DIAGNOSIS — M5412 Radiculopathy, cervical region: Secondary | ICD-10-CM

## 2023-02-26 DIAGNOSIS — G8929 Other chronic pain: Secondary | ICD-10-CM

## 2023-02-26 DIAGNOSIS — M542 Cervicalgia: Secondary | ICD-10-CM | POA: Diagnosis not present

## 2023-02-26 DIAGNOSIS — G894 Chronic pain syndrome: Secondary | ICD-10-CM | POA: Diagnosis not present

## 2023-02-26 MED ORDER — OXYCODONE-ACETAMINOPHEN 10-325 MG PO TABS: 1.0000 | ORAL_TABLET | Freq: Four times a day (QID) | ORAL | 0 refills | Status: DC | PRN

## 2023-02-26 NOTE — Progress Notes (Signed)
Subjective:    Patient ID: [name], female    DOB: 05/16/1950, 72 y.o.   MRN: [id number]  HPI: [name] is a 72 y.o. female who returns for follow up appointment for chronic pain and medication refill. She states her pain is located in her neck radiating into her bilateral shoulders, lower back pain radiating into her bilateral lower extremities, bilateral hip pain and bilateral knee pain. She rates her pain 6. Her current exercise regime is walking and performing stretching exercises.  Ms. Vanwormer Morphine equivalent is 60.00 MME.She is also prescribed Alprazolam  by [name] .We have discussed the black box warning of using opioids and benzodiazepines. I highlighted the dangers of using these drugs together and discussed the adverse events including respiratory suppression, overdose, cognitive impairment and importance of compliance with current regimen. We will continue to monitor and adjust as indicated.   Oral Swab was Performed today.     Pain Inventory Average Pain 6 Pain Right Now 6 My pain is sharp, burning, dull, stabbing, tingling, and aching  In the last 24 hours, has pain interfered with the following? General activity 7 Relation with others 5 Enjoyment of life 7 What TIME of day is your pain at its worst? morning  and night Sleep (in general) Fair  Pain is worse with: walking, bending, sitting, standing, and some activites Pain improves with: medication Relief from Meds: 2  Family History  Problem Relation Age of Onset   Heart attack Mother    Colon cancer Mother    Heart attack Father    Stroke Sister    Hemophilia Child    Cancer Other    Coronary artery disease Other    Cancer Brother    Cancer Maternal Aunt    Cancer Maternal Uncle    Cancer Paternal Aunt    Cancer Paternal Uncle    Cancer Brother    Social History   Socioeconomic History   Marital status: Divorced    Spouse name: Not on file   Number of children: 4   Years of  education: Not on file   Highest education level: Not on file  Occupational History   Occupation: Disabled  Tobacco Use   Smoking status: Never   Smokeless tobacco: Never  Vaping Use   Vaping status: Never Used  Substance and Sexual Activity   Alcohol use: No    Alcohol/week: 0.0 standard drinks of alcohol   Drug use: No   Sexual activity: Not on file  Other Topics Concern   Not on file  Social History Narrative   Patient does not get regular exercise   Denies caffeine use    Social Drivers of Corporate investment banker Strain: Low Risk  (07/21/2022)   Overall Financial Resource Strain (CARDIA)    Difficulty of Paying Living Expenses: Not very hard  Food Insecurity: No Food Insecurity (07/21/2022)   Hunger Vital Sign    Worried About Running Out of Food in the Last Year: Never true    Ran Out of Food in the Last Year: Never true  Transportation Needs: No Transportation Needs (07/21/2022)   PRAPARE - Administrator, Civil Service (Medical): No    Lack of Transportation (Non-Medical): No  Physical Activity: Inactive (07/21/2022)   Exercise Vital Sign    Days of Exercise per Week: 0 days    Minutes of Exercise per Session: 0 min  Stress: Stress Concern Present (10/18/2018)   Received from  [facility], [facility]   [facility] - Occupational Stress Questionnaire    Feeling of Stress : To some extent  Social Connections: Moderately Integrated (10/18/2018)   Received from [facility], [facility]   Social Connection and Isolation Panel [NHANES]    Frequency of Communication with Friends and Family: More than three times a week    Frequency of Social Gatherings with Friends and Family: More than three times a week    Attends Religious Services: More than 4 times per year    Active Member of [employer] or Organizations: No    Attends Banker Meetings: Never    Marital Status: Living with partner   Past Surgical History:   Procedure Laterality Date   ABDOMINAL HYSTERECTOMY     Arm Surgery Bilateral    due to fall-pt broke both forearms   BIOPSY N/A 07/01/2013   Procedure: BIOPSY;  Surgeon: Malissa Hippo, MD;  Location: [facility];  Service: Endoscopy;  Laterality: N/A;   CHOLECYSTECTOMY     COLONOSCOPY  08/06/2011   Procedure: COLONOSCOPY;  Surgeon: Malissa Hippo, MD;  [facility];  Service: Endoscopy;  Laterality: N/A;  215   COLONOSCOPY WITH PROPOFOL N/A 12/31/2012   Procedure: COLONOSCOPY WITH PROPOFOL;  Surgeon: Malissa Hippo, MD;  Location: [facility];  Service: Endoscopy;  Laterality: N/A;  in cecum at 0814, total withdrawal time   COLONOSCOPY WITH PROPOFOL N/A 02/05/2018   Procedure: COLONOSCOPY WITH PROPOFOL;  Surgeon: Malissa Hippo, MD;  [facility];  Service: Endoscopy;  Laterality: N/A;  1:25   CORONARY ANGIOPLASTY WITH STENT PLACEMENT     CYSTOSCOPY W/ URETERAL STENT PLACEMENT Right 11/10/2019   at [facility]   CYSTOSCOPY WITH RETROGRADE PYELOGRAM, URETEROSCOPY AND STENT PLACEMENT Right 12/22/2019   Procedure: CYSTOSCOPY WITH RIGHT URETERAL STENT REMOVAL; RIGHT RETROGRADE PYELOGRAM,RIGHT URETEROSCOPY;  Surgeon: Emily Gauze, MD;  Location: [facility];  Service: Urology;  Laterality: Right;   ESOPHAGEAL DILATION N/A 06/07/2015   Procedure: ESOPHAGEAL DILATION;  Surgeon: Malissa Hippo, MD;  [facility];  Service: Endoscopy;  Laterality: N/A;   ESOPHAGOGASTRODUODENOSCOPY N/A 06/07/2015   Procedure: ESOPHAGOGASTRODUODENOSCOPY (EGD);  Surgeon: Malissa Hippo, MD;  [facility];  Service: Endoscopy;  Laterality: N/A;  2:45 - moved to 1:00 - Ann to notify pt   ESOPHAGOGASTRODUODENOSCOPY (EGD) WITH PROPOFOL N/A 12/31/2012   Procedure: ESOPHAGOGASTRODUODENOSCOPY (EGD) WITH PROPOFOL;  Surgeon: Malissa Hippo, MD;  [facility]: [facility];  Service: Endoscopy;  Laterality: N/A;  GE junction 37,   ESOPHAGOGASTRODUODENOSCOPY (EGD) WITH PROPOFOL N/A 07/01/2013    Procedure: ESOPHAGOGASTRODUODENOSCOPY (EGD) WITH PROPOFOL;  Surgeon: Malissa Hippo, MD;  [facility]: [facility];  Service: Endoscopy;  Laterality: N/A;  950   MALONEY DILATION N/A 12/31/2012   Procedure: MALONEY DILATION;  Surgeon: Malissa Hippo, MD;  [facility]: [facility];  Service: Endoscopy;  Laterality: N/A;  used a # 54,#56   MALONEY DILATION N/A 07/01/2013   Procedure: MALONEY DILATION;  Surgeon: Malissa Hippo, MD;  [facility]: [facility];  Service: Endoscopy;  Laterality: N/A;  54/58; no heme present   POLYPECTOMY  02/05/2018   Procedure: POLYPECTOMY;  Surgeon: Malissa Hippo, MD;  [facility];  Service: Endoscopy;;  distal rectum (HS)   STONE EXTRACTION WITH BASKET Right 12/22/2019   Procedure: STONE EXTRACTION WITH BASKET;  Surgeon: Emily Gauze, MD;  [facility]: [facility];  Service: Urology;  Laterality:  Right;   Past Surgical History:  Procedure Laterality Date   ABDOMINAL HYSTERECTOMY     Arm Surgery Bilateral    due to fall-pt broke both forearms   BIOPSY N/A 07/01/2013   Procedure: BIOPSY;  Surgeon: Malissa Hippo, MD;  [facility];  Service: Endoscopy;  Laterality: N/A;   CHOLECYSTECTOMY     COLONOSCOPY  08/06/2011   Procedure: COLONOSCOPY;  Surgeon: Malissa Hippo, MD;  [facility];  Service: Endoscopy;  Laterality: N/A;  215   COLONOSCOPY WITH PROPOFOL N/A 12/31/2012   Procedure: COLONOSCOPY WITH PROPOFOL;  Surgeon: Malissa Hippo, MD;  [facility]: [facility];  Service: Endoscopy;  Laterality: N/A;  in cecum at 0814, total withdrawal time   COLONOSCOPY WITH PROPOFOL N/A 02/05/2018   Procedure: COLONOSCOPY WITH PROPOFOL;  Surgeon: Malissa Hippo, MD;  [facility];  Service: Endoscopy;  Laterality: N/A;  1:25   CORONARY ANGIOPLASTY WITH STENT PLACEMENT     CYSTOSCOPY W/ URETERAL STENT PLACEMENT Right 11/10/2019   at [facility]   CYSTOSCOPY WITH RETROGRADE PYELOGRAM, URETEROSCOPY AND STENT PLACEMENT Right 12/22/2019   Procedure: CYSTOSCOPY WITH  RIGHT URETERAL STENT REMOVAL; RIGHT RETROGRADE PYELOGRAM,RIGHT URETEROSCOPY;  Surgeon: Emily Gauze, MD;  [facility]: [facility];  Service: Urology;  Laterality: Right;   ESOPHAGEAL DILATION N/A 06/07/2015   Procedure: ESOPHAGEAL DILATION;  Surgeon: Malissa Hippo, MD;  [facility];  Service: Endoscopy;  Laterality: N/A;   ESOPHAGOGASTRODUODENOSCOPY N/A 06/07/2015   Procedure: ESOPHAGOGASTRODUODENOSCOPY (EGD);  Surgeon: Malissa Hippo, MD;  [facility];  Service: Endoscopy;  Laterality: N/A;  2:45 - moved to 1:00 - Ann to notify pt   ESOPHAGOGASTRODUODENOSCOPY (EGD) WITH PROPOFOL N/A 12/31/2012   Procedure: ESOPHAGOGASTRODUODENOSCOPY (EGD) WITH PROPOFOL;  Surgeon: Malissa Hippo, MD;  [facility];  Service: Endoscopy;  Laterality: N/A;  GE junction 37,   ESOPHAGOGASTRODUODENOSCOPY (EGD) WITH PROPOFOL N/A 07/01/2013   Procedure: ESOPHAGOGASTRODUODENOSCOPY (EGD) WITH PROPOFOL;  Surgeon: Malissa Hippo, MD;  [facility]: [facility];  Service: Endoscopy;  Laterality: N/A;  950   MALONEY DILATION N/A 12/31/2012   Procedure: MALONEY DILATION;  Surgeon: Malissa Hippo, MD;  [facility]: [facility];  Service: Endoscopy;  Laterality: N/A;  used a # 54,#56   MALONEY DILATION N/A 07/01/2013   Procedure: MALONEY DILATION;  Surgeon: Malissa Hippo, MD;  [facility]: [facility];  Service: Endoscopy;  Laterality: N/A;  54/58; no heme present   POLYPECTOMY  02/05/2018   Procedure: POLYPECTOMY;  Surgeon: Malissa Hippo, MD;  [facility];  Service: Endoscopy;;  distal rectum (HS)   STONE EXTRACTION WITH BASKET Right 12/22/2019   Procedure: STONE EXTRACTION WITH BASKET;  Surgeon: Emily Gauze, MD;  [facility]: [facility];  Service: Urology;  Laterality: Right;   Past Medical History:  Diagnosis Date   Anxiety and depression    Arthritis    CAD (coronary artery disease)    Stent placement circumflex coronary 2007, catheterization 2008 patent stents. 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    High cholesterol    Hx of blood clots    Hypertension    MI (myocardial infarction) (HCC)    2007   Obstructive sleep apnea    CPAP, setting ?2   Palpitations    Sleep apnea    Tachycardia    BP 111/70   Pulse 83   Ht 5\' 4"  (1.626 m)   Wt 238 lb (108 kg)   SpO2 97%   BMI 40.85 kg/m   Opioid Risk Score:   Fall Risk Score:  `1  Depression screen PHQ 2/9     02/26/2023    1:01 PM 01/01/2023    1:51 PM 12/04/2022    2:27 PM 11/04/2022   10:59 AM 10/06/2022    9:48 AM 08/26/2022    1:53 PM 07/28/2022   11:51 AM  Depression screen PHQ 2/9  Decreased Interest 1 0 1 0 1 1 1   Down, Depressed, Hopeless 1 0 1 0 1 1 1   PHQ - 2 Score 2 0 2 0 2 2 2      Review of Systems  Constitutional: Negative.   HENT: Negative.    Eyes: Negative.   Respiratory: Negative.    Cardiovascular: Negative.   Gastrointestinal: Negative.   Endocrine: Negative.   Genitourinary: Negative.   Musculoskeletal:  Positive for arthralgias, back pain, gait problem, myalgias and neck pain.  Skin: Negative.   Allergic/Immunologic: Negative.   Hematological: Negative.   Psychiatric/Behavioral:  Positive for dysphoric mood.   All other systems reviewed and are negative.      Objective:   Physical Exam Vitals and nursing note reviewed.  Constitutional:      Appearance: Normal appearance.  Neck:     Comments: Cervical Paraspinal Tenderness: C-5-C-6  Cardiovascular:     Rate and Rhythm: Normal rate and regular rhythm.     Pulses: Normal pulses.     Heart sounds: Normal heart sounds.  Pulmonary:     Effort: Pulmonary effort is  normal.     Breath sounds: Normal breath sounds.  Musculoskeletal:     Right lower leg: Edema present.     Left lower leg: Edema present.     Comments: Normal Muscle Bulk and Muscle Testing Reveals:  Upper Extremities: Full ROM and Muscle Strength 5/5 Bilateral AC Joint Tenderness  Lumbar Hypersensitivity Bilateral Greater Trochanter Tenderness Lower Extremities: Full ROM and Muscle Strength 5/5 Arises from Table Slowly using Walker for support Narrow Based  Gait     Skin:    General: Skin is warm and dry.  Neurological:     Mental Status: She is alert and oriented to person, place, and time.  Psychiatric:        Mood and Affect: Mood normal.        Behavior: Behavior normal.         Assessment & Plan:  1.Lumbar pain lumbar spondylosis:  Encouraged to continue to increase Activity as tolerated. 02/26/2023 Continue current medication regimen. Refilled Oxycodone 10/325 mg one tablet every 6 hours as needed for pain. #120.  We will continue the opioid monitoring program, this consists of regular clinic visits, examinations, urine drug screen, pill counts as well as use of [place] Controlled Substance Reporting system. A 12 month History has been reviewed on the [place] Controlled Substance Reporting System on 02/26/2023.  2. Lumbar Radiculitis: Continue current medication regimen with Gabapentin. 02/26/2023 3. Bilateral Knee Pain/ Degenerative: Continue Voltaren Gel. Continue to Monitor. 02/26/2023 4. Severe diabetic poly neuropathy:  Continue current medication regimen with Gabapentin. 02/26/2023 5. Insomnia: No complaints today. Continue to Monitor.  02/26/2023 6.Anxiety/Depression: Continue Current Medication regimen as prescribed by PCP and   Psychiatry. 02/26/2023 7. De Quervains Tenosynovitis: No Complaints Today. 02/26/2023 8. Bilateral  Greater Trochanteric Bursitis: Continue with Ice and Heat Therapy. Continue to Monitor. 02/26/2023.  9. Cervicalgia/ Cervical  Radiculitis:Continue Gabapentin. Continue with HEP as Tolerated and continue to monitor. 02/26/2023 10. Chronic Bilateral Thoracic Back Pain: No complaints today. Continue HEP as Tolerated. Continue current medication regimen. Continue to monitor. 02/26/2023.     F/U in 1 month

## 2023-03-02 LAB — DRUG TOX MONITOR 1 W/CONF, ORAL FLD
Amphetamines: NEGATIVE ng/mL (ref <10)
Barbiturates: NEGATIVE ng/mL (ref <10)
Benzodiazepines: NEGATIVE ng/mL (ref <0.50)
Buprenorphine: NEGATIVE ng/mL (ref <0.10)
Cocaine: NEGATIVE ng/mL (ref <5.0)
Codeine: NEGATIVE ng/mL (ref <2.5)
Dihydrocodeine: NEGATIVE ng/mL (ref <2.5)
Fentanyl: NEGATIVE ng/mL (ref <0.10)
Heroin Metabolite: NEGATIVE ng/mL (ref <1.0)
Hydrocodone: NEGATIVE ng/mL (ref <2.5)
Hydromorphone: NEGATIVE ng/mL (ref <2.5)
MARIJUANA: NEGATIVE ng/mL (ref <2.5)
MDMA: NEGATIVE ng/mL (ref <10)
Meprobamate: NEGATIVE ng/mL (ref <2.5)
Methadone: NEGATIVE ng/mL (ref <5.0)
Morphine: NEGATIVE ng/mL (ref <2.5)
Nicotine Metabolite: NEGATIVE ng/mL (ref <5.0)
Norhydrocodone: NEGATIVE ng/mL (ref <2.5)
Noroxycodone: 5.1 ng/mL — ABNORMAL HIGH (ref <2.5)
Opiates: POSITIVE ng/mL — AB (ref <2.5)
Oxycodone: 22 ng/mL — ABNORMAL HIGH (ref <2.5)
Oxymorphone: NEGATIVE ng/mL (ref <2.5)
Phencyclidine: NEGATIVE ng/mL (ref <10)
Tapentadol: NEGATIVE ng/mL (ref <5.0)
Tramadol: NEGATIVE ng/mL (ref <5.0)
Zolpidem: NEGATIVE ng/mL (ref <5.0)

## 2023-03-02 LAB — DRUG TOX ALC METAB W/CON, ORAL FLD: Alcohol Metabolite: NEGATIVE ng/mL (ref <25)

## 2023-03-13 ENCOUNTER — Telehealth: Payer: Self-pay | Admitting: Nurse Practitioner

## 2023-03-13 NOTE — Telephone Encounter (Signed)
Called and spoke with patient regarding paperwork received from [employer] regarding potential clinical concern of drug-disease interaction: Pancreatitis and GLP-1 agonist.  Patient tells me she is doing well, but has been out of Ozempic for the past month. No history of pancreatitis.   She denies any personal or family history of thyroid cancer as well as pancreatic cancer initially, however after further review in the chart patient does confirm with me that her brother did have pancreatic cancer-unsure what kind.  As per my last progress note, patient previously denied any family or personal history of thyroid/pancreatic cancer at last office visit.  I told her to stop this medication and speak to her doctor, PCP, about other options.  She verbalized understanding.  Sharlene Dory, NP

## 2023-03-13 NOTE — Addendum Note (Signed)
Addended by: Sharlene Dory on: 03/13/2023 05:27 PM   Modules accepted: Orders

## 2023-03-16 DIAGNOSIS — M797 Fibromyalgia: Secondary | ICD-10-CM | POA: Diagnosis not present

## 2023-03-16 DIAGNOSIS — R0902 Hypoxemia: Secondary | ICD-10-CM | POA: Diagnosis not present

## 2023-03-16 DIAGNOSIS — M159 Polyosteoarthritis, unspecified: Secondary | ICD-10-CM | POA: Diagnosis not present

## 2023-03-16 DIAGNOSIS — I509 Heart failure, unspecified: Secondary | ICD-10-CM | POA: Diagnosis not present

## 2023-03-16 DIAGNOSIS — G4733 Obstructive sleep apnea (adult) (pediatric): Secondary | ICD-10-CM | POA: Diagnosis not present

## 2023-03-20 DIAGNOSIS — Z299 Encounter for prophylactic measures, unspecified: Secondary | ICD-10-CM | POA: Diagnosis not present

## 2023-03-20 DIAGNOSIS — D6869 Other thrombophilia: Secondary | ICD-10-CM | POA: Diagnosis not present

## 2023-03-20 DIAGNOSIS — E1143 Type 2 diabetes mellitus with diabetic autonomic (poly)neuropathy: Secondary | ICD-10-CM | POA: Diagnosis not present

## 2023-03-20 DIAGNOSIS — I700 Atherosclerosis of aorta: Secondary | ICD-10-CM | POA: Diagnosis not present

## 2023-03-20 DIAGNOSIS — I10 Essential (primary) hypertension: Secondary | ICD-10-CM | POA: Diagnosis not present

## 2023-03-20 DIAGNOSIS — K3184 Gastroparesis: Secondary | ICD-10-CM | POA: Diagnosis not present

## 2023-03-21 ENCOUNTER — Other Ambulatory Visit: Payer: Self-pay | Admitting: "Endocrinology

## 2023-03-24 ENCOUNTER — Encounter: Payer: 59 | Admitting: Registered Nurse

## 2023-03-24 ENCOUNTER — Other Ambulatory Visit: Payer: Self-pay | Admitting: Nurse Practitioner

## 2023-03-24 DIAGNOSIS — E1165 Type 2 diabetes mellitus with hyperglycemia: Secondary | ICD-10-CM

## 2023-03-25 ENCOUNTER — Encounter: Payer: 59 | Attending: Registered Nurse | Admitting: Registered Nurse

## 2023-03-25 ENCOUNTER — Encounter: Payer: Self-pay | Admitting: Registered Nurse

## 2023-03-25 VITALS — BP 113/74 | HR 80 | Ht 64.0 in | Wt 242.4 lb

## 2023-03-25 DIAGNOSIS — Z79891 Long term (current) use of opiate analgesic: Secondary | ICD-10-CM | POA: Diagnosis not present

## 2023-03-25 DIAGNOSIS — M542 Cervicalgia: Secondary | ICD-10-CM

## 2023-03-25 DIAGNOSIS — M48062 Spinal stenosis, lumbar region with neurogenic claudication: Secondary | ICD-10-CM | POA: Diagnosis not present

## 2023-03-25 DIAGNOSIS — G8929 Other chronic pain: Secondary | ICD-10-CM | POA: Diagnosis not present

## 2023-03-25 DIAGNOSIS — M1712 Unilateral primary osteoarthritis, left knee: Secondary | ICD-10-CM | POA: Diagnosis not present

## 2023-03-25 DIAGNOSIS — M25511 Pain in right shoulder: Secondary | ICD-10-CM | POA: Diagnosis not present

## 2023-03-25 DIAGNOSIS — M7061 Trochanteric bursitis, right hip: Secondary | ICD-10-CM

## 2023-03-25 DIAGNOSIS — G894 Chronic pain syndrome: Secondary | ICD-10-CM

## 2023-03-25 DIAGNOSIS — M5412 Radiculopathy, cervical region: Secondary | ICD-10-CM | POA: Diagnosis not present

## 2023-03-25 DIAGNOSIS — M25512 Pain in left shoulder: Secondary | ICD-10-CM | POA: Insufficient documentation

## 2023-03-25 DIAGNOSIS — Z5181 Encounter for therapeutic drug level monitoring: Secondary | ICD-10-CM | POA: Diagnosis not present

## 2023-03-25 DIAGNOSIS — M1711 Unilateral primary osteoarthritis, right knee: Secondary | ICD-10-CM

## 2023-03-25 DIAGNOSIS — M5416 Radiculopathy, lumbar region: Secondary | ICD-10-CM

## 2023-03-25 DIAGNOSIS — M7062 Trochanteric bursitis, left hip: Secondary | ICD-10-CM | POA: Insufficient documentation

## 2023-03-25 MED ORDER — OXYCODONE-ACETAMINOPHEN 10-325 MG PO TABS: 1.0000 | ORAL_TABLET | Freq: Four times a day (QID) | ORAL | 0 refills | Status: DC | PRN

## 2023-03-25 NOTE — Progress Notes (Addendum)
 Subjective:    Patient ID: [name], female    DOB: 04-May-1950, 73 y.o.   MRN: [id number]  HPI: [name] is a 73 y.o. female who returns for follow up appointment for chronic pain and medication refill. She states her pain is located in her neck radiating into her bilateral shoulders, upper- lower back radiating into her bilateral hips and bilateral lower extremities. She also reports bilateral knee pain. She rates her pain 5. Her current exercise regime is walking and performing stretching exercises.  [name] Morphine  equivalent is 60.00 MME. She is also prescribed Alprazolam   by Dr. Rosamond .We have discussed the black box warning of using opioids and benzodiazepines. I highlighted the dangers of using these drugs together and discussed the adverse events including respiratory suppression, overdose, cognitive impairment and importance of compliance with current regimen. We will continue to monitor and adjust as indicated.     Last Oral Swab was Performed on 02/26/2023, it was consistent.     Pain Inventory Average Pain 6 Pain Right Now 5 My pain is sharp, burning, dull, stabbing, tingling, and aching  In the last 24 hours, has pain interfered with the following? General activity 7 Relation with others 5 Enjoyment of life 7 What TIME of day is your pain at its worst? morning  and night Sleep (in general) Fair  Pain is worse with: walking, bending, sitting, standing, and some activites Pain improves with: medication Relief from Meds: 2  Family History  Problem Relation Age of Onset   Heart attack Mother    Colon cancer Mother    Heart attack Father    Stroke Sister    Hemophilia Child    Cancer Other    Coronary artery disease Other    Cancer Brother    Cancer Maternal Aunt    Cancer Maternal Uncle    Cancer Paternal Aunt    Cancer Paternal Uncle    Cancer Brother    Social History   Socioeconomic History   Marital status: Divorced    Spouse name: Not on  file   Number of children: 4   Years of education: Not on file   Highest education level: Not on file  Occupational History   Occupation: Disabled  Tobacco Use   Smoking status: Never   Smokeless tobacco: Never  Vaping Use   Vaping status: Never Used  Substance and Sexual Activity   Alcohol  use: No    Alcohol /week: 0.0 standard drinks of alcohol    Drug use: No   Sexual activity: Not on file  Other Topics Concern   Not on file  Social History Narrative   Patient does not get regular exercise   Denies caffeine use    Social Drivers of Corporate Investment Banker Strain: Low Risk  (07/21/2022)   Overall Financial Resource Strain (CARDIA)    Difficulty of Paying Living Expenses: Not very hard  Food Insecurity: No Food Insecurity (07/21/2022)   Hunger Vital Sign    Worried About Running Out of Food in the Last Year: Never true    Ran Out of Food in the Last Year: Never true  Transportation Needs: No Transportation Needs (07/21/2022)   PRAPARE - Administrator, Civil Service (Medical): No    Lack of Transportation (Non-Medical): No  Physical Activity: Inactive (07/21/2022)   Exercise Vital Sign    Days of Exercise per Week: 0 days    Minutes of Exercise per Session: 0 min  Stress: Stress Concern Present (10/18/2018)   Received from [facility], [facility]   [facility] - Occupational Stress Questionnaire    Feeling of Stress : To some extent  Social Connections: Moderately Integrated (10/18/2018)   Received from [facility], [facility]   Social Connection and Isolation Panel [NHANES]    Frequency of Communication with Friends and Family: More than three times a week    Frequency of Social Gatherings with Friends and Family: More than three times a week    Attends Religious Services: More than 4 times per year    Active Member of [employer] or Organizations: No    Attends Banker Meetings: Never    Marital Status:  Living with partner   Past Surgical History:  Procedure Laterality Date   ABDOMINAL HYSTERECTOMY     Arm Surgery Bilateral    due to fall-pt broke both forearms   BIOPSY N/A 07/01/2013   Procedure: BIOPSY;  Surgeon: Claudis RAYMOND Rivet, MD;  Location: [facility];  Service: Endoscopy;  Laterality: N/A;   CHOLECYSTECTOMY     COLONOSCOPY  08/06/2011   Procedure: COLONOSCOPY;  Surgeon: Claudis RAYMOND Rivet, MD;  [facility];  Service: Endoscopy;  Laterality: N/A;  215   COLONOSCOPY WITH PROPOFOL  N/A 12/31/2012   Procedure: COLONOSCOPY WITH PROPOFOL ;  Surgeon: Claudis RAYMOND Rivet, MD;  Location: [facility];  Service: Endoscopy;  Laterality: N/A;  in cecum at 0814, total withdrawal time   COLONOSCOPY WITH PROPOFOL  N/A 02/05/2018   Procedure: COLONOSCOPY WITH PROPOFOL ;  Surgeon: Rivet Claudis RAYMOND, MD;  [facility];  Service: Endoscopy;  Laterality: N/A;  1:25   CORONARY ANGIOPLASTY WITH STENT PLACEMENT     CYSTOSCOPY W/ URETERAL STENT PLACEMENT Right 11/10/2019   at [facility]   CYSTOSCOPY WITH RETROGRADE PYELOGRAM, URETEROSCOPY AND STENT PLACEMENT Right 12/22/2019   Procedure: CYSTOSCOPY WITH RIGHT URETERAL STENT REMOVAL; RIGHT RETROGRADE PYELOGRAM,RIGHT URETEROSCOPY;  Surgeon: Sherrilee Belvie CROME, MD;  Location: [facility];  Service: Urology;  Laterality: Right;   ESOPHAGEAL DILATION N/A 06/07/2015   Procedure: ESOPHAGEAL DILATION;  Surgeon: Claudis RAYMOND Rivet, MD;  [facility];  Service: Endoscopy;  Laterality: N/A;   ESOPHAGOGASTRODUODENOSCOPY N/A 06/07/2015   Procedure: ESOPHAGOGASTRODUODENOSCOPY (EGD);  Surgeon: Claudis RAYMOND Rivet, MD;  [facility];  Service: Endoscopy;  Laterality: N/A;  2:45 - moved to 1:00 - Ann to notify pt   ESOPHAGOGASTRODUODENOSCOPY (EGD) WITH PROPOFOL  N/A 12/31/2012   Procedure: ESOPHAGOGASTRODUODENOSCOPY (EGD) WITH PROPOFOL ;  Surgeon: Claudis RAYMOND Rivet, MD;  [facility]: [facility];  Service: Endoscopy;  Laterality: N/A;  GE junction 37,    ESOPHAGOGASTRODUODENOSCOPY (EGD) WITH PROPOFOL  N/A 07/01/2013   Procedure: ESOPHAGOGASTRODUODENOSCOPY (EGD) WITH PROPOFOL ;  Surgeon: Claudis RAYMOND Rivet, MD;  [facility]: [facility];  Service: Endoscopy;  Laterality: N/A;  950   MALONEY DILATION N/A 12/31/2012   Procedure: MALONEY DILATION;  Surgeon: Claudis RAYMOND Rivet, MD;  [facility]: [facility];  Service: Endoscopy;  Laterality: N/A;  used a # 54,#56   MALONEY DILATION N/A 07/01/2013   Procedure: MALONEY DILATION;  Surgeon: Claudis RAYMOND Rivet, MD;  [facility]: [facility];  Service: Endoscopy;  Laterality: N/A;  54/58; no heme present   POLYPECTOMY  02/05/2018   Procedure: POLYPECTOMY;  Surgeon: Rivet Claudis RAYMOND, MD;  [facility];  Service: Endoscopy;;  distal rectum (HS)   STONE EXTRACTION WITH BASKET Right 12/22/2019   Procedure: STONE EXTRACTION WITH BASKET;  Surgeon: Sherrilee Belvie CROME, MD;  [facility]: [facility];  Service: Urology;  Laterality: Right;   Past Surgical History:  Procedure Laterality Date   ABDOMINAL HYSTERECTOMY     Arm Surgery Bilateral    due to fall-pt broke both forearms   BIOPSY N/A 07/01/2013   Procedure: BIOPSY;  Surgeon: Claudis RAYMOND Rivet, MD;  [facility]: [facility];  Service: Endoscopy;  Laterality: N/A;   CHOLECYSTECTOMY     COLONOSCOPY  08/06/2011   Procedure: COLONOSCOPY;  Surgeon: Claudis RAYMOND Rivet, MD;  [facility];  Service: Endoscopy;  Laterality: N/A;  215   COLONOSCOPY WITH PROPOFOL  N/A 12/31/2012   Procedure: COLONOSCOPY WITH PROPOFOL ;  Surgeon: Claudis RAYMOND Rivet, MD;  [facility]: [facility];  Service: Endoscopy;  Laterality: N/A;  in cecum at 0814, total withdrawal time   COLONOSCOPY WITH PROPOFOL  N/A 02/05/2018   Procedure: COLONOSCOPY WITH PROPOFOL ;  Surgeon: Rivet Claudis RAYMOND, MD;  [facility];  Service: Endoscopy;  Laterality: N/A;  1:25   CORONARY ANGIOPLASTY WITH STENT PLACEMENT     CYSTOSCOPY W/ URETERAL STENT PLACEMENT Right 11/10/2019   at [facility]   CYSTOSCOPY WITH RETROGRADE PYELOGRAM, URETEROSCOPY AND  STENT PLACEMENT Right 12/22/2019   Procedure: CYSTOSCOPY WITH RIGHT URETERAL STENT REMOVAL; RIGHT RETROGRADE PYELOGRAM,RIGHT URETEROSCOPY;  Surgeon: Sherrilee Belvie CROME, MD;  [facility]: [facility];  Service: Urology;  Laterality: Right;   ESOPHAGEAL DILATION N/A 06/07/2015   Procedure: ESOPHAGEAL DILATION;  Surgeon: Claudis RAYMOND Rivet, MD;  [facility];  Service: Endoscopy;  Laterality: N/A;   ESOPHAGOGASTRODUODENOSCOPY N/A 06/07/2015   Procedure: ESOPHAGOGASTRODUODENOSCOPY (EGD);  Surgeon: Claudis RAYMOND Rivet, MD;  [facility];  Service: Endoscopy;  Laterality: N/A;  2:45 - moved to 1:00 - Ann to notify pt   ESOPHAGOGASTRODUODENOSCOPY (EGD) WITH PROPOFOL  N/A 12/31/2012   Procedure: ESOPHAGOGASTRODUODENOSCOPY (EGD) WITH PROPOFOL ;  Surgeon: Claudis RAYMOND Rivet, MD;  [facility]: [facility];  Service: Endoscopy;  Laterality: N/A;  GE junction 37,   ESOPHAGOGASTRODUODENOSCOPY (EGD) WITH PROPOFOL  N/A 07/01/2013   Procedure: ESOPHAGOGASTRODUODENOSCOPY (EGD) WITH PROPOFOL ;  Surgeon: Claudis RAYMOND Rivet, MD;  [facility]: [facility];  Service: Endoscopy;  Laterality: N/A;  950   MALONEY DILATION N/A 12/31/2012   Procedure: MALONEY DILATION;  Surgeon: Claudis RAYMOND Rivet, MD;  [facility]: [facility];  Service: Endoscopy;  Laterality: N/A;  used a # 54,#56   MALONEY DILATION N/A 07/01/2013   Procedure: MALONEY DILATION;  Surgeon: Claudis RAYMOND Rivet, MD;  [facility]: [facility];  Service: Endoscopy;  Laterality: N/A;  54/58; no heme present   POLYPECTOMY  02/05/2018   Procedure: POLYPECTOMY;  Surgeon: Rivet Claudis RAYMOND, MD;  [facility];  Service: Endoscopy;;  distal rectum (HS)   STONE EXTRACTION WITH BASKET Right 12/22/2019   Procedure: STONE EXTRACTION WITH BASKET;  Surgeon: Sherrilee Belvie CROME, MD;  Location: [facility];  Service: Urology;  Laterality: Right;   Past Medical History:  Diagnosis Date   Anxiety and depression    Arthritis    CAD (coronary artery disease)    Stent placement circumflex coronary 2007,  catheterization 2008 patent stents. 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    High cholesterol    Hx of blood clots    Hypertension    MI (myocardial infarction) (HCC)    2007   Obstructive sleep apnea    CPAP, setting ?2   Palpitations    Sleep apnea    Tachycardia    BP 113/74   Pulse 80   Ht 5' 4 (1.626 m)   Wt 242 lb 6.4 oz (110 kg)   SpO2 95%   BMI 41.61 kg/m   Opioid Risk Score:   Fall Risk Score:  `1  Depression screen PHQ 2/9     03/25/2023    2:24 PM 02/26/2023    1:01 PM 01/01/2023    1:51 PM 12/04/2022    2:27 PM 11/04/2022   10:59 AM 10/06/2022    9:48 AM 08/26/2022    1:53 PM  Depression screen PHQ 2/9  Decreased Interest 1 1 0 1 0 1 1  Down, Depressed, Hopeless 1 1 0 1 0 1 1  PHQ - 2 Score 2 2 0 2 0 2 2     Review of Systems  All other systems reviewed and are negative.      Objective:   Physical Exam Vitals and nursing note reviewed.  Constitutional:      Appearance: Normal appearance.  Neck:     Comments: Cervical Paraspinal Tenderness: C-5-C-6 Cardiovascular:     Rate and Rhythm: Normal rate and regular rhythm.     Pulses: Normal pulses.     Heart sounds: Normal heart sounds.  Pulmonary:     Effort: Pulmonary effort is normal.     Breath sounds: Normal breath sounds.  Musculoskeletal:     Comments: Normal Muscle Bulk and Muscle Testing Reveals:  Upper Extremities: Full ROM and Muscle Strength 5/5 Bilateral AC Joint Tenderness  Thoracic and Lumbar Hypersensitivity Bilateral Greater Trochanter Tenderness Lower Extremities: Full ROM and Muscle Strength 5/5 Bilateral Lower  Extremity Flexion Produces Pain into her Bilateral Patella's Arises from Table  Slowly using walker for support Antalgic Gait     Skin:    General: Skin is warm and dry.  Neurological:     Mental Status: She is alert and oriented to person, place, and time.  Psychiatric:        Mood and Affect: Mood normal.        Behavior: Behavior normal.         Assessment & Plan:  1.Lumbar pain lumbar spondylosis:  Encouraged to continue to increase Activity as tolerated. 01/ 10/2023 Continue current medication regimen. Refilled Oxycodone  10/325 mg one tablet every 6 hours as needed for pain. #120.  We will continue the opioid monitoring program, this consists of regular clinic visits, examinations, urine drug screen, pill counts as well as use of [place]  Controlled Substance Reporting system. A 12 month History has been reviewed on the [place]  Controlled Substance Reporting System on 03/25/2023.  2. Lumbar Radiculitis: Continue current medication regimen with Gabapentin . 03/25/2023 3. Bilateral Knee Pain/ Degenerative: Continue Voltaren  Gel. Continue to Monitor. 03/25/2023 4. Severe diabetic poly neuropathy: Continue current medication regimen with Gabapentin . 03/25/2023 5. Insomnia: No complaints today. Continue to Monitor.  03/25/2023 6.Anxiety/Depression: Continue Current Medication regimen as prescribed by PCP. SABRA 03/25/2023 7. De Quervains Tenosynovitis: No Complaints Today. 01/108/2025 8. Bilateral  Greater Trochanteric Bursitis: Continue with Ice and Heat Therapy. Continue to Monitor. 03/25/2023.  9. Cervicalgia/ Cervical Radiculitis:Continue Gabapentin . Continue with HEP as Tolerated and continue to  monitor. 03/25/2023 10. Chronic Bilateral Thoracic Back Pain:  Continue HEP as Tolerated. Continue current medication regimen. Continue to monitor. 03/25/2023.     F/U in 1 month

## 2023-03-26 ENCOUNTER — Ambulatory Visit (INDEPENDENT_AMBULATORY_CARE_PROVIDER_SITE_OTHER): Payer: 59 | Admitting: Primary Care

## 2023-03-26 ENCOUNTER — Encounter: Payer: Self-pay | Admitting: Primary Care

## 2023-03-26 VITALS — BP 112/60 | HR 75 | Temp 97.8°F | Ht 64.0 in | Wt 247.2 lb

## 2023-03-26 DIAGNOSIS — G4734 Idiopathic sleep related nonobstructive alveolar hypoventilation: Secondary | ICD-10-CM | POA: Diagnosis not present

## 2023-03-26 DIAGNOSIS — Z8669 Personal history of other diseases of the nervous system and sense organs: Secondary | ICD-10-CM | POA: Diagnosis not present

## 2023-03-26 DIAGNOSIS — R0683 Snoring: Secondary | ICD-10-CM | POA: Diagnosis not present

## 2023-03-26 NOTE — Patient Instructions (Addendum)
-  OBSTRUCTIVE SLEEP APNEA (OSA): Obstructive Sleep Apnea (OSA) is a condition where the airway becomes blocked during sleep, causing breathing to stop and start repeatedly. We will schedule an in-lab split night sleep study to assess the current severity of your OSA and determine the need for CPAP therapy. Continue using oxygen  at night until the sleep study is completed. Try to sleep on your side or use a wedge pillow to help reduce apnea events. We will follow up in 6-8 weeks to discuss the results and potential initiation of CPAP therapy.  -INSOMNIA: Insomnia is difficulty falling or staying asleep. You are currently taking a sleep medication that seems to help you fall asleep. Continue with this medication for now. We will address this further if your sleep quality does not improve after managing your OSA.  -WEIGHT MANAGEMENT: Maintaining a healthy weight can help improve symptoms of OSA. You have been stable in your weight but recently lost 20 pounds after starting Ozempic , which was later discontinued. Continue your efforts to lose weight as it can positively impact your OSA symptoms.  -GENERAL HEALTH MAINTENANCE: Continue using your nightly oxygen  until the sleep study is completed. Avoid driving when you are tired and do not consume alcohol  before bedtime for your safety.  INSTRUCTIONS: Please schedule an in-lab split night sleep study as soon as possible. Continue using oxygen  at night and try to sleep on your side or use a wedge pillow.   Follow-up Schedule visit in 6-8 weeks to discuss the results of your sleep study and the potential initiation of CPAP therapy.

## 2023-03-26 NOTE — Progress Notes (Signed)
 @Patient  ID: [name], female    DOB: Feb 25, 1951, 73 y.o.   MRN: [id number]  Chief Complaint  Patient presents with   Consult    Sleep Consult-self referral. Loud snoring, sob in am    Referring provider: Rosamond Leta NOVAK, MD  HPI: 73 year old female, smoked.  Past medical history significant for hypertension, TIA, type 2 diabetes, coronary artery disease, obstructive sleep apnea.  03/26/2023 Discussed the use of AI scribe software for clinical note transcription with the patient, who gave verbal consent to proceed.  History of Present Illness   The patient, with a known history of sleep apnea, presents for a sleep consult. She reports having been on CPAP therapy twice in the past, but discontinued use approximately two to three years ago following a hospitalization for COVID-19. The patient has been experiencing difficulty sleeping due to severe snoring, which has been disruptive to her partner. She expresses willingness to resume CPAP therapy.  The patient reports difficulty falling asleep, taking approximately 30 to 60 minutes to do so after turning off the television at around 10:30 PM. She also reports frequent awakenings during the night, approximately three to four times, due to both snoring and the need to urinate. She has been taking a sleep aid at night, but the name of the medication is not specified.  The patient has a history of diabetes and is on 2 liters of nocturnal oxygen . She reports a total sleep duration of approximately six hours per night, with occasional daytime napping. The patient's weight has been stable over the past two years, but she has recently lost approximately 20 pounds after starting Ozempic , which was subsequently discontinued by her cardiologist. The patient has been attempting to manage her sleep apnea symptoms at home by sleeping on her side and occasionally using a wedge pillow. She does not consume alcohol  and is unable to drive due to unspecified  reasons.        Allergies  Allergen Reactions   Amphetamine-Dextroamphetamine Swelling   Nitrofuran Derivatives Itching and Swelling   Amphetamine-Dextroamphet Er Swelling   Pregabalin Swelling   Topiramate  Other (See Comments)    Tongue tingle   Verelan [Verapamil] Rash    Immunization History  Administered Date(s) Administered   Fluad Quad(high Dose 65+) 12/16/2022    Past Medical History:  Diagnosis Date   Anxiety and depression    Arthritis    CAD (coronary artery disease)    Stent placement circumflex coronary 2007, catheterization 2008 patent stents. 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     acetaminophen  (TYLENOL ) 325 MG tablet Take 2 tablets (650 mg total) by mouth every 4 (four) hours as needed for mild pain (or temp > 37.5 C (99.5 F)). 100 tablet 4   ALPRAZolam  (XANAX ) 0.5 MG tablet Take 1 tablet (0.5 mg total) by mouth 2 (two) times daily as needed for anxiety or sleep. 12 tablet 0   aspirin  EC 81 MG tablet Take 1 tablet (81 mg total) by mouth daily with breakfast. -Take Aspirin  81 mg daily along with Plavix  75 mg daily for 21 days then after that  STOP the Plavix   and continue ONLY Aspirin  81 mg daily indefinitely-- 30 tablet 11   atorvastatin  (LIPITOR) 40 MG tablet Take 40 mg by mouth daily.     B-D ULTRAFINE III SHORT PEN 31G X 8 MM MISC Inject into the skin 4 (four) times daily.     Blood Glucose Monitoring Suppl (ACCU-CHEK GUIDE ME) w/Device KIT 1 Piece by Does not apply route as directed. 1 kit 0   buPROPion  ER (WELLBUTRIN  SR) 100 MG 12 hr tablet Take 1 tablet (100 mg total) by mouth daily. 30 tablet 2   Cholecalciferol  (VITAMIN D3) 125 MCG (5000 UT) CAPS TAKE ONE CAPSULE BY MOUTH DAILY (Patient taking differently: Take 5,000 Units by mouth daily.) 90 capsule 0   diclofenac  Sodium (VOLTAREN ) 1 % GEL Apply topically.     doxepin  (SINEQUAN ) 25 MG capsule Take 75 mg by mouth at bedtime.      FEROSUL 325 (65 Fe) MG tablet Take 325 mg by mouth every morning.     furosemide  (LASIX ) 40 MG tablet TAKE 1 AND 1/2 TABLETS BY MOUTH DAILY 135 tablet 2   glipiZIDE  (GLUCOTROL  XL) 5 MG 24 hr tablet Take 5 mg by mouth daily.     glucose blood (ACCU-CHEK GUIDE) test strip USE AS DIRECTED FOUR TIMES DAILY 150 strip 2   Insulin  Lispro Prot & Lispro (HUMALOG  75/25 MIX) (75-25) 100 UNIT/ML Kwikpen Inject 90 Units into the skin 2 (two) times daily with a meal. 135 mL 2   isosorbide  mononitrate (IMDUR ) 30 MG 24 hr tablet TAKE 1/2 TABLET BY MOUTH DAILY (Need TO make APPOINTMENT FOR NEXT refills) (Patient taking differently: Take 30 mg by mouth daily. Take 1/2 tablet daily) 3 tablet 0   lisinopril  (ZESTRIL ) 20 MG tablet Take 1 tablet (20 mg total) by mouth daily. 90 tablet 3   meloxicam  (MOBIC ) 7.5 MG tablet Take 7.5 mg by mouth daily.     metFORMIN  (GLUCOPHAGE ) 1000 MG tablet Take 1 tablet (1,000 mg total) by mouth 2 (two) times daily with a meal. 60 tablet 3   methocarbamol  (ROBAXIN ) 500 MG tablet Take 500 mg by mouth 2 (two) times daily.     metoCLOPramide  (REGLAN ) 5 MG tablet Take 5 mg by mouth 3 (three) times daily as needed for nausea or vomiting.   1    metoprolol  succinate (TOPROL -XL) 100 MG 24 hr tablet Take 100 mg by mouth daily. Take with or immediately following a meal.     montelukast  (SINGULAIR ) 10 MG tablet Take 10 mg by mouth at bedtime.      Multiple Vitamin (MULTIVITAMIN) capsule Take 1 capsule by mouth daily.     naloxone (NARCAN) nasal spray 4 mg/0.1 mL SMARTSIG:Both Nares     nitroGLYCERIN  (NITROSTAT ) 0.4 MG SL tablet Place 1 tablet (0.4 mg total) under the tongue every 5 (five) minutes  as needed for chest pain (up to 3 doses. If taking 3rd dose call 911). 25 tablet 3   ondansetron  (ZOFRAN ) 4 MG tablet Take 4 mg by mouth every other day.     oxyCODONE -acetaminophen  (PERCOCET) 10-325 MG tablet Take 1 tablet by mouth every 6 (six) hours as needed for pain. 120 tablet 0   pantoprazole  (PROTONIX ) 40 MG tablet TAKE 1 TABLET BY MOUTH TWICE DAILY BEFORE MEALS (Patient taking differently: Take 40 mg by mouth 2 (two) times daily before a meal.) 60 tablet 1   polyethylene glycol (MIRALAX  / GLYCOLAX ) 17 g packet Take 17 g by mouth daily as needed for moderate constipation.     potassium chloride  (KLOR-CON ) 10 MEQ tablet Take 10 mEq by mouth 2 (two) times daily.     PROAIR  HFA 108 (90 Base) MCG/ACT inhaler Inhale 2 puffs into the lungs every 4 (four) hours as needed for wheezing or shortness of breath.   8   promethazine  (PHENERGAN ) 25 MG tablet Take 25 mg by mouth 4 (four) times daily as needed.     senna-docusate (SENOKOT-S) 8.6-50 MG tablet Take 2 tablets by mouth at bedtime. 60 tablet 2   SURE COMFORT INS SYR 1CC/30G 30G X 5/16 1 ML MISC 1 each by Other route daily.     topiramate  (TOPAMAX ) 50 MG tablet TAKE 1 TABLET BY MOUTH TWICE DAILY 60 tablet 3   ferrous sulfate  325 (65 FE) MG EC tablet Take by mouth.     gabapentin  (NEURONTIN ) 300 MG capsule Take 1 capsule (300 mg total) by mouth 3 (three) times daily. 90 capsule 0   lidocaine  (XYLOCAINE ) 5 % ointment Apply topically 2 (two) times daily. (Patient not taking: Reported on 03/26/2023)      SSD 1 % cream Apply topically 2 (two) times daily. (Patient not taking: Reported on 03/26/2023)     No facility-administered medications prior to visit.    Review of Systems  Review of Systems  Constitutional: Negative.   HENT: Negative.    Respiratory: Negative.    Cardiovascular:  Positive for leg swelling.  Psychiatric/Behavioral:  Positive for sleep disturbance.    Physical Exam  BP 112/60 (BP Location: Right Arm, Cuff Size: Large)   Pulse 75   Temp 97.8 F (36.6 C) (Temporal)   Ht 5' 4 (1.626 m)   Wt 247 lb 3.2 oz (112.1 kg)   SpO2 95%   BMI 42.43 kg/m  Physical Exam Constitutional:      Appearance: Normal appearance.  HENT:     Head: Normocephalic and atraumatic.     Mouth/Throat:     Mouth: Mucous membranes are moist.     Pharynx: Oropharynx is clear.  Cardiovascular:     Rate and Rhythm: Normal rate and regular rhythm.  Pulmonary:     Effort: Pulmonary effort is normal.     Breath sounds: Normal breath sounds.  Musculoskeletal:        General: Normal range of motion.  Skin:    General: Skin is warm and dry.  Neurological:     General: No focal deficit present.     Mental Status: She is alert and oriented to person, place, and time. Mental status is at baseline.  Psychiatric:        Mood and Affect: Mood normal.        Behavior: Behavior normal.        Thought Content: Thought content normal.        Judgment: Judgment normal.  Lab Results:  CBC    Component Value Date/Time   WBC 7.3 07/14/2022 0423   RBC 4.11 07/14/2022 0423   HGB 11.0 (L) 07/14/2022 0423   HCT 35.0 (L) 07/14/2022 0423   PLT 344 07/14/2022 0423   MCV 85.2 07/14/2022 0423   MCH 26.8 07/14/2022 0423   MCHC 31.4 07/14/2022 0423   RDW 14.9 07/14/2022 0423   LYMPHSABS 2.1 07/10/2022 1345   MONOABS 1.6 (H) 07/10/2022 1345   EOSABS 0.0 07/10/2022 1345   BASOSABS 0.1 07/10/2022 1345    BMET    Component Value Date/Time   NA 141 07/14/2022 0423   NA 140 01/01/2022 1357    K 3.1 (L) 07/14/2022 0423   CL 100 07/14/2022 0423   CO2 32 07/14/2022 0423   GLUCOSE 91 07/14/2022 0423   BUN 10 07/14/2022 0423   BUN 17 01/01/2022 1357   CREATININE 0.72 07/14/2022 0423   CREATININE 0.70 11/02/2018 1105   CALCIUM  8.5 (L) 07/14/2022 0423   GFRNONAA >60 07/14/2022 0423   GFRNONAA 91 11/02/2018 1105   GFRAA >60 10/27/2019 1130   GFRAA 105 11/02/2018 1105    BNP    Component Value Date/Time   BNP 29.0 07/10/2022 1350    ProBNP No results found for: PROBNP  Imaging: No results found.   Assessment & Plan:   1. Hx of sleep apnea (Primary) - Split night study; Future  2. Loud snoring - Split night study; Future  3. Nocturnal hypoxia - Split night study; Future      Obstructive Sleep Apnea (OSA) History of OSA with non-compliance to CPAP therapy for the past 2-3 years. Reports snoring and poor sleep quality. Open to restarting CPAP therapy. -Order in-lab split night sleep study to assess current severity of OSA and need for CPAP therapy. -Advise to continue using oxygen  at night until sleep study is completed. -Encourage side sleeping position or use of wedge pillow to decrease apnea events. -Follow-up in 6-8 weeks to discuss results of sleep study and potential initiation of CPAP therapy.  Insomnia Difficulty falling asleep, taking 30-60 minutes to fall asleep. Currently taking Doxepin  at bedtime which appears to be helping with sleep initiation. -Address further if sleep quality does not improve with management of OSA.  Weight Management Stable weight with recent attempts to lose weight. History of weight loss with Ozempic , but discontinued per cardiologist's advice. -Encourage continued efforts for weight loss as it can help improve OSA symptoms.   Almarie LELON Ferrari, NP 03/26/2023

## 2023-04-06 ENCOUNTER — Ambulatory Visit: Payer: 59 | Attending: Nurse Practitioner | Admitting: Nurse Practitioner

## 2023-04-06 ENCOUNTER — Encounter: Payer: Self-pay | Admitting: Nurse Practitioner

## 2023-04-06 VITALS — BP 124/76 | HR 86 | Ht 64.0 in | Wt 244.0 lb

## 2023-04-06 DIAGNOSIS — R600 Localized edema: Secondary | ICD-10-CM | POA: Diagnosis not present

## 2023-04-06 DIAGNOSIS — I2510 Atherosclerotic heart disease of native coronary artery without angina pectoris: Secondary | ICD-10-CM

## 2023-04-06 DIAGNOSIS — E1165 Type 2 diabetes mellitus with hyperglycemia: Secondary | ICD-10-CM

## 2023-04-06 DIAGNOSIS — I5032 Chronic diastolic (congestive) heart failure: Secondary | ICD-10-CM

## 2023-04-06 DIAGNOSIS — I10 Essential (primary) hypertension: Secondary | ICD-10-CM | POA: Diagnosis not present

## 2023-04-06 MED ORDER — FUROSEMIDE 40 MG PO TABS: 40.0000 mg | ORAL_TABLET | Freq: Every day | ORAL | 0 refills | Status: AC

## 2023-04-06 NOTE — Progress Notes (Unsigned)
Office Visit    Patient Name: [name] Date of Encounter: 08/20/2022  PCP:  Ignatius Specking, MD   [facility]  Cardiologist:  Dina Rich, MD  Advanced Practice Provider:  No care team member to display Electrophysiologist:  None   Chief Complaint    [name] is a 73 y.o. female with a hx of CAD, chronic diastolic CHF, A-fib, hypertension, type 2 diabetes, diabetic neuropathy, history of palpitations, history of chest pain, COPD, and OSA, who presents today for scheduled follow-up.  Past Medical History    Past Medical History:  Diagnosis Date   Anxiety and depression    Arthritis    CAD (coronary artery disease)    Stent placement circumflex coronary 2007, catheterization 2008 patent stents. 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    High cholesterol    Hx of blood clots    Hypertension    MI (myocardial infarction) (HCC)    2007   Obstructive sleep apnea    CPAP, setting ?2   Palpitations    Sleep apnea    Tachycardia    Past Surgical History:  Procedure Laterality Date   ABDOMINAL HYSTERECTOMY     Arm Surgery Bilateral    due to fall-pt broke both forearms   BIOPSY N/A 07/01/2013   Procedure: BIOPSY;  Surgeon: Malissa Hippo, MD;  Location: [facility];  Service: Endoscopy;  Laterality: N/A;   CHOLECYSTECTOMY     COLONOSCOPY  08/06/2011   Procedure: COLONOSCOPY;  Surgeon: Malissa Hippo, MD;  Location: [facility];  Service: Endoscopy;  Laterality: N/A;  215   COLONOSCOPY WITH PROPOFOL N/A 12/31/2012   Procedure: COLONOSCOPY WITH PROPOFOL;  Surgeon: Malissa Hippo,  MD;  Location: [facility];  Service: Endoscopy;  Laterality: N/A;  in cecum at 0814, total withdrawal time   COLONOSCOPY WITH PROPOFOL N/A 02/05/2018   Procedure: COLONOSCOPY WITH PROPOFOL;  Surgeon: Malissa Hippo, MD;  [facility]: [facility];  Service: Endoscopy;  Laterality: N/A;  1:25   CORONARY ANGIOPLASTY WITH STENT PLACEMENT     CYSTOSCOPY W/ URETERAL STENT PLACEMENT Right 11/10/2019   at [facility]   CYSTOSCOPY WITH RETROGRADE PYELOGRAM, URETEROSCOPY AND STENT PLACEMENT Right 12/22/2019   Procedure: CYSTOSCOPY WITH RIGHT URETERAL STENT REMOVAL; RIGHT RETROGRADE PYELOGRAM,RIGHT URETEROSCOPY;  Surgeon: Malen Gauze, MD;  Location: [facility];  Service: Urology;  Laterality: Right;   ESOPHAGEAL DILATION N/A 06/07/2015   Procedure: ESOPHAGEAL DILATION;  Surgeon: Malissa Hippo, MD;  [facility];  Service: Endoscopy;  Laterality: N/A;   ESOPHAGOGASTRODUODENOSCOPY N/A 06/07/2015   Procedure: ESOPHAGOGASTRODUODENOSCOPY (EGD);  Surgeon: Malissa Hippo, MD;  [facility];  Service: Endoscopy;  Laterality: N/A;  2:45 - moved to 1:00 - Ann to notify pt   ESOPHAGOGASTRODUODENOSCOPY (EGD) WITH PROPOFOL N/A 12/31/2012   Procedure: ESOPHAGOGASTRODUODENOSCOPY (EGD) WITH PROPOFOL;  Surgeon: Malissa Hippo, MD;  Location: [facility];  Service: Endoscopy;  Laterality: N/A;  GE junction 37,   ESOPHAGOGASTRODUODENOSCOPY (EGD) WITH  PROPOFOL N/A 07/01/2013   Procedure: ESOPHAGOGASTRODUODENOSCOPY (EGD) WITH PROPOFOL;  Surgeon: Malissa Hippo, MD;  Location: [facility];  Service: Endoscopy;  Laterality: N/A;  950   MALONEY DILATION N/A 12/31/2012   Procedure: MALONEY DILATION;  Surgeon: Malissa Hippo, MD;  Location: [facility];  Service: Endoscopy;  Laterality: N/A;  used a # 54,#56   MALONEY DILATION N/A 07/01/2013   Procedure: MALONEY DILATION;  Surgeon: Malissa Hippo, MD;  Location: [facility];  Service: Endoscopy;  Laterality: N/A;  54/58; no heme present   POLYPECTOMY  02/05/2018   Procedure:  POLYPECTOMY;  Surgeon: Malissa Hippo, MD;  [facility];  Service: Endoscopy;;  distal rectum (HS)   STONE EXTRACTION WITH BASKET Right 12/22/2019   Procedure: STONE EXTRACTION WITH BASKET;  Surgeon: Malen Gauze, MD;  Location: [facility];  Service: Urology;  Laterality: Right;    Allergies  Allergies  Allergen Reactions   Amphetamine-Dextroamphetamine Swelling   Nitrofuran Derivatives Itching and Swelling   Amphetamine-Dextroamphet Er Swelling   Pregabalin Swelling   Topiramate Other (See Comments)    Tongue tingle   Verelan [Verapamil] Rash    History of Present Illness    [name] is a 73 y.o. female with a PMH as mentioned above.  Previous cardiovascular history includes MI in 2007, at that time received PCI to left circumflex.    Admitted to [facility] in 2021 due to UTI with sepsis, had some A-fib in that setting during admission, was started on Eliquis at the time, however she stopped taking this.  In 2022 a 30-day event monitor was negative for any arrhythmias.  NST in 2022 was negative for ischemia.  Echocardiogram around that time revealed EF 55 to 60%, no wall motion abnormalities, grade 1 DD.  Last seen by Dr. Dina Rich on July 11, 2021.  She noted chronic chest pains, she stated that pain could improve with antacid but also with nitroglycerin.  Imdur 50 mg daily was trialed.  She appeared volume up, Lasix increased to 60 mg daily.  In the interim, she has had multiple ED and hospital admissions since last office visit.  More recently, admitted due to TIA in February 2024, MRI of the brain was negative, was started on DAPT with aspirin and Plavix for 21 days, then instructed to stop Plavix and continue only aspirin indefinitely.  Was also treated for UTI.  Admitted to [facility] due to sepsis in April 2024, also presented with chest pain.  CT of abdomen was significant for left pyelonephritis, troponins were negative.  Was treated with antibiotics,  will follow-up with urology outpatient.  Recent ED visit at [facility] for altered mental status. CT of head negative, showed multifocal chronic infarcts with atrophy and chronic small vessel ischemic changes.  Her symptoms improved after receiving pain medication.   Today she presents for follow-up.  She states she is doing well. BP has been elevated at home. Denies any chest pain, shortness of breath, palpitations, syncope, presyncope, dizziness, orthopnea, PND, swelling or significant weight changes, acute bleeding, or claudication. Wanting to know about weight loss medication.   EKGs/Labs/Other Studies Reviewed:   The following studies were reviewed today:   EKG:  EKG is not ordered today.    Echo 04/2022:  1. Left ventricular ejection fraction, by estimation, is 60 to 65%. The  left ventricle has normal function. The left ventricle has no regional  wall motion abnormalities. There is moderate concentric left ventricular  hypertrophy. Left  ventricular  diastolic parameters are indeterminate.   2. Right ventricular systolic function is normal. The right ventricular  size is normal. Tricuspid regurgitation signal is inadequate for assessing  PA pressure.   3. The mitral valve is grossly normal. Trivial mitral valve  regurgitation.   4. The aortic valve is tricuspid. There is mild calcification of the  aortic valve. Aortic valve regurgitation is not visualized.   5. The inferior vena cava is normal in size with greater than 50%  respiratory variability, suggesting right atrial pressure of 3 mmHg.   Comparison(s): Prior images reviewed side by side. LVEF remains normal  range at 60-65%.  Myoview 08/2020: No diagnostic ST segment changes to indicate ischemia. Very small, mild intensity, apical anteroseptal defect that is reversible and consistent with either variable breast attenuation or small ischemic territory. This is an intermediate risk study based on calculated ejection  fraction, otherwise low risk in terms of perfusion imaging. Suggest echocardiogram for corroboration. Nuclear stress EF: 45%. There was no ST segment deviation noted during stress.  Recent Labs: 07/10/2022: B Natriuretic Peptide 29.0 07/14/2022: ALT 13; BUN 10; Creatinine, Ser 0.72; Hemoglobin 11.0; Platelets 344; Potassium 3.1; Sodium 141  Recent Lipid Panel    Component Value Date/Time   CHOL 152 05/09/2022 0759   CHOL 128 01/01/2022 1357   TRIG 113 05/09/2022 0759   HDL 51 05/09/2022 0759   HDL 48 01/01/2022 1357   CHOLHDL 3.0 05/09/2022 0759   VLDL 23 05/09/2022 0759   LDLCALC 78 05/09/2022 0759   LDLCALC 46 01/01/2022 1357   LDLCALC 90 07/06/2018 0936    Risk Assessment/Calculations:   CHA2DS2-VASc Score = 5  This indicates a 7.2% annual risk of stroke. The patient's score is based upon: CHF History: 1 HTN History: 1 Diabetes History: 1 Stroke History: 0 Vascular Disease History: 0 Age Score: 1 Gender Score: 1   Home Medications   Current Meds  Medication Sig   acetaminophen (TYLENOL) 325 MG tablet Take 2 tablets (650 mg total) by mouth every 4 (four) hours as needed for mild pain (or temp > 37.5 C (99.5 F)).   ALPRAZolam (XANAX) 0.5 MG tablet Take 1 tablet (0.5 mg total) by mouth 2 (two) times daily as needed for anxiety or sleep.   aspirin EC 81 MG tablet Take 1 tablet (81 mg total) by mouth daily with breakfast. -Take Aspirin 81 mg daily along with Plavix 75 mg daily for 21 days then after that STOP the Plavix  and continue ONLY Aspirin 81 mg daily indefinitely--   atorvastatin (LIPITOR) 40 MG tablet Take 40 mg by mouth daily.   B-D ULTRAFINE III SHORT PEN 31G X 8 MM MISC Inject into the skin 4 (four) times daily.   Blood Glucose Monitoring Suppl (ACCU-CHEK GUIDE ME) w/Device KIT 1 Piece by Does not apply route as directed.   buPROPion ER (WELLBUTRIN SR) 100 MG 12 hr tablet Take 1 tablet (100 mg total) by mouth daily.   Cholecalciferol (VITAMIN D3) 125 MCG (5000  UT) CAPS TAKE ONE CAPSULE BY MOUTH DAILY (Patient taking differently: Take 5,000 Units by mouth daily.)   diclofenac Sodium (VOLTAREN) 1 % GEL Apply topically.   doxepin (SINEQUAN) 25 MG capsule Take 75 mg by mouth at bedtime.    FEROSUL 325 (65 Fe) MG tablet Take 325 mg by mouth every morning.   furosemide (LASIX) 40 MG tablet TAKE 1 AND 1/2 TABLETS BY MOUTH DAILY   gabapentin (NEURONTIN) 300 MG capsule Take 1 capsule (300  mg total) by mouth 3 (three) times daily.   glipiZIDE (GLUCOTROL XL) 5 MG 24 hr tablet Take 5 mg by mouth daily.   Insulin Lispro Prot & Lispro (HUMALOG 75/25 MIX) (75-25) 100 UNIT/ML Kwikpen Inject 90 Units into the skin 2 (two) times daily with a meal.   isosorbide mononitrate (IMDUR) 30 MG 24 hr tablet TAKE 1/2 TABLET BY MOUTH DAILY (Need TO make APPOINTMENT FOR NEXT refills) (Patient taking differently: Take 30 mg by mouth daily. Take 1/2 tablet daily)   lidocaine (XYLOCAINE) 5 % ointment Apply topically 2 (two) times daily.   lisinopril (ZESTRIL) 20 MG tablet Take 1 tablet (20 mg total) by mouth daily.   meloxicam (MOBIC) 7.5 MG tablet Take 7.5 mg by mouth daily.   metFORMIN (GLUCOPHAGE) 1000 MG tablet Take 1 tablet (1,000 mg total) by mouth 2 (two) times daily with a meal.   methocarbamol (ROBAXIN) 500 MG tablet Take 500 mg by mouth 2 (two) times daily.   metoCLOPramide (REGLAN) 5 MG tablet Take 5 mg by mouth 3 (three) times daily as needed for nausea or vomiting.    metoprolol succinate (TOPROL-XL) 100 MG 24 hr tablet Take 100 mg by mouth daily. Take with or immediately following a meal.   montelukast (SINGULAIR) 10 MG tablet Take 10 mg by mouth at bedtime.    Multiple Vitamin (MULTIVITAMIN) capsule Take 1 capsule by mouth daily.   naloxone (NARCAN) nasal spray 4 mg/0.1 mL SMARTSIG:Both Nares   nitroGLYCERIN (NITROSTAT) 0.4 MG SL tablet Place 1 tablet (0.4 mg total) under the tongue every 5 (five) minutes as needed for chest pain (up to 3 doses. If taking 3rd dose call  911).   ondansetron (ZOFRAN) 4 MG tablet Take 4 mg by mouth every other day.   oxyCODONE-acetaminophen (PERCOCET) 10-325 MG tablet Take 1 tablet by mouth every 6 (six) hours as needed for pain.   pantoprazole (PROTONIX) 40 MG tablet TAKE 1 TABLET BY MOUTH TWICE DAILY BEFORE MEALS (Patient taking differently: Take 40 mg by mouth 2 (two) times daily before a meal.)   polyethylene glycol (MIRALAX / GLYCOLAX) 17 g packet Take 17 g by mouth daily as needed for moderate constipation.   potassium chloride (KLOR-CON) 10 MEQ tablet Take 10 mEq by mouth 2 (two) times daily.   PROAIR HFA 108 (90 Base) MCG/ACT inhaler Inhale 2 puffs into the lungs every 4 (four) hours as needed for wheezing or shortness of breath.    promethazine (PHENERGAN) 25 MG tablet Take 25 mg by mouth 4 (four) times daily as needed.   Semaglutide,0.25 or 0.5MG /DOS, 2 MG/3ML SOPN Inject 0.25 mg into the skin once a week. For 4 weeks, then increase to 0.5 mg weekly   senna-docusate (SENOKOT-S) 8.6-50 MG tablet Take 2 tablets by mouth at bedtime.   SURE COMFORT INS SYR 1CC/30G 30G X 5/16" 1 ML MISC 1 each by Other route daily.   lisinopril (ZESTRIL) 10 MG tablet Take 10 mg by mouth daily.     Review of Systems   All other systems reviewed and are otherwise negative except as noted above.  Physical Exam    VS:  BP 124/76   Pulse 86   Ht 5\' 4"  (1.626 m)   Wt 244 lb (110.7 kg)   SpO2 97%   BMI 41.88 kg/m  , BMI Body mass index is 41.88 kg/m.  Wt Readings from Last 3 Encounters:  04/06/23 244 lb (110.7 kg)  03/26/23 247 lb 3.2 oz (112.1 kg)  03/25/23  242 lb 6.4 oz (110 kg)    Repeat BP: 162/78  GEN: Morbidly obese, 73 y.o. female in no acute distress. HEENT: normal. Neck: Supple, no JVD, carotid bruits, or masses. Cardiac: S1/S2, RRR, no murmurs, rubs, or gallops. No clubbing, cyanosis, edema.  Radials/PT 2+ and equal bilaterally.  Respiratory:  Respirations regular and unlabored, clear to auscultation bilaterally. GI:  Soft, nontender, nondistended. MS: No deformity or atrophy. Skin: Warm and dry, no rash. Neuro:  Strength and sensation are intact. Psych: Normal affect.  Assessment & Plan    CAD Stable with no anginal symptoms. No indication for ischemic evaluation. Continue Aspirin, atorvastatin, Imdur, Toprol XL, NTG PRN, and increasing lisinopril to 20 mg daily. Will obtain BMET in 1-2 weeks per protocol - see below. Heart healthy diet and regular cardiovascular exercise encouraged.   Chronic diastolic CHF Stage C, NYHA class I-II symptoms. Euvolemic and well compensated on exam. Continue Imdur, Toprol XL, furosemide and potassium supplement, and increasing lisinopril to 20 mg daily. Low sodium diet, fluid restriction <2L, and daily weights encouraged. Educated to contact our office for weight gain of 2 lbs overnight or 5 lbs in one week. Will obtain BMET per protocol.   HTN, medication management BP has been consistently elevated and not at goal. Discussed SBP goal < 130. Discussed to monitor BP at home at least 2 hours after medications and sitting for 5-10 minutes. Will increase lisinopril to 20 mg daily, will obtain BMET in 1-2 weeks. Continue imdur and Toprol XL. Given BP log. Heart healthy diet and regular cardiovascular exercise encouraged.   T2DM Last A1C 8.3. Blood sugars not at goal. Will initiate Ozempic, see blow. Continue rest of medication regimen. Diabetic, heart healthy diet and regular cardiovascular exercise encouraged. Continue to follow with PCP.  Morbid obesity BMI 42.95. Requesting medication to lose weight. Discussed Ozempic, and she is agreeable to proceed. Patient denied any personal or family history of medullary thyroid carcinoma or personal or family history of Multiple Endocrine Neoplasia syndrome type 2. Educated patient about medication. Will begin Ozempic. Will benefit from cardiac perspective as this will also help improve CBG and A1C level. Weight loss via diet and  exercise encouraged. Discussed the impact being overweight would have on cardiovascular risk.   Disposition: Follow up in 6 month(s) with Dina Rich, MD or [facility].  Signed, Sharlene Dory, NP 04/06/2023, 1:01 PM [facility]

## 2023-04-06 NOTE — Patient Instructions (Addendum)
Medication Instructions:  Your physician has recommended you make the following change in your medication:  Please Increase Lasix to an extra 40 Mg tablet in the evening for 3 days then return to normal dosing   Labwork: In 1 week   Testing/Procedures: None   Follow-Up: Your physician recommends that you schedule a follow-up appointment in: 6 months   Any Other Special Instructions Will Be Listed Below (If Applicable).  If you need a refill on your cardiac medications before your next appointment, please call your pharmacy.

## 2023-04-13 DIAGNOSIS — I25119 Atherosclerotic heart disease of native coronary artery with unspecified angina pectoris: Secondary | ICD-10-CM | POA: Diagnosis not present

## 2023-04-13 DIAGNOSIS — I5032 Chronic diastolic (congestive) heart failure: Secondary | ICD-10-CM | POA: Diagnosis not present

## 2023-04-13 DIAGNOSIS — I2510 Atherosclerotic heart disease of native coronary artery without angina pectoris: Secondary | ICD-10-CM | POA: Diagnosis not present

## 2023-04-16 DIAGNOSIS — M797 Fibromyalgia: Secondary | ICD-10-CM | POA: Diagnosis not present

## 2023-04-16 DIAGNOSIS — M159 Polyosteoarthritis, unspecified: Secondary | ICD-10-CM | POA: Diagnosis not present

## 2023-04-16 DIAGNOSIS — G4733 Obstructive sleep apnea (adult) (pediatric): Secondary | ICD-10-CM | POA: Diagnosis not present

## 2023-04-16 DIAGNOSIS — I509 Heart failure, unspecified: Secondary | ICD-10-CM | POA: Diagnosis not present

## 2023-04-16 DIAGNOSIS — R0902 Hypoxemia: Secondary | ICD-10-CM | POA: Diagnosis not present

## 2023-04-17 DIAGNOSIS — E1165 Type 2 diabetes mellitus with hyperglycemia: Secondary | ICD-10-CM | POA: Diagnosis not present

## 2023-04-17 DIAGNOSIS — R0789 Other chest pain: Secondary | ICD-10-CM | POA: Diagnosis not present

## 2023-04-17 DIAGNOSIS — I10 Essential (primary) hypertension: Secondary | ICD-10-CM | POA: Diagnosis not present

## 2023-04-17 DIAGNOSIS — Z299 Encounter for prophylactic measures, unspecified: Secondary | ICD-10-CM | POA: Diagnosis not present

## 2023-04-23 ENCOUNTER — Encounter: Payer: 59 | Admitting: Registered Nurse

## 2023-04-23 NOTE — Progress Notes (Deleted)
 Subjective:    Patient ID: [name], female    DOB: [birth year], 73 y.o.   MRN: [id number]  HPI   Pain Inventory Average Pain {NUMBERS; 0-10:5044} Pain Right Now {NUMBERS; 0-10:5044} My pain is {PAIN DESCRIPTION:21022940}  In the last 24 hours, has pain interfered with the following? General activity {NUMBERS; 0-10:5044} Relation with others {NUMBERS; 0-10:5044} Enjoyment of life {NUMBERS; 0-10:5044} What TIME of day is your pain at its worst? {time of day:24191} Sleep (in general) {BHH GOOD/FAIR/POOR:22877}  Pain is worse with: {ACTIVITIES:21022942} Pain improves with: {PAIN IMPROVES TPUY:78977056} Relief from Meds: {NUMBERS; 0-10:5044}  Family History  Problem Relation Age of Onset   Heart attack Mother    Colon cancer Mother    Heart attack Father    Stroke Sister    Hemophilia Child    Cancer Other    Coronary artery disease Other    Cancer Brother    Cancer Maternal Aunt    Cancer Maternal Uncle    Cancer Paternal Aunt    Cancer Paternal Uncle    Cancer Brother    Social History   Socioeconomic History   Marital status: Divorced    Spouse name: Not on file   Number of children: 4   Years of education: Not on file   Highest education level: Not on file  Occupational History   Occupation: Disabled  Tobacco Use   Smoking status: Never   Smokeless tobacco: Never  Vaping Use   Vaping status: Never Used  Substance and Sexual Activity   Alcohol  use: No    Alcohol /week: 0.0 standard drinks of alcohol    Drug use: No   Sexual activity: Not on file  Other Topics Concern   Not on file  Social History Narrative   Patient does not get regular exercise   Denies caffeine use    Social Drivers of Corporate Investment Banker Strain: Low Risk  (07/21/2022)   Overall Financial Resource Strain (CARDIA)    Difficulty of Paying Living Expenses: Not very hard  Food Insecurity: No Food Insecurity (07/21/2022)   Hunger Vital Sign    Worried About Running Out of  Food in the Last Year: Never true    Ran Out of Food in the Last Year: Never true  Transportation Needs: No Transportation Needs (07/21/2022)   PRAPARE - Administrator, Civil Service (Medical): No    Lack of Transportation (Non-Medical): No  Physical Activity: Inactive (07/21/2022)   Exercise Vital Sign    Days of Exercise per Week: 0 days    Minutes of Exercise per Session: 0 min  Stress: Stress Concern Present (10/18/2018)   Received from [facility], [facility] of Occupational Health - Occupational Stress Questionnaire    Feeling of Stress : To some extent  Social Connections: Moderately Integrated (10/18/2018)   Received from [facility], [facility]   Social Connection and Isolation Panel [NHANES]    Frequency of Communication with Friends and Family: More than three times a week    Frequency of Social Gatherings with Friends and Family: More than three times a week    Attends Religious Services: More than 4 times per year    Active Member of [employer] or Organizations: No    Attends Banker Meetings: Never    Marital Status: Living with partner   Past Surgical History:  Procedure Laterality Date   ABDOMINAL HYSTERECTOMY     Arm Surgery  Bilateral    due to fall-pt broke both forearms   BIOPSY N/A 07/01/2013   Procedure: BIOPSY;  Surgeon: Claudis RAYMOND Rivet, MD;  [facility];  Service: Endoscopy;  Laterality: N/A;   CHOLECYSTECTOMY     COLONOSCOPY  08/06/2011   Procedure: COLONOSCOPY;  Surgeon: Claudis RAYMOND Rivet, MD;  [facility];  Service: Endoscopy;  Laterality: N/A;  215   COLONOSCOPY WITH PROPOFOL  N/A 12/31/2012   Procedure: COLONOSCOPY WITH PROPOFOL ;  Surgeon: Claudis RAYMOND Rivet, MD;  [facility]: [facility];  Service: Endoscopy;  Laterality: N/A;  in cecum at 0814, total withdrawal time   COLONOSCOPY WITH PROPOFOL  N/A 02/05/2018   Procedure: COLONOSCOPY WITH PROPOFOL ;  Surgeon: Rivet Claudis RAYMOND, MD;  [facility];  Service: Endoscopy;  Laterality: N/A;  1:25   CORONARY ANGIOPLASTY WITH STENT PLACEMENT     CYSTOSCOPY W/ URETERAL STENT PLACEMENT Right 11/10/2019   at [facility]   CYSTOSCOPY WITH RETROGRADE PYELOGRAM, URETEROSCOPY AND STENT PLACEMENT Right 12/22/2019   Procedure: CYSTOSCOPY WITH RIGHT URETERAL STENT REMOVAL; RIGHT RETROGRADE PYELOGRAM,RIGHT URETEROSCOPY;  Surgeon: Sherrilee Belvie CROME, MD;  [facility]: [facility];  Service: Urology;  Laterality: Right;   ESOPHAGEAL DILATION N/A 06/07/2015   Procedure: ESOPHAGEAL DILATION;  Surgeon: Claudis RAYMOND Rivet, MD;  [facility];  Service: Endoscopy;  Laterality: N/A;   ESOPHAGOGASTRODUODENOSCOPY N/A 06/07/2015   Procedure: ESOPHAGOGASTRODUODENOSCOPY (EGD);  Surgeon: Claudis RAYMOND Rivet, MD;  [facility];  Service: Endoscopy;  Laterality: N/A;  2:45 - moved to 1:00 - Ann to notify pt   ESOPHAGOGASTRODUODENOSCOPY (EGD) WITH PROPOFOL  N/A 12/31/2012   Procedure: ESOPHAGOGASTRODUODENOSCOPY (EGD) WITH PROPOFOL ;  Surgeon: Claudis RAYMOND Rivet, MD;  [facility];  Service: Endoscopy;  Laterality: N/A;  GE junction 37,   ESOPHAGOGASTRODUODENOSCOPY (EGD) WITH PROPOFOL  N/A 07/01/2013   Procedure: ESOPHAGOGASTRODUODENOSCOPY (EGD) WITH PROPOFOL ;  Surgeon: Claudis RAYMOND Rivet, MD;  [facility]: [facility];  Service: Endoscopy;  Laterality: N/A;  950   MALONEY DILATION N/A 12/31/2012   Procedure: MALONEY DILATION;  Surgeon: Claudis RAYMOND Rivet, MD;  [facility]: [facility];  Service: Endoscopy;  Laterality: N/A;  used a # 54,#56   MALONEY DILATION N/A 07/01/2013   Procedure: MALONEY DILATION;  Surgeon: Claudis RAYMOND Rivet, MD;  [facility]: [facility];  Service: Endoscopy;  Laterality: N/A;  54/58; no heme present   POLYPECTOMY  02/05/2018   Procedure: POLYPECTOMY;  Surgeon: Rivet Claudis RAYMOND, MD;  [facility];  Service: Endoscopy;;  distal rectum (HS)   STONE EXTRACTION WITH BASKET Right 12/22/2019   Procedure: STONE EXTRACTION WITH BASKET;  Surgeon: Sherrilee Belvie CROME, MD;   [facility];  Service: Urology;  Laterality: Right;   Past Surgical History:  Procedure Laterality Date   ABDOMINAL HYSTERECTOMY     Arm Surgery Bilateral    due to fall-pt broke both forearms   BIOPSY N/A 07/01/2013   Procedure: BIOPSY;  Surgeon: Claudis RAYMOND Rivet, MD;  [facility];  Service: Endoscopy;  Laterality: N/A;   CHOLECYSTECTOMY     COLONOSCOPY  08/06/2011   Procedure: COLONOSCOPY;  Surgeon: Claudis RAYMOND Rivet, MD;  [facility];  Service: Endoscopy;  Laterality: N/A;  215   COLONOSCOPY WITH PROPOFOL  N/A 12/31/2012   Procedure: COLONOSCOPY WITH PROPOFOL ;  Surgeon: Claudis RAYMOND Rivet, MD;  [facility]: [facility];  Service: Endoscopy;  Laterality: N/A;  in cecum at 0814, total withdrawal time   COLONOSCOPY WITH PROPOFOL  N/A 02/05/2018   Procedure: COLONOSCOPY WITH PROPOFOL ;  Surgeon: Rivet,  Claudis PENNER, MD;  [facility];  Service: Endoscopy;  Laterality: N/A;  1:25   CORONARY ANGIOPLASTY WITH STENT PLACEMENT     CYSTOSCOPY W/ URETERAL STENT PLACEMENT Right 11/10/2019   at [facility]   CYSTOSCOPY WITH RETROGRADE PYELOGRAM, URETEROSCOPY AND STENT PLACEMENT Right 12/22/2019   Procedure: CYSTOSCOPY WITH RIGHT URETERAL STENT REMOVAL; RIGHT RETROGRADE PYELOGRAM,RIGHT URETEROSCOPY;  Surgeon: Sherrilee Belvie CROME, MD;  [facility]: [facility];  Service: Urology;  Laterality: Right;   ESOPHAGEAL DILATION N/A 06/07/2015   Procedure: ESOPHAGEAL DILATION;  Surgeon: Claudis PENNER Rivet, MD;  [facility];  Service: Endoscopy;  Laterality: N/A;   ESOPHAGOGASTRODUODENOSCOPY N/A 06/07/2015   Procedure: ESOPHAGOGASTRODUODENOSCOPY (EGD);  Surgeon: Claudis PENNER Rivet, MD;  [facility];  Service: Endoscopy;  Laterality: N/A;  2:45 - moved to 1:00 - Ann to notify pt   ESOPHAGOGASTRODUODENOSCOPY (EGD) WITH PROPOFOL  N/A 12/31/2012   Procedure: ESOPHAGOGASTRODUODENOSCOPY (EGD) WITH PROPOFOL ;  Surgeon: Claudis PENNER Rivet, MD;  [facility];  Service: Endoscopy;  Laterality: N/A;  GE  junction 37,   ESOPHAGOGASTRODUODENOSCOPY (EGD) WITH PROPOFOL  N/A 07/01/2013   Procedure: ESOPHAGOGASTRODUODENOSCOPY (EGD) WITH PROPOFOL ;  Surgeon: Claudis PENNER Rivet, MD;  [facility]: [facility];  Service: Endoscopy;  Laterality: N/A;  950   MALONEY DILATION N/A 12/31/2012   Procedure: MALONEY DILATION;  Surgeon: Claudis PENNER Rivet, MD;  [facility];  Service: Endoscopy;  Laterality: N/A;  used a # 54,#56   MALONEY DILATION N/A 07/01/2013   Procedure: MALONEY DILATION;  Surgeon: Claudis PENNER Rivet, MD;  [facility]: [facility];  Service: Endoscopy;  Laterality: N/A;  54/58; no heme present   POLYPECTOMY  02/05/2018   Procedure: POLYPECTOMY;  Surgeon: Rivet Claudis PENNER, MD;  [facility];  Service: Endoscopy;;  distal rectum (HS)   STONE EXTRACTION WITH BASKET Right 12/22/2019   Procedure: STONE EXTRACTION WITH BASKET;  Surgeon: Sherrilee Belvie CROME, MD;  [facility];  Service: Urology;  Laterality: Right;   Past Medical History:  Diagnosis Date   Anxiety and depression    Arthritis    CAD (coronary artery disease)    Stent placement circumflex coronary 2007, catheterization 2008 patent stents. 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  Opioid Risk Score:   Fall Risk Score:  `1  Depression screen PHQ 2/9     03/25/2023    2:24 PM 02/26/2023    1:01 PM 01/01/2023    1:51 PM 12/04/2022     2:27 PM 11/04/2022   10:59 AM 10/06/2022    9:48 AM 08/26/2022    1:53 PM  Depression screen PHQ 2/9  Decreased Interest 1 1 0 1 0 1 1  Down, Depressed, Hopeless  1 1 0 1 0 1 1  PHQ - 2 Score 2 2 0 2 0 2 2    Review of Systems     Objective:   Physical Exam        Assessment & Plan:

## 2023-04-24 ENCOUNTER — Encounter: Payer: 59 | Admitting: Registered Nurse

## 2023-04-24 ENCOUNTER — Telehealth: Payer: Self-pay | Admitting: Registered Nurse

## 2023-04-24 MED ORDER — OXYCODONE-ACETAMINOPHEN 10-325 MG PO TABS: 1.0000 | ORAL_TABLET | Freq: Four times a day (QID) | ORAL | 0 refills | Status: DC | PRN

## 2023-04-24 NOTE — Telephone Encounter (Signed)
 Patient is sick and unable to come to appt today. Her next appt is in 2 weeks. Please send refills for her.

## 2023-04-24 NOTE — Telephone Encounter (Signed)
 PMP was Reviewed.  [name] called office reporting she is ill, her appointment was re-scheduled and Oxycodone  e-scribed to pharmacy.  Call placed to Ms. Reindl regarding the above, she verbalizes understanding.

## 2023-05-07 NOTE — Progress Notes (Unsigned)
 Subjective:    Patient ID: [name], female    DOB: 1951/02/19, 73 y.o.   MRN: [id number]  HPI: [name] is a 73 y.o. female whose appointment was changed to My-Chart visit due to lack of transportation.  I connected with Aris Lot by a video enabled telemedicine application and verified that I am speaking with the correct person using two identifiers.  Location: Patient: In her Home Provider: In the Office    I discussed the limitations of evaluation and management by telemedicine and the availability of in person appointments. The patient expressed understanding and agreed to proceed.  Ms. Cadogan for follow up appointment for chronic pain and medication refill. She states her pain is located in her neck, radiating into her bilateral shoulders, upper- lower back radiating into her bilateral hips and bilateral lower extremities. She also reports bilateral knee pain and bilateral feet with tingling and burning. She rates her pain 7.Her  current exercise regime is walking and performing stretching exercises.  Ms. Procter Morphine equivalent is 60.00 MME. She  is also prescribed Alprazolam by Dr. Sherril Croon .We have discussed the black box warning of using opioids and benzodiazepines. I highlighted the dangers of using these drugs together and discussed the adverse events including respiratory suppression, overdose, cognitive impairment and importance of compliance with current regimen. We will continue to monitor and adjust as indicated.    Last Oral Swab was Performed on 02/26/2024, it was consistent.     Pain Inventory Average Pain 7 Pain Right Now 7 My pain is constant, sharp, burning, dull, stabbing, tingling, and aching  In the last 24 hours, has pain interfered with the following? General activity 7 Relation with others 5 Enjoyment of life 7 What TIME of day is your pain at its worst? morning , daytime, evening, and night Sleep (in general) Good  Pain is worse with:  walking, bending, sitting, standing, and some activites Pain improves with: rest and medication Relief from Meds: 4  Family History  Problem Relation Age of Onset  . Heart attack Mother   . Colon cancer Mother   . Heart attack Father   . Stroke Sister   . Hemophilia Child   . Cancer Other   . Coronary artery disease Other   . Cancer Brother   . Cancer Maternal Aunt   . Cancer Maternal Uncle   . Cancer Paternal Aunt   . Cancer Paternal Uncle   . Cancer Brother    Social History   Socioeconomic History  . Marital status: Divorced    Spouse name: Not on file  . Number of children: 4  . Years of education: Not on file  . Highest education level: Not on file  Occupational History  . Occupation: Disabled  Tobacco Use  . Smoking status: Never  . Smokeless tobacco: Never  Vaping Use  . Vaping status: Never Used  Substance and Sexual Activity  . Alcohol use: No    Alcohol/week: 0.0 standard drinks of alcohol  . Drug use: No  . Sexual activity: Not on file  Other Topics Concern  . Not on file  Social History Narrative   Patient does not get regular exercise   Denies caffeine use    Social Drivers of Health   Financial Resource Strain: Low Risk  (07/21/2022)   Overall Financial Resource Strain (CARDIA)   . Difficulty of Paying Living Expenses: Not very hard  Food Insecurity: No Food Insecurity (07/21/2022)   Hunger Vital Sign   .  Worried About Programme researcher, broadcasting/film/video in the Last Year: Never true   . Ran Out of Food in the Last Year: Never true  Transportation Needs: No Transportation Needs (07/21/2022)   PRAPARE - Transportation   . Lack of Transportation (Medical): No   . Lack of Transportation (Non-Medical): No  Physical Activity: Inactive (07/21/2022)   Exercise Vital Sign   . Days of Exercise per Week: 0 days   . Minutes of Exercise per Session: 0 min  Stress: Stress Concern Present (10/18/2018)   Received from [facility], [facility]   [facility] of  Occupational Health - Occupational Stress Questionnaire   . Feeling of Stress : To some extent  Social Connections: Moderately Integrated (10/18/2018)   Received from [facility], [facility]   Social Connection and Isolation Panel [NHANES]   . Frequency of Communication with Friends and Family: More than three times a week   . Frequency of Social Gatherings with Friends and Family: More than three times a week   . Attends Religious Services: More than 4 times per year   . Active Member of Clubs or Organizations: No   . Attends Banker Meetings: Never   . Marital Status: Living with partner   Past Surgical History:  Procedure Laterality Date  . ABDOMINAL HYSTERECTOMY    . Arm Surgery Bilateral    due to fall-pt broke both forearms  . BIOPSY N/A 07/01/2013   Procedure: BIOPSY;  Surgeon: Malissa Hippo, MD;  [facility]: [facility];  Service: Endoscopy;  Laterality: N/A;  . CHOLECYSTECTOMY    . COLONOSCOPY  08/06/2011   Procedure: COLONOSCOPY;  Surgeon: Malissa Hippo, MD;  [facility];  Service: Endoscopy;  Laterality: N/A;  215  . COLONOSCOPY WITH PROPOFOL N/A 12/31/2012   Procedure: COLONOSCOPY WITH PROPOFOL;  Surgeon: Malissa Hippo, MD;  Location: [facility];  Service: Endoscopy;  Laterality: N/A;  in cecum at 0814, total withdrawal time  . COLONOSCOPY WITH PROPOFOL N/A 02/05/2018   Procedure: COLONOSCOPY WITH PROPOFOL;  Surgeon: Malissa Hippo, MD;  [facility];  Service: Endoscopy;  Laterality: N/A;  1:25  . CORONARY ANGIOPLASTY WITH STENT PLACEMENT    . CYSTOSCOPY W/ URETERAL STENT PLACEMENT Right 11/10/2019   at [facility]  . CYSTOSCOPY WITH RETROGRADE PYELOGRAM, URETEROSCOPY AND STENT PLACEMENT Right 12/22/2019   Procedure: CYSTOSCOPY WITH RIGHT URETERAL STENT REMOVAL; RIGHT RETROGRADE PYELOGRAM,RIGHT URETEROSCOPY;  Surgeon: Malen Gauze, MD;  [facility]: [facility];  Service: Urology;  Laterality: Right;  . ESOPHAGEAL DILATION N/A 06/07/2015    Procedure: ESOPHAGEAL DILATION;  Surgeon: Malissa Hippo, MD;  [facility];  Service: Endoscopy;  Laterality: N/A;  . ESOPHAGOGASTRODUODENOSCOPY N/A 06/07/2015   Procedure: ESOPHAGOGASTRODUODENOSCOPY (EGD);  Surgeon: Malissa Hippo, MD;  [facility];  Service: Endoscopy;  Laterality: N/A;  2:45 - moved to 1:00 - Ann to notify pt  . ESOPHAGOGASTRODUODENOSCOPY (EGD) WITH PROPOFOL N/A 12/31/2012   Procedure: ESOPHAGOGASTRODUODENOSCOPY (EGD) WITH PROPOFOL;  Surgeon: Malissa Hippo, MD;  [facility]: [facility];  Service: Endoscopy;  Laterality: N/A;  GE junction 37,  . ESOPHAGOGASTRODUODENOSCOPY (EGD) WITH PROPOFOL N/A 07/01/2013   Procedure: ESOPHAGOGASTRODUODENOSCOPY (EGD) WITH PROPOFOL;  Surgeon: Malissa Hippo, MD;  [facility]: [facility];  Service: Endoscopy;  Laterality: N/A;  950  . MALONEY DILATION N/A 12/31/2012   Procedure: MALONEY DILATION;  Surgeon: Malissa Hippo, MD;  [facility]: [facility];  Service: Endoscopy;  Laterality: N/A;  used a # 54,#56  . MALONEY DILATION N/A 07/01/2013   Procedure: Elease Hashimoto DILATION;  Surgeon: Malissa Hippo, MD;  [facility]: [facility];  Service: Endoscopy;  Laterality: N/A;  54/58; no heme present  . POLYPECTOMY  02/05/2018   Procedure: POLYPECTOMY;  Surgeon: Malissa Hippo, MD;  [facility];  Service: Endoscopy;;  distal rectum (HS)  . STONE EXTRACTION WITH BASKET Right 12/22/2019   Procedure: STONE EXTRACTION WITH BASKET;  Surgeon: Malen Gauze, MD;  [facility]: [facility];  Service: Urology;  Laterality: Right;   Past Surgical History:  Procedure Laterality Date  . ABDOMINAL HYSTERECTOMY    . Arm Surgery Bilateral    due to fall-pt broke both forearms  . BIOPSY N/A 07/01/2013   Procedure: BIOPSY;  Surgeon: Malissa Hippo, MD;  [facility]: [facility];  Service: Endoscopy;  Laterality: N/A;  . CHOLECYSTECTOMY    . COLONOSCOPY  08/06/2011   Procedure: COLONOSCOPY;  Surgeon: Malissa Hippo, MD;  [facility];  Service: Endoscopy;   Laterality: N/A;  215  . COLONOSCOPY WITH PROPOFOL N/A 12/31/2012   Procedure: COLONOSCOPY WITH PROPOFOL;  Surgeon: Malissa Hippo, MD;  [facility]: [facility];  Service: Endoscopy;  Laterality: N/A;  in cecum at 0814, total withdrawal time  . COLONOSCOPY WITH PROPOFOL N/A 02/05/2018   Procedure: COLONOSCOPY WITH PROPOFOL;  Surgeon: Malissa Hippo, MD;  [facility];  Service: Endoscopy;  Laterality: N/A;  1:25  . CORONARY ANGIOPLASTY WITH STENT PLACEMENT    . CYSTOSCOPY W/ URETERAL STENT PLACEMENT Right 11/10/2019   at [facility]  . CYSTOSCOPY WITH RETROGRADE PYELOGRAM, URETEROSCOPY AND STENT PLACEMENT Right 12/22/2019   Procedure: CYSTOSCOPY WITH RIGHT URETERAL STENT REMOVAL; RIGHT RETROGRADE PYELOGRAM,RIGHT URETEROSCOPY;  Surgeon: Malen Gauze, MD;  [facility]: [facility];  Service: Urology;  Laterality: Right;  . ESOPHAGEAL DILATION N/A 06/07/2015   Procedure: ESOPHAGEAL DILATION;  Surgeon: Malissa Hippo, MD;  [facility];  Service: Endoscopy;  Laterality: N/A;  . ESOPHAGOGASTRODUODENOSCOPY N/A 06/07/2015   Procedure: ESOPHAGOGASTRODUODENOSCOPY (EGD);  Surgeon: Malissa Hippo, MD;  [facility];  Service: Endoscopy;  Laterality: N/A;  2:45 - moved to 1:00 - Ann to notify pt  . ESOPHAGOGASTRODUODENOSCOPY (EGD) WITH PROPOFOL N/A 12/31/2012   Procedure: ESOPHAGOGASTRODUODENOSCOPY (EGD) WITH PROPOFOL;  Surgeon: Malissa Hippo, MD;  [facility];  Service: Endoscopy;  Laterality: N/A;  GE junction 37,  . ESOPHAGOGASTRODUODENOSCOPY (EGD) WITH PROPOFOL N/A 07/01/2013   Procedure: ESOPHAGOGASTRODUODENOSCOPY (EGD) WITH PROPOFOL;  Surgeon: Malissa Hippo, MD;  [facility];  Service: Endoscopy;  Laterality: N/A;  950  . MALONEY DILATION N/A 12/31/2012   Procedure: MALONEY DILATION;  Surgeon: Malissa Hippo, MD;  [facility];  Service: Endoscopy;  Laterality: N/A;  used a # 54,#56  . MALONEY DILATION N/A 07/01/2013   Procedure: Elease Hashimoto DILATION;  Surgeon:  Malissa Hippo, MD;  [facility];  Service: Endoscopy;  Laterality: N/A;  54/58; no heme present  . POLYPECTOMY  02/05/2018   Procedure: POLYPECTOMY;  Surgeon: Malissa Hippo, MD;  [facility];  Service: Endoscopy;;  distal rectum (HS)  . STONE EXTRACTION WITH BASKET Right 12/22/2019   Procedure: STONE EXTRACTION WITH BASKET;  Surgeon: Malen Gauze, MD;  [facility];  Service: Urology;  Laterality: Right;   Past Medical History:  Diagnosis Date  . Anxiety and depression   . Arthritis   . CAD (coronary artery disease)    Stent  placement circumflex coronary 2007, catheterization 2008 patent stents. Normal LV function  . Chest pain   . CHF (congestive heart failure) (HCC)   . COPD (chronic obstructive pulmonary disease) (HCC)    no home O2  . Depression   . Diabetes mellitus    Insulin dependent  . Diabetic polyneuropathy (HCC)     severe on multiple medications  . Dyslipidemia   . GERD (gastroesophageal reflux disease)   . Headache(784.0)   . Heart attack (HCC)   . Heartburn   . 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 High cholesterol   . Hx of blood clots   . Hypertension   . MI (myocardial infarction) (HCC)    2007  . Obstructive sleep apnea    CPAP, setting ?2  . Palpitations   . Sleep apnea   . Tachycardia    BP 135/87   Pulse 80   Ht 5\' 4"  (1.626 m)   Wt 240 lb (108.9 kg)   BMI 41.20 kg/m   Opioid Risk Score:   Fall Risk Score:  `1  Depression screen PHQ 2/9     05/08/2023    1:20 PM 03/25/2023    2:24 PM 02/26/2023    1:01 PM 01/01/2023    1:51 PM 12/04/2022    2:27 PM 11/04/2022   10:59 AM 10/06/2022    9:48 AM  Depression screen PHQ 2/9  Decreased Interest 1 1 1  0 1 0 1  Down, Depressed, Hopeless 1 1 1  0 1 0 1  PHQ - 2 Score 2 2 2  0 2 0 2     Review of Systems  All other systems reviewed and are negative.      Objective:   Physical Exam Vitals and nursing note reviewed.  Musculoskeletal:     Comments: No Physical Exam  Performed: Virtual/ Telephone Visit         Assessment & Plan:  1.Lumbar pain lumbar spondylosis:  Encouraged to continue to increase Activity as tolerated. 02/ 21/2025 Continue current medication regimen. Refilled Oxycodone 10/325 mg one tablet every 6 hours as needed for pain. #120.  We will continue the opioid monitoring program, this consists of regular clinic visits, examinations, urine drug screen, pill counts as well as use of [place] Controlled Substance Reporting system. A 12 month History has been reviewed on the [place] Controlled Substance Reporting System on 05/08/2023.  2. Lumbar Radiculitis: Continue current medication regimen with Gabapentin. 05/08/2023 3. Bilateral Knee Pain/ Degenerative: Continue Voltaren Gel. Continue to Monitor. 05/08/2023 4. Severe diabetic poly neuropathy: Continue current medication regimen with Gabapentin. 05/08/2023 5. Insomnia: No complaints today. Continue to Monitor.  05/08/2023 6.Anxiety/Depression: Continue Current Medication regimen as prescribed by PCP. Marland Kitchen 05/08/2023 7. De Quervains Tenosynovitis: No Complaints Today. 05/08/2023 8. Bilateral  Greater Trochanteric Bursitis: Continue with Ice and Heat Therapy. Continue to Monitor. 05/08/2023.  9. Cervicalgia/ Cervical Radiculitis:Continue Gabapentin. Continue with HEP as Tolerated and continue to monitor. 05/08/2023 10. Chronic Bilateral Thoracic Back Pain:  Continue HEP as Tolerated. Continue current medication regimen. Continue to monitor. 05/08/2023.     F/U in 1 month Virtual Visit/ Telephone Visit Established Patient Location of Patient: In her Home  Location of Provider: In the Office  Video connection was lost at > 50 % of the duration of the visit, at which time the remainder of the visit was completed via telephone only.

## 2023-05-08 ENCOUNTER — Encounter: Payer: Self-pay | Admitting: Registered Nurse

## 2023-05-08 ENCOUNTER — Encounter: Payer: 59 | Attending: Registered Nurse | Admitting: Registered Nurse

## 2023-05-08 VITALS — BP 135/87 | HR 80 | Ht 64.0 in | Wt 240.0 lb

## 2023-05-08 DIAGNOSIS — M48062 Spinal stenosis, lumbar region with neurogenic claudication: Secondary | ICD-10-CM

## 2023-05-08 DIAGNOSIS — M25511 Pain in right shoulder: Secondary | ICD-10-CM | POA: Insufficient documentation

## 2023-05-08 DIAGNOSIS — Z79891 Long term (current) use of opiate analgesic: Secondary | ICD-10-CM

## 2023-05-08 DIAGNOSIS — Z5181 Encounter for therapeutic drug level monitoring: Secondary | ICD-10-CM

## 2023-05-08 DIAGNOSIS — M542 Cervicalgia: Secondary | ICD-10-CM

## 2023-05-08 DIAGNOSIS — M7061 Trochanteric bursitis, right hip: Secondary | ICD-10-CM

## 2023-05-08 DIAGNOSIS — G8929 Other chronic pain: Secondary | ICD-10-CM

## 2023-05-08 DIAGNOSIS — M5412 Radiculopathy, cervical region: Secondary | ICD-10-CM | POA: Diagnosis not present

## 2023-05-08 DIAGNOSIS — M1711 Unilateral primary osteoarthritis, right knee: Secondary | ICD-10-CM | POA: Diagnosis not present

## 2023-05-08 DIAGNOSIS — M7062 Trochanteric bursitis, left hip: Secondary | ICD-10-CM | POA: Insufficient documentation

## 2023-05-08 DIAGNOSIS — M5416 Radiculopathy, lumbar region: Secondary | ICD-10-CM

## 2023-05-08 DIAGNOSIS — G894 Chronic pain syndrome: Secondary | ICD-10-CM | POA: Diagnosis not present

## 2023-05-08 DIAGNOSIS — M1712 Unilateral primary osteoarthritis, left knee: Secondary | ICD-10-CM | POA: Diagnosis not present

## 2023-05-08 DIAGNOSIS — M47896 Other spondylosis, lumbar region: Secondary | ICD-10-CM

## 2023-05-08 DIAGNOSIS — M25512 Pain in left shoulder: Secondary | ICD-10-CM | POA: Insufficient documentation

## 2023-05-14 ENCOUNTER — Telehealth: Payer: Self-pay | Admitting: Registered Nurse

## 2023-05-14 ENCOUNTER — Ambulatory Visit (HOSPITAL_BASED_OUTPATIENT_CLINIC_OR_DEPARTMENT_OTHER): Payer: 59 | Attending: Primary Care | Admitting: Internal Medicine

## 2023-05-14 ENCOUNTER — Other Ambulatory Visit: Payer: Self-pay | Admitting: Registered Nurse

## 2023-05-14 MED ORDER — OXYCODONE-ACETAMINOPHEN 10-325 MG PO TABS: 1.0000 | ORAL_TABLET | Freq: Four times a day (QID) | ORAL | 0 refills | Status: DC | PRN

## 2023-05-14 NOTE — Telephone Encounter (Signed)
 PMP was Reviewed.  Oxycodone e-scribed to pharmacy.  [name] is aware via My-Chart message.

## 2023-05-15 DIAGNOSIS — I509 Heart failure, unspecified: Secondary | ICD-10-CM | POA: Diagnosis not present

## 2023-05-15 DIAGNOSIS — R0902 Hypoxemia: Secondary | ICD-10-CM | POA: Diagnosis not present

## 2023-05-15 DIAGNOSIS — M159 Polyosteoarthritis, unspecified: Secondary | ICD-10-CM | POA: Diagnosis not present

## 2023-05-15 DIAGNOSIS — M797 Fibromyalgia: Secondary | ICD-10-CM | POA: Diagnosis not present

## 2023-05-15 DIAGNOSIS — G4733 Obstructive sleep apnea (adult) (pediatric): Secondary | ICD-10-CM | POA: Diagnosis not present

## 2023-05-18 ENCOUNTER — Other Ambulatory Visit: Payer: Self-pay | Admitting: Physician Assistant

## 2023-05-27 ENCOUNTER — Ambulatory Visit: Payer: 59 | Admitting: Primary Care

## 2023-05-27 ENCOUNTER — Encounter: Payer: Self-pay | Admitting: Primary Care

## 2023-06-11 DIAGNOSIS — Z299 Encounter for prophylactic measures, unspecified: Secondary | ICD-10-CM | POA: Diagnosis not present

## 2023-06-11 DIAGNOSIS — R300 Dysuria: Secondary | ICD-10-CM | POA: Diagnosis not present

## 2023-06-11 DIAGNOSIS — I10 Essential (primary) hypertension: Secondary | ICD-10-CM | POA: Diagnosis not present

## 2023-06-11 DIAGNOSIS — I700 Atherosclerosis of aorta: Secondary | ICD-10-CM | POA: Diagnosis not present

## 2023-06-11 DIAGNOSIS — Z0000 Encounter for general adult medical examination without abnormal findings: Secondary | ICD-10-CM | POA: Diagnosis not present

## 2023-06-11 DIAGNOSIS — Z713 Dietary counseling and surveillance: Secondary | ICD-10-CM | POA: Diagnosis not present

## 2023-06-11 DIAGNOSIS — Z7189 Other specified counseling: Secondary | ICD-10-CM | POA: Diagnosis not present

## 2023-06-11 NOTE — Progress Notes (Unsigned)
 Subjective:    Patient ID: [name], female    DOB: Aug 03, 1950, 73 y.o.   MRN: [id number]  HPI: [name] is a 73 y.o. female who returns for follow up appointment for chronic pain and medication refill. She states her pain is located in her bilateral shoulders, mid- lower back radiating into her bilateral hips, bilateral lower extremities and bilateral feet with tingling and burning. Also reports bilateral knee pain.She rates her pain 9. Her current exercise regime is walking and performing stretching exercises.  Ms. Dvorsky Morphine equivalent is 60.00 MME. She  is also prescribed Alprazolam by Dr. Sherril Croon .We have discussed the black box warning of using opioids and benzodiazepines. I highlighted the dangers of using these drugs together and discussed the adverse events including respiratory suppression, overdose, cognitive impairment and importance of compliance with current regimen. We will continue to monitor and adjust as indicated.   Last Oral Swab was Performed on 02/26/2024, it was consistent.     Pain Inventory Average Pain 8 Pain Right Now 9 My pain is constant  In the last 24 hours, has pain interfered with the following? General activity 9 Relation with others 2 Enjoyment of life 5 What TIME of day is your pain at its worst? morning  Sleep (in general) Poor  Pain is worse with: walking, bending, and sitting Pain improves with: rest and medication Relief from Meds: 3  Family History  Problem Relation Age of Onset   Heart attack Mother    Colon cancer Mother    Heart attack Father    Stroke Sister    Hemophilia Child    Cancer Other    Coronary artery disease Other    Cancer Brother    Cancer Maternal Aunt    Cancer Maternal Uncle    Cancer Paternal Aunt    Cancer Paternal Uncle    Cancer Brother    Social History   Socioeconomic History   Marital status: Divorced    Spouse name: Not on file   Number of children: 4   Years of education: Not on  file   Highest education level: Not on file  Occupational History   Occupation: Disabled  Tobacco Use   Smoking status: Never   Smokeless tobacco: Never  Vaping Use   Vaping status: Never Used  Substance and Sexual Activity   Alcohol use: No    Alcohol/week: 0.0 standard drinks of alcohol   Drug use: No   Sexual activity: Not on file  Other Topics Concern   Not on file  Social History Narrative   Patient does not get regular exercise   Denies caffeine use    Social Drivers of Corporate investment banker Strain: Low Risk  (07/21/2022)   Overall Financial Resource Strain (CARDIA)    Difficulty of Paying Living Expenses: Not very hard  Food Insecurity: No Food Insecurity (07/21/2022)   Hunger Vital Sign    Worried About Running Out of Food in the Last Year: Never true    Ran Out of Food in the Last Year: Never true  Transportation Needs: No Transportation Needs (07/21/2022)   PRAPARE - Administrator, Civil Service (Medical): No    Lack of Transportation (Non-Medical): No  Physical Activity: Inactive (07/21/2022)   Exercise Vital Sign    Days of Exercise per Week: 0 days    Minutes of Exercise per Session: 0 min  Stress: Stress Concern Present (10/18/2018)   [facility], [facility]   [facility] - Occupational Stress Questionnaire    Feeling of Stress : To some extent  Social Connections: Moderately Integrated (10/18/2018)   [facility], [facility]   Social Connection and Isolation Panel [NHANES]    Frequency of Communication with Friends and Family: More than three times a week    Frequency of Social Gatherings with Friends and Family: More than three times a week    Attends Religious Services: More than 4 times per year    Active Member of [employer] or Organizations: No    Attends Banker Meetings: Never    Marital Status: Living with partner   Past Surgical History:  Procedure  Laterality Date   ABDOMINAL HYSTERECTOMY     Arm Surgery Bilateral    due to fall-pt broke both forearms   BIOPSY N/A 07/01/2013   Procedure: BIOPSY;  Surgeon: Malissa Hippo, MD;  Location: [facility];  Service: Endoscopy;  Laterality: N/A;   CHOLECYSTECTOMY     COLONOSCOPY  08/06/2011   Procedure: COLONOSCOPY;  Surgeon: Malissa Hippo, MD;  [facility];  Service: Endoscopy;  Laterality: N/A;  215   COLONOSCOPY WITH PROPOFOL N/A 12/31/2012   Procedure: COLONOSCOPY WITH PROPOFOL;  Surgeon: Malissa Hippo, MD;  Location: [facility];  Service: Endoscopy;  Laterality: N/A;  in cecum at 0814, total withdrawal time   COLONOSCOPY WITH PROPOFOL N/A 02/05/2018   Procedure: COLONOSCOPY WITH PROPOFOL;  Surgeon: Malissa Hippo, MD;  [facility];  Service: Endoscopy;  Laterality: N/A;  1:25   CORONARY ANGIOPLASTY WITH STENT PLACEMENT     CYSTOSCOPY W/ URETERAL STENT PLACEMENT Right 11/10/2019   at [facility]   CYSTOSCOPY WITH RETROGRADE PYELOGRAM, URETEROSCOPY AND STENT PLACEMENT Right 12/22/2019   Procedure: CYSTOSCOPY WITH RIGHT URETERAL STENT REMOVAL; RIGHT RETROGRADE PYELOGRAM,RIGHT URETEROSCOPY;  Surgeon: Malen Gauze, MD;  Location: [facility];  Service: Urology;  Laterality: Right;   ESOPHAGEAL DILATION N/A 06/07/2015   Procedure: ESOPHAGEAL DILATION;  Surgeon: Malissa Hippo, MD;  [facility];  Service: Endoscopy;  Laterality: N/A;   ESOPHAGOGASTRODUODENOSCOPY N/A 06/07/2015   Procedure: ESOPHAGOGASTRODUODENOSCOPY (EGD);  Surgeon: Malissa Hippo, MD;  [facility];  Service: Endoscopy;  Laterality: N/A;  2:45 - moved to 1:00 - Ann to notify pt   ESOPHAGOGASTRODUODENOSCOPY (EGD) WITH PROPOFOL N/A 12/31/2012   Procedure: ESOPHAGOGASTRODUODENOSCOPY (EGD) WITH PROPOFOL;  Surgeon: Malissa Hippo, MD;  [facility]: [facility];  Service: Endoscopy;  Laterality: N/A;  GE junction 37,   ESOPHAGOGASTRODUODENOSCOPY (EGD) WITH PROPOFOL N/A 07/01/2013   Procedure:  ESOPHAGOGASTRODUODENOSCOPY (EGD) WITH PROPOFOL;  Surgeon: Malissa Hippo, MD;  [facility]: [facility];  Service: Endoscopy;  Laterality: N/A;  950   MALONEY DILATION N/A 12/31/2012   Procedure: MALONEY DILATION;  Surgeon: Malissa Hippo, MD;  [facility]: [facility];  Service: Endoscopy;  Laterality: N/A;  used a # 54,#56   MALONEY DILATION N/A 07/01/2013   Procedure: MALONEY DILATION;  Surgeon: Malissa Hippo, MD;  [facility]: [facility];  Service: Endoscopy;  Laterality: N/A;  54/58; no heme present   POLYPECTOMY  02/05/2018   Procedure: POLYPECTOMY;  Surgeon: Malissa Hippo, MD;  [facility];  Service: Endoscopy;;  distal rectum (HS)   STONE EXTRACTION WITH BASKET Right 12/22/2019   Procedure: STONE EXTRACTION WITH BASKET;  Surgeon: Malen Gauze, MD;  [facility]: [facility];  Service: Urology;  Laterality: Right;  Past Surgical History:  Procedure Laterality Date   ABDOMINAL HYSTERECTOMY     Arm Surgery Bilateral    due to fall-pt broke both forearms   BIOPSY N/A 07/01/2013   Procedure: BIOPSY;  Surgeon: Malissa Hippo, MD;  [facility];  Service: Endoscopy;  Laterality: N/A;   CHOLECYSTECTOMY     COLONOSCOPY  08/06/2011   Procedure: COLONOSCOPY;  Surgeon: Malissa Hippo, MD;  [facility];  Service: Endoscopy;  Laterality: N/A;  215   COLONOSCOPY WITH PROPOFOL N/A 12/31/2012   Procedure: COLONOSCOPY WITH PROPOFOL;  Surgeon: Malissa Hippo, MD;  [facility]: [facility];  Service: Endoscopy;  Laterality: N/A;  in cecum at 0814, total withdrawal time   COLONOSCOPY WITH PROPOFOL N/A 02/05/2018   Procedure: COLONOSCOPY WITH PROPOFOL;  Surgeon: Malissa Hippo, MD;  [facility];  Service: Endoscopy;  Laterality: N/A;  1:25   CORONARY ANGIOPLASTY WITH STENT PLACEMENT     CYSTOSCOPY W/ URETERAL STENT PLACEMENT Right 11/10/2019   at [facility]   CYSTOSCOPY WITH RETROGRADE PYELOGRAM, URETEROSCOPY AND STENT PLACEMENT Right 12/22/2019   Procedure: CYSTOSCOPY WITH RIGHT  URETERAL STENT REMOVAL; RIGHT RETROGRADE PYELOGRAM,RIGHT URETEROSCOPY;  Surgeon: Malen Gauze, MD;  [facility]: [facility];  Service: Urology;  Laterality: Right;   ESOPHAGEAL DILATION N/A 06/07/2015   Procedure: ESOPHAGEAL DILATION;  Surgeon: Malissa Hippo, MD;  [facility];  Service: Endoscopy;  Laterality: N/A;   ESOPHAGOGASTRODUODENOSCOPY N/A 06/07/2015   Procedure: ESOPHAGOGASTRODUODENOSCOPY (EGD);  Surgeon: Malissa Hippo, MD;  [facility];  Service: Endoscopy;  Laterality: N/A;  2:45 - moved to 1:00 - Ann to notify pt   ESOPHAGOGASTRODUODENOSCOPY (EGD) WITH PROPOFOL N/A 12/31/2012   Procedure: ESOPHAGOGASTRODUODENOSCOPY (EGD) WITH PROPOFOL;  Surgeon: Malissa Hippo, MD;  [facility];  Service: Endoscopy;  Laterality: N/A;  GE junction 37,   ESOPHAGOGASTRODUODENOSCOPY (EGD) WITH PROPOFOL N/A 07/01/2013   Procedure: ESOPHAGOGASTRODUODENOSCOPY (EGD) WITH PROPOFOL;  Surgeon: Malissa Hippo, MD;  [facility]: [facility];  Service: Endoscopy;  Laterality: N/A;  950   MALONEY DILATION N/A 12/31/2012   Procedure: MALONEY DILATION;  Surgeon: Malissa Hippo, MD;  [facility];  Service: Endoscopy;  Laterality: N/A;  used a # 54,#56   MALONEY DILATION N/A 07/01/2013   Procedure: MALONEY DILATION;  Surgeon: Malissa Hippo, MD;  [facility]: [facility];  Service: Endoscopy;  Laterality: N/A;  54/58; no heme present   POLYPECTOMY  02/05/2018   Procedure: POLYPECTOMY;  Surgeon: Malissa Hippo, MD;  [facility];  Service: Endoscopy;;  distal rectum (HS)   STONE EXTRACTION WITH BASKET Right 12/22/2019   Procedure: STONE EXTRACTION WITH BASKET;  Surgeon: Malen Gauze, MD;  [facility]: [facility];  Service: Urology;  Laterality: Right;   Past Medical History:  Diagnosis Date   Anxiety and depression    Arthritis    CAD (coronary artery disease)    Stent placement circumflex coronary 2007, catheterization 2008 patent stents. 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  Opioid Risk Score:   Fall Risk Score:  `1  Depression screen PHQ 2/9     05/08/2023    1:20 PM 03/25/2023    2:24 PM 02/26/2023    1:01 PM 01/01/2023    1:51 PM 12/04/2022    2:27 PM 11/04/2022   10:59 AM 10/06/2022    9:48 AM  Depression screen PHQ 2/9  Decreased Interest 1 1 1  0 1 0 1  Down, Depressed, Hopeless 1 1 1  0 1 0 1  PHQ - 2 Score 2 2 2  0 2 0 2     Review of Systems     Objective:   Physical Exam Vitals and nursing note reviewed.  Constitutional:      Appearance: Normal appearance.  Cardiovascular:     Rate and Rhythm: Normal rate and regular rhythm.     Pulses: Normal pulses.     Heart sounds: Normal heart sounds.  Pulmonary:     Effort: Pulmonary effort is normal.     Breath sounds: Normal breath sounds.  Musculoskeletal:     Right lower leg: Edema present.     Left lower leg: Edema present.     Comments: Normal Muscle Bulk and Muscle Testing Reveals:  Upper Extremities:Full  ROM and Muscle Strength 5/5 Bilateral AC Joint Tenderness  Thoracic Paraspinal Tenderness: T-7-T-10  Lumbar Hypersensitivity Bilateral Greater Trochanter Tenderness Lower Extremities: Full ROM and Muscle Strength 5/5 Bilateral Lower Extremities Flexion Produces Pain into her Bilateral Hips Arises from Chair slowly using walker or support Antalgic  Gait     Skin:    General:  Skin is warm and dry.  Neurological:     Mental Status: She is alert and oriented to person, place, and time.  Psychiatric:        Mood and Affect: Mood normal.        Behavior: Behavior normal.         Assessment & Plan:  1.Lumbar pain lumbar spondylosis:  Encouraged to continue to increase Activity as tolerated. 03/ 28/2025 Continue current medication regimen. Refilled Oxycodone 10/325 mg one tablet every 6 hours as needed for pain. #120.  We will continue the opioid monitoring program, this consists of regular clinic visits, examinations, urine drug screen, pill counts as well as use of [place] Controlled Substance Reporting system. A 12 month History has been reviewed on the [place] Controlled Substance Reporting System on 06/12/2023.  2. Lumbar Radiculitis: Continue current medication regimen with Gabapentin. 06/12/2023 3. Bilateral Knee Pain/ Degenerative: Continue Voltaren Gel. Continue to Monitor. 06/12/2023 4. Severe diabetic poly neuropathy: Continue current medication regimen with Gabapentin. 06/12/2023 5. Insomnia: No complaints today. Continue to Monitor.  06/12/2023 6.Anxiety/Depression: Continue Current Medication regimen as prescribed by PCP. Marland Kitchen 06/12/2023 7. De Quervains Tenosynovitis: No Complaints Today. 06/12/2023 8. Bilateral  Greater Trochanteric Bursitis: Continue with Ice and Heat Therapy. Continue to Monitor. 06/12/2023.  9. Cervicalgia/ Cervical Radiculitis:No complaints today. Continue Gabapentin. Continue with HEP as Tolerated and continue to monitor. 06/12/2023 10. Chronic Bilateral Thoracic Back Pain:  Continue HEP as Tolerated. Continue current medication regimen. Continue to monitor. 06/12/2023. 11. Essential Hypertension: Blood Pressure was re-checked. Ms.  Pau reports she is compliant with her anti-hypertensive medication, PCP following. Continue to monitor.   F/U in 1 month

## 2023-06-12 ENCOUNTER — Encounter: Payer: Self-pay | Admitting: Registered Nurse

## 2023-06-12 ENCOUNTER — Encounter: Payer: 59 | Attending: Registered Nurse | Admitting: Registered Nurse

## 2023-06-12 VITALS — BP 156/81 | HR 73 | Wt 252.4 lb

## 2023-06-12 DIAGNOSIS — M1712 Unilateral primary osteoarthritis, left knee: Secondary | ICD-10-CM | POA: Insufficient documentation

## 2023-06-12 DIAGNOSIS — I10 Essential (primary) hypertension: Secondary | ICD-10-CM | POA: Insufficient documentation

## 2023-06-12 DIAGNOSIS — M1711 Unilateral primary osteoarthritis, right knee: Secondary | ICD-10-CM | POA: Diagnosis not present

## 2023-06-12 DIAGNOSIS — G8929 Other chronic pain: Secondary | ICD-10-CM | POA: Diagnosis not present

## 2023-06-12 DIAGNOSIS — M25511 Pain in right shoulder: Secondary | ICD-10-CM | POA: Diagnosis not present

## 2023-06-12 DIAGNOSIS — Z79891 Long term (current) use of opiate analgesic: Secondary | ICD-10-CM | POA: Diagnosis not present

## 2023-06-12 DIAGNOSIS — M7061 Trochanteric bursitis, right hip: Secondary | ICD-10-CM | POA: Insufficient documentation

## 2023-06-12 DIAGNOSIS — M7062 Trochanteric bursitis, left hip: Secondary | ICD-10-CM | POA: Insufficient documentation

## 2023-06-12 DIAGNOSIS — M546 Pain in thoracic spine: Secondary | ICD-10-CM | POA: Insufficient documentation

## 2023-06-12 DIAGNOSIS — M25512 Pain in left shoulder: Secondary | ICD-10-CM | POA: Diagnosis not present

## 2023-06-12 DIAGNOSIS — M5416 Radiculopathy, lumbar region: Secondary | ICD-10-CM | POA: Insufficient documentation

## 2023-06-12 DIAGNOSIS — Z5181 Encounter for therapeutic drug level monitoring: Secondary | ICD-10-CM | POA: Insufficient documentation

## 2023-06-12 DIAGNOSIS — G894 Chronic pain syndrome: Secondary | ICD-10-CM | POA: Diagnosis not present

## 2023-06-12 MED ORDER — OXYCODONE-ACETAMINOPHEN 10-325 MG PO TABS: 1.0000 | ORAL_TABLET | Freq: Four times a day (QID) | ORAL | 0 refills | Status: DC | PRN

## 2023-06-14 DIAGNOSIS — R0902 Hypoxemia: Secondary | ICD-10-CM | POA: Diagnosis not present

## 2023-06-14 DIAGNOSIS — I509 Heart failure, unspecified: Secondary | ICD-10-CM | POA: Diagnosis not present

## 2023-06-14 DIAGNOSIS — M159 Polyosteoarthritis, unspecified: Secondary | ICD-10-CM | POA: Diagnosis not present

## 2023-06-14 DIAGNOSIS — M797 Fibromyalgia: Secondary | ICD-10-CM | POA: Diagnosis not present

## 2023-06-14 DIAGNOSIS — G4733 Obstructive sleep apnea (adult) (pediatric): Secondary | ICD-10-CM | POA: Diagnosis not present

## 2023-06-16 DIAGNOSIS — I509 Heart failure, unspecified: Secondary | ICD-10-CM | POA: Diagnosis not present

## 2023-06-24 DIAGNOSIS — R1084 Generalized abdominal pain: Secondary | ICD-10-CM | POA: Diagnosis not present

## 2023-06-24 DIAGNOSIS — R109 Unspecified abdominal pain: Secondary | ICD-10-CM | POA: Diagnosis not present

## 2023-07-06 ENCOUNTER — Other Ambulatory Visit: Payer: Self-pay | Admitting: Neurology

## 2023-07-14 ENCOUNTER — Encounter: Payer: Self-pay | Admitting: Registered Nurse

## 2023-07-14 ENCOUNTER — Encounter: Attending: Registered Nurse | Admitting: Registered Nurse

## 2023-07-14 ENCOUNTER — Other Ambulatory Visit: Payer: Self-pay | Admitting: Cardiology

## 2023-07-14 VITALS — BP 149/80 | HR 86 | Ht 64.0 in | Wt 251.8 lb

## 2023-07-14 DIAGNOSIS — Z79891 Long term (current) use of opiate analgesic: Secondary | ICD-10-CM | POA: Insufficient documentation

## 2023-07-14 DIAGNOSIS — Z5181 Encounter for therapeutic drug level monitoring: Secondary | ICD-10-CM | POA: Diagnosis not present

## 2023-07-14 DIAGNOSIS — M1712 Unilateral primary osteoarthritis, left knee: Secondary | ICD-10-CM | POA: Diagnosis not present

## 2023-07-14 DIAGNOSIS — M5416 Radiculopathy, lumbar region: Secondary | ICD-10-CM | POA: Insufficient documentation

## 2023-07-14 DIAGNOSIS — M546 Pain in thoracic spine: Secondary | ICD-10-CM | POA: Insufficient documentation

## 2023-07-14 DIAGNOSIS — G894 Chronic pain syndrome: Secondary | ICD-10-CM | POA: Diagnosis not present

## 2023-07-14 DIAGNOSIS — M25511 Pain in right shoulder: Secondary | ICD-10-CM | POA: Diagnosis not present

## 2023-07-14 DIAGNOSIS — M7062 Trochanteric bursitis, left hip: Secondary | ICD-10-CM | POA: Insufficient documentation

## 2023-07-14 DIAGNOSIS — M7061 Trochanteric bursitis, right hip: Secondary | ICD-10-CM | POA: Insufficient documentation

## 2023-07-14 DIAGNOSIS — M1711 Unilateral primary osteoarthritis, right knee: Secondary | ICD-10-CM | POA: Diagnosis not present

## 2023-07-14 DIAGNOSIS — M25512 Pain in left shoulder: Secondary | ICD-10-CM | POA: Diagnosis not present

## 2023-07-14 DIAGNOSIS — G8929 Other chronic pain: Secondary | ICD-10-CM | POA: Diagnosis not present

## 2023-07-14 MED ORDER — OXYCODONE-ACETAMINOPHEN 10-325 MG PO TABS: 1.0000 | ORAL_TABLET | Freq: Four times a day (QID) | ORAL | 0 refills | Status: DC | PRN

## 2023-07-14 NOTE — Progress Notes (Signed)
 Subjective:    Patient ID: [name], female    DOB: [birth year], 73 y.o.   MRN: [id number]  HPI: [name] is a 73 y.o. female who returns for follow up appointment for chronic pain and medication refill. She states her pain is located in her  bilateral shoulders, mid- lower back radiating into her bilateral hips, bilateral lower extremities and bilateral feet. Also reports bilateral knee pain. She rates her pain 8. Her current exercise regime is walking and performing stretching exercises.  [name] Morphine  equivalent is 60.00 MME. She is also prescribed Alprazolam   by Dr. Darien Eden .We have discussed the black box warning of using opioids and benzodiazepines. I highlighted the dangers of using these drugs together and discussed the adverse events including respiratory suppression, overdose, cognitive impairment and importance of compliance with current regimen. We will continue to monitor and adjust as indicated.     Oral Swab was Ordered today.    Pain Inventory Average Pain 5 Pain Right Now 8 My pain is sharp, burning, stabbing, tingling, and aching  In the last 24 hours, has pain interfered with the following? General activity 5 Relation with others 5 Enjoyment of life 5 What TIME of day is your pain at its worst? morning  and night Sleep (in general) Fair  Pain is worse with: walking Pain improves with: rest and medication Relief from Meds: 5  Family History  Problem Relation Age of Onset   Heart attack Mother    Colon cancer Mother    Heart attack Father    Stroke Sister    Hemophilia Child    Cancer Other    Coronary artery disease Other    Cancer Brother    Cancer Maternal Aunt    Cancer Maternal Uncle    Cancer Paternal Aunt    Cancer Paternal Uncle    Cancer Brother    Social History   Socioeconomic History   Marital status: Divorced    Spouse name: Not on file   Number of children: 4   Years of education: Not on file   Highest education level:  Not on file  Occupational History   Occupation: Disabled  Tobacco Use   Smoking status: Never   Smokeless tobacco: Never  Vaping Use   Vaping status: Never Used  Substance and Sexual Activity   Alcohol  use: No    Alcohol /week: 0.0 standard drinks of alcohol    Drug use: No   Sexual activity: Not on file  Other Topics Concern   Not on file  Social History Narrative   Patient does not get regular exercise   Denies caffeine use    Social Drivers of Corporate investment banker Strain: Low Risk  (07/21/2022)   Overall Financial Resource Strain (CARDIA)    Difficulty of Paying Living Expenses: Not very hard  Food Insecurity: No Food Insecurity (07/21/2022)   Hunger Vital Sign    Worried About Running Out of Food in the Last Year: Never true    Ran Out of Food in the Last Year: Never true  Transportation Needs: No Transportation Needs (07/21/2022)   PRAPARE - Administrator, Civil Service (Medical): No    Lack of Transportation (Non-Medical): No  Physical Activity: Inactive (07/21/2022)   Exercise Vital Sign    Days of Exercise per Week: 0 days    Minutes of Exercise per Session: 0 min  Stress: Stress Concern Present (10/18/2018)   Received from [facility], [facility]   [facility] - Occupational Stress Questionnaire    Feeling of Stress : To some extent  Social Connections: Moderately Integrated (10/18/2018)   Received from [facility], [facility]   Social Connection and Isolation Panel [NHANES]    Frequency of Communication with Friends and Family: More than three times a week    Frequency of Social Gatherings with Friends and Family: More than three times a week    Attends Religious Services: More than 4 times per year    Active Member of [employer] or Organizations: No    Attends Banker Meetings: Never    Marital Status: Living with partner   Past Surgical History:  Procedure Laterality Date   ABDOMINAL  HYSTERECTOMY     Arm Surgery Bilateral    due to fall-pt broke both forearms   BIOPSY N/A 07/01/2013   Procedure: BIOPSY;  Surgeon: Ruby Corporal, MD;  Location: [facility];  Service: Endoscopy;  Laterality: N/A;   CHOLECYSTECTOMY     COLONOSCOPY  08/06/2011   Procedure: COLONOSCOPY;  Surgeon: Ruby Corporal, MD;  [facility];  Service: Endoscopy;  Laterality: N/A;  215   COLONOSCOPY WITH PROPOFOL  N/A 12/31/2012   Procedure: COLONOSCOPY WITH PROPOFOL ;  Surgeon: Ruby Corporal, MD;  Location: [facility];  Service: Endoscopy;  Laterality: N/A;  in cecum at 0814, total withdrawal time   COLONOSCOPY WITH PROPOFOL  N/A 02/05/2018   Procedure: COLONOSCOPY WITH PROPOFOL ;  Surgeon: Ruby Corporal, MD;  [facility];  Service: Endoscopy;  Laterality: N/A;  1:25   CORONARY ANGIOPLASTY WITH STENT PLACEMENT     CYSTOSCOPY W/ URETERAL STENT PLACEMENT Right 11/10/2019   at [facility]   CYSTOSCOPY WITH RETROGRADE PYELOGRAM, URETEROSCOPY AND STENT PLACEMENT Right 12/22/2019   Procedure: CYSTOSCOPY WITH RIGHT URETERAL STENT REMOVAL; RIGHT RETROGRADE PYELOGRAM,RIGHT URETEROSCOPY;  Surgeon: Marco Severs, MD;  Location: [facility];  Service: Urology;  Laterality: Right;   ESOPHAGEAL DILATION N/A 06/07/2015   Procedure: ESOPHAGEAL DILATION;  Surgeon: Ruby Corporal, MD;  [facility];  Service: Endoscopy;  Laterality: N/A;   ESOPHAGOGASTRODUODENOSCOPY N/A 06/07/2015   Procedure: ESOPHAGOGASTRODUODENOSCOPY (EGD);  Surgeon: Ruby Corporal, MD;  [facility];  Service: Endoscopy;  Laterality: N/A;  2:45 - moved to 1:00 - Ann to notify pt   ESOPHAGOGASTRODUODENOSCOPY (EGD) WITH PROPOFOL  N/A 12/31/2012   Procedure: ESOPHAGOGASTRODUODENOSCOPY (EGD) WITH PROPOFOL ;  Surgeon: Ruby Corporal, MD;  [facility]: [facility];  Service: Endoscopy;  Laterality: N/A;  GE junction 37,   ESOPHAGOGASTRODUODENOSCOPY (EGD) WITH PROPOFOL  N/A 07/01/2013   Procedure: ESOPHAGOGASTRODUODENOSCOPY (EGD)  WITH PROPOFOL ;  Surgeon: Ruby Corporal, MD;  [facility]: [facility];  Service: Endoscopy;  Laterality: N/A;  950   MALONEY DILATION N/A 12/31/2012   Procedure: MALONEY DILATION;  Surgeon: Ruby Corporal, MD;  [facility]: [facility];  Service: Endoscopy;  Laterality: N/A;  used a # 54,#56   MALONEY DILATION N/A 07/01/2013   Procedure: MALONEY DILATION;  Surgeon: Ruby Corporal, MD;  [facility]: [facility];  Service: Endoscopy;  Laterality: N/A;  54/58; no heme present   POLYPECTOMY  02/05/2018   Procedure: POLYPECTOMY;  Surgeon: Ruby Corporal, MD;  [facility];  Service: Endoscopy;;  distal rectum (HS)   STONE EXTRACTION WITH BASKET Right 12/22/2019   Procedure: STONE EXTRACTION WITH BASKET;  Surgeon: Marco Severs, MD;  [facility]: [facility];  Service: Urology;  Laterality: Right;   Past  Surgical History:  Procedure Laterality Date   ABDOMINAL HYSTERECTOMY     Arm Surgery Bilateral    due to fall-pt broke both forearms   BIOPSY N/A 07/01/2013   Procedure: BIOPSY;  Surgeon: Ruby Corporal, MD;  [facility];  Service: Endoscopy;  Laterality: N/A;   CHOLECYSTECTOMY     COLONOSCOPY  08/06/2011   Procedure: COLONOSCOPY;  Surgeon: Ruby Corporal, MD;  [facility];  Service: Endoscopy;  Laterality: N/A;  215   COLONOSCOPY WITH PROPOFOL  N/A 12/31/2012   Procedure: COLONOSCOPY WITH PROPOFOL ;  Surgeon: Ruby Corporal, MD;  [facility]: [facility];  Service: Endoscopy;  Laterality: N/A;  in cecum at 0814, total withdrawal time   COLONOSCOPY WITH PROPOFOL  N/A 02/05/2018   Procedure: COLONOSCOPY WITH PROPOFOL ;  Surgeon: Ruby Corporal, MD;  [facility];  Service: Endoscopy;  Laterality: N/A;  1:25   CORONARY ANGIOPLASTY WITH STENT PLACEMENT     CYSTOSCOPY W/ URETERAL STENT PLACEMENT Right 11/10/2019   at [facility]   CYSTOSCOPY WITH RETROGRADE PYELOGRAM, URETEROSCOPY AND STENT PLACEMENT Right 12/22/2019   Procedure: CYSTOSCOPY WITH RIGHT URETERAL STENT REMOVAL; RIGHT RETROGRADE  PYELOGRAM,RIGHT URETEROSCOPY;  Surgeon: Marco Severs, MD;  [facility]: [facility];  Service: Urology;  Laterality: Right;   ESOPHAGEAL DILATION N/A 06/07/2015   Procedure: ESOPHAGEAL DILATION;  Surgeon: Ruby Corporal, MD;  [facility];  Service: Endoscopy;  Laterality: N/A;   ESOPHAGOGASTRODUODENOSCOPY N/A 06/07/2015   Procedure: ESOPHAGOGASTRODUODENOSCOPY (EGD);  Surgeon: Ruby Corporal, MD;  [facility];  Service: Endoscopy;  Laterality: N/A;  2:45 - moved to 1:00 - Ann to notify pt   ESOPHAGOGASTRODUODENOSCOPY (EGD) WITH PROPOFOL  N/A 12/31/2012   Procedure: ESOPHAGOGASTRODUODENOSCOPY (EGD) WITH PROPOFOL ;  Surgeon: Ruby Corporal, MD;  [facility]: [facility];  Service: Endoscopy;  Laterality: N/A;  GE junction 37,   ESOPHAGOGASTRODUODENOSCOPY (EGD) WITH PROPOFOL  N/A 07/01/2013   Procedure: ESOPHAGOGASTRODUODENOSCOPY (EGD) WITH PROPOFOL ;  Surgeon: Ruby Corporal, MD;  [facility]: [facility];  Service: Endoscopy;  Laterality: N/A;  950   MALONEY DILATION N/A 12/31/2012   Procedure: MALONEY DILATION;  Surgeon: Ruby Corporal, MD;  [facility]: [facility];  Service: Endoscopy;  Laterality: N/A;  used a # 54,#56   MALONEY DILATION N/A 07/01/2013   Procedure: MALONEY DILATION;  Surgeon: Ruby Corporal, MD;  [facility]: [facility];  Service: Endoscopy;  Laterality: N/A;  54/58; no heme present   POLYPECTOMY  02/05/2018   Procedure: POLYPECTOMY;  Surgeon: Ruby Corporal, MD;  [facility];  Service: Endoscopy;;  distal rectum (HS)   STONE EXTRACTION WITH BASKET Right 12/22/2019   Procedure: STONE EXTRACTION WITH BASKET;  Surgeon: Marco Severs, MD;  Location: [facility];  Service: Urology;  Laterality: Right;   Past Medical History:  Diagnosis Date   Anxiety and depression    Arthritis    CAD (coronary artery disease)    Stent placement circumflex coronary 2007, catheterization 2008 patent stents. 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  High cholesterol    Hx of blood clots    Hypertension    MI (myocardial infarction) (HCC)    2007   Obstructive sleep apnea    CPAP, setting ?2   Palpitations    Sleep apnea    Tachycardia    BP (!) 149/80   Pulse 86   Ht 5\' 4"  (1.626 m)   Wt 251 lb 12.8 oz (114.2 kg)   SpO2 98%   BMI 43.22 kg/m   Opioid Risk Score:   Fall Risk Score:  `1  Depression screen [facility] 2/9     07/14/2023   12:55 PM 06/12/2023   10:51 AM 05/08/2023    1:20 PM 03/25/2023    2:24 PM 02/26/2023    1:01 PM 01/01/2023    1:51 PM 12/04/2022    2:27 PM  Depression screen PHQ 2/9  Decreased Interest 1 1 1 1 1  0 1  Down, Depressed, Hopeless 1 1 1 1 1  0 1  PHQ - 2 Score 2 2 2 2 2  0 2  Altered sleeping  2       Tired, decreased energy  2       Change in appetite  1       Feeling bad or failure about yourself   0       Trouble concentrating  0       Moving slowly or fidgety/restless  0       Suicidal thoughts  0       PHQ-9 Score  7          Review of Systems  Musculoskeletal:  Positive for arthralgias, back pain, gait problem, myalgias and neck pain.  Psychiatric/Behavioral:  Positive for dysphoric mood.   All other systems reviewed and are negative.      Objective:   Physical Exam Vitals and nursing note reviewed.  Constitutional:      Appearance: Normal appearance.  Neck:     Comments: Cervical Paraspinal Tenderness: C-5-C-6 Cardiovascular:     Rate and Rhythm: Normal rate and regular rhythm.     Pulses: Normal pulses.     Heart sounds: Normal heart sounds.  Pulmonary:     Effort: Pulmonary effort is normal.     Breath sounds: Normal breath sounds.   Musculoskeletal:     Comments: Normal Muscle Bulk and Muscle Testing Reveals:  Upper Extremities: Full ROM and Muscle Strength 5/5 Bilateral AC Joint Tenderness  Lumbar Hypersensitivity Bilateral Greater Trochanter Tenderness Lower Extremities: Full ROM and Muscle Strength 5/5 Bilateral Lower Extremity Flexion Produces Pain into her Bilateral Patella's Arises from chair slowly using walker for support Antalgic  Gait     Skin:    General: Skin is warm and dry.  Neurological:     Mental Status: She is alert and oriented to person, place, and time.  Psychiatric:        Mood and Affect: Mood normal.        Behavior: Behavior normal.         Assessment & Plan:  1.Lumbar pain lumbar spondylosis:  Encouraged to continue to increase Activity as tolerated. 04/ 29/2025 Continue current medication regimen. Refilled Oxycodone  10/325 mg one tablet every 6 hours as needed for pain. #120.  We will continue the opioid monitoring program, this consists of regular clinic visits, examinations, urine drug screen, pill counts as well as use of [place]  Controlled Substance Reporting system. A 12 month History has been reviewed on the [place]  Controlled  Substance Reporting System on 07/14/2023.  2. Lumbar Radiculitis: Continue current medication regimen with Gabapentin . 07/14/2023 3. Bilateral Knee Pain/ Degenerative: Continue Voltaren  Gel. Continue to Monitor. 07/14/2023 4. Severe diabetic poly neuropathy: Continue current medication regimen with Gabapentin . 07/14/2023 5. Insomnia: No complaints today. Continue to Monitor.  07/14/2023 6.Anxiety/Depression: Continue Current Medication regimen as prescribed by PCP. Aaron Aas 07/14/2023 7. De Quervains Tenosynovitis: No Complaints Today. 07/14/2023 8. Bilateral  Greater Trochanteric Bursitis: Continue with Ice and Heat Therapy. Continue to Monitor. 07/14/2023.  9. Cervicalgia/ Cervical Radiculitis:No complaints today. Continue Gabapentin . Continue  with HEP as Tolerated and continue to monitor. 07/14/2023 10. Chronic Bilateral Thoracic Back Pain:  Continue HEP as Tolerated. Continue current medication regimen. Continue to monitor. 07/14/2023.   F/U in 1 month

## 2023-07-15 DIAGNOSIS — R110 Nausea: Secondary | ICD-10-CM | POA: Diagnosis not present

## 2023-07-15 DIAGNOSIS — Z299 Encounter for prophylactic measures, unspecified: Secondary | ICD-10-CM | POA: Diagnosis not present

## 2023-07-15 DIAGNOSIS — G4733 Obstructive sleep apnea (adult) (pediatric): Secondary | ICD-10-CM | POA: Diagnosis not present

## 2023-07-15 DIAGNOSIS — M797 Fibromyalgia: Secondary | ICD-10-CM | POA: Diagnosis not present

## 2023-07-15 DIAGNOSIS — R0902 Hypoxemia: Secondary | ICD-10-CM | POA: Diagnosis not present

## 2023-07-15 DIAGNOSIS — M159 Polyosteoarthritis, unspecified: Secondary | ICD-10-CM | POA: Diagnosis not present

## 2023-07-15 DIAGNOSIS — E1165 Type 2 diabetes mellitus with hyperglycemia: Secondary | ICD-10-CM | POA: Diagnosis not present

## 2023-07-15 DIAGNOSIS — I509 Heart failure, unspecified: Secondary | ICD-10-CM | POA: Diagnosis not present

## 2023-07-15 DIAGNOSIS — I10 Essential (primary) hypertension: Secondary | ICD-10-CM | POA: Diagnosis not present

## 2023-07-15 DIAGNOSIS — R52 Pain, unspecified: Secondary | ICD-10-CM | POA: Diagnosis not present

## 2023-07-16 DIAGNOSIS — I509 Heart failure, unspecified: Secondary | ICD-10-CM | POA: Diagnosis not present

## 2023-07-19 LAB — DRUG TOX MONITOR 1 W/CONF, ORAL FLD
Amphetamines: NEGATIVE ng/mL (ref <10)
Barbiturates: NEGATIVE ng/mL (ref <10)
Benzodiazepines: NEGATIVE ng/mL (ref <0.50)
Buprenorphine: NEGATIVE ng/mL (ref <0.10)
Cocaine: NEGATIVE ng/mL (ref <5.0)
Codeine: NEGATIVE ng/mL (ref <2.5)
Dihydrocodeine: NEGATIVE ng/mL (ref <2.5)
Fentanyl: NEGATIVE ng/mL (ref <0.10)
Heroin Metabolite: NEGATIVE ng/mL (ref <1.0)
Hydrocodone: NEGATIVE ng/mL (ref <2.5)
Hydromorphone: NEGATIVE ng/mL (ref <2.5)
MARIJUANA: NEGATIVE ng/mL (ref <2.5)
MDMA: NEGATIVE ng/mL (ref <10)
Meprobamate: NEGATIVE ng/mL (ref <2.5)
Methadone: NEGATIVE ng/mL (ref <5.0)
Morphine: NEGATIVE ng/mL (ref <2.5)
Nicotine Metabolite: NEGATIVE ng/mL (ref <5.0)
Norhydrocodone: NEGATIVE ng/mL (ref <2.5)
Noroxycodone: NEGATIVE ng/mL (ref <2.5)
Opiates: POSITIVE ng/mL — AB (ref <2.5)
Oxycodone: 3.8 ng/mL — ABNORMAL HIGH (ref <2.5)
Oxymorphone: NEGATIVE ng/mL (ref <2.5)
Phencyclidine: NEGATIVE ng/mL (ref <10)
Tapentadol: NEGATIVE ng/mL (ref <5.0)
Tramadol: NEGATIVE ng/mL (ref <5.0)
Zolpidem: NEGATIVE ng/mL (ref <5.0)

## 2023-07-19 LAB — DRUG TOX ALC METAB W/CON, ORAL FLD: Alcohol Metabolite: NEGATIVE ng/mL (ref <25)

## 2023-07-24 DIAGNOSIS — Z299 Encounter for prophylactic measures, unspecified: Secondary | ICD-10-CM | POA: Diagnosis not present

## 2023-07-24 DIAGNOSIS — E1165 Type 2 diabetes mellitus with hyperglycemia: Secondary | ICD-10-CM | POA: Diagnosis not present

## 2023-07-24 DIAGNOSIS — I10 Essential (primary) hypertension: Secondary | ICD-10-CM | POA: Diagnosis not present

## 2023-08-03 ENCOUNTER — Other Ambulatory Visit: Payer: Self-pay | Admitting: Nurse Practitioner

## 2023-08-07 ENCOUNTER — Telehealth: Payer: Self-pay | Admitting: Registered Nurse

## 2023-08-07 NOTE — Telephone Encounter (Signed)
 Patient will need refill on pain medication---percocet.

## 2023-08-07 NOTE — Telephone Encounter (Signed)
 PMP was Reviewed.  Virtual visit scheduled for 08/12/2023, last Oxycodone  was filled on 07/15/2023.  Call plaed to ms. Ratterman, no answer and voicemail was full.

## 2023-08-07 NOTE — Telephone Encounter (Signed)
 Pts caregiver shavon called and thought pt had a appt for next week and wanted to reschedule bc of car trouble. I let her know appt was in June and she stated she was wanting the visit to be virtual and for it to be sooner bc pt will be out of meds. Is this okay to schedule?

## 2023-08-11 ENCOUNTER — Other Ambulatory Visit: Payer: Self-pay | Admitting: Registered Nurse

## 2023-08-12 ENCOUNTER — Encounter: Payer: Self-pay | Admitting: Registered Nurse

## 2023-08-12 ENCOUNTER — Encounter: Attending: Registered Nurse | Admitting: Registered Nurse

## 2023-08-12 VITALS — Ht 64.0 in | Wt 240.0 lb

## 2023-08-12 DIAGNOSIS — Z5181 Encounter for therapeutic drug level monitoring: Secondary | ICD-10-CM | POA: Diagnosis not present

## 2023-08-12 DIAGNOSIS — M5412 Radiculopathy, cervical region: Secondary | ICD-10-CM | POA: Insufficient documentation

## 2023-08-12 DIAGNOSIS — M170 Bilateral primary osteoarthritis of knee: Secondary | ICD-10-CM

## 2023-08-12 DIAGNOSIS — G894 Chronic pain syndrome: Secondary | ICD-10-CM | POA: Insufficient documentation

## 2023-08-12 DIAGNOSIS — Z79891 Long term (current) use of opiate analgesic: Secondary | ICD-10-CM | POA: Diagnosis not present

## 2023-08-12 DIAGNOSIS — M7061 Trochanteric bursitis, right hip: Secondary | ICD-10-CM | POA: Diagnosis not present

## 2023-08-12 DIAGNOSIS — M542 Cervicalgia: Secondary | ICD-10-CM | POA: Diagnosis not present

## 2023-08-12 DIAGNOSIS — G8929 Other chronic pain: Secondary | ICD-10-CM | POA: Insufficient documentation

## 2023-08-12 DIAGNOSIS — M546 Pain in thoracic spine: Secondary | ICD-10-CM | POA: Diagnosis not present

## 2023-08-12 DIAGNOSIS — M7062 Trochanteric bursitis, left hip: Secondary | ICD-10-CM | POA: Insufficient documentation

## 2023-08-12 DIAGNOSIS — M1711 Unilateral primary osteoarthritis, right knee: Secondary | ICD-10-CM | POA: Insufficient documentation

## 2023-08-12 DIAGNOSIS — M1712 Unilateral primary osteoarthritis, left knee: Secondary | ICD-10-CM | POA: Insufficient documentation

## 2023-08-12 DIAGNOSIS — M5416 Radiculopathy, lumbar region: Secondary | ICD-10-CM | POA: Insufficient documentation

## 2023-08-12 MED ORDER — OXYCODONE-ACETAMINOPHEN 10-325 MG PO TABS: 1.0000 | ORAL_TABLET | Freq: Four times a day (QID) | ORAL | 0 refills | Status: DC | PRN

## 2023-08-12 NOTE — Progress Notes (Signed)
 Subjective:    Patient ID: [name], female    DOB: February 20, 1951, 72 y.o.   MRN: [id number]  HPI: [name] is a 73 y.o. female who called office reporting car problems, her visit  was changed to a virtual  states, she was unable to access her My-chart. Her visit was changed to telephone visit.   I connected with [name] by telephone and verified that I am speaking with the correct person using two identifiers.  Location: Patient: at her Home  Provider: In the Office    I discussed the limitations, risks, security and privacy concerns of performing an evaluation and management service by telephone and the availability of in person appointments. I also discussed with the patient that there may be a patient responsible charge related to this service. The patient expressed understanding and agreed to proceed.  Her  pain is located in her neck radiating into her bilateral shoulders, mid- lower back radiating into her bilateral hips, bilateral lower extremities and bilateral feet with tingling and burning. She rates her pain 7. Her current exercise regime is walking and performing stretching exercises.  Emily. Leamer Morphine  equivalent is 60,00 MME.   Last Oral Swab was Performed on 07/14/2023, it was consistent.     Pain Inventory Average Pain 7 Pain Right Now 7 My pain is constant, sharp, burning, dull, stabbing, tingling, and aching  In the last 24 hours, has pain interfered with the following? General activity 7 Relation with others 7 Enjoyment of life 7 What TIME of day is your pain at its worst? varies Sleep (in general) Poor  Pain is worse with: walking, bending, sitting, and some activites Pain improves with: medication Relief from Meds: 8  Family History  Problem Relation Age of Onset   Heart attack Mother    Colon cancer Mother    Heart attack Father    Stroke Sister    Hemophilia Child    Cancer Other    Coronary artery disease Other    Cancer  Brother    Cancer Maternal Aunt    Cancer Maternal Uncle    Cancer Paternal Aunt    Cancer Paternal Uncle    Cancer Brother    Social History   Socioeconomic History   Marital status: Divorced    Spouse name: Not on file   Number of children: 4   Years of education: Not on file   Highest education level: Not on file  Occupational History   Occupation: Disabled  Tobacco Use   Smoking status: Never   Smokeless tobacco: Never  Vaping Use   Vaping status: Never Used  Substance and Sexual Activity   Alcohol  use: No    Alcohol /week: 0.0 standard drinks of alcohol    Drug use: No   Sexual activity: Not on file  Other Topics Concern   Not on file  Social History Narrative   Patient does not get regular exercise   Denies caffeine use    Social Drivers of Corporate investment banker Strain: Low Risk  (07/21/2022)   Overall Financial Resource Strain (CARDIA)    Difficulty of Paying Living Expenses: Not very hard  Food Insecurity: No Food Insecurity (07/21/2022)   Hunger Vital Sign    Worried About Running Out of Food in the Last Year: Never true    Ran Out of Food in the Last Year: Never true  Transportation Needs: No Transportation Needs (07/21/2022)   PRAPARE - Transportation  Lack of Transportation (Medical): No    Lack of Transportation (Non-Medical): No  Physical Activity: Inactive (07/21/2022)   Exercise Vital Sign    Days of Exercise per Week: 0 days    Minutes of Exercise per Session: 0 min  Stress: Stress Concern Present (10/18/2018)   Received from [facility], [facility] Occup[facility] - Occupational Stress Questionnaire    Feeling of Stress : To some extent  Social Connections: Moderately Integrated (10/18/2018)   Received from [facility], [facility]   Social Connection and Isolation Panel [NHANES]    Frequency of Communication with Friends and Family: More than three times a week    Frequency of Social Gatherings with  Friends and Family: More than three times a week    Attends Religious Services: More than 4 times per year    Active Member of [employer] or Organizations: No    Attends Banker Meetings: Never    Marital Status: Living with partner   Past Surgical History:  Procedure Laterality Date   ABDOMINAL HYSTERECTOMY     Arm Surgery Bilateral    due to fall-pt broke both forearms   BIOPSY N/A 07/01/2013   Procedure: BIOPSY;  Surgeon: Ruby Corporal, MD;  Location: [facility];  Service: Endoscopy;  Laterality: N/A;   CHOLECYSTECTOMY     COLONOSCOPY  08/06/2011   Procedure: COLONOSCOPY;  Surgeon: Ruby Corporal, MD;  [facility];  Service: Endoscopy;  Laterality: N/A;  215   COLONOSCOPY WITH PROPOFOL  N/A 12/31/2012   Procedure: COLONOSCOPY WITH PROPOFOL ;  Surgeon: Ruby Corporal, MD;  Location: [facility];  Service: Endoscopy;  Laterality: N/A;  in cecum at 0814, total withdrawal time   COLONOSCOPY WITH PROPOFOL  N/A 02/05/2018   Procedure: COLONOSCOPY WITH PROPOFOL ;  Surgeon: Ruby Corporal, MD;  [facility];  Service: Endoscopy;  Laterality: N/A;  1:25   CORONARY ANGIOPLASTY WITH STENT PLACEMENT     CYSTOSCOPY W/ URETERAL STENT PLACEMENT Right 11/10/2019   at [facility]   CYSTOSCOPY WITH RETROGRADE PYELOGRAM, URETEROSCOPY AND STENT PLACEMENT Right 12/22/2019   Procedure: CYSTOSCOPY WITH RIGHT URETERAL STENT REMOVAL; RIGHT RETROGRADE PYELOGRAM,RIGHT URETEROSCOPY;  Surgeon: Marco Severs, MD;  Location: [facility];  Service: Urology;  Laterality: Right;   ESOPHAGEAL DILATION N/A 06/07/2015   Procedure: ESOPHAGEAL DILATION;  Surgeon: Ruby Corporal, MD;  [facility];  Service: Endoscopy;  Laterality: N/A;   ESOPHAGOGASTRODUODENOSCOPY N/A 06/07/2015   Procedure: ESOPHAGOGASTRODUODENOSCOPY (EGD);  Surgeon: Ruby Corporal, MD;  [facility];  Service: Endoscopy;  Laterality: N/A;  2:45 - moved to 1:00 - Ann to notify pt   ESOPHAGOGASTRODUODENOSCOPY  (EGD) WITH PROPOFOL  N/A 12/31/2012   Procedure: ESOPHAGOGASTRODUODENOSCOPY (EGD) WITH PROPOFOL ;  Surgeon: Ruby Corporal, MD;  [facility]: [facility];  Service: Endoscopy;  Laterality: N/A;  GE junction 37,   ESOPHAGOGASTRODUODENOSCOPY (EGD) WITH PROPOFOL  N/A 07/01/2013   Procedure: ESOPHAGOGASTRODUODENOSCOPY (EGD) WITH PROPOFOL ;  Surgeon: Ruby Corporal, MD;  [facility]: [facility];  Service: Endoscopy;  Laterality: N/A;  950   MALONEY DILATION N/A 12/31/2012   Procedure: MALONEY DILATION;  Surgeon: Ruby Corporal, MD;  [facility]: [facility];  Service: Endoscopy;  Laterality: N/A;  used a # 54,#56   MALONEY DILATION N/A 07/01/2013   Procedure: MALONEY DILATION;  Surgeon: Ruby Corporal, MD;  [facility]: [facility];  Service: Endoscopy;  Laterality: N/A;  54/58; no heme present   POLYPECTOMY  02/05/2018   Procedure: POLYPECTOMY;  Surgeon: Ruby Corporal, MD;  [facility];  Service: Endoscopy;;  distal rectum (HS)   STONE EXTRACTION WITH BASKET Right 12/22/2019   Procedure: STONE EXTRACTION WITH BASKET;  Surgeon: Marco Severs, MD;  [facility]: [facility];  Service: Urology;  Laterality: Right;   Past Surgical History:  Procedure Laterality Date   ABDOMINAL HYSTERECTOMY     Arm Surgery Bilateral    due to fall-pt broke both forearms   BIOPSY N/A 07/01/2013   Procedure: BIOPSY;  Surgeon: Ruby Corporal, MD;  [facility];  Service: Endoscopy;  Laterality: N/A;   CHOLECYSTECTOMY     COLONOSCOPY  08/06/2011   Procedure: COLONOSCOPY;  Surgeon: Ruby Corporal, MD;  [facility];  Service: Endoscopy;  Laterality: N/A;  215   COLONOSCOPY WITH PROPOFOL  N/A 12/31/2012   Procedure: COLONOSCOPY WITH PROPOFOL ;  Surgeon: Ruby Corporal, MD;  [facility]: [facility];  Service: Endoscopy;  Laterality: N/A;  in cecum at 0814, total withdrawal time   COLONOSCOPY WITH PROPOFOL  N/A 02/05/2018   Procedure: COLONOSCOPY WITH PROPOFOL ;  Surgeon: Ruby Corporal, MD;  [facility];  Service:  Endoscopy;  Laterality: N/A;  1:25   CORONARY ANGIOPLASTY WITH STENT PLACEMENT     CYSTOSCOPY W/ URETERAL STENT PLACEMENT Right 11/10/2019   at [facility]   CYSTOSCOPY WITH RETROGRADE PYELOGRAM, URETEROSCOPY AND STENT PLACEMENT Right 12/22/2019   Procedure: CYSTOSCOPY WITH RIGHT URETERAL STENT REMOVAL; RIGHT RETROGRADE PYELOGRAM,RIGHT URETEROSCOPY;  Surgeon: Marco Severs, MD;  [facility]: [facility];  Service: Urology;  Laterality: Right;   ESOPHAGEAL DILATION N/A 06/07/2015   Procedure: ESOPHAGEAL DILATION;  Surgeon: Ruby Corporal, MD;  [facility];  Service: Endoscopy;  Laterality: N/A;   ESOPHAGOGASTRODUODENOSCOPY N/A 06/07/2015   Procedure: ESOPHAGOGASTRODUODENOSCOPY (EGD);  Surgeon: Ruby Corporal, MD;  [facility];  Service: Endoscopy;  Laterality: N/A;  2:45 - moved to 1:00 - Ann to notify pt   ESOPHAGOGASTRODUODENOSCOPY (EGD) WITH PROPOFOL  N/A 12/31/2012   Procedure: ESOPHAGOGASTRODUODENOSCOPY (EGD) WITH PROPOFOL ;  Surgeon: Ruby Corporal, MD;  [facility];  Service: Endoscopy;  Laterality: N/A;  GE junction 37,   ESOPHAGOGASTRODUODENOSCOPY (EGD) WITH PROPOFOL  N/A 07/01/2013   Procedure: ESOPHAGOGASTRODUODENOSCOPY (EGD) WITH PROPOFOL ;  Surgeon: Ruby Corporal, MD;  [facility]: [facility];  Service: Endoscopy;  Laterality: N/A;  950   MALONEY DILATION N/A 12/31/2012   Procedure: MALONEY DILATION;  Surgeon: Ruby Corporal, MD;  [facility]: [facility];  Service: Endoscopy;  Laterality: N/A;  used a # 54,#56   MALONEY DILATION N/A 07/01/2013   Procedure: MALONEY DILATION;  Surgeon: Ruby Corporal, MD;  [facility]: [facility];  Service: Endoscopy;  Laterality: N/A;  54/58; no heme present   POLYPECTOMY  02/05/2018   Procedure: POLYPECTOMY;  Surgeon: Ruby Corporal, MD;  [facility];  Service: Endoscopy;;  distal rectum (HS)   STONE EXTRACTION WITH BASKET Right 12/22/2019   Procedure: STONE EXTRACTION WITH BASKET;  Surgeon: Marco Severs, MD;  Location: [facility];   Service: Urology;  Laterality: Right;   Past Medical History:  Diagnosis Date   Anxiety and depression    Arthritis    CAD (coronary artery disease)    Stent placement circumflex coronary 2007, catheterization 2008 patent stents. 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    High cholesterol    Hx of blood clots    Hypertension    MI (myocardial infarction) (HCC)    2007   Obstructive sleep apnea    CPAP, setting ?2   Palpitations    Sleep apnea    Tachycardia    Ht 5\' 4"  (1.626 m)   Wt 240 lb (108.9 kg)   SpO2 92%   BMI 41.20 kg/m   Opioid Risk Score:   Fall Risk Score:  `1  Depression screen [facility] 2/9     08/12/2023    3:07 PM 07/14/2023   12:55 PM 06/12/2023   10:51 AM 05/08/2023    1:20 PM 03/25/2023    2:24 PM 02/26/2023    1:01 PM 01/01/2023    1:51 PM  Depression screen PHQ 2/9  Decreased Interest 3 1 1 1 1 1  0  Down, Depressed, Hopeless 3 1 1 1 1 1  0  PHQ - 2 Score 6 2 2 2 2 2  0  Altered sleeping 3  2      Tired, decreased energy 3  2      Change in appetite 0  1      Feeling bad or failure about yourself  0  0      Trouble concentrating 0  0      Moving slowly or fidgety/restless 0  0      Suicidal thoughts 0  0      PHQ-9 Score 12  7      Difficult doing work/chores Not difficult at all           Review of Systems     Objective:   Physical Exam Vitals and nursing note reviewed.  Musculoskeletal:     Comments: No Physical Exam Performed: Telephone Visit          Assessment & Plan:  1.Lumbar pain lumbar spondylosis:  Encouraged to continue to increase Activity as tolerated.  08/12/2023 Continue current medication regimen. Refilled Oxycodone  10/325 mg one tablet every 6 hours as needed for pain. #120.  We will continue the opioid monitoring program, this consists of regular clinic visits, examinations, urine drug screen, pill counts as well as use of [place]  Controlled Substance Reporting system. A 12 month History has been reviewed on the [place]  Controlled Substance Reporting System on 08/12/2023.  2. Lumbar Radiculitis: Continue current medication regimen with Gabapentin . 08/12/2023 3. Bilateral Knee Pain/ Degenerative: Continue Voltaren  Gel. Continue to Monitor. 08/12/2023 4. Severe diabetic poly neuropathy: Continue current medication regimen with Gabapentin . 05/ 28/2025 5. Insomnia: No complaints today. Continue to Monitor.  08/12/2023 6.Anxiety/Depression: Continue Current Medication regimen as prescribed by PCP. Aaron Aas 08/12/2023 7. De Quervains Tenosynovitis: No Complaints Today. 05/28//2025 8. Bilateral  Greater Trochanteric Bursitis: Continue with Ice and Heat Therapy. Continue to Monitor. 08/12/2023.  9. Cervicalgia/ Cervical Radiculitis:No complaints today. Continue Gabapentin . Continue with HEP as Tolerated and continue to monitor. 08/12/2023 10. Chronic Bilateral Thoracic Back Pain:  Continue HEP as Tolerated. Continue current medication regimen. Continue to monitor. 08/12/2023.   F/U in 1 month   Telephone Visit Establish Patient Location of Patient: In her Home Location of Provider: In the  Office

## 2023-08-14 DIAGNOSIS — R0902 Hypoxemia: Secondary | ICD-10-CM | POA: Diagnosis not present

## 2023-08-14 DIAGNOSIS — G4733 Obstructive sleep apnea (adult) (pediatric): Secondary | ICD-10-CM | POA: Diagnosis not present

## 2023-08-14 DIAGNOSIS — I509 Heart failure, unspecified: Secondary | ICD-10-CM | POA: Diagnosis not present

## 2023-08-14 DIAGNOSIS — M797 Fibromyalgia: Secondary | ICD-10-CM | POA: Diagnosis not present

## 2023-08-14 DIAGNOSIS — M159 Polyosteoarthritis, unspecified: Secondary | ICD-10-CM | POA: Diagnosis not present

## 2023-08-17 DIAGNOSIS — I509 Heart failure, unspecified: Secondary | ICD-10-CM | POA: Diagnosis not present

## 2023-09-10 ENCOUNTER — Encounter: Attending: Registered Nurse | Admitting: Registered Nurse

## 2023-09-10 ENCOUNTER — Encounter: Payer: Self-pay | Admitting: Registered Nurse

## 2023-09-10 VITALS — BP 119/71 | HR 65 | Ht 64.0 in | Wt 252.0 lb

## 2023-09-10 DIAGNOSIS — G8929 Other chronic pain: Secondary | ICD-10-CM | POA: Diagnosis not present

## 2023-09-10 DIAGNOSIS — Z5181 Encounter for therapeutic drug level monitoring: Secondary | ICD-10-CM | POA: Insufficient documentation

## 2023-09-10 DIAGNOSIS — M1711 Unilateral primary osteoarthritis, right knee: Secondary | ICD-10-CM | POA: Insufficient documentation

## 2023-09-10 DIAGNOSIS — Z79891 Long term (current) use of opiate analgesic: Secondary | ICD-10-CM | POA: Diagnosis not present

## 2023-09-10 DIAGNOSIS — M1712 Unilateral primary osteoarthritis, left knee: Secondary | ICD-10-CM | POA: Diagnosis not present

## 2023-09-10 DIAGNOSIS — M5412 Radiculopathy, cervical region: Secondary | ICD-10-CM | POA: Insufficient documentation

## 2023-09-10 DIAGNOSIS — G894 Chronic pain syndrome: Secondary | ICD-10-CM | POA: Insufficient documentation

## 2023-09-10 DIAGNOSIS — M5416 Radiculopathy, lumbar region: Secondary | ICD-10-CM | POA: Insufficient documentation

## 2023-09-10 DIAGNOSIS — M25512 Pain in left shoulder: Secondary | ICD-10-CM | POA: Diagnosis not present

## 2023-09-10 DIAGNOSIS — M25511 Pain in right shoulder: Secondary | ICD-10-CM | POA: Insufficient documentation

## 2023-09-10 DIAGNOSIS — M542 Cervicalgia: Secondary | ICD-10-CM | POA: Diagnosis not present

## 2023-09-10 DIAGNOSIS — M546 Pain in thoracic spine: Secondary | ICD-10-CM | POA: Diagnosis not present

## 2023-09-10 MED ORDER — OXYCODONE-ACETAMINOPHEN 10-325 MG PO TABS: 1.0000 | ORAL_TABLET | Freq: Four times a day (QID) | ORAL | 0 refills | Status: DC | PRN

## 2023-09-10 NOTE — Progress Notes (Signed)
 Subjective:    Patient ID: [name], female    DOB: Apr 20, 1950, 73 y.o.   MRN: [id number]  HPI: [name] is a 73 y.o. female who returns for follow up appointment for chronic pain and medication refill. She states her pain is located in her neck radiating into her bilateral shoulders, upper- lower back radiating into her bilateral lower extremities. Also reports bilateral knee pain and bilateral hands and bilateral feet with tingling and burning. She rates her pain 8. Her current exercise regime is walking and performing stretching exercises.  [name] Morphine  equivalent is 60.00 MME.   Last Oral Swab was Performed on 07/14/2023, it was consistent.      Pain Inventory Average Pain 7 Pain Right Now 8 My pain is intermittent, constant, sharp, burning, dull, stabbing, tingling, and aching  In the last 24 hours, has pain interfered with the following? General activity 5 Relation with others 0 Enjoyment of life 10 What TIME of day is your pain at its worst? morning , daytime, evening, and night Sleep (in general) Fair  Pain is worse with: unsure Pain improves with: rest, heat/ice, and medication Relief from Meds: 10  Family History  Problem Relation Age of Onset   Heart attack Mother    Colon cancer Mother    Heart attack Father    Stroke Sister    Hemophilia Child    Cancer Other    Coronary artery disease Other    Cancer Brother    Cancer Maternal Aunt    Cancer Maternal Uncle    Cancer Paternal Aunt    Cancer Paternal Uncle    Cancer Brother    Social History   Socioeconomic History   Marital status: Divorced    Spouse name: Not on file   Number of children: 4   Years of education: Not on file   Highest education level: Not on file  Occupational History   Occupation: Disabled  Tobacco Use   Smoking status: Never   Smokeless tobacco: Never  Vaping Use   Vaping status: Never Used  Substance and Sexual Activity   Alcohol  use: No    Alcohol /week:  0.0 standard drinks of alcohol    Drug use: No   Sexual activity: Not on file  Other Topics Concern   Not on file  Social History Narrative   Patient does not get regular exercise   Denies caffeine use    Social Drivers of Corporate investment banker Strain: Low Risk  (07/21/2022)   Overall Financial Resource Strain (CARDIA)    Difficulty of Paying Living Expenses: Not very hard  Food Insecurity: No Food Insecurity (07/21/2022)   Hunger Vital Sign    Worried About Running Out of Food in the Last Year: Never true    Ran Out of Food in the Last Year: Never true  Transportation Needs: No Transportation Needs (07/21/2022)   PRAPARE - Administrator, Civil Service (Medical): No    Lack of Transportation (Non-Medical): No  Physical Activity: Inactive (07/21/2022)   Exercise Vital Sign    Days of Exercise per Week: 0 days    Minutes of Exercise per Session: 0 min  Stress: Stress Concern Present (10/18/2018)   Received from [facility] - Occupational Stress Questionnaire    Feeling of Stress : To some extent  Social Connections: Moderately Integrated (10/18/2018)   Received from [facility]   Social Connection and  Isolation Panel    In a typical week, how many times do you talk on the phone with family, friends, or neighbors?: More than three times a week    How often do you get together with friends or relatives?: More than three times a week    How often do you attend church or religious services?: More than 4 times per year    Do you belong to any clubs or organizations such as church groups, unions, fraternal or athletic groups, or school groups?: No    How often do you attend meetings of the clubs or organizations you belong to?: Never    Are you married, widowed, divorced, separated, never married, or living with a partner?: Living with partner   Past Surgical History:  Procedure Laterality Date   ABDOMINAL HYSTERECTOMY     Arm  Surgery Bilateral    due to fall-pt broke both forearms   BIOPSY N/A 07/01/2013   Procedure: BIOPSY;  Surgeon: Claudis RAYMOND Rivet, MD;  [facility]: [facility];  Service: Endoscopy;  Laterality: N/A;   CHOLECYSTECTOMY     COLONOSCOPY  08/06/2011   Procedure: COLONOSCOPY;  Surgeon: Claudis RAYMOND Rivet, MD;  [facility];  Service: Endoscopy;  Laterality: N/A;  215   COLONOSCOPY WITH PROPOFOL  N/A 12/31/2012   Procedure: COLONOSCOPY WITH PROPOFOL ;  Surgeon: Claudis RAYMOND Rivet, MD;  Location: [facility];  Service: Endoscopy;  Laterality: N/A;  in cecum at 0814, total withdrawal time   COLONOSCOPY WITH PROPOFOL  N/A 02/05/2018   Procedure: COLONOSCOPY WITH PROPOFOL ;  Surgeon: Rivet Claudis RAYMOND, MD;  [facility];  Service: Endoscopy;  Laterality: N/A;  1:25   CORONARY ANGIOPLASTY WITH STENT PLACEMENT     CYSTOSCOPY W/ URETERAL STENT PLACEMENT Right 11/10/2019   at [facility]   CYSTOSCOPY WITH RETROGRADE PYELOGRAM, URETEROSCOPY AND STENT PLACEMENT Right 12/22/2019   Procedure: CYSTOSCOPY WITH RIGHT URETERAL STENT REMOVAL; RIGHT RETROGRADE PYELOGRAM,RIGHT URETEROSCOPY;  Surgeon: Sherrilee Belvie CROME, MD;  Location: [facility];  Service: Urology;  Laterality: Right;   ESOPHAGEAL DILATION N/A 06/07/2015   Procedure: ESOPHAGEAL DILATION;  Surgeon: Claudis RAYMOND Rivet, MD;  [facility];  Service: Endoscopy;  Laterality: N/A;   ESOPHAGOGASTRODUODENOSCOPY N/A 06/07/2015   Procedure: ESOPHAGOGASTRODUODENOSCOPY (EGD);  Surgeon: Claudis RAYMOND Rivet, MD;  [facility];  Service: Endoscopy;  Laterality: N/A;  2:45 - moved to 1:00 - Ann to notify pt   ESOPHAGOGASTRODUODENOSCOPY (EGD) WITH PROPOFOL  N/A 12/31/2012   Procedure: ESOPHAGOGASTRODUODENOSCOPY (EGD) WITH PROPOFOL ;  Surgeon: Claudis RAYMOND Rivet, MD;  [facility]: [facility];  Service: Endoscopy;  Laterality: N/A;  GE junction 37,   ESOPHAGOGASTRODUODENOSCOPY (EGD) WITH PROPOFOL  N/A 07/01/2013   Procedure: ESOPHAGOGASTRODUODENOSCOPY (EGD) WITH PROPOFOL ;  Surgeon:  Claudis RAYMOND Rivet, MD;  [facility]: [facility];  Service: Endoscopy;  Laterality: N/A;  950   MALONEY DILATION N/A 12/31/2012   Procedure: MALONEY DILATION;  Surgeon: Claudis RAYMOND Rivet, MD;  [facility]: [facility];  Service: Endoscopy;  Laterality: N/A;  used a # 54,#56   MALONEY DILATION N/A 07/01/2013   Procedure: MALONEY DILATION;  Surgeon: Claudis RAYMOND Rivet, MD;  [facility]: [facility];  Service: Endoscopy;  Laterality: N/A;  54/58; no heme present   POLYPECTOMY  02/05/2018   Procedure: POLYPECTOMY;  Surgeon: Rivet Claudis RAYMOND, MD;  [facility];  Service: Endoscopy;;  distal rectum (HS)   STONE EXTRACTION WITH BASKET Right 12/22/2019   Procedure: STONE EXTRACTION WITH BASKET;  Surgeon: Sherrilee Belvie CROME, MD;  [facility]: [facility];  Service: Urology;  Laterality: Right;   Past Surgical History:  Procedure Laterality Date   ABDOMINAL HYSTERECTOMY     Arm Surgery Bilateral    due to fall-pt broke both forearms   BIOPSY N/A 07/01/2013   Procedure: BIOPSY;  Surgeon: Claudis RAYMOND Rivet, MD;  [facility];  Service: Endoscopy;  Laterality: N/A;   CHOLECYSTECTOMY     COLONOSCOPY  08/06/2011   Procedure: COLONOSCOPY;  Surgeon: Claudis RAYMOND Rivet, MD;  [facility];  Service: Endoscopy;  Laterality: N/A;  215   COLONOSCOPY WITH PROPOFOL  N/A 12/31/2012   Procedure: COLONOSCOPY WITH PROPOFOL ;  Surgeon: Claudis RAYMOND Rivet, MD;  [facility]: [facility];  Service: Endoscopy;  Laterality: N/A;  in cecum at 0814, total withdrawal time   COLONOSCOPY WITH PROPOFOL  N/A 02/05/2018   Procedure: COLONOSCOPY WITH PROPOFOL ;  Surgeon: Rivet Claudis RAYMOND, MD;  [facility];  Service: Endoscopy;  Laterality: N/A;  1:25   CORONARY ANGIOPLASTY WITH STENT PLACEMENT     CYSTOSCOPY W/ URETERAL STENT PLACEMENT Right 11/10/2019   at [facility]   CYSTOSCOPY WITH RETROGRADE PYELOGRAM, URETEROSCOPY AND STENT PLACEMENT Right 12/22/2019   Procedure: CYSTOSCOPY WITH RIGHT URETERAL STENT REMOVAL; RIGHT RETROGRADE PYELOGRAM,RIGHT  URETEROSCOPY;  Surgeon: Sherrilee Belvie CROME, MD;  [facility]: [facility];  Service: Urology;  Laterality: Right;   ESOPHAGEAL DILATION N/A 06/07/2015   Procedure: ESOPHAGEAL DILATION;  Surgeon: Claudis RAYMOND Rivet, MD;  [facility];  Service: Endoscopy;  Laterality: N/A;   ESOPHAGOGASTRODUODENOSCOPY N/A 06/07/2015   Procedure: ESOPHAGOGASTRODUODENOSCOPY (EGD);  Surgeon: Claudis RAYMOND Rivet, MD;  [facility];  Service: Endoscopy;  Laterality: N/A;  2:45 - moved to 1:00 - Ann to notify pt   ESOPHAGOGASTRODUODENOSCOPY (EGD) WITH PROPOFOL  N/A 12/31/2012   Procedure: ESOPHAGOGASTRODUODENOSCOPY (EGD) WITH PROPOFOL ;  Surgeon: Claudis RAYMOND Rivet, MD;  [facility]: [facility];  Service: Endoscopy;  Laterality: N/A;  GE junction 37,   ESOPHAGOGASTRODUODENOSCOPY (EGD) WITH PROPOFOL  N/A 07/01/2013   Procedure: ESOPHAGOGASTRODUODENOSCOPY (EGD) WITH PROPOFOL ;  Surgeon: Claudis RAYMOND Rivet, MD;  [facility]: [facility];  Service: Endoscopy;  Laterality: N/A;  950   MALONEY DILATION N/A 12/31/2012   Procedure: MALONEY DILATION;  Surgeon: Claudis RAYMOND Rivet, MD;  [facility]: [facility];  Service: Endoscopy;  Laterality: N/A;  used a # 54,#56   MALONEY DILATION N/A 07/01/2013   Procedure: MALONEY DILATION;  Surgeon: Claudis RAYMOND Rivet, MD;  [facility]: [facility];  Service: Endoscopy;  Laterality: N/A;  54/58; no heme present   POLYPECTOMY  02/05/2018   Procedure: POLYPECTOMY;  Surgeon: Rivet Claudis RAYMOND, MD;  [facility];  Service: Endoscopy;;  distal rectum (HS)   STONE EXTRACTION WITH BASKET Right 12/22/2019   Procedure: STONE EXTRACTION WITH BASKET;  Surgeon: Sherrilee Belvie CROME, MD;  Location: [facility];  Service: Urology;  Laterality: Right;   Past Medical History:  Diagnosis Date   Anxiety and depression    Arthritis    CAD (coronary artery disease)    Stent placement circumflex coronary 2007, catheterization 2008 patent stents. 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    High cholesterol    Hx of blood clots    Hypertension    MI (myocardial infarction) (HCC)    2007   Obstructive sleep apnea    CPAP, setting ?2   Palpitations    Sleep apnea    Tachycardia    Ht 5' 4 (1.626 m)   Wt 252 lb (114.3 kg)   BMI 43.26 kg/m   Opioid Risk Score:   Fall Risk Score:  `1  Depression screen [facility] 2/9     09/10/2023   12:57 PM 08/12/2023    3:07 PM 07/14/2023   12:55 PM 06/12/2023   10:51 AM 05/08/2023    1:20 PM 03/25/2023    2:24 PM 02/26/2023    1:01 PM  Depression screen PHQ 2/9  Decreased Interest 1 3 1 1 1 1 1   Down, Depressed, Hopeless 1 3 1 1 1 1 1   PHQ - 2 Score 2 6 2 2 2 2 2   Altered sleeping  3  2     Tired, decreased energy  3  2     Change in appetite  0  1     Feeling bad or failure about yourself   0  0     Trouble concentrating  0  0     Moving slowly or fidgety/restless  0  0     Suicidal thoughts  0  0     PHQ-9 Score  12  7     Difficult doing work/chores  Not difficult at all         Review of Systems  Musculoskeletal:  Positive for back pain, gait problem and neck pain.       Pain in: both elbows, both legs, both knees, toes, shoulders, fingers  All other systems reviewed and are negative.      Objective:   Physical Exam Vitals and nursing note reviewed.  Constitutional:      Appearance: Normal appearance. She is obese.   Cardiovascular:     Rate and Rhythm: Normal rate and regular rhythm.     Pulses: Normal pulses.     Heart sounds: Normal heart sounds.  Pulmonary:     Effort: Pulmonary effort is normal.     Breath sounds: Normal breath sounds.   Musculoskeletal:     Comments: Normal Muscle Bulk and  Muscle Testing Reveals:  Upper Extremities: Full ROM and Muscle Strength 5/5 Bilateral AC Joint Tenderness  Thoracic and Lumbar Hypersensitivity Lower Extremities: Full ROM and Muscle Strength 5/5 Arises from Chair slowly using walker for support Antalgic Gait      Skin:    General: Skin is warm and dry.   Neurological:     Mental Status: She is alert and oriented to person, place, and time.   Psychiatric:        Mood and Affect: Mood normal.        Behavior: Behavior normal.          Assessment & Plan:  1.Lumbar pain lumbar spondylosis:  Encouraged to continue to increase Activity as tolerated. 09/10/2023 Continue current medication regimen. Refilled Oxycodone  10/325 mg one tablet every 6 hours as needed for pain. #120.  We will continue the opioid monitoring program, this consists of regular clinic visits, examinations, urine drug screen, pill counts as well as use of [place]  Controlled Substance Reporting system. A 12 month History has been reviewed on the [place]  Controlled Substance Reporting System on 09/10/2023.  2. Lumbar Radiculitis: Continue current medication regimen with Gabapentin . 09/10/2023 3. Bilateral Knee Pain/ Degenerative: Continue Voltaren  Gel. Continue to Monitor. 09/10/2023 4. Severe diabetic poly neuropathy: Continue current medication regimen with Gabapentin . 06/ 26/2025 5. Insomnia: No complaints today. Continue to Monitor.  09/10/2023 6.Anxiety/Depression: Continue Current Medication regimen as prescribed by PCP. SABRA 09/10/2023 7. De Quervains Tenosynovitis: No Complaints Today. 06/26//2025 8. Bilateral  Greater Trochanteric Bursitis: No complaints today. Continue with Ice and Heat Therapy. Continue to Monitor. 09/10/2023.  9. Cervicalgia/ Cervical Radiculitis:No complaints today. Continue Gabapentin . Continue with HEP as Tolerated and continue to monitor. 06/276/2025 10. Chronic Bilateral Thoracic Back Pain:  Continue HEP as Tolerated.  Continue current medication regimen. Continue to monitor. 09/10/2023.   F/U in 1 month

## 2023-09-10 NOTE — Patient Instructions (Signed)
 My- Chart:   [phone]

## 2023-09-14 DIAGNOSIS — M797 Fibromyalgia: Secondary | ICD-10-CM | POA: Diagnosis not present

## 2023-09-14 DIAGNOSIS — R0902 Hypoxemia: Secondary | ICD-10-CM | POA: Diagnosis not present

## 2023-09-14 DIAGNOSIS — G4733 Obstructive sleep apnea (adult) (pediatric): Secondary | ICD-10-CM | POA: Diagnosis not present

## 2023-09-14 DIAGNOSIS — M159 Polyosteoarthritis, unspecified: Secondary | ICD-10-CM | POA: Diagnosis not present

## 2023-09-14 DIAGNOSIS — I509 Heart failure, unspecified: Secondary | ICD-10-CM | POA: Diagnosis not present

## 2023-09-26 DIAGNOSIS — R079 Chest pain, unspecified: Secondary | ICD-10-CM | POA: Diagnosis not present

## 2023-09-26 DIAGNOSIS — R112 Nausea with vomiting, unspecified: Secondary | ICD-10-CM | POA: Diagnosis not present

## 2023-09-26 DIAGNOSIS — K3184 Gastroparesis: Secondary | ICD-10-CM | POA: Diagnosis not present

## 2023-09-26 DIAGNOSIS — A419 Sepsis, unspecified organism: Secondary | ICD-10-CM | POA: Diagnosis not present

## 2023-09-26 DIAGNOSIS — Z79891 Long term (current) use of opiate analgesic: Secondary | ICD-10-CM | POA: Diagnosis not present

## 2023-09-26 DIAGNOSIS — R1110 Vomiting, unspecified: Secondary | ICD-10-CM | POA: Diagnosis not present

## 2023-09-26 DIAGNOSIS — E1143 Type 2 diabetes mellitus with diabetic autonomic (poly)neuropathy: Secondary | ICD-10-CM | POA: Diagnosis not present

## 2023-09-26 DIAGNOSIS — R197 Diarrhea, unspecified: Secondary | ICD-10-CM | POA: Diagnosis not present

## 2023-09-26 DIAGNOSIS — J449 Chronic obstructive pulmonary disease, unspecified: Secondary | ICD-10-CM | POA: Diagnosis not present

## 2023-09-26 DIAGNOSIS — Z79899 Other long term (current) drug therapy: Secondary | ICD-10-CM | POA: Diagnosis not present

## 2023-09-26 DIAGNOSIS — K529 Noninfective gastroenteritis and colitis, unspecified: Secondary | ICD-10-CM | POA: Diagnosis not present

## 2023-09-26 DIAGNOSIS — Z85048 Personal history of other malignant neoplasm of rectum, rectosigmoid junction, and anus: Secondary | ICD-10-CM | POA: Diagnosis not present

## 2023-09-26 DIAGNOSIS — R0902 Hypoxemia: Secondary | ICD-10-CM | POA: Diagnosis not present

## 2023-09-26 DIAGNOSIS — R918 Other nonspecific abnormal finding of lung field: Secondary | ICD-10-CM | POA: Diagnosis not present

## 2023-09-26 DIAGNOSIS — K219 Gastro-esophageal reflux disease without esophagitis: Secondary | ICD-10-CM | POA: Diagnosis not present

## 2023-09-26 DIAGNOSIS — Z794 Long term (current) use of insulin: Secondary | ICD-10-CM | POA: Diagnosis not present

## 2023-09-26 DIAGNOSIS — Z888 Allergy status to other drugs, medicaments and biological substances status: Secondary | ICD-10-CM | POA: Diagnosis not present

## 2023-09-26 DIAGNOSIS — Z66 Do not resuscitate: Secondary | ICD-10-CM | POA: Diagnosis not present

## 2023-09-26 DIAGNOSIS — E1140 Type 2 diabetes mellitus with diabetic neuropathy, unspecified: Secondary | ICD-10-CM | POA: Diagnosis not present

## 2023-09-26 DIAGNOSIS — R9341 Abnormal radiologic findings on diagnostic imaging of renal pelvis, ureter, or bladder: Secondary | ICD-10-CM | POA: Diagnosis not present

## 2023-09-26 DIAGNOSIS — Z9049 Acquired absence of other specified parts of digestive tract: Secondary | ICD-10-CM | POA: Diagnosis not present

## 2023-09-26 DIAGNOSIS — Z792 Long term (current) use of antibiotics: Secondary | ICD-10-CM | POA: Diagnosis not present

## 2023-09-26 DIAGNOSIS — I2510 Atherosclerotic heart disease of native coronary artery without angina pectoris: Secondary | ICD-10-CM | POA: Diagnosis not present

## 2023-09-26 DIAGNOSIS — Z7982 Long term (current) use of aspirin: Secondary | ICD-10-CM | POA: Diagnosis not present

## 2023-09-26 DIAGNOSIS — R6889 Other general symptoms and signs: Secondary | ICD-10-CM | POA: Diagnosis not present

## 2023-09-26 DIAGNOSIS — E1165 Type 2 diabetes mellitus with hyperglycemia: Secondary | ICD-10-CM | POA: Diagnosis not present

## 2023-09-26 DIAGNOSIS — A049 Bacterial intestinal infection, unspecified: Secondary | ICD-10-CM | POA: Diagnosis not present

## 2023-09-26 DIAGNOSIS — Z955 Presence of coronary angioplasty implant and graft: Secondary | ICD-10-CM | POA: Diagnosis not present

## 2023-09-26 DIAGNOSIS — I10 Essential (primary) hypertension: Secondary | ICD-10-CM | POA: Diagnosis not present

## 2023-09-26 DIAGNOSIS — Z7984 Long term (current) use of oral hypoglycemic drugs: Secondary | ICD-10-CM | POA: Diagnosis not present

## 2023-09-26 DIAGNOSIS — G8929 Other chronic pain: Secondary | ICD-10-CM | POA: Diagnosis not present

## 2023-09-26 DIAGNOSIS — R0989 Other specified symptoms and signs involving the circulatory and respiratory systems: Secondary | ICD-10-CM | POA: Diagnosis not present

## 2023-09-26 DIAGNOSIS — E1142 Type 2 diabetes mellitus with diabetic polyneuropathy: Secondary | ICD-10-CM | POA: Diagnosis not present

## 2023-10-06 DIAGNOSIS — E1165 Type 2 diabetes mellitus with hyperglycemia: Secondary | ICD-10-CM | POA: Diagnosis not present

## 2023-10-06 DIAGNOSIS — K529 Noninfective gastroenteritis and colitis, unspecified: Secondary | ICD-10-CM | POA: Diagnosis not present

## 2023-10-06 DIAGNOSIS — J449 Chronic obstructive pulmonary disease, unspecified: Secondary | ICD-10-CM | POA: Diagnosis not present

## 2023-10-06 DIAGNOSIS — R52 Pain, unspecified: Secondary | ICD-10-CM | POA: Diagnosis not present

## 2023-10-06 DIAGNOSIS — I10 Essential (primary) hypertension: Secondary | ICD-10-CM | POA: Diagnosis not present

## 2023-10-06 DIAGNOSIS — Z299 Encounter for prophylactic measures, unspecified: Secondary | ICD-10-CM | POA: Diagnosis not present

## 2023-10-06 DIAGNOSIS — Z09 Encounter for follow-up examination after completed treatment for conditions other than malignant neoplasm: Secondary | ICD-10-CM | POA: Diagnosis not present

## 2023-10-07 ENCOUNTER — Encounter: Attending: Registered Nurse | Admitting: Registered Nurse

## 2023-10-07 ENCOUNTER — Encounter: Payer: Self-pay | Admitting: Registered Nurse

## 2023-10-07 VITALS — BP 127/73 | HR 71 | Temp 98.4°F | Ht 64.0 in | Wt 250.4 lb

## 2023-10-07 DIAGNOSIS — M25512 Pain in left shoulder: Secondary | ICD-10-CM | POA: Insufficient documentation

## 2023-10-07 DIAGNOSIS — M1711 Unilateral primary osteoarthritis, right knee: Secondary | ICD-10-CM | POA: Diagnosis not present

## 2023-10-07 DIAGNOSIS — R197 Diarrhea, unspecified: Secondary | ICD-10-CM | POA: Diagnosis not present

## 2023-10-07 DIAGNOSIS — Z5181 Encounter for therapeutic drug level monitoring: Secondary | ICD-10-CM | POA: Insufficient documentation

## 2023-10-07 DIAGNOSIS — M7061 Trochanteric bursitis, right hip: Secondary | ICD-10-CM | POA: Diagnosis not present

## 2023-10-07 DIAGNOSIS — M7062 Trochanteric bursitis, left hip: Secondary | ICD-10-CM | POA: Diagnosis not present

## 2023-10-07 DIAGNOSIS — G8929 Other chronic pain: Secondary | ICD-10-CM | POA: Diagnosis not present

## 2023-10-07 DIAGNOSIS — M5416 Radiculopathy, lumbar region: Secondary | ICD-10-CM | POA: Insufficient documentation

## 2023-10-07 DIAGNOSIS — M5412 Radiculopathy, cervical region: Secondary | ICD-10-CM | POA: Insufficient documentation

## 2023-10-07 DIAGNOSIS — M1712 Unilateral primary osteoarthritis, left knee: Secondary | ICD-10-CM | POA: Diagnosis not present

## 2023-10-07 DIAGNOSIS — M25511 Pain in right shoulder: Secondary | ICD-10-CM | POA: Diagnosis not present

## 2023-10-07 DIAGNOSIS — M546 Pain in thoracic spine: Secondary | ICD-10-CM | POA: Insufficient documentation

## 2023-10-07 DIAGNOSIS — Z79891 Long term (current) use of opiate analgesic: Secondary | ICD-10-CM | POA: Insufficient documentation

## 2023-10-07 DIAGNOSIS — M542 Cervicalgia: Secondary | ICD-10-CM | POA: Diagnosis not present

## 2023-10-07 DIAGNOSIS — G894 Chronic pain syndrome: Secondary | ICD-10-CM | POA: Insufficient documentation

## 2023-10-07 DIAGNOSIS — R1110 Vomiting, unspecified: Secondary | ICD-10-CM | POA: Diagnosis not present

## 2023-10-07 MED ORDER — OXYCODONE-ACETAMINOPHEN 10-325 MG PO TABS: 1.0000 | ORAL_TABLET | Freq: Four times a day (QID) | ORAL | 0 refills | Status: DC | PRN

## 2023-10-07 NOTE — Progress Notes (Unsigned)
 Subjective:    Patient ID: [name], female    DOB: September 15, 1950, 73 y.o.   MRN: [id number]  HPI: [name] is a 73 y.o. female who returns for follow up appointment for chronic pain and medication refill. She states her  pain is located in her neck radiating into her bilateral shoulders, upper- lower back radiating into her bilateral hips and bilateral lower extremities. Also reports bilateral feet with tingling and burning. She rates her pain 5. Her current exercise regime is walking with her walker  and performing stretching exercises.  Ms. Ergle Morphine  equivalent is 60.00 MME.   Oral Swab was Performed today.   Ms. Ausyin was admitted to [facility]  on 09/26/2023 and discharged on o7/14/2025. Discharged summary was reviewed.    Pain Inventory Average Pain 5 Pain Right Now 5 My pain is sharp, burning, dull, stabbing, tingling, and aching  In the last 24 hours, has pain interfered with the following? General activity 4 Relation with others 5 Enjoyment of life 5 What TIME of day is your pain at its worst? morning  and night Sleep (in general) Fair  Pain is worse with: walking, bending, sitting, inactivity, standing, and some activites Pain improves with: medication Relief from Meds: 8  Family History  Problem Relation Age of Onset   Heart attack Mother    Colon cancer Mother    Heart attack Father    Stroke Sister    Hemophilia Child    Cancer Other    Coronary artery disease Other    Cancer Brother    Cancer Maternal Aunt    Cancer Maternal Uncle    Cancer Paternal Aunt    Cancer Paternal Uncle    Cancer Brother    Social History   Socioeconomic History   Marital status: Divorced    Spouse name: Not on file   Number of children: 4   Years of education: Not on file   Highest education level: Not on file  Occupational History   Occupation: Disabled  Tobacco Use   Smoking status: Never   Smokeless tobacco: Never  Vaping Use   Vaping status: Never Used   Substance and Sexual Activity   Alcohol  use: No    Alcohol /week: 0.0 standard drinks of alcohol    Drug use: No   Sexual activity: Not on file  Other Topics Concern   Not on file  Social History Narrative   Patient does not get regular exercise   Denies caffeine use    Social Drivers of Corporate investment banker Strain: Low Risk  (09/26/2023)   Received from [facility]   Overall Financial Resource Strain (CARDIA)    How hard is it for you to pay for the very basics like food, housing, medical care, and heating?: Not hard at all  Food Insecurity: No Food Insecurity (09/26/2023)   Received from [facility]   Hunger Vital Sign    Within the past 12 months, you worried that your food would run out before you got the money to buy more.: Never true    Within the past 12 months, the food you bought just didn't last and you didn't have money to get more.: Never true  Transportation Needs: No Transportation Needs (09/26/2023)   Received from [facility]   PRAPARE - Transportation    Lack of Transportation (Medical): No    Lack of Transportation (Non-Medical): No  Physical Activity: Insufficiently Active (09/26/2023)   Received from  [facility]   Exercise Vital Sign    On average, how many days per week do you engage in moderate to strenuous exercise (like a brisk walk)?: 7 days    On average, how many minutes do you engage in exercise at this level?: 10 min  Stress: No Stress Concern Present (09/26/2023)   Received from [facility] - Occupational Stress Questionnaire    Do you feel stress - tense, restless, nervous, or anxious, or unable to sleep at night because your mind is troubled all the time - these days?: Not at all  Social Connections: Moderately Integrated (09/26/2023)   Received from [facility]   Social Connection and Isolation Panel    In a typical week, how many times do you talk on the phone with family,  friends, or neighbors?: More than three times a week    How often do you get together with friends or relatives?: More than three times a week    How often do you attend church or religious services?: More than 4 times per year    Do you belong to any clubs or organizations such as church groups, unions, fraternal or athletic groups, or school groups?: No    How often do you attend meetings of the clubs or organizations you belong to?: Never    Are you married, widowed, divorced, separated, never married, or living with a partner?: Living with partner   Past Surgical History:  Procedure Laterality Date   ABDOMINAL HYSTERECTOMY     Arm Surgery Bilateral    due to fall-pt broke both forearms   BIOPSY N/A 07/01/2013   Procedure: BIOPSY;  Surgeon: Claudis RAYMOND Rivet, MD;  [facility]: [facility];  Service: Endoscopy;  Laterality: N/A;   CHOLECYSTECTOMY     COLONOSCOPY  08/06/2011   Procedure: COLONOSCOPY;  Surgeon: Claudis RAYMOND Rivet, MD;  [facility];  Service: Endoscopy;  Laterality: N/A;  215   COLONOSCOPY WITH PROPOFOL  N/A 12/31/2012   Procedure: COLONOSCOPY WITH PROPOFOL ;  Surgeon: Claudis RAYMOND Rivet, MD;  Location: [facility];  Service: Endoscopy;  Laterality: N/A;  in cecum at 0814, total withdrawal time   COLONOSCOPY WITH PROPOFOL  N/A 02/05/2018   Procedure: COLONOSCOPY WITH PROPOFOL ;  Surgeon: Rivet Claudis RAYMOND, MD;  [facility];  Service: Endoscopy;  Laterality: N/A;  1:25   CORONARY ANGIOPLASTY WITH STENT PLACEMENT     CYSTOSCOPY W/ URETERAL STENT PLACEMENT Right 11/10/2019   at [facility]   CYSTOSCOPY WITH RETROGRADE PYELOGRAM, URETEROSCOPY AND STENT PLACEMENT Right 12/22/2019   Procedure: CYSTOSCOPY WITH RIGHT URETERAL STENT REMOVAL; RIGHT RETROGRADE PYELOGRAM,RIGHT URETEROSCOPY;  Surgeon: Sherrilee Belvie CROME, MD;  Location: [facility];  Service: Urology;  Laterality: Right;   ESOPHAGEAL DILATION N/A 06/07/2015   Procedure: ESOPHAGEAL DILATION;  Surgeon: Claudis RAYMOND Rivet, MD;  [facility];  Service: Endoscopy;  Laterality: N/A;   ESOPHAGOGASTRODUODENOSCOPY N/A 06/07/2015   Procedure: ESOPHAGOGASTRODUODENOSCOPY (EGD);  Surgeon: Claudis RAYMOND Rivet, MD;  [facility];  Service: Endoscopy;  Laterality: N/A;  2:45 - moved to 1:00 - Ann to notify pt   ESOPHAGOGASTRODUODENOSCOPY (EGD) WITH PROPOFOL  N/A 12/31/2012   Procedure: ESOPHAGOGASTRODUODENOSCOPY (EGD) WITH PROPOFOL ;  Surgeon: Claudis RAYMOND Rivet, MD;  [facility];  Service: Endoscopy;  Laterality: N/A;  GE junction 37,   ESOPHAGOGASTRODUODENOSCOPY (EGD) WITH PROPOFOL  N/A 07/01/2013   Procedure: ESOPHAGOGASTRODUODENOSCOPY (EGD) WITH PROPOFOL ;  Surgeon: Claudis RAYMOND Rivet, MD;  Location: [facility];  Service: Endoscopy;  Laterality: N/A;  950   MALONEY DILATION N/A 12/31/2012   Procedure: MALONEY DILATION;  Surgeon: Claudis RAYMOND Rivet, MD;  [facility];  Service: Endoscopy;  Laterality: N/A;  used a # 54,#56   MALONEY DILATION N/A 07/01/2013   Procedure: MALONEY DILATION;  Surgeon: Claudis RAYMOND Rivet, MD;  [facility];  Service: Endoscopy;  Laterality: N/A;  54/58; no heme present   POLYPECTOMY  02/05/2018   Procedure: POLYPECTOMY;  Surgeon: Rivet Claudis RAYMOND, MD;  [facility];  Service: Endoscopy;;  distal rectum (HS)   STONE EXTRACTION WITH BASKET Right 12/22/2019   Procedure: STONE EXTRACTION WITH BASKET;  Surgeon: Sherrilee Belvie CROME, MD;  [facility]: [facility];  Service: Urology;  Laterality: Right;   Past Surgical History:  Procedure Laterality Date   ABDOMINAL HYSTERECTOMY     Arm Surgery Bilateral    due to fall-pt broke both forearms   BIOPSY N/A 07/01/2013   Procedure: BIOPSY;  Surgeon: Claudis RAYMOND Rivet, MD;  [facility];  Service: Endoscopy;  Laterality: N/A;   CHOLECYSTECTOMY     COLONOSCOPY  08/06/2011   Procedure: COLONOSCOPY;  Surgeon: Claudis RAYMOND Rivet, MD;  [facility];  Service: Endoscopy;  Laterality: N/A;  215   COLONOSCOPY WITH PROPOFOL  N/A 12/31/2012   Procedure:  COLONOSCOPY WITH PROPOFOL ;  Surgeon: Claudis RAYMOND Rivet, MD;  [facility]: [facility];  Service: Endoscopy;  Laterality: N/A;  in cecum at 0814, total withdrawal time   COLONOSCOPY WITH PROPOFOL  N/A 02/05/2018   Procedure: COLONOSCOPY WITH PROPOFOL ;  Surgeon: Rivet Claudis RAYMOND, MD;  [facility];  Service: Endoscopy;  Laterality: N/A;  1:25   CORONARY ANGIOPLASTY WITH STENT PLACEMENT     CYSTOSCOPY W/ URETERAL STENT PLACEMENT Right 11/10/2019   at [facility]   CYSTOSCOPY WITH RETROGRADE PYELOGRAM, URETEROSCOPY AND STENT PLACEMENT Right 12/22/2019   Procedure: CYSTOSCOPY WITH RIGHT URETERAL STENT REMOVAL; RIGHT RETROGRADE PYELOGRAM,RIGHT URETEROSCOPY;  Surgeon: Sherrilee Belvie CROME, MD;  Location: [facility];  Service: Urology;  Laterality: Right;   ESOPHAGEAL DILATION N/A 06/07/2015   Procedure: ESOPHAGEAL DILATION;  Surgeon: Claudis RAYMOND Rivet, MD;  [facility];  Service: Endoscopy;  Laterality: N/A;   ESOPHAGOGASTRODUODENOSCOPY N/A 06/07/2015   Procedure: ESOPHAGOGASTRODUODENOSCOPY (EGD);  Surgeon: Claudis RAYMOND Rivet, MD;  [facility];  Service: Endoscopy;  Laterality: N/A;  2:45 - moved to 1:00 - Ann to notify pt   ESOPHAGOGASTRODUODENOSCOPY (EGD) WITH PROPOFOL  N/A 12/31/2012   Procedure: ESOPHAGOGASTRODUODENOSCOPY (EGD) WITH PROPOFOL ;  Surgeon: Claudis RAYMOND Rivet, MD;  [facility];  Service: Endoscopy;  Laterality: N/A;  GE junction 37,   ESOPHAGOGASTRODUODENOSCOPY (EGD) WITH PROPOFOL  N/A 07/01/2013   Procedure: ESOPHAGOGASTRODUODENOSCOPY (EGD) WITH PROPOFOL ;  Surgeon: Claudis RAYMOND Rivet, MD;  [facility]: [facility];  Service: Endoscopy;  Laterality: N/A;  950   MALONEY DILATION N/A 12/31/2012   Procedure: MALONEY DILATION;  Surgeon: Claudis RAYMOND Rivet, MD;  [facility]: [facility];  Service: Endoscopy;  Laterality: N/A;  used a # 54,#56   MALONEY DILATION N/A 07/01/2013   Procedure: MALONEY DILATION;  Surgeon: Claudis RAYMOND Rivet, MD;  [facility]: [facility];  Service: Endoscopy;  Laterality: N/A;  54/58; no  heme present   POLYPECTOMY  02/05/2018   Procedure: POLYPECTOMY;  Surgeon: Rivet Claudis RAYMOND, MD;  [facility];  Service: Endoscopy;;  distal rectum (HS)   STONE EXTRACTION WITH BASKET Right 12/22/2019   Procedure: STONE EXTRACTION WITH BASKET;  Surgeon: Sherrilee Belvie CROME, MD;  Location: [facility];  Service: Urology;  Laterality: Right;   Past Medical History:  Diagnosis Date   Anxiety and depression    Arthritis    CAD (coronary artery disease)    Stent placement circumflex coronary 2007, catheterization 2008 patent stents. 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    High cholesterol    Hx of blood clots    Hypertension    MI (myocardial infarction) (HCC)    2007   Obstructive sleep apnea    CPAP, setting ?2   Palpitations    Sleep apnea    Tachycardia    BP 127/73 (BP Location: Left Arm, Patient Position: Sitting, Cuff Size: Large)   Pulse 71   Temp 98.4 F (36.9 C) (Oral)   Ht 5' 4 (1.626 m)   Wt 250 lb 6.4 oz (113.6 kg)   SpO2 94%   BMI 42.98 kg/m   Opioid Risk Score:   Fall Risk Score:  `1  Depression screen [facility] 2/9     10/07/2023    1:29 PM 09/10/2023   12:57 PM 08/12/2023    3:07 PM 07/14/2023   12:55 PM 06/12/2023   10:51 AM 05/08/2023    1:20 PM 03/25/2023    2:24 PM  Depression screen PHQ 2/9  Decreased Interest 3 1 3 1 1 1 1   Down, Depressed, Hopeless 3 1 3 1 1 1 1   PHQ - 2 Score 6 2 6 2 2 2 2   Altered sleeping 3  3  2     Tired, decreased energy 3  3  2     Change in appetite 2  0  1    Feeling bad or failure about yourself  2  0  0    Trouble concentrating 2  0  0    Moving slowly or  fidgety/restless 0  0  0    Suicidal thoughts 0  0  0    PHQ-9 Score 18  12  7     Difficult doing work/chores Extremely dIfficult  Not difficult at all          Review of Systems  Constitutional:        Patient states she was just in the hospital for pneumonia and is not feeling well today  Respiratory:  Positive for cough.        Recent Pneumonia (discharged from hospital on Monday)  Musculoskeletal:  Positive for back pain, myalgias and neck pain.       Patient noted pain is from top of head to tip of toes (bilateral arm and leg pain)  All other systems reviewed and are negative.      Objective:   Physical Exam Vitals and nursing note reviewed.  Constitutional:      Appearance: Normal appearance. She is obese.  Cardiovascular:     Rate and Rhythm: Normal rate and regular rhythm.     Pulses: Normal pulses.     Heart sounds: Normal heart sounds.  Pulmonary:     Effort: Pulmonary effort is normal.  Breath sounds: Normal breath sounds.  Musculoskeletal:     Right lower leg: Edema present.     Left lower leg: Edema present.     Comments: Normal Muscle Bulk and Muscle Testing Reveals:  Upper Extremities: Full ROM and Muscle Strength 5/5  Thoracic and Lumbar Hypersensitivity Bilateral Greater Trochanter Tenderness Lower Extremities: Full ROM and Muscle Strength 5/5 Arises from Chair slowly using walker for support Antalgic   Gait     Skin:    General: Skin is warm and dry.  Neurological:     Mental Status: She is alert and oriented to person, place, and time.  Psychiatric:        Mood and Affect: Mood normal.        Behavior: Behavior normal.          Assessment & Plan:  1.Lumbar pain lumbar spondylosis:  Encouraged to continue to increase Activity as tolerated. 10/07/2023 Continue current medication regimen. Refilled Oxycodone  10/325 mg one tablet every 6 hours as needed for pain. #120.  We will continue the opioid monitoring program, this consists of regular  clinic visits, examinations, urine drug screen, pill counts as well as use of [place]  Controlled Substance Reporting system. A 12 month History has been reviewed on the [place]  Controlled Substance Reporting System on 10/07/2023.  2. Lumbar Radiculitis: Continue current medication regimen with Gabapentin . 06723/2025 3. Bilateral Knee Pain/ Degenerative: Continue Voltaren  Gel. Continue to Monitor. 10/07/2023 4. Severe diabetic poly neuropathy: Continue current medication regimen with Gabapentin . 07/ 23/2025 5. Insomnia: No complaints today. Continue to Monitor.  10/07/2023 6.Anxiety/Depression: Continue Current Medication regimen as prescribed by PCP. SABRA 10/07/2023 7. De Quervains Tenosynovitis: No Complaints Today. 07/23//2025 8. Bilateral  Greater Trochanteric Bursitis: Continue with Ice and Heat Therapy. Continue to Monitor. 10/07/2023.  9. Cervicalgia/ Cervical Radiculitis:No complaints today. Continue Gabapentin . Continue with HEP as Tolerated and continue to monitor. 10/07/2023 10. Chronic Bilateral Thoracic Back Pain:  Continue HEP as Tolerated. Continue current medication regimen. Continue to monitor. 10/07/2023.   F/U in 1 month

## 2023-10-11 LAB — DRUG TOX MONITOR 1 W/CONF, ORAL FLD
Alprazolam: 0.8 ng/mL — ABNORMAL HIGH (ref <0.50)
Aminoclonazepam: NEGATIVE ng/mL (ref <0.50)
Amphetamines: NEGATIVE ng/mL (ref <10)
Barbiturates: NEGATIVE ng/mL (ref <10)
Benzodiazepines: POSITIVE ng/mL — AB (ref <0.50)
Buprenorphine: NEGATIVE ng/mL (ref <0.10)
Chlordiazepoxide: NEGATIVE ng/mL (ref <0.50)
Clonazepam: NEGATIVE ng/mL (ref <0.50)
Cocaine: NEGATIVE ng/mL (ref <5.0)
Codeine: NEGATIVE ng/mL (ref <2.5)
Diazepam: NEGATIVE ng/mL (ref <0.50)
Dihydrocodeine: NEGATIVE ng/mL (ref <2.5)
Fentanyl: NEGATIVE ng/mL (ref <0.10)
Flunitrazepam: NEGATIVE ng/mL (ref <0.50)
Flurazepam: NEGATIVE ng/mL (ref <0.50)
Heroin Metabolite: NEGATIVE ng/mL (ref <1.0)
Hydrocodone: NEGATIVE ng/mL (ref <2.5)
Hydromorphone: NEGATIVE ng/mL (ref <2.5)
Lorazepam: NEGATIVE ng/mL (ref <0.50)
MARIJUANA: NEGATIVE ng/mL (ref <2.5)
MDMA: NEGATIVE ng/mL (ref <10)
Meprobamate: NEGATIVE ng/mL (ref <2.5)
Methadone: NEGATIVE ng/mL (ref <5.0)
Midazolam: NEGATIVE ng/mL (ref <0.50)
Morphine: NEGATIVE ng/mL (ref <2.5)
Nicotine Metabolite: NEGATIVE ng/mL (ref <5.0)
Nordiazepam: 0.64 ng/mL — ABNORMAL HIGH (ref <0.50)
Norhydrocodone: NEGATIVE ng/mL (ref <2.5)
Noroxycodone: 19.7 ng/mL — ABNORMAL HIGH (ref <2.5)
Opiates: POSITIVE ng/mL — AB (ref <2.5)
Oxazepam: NEGATIVE ng/mL (ref <0.50)
Oxycodone: 40.3 ng/mL — ABNORMAL HIGH (ref <2.5)
Oxymorphone: NEGATIVE ng/mL (ref <2.5)
Phencyclidine: NEGATIVE ng/mL (ref <10)
Tapentadol: NEGATIVE ng/mL (ref <5.0)
Temazepam: NEGATIVE ng/mL (ref <0.50)
Tramadol: NEGATIVE ng/mL (ref <5.0)
Triazolam: NEGATIVE ng/mL (ref <0.50)
Zolpidem: NEGATIVE ng/mL (ref <5.0)

## 2023-10-11 LAB — DRUG TOX ALC METAB W/CON, ORAL FLD: Alcohol Metabolite: NEGATIVE ng/mL (ref <25)

## 2023-10-14 DIAGNOSIS — R0902 Hypoxemia: Secondary | ICD-10-CM | POA: Diagnosis not present

## 2023-10-14 DIAGNOSIS — M159 Polyosteoarthritis, unspecified: Secondary | ICD-10-CM | POA: Diagnosis not present

## 2023-10-14 DIAGNOSIS — I509 Heart failure, unspecified: Secondary | ICD-10-CM | POA: Diagnosis not present

## 2023-10-14 DIAGNOSIS — M797 Fibromyalgia: Secondary | ICD-10-CM | POA: Diagnosis not present

## 2023-10-14 DIAGNOSIS — G4733 Obstructive sleep apnea (adult) (pediatric): Secondary | ICD-10-CM | POA: Diagnosis not present

## 2023-10-23 ENCOUNTER — Telehealth: Payer: Self-pay | Admitting: Registered Nurse

## 2023-10-23 NOTE — Telephone Encounter (Signed)
 PMP was Reviewed.  + Clonazepam  and Diazepam  Ms. Lubinski was admitted to [facility] 09/26/2023 and discharged on 09/28/2023.  Note was reviewed.  Sybil, I don't see any diazepam  on the note, can you review the note to see if you see the Diazepam . If not can you call Ms. Pazos and ask her about the Diazepam , thank you

## 2023-11-04 ENCOUNTER — Other Ambulatory Visit: Payer: Self-pay

## 2023-11-04 MED ORDER — OXYCODONE-ACETAMINOPHEN 10-325 MG PO TABS: 1.0000 | ORAL_TABLET | Freq: Four times a day (QID) | ORAL | 0 refills | Status: DC | PRN

## 2023-11-04 NOTE — Telephone Encounter (Signed)
 Can you refill in [facility] absence?

## 2023-11-14 DIAGNOSIS — M159 Polyosteoarthritis, unspecified: Secondary | ICD-10-CM | POA: Diagnosis not present

## 2023-11-14 DIAGNOSIS — M797 Fibromyalgia: Secondary | ICD-10-CM | POA: Diagnosis not present

## 2023-11-14 DIAGNOSIS — G4733 Obstructive sleep apnea (adult) (pediatric): Secondary | ICD-10-CM | POA: Diagnosis not present

## 2023-11-14 DIAGNOSIS — R0902 Hypoxemia: Secondary | ICD-10-CM | POA: Diagnosis not present

## 2023-11-14 DIAGNOSIS — I509 Heart failure, unspecified: Secondary | ICD-10-CM | POA: Diagnosis not present

## 2023-11-26 ENCOUNTER — Other Ambulatory Visit: Payer: Self-pay | Admitting: Internal Medicine

## 2023-11-26 DIAGNOSIS — Z1231 Encounter for screening mammogram for malignant neoplasm of breast: Secondary | ICD-10-CM

## 2023-12-01 ENCOUNTER — Encounter: Admitting: Registered Nurse

## 2023-12-01 NOTE — Progress Notes (Deleted)
 Subjective:    Patient ID: [name], female    DOB: 1950-07-27, 73 y.o.   MRN: [id number]  HPI  Pain Inventory Average Pain {NUMBERS; 0-10:5044} Pain Right Now {NUMBERS; 0-10:5044} My pain is {PAIN DESCRIPTION:21022940}  In the last 24 hours, has pain interfered with the following? General activity {NUMBERS; 0-10:5044} Relation with others {NUMBERS; 0-10:5044} Enjoyment of life {NUMBERS; 0-10:5044} What TIME of day is your pain at its worst? {time of day:24191} Sleep (in general) {BHH GOOD/FAIR/POOR:22877}  Pain is worse with: {ACTIVITIES:21022942} Pain improves with: {PAIN IMPROVES TPUY:78977056} Relief from Meds: {NUMBERS; 0-10:5044}  Family History  Problem Relation Age of Onset   Heart attack Mother    Colon cancer Mother    Heart attack Father    Stroke Sister    Hemophilia Child    Cancer Other    Coronary artery disease Other    Cancer Brother    Cancer Maternal Aunt    Cancer Maternal Uncle    Cancer Paternal Aunt    Cancer Paternal Uncle    Cancer Brother    Social History   Socioeconomic History   Marital status: Divorced    Spouse name: Not on file   Number of children: 4   Years of education: Not on file   Highest education level: Not on file  Occupational History   Occupation: Disabled  Tobacco Use   Smoking status: Never   Smokeless tobacco: Never  Vaping Use   Vaping status: Never Used  Substance and Sexual Activity   Alcohol  use: No    Alcohol /week: 0.0 standard drinks of alcohol    Drug use: No   Sexual activity: Not on file  Other Topics Concern   Not on file  Social History Narrative   Patient does not get regular exercise   Denies caffeine use    Social Drivers of Corporate investment banker Strain: Low Risk  (09/26/2023)   Received from [facility]   Overall Financial Resource Strain (CARDIA)    How hard is it for you to pay for the very basics like food, housing, medical care, and heating?: Not hard at all  Food  Insecurity: No Food Insecurity (09/26/2023)   Received from [facility]   Hunger Vital Sign    Within the past 12 months, you worried that your food would run out before you got the money to buy more.: Never true    Within the past 12 months, the food you bought just didn't last and you didn't have money to get more.: Never true  Transportation Needs: No Transportation Needs (09/26/2023)   Received from [facility]   PRAPARE - Transportation    Lack of Transportation (Medical): No    Lack of Transportation (Non-Medical): No  Physical Activity: Insufficiently Active (09/26/2023)   Received from [facility]   Exercise Vital Sign    On average, how many days per week do you engage in moderate to strenuous exercise (like a brisk walk)?: 7 days    On average, how many minutes do you engage in exercise at this level?: 10 min  Stress: No Stress Concern Present (09/26/2023)   Received from [facility] - Occupational Stress Questionnaire    Do you feel stress - tense, restless, nervous, or anxious, or unable to sleep at night because your mind is troubled all the time - these days?: Not at all  Social Connections: Moderately Integrated (09/26/2023)  Received from [facility]   Social Connection and Isolation Panel    In a typical week, how many times do you talk on the phone with family, friends, or neighbors?: More than three times a week    How often do you get together with friends or relatives?: More than three times a week    How often do you attend church or religious services?: More than 4 times per year    Do you belong to any clubs or organizations such as church groups, unions, fraternal or athletic groups, or school groups?: No    How often do you attend meetings of the clubs or organizations you belong to?: Never    Are you married, widowed, divorced, separated, never married, or living with a partner?: Living with partner    Past Surgical History:  Procedure Laterality Date   ABDOMINAL HYSTERECTOMY     Arm Surgery Bilateral    due to fall-pt broke both forearms   BIOPSY N/A 07/01/2013   Procedure: BIOPSY;  Surgeon: Claudis RAYMOND Rivet, MD;  [facility]: [facility];  Service: Endoscopy;  Laterality: N/A;   CHOLECYSTECTOMY     COLONOSCOPY  08/06/2011   Procedure: COLONOSCOPY;  Surgeon: Claudis RAYMOND Rivet, MD;  [facility];  Service: Endoscopy;  Laterality: N/A;  215   COLONOSCOPY WITH PROPOFOL  N/A 12/31/2012   Procedure: COLONOSCOPY WITH PROPOFOL ;  Surgeon: Claudis RAYMOND Rivet, MD;  Location: [facility];  Service: Endoscopy;  Laterality: N/A;  in cecum at 0814, total withdrawal time   COLONOSCOPY WITH PROPOFOL  N/A 02/05/2018   Procedure: COLONOSCOPY WITH PROPOFOL ;  Surgeon: Rivet Claudis RAYMOND, MD;  [facility];  Service: Endoscopy;  Laterality: N/A;  1:25   CORONARY ANGIOPLASTY WITH STENT PLACEMENT     CYSTOSCOPY W/ URETERAL STENT PLACEMENT Right 11/10/2019   at [facility]   CYSTOSCOPY WITH RETROGRADE PYELOGRAM, URETEROSCOPY AND STENT PLACEMENT Right 12/22/2019   Procedure: CYSTOSCOPY WITH RIGHT URETERAL STENT REMOVAL; RIGHT RETROGRADE PYELOGRAM,RIGHT URETEROSCOPY;  Surgeon: Sherrilee Belvie CROME, MD;  Location: [facility];  Service: Urology;  Laterality: Right;   ESOPHAGEAL DILATION N/A 06/07/2015   Procedure: ESOPHAGEAL DILATION;  Surgeon: Claudis RAYMOND Rivet, MD;  [facility];  Service: Endoscopy;  Laterality: N/A;   ESOPHAGOGASTRODUODENOSCOPY N/A 06/07/2015   Procedure: ESOPHAGOGASTRODUODENOSCOPY (EGD);  Surgeon: Claudis RAYMOND Rivet, MD;  [facility];  Service: Endoscopy;  Laterality: N/A;  2:45 - moved to 1:00 - Ann to notify pt   ESOPHAGOGASTRODUODENOSCOPY (EGD) WITH PROPOFOL  N/A 12/31/2012   Procedure: ESOPHAGOGASTRODUODENOSCOPY (EGD) WITH PROPOFOL ;  Surgeon: Claudis RAYMOND Rivet, MD;  [facility]: [facility];  Service: Endoscopy;  Laterality: N/A;  GE junction 37,   ESOPHAGOGASTRODUODENOSCOPY (EGD) WITH  PROPOFOL  N/A 07/01/2013   Procedure: ESOPHAGOGASTRODUODENOSCOPY (EGD) WITH PROPOFOL ;  Surgeon: Claudis RAYMOND Rivet, MD;  [facility]: [facility];  Service: Endoscopy;  Laterality: N/A;  950   MALONEY DILATION N/A 12/31/2012   Procedure: MALONEY DILATION;  Surgeon: Claudis RAYMOND Rivet, MD;  [facility]: [facility];  Service: Endoscopy;  Laterality: N/A;  used a # 54,#56   MALONEY DILATION N/A 07/01/2013   Procedure: MALONEY DILATION;  Surgeon: Claudis RAYMOND Rivet, MD;  [facility]: [facility];  Service: Endoscopy;  Laterality: N/A;  54/58; no heme present   POLYPECTOMY  02/05/2018   Procedure: POLYPECTOMY;  Surgeon: Rivet Claudis RAYMOND, MD;  [facility];  Service: Endoscopy;;  distal rectum (HS)   STONE EXTRACTION WITH BASKET Right 12/22/2019   Procedure: STONE EXTRACTION WITH BASKET;  Surgeon: Sherrilee Belvie CROME, MD;  [facility]: [facility];  Service: Urology;  Laterality: Right;   Past Surgical History:  Procedure Laterality Date   ABDOMINAL HYSTERECTOMY     Arm Surgery Bilateral    due to fall-pt broke both forearms   BIOPSY N/A 07/01/2013   Procedure: BIOPSY;  Surgeon: Claudis RAYMOND Rivet, MD;  [facility]: [facility];  Service: Endoscopy;  Laterality: N/A;   CHOLECYSTECTOMY     COLONOSCOPY  08/06/2011   Procedure: COLONOSCOPY;  Surgeon: Claudis RAYMOND Rivet, MD;  [facility];  Service: Endoscopy;  Laterality: N/A;  215   COLONOSCOPY WITH PROPOFOL  N/A 12/31/2012   Procedure: COLONOSCOPY WITH PROPOFOL ;  Surgeon: Claudis RAYMOND Rivet, MD;  [facility]: [facility];  Service: Endoscopy;  Laterality: N/A;  in cecum at 0814, total withdrawal time   COLONOSCOPY WITH PROPOFOL  N/A 02/05/2018   Procedure: COLONOSCOPY WITH PROPOFOL ;  Surgeon: Rivet Claudis RAYMOND, MD;  [facility];  Service: Endoscopy;  Laterality: N/A;  1:25   CORONARY ANGIOPLASTY WITH STENT PLACEMENT     CYSTOSCOPY W/ URETERAL STENT PLACEMENT Right 11/10/2019   at [facility]   CYSTOSCOPY WITH RETROGRADE PYELOGRAM, URETEROSCOPY AND STENT PLACEMENT Right 12/22/2019    Procedure: CYSTOSCOPY WITH RIGHT URETERAL STENT REMOVAL; RIGHT RETROGRADE PYELOGRAM,RIGHT URETEROSCOPY;  Surgeon: Sherrilee Belvie CROME, MD;  [facility]: [facility];  Service: Urology;  Laterality: Right;   ESOPHAGEAL DILATION N/A 06/07/2015   Procedure: ESOPHAGEAL DILATION;  Surgeon: Claudis RAYMOND Rivet, MD;  [facility];  Service: Endoscopy;  Laterality: N/A;   ESOPHAGOGASTRODUODENOSCOPY N/A 06/07/2015   Procedure: ESOPHAGOGASTRODUODENOSCOPY (EGD);  Surgeon: Claudis RAYMOND Rivet, MD;  [facility];  Service: Endoscopy;  Laterality: N/A;  2:45 - moved to 1:00 - Ann to notify pt   ESOPHAGOGASTRODUODENOSCOPY (EGD) WITH PROPOFOL  N/A 12/31/2012   Procedure: ESOPHAGOGASTRODUODENOSCOPY (EGD) WITH PROPOFOL ;  Surgeon: Claudis RAYMOND Rivet, MD;  [facility];  Service: Endoscopy;  Laterality: N/A;  GE junction 37,   ESOPHAGOGASTRODUODENOSCOPY (EGD) WITH PROPOFOL  N/A 07/01/2013   Procedure: ESOPHAGOGASTRODUODENOSCOPY (EGD) WITH PROPOFOL ;  Surgeon: Claudis RAYMOND Rivet, MD;  [facility]: [facility];  Service: Endoscopy;  Laterality: N/A;  950   MALONEY DILATION N/A 12/31/2012   Procedure: MALONEY DILATION;  Surgeon: Claudis RAYMOND Rivet, MD;  [facility]: [facility];  Service: Endoscopy;  Laterality: N/A;  used a # 54,#56   MALONEY DILATION N/A 07/01/2013   Procedure: MALONEY DILATION;  Surgeon: Claudis RAYMOND Rivet, MD;  [facility]: [facility];  Service: Endoscopy;  Laterality: N/A;  54/58; no heme present   POLYPECTOMY  02/05/2018   Procedure: POLYPECTOMY;  Surgeon: Rivet Claudis RAYMOND, MD;  [facility];  Service: Endoscopy;;  distal rectum (HS)   STONE EXTRACTION WITH BASKET Right 12/22/2019   Procedure: STONE EXTRACTION WITH BASKET;  Surgeon: Sherrilee Belvie CROME, MD;  Location: [facility];  Service: Urology;  Laterality: Right;   Past Medical History:  Diagnosis Date   Anxiety and depression    Arthritis    CAD (coronary artery disease)    Stent placement circumflex coronary 2007, catheterization 2008 patent stents. 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  Opioid Risk Score:   Fall Risk Score:  `1  Depression screen [facility] 2/9     10/07/2023    1:29 PM 09/10/2023   12:57 PM 08/12/2023    3:07 PM 07/14/2023   12:55 PM 06/12/2023   10:51 AM 05/08/2023    1:20 PM 03/25/2023    2:24 PM  Depression screen PHQ 2/9  Decreased Interest 3 1 3 1 1 1 1   Down, Depressed, Hopeless 3 1 3 1 1 1 1   PHQ - 2 Score 6 2 6 2 2 2 2   Altered sleeping 3  3  2     Tired, decreased energy 3  3  2     Change in appetite 2  0  1    Feeling bad or failure about yourself  2  0  0    Trouble concentrating 2  0  0    Moving slowly or fidgety/restless 0  0  0    Suicidal thoughts 0  0  0    PHQ-9 Score 18  12  7     Difficult doing work/chores Extremely dIfficult  Not difficult at all         Review of Systems     Objective:   Physical Exam        Assessment & Plan:

## 2023-12-03 ENCOUNTER — Ambulatory Visit: Admitting: Registered Nurse

## 2023-12-03 DIAGNOSIS — I4891 Unspecified atrial fibrillation: Secondary | ICD-10-CM | POA: Diagnosis not present

## 2023-12-03 DIAGNOSIS — Z299 Encounter for prophylactic measures, unspecified: Secondary | ICD-10-CM | POA: Diagnosis not present

## 2023-12-03 DIAGNOSIS — R52 Pain, unspecified: Secondary | ICD-10-CM | POA: Diagnosis not present

## 2023-12-03 DIAGNOSIS — N390 Urinary tract infection, site not specified: Secondary | ICD-10-CM | POA: Diagnosis not present

## 2023-12-03 DIAGNOSIS — I10 Essential (primary) hypertension: Secondary | ICD-10-CM | POA: Diagnosis not present

## 2023-12-03 DIAGNOSIS — R110 Nausea: Secondary | ICD-10-CM | POA: Diagnosis not present

## 2023-12-03 DIAGNOSIS — R350 Frequency of micturition: Secondary | ICD-10-CM | POA: Diagnosis not present

## 2023-12-03 DIAGNOSIS — E1169 Type 2 diabetes mellitus with other specified complication: Secondary | ICD-10-CM | POA: Diagnosis not present

## 2023-12-03 DIAGNOSIS — J449 Chronic obstructive pulmonary disease, unspecified: Secondary | ICD-10-CM | POA: Diagnosis not present

## 2023-12-08 ENCOUNTER — Ambulatory Visit
Admission: RE | Admit: 2023-12-08 | Discharge: 2023-12-08 | Disposition: A | Discharge status: Home / Self Care | Source: Ambulatory Visit | Attending: Internal Medicine | Admitting: Internal Medicine

## 2023-12-08 ENCOUNTER — Encounter: Attending: Registered Nurse | Admitting: Registered Nurse

## 2023-12-08 VITALS — BP 114/71 | HR 72 | Ht 64.0 in | Wt 250.0 lb

## 2023-12-08 DIAGNOSIS — M5412 Radiculopathy, cervical region: Secondary | ICD-10-CM | POA: Diagnosis not present

## 2023-12-08 DIAGNOSIS — G894 Chronic pain syndrome: Secondary | ICD-10-CM | POA: Insufficient documentation

## 2023-12-08 DIAGNOSIS — M25512 Pain in left shoulder: Secondary | ICD-10-CM | POA: Diagnosis not present

## 2023-12-08 DIAGNOSIS — M1712 Unilateral primary osteoarthritis, left knee: Secondary | ICD-10-CM | POA: Diagnosis not present

## 2023-12-08 DIAGNOSIS — M7062 Trochanteric bursitis, left hip: Secondary | ICD-10-CM | POA: Diagnosis not present

## 2023-12-08 DIAGNOSIS — Z1231 Encounter for screening mammogram for malignant neoplasm of breast: Secondary | ICD-10-CM

## 2023-12-08 DIAGNOSIS — M546 Pain in thoracic spine: Secondary | ICD-10-CM | POA: Diagnosis not present

## 2023-12-08 DIAGNOSIS — M5416 Radiculopathy, lumbar region: Secondary | ICD-10-CM | POA: Insufficient documentation

## 2023-12-08 DIAGNOSIS — Z79891 Long term (current) use of opiate analgesic: Secondary | ICD-10-CM | POA: Insufficient documentation

## 2023-12-08 DIAGNOSIS — M25511 Pain in right shoulder: Secondary | ICD-10-CM | POA: Insufficient documentation

## 2023-12-08 DIAGNOSIS — G8929 Other chronic pain: Secondary | ICD-10-CM | POA: Insufficient documentation

## 2023-12-08 DIAGNOSIS — Z5181 Encounter for therapeutic drug level monitoring: Secondary | ICD-10-CM | POA: Insufficient documentation

## 2023-12-08 DIAGNOSIS — M1711 Unilateral primary osteoarthritis, right knee: Secondary | ICD-10-CM | POA: Diagnosis not present

## 2023-12-08 DIAGNOSIS — M542 Cervicalgia: Secondary | ICD-10-CM | POA: Diagnosis not present

## 2023-12-08 DIAGNOSIS — M7061 Trochanteric bursitis, right hip: Secondary | ICD-10-CM | POA: Insufficient documentation

## 2023-12-08 MED ORDER — OXYCODONE-ACETAMINOPHEN 10-325 MG PO TABS: 1.0000 | ORAL_TABLET | Freq: Four times a day (QID) | ORAL | 0 refills | Status: DC | PRN

## 2023-12-08 NOTE — Progress Notes (Signed)
 Subjective:    Patient ID: [name], female    DOB: 1950-03-28, 73 y.o.   MRN: [id number]  HPI: [name] is a 73 y.o. female who returns for follow up appointment for chronic pain and medication refill. She states her pain is located in her neck radiating into her bilateral shoulders, upper- lower back radiating into her bilateral hips and bilateral lower extremities, She also reports bilateral hands and bilateral feet with tingling and burning and bilateral knee pain. . She rates her  pain 7. Her current exercise regime is walking and performing stretching exercises.  [name] Morphine  equivalent is 60.00 MME.She  is also prescribed Alprazolam   by Dr. Rosamond .We have discussed the black box warning of using opioids and benzodiazepines. I highlighted the dangers of using these drugs together and discussed the adverse events including respiratory suppression, overdose, cognitive impairment and importance of compliance with current regimen. We will continue to monitor and adjust as indicated.     Last Oral Swab was Performed on 10/07/2023, it was consistent for Oxycodone . Oral Swab + Diazepam , PMP was Reviewed. She was admitted to [facility] on 09/26/2023 and discharged 09/28/2023, [name] denies taking any medication from anyone. Educated on the narcotic policy, she verbalizes understanding.     Pain Inventory Average Pain 5 Pain Right Now 7 My pain is sharp, burning, dull, stabbing, tingling, and aching  In the last 24 hours, has pain interfered with the following? General activity 6 Relation with others 6 Enjoyment of life 3 What TIME of day is your pain at its worst? morning  and night Sleep (in general) Poor  Pain is worse with: walking, bending, sitting, standing, and some activites Pain improves with: rest, heat/ice, medication, and injections Relief from Meds: 4  Family History  Problem Relation Age of Onset   Heart attack Mother    Colon cancer Mother    Heart attack  Father    Breast cancer Sister    Stroke Sister    Cancer Maternal Aunt    Cancer Maternal Uncle    Cancer Paternal Aunt    Cancer Paternal Uncle    Cancer Brother    Cancer Brother    Hemophilia Child    Cancer Other    Coronary artery disease Other    Social History   Socioeconomic History   Marital status: Divorced    Spouse name: Not on file   Number of children: 4   Years of education: Not on file   Highest education level: Not on file  Occupational History   Occupation: Disabled  Tobacco Use   Smoking status: Never   Smokeless tobacco: Never  Vaping Use   Vaping status: Never Used  Substance and Sexual Activity   Alcohol  use: No    Alcohol /week: 0.0 standard drinks of alcohol    Drug use: No   Sexual activity: Not on file  Other Topics Concern   Not on file  Social History Narrative   Patient does not get regular exercise   Denies caffeine use    Social Drivers of Corporate investment banker Strain: Low Risk  (09/26/2023)   Received from [facility]   Overall Financial Resource Strain (CARDIA)    How hard is it for you to pay for the very basics like food, housing, medical care, and heating?: Not hard at all  Food Insecurity: No Food Insecurity (09/26/2023)   Received from [facility]   Hunger Vital Sign  Within the past 12 months, you worried that your food would run out before you got the money to buy more.: Never true    Within the past 12 months, the food you bought just didn't last and you didn't have money to get more.: Never true  Transportation Needs: No Transportation Needs (09/26/2023)   Received from [facility]   PRAPARE - Transportation    Lack of Transportation (Medical): No    Lack of Transportation (Non-Medical): No  Physical Activity: Insufficiently Active (09/26/2023)   Received from [facility]   Exercise Vital Sign    On average, how many days per week do you engage in moderate to strenuous exercise (like a brisk walk)?:  7 days    On average, how many minutes do you engage in exercise at this level?: 10 min  Stress: No Stress Concern Present (09/26/2023)   Received from [facility] - Occupational Stress Questionnaire    Do you feel stress - tense, restless, nervous, or anxious, or unable to sleep at night because your mind is troubled all the time - these days?: Not at all  Social Connections: Moderately Integrated (09/26/2023)   Received from [facility]   Social Connection and Isolation Panel    In a typical week, how many times do you talk on the phone with family, friends, or neighbors?: More than three times a week    How often do you get together with friends or relatives?: More than three times a week    How often do you attend church or religious services?: More than 4 times per year    Do you belong to any clubs or organizations such as church groups, unions, fraternal or athletic groups, or school groups?: No    How often do you attend meetings of the clubs or organizations you belong to?: Never    Are you married, widowed, divorced, separated, never married, or living with a partner?: Living with partner   Past Surgical History:  Procedure Laterality Date   ABDOMINAL HYSTERECTOMY     Arm Surgery Bilateral    due to fall-pt broke both forearms   BIOPSY N/A 07/01/2013   Procedure: BIOPSY;  Surgeon: Claudis RAYMOND Rivet, MD;  [facility]: [facility];  Service: Endoscopy;  Laterality: N/A;   CHOLECYSTECTOMY     COLONOSCOPY  08/06/2011   Procedure: COLONOSCOPY;  Surgeon: Claudis RAYMOND Rivet, MD;  [facility];  Service: Endoscopy;  Laterality: N/A;  215   COLONOSCOPY WITH PROPOFOL  N/A 12/31/2012   Procedure: COLONOSCOPY WITH PROPOFOL ;  Surgeon: Claudis RAYMOND Rivet, MD;  Location: [facility];  Service: Endoscopy;  Laterality: N/A;  in cecum at 0814, total withdrawal time   COLONOSCOPY WITH PROPOFOL  N/A 02/05/2018   Procedure: COLONOSCOPY WITH PROPOFOL ;   Surgeon: Rivet Claudis RAYMOND, MD;  [facility];  Service: Endoscopy;  Laterality: N/A;  1:25   CORONARY ANGIOPLASTY WITH STENT PLACEMENT     CYSTOSCOPY W/ URETERAL STENT PLACEMENT Right 11/10/2019   at [facility]   CYSTOSCOPY WITH RETROGRADE PYELOGRAM, URETEROSCOPY AND STENT PLACEMENT Right 12/22/2019   Procedure: CYSTOSCOPY WITH RIGHT URETERAL STENT REMOVAL; RIGHT RETROGRADE PYELOGRAM,RIGHT URETEROSCOPY;  Surgeon: Sherrilee Belvie CROME, MD;  Location: [facility];  Service: Urology;  Laterality: Right;   ESOPHAGEAL DILATION N/A 06/07/2015   Procedure: ESOPHAGEAL DILATION;  Surgeon: Claudis RAYMOND Rivet, MD;  [facility];  Service: Endoscopy;  Laterality: N/A;   ESOPHAGOGASTRODUODENOSCOPY N/A  06/07/2015   Procedure: ESOPHAGOGASTRODUODENOSCOPY (EGD);  Surgeon: Claudis RAYMOND Rivet, MD;  [facility];  Service: Endoscopy;  Laterality: N/A;  2:45 - moved to 1:00 - Ann to notify pt   ESOPHAGOGASTRODUODENOSCOPY (EGD) WITH PROPOFOL  N/A 12/31/2012   Procedure: ESOPHAGOGASTRODUODENOSCOPY (EGD) WITH PROPOFOL ;  Surgeon: Claudis RAYMOND Rivet, MD;  [facility]: [facility];  Service: Endoscopy;  Laterality: N/A;  GE junction 37,   ESOPHAGOGASTRODUODENOSCOPY (EGD) WITH PROPOFOL  N/A 07/01/2013   Procedure: ESOPHAGOGASTRODUODENOSCOPY (EGD) WITH PROPOFOL ;  Surgeon: Claudis RAYMOND Rivet, MD;  [facility]: [facility];  Service: Endoscopy;  Laterality: N/A;  950   MALONEY DILATION N/A 12/31/2012   Procedure: MALONEY DILATION;  Surgeon: Claudis RAYMOND Rivet, MD;  [facility]: [facility];  Service: Endoscopy;  Laterality: N/A;  used a # 54,#56   MALONEY DILATION N/A 07/01/2013   Procedure: MALONEY DILATION;  Surgeon: Claudis RAYMOND Rivet, MD;  [facility]: [facility];  Service: Endoscopy;  Laterality: N/A;  54/58; no heme present   POLYPECTOMY  02/05/2018   Procedure: POLYPECTOMY;  Surgeon: Rivet Claudis RAYMOND, MD;  [facility];  Service: Endoscopy;;  distal rectum (HS)   STONE EXTRACTION WITH BASKET Right 12/22/2019   Procedure: STONE EXTRACTION WITH  BASKET;  Surgeon: Sherrilee Belvie CROME, MD;  [facility]: [facility];  Service: Urology;  Laterality: Right;   Past Surgical History:  Procedure Laterality Date   ABDOMINAL HYSTERECTOMY     Arm Surgery Bilateral    due to fall-pt broke both forearms   BIOPSY N/A 07/01/2013   Procedure: BIOPSY;  Surgeon: Claudis RAYMOND Rivet, MD;  [facility];  Service: Endoscopy;  Laterality: N/A;   CHOLECYSTECTOMY     COLONOSCOPY  08/06/2011   Procedure: COLONOSCOPY;  Surgeon: Claudis RAYMOND Rivet, MD;  [facility];  Service: Endoscopy;  Laterality: N/A;  215   COLONOSCOPY WITH PROPOFOL  N/A 12/31/2012   Procedure: COLONOSCOPY WITH PROPOFOL ;  Surgeon: Claudis RAYMOND Rivet, MD;  [facility]: [facility];  Service: Endoscopy;  Laterality: N/A;  in cecum at 0814, total withdrawal time   COLONOSCOPY WITH PROPOFOL  N/A 02/05/2018   Procedure: COLONOSCOPY WITH PROPOFOL ;  Surgeon: Rivet Claudis RAYMOND, MD;  [facility];  Service: Endoscopy;  Laterality: N/A;  1:25   CORONARY ANGIOPLASTY WITH STENT PLACEMENT     CYSTOSCOPY W/ URETERAL STENT PLACEMENT Right 11/10/2019   at [facility]   CYSTOSCOPY WITH RETROGRADE PYELOGRAM, URETEROSCOPY AND STENT PLACEMENT Right 12/22/2019   Procedure: CYSTOSCOPY WITH RIGHT URETERAL STENT REMOVAL; RIGHT RETROGRADE PYELOGRAM,RIGHT URETEROSCOPY;  Surgeon: Sherrilee Belvie CROME, MD;  [facility]: [facility];  Service: Urology;  Laterality: Right;   ESOPHAGEAL DILATION N/A 06/07/2015   Procedure: ESOPHAGEAL DILATION;  Surgeon: Claudis RAYMOND Rivet, MD;  [facility];  Service: Endoscopy;  Laterality: N/A;   ESOPHAGOGASTRODUODENOSCOPY N/A 06/07/2015   Procedure: ESOPHAGOGASTRODUODENOSCOPY (EGD);  Surgeon: Claudis RAYMOND Rivet, MD;  [facility];  Service: Endoscopy;  Laterality: N/A;  2:45 - moved to 1:00 - Ann to notify pt   ESOPHAGOGASTRODUODENOSCOPY (EGD) WITH PROPOFOL  N/A 12/31/2012   Procedure: ESOPHAGOGASTRODUODENOSCOPY (EGD) WITH PROPOFOL ;  Surgeon: Claudis RAYMOND Rivet, MD;  [facility];   Service: Endoscopy;  Laterality: N/A;  GE junction 37,   ESOPHAGOGASTRODUODENOSCOPY (EGD) WITH PROPOFOL  N/A 07/01/2013   Procedure: ESOPHAGOGASTRODUODENOSCOPY (EGD) WITH PROPOFOL ;  Surgeon: Claudis RAYMOND Rivet, MD;  [facility]: [facility];  Service: Endoscopy;  Laterality: N/A;  950   MALONEY DILATION N/A 12/31/2012   Procedure: AGAPITO DILATION;  Surgeon: Claudis RAYMOND Rivet, MD;  [facility]: [facility];  Service: Endoscopy;  Laterality: N/A;  used a # 54,#56   MALONEY DILATION N/A 07/01/2013   Procedure: MALONEY DILATION;  Surgeon: Claudis RAYMOND Rivet, MD;  [facility];  Service: Endoscopy;  Laterality: N/A;  54/58; no heme present   POLYPECTOMY  02/05/2018   Procedure: POLYPECTOMY;  Surgeon: Rivet Claudis RAYMOND, MD;  [facility];  Service: Endoscopy;;  distal rectum (HS)   STONE EXTRACTION WITH BASKET Right 12/22/2019   Procedure: STONE EXTRACTION WITH BASKET;  Surgeon: Sherrilee Belvie CROME, MD;  Location: [facility];  Service: Urology;  Laterality: Right;   Past Medical History:  Diagnosis Date   Anxiety and depression    Arthritis    CAD (coronary artery disease)    Stent placement circumflex coronary 2007, catheterization 2008 patent stents. 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    High cholesterol    Hx of blood clots    Hypertension    MI (myocardial infarction) (HCC)    2007   Obstructive sleep apnea    CPAP, setting ?2   Palpitations    Sleep apnea    Tachycardia    BP 98/63   Pulse 72   Ht 5' 4 (1.626 m)   Wt 250 lb (113.4 kg)   SpO2 94%   BMI 42.91 kg/m   Opioid Risk Score:   Fall Risk Score:  `1  Depression screen  [facility] 2/9     10/07/2023    1:29 PM 09/10/2023   12:57 PM 08/12/2023    3:07 PM 07/14/2023   12:55 PM 06/12/2023   10:51 AM 05/08/2023    1:20 PM 03/25/2023    2:24 PM  Depression screen PHQ 2/9  Decreased Interest 3 1 3 1 1 1 1   Down, Depressed, Hopeless 3 1 3 1 1 1 1   PHQ - 2 Score 6 2 6 2 2 2 2   Altered sleeping 3  3  2     Tired, decreased energy 3  3  2     Change in appetite 2  0  1    Feeling bad or failure about yourself  2  0  0    Trouble concentrating 2  0  0    Moving slowly or fidgety/restless 0  0  0    Suicidal thoughts 0  0  0    PHQ-9 Score 18  12  7     Difficult doing work/chores Extremely dIfficult  Not difficult at all         Review of Systems  Gastrointestinal:  Positive for diarrhea.  Musculoskeletal:  Positive for gait problem and myalgias.  Neurological:  Positive for headaches.  All other systems reviewed and are negative.      Objective:   Physical Exam Vitals and nursing note reviewed.  Constitutional:      Appearance: Normal appearance. She is obese.  Neck:     Comments: Cervical Paraspinal Tenderness: C-5-C-6  Cardiovascular:     Rate and Rhythm: Normal rate and regular rhythm.  Pulses: Normal pulses.     Heart sounds: Normal heart sounds.  Pulmonary:     Effort: Pulmonary effort is normal.     Breath sounds: Normal breath sounds.  Musculoskeletal:     Comments: Normal Muscle Bulk and Muscle Testing Reveals:  Upper Extremities:Full  ROM and Muscle Strength 5/5 Bilateral AC Joint Tenderness Thoracic and Lumbar Hypersensitivity  Bilateral Greater Trochanter Tenderness Lower Extremities: Full ROM and Muscle Strength 5/5 Arises from Chair slowly using walker for support Antalgic  Gait     Skin:    General: Skin is warm and dry.  Neurological:     General: No focal deficit present.     Mental Status: She is alert and oriented to person, place, and time.          Assessment & Plan:  1.Lumbar pain lumbar spondylosis:  Encouraged to  continue to increase Activity as tolerated. 12/08/2023 Continue current medication regimen. Refilled Oxycodone  10/325 mg one tablet every 6 hours as needed for pain. #120.  We will continue the opioid monitoring program, this consists of regular clinic visits, examinations, urine drug screen, pill counts as well as use of [place]  Controlled Substance Reporting system. A 12 month History has been reviewed on the   Controlled Substance Reporting System on 12/08/2023.  2. Lumbar Radiculitis: Continue current medication regimen with Gabapentin . 12/08/2023 3. Bilateral Knee Pain/ Degenerative: Continue Voltaren  Gel. Continue to Monitor. 12/08/2023 4. Severe diabetic poly neuropathy: Continue current medication regimen with Gabapentin . 079 23/2025 5. Insomnia: No complaints today. Continue to Monitor.  12/08/2023 6.Anxiety/Depression: Continue Current Medication regimen as prescribed by PCP. SABRA 12/08/2023 7. De Quervains Tenosynovitis: No Complaints Today. 09/23//2025 8. Bilateral  Greater Trochanteric Bursitis: Continue with Ice and Heat Therapy. Continue to Monitor. 12/08/2023.  9. Cervicalgia/ Cervical Radiculitis:Continue Gabapentin . Continue with HEP as Tolerated and continue to monitor. 12/08/2023 10. Chronic Bilateral Thoracic Back Pain:  Continue HEP as Tolerated. Continue current medication regimen. Continue to monitor. 12/08/2023.   F/U in 1 month

## 2023-12-09 ENCOUNTER — Other Ambulatory Visit: Payer: Self-pay | Admitting: Neurology

## 2023-12-11 ENCOUNTER — Other Ambulatory Visit: Payer: Self-pay | Admitting: Neurology

## 2023-12-15 DIAGNOSIS — G4733 Obstructive sleep apnea (adult) (pediatric): Secondary | ICD-10-CM | POA: Diagnosis not present

## 2023-12-15 DIAGNOSIS — M797 Fibromyalgia: Secondary | ICD-10-CM | POA: Diagnosis not present

## 2023-12-15 DIAGNOSIS — I509 Heart failure, unspecified: Secondary | ICD-10-CM | POA: Diagnosis not present

## 2023-12-15 DIAGNOSIS — R0902 Hypoxemia: Secondary | ICD-10-CM | POA: Diagnosis not present

## 2023-12-15 DIAGNOSIS — M159 Polyosteoarthritis, unspecified: Secondary | ICD-10-CM | POA: Diagnosis not present

## 2023-12-16 ENCOUNTER — Encounter: Payer: Self-pay | Admitting: Registered Nurse

## 2023-12-25 DIAGNOSIS — R600 Localized edema: Secondary | ICD-10-CM | POA: Diagnosis not present

## 2023-12-25 DIAGNOSIS — R079 Chest pain, unspecified: Secondary | ICD-10-CM | POA: Diagnosis not present

## 2023-12-25 DIAGNOSIS — E119 Type 2 diabetes mellitus without complications: Secondary | ICD-10-CM | POA: Diagnosis not present

## 2023-12-25 DIAGNOSIS — I2510 Atherosclerotic heart disease of native coronary artery without angina pectoris: Secondary | ICD-10-CM | POA: Diagnosis not present

## 2023-12-25 DIAGNOSIS — J449 Chronic obstructive pulmonary disease, unspecified: Secondary | ICD-10-CM | POA: Diagnosis not present

## 2023-12-25 DIAGNOSIS — Z7984 Long term (current) use of oral hypoglycemic drugs: Secondary | ICD-10-CM | POA: Diagnosis not present

## 2023-12-25 DIAGNOSIS — I10 Essential (primary) hypertension: Secondary | ICD-10-CM | POA: Diagnosis not present

## 2023-12-30 ENCOUNTER — Encounter (INDEPENDENT_AMBULATORY_CARE_PROVIDER_SITE_OTHER): Payer: Self-pay | Admitting: Gastroenterology

## 2024-01-01 ENCOUNTER — Encounter: Admitting: Registered Nurse

## 2024-01-01 DIAGNOSIS — E1140 Type 2 diabetes mellitus with diabetic neuropathy, unspecified: Secondary | ICD-10-CM | POA: Diagnosis not present

## 2024-01-01 DIAGNOSIS — Z299 Encounter for prophylactic measures, unspecified: Secondary | ICD-10-CM | POA: Diagnosis not present

## 2024-01-01 DIAGNOSIS — I10 Essential (primary) hypertension: Secondary | ICD-10-CM | POA: Diagnosis not present

## 2024-01-04 ENCOUNTER — Encounter: Admitting: Registered Nurse

## 2024-01-05 NOTE — Progress Notes (Unsigned)
 Subjective:    Patient ID: [name], female    DOB: 07/08/1950, 73 y.o.   MRN: [id number]  HPI: [name] is a 73 y.o. female who returns for follow up appointment for chronic pain and medication refill. She states her pain is located in her neck radiating into her bilateral shoulders, upper- lower back radiating into her bilateral hips and bilateral lower extremities. She rates her pain 6. Her current exercise regime is walking and performing stretching exercises.  [name] Morphine  equivalent is 60.00 MME. She is also prescribed Alprazolam  by Dr. Rosamond .We have discussed the black box warning of using opioids and benzodiazepines. I highlighted the dangers of using these drugs together and discussed the adverse events including respiratory suppression, overdose, cognitive impairment and importance of compliance with current regimen. We will continue to monitor and adjust as indicated.    Oral Swab was Performed today.      Pain Inventory Average Pain 5 Pain Right Now 6 My pain is constant, sharp, burning, dull, stabbing, and tingling  In the last 24 hours, has pain interfered with the following? General activity 8 Relation with others 4 Enjoyment of life 8 What TIME of day is your pain at its worst? morning , daytime, evening, and night Sleep (in general) Poor  Pain is worse with: walking, bending, sitting, standing, unsure, and some activites Pain improves with: rest, therapy/exercise, medication, and ice Relief from Meds: 4  Family History  Problem Relation Age of Onset   Heart attack Mother    Colon cancer Mother    Heart attack Father    Breast cancer Sister    Stroke Sister    Cancer Maternal Aunt    Cancer Maternal Uncle    Cancer Paternal Aunt    Cancer Paternal Uncle    Cancer Brother    Cancer Brother    Hemophilia Child    Cancer Other    Coronary artery disease Other    Social History   Socioeconomic History   Marital status: Divorced     Spouse name: Not on file   Number of children: 4   Years of education: Not on file   Highest education level: Not on file  Occupational History   Occupation: Disabled  Tobacco Use   Smoking status: Never   Smokeless tobacco: Never  Vaping Use   Vaping status: Never Used  Substance and Sexual Activity   Alcohol  use: No    Alcohol /week: 0.0 standard drinks of alcohol    Drug use: No   Sexual activity: Not on file  Other Topics Concern   Not on file  Social History Narrative   Patient does not get regular exercise   Denies caffeine use    Social Drivers of Corporate investment banker Strain: Low Risk  (09/26/2023)   Received from [facility]   Overall Financial Resource Strain (CARDIA)    How hard is it for you to pay for the very basics like food, housing, medical care, and heating?: Not hard at all  Food Insecurity: No Food Insecurity (09/26/2023)   Received from [facility]   Hunger Vital Sign    Within the past 12 months, you worried that your food would run out before you got the money to buy more.: Never true    Within the past 12 months, the food you bought just didn't last and you didn't have money to get more.: Never true  Transportation Needs: No Transportation Needs (  09/26/2023)   Received from [facility]   PRAPARE - Transportation    Lack of Transportation (Medical): No    Lack of Transportation (Non-Medical): No  Physical Activity: Insufficiently Active (09/26/2023)   Received from [facility]   Exercise Vital Sign    On average, how many days per week do you engage in moderate to strenuous exercise (like a brisk walk)?: 7 days    On average, how many minutes do you engage in exercise at this level?: 10 min  Stress: No Stress Concern Present (09/26/2023)   Received from [facility] - Occupational Stress Questionnaire    Do you feel stress - tense, restless, nervous, or anxious, or unable to sleep at  night because your mind is troubled all the time - these days?: Not at all  Social Connections: Moderately Integrated (09/26/2023)   Received from [facility]   Social Connection and Isolation Panel    In a typical week, how many times do you talk on the phone with family, friends, or neighbors?: More than three times a week    How often do you get together with friends or relatives?: More than three times a week    How often do you attend church or religious services?: More than 4 times per year    Do you belong to any clubs or organizations such as church groups, unions, fraternal or athletic groups, or school groups?: No    How often do you attend meetings of the clubs or organizations you belong to?: Never    Are you married, widowed, divorced, separated, never married, or living with a partner?: Living with partner   Past Surgical History:  Procedure Laterality Date   ABDOMINAL HYSTERECTOMY     Arm Surgery Bilateral    due to fall-pt broke both forearms   BIOPSY N/A 07/01/2013   Procedure: BIOPSY;  Surgeon: Claudis RAYMOND Rivet, MD;  [facility]: [facility];  Service: Endoscopy;  Laterality: N/A;   CHOLECYSTECTOMY     COLONOSCOPY  08/06/2011   Procedure: COLONOSCOPY;  Surgeon: Claudis RAYMOND Rivet, MD;  [facility];  Service: Endoscopy;  Laterality: N/A;  215   COLONOSCOPY WITH PROPOFOL  N/A 12/31/2012   Procedure: COLONOSCOPY WITH PROPOFOL ;  Surgeon: Claudis RAYMOND Rivet, MD;  Location: [facility];  Service: Endoscopy;  Laterality: N/A;  in cecum at 0814, total withdrawal time   COLONOSCOPY WITH PROPOFOL  N/A 02/05/2018   Procedure: COLONOSCOPY WITH PROPOFOL ;  Surgeon: Rivet Claudis RAYMOND, MD;  [facility];  Service: Endoscopy;  Laterality: N/A;  1:25   CORONARY ANGIOPLASTY WITH STENT PLACEMENT     CYSTOSCOPY W/ URETERAL STENT PLACEMENT Right 11/10/2019   at [facility]   CYSTOSCOPY WITH RETROGRADE PYELOGRAM, URETEROSCOPY AND STENT PLACEMENT Right 12/22/2019   Procedure: CYSTOSCOPY WITH  RIGHT URETERAL STENT REMOVAL; RIGHT RETROGRADE PYELOGRAM,RIGHT URETEROSCOPY;  Surgeon: Sherrilee Belvie CROME, MD;  Location: [facility];  Service: Urology;  Laterality: Right;   ESOPHAGEAL DILATION N/A 06/07/2015   Procedure: ESOPHAGEAL DILATION;  Surgeon: Claudis RAYMOND Rivet, MD;  [facility];  Service: Endoscopy;  Laterality: N/A;   ESOPHAGOGASTRODUODENOSCOPY N/A 06/07/2015   Procedure: ESOPHAGOGASTRODUODENOSCOPY (EGD);  Surgeon: Claudis RAYMOND Rivet, MD;  [facility];  Service: Endoscopy;  Laterality: N/A;  2:45 - moved to 1:00 - Ann to notify pt   ESOPHAGOGASTRODUODENOSCOPY (EGD) WITH PROPOFOL  N/A 12/31/2012   Procedure: ESOPHAGOGASTRODUODENOSCOPY (EGD) WITH PROPOFOL ;  Surgeon: Claudis RAYMOND Rivet,  MD;  [facility]: [facility];  Service: Endoscopy;  Laterality: N/A;  GE junction 37,   ESOPHAGOGASTRODUODENOSCOPY (EGD) WITH PROPOFOL  N/A 07/01/2013   Procedure: ESOPHAGOGASTRODUODENOSCOPY (EGD) WITH PROPOFOL ;  Surgeon: Claudis RAYMOND Rivet, MD;  [facility]: [facility];  Service: Endoscopy;  Laterality: N/A;  950   MALONEY DILATION N/A 12/31/2012   Procedure: MALONEY DILATION;  Surgeon: Claudis RAYMOND Rivet, MD;  [facility]: [facility];  Service: Endoscopy;  Laterality: N/A;  used a # 54,#56   MALONEY DILATION N/A 07/01/2013   Procedure: MALONEY DILATION;  Surgeon: Claudis RAYMOND Rivet, MD;  [facility]: [facility];  Service: Endoscopy;  Laterality: N/A;  54/58; no heme present   POLYPECTOMY  02/05/2018   Procedure: POLYPECTOMY;  Surgeon: Rivet Claudis RAYMOND, MD;  [facility];  Service: Endoscopy;;  distal rectum (HS)   STONE EXTRACTION WITH BASKET Right 12/22/2019   Procedure: STONE EXTRACTION WITH BASKET;  Surgeon: Sherrilee Belvie CROME, MD;  [facility]: [facility];  Service: Urology;  Laterality: Right;   Past Surgical History:  Procedure Laterality Date   ABDOMINAL HYSTERECTOMY     Arm Surgery Bilateral    due to fall-pt broke both forearms   BIOPSY N/A 07/01/2013   Procedure: BIOPSY;  Surgeon: Claudis RAYMOND Rivet, MD;  [facility];  Service: Endoscopy;  Laterality: N/A;   CHOLECYSTECTOMY     COLONOSCOPY  08/06/2011   Procedure: COLONOSCOPY;  Surgeon: Claudis RAYMOND Rivet, MD;  [facility];  Service: Endoscopy;  Laterality: N/A;  215   COLONOSCOPY WITH PROPOFOL  N/A 12/31/2012   Procedure: COLONOSCOPY WITH PROPOFOL ;  Surgeon: Claudis RAYMOND Rivet, MD;  [facility]: [facility];  Service: Endoscopy;  Laterality: N/A;  in cecum at 0814, total withdrawal time   COLONOSCOPY WITH PROPOFOL  N/A 02/05/2018   Procedure: COLONOSCOPY WITH PROPOFOL ;  Surgeon: Rivet Claudis RAYMOND, MD;  [facility];  Service: Endoscopy;  Laterality: N/A;  1:25   CORONARY ANGIOPLASTY WITH STENT PLACEMENT     CYSTOSCOPY W/ URETERAL STENT PLACEMENT Right 11/10/2019   at [facility]   CYSTOSCOPY WITH RETROGRADE PYELOGRAM, URETEROSCOPY AND STENT PLACEMENT Right 12/22/2019   Procedure: CYSTOSCOPY WITH RIGHT URETERAL STENT REMOVAL; RIGHT RETROGRADE PYELOGRAM,RIGHT URETEROSCOPY;  Surgeon: Sherrilee Belvie CROME, MD;  [facility]: [facility];  Service: Urology;  Laterality: Right;   ESOPHAGEAL DILATION N/A 06/07/2015   Procedure: ESOPHAGEAL DILATION;  Surgeon: Claudis RAYMOND Rivet, MD;  [facility];  Service: Endoscopy;  Laterality: N/A;   ESOPHAGOGASTRODUODENOSCOPY N/A 06/07/2015   Procedure: ESOPHAGOGASTRODUODENOSCOPY (EGD);  Surgeon: Claudis RAYMOND Rivet, MD;  [facility];  Service: Endoscopy;  Laterality: N/A;  2:45 - moved to 1:00 - Ann to notify pt   ESOPHAGOGASTRODUODENOSCOPY (EGD) WITH PROPOFOL  N/A 12/31/2012   Procedure: ESOPHAGOGASTRODUODENOSCOPY (EGD) WITH PROPOFOL ;  Surgeon: Claudis RAYMOND Rivet, MD;  [facility];  Service: Endoscopy;  Laterality: N/A;  GE junction 37,   ESOPHAGOGASTRODUODENOSCOPY (EGD) WITH PROPOFOL  N/A 07/01/2013   Procedure: ESOPHAGOGASTRODUODENOSCOPY (EGD) WITH PROPOFOL ;  Surgeon: Claudis RAYMOND Rivet, MD;  [facility]: [facility];  Service: Endoscopy;  Laterality: N/A;  950   MALONEY DILATION N/A 12/31/2012   Procedure: MALONEY  DILATION;  Surgeon: Claudis RAYMOND Rivet, MD;  [facility]: [facility];  Service: Endoscopy;  Laterality: N/A;  used a # 54,#56   MALONEY DILATION N/A 07/01/2013   Procedure: MALONEY DILATION;  Surgeon: Claudis RAYMOND Rivet, MD;  [facility]: [facility];  Service: Endoscopy;  Laterality: N/A;  54/58; no heme present   POLYPECTOMY  02/05/2018   Procedure: POLYPECTOMY;  Surgeon: Golda Claudis PENNER, MD;  [facility];  Service: Endoscopy;;  distal rectum (HS)   STONE EXTRACTION WITH BASKET Right 12/22/2019   Procedure: STONE EXTRACTION WITH BASKET;  Surgeon: Sherrilee Belvie CROME, MD;  Location: [facility];  Service: Urology;  Laterality: Right;   Past Medical History:  Diagnosis Date   Anxiety and depression    Arthritis    CAD (coronary artery disease)    Stent placement circumflex coronary 2007, catheterization 2008 patent stents. 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  Opioid Risk Score:   Fall Risk Score:  `1  Depression screen [facility] 2/9     10/07/2023    1:29 PM 09/10/2023   12:57 PM 08/12/2023    3:07 PM 07/14/2023   12:55 PM 06/12/2023   10:51 AM 05/08/2023    1:20 PM 03/25/2023    2:24 PM  Depression screen PHQ 2/9  Decreased Interest 3 1 3 1 1 1 1   Down, Depressed, Hopeless 3 1 3 1 1 1 1   PHQ - 2 Score 6 2 6 2 2 2 2   Altered sleeping 3  3  2     Tired, decreased energy 3  3  2     Change in  appetite 2  0  1    Feeling bad or failure about yourself  2  0  0    Trouble concentrating 2  0  0    Moving slowly or fidgety/restless 0  0  0    Suicidal thoughts 0  0  0    PHQ-9 Score 18  12  7     Difficult doing work/chores Extremely dIfficult  Not difficult at all        Review of Systems  Musculoskeletal:  Positive for back pain, gait problem and neck pain.       Pain from both hips down to both feet  All other systems reviewed and are negative.      Objective:   Physical Exam Vitals and nursing note reviewed.  Constitutional:      Appearance: Normal appearance.  Neck:     Comments: Cervical Paraspinal Tenderness: C-5-C-6 Cardiovascular:     Rate and Rhythm: Normal rate and regular rhythm.     Pulses: Normal pulses.     Heart sounds: Normal heart sounds.  Pulmonary:     Effort: Pulmonary effort is normal.     Breath sounds: Normal breath sounds.  Musculoskeletal:     Comments: Normal Muscle Bulk and Muscle Testing Reveals:  Upper Extremities: Full ROM and Muscle Strength 5/5 Bilateral AC Joint Tenderness  Thoracic, and Lumbar Hypersensitivity Lower Extremities: Full ROM and Muscle Strength 5.5 Bilateral Lower  Extremities Flexion Produces Pain into her bilateral lower extremities Arises from Chair slowly Antalgic  Gait     Skin:    General: Skin is warm and dry.  Neurological:     Mental Status: She is alert and oriented to person, place, and time.  Psychiatric:        Mood and Affect: Mood normal.        Behavior: Behavior normal.          Assessment & Plan:  1.Lumbar pain lumbar spondylosis:  Encouraged to continue to increase Activity as tolerated. 01/06/2024 Continue current medication regimen. Refilled Oxycodone  10/325 mg one tablet every 6 hours as needed for pain. #120.  We will continue the opioid monitoring program, this consists of regular clinic visits, examinations, urine drug screen, pill counts as well as use of [place]  Controlled  Substance Reporting system. A 12 month History has been reviewed on the [place]  Controlled Substance Reporting System on 01/06/2024.  2. Lumbar Radiculitis: Continue current medication regimen with Gabapentin . 01/06/2024 3. Bilateral Knee Pain/ Degenerative: Continue Voltaren  Gel. Continue to Monitor. 01/06/2024 4. Severe diabetic poly neuropathy: Continue current medication regimen with Gabapentin . 10/ 22/2025 5. Insomnia: No complaints today. Continue to Monitor.  01/06/2024 6.Anxiety/Depression: Continue Current Medication regimen as prescribed by PCP. . 01/06/2024 7. De Quervains Tenosynovitis: No Complaints Today. 10/22//2025 8. Bilateral  Greater Trochanteric Bursitis: Continue with Ice and Heat Therapy. Continue to Monitor. 01/06/2024.  9. Cervicalgia/ Cervical Radiculitis: Continue Gabapentin . Continue with HEP as Tolerated and continue to monitor. 01/06/2024 10. Chronic Bilateral Thoracic Back Pain:  Continue HEP as Tolerated. Continue current medication regimen. Continue to monitor. 01/06/2024.   F/U in 1 month

## 2024-01-06 ENCOUNTER — Encounter: Attending: Registered Nurse | Admitting: Registered Nurse

## 2024-01-06 ENCOUNTER — Encounter: Payer: Self-pay | Admitting: Registered Nurse

## 2024-01-06 VITALS — BP 111/68 | HR 75 | Ht 64.0 in | Wt 250.0 lb

## 2024-01-06 DIAGNOSIS — M546 Pain in thoracic spine: Secondary | ICD-10-CM | POA: Insufficient documentation

## 2024-01-06 DIAGNOSIS — Z79891 Long term (current) use of opiate analgesic: Secondary | ICD-10-CM | POA: Diagnosis not present

## 2024-01-06 DIAGNOSIS — M1712 Unilateral primary osteoarthritis, left knee: Secondary | ICD-10-CM | POA: Insufficient documentation

## 2024-01-06 DIAGNOSIS — M5412 Radiculopathy, cervical region: Secondary | ICD-10-CM | POA: Insufficient documentation

## 2024-01-06 DIAGNOSIS — M7062 Trochanteric bursitis, left hip: Secondary | ICD-10-CM | POA: Insufficient documentation

## 2024-01-06 DIAGNOSIS — M25511 Pain in right shoulder: Secondary | ICD-10-CM | POA: Insufficient documentation

## 2024-01-06 DIAGNOSIS — G8929 Other chronic pain: Secondary | ICD-10-CM | POA: Diagnosis present

## 2024-01-06 DIAGNOSIS — M1711 Unilateral primary osteoarthritis, right knee: Secondary | ICD-10-CM | POA: Insufficient documentation

## 2024-01-06 DIAGNOSIS — M542 Cervicalgia: Secondary | ICD-10-CM | POA: Insufficient documentation

## 2024-01-06 DIAGNOSIS — M25512 Pain in left shoulder: Secondary | ICD-10-CM | POA: Diagnosis present

## 2024-01-06 DIAGNOSIS — M5416 Radiculopathy, lumbar region: Secondary | ICD-10-CM | POA: Diagnosis not present

## 2024-01-06 DIAGNOSIS — M7061 Trochanteric bursitis, right hip: Secondary | ICD-10-CM | POA: Diagnosis not present

## 2024-01-06 DIAGNOSIS — Z5181 Encounter for therapeutic drug level monitoring: Secondary | ICD-10-CM | POA: Diagnosis not present

## 2024-01-06 DIAGNOSIS — G894 Chronic pain syndrome: Secondary | ICD-10-CM | POA: Insufficient documentation

## 2024-01-06 MED ORDER — OXYCODONE-ACETAMINOPHEN 10-325 MG PO TABS: 1.0000 | ORAL_TABLET | Freq: Four times a day (QID) | ORAL | 0 refills | Status: DC | PRN

## 2024-01-07 ENCOUNTER — Other Ambulatory Visit: Payer: Self-pay | Admitting: Neurology

## 2024-01-07 NOTE — Telephone Encounter (Signed)
 LOV 08/28/22  No Follow-Up visit scheduled at this time  Last refill: 07/07/23, #60, 3rf  Please advise, thank you!

## 2024-01-10 LAB — DRUG TOX MONITOR 1 W/CONF, ORAL FLD
AMINOCLONAZEPAM: NEGATIVE ng/mL (ref <0.50)
Alprazolam: 0.92 ng/mL — ABNORMAL HIGH (ref <0.50)
Amphetamines: NEGATIVE ng/mL (ref <10)
Barbiturates: NEGATIVE ng/mL (ref <10)
Benzodiazepines: POSITIVE ng/mL — AB (ref <0.50)
Buprenorphine: NEGATIVE ng/mL (ref <0.10)
Chlordiazepoxide: NEGATIVE ng/mL (ref <0.50)
Clonazepam: NEGATIVE ng/mL (ref <0.50)
Cocaine: NEGATIVE ng/mL (ref <5.0)
Codeine: NEGATIVE ng/mL (ref <2.5)
Diazepam: NEGATIVE ng/mL (ref <0.50)
Dihydrocodeine: NEGATIVE ng/mL (ref <2.5)
Fentanyl: NEGATIVE ng/mL (ref <0.10)
Flunitrazepam: NEGATIVE ng/mL (ref <0.50)
Flurazepam: NEGATIVE ng/mL (ref <0.50)
Heroin Metabolite: NEGATIVE ng/mL (ref <1.0)
Hydrocodone: NEGATIVE ng/mL (ref <2.5)
Hydromorphone: NEGATIVE ng/mL (ref <2.5)
Lorazepam: NEGATIVE ng/mL (ref <0.50)
MARIJUANA: NEGATIVE ng/mL (ref <2.5)
MDMA: NEGATIVE ng/mL (ref <10)
Meprobamate: NEGATIVE ng/mL (ref <2.5)
Methadone: NEGATIVE ng/mL (ref <5.0)
Midazolam: NEGATIVE ng/mL (ref <0.50)
Morphine: NEGATIVE ng/mL (ref <2.5)
Nicotine Metabolite: NEGATIVE ng/mL (ref <5.0)
Nordiazepam: NEGATIVE ng/mL (ref <0.50)
Norhydrocodone: NEGATIVE ng/mL (ref <2.5)
Noroxycodone: 6.1 ng/mL — ABNORMAL HIGH (ref <2.5)
Opiates: POSITIVE ng/mL — AB (ref <2.5)
Oxazepam: NEGATIVE ng/mL (ref <0.50)
Oxycodone: 49.3 ng/mL — ABNORMAL HIGH (ref <2.5)
Oxymorphone: NEGATIVE ng/mL (ref <2.5)
Phencyclidine: NEGATIVE ng/mL (ref <10)
Tapentadol: NEGATIVE ng/mL (ref <5.0)
Temazepam: NEGATIVE ng/mL (ref <0.50)
Tramadol: NEGATIVE ng/mL (ref <5.0)
Triazolam: NEGATIVE ng/mL (ref <0.50)
Zolpidem: NEGATIVE ng/mL (ref <5.0)

## 2024-01-10 LAB — DRUG TOX ALC METAB W/CON, ORAL FLD: Alcohol Metabolite: NEGATIVE ng/mL (ref <25)

## 2024-01-14 DIAGNOSIS — M797 Fibromyalgia: Secondary | ICD-10-CM | POA: Diagnosis not present

## 2024-01-14 DIAGNOSIS — G4733 Obstructive sleep apnea (adult) (pediatric): Secondary | ICD-10-CM | POA: Diagnosis not present

## 2024-01-14 DIAGNOSIS — M159 Polyosteoarthritis, unspecified: Secondary | ICD-10-CM | POA: Diagnosis not present

## 2024-01-14 DIAGNOSIS — I509 Heart failure, unspecified: Secondary | ICD-10-CM | POA: Diagnosis not present

## 2024-01-14 DIAGNOSIS — R0902 Hypoxemia: Secondary | ICD-10-CM | POA: Diagnosis not present

## 2024-02-01 ENCOUNTER — Encounter: Admitting: Registered Nurse

## 2024-02-08 ENCOUNTER — Telehealth: Payer: Self-pay | Admitting: Registered Nurse

## 2024-02-08 ENCOUNTER — Encounter: Admitting: Registered Nurse

## 2024-02-08 NOTE — Telephone Encounter (Signed)
 P needs refill of oxycodone . Appt had to get rescheduled bc eunice has to leave today

## 2024-02-09 ENCOUNTER — Other Ambulatory Visit: Payer: Self-pay | Admitting: Physical Medicine and Rehabilitation

## 2024-02-09 ENCOUNTER — Telehealth: Payer: Self-pay

## 2024-02-09 DIAGNOSIS — M1711 Unilateral primary osteoarthritis, right knee: Secondary | ICD-10-CM

## 2024-02-09 DIAGNOSIS — M1712 Unilateral primary osteoarthritis, left knee: Secondary | ICD-10-CM

## 2024-02-09 DIAGNOSIS — G8929 Other chronic pain: Secondary | ICD-10-CM

## 2024-02-09 DIAGNOSIS — M542 Cervicalgia: Secondary | ICD-10-CM

## 2024-02-09 DIAGNOSIS — G894 Chronic pain syndrome: Secondary | ICD-10-CM

## 2024-02-09 MED ORDER — OXYCODONE-ACETAMINOPHEN 10-325 MG PO TABS: 1.0000 | ORAL_TABLET | Freq: Four times a day (QID) | ORAL | 0 refills | Status: DC | PRN

## 2024-02-10 ENCOUNTER — Telehealth: Payer: Self-pay

## 2024-02-10 ENCOUNTER — Telehealth: Payer: Self-pay | Admitting: Registered Nurse

## 2024-02-10 NOTE — Telephone Encounter (Signed)
 Called and left message to make aware of refill approval.

## 2024-02-10 NOTE — Telephone Encounter (Signed)
 Called and spoke with patient to make aware of refill sent to pharmacy.  Patient verbalized understanding

## 2024-02-10 NOTE — Telephone Encounter (Signed)
 Oxycodone  filled  by Dr Lorilee on 02/09/2024

## 2024-02-25 ENCOUNTER — Encounter: Attending: Registered Nurse | Admitting: Registered Nurse

## 2024-03-03 ENCOUNTER — Encounter: Attending: Registered Nurse | Admitting: Registered Nurse

## 2024-03-09 ENCOUNTER — Encounter: Attending: Registered Nurse | Admitting: Registered Nurse

## 2024-03-09 ENCOUNTER — Encounter: Payer: Self-pay | Admitting: Registered Nurse

## 2024-03-09 VITALS — BP 139/80 | HR 85 | Ht 64.0 in | Wt 249.4 lb

## 2024-03-09 DIAGNOSIS — M546 Pain in thoracic spine: Secondary | ICD-10-CM | POA: Insufficient documentation

## 2024-03-09 DIAGNOSIS — M1712 Unilateral primary osteoarthritis, left knee: Secondary | ICD-10-CM | POA: Diagnosis present

## 2024-03-09 DIAGNOSIS — G8929 Other chronic pain: Secondary | ICD-10-CM | POA: Diagnosis present

## 2024-03-09 DIAGNOSIS — G894 Chronic pain syndrome: Secondary | ICD-10-CM | POA: Insufficient documentation

## 2024-03-09 DIAGNOSIS — M25511 Pain in right shoulder: Secondary | ICD-10-CM | POA: Insufficient documentation

## 2024-03-09 DIAGNOSIS — M542 Cervicalgia: Secondary | ICD-10-CM | POA: Diagnosis present

## 2024-03-09 DIAGNOSIS — R0602 Shortness of breath: Secondary | ICD-10-CM | POA: Insufficient documentation

## 2024-03-09 DIAGNOSIS — M25512 Pain in left shoulder: Secondary | ICD-10-CM | POA: Insufficient documentation

## 2024-03-09 DIAGNOSIS — M1711 Unilateral primary osteoarthritis, right knee: Secondary | ICD-10-CM | POA: Diagnosis present

## 2024-03-09 MED ORDER — OXYCODONE-ACETAMINOPHEN 10-325 MG PO TABS: 1.0000 | ORAL_TABLET | Freq: Four times a day (QID) | ORAL | 0 refills | Status: DC | PRN

## 2024-03-09 NOTE — Progress Notes (Signed)
 "  Subjective:    Patient ID: [name], female    DOB: Apr 02, 1950, 73 y.o.   MRN: [id number]  HPI: [name] is a 73 y.o. female who returns for follow up appointment for chronic pain and medication refill. She states her pain is located in her neck radiating into her bilateral shoulders, lower back radiating into her bilateral hips and bilateral lower extremities. She rates her pain 7. Her current exercise regime is walking and performing stretching exercises.  [name] reports she has SOB with exertion at times, it waxes and wanes she reports she refuses ED or Urgent Care evaluation. She was instructed to call EMS if she developed any SOB, she verbalizes understanding.   [name] Morphine  equivalent is 60.00 MME. She  is also prescribed Alprazolam   by Dr. Rosamond .We have discussed the black box warning of using opioids and benzodiazepines. I highlighted the dangers of using these drugs together and discussed the adverse events including respiratory suppression, overdose, cognitive impairment and importance of compliance with current regimen. We will continue to monitor and adjust as indicated.    Last Oral Swab was Performed 01/06/2024, it was consistent.     Pain Inventory Average Pain 7 Pain Right Now 7 My pain is sharp, burning, stabbing, tingling, and aching  In the last 24 hours, has pain interfered with the following? General activity 7 Relation with others 7 Enjoyment of life 6 What TIME of day is your pain at its worst? morning  and night Sleep (in general) Fair  Pain is worse with: walking, bending, sitting, standing, and some activites Pain improves with: rest and medication Relief from Meds: 5  Family History  Problem Relation Age of Onset   Heart attack Mother    Colon cancer Mother    Heart attack Father    Breast cancer Sister    Stroke Sister    Cancer Maternal Aunt    Cancer Maternal Uncle    Cancer Paternal Aunt    Cancer Paternal Uncle     Cancer Brother    Cancer Brother    Hemophilia Child    Cancer Other    Coronary artery disease Other    Social History   Socioeconomic History   Marital status: Divorced    Spouse name: Not on file   Number of children: 4   Years of education: Not on file   Highest education level: Not on file  Occupational History   Occupation: Disabled  Tobacco Use   Smoking status: Never   Smokeless tobacco: Never  Vaping Use   Vaping status: Never Used  Substance and Sexual Activity   Alcohol  use: No    Alcohol /week: 0.0 standard drinks of alcohol    Drug use: No   Sexual activity: Not on file  Other Topics Concern   Not on file  Social History Narrative   Patient does not get regular exercise   Denies caffeine use    Social Drivers of Health   Tobacco Use: Low Risk (01/06/2024)   Patient History    Smoking Tobacco Use: Never    Smokeless Tobacco Use: Never    Passive Exposure: Not on file  Financial Resource Strain: Low Risk (09/26/2023)   Received from [facility]   Overall Financial Resource Strain (CARDIA)    How hard is it for you to pay for the very basics like food, housing, medical care, and heating?: Not hard at all  Food Insecurity: No Food Insecurity (09/26/2023)  Received from [facility]   Epic    Within the past 12 months, you worried that your food would run out before you got the money to buy more.: Never true    Within the past 12 months, the food you bought just didn't last and you didn't have money to get more.: Never true  Transportation Needs: No Transportation Needs (09/26/2023)   Received from [facility]   PRAPARE - Transportation    Lack of Transportation (Medical): No    Lack of Transportation (Non-Medical): No  Physical Activity: Insufficiently Active (09/26/2023)   Received from [facility]   Exercise Vital Sign    On average, how many days per week do you engage in moderate to strenuous exercise (like a brisk walk)?: 7 days    On  average, how many minutes do you engage in exercise at this level?: 10 min  Stress: No Stress Concern Present (09/26/2023)   Received from [facility] Occupational Health - Occupational Stress Questionnaire    Do you feel stress - tense, restless, nervous, or anxious, or unable to sleep at night because your mind is troubled all the time - these days?: Not at all  Social Connections: Moderately Integrated (09/26/2023)   Received from [facility]   Social Connection and Isolation Panel    In a typical week, how many times do you talk on the phone with family, friends, or neighbors?: More than three times a week    How often do you get together with friends or relatives?: More than three times a week    How often do you attend church or religious services?: More than 4 times per year    Do you belong to any clubs or organizations such as church groups, unions, fraternal or athletic groups, or school groups?: No    How often do you attend meetings of the clubs or organizations you belong to?: Never    Are you married, widowed, divorced, separated, never married, or living with a partner?: Living with partner  Depression (PHQ2-9): Low Risk (01/06/2024)   Depression (PHQ2-9)    PHQ-2 Score: 2  Alcohol  Screen: Not on file  Housing: Low Risk (07/21/2022)   Housing    Last Housing Risk Score: 0  Utilities: Low Risk (09/26/2023)   Received from [facility]   Utilities    Within the past 12 months, have you been unable to get utilities(heat, electricity) when it was really needed?: No  Health Literacy: Low Risk (09/26/2023)   Received from [facility] Literacy    How often do you need to have someone help you when you read instructions, pamphlets, or other written material from your doctor or pharmacy?: Never   Past Surgical History:  Procedure Laterality Date   ABDOMINAL HYSTERECTOMY     Arm Surgery Bilateral    due to fall-pt broke both forearms    BIOPSY N/A 07/01/2013   Procedure: BIOPSY;  Surgeon: Claudis RAYMOND Rivet, MD;  [facility]: [facility];  Service: Endoscopy;  Laterality: N/A;   CHOLECYSTECTOMY     COLONOSCOPY  08/06/2011   Procedure: COLONOSCOPY;  Surgeon: Claudis RAYMOND Rivet, MD;  [facility];  Service: Endoscopy;  Laterality: N/A;  215   COLONOSCOPY WITH PROPOFOL  N/A 12/31/2012   Procedure: COLONOSCOPY WITH PROPOFOL ;  Surgeon: Claudis RAYMOND Rivet, MD;  Location: [facility];  Service: Endoscopy;  Laterality: N/A;  in cecum at [id number],  total withdrawal time   COLONOSCOPY WITH PROPOFOL  N/A 02/05/2018   Procedure: COLONOSCOPY WITH PROPOFOL ;  Surgeon: Golda Claudis PENNER, MD;  [facility];  Service: Endoscopy;  Laterality: N/A;  1:25   CORONARY ANGIOPLASTY WITH STENT PLACEMENT     CYSTOSCOPY W/ URETERAL STENT PLACEMENT Right 11/10/2019   at [facility]   CYSTOSCOPY WITH RETROGRADE PYELOGRAM, URETEROSCOPY AND STENT PLACEMENT Right 12/22/2019   Procedure: CYSTOSCOPY WITH RIGHT URETERAL STENT REMOVAL; RIGHT RETROGRADE PYELOGRAM,RIGHT URETEROSCOPY;  Surgeon: Sherrilee Belvie CROME, MD;  Location: [facility];  Service: Urology;  Laterality: Right;   ESOPHAGEAL DILATION N/A 06/07/2015   Procedure: ESOPHAGEAL DILATION;  Surgeon: Claudis PENNER Golda, MD;  [facility];  Service: Endoscopy;  Laterality: N/A;   ESOPHAGOGASTRODUODENOSCOPY N/A 06/07/2015   Procedure: ESOPHAGOGASTRODUODENOSCOPY (EGD);  Surgeon: Claudis PENNER Golda, MD;  [facility];  Service: Endoscopy;  Laterality: N/A;  2:45 - moved to 1:00 - Ann to notify pt   ESOPHAGOGASTRODUODENOSCOPY (EGD) WITH PROPOFOL  N/A 12/31/2012   Procedure: ESOPHAGOGASTRODUODENOSCOPY (EGD) WITH PROPOFOL ;  Surgeon: Claudis PENNER Golda, MD;  [facility]: [facility];  Service: Endoscopy;  Laterality: N/A;  GE junction 37,   ESOPHAGOGASTRODUODENOSCOPY (EGD) WITH PROPOFOL  N/A 07/01/2013   Procedure: ESOPHAGOGASTRODUODENOSCOPY (EGD) WITH PROPOFOL ;  Surgeon: Claudis PENNER Golda, MD;  [facility]: [facility];  Service: Endoscopy;   Laterality: N/A;  950   MALONEY DILATION N/A 12/31/2012   Procedure: MALONEY DILATION;  Surgeon: Claudis PENNER Golda, MD;  [facility]: [facility];  Service: Endoscopy;  Laterality: N/A;  used a # 54,#56   MALONEY DILATION N/A 07/01/2013   Procedure: MALONEY DILATION;  Surgeon: Claudis PENNER Golda, MD;  [facility]: [facility];  Service: Endoscopy;  Laterality: N/A;  54/58; no heme present   POLYPECTOMY  02/05/2018   Procedure: POLYPECTOMY;  Surgeon: Golda Claudis PENNER, MD;  [facility];  Service: Endoscopy;;  distal rectum (HS)   STONE EXTRACTION WITH BASKET Right 12/22/2019   Procedure: STONE EXTRACTION WITH BASKET;  Surgeon: Sherrilee Belvie CROME, MD;  [facility]: [facility];  Service: Urology;  Laterality: Right;   Past Surgical History:  Procedure Laterality Date   ABDOMINAL HYSTERECTOMY     Arm Surgery Bilateral    due to fall-pt broke both forearms   BIOPSY N/A 07/01/2013   Procedure: BIOPSY;  Surgeon: Claudis PENNER Golda, MD;  [facility]: [facility];  Service: Endoscopy;  Laterality: N/A;   CHOLECYSTECTOMY     COLONOSCOPY  08/06/2011   Procedure: COLONOSCOPY;  Surgeon: Claudis PENNER Golda, MD;  [facility];  Service: Endoscopy;  Laterality: N/A;  215   COLONOSCOPY WITH PROPOFOL  N/A 12/31/2012   Procedure: COLONOSCOPY WITH PROPOFOL ;  Surgeon: Claudis PENNER Golda, MD;  [facility]: [facility];  Service: Endoscopy;  Laterality: N/A;  in cecum at 0814, total withdrawal time   COLONOSCOPY WITH PROPOFOL  N/A 02/05/2018   Procedure: COLONOSCOPY WITH PROPOFOL ;  Surgeon: Golda Claudis PENNER, MD;  [facility];  Service: Endoscopy;  Laterality: N/A;  1:25   CORONARY ANGIOPLASTY WITH STENT PLACEMENT     CYSTOSCOPY W/ URETERAL STENT PLACEMENT Right 11/10/2019   at [facility]   CYSTOSCOPY WITH RETROGRADE PYELOGRAM, URETEROSCOPY AND STENT PLACEMENT Right 12/22/2019   Procedure: CYSTOSCOPY WITH RIGHT URETERAL STENT REMOVAL; RIGHT RETROGRADE PYELOGRAM,RIGHT URETEROSCOPY;  Surgeon: Sherrilee Belvie CROME, MD;  [facility]: [facility];   Service: Urology;  Laterality: Right;   ESOPHAGEAL DILATION N/A 06/07/2015   Procedure: ESOPHAGEAL DILATION;  Surgeon: Claudis PENNER Golda, MD;  [facility];  Service: Endoscopy;  Laterality: N/A;   ESOPHAGOGASTRODUODENOSCOPY N/A 06/07/2015   Procedure: ESOPHAGOGASTRODUODENOSCOPY (EGD);  Surgeon: Claudis RAYMOND Rivet, MD;  [facility];  Service: Endoscopy;  Laterality: N/A;  2:45 - moved to 1:00 - Ann to notify pt   ESOPHAGOGASTRODUODENOSCOPY (EGD) WITH PROPOFOL  N/A 12/31/2012   Procedure: ESOPHAGOGASTRODUODENOSCOPY (EGD) WITH PROPOFOL ;  Surgeon: Claudis RAYMOND Rivet, MD;  [facility]: [facility];  Service: Endoscopy;  Laterality: N/A;  GE junction 37,   ESOPHAGOGASTRODUODENOSCOPY (EGD) WITH PROPOFOL  N/A 07/01/2013   Procedure: ESOPHAGOGASTRODUODENOSCOPY (EGD) WITH PROPOFOL ;  Surgeon: Claudis RAYMOND Rivet, MD;  [facility]: [facility];  Service: Endoscopy;  Laterality: N/A;  950   MALONEY DILATION N/A 12/31/2012   Procedure: MALONEY DILATION;  Surgeon: Claudis RAYMOND Rivet, MD;  [facility]: [facility];  Service: Endoscopy;  Laterality: N/A;  used a # 54,#56   MALONEY DILATION N/A 07/01/2013   Procedure: MALONEY DILATION;  Surgeon: Claudis RAYMOND Rivet, MD;  [facility]: [facility];  Service: Endoscopy;  Laterality: N/A;  54/58; no heme present   POLYPECTOMY  02/05/2018   Procedure: POLYPECTOMY;  Surgeon: Rivet Claudis RAYMOND, MD;  [facility];  Service: Endoscopy;;  distal rectum (HS)   STONE EXTRACTION WITH BASKET Right 12/22/2019   Procedure: STONE EXTRACTION WITH BASKET;  Surgeon: Sherrilee Belvie CROME, MD;  Location: [facility];  Service: Urology;  Laterality: Right;   Past Medical History:  Diagnosis Date   Anxiety and depression    Arthritis    CAD (coronary artery disease)    Stent placement circumflex coronary 2007, catheterization 2008 patent stents. 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    High cholesterol    Hx of blood clots    Hypertension    MI (myocardial infarction) (HCC)    2007   Obstructive sleep apnea    CPAP, setting ?2   Palpitations    Sleep apnea    Tachycardia    BP (!) 154/93   Pulse 85   Ht 5' 4 (1.626 m)   Wt 249 lb 6.4 oz (113.1 kg)   SpO2 95%   BMI 42.81 kg/m   Opioid Risk Score:   Fall Risk Score:  `1  Depression screen [facility] 2/9     03/09/2024    2:38 PM 01/06/2024    9:51 AM 10/07/2023    1:29 PM 09/10/2023   12:57 PM 08/12/2023    3:07 PM 07/14/2023   12:55 PM 06/12/2023   10:51 AM  Depression screen PHQ 2/9  Decreased Interest 1 1 3 1 3 1 1   Down, Depressed, Hopeless 1 1 3 1 3 1 1   PHQ - 2 Score 2 2 6 2 6 2 2   Altered sleeping   3  3  2   Tired, decreased energy   3  3  2   Change in appetite   2  0  1  Feeling bad or failure about yourself    2  0  0  Trouble concentrating   2  0  0  Moving slowly or  fidgety/restless   0  0  0  Suicidal thoughts   0  0  0  PHQ-9 Score   18   12   7    Difficult doing work/chores   Extremely dIfficult  Not difficult at all       Data saved with a previous flowsheet row definition     Review of Systems  Musculoskeletal:        Bil knees and hands  All other systems reviewed and are negative.      Objective:   Physical Exam Vitals and nursing note reviewed.  Constitutional:      Appearance: Normal appearance.  Cardiovascular:     Rate and Rhythm: Normal rate and regular rhythm.     Pulses: Normal pulses.     Heart sounds: Normal heart sounds.  Pulmonary:     Effort: Pulmonary effort is normal.     Breath sounds: Normal breath sounds.  Musculoskeletal:     Comments: Normal Muscle Bulk and Muscle Testing Reveals:  Upper Extremities:Full  ROM and  Muscle Strength 5/5  Thoracic Paraspinal Tenderness: T-1-T-4 Lumbar Paraspinal Tenderness: L-3-L-5 Bilateral Greater Trochanter Tenderness Lower Extremities: Full ROM and Muscle Strength 5/5 Arises from Table slowly using walker for support Antalgic  Gait     Skin:    General: Skin is warm and dry.  Neurological:     Mental Status: She is alert and oriented to person, place, and time.  Psychiatric:        Mood and Affect: Mood normal.        Behavior: Behavior normal.          Assessment & Plan:  1.Lumbar pain lumbar spondylosis:  Encouraged to continue to increase Activity as tolerated. 03/09/2024 Continue current medication regimen. Refilled Oxycodone  10/325 mg one tablet every 6 hours as needed for pain. #120.  We will continue the opioid monitoring program, this consists of regular clinic visits, examinations, urine drug screen, pill counts as well as use of [place]  Controlled Substance Reporting system. A 12 month History has been reviewed on the [place]  Controlled Substance Reporting System on 03/09/2024.  2. Lumbar Radiculitis: Continue current medication regimen with Gabapentin . 03/09/2024 3. Bilateral Knee Pain/ Degenerative: Continue Voltaren  Gel. Continue to Monitor. 03/09/2024 4. Severe diabetic poly neuropathy: Continue current medication regimen with Gabapentin . 12/ 24/2025 5. Insomnia: No complaints today. Continue to Monitor.  03/09/2024 6.Anxiety/Depression: Continue Current Medication regimen as prescribed by PCP. SABRA 03/09/2024 7. De Quervains Tenosynovitis: No Complaints Today. 12/24//2025 8. Bilateral  Greater Trochanteric Bursitis: Continue with Ice and Heat Therapy. Continue to Monitor. 03/09/2024.  9. Cervicalgia/ Cervical Radiculitis: Continue Gabapentin . Continue with HEP as Tolerated and continue to monitor. 03/09/2024 10. Chronic Bilateral Thoracic Back Pain:  Continue HEP as Tolerated. Continue current medication regimen. Continue to monitor.  03/09/2024.  11. SOB with Exertion: She refuses ED or Urgent Care evaluation: No Distress noted. She was instructed to call EMS if she develops SOB, she verbalizes understanding.  F/U in 1 month     "

## 2024-03-24 ENCOUNTER — Other Ambulatory Visit: Payer: Self-pay | Admitting: Cardiology

## 2024-04-01 ENCOUNTER — Encounter: Payer: Self-pay | Admitting: Cardiology

## 2024-04-01 ENCOUNTER — Encounter: Payer: Self-pay | Admitting: *Deleted

## 2024-04-01 ENCOUNTER — Ambulatory Visit: Attending: Cardiology | Admitting: Cardiology

## 2024-04-01 NOTE — Progress Notes (Unsigned)
 "     Clinical Summary [name] is a 74 y.o.female  seen today for follow up of the following medical problems.   1. CAD -history of MI in 2007, had PCI to LCX  08/2020 nuclear stress no ischemia  07/2020 echo: LVEF 55-60%, no WMAs, grade I dd, normal RV   -chronic chest pains - pain midchest, pressure like pain. Can occur at rest or with activity. Not positional. Lasts 20-30 minutes. Can be better with prevacid but also NG. 2-3 epsidoes per week. -    2. Chronic diastolic HF - increased swelling - compliant with lasix  40mg  daily   3. OSA  - was to establish with pulmionary   4. Afib - 10/2019 admitted to [facility] - UTI with sepsis, had some afib during that admission. No prior history.  - was started on eliquis at the time but Rx ran out, no longer taking.   04/2020 30 day monitor: no arrhythmias Has not been committed to long term anticoag     5. HTN - compliant with meds Past Medical History:  Diagnosis Date   Anxiety and depression    Arthritis    CAD (coronary artery disease)    Stent placement circumflex coronary 2007, catheterization 2008 patent stents. 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     acetaminophen  (TYLENOL ) 325 MG tablet Take 2 tablets (650  mg total) by mouth every 4 (four) hours as needed for mild pain (or temp > 37.5 C (99.5 F)). 100 tablet 4   ALPRAZolam  (XANAX ) 0.5 MG tablet Take 1 tablet (0.5 mg total) by mouth 2 (two) times daily as needed for anxiety or sleep. 12 tablet 0   aspirin  EC 81 MG tablet Take 1 tablet (81 mg total) by mouth daily with breakfast. -Take Aspirin  81 mg daily along with Plavix  75 mg daily for 21 days then after that STOP the Plavix   and continue ONLY Aspirin  81 mg daily indefinitely-- 30 tablet 11   atorvastatin  (LIPITOR) 40 MG tablet Take 40 mg by mouth daily.     B-D ULTRAFINE III SHORT PEN 31G X 8 MM MISC Inject into the skin 4 (four) times daily.     Blood Glucose Monitoring Suppl (ACCU-CHEK GUIDE ME) w/Device KIT 1 Piece by Does not apply route as directed. 1 kit 0   buPROPion  ER (WELLBUTRIN  SR) 100 MG 12 hr tablet Take 1 tablet (100 mg total) by mouth daily. 30 tablet 2   Cholecalciferol  (VITAMIN D3) 125 MCG (5000 UT) CAPS TAKE ONE CAPSULE BY MOUTH DAILY (Patient taking differently: Take 5,000 Units by  mouth daily.) 90 capsule 0   diclofenac  Sodium (VOLTAREN ) 1 % GEL Apply topically.     doxepin  (SINEQUAN ) 25 MG capsule Take 75 mg by mouth at bedtime.      FEROSUL 325 (65 Fe) MG tablet Take 325 mg by mouth every morning.     ferrous sulfate  325 (65 FE) MG EC tablet Take by mouth.     furosemide  (LASIX ) 40 MG tablet Take 1 tablet (40 mg total) by mouth daily with supper for 3 days. 30 tablet 0   furosemide  (LASIX ) 40 MG tablet TAKE 1 AND 1/2 TABLETS BY MOUTH DAILY 45 tablet 1   gabapentin  (NEURONTIN ) 300 MG capsule Take 1 capsule (300 mg total) by mouth 3 (three) times daily. 90 capsule 0   glipiZIDE  (GLUCOTROL  XL) 5 MG 24 hr tablet Take 5 mg by mouth daily.     glucose blood (ACCU-CHEK GUIDE) test strip USE AS DIRECTED FOUR TIMES DAILY 150 strip 2   Insulin  Lispro Prot & Lispro (HUMALOG  75/25 MIX) (75-25) 100 UNIT/ML Kwikpen Inject 90 Units into the skin 2 (two) times daily with a meal. 135 mL 2    isosorbide  mononitrate (IMDUR ) 30 MG 24 hr tablet TAKE 1/2 TABLET BY MOUTH DAILY (Need TO make APPOINTMENT FOR NEXT refills) (Patient taking differently: Take 30 mg by mouth daily. Take 1/2 tablet daily) 3 tablet 0   lidocaine  (XYLOCAINE ) 5 % ointment Apply topically 2 (two) times daily.     lisinopril  (ZESTRIL ) 20 MG tablet TAKE 1 TABLET BY MOUTH EVERY DAY 90 tablet 3   meloxicam  (MOBIC ) 7.5 MG tablet Take 7.5 mg by mouth daily.     metFORMIN  (GLUCOPHAGE ) 1000 MG tablet Take 1 tablet (1,000 mg total) by mouth 2 (two) times daily with a meal. 60 tablet 3   methocarbamol  (ROBAXIN ) 500 MG tablet Take 500 mg by mouth 2 (two) times daily.     metoCLOPramide  (REGLAN ) 5 MG tablet Take 5 mg by mouth 3 (three) times daily as needed for nausea or vomiting.   1   metoprolol  succinate (TOPROL -XL) 100 MG 24 hr tablet Take 100 mg by mouth daily. Take with or immediately following a meal.     montelukast  (SINGULAIR ) 10 MG tablet Take 10 mg by mouth at bedtime.      Multiple Vitamin (MULTIVITAMIN) capsule Take 1 capsule by mouth daily.     naloxone (NARCAN) nasal spray 4 mg/0.1 mL SMARTSIG:Both Nares     nitroGLYCERIN  (NITROSTAT ) 0.4 MG SL tablet DISSOLVE 1 TABLET UNDER THE TONGUE EVERY 5 MINUTES AS NEEDED FOR CHEST PAIN. DO NOT EXCEED A TOTAL OF 3 DOSES IN 15 MINUTES. 25 tablet 3   ondansetron  (ZOFRAN ) 4 MG tablet Take 4 mg by mouth every other day.     oxyCODONE -acetaminophen  (PERCOCET) 10-325 MG tablet Take 1 tablet by mouth every 6 (six) hours as needed for pain. 120 tablet 0   pantoprazole  (PROTONIX ) 40 MG tablet TAKE 1 TABLET BY MOUTH TWICE DAILY BEFORE MEALS (Patient taking differently: Take 40 mg by mouth 2 (two) times daily before a meal.) 60 tablet 1   polyethylene glycol (MIRALAX  / GLYCOLAX ) 17 g packet Take 17 g by mouth daily as needed for moderate constipation.     potassium chloride  (KLOR-CON ) 10 MEQ tablet Take 10 mEq by mouth 2 (two) times daily.     PROAIR  HFA 108 (90 Base) MCG/ACT inhaler Inhale  2 puffs into the lungs every 4 (four) hours as needed for wheezing or shortness of breath.  8   promethazine  (PHENERGAN ) 25 MG tablet Take 25 mg by mouth 4 (four) times daily as needed.     senna-docusate (SENOKOT-S) 8.6-50 MG tablet Take 2 tablets by mouth at bedtime. 60 tablet 2   SSD 1 % cream Apply topically 2 (two) times daily.     SURE COMFORT INS SYR 1CC/30G 30G X 5/16 1 ML MISC 1 each by Other route daily.     topiramate  (TOPAMAX ) 50 MG tablet TAKE 1 TABLET BY MOUTH TWICE DAILY 60 tablet 3   No current facility-administered medications for this visit.     Past Surgical History:  Procedure Laterality Date   ABDOMINAL HYSTERECTOMY     Arm Surgery Bilateral    due to fall-pt broke both forearms   BIOPSY N/A 07/01/2013   Procedure: BIOPSY;  Surgeon: Claudis RAYMOND Rivet, MD;  Location: [facility];  Service: Endoscopy;  Laterality: N/A;   CHOLECYSTECTOMY     COLONOSCOPY  08/06/2011   Procedure: COLONOSCOPY;  Surgeon: Claudis RAYMOND Rivet, MD;  Location: [facility];  Service: Endoscopy;  Laterality: N/A;  215   COLONOSCOPY WITH PROPOFOL  N/A 12/31/2012   Procedure: COLONOSCOPY WITH PROPOFOL ;  Surgeon: Claudis RAYMOND Rivet, MD;  Location: [facility];  Service: Endoscopy;  Laterality: N/A;  in cecum at 0814, total withdrawal time   COLONOSCOPY WITH PROPOFOL  N/A 02/05/2018   Procedure: COLONOSCOPY WITH PROPOFOL ;  Surgeon: Rivet Claudis RAYMOND, MD;  Location: [facility];  Service: Endoscopy;  Laterality: N/A;  1:25   CORONARY ANGIOPLASTY WITH STENT PLACEMENT     CYSTOSCOPY W/ URETERAL STENT PLACEMENT Right 11/10/2019   at [facility]   CYSTOSCOPY WITH RETROGRADE PYELOGRAM, URETEROSCOPY AND STENT PLACEMENT Right 12/22/2019   Procedure: CYSTOSCOPY WITH RIGHT URETERAL STENT REMOVAL; RIGHT RETROGRADE PYELOGRAM,RIGHT URETEROSCOPY;  Surgeon: Sherrilee Belvie CROME, MD;  Location: [facility];  Service: Urology;  Laterality: Right;   ESOPHAGEAL DILATION N/A 06/07/2015   Procedure: ESOPHAGEAL DILATION;  Surgeon: Claudis RAYMOND Rivet, MD;  [facility]: [facility];  Service: Endoscopy;  Laterality: N/A;   ESOPHAGOGASTRODUODENOSCOPY N/A 06/07/2015   Procedure: ESOPHAGOGASTRODUODENOSCOPY (EGD);  Surgeon: Claudis RAYMOND Rivet, MD;  [facility]: [facility];  Service: Endoscopy;  Laterality: N/A;  2:45 - moved to 1:00 - Ann to notify pt   ESOPHAGOGASTRODUODENOSCOPY (EGD) WITH PROPOFOL  N/A 12/31/2012   Procedure: ESOPHAGOGASTRODUODENOSCOPY (EGD) WITH PROPOFOL ;  Surgeon: Claudis RAYMOND Rivet, MD;  Location: [facility];  Service: Endoscopy;  Laterality: N/A;  GE junction 37,   ESOPHAGOGASTRODUODENOSCOPY (EGD) WITH PROPOFOL  N/A 07/01/2013   Procedure: ESOPHAGOGASTRODUODENOSCOPY (EGD) WITH PROPOFOL ;  Surgeon: Claudis RAYMOND Rivet, MD;  Location: [facility];  Service: Endoscopy;  Laterality: N/A;  950   MALONEY DILATION N/A 12/31/2012   Procedure: MALONEY DILATION;  Surgeon: Claudis RAYMOND Rivet, MD;  Location: [facility];  Service: Endoscopy;  Laterality: N/A;  used a # 54,#56   MALONEY DILATION N/A 07/01/2013   Procedure: MALONEY DILATION;  Surgeon: Claudis RAYMOND Rivet, MD;  Location: [facility];  Service: Endoscopy;  Laterality: N/A;  54/58; no heme present   POLYPECTOMY  02/05/2018   Procedure: POLYPECTOMY;  Surgeon: Rivet Claudis RAYMOND, MD;  Location: [facility];  Service: Endoscopy;;  distal rectum (HS)   STONE EXTRACTION WITH BASKET Right 12/22/2019   Procedure: STONE EXTRACTION WITH BASKET;  Surgeon: Sherrilee Belvie CROME, MD;  Location: [facility];  Service: Urology;  Laterality: Right;     Allergies[2]    Family History  Problem Relation Age of Onset   Heart attack Mother  Colon cancer Mother    Heart attack Father    Breast cancer Sister    Stroke Sister    Cancer Maternal Aunt    Cancer Maternal Uncle    Cancer Paternal Aunt    Cancer Paternal Uncle    Cancer Brother    Cancer Brother    Hemophilia Child    Cancer Other    Coronary artery disease Other      Social History [name] reports that she has never smoked. She has never used  smokeless tobacco. [name] reports no history of alcohol  use.   Review of Systems CONSTITUTIONAL: No weight loss, fever, chills, weakness or fatigue.  HEENT: Eyes: No visual loss, blurred vision, double vision or yellow sclerae.No hearing loss, sneezing, congestion, runny nose or sore throat.  SKIN: No rash or itching.  CARDIOVASCULAR:  RESPIRATORY: No shortness of breath, cough or sputum.  GASTROINTESTINAL: No anorexia, nausea, vomiting or diarrhea. No abdominal pain or blood.  GENITOURINARY: No burning on urination, no polyuria NEUROLOGICAL: No headache, dizziness, syncope, paralysis, ataxia, numbness or tingling in the extremities. No change in bowel or bladder control.  MUSCULOSKELETAL: No muscle, back pain, joint pain or stiffness.  LYMPHATICS: No enlarged nodes. No history of splenectomy.  PSYCHIATRIC: No history of depression or anxiety.  ENDOCRINOLOGIC: No reports of sweating, cold or heat intolerance. No polyuria or polydipsia.  [name]   Physical Examination There were no vitals filed for this visit. There were no vitals filed for this visit.  Gen: resting comfortably, no acute distress HEENT: no scleral icterus, pupils equal round and reactive, no palptable cervical adenopathy,  CV Resp: Clear to auscultation bilaterally GI: abdomen is soft, non-tender, non-distended, normal bowel sounds, no hepatosplenomegaly MSK: extremities are warm, no edema.  Skin: warm, no rash Neuro:  no focal deficits Psych: appropriate affect   Diagnostic Studies Echocardiogram 04/05/2019 [facility] [place]):  Summary   1. The left ventricle is normal in size with mildly increased wall thickness.   2. The left ventricular systolic function is normal, LVEF is visually estimated at > 55%. Grade 2 diastolic dysfunction.   3. The left atrium is mildly dilated in size.   4. The right ventricle is mildly dilated in size, with normal systolic function.   5. The right atrium is mildly dilated  in  size.     08/2016 nuclear stress There was no ST segment deviation noted during stress. The study is normal. This is a low risk study. Nuclear stress EF: 68%.     04/2020 30 day monitor - no arrhytnmias     08/2020 nuclear stress No diagnostic ST segment changes to indicate ischemia. Very small, mild intensity, apical anteroseptal defect that is reversible and consistent with either variable breast attenuation or small ischemic territory. This is an intermediate risk study based on calculated ejection fraction, otherwise low risk in terms of perfusion imaging. Suggest echocardiogram for corroboration. Nuclear stress EF: 45%. There was no ST segment deviation noted during stress.    Assessment and Plan   1. CAD  - chronic chest pains, recent nuclear stress without significant ischemia - pain can improve with antacid but also NG - trial of imdur  15mg  daily, perhaps vasospasm or microvasc disease.  - EKG shows SR, no acute ischemic changes   2. Acute on chronic diastolic HF - volume up, increase lasix  to 60mg  daily. Check bmet/mg/bnp in 2 weeks   3. HTN - elevated bps, monitor with additional diuresis for now  Dorn PHEBE Ross, M.D., F.A.C.C.    [1]  Allergies Allergen Reactions   Amphetamine-Dextroamphetamine Swelling   Nitrofuran Derivatives Itching and Swelling   Amphetamine-Dextroamphet Er Swelling   Dextroamphetamine    Meperidine  Hcl    Pregabalin Swelling   Topiramate  Other (See Comments)    Tongue tingle   Verelan [Verapamil] Rash  [2]  Allergies Allergen Reactions   Amphetamine-Dextroamphetamine Swelling   Nitrofuran Derivatives Itching and Swelling   Amphetamine-Dextroamphet Er Swelling   Dextroamphetamine    Meperidine  Hcl    Pregabalin Swelling   Topiramate  Other (See Comments)    Tongue tingle   Verelan [Verapamil] Rash   "

## 2024-04-06 ENCOUNTER — Encounter: Attending: Registered Nurse | Admitting: Registered Nurse

## 2024-04-06 ENCOUNTER — Encounter: Payer: Self-pay | Admitting: Registered Nurse

## 2024-04-06 VITALS — BP 120/71 | HR 72 | Ht 64.0 in | Wt 253.0 lb

## 2024-04-06 DIAGNOSIS — M546 Pain in thoracic spine: Secondary | ICD-10-CM | POA: Insufficient documentation

## 2024-04-06 DIAGNOSIS — M1712 Unilateral primary osteoarthritis, left knee: Secondary | ICD-10-CM | POA: Insufficient documentation

## 2024-04-06 DIAGNOSIS — M542 Cervicalgia: Secondary | ICD-10-CM | POA: Diagnosis not present

## 2024-04-06 DIAGNOSIS — Z79891 Long term (current) use of opiate analgesic: Secondary | ICD-10-CM | POA: Insufficient documentation

## 2024-04-06 DIAGNOSIS — M5412 Radiculopathy, cervical region: Secondary | ICD-10-CM | POA: Diagnosis not present

## 2024-04-06 DIAGNOSIS — G894 Chronic pain syndrome: Secondary | ICD-10-CM | POA: Insufficient documentation

## 2024-04-06 DIAGNOSIS — G8929 Other chronic pain: Secondary | ICD-10-CM | POA: Diagnosis present

## 2024-04-06 DIAGNOSIS — M5416 Radiculopathy, lumbar region: Secondary | ICD-10-CM | POA: Insufficient documentation

## 2024-04-06 DIAGNOSIS — M1711 Unilateral primary osteoarthritis, right knee: Secondary | ICD-10-CM | POA: Insufficient documentation

## 2024-04-06 DIAGNOSIS — M25512 Pain in left shoulder: Secondary | ICD-10-CM | POA: Insufficient documentation

## 2024-04-06 DIAGNOSIS — Z5181 Encounter for therapeutic drug level monitoring: Secondary | ICD-10-CM | POA: Diagnosis not present

## 2024-04-06 DIAGNOSIS — M25511 Pain in right shoulder: Secondary | ICD-10-CM | POA: Insufficient documentation

## 2024-04-06 MED ORDER — OXYCODONE-ACETAMINOPHEN 10-325 MG PO TABS: 1.0000 | ORAL_TABLET | Freq: Four times a day (QID) | ORAL | 0 refills | Status: AC | PRN

## 2024-04-06 NOTE — Progress Notes (Signed)
 "  Subjective:    Patient ID: [name], female    DOB: November 24, 1950, 74 y.o.   MRN: [id number]  HPI: [name] is a 74 y.o. female who returns for follow up appointment for chronic pain and medication refill. She states her pain is located in her neck radiating into her bilateral shoulders, upper- lower back radiating into her bilateral hips and bilateral lower extremities.  She also reports bilateral feet with tingling and burning, She rates her pain 5. Her current exercise regime is walking and performing stretching exercises.  [name] Morphine  equivalent is 60.00 MME.She  is also prescribed Alprazolam   by Dr. Rosamond .We have discussed the black box warning of using opioids and benzodiazepines. I highlighted the dangers of using these drugs together and discussed the adverse events including respiratory suppression, overdose, cognitive impairment and importance of compliance with current regimen. We will continue to monitor and adjust as indicated.   Last Oral Swab was Performed on 01/06/2024, it was consistent.     Pain Inventory Average Pain 6 Pain Right Now 5 My pain is sharp, burning, dull, stabbing, tingling, and aching  In the last 24 hours, has pain interfered with the following? General activity 5 Relation with others 5 Enjoyment of life 5 What TIME of day is your pain at its worst? morning , evening, and night Sleep (in general) Fair  Pain is worse with: walking, bending, standing, and some activites Pain improves with: medication Relief from Meds: 2  Family History  Problem Relation Age of Onset   Heart attack Mother    Colon cancer Mother    Heart attack Father    Breast cancer Sister    Stroke Sister    Cancer Maternal Aunt    Cancer Maternal Uncle    Cancer Paternal Aunt    Cancer Paternal Uncle    Cancer Brother    Cancer Brother    Hemophilia Child    Cancer Other    Coronary artery disease Other    Social History   Socioeconomic History    Marital status: Divorced    Spouse name: Not on file   Number of children: 4   Years of education: Not on file   Highest education level: Not on file  Occupational History   Occupation: Disabled  Tobacco Use   Smoking status: Never   Smokeless tobacco: Never  Vaping Use   Vaping status: Never Used  Substance and Sexual Activity   Alcohol  use: No    Alcohol /week: 0.0 standard drinks of alcohol    Drug use: No   Sexual activity: Not on file  Other Topics Concern   Not on file  Social History Narrative   Patient does not get regular exercise   Denies caffeine use    Social Drivers of Health   Tobacco Use: Low Risk (03/09/2024)   Patient History    Smoking Tobacco Use: Never    Smokeless Tobacco Use: Never    Passive Exposure: Not on file  Financial Resource Strain: Low Risk (09/26/2023)   Received from [facility]   Overall Financial Resource Strain (CARDIA)    How hard is it for you to pay for the very basics like food, housing, medical care, and heating?: Not hard at all  Food Insecurity: No Food Insecurity (09/26/2023)   Received from [facility]   Epic    Within the past 12 months, you worried that your food would run out before you got the  money to buy more.: Never true    Within the past 12 months, the food you bought just didn't last and you didn't have money to get more.: Never true  Transportation Needs: No Transportation Needs (09/26/2023)   Received from [facility]   PRAPARE - Transportation    Lack of Transportation (Medical): No    Lack of Transportation (Non-Medical): No  Physical Activity: Insufficiently Active (09/26/2023)   Received from [facility]   Exercise Vital Sign    On average, how many days per week do you engage in moderate to strenuous exercise (like a brisk walk)?: 7 days    On average, how many minutes do you engage in exercise at this level?: 10 min  Stress: No Stress Concern Present (09/26/2023)   Received from [facility] - Occupational Stress Questionnaire    Do you feel stress - tense, restless, nervous, or anxious, or unable to sleep at night because your mind is troubled all the time - these days?: Not at all  Social Connections: Moderately Integrated (09/26/2023)   Received from [facility]   Social Connection and Isolation Panel    In a typical week, how many times do you talk on the phone with family, friends, or neighbors?: More than three times a week    How often do you get together with friends or relatives?: More than three times a week    How often do you attend church or religious services?: More than 4 times per year    Do you belong to any clubs or organizations such as church groups, unions, fraternal or athletic groups, or school groups?: No    How often do you attend meetings of the clubs or organizations you belong to?: Never    Are you married, widowed, divorced, separated, never married, or living with a partner?: Living with partner  Depression (PHQ2-9): Low Risk (03/09/2024)   Depression (PHQ2-9)    PHQ-2 Score: 2  Alcohol  Screen: Not on file  Housing: Low Risk (07/21/2022)   Housing    Last Housing Risk Score: 0  Utilities: Low Risk (09/26/2023)   Received from [facility]   Utilities    Within the past 12 months, have you been unable to get utilities(heat, electricity) when it was really needed?: No  Health Literacy: Low Risk (09/26/2023)   Received from [facility] Literacy    How often do you need to have someone help you when you read instructions, pamphlets, or other written material from your doctor or pharmacy?: Never   Past Surgical History:  Procedure Laterality Date   ABDOMINAL HYSTERECTOMY     Arm Surgery Bilateral    due to fall-pt broke both forearms   BIOPSY N/A 07/01/2013   Procedure: BIOPSY;  Surgeon: Claudis RAYMOND Rivet, MD;  [facility]: [facility];  Service: Endoscopy;  Laterality: N/A;   CHOLECYSTECTOMY      COLONOSCOPY  08/06/2011   Procedure: COLONOSCOPY;  Surgeon: Claudis RAYMOND Rivet, MD;  [facility];  Service: Endoscopy;  Laterality: N/A;  215   COLONOSCOPY WITH PROPOFOL  N/A 12/31/2012   Procedure: COLONOSCOPY WITH PROPOFOL ;  Surgeon: Claudis RAYMOND Rivet, MD;  Location: [facility];  Service: Endoscopy;  Laterality: N/A;  in cecum at 0814, total withdrawal time   COLONOSCOPY WITH PROPOFOL  N/A 02/05/2018   Procedure: COLONOSCOPY WITH PROPOFOL ;  Surgeon: Rivet Claudis RAYMOND, MD;  [facility];  Service: Endoscopy;  Laterality: N/A;  1:25   CORONARY ANGIOPLASTY WITH STENT PLACEMENT     CYSTOSCOPY W/ URETERAL STENT PLACEMENT Right 11/10/2019   at [facility]   CYSTOSCOPY WITH RETROGRADE PYELOGRAM, URETEROSCOPY AND STENT PLACEMENT Right 12/22/2019   Procedure: CYSTOSCOPY WITH RIGHT URETERAL STENT REMOVAL; RIGHT RETROGRADE PYELOGRAM,RIGHT URETEROSCOPY;  Surgeon: Sherrilee Belvie CROME, MD;  Location: [facility];  Service: Urology;  Laterality: Right;   ESOPHAGEAL DILATION N/A 06/07/2015   Procedure: ESOPHAGEAL DILATION;  Surgeon: Claudis RAYMOND Rivet, MD;  [facility];  Service: Endoscopy;  Laterality: N/A;   ESOPHAGOGASTRODUODENOSCOPY N/A 06/07/2015   Procedure: ESOPHAGOGASTRODUODENOSCOPY (EGD);  Surgeon: Claudis RAYMOND Rivet, MD;  [facility];  Service: Endoscopy;  Laterality: N/A;  2:45 - moved to 1:00 - Ann to notify pt   ESOPHAGOGASTRODUODENOSCOPY (EGD) WITH PROPOFOL  N/A 12/31/2012   Procedure: ESOPHAGOGASTRODUODENOSCOPY (EGD) WITH PROPOFOL ;  Surgeon: Claudis RAYMOND Rivet, MD;  [facility]: [facility];  Service: Endoscopy;  Laterality: N/A;  GE junction 37,   ESOPHAGOGASTRODUODENOSCOPY (EGD) WITH PROPOFOL  N/A 07/01/2013   Procedure: ESOPHAGOGASTRODUODENOSCOPY (EGD) WITH PROPOFOL ;  Surgeon: Claudis RAYMOND Rivet, MD;  [facility]: [facility];  Service: Endoscopy;  Laterality: N/A;  950   MALONEY DILATION N/A 12/31/2012   Procedure: MALONEY DILATION;  Surgeon: Claudis RAYMOND Rivet, MD;  [facility]: [facility];  Service:  Endoscopy;  Laterality: N/A;  used a # 54,#56   MALONEY DILATION N/A 07/01/2013   Procedure: MALONEY DILATION;  Surgeon: Claudis RAYMOND Rivet, MD;  [facility]: [facility];  Service: Endoscopy;  Laterality: N/A;  54/58; no heme present   POLYPECTOMY  02/05/2018   Procedure: POLYPECTOMY;  Surgeon: Rivet Claudis RAYMOND, MD;  [facility];  Service: Endoscopy;;  distal rectum (HS)   STONE EXTRACTION WITH BASKET Right 12/22/2019   Procedure: STONE EXTRACTION WITH BASKET;  Surgeon: Sherrilee Belvie CROME, MD;  [facility]: [facility];  Service: Urology;  Laterality: Right;   Past Surgical History:  Procedure Laterality Date   ABDOMINAL HYSTERECTOMY     Arm Surgery Bilateral    due to fall-pt broke both forearms   BIOPSY N/A 07/01/2013   Procedure: BIOPSY;  Surgeon: Claudis RAYMOND Rivet, MD;  [facility]: [facility];  Service: Endoscopy;  Laterality: N/A;   CHOLECYSTECTOMY     COLONOSCOPY  08/06/2011   Procedure: COLONOSCOPY;  Surgeon: Claudis RAYMOND Rivet, MD;  [facility];  Service: Endoscopy;  Laterality: N/A;  215   COLONOSCOPY WITH PROPOFOL  N/A 12/31/2012   Procedure: COLONOSCOPY WITH PROPOFOL ;  Surgeon: Claudis RAYMOND Rivet, MD;  [facility]: [facility];  Service: Endoscopy;  Laterality: N/A;  in cecum at 0814, total withdrawal time   COLONOSCOPY WITH PROPOFOL  N/A 02/05/2018   Procedure: COLONOSCOPY WITH PROPOFOL ;  Surgeon: Rivet Claudis RAYMOND, MD;  [facility];  Service: Endoscopy;  Laterality: N/A;  1:25   CORONARY ANGIOPLASTY WITH STENT PLACEMENT     CYSTOSCOPY W/ URETERAL STENT PLACEMENT Right 11/10/2019   at [facility]   CYSTOSCOPY WITH RETROGRADE PYELOGRAM, URETEROSCOPY AND STENT PLACEMENT Right 12/22/2019   Procedure: CYSTOSCOPY WITH RIGHT URETERAL STENT REMOVAL; RIGHT RETROGRADE PYELOGRAM,RIGHT URETEROSCOPY;  Surgeon: Sherrilee Belvie CROME, MD;  [facility]: [facility];  Service: Urology;  Laterality: Right;   ESOPHAGEAL DILATION N/A 06/07/2015   Procedure: ESOPHAGEAL DILATION;  Surgeon: Claudis RAYMOND Rivet, MD;   [facility];  Service: Endoscopy;  Laterality: N/A;   ESOPHAGOGASTRODUODENOSCOPY N/A 06/07/2015   Procedure: ESOPHAGOGASTRODUODENOSCOPY (EGD);  Surgeon: Claudis RAYMOND Rivet, MD;  [facility];  Service: Endoscopy;  Laterality: N/A;  2:45 - moved to 1:00 - Ann to notify pt   ESOPHAGOGASTRODUODENOSCOPY (EGD) WITH PROPOFOL  N/A 12/31/2012   Procedure: ESOPHAGOGASTRODUODENOSCOPY (EGD) WITH PROPOFOL ;  Surgeon: Claudis RAYMOND Rivet, MD;  [facility]: [facility];  Service: Endoscopy;  Laterality: N/A;  GE junction 37,   ESOPHAGOGASTRODUODENOSCOPY (EGD) WITH PROPOFOL  N/A 07/01/2013   Procedure: ESOPHAGOGASTRODUODENOSCOPY (EGD) WITH PROPOFOL ;  Surgeon: Claudis RAYMOND Rivet, MD;  [facility]: [facility];  Service: Endoscopy;  Laterality: N/A;  950   MALONEY DILATION N/A 12/31/2012   Procedure: MALONEY DILATION;  Surgeon: Claudis RAYMOND Rivet, MD;  [facility]: [facility];  Service: Endoscopy;  Laterality: N/A;  used a # 54,#56   MALONEY DILATION N/A 07/01/2013   Procedure: MALONEY DILATION;  Surgeon: Claudis RAYMOND Rivet, MD;  [facility]: [facility];  Service: Endoscopy;  Laterality: N/A;  54/58; no heme present   POLYPECTOMY  02/05/2018   Procedure: POLYPECTOMY;  Surgeon: Rivet Claudis RAYMOND, MD;  [facility];  Service: Endoscopy;;  distal rectum (HS)   STONE EXTRACTION WITH BASKET Right 12/22/2019   Procedure: STONE EXTRACTION WITH BASKET;  Surgeon: Sherrilee Belvie CROME, MD;  Location: [facility];  Service: Urology;  Laterality: Right;   Past Medical History:  Diagnosis Date   Anxiety and depression    Arthritis    CAD (coronary artery disease)    Stent placement circumflex coronary 2007, catheterization 2008 patent stents. 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    High cholesterol    Hx of blood clots    Hypertension    MI (myocardial infarction) (HCC)    2007   Obstructive sleep apnea    CPAP, setting ?2   Palpitations    Sleep apnea    Tachycardia    BP 120/71   Pulse 72   Ht 5' 4 (1.626 m)   Wt 253 lb (114.8 kg)   SpO2 97%   BMI 43.43 kg/m   Opioid Risk Score:   Fall Risk Score:  `1  Depression screen [facility] 2/9     03/09/2024    2:38 PM 01/06/2024    9:51 AM 10/07/2023    1:29 PM 09/10/2023   12:57 PM 08/12/2023    3:07 PM 07/14/2023   12:55 PM 06/12/2023   10:51 AM  Depression screen PHQ 2/9  Decreased Interest 1 1 3 1 3 1 1   Down, Depressed, Hopeless 1 1 3 1 3 1 1   PHQ - 2 Score 2 2 6 2 6 2 2   Altered sleeping   3  3  2   Tired, decreased energy   3  3  2   Change in appetite   2  0  1  Feeling bad or failure about yourself    2  0  0  Trouble concentrating   2  0  0  Moving slowly or fidgety/restless   0  0  0  Suicidal thoughts   0  0  0  PHQ-9 Score   18   12   7    Difficult  doing work/chores   Extremely dIfficult  Not difficult at all       Data saved with a previous flowsheet row definition     Review of Systems  Musculoskeletal:  Positive for back pain, gait problem and myalgias.  All other systems reviewed and are negative.      Objective:   Physical Exam Vitals and nursing note reviewed.  Constitutional:      Appearance: Normal appearance. She is obese.  Neck:     Comments: Cervical Paraspinal Tenderness: C-5-C-6 Cardiovascular:     Rate and Rhythm: Normal rate and regular rhythm.     Pulses: Normal pulses.     Heart sounds: Normal heart sounds.  Pulmonary:     Effort: Pulmonary effort is normal.     Breath sounds: Normal breath sounds.  Musculoskeletal:     Comments: Normal Muscle Bulk and Muscle Testing Reveals:  Upper Extremities: Full ROM and Muscle Strength 5/5 Bilateral AC Joint Tenderness   Lumbar Paraspinal Tenderness: L-3-L-5 Bilateral Greater Trochanter Tenderness Lower Extremities: Decreased ROM and Muscle Strength 5/5 Bilateral Lower Extremities Flexion Produce Pain into her Lumbar, Bilateral Hips, Bilateral Lower extremities and Bilateral Patella's  Arises from Chair slowly using walker for support Antalgic  Gait     Skin:    General: Skin is warm and dry.  Neurological:     Mental Status: She is alert and oriented to person, place, and time.  Psychiatric:        Mood and Affect: Mood normal.        Behavior: Behavior normal.           Assessment & Plan:  1.Chronic Bilateral Thoracic Back Pain/ Lumbar pain lumbar spondylosis:  Encouraged to continue to increase Activity as tolerated. 04/06/2024 Continue current medication regimen. Refilled Oxycodone  10/325 mg one tablet every 6 hours as needed for pain. #120.  We will continue the opioid monitoring program, this consists of regular clinic visits, examinations, urine drug screen, pill counts as well as use of [place]  Controlled Substance Reporting system. A 12 month History has been reviewed on the [place]  Controlled Substance Reporting System on 04/06/2024.  2. Lumbar Radiculitis: Continue current medication regimen with Gabapentin . 04/06/2024 3. Bilateral Knee Pain/ Degenerative: Continue Voltaren  Gel. Continue to Monitor. 04/06/2024 4. Severe diabetic poly neuropathy: Continue current medication regimen with Gabapentin . 01/ 21/2026 5. Insomnia: No complaints today. Continue to Monitor.  04/06/2024 6.Anxiety/Depression: Continue Current Medication regimen as prescribed by PCP. [name] 04/06/2024 7. De Quervains Tenosynovitis: No Complaints Today. 01/21//2026 8. Bilateral  Greater Trochanteric Bursitis: Continue with Ice and Heat Therapy. Continue to Monitor. 04/06/2024.  9. Cervicalgia/ Cervical Radiculitis: Continue Gabapentin . Continue with HEP as Tolerated and continue to monitor. 04/06/2024 10.  Chronic Bilateral Thoracic Back Pain:  Continue HEP as Tolerated. Continue current medication regimen. Continue to monitor. 04/06/2024. 11. Chronic Bilateral Shoulder Pain: Continue HEP as Tolerated. Continue to Monitor.  F/U in 1 month   "

## 2024-05-06 ENCOUNTER — Encounter: Admitting: Registered Nurse

## 2024-06-03 ENCOUNTER — Encounter: Admitting: Registered Nurse
